# Patient Record
Sex: Male | Born: 1935
Health system: Southern US, Community
[De-identification: ages and names within clinical notes are randomized; demographics above are authoritative.]

## PROBLEM LIST (undated history)

## (undated) DIAGNOSIS — R351 Nocturia: Secondary | ICD-10-CM

## (undated) DIAGNOSIS — N182 Chronic kidney disease, stage 2 (mild): Secondary | ICD-10-CM

## (undated) DIAGNOSIS — N3281 Overactive bladder: Secondary | ICD-10-CM

## (undated) DIAGNOSIS — I219 Acute myocardial infarction, unspecified: Secondary | ICD-10-CM

## (undated) DIAGNOSIS — Z951 Presence of aortocoronary bypass graft: Secondary | ICD-10-CM

## (undated) DIAGNOSIS — N210 Calculus in bladder: Secondary | ICD-10-CM

## (undated) DIAGNOSIS — N35919 Unspecified urethral stricture, male, unspecified site: Secondary | ICD-10-CM

## (undated) DIAGNOSIS — Z8669 Personal history of other diseases of the nervous system and sense organs: Secondary | ICD-10-CM

## (undated) DIAGNOSIS — I4819 Other persistent atrial fibrillation: Secondary | ICD-10-CM

## (undated) DIAGNOSIS — N390 Urinary tract infection, site not specified: Secondary | ICD-10-CM

## (undated) DIAGNOSIS — N200 Calculus of kidney: Secondary | ICD-10-CM

## (undated) DIAGNOSIS — E785 Hyperlipidemia, unspecified: Secondary | ICD-10-CM

## (undated) DIAGNOSIS — N3020 Other chronic cystitis without hematuria: Secondary | ICD-10-CM

## (undated) DIAGNOSIS — D649 Anemia, unspecified: Secondary | ICD-10-CM

## (undated) DIAGNOSIS — I10 Essential (primary) hypertension: Secondary | ICD-10-CM

## (undated) DIAGNOSIS — Z972 Presence of dental prosthetic device (complete) (partial): Secondary | ICD-10-CM

## (undated) DIAGNOSIS — Z9189 Other specified personal risk factors, not elsewhere classified: Secondary | ICD-10-CM

## (undated) DIAGNOSIS — Z87898 Personal history of other specified conditions: Secondary | ICD-10-CM

## (undated) DIAGNOSIS — K219 Gastro-esophageal reflux disease without esophagitis: Secondary | ICD-10-CM

## (undated) DIAGNOSIS — F0390 Unspecified dementia without behavioral disturbance: Secondary | ICD-10-CM

## (undated) DIAGNOSIS — I255 Ischemic cardiomyopathy: Secondary | ICD-10-CM

## (undated) DIAGNOSIS — K5790 Diverticulosis of intestine, part unspecified, without perforation or abscess without bleeding: Secondary | ICD-10-CM

## (undated) DIAGNOSIS — A419 Sepsis, unspecified organism: Secondary | ICD-10-CM

## (undated) DIAGNOSIS — I5022 Chronic systolic (congestive) heart failure: Secondary | ICD-10-CM

## (undated) DIAGNOSIS — Z974 Presence of external hearing-aid: Secondary | ICD-10-CM

## (undated) DIAGNOSIS — C4320 Malignant melanoma of unspecified ear and external auricular canal: Secondary | ICD-10-CM

## (undated) DIAGNOSIS — F03A0 Unspecified dementia, mild, without behavioral disturbance, psychotic disturbance, mood disturbance, and anxiety: Secondary | ICD-10-CM

## (undated) DIAGNOSIS — R001 Bradycardia, unspecified: Secondary | ICD-10-CM

## (undated) DIAGNOSIS — C61 Malignant neoplasm of prostate: Secondary | ICD-10-CM

## (undated) DIAGNOSIS — I2510 Atherosclerotic heart disease of native coronary artery without angina pectoris: Secondary | ICD-10-CM

## (undated) DIAGNOSIS — M1990 Unspecified osteoarthritis, unspecified site: Secondary | ICD-10-CM

## (undated) HISTORY — DX: Other persistent atrial fibrillation: I48.19

## (undated) HISTORY — DX: Sepsis, unspecified organism: A41.9

## (undated) HISTORY — DX: Urinary tract infection, site not specified: N39.0

## (undated) HISTORY — DX: Bradycardia, unspecified: R00.1

## (undated) HISTORY — DX: Atherosclerotic heart disease of native coronary artery without angina pectoris: I25.10

## (undated) HISTORY — PX: SKIN CANCER EXCISION: SHX779

## (undated) HISTORY — PX: CORONARY ARTERY BYPASS GRAFT: SHX141

## (undated) HISTORY — DX: Hyperlipidemia, unspecified: E78.5

## (undated) HISTORY — PX: CARDIOVASCULAR STRESS TEST: SHX262

## (undated) HISTORY — PX: MELANOMA EXCISION: SHX5266

## (undated) HISTORY — DX: Chronic kidney disease, stage 2 (mild): N18.2

## (undated) HISTORY — DX: Essential (primary) hypertension: I10

## (undated) HISTORY — PX: CARDIAC CATHETERIZATION: SHX172

## (undated) HISTORY — DX: Chronic systolic (congestive) heart failure: I50.22

---
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
## 1996-03-28 DIAGNOSIS — C61 Malignant neoplasm of prostate: Secondary | ICD-10-CM

## 1996-03-28 HISTORY — PX: OTHER SURGICAL HISTORY: SHX169

## 1996-03-28 HISTORY — DX: Malignant neoplasm of prostate: C61

## 1997-07-28 ENCOUNTER — Other Ambulatory Visit: Admission: RE | Admit: 1997-07-28 | Discharge: 1997-07-28 | Payer: Self-pay | Admitting: Cardiology

## 2004-02-27 ENCOUNTER — Ambulatory Visit: Payer: Self-pay | Admitting: Internal Medicine

## 2004-08-27 ENCOUNTER — Ambulatory Visit: Payer: Self-pay | Admitting: Internal Medicine

## 2005-02-21 ENCOUNTER — Ambulatory Visit: Payer: Self-pay | Admitting: Internal Medicine

## 2005-02-22 ENCOUNTER — Inpatient Hospital Stay (HOSPITAL_BASED_OUTPATIENT_CLINIC_OR_DEPARTMENT_OTHER): Admission: RE | Admit: 2005-02-22 | Discharge: 2005-02-22 | Payer: Self-pay | Admitting: Cardiology

## 2005-03-28 HISTORY — PX: CATARACT EXTRACTION W/ INTRAOCULAR LENS  IMPLANT, BILATERAL: SHX1307

## 2005-04-26 ENCOUNTER — Ambulatory Visit: Payer: Self-pay | Admitting: Internal Medicine

## 2005-09-01 ENCOUNTER — Ambulatory Visit: Payer: Self-pay | Admitting: Internal Medicine

## 2005-12-22 ENCOUNTER — Ambulatory Visit: Payer: Self-pay | Admitting: Internal Medicine

## 2005-12-23 ENCOUNTER — Inpatient Hospital Stay (HOSPITAL_COMMUNITY): Admission: EM | Admit: 2005-12-23 | Discharge: 2005-12-25 | Payer: Self-pay | Admitting: Emergency Medicine

## 2005-12-23 ENCOUNTER — Encounter (INDEPENDENT_AMBULATORY_CARE_PROVIDER_SITE_OTHER): Payer: Self-pay | Admitting: Specialist

## 2005-12-23 HISTORY — PX: LAPAROSCOPIC CHOLECYSTECTOMY: SUR755

## 2006-10-26 ENCOUNTER — Ambulatory Visit: Payer: Self-pay | Admitting: Internal Medicine

## 2006-10-26 DIAGNOSIS — Z8546 Personal history of malignant neoplasm of prostate: Secondary | ICD-10-CM | POA: Insufficient documentation

## 2006-10-26 DIAGNOSIS — Z87442 Personal history of urinary calculi: Secondary | ICD-10-CM | POA: Insufficient documentation

## 2006-10-26 DIAGNOSIS — I2510 Atherosclerotic heart disease of native coronary artery without angina pectoris: Secondary | ICD-10-CM | POA: Insufficient documentation

## 2006-10-26 DIAGNOSIS — G510 Bell's palsy: Secondary | ICD-10-CM | POA: Insufficient documentation

## 2007-01-01 ENCOUNTER — Ambulatory Visit: Payer: Self-pay | Admitting: Internal Medicine

## 2007-01-01 DIAGNOSIS — R109 Unspecified abdominal pain: Secondary | ICD-10-CM | POA: Insufficient documentation

## 2007-02-07 ENCOUNTER — Ambulatory Visit: Payer: Self-pay | Admitting: Internal Medicine

## 2007-02-07 DIAGNOSIS — M1990 Unspecified osteoarthritis, unspecified site: Secondary | ICD-10-CM | POA: Insufficient documentation

## 2007-02-07 LAB — CONVERTED CEMR LAB
ALT: 18 units/L (ref 0–53)
AST: 27 units/L (ref 0–37)
Albumin: 3.9 g/dL (ref 3.5–5.2)
Alkaline Phosphatase: 69 units/L (ref 39–117)
BUN: 16 mg/dL (ref 6–23)
Basophils Absolute: 0 10*3/uL (ref 0.0–0.1)
Basophils Relative: 0.6 % (ref 0.0–1.0)
Bilirubin, Direct: 0.3 mg/dL (ref 0.0–0.3)
CO2: 29 meq/L (ref 19–32)
Calcium: 9.5 mg/dL (ref 8.4–10.5)
Chloride: 109 meq/L (ref 96–112)
Cholesterol: 157 mg/dL (ref 0–200)
Creatinine, Ser: 1 mg/dL (ref 0.4–1.5)
Eosinophils Absolute: 0.1 10*3/uL (ref 0.0–0.6)
Eosinophils Relative: 1.9 % (ref 0.0–5.0)
GFR calc Af Amer: 93 mL/min
GFR calc non Af Amer: 77 mL/min
Glucose, Bld: 93 mg/dL (ref 70–99)
HCT: 44.7 % (ref 39.0–52.0)
HDL: 45.9 mg/dL (ref >39.0)
Hemoglobin: 15.5 g/dL (ref 13.0–17.0)
LDL Cholesterol: 102 mg/dL — ABNORMAL HIGH (ref 0–99)
Lymphocytes Relative: 16.8 % (ref 12.0–46.0)
MCHC: 34.7 g/dL (ref 30.0–36.0)
MCV: 90 fL (ref 78.0–100.0)
Monocytes Absolute: 0.8 10*3/uL — ABNORMAL HIGH (ref 0.2–0.7)
Monocytes Relative: 12.6 % — ABNORMAL HIGH (ref 3.0–11.0)
Neutro Abs: 4.1 10*3/uL (ref 1.4–7.7)
Neutrophils Relative %: 68.1 % (ref 43.0–77.0)
PSA: 0.01 ng/mL — ABNORMAL LOW (ref 0.10–4.00)
Platelets: 104 10*3/uL — ABNORMAL LOW (ref 150–400)
Potassium: 4.9 meq/L (ref 3.5–5.1)
RBC: 4.97 M/uL (ref 4.22–5.81)
RDW: 13.6 % (ref 11.5–14.6)
Sodium: 145 meq/L (ref 135–145)
TSH: 2.2 microintl units/mL (ref 0.35–5.50)
Total Bilirubin: 1.3 mg/dL — ABNORMAL HIGH (ref 0.3–1.2)
Total CHOL/HDL Ratio: 3.4
Total Protein: 6.4 g/dL (ref 6.0–8.3)
Triglycerides: 44 mg/dL (ref 0–149)
VLDL: 9 mg/dL (ref 0–40)
WBC: 6 10*3/uL (ref 4.5–10.5)

## 2007-02-19 ENCOUNTER — Telehealth: Payer: Self-pay | Admitting: Internal Medicine

## 2007-02-27 ENCOUNTER — Ambulatory Visit: Payer: Self-pay | Admitting: Internal Medicine

## 2007-02-27 DIAGNOSIS — L02619 Cutaneous abscess of unspecified foot: Secondary | ICD-10-CM | POA: Insufficient documentation

## 2007-02-27 DIAGNOSIS — L03119 Cellulitis of unspecified part of limb: Secondary | ICD-10-CM

## 2007-03-05 ENCOUNTER — Telehealth: Payer: Self-pay | Admitting: Internal Medicine

## 2007-03-06 ENCOUNTER — Ambulatory Visit: Payer: Self-pay | Admitting: Internal Medicine

## 2007-03-19 ENCOUNTER — Telehealth (INDEPENDENT_AMBULATORY_CARE_PROVIDER_SITE_OTHER): Payer: Self-pay | Admitting: *Deleted

## 2007-03-30 ENCOUNTER — Encounter: Payer: Self-pay | Admitting: Internal Medicine

## 2007-06-19 ENCOUNTER — Ambulatory Visit: Payer: Self-pay | Admitting: Internal Medicine

## 2007-10-10 ENCOUNTER — Encounter: Payer: Self-pay | Admitting: Internal Medicine

## 2007-10-18 ENCOUNTER — Ambulatory Visit: Payer: Self-pay | Admitting: Internal Medicine

## 2007-10-18 DIAGNOSIS — I10 Essential (primary) hypertension: Secondary | ICD-10-CM | POA: Insufficient documentation

## 2007-10-18 DIAGNOSIS — E7800 Pure hypercholesterolemia, unspecified: Secondary | ICD-10-CM | POA: Insufficient documentation

## 2009-02-10 ENCOUNTER — Telehealth: Payer: Self-pay | Admitting: Internal Medicine

## 2009-02-10 ENCOUNTER — Ambulatory Visit: Payer: Self-pay | Admitting: Internal Medicine

## 2009-02-10 DIAGNOSIS — R6883 Chills (without fever): Secondary | ICD-10-CM | POA: Insufficient documentation

## 2009-02-10 LAB — CONVERTED CEMR LAB
ALT: 24 units/L (ref 0–53)
AST: 34 units/L (ref 0–37)
Albumin: 4.5 g/dL (ref 3.5–5.2)
Alkaline Phosphatase: 58 units/L (ref 39–117)
BUN: 19 mg/dL (ref 6–23)
Basophils Absolute: 0 10*3/uL (ref 0.0–0.1)
Basophils Relative: 0 % (ref 0–1)
Bilirubin, Direct: 0.2 mg/dL (ref 0.0–0.3)
CO2: 23 meq/L (ref 19–32)
Calcium: 9.4 mg/dL (ref 8.4–10.5)
Chloride: 102 meq/L (ref 96–112)
Creatinine, Ser: 1.04 mg/dL (ref 0.40–1.50)
Eosinophils Absolute: 0.1 10*3/uL (ref 0.0–0.7)
Eosinophils Relative: 1 % (ref 0–5)
Glucose, Bld: 85 mg/dL (ref 70–99)
HCT: 44.6 % (ref 39.0–52.0)
Hemoglobin: 14.8 g/dL (ref 13.0–17.0)
Indirect Bilirubin: 0.7 mg/dL (ref 0.0–0.9)
Lymphocytes Relative: 10 % — ABNORMAL LOW (ref 12–46)
Lymphs Abs: 0.8 10*3/uL (ref 0.7–4.0)
MCHC: 33.2 g/dL (ref 30.0–36.0)
MCV: 90.7 fL (ref 78.0–100.0)
Monocytes Absolute: 1 10*3/uL (ref 0.1–1.0)
Monocytes Relative: 13 % — ABNORMAL HIGH (ref 3–12)
Neutro Abs: 5.9 10*3/uL (ref 1.7–7.7)
Neutrophils Relative %: 76 % (ref 43–77)
Platelets: 98 10*3/uL — ABNORMAL LOW (ref 150–400)
Potassium: 4.3 meq/L (ref 3.5–5.3)
RBC: 4.92 M/uL (ref 4.22–5.81)
RDW: 13.8 % (ref 11.5–15.5)
Sodium: 140 meq/L (ref 135–145)
Total Bilirubin: 0.9 mg/dL (ref 0.3–1.2)
Total Protein: 6.9 g/dL (ref 6.0–8.3)
WBC: 7.8 10*3/uL (ref 4.0–10.5)

## 2009-02-11 ENCOUNTER — Telehealth: Payer: Self-pay | Admitting: Internal Medicine

## 2009-02-11 ENCOUNTER — Encounter: Payer: Self-pay | Admitting: Internal Medicine

## 2009-09-04 ENCOUNTER — Ambulatory Visit: Payer: Self-pay | Admitting: Internal Medicine

## 2009-09-04 DIAGNOSIS — R071 Chest pain on breathing: Secondary | ICD-10-CM | POA: Insufficient documentation

## 2010-03-31 ENCOUNTER — Ambulatory Visit: Payer: Self-pay | Admitting: Cardiology

## 2010-04-18 ENCOUNTER — Encounter: Payer: Self-pay | Admitting: Cardiology

## 2010-04-29 ENCOUNTER — Ambulatory Visit (INDEPENDENT_AMBULATORY_CARE_PROVIDER_SITE_OTHER): Payer: Medicare Other | Admitting: *Deleted

## 2010-04-29 DIAGNOSIS — I2510 Atherosclerotic heart disease of native coronary artery without angina pectoris: Secondary | ICD-10-CM

## 2010-04-29 NOTE — Assessment & Plan Note (Signed)
Summary: chills/body aches/throbbing in head/cjr   Vital Signs:  Patient profile:   77 year old male Weight:      164 pounds BMI:     26.57 Temp:     98.3 degrees F oral Pulse rate:   60 / minute Pulse rhythm:   regular BP sitting:   120 / 68  (left arm) Cuff size:   regular  Vitals Entered By: Chipper Oman, RN (February 10, 2009 2:41 PM) CC: Episode of chills, head throbbing, violent shaking and weakness last night. Wife witnessed, States had episode like this before. Today weak, sore all over and head hurts.   CC:  Episode of chills, head throbbing, violent shaking and weakness last night. Wife witnessed, States had episode like this before. Today weak, and sore all over and head hurts..  History of Present Illness: 77 year old patient has a history of coronary artery disease, who experienced an episode of severe rigors last night that lasted about one hour.  Temperature was only 99 degrees today.  He complains of some weakness and fairly mild myalgias, but otherwise no complaints.  He had no further documented fever or chills.  Denies any cough or shortness of breath.  He states at approximately 1 month ago.  He had a similar episode of chills.  Denies any chest pain.  He remains on Crestor for dyslipidemia.  Allergies: 1)  ! Cipro 2)  ! Sulfa  Past History:  Past Medical History: Reviewed history from 02/07/2007 and no changes required. Prostate cancer, hx of 1998 Coronary artery disease status post coronary bypass grafting, 1989, 1998 DJD Hypercholesterolemia Nephrolithiasis, hx of Osteoarthritis  Past Surgical History: Reviewed history from 06/19/2007 and no changes required. Coronary artery bypass graft-1989, 1998 status post resection melanoma of the year status post laparoscopic cholecystectomy September 2007 Cataract extraction cardiac catheterization in November 2006 sigmoidoscopy 2006  Review of Systems       The patient complains of fever and muscle  weakness.  The patient denies anorexia, weight loss, weight gain, vision loss, decreased hearing, hoarseness, chest pain, syncope, dyspnea on exertion, peripheral edema, prolonged cough, headaches, hemoptysis, abdominal pain, melena, hematochezia, severe indigestion/heartburn, hematuria, incontinence, genital sores, suspicious skin lesions, transient blindness, difficulty walking, depression, unusual weight change, abnormal bleeding, enlarged lymph nodes, angioedema, breast masses, and testicular masses.    Physical Exam  General:  Well-developed,well-nourished,in no acute distress; alert,appropriate and cooperative throughout examination; 110/70 Head:  Normocephalic and atraumatic without obvious abnormalities. No apparent alopecia or balding. Eyes:  No corneal or conjunctival inflammation noted. EOMI. Perrla. Funduscopic exam benign, without hemorrhages, exudates or papilledema. Vision grossly normal. Ears:  External ear exam shows no significant lesions or deformities.  Otoscopic examination reveals clear canals, tympanic membranes are intact bilaterally without bulging, retraction, inflammation or discharge. Hearing is grossly normal bilaterally. Mouth:  Oral mucosa and oropharynx without lesions or exudates.  Teeth in good repair. Neck:  No deformities, masses, or tenderness noted. Chest Wall:  sternotomy scar Lungs:  Normal respiratory effort, chest expands symmetrically. Lungs are clear to auscultation, no crackles or wheezes O2 saturation 97% Heart:  Normal rate and regular rhythm. S1 and S2 normal without gallop, murmur, click, rub or other extra sounds. pulse rate 54 Abdomen:  Bowel sounds positive,abdomen soft and non-tender without masses, organomegaly or hernias noted. Msk:  No deformity or scoliosis noted of thoracic or lumbar spine.   Pulses:  R and L carotid,radial,femoral,dorsalis pedis and posterior tibial pulses are full and equal bilaterally Extremities:  No  clubbing, cyanosis,  edema, or deformity noted with normal full range of motion of all joints.     Impression & Recommendations:  Problem # 1:  CHILLS WITHOUT FEVER (ICD-780.64)  Orders: Venipuncture [id number]) TLB-BMP (Basic Metabolic Panel-BMET) (99991111) TLB-CBC Platelet - w/Differential (85025-CBCD) TLB-Hepatic/Liver Function Pnl (80076-HEPATIC) T-Culture, Blood Routine [id number]) UA Dipstick W/ Micro (manual) (81000) T-2 View CXR (71020TC)  Problem # 2:  HYPERTENSION NEC (ICD-997.91)  Orders: Venipuncture [id number]) TLB-BMP (Basic Metabolic Panel-BMET) (99991111) TLB-CBC Platelet - w/Differential (85025-CBCD) TLB-Hepatic/Liver Function Pnl (80076-HEPATIC)  Problem # 3:  HYPERCHOLESTEROLEMIA (ICD-272.0)  His updated medication list for this problem includes:    Crestor 10 Mg Tabs (Rosuvastatin calcium) .Marland Kitchen... 1 once daily  Orders: Venipuncture [id number]) TLB-BMP (Basic Metabolic Panel-BMET) (99991111) TLB-CBC Platelet - w/Differential (85025-CBCD) TLB-Hepatic/Liver Function Pnl (80076-HEPATIC)  Complete Medication List: 1)  Lisinopril 20 Mg Tabs (Lisinopril) .... Take 1 tablet by mouth once a day 2)  Imdur 30 Mg Tb24 (Isosorbide mononitrate) .... Take 1 tablet by mouth once a day 3)  Galantine Hydrobromide 16mg   .... 1 once daily 4)  [facility]vs Omeprazole 20 Mg Tbec (Omeprazole) .Marland Kitchen.. 1 once daily 5)  Hydrocodone-acetaminophen 5-500 Mg Tabs (Hydrocodone-acetaminophen) .... One every 6 hrs as needed for pain 6)  Buffered Aspirin 325 Mg Tabs (Aspirin buf(cacarb-mgcarb-mgo)) .Marland Kitchen.. 1 once daily 7)  Crestor 10 Mg Tabs (Rosuvastatin calcium) .Marland Kitchen.. 1 once daily  Patient Instructions: 1)  Take 650-1000mg  of Tylenol every 4-6 hours as needed for relief of pain or comfort of fever AVOID taking more than 4000mg   in a 24 hour period (can cause liver damage in higher doses). 2)  call if there are any further episodes of fever or chills Prescriptions: CRESTOR 10 MG  TABS (ROSUVASTATIN CALCIUM) 1 once  daily  #90 x 6   Entered and Authorized by:   Marletta Lor  MD   Signed by:   Marletta Lor  MD on 02/10/2009   Method used:   Print then Give to Patient   RxID:   [id number] HYDROCODONE-ACETAMINOPHEN 5-500 MG  TABS (HYDROCODONE-ACETAMINOPHEN) one every 6 hrs as needed for pain  #50 x 0   Entered and Authorized by:   Marletta Lor  MD   Signed by:   Marletta Lor  MD on 02/10/2009   Method used:   Print then Give to Patient   RxID:   [id number] [facility] OMEPRAZOLE 20 MG  TBEC (OMEPRAZOLE) 1 once daily  #90 x 6   Entered and Authorized by:   Marletta Lor  MD   Signed by:   Marletta Lor  MD on 02/10/2009   Method used:   Print then Give to Patient   RxID:   [id number] IMDUR 30 MG  TB24 (ISOSORBIDE MONONITRATE) Take 1 tablet by mouth once a day  #90 x 6   Entered and Authorized by:   Marletta Lor  MD   Signed by:   Marletta Lor  MD on 02/10/2009   Method used:   Print then Give to Patient   RxID:   [id number] LISINOPRIL 20 MG  TABS (LISINOPRIL) Take 1 tablet by mouth once a day  #90 x 6   Entered and Authorized by:   Marletta Lor  MD   Signed by:   Marletta Lor  MD on 02/10/2009   Method used:   Print then Give to Patient   RxID:   [id number]   Appended Document: chills/body aches/throbbing in head/cjr  Laboratory  Results   Urine Tests    Routine Urinalysis   Color: yellow Appearance: Clear Glucose: negative   (Normal Range: Negative) Bilirubin: negative   (Normal Range: Negative) Ketone: 1+   (Normal Range: Negative) Spec. Gravity: 1.020   (Normal Range: 1.003-1.035) Blood: trace-intact   (Normal Range: Negative) pH: 5.5   (Normal Range: 5.0-8.0) Protein: negative   (Normal Range: Negative) Urobilinogen: 1.0   (Normal Range: 0-1) Nitrite: negative   (Normal Range: Negative) Leukocyte Esterace: 2+   (Normal Range: Negative)    Comments: ? if enough to culture. Irish Elders  CMA  February 10, 2009 4:26 PM

## 2010-04-29 NOTE — Assessment & Plan Note (Signed)
Summary: CPX//SAH   Vital Signs:  Patient Profile:   77 Years Old Male Height:     66 inches Weight:      166 pounds BMI:     26.89 Temp:     97.6 degrees F oral BP sitting:   122 / 66  (left arm)  Vitals Entered By: Chipper Oman, RN (February 07, 2007 9:31 AM)                 Chief Complaint:  OV and fasting. Sees cardiologist..  History of Present Illness: 77 year old patient seen today for an annual exam clinically doing quite well.  Has a history of coronary artery disease, followed by Dr. tenet.  No cardiopulmonary complaints  Current Allergies: ! CIPRO ! SULFA  Past Medical History:    Prostate cancer, hx of 1998    Coronary artery disease status post coronary bypass grafting, 1989, 1998    DJD    Hypercholesterolemia    Nephrolithiasis, hx of    Osteoarthritis  Past Surgical History:    Coronary artery bypass graft-1989, 1998    status post resection melanoma of the year    status post laparoscopic cholecystectomy September 2007    Cataract extraction   Family History:    Family History Diabetes 1st degree relative    Fam hx MI    Fam hx CAD    father died age 35.  History tuberculosis and diabetes    mother died age 76 on artery disease, diabetes        One brother died age 42 of an MI    4 sisters positive for coronary disease    Review of Systems       sigmoidoscopy 2006 Cardiolite  stress test November 2005 heart catheterization November 2006   Physical Exam  General:     Well-developed,well-nourished,in no acute distress; alert,appropriate and cooperative throughout examination Head:     Normocephalic and atraumatic without obvious abnormalities. No apparent alopecia or balding. Eyes:     No corneal or conjunctival inflammation noted. EOMI. Perrla. Funduscopic exam benign, without hemorrhages, exudates or papilledema. Vision grossly normal. Ears:     hearing aid in place on the right Nose:     External nasal examination shows no  deformity or inflammation. Nasal mucosa are pink and moist without lesions or exudates. Mouth:     Oral mucosa and oropharynx without lesions or exudates.  Teeth in good repair. Neck:     No deformities, masses, or tenderness noted. Chest Wall:     No deformities, masses, tenderness or gynecomastia noted. Breasts:     No masses or gynecomastia noted Lungs:     Normal respiratory effort, chest expands symmetrically. Lungs are clear to auscultation, no crackles or wheezes. Heart:     Normal rate and regular rhythm. S1 and S2 normal without gallop, murmur, click, rub or other extra sounds. Abdomen:     Bowel sounds positive,abdomen soft and non-tender without masses, organomegaly or hernias noted. Rectal:     No external abnormalities noted. Normal sphincter tone. No rectal masses or tenderness. Genitalia:     Testes bilaterally descended without nodularity, tenderness or masses. No scrotal masses or lesions. No penis lesions or urethral discharge. Prostate:     plus one Msk:     No deformity or scoliosis noted of thoracic or lumbar spine.   Pulses:     R and L carotid,radial,femoral,dorsalis pedis and posterior tibial pulses are full and equal bilaterally Extremities:  No clubbing, cyanosis, edema, or deformity noted with normal full range of motion of all joints.   Neurologic:     No cranial nerve deficits noted. Station and gait are normal. Plantar reflexes are down-going bilaterally. DTRs are symmetrical throughout. Sensory, motor and coordinative functions appear intact. Skin:     Intact without suspicious lesions or rashes Cervical Nodes:     No lymphadenopathy noted Axillary Nodes:     No palpable lymphadenopathy Inguinal Nodes:     No significant adenopathy Psych:     Cognition and judgment appear intact. Alert and cooperative with normal attention span and concentration. No apparent delusions, illusions, hallucinations    Impression & Recommendations:  Problem #  1:  OSTEOARTHRITIS (ICD-715.90)  His updated medication list for this problem includes:    Hydrocodone-acetaminophen 5-500 Mg Tabs (Hydrocodone-acetaminophen) ..... One every 6 hrs as needed for pain    Buffered Aspirin 325 Mg Tabs (Aspirin buf(cacarb-mgcarb-mgo)) .Marland Kitchen... 1 once daily   Problem # 2:  BELL'S PALSY, RIGHT (ICD-351.0)  Problem # 3:  PROSTATE CANCER, HX OF (ICD-V10.46)  Orders: DRE [id number]) TLB-PSA (Prostate Specific Antigen) (84153-PSA)   Problem # 4:  CORONARY ARTERY DISEASE (ICD-414.00)  His updated medication list for this problem includes:    Lisinopril 20 Mg Tabs (Lisinopril) .Marland Kitchen... Take 1 tablet by mouth once a day    Imdur 30 Mg Tb24 (Isosorbide mononitrate) .Marland Kitchen... Take 1 tablet by mouth once a day    Buffered Aspirin 325 Mg Tabs (Aspirin buf(cacarb-mgcarb-mgo)) .Marland Kitchen... 1 once daily  Orders: Venipuncture [id number]) TLB-Lipid Panel (80061-LIPID) TLB-BMP (Basic Metabolic Panel-BMET) (99991111) TLB-CBC Platelet - w/Differential (85025-CBCD) TLB-Hepatic/Liver Function Pnl (80076-HEPATIC) TLB-TSH (Thyroid Stimulating Hormone) (84443-TSH)   Complete Medication List: 1)  Lisinopril 20 Mg Tabs (Lisinopril) .... Take 1 tablet by mouth once a day 2)  Imdur 30 Mg Tb24 (Isosorbide mononitrate) .... Take 1 tablet by mouth once a day 3)  Galantine Hydrobromide 16mg   .... 1 once daily 4)  [facility]vs Omeprazole 20 Mg Tbec (Omeprazole) .Marland Kitchen.. 1 once daily 5)  Crestor 5 Mg Tabs (Rosuvastatin calcium) .... One daily 6)  Hydrocodone-acetaminophen 5-500 Mg Tabs (Hydrocodone-acetaminophen) .... One every 6 hrs as needed for pain 7)  Buffered Aspirin 325 Mg Tabs (Aspirin buf(cacarb-mgcarb-mgo)) .Marland Kitchen.. 1 once daily 8)  Mag-oxide 400 Mg Tabs (Magnesium oxide) .... One daily as needed   Patient Instructions: 1)  Please schedule a follow-up appointment in 4 months. 2)  Limit your Sodium (Salt). 3)  It is important that you exercise regularly at least 20 minutes 5 times a week. If you develop  chest pain, have severe difficulty breathing, or feel very tired , stop exercising immediately and seek medical attention.    Prescriptions: MAG-OXIDE 400 MG  TABS (MAGNESIUM OXIDE) one daily as needed  #90 x 6   Entered and Authorized by:   Marletta Lor  MD   Signed by:   Marletta Lor  MD on 02/07/2007   Method used:   Print then Give to Patient   RxID:   [id number] CRESTOR 5 MG  TABS (ROSUVASTATIN CALCIUM) one daily  #90 x 6   Entered and Authorized by:   Marletta Lor  MD   Signed by:   Marletta Lor  MD on 02/07/2007   Method used:   Print then Give to Patient   RxID:   [id number] [facility] OMEPRAZOLE 20 MG  TBEC (OMEPRAZOLE) 1 once daily  #90 x 6   Entered and Authorized by:  Marletta Lor  MD   Signed by:   Marletta Lor  MD on 02/07/2007   Method used:   Print then Give to Patient   RxID:   [id number] IMDUR 30 MG  TB24 (ISOSORBIDE MONONITRATE) Take 1 tablet by mouth once a day  #90 x 6   Entered and Authorized by:   Marletta Lor  MD   Signed by:   Marletta Lor  MD on 02/07/2007   Method used:   Print then Give to Patient   RxID:   [id number] LISINOPRIL 20 MG  TABS (LISINOPRIL) Take 1 tablet by mouth once a day  #90 x 6   Entered and Authorized by:   Marletta Lor  MD   Signed by:   Marletta Lor  MD on 02/07/2007   Method used:   Print then Give to Patient   RxID:   [id number]  ]

## 2010-04-29 NOTE — Progress Notes (Signed)
Summary: Pt called req results from labs, specimen and xray  Phone Note Call from Patient Call back at Home Phone [phone]   Caller: Patient Summary of Call: Pt called and is req results from labs,specimen and xray. Please call asap.  Initial call taken by: Braulio Bosch,  February 11, 2009 3:54 PM  Follow-up for Phone Call        called Follow-up by: Marletta Lor  MD,  February 12, 2009 1:10 PM

## 2010-04-29 NOTE — Progress Notes (Signed)
Summary: Sent pt. to [facility] for labs  Phone Note Other Incoming   Summary of Call: Sentpt. to [facility] to have all labs drawn, pt. was dehydrated and twice unsuccessful blood draw. Gave instructions and numbers, [facility] is open until 8pm, pt. and wife will go to [facility] for X-ray first. Informed to call with any questions or concerns. Irish Elders CMA  February 10, 2009 4:18 PM   Follow-up for Phone Call        noted Follow-up by: Marletta Lor  MD,  February 10, 2009 5:23 PM

## 2010-04-29 NOTE — Assessment & Plan Note (Signed)
Summary: FUP ON BELL'S PALSY/CCM   Vital Signs:  Patient Profile:   77 Years Old Male Weight:      169 pounds Temp:     97.9 degrees F oral BP sitting:   110 / 80  (left arm)  Vitals Entered By: Chipper Oman, RN (October 26, 2006 1:04 PM)               Chief Complaint:  ROV. Was seen at Urgent Care for Bell's palsy. Took Prednisone. Face still numb.Marland Kitchen  History of Present Illness: 77 year old patient seen today for follow-up.  He was treated for a Bell's palsy and the urgent care Center recently has completed a course of prednisone.  He states he present with some right ear discomfort and some excessive tearing involve the right eye.  Denies any facial weakness.  At this time.  Patient had the surgery for acute cholecystitis in September of last year.  He has done well and is not any difficulty since that time since his gallbladder surgery.  He has done well off his omeprazole and has had no significant reflux symptoms  Current Allergies: ! CIPRO ! SULFA  Past Medical History:    Reviewed history from 10/26/2006 and no changes required:       Prostate cancer, hx of       Coronary artery disease       DJD       Hypercholesterolemia       Nephrolithiasis, hx of   Family History:    Reviewed history from 10/26/2006 and no changes required:       Family History Diabetes 1st degree relative       Fam hx MI       Fam hx CAD    Review of Systems  The patient denies anorexia, fever, weight loss, vision loss, decreased hearing, hoarseness, chest pain, syncope, dyspnea on exhertion, peripheral edema, prolonged cough, hemoptysis, abdominal pain, melena, hematochezia, severe indigestion/heartburn, hematuria, incontinence, genital sores, muscle weakness, suspicious skin lesions, transient blindness, difficulty walking, depression, unusual weight change, abnormal bleeding, enlarged lymph nodes, angioedema, breast masses, and testicular masses.     Physical Exam  General:  Well-developed,well-nourished,in no acute distress; alert,appropriate and cooperative throughout examination Head:     Normocephalic and atraumatic without obvious abnormalities. No apparent alopecia or balding. Eyes:     No corneal or conjunctival inflammation noted. EOMI. Perrla. Funduscopic exam benign, without hemorrhages, exudates or papilledema. Vision grossly normal. Ears:     External ear exam shows no significant lesions or deformities.  Otoscopic examination reveals clear canals, tympanic membranes are intact bilaterally without bulging, retraction, inflammation or discharge. Hearing is grossly normal bilaterally. Mouth:     Oral mucosa and oropharynx without lesions or exudates.  Teeth in good repair. Neck:     No deformities, masses, or tenderness noted. Lungs:     Normal respiratory effort, chest expands symmetrically. Lungs are clear to auscultation, no crackles or wheezes. Heart:     Normal rate and regular rhythm. S1 and S2 normal without gallop, murmur, click, rub or other extra sounds. Abdomen:     Bowel sounds positive,abdomen soft and non-tender without masses, organomegaly or hernias noted. Msk:     No deformity or scoliosis noted of thoracic or lumbar spine.   Pulses:     R and L carotid,radial,femoral,dorsalis pedis and posterior tibial pulses are full and equal bilaterally Extremities:     No clubbing, cyanosis, edema, or deformity noted with normal full range  of motion of all joints.   Neurologic:     patient had no facial asymmetry.  he was able to blink normal bilaterally.  Is able to wrinkle his for it without difficulty Skin:     Intact without suspicious lesions or rashes    Impression & Recommendations:  Problem # 1:  BELL'S PALSY, RIGHT (ICD-351.0) apparent full clinical recovery  Problem # 2:  CORONARY ARTERY DISEASE (ICD-414.00)  His updated medication list for this problem includes:    Lisinopril 20 Mg Tabs (Lisinopril) .Marland Kitchen... Take 1 tablet by  mouth once a day    Imdur 30 Mg Tb24 (Isosorbide mononitrate) .Marland Kitchen... Take 1 tablet by mouth once a day   Problem # 3:  PROSTATE CANCER, HX OF (ICD-V10.46)  Problem # 4:  NEPHROLITHIASIS, HX OF (ICD-V13.01)  Complete Medication List: 1)  Lipitor 40 Mg Tabs (Atorvastatin calcium) .... Take 1 tablet by mouth once a day 2)  Lisinopril 20 Mg Tabs (Lisinopril) .... Take 1 tablet by mouth once a day 3)  Imdur 30 Mg Tb24 (Isosorbide mononitrate) .... Take 1 tablet by mouth once a day 4)  Galantine Hydrobromide 16mg     Patient Instructions: 1)  Please schedule a follow-up appointment in 3 months for a complete exam          Appended Document: FUP ON BELL'S PALSY/CCM   Tetanus/Td Immunization History:    Tetanus/Td # 1:  Td (10/26/2006)

## 2010-04-29 NOTE — Progress Notes (Signed)
Summary: results of blood test  Phone Note Call from Patient   Caller: patient (live) Call For: k Summary of Call: reuslts of blood test  Initial call taken by: Shanon Payor,  February 19, 2007 8:50 AM  Follow-up for Phone Call        Phone Call Completed Follow-up by: Chipper Oman, RN,  February 19, 2007 9:35 AM

## 2010-04-29 NOTE — Assessment & Plan Note (Signed)
Summary: foot pain/ccm   Vital Signs:  Patient Profile:   77 Years Old Male Height:     66 inches Weight:      167 pounds Temp:     97.8 degrees F oral BP sitting:   100 / 64  (left arm)  Vitals Entered By: Chipper Oman, RN (February 27, 2007 9:57 AM)                 Chief Complaint:  C/o L foot pain and swelling since Friday.Marland Kitchen  History of Present Illness: 77 year old, white male presents with a 4-day history of increasing redness, pain and swelling about the dorsal aspect of his left foot.  Pain is constant and bothers him throughout the night.  He has noted increasing swelling today.  Denies any fever or chills  Current Allergies: ! CIPRO ! SULFA      Physical Exam  Msk:     there is erythema, excessive warmth, and soft tissue swelling involving the dorsal aspect of his left foot, plantar flexion of the toes did not tend to aggravate the pain and the pain did not seem localized to the MTP joints    Impression & Recommendations:  Problem # 1:  CELLULITIS, FOOT, LEFT (ICD-682.7)  His updated medication list for this problem includes:    Cephalexin 500 Mg Caps (Cephalexin) ..... One twice daily   Complete Medication List: 1)  Lisinopril 20 Mg Tabs (Lisinopril) .... Take 1 tablet by mouth once a day 2)  Imdur 30 Mg Tb24 (Isosorbide mononitrate) .... Take 1 tablet by mouth once a day 3)  Galantine Hydrobromide 16mg   .... 1 once daily 4)  [facility]vs Omeprazole 20 Mg Tbec (Omeprazole) .Marland Kitchen.. 1 once daily 5)  Crestor 5 Mg Tabs (Rosuvastatin calcium) .... One daily 6)  Hydrocodone-acetaminophen 5-500 Mg Tabs (Hydrocodone-acetaminophen) .... One every 6 hrs as needed for pain 7)  Buffered Aspirin 325 Mg Tabs (Aspirin buf(cacarb-mgcarb-mgo)) .Marland Kitchen.. 1 once daily 8)  Mag-oxide 400 Mg Tabs (Magnesium oxide) .... One daily as needed 9)  Cephalexin 500 Mg Caps (Cephalexin) .... One twice daily   Patient Instructions: 1)  keep left leg elevated as much as possible 2)  call if   any worsening pain, swelling, or redness    Prescriptions: CEPHALEXIN 500 MG  CAPS (CEPHALEXIN) one twice daily  #14 x 0   Entered and Authorized by:   Marletta Lor  MD   Signed by:   Marletta Lor  MD on 02/27/2007   Method used:   Print then Give to Patient   RxID:   [id number]  ]

## 2010-04-29 NOTE — Assessment & Plan Note (Signed)
Summary: shoulder pain/dm   Vital Signs:  Patient profile:   77 year old male Weight:      163 pounds Temp:     98.1 degrees F oral BP sitting:   100 / 62  (left arm) Cuff size:   regular  Vitals Entered By: Cay Schillings LPN (June 10, [id number] AM) CC: c/o (L) neck and shoulder pain Is Patient Diabetic? No   CC:  c/o (L) neck and shoulder pain.  History of Present Illness: 77 year old patient who is seen today for follow-up.  He has hypercholesterolemia, coronary artery disease, and hypertension.  One week ago he fell, sustaining trauma to his left shoulder region.  He was quite uncomfortable for a while but now is having only minor pain.  Pain is aggravated by movement of the left shoulder and deep inspiration.  He has been using Tylenol as needed.  His cardiac status has been quite stable.  He is followed at the [facility] and they  refill his medications.  he remains on Crestor, which he continues to tolerate well.  He denies any exertional chest pain and remains quite active physically  Preventive Screening-Counseling & Management  Alcohol-Tobacco     Smoking Status: quit  Allergies: 1)  ! Cipro 2)  ! Sulfa  Past History:  Past Medical History: Reviewed history from 02/07/2007 and no changes required. Prostate cancer, hx of 1998 Coronary artery disease status post coronary bypass grafting, 1989, 1998 DJD Hypercholesterolemia Nephrolithiasis, hx of Osteoarthritis  Review of Systems       The patient complains of chest pain.  The patient denies anorexia, fever, weight loss, weight gain, vision loss, decreased hearing, hoarseness, syncope, dyspnea on exertion, peripheral edema, prolonged cough, headaches, hemoptysis, abdominal pain, melena, hematochezia, severe indigestion/heartburn, hematuria, incontinence, genital sores, muscle weakness, suspicious skin lesions, transient blindness, difficulty walking, depression, unusual weight change, abnormal bleeding,  enlarged lymph nodes, angioedema, breast masses, and testicular masses.         left upper anterior chest pain, secondary to recent trauma associated with pain in the left shoulder with movement  Physical Exam  General:  Well-developed,well-nourished,in no acute distress; alert,appropriate and cooperative throughout examination; blood pressure 100/62 Head:  Normocephalic and atraumatic without obvious abnormalities. No apparent alopecia or balding. Eyes:  No corneal or conjunctival inflammation noted. EOMI. Perrla. Funduscopic exam benign, without hemorrhages, exudates or papilledema. Vision grossly normal. Ears:  External ear exam shows no significant lesions or deformities.  Otoscopic examination reveals clear canals, tympanic membranes are intact bilaterally without bulging, retraction, inflammation or discharge. Hearing is grossly normal bilaterally. Mouth:  Oral mucosa and oropharynx without lesions or exudates.  Teeth in good repair. Neck:  No deformities, masses, or tenderness noted. Chest Wall:  slight tenderness over the left anterior chest wall to palpation Lungs:  Normal respiratory effort, chest expands symmetrically. Lungs are clear to auscultation, no crackles or wheezes. Heart:  Normal rate and regular rhythm. S1 and S2 normal without gallop, murmur, click, rub or other extra sounds. Abdomen:  Bowel sounds positive,abdomen soft and non-tender without masses, organomegaly or hernias noted. Msk:  No deformity or scoliosis noted of thoracic or lumbar spine.   range of motion of left shoulder intact Pulses:  R and L carotid,radial,femoral,dorsalis pedis and posterior tibial pulses are full and equal bilaterally Extremities:  No clubbing, cyanosis, edema, or deformity noted with normal full range of motion of all joints.     Impression & Recommendations:  Problem # 1:  HYPERTENSION NEC (  [id number])  Problem # 2:  HYPERCHOLESTEROLEMIA (ICD-272.0)  His updated medication list for  this problem includes:    Crestor 10 Mg Tabs (Rosuvastatin calcium) .Marland Kitchen... 1 once daily  His updated medication list for this problem includes:    Crestor 10 Mg Tabs (Rosuvastatin calcium) .Marland Kitchen... 1 once daily  Complete Medication List: 1)  Lisinopril 20 Mg Tabs (Lisinopril) .... Take 1 tablet by mouth once a day 2)  Imdur 30 Mg Tb24 (Isosorbide mononitrate) .... Take 1 tablet by mouth once a day 3)  Galantine Hydrobromide 16mg   .... 1 once daily 4)  Buffered Aspirin 325 Mg Tabs (Aspirin buf(cacarb-mgcarb-mgo)) .Marland Kitchen.. 1 once daily 5)  Crestor 10 Mg Tabs (Rosuvastatin calcium) .Marland Kitchen.. 1 once daily  Patient Instructions: 1)  Please schedule a follow-up appointment in 6 months. 2)  Advised not to eat any food or drink any liquids after 10 PM the night before your procedure. 3)  Limit your Sodium (Salt) to less than 2 grams a day(slightly less than 1/2 a teaspoon) to prevent fluid retention, swelling, or worsening of symptoms. 4)  It is important that you exercise regularly at least 20 minutes 5 times a week. If you develop chest pain, have severe difficulty breathing, or feel very tired , stop exercising immediately and seek medical attention.

## 2010-04-29 NOTE — Progress Notes (Signed)
Summary: what to do about foot   Phone Note Call from Patient Call back at Home Phone [phone]   Caller: patient live Call For: k Summary of Call: foot is not better.  The meds you gave him last time made him feel worse than his foot so he did not take the meds.  The swelling has gone up his leg to his knee.  What do you suggest  Initial call taken by: Shanon Payor,  March 19, 2007 3:36 PM  Follow-up for Phone Call        Venous doppler exam this am and rov this PM  Additional Follow-up for Phone Call Additional follow up Details #1::        Pomaria booked for today/will send to [facility] Additional Follow-up by: Rolinda Roan,  March 21, 2007 8:57 AM    schedule venous doppler eval lower extremities 12-23 and ROV this week

## 2010-04-29 NOTE — Assessment & Plan Note (Signed)
Summary: 4 month f/up//db   Vital Signs:  Patient Profile:   77 Years Old Male Height:     66 inches Weight:      171 pounds Temp:     98.1 degrees F oral BP sitting:   120 / 60  (left arm) Cuff size:   regular  Vitals Entered By: Chipper Oman, RN (June 19, 2007 9:04 AM)                 Chief Complaint:  4 mo ROV. FBS 97. Had cataract sx ou.Marland Kitchen  History of Present Illness: a 77 year old patient seen today for follow-up of his coronary artery disease, hypertension, dyslipidemia, he has done well, remains quite active.  No cardiopulmonary complaints.  His last stress test was in 2005 and did a heart catheterization in 2006    Current Allergies: ! CIPRO ! SULFA  Past Surgical History:    Reviewed history from 02/07/2007 and no changes required:       Coronary artery bypass graft-1989, 1998       status post resection melanoma of the year       status post laparoscopic cholecystectomy September 2007       Cataract extraction       cardiac catheterization in November 2006       sigmoidoscopy 2006   Family History:    Reviewed history from 02/07/2007 and no changes required:       Family History Diabetes 1st degree relative       Fam hx MI       Fam hx CAD       father died age 45.  History tuberculosis and diabetes       mother died age 61 on artery disease, diabetes              One brother died age 21 of an MI       4 sisters positive for coronary disease    Review of Systems  The patient denies anorexia, fever, weight loss, weight gain, vision loss, decreased hearing, hoarseness, chest pain, syncope, dyspnea on exhertion, peripheral edema, prolonged cough, hemoptysis, abdominal pain, melena, hematochezia, severe indigestion/heartburn, hematuria, incontinence, genital sores, muscle weakness, suspicious skin lesions, transient blindness, difficulty walking, depression, unusual weight change, abnormal bleeding, enlarged lymph nodes, angioedema, breast masses, and  testicular masses.         [facility]   Physical Exam  General:     overweight-appearing.   Head:     Normocephalic and atraumatic without obvious abnormalities. No apparent alopecia or balding. Eyes:     No corneal or conjunctival inflammation noted. EOMI. Perrla. Funduscopic exam benign, without hemorrhages, exudates or papilledema. Vision grossly normal. Ears:     External ear exam shows no significant lesions or deformities.  Otoscopic examination reveals clear canals, tympanic membranes are intact bilaterally without bulging, retraction, inflammation or discharge. Hearing is grossly normal bilaterally. Mouth:     Oral mucosa and oropharynx without lesions or exudates.  Teeth in good repair. Neck:     No deformities, masses, or tenderness noted. Chest Wall:     No deformities, masses, tenderness or gynecomastia noted. Lungs:     Normal respiratory effort, chest expands symmetrically. Lungs are clear to auscultation, no crackles or wheezes. Heart:     Normal rate and regular rhythm. S1 and S2 normal without gallop, murmur, click, rub or other extra sounds. Abdomen:     Bowel sounds positive,abdomen  soft and non-tender without masses, organomegaly or hernias noted. Msk:     No deformity or scoliosis noted of thoracic or lumbar spine.   Pulses:     R and L carotid,radial,femoral,dorsalis pedis and posterior tibial pulses are full and equal bilaterally Extremities:     No clubbing, cyanosis, edema, or deformity noted with normal full range of motion of all joints.      Impression & Recommendations:  Problem # 1:  CORONARY ARTERY DISEASE (ICD-414.00)  His updated medication list for this problem includes:    Lisinopril 20 Mg Tabs (Lisinopril) .Marland Kitchen... Take 1 tablet by mouth once a day    Imdur 30 Mg Tb24 (Isosorbide mononitrate) .Marland Kitchen... Take 1 tablet by mouth once a day    Buffered Aspirin 325 Mg Tabs (Aspirin buf(cacarb-mgcarb-mgo)) .Marland Kitchen... 1  once daily   Problem # 2:  PROSTATE CANCER, HX OF (ICD-V10.46)  Problem # 3:  OSTEOARTHRITIS (ICD-715.90)  His updated medication list for this problem includes:    Hydrocodone-acetaminophen 5-500 Mg Tabs (Hydrocodone-acetaminophen) ..... One every 6 hrs as needed for pain    Buffered Aspirin 325 Mg Tabs (Aspirin buf(cacarb-mgcarb-mgo)) .Marland Kitchen... 1 once daily   Complete Medication List: 1)  Lisinopril 20 Mg Tabs (Lisinopril) .... Take 1 tablet by mouth once a day 2)  Imdur 30 Mg Tb24 (Isosorbide mononitrate) .... Take 1 tablet by mouth once a day 3)  Galantine Hydrobromide 16mg   .... 1 once daily 4)  [facility] Omeprazole 20 Mg Tbec (Omeprazole) .Marland Kitchen.. 1 once daily 5)  Crestor 5 Mg Tabs (Rosuvastatin calcium) .... One daily 6)  Hydrocodone-acetaminophen 5-500 Mg Tabs (Hydrocodone-acetaminophen) .... One every 6 hrs as needed for pain 7)  Buffered Aspirin 325 Mg Tabs (Aspirin buf(cacarb-mgcarb-mgo)) .Marland Kitchen.. 1 once daily 8)  Mag-oxide 400 Mg Tabs (Magnesium oxide) .... One daily as needed   Patient Instructions: 1)  Please schedule a follow-up appointment in 4 months. 2)  Limit your Sodium (Salt) to less than 2 grams a day(slightly less than 1/2 a teaspoon) to prevent fluid retention, swelling, or worsening of symptoms. 3)  It is important that you exercise regularly at least 20 minutes 5 times a week. If you develop chest pain, have severe difficulty breathing, or feel very tired , stop exercising immediately and seek medical attention. 4)  You need to lose weight. Consider a lower calorie diet and regular exercise.     Prescriptions: MAG-OXIDE 400 MG  TABS (MAGNESIUM OXIDE) one daily as needed  #90 x 6   Entered and Authorized by:   Marletta Lor  MD   Signed by:   Marletta Lor  MD on 06/19/2007   Method used:   Print then Give to Patient   RxID:   [id number] HYDROCODONE-ACETAMINOPHEN 5-500 MG  TABS (HYDROCODONE-ACETAMINOPHEN) one every 6 hrs as needed for pain  #50 x 0    Entered and Authorized by:   Marletta Lor  MD   Signed by:   Marletta Lor  MD on 06/19/2007   Method used:   Print then Give to Patient   RxID:   [id number] CRESTOR 5 MG  TABS (ROSUVASTATIN CALCIUM) one daily  #90 x 6   Entered and Authorized by:   Marletta Lor  MD   Signed by:   Marletta Lor  MD on 06/19/2007   Method used:   Print then Give to Patient   RxID:   [id number] [facility] OMEPRAZOLE 20 MG  TBEC (OMEPRAZOLE) 1 once daily  #90  x 6   Entered and Authorized by:   Marletta Lor  MD   Signed by:   Marletta Lor  MD on 06/19/2007   Method used:   Print then Give to Patient   RxID:   [id number] [place] 16MG  1 once daily  #90 x 6   Entered and Authorized by:   Marletta Lor  MD   Signed by:   Marletta Lor  MD on 06/19/2007   Method used:   Print then Give to Patient   RxID:   [id number] IMDUR 30 MG  TB24 (ISOSORBIDE MONONITRATE) Take 1 tablet by mouth once a day  #90 x 6   Entered and Authorized by:   Marletta Lor  MD   Signed by:   Marletta Lor  MD on 06/19/2007   Method used:   Print then Give to Patient   RxID:   [id number] LISINOPRIL 20 MG  TABS (LISINOPRIL) Take 1 tablet by mouth once a day  #90 x 6   Entered and Authorized by:   Marletta Lor  MD   Signed by:   Marletta Lor  MD on 06/19/2007   Method used:   Print then Give to Patient   [id number]  ]

## 2010-04-29 NOTE — Assessment & Plan Note (Signed)
Summary: 4 month rov/njr   Vital Signs:  Patient Profile:   77 Years Old Male Height:     66 inches Weight:      165 pounds Temp:     98.4 degrees F oral BP sitting:   104 / 62  (left arm) Cuff size:   regular  Vitals Entered By: Chipper Oman, RN (October 18, 2007 8:47 AM)                 Chief Complaint:  ROV; recent stress test..  History of Present Illness:  77 year old patient history of hypertension, dyslipidemia, and coronary artery disease.  He is followed by cardiology and has had a recent stress test.  He is doing  well without concerns or complaints.  He is now on Crestor 10 mg daily and  is also followed at the [facility].    Current Allergies: ! CIPRO ! SULFA  Past Medical History:    Reviewed history from 02/07/2007 and no changes required:       Prostate cancer, hx of 1998       Coronary artery disease status post coronary bypass grafting, 1989, 1998       DJD       Hypercholesterolemia       Nephrolithiasis, hx of       Osteoarthritis     Review of Systems  The patient denies anorexia, fever, weight loss, weight gain, vision loss, decreased hearing, hoarseness, chest pain, syncope, dyspnea on exertion, peripheral edema, prolonged cough, headaches, hemoptysis, abdominal pain, melena, hematochezia, severe indigestion/heartburn, hematuria, incontinence, genital sores, muscle weakness, suspicious skin lesions, transient blindness, difficulty walking, depression, unusual weight change, abnormal bleeding, enlarged lymph nodes, angioedema, breast masses, and testicular masses.     Physical Exam  General:     Well-developed,well-nourished,in no acute distress; alert,appropriate and cooperative throughout examination Head:     Normocephalic and atraumatic without obvious abnormalities. No apparent alopecia or balding. Mouth:     Oral mucosa and oropharynx without lesions or exudates.  Teeth in good repair. Neck:     No deformities, masses, or tenderness  noted. Lungs:     Normal respiratory effort, chest expands symmetrically. Lungs are clear to auscultation, no crackles or wheezes. Heart:     Normal rate and regular rhythm. S1 and S2 normal without gallop, murmur, click, rub or other extra sounds. Abdomen:     Bowel sounds positive,abdomen soft and non-tender without masses, organomegaly or hernias noted. Msk:     No deformity or scoliosis noted of thoracic or lumbar spine.   Pulses:     R and L carotid,radial,femoral,dorsalis pedis and posterior tibial pulses are full and equal bilaterally Extremities:     No clubbing, cyanosis, edema, or deformity noted with normal full range of motion of all joints.      Impression & Recommendations:  Problem # 1:  OSTEOARTHRITIS (ICD-715.90)  His updated medication list for this problem includes:    Hydrocodone-acetaminophen 5-500 Mg Tabs (Hydrocodone-acetaminophen) ..... One every 6 hrs as needed for pain    Buffered Aspirin 325 Mg Tabs (Aspirin buf(cacarb-mgcarb-mgo)) .Marland Kitchen... 1 once daily   Problem # 2:  CORONARY ARTERY DISEASE (ICD-414.00)  His updated medication list for this problem includes:    Lisinopril 20 Mg Tabs (Lisinopril) .Marland Kitchen... Take 1 tablet by mouth once a day    Imdur 30 Mg Tb24 (Isosorbide mononitrate) .Marland Kitchen... Take 1 tablet by mouth once a day    Buffered Aspirin 325 Mg Tabs (Aspirin  buf(cacarb-mgcarb-mgo)) .Marland Kitchen... 1 once daily   Problem # 3:  HYPERCHOLESTEROLEMIA (ICD-272.0)  The following medications were removed from the medication list:    Crestor 5 Mg Tabs (Rosuvastatin calcium) ..... One daily  His updated medication list for this problem includes:    Crestor 10 Mg Tabs (Rosuvastatin calcium) .Marland Kitchen... 1 once daily   Complete Medication List: 1)  Lisinopril 20 Mg Tabs (Lisinopril) .... Take 1 tablet by mouth once a day 2)  Imdur 30 Mg Tb24 (Isosorbide mononitrate) .... Take 1 tablet by mouth once a day 3)  Galantine Hydrobromide 16mg   .... 1 once daily 4)  [facility] Omeprazole  20 Mg Tbec (Omeprazole) .Marland Kitchen.. 1 once daily 5)  Hydrocodone-acetaminophen 5-500 Mg Tabs (Hydrocodone-acetaminophen) .... One every 6 hrs as needed for pain 6)  Buffered Aspirin 325 Mg Tabs (Aspirin buf(cacarb-mgcarb-mgo)) .Marland Kitchen.. 1 once daily 7)  Mag-oxide 400 Mg Tabs (Magnesium oxide) .... One daily as needed 8)  Crestor 10 Mg Tabs (Rosuvastatin calcium) .Marland Kitchen.. 1 once daily   Patient Instructions: 1)  Please schedule a follow-up appointment in 6 months. 2)  Limit your Sodium (Salt) to less than 2 grams a day(slightly less than 1/2 a teaspoon) to prevent fluid retention, swelling, or worsening of symptoms. 3)  It is important that you exercise regularly at least 20 minutes 5 times a week. If you develop chest pain, have severe difficulty breathing, or feel very tired , stop exercising immediately and seek medical attention.   ]

## 2010-04-29 NOTE — Progress Notes (Signed)
Summary: med for infected foot not working what to do   Phone Note Call from Patient Call back at [employer] [phone]   Caller: patient live Call For: k Summary of Call: the medicine for the foot is not working too well has stayed off his foot and has two more capsules  do you want him to take another round of meds, come in or try a different med  Initial call taken by: Shanon Payor,  March 05, 2007 3:09 PM  Follow-up for Phone Call        Appt Scheduled Follow-up by: Chipper Oman, RN,  March 05, 2007 5:22 PM    ROV [place]

## 2010-04-30 ENCOUNTER — Ambulatory Visit: Payer: Self-pay | Admitting: *Deleted

## 2010-08-13 NOTE — Discharge Summary (Signed)
NAME:  Ronald [name], Ronald [name] NO.:  [id number]   MEDICAL RECORD NO.:  [id number]          PATIENT TYPE:  INP   LOCATION:  5727                         FACILITY:  [place]   PHYSICIAN:  Shellia Carwin, M.D. DATE OF BIRTH:  02/01/1928   DATE OF ADMISSION:  12/23/2005  DATE OF DISCHARGE:  12/25/2005                               DISCHARGE SUMMARY   CARDIOLOGIST:  Ludwig Lean. Doreatha Lew, MD   PRIMARY CARE PHYSICIAN:  Marletta Lor, MD   CHIEF COMPLAINT/REASON FOR ADMISSION:  [name] is a 69-year male  patient, history of CAD and CABG, who began developing symptoms on  Sunday prior to admission.  He  thought it was reflux.  Symptoms seemed  to improve with Pepto-Bismol by the following Wednesday.  He developed  constant pain after evening meal, attempted Tylenol and nitroglycerin  and Pepto-Bismol without any change in the pain.  Since that time he has  had anorexia.  No fevers or chills.  No nausea.  He went to his family  physician the day before coming to the ER, and there was some concern  that the patient may be having ischemic symptoms so he was sent to see  Dr. Martinique, who felt that the patient's right upper quadrant pain on  exam was more consistent with biliary colic, so he set the patient up  for an outpatient ultrasound of the gallbladder since his pain had  nearly resolved.  Unfortunately, the night prior to coming to the ER the  patient had severe pain and dry heaves so he presented to the ER in the  middle of the night.  Cardiac workup there has been negative.  White  count elevated at 12,700, total bilirubin 1.7, and an ultrasound was  done that demonstrated stones and sludge and mild thickening of the  gallbladder wall.   On our exam, the patient's abdomen was distended with diminished bowel  sounds.  He had right upper quadrant tenderness with involuntary  guarding.  There was also a finding of a small supraumbilical wall  defect without  herniation.   ADMITTING DIAGNOSIS:  Acute cholecystitis with cholelithiasis.   HOSPITAL COURSE:  The patient was admitted from the ER prior to going to  the operating room for a surgical procedure.  Dr. Martinique with cardiology  did come by to evaluate the patient and approved him for surgery from a  cardiac standpoint.   On the day of admission the patient had did undergo a laparoscopic  cholecystectomy in the OR by Dr. Excell Seltzer for a diagnosis of acute  cholecystitis.  The patient tolerated the procedure well and was sent to  the general floor to recover.  By postop day #1 the patient was  tolerating clears.  He had not been out of bed.  By postop day #2 he was  voiding easily, no nausea or vomiting, tolerating a diet, walking  without difficulty.  He had a JP drain placed in the intraoperative  period.  This had been bloody drainage without bile, about 90 mL per.  This was removed by Dr. Rebekah Chesterfield prior  to discharge.   FINAL DISCHARGE DIAGNOSES:  1. Abdominal pain secondary to acute cholecystitis with      cholelithiasis.  2. Status post laparoscopic cholecystectomy.  3. Coronary artery disease, stable.   DISCHARGE MEDICATIONS:  1. Continue any medications you were taking before admission.  2. Tylenol or ibuprofen for pain.   Wound care, activity and follow-up appointments:  Please refer to the  laparoscopic cholecystectomy discharge sheet routine protocol from [place]:  You need to call Dr. Excell Seltzer to be seen in 2 weeks.      [name], N.P.    ______________________________  Shellia Carwin, M.D.    ALE/MEDQ  D:  03/06/2006  T:  03/06/2006  Job:  [id number]   cc:   Marletta Lor, MD  Ludwig Lean. Doreatha Lew, M.D.

## 2010-08-13 NOTE — Cardiovascular Report (Signed)
NAME:  Ronald [name], Ronald [name] NO.:  [id number]   MEDICAL RECORD NO.:  [id number]          PATIENT TYPE:  OIB   LOCATION:  1961                         FACILITY:  [facility]   PHYSICIAN:  Ludwig Lean. Doreatha Lew, M.D.DATE OF BIRTH:  1928/01/31   DATE OF PROCEDURE:  02/22/2005  DATE OF DISCHARGE:                              CARDIAC CATHETERIZATION   PROCEDURE:  Left heart catheterization with selective coronary angiography,  left ventricular angiography, saphenous vein graft angiography x3,  angiography of left internal mammary artery.   TYPE AND SITE OF ENTRY:  Percutaneous, right femoral artery.   CATHETERS:  The 4-French 4-curved Judkins right and coronary catheters, 4-  French pigtail ventriculographic catheter, 4-French left internal mammary  graft catheter, 4-French right coronary artery bypass, 4-French No-Torque, 4-  French left coronary catheter.   CONTRAST MATERIAL:  Omnipaque.   MEDICATIONS GIVEN PRIOR TO PROCEDURE:  Valium 10 mg p.o.   MEDICATIONS GIVEN DURING PROCEDURE:  Versed 2 mg IV.   COMMENT:  The patient tolerated the procedure well.   HEMODYNAMIC DATA:  The aortic pressure was 151/9-17.  The aortic pressure  was 137/66.  There was no aortic valve gradient noted on pullback.   ANGIOGRAPHIC DATA:  The left ventricular angiogram was performed in RAO  position.  Overall cardiac size was normal.  The global ejection fraction  was 40%.  There is inferobasilar akinesia.  There is no mitral  regurgitation.  The anterior apical walls moved well.   CORONARY ARTERIES:  1.  Left main coronary artery had a 90% distal narrowing.  There was diffuse      involvement of the bifurcation of the left main.  2.  Left anterior descending:  The left anterior descending was totally      occluded after a bifurcating diagonal system.  There was diffuse      segmental 70% to 90% narrowing proximal to this bifurcating branch.  3.  Left circumflex is totally occluded.  4.  Right  coronary artery is totally occluded proximally.  5.  Saphenous vein graft to obtuse marginal is widely patent.  It provides      excellent flow and collaterals to the distal right coronary artery.  6.  Saphenous vein graft to the intermediate diagonal is totally occluded.  7.  Saphenous vein graft to right coronary artery posterior descending      branch is totally occluded.  8.  The left internal mammary graft to the LAD and diagonal vessel was      patent.  It was very difficult to cannulate because the angle.      Satisfactory flush injections in the subclavian artery demonstrate      patency of the left anterior descending and diagonal system with what      appeared to be excellent flow and no significant obstructive disease was      present.   OVERALL IMPRESSION:  1.  Mild-to-moderate left ventricular dysfunction with inferobasilar      akinesia.  2.  Totally occluded native coronary circulation with diffuse disease in the      proximal left  anterior descending diagonal system which potentially      could be ischemic.  3.  Totally occluded saphenous vein graft to the intermediate diagonal and      saphenous vein graft to posterior descending and right coronary artery      branches.  4.  Patent saphenous vein graft to the obtuse marginal with collaterals to      the distal right coronary artery and patent left internal mammary graft      to the left anterior descending and diagonal systems.   DISCUSSION:  It would appear that Mr. Kocourek can be managed medically at  this point in time.  I think he will need to be treated for some degree of  LV dysfunction.  We will need to consider the addition of beta blockers as  well as possible ACE inhibitors as tolerated.  Overall, I think his coronary  anatomy is reasonably stable, but there are areas of potential ischemia  related to inadequate collateralization.      Ludwig Lean. Doreatha Lew, M.D.  Electronically Signed     SNT/MEDQ  D:   02/22/2005  T:  02/22/2005  Job:  [id number]

## 2010-08-13 NOTE — H&P (Signed)
NAME:  Ronald [name], Ronald [name]                 ACCOUNT NO.:  [id number]   MEDICAL RECORD NO.:  [id number]            PATIENT TYPE:   LOCATION:                                 FACILITY:   PHYSICIAN:  Ludwig Lean. Doreatha Lew, M.D.DATE OF BIRTH:  04-25-1927   DATE OF ADMISSION:  02/22/2005  DATE OF DISCHARGE:                                HISTORY & PHYSICAL   CHIEF COMPLAINT:  None.   HISTORY OF PRESENT ILLNESS:  The patient is a pleasant 77 year old white  male who has a known history of ischemic heart disease.  He underwent redo  coronary artery bypass grafting by Denton Meek. Redmond Pulling, M.D. in October of  1998.  He has been followed along clinically since that time.  He presented  for his annual follow-up visit and stress test on November 15.  Clinically,  he had been doing well without complaints of chest pain.   He exercised on the standard Bruce protocol.  He exercised for a total of 8  minutes 30 seconds to a maximum of 3.4 miles per hour at 14% grade.  Heart  rate rose to 142, blood pressure rose to 160/70.  The test was stopped due  to fatigue.  Clinically, there were no complaints of angina.   Resting electrocardiogram was normal.  With exercise, however, he had  definite ST segment depression laterally.  Late recovery.  There was also T  wave inversion in the anterior precordial leads.  The test was considered to  be electrocardiographically positive.  He is now referred for repeat  catheterization.  Clinically he has remained without symptoms.   PAST MEDICAL HISTORY:  1.  Known atherosclerotic cardiovascular disease.  He had initial coronary      artery bypass grafting x6 in 1989 and subsequent redo coronary artery      bypass grafting x5 in October of 1998 (saphenous vein graft to the      obtuse marginal, saphenous vein graft to the intermediate - first      diagonal, saphenous vein graft to the right coronary - PDA) at that time      he also had long patch graft angiography to the distal  right coronary as      well as partial endarterectomy to the distal right coronary.  2.  Known history of prostate cancer with subsequent seed implantation.  3.  History of kidney stones.  4.  Hyperlipidemia.  5.  Remote history of tonsillectomy and adenoidectomy.   ALLERGIES:  CIPRO.   CURRENT MEDICATIONS:  1.  Aspirin daily.  2.  Lipitor 40 mg a day.  3.  Multivitamin daily.  4.  Ginger daily.   FAMILY HISTORY:  Father died at 24 with tuberculosis and had diabetes.  Mother died at the age of 78 and had a history of heart disease.   SOCIAL HISTORY:  He is retired.  He quit all tobacco products over 30 years  ago.  He has no alcohol use.  He lives at home with his wife.   REVIEW OF SYSTEMS:  Basically as noted above.  He  denies a history of chest  pain or shortness of breath.  He has had problems with diverticulosis and  has been on intermittent bouts of antibiotics for diverticulitis.  He has  had no recent fever, flu, or cough.  Currently GI status is stable.  He has  had no chest pain.  He is not short of breath and has had no complaints of  palpitations.   PHYSICAL EXAMINATION:  GENERAL:  He is a pleasant, elderly white male in no  acute distress.  VITAL SIGNS:  Blood pressure 140/80 sitting, heart rate is in the 50's,  respirations 18, he is afebrile.  Weight is 168 pounds.  SKIN:  Warm and dry.  Color is unremarkable.  HEENT:  Unremarkable.  LUNGS:  Basically clear.  HEART:  Regular rhythm.  ABDOMEN:  Soft, positive bowel sounds, and nontender.  EXTREMITIES:  Without edema.  NEUROLOGY:  Intact.  There are no gross focal deficits.   LABORATORY DATA:  Pertinent labs are pending.   IMPRESSION:  1.  Abnormal stress test.  2.  Known history of ischemic heart disease with history of coronary artery      bypass grafting and subsequent redo coronary artery bypass grafting.  3.  Hyperlipidemia.  4.  Diverticulosis.  5.  History of prostate cancer.   PLAN:  We will  proceed on with repeat catheterization.  The procedure has  been reviewed in full detail including the risks and benefits and he is  willing to proceed on February 22, 2005.      Doyle Askew, N.P.      Ludwig Lean. Doreatha Lew, M.D.  Electronically Signed    LC/MEDQ  D:  02/13/2005  T:  02/14/2005  Job:  [id number]

## 2010-08-13 NOTE — Assessment & Plan Note (Signed)
[facility] OFFICE NOTE   Ronald [name], Ronald [name]                       MRN:          [id number]  DATE:12/22/2005                            DOB:          October 25, 1927    The patient is a 77 year old, white gentleman with a history of extensive  coronary artery disease.  He is status post CABG.  His last heart  catheterization was in November of last year that revealed two saphenous  vein grafts obstructed as well as obstructions in all his native  circulation.  He was stable until 5 days ago when he had several hours of  burning chest pain.  This was refractory to antacids as well as  nitroglycerin.  Yesterday evening, he had the onset of recurrent, right-  sided, burning chest pain without associated symptoms.  He did state the  pain seemed to be worse by the supine position and improved by sitting  upright.  This was again refractory to multiple antacids as well as  nitroglycerin.  He is on chronic PPI therapy.   PHYSICAL EXAMINATION:  GENERAL:  The patient is in no acute distress,  although complaining of some mild, right-sided chest pain.  VITAL SIGNS:  Blood pressure was normal.  Vital signs were stable.  O2  saturations 96-97%.  CHEST:  Clear.  CARDIAC:  Regular rhythm without murmurs.  ABDOMEN:  There is no chest wall or GI tenderness.   EKG was reviewed and this revealed evidence of a prior inferior wall MI, no  acute changes noted.   IMPRESSION:  1. Chest pain syndrome.  2. History of coronary artery disease, atypical features.   DISPOSITION:  Situation was discussed with Dr. Martinique who has agreed to see  the patient this afternoon.  A stat set of cardiac enzymes will be obtained.  If he has any worsening pain, he will report directly to the emergency room.            ______________________________  Marletta Lor, MD     PFK/MedQ  DD:  12/22/2005  DT:  12/24/2005  Job #:  [id number]

## 2010-08-13 NOTE — Op Note (Signed)
NAME:  [name], [name]                ACCOUNT NO.:  [id number]   MEDICAL RECORD NO.:  [id number]          PATIENT TYPE:  INP   LOCATION:  2550                         FACILITY:  [facility]   PHYSICIAN:  Marland Kitchen T. Hoxworth, M.D.DATE OF BIRTH:  [birth year]   DATE OF PROCEDURE:  12/23/2005  DATE OF DISCHARGE:                                 OPERATIVE REPORT   PREOPERATIVE DIAGNOSIS:  Acute cholecystitis.   POSTOPERATIVE DIAGNOSIS:  Acute cholecystitis.   SURGICAL PROCEDURES:  Laparoscopic cholecystectomy.   SURGEON:  Darene Lamer. Hoxworth, M.D.   ASSISTANT:  Sharyn Dross, RNFA   ANESTHESIA:  General.   BRIEF HISTORY:  Ronald [name] is a 77 year old male who presents with acute  right-sided abdominal and chest pain.  He has a cardiac history, and his  cardiologist, Dr. Martinique, ruled out cardiac disease, and the patient  presented to the emergency room, where a gallbladder ultrasound was  obtained.  This shows sludge in the gallbladder and thickening.  He has a  mildly elevated white blood count and significant right upper quadrant  tenderness.  He is felt to have acute cholecystitis, and laparoscopic  cholecystectomy been recommended and accepted.  The nature of the procedure,  indications, risks of bleeding, infection, bile leak, and bile duct injury  were discussed and understood by the patient and his family.  He is now  brought to the operating room for this procedure.   DESCRIPTION OF OPERATION:  The patient was brought to the operating room and  placed in the supine position on the operating table, and general  endotracheal anesthesia was induced.  He was already on broad-spectrum  antibiotics.  The abdomen was widely sterilely prepped and draped.  PAFs  were in place.  The correct patient and procedure were verified.  Local  anesthesia was used to infiltrate the trocar sites prior to the incisions.  A 1 cm incision was made at the umbilicus, and dissection carried down to  midline fascia, which was sharply size for 1 cm, and the peritoneum entered  under direct vision.  Through a mattress sutures of 0 Vicryl, the Hasson  trocar was placed and pneumoperitoneum established.  Under direct vision, a  10 mm trocar was placed in subxiphoid area and two 5 mm trocars along the  right subcostal margin.  Gallbladder was visualized and was tense, markedly  thickened, and acutely inflamed, with patchy gangrenous changes.  The needle  aspirator was used to decompress the gallbladder.  The fundus was then able  to be grasped, elevated up over the liver.  Some omental adhesions were  taken down with blunt and cautery dissection.  The infundibulum was  retracted inferolaterally.  Fibrofatty tissue was stripped off the neck of  the gallbladder toward the porta hepatis.  The distal gallbladder was  thoroughly dissected.  The cystic artery was clearly identified in Calot's  triangle, coursing up onto the gallbladder wall, and it was divided between  two proximal and one distal clip.  Calot's triangle was thoroughly  dissected, and the cystic duct-gallbladder junction dissected 360 degrees.  When the anatomy was clear,  I elected to forgo the cholangiogram, as the  cystic duct was fairly short, and there was significant inflammation.  The  patient's LFTs were essentially normal.  The cystic duct was then doubly  clipped proximally, clipped distally, and divided.  Following this, the  gallbladder was dissected free from its bed using hook cautery, placed in an  EndoCatch bag, and brought out through the umbilicus.  The right upper  quadrant was irrigated, and complete hemostasis was ensured.  A closed  suction drain was left under the liver and brought out through one of the  lateral trocar sites.  The trocars was removed, all CO2 evacuated, and  the mattress suture __________ at the umbilicus.  Skin incisions were closed  with a subcuticular 4-0 Monocryl and Steri-Strips.  Sponge,  needle, and  instrument counts were correct.  Dry sterile dressings were applied, and the  patient taken to recovery in good condition.      Darene Lamer. Hoxworth, M.D.  Electronically Signed     BTH/MEDQ  D:  12/23/2005  T:  12/26/2005  Job:  [id number]

## 2010-09-27 ENCOUNTER — Other Ambulatory Visit: Payer: Self-pay | Admitting: Dermatology

## 2010-10-22 ENCOUNTER — Encounter: Payer: Self-pay | Admitting: *Deleted

## 2010-10-26 ENCOUNTER — Ambulatory Visit (INDEPENDENT_AMBULATORY_CARE_PROVIDER_SITE_OTHER): Payer: Medicare Other | Admitting: Nurse Practitioner

## 2010-10-26 ENCOUNTER — Encounter: Payer: Self-pay | Admitting: Nurse Practitioner

## 2010-10-26 DIAGNOSIS — E7800 Pure hypercholesterolemia, unspecified: Secondary | ICD-10-CM

## 2010-10-26 DIAGNOSIS — I2510 Atherosclerotic heart disease of native coronary artery without angina pectoris: Secondary | ICD-10-CM

## 2010-10-26 DIAGNOSIS — I498 Other specified cardiac arrhythmias: Secondary | ICD-10-CM

## 2010-10-26 DIAGNOSIS — E785 Hyperlipidemia, unspecified: Secondary | ICD-10-CM

## 2010-10-26 DIAGNOSIS — R001 Bradycardia, unspecified: Secondary | ICD-10-CM

## 2010-10-26 LAB — BASIC METABOLIC PANEL
BUN: 20 mg/dL (ref 6–23)
CO2: 29 mEq/L (ref 19–32)
Calcium: 9.4 mg/dL (ref 8.4–10.5)
Chloride: 107 mEq/L (ref 96–112)
Creatinine, Ser: 1.1 mg/dL (ref 0.4–1.5)
GFR: 66.85 mL/min (ref >60.00)
Glucose, Bld: 102 mg/dL — ABNORMAL HIGH (ref 70–99)
Potassium: 5.2 mEq/L — ABNORMAL HIGH (ref 3.5–5.1)
Sodium: 142 mEq/L (ref 135–145)

## 2010-10-26 LAB — HEPATIC FUNCTION PANEL
ALT: 17 U/L (ref 0–53)
AST: 26 U/L (ref 0–37)
Albumin: 4.5 g/dL (ref 3.5–5.2)
Alkaline Phosphatase: 54 U/L (ref 39–117)
Bilirubin, Direct: 0.2 mg/dL (ref 0.0–0.3)
Total Bilirubin: 1 mg/dL (ref 0.3–1.2)
Total Protein: 7 g/dL (ref 6.0–8.3)

## 2010-10-26 LAB — LIPID PANEL
Cholesterol: 145 mg/dL (ref 0–200)
HDL: 57.8 mg/dL (ref >39.00)
LDL Cholesterol: 78 mg/dL (ref 0–99)
Total CHOL/HDL Ratio: 3
Triglycerides: 48 mg/dL (ref 0.0–149.0)
VLDL: 9.6 mg/dL (ref 0.0–40.0)

## 2010-10-26 NOTE — Patient Instructions (Signed)
Stay on your current medicines I think you are doing good. Stay active We will check your labs today and an EKG You will see Dr. Marius Ditch in 6 months.

## 2010-10-26 NOTE — Assessment & Plan Note (Signed)
Rate is slow. We will place a holter to look at his rate over a 24 hour period. He remains asymptomatic. He is not on any rate slowing medicines.

## 2010-10-26 NOTE — Progress Notes (Signed)
Ronald [name] Date of Birth: November 07, 1927   History of Present Illness: Ronald [name] is seen today for his 6 month visit. He is seen for Dr. Julianne Handler. He is a former patient of Dr. Susa Simmonds. He is doing well. The summer has been too hot for him. He has not been as active. He has lost some weight just by eating more vegetables and not eating bread. No chest pain. No shortness of breath. He is not dizzy or lightheaded. He is tolerating his medicines.   Current Outpatient Prescriptions on File Prior to Visit  Medication Sig Dispense Refill  . aspirin 325 MG tablet Take 325 mg by mouth daily.        . Calcium Carbonate-Vitamin D (CALCIUM + D PO) Take by mouth 2 (two) times daily.        . isosorbide mononitrate (IMDUR) 30 MG 24 hr tablet Take 30 mg by mouth daily.        Marland Kitchen lisinopril (PRINIVIL,ZESTRIL) 20 MG tablet Take 20 mg by mouth daily.        . Multiple Vitamin (MULTIVITAMIN) tablet Take 1 tablet by mouth daily.        . nitroGLYCERIN (NITROSTAT) 0.4 MG SL tablet Place 0.4 mg under the tongue every 5 (five) minutes as needed.        . rosuvastatin (CRESTOR) 10 MG tablet Take 10 mg by mouth daily.          Allergies  Allergen Reactions  . Ciprofloxacin     REACTION: Swelling Pain  . Sulfonamide Derivatives     REACTION: Hives    Past Medical History  Diagnosis Date  . Coronary artery disease     11/89 CABG  (70% circ; 90% PD; 60-70% distal left main; 30% left circ; 90% 1st diag;, 2nd diag and 3rd diag. w/90% mild LAD stenosis and 60-70% prox. stenosisin the LAD)  . Kidney stone     1970  . S/P T&A (status post tonsillectomy and adenoidectomy)     1954  . Cancer     prostate cancer and seed implant 1998  . Hypertension   . Hyperlipidemia   . Dementia     mild  . Normal nuclear stress test July 2011    Past Surgical History  Procedure Date  . Cardiac catheterization     11/06 -  occluded prox. LAD; totallyoccluded saph vein graft to inermediate diag and saph vein  graft to post desc. and RCA branches;  11/89 CABG  (70% circ; 90% PD; 60-70% distal left main; 30% left circ; 90% 1st diag;, 2nd diag and 3rd diag. w/90% mild LAD stenosis and 60-70% prox. stenosisin the LAD;     . Coronary artery bypass graft     11/89 CABG  (70% circ; 90% PD; 60-70% distal left main; 30% left circ; 90% 1st diag;, 2nd diag and 3rd diag. w/90% mild LAD stenosis and 60-70% prox. stenosisin the LAD    History  Smoking status  . Former Smoker  Smokeless tobacco  . Not on file    History  Alcohol Use No    Family History  Problem Relation Age of Onset  . Heart disease Mother     Review of Systems: The review of systems is positive for some forgetfulness. No chest pain. He is not dizzy. No falls.  All other systems were reviewed and are negative.  Physical Exam: BP 124/70  Pulse 54  Ht 5\' 5"  (1.651 m)  Wt 161 lb 9.6  oz (73.301 kg)  BMI 26.89 kg/m2 Patient is very pleasant and in no acute distress. Skin is warm and dry. Color is normal.  HEENT is unremarkable. Normocephalic/atraumatic. PERRL. Sclera are nonicteric. Neck is supple. No masses. No JVD. Lungs are clear. Cardiac exam shows a regular rhythm but the rate is slow. He has a few ectopics.. Abdomen is soft. Extremities are without edema. Gait and ROM are intact. No gross neurologic deficits noted.  LABORATORY DATA: EKG shows sinus brady with PAC's. Rate is 41   Assessment / Plan:

## 2010-10-26 NOTE — Assessment & Plan Note (Signed)
Labs are checked today.

## 2010-10-26 NOTE — Assessment & Plan Note (Signed)
Has history of CABG in 1989 with redo surgery in 1998. Last cath in November of 2006 with last stress test in July of 2011. He continues to be managed medically. He remains asymptomatic. Will see him in 6 months.

## 2010-10-28 ENCOUNTER — Telehealth: Payer: Self-pay | Admitting: *Deleted

## 2010-10-28 NOTE — Telephone Encounter (Signed)
Spoke w/ wife. Notified of lab results. Will send copy to Dr. Lianne Cure

## 2010-10-28 NOTE — Telephone Encounter (Signed)
Message copied by Hetty Blend on Thu Oct 28, 2010  9:50 AM ------      Message from: Burtis Junes      Created: Wed Oct 27, 2010  7:38 AM       Ok to report. Labs are satisfactory. Potassium is a little high. He has been eating lots of fruits. Recheck in 6 months.

## 2010-11-23 ENCOUNTER — Telehealth: Payer: Self-pay | Admitting: Cardiology

## 2010-11-23 NOTE — Telephone Encounter (Signed)
Former patient of Dr. Doreatha Lew that will f/u with Dr. Angelena Form. 24 Hour Holter was ordered for bradycardia showing NSR with frequent PAC's with one short run of SVT (3 beats).  He also has occasional PVC's. No Atrial fibrillation. LMTCB to discuss these results.

## 2010-11-24 NOTE — Telephone Encounter (Signed)
Patient is aware of monitor results. He is due to f/u with Dr. Angelena Form in December. He will call us back if he experiences any problems.

## 2011-06-16 ENCOUNTER — Ambulatory Visit (INDEPENDENT_AMBULATORY_CARE_PROVIDER_SITE_OTHER): Payer: Medicare Other | Admitting: Family Medicine

## 2011-06-16 ENCOUNTER — Ambulatory Visit: Payer: Medicare Other

## 2011-06-16 DIAGNOSIS — S2239XA Fracture of one rib, unspecified side, initial encounter for closed fracture: Secondary | ICD-10-CM

## 2011-06-16 DIAGNOSIS — R079 Chest pain, unspecified: Secondary | ICD-10-CM | POA: Diagnosis not present

## 2011-06-16 MED ORDER — HYDROCODONE-ACETAMINOPHEN 5-500 MG PO TABS: 1.0000 | ORAL_TABLET | Freq: Three times a day (TID) | ORAL | Status: AC | PRN

## 2011-06-16 NOTE — Progress Notes (Signed)
  Subjective:    Patient ID: Ronald [name], male    DOB: 02-Sep-1927, 78 y.o.   MRN: [id number]  HPI 78 yo male who fell yesterday and now having pain. Patient was carrying groceries, went up steps, nad lost balance.  [place] off to the side and landed on left side, left arm and left knee.  Having sharp pain in left side of chest and shoulder.  Only with deep breath, cough, laugh, hiccup, belching.   No shortness of breath.   Review of Systems Negative except as per HPI     Objective:   Physical Exam  Constitutional: He appears well-developed and well-nourished.  Cardiovascular: Normal rate, regular rhythm, normal heart sounds and intact distal pulses.   No murmur heard. Pulmonary/Chest: Effort normal and breath sounds normal.  Neurological: He is alert.  Skin: Skin is warm and dry.    TTP over left, lower, lateral ribs.   [facility] Primary radiology reading by Dr. Nadara Eaton: Suspicious area at lateral curvature of rib 7 (I believe) on 3rd film.  Elevated left hemidiaphragm and atelectasis noted.     Assessment & Plan:  Rib pain following fall - Hairline fracture per radiology. Will not greatly change management.  Pain management, encourage deep breathing multiple times daily.  REcheck 1 week or sooner if worse.  Will use vicodin as due to extensive CAD and Cr 1.1, has been advised to avoid ibuprofen.

## 2011-06-18 ENCOUNTER — Ambulatory Visit (INDEPENDENT_AMBULATORY_CARE_PROVIDER_SITE_OTHER): Payer: Medicare Other | Admitting: Family Medicine

## 2011-06-18 VITALS — BP 106/57 | HR 62 | Temp 97.9°F | Resp 16 | Ht 65.5 in | Wt 169.0 lb

## 2011-06-18 DIAGNOSIS — S2232XA Fracture of one rib, left side, initial encounter for closed fracture: Secondary | ICD-10-CM

## 2011-06-18 DIAGNOSIS — S2239XA Fracture of one rib, unspecified side, initial encounter for closed fracture: Secondary | ICD-10-CM

## 2011-06-18 MED ORDER — OXYCODONE-ACETAMINOPHEN 5-325 MG PO TABS: 1.0000 | ORAL_TABLET | Freq: Three times a day (TID) | ORAL | Status: AC | PRN

## 2011-06-18 NOTE — Patient Instructions (Signed)
Rib Fracture Your caregiver has diagnosed you as having a rib fracture (a break). This can occur by a blow to the chest, by a fall against a hard object, or by violent coughing or sneezing. There may be one or many breaks. Rib fractures may heal on their own within 3 to 8 weeks. The longer healing period is usually associated with a continued cough or other aggravating activities. HOME CARE INSTRUCTIONS   Avoid strenuous activity. Be careful during activities and avoid bumping the injured rib. Activities that cause pain pull on the fracture site(s) and are best avoided if possible.   Eat a normal, well-balanced diet. Drink plenty of fluids to avoid constipation.   Take deep breaths several times a day to keep lungs free of infection. Try to cough several times a day, splinting the injured area with a pillow. This will help prevent pneumonia.   Do not wear a rib belt or binder. These restrict breathing which can lead to pneumonia.   Only take over-the-counter or prescription medicines for pain, discomfort, or fever as directed by your caregiver.  SEEK MEDICAL CARE IF:  You develop a continual cough, associated with thick or bloody sputum. SEEK IMMEDIATE MEDICAL CARE IF:   You have a fever.   You have difficulty breathing.   You have nausea (feeling sick to your stomach), vomiting, or abdominal (belly) pain.   You have worsening pain, not controlled with medications.  Document Released: 03/14/2005 Document Revised: 03/03/2011 Document Reviewed: 08/16/2006 [employer] Patient Information 2012 [employer]. 

## 2011-06-18 NOTE — Progress Notes (Signed)
78 yo man who fell 2 days ago when he had an armload of groceries and he lost his balance.  Landed on left arm which pushed into rib cage.  He has been very sore since.  Was seen initially and given rx for vicodin.  O:  NAD, friendly, patient seen wife HEENT: unremarkable Shoulder ROM normal Heart: reg, no murmur Chest:  Normal inspection, tender mid left lateral ribs, clear to auscultation Skin:  No ecchymosis Chest x-rays show hairline fracture of left 7th  Rib  A:  Rib fracture with poor pain control  P:  Change to percocet 5/325. Recheck in 3 days

## 2011-06-21 ENCOUNTER — Telehealth: Payer: Self-pay | Admitting: Cardiovascular Disease

## 2011-06-21 DIAGNOSIS — I2510 Atherosclerotic heart disease of native coronary artery without angina pectoris: Secondary | ICD-10-CM

## 2011-06-21 DIAGNOSIS — E785 Hyperlipidemia, unspecified: Secondary | ICD-10-CM

## 2011-06-21 NOTE — Telephone Encounter (Signed)
Spoke with pt's wife. Pt will come in fasting for appt with Dr. Angelena Form in April

## 2011-06-21 NOTE — Telephone Encounter (Signed)
Pt set up re-est visit with mcalhany , pt wants to know if he needs labs?

## 2011-07-07 ENCOUNTER — Other Ambulatory Visit (INDEPENDENT_AMBULATORY_CARE_PROVIDER_SITE_OTHER): Payer: Medicare Other

## 2011-07-07 DIAGNOSIS — E785 Hyperlipidemia, unspecified: Secondary | ICD-10-CM | POA: Diagnosis not present

## 2011-07-07 DIAGNOSIS — I2510 Atherosclerotic heart disease of native coronary artery without angina pectoris: Secondary | ICD-10-CM

## 2011-07-07 LAB — BASIC METABOLIC PANEL
BUN: 23 mg/dL (ref 6–23)
CO2: 28 mEq/L (ref 19–32)
Calcium: 9.2 mg/dL (ref 8.4–10.5)
Chloride: 105 mEq/L (ref 96–112)
Creatinine, Ser: 0.9 mg/dL (ref 0.4–1.5)
GFR: 85.89 mL/min (ref >60.00)
Glucose, Bld: 105 mg/dL — ABNORMAL HIGH (ref 70–99)
Potassium: 4.3 mEq/L (ref 3.5–5.1)
Sodium: 141 mEq/L (ref 135–145)

## 2011-07-07 LAB — LIPID PANEL
Cholesterol: 152 mg/dL (ref 0–200)
HDL: 55.4 mg/dL (ref >39.00)
LDL Cholesterol: 87 mg/dL (ref 0–99)
Total CHOL/HDL Ratio: 3
Triglycerides: 48 mg/dL (ref 0.0–149.0)
VLDL: 9.6 mg/dL (ref 0.0–40.0)

## 2011-07-07 LAB — HEPATIC FUNCTION PANEL
ALT: 17 U/L (ref 0–53)
AST: 21 U/L (ref 0–37)
Albumin: 4.1 g/dL (ref 3.5–5.2)
Alkaline Phosphatase: 86 U/L (ref 39–117)
Bilirubin, Direct: 0.1 mg/dL (ref 0.0–0.3)
Total Bilirubin: 0.6 mg/dL (ref 0.3–1.2)
Total Protein: 6.9 g/dL (ref 6.0–8.3)

## 2011-07-07 NOTE — Telephone Encounter (Signed)
Addended by: Thompson Grayer on: 07/07/2011 09:24 AM   Modules accepted: Orders

## 2011-07-07 NOTE — Telephone Encounter (Signed)
Pt here for labs. He is fasting. Will order lipid and liver profiles and BMP

## 2011-07-19 ENCOUNTER — Ambulatory Visit (INDEPENDENT_AMBULATORY_CARE_PROVIDER_SITE_OTHER): Payer: Medicare Other | Admitting: Cardiovascular Disease

## 2011-07-19 ENCOUNTER — Encounter: Payer: Self-pay | Admitting: Cardiovascular Disease

## 2011-07-19 VITALS — BP 106/58 | HR 57 | Ht 65.0 in | Wt 164.0 lb

## 2011-07-19 DIAGNOSIS — I2510 Atherosclerotic heart disease of native coronary artery without angina pectoris: Secondary | ICD-10-CM | POA: Diagnosis not present

## 2011-07-19 NOTE — Progress Notes (Signed)
History of Present Illness: 78 yo male with history of CAD s/p CABG in 1989 and redo bypass in 1997 per Dr. Redmond Pulling, nephrolithiasis, HTN, HLD, mild dementia, prostate cancer here today for cardiac follow up. He has been followed in the past by Dr. Doreatha Lew. He was seen by Truitt Merle, NP in July 2012.   He has recently fallen and broken a rib. He is having some discomfort from this. He fell from his steps while carrying groceries. He did not feel dizzy. He has no exertional chest pain or pressure. He does describe SOB and fatigue.   Primary Care Physician: Burnice Logan  Last Lipid Profile:  Lipid Panel     Component Value Date/Time   CHOL 152 07/07/2011 0931   TRIG 48.0 07/07/2011 0931   HDL 55.40 07/07/2011 0931   CHOLHDL 3 07/07/2011 0931   VLDL 9.6 07/07/2011 0931   LDLCALC 87 07/07/2011 0931     Past Medical History  Diagnosis Date  . Coronary artery disease     11/89 CABG  (70% circ; 90% PD; 60-70% distal left main; 30% left circ; 90% 1st diag;, 2nd diag and 3rd diag. w/90% mild LAD stenosis and 60-70% prox. stenosisin the LAD)  . Kidney stone     1970  . S/P T&A (status post tonsillectomy and adenoidectomy)     1954  . Cancer     prostate cancer and seed implant 1998  . Hypertension   . Hyperlipidemia   . Dementia     mild  . Normal nuclear stress test July 2011    Past Surgical History  Procedure Date  . Coronary artery bypass graft     11/89 CABG  (70% circ; 90% PD; 60-70% distal left main; 30% left circ; 90% 1st diag;, 2nd diag and 3rd diag. w/90% mild LAD stenosis and 60-70% prox. stenosisin the LAD. Redo bypass in 1997    Current Outpatient Prescriptions  Medication Sig Dispense Refill  . acetaminophen (TYLENOL) 500 MG tablet Take 500 mg by mouth every 6 (six) hours as needed. Take 1 -2 pills      . aspirin 325 MG tablet Take 325 mg by mouth daily.        . Calcium Carbonate-Vitamin D (CALCIUM + D PO) Take by mouth 2 (two) times daily.        Marland Kitchen galantamine  (RAZADYNE) 8 MG tablet Take 8 mg by mouth 1 day or 1 dose.      . isosorbide mononitrate (IMDUR) 30 MG 24 hr tablet Take 30 mg by mouth daily.        Marland Kitchen lisinopril (PRINIVIL,ZESTRIL) 20 MG tablet Take 20 mg by mouth daily.        . Multiple Vitamin (MULTIVITAMIN) tablet Take 1 tablet by mouth daily.        . nitroGLYCERIN (NITROSTAT) 0.4 MG SL tablet Place 0.4 mg under the tongue every 5 (five) minutes as needed.        . rosuvastatin (CRESTOR) 10 MG tablet Take 10 mg by mouth daily.          Allergies  Allergen Reactions  . Ciprofloxacin     REACTION: Swelling Pain  . Sulfonamide Derivatives     REACTION: Hives    History   Social History  . Marital Status: Married    Spouse Name: N/A    Number of Children: N/A  . Years of Education: N/A   Occupational History  . Not on file.   Social History Main Topics  .  Smoking status: Former Research scientist (life sciences)  . Smokeless tobacco: Not on file  . Alcohol Use: No  . Drug Use: No  . Sexually Active: Not Currently   Other Topics Concern  . Not on file   Social History Narrative  . No narrative on file    Family History  Problem Relation Age of Onset  . Heart disease Mother     Review of Systems:  As stated in the HPI and otherwise negative.   BP 106/58  Pulse 57  Ht 5\' 5"  (1.651 m)  Wt 164 lb (74.39 kg)  BMI 27.29 kg/m2  Physical Examination: General: Well developed, well nourished, NAD HEENT: OP clear, mucus membranes moist SKIN: warm, dry. No rashes. Neuro: No focal deficits Musculoskeletal: Muscle strength 5/5 all ext Psychiatric: Mood and affect normal Neck: No JVD, no carotid bruits, no thyromegaly, no lymphadenopathy. Lungs:Clear bilaterally, no wheezes, rhonci, crackles Cardiovascular: Regular rate and rhythm. No murmurs, gallops or rubs. Abdomen:Soft. Bowel sounds present. Non-tender.  Extremities: No lower extremity edema. Pulses are 2 + in the bilateral DP/PT.  EKG: Sinus bradycardia, rate 57 bpm. 1st degree AV  block. LVH. Old inferior infarct.

## 2011-07-19 NOTE — Patient Instructions (Signed)
Your physician wants you to follow-up in: 6 months.   You will receive a reminder letter in the mail two months in advance. If you don't receive a letter, please call our office to schedule the follow-up appointment.  Your physician has requested that you have a lexiscan myoview. For further information please visit [email or web address]. Please follow instruction sheet, as given.   

## 2011-07-19 NOTE — Assessment & Plan Note (Addendum)
He is known to have CAD with two previous bypass surgeries. He has some SOB and fatigue. Will arrange Lexiscan stress  myoview to exclude ischemia. Continue current meds. Lipids and BP are at goal.

## 2011-08-01 ENCOUNTER — Ambulatory Visit (HOSPITAL_COMMUNITY): Payer: Medicare Other | Attending: Internal Medicine | Admitting: Radiology

## 2011-08-01 VITALS — BP 128/78 | Ht 65.0 in | Wt 164.0 lb

## 2011-08-01 DIAGNOSIS — R0609 Other forms of dyspnea: Secondary | ICD-10-CM | POA: Insufficient documentation

## 2011-08-01 DIAGNOSIS — I10 Essential (primary) hypertension: Secondary | ICD-10-CM | POA: Insufficient documentation

## 2011-08-01 DIAGNOSIS — R0989 Other specified symptoms and signs involving the circulatory and respiratory systems: Secondary | ICD-10-CM | POA: Insufficient documentation

## 2011-08-01 DIAGNOSIS — R002 Palpitations: Secondary | ICD-10-CM | POA: Diagnosis not present

## 2011-08-01 DIAGNOSIS — R079 Chest pain, unspecified: Secondary | ICD-10-CM | POA: Insufficient documentation

## 2011-08-01 DIAGNOSIS — Z951 Presence of aortocoronary bypass graft: Secondary | ICD-10-CM | POA: Insufficient documentation

## 2011-08-01 DIAGNOSIS — Z8249 Family history of ischemic heart disease and other diseases of the circulatory system: Secondary | ICD-10-CM | POA: Insufficient documentation

## 2011-08-01 DIAGNOSIS — I2510 Atherosclerotic heart disease of native coronary artery without angina pectoris: Secondary | ICD-10-CM | POA: Diagnosis not present

## 2011-08-01 DIAGNOSIS — E785 Hyperlipidemia, unspecified: Secondary | ICD-10-CM | POA: Diagnosis not present

## 2011-08-01 DIAGNOSIS — Z87891 Personal history of nicotine dependence: Secondary | ICD-10-CM | POA: Insufficient documentation

## 2011-08-01 DIAGNOSIS — R5383 Other fatigue: Secondary | ICD-10-CM | POA: Insufficient documentation

## 2011-08-01 DIAGNOSIS — R5381 Other malaise: Secondary | ICD-10-CM | POA: Diagnosis not present

## 2011-08-01 DIAGNOSIS — R0602 Shortness of breath: Secondary | ICD-10-CM | POA: Insufficient documentation

## 2011-08-01 MED ORDER — TECHNETIUM TC 99M TETROFOSMIN IV KIT
33.0000 | PACK | Freq: Once | INTRAVENOUS | Status: AC | PRN
  Administered 2011-08-01: 33 via INTRAVENOUS

## 2011-08-01 MED ORDER — REGADENOSON 0.4 MG/5ML IV SOLN
0.4000 mg | Freq: Once | INTRAVENOUS | Status: AC
  Administered 2011-08-01: 0.4 mg via INTRAVENOUS

## 2011-08-01 MED ORDER — TECHNETIUM TC 99M TETROFOSMIN IV KIT
11.0000 | PACK | Freq: Once | INTRAVENOUS | Status: AC | PRN
  Administered 2011-08-01: 11 via INTRAVENOUS

## 2011-08-01 NOTE — Progress Notes (Signed)
[place] [address] [phone]  Cardiology Nuclear Med Study  Ronald [name] is a 78 y.o. male     MRN : [id number]     DOB: 1927-12-17  Procedure Date: 08/01/2011  Nuclear Med Background Indication for Stress Test:  Evaluation for Ischemia and Graft Patency History: 1989, CABGx6 1998 redo x5, 2001 ECHO: EF: 60-65%, 2006: EF:40% 2 patent grafts, 2 grafts occluded Rx Tx, 09/2009 MPS: Anterior ischemia RX TX, inferior scar EF: 59% Cardiac Risk Factors: Family History - CAD, History of Smoking, Hypertension and Lipids  Symptoms:  Chest Pain, DOE, Fatigue, Palpitations and SOB   Nuclear Pre-Procedure Caffeine/Decaff Intake:  None NPO After: 7:00pm   Lungs:  clear O2 Sat: 96% on room air. IV 0.9% NS with Angio Cath:  20g  IV Site: R Hand  IV Started by:  Matilde Haymaker, RN  Chest Size (in):  42 Cup Size: n/a  Height: 5\' 5"  (1.651 m)  Weight:  164 lb (74.39 kg)  BMI:  Body mass index is 27.29 kg/(m^2). Tech Comments:  n/a    Nuclear Med Study 1 or 2 day study: 1 day  Stress Test Type:  Treadmill/Lexiscan  Reading MD: Dorris Carnes, MD  Order Authorizing Provider:  Gennette Pac  Resting Radionuclide: Technetium 53m Tetrofosmin  Resting Radionuclide Dose: 11.0 mCi   Stress Radionuclide:  Technetium 32m Tetrofosmin  Stress Radionuclide Dose: 33.0 mCi           Stress Protocol Rest HR: 49 Stress HR: 85  Rest BP: 128/78 Stress BP: 138/70  Exercise Time (min): n/a METS: n/a   Predicted Max HR: 139 bpm % Max HR: 61.15 bpm Rate Pressure Product: 11730   Dose of Adenosine (mg):  n/a Dose of Lexiscan: 0.4 mg  Dose of Atropine (mg): n/a Dose of Dobutamine: n/a mcg/kg/min (at max HR)  Stress Test Technologist: Perrin Maltese, EMT-P  Nuclear Technologist:  Charlton Amor, CNMT     Rest Procedure:  Myocardial perfusion imaging was performed at rest 45 minutes following the intravenous administration of  Technetium 58m Tetrofosmin. Rest ECG: Sinus Bradycardia with PACS  Stress Procedure:  The patient received IV Lexiscan 0.4 mg over 15-seconds.  Technetium 97m Tetrofosmin injected at 30-seconds.  There were no significant changes, + sob, dizzy, and occ pacs/rare pvc with Lexiscan.  Quantitative spect images were obtained after a 45 minute delay. Stress ECG: No significant change from baseline ECG  QPS Raw Data Images:  Images were motion corrected.  Soft tissue (diaphragm, bowel activity) underlies heart. Stress Images:  Defect in the anterior wall (minimally base, mid); inferior wall (base, mid), inferoseptal wall (base).  Otherwise normal perfusion. Rest Images:  Partial improvement from the stress images in the anterior (mid), inferior (mid). Subtraction (SDS):  Not significant for ischemia by quantitation. Transient Ischemic Dilatation (Normal <1.22): 1.03 Lung/Heart Ratio (Normal <0.45):  0.29  Quantitative Gated Spect Images QGS EDV:  91 ml QGS ESV:  43 ml  Impression Exercise Capacity:  Lexiscan with low level exercise. BP Response:  Normal blood pressure response. Clinical Symptoms:  No chest pain. ECG Impression:  No significant ST segment change suggestive of ischemia. Comparison with Prior Nuclear Study: By report no significant change from scan of July 2011.  Overall Impression:  Inferior scar and possible soft tissue attenuation. with minimal periinfarct ischemia.  Small region of anterior ischemia and scar.    LV Ejection Fraction: 53%.  LV Wall Motion:  Inferior hypokinesis.  Dorris Carnes

## 2011-08-09 ENCOUNTER — Encounter: Payer: Self-pay | Admitting: Cardiovascular Disease

## 2011-08-09 ENCOUNTER — Ambulatory Visit (INDEPENDENT_AMBULATORY_CARE_PROVIDER_SITE_OTHER): Payer: Medicare Other | Admitting: Cardiovascular Disease

## 2011-08-09 VITALS — BP 106/62 | HR 57 | Ht 65.0 in | Wt 168.0 lb

## 2011-08-09 DIAGNOSIS — I2510 Atherosclerotic heart disease of native coronary artery without angina pectoris: Secondary | ICD-10-CM | POA: Diagnosis not present

## 2011-08-09 NOTE — Progress Notes (Signed)
History of Present Illness: 78 yo male with history of CAD s/p CABG in 1989 and redo bypass in 1997 per Dr. Redmond Pulling, nephrolithiasis, HTN, HLD, mild dementia, prostate cancer here today for cardiac follow up. He has been followed in the past by Dr. Doreatha Lew. He was seen by Truitt Merle, NP in July 2012.  I saw him April 2013 and he had recently fallen and broken a rib. He was having some discomfort from this. He fell from his steps while carrying groceries. He did not feel dizzy. He has no exertional chest pain or pressure. He did describe SOB and fatigue. I arranged a Lexiscan myoview given his dyspnea and fatigue and h/o CAD. This showed inferior scar with soft tissue attenuation and small region of anterior scar with possible small area of ischemia.    Primary Care Physician:  Last Lipid Profile:  Past Medical History  Diagnosis Date  . Coronary artery disease     11/89 CABG  (70% circ; 90% PD; 60-70% distal left main; 30% left circ; 90% 1st diag;, 2nd diag and 3rd diag. w/90% mild LAD stenosis and 60-70% prox. stenosisin the LAD)  . Kidney stone     1970  . S/P T&A (status post tonsillectomy and adenoidectomy)     1954  . Cancer     prostate cancer and seed implant 1998  . Hyperlipidemia   . Dementia     mild  . Normal nuclear stress test July 2011    Past Surgical History  Procedure Date  . Coronary artery bypass graft     11/89 CABG  (70% circ; 90% PD; 60-70% distal left main; 30% left circ; 90% 1st diag;, 2nd diag and 3rd diag. w/90% mild LAD stenosis and 60-70% prox. stenosisin the LAD. Redo bypass in 1997    Current Outpatient Prescriptions  Medication Sig Dispense Refill  . acetaminophen (TYLENOL) 500 MG tablet Take 500 mg by mouth every 6 (six) hours as needed. Take 1 -2 pills      . aspirin 325 MG tablet Take 325 mg by mouth daily.        . Calcium Carbonate-Vitamin D (CALCIUM + D PO) Take by mouth 2 (two) times daily.        Marland Kitchen galantamine (RAZADYNE) 8 MG tablet Take  8 mg by mouth 1 day or 1 dose.      . isosorbide mononitrate (IMDUR) 30 MG 24 hr tablet Take 30 mg by mouth daily.        Marland Kitchen lisinopril (PRINIVIL,ZESTRIL) 20 MG tablet Take 20 mg by mouth daily.        . Multiple Vitamin (MULTIVITAMIN) tablet Take 1 tablet by mouth daily.        . nitroGLYCERIN (NITROSTAT) 0.4 MG SL tablet Place 0.4 mg under the tongue every 5 (five) minutes as needed.        . rosuvastatin (CRESTOR) 10 MG tablet Take 10 mg by mouth daily.          Allergies  Allergen Reactions  . Ciprofloxacin     REACTION: Swelling Pain  . Sulfonamide Derivatives     REACTION: Hives    History   Social History  . Marital Status: Married    Spouse Name: N/A    Number of Children: N/A  . Years of Education: N/A   Occupational History  . Not on file.   Social History Main Topics  . Smoking status: Former Research scientist (life sciences)  . Smokeless tobacco: Not on file  .  Alcohol Use: No  . Drug Use: No  . Sexually Active: Not Currently   Other Topics Concern  . Not on file   Social History Narrative  . No narrative on file    Family History  Problem Relation Age of Onset  . Heart disease Mother     Review of Systems:  As stated in the HPI and otherwise negative.   BP 106/62  Pulse 57  Ht 5\' 5"  (1.651 m)  Wt 168 lb (76.204 kg)  BMI 27.96 kg/m2  Physical Examination: General: Well developed, well nourished, NAD HEENT: OP clear, mucus membranes moist SKIN: warm, dry. No rashes. Neuro: No focal deficits Musculoskeletal: Muscle strength 5/5 all ext Psychiatric: Mood and affect normal Neck: No JVD, no carotid bruits, no thyromegaly, no lymphadenopathy. Lungs:Clear bilaterally, no wheezes, rhonci, crackles Cardiovascular: Regular rate and rhythm. No murmurs, gallops or rubs. Abdomen:Soft. Bowel sounds present. Non-tender.  Extremities: No lower extremity edema. Pulses are 2 + in the bilateral DP/PT.  Lexiscan stress myoview: 08/01/11:  Stress Procedure: The patient received IV  Lexiscan 0.4 mg over 15-seconds. Technetium 88m Tetrofosmin injected at 30-seconds. There were no significant changes, + sob, dizzy, and occ pacs/rare pvc with Lexiscan. Quantitative spect images were obtained after a 45 minute delay.  Stress ECG: No significant change from baseline ECG  QPS  Raw Data Images: Images were motion corrected. Soft tissue (diaphragm, bowel activity) underlies heart.  Stress Images: Defect in the anterior wall (minimally base, mid); inferior wall (base, mid), inferoseptal wall (base). Otherwise normal perfusion.  Rest Images: Partial improvement from the stress images in the anterior (mid), inferior (mid).  Subtraction (SDS): Not significant for ischemia by quantitation.  Transient Ischemic Dilatation (Normal <1.22): 1.03  Lung/Heart Ratio (Normal <0.45): 0.29  Quantitative Gated Spect Images  QGS EDV: 91 ml  QGS ESV: 43 ml  Impression  Exercise Capacity: Lexiscan with low level exercise.  BP Response: Normal blood pressure response.  Clinical Symptoms: No chest pain.  ECG Impression: No significant ST segment change suggestive of ischemia.  Comparison with Prior Nuclear Study: By report no significant change from scan of July 2011.  Overall Impression: Inferior scar and possible soft tissue attenuation. with minimal periinfarct ischemia. Small region of anterior ischemia and scar.  LV Ejection Fraction: 53%. LV Wall Motion: Inferior hypokinesis.

## 2011-08-09 NOTE — Patient Instructions (Signed)
Your physician wants you to follow-up in:  6 months. You will receive a reminder letter in the mail two months in advance. If you don't receive a letter, please call our office to schedule the follow-up appointment.   

## 2011-08-09 NOTE — Assessment & Plan Note (Addendum)
Stable. Low risk stress test this month. No changes in cardiac meds. Continue statin, ASA, Ace-inh, Imdur. No beta blocker secondary to bradycardia.

## 2011-09-06 ENCOUNTER — Ambulatory Visit (INDEPENDENT_AMBULATORY_CARE_PROVIDER_SITE_OTHER): Payer: Medicare Other | Admitting: Internal Medicine

## 2011-09-06 ENCOUNTER — Encounter: Payer: Self-pay | Admitting: Internal Medicine

## 2011-09-06 VITALS — BP 110/70 | Temp 97.6°F | Wt 166.0 lb

## 2011-09-06 DIAGNOSIS — Z87442 Personal history of urinary calculi: Secondary | ICD-10-CM | POA: Diagnosis not present

## 2011-09-06 DIAGNOSIS — R350 Frequency of micturition: Secondary | ICD-10-CM | POA: Diagnosis not present

## 2011-09-06 DIAGNOSIS — I2510 Atherosclerotic heart disease of native coronary artery without angina pectoris: Secondary | ICD-10-CM | POA: Diagnosis not present

## 2011-09-06 DIAGNOSIS — Z8546 Personal history of malignant neoplasm of prostate: Secondary | ICD-10-CM | POA: Diagnosis not present

## 2011-09-06 MED ORDER — AMOXICILLIN 500 MG PO CAPS: 500.0000 mg | ORAL_CAPSULE | Freq: Three times a day (TID) | ORAL | Status: AC

## 2011-09-06 NOTE — Progress Notes (Signed)
  Subjective:    Patient ID: Ronald [name], male    DOB: 1927/08/06, 78 y.o.   MRN: [id number]  HPI  78 year old patient who has a history of prostate cancer treated with the seed implantation in 1997. He also has a history of nephrolithiasis. For the past 3 days she has had urinary frequency urgency associated with occasional incontinence he describes some dysuria with voiding as well. He is followed closely by cardiology due to coronary artery disease and is also followed at the [facility] system he has had no recent visits here    Review of Systems  Genitourinary: Positive for dysuria, urgency, frequency and difficulty urinating. Negative for hematuria and discharge.       Objective:   Physical Exam  Constitutional: He appears well-developed and well-nourished. No distress.       Blood pressure well controlled          Assessment & Plan:   Probable UTI. Urinalysis shows hematuria and pyuria. Symptoms are fairly acute. He does have allergies to sulfa and Cipro but has done well with a penicillin in the past. We'll treat with amoxicillin

## 2011-09-06 NOTE — Patient Instructions (Signed)
Take your antibiotic as prescribed until ALL of it is gone, but stop if you develop a rash, swelling, or any side effects of the medication.  Contact our office as soon as possible if  there are side effects of the medication.  Call or return to clinic prn if these symptoms worsen or fail to improve as anticipated.  

## 2011-09-09 ENCOUNTER — Encounter: Payer: Self-pay | Admitting: Internal Medicine

## 2011-09-09 ENCOUNTER — Ambulatory Visit (INDEPENDENT_AMBULATORY_CARE_PROVIDER_SITE_OTHER): Payer: Medicare Other | Admitting: Internal Medicine

## 2011-09-09 VITALS — BP 110/74 | Temp 97.8°F | Wt 168.0 lb

## 2011-09-09 DIAGNOSIS — Z87442 Personal history of urinary calculi: Secondary | ICD-10-CM

## 2011-09-09 DIAGNOSIS — R319 Hematuria, unspecified: Secondary | ICD-10-CM | POA: Diagnosis not present

## 2011-09-09 DIAGNOSIS — Z8546 Personal history of malignant neoplasm of prostate: Secondary | ICD-10-CM | POA: Diagnosis not present

## 2011-09-09 DIAGNOSIS — R310 Gross hematuria: Secondary | ICD-10-CM | POA: Diagnosis not present

## 2011-09-09 LAB — POCT URINALYSIS DIPSTICK
Bilirubin, UA: NEGATIVE
Glucose, UA: NEGATIVE
Ketones, UA: NEGATIVE
Nitrite, UA: NEGATIVE
Spec Grav, UA: 1.025
Urobilinogen, UA: 0.2
pH, UA: 6

## 2011-09-09 MED ORDER — PHENAZOPYRIDINE HCL 200 MG PO TABS: 200.0000 mg | ORAL_TABLET | Freq: Three times a day (TID) | ORAL | Status: AC | PRN

## 2011-09-09 MED ORDER — HYDROCODONE-ACETAMINOPHEN 5-500 MG PO TABS: 1.0000 | ORAL_TABLET | ORAL | Status: AC | PRN

## 2011-09-09 NOTE — Progress Notes (Signed)
  Subjective:    Patient ID: Ronald [name], male    DOB: [birth year], 78 y.o.   MRN: [id number]  HPI 78 year old patient who has a history of nephrolithiasis and also remote history of prostate cancer status post seed implantation 1997. He was seen here a few days ago with burning dysuria and UA revealed pyuria and hematuria he has been on amoxicillin. For the past 2 days he has had worsening dysuria penile pain and the onset of gross hematuria. Denies any flank pain fever or chills.   Review of Systems  Constitutional: Negative for fever, chills, appetite change and fatigue.  HENT: Negative for hearing loss, ear pain, congestion, sore throat, trouble swallowing, neck stiffness, dental problem, voice change and tinnitus.   Eyes: Negative for pain, discharge and visual disturbance.  Respiratory: Negative for cough, chest tightness, wheezing and stridor.   Cardiovascular: Negative for chest pain, palpitations and leg swelling.  Gastrointestinal: Negative for nausea, vomiting, abdominal pain, diarrhea, constipation, blood in stool and abdominal distention.  Genitourinary: Positive for dysuria, urgency, frequency, hematuria, difficulty urinating and penile pain. Negative for flank pain, discharge and genital sores.  Musculoskeletal: Negative for myalgias, back pain, joint swelling, arthralgias and gait problem.  Skin: Negative for rash.  Neurological: Negative for dizziness, syncope, speech difficulty, weakness, numbness and headaches.  Hematological: Negative for adenopathy. Does not bruise/bleed easily.  Psychiatric/Behavioral: Negative for behavioral problems and dysphoric mood. The patient is not nervous/anxious.        Objective:   Physical Exam  Constitutional: He appears well-developed and well-nourished. No distress.       No distress. Afebrile. Blood pressure low normal.          Assessment & Plan:   Gross hematuria. Historically it sounds like he may be passing clots as well.  We'll continue antibiotic therapy add Pyridium and oral analgesics and schedule for prompt urological evaluation

## 2011-09-09 NOTE — Patient Instructions (Signed)
Drink as much fluid as you  can tolerate over the next few days  Urology followup as discussed  Call if you develop worsening pain fever or flank pain or unable to void

## 2011-09-12 ENCOUNTER — Ambulatory Visit
Admission: RE | Admit: 2011-09-12 | Discharge: 2011-09-12 | Disposition: A | Discharge status: Home / Self Care | Payer: Medicare Other | Source: Ambulatory Visit | Attending: Urology | Admitting: Urology

## 2011-09-12 ENCOUNTER — Other Ambulatory Visit: Payer: Self-pay | Admitting: Urology

## 2011-09-12 DIAGNOSIS — N200 Calculus of kidney: Secondary | ICD-10-CM | POA: Diagnosis not present

## 2011-09-12 DIAGNOSIS — R82998 Other abnormal findings in urine: Secondary | ICD-10-CM | POA: Diagnosis not present

## 2011-09-12 DIAGNOSIS — R310 Gross hematuria: Secondary | ICD-10-CM

## 2011-09-12 DIAGNOSIS — Z79899 Other long term (current) drug therapy: Secondary | ICD-10-CM | POA: Diagnosis not present

## 2011-09-12 DIAGNOSIS — R300 Dysuria: Secondary | ICD-10-CM | POA: Diagnosis not present

## 2011-09-12 DIAGNOSIS — K5730 Diverticulosis of large intestine without perforation or abscess without bleeding: Secondary | ICD-10-CM | POA: Diagnosis not present

## 2011-09-12 DIAGNOSIS — C61 Malignant neoplasm of prostate: Secondary | ICD-10-CM | POA: Diagnosis not present

## 2011-09-12 MED ORDER — IOHEXOL 300 MG/ML  SOLN
125.0000 mL | Freq: Once | INTRAMUSCULAR | Status: AC | PRN
  Administered 2011-09-12: 125 mL via INTRAVENOUS

## 2011-09-15 DIAGNOSIS — R319 Hematuria, unspecified: Secondary | ICD-10-CM | POA: Diagnosis not present

## 2011-09-15 DIAGNOSIS — N390 Urinary tract infection, site not specified: Secondary | ICD-10-CM | POA: Diagnosis not present

## 2011-09-21 DIAGNOSIS — R319 Hematuria, unspecified: Secondary | ICD-10-CM | POA: Diagnosis not present

## 2011-09-21 DIAGNOSIS — K869 Disease of pancreas, unspecified: Secondary | ICD-10-CM | POA: Diagnosis not present

## 2011-10-02 ENCOUNTER — Encounter (HOSPITAL_COMMUNITY): Payer: Self-pay

## 2011-10-02 ENCOUNTER — Emergency Department (HOSPITAL_COMMUNITY)
Admission: EM | Admit: 2011-10-02 | Discharge: 2011-10-02 | Disposition: A | Discharge status: Home / Self Care | Payer: Medicare Other | Attending: Emergency Medicine | Admitting: Emergency Medicine

## 2011-10-02 DIAGNOSIS — I2510 Atherosclerotic heart disease of native coronary artery without angina pectoris: Secondary | ICD-10-CM | POA: Insufficient documentation

## 2011-10-02 DIAGNOSIS — Z951 Presence of aortocoronary bypass graft: Secondary | ICD-10-CM | POA: Diagnosis not present

## 2011-10-02 DIAGNOSIS — E785 Hyperlipidemia, unspecified: Secondary | ICD-10-CM | POA: Diagnosis not present

## 2011-10-02 DIAGNOSIS — F0390 Unspecified dementia without behavioral disturbance: Secondary | ICD-10-CM | POA: Insufficient documentation

## 2011-10-02 DIAGNOSIS — Z8546 Personal history of malignant neoplasm of prostate: Secondary | ICD-10-CM | POA: Diagnosis not present

## 2011-10-02 DIAGNOSIS — N390 Urinary tract infection, site not specified: Secondary | ICD-10-CM | POA: Insufficient documentation

## 2011-10-02 DIAGNOSIS — L509 Urticaria, unspecified: Secondary | ICD-10-CM | POA: Diagnosis not present

## 2011-10-02 DIAGNOSIS — Z87891 Personal history of nicotine dependence: Secondary | ICD-10-CM | POA: Diagnosis not present

## 2011-10-02 LAB — URINE MICROSCOPIC-ADD ON

## 2011-10-02 LAB — URINALYSIS, ROUTINE W REFLEX MICROSCOPIC
Bilirubin Urine: NEGATIVE
Glucose, UA: NEGATIVE mg/dL
Ketones, ur: NEGATIVE mg/dL
Nitrite: NEGATIVE
Protein, ur: NEGATIVE mg/dL
Specific Gravity, Urine: 1.027 (ref 1.005–1.030)
Urobilinogen, UA: 1 mg/dL (ref 0.0–1.0)
pH: 5.5 (ref 5.0–8.0)

## 2011-10-02 MED ORDER — DIPHENHYDRAMINE HCL 25 MG PO TABS: 50.0000 mg | ORAL_TABLET | Freq: Four times a day (QID) | ORAL | Status: DC

## 2011-10-02 MED ORDER — DIPHENHYDRAMINE HCL 25 MG PO CAPS
50.0000 mg | ORAL_CAPSULE | Freq: Once | ORAL | Status: AC
  Administered 2011-10-02: 50 mg via ORAL
  Filled 2011-10-02: qty 2

## 2011-10-02 MED ORDER — PREDNISONE 20 MG PO TABS
60.0000 mg | ORAL_TABLET | Freq: Once | ORAL | Status: AC
  Administered 2011-10-02: 60 mg via ORAL
  Filled 2011-10-02: qty 3

## 2011-10-02 MED ORDER — PREDNISONE 50 MG PO TABS: 50.0000 mg | ORAL_TABLET | Freq: Every day | ORAL | Status: DC

## 2011-10-02 MED ORDER — CEPHALEXIN 500 MG PO CAPS: 500.0000 mg | ORAL_CAPSULE | Freq: Four times a day (QID) | ORAL | Status: AC

## 2011-10-02 NOTE — ED Provider Notes (Signed)
History     CSN: [id number]  Arrival date & time 10/02/11  [id number]   First MD Initiated Contact with Patient 10/02/11 [phone]      Chief Complaint  Patient presents with  . Urticaria    (Consider location/radiation/quality/duration/timing/severity/associated sxs/prior treatment) HPI Pt presents with c/o ithcing rash/hive.  Symptoms started yesterday and worse this morning when he woke up.  Rash on legs, and torso/chest.  No lip or tongue swelling, no shortness of breath.  Pt is currently being treated for UTI with macrobid which he has been on approx 1 week.  He has no other known exposures or new medications.  He has not taken anything for his symptoms prior to arrival.  There are no other associated systemic symptoms, there are no other alleviating or modifying factors.   Past Medical History  Diagnosis Date  . Coronary artery disease     11/89 CABG  (70% circ; 90% PD; 60-70% distal left main; 30% left circ; 90% 1st diag;, 2nd diag and 3rd diag. w/90% mild LAD stenosis and 60-70% prox. stenosisin the LAD)  . Kidney stone     1970  . S/P T&A (status post tonsillectomy and adenoidectomy)     1954  . Cancer     prostate cancer and seed implant 1998  . Hyperlipidemia   . Dementia     mild  . Normal nuclear stress test July 2011    Past Surgical History  Procedure Date  . Coronary artery bypass graft     11/89 CABG  (70% circ; 90% PD; 60-70% distal left main; 30% left circ; 90% 1st diag;, 2nd diag and 3rd diag. w/90% mild LAD stenosis and 60-70% prox. stenosisin the LAD. Redo bypass in 1997    Family History  Problem Relation Age of Onset  . Heart disease Mother     History  Substance Use Topics  . Smoking status: Former Research scientist (life sciences)  . Smokeless tobacco: Not on file  . Alcohol Use: No      Review of Systems ROS reviewed and all otherwise negative except for mentioned in HPI  Allergies  Ciprofloxacin and Sulfonamide derivatives  Home Medications   Current Outpatient Rx    Name Route Sig Dispense Refill  . ACETAMINOPHEN 500 MG PO TABS Oral Take 500 mg by mouth every 6 (six) hours as needed. Take 1 -2 pills    . AMOXICILLIN 500 MG PO CAPS Oral Take 500 mg by mouth 3 (three) times daily.    Marland Kitchen CALCIUM + D PO Oral Take by mouth 2 (two) times daily.      Marland Kitchen GALANTAMINE HYDROBROMIDE 8 MG PO TABS Oral Take 8 mg by mouth 1 day or 1 dose.    . ISOSORBIDE MONONITRATE ER 30 MG PO TB24 Oral Take 30 mg by mouth daily.      Marland Kitchen LISINOPRIL 20 MG PO TABS Oral Take 20 mg by mouth daily.      Marland Kitchen ONE-DAILY MULTI VITAMINS PO TABS Oral Take 1 tablet by mouth daily.      Marland Kitchen NITROFURANTOIN MACROCRYSTAL 100 MG PO CAPS Oral Take 100 mg by mouth 4 (four) times daily.    Marland Kitchen NITROGLYCERIN 0.4 MG SL SUBL Sublingual Place 0.4 mg under the tongue every 5 (five) minutes as needed.      Marland Kitchen PHENAZOPYRIDINE HCL 200 MG PO TABS Oral Take 200 mg by mouth 3 (three) times daily as needed.    Marland Kitchen ROSUVASTATIN CALCIUM 10 MG PO TABS Oral Take 10 mg by  mouth daily.      . ASPIRIN 325 MG PO TABS Oral Take 325 mg by mouth daily.      . CEPHALEXIN 500 MG PO CAPS Oral Take 1 capsule (500 mg total) by mouth 4 (four) times daily. 40 capsule 0  . DIPHENHYDRAMINE HCL 25 MG PO TABS Oral Take 2 tablets (50 mg total) by mouth every 6 (six) hours. Take 1-2 tablets every 6 hours x 2 days, then space out to an as needed basis 20 tablet 0  . PREDNISONE 50 MG PO TABS Oral Take 1 tablet (50 mg total) by mouth daily. 4 tablet 0    BP 105/58  Pulse 56  Temp 97.6 F (36.4 C) (Oral)  Resp 18  Ht 5\' 6"  (1.676 m)  Wt 167 lb (75.751 kg)  BMI 26.95 kg/m2  SpO2 94% Vitals reviewed Physical Exam Physical Examination: General appearance - alert, well appearing, and in no distress Mental status - alert, oriented to person, place, and time Eyes - no scleral icterus, no conjunctival injection Mouth - mucous membranes moist, pharynx normal without lesions Chest - clear to auscultation, no wheezes, rales or rhonchi, symmetric air  entry Heart - normal rate, regular rhythm, normal S1, S2, no murmurs, rubs, clicks or gallops Abdomen - soft, nontender, nondistended, no masses or organomegaly Musculoskeletal - no joint tenderness, deformity or swelling Extremities - peripheral pulses normal, no pedal edema, no clubbing or cyanosis Skin - normal turgor, urticarial rash over bilateral thighs, anterior chest  ED Course  Procedures (including critical care time)  Labs Reviewed  URINALYSIS, ROUTINE W REFLEX MICROSCOPIC - Abnormal; Notable for the following:    Color, Urine AMBER (*)  BIOCHEMICALS MAY BE AFFECTED BY COLOR   APPearance CLOUDY (*)     Hgb urine dipstick LARGE (*)     Leukocytes, UA SMALL (*)     All other components within normal limits  URINE MICROSCOPIC-ADD ON  LAB REPORT - SCANNED   No results found.   1. Urticaria       MDM  Pt presenting with urticaria of legs and trunk- no lip/tongue swelling or shortness of breath.  Pt taking macrobid for recent UTI.  Has allergy to sulfa drugs and states cipro "doesnt work for him"- but not true allergy.  Urinalysis shows microscopic hematuria which may be c/w residual infection, so recommended stopping macrobid- will rx keflex.  Urine culture sent.  Pt agreeable with plan to contact his urologist as well to let him know about symptoms.  Discharged with strict return precautions.  Pt agreeable with plan.        Threasa Beards, MD 10/03/11 2325

## 2011-10-02 NOTE — ED Notes (Signed)
Pt in from home with hives to the upper chest and legs states very itchy states first noted yesterday at bed time states spread more this am states started a new medication macrobid for UTI denies difficulty breathing airway intact no apparent distress

## 2011-10-05 ENCOUNTER — Ambulatory Visit (HOSPITAL_COMMUNITY)
Admission: RE | Admit: 2011-10-05 | Discharge: 2011-10-05 | Disposition: A | Discharge status: Home / Self Care | Payer: Medicare Other | Source: Ambulatory Visit | Attending: Gastroenterology | Admitting: Gastroenterology

## 2011-10-05 ENCOUNTER — Encounter (HOSPITAL_COMMUNITY): Payer: Self-pay | Admitting: *Deleted

## 2011-10-05 ENCOUNTER — Encounter (HOSPITAL_COMMUNITY): Payer: Self-pay | Admitting: Anesthesiology

## 2011-10-05 ENCOUNTER — Encounter (HOSPITAL_COMMUNITY)
Admission: RE | Disposition: A | Discharge status: Home / Self Care | Payer: Self-pay | Source: Ambulatory Visit | Attending: Gastroenterology

## 2011-10-05 ENCOUNTER — Ambulatory Visit (HOSPITAL_COMMUNITY): Payer: Medicare Other | Admitting: Anesthesiology

## 2011-10-05 DIAGNOSIS — Z8546 Personal history of malignant neoplasm of prostate: Secondary | ICD-10-CM | POA: Diagnosis not present

## 2011-10-05 DIAGNOSIS — N200 Calculus of kidney: Secondary | ICD-10-CM | POA: Insufficient documentation

## 2011-10-05 DIAGNOSIS — K869 Disease of pancreas, unspecified: Secondary | ICD-10-CM | POA: Insufficient documentation

## 2011-10-05 DIAGNOSIS — K5730 Diverticulosis of large intestine without perforation or abscess without bleeding: Secondary | ICD-10-CM | POA: Insufficient documentation

## 2011-10-05 DIAGNOSIS — R310 Gross hematuria: Secondary | ICD-10-CM | POA: Diagnosis not present

## 2011-10-05 DIAGNOSIS — R933 Abnormal findings on diagnostic imaging of other parts of digestive tract: Secondary | ICD-10-CM | POA: Diagnosis not present

## 2011-10-05 DIAGNOSIS — K862 Cyst of pancreas: Secondary | ICD-10-CM | POA: Diagnosis not present

## 2011-10-05 DIAGNOSIS — K4090 Unilateral inguinal hernia, without obstruction or gangrene, not specified as recurrent: Secondary | ICD-10-CM | POA: Diagnosis not present

## 2011-10-05 DIAGNOSIS — Z9089 Acquired absence of other organs: Secondary | ICD-10-CM | POA: Diagnosis not present

## 2011-10-05 DIAGNOSIS — K863 Pseudocyst of pancreas: Secondary | ICD-10-CM | POA: Diagnosis not present

## 2011-10-05 DIAGNOSIS — I2510 Atherosclerotic heart disease of native coronary artery without angina pectoris: Secondary | ICD-10-CM | POA: Diagnosis not present

## 2011-10-05 DIAGNOSIS — Z87442 Personal history of urinary calculi: Secondary | ICD-10-CM | POA: Diagnosis not present

## 2011-10-05 DIAGNOSIS — D490 Neoplasm of unspecified behavior of digestive system: Secondary | ICD-10-CM | POA: Diagnosis not present

## 2011-10-05 HISTORY — PX: EUS: SHX5427

## 2011-10-05 HISTORY — DX: Acute myocardial infarction, unspecified: I21.9

## 2011-10-05 SURGERY — ESOPHAGEAL ENDOSCOPIC ULTRASOUND (EUS) RADIAL
Anesthesia: Monitor Anesthesia Care

## 2011-10-05 MED ORDER — LACTATED RINGERS IV SOLN
INTRAVENOUS | Status: DC | PRN
  Administered 2011-10-05: 13:00:00 via INTRAVENOUS

## 2011-10-05 MED ORDER — MIDAZOLAM HCL 5 MG/5ML IJ SOLN
INTRAMUSCULAR | Status: DC | PRN
  Administered 2011-10-05: 2 mg via INTRAVENOUS

## 2011-10-05 MED ORDER — FENTANYL CITRATE 0.05 MG/ML IJ SOLN
INTRAMUSCULAR | Status: DC | PRN
  Administered 2011-10-05: 50 ug via INTRAVENOUS

## 2011-10-05 MED ORDER — LABETALOL HCL 5 MG/ML IV SOLN
INTRAVENOUS | Status: DC | PRN
  Administered 2011-10-05: 5 mg via INTRAVENOUS

## 2011-10-05 MED ORDER — PROPOFOL 10 MG/ML IV EMUL
INTRAVENOUS | Status: DC | PRN
  Administered 2011-10-05: 300 ug/kg/min via INTRAVENOUS

## 2011-10-05 MED ORDER — BUTAMBEN-TETRACAINE-BENZOCAINE 2-2-14 % EX AERO
INHALATION_SPRAY | CUTANEOUS | Status: DC | PRN
  Administered 2011-10-05: 2 via TOPICAL

## 2011-10-05 MED ORDER — SODIUM CHLORIDE 0.9 % IV SOLN
1.5000 g | Freq: Once | INTRAVENOUS | Status: DC
  Filled 2011-10-05: qty 1.5

## 2011-10-05 MED ORDER — LACTATED RINGERS IV SOLN
INTRAVENOUS | Status: DC
  Administered 2011-10-05: 1000 mL via INTRAVENOUS

## 2011-10-05 NOTE — H&P (View-Only) (Signed)
  Subjective:    Patient ID: Ronald [name], male    DOB: 1928/01/23, 78 y.o.   MRN: [id number]  HPI 79 year old patient who has a history of nephrolithiasis and also remote history of prostate cancer status post seed implantation 1997. He was seen here a few days ago with burning dysuria and UA revealed pyuria and hematuria he has been on amoxicillin. For the past 2 days he has had worsening dysuria penile pain and the onset of gross hematuria. Denies any flank pain fever or chills.   Review of Systems  Constitutional: Negative for fever, chills, appetite change and fatigue.  HENT: Negative for hearing loss, ear pain, congestion, sore throat, trouble swallowing, neck stiffness, dental problem, voice change and tinnitus.   Eyes: Negative for pain, discharge and visual disturbance.  Respiratory: Negative for cough, chest tightness, wheezing and stridor.   Cardiovascular: Negative for chest pain, palpitations and leg swelling.  Gastrointestinal: Negative for nausea, vomiting, abdominal pain, diarrhea, constipation, blood in stool and abdominal distention.  Genitourinary: Positive for dysuria, urgency, frequency, hematuria, difficulty urinating and penile pain. Negative for flank pain, discharge and genital sores.  Musculoskeletal: Negative for myalgias, back pain, joint swelling, arthralgias and gait problem.  Skin: Negative for rash.  Neurological: Negative for dizziness, syncope, speech difficulty, weakness, numbness and headaches.  Hematological: Negative for adenopathy. Does not bruise/bleed easily.  Psychiatric/Behavioral: Negative for behavioral problems and dysphoric mood. The patient is not nervous/anxious.        Objective:   Physical Exam  Constitutional: He appears well-developed and well-nourished. No distress.       No distress. Afebrile. Blood pressure low normal.          Assessment & Plan:   Gross hematuria. Historically it sounds like he may be passing clots as well.  We'll continue antibiotic therapy add Pyridium and oral analgesics and schedule for prompt urological evaluation

## 2011-10-05 NOTE — Op Note (Signed)
[facility] [place], [place]  [address]  ENDOSCOPIC ULTRASOUND PROCEDURE REPORT  PATIENT:  Ronald [name], Ronald [name]  MR#:  [id number] BIRTHDATE:  [birth year]  GENDER:  male  ENDOSCOPIST:  Arta Silence, MD REFERRED BY:  Bluford Kaufmann, M.D. Tresa Endo, M.D.  PROCEDURE DATE:  10/05/2011 PROCEDURE:  Upper EUS ASA CLASS:  Class II INDICATIONS:  pancreatic lesion seen on CT  MEDICATIONS:   MAC sedation, administered by CRNA, Cetacaine spray x 2  DESCRIPTION OF PROCEDURE:   After the risks benefits and alternatives of the procedure were  explained, informed consent was obtained. The patient was then placed in the left, lateral, decubitus postion and IV sedation was administered. Throughout the procedure, the patient's blood pressure, pulse and oxygen saturations were monitored continuously.  Under direct visualization, the Pentax EUS Linear [id number] endoscope was introduced through the mouth and advanced to the second portion of the duodenum.  Water was used as necessary to provide an acoustic interface.  Upon completion of the imaging, water was removed and the patient was sent to the recovery room in satisfactory condition.  <<PROCEDUREIMAGES>>  FINDINGS:  Body and tail of pancreas was normal.  At interface of head and uncinate pancreas, anechoic doppler-negative cystic structure, 8 x 12 mm in size, was seen.  No evidence of chronic pancreatitis was identified.  The identified cyst was deep to the bile and pancreatic ducts; FNA for cyst fluid analysis was felt to be risk-prohibitive.  There was suggestion of communication of the cyst with a prominent but otherwise normal-appearing pancreatic duct.  There was no septation, mural nodularity, or adenopathy.  ENDOSCOPIC IMPRESSION:    1.  Uncinate lesion seen on uncontrasted CT appears most consistent with cyst, specifically side- branch IMPN.  No solid lesion identified  RECOMMENDATIONS:      1.  Watch for  potential complications of procedure. 2.  Consider contrasted CT/MRI in 3-6 months for surveillance. 3.  Follow-up in office in 6-8 weeks.  ______________________________ Arta Silence  CC:  n. eSIGNEDArta Silence at 10/05/2011 01:49 PM  Geanie Berlin, [id number]

## 2011-10-05 NOTE — Interval H&P Note (Signed)
History and Physical Interval Note:  10/05/2011 12:51 PM  [name]  has presented today for surgery, with the diagnosis of pancreatic lesion  The various methods of treatment have been discussed with the patient and family. After consideration of risks, benefits and other options for treatment, the patient has consented to  Procedure(s) (LRB): ESOPHAGEAL ENDOSCOPIC ULTRASOUND (EUS) RADIAL (N/A) FINE NEEDLE ASPIRATION (FNA) LINEAR (N/A) as a surgical intervention .  The patient's history has been reviewed, patient examined, no change in status, stable for surgery.  I have reviewed the patients' chart and labs.  Questions were answered to the patient's satisfaction.     Andon Villard M  Assessment: Uncinate pancreatic lesion.  Plan: 1.  Endoscopic ultrasound with possible biopsy (fine needle aspiration, FNA) or cyst aspiration. 2.  Risks (bleeding, infection, bowel perforation that could require surgery, sedation-related changes in cardiopulmonary systems), benefits (identification and possible treatment of source of symptoms, exclusion of certain causes of symptoms), and alternatives (watchful waiting, radiographic imaging studies, empiric medical treatment) of upper endoscopy with ultrasound and possible biopsy (EUS +/- FNA) were explained to patient/wife in detail and patient wishes to proceed.

## 2011-10-05 NOTE — Anesthesia Preprocedure Evaluation (Signed)
Anesthesia Evaluation  Patient identified by MRN, date of birth, ID band Patient awake    Reviewed: Allergy & Precautions, H&P , NPO status , Patient's Chart, lab work & pertinent test results, reviewed documented beta blocker date and time   Airway Mallampati: II TM Distance: >3 FB Neck ROM: full    Dental No notable dental hx.    Pulmonary neg pulmonary ROS,  breath sounds clear to auscultation  Pulmonary exam normal       Cardiovascular Exercise Tolerance: Good hypertension, On Medications + CAD and + Past MI negative cardio ROS  Rhythm:regular Rate:Normal  Lexiscan from May 2013 reviewed.   Neuro/Psych PSYCHIATRIC DISORDERS negative neurological ROS     GI/Hepatic negative GI ROS, Neg liver ROS,   Endo/Other  negative endocrine ROS  Renal/GU negative Renal ROS  negative genitourinary   Musculoskeletal   Abdominal   Peds  Hematology negative hematology ROS (+)   Anesthesia Other Findings   Reproductive/Obstetrics negative OB ROS                           Anesthesia Physical Anesthesia Plan  ASA: III  Anesthesia Plan: MAC   Post-op Pain Management:    Induction:   Airway Management Planned:   Additional Equipment:   Intra-op Plan:   Post-operative Plan:   Informed Consent: I have reviewed the patients History and Physical, chart, labs and discussed the procedure including the risks, benefits and alternatives for the proposed anesthesia with the patient or authorized representative who has indicated his/her understanding and acceptance.   Dental Advisory Given  Plan Discussed with: CRNA  Anesthesia Plan Comments:         Anesthesia Quick Evaluation

## 2011-10-05 NOTE — Transfer of Care (Signed)
Immediate Anesthesia Transfer of Care Note  Patient: Ronald [name]  Procedure(s) Performed: Procedure(s) (LRB): ESOPHAGEAL ENDOSCOPIC ULTRASOUND (EUS) RADIAL (N/A)  Patient Location: PACU  Anesthesia Type: MAC  Level of Consciousness: awake, patient cooperative and responds to stimulation  Airway & Oxygen Therapy: Patient Spontanous Breathing and Patient connected to nasal cannula oxygen  Post-op Assessment: Report given to PACU RN, Post -op Vital signs reviewed and stable and Patient moving all extremities X 4  Post vital signs: stable  Complications: No apparent anesthesia complications

## 2011-10-06 NOTE — Anesthesia Postprocedure Evaluation (Signed)
  Anesthesia Post-op Note  Patient: Ronald [name]  Procedure(s) Performed: Procedure(s) (LRB): ESOPHAGEAL ENDOSCOPIC ULTRASOUND (EUS) RADIAL (N/A)  Patient Location: PACU  Anesthesia Type: MAC  Level of Consciousness: awake and alert   Airway and Oxygen Therapy: Patient Spontanous Breathing  Post-op Pain: mild  Post-op Assessment: Post-op Vital signs reviewed, Patient's Cardiovascular Status Stable, Respiratory Function Stable, Patent Airway and No signs of Nausea or vomiting  Post-op Vital Signs: stable  Complications: No apparent anesthesia complications

## 2011-10-13 ENCOUNTER — Telehealth: Payer: Self-pay | Admitting: Internal Medicine

## 2011-10-13 ENCOUNTER — Telehealth: Payer: Self-pay | Admitting: Cardiovascular Disease

## 2011-10-13 NOTE — Telephone Encounter (Signed)
This has been on going x2 wks, scheduled OV tomorrow 10/14/11 at 10:15.  Advised to go to ED if throat swelled and feels like its going to shut

## 2011-10-13 NOTE — Telephone Encounter (Signed)
Please return call to patient at [phone] to discuss medication.

## 2011-10-13 NOTE — Telephone Encounter (Signed)
Pt taking ABX for UTI.  Today c/o hives, itching and rash.  Advised to call PCP.  Pt agreed.

## 2011-10-13 NOTE — Telephone Encounter (Signed)
Caller: Shirley/Spouse; PCP: Bluford Kaufmann; CB#: [phone]; Wife calling today 10/13/11 regarding husband is taking Cephalexin 500 mg QID which he started on 10/02/11 capsule for UTI.  Started breaking out with hives before when he was on the [place], then they changed him 10/02/11 to the Cephalexin.  The hives have never completely gone away.  Hives are not worse than before, but very itchy.  Also having acid reflux symptoms and Tums have not helped. Says throat feels a little swollen and he is a little hoarse.  However pt states his throat has been this way since he had his endoscopy on 10/05/11.  No difficulty breathing at this time.  Emergent symptoms r/o by Hives guidelines with exception of new onset of hives after beginning new prescribed, nonprescribed or alternative/complementary medication. No appts available for Dr. Burnice Logan in [facility].  OFFICE PLEASE CALL PT BACK AT [phone] FOR WORK IN APPT.

## 2011-10-14 ENCOUNTER — Encounter: Payer: Self-pay | Admitting: Internal Medicine

## 2011-10-14 ENCOUNTER — Ambulatory Visit (INDEPENDENT_AMBULATORY_CARE_PROVIDER_SITE_OTHER): Payer: Medicare Other | Admitting: Internal Medicine

## 2011-10-14 VITALS — BP 100/70 | Temp 97.6°F | Wt 162.0 lb

## 2011-10-14 DIAGNOSIS — I10 Essential (primary) hypertension: Secondary | ICD-10-CM

## 2011-10-14 DIAGNOSIS — L509 Urticaria, unspecified: Secondary | ICD-10-CM | POA: Diagnosis not present

## 2011-10-14 DIAGNOSIS — IMO0002 Reserved for concepts with insufficient information to code with codable children: Secondary | ICD-10-CM

## 2011-10-14 DIAGNOSIS — I2510 Atherosclerotic heart disease of native coronary artery without angina pectoris: Secondary | ICD-10-CM

## 2011-10-14 MED ORDER — PREDNISONE 10 MG PO TABS: ORAL_TABLET | ORAL | Status: DC

## 2011-10-14 MED ORDER — METHYLPREDNISOLONE ACETATE 80 MG/ML IJ SUSP
80.0000 mg | Freq: Once | INTRAMUSCULAR | Status: AC
  Administered 2011-10-14: 80 mg via INTRAMUSCULAR

## 2011-10-14 NOTE — Progress Notes (Signed)
Subjective:    Patient ID: Ronald [name], male    DOB: 04-Sep-1927, 78 y.o.   MRN: [id number]  HPI  78 year old patient who is seen today for followup. For the past several days he said the eyes he's had some swelling difficulty and worsening reflux symptoms. Since his last visit here he is at upper panendoscopy and also a CT abdominal scan the 2 gross hematuria. He was seen in the ED 2 weeks ago for a hives as well. Is completing a course of cephalexin. His coronary artery disease and treated hypertension which have been stable.  Past Medical History  Diagnosis Date  . Coronary artery disease     11/89 CABG  (70% circ; 90% PD; 60-70% distal left main; 30% left circ; 90% 1st diag;, 2nd diag and 3rd diag. w/90% mild LAD stenosis and 60-70% prox. stenosisin the LAD)  . Kidney stone     1970  . S/P T&A (status post tonsillectomy and adenoidectomy)     1954  . Cancer     prostate cancer and seed implant 1998  . Hyperlipidemia   . Dementia     mild  . Normal nuclear stress test July 2011  . Myocardial infarction     History   Social History  . Marital Status: Married    Spouse Name: N/A    Number of Children: N/A  . Years of Education: N/A   Occupational History  . Not on file.   Social History Main Topics  . Smoking status: Former Research scientist (life sciences)  . Smokeless tobacco: Not on file  . Alcohol Use: No  . Drug Use: No  . Sexually Active: Not Currently   Other Topics Concern  . Not on file   Social History Narrative  . No narrative on file    Past Surgical History  Procedure Date  . Coronary artery bypass graft     11/89 CABG  (70% circ; 90% PD; 60-70% distal left main; 30% left circ; 90% 1st diag;, 2nd diag and 3rd diag. w/90% mild LAD stenosis and 60-70% prox. stenosisin the LAD. Redo bypass in 1997  . Tonsillectomy   . Cholecystectomy   . Eus 10/05/2011    Procedure: ESOPHAGEAL ENDOSCOPIC ULTRASOUND (EUS) RADIAL;  Surgeon: Arta Silence, MD;  Location: [facility];  Service:  Endoscopy;  Laterality: N/A;    Family History  Problem Relation Age of Onset  . Heart disease Mother     Allergies  Allergen Reactions  . Nitrofurantoin Hives  . Ciprofloxacin     REACTION: Swelling Pain  . Sulfonamide Derivatives     REACTION: Hives    Current Outpatient Prescriptions on File Prior to Visit  Medication Sig Dispense Refill  . acetaminophen (TYLENOL) 500 MG tablet Take 500 mg by mouth every 6 (six) hours as needed. Take 1 -2 pills      . amoxicillin (AMOXIL) 500 MG capsule Take 500 mg by mouth 3 (three) times daily.      Marland Kitchen aspirin 325 MG tablet Take 325 mg by mouth daily.        . Calcium Carbonate-Vitamin D (CALCIUM + D PO) Take by mouth 2 (two) times daily.        . diphenhydrAMINE (BENADRYL) 25 MG tablet Take 2 tablets (50 mg total) by mouth every 6 (six) hours. Take 1-2 tablets every 6 hours x 2 days, then space out to an as needed basis  20 tablet  0  . galantamine (RAZADYNE) 8 MG tablet Take 8 mg  by mouth 1 day or 1 dose.      . isosorbide mononitrate (IMDUR) 30 MG 24 hr tablet Take 30 mg by mouth daily.        Marland Kitchen lisinopril (PRINIVIL,ZESTRIL) 20 MG tablet Take 20 mg by mouth daily.        . Multiple Vitamin (MULTIVITAMIN) tablet Take 1 tablet by mouth daily.        . nitroGLYCERIN (NITROSTAT) 0.4 MG SL tablet Place 0.4 mg under the tongue every 5 (five) minutes as needed.        . phenazopyridine (PYRIDIUM) 200 MG tablet Take 200 mg by mouth 3 (three) times daily as needed.      . predniSONE (DELTASONE) 50 MG tablet Take 1 tablet (50 mg total) by mouth daily.  4 tablet  0  . rosuvastatin (CRESTOR) 10 MG tablet Take 10 mg by mouth daily.          BP 100/70  Temp 97.6 F (36.4 C) (Oral)  Wt 162 lb (73.483 kg)       Review of Systems  Constitutional: Negative for fever, chills, appetite change and fatigue.  HENT: Negative for hearing loss, ear pain, congestion, sore throat, trouble swallowing, neck stiffness, dental problem, voice change and  tinnitus.   Eyes: Negative for pain, discharge and visual disturbance.  Respiratory: Negative for cough, chest tightness, wheezing and stridor.   Cardiovascular: Negative for chest pain, palpitations and leg swelling.  Gastrointestinal: Negative for nausea, vomiting, abdominal pain, diarrhea, constipation, blood in stool and abdominal distention.  Genitourinary: Negative for urgency, hematuria, flank pain, discharge, difficulty urinating and genital sores.  Musculoskeletal: Negative for myalgias, back pain, joint swelling, arthralgias and gait problem.  Skin: Positive for rash.  Neurological: Negative for dizziness, syncope, speech difficulty, weakness, numbness and headaches.  Hematological: Negative for adenopathy. Does not bruise/bleed easily.  Psychiatric/Behavioral: Negative for behavioral problems and dysphoric mood. The patient is not nervous/anxious.        Objective:   Physical Exam  Constitutional: He appears well-developed and well-nourished. No distress.       No distress. Blood pressure low normal. Afebrile  Cardiovascular: Normal rate and regular rhythm.   Pulmonary/Chest: Effort normal and breath sounds normal. No respiratory distress. He has no wheezes. He has no rales.  Skin:       Urticarial rash over the trunk primarily          Assessment & Plan:   Hives. He has completed of cephalexin therapy yesterday this will be discontinued. He be treated with Depo-Medrol 80 mg IM and continued on the Benadryl. If symptoms do not improve or recur will treat with a oral prednisone regimen. Gastroesophageal reflux disease. Will treat with anti-reflux regimen and short-term PPI therapy. He is scheduled for GI followup soon

## 2011-10-14 NOTE — Patient Instructions (Addendum)
Avoids foods high in acid such as tomatoes citrus juices, and spicy foods.  Avoid eating within two hours of lying down or before exercising.  Do not overheat.  Try smaller more frequent meals.  If symptoms persist, elevate the head of her bed 12 inches while sleeping.  Zegerid one daily  Gastroenterology followup as scheduled  Call or return to clinic prn if these symptoms worsen or fail to improve as anticipated. Hives Hives are itchy, red, puffy patches on the skin. Hives may change in size and shape. They may also show up in new places on the body. Hives may be a reaction to something that was eaten, touched, or put on the skin. Hives can also be a reaction to cold, heat, infections, medicine, insect bites, or stress. Hives cannot spread from one person to another. HOME CARE  Stay away from what caused the hives, if this applies.   To relieve itching and rash:   Apply cold packs to the skin. A cool bath may also help. Do not take hot baths or showers. Warm water makes the itching worse.   Take medicine to shrink the hives as told by your doctor. This medicine can make you sleepy. Do not drive if you take this medicine.   Only take medicine as told by your doctor.   Wear loose-fitting clothes and underwear.   Follow up with your doctor.  GET HELP RIGHT AWAY IF:    You have a fever.   Your lips or tongue puff up (swell).   Breathing or swallowing is hard to do.   Throat or chest tightness develops.   Belly (abdominal) pain develops.  These may be the first signs of a life-threatening allergic reaction. This is an emergency. Call your local emergency services (911 in [place] MAKE SURE YOU:    Understand these instructions.   Will watch your condition.   Will get help right away if you are not doing well or get worse.  Document Released: 12/22/2007 Document Revised: 03/03/2011 Document Reviewed: 02/17/2009 [facility] Patient Information 2012 [place].

## 2011-11-14 ENCOUNTER — Other Ambulatory Visit: Payer: Self-pay | Admitting: Gastroenterology

## 2011-11-14 DIAGNOSIS — K869 Disease of pancreas, unspecified: Secondary | ICD-10-CM | POA: Diagnosis not present

## 2011-11-14 DIAGNOSIS — R935 Abnormal findings on diagnostic imaging of other abdominal regions, including retroperitoneum: Secondary | ICD-10-CM

## 2011-11-14 DIAGNOSIS — R634 Abnormal weight loss: Secondary | ICD-10-CM

## 2011-11-16 ENCOUNTER — Ambulatory Visit
Admission: RE | Admit: 2011-11-16 | Discharge: 2011-11-16 | Disposition: A | Discharge status: Home / Self Care | Payer: Medicare Other | Source: Ambulatory Visit | Attending: Gastroenterology | Admitting: Gastroenterology

## 2011-11-16 DIAGNOSIS — R935 Abnormal findings on diagnostic imaging of other abdominal regions, including retroperitoneum: Secondary | ICD-10-CM

## 2011-11-16 DIAGNOSIS — R634 Abnormal weight loss: Secondary | ICD-10-CM | POA: Diagnosis not present

## 2011-11-16 MED ORDER — GADOBENATE DIMEGLUMINE 529 MG/ML IV SOLN
15.0000 mL | Freq: Once | INTRAVENOUS | Status: AC | PRN
  Administered 2011-11-16: 15 mL via INTRAVENOUS

## 2011-11-21 DIAGNOSIS — N200 Calculus of kidney: Secondary | ICD-10-CM | POA: Diagnosis not present

## 2011-11-21 DIAGNOSIS — R3915 Urgency of urination: Secondary | ICD-10-CM | POA: Diagnosis not present

## 2011-11-21 DIAGNOSIS — C61 Malignant neoplasm of prostate: Secondary | ICD-10-CM | POA: Diagnosis not present

## 2011-11-21 DIAGNOSIS — R300 Dysuria: Secondary | ICD-10-CM | POA: Diagnosis not present

## 2011-11-21 DIAGNOSIS — R350 Frequency of micturition: Secondary | ICD-10-CM | POA: Diagnosis not present

## 2011-12-19 DIAGNOSIS — K862 Cyst of pancreas: Secondary | ICD-10-CM | POA: Diagnosis not present

## 2011-12-19 DIAGNOSIS — K863 Pseudocyst of pancreas: Secondary | ICD-10-CM | POA: Diagnosis not present

## 2012-02-07 ENCOUNTER — Encounter: Payer: Self-pay | Admitting: Cardiovascular Disease

## 2012-02-07 ENCOUNTER — Ambulatory Visit (INDEPENDENT_AMBULATORY_CARE_PROVIDER_SITE_OTHER): Payer: Medicare Other | Admitting: Cardiovascular Disease

## 2012-02-07 VITALS — BP 115/58 | HR 50 | Ht 65.0 in | Wt 164.0 lb

## 2012-02-07 DIAGNOSIS — I2510 Atherosclerotic heart disease of native coronary artery without angina pectoris: Secondary | ICD-10-CM

## 2012-02-07 NOTE — Progress Notes (Signed)
History of Present Illness: 78 yo male with history of CAD s/p CABG in 1989 and redo bypass in 1997 per Dr. Redmond Pulling, nephrolithiasis, HTN, HLD, mild dementia, prostate cancer here today for cardiac follow up. He has been followed in the past by Dr. Doreatha Lew. He was seen by Truitt Merle, NP in July 2012.  I saw him last in May 2013. Earlier this year, he did describe SOB and fatigue. I arranged a Lexiscan myoview given his dyspnea and fatigue and h/o CAD. This showed inferior scar with soft tissue attenuation and small region of anterior scar with possible small area of ischemia. This was felt to be low risk.   He is here today for follow up.  He has no exertional chest pain or pressure. He has had recent issues with bladder infections and reaction to medications for his urinary issues. He is reporting that the [place] will change his Crestor to Lipitor. He does continue to have dyspnea with exertion. He has not  Primary Care Physician: Dr. Burnice Logan  Last Lipid Profile:Lipid Panel     Component Value Date/Time   CHOL 152 07/07/2011 0931   TRIG 48.0 07/07/2011 0931   HDL 55.40 07/07/2011 0931   CHOLHDL 3 07/07/2011 0931   VLDL 9.6 07/07/2011 0931   LDLCALC 87 07/07/2011 0931     Past Medical History  Diagnosis Date  . Coronary artery disease     11/89 CABG  (70% circ; 90% PD; 60-70% distal left main; 30% left circ; 90% 1st diag;, 2nd diag and 3rd diag. w/90% mild LAD stenosis and 60-70% prox. stenosisin the LAD)  . Kidney stone     1970  . S/P T&A (status post tonsillectomy and adenoidectomy)     1954  . Cancer     prostate cancer and seed implant 1998  . Hyperlipidemia   . Dementia     mild  . Normal nuclear stress test July 2011  . Myocardial infarction     Past Surgical History  Procedure Date  . Coronary artery bypass graft     11/89 CABG  (70% circ; 90% PD; 60-70% distal left main; 30% left circ; 90% 1st diag;, 2nd diag and 3rd diag. w/90% mild LAD stenosis and 60-70% prox.  stenosisin the LAD. Redo bypass in 1997  . Tonsillectomy   . Cholecystectomy   . Eus 10/05/2011    Procedure: ESOPHAGEAL ENDOSCOPIC ULTRASOUND (EUS) RADIAL;  Surgeon: Arta Silence, MD;  Location: [facility];  Service: Endoscopy;  Laterality: N/A;    Current Outpatient Prescriptions  Medication Sig Dispense Refill  . acetaminophen (TYLENOL) 500 MG tablet Take 500 mg by mouth every 6 (six) hours as needed. Take 1 -2 pills      . aspirin 325 MG tablet Take 325 mg by mouth daily.        . Calcium Carbonate-Vitamin D (CALCIUM + D PO) Take by mouth 2 (two) times daily.        Marland Kitchen galantamine (RAZADYNE) 8 MG tablet Take 8 mg by mouth 1 day or 1 dose.      . isosorbide mononitrate (IMDUR) 30 MG 24 hr tablet Take 30 mg by mouth daily.        Marland Kitchen lisinopril (PRINIVIL,ZESTRIL) 20 MG tablet Take 20 mg by mouth daily.        . Multiple Vitamin (MULTIVITAMIN) tablet Take 1 tablet by mouth daily.        . nitroGLYCERIN (NITROSTAT) 0.4 MG SL tablet Place 0.4 mg under the tongue  every 5 (five) minutes as needed.        . rosuvastatin (CRESTOR) 10 MG tablet Take 10 mg by mouth daily.        . solifenacin (VESICARE) 5 MG tablet Take 5 mg by mouth daily.        Allergies  Allergen Reactions  . Ciprofloxacin Swelling         . Nitrofurantoin Hives  . Sulfonamide Derivatives Hives         History   Social History  . Marital Status: Married    Spouse Name: N/A    Number of Children: N/A  . Years of Education: N/A   Occupational History  . Not on file.   Social History Main Topics  . Smoking status: Former Research scientist (life sciences)  . Smokeless tobacco: Not on file  . Alcohol Use: No  . Drug Use: No  . Sexually Active: Not Currently   Other Topics Concern  . Not on file   Social History Narrative  . No narrative on file    Family History  Problem Relation Age of Onset  . Heart disease Mother     Review of Systems:  As stated in the HPI and otherwise negative.   BP 115/58  Pulse 50  Ht 5\' 5"   (1.651 m)  Wt 164 lb (74.39 kg)  BMI 27.29 kg/m2  Physical Examination: General: Well developed, well nourished, NAD HEENT: OP clear, mucus membranes moist SKIN: warm, dry. No rashes. Neuro: No focal deficits Musculoskeletal: Muscle strength 5/5 all ext Psychiatric: Mood and affect normal Neck: No JVD, no carotid bruits, no thyromegaly, no lymphadenopathy. Lungs:Clear bilaterally, no wheezes, rhonci, crackles Cardiovascular: Regular rate and rhythm. No murmurs, gallops or rubs. Abdomen:Soft. Bowel sounds present. Non-tender.  Extremities: No lower extremity edema. Pulses are 2 + in the bilateral DP/PT.   Assessment and Plan:   1. CORONARY ARTERY DISEASE: Stable. Low risk stress test  May 2013. No changes in cardiac meds. Continue statin, ASA, Ace-inh, Imdur. No beta blocker secondary to bradycardia.

## 2012-02-07 NOTE — Patient Instructions (Addendum)
Your physician wants you to follow-up in:  6 months. You will receive a reminder letter in the mail two months in advance. If you don't receive a letter, please call our office to schedule the follow-up appointment.   

## 2012-03-15 ENCOUNTER — Encounter: Payer: Self-pay | Admitting: Internal Medicine

## 2012-03-15 ENCOUNTER — Ambulatory Visit (INDEPENDENT_AMBULATORY_CARE_PROVIDER_SITE_OTHER): Payer: Medicare Other | Admitting: Internal Medicine

## 2012-03-15 VITALS — BP 120/84 | HR 68 | Temp 98.6°F | Resp 16 | Ht 65.0 in | Wt 165.0 lb

## 2012-03-15 DIAGNOSIS — Z85828 Personal history of other malignant neoplasm of skin: Secondary | ICD-10-CM

## 2012-03-15 DIAGNOSIS — L570 Actinic keratosis: Secondary | ICD-10-CM | POA: Diagnosis not present

## 2012-03-15 DIAGNOSIS — L989 Disorder of the skin and subcutaneous tissue, unspecified: Secondary | ICD-10-CM | POA: Diagnosis not present

## 2012-03-15 MED ORDER — MUPIROCIN 2 % EX OINT: TOPICAL_OINTMENT | Freq: Three times a day (TID) | CUTANEOUS | Status: DC

## 2012-03-15 NOTE — Patient Instructions (Signed)
Actinic Keratosis Actinic keratosis is a precancerous growth on the skin. This means it could develop into skin cancer if it is not treated. About 1% of actinic keratoses turn into skin cancer within a year. It is important to have all such growths removed to prevent them from developing into skin cancer. CAUSES  Actinic keratosis is caused by getting too much ultraviolet (UV) radiation from the sun or other UV light sources. RISK FACTORS Factors that increase your chances of getting actinic keratosis include:  Having light-colored skin and blue eyes.  Having blonde or red hair.  Spending a lot of time in the sun.  Age. The risk of actinic keratosis increases with age. SYMPTOMS  Actinic keratosis growths look like scaly, rough spots of skin. They can be as small as a pinhead or as big as a quarter. They may itch, hurt, or feel sensitive. Sometimes there is a little tag of pink or gray skin growing off them. In some cases, actinic keratoses are easier felt than seen. They do not go away with the use of moisturizing lotions or creams. Actinic keratoses appear most often on areas of skin that get a lot of sun exposure. These areas include the:  Scalp.  Face.  Ears.  Lips.  Upper back.  Backs of the hands.  Forearms. DIAGNOSIS  Your caregiver can usually tell what is wrong by performing a physical exam. A tissue sample (biopsy) may also be taken and examined under a microscope. TREATMENT  Actinic keratosis can be treated several ways. Most treatments can be done in your caregiver's office. Treatment options may include:  Curettage. A tool is used to gently scrape off the growth.  Cryosurgery. Liquid nitrogen is applied to the growth to freeze it. The growth eventually falls off the skin.  Medicated creams, such as 5-fluorouracil or imiquimod. The medicine destroys the cells in the growth.  Chemical peels. Chemicals are applied to the growth and the outer layers of skin are peeled  off.  Photodynamic therapy. A drug that makes your skin more sensitive to light is applied to the skin. A strong, blue light is aimed at the skin and destroys the growth. PREVENTION  To prevent future sun damage:  Try to avoid the sun between 10:00 a.m. and 4:00 p.m. when it is the strongest.  Use a sunscreen or sunblock with SPF 30 or greater.  Apply sunscreen at least 30 minutes before exposure to the sun.  Always wear protective hats, clothing, and sunglasses with UV protection.  Avoid medicines, herbs, and foods that increase your sensitivity to sunlight.  Avoid tanning beds. HOME CARE INSTRUCTIONS   If your skin was covered with a bandage, change and remove the bandage as directed by your caregiver.  Keep the treated area dry as directed by your caregiver.  Apply any creams as prescribed by your caregiver. Follow the directions carefully.  Check your skin regularly for any changes.  Visit a skin doctor (dermatologist) every year for a skin exam. SEEK MEDICAL CARE IF:   Your skin does not heal and becomes irritated, red, or bleeds.  You notice any changes or new growths on your skin. Document Released: 06/10/2008 Document Revised: 06/06/2011 Document Reviewed: 04/25/2011 [facility] Patient Information 2013 [place].

## 2012-03-15 NOTE — Progress Notes (Signed)
  Subjective:    Patient ID: Ronald [name], male    DOB: 01-09-1928, 78 y.o.   MRN: [id number]  HPI New growth on scalp, painful Many actinics scalp and arms Needs new dermatologist   Review of Systems     Objective:   Physical Exam Actnics arms over 100 each Scalp lesion probably cancer        Assessment & Plan:  Refer to dermatology

## 2012-05-08 ENCOUNTER — Other Ambulatory Visit: Payer: Self-pay | Admitting: Dermatology

## 2012-05-08 DIAGNOSIS — C4442 Squamous cell carcinoma of skin of scalp and neck: Secondary | ICD-10-CM | POA: Diagnosis not present

## 2012-05-08 DIAGNOSIS — L570 Actinic keratosis: Secondary | ICD-10-CM | POA: Diagnosis not present

## 2012-05-08 DIAGNOSIS — L259 Unspecified contact dermatitis, unspecified cause: Secondary | ICD-10-CM | POA: Diagnosis not present

## 2012-05-17 DIAGNOSIS — R310 Gross hematuria: Secondary | ICD-10-CM | POA: Diagnosis not present

## 2012-05-17 DIAGNOSIS — N3020 Other chronic cystitis without hematuria: Secondary | ICD-10-CM | POA: Diagnosis not present

## 2012-05-17 DIAGNOSIS — N318 Other neuromuscular dysfunction of bladder: Secondary | ICD-10-CM | POA: Diagnosis not present

## 2012-05-17 DIAGNOSIS — C61 Malignant neoplasm of prostate: Secondary | ICD-10-CM | POA: Diagnosis not present

## 2012-05-17 DIAGNOSIS — N3000 Acute cystitis without hematuria: Secondary | ICD-10-CM | POA: Diagnosis not present

## 2012-05-18 ENCOUNTER — Encounter (HOSPITAL_COMMUNITY): Payer: Self-pay | Admitting: Emergency Medicine

## 2012-05-18 DIAGNOSIS — N390 Urinary tract infection, site not specified: Secondary | ICD-10-CM | POA: Diagnosis not present

## 2012-05-18 DIAGNOSIS — Z7982 Long term (current) use of aspirin: Secondary | ICD-10-CM | POA: Insufficient documentation

## 2012-05-18 DIAGNOSIS — R300 Dysuria: Secondary | ICD-10-CM | POA: Insufficient documentation

## 2012-05-18 DIAGNOSIS — R5383 Other fatigue: Secondary | ICD-10-CM | POA: Diagnosis not present

## 2012-05-18 DIAGNOSIS — Z87891 Personal history of nicotine dependence: Secondary | ICD-10-CM | POA: Insufficient documentation

## 2012-05-18 DIAGNOSIS — Z951 Presence of aortocoronary bypass graft: Secondary | ICD-10-CM | POA: Insufficient documentation

## 2012-05-18 DIAGNOSIS — Z79899 Other long term (current) drug therapy: Secondary | ICD-10-CM | POA: Insufficient documentation

## 2012-05-18 DIAGNOSIS — I252 Old myocardial infarction: Secondary | ICD-10-CM | POA: Insufficient documentation

## 2012-05-18 DIAGNOSIS — R5381 Other malaise: Secondary | ICD-10-CM | POA: Insufficient documentation

## 2012-05-18 DIAGNOSIS — A498 Other bacterial infections of unspecified site: Secondary | ICD-10-CM | POA: Diagnosis not present

## 2012-05-18 DIAGNOSIS — Z87442 Personal history of urinary calculi: Secondary | ICD-10-CM | POA: Insufficient documentation

## 2012-05-18 DIAGNOSIS — R109 Unspecified abdominal pain: Secondary | ICD-10-CM | POA: Insufficient documentation

## 2012-05-18 DIAGNOSIS — Z9889 Other specified postprocedural states: Secondary | ICD-10-CM | POA: Insufficient documentation

## 2012-05-18 DIAGNOSIS — I2510 Atherosclerotic heart disease of native coronary artery without angina pectoris: Secondary | ICD-10-CM | POA: Insufficient documentation

## 2012-05-18 DIAGNOSIS — E785 Hyperlipidemia, unspecified: Secondary | ICD-10-CM | POA: Insufficient documentation

## 2012-05-18 DIAGNOSIS — F0390 Unspecified dementia without behavioral disturbance: Secondary | ICD-10-CM | POA: Insufficient documentation

## 2012-05-18 DIAGNOSIS — Z8546 Personal history of malignant neoplasm of prostate: Secondary | ICD-10-CM | POA: Insufficient documentation

## 2012-05-18 DIAGNOSIS — R7881 Bacteremia: Secondary | ICD-10-CM | POA: Diagnosis not present

## 2012-05-18 DIAGNOSIS — R0989 Other specified symptoms and signs involving the circulatory and respiratory systems: Secondary | ICD-10-CM | POA: Diagnosis not present

## 2012-05-18 NOTE — ED Notes (Signed)
PT. REPORTS PROGRESSING GENERALIZED WEAKNESS FOR SEVERAL DAYS , ALSO REPORTS SEEN BY UROLOGIST YESTERDAY DUE TO DYSURIA , HISTORY OF UTI'S AND PROSTATE CANCER.

## 2012-05-19 ENCOUNTER — Emergency Department (HOSPITAL_COMMUNITY)
Admission: EM | Admit: 2012-05-19 | Discharge: 2012-05-19 | Disposition: A | Discharge status: Home / Self Care | Payer: Medicare Other | Attending: Emergency Medicine | Admitting: Emergency Medicine

## 2012-05-19 ENCOUNTER — Emergency Department (HOSPITAL_COMMUNITY): Payer: Medicare Other

## 2012-05-19 DIAGNOSIS — R0989 Other specified symptoms and signs involving the circulatory and respiratory systems: Secondary | ICD-10-CM | POA: Diagnosis not present

## 2012-05-19 DIAGNOSIS — R531 Weakness: Secondary | ICD-10-CM

## 2012-05-19 DIAGNOSIS — N390 Urinary tract infection, site not specified: Secondary | ICD-10-CM

## 2012-05-19 LAB — COMPREHENSIVE METABOLIC PANEL
ALT: 26 U/L (ref 0–53)
AST: 38 U/L — ABNORMAL HIGH (ref 0–37)
Albumin: 3.5 g/dL (ref 3.5–5.2)
Alkaline Phosphatase: 79 U/L (ref 39–117)
BUN: 33 mg/dL — ABNORMAL HIGH (ref 6–23)
CO2: 25 mEq/L (ref 19–32)
Calcium: 9.3 mg/dL (ref 8.4–10.5)
Chloride: 99 mEq/L (ref 96–112)
Creatinine, Ser: 1.41 mg/dL — ABNORMAL HIGH (ref 0.50–1.35)
GFR calc Af Amer: 52 mL/min — ABNORMAL LOW (ref >90)
GFR calc non Af Amer: 45 mL/min — ABNORMAL LOW (ref >90)
Glucose, Bld: 135 mg/dL — ABNORMAL HIGH (ref 70–99)
Potassium: 4.7 mEq/L (ref 3.5–5.1)
Sodium: 136 mEq/L (ref 135–145)
Total Bilirubin: 1.1 mg/dL (ref 0.3–1.2)
Total Protein: 6.9 g/dL (ref 6.0–8.3)

## 2012-05-19 LAB — CBC WITH DIFFERENTIAL/PLATELET
Basophils Absolute: 0 10*3/uL (ref 0.0–0.1)
Basophils Relative: 0 % (ref 0–1)
Eosinophils Absolute: 0 10*3/uL (ref 0.0–0.7)
Eosinophils Relative: 0 % (ref 0–5)
HCT: 40.9 % (ref 39.0–52.0)
Hemoglobin: 14.2 g/dL (ref 13.0–17.0)
Lymphocytes Relative: 4 % — ABNORMAL LOW (ref 12–46)
Lymphs Abs: 0.6 10*3/uL — ABNORMAL LOW (ref 0.7–4.0)
MCH: 31 pg (ref 26.0–34.0)
MCHC: 34.7 g/dL (ref 30.0–36.0)
MCV: 89.3 fL (ref 78.0–100.0)
Monocytes Absolute: 2 10*3/uL — ABNORMAL HIGH (ref 0.1–1.0)
Monocytes Relative: 16 % — ABNORMAL HIGH (ref 3–12)
Neutro Abs: 9.9 10*3/uL — ABNORMAL HIGH (ref 1.7–7.7)
Neutrophils Relative %: 80 % — ABNORMAL HIGH (ref 43–77)
Platelets: 97 10*3/uL — ABNORMAL LOW (ref 150–400)
RBC: 4.58 MIL/uL (ref 4.22–5.81)
RDW: 13.6 % (ref 11.5–15.5)
WBC: 12.4 10*3/uL — ABNORMAL HIGH (ref 4.0–10.5)

## 2012-05-19 LAB — URINALYSIS, ROUTINE W REFLEX MICROSCOPIC
Bilirubin Urine: NEGATIVE
Glucose, UA: NEGATIVE mg/dL
Ketones, ur: NEGATIVE mg/dL
Nitrite: NEGATIVE
Protein, ur: 30 mg/dL — AB
Specific Gravity, Urine: 1.018 (ref 1.005–1.030)
Urobilinogen, UA: 0.2 mg/dL (ref 0.0–1.0)
pH: 6 (ref 5.0–8.0)

## 2012-05-19 LAB — URINE MICROSCOPIC-ADD ON

## 2012-05-19 LAB — CG4 I-STAT (LACTIC ACID): Lactic Acid, Venous: 1.08 mmol/L (ref 0.5–2.2)

## 2012-05-19 LAB — POCT I-STAT TROPONIN I: Troponin i, poc: 0.05 ng/mL (ref 0.00–0.08)

## 2012-05-19 MED ORDER — DEXTROSE 5 % IV SOLN
1.0000 g | Freq: Once | INTRAVENOUS | Status: AC
  Administered 2012-05-19: 1 g via INTRAVENOUS
  Filled 2012-05-19: qty 10

## 2012-05-19 MED ORDER — ACETAMINOPHEN 325 MG PO TABS
650.0000 mg | ORAL_TABLET | Freq: Once | ORAL | Status: AC
  Administered 2012-05-19: 650 mg via ORAL
  Filled 2012-05-19: qty 2

## 2012-05-19 MED ORDER — SODIUM CHLORIDE 0.9 % IV BOLUS (SEPSIS)
500.0000 mL | Freq: Once | INTRAVENOUS | Status: AC
  Administered 2012-05-19: 500 mL via INTRAVENOUS

## 2012-05-19 MED ORDER — CEPHALEXIN 750 MG PO CAPS: 750.0000 mg | ORAL_CAPSULE | Freq: Two times a day (BID) | ORAL | Status: DC

## 2012-05-19 NOTE — ED Notes (Signed)
Pt drinking diet sprite without any difficulty.  Adult diaper and paper pants put on pt and pt ambulated in hallway with RN and wife.  Pt a little unsteady on feet in the beginning but then ambulated without difficulty.

## 2012-05-19 NOTE — ED Provider Notes (Signed)
History     CSN: [id number]  Arrival date & time 05/18/12  2237   First MD Initiated Contact with Patient 05/19/12 0125      Chief Complaint  Patient presents with  . Weakness    HPI Pt is a 79 yo M with PMH of CAD, MI, prostate cancer and frequent UTI presenting with progressively worsening diffuse weakness x24 hours. Pt states it started yesterday and he was seen at urology for concern for UTI, but was not treated with antibiotics. Then today, he had diffuse weakness and was unable to walk. He denies fevers, but has had some chills. He has never had anything like this before. He has been able to eat and drink with no problems. He has not missed any doses of his regular medications. Pt denies HA, vision changes, CP, SOB, rash. He does endorse suprapubic abd pain and dysuria.   Past Medical History  Diagnosis Date  . Coronary artery disease     11/89 CABG  (70% circ; 90% PD; 60-70% distal left main; 30% left circ; 90% 1st diag;, 2nd diag and 3rd diag. w/90% mild LAD stenosis and 60-70% prox. stenosisin the LAD)  . Kidney stone     1970  . S/P T&A (status post tonsillectomy and adenoidectomy)     1954  . Cancer     prostate cancer and seed implant 1998  . Hyperlipidemia   . Dementia     mild  . Normal nuclear stress test July 2011  . Myocardial infarction     Past Surgical History  Procedure Laterality Date  . Coronary artery bypass graft      11/89 CABG  (70% circ; 90% PD; 60-70% distal left main; 30% left circ; 90% 1st diag;, 2nd diag and 3rd diag. w/90% mild LAD stenosis and 60-70% prox. stenosisin the LAD. Redo bypass in 1997  . Tonsillectomy    . Cholecystectomy    . Eus  10/05/2011    Procedure: ESOPHAGEAL ENDOSCOPIC ULTRASOUND (EUS) RADIAL;  Surgeon: Arta Silence, MD;  Location: [facility];  Service: Endoscopy;  Laterality: N/A;    Family History  Problem Relation Age of Onset  . Heart disease Mother     History  Substance Use Topics  . Smoking status:  Former Research scientist (life sciences)  . Smokeless tobacco: Not on file  . Alcohol Use: No    Review of Systems  Constitutional: Negative for fever and chills.  HENT: Negative for congestion.   Respiratory: Negative for shortness of breath.   Cardiovascular: Negative for chest pain.  Gastrointestinal: Positive for abdominal pain (suprapubic). Negative for nausea and vomiting.  Genitourinary: Positive for dysuria and difficulty urinating. Negative for flank pain.  Musculoskeletal: Negative for back pain.  Skin: Negative for rash.  Neurological: Positive for weakness.  All other systems reviewed and are negative.    Allergies  Ciprofloxacin; Nitrofurantoin; and Sulfonamide derivatives  Home Medications   Current Outpatient Rx  Name  Route  Sig  Dispense  Refill  . acetaminophen (TYLENOL) 500 MG tablet   Oral   Take 500 mg by mouth every 6 (six) hours as needed for pain or fever. Take 1 -2 pills         . aspirin EC 325 MG tablet   Oral   Take 325 mg by mouth daily.         Marland Kitchen atorvastatin (LIPITOR) 80 MG tablet   Oral   Take 80 mg by mouth daily.         Marland Kitchen  Calcium Carbonate-Vitamin D (CALCIUM + D PO)   Oral   Take 1 tablet by mouth 2 (two) times daily.          Marland Kitchen galantamine (RAZADYNE) 8 MG tablet   Oral   Take 8 mg by mouth daily.          . hydroxypropyl methylcellulose (ISOPTO TEARS) 2.5 % ophthalmic solution   Both Eyes   Place 1 drop into both eyes daily.         . isosorbide mononitrate (IMDUR) 30 MG 24 hr tablet   Oral   Take 30 mg by mouth daily.           Marland Kitchen lisinopril (PRINIVIL,ZESTRIL) 20 MG tablet   Oral   Take 20 mg by mouth daily.           . Multiple Vitamin (MULTIVITAMIN WITH MINERALS) TABS   Oral   Take 1 tablet by mouth daily.         . trospium (SANCTURA) 20 MG tablet   Oral   Take 20 mg by mouth 2 (two) times daily.         . cephALEXin (KEFLEX) 750 MG capsule   Oral   Take 1 capsule (750 mg total) by mouth 2 (two) times daily.   20  capsule   0   . nitroGLYCERIN (NITROSTAT) 0.4 MG SL tablet   Sublingual   Place 0.4 mg under the tongue every 5 (five) minutes as needed for chest pain.            BP 117/50  Pulse 67  Temp(Src) 99.1 F (37.3 C) (Oral)  Resp 16  SpO2 96%  Physical Exam  Constitutional: He is oriented to person, place, and time. He appears well-developed and well-nourished. No distress.  HENT:  Head: Normocephalic and atraumatic.  Mouth/Throat: Oropharynx is clear and moist. Mucous membranes are dry. Abnormal dentition (missing front teeth). No oropharyngeal exudate.  Eyes: Conjunctivae and EOM are normal. Pupils are equal, round, and reactive to light.  Neck: Normal range of motion.  Cardiovascular: Normal rate and intact distal pulses.  An irregular rhythm present. Exam reveals no gallop.   No murmur heard. Pulmonary/Chest: Effort normal and breath sounds normal. He has no wheezes. He has no rales.  Abdominal: Soft. Normal appearance. There is tenderness in the suprapubic area. There is no rebound.  Musculoskeletal: He exhibits no edema and no tenderness.  Neurological: He is alert and oriented to person, place, and time. No cranial nerve deficit. Coordination normal.  Able to sit up without assistance.  Skin: Skin is warm. No rash noted. He is diaphoretic.    ED Course  Procedures (including critical care time)  Labs Reviewed  URINALYSIS, ROUTINE W REFLEX MICROSCOPIC - Abnormal; Notable for the following:    Color, Urine GREEN (*)    APPearance CLOUDY (*)    Hgb urine dipstick LARGE (*)    Protein, ur 30 (*)    Leukocytes, UA MODERATE (*)    All other components within normal limits  CBC WITH DIFFERENTIAL - Abnormal; Notable for the following:    WBC 12.4 (*)    Platelets 97 (*)    Neutrophils Relative 80 (*)    Neutro Abs 9.9 (*)    Lymphocytes Relative 4 (*)    Lymphs Abs 0.6 (*)    Monocytes Relative 16 (*)    Monocytes Absolute 2.0 (*)    All other components within normal  limits  COMPREHENSIVE METABOLIC PANEL -  Abnormal; Notable for the following:    Glucose, Bld 135 (*)    BUN 33 (*)    Creatinine, Ser 1.41 (*)    AST 38 (*)    GFR calc non Af Amer 45 (*)    GFR calc Af Amer 52 (*)    All other components within normal limits  URINE MICROSCOPIC-ADD ON - Abnormal; Notable for the following:    Bacteria, UA FEW (*)    All other components within normal limits  CULTURE, BLOOD (ROUTINE X 2)  CULTURE, BLOOD (ROUTINE X 2)  URINE CULTURE  CG4 I-STAT (LACTIC ACID)  POCT I-STAT TROPONIN I   Dg Chest Portable 1 View  05/19/2012  *RADIOLOGY REPORT*  Clinical Data: Weakness and loss of balance.  PORTABLE CHEST - 1 VIEW  Comparison: 06/16/2011  Findings: Stable appearance of mediastinal postoperative changes. Shallow inspiration.  Increased heart size with mild pulmonary vascular congestion.  No edema.  Atelectasis or infiltration in both lung bases.  No pneumothorax.  Increased density in the right lung apex likely represents prominent vascular shadows.  Pleural thickening is not excluded.  Degenerative changes in the shoulders.  IMPRESSION: Cardiac enlargement with mild pulmonary vascular congestion. Linear atelectasis or infiltration in the lung bases.  Shallow inspiration.  Probable vascular shadow opacifying the right apex.   Original Report Authenticated By: Lucienne Capers, M.D.      Date: 05/19/2012  Rate: 72  Rhythm: sinus rhythm with some abnormalities, possibly supraventricular bigeminy   QRS Axis: normal  Intervals: PR 216, QT 428  ST/T Wave abnormalities: normal  Conduction Disutrbances: nonspecific intraventricular conduction delay  Old EKG Reviewed: No significant changes noted in wave forms, although rhythm is different   1. Urinary tract infection   2. Weakness      MDM  79 yo M with progressively worsening weakness, dysuria, abdominal pain  Afebrile at triage, but patient feels warm and diaphoretic. BP 90's/60's. Given exam and PMH, will  check CBC, Cmet, blood cultures, UA, lactic acid and EKG. Pt given 1L saline bolus.  0417- Pt states he is feeling better at this time with fluids. His BP has improved. On review of his labs, his WBC is elevated,as well as his creat. His urine shows WBC and few bacteria. Could be from UTI vs. Prostatitis. Will give one dose of Rocephin here. Encourage PO intake and ambulate patient. If he does well, will d/c home with close follow up with urology.  Patient ambulated well. Will d/c home with Keflex and he should follow up with urology as soon as possible. Return for worsening symptoms. Pt and wife agree with this plan.   Montez Morita, MD 05/19/12 [phone]

## 2012-05-19 NOTE — ED Provider Notes (Signed)
I have personally seen and examined the patient.  I have discussed the plan of care with the resident.  I have reviewed the documentation on PMH/FH/Soc. History.  I have reviewed the documentation of the resident and agree.  I have reviewed and agree with the ECG interpretation(s) documented by the resident.  Pt well appearing, vitals improved.  He had no focal motor weakness.  He appears appropriate for outpatient management    Sharyon Cable, MD 05/19/12 [phone]

## 2012-05-20 ENCOUNTER — Telehealth (HOSPITAL_COMMUNITY): Payer: Self-pay | Admitting: *Deleted

## 2012-05-20 ENCOUNTER — Encounter (HOSPITAL_COMMUNITY): Payer: Self-pay | Admitting: *Deleted

## 2012-05-20 ENCOUNTER — Inpatient Hospital Stay (HOSPITAL_COMMUNITY)
Admission: EM | Admit: 2012-05-20 | Discharge: 2012-05-22 | DRG: 872 | Disposition: A | Discharge status: Home Health | Payer: Medicare Other | Attending: Internal Medicine | Admitting: Internal Medicine

## 2012-05-20 DIAGNOSIS — I2510 Atherosclerotic heart disease of native coronary artery without angina pectoris: Secondary | ICD-10-CM | POA: Diagnosis present

## 2012-05-20 DIAGNOSIS — Z8546 Personal history of malignant neoplasm of prostate: Secondary | ICD-10-CM | POA: Diagnosis not present

## 2012-05-20 DIAGNOSIS — I10 Essential (primary) hypertension: Secondary | ICD-10-CM | POA: Diagnosis not present

## 2012-05-20 DIAGNOSIS — Z79899 Other long term (current) drug therapy: Secondary | ICD-10-CM

## 2012-05-20 DIAGNOSIS — Z951 Presence of aortocoronary bypass graft: Secondary | ICD-10-CM

## 2012-05-20 DIAGNOSIS — E7800 Pure hypercholesterolemia, unspecified: Secondary | ICD-10-CM | POA: Diagnosis present

## 2012-05-20 DIAGNOSIS — E785 Hyperlipidemia, unspecified: Secondary | ICD-10-CM | POA: Diagnosis present

## 2012-05-20 DIAGNOSIS — N390 Urinary tract infection, site not specified: Secondary | ICD-10-CM | POA: Diagnosis not present

## 2012-05-20 DIAGNOSIS — N200 Calculus of kidney: Secondary | ICD-10-CM

## 2012-05-20 DIAGNOSIS — Z9181 History of falling: Secondary | ICD-10-CM | POA: Diagnosis not present

## 2012-05-20 DIAGNOSIS — Z7982 Long term (current) use of aspirin: Secondary | ICD-10-CM

## 2012-05-20 DIAGNOSIS — R0989 Other specified symptoms and signs involving the circulatory and respiratory systems: Secondary | ICD-10-CM | POA: Diagnosis not present

## 2012-05-20 DIAGNOSIS — R7881 Bacteremia: Secondary | ICD-10-CM | POA: Diagnosis not present

## 2012-05-20 DIAGNOSIS — I252 Old myocardial infarction: Secondary | ICD-10-CM | POA: Diagnosis not present

## 2012-05-20 DIAGNOSIS — Z87442 Personal history of urinary calculi: Secondary | ICD-10-CM

## 2012-05-20 DIAGNOSIS — A498 Other bacterial infections of unspecified site: Secondary | ICD-10-CM | POA: Diagnosis present

## 2012-05-20 LAB — CG4 I-STAT (LACTIC ACID): Lactic Acid, Venous: 1.06 mmol/L (ref 0.5–2.2)

## 2012-05-20 LAB — BASIC METABOLIC PANEL
BUN: 37 mg/dL — ABNORMAL HIGH (ref 6–23)
CO2: 25 mEq/L (ref 19–32)
Calcium: 9.5 mg/dL (ref 8.4–10.5)
Chloride: 101 mEq/L (ref 96–112)
Creatinine, Ser: 1.35 mg/dL (ref 0.50–1.35)
GFR calc Af Amer: 55 mL/min — ABNORMAL LOW (ref >90)
GFR calc non Af Amer: 47 mL/min — ABNORMAL LOW (ref >90)
Glucose, Bld: 115 mg/dL — ABNORMAL HIGH (ref 70–99)
Potassium: 4.1 mEq/L (ref 3.5–5.1)
Sodium: 137 mEq/L (ref 135–145)

## 2012-05-20 LAB — URINALYSIS, ROUTINE W REFLEX MICROSCOPIC
Bilirubin Urine: NEGATIVE
Glucose, UA: NEGATIVE mg/dL
Ketones, ur: NEGATIVE mg/dL
Nitrite: NEGATIVE
Protein, ur: NEGATIVE mg/dL
Specific Gravity, Urine: 1.023 (ref 1.005–1.030)
Urobilinogen, UA: 1 mg/dL (ref 0.0–1.0)
pH: 5.5 (ref 5.0–8.0)

## 2012-05-20 LAB — GLUCOSE, CAPILLARY: Glucose-Capillary: 158 mg/dL — ABNORMAL HIGH (ref 70–99)

## 2012-05-20 LAB — CBC WITH DIFFERENTIAL/PLATELET
Basophils Absolute: 0 10*3/uL (ref 0.0–0.1)
Basophils Relative: 0 % (ref 0–1)
Eosinophils Absolute: 0.1 10*3/uL (ref 0.0–0.7)
Eosinophils Relative: 1 % (ref 0–5)
HCT: 38.3 % — ABNORMAL LOW (ref 39.0–52.0)
Hemoglobin: 13.1 g/dL (ref 13.0–17.0)
Lymphocytes Relative: 11 % — ABNORMAL LOW (ref 12–46)
Lymphs Abs: 0.8 10*3/uL (ref 0.7–4.0)
MCH: 30.7 pg (ref 26.0–34.0)
MCHC: 34.2 g/dL (ref 30.0–36.0)
MCV: 89.7 fL (ref 78.0–100.0)
Monocytes Absolute: 1.6 10*3/uL — ABNORMAL HIGH (ref 0.1–1.0)
Monocytes Relative: 21 % — ABNORMAL HIGH (ref 3–12)
Neutro Abs: 5.1 10*3/uL (ref 1.7–7.7)
Neutrophils Relative %: 68 % (ref 43–77)
Platelets: 107 10*3/uL — ABNORMAL LOW (ref 150–400)
RBC: 4.27 MIL/uL (ref 4.22–5.81)
RDW: 13.6 % (ref 11.5–15.5)
WBC: 7.6 10*3/uL (ref 4.0–10.5)

## 2012-05-20 LAB — URINE MICROSCOPIC-ADD ON

## 2012-05-20 MED ORDER — ISOSORBIDE MONONITRATE ER 30 MG PO TB24
30.0000 mg | ORAL_TABLET | Freq: Every day | ORAL | Status: DC
  Administered 2012-05-21 – 2012-05-22 (×2): 30 mg via ORAL
  Filled 2012-05-20 (×2): qty 1

## 2012-05-20 MED ORDER — ONDANSETRON HCL 4 MG PO TABS: 4.0000 mg | ORAL_TABLET | Freq: Four times a day (QID) | ORAL | Status: DC | PRN

## 2012-05-20 MED ORDER — POLYVINYL ALCOHOL 1.4 % OP SOLN
1.0000 [drp] | Freq: Every day | OPHTHALMIC | Status: DC
  Administered 2012-05-21 – 2012-05-22 (×2): 1 [drp] via OPHTHALMIC
  Filled 2012-05-20: qty 15

## 2012-05-20 MED ORDER — GALANTAMINE HYDROBROMIDE 4 MG PO TABS
8.0000 mg | ORAL_TABLET | Freq: Every day | ORAL | Status: DC
  Administered 2012-05-21 – 2012-05-22 (×2): 8 mg via ORAL
  Filled 2012-05-20 (×2): qty 2

## 2012-05-20 MED ORDER — LISINOPRIL 20 MG PO TABS
20.0000 mg | ORAL_TABLET | Freq: Every day | ORAL | Status: DC
  Administered 2012-05-21 – 2012-05-22 (×2): 20 mg via ORAL
  Filled 2012-05-20 (×2): qty 1

## 2012-05-20 MED ORDER — ATORVASTATIN CALCIUM 80 MG PO TABS
80.0000 mg | ORAL_TABLET | Freq: Every day | ORAL | Status: DC
  Administered 2012-05-20 – 2012-05-21 (×2): 80 mg via ORAL
  Filled 2012-05-20 (×3): qty 1

## 2012-05-20 MED ORDER — ASPIRIN EC 325 MG PO TBEC
325.0000 mg | DELAYED_RELEASE_TABLET | Freq: Every day | ORAL | Status: DC
  Administered 2012-05-21 – 2012-05-22 (×2): 325 mg via ORAL
  Filled 2012-05-20 (×2): qty 1

## 2012-05-20 MED ORDER — ASPIRIN EC 325 MG PO TBEC
325.0000 mg | DELAYED_RELEASE_TABLET | Freq: Every day | ORAL | Status: DC
  Filled 2012-05-20: qty 1

## 2012-05-20 MED ORDER — ACETAMINOPHEN 325 MG PO TABS: 650.0000 mg | ORAL_TABLET | Freq: Four times a day (QID) | ORAL | Status: DC | PRN

## 2012-05-20 MED ORDER — HYPROMELLOSE (GONIOSCOPIC) 2.5 % OP SOLN: 1.0000 [drp] | Freq: Every day | OPHTHALMIC | Status: DC

## 2012-05-20 MED ORDER — ENOXAPARIN SODIUM 40 MG/0.4ML ~~LOC~~ SOLN
40.0000 mg | SUBCUTANEOUS | Status: DC
  Administered 2012-05-20 – 2012-05-21 (×2): 40 mg via SUBCUTANEOUS
  Filled 2012-05-20 (×3): qty 0.4

## 2012-05-20 MED ORDER — NITROGLYCERIN 0.4 MG SL SUBL: 0.4000 mg | SUBLINGUAL_TABLET | SUBLINGUAL | Status: DC | PRN

## 2012-05-20 MED ORDER — ONDANSETRON HCL 4 MG/2ML IJ SOLN: 4.0000 mg | Freq: Four times a day (QID) | INTRAMUSCULAR | Status: DC | PRN

## 2012-05-20 MED ORDER — ACETAMINOPHEN 650 MG RE SUPP: 650.0000 mg | Freq: Four times a day (QID) | RECTAL | Status: DC | PRN

## 2012-05-20 MED ORDER — DEXTROSE 5 % IV SOLN
1.0000 g | INTRAVENOUS | Status: DC
  Administered 2012-05-20: 1 g via INTRAVENOUS
  Filled 2012-05-20 (×2): qty 10

## 2012-05-20 MED ORDER — DARIFENACIN HYDROBROMIDE ER 7.5 MG PO TB24
7.5000 mg | ORAL_TABLET | Freq: Every day | ORAL | Status: DC
  Administered 2012-05-21 – 2012-05-22 (×2): 7.5 mg via ORAL
  Filled 2012-05-20 (×2): qty 1

## 2012-05-20 MED ORDER — SODIUM CHLORIDE 0.9 % IV SOLN
INTRAVENOUS | Status: AC
  Administered 2012-05-20: 20:00:00 via INTRAVENOUS

## 2012-05-20 MED ORDER — SODIUM CHLORIDE 0.9 % IV SOLN
INTRAVENOUS | Status: DC
  Administered 2012-05-20 (×3): via INTRAVENOUS

## 2012-05-20 MED ORDER — ADULT MULTIVITAMIN W/MINERALS CH
1.0000 | ORAL_TABLET | Freq: Every day | ORAL | Status: DC
  Administered 2012-05-21 – 2012-05-22 (×2): 1 via ORAL
  Filled 2012-05-20 (×2): qty 1

## 2012-05-20 NOTE — ED Notes (Signed)
Pt informed of need for urine sample; placed urinal at bedside for pt when ready to use

## 2012-05-20 NOTE — H&P (Signed)
[facility] AD[facility] NOTE History and Physical  MAYJOR MIYAHIRA [id number] DOB: [birth year] DOA: 05/20/2012   PCP: Nyoka Cowden, MD   Chief Complaint: Abnormal labs  HPI: 79 year old man with past medical history significant for CAD s/p CABG, prostate cancer s/p seed implant in 1998, recurrent nephrolithiasis  and frequent UTI was told to come to the ER for abnormal lab(positive blood cultures).    Patient was seen in the ED on Feb 22 nd for weakness and chills for one day . As a part of work up, he was found to have a positive blood on his labs. He was given IV antibiotics and was discharged home with a prescription for Keflex. Patient states since his discharge from the emergency room he has started to feel better. He is not urinating as frequently. He has not had any fever. He has not had any vomiting or diarrhea. He has taken  doses of his Keflex as directed.  His laboratory tests showed blood culture that was positive for Escherichia coli. ER physician wants to admit the patient for observation and IV antibiotics.   Review of Systems:  Review of Systems  Constitutional: Negative for fever and chills.  HENT: Negative for congestion.  Respiratory: Negative for shortness of breath.  Cardiovascular: Negative for chest pain.  Gastrointestinal: Positive for abdominal pain (suprapubic). Negative for nausea and vomiting.  Genitourinary: Positive for dysuria and difficulty urinating. Negative for flank pain.  Musculoskeletal: Negative for back pain.  Skin: Negative for rash.  Neurological: Positive for weakness.  All other systems reviewed and are negative   Past Medical History  Diagnosis Date  . Coronary artery disease     11/89 CABG  (70% circ; 90% PD; 60-70% distal left main; 30% left circ; 90% 1st diag;, 2nd diag and 3rd diag. w/90% mild LAD stenosis and 60-70% prox. stenosisin the LAD)  . Kidney stone     1970  . S/P T&A (status post tonsillectomy and  adenoidectomy)     1954  . Cancer     prostate cancer and seed implant 1998  . Hyperlipidemia   . Dementia     mild  . Normal nuclear stress test July 2011  . Myocardial infarction     Past Surgical History  Procedure Laterality Date  . Coronary artery bypass graft      11/89 CABG  (70% circ; 90% PD; 60-70% distal left main; 30% left circ; 90% 1st diag;, 2nd diag and 3rd diag. w/90% mild LAD stenosis and 60-70% prox. stenosisin the LAD. Redo bypass in 1997  . Tonsillectomy    . Cholecystectomy    . Eus  10/05/2011    Procedure: ESOPHAGEAL ENDOSCOPIC ULTRASOUND (EUS) RADIAL;  Surgeon: Arta Silence, MD;  Location: [facility];  Service: Endoscopy;  Laterality: N/A;    Social History:  reports that he has quit smoking. He does not have any smokeless tobacco history on file. He reports that he does not drink alcohol or use illicit drugs.  Allergies  Allergen Reactions  . Ciprofloxacin Swelling    Allergy classified as swelling here, but pt says Cipro just doesn't work for him    . Nitrofurantoin Hives    Hives that lasted for weeks- he had to go to the hospital   . Sulfonamide Derivatives Hives         Family History  Problem Relation Age of Onset  . Heart disease Mother      Prior to Admission medications   Medication Sig  Start Date End Date Taking? Authorizing Provider  acetaminophen (TYLENOL) 500 MG tablet Take 500 mg by mouth every 6 (six) hours as needed for pain or fever. Take 1 -2 pills   Yes Historical Provider, MD  aspirin EC 325 MG tablet Take 325 mg by mouth daily.   Yes Historical Provider, MD  atorvastatin (LIPITOR) 80 MG tablet Take 80 mg by mouth daily.   Yes Historical Provider, MD  Calcium Carbonate-Vitamin D (CALCIUM + D PO) Take 1 tablet by mouth 2 (two) times daily.    Yes Historical Provider, MD  cephALEXin (KEFLEX) 750 MG capsule Take 1 capsule (750 mg total) by mouth 2 (two) times daily. 05/19/12  Yes Amber Fidel Levy, MD  galantamine (RAZADYNE) 8 MG  tablet Take 8 mg by mouth daily.    Yes Historical Provider, MD  hydroxypropyl methylcellulose (ISOPTO TEARS) 2.5 % ophthalmic solution Place 1 drop into both eyes daily.   Yes Historical Provider, MD  isosorbide mononitrate (IMDUR) 30 MG 24 hr tablet Take 30 mg by mouth daily.     Yes Historical Provider, MD  lisinopril (PRINIVIL,ZESTRIL) 20 MG tablet Take 20 mg by mouth daily.     Yes Historical Provider, MD  Multiple Vitamin (MULTIVITAMIN WITH MINERALS) TABS Take 1 tablet by mouth daily.   Yes Historical Provider, MD  trospium (SANCTURA) 20 MG tablet Take 20 mg by mouth 2 (two) times daily.   Yes Historical Provider, MD  nitroGLYCERIN (NITROSTAT) 0.4 MG SL tablet Place 0.4 mg under the tongue every 5 (five) minutes as needed for chest pain.     Historical Provider, MD   Physical Exam: Filed Vitals:   05/20/12 1427 05/20/12 1821  BP: 96/43 136/82  Pulse: 72 68  Temp: 97.3 F (36.3 C)   TempSrc: Oral   Resp: 16 16  SpO2: 99% 100%    Constitutional: He is oriented to person, place, and time. He appears well-developed and well-nourished. No distress.  HENT:  Head: Normocephalic and atraumatic.  Mouth/Throat: Mucous membranes are moist . Abnormal dentition (missing front teeth). No oropharyngeal exudate.  Eyes: Conjunctivae and EOM are normal. Pupils are equal, round, and reactive to light.  Neck: Normal range of motion.  Cardiovascular: Normal rate ,  irregular rhythm , no murmur , rubs or gallop.  Pulmonary/Chest: Effort normal and breath sounds normal. He has no wheezes. He has no rales.  Abdominal: Soft, mild suprapubic tenderness, positive bowel sounds, no oragnomegaly Musculoskeletal: He exhibits no edema and no tenderness.  Neurological: He is alert and oriented to person, place, and time. No cranial nerve deficit. Coordination normal.  Able to sit up without assistance.  Skin: Skin is warm. No rash noted.    Wt Readings from Last 3 Encounters:  03/15/12 165 lb (74.844 kg)   02/07/12 164 lb (74.39 kg)  10/14/11 162 lb (73.483 kg)    Labs on Admission:  Basic Metabolic Panel:  Recent Labs Lab 05/18/12 2306 05/20/12 1504  NA 136 137  K 4.7 4.1  CL 99 101  CO2 25 25  GLUCOSE 135* 115*  BUN 33* 37*  CREATININE 1.41* 1.35  CALCIUM 9.3 9.5    Liver Function Tests:  Recent Labs Lab 05/18/12 2306  AST 38*  ALT 26  ALKPHOS 79  BILITOT 1.1  PROT 6.9  ALBUMIN 3.5    CBC:  Recent Labs Lab 05/18/12 2306 05/20/12 1504  WBC 12.4* 7.6  NEUTROABS 9.9* 5.1  HGB 14.2 13.1  HCT 40.9 38.3*  MCV 89.3  89.7  PLT 97* 107*     Troponin (Point of Care Test)  Recent Labs  05/19/12 0255  TROPIPOC 0.05     Radiological Exams on Admission: Dg Chest Portable 1 View  05/19/2012  *RADIOLOGY REPORT*  Clinical Data: Weakness and loss of balance.  PORTABLE CHEST - 1 VIEW  Comparison: 06/16/2011  Findings: Stable appearance of mediastinal postoperative changes. Shallow inspiration.  Increased heart size with mild pulmonary vascular congestion.  No edema.  Atelectasis or infiltration in both lung bases.  No pneumothorax.  Increased density in the right lung apex likely represents prominent vascular shadows.  Pleural thickening is not excluded.  Degenerative changes in the shoulders.  IMPRESSION: Cardiac enlargement with mild pulmonary vascular congestion. Linear atelectasis or infiltration in the lung bases.  Shallow inspiration.  Probable vascular shadow opacifying the right apex.   Original Report Authenticated By: Lucienne Capers, M.D.     EKG: Not done today   Principal Problem:   Bacteremia due to Escherichia coli Active Problems:   HYPERCHOLESTEROLEMIA   CORONARY ARTERY DISEASE   HYPERTENSION NEC   PROSTATE CANCER, HX OF   Urinary tract infection   Assessment/Plan 1. E. Coli Bacteremia ; Likely 2/2 UTI but prostatitis can not be excluded.  Patient presented with symptoms of urinary frequnecy, dysuria and weakness for 1 day to the ER on Feb  22nd. He was given one dose of ceftriaxone and d/c home on keflex. With is 1/2 blood cultures growing E. Coli , he was advised to come to the hospital for admission. He is noted to have soft BP but afebrile. His WBC was mildly elecvated to 12k but has resolved with repeat labs today.  -Admit to med- surg bed. -Hydration with IV fluids - Start IV ceftriaxone 1 g every 24 hours -Continue home medications -Tylenol for pain control -Admit as an observation and discharge the patient home tomorrow if he continues to improve and do well.  Code Status: Full Family Communication: Patient and family at the bedside were updated on the plan of care.  Disposition Plan/Anticipated LOS: Likely in 2-3 days  Time spent: 70  minutes  Janell Quiet, MD  [facility] Team [facility]  If 7PM-7AM, please contact night-coverage at [email or web address], password [facility] 05/20/2012, 6:47 PM

## 2012-05-20 NOTE — ED Provider Notes (Signed)
History    CSN: [id number] Arrival date & time 05/20/12  1418 First MD Initiated Contact with Patient 05/20/12 1448    Chief Complaint  Patient presents with  . Abnormal Lab   HPI The patient was told to come to the emergency room because he had a positive blood culture. Patient had been having issues with weakness and fever or today to go. He was seen in the emergency room on February 22. At that time, he had laboratory testing and was found to have a urinary tract infection. The patient was given IV antibiotics and was discharged home with a prescription for Keflex. Patient states since his discharge from the emergency room his x-ray started to feel better. He is not urinating as frequently. He has not had any fever. He has not had any vomiting or diarrhea. He has taken several doses of his Keflex since he was in the emergency department.  I have reviewed his laboratory tests and he had a blood culture that was positive for Escherichia coli. Past Medical History  Diagnosis Date  . Coronary artery disease     11/89 CABG  (70% circ; 90% PD; 60-70% distal left main; 30% left circ; 90% 1st diag;, 2nd diag and 3rd diag. w/90% mild LAD stenosis and 60-70% prox. stenosisin the LAD)  . Kidney stone     1970  . S/P T&A (status post tonsillectomy and adenoidectomy)     1954  . Cancer     prostate cancer and seed implant 1998  . Hyperlipidemia   . Dementia     mild  . Normal nuclear stress test July 2011  . Myocardial infarction     Past Surgical History  Procedure Laterality Date  . Coronary artery bypass graft      11/89 CABG  (70% circ; 90% PD; 60-70% distal left main; 30% left circ; 90% 1st diag;, 2nd diag and 3rd diag. w/90% mild LAD stenosis and 60-70% prox. stenosisin the LAD. Redo bypass in 1997  . Tonsillectomy    . Cholecystectomy    . Eus  10/05/2011    Procedure: ESOPHAGEAL ENDOSCOPIC ULTRASOUND (EUS) RADIAL;  Surgeon: Arta Silence, MD;  Location: [facility];  Service:  Endoscopy;  Laterality: N/A;    Family History  Problem Relation Age of Onset  . Heart disease Mother     History  Substance Use Topics  . Smoking status: Former Research scientist (life sciences)  . Smokeless tobacco: Not on file  . Alcohol Use: No      Review of Systems  All other systems reviewed and are negative.    Allergies  Ciprofloxacin; Nitrofurantoin; and Sulfonamide derivatives  Home Medications   Current Outpatient Rx  Name  Route  Sig  Dispense  Refill  . acetaminophen (TYLENOL) 500 MG tablet   Oral   Take 500 mg by mouth every 6 (six) hours as needed for pain or fever. Take 1 -2 pills         . aspirin EC 325 MG tablet   Oral   Take 325 mg by mouth daily.         Marland Kitchen atorvastatin (LIPITOR) 80 MG tablet   Oral   Take 80 mg by mouth daily.         . Calcium Carbonate-Vitamin D (CALCIUM + D PO)   Oral   Take 1 tablet by mouth 2 (two) times daily.          . cephALEXin (KEFLEX) 750 MG capsule   Oral  Take 1 capsule (750 mg total) by mouth 2 (two) times daily.   20 capsule   0   . galantamine (RAZADYNE) 8 MG tablet   Oral   Take 8 mg by mouth daily.          . hydroxypropyl methylcellulose (ISOPTO TEARS) 2.5 % ophthalmic solution   Both Eyes   Place 1 drop into both eyes daily.         . isosorbide mononitrate (IMDUR) 30 MG 24 hr tablet   Oral   Take 30 mg by mouth daily.           Marland Kitchen lisinopril (PRINIVIL,ZESTRIL) 20 MG tablet   Oral   Take 20 mg by mouth daily.           . Multiple Vitamin (MULTIVITAMIN WITH MINERALS) TABS   Oral   Take 1 tablet by mouth daily.         . nitroGLYCERIN (NITROSTAT) 0.4 MG SL tablet   Sublingual   Place 0.4 mg under the tongue every 5 (five) minutes as needed for chest pain.          . trospium (SANCTURA) 20 MG tablet   Oral   Take 20 mg by mouth 2 (two) times daily.           BP 96/43  Pulse 72  Temp(Src) 97.3 F (36.3 C) (Oral)  Resp 16  SpO2 99%  Physical Exam  Nursing note and vitals  reviewed. Constitutional: He appears well-developed and well-nourished. No distress.  Elderly  HENT:  Head: Normocephalic and atraumatic.  Right Ear: External ear normal.  Left Ear: External ear normal.  Eyes: Conjunctivae are normal. Right eye exhibits no discharge. Left eye exhibits no discharge. No scleral icterus.  Neck: Neck supple. No tracheal deviation present.  Cardiovascular: Normal rate, regular rhythm and intact distal pulses.   Pulmonary/Chest: Effort normal and breath sounds normal. No stridor. No respiratory distress. He has no wheezes. He has no rales.  Abdominal: Soft. Bowel sounds are normal. He exhibits no distension. There is no tenderness. There is no rebound and no guarding.  Musculoskeletal: He exhibits no edema and no tenderness.  Neurological: He is alert. He has normal strength. No sensory deficit. Cranial nerve deficit:  no gross defecits noted. He exhibits normal muscle tone. He displays no seizure activity. Coordination normal.  Skin: Skin is warm and dry. No rash noted.  Psychiatric: He has a normal mood and affect.    ED Course  Procedures (including critical care time) Medications  0.9 %  sodium chloride infusion ( Intravenous New Bag/Given 05/20/12 1519)  cefTRIAXone (ROCEPHIN) 1 g in dextrose 5 % 50 mL IVPB (0 g Intravenous Stopped 05/20/12 1605)    Labs Reviewed  CBC WITH DIFFERENTIAL - Abnormal; Notable for the following:    HCT 38.3 (*)    Platelets 107 (*)    Lymphocytes Relative 11 (*)    Monocytes Relative 21 (*)    Monocytes Absolute 1.6 (*)    All other components within normal limits  BASIC METABOLIC PANEL - Abnormal; Notable for the following:    Glucose, Bld 115 (*)    BUN 37 (*)    GFR calc non Af Amer 47 (*)    GFR calc Af Amer 55 (*)    All other components within normal limits  URINALYSIS, ROUTINE W REFLEX MICROSCOPIC - Abnormal; Notable for the following:    Color, Urine GREEN (*)    APPearance TURBID (*)  Hgb urine dipstick  SMALL (*)    Leukocytes, UA MODERATE (*)    All other components within normal limits  URINE CULTURE  CULTURE, BLOOD (ROUTINE X 2)  CULTURE, BLOOD (ROUTINE X 2)  URINE MICROSCOPIC-ADD ON   Dg Chest Portable 1 View  05/19/2012  *RADIOLOGY REPORT*  Clinical Data: Weakness and loss of balance.  PORTABLE CHEST - 1 VIEW  Comparison: 06/16/2011  Findings: Stable appearance of mediastinal postoperative changes. Shallow inspiration.  Increased heart size with mild pulmonary vascular congestion.  No edema.  Atelectasis or infiltration in both lung bases.  No pneumothorax.  Increased density in the right lung apex likely represents prominent vascular shadows.  Pleural thickening is not excluded.  Degenerative changes in the shoulders.  IMPRESSION: Cardiac enlargement with mild pulmonary vascular congestion. Linear atelectasis or infiltration in the lung bases.  Shallow inspiration.  Probable vascular shadow opacifying the right apex.   Original Report Authenticated By: Lucienne Capers, M.D.      1. Urinary tract infection   2. Bacteremia   3. Escherichia coli infection       MDM  The patient is feeling better since he  started antibiotics. His blood culture yesterday was positive for Escherichia coli as was his urine culture. He currently appears well and doesn't appear septic. However considering the positive blood cultures and his age I will consult the hospitalist service regarding admission for IV antibiotics and observation        Kathalene Frames, MD 05/20/12 1755

## 2012-05-20 NOTE — ED Notes (Signed)
Reports being called by dr office and told to come here due to +blood culture.

## 2012-05-20 NOTE — ED Notes (Signed)
Pts wife at bedside states that pts control of urination has progressively been getting worse over the past 2-3 weeks and with the loss of control has come pain upon urination; pt denies n/v/d; pt denies blurred vision and headache; pt alert and mentating appropriately; pt states occasional dizziness upon standing.

## 2012-05-21 DIAGNOSIS — A498 Other bacterial infections of unspecified site: Secondary | ICD-10-CM

## 2012-05-21 DIAGNOSIS — R7881 Bacteremia: Principal | ICD-10-CM

## 2012-05-21 DIAGNOSIS — Z8546 Personal history of malignant neoplasm of prostate: Secondary | ICD-10-CM

## 2012-05-21 DIAGNOSIS — Z87442 Personal history of urinary calculi: Secondary | ICD-10-CM

## 2012-05-21 LAB — CBC
HCT: 36.5 % — ABNORMAL LOW (ref 39.0–52.0)
Hemoglobin: 12.4 g/dL — ABNORMAL LOW (ref 13.0–17.0)
MCH: 30.1 pg (ref 26.0–34.0)
MCHC: 34 g/dL (ref 30.0–36.0)
MCV: 88.6 fL (ref 78.0–100.0)
Platelets: 101 10*3/uL — ABNORMAL LOW (ref 150–400)
RBC: 4.12 MIL/uL — ABNORMAL LOW (ref 4.22–5.81)
RDW: 13.6 % (ref 11.5–15.5)
WBC: 5.7 10*3/uL (ref 4.0–10.5)

## 2012-05-21 LAB — BASIC METABOLIC PANEL
BUN: 24 mg/dL — ABNORMAL HIGH (ref 6–23)
CO2: 24 mEq/L (ref 19–32)
Calcium: 8.5 mg/dL (ref 8.4–10.5)
Chloride: 106 mEq/L (ref 96–112)
Creatinine, Ser: 0.99 mg/dL (ref 0.50–1.35)
GFR calc Af Amer: 86 mL/min — ABNORMAL LOW (ref >90)
GFR calc non Af Amer: 74 mL/min — ABNORMAL LOW (ref >90)
Glucose, Bld: 107 mg/dL — ABNORMAL HIGH (ref 70–99)
Potassium: 3.7 mEq/L (ref 3.5–5.1)
Sodium: 138 mEq/L (ref 135–145)

## 2012-05-21 LAB — URINE CULTURE: Colony Count: >=100000

## 2012-05-21 MED ORDER — DEXTROSE 5 % IV SOLN
2.0000 g | INTRAVENOUS | Status: DC
  Administered 2012-05-21: 2 g via INTRAVENOUS
  Filled 2012-05-21 (×3): qty 2

## 2012-05-21 NOTE — Progress Notes (Signed)
[facility] PROGRESS NOTE  Ronald [name] [id number] DOB: 09-Dec-1927 DOA: 05/20/2012 PCP: Nyoka Cowden, MD  79 year old man with past medical history significant for CAD s/p CABG, prostate cancer s/p seed implant in 1998, recurrent nephrolithiasis and frequent UTI was told to come to the ER for abnormal lab(positive blood cultures.  Patient was seen in the ED on Feb 22 nd for weakness and chills for one day . As a part of work up, he was found to have a positive blood on his labs. He was given IV antibiotics and was discharged home with a prescription for Keflex. Patient states since his discharge from the emergency room he has started to feel better. He is not urinating as frequently. He has not had any fever. He has not had any vomiting or diarrhea. He has taken doses of his Keflex as directed. His laboratory tests showed blood culture that was positive for Escherichia coli. ER physician wants to admit the patient for observation and IV antibiotics.  Assessment/Plan: Escherichia coli bacteremia One of 2 blood cultures positive for Escherichia coli Currently being treated with IV Rocephin daily Followup blood cultures have been drawn. Awaiting sensitivities on blood cultures drawn February 22.  Escherichia coli urinary tract infection Urine cultures show Escherichia coli resistant to ampicillin Patient being treated with IV Rocephin. Appears well. Asymptomatic.  Hypertension On prior to admission medications including lisinopril,Imdur and Sanctura. Blood pressure stable  History of prostate cancer. Not currently symptomatic   Falling, loss of balance Multiple falls over the past year Orthostatic vital signs checked. The patient is not orthostatic Physical therapy evaluation ordered Likely will recommend him health physical therapy   Code Status: Full  Family Communication: Daughter at bedside  Disposition Plan: Home likely February 25, once final culture  sensitivities have resulted.   Consultants:  None  Procedures:  None  Antibiotics:  Rocephin.  HPI/Subjective: No complaints. Anxious about blood culture results.   Objective: Filed Vitals:   05/20/12 2035 05/20/12 2213 05/21/12 0558 05/21/12 0934  BP: 122/66 125/56 133/70 132/69  Pulse: 71 63 61   Temp: 97.9 F (36.6 C) 98.3 F (36.8 C) 98 F (36.7 C)   TempSrc: Oral Oral Oral   Resp: 18 20 16    Height: 5\' 5"  (1.651 m)     Weight: 75.8 kg (167 lb 1.7 oz)     SpO2: 93% 94% 96%     Intake/Output Summary (Last 24 hours) at 05/21/12 1512 Last data filed at 05/21/12 0900  Gross per 24 hour  Intake   1433 ml  Output   1390 ml  Net     43 ml   Filed Weights   05/20/12 2035  Weight: 75.8 kg (167 lb 1.7 oz)    Exam:   General:  Awake, alert, oriented, well-developed well-nourished elderly male, appears in good health   Cardiovascular: Regular rate and rhythm, no murmurs rubs or gallops   Respiratory: Clear to auscultation, no wheezes crackles or rales   Abdomen: Soft, nontender, nondistended, positive bowel sounds, no masses  Data Reviewed: Basic Metabolic Panel:  Recent Labs Lab 05/18/12 2306 05/20/12 1504 05/21/12 0700  NA 136 137 138  K 4.7 4.1 3.7  CL 99 101 106  CO2 25 25 24   GLUCOSE 135* 115* 107*  BUN 33* 37* 24*  CREATININE 1.41* 1.35 0.99  CALCIUM 9.3 9.5 8.5   Liver Function Tests:  Recent Labs Lab 05/18/12 2306  AST 38*  ALT 26  ALKPHOS 79  BILITOT 1.1  PROT 6.9  ALBUMIN 3.5   CBC:  Recent Labs Lab 05/18/12 2306 05/20/12 1504 05/21/12 0700  WBC 12.4* 7.6 5.7  NEUTROABS 9.9* 5.1  --   HGB 14.2 13.1 12.4*  HCT 40.9 38.3* 36.5*  MCV 89.3 89.7 88.6  PLT 97* 107* 101*   CBG:  Recent Labs Lab 05/20/12 2019  GLUCAP 158*    Recent Results (from the past 240 hour(s))  CULTURE, BLOOD (ROUTINE X 2)     Status: None   Collection Time    05/19/12  2:18 AM      Result Value Range Status   Specimen Description BLOOD  LEFT ARM   Final   Special Requests BOTTLES DRAWN AEROBIC AND ANAEROBIC 10CC   Final   Culture  Setup Time 05/19/2012 12:40   Final   Culture     Final   Value: ESCHERICHIA COLI     Note: Gram Stain Report Called to,Read Back By and Verified With: Alanson Aly RN on 05/20/12 at 04:00 by Rise Mu   Report Status PENDING   Incomplete  CULTURE, BLOOD (ROUTINE X 2)     Status: None   Collection Time    05/19/12  2:24 AM      Result Value Range Status   Specimen Description BLOOD LEFT HAND   Final   Special Requests BOTTLES DRAWN AEROBIC AND ANAEROBIC 10CCB, 5CCR   Final   Culture  Setup Time 05/19/2012 12:40   Final   Culture     Final   Value:        BLOOD CULTURE RECEIVED NO GROWTH TO DATE CULTURE WILL BE HELD FOR 5 DAYS BEFORE ISSUING A FINAL NEGATIVE REPORT   Report Status PENDING   Incomplete  URINE CULTURE     Status: None   Collection Time    05/19/12  2:24 AM      Result Value Range Status   Specimen Description URINE, CLEAN CATCH   Final   Special Requests NONE   Final   Culture  Setup Time 05/19/2012 12:38   Final   Colony Count >=100,000 COLONIES/ML   Final   Culture ESCHERICHIA COLI   Final   Report Status 05/21/2012 FINAL   Final   Organism ID, Bacteria ESCHERICHIA COLI   Final     Studies: No results found.  Scheduled Meds: . aspirin EC  325 mg Oral Daily  . atorvastatin  80 mg Oral QHS  . cefTRIAXone (ROCEPHIN)  IV  2 g Intravenous Q24H  . darifenacin  7.5 mg Oral Daily  . enoxaparin (LOVENOX) injection  40 mg Subcutaneous Q24H  . galantamine  8 mg Oral Daily  . isosorbide mononitrate  30 mg Oral Daily  . lisinopril  20 mg Oral Daily  . multivitamin with minerals  1 tablet Oral Daily  . polyvinyl alcohol  1 drop Both Eyes Daily   Continuous Infusions:   Principal Problem:   Bacteremia due to Escherichia coli Active Problems:   HYPERCHOLESTEROLEMIA   CORONARY ARTERY DISEASE   HYPERTENSION NEC   PROSTATE CANCER, HX OF   Urinary tract infection    Karen Kitchens  [facility] Pager [phone]. If 8PM-8AM, please contact night-coverage at [email or web address], password [facility] 05/21/2012, 3:12 PM  LOS: 1 day

## 2012-05-21 NOTE — Evaluation (Signed)
Physical Therapy Evaluation Patient Details Name: Ronald [name] MRN: [id number] DOB: 10/19/1927 Today's Date: 05/21/2012 Time: [id number] PT Time Calculation (min): 16 min  PT Assessment / Plan / Recommendation Clinical Impression  Pt adm with UTI.  Pt has been independent at home but pt and wife both report a two year history of stumbling, shuffling with gait.  Needs skilled PT to maximize I and safety to improve quality of life and reduce risks of falls.    PT Assessment  All further PT needs can be met in the next venue of care    Follow Up Recommendations  Home health PT    Does the patient have the potential to tolerate intense rehabilitation      Barriers to Discharge        Equipment Recommendations  None recommended by PT    Recommendations for Other Services     Frequency      Precautions / Restrictions Restrictions Weight Bearing Restrictions: No   Pertinent Vitals/Pain N/A      Mobility  Bed Mobility Bed Mobility: Supine to Sit;Sitting - Scoot to Edge of Bed Supine to Sit: 6: Modified independent (Device/Increase time);HOB elevated Sitting - Scoot to Edge of Bed: 6: Modified independent (Device/Increase time) Details for Bed Mobility Assistance: incr time Transfers Transfers: Sit to Stand;Stand to Sit Sit to Stand: 6: Modified independent (Device/Increase time);With upper extremity assist;From bed;From toilet Stand to Sit: 6: Modified independent (Device/Increase time);With upper extremity assist;To bed;To toilet Ambulation/Gait Ambulation/Gait Assistance: 5: Supervision Ambulation Distance (Feet): 250 Feet Assistive device: None Ambulation/Gait Assistance Details: Unsteady with turns but no LOB. Gait Pattern: Step-through pattern;Decreased stride length;Narrow base of support Gait velocity: decr    Exercises     PT Diagnosis: Difficulty walking  PT Problem List: Decreased balance PT Treatment Interventions:     PT Goals    Visit Information  Last PT Received On: 05/21/12 Assistance Needed: +1    Subjective Data  Subjective: Pt stated he has been clumsy at home for the last two years but only fell with this recent infection. 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    Solicitor Overall Cognitive Status: Appears within functional limits for tasks assessed/performed Arousal/Alertness: Awake/alert Orientation Level: Appears intact for tasks assessed Behavior During Session: [facility] for tasks performed    Extremity/Trunk Assessment Right Lower Extremity Assessment RLE ROM/Strength/Tone: [facility] for tasks assessed Left Lower Extremity Assessment LLE ROM/Strength/Tone: [facility] for tasks assessed   Balance Balance Balance Assessed: Yes Static Standing Balance Static Standing - Balance Support: No upper extremity supported;During functional activity Static Standing - Level of Assistance: 7: Independent  End of Session PT - End of Session Equipment Utilized During Treatment: Gait belt Activity Tolerance: Patient tolerated treatment well Patient left: in bed;with call bell/phone within reach;with family/visitor present Nurse Communication: Mobility status  GP Functional Assessment Tool Used: clinical judgement Functional Limitation: Mobility: Walking and moving around Mobility: Walking and Moving Around Current Status [phone]): At least 1 percent but less than 20 percent impaired, limited or restricted Mobility: Walking and Moving Around  Goal Status [phone]): 0 percent impaired,  limited or restricted Mobility: Walking and Moving Around Discharge Status [phone]): At least 1 percent but less than 20 percent impaired, limited or restricted   [facility] 05/21/2012, 12:00 PM  [facility] PT [phone]

## 2012-05-21 NOTE — Care Management Note (Addendum)
    Page 1 of 1   05/22/2012     11:01:23 AM   CARE MANAGEMENT NOTE 05/22/2012  Patient:  Ronald [name], Ronald [name]   Account Number:  [id number]  Date Initiated:  05/21/2012  Documentation initiated by:  Tomi Bamberger  Subjective/Objective Assessment:   dx bacteremia  admit- lives with spouse.     Action/Plan:   Anticipated DC Date:  05/22/2012   Anticipated DC Plan:  [place]  CM consult      [facility] Choice  HOME HEALTH   Choice offered to / List presented to:  C-3 Spouse           Status of service:  Completed, signed off Medicare Important Message given?   (If response is "NO", the following Medicare IM given date fields will be blank) Date Medicare IM given:   Date Additional Medicare IM given:    Discharge Disposition:  [place]  Per UR Regulation:  Reviewed for med. necessity/level of care/duration of stay  If discussed at [place] of Stay Meetings, dates discussed:    Comments:  05/22/12 11:00 [place] ,BSN [phone] [facility] notified that patient is for discharge today.  05/21/12 13:05 Tomi Bamberger RN, BSN [phone] patient lives with spouse, per physical therapy recs hhpt, spouse chose [facility] , referral made to [facility] [place] notified. Soc will begin 24-48 hrs post discharge.   Patient for possible dc tomorrow.

## 2012-05-21 NOTE — Progress Notes (Signed)
Addendum  Patient seen and examined, chart and data base reviewed.  I agree with the above assessment and plan.  For full details please see Mrs. Imogene Burn PA note.  Escherichia coli bacteremia, likely secondary to UTI, UTI culture is been susceptible except for ampicillin.    Birdie Hopes, MD [facility] Pager: [phone] 05/21/2012, 4:39 PM

## 2012-05-22 DIAGNOSIS — N200 Calculus of kidney: Secondary | ICD-10-CM

## 2012-05-22 LAB — CULTURE, BLOOD (ROUTINE X 2)

## 2012-05-22 LAB — URINE CULTURE
Colony Count: NO GROWTH
Culture: NO GROWTH

## 2012-05-22 MED ORDER — CEFUROXIME AXETIL 500 MG PO TABS: 500.0000 mg | ORAL_TABLET | Freq: Two times a day (BID) | ORAL | Status: DC

## 2012-05-22 NOTE — Discharge Summary (Signed)
Physician Discharge Summary  Ronald [name] [id number] DOB: [birth year] DOA: 05/20/2012  PCP: Nyoka Cowden, MD  Admit date: 05/20/2012 Discharge date: 05/22/2012  Time spent: 40 minutes  Recommendations for Outpatient Follow-up:  1. Follow up with PCP in 1 week  Discharge Diagnoses:  Principal Problem:   Bacteremia due to Escherichia coli Active Problems:   HYPERCHOLESTEROLEMIA   CORONARY ARTERY DISEASE   HYPERTENSION NEC   PROSTATE CANCER, HX OF   Urinary tract infection   Discharge Condition: Stable  Diet recommendation: Heart healthy diet  Filed Weights   05/20/12 2035  Weight: 75.8 kg (167 lb 1.7 oz)    History of present illness:  79 year old man with past medical history significant for CAD s/p CABG, prostate cancer s/p seed implant in 1998, recurrent nephrolithiasis and frequent UTI was told to come to the ER for abnormal lab(positive blood cultures).  Patient was seen in the ED on Feb 22 nd for weakness and chills for one day . As a part of work up, he was found to have a positive blood on his labs. He was given IV antibiotics and was discharged home with a prescription for Keflex. Patient states since his discharge from the emergency room he has started to feel better. He is not urinating as frequently. He has not had any fever. He has not had any vomiting or diarrhea. He has taken doses of his Keflex as directed.  His laboratory tests showed blood culture that was positive for Escherichia coli. ER physician wants to admit the patient for observation and IV antibiotics.  Hospital Course:   1. Escherichia coli bacteremia: Patient was presented to the emergency department 2 days prior to admission with UTI, he was discharged home on Keflex and the culture comes back positive for gram-negative bacteremia so he was called by his primary care physician to come back to the hospital for further evaluation. Upon admission to the hospital patient was empirically  started on Rocephin, the culture comes back positive for Escherichia coli bacteremia which is susceptible to Rocephin and fluoroquinolones. Patient is allergic to ciprofloxacin so he was discharged on Ceftin to complete a total 14 days of antibiotics. Patient to followup with his primary care physician within one week.  2. 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 Patient follows with His urologist as outpatient.  3. Hypertension: Prior to admission patient was on multiple medication including lisinopril, Imdur and Sanctura. Blood pressure stable his preadmission medication was continued throughout the hospital stay.   4. History of prostate cancer: Currently asymptomatic, follows with his primary urologist/oncologist.  5. History of recurrent falls: Patient seen by physical therapy in the hospital and recommended home health physical therapy for treatment and safety evaluation.  Procedures:  None  Consultations:  None  Discharge Exam: Filed Vitals:   05/21/12 1447 05/21/12 1449 05/21/12 2232 05/22/12 0430  BP: 108/56 110/51 139/61 139/61  Pulse: 67 75 67 56  Temp:   97.1 F (36.2 C) 97.8 F (36.6 C)  TempSrc:    Oral  Resp:   16 16  Height:      Weight:      SpO2:   96% 95%   General: Alert and awake, oriented x3, not in any acute distress. HEENT: anicteric sclera, pupils reactive to light and accommodation, EOMI CVS: S1-S2 clear, no murmur rubs or gallops Chest: clear to auscultation bilaterally, no wheezing, rales or rhonchi Abdomen: soft  nontender, nondistended, normal bowel sounds, no organomegaly Extremities: no cyanosis, clubbing or edema noted bilaterally Neuro: Cranial nerves II-XII intact, no focal neurological deficits  Discharge Instructions  Discharge Orders   Future Orders Complete By Expires     Diet - low sodium heart healthy  As directed     Increase activity  slowly  As directed         Medication List    STOP taking these medications       cephALEXin 750 MG capsule  Commonly known as:  KEFLEX      TAKE these medications       acetaminophen 500 MG tablet  Commonly known as:  TYLENOL  Take 500 mg by mouth every 6 (six) hours as needed for pain or fever. Take 1 -2 pills     aspirin EC 325 MG tablet  Take 325 mg by mouth daily.     atorvastatin 80 MG tablet  Commonly known as:  LIPITOR  Take 80 mg by mouth daily.     CALCIUM + D PO  Take 1 tablet by mouth 2 (two) times daily.     cefUROXime 500 MG tablet  Commonly known as:  CEFTIN  Take 1 tablet (500 mg total) by mouth 2 (two) times daily.     galantamine 8 MG tablet  Commonly known as:  RAZADYNE  Take 8 mg by mouth daily.     hydroxypropyl methylcellulose 2.5 % ophthalmic solution  Commonly known as:  ISOPTO TEARS  Place 1 drop into both eyes daily.     isosorbide mononitrate 30 MG 24 hr tablet  Commonly known as:  IMDUR  Take 30 mg by mouth daily.     lisinopril 20 MG tablet  Commonly known as:  PRINIVIL,ZESTRIL  Take 20 mg by mouth daily.     multivitamin with minerals Tabs  Take 1 tablet by mouth daily.     nitroGLYCERIN 0.4 MG SL tablet  Commonly known as:  NITROSTAT  Place 0.4 mg under the tongue every 5 (five) minutes as needed for chest pain.     trospium 20 MG tablet  Commonly known as:  SANCTURA  Take 20 mg by mouth 2 (two) times daily.           Follow-up Information   Follow up with Nyoka Cowden, MD In 1 week.   Contact information:   [address] [place] [address] [phone]        The results of significant diagnostics from this hospitalization (including imaging, microbiology, ancillary and laboratory) are listed below for reference.    Significant Diagnostic Studies: Dg Chest Portable 1 View  05/19/2012  *RADIOLOGY REPORT*  Clinical Data: Weakness and loss of balance.  PORTABLE CHEST - 1 VIEW  Comparison:  06/16/2011  Findings: Stable appearance of mediastinal postoperative changes. Shallow inspiration.  Increased heart size with mild pulmonary vascular congestion.  No edema.  Atelectasis or infiltration in both lung bases.  No pneumothorax.  Increased density in the right lung apex likely represents prominent vascular shadows.  Pleural thickening is not excluded.  Degenerative changes in the shoulders.  IMPRESSION: Cardiac enlargement with mild pulmonary vascular congestion. Linear atelectasis or infiltration in the lung bases.  Shallow inspiration.  Probable vascular shadow opacifying the right apex.   Original Report Authenticated By: Lucienne Capers, M.D.     Microbiology: Recent Results (from the past 240 hour(s))  CULTURE, BLOOD (ROUTINE X 2)     Status: None   Collection  Time    05/19/12  2:18 AM      Result Value Range Status   Specimen Description BLOOD LEFT ARM   Final   Special Requests BOTTLES DRAWN AEROBIC AND ANAEROBIC 10CC   Final   Culture  Setup Time 05/19/2012 12:40   Final   Culture     Final   Value: ESCHERICHIA COLI     Note: Gram Stain Report Called to,Read Back By and Verified With: Alanson Aly RN on 05/20/12 at 04:00 by Rise Mu   Report Status 05/22/2012 FINAL   Final   Organism ID, Bacteria ESCHERICHIA COLI   Final  CULTURE, BLOOD (ROUTINE X 2)     Status: None   Collection Time    05/19/12  2:24 AM      Result Value Range Status   Specimen Description BLOOD LEFT HAND   Final   Special Requests BOTTLES DRAWN AEROBIC AND ANAEROBIC 10CCB, 5CCR   Final   Culture  Setup Time 05/19/2012 12:40   Final   Culture     Final   Value:        BLOOD CULTURE RECEIVED NO GROWTH TO DATE CULTURE WILL BE HELD FOR 5 DAYS BEFORE ISSUING A FINAL NEGATIVE REPORT   Report Status PENDING   Incomplete  URINE CULTURE     Status: None   Collection Time    05/19/12  2:24 AM      Result Value Range Status   Specimen Description URINE, CLEAN CATCH   Final   Special Requests NONE   Final    Culture  Setup Time 05/19/2012 12:38   Final   Colony Count >=100,000 COLONIES/ML   Final   Culture ESCHERICHIA COLI   Final   Report Status 05/21/2012 FINAL   Final   Organism ID, Bacteria ESCHERICHIA COLI   Final  CULTURE, BLOOD (ROUTINE X 2)     Status: None   Collection Time    05/20/12  3:05 PM      Result Value Range Status   Specimen Description BLOOD RIGHT ARM   Final   Special Requests BOTTLES DRAWN AEROBIC ONLY 10CC   Final   Culture  Setup Time 05/21/2012 00:33   Final   Culture     Final   Value:        BLOOD CULTURE RECEIVED NO GROWTH TO DATE CULTURE WILL BE HELD FOR 5 DAYS BEFORE ISSUING A FINAL NEGATIVE REPORT   Report Status PENDING   Incomplete  CULTURE, BLOOD (ROUTINE X 2)     Status: None   Collection Time    05/20/12  3:15 PM      Result Value Range Status   Specimen Description BLOOD RIGHT HAND   Final   Special Requests BOTTLES DRAWN AEROBIC AND ANAEROBIC 10CC   Final   Culture  Setup Time 05/21/2012 00:33   Final   Culture     Final   Value:        BLOOD CULTURE RECEIVED NO GROWTH TO DATE CULTURE WILL BE HELD FOR 5 DAYS BEFORE ISSUING A FINAL NEGATIVE REPORT   Report Status PENDING   Incomplete  URINE CULTURE     Status: None   Collection Time    05/20/12  4:37 PM      Result Value Range Status   Specimen Description URINE, CLEAN CATCH   Final   Special Requests NONE   Final   Culture  Setup Time 05/21/2012 00:48   Final   Colony  Count NO GROWTH   Final   Culture NO GROWTH   Final   Report Status 05/22/2012 FINAL   Final     Labs: Basic Metabolic Panel:  Recent Labs Lab 05/18/12 2306 05/20/12 1504 05/21/12 0700  NA 136 137 138  K 4.7 4.1 3.7  CL 99 101 106  CO2 25 25 24   GLUCOSE 135* 115* 107*  BUN 33* 37* 24*  CREATININE 1.41* 1.35 0.99  CALCIUM 9.3 9.5 8.5   Liver Function Tests:  Recent Labs Lab 05/18/12 2306  AST 38*  ALT 26  ALKPHOS 79  BILITOT 1.1  PROT 6.9  ALBUMIN 3.5   No results found for this basename: LIPASE,  AMYLASE,  in the last 168 hours No results found for this basename: AMMONIA,  in the last 168 hours CBC:  Recent Labs Lab 05/18/12 2306 05/20/12 1504 05/21/12 0700  WBC 12.4* 7.6 5.7  NEUTROABS 9.9* 5.1  --   HGB 14.2 13.1 12.4*  HCT 40.9 38.3* 36.5*  MCV 89.3 89.7 88.6  PLT 97* 107* 101*   Cardiac Enzymes: No results found for this basename: CKTOTAL, CKMB, CKMBINDEX, TROPONINI,  in the last 168 hours BNP: BNP (last 3 results) No results found for this basename: PROBNP,  in the last 8760 hours CBG:  Recent Labs Lab 05/20/12 2019  GLUCAP 158*       Signed:  Kahlen Morais A  [facility] 05/22/2012, 10:40 AM

## 2012-05-22 NOTE — Progress Notes (Signed)
Pt. discharged to floor,verbalized understanding of discharged instruction,medication,restriction,diet and follow up appointment.Baseline Vitals sign stable,Pt comfortable,no sign and symptom of distress. 

## 2012-05-23 ENCOUNTER — Telehealth: Payer: Self-pay | Admitting: *Deleted

## 2012-05-23 NOTE — Telephone Encounter (Signed)
Transitional Care management   Admission date:05/20/2012 Discharge date:05/22/2012 Wife states he was discharged 05-23-2012  Discharge Diagnoses: Principal problem:  Bacteremia due to Escherichia coli Active  Problems:  Hypercholesterolemia  CAD  HTN  Prostate Cancer  Urinary tract infection   Talked with patients wife and she state he is doing well.  States he had been discharged on 05-22-2012.  He is eating and drinking quite good.  He has been afebrile since discharge ,but still some painful urination. Wife is concerned about antibiotic and had she gotten the correct amount.  Decided she will discuss with md at f/u hospital. Pt is compliant with medication. Wife is knowledgeable of condition and treatment.    Follow up hospital office visit is 05-25-2012 at 2:45.

## 2012-05-23 NOTE — Telephone Encounter (Signed)
Transitional Care Management

## 2012-05-25 ENCOUNTER — Ambulatory Visit (INDEPENDENT_AMBULATORY_CARE_PROVIDER_SITE_OTHER): Payer: Medicare Other | Admitting: Internal Medicine

## 2012-05-25 ENCOUNTER — Encounter: Payer: Self-pay | Admitting: Internal Medicine

## 2012-05-25 VITALS — BP 100/60 | HR 64 | Temp 97.4°F | Resp 18 | Wt 164.0 lb

## 2012-05-25 DIAGNOSIS — N390 Urinary tract infection, site not specified: Secondary | ICD-10-CM

## 2012-05-25 DIAGNOSIS — I2510 Atherosclerotic heart disease of native coronary artery without angina pectoris: Secondary | ICD-10-CM

## 2012-05-25 DIAGNOSIS — Z8546 Personal history of malignant neoplasm of prostate: Secondary | ICD-10-CM

## 2012-05-25 DIAGNOSIS — N200 Calculus of kidney: Secondary | ICD-10-CM

## 2012-05-25 DIAGNOSIS — R7881 Bacteremia: Secondary | ICD-10-CM

## 2012-05-25 LAB — CULTURE, BLOOD (ROUTINE X 2): Culture: NO GROWTH

## 2012-05-25 MED ORDER — URIBEL 118 MG PO CAPS: 1.0000 | ORAL_CAPSULE | Freq: Four times a day (QID) | ORAL | Status: DC

## 2012-05-25 NOTE — Patient Instructions (Signed)
Followup urology  Take your antibiotic as prescribed until ALL of it is gone, but stop if you develop a rash, swelling, or any side effects of the medication.  Contact our office as soon as possible if  there are side effects of the medication.  Return in 4 months for follow-up

## 2012-05-25 NOTE — Progress Notes (Signed)
Subjective:    Patient ID: Ronald [name], male    DOB: 10/17/1927, 79 y.o.   MRN: [id number]  HPI 82  -year-old patient who is seen following a hospital discharge. He was admitted for evaluation of urosepsis with Escherichia coli bacteremia. He is completing the oral antibiotic therapy. He has been seen by urology and does have a history of renal stones. He continues to have the dysuria and states that he is quite uncomfortable. The fever and chills have resolved and clinically he looks well. He is scheduled for urology followup next month. He has remote history of bladder cancer and is status post seed implantation in 1997.  Past Medical History  Diagnosis Date  . Coronary artery disease     11/89 CABG  (70% circ; 90% PD; 60-70% distal left main; 30% left circ; 90% 1st diag;, 2nd diag and 3rd diag. w/90% mild LAD stenosis and 60-70% prox. stenosisin the LAD)  . Kidney stone     1970  . S/P T&A (status post tonsillectomy and adenoidectomy)     1954  . Cancer     prostate cancer and seed implant 1998  . Hyperlipidemia   . Dementia     mild  . Normal nuclear stress test July 2011  . Myocardial infarction     History   Social History  . Marital Status: Married    Spouse Name: N/A    Number of Children: N/A  . Years of Education: N/A   Occupational History  . Not on file.   Social History Main Topics  . Smoking status: Former Research scientist (life sciences)  . Smokeless tobacco: Not on file  . Alcohol Use: No  . Drug Use: No  . Sexually Active: Not on file   Other Topics Concern  . Not on file   Social History Narrative  . No narrative on file    Past Surgical History  Procedure Laterality Date  . Coronary artery bypass graft      11/89 CABG  (70% circ; 90% PD; 60-70% distal left main; 30% left circ; 90% 1st diag;, 2nd diag and 3rd diag. w/90% mild LAD stenosis and 60-70% prox. stenosisin the LAD. Redo bypass in 1997  . Tonsillectomy    . Cholecystectomy    . Eus  10/05/2011    Procedure:  ESOPHAGEAL ENDOSCOPIC ULTRASOUND (EUS) RADIAL;  Surgeon: Arta Silence, MD;  Location: [facility];  Service: Endoscopy;  Laterality: N/A;    Family History  Problem Relation Age of Onset  . Heart disease Mother     Allergies  Allergen Reactions  . Ciprofloxacin Swelling    Allergy classified as swelling here, but pt says Cipro just doesn't work for him    . Nitrofurantoin Hives    Hives that lasted for weeks- he had to go to the hospital   . Sulfonamide Derivatives Hives         Current Outpatient Prescriptions on File Prior to Visit  Medication Sig Dispense Refill  . acetaminophen (TYLENOL) 500 MG tablet Take 500 mg by mouth every 6 (six) hours as needed for pain or fever. Take 1 -2 pills      . aspirin EC 325 MG tablet Take 325 mg by mouth daily.      Marland Kitchen atorvastatin (LIPITOR) 80 MG tablet Take 80 mg by mouth daily.      . Calcium Carbonate-Vitamin D (CALCIUM + D PO) Take 1 tablet by mouth 2 (two) times daily.       . cefUROXime (  CEFTIN) 500 MG tablet Take 1 tablet (500 mg total) by mouth 2 (two) times daily.  12 tablet  0  . galantamine (RAZADYNE) 8 MG tablet Take 8 mg by mouth daily.       . hydroxypropyl methylcellulose (ISOPTO TEARS) 2.5 % ophthalmic solution Place 1 drop into both eyes daily.      . isosorbide mononitrate (IMDUR) 30 MG 24 hr tablet Take 30 mg by mouth daily.        Marland Kitchen lisinopril (PRINIVIL,ZESTRIL) 20 MG tablet Take 20 mg by mouth daily.        . Multiple Vitamin (MULTIVITAMIN WITH MINERALS) TABS Take 1 tablet by mouth daily.      . nitroGLYCERIN (NITROSTAT) 0.4 MG SL tablet Place 0.4 mg under the tongue every 5 (five) minutes as needed for chest pain.       . trospium (SANCTURA) 20 MG tablet Take 20 mg by mouth 2 (two) times daily.       No current facility-administered medications on file prior to visit.    BP 100/60  Pulse 64  Temp(Src) 97.4 F (36.3 C) (Oral)  Resp 18  Wt 164 lb (74.39 kg)  BMI 27.29 kg/m2  SpO2 96%       Review of  Systems  Constitutional: Negative for fever, chills, appetite change and fatigue.  HENT: Negative for hearing loss, ear pain, congestion, sore throat, trouble swallowing, neck stiffness, dental problem, voice change and tinnitus.   Eyes: Negative for pain, discharge and visual disturbance.  Respiratory: Negative for cough, chest tightness, wheezing and stridor.   Cardiovascular: Negative for chest pain, palpitations and leg swelling.  Gastrointestinal: Negative for nausea, vomiting, abdominal pain, diarrhea, constipation, blood in stool and abdominal distention.  Genitourinary: Positive for dysuria and difficulty urinating. Negative for urgency, hematuria, flank pain, discharge and genital sores.  Musculoskeletal: Negative for myalgias, back pain, joint swelling, arthralgias and gait problem.  Skin: Negative for rash.  Neurological: Negative for dizziness, syncope, speech difficulty, weakness, numbness and headaches.  Hematological: Negative for adenopathy. Does not bruise/bleed easily.  Psychiatric/Behavioral: Negative for behavioral problems and dysphoric mood. The patient is not nervous/anxious.        Objective:   Physical Exam  Constitutional: He is oriented to person, place, and time. He appears well-developed.  HENT:  Head: Normocephalic.  Right Ear: External ear normal.  Left Ear: External ear normal.  Eyes: Conjunctivae and EOM are normal.  Neck: Normal range of motion.  Cardiovascular: Normal rate and normal heart sounds.   Pulmonary/Chest: Breath sounds normal.  Abdominal: Soft. Bowel sounds are normal. He exhibits no distension. There is no tenderness. There is no rebound and no guarding.  No evidence of bladder outlet obstruction. No tenderness.  Musculoskeletal: Normal range of motion. He exhibits no edema and no tenderness.  Neurological: He is alert and oriented to person, place, and time.  Psychiatric: He has a normal mood and affect. His behavior is normal.           Assessment & Plan:   Status post Escherichia coli UTI with bacteremia Coronary artery disease Hypertension  Patient will complete antibiotic therapy; we'll treat symptomatically with your [facility] followup  Recheck here in 4 months

## 2012-05-27 LAB — CULTURE, BLOOD (ROUTINE X 2)
Culture: NO GROWTH
Culture: NO GROWTH

## 2012-06-04 DIAGNOSIS — R300 Dysuria: Secondary | ICD-10-CM | POA: Diagnosis not present

## 2012-06-04 DIAGNOSIS — Z8546 Personal history of malignant neoplasm of prostate: Secondary | ICD-10-CM | POA: Diagnosis not present

## 2012-06-04 DIAGNOSIS — N3020 Other chronic cystitis without hematuria: Secondary | ICD-10-CM | POA: Diagnosis not present

## 2012-06-21 DIAGNOSIS — I2510 Atherosclerotic heart disease of native coronary artery without angina pectoris: Secondary | ICD-10-CM

## 2012-06-21 DIAGNOSIS — Z8546 Personal history of malignant neoplasm of prostate: Secondary | ICD-10-CM

## 2012-06-21 DIAGNOSIS — N390 Urinary tract infection, site not specified: Secondary | ICD-10-CM

## 2012-06-21 DIAGNOSIS — N200 Calculus of kidney: Secondary | ICD-10-CM

## 2012-06-21 DIAGNOSIS — R7881 Bacteremia: Secondary | ICD-10-CM

## 2012-06-21 DIAGNOSIS — A498 Other bacterial infections of unspecified site: Secondary | ICD-10-CM

## 2012-06-29 DIAGNOSIS — R310 Gross hematuria: Secondary | ICD-10-CM | POA: Diagnosis not present

## 2012-06-29 DIAGNOSIS — N3020 Other chronic cystitis without hematuria: Secondary | ICD-10-CM | POA: Diagnosis not present

## 2012-06-29 DIAGNOSIS — C61 Malignant neoplasm of prostate: Secondary | ICD-10-CM | POA: Diagnosis not present

## 2012-06-29 DIAGNOSIS — N318 Other neuromuscular dysfunction of bladder: Secondary | ICD-10-CM | POA: Diagnosis not present

## 2012-07-02 ENCOUNTER — Encounter: Payer: Self-pay | Admitting: Cardiovascular Disease

## 2012-08-06 ENCOUNTER — Ambulatory Visit: Payer: Medicare Other | Admitting: Cardiovascular Disease

## 2012-08-09 ENCOUNTER — Ambulatory Visit (INDEPENDENT_AMBULATORY_CARE_PROVIDER_SITE_OTHER): Payer: Medicare Other | Admitting: Cardiovascular Disease

## 2012-08-09 ENCOUNTER — Encounter: Payer: Self-pay | Admitting: Cardiovascular Disease

## 2012-08-09 VITALS — BP 92/51 | HR 60 | Ht 65.0 in | Wt 164.0 lb

## 2012-08-09 DIAGNOSIS — I25810 Atherosclerosis of coronary artery bypass graft(s) without angina pectoris: Secondary | ICD-10-CM | POA: Diagnosis not present

## 2012-08-09 MED ORDER — LISINOPRIL 10 MG PO TABS: 10.0000 mg | ORAL_TABLET | Freq: Every day | ORAL | Status: DC

## 2012-08-09 NOTE — Patient Instructions (Addendum)
Your physician wants you to follow-up in:  6 months.  You will receive a reminder letter in the mail two months in advance. If you don't receive a letter, please call our office to schedule the follow-up appointment.  Your physician has recommended you make the following change in your medication:  Decrease Lisinopril to 10 mg by mouth daily

## 2012-08-09 NOTE — Progress Notes (Signed)
History of Present Illness: 79 yo male with history of CAD s/p CABG in 1989 and redo bypass in 1997 per Dr. Redmond Pulling, nephrolithiasis, HTN, HLD, mild dementia, prostate cancer here today for cardiac follow up. He has been followed in the past by Dr. Doreatha Lew. He was seen by Truitt Merle, NP in July 2012.  I saw him last in November 2013. Leane Call May 2013 because of dyspnea and fatigue with h/o CAD. This showed inferior scar with soft tissue attenuation and small region of anterior scar with possible small area of ischemia. This was felt to be low risk. He has been managed conservatively. Admitted to [facility] February 2014 with UTI.   He is here today for follow up. He has no exertional chest pain or pressure. He did have one episode of heartburn 2 weeks ago that lasted for 15 minutes, resolved with antacid tablets. He has been working in the yard with no chest pain. No dizziness.   Primary Care Physician: Dr. Burnice Logan  Last Lipid Profile:Lipid Panel     Component Value Date/Time   CHOL 152 07/07/2011 0931   TRIG 48.0 07/07/2011 0931   HDL 55.40 07/07/2011 0931   CHOLHDL 3 07/07/2011 0931   VLDL 9.6 07/07/2011 0931   LDLCALC 87 07/07/2011 0931     Past Medical History  Diagnosis Date  . Coronary artery disease     11/89 CABG  (70% circ; 90% PD; 60-70% distal left main; 30% left circ; 90% 1st diag;, 2nd diag and 3rd diag. w/90% mild LAD stenosis and 60-70% prox. stenosisin the LAD)  . Kidney stone     1970  . S/P T&A (status post tonsillectomy and adenoidectomy)     1954  . Cancer     prostate cancer and seed implant 1998  . Hyperlipidemia   . Dementia     mild  . Normal nuclear stress test July 2011  . Myocardial infarction     Past Surgical History  Procedure Laterality Date  . Coronary artery bypass graft      11/89 CABG  (70% circ; 90% PD; 60-70% distal left main; 30% left circ; 90% 1st diag;, 2nd diag and 3rd diag. w/90% mild LAD stenosis and 60-70% prox. stenosisin the  LAD. Redo bypass in 1997  . Tonsillectomy    . Cholecystectomy    . Eus  10/05/2011    Procedure: ESOPHAGEAL ENDOSCOPIC ULTRASOUND (EUS) RADIAL;  Surgeon: Arta Silence, MD;  Location: [facility];  Service: Endoscopy;  Laterality: N/A;    Current Outpatient Prescriptions  Medication Sig Dispense Refill  . acetaminophen (TYLENOL) 500 MG tablet Take 500 mg by mouth every 6 (six) hours as needed for pain or fever. Take 1 -2 pills      . aspirin EC 325 MG tablet Take 325 mg by mouth daily.      Marland Kitchen atorvastatin (LIPITOR) 80 MG tablet Take 80 mg by mouth daily.      . Calcium Carbonate-Vitamin D (CALCIUM + D PO) Take 1 tablet by mouth 2 (two) times daily.       . cefUROXime (CEFTIN) 500 MG tablet Take 1 tablet (500 mg total) by mouth 2 (two) times daily.  12 tablet  0  . hydroxypropyl methylcellulose (ISOPTO TEARS) 2.5 % ophthalmic solution Place 1 drop into both eyes daily.      . isosorbide mononitrate (IMDUR) 30 MG 24 hr tablet Take 30 mg by mouth daily.        Marland Kitchen lisinopril (PRINIVIL,ZESTRIL) 20 MG  tablet Take 20 mg by mouth daily.        . Multiple Vitamin (MULTIVITAMIN WITH MINERALS) TABS Take 1 tablet by mouth daily.      . nitroGLYCERIN (NITROSTAT) 0.4 MG SL tablet Place 0.4 mg under the tongue every 5 (five) minutes as needed for chest pain.       . rosuvastatin (CRESTOR) 10 MG tablet 40 mg. Pt states he gets 40 mg and cuts in half for a 20 mg dose      . tamsulosin (FLOMAX) 0.4 MG CAPS Take 1 mg by mouth 1 day or 1 dose.       No current facility-administered medications for this visit.    Allergies  Allergen Reactions  . Ciprofloxacin Swelling    Allergy classified as swelling here, but pt says Cipro just doesn't work for him    . Nitrofurantoin Hives    Hives that lasted for weeks- he had to go to the hospital   . Sulfonamide Derivatives Hives         History   Social History  . Marital Status: Married    Spouse Name: N/A    Number of Children: N/A  . Years of  Education: N/A   Occupational History  . Not on file.   Social History Main Topics  . Smoking status: Former Research scientist (life sciences)  . Smokeless tobacco: Not on file  . Alcohol Use: No  . Drug Use: No  . Sexually Active: Not on file   Other Topics Concern  . Not on file   Social History Narrative  . No narrative on file    Family History  Problem Relation Age of Onset  . Heart disease Mother     Review of Systems:  As stated in the HPI and otherwise negative.   BP 92/51  Pulse 60  Ht 5\' 5"  (1.651 m)  Wt 164 lb (74.39 kg)  BMI 27.29 kg/m2  Physical Examination: General: Well developed, well nourished, NAD HEENT: OP clear, mucus membranes moist SKIN: warm, dry. No rashes. Neuro: No focal deficits Musculoskeletal: Muscle strength 5/5 all ext Psychiatric: Mood and affect normal Neck: No JVD, no carotid bruits, no thyromegaly, no lymphadenopathy. Lungs:Clear bilaterally, no wheezes, rhonci, crackles Cardiovascular: Regular rate and rhythm. No murmurs, gallops or rubs. Abdomen:Soft. Bowel sounds present. Non-tender.  Extremities: No lower extremity edema. Pulses are 2 + in the bilateral DP/PT.  EKG: Sinus, rate 60 bpm. 1st degree AV block. LVH. Old  Inferior infarct.   Assessment and Plan:   1. CORONARY ARTERY DISEASE: Stable. Low risk stress test May 2013.  Continue statin, ASA, Ace-inh, Imdur. No beta blocker secondary to bradycardia. Will lower Lisinopril dose to 10 mg po Qdaily with hypotension. Hopefully this will help with his fatigue.

## 2012-08-29 DIAGNOSIS — N318 Other neuromuscular dysfunction of bladder: Secondary | ICD-10-CM | POA: Diagnosis not present

## 2012-12-13 ENCOUNTER — Other Ambulatory Visit: Payer: Self-pay | Admitting: Gastroenterology

## 2012-12-13 DIAGNOSIS — K862 Cyst of pancreas: Secondary | ICD-10-CM

## 2012-12-21 ENCOUNTER — Ambulatory Visit
Admission: RE | Admit: 2012-12-21 | Discharge: 2012-12-21 | Disposition: A | Discharge status: Home / Self Care | Payer: Medicare Other | Source: Ambulatory Visit | Attending: Gastroenterology | Admitting: Gastroenterology

## 2012-12-21 DIAGNOSIS — K862 Cyst of pancreas: Secondary | ICD-10-CM

## 2012-12-21 MED ORDER — GADOBENATE DIMEGLUMINE 529 MG/ML IV SOLN
15.0000 mL | Freq: Once | INTRAVENOUS | Status: AC | PRN
  Administered 2012-12-21: 15 mL via INTRAVENOUS

## 2012-12-28 DIAGNOSIS — N3020 Other chronic cystitis without hematuria: Secondary | ICD-10-CM | POA: Diagnosis not present

## 2012-12-28 DIAGNOSIS — R350 Frequency of micturition: Secondary | ICD-10-CM | POA: Diagnosis not present

## 2012-12-28 DIAGNOSIS — N318 Other neuromuscular dysfunction of bladder: Secondary | ICD-10-CM | POA: Diagnosis not present

## 2013-01-24 DIAGNOSIS — Z23 Encounter for immunization: Secondary | ICD-10-CM | POA: Diagnosis not present

## 2013-01-29 DIAGNOSIS — N3020 Other chronic cystitis without hematuria: Secondary | ICD-10-CM | POA: Diagnosis not present

## 2013-01-29 DIAGNOSIS — C61 Malignant neoplasm of prostate: Secondary | ICD-10-CM | POA: Diagnosis not present

## 2013-01-29 DIAGNOSIS — N318 Other neuromuscular dysfunction of bladder: Secondary | ICD-10-CM | POA: Diagnosis not present

## 2013-01-29 DIAGNOSIS — R350 Frequency of micturition: Secondary | ICD-10-CM | POA: Diagnosis not present

## 2013-01-31 ENCOUNTER — Telehealth: Payer: Self-pay | Admitting: Internal Medicine

## 2013-01-31 NOTE — Telephone Encounter (Signed)
Recd records from [facility], forwarding 5pgs to [facility]

## 2013-02-05 ENCOUNTER — Encounter: Payer: Self-pay | Admitting: Cardiovascular Disease

## 2013-02-05 ENCOUNTER — Ambulatory Visit (INDEPENDENT_AMBULATORY_CARE_PROVIDER_SITE_OTHER): Payer: Medicare Other | Admitting: Cardiovascular Disease

## 2013-02-05 VITALS — BP 95/52 | HR 61 | Ht 65.0 in | Wt 159.0 lb

## 2013-02-05 DIAGNOSIS — I2510 Atherosclerotic heart disease of native coronary artery without angina pectoris: Secondary | ICD-10-CM | POA: Diagnosis not present

## 2013-02-05 DIAGNOSIS — I951 Orthostatic hypotension: Secondary | ICD-10-CM

## 2013-02-05 DIAGNOSIS — E785 Hyperlipidemia, unspecified: Secondary | ICD-10-CM

## 2013-02-05 LAB — LIPID PANEL
Cholesterol: 130 mg/dL (ref 0–200)
HDL: 46.2 mg/dL (ref >39.00)
LDL Cholesterol: 72 mg/dL (ref 0–99)
Total CHOL/HDL Ratio: 3
Triglycerides: 58 mg/dL (ref 0.0–149.0)
VLDL: 11.6 mg/dL (ref 0.0–40.0)

## 2013-02-05 NOTE — Patient Instructions (Signed)
Your physician wants you to follow-up in:  6 months.  You will receive a reminder letter in the mail two months in advance. If you don't receive a letter, please call our office to schedule the follow-up appointment.  Your physician has recommended you make the following change in your medication:  Stop Lisinopril  We will make referral to Cardiac Rehab at [facility] for you.

## 2013-02-05 NOTE — Progress Notes (Signed)
History of Present Illness: 79 yo male with history of CAD s/p CABG in 1989 and redo bypass in 1997 per Dr. Redmond Pulling, nephrolithiasis, HTN, HLD, mild dementia, prostate cancer here today for cardiac follow up. He has been followed in the past by Dr. Doreatha Lew. Leane Call May 2013 because of dyspnea and fatigue with h/o CAD. This showed inferior scar with soft tissue attenuation and small region of anterior scar with possible small area of ischemia. This was felt to be low risk. He has been managed conservatively. At his last visit here in May 2014 he c/o fatigue. His Lisinopril was lowered to 10 mg per day.   He is here today for follow up. He has no exertional chest pain or pressure. He has been working in the yard with no chest pain. No dizziness. He continues to have issues with UTI/bladder spasm and has been followed in [facility].   Primary Care Physician: Dr. Burnice Logan  Last Lipid Profile:Lipid Panel     Component Value Date/Time   CHOL 152 07/07/2011 0931   TRIG 48.0 07/07/2011 0931   HDL 55.40 07/07/2011 0931   CHOLHDL 3 07/07/2011 0931   VLDL 9.6 07/07/2011 0931   LDLCALC 87 07/07/2011 0931    Past Medical History  Diagnosis Date  . Coronary artery disease     11/89 CABG  (70% circ; 90% PD; 60-70% distal left main; 30% left circ; 90% 1st diag;, 2nd diag and 3rd diag. w/90% mild LAD stenosis and 60-70% prox. stenosisin the LAD)  . Kidney stone     1970  . S/P T&A (status post tonsillectomy and adenoidectomy)     1954  . Cancer     prostate cancer and seed implant 1998  . Hyperlipidemia   . Dementia     mild  . Normal nuclear stress test July 2011  . Myocardial infarction     Past Surgical History  Procedure Laterality Date  . Coronary artery bypass graft      11/89 CABG  (70% circ; 90% PD; 60-70% distal left main; 30% left circ; 90% 1st diag;, 2nd diag and 3rd diag. w/90% mild LAD stenosis and 60-70% prox. stenosisin the LAD. Redo bypass in 1997  .  Tonsillectomy    . Cholecystectomy    . Eus  10/05/2011    Procedure: ESOPHAGEAL ENDOSCOPIC ULTRASOUND (EUS) RADIAL;  Surgeon: Arta Silence, MD;  Location: [facility];  Service: Endoscopy;  Laterality: N/A;    Current Outpatient Prescriptions  Medication Sig Dispense Refill  . acetaminophen (TYLENOL) 500 MG tablet Take 500 mg by mouth every 6 (six) hours as needed for pain or fever. Take 1 -2 pills      . aspirin EC 325 MG tablet Take 325 mg by mouth daily.      Marland Kitchen atorvastatin (LIPITOR) 80 MG tablet Take 80 mg by mouth daily.      . Calcium Carbonate-Vitamin D (CALCIUM + D PO) Take 1 tablet by mouth 2 (two) times daily.       Marland Kitchen galantamine (RAZADYNE ER) 8 MG 24 hr capsule Take 8 mg by mouth daily with breakfast.      . hydroxypropyl methylcellulose (ISOPTO TEARS) 2.5 % ophthalmic solution Place 1 drop into both eyes daily.      . isosorbide mononitrate (IMDUR) 30 MG 24 hr tablet Take 30 mg by mouth daily.        Marland Kitchen lisinopril (PRINIVIL,ZESTRIL) 10 MG tablet Take 1 tablet (10 mg total) by mouth daily.  30 tablet  6  . Meth-Hyo-M Bl-Na Phos-Ph Sal (URIBEL) 118 MG CAPS Take by mouth as needed.      . Multiple Vitamin (MULTIVITAMIN WITH MINERALS) TABS Take 1 tablet by mouth daily.      . nitroGLYCERIN (NITROSTAT) 0.4 MG SL tablet Place 0.4 mg under the tongue every 5 (five) minutes as needed for chest pain.        No current facility-administered medications for this visit.    Allergies  Allergen Reactions  . Ciprofloxacin Swelling and Hives    Allergy classified as swelling here, but pt says Cipro just doesn't work for him    . Nitrofurantoin Hives    Hives that lasted for weeks- he had to go to the hospital   . Sulfonamide Derivatives Hives       . Sulfamethoxazole Hives    History   Social History  . Marital Status: Married    Spouse Name: N/A    Number of Children: N/A  . Years of Education: N/A   Occupational History  . Not on file.   Social History Main Topics  .  Smoking status: Former Research scientist (life sciences)  . Smokeless tobacco: Not on file  . Alcohol Use: No  . Drug Use: No  . Sexual Activity: Not on file   Other Topics Concern  . Not on file   Social History Narrative  . No narrative on file    Family History  Problem Relation Age of Onset  . Heart disease Mother     Review of Systems:  As stated in the HPI and otherwise negative.   BP 95/52  Pulse 61  Ht 5\' 5"  (1.651 m)  Wt 159 lb (72.122 kg)  BMI 26.46 kg/m2  Physical Examination: General: Well developed, well nourished, NAD HEENT: OP clear, mucus membranes moist SKIN: warm, dry. No rashes. Neuro: No focal deficits Musculoskeletal: Muscle strength 5/5 all ext Psychiatric: Mood and affect normal Neck: No JVD, no carotid bruits, no thyromegaly, no lymphadenopathy. Lungs:Clear bilaterally, no wheezes, rhonci, crackles Cardiovascular: Regular rate and rhythm. No murmurs, gallops or rubs. Abdomen:Soft. Bowel sounds present. Non-tender.  Extremities: No lower extremity edema. Pulses are 2 + in the bilateral DP/PT.  Assessment and Plan:   1. CORONARY ARTERY DISEASE: Stable. Low risk stress test May 2013.  Continue statin, ASA, Ace-inh, Imdur. No beta blocker secondary to bradycardia. Will stop Ace-inh secondary to hypotension.   2. Hypotension: Will stop Lisinopril and see if his hypotension resolves. Could be causing weakness.   3. Hyperlipidemia: Continue statin. Check lipids today.

## 2013-02-06 ENCOUNTER — Telehealth: Payer: Self-pay | Admitting: Cardiovascular Disease

## 2013-02-06 NOTE — Telephone Encounter (Signed)
Spoke with pt and reviewed lipid profile results with him

## 2013-02-06 NOTE — Telephone Encounter (Signed)
Follow up    Pt returning your call for his results.

## 2013-02-08 DIAGNOSIS — N318 Other neuromuscular dysfunction of bladder: Secondary | ICD-10-CM | POA: Diagnosis not present

## 2013-02-08 DIAGNOSIS — R350 Frequency of micturition: Secondary | ICD-10-CM | POA: Diagnosis not present

## 2013-02-09 ENCOUNTER — Emergency Department (HOSPITAL_COMMUNITY)
Admission: EM | Admit: 2013-02-09 | Discharge: 2013-02-09 | Disposition: A | Discharge status: Home / Self Care | Payer: Medicare Other | Attending: Emergency Medicine | Admitting: Emergency Medicine

## 2013-02-09 ENCOUNTER — Encounter (HOSPITAL_COMMUNITY): Payer: Self-pay | Admitting: Emergency Medicine

## 2013-02-09 DIAGNOSIS — E785 Hyperlipidemia, unspecified: Secondary | ICD-10-CM | POA: Insufficient documentation

## 2013-02-09 DIAGNOSIS — Z951 Presence of aortocoronary bypass graft: Secondary | ICD-10-CM | POA: Insufficient documentation

## 2013-02-09 DIAGNOSIS — Z7982 Long term (current) use of aspirin: Secondary | ICD-10-CM | POA: Diagnosis not present

## 2013-02-09 DIAGNOSIS — I2510 Atherosclerotic heart disease of native coronary artery without angina pectoris: Secondary | ICD-10-CM | POA: Diagnosis not present

## 2013-02-09 DIAGNOSIS — R339 Retention of urine, unspecified: Secondary | ICD-10-CM | POA: Diagnosis not present

## 2013-02-09 DIAGNOSIS — R300 Dysuria: Secondary | ICD-10-CM | POA: Diagnosis not present

## 2013-02-09 DIAGNOSIS — Z87891 Personal history of nicotine dependence: Secondary | ICD-10-CM | POA: Diagnosis not present

## 2013-02-09 DIAGNOSIS — I252 Old myocardial infarction: Secondary | ICD-10-CM | POA: Insufficient documentation

## 2013-02-09 DIAGNOSIS — Z87442 Personal history of urinary calculi: Secondary | ICD-10-CM | POA: Diagnosis not present

## 2013-02-09 DIAGNOSIS — F0390 Unspecified dementia without behavioral disturbance: Secondary | ICD-10-CM | POA: Diagnosis not present

## 2013-02-09 DIAGNOSIS — Z8546 Personal history of malignant neoplasm of prostate: Secondary | ICD-10-CM | POA: Insufficient documentation

## 2013-02-09 DIAGNOSIS — Z79899 Other long term (current) drug therapy: Secondary | ICD-10-CM | POA: Diagnosis not present

## 2013-02-09 LAB — CBC WITH DIFFERENTIAL/PLATELET
Basophils Absolute: 0 10*3/uL (ref 0.0–0.1)
Basophils Relative: 0 % (ref 0–1)
Eosinophils Absolute: 0.1 10*3/uL (ref 0.0–0.7)
Eosinophils Relative: 1 % (ref 0–5)
HCT: 43.4 % (ref 39.0–52.0)
Hemoglobin: 14.8 g/dL (ref 13.0–17.0)
Lymphocytes Relative: 6 % — ABNORMAL LOW (ref 12–46)
Lymphs Abs: 0.5 10*3/uL — ABNORMAL LOW (ref 0.7–4.0)
MCH: 31 pg (ref 26.0–34.0)
MCHC: 34.1 g/dL (ref 30.0–36.0)
MCV: 90.8 fL (ref 78.0–100.0)
Monocytes Absolute: 1 10*3/uL (ref 0.1–1.0)
Monocytes Relative: 12 % (ref 3–12)
Neutro Abs: 6.4 10*3/uL (ref 1.7–7.7)
Neutrophils Relative %: 81 % — ABNORMAL HIGH (ref 43–77)
Platelets: 109 10*3/uL — ABNORMAL LOW (ref 150–400)
RBC: 4.78 MIL/uL (ref 4.22–5.81)
RDW: 13.2 % (ref 11.5–15.5)
WBC: 7.9 10*3/uL (ref 4.0–10.5)

## 2013-02-09 LAB — BASIC METABOLIC PANEL
BUN: 22 mg/dL (ref 6–23)
CO2: 24 mEq/L (ref 19–32)
Calcium: 9.5 mg/dL (ref 8.4–10.5)
Chloride: 105 mEq/L (ref 96–112)
Creatinine, Ser: 1.01 mg/dL (ref 0.50–1.35)
GFR calc Af Amer: 77 mL/min — ABNORMAL LOW (ref >90)
GFR calc non Af Amer: 67 mL/min — ABNORMAL LOW (ref >90)
Glucose, Bld: 113 mg/dL — ABNORMAL HIGH (ref 70–99)
Potassium: 4.4 mEq/L (ref 3.5–5.1)
Sodium: 139 mEq/L (ref 135–145)

## 2013-02-09 MED ORDER — MORPHINE SULFATE 4 MG/ML IJ SOLN
4.0000 mg | Freq: Once | INTRAMUSCULAR | Status: AC
  Administered 2013-02-09: 4 mg via INTRAVENOUS
  Filled 2013-02-09: qty 1

## 2013-02-09 MED ORDER — GENTAMICIN SULFATE 40 MG/ML IJ SOLN
160.0000 mg | Freq: Once | INTRAMUSCULAR | Status: AC
  Administered 2013-02-09: 160 mg via INTRAMUSCULAR
  Filled 2013-02-09: qty 4

## 2013-02-09 MED ORDER — LIDOCAINE HCL 2 % EX GEL
Freq: Once | CUTANEOUS | Status: AC
  Administered 2013-02-09: 11:00:00 via URETHRAL
  Filled 2013-02-09: qty 10

## 2013-02-09 NOTE — Consult Note (Signed)
Urology Consult  CC: Urinary retention  Consulting M.D.:Yao  HPI: 79 year old male with a history of radiotherapy of the prostate, with recent urodynamics performed in the office yesterday. The patient was sent home without a catheter, and initially voided well. However, throughout the night he has been having a difficult time urinating, and he called this morning through the answering service. It was recommended he come to the emergency room here at [facility] long the hospital for evaluation and probable catheter placement. Catheter attempt x2, including a 14 Foley and a 20 [place] coud were unsuccessful. I was then consulted.  The patient is uncomfortable and is urinating some in small amounts. He has had no fever or chills. He took an antibiotic this morning for prophylaxis before and after the urodynamics. PMH: Past Medical History  Diagnosis Date  . Coronary artery disease     11/89 CABG  (70% circ; 90% PD; 60-70% distal left main; 30% left circ; 90% 1st diag;, 2nd diag and 3rd diag. w/90% mild LAD stenosis and 60-70% prox. stenosisin the LAD)  . Kidney stone     1970  . S/P T&A (status post tonsillectomy and adenoidectomy)     1954  . Cancer     prostate cancer and seed implant 1998  . Hyperlipidemia   . Dementia     mild  . Normal nuclear stress test July 2011  . Myocardial infarction     PSH: Past Surgical History  Procedure Laterality Date  . Coronary artery bypass graft      11/89 CABG  (70% circ; 90% PD; 60-70% distal left main; 30% left circ; 90% 1st diag;, 2nd diag and 3rd diag. w/90% mild LAD stenosis and 60-70% prox. stenosisin the LAD. Redo bypass in 1997  . Tonsillectomy    . Cholecystectomy    . Eus  10/05/2011    Procedure: ESOPHAGEAL ENDOSCOPIC ULTRASOUND (EUS) RADIAL;  Surgeon: Arta Silence, MD;  Location: [facility];  Service: Endoscopy;  Laterality: N/A;    Allergies: Allergies  Allergen Reactions  . Ciprofloxacin Swelling and Hives    Allergy  classified as swelling here, but pt says Cipro just doesn't work for him    . Nitrofurantoin Hives    Hives that lasted for weeks- he had to go to the hospital   . Sulfonamide Derivatives Hives       . Sulfamethoxazole Hives    Medications:  (Not in a hospital admission)   Social History: History   Social History  . Marital Status: Married    Spouse Name: N/A    Number of Children: N/A  . Years of Education: N/A   Occupational History  . Not on file.   Social History Main Topics  . Smoking status: Former Research scientist (life sciences)  . Smokeless tobacco: Not on file  . Alcohol Use: No  . Drug Use: No  . Sexual Activity: Not on file   Other Topics Concern  . Not on file   Social History Narrative  . No narrative on file    Family History: Family History  Problem Relation Age of Onset  . Heart disease Mother     Review of Systems: Positive: Dysuria, frequency, inability to empty the bladder Negative:   A further 10 point review of systems was negative except what is listed in the HPI.  Physical Exam: @VITALS2 @ General: No acute distress.  Awake. Head:  Normocephalic.  Atraumatic. ENT:  EOMI.  Mucous membranes moist Abdomen: Soft.  Non- tender to palpation. Bladder  not palpable Skin:  Normal turgor.  No visible rash. Extremity: No gross deformity of bilateral upper extremities.  No gross deformity of    bilateral lower extremities. Neurologic: Alert. Appropriate mood.  Penis:  Uncircumcised.  No lesions. He was leaking urine Urethra:   Orthotopic meatus. Scrotum: No lesions.  No ecchymosis.  No erythema. Testicles: Descended bilaterally.  No masses bilaterally. They were atrophic   Studies:  Recent Labs     02/09/13  1050  HGB  14.8  WBC  7.9  PLT  109*    Recent Labs     02/09/13  1050  NA  139  K  4.4  CL  105  CO2  24  BUN  22  CREATININE  1.01  CALCIUM  9.5  GFRNONAA  67*  GFRAA  77*     No results found for this basename: PT, INR, APTT,  in the  last 72 hours   No components found with this basename: ABG,   Following sterile prep and drape, and placing 15 cc of 2% viscous lidocaine the urethra, I negotiated an 30 French coud-tip catheter into the bladder. The catheter balloon was filled with 10 cc of water and hooked to dependent drainage. He tolerated the procedure well  Assessment:  Bladder neck obstruction, with catheter in place now  Plan: He will be given IM antibiotics here in the emergency room.  I requested that he call Dr. Risa Grill on Monday to set up followup.    [phone]

## 2013-02-09 NOTE — ED Notes (Signed)
Pt escorted to discharge window. Pt verbalized understanding discharge instructions. In no acute distress.  

## 2013-02-09 NOTE — ED Provider Notes (Signed)
CSN: [id number]     Arrival date & time 02/09/13  [id number] History   First MD Initiated Contact with Patient 02/09/13 1010     Chief Complaint  Patient presents with  . urinary issue, sent from [facility]    (Consider location/radiation/quality/duration/timing/severity/associated sxs/prior Treatment) The history is provided by the patient.  Ronald [name] is a 79 y.o. male history of CAD, kidney stone, prostate cancer status post radiation seed implant here presenting with unable to urinate. He has some difficulty with urination for several months. He often gets up at night to urinate. Also has pain with urination. Went to [facility] u[facility] and had a urodynamic study done yesterday. States that they had trouble placing a Foley during the study. Wasn't able to void after the study.   Past Medical History  Diagnosis Date  . Coronary artery disease     11/89 CABG  (70% circ; 90% PD; 60-70% distal left main; 30% left circ; 90% 1st diag;, 2nd diag and 3rd diag. w/90% mild LAD stenosis and 60-70% prox. stenosisin the LAD)  . Kidney stone     1970  . S/P T&A (status post tonsillectomy and adenoidectomy)     1954  . Cancer     prostate cancer and seed implant 1998  . Hyperlipidemia   . Dementia     mild  . Normal nuclear stress test July 2011  . Myocardial infarction    Past Surgical History  Procedure Laterality Date  . Coronary artery bypass graft      11/89 CABG  (70% circ; 90% PD; 60-70% distal left main; 30% left circ; 90% 1st diag;, 2nd diag and 3rd diag. w/90% mild LAD stenosis and 60-70% prox. stenosisin the LAD. Redo bypass in 1997  . Tonsillectomy    . Cholecystectomy    . Eus  10/05/2011    Procedure: ESOPHAGEAL ENDOSCOPIC ULTRASOUND (EUS) RADIAL;  Surgeon: Arta Silence, MD;  Location: [facility];  Service: Endoscopy;  Laterality: N/A;   Family History  Problem Relation Age of Onset  . Heart disease Mother    History  Substance Use Topics  . Smoking status: Former Research scientist (life sciences)  .  Smokeless tobacco: Not on file  . Alcohol Use: No    Review of Systems  Genitourinary: Positive for dysuria and difficulty urinating.  All other systems reviewed and are negative.    Allergies  Ciprofloxacin; Nitrofurantoin; Sulfonamide derivatives; and Sulfamethoxazole  Home Medications   Current Outpatient Rx  Name  Route  Sig  Dispense  Refill  . acetaminophen (TYLENOL) 500 MG tablet   Oral   Take 500 mg by mouth every 6 (six) hours as needed for pain or fever. Take 1 -2 pills         . aspirin EC 325 MG tablet   Oral   Take 325 mg by mouth daily.         Marland Kitchen atorvastatin (LIPITOR) 40 MG tablet   Oral   Take 40 mg by mouth daily.         . Calcium Carbonate-Vitamin D (CALCIUM + D PO)   Oral   Take 1 tablet by mouth 2 (two) times daily.          Marland Kitchen galantamine (RAZADYNE ER) 8 MG 24 hr capsule   Oral   Take 8 mg by mouth daily with breakfast.         . hydroxypropyl methylcellulose (ISOPTO TEARS) 2.5 % ophthalmic solution   Both Eyes   Place 1 drop into  both eyes daily.         . isosorbide mononitrate (IMDUR) 30 MG 24 hr tablet   Oral   Take 30 mg by mouth daily.           . Meth-Hyo-M Bl-Na Phos-Ph Sal (URIBEL) 118 MG CAPS   Oral   Take by mouth as needed.         . Multiple Vitamin (MULTIVITAMIN WITH MINERALS) TABS   Oral   Take 1 tablet by mouth daily.         . nitroGLYCERIN (NITROSTAT) 0.4 MG SL tablet   Sublingual   Place 0.4 mg under the tongue every 5 (five) minutes as needed for chest pain.           BP 150/61  Pulse 58  Temp(Src) 97.6 F (36.4 C) (Oral)  Resp 16  SpO2 95% Physical Exam  Nursing note and vitals reviewed. Constitutional: He is oriented to person, place, and time. He appears well-developed.  Uncomfortable   HENT:  Head: Normocephalic.  Mouth/Throat: Oropharynx is clear and moist.  Eyes: Conjunctivae and EOM are normal. Pupils are equal, round, and reactive to light.  Neck: Normal range of motion.   Cardiovascular: Normal rate, regular rhythm and normal heart sounds.   Pulmonary/Chest: Effort normal and breath sounds normal. No respiratory distress. He has no wheezes. He has no rales.  Abdominal: Soft.  Bladder distended and enlarged and tender. No CVAT   Musculoskeletal: Normal range of motion.  Neurological: He is alert and oriented to person, place, and time.  Skin: Skin is warm and dry.  Psychiatric: He has a normal mood and affect. His behavior is normal. Judgment and thought content normal.    ED Course  Procedures (including critical care time)  Foley placement I cleaned the tip of penis with betadine. I put 2% lido jelly into urethra. I attempted to place 2 F coude with no success.   Labs Review Labs Reviewed  CBC WITH DIFFERENTIAL - Abnormal; Notable for the following:    Platelets 109 (*)    Neutrophils Relative % 81 (*)    Lymphocytes Relative 6 (*)    Lymphs Abs 0.5 (*)    All other components within normal limits  BASIC METABOLIC PANEL - Abnormal; Notable for the following:    Glucose, Bld 113 (*)    GFR calc non Af Amer 67 (*)    GFR calc Af Amer 77 (*)    All other components within normal limits   Imaging Review No results found.  EKG Interpretation   None       MDM  No diagnosis found. Ronald [name] is a 79 y.o. male here with urinary retention. Bladder scan done by nursing showed bladder volume of 440 ml. I think he likely has urethral strictures from previous radiation seeds. Will check kidney function. Will attempt foley.   11:20 AM The tech attempted 14 gauge foley, no success. I attempted to place a 41 F coude with no success.   1:38 PM Dr. [facility] called, placed foley. Gave patient gentamicin in the ED. He still has amoxicillin from yesterday and will see urologist next week.    Wandra Arthurs, MD 02/09/13 [phone]

## 2013-02-09 NOTE — ED Notes (Addendum)
Pt reports hx of prostate cancer, pt has been having problems with his bladder, 10-12x per night and pain with urination. Pt was seen at [facility] for Urodynamic procedure yesterday. Last night pt started not being able to void. Pt last void 0300. Pain 10/10.   With bladder scanner 433ml in bladder

## 2013-02-09 NOTE — ED Notes (Signed)
Urology cart at bedside 

## 2013-02-09 NOTE — ED Notes (Signed)
Attempted foley unsuccessful dr Darl Householder made aware pt given urinal to try to obtain UA if pt can void

## 2013-02-09 NOTE — ED Notes (Signed)
Urologist at bedside.

## 2013-02-12 DIAGNOSIS — C61 Malignant neoplasm of prostate: Secondary | ICD-10-CM | POA: Diagnosis not present

## 2013-02-14 DIAGNOSIS — N3020 Other chronic cystitis without hematuria: Secondary | ICD-10-CM | POA: Diagnosis not present

## 2013-02-14 DIAGNOSIS — N318 Other neuromuscular dysfunction of bladder: Secondary | ICD-10-CM | POA: Diagnosis not present

## 2013-02-14 DIAGNOSIS — C61 Malignant neoplasm of prostate: Secondary | ICD-10-CM | POA: Diagnosis not present

## 2013-03-01 DIAGNOSIS — N3020 Other chronic cystitis without hematuria: Secondary | ICD-10-CM | POA: Diagnosis not present

## 2013-03-01 DIAGNOSIS — N318 Other neuromuscular dysfunction of bladder: Secondary | ICD-10-CM | POA: Diagnosis not present

## 2013-03-01 DIAGNOSIS — R350 Frequency of micturition: Secondary | ICD-10-CM | POA: Diagnosis not present

## 2013-03-13 DIAGNOSIS — N318 Other neuromuscular dysfunction of bladder: Secondary | ICD-10-CM | POA: Diagnosis not present

## 2013-03-13 DIAGNOSIS — N3020 Other chronic cystitis without hematuria: Secondary | ICD-10-CM | POA: Diagnosis not present

## 2013-03-19 DIAGNOSIS — N3020 Other chronic cystitis without hematuria: Secondary | ICD-10-CM | POA: Diagnosis not present

## 2013-04-16 DIAGNOSIS — R350 Frequency of micturition: Secondary | ICD-10-CM | POA: Diagnosis not present

## 2013-04-16 DIAGNOSIS — N3020 Other chronic cystitis without hematuria: Secondary | ICD-10-CM | POA: Diagnosis not present

## 2013-07-12 ENCOUNTER — Telehealth: Payer: Self-pay | Admitting: Internal Medicine

## 2013-07-12 NOTE — Telephone Encounter (Signed)
Noted  

## 2013-07-12 NOTE — Telephone Encounter (Signed)
Pt needs a Physical has not had one with Dr. Raliegh Ip in a long time. Please schedule.

## 2013-07-12 NOTE — Telephone Encounter (Signed)
Pt received automated call to make an appt at our office.  Do you want me to sch a cpe, or just a fup?  Pt states he sees the urologist reguarly for an issue. othewise he is doing fine. Pls advise

## 2013-07-12 NOTE — Telephone Encounter (Signed)
Pt scheduled for fasting cpe om 09/17/13.

## 2013-07-31 ENCOUNTER — Other Ambulatory Visit: Payer: Self-pay | Admitting: Physician Assistant

## 2013-07-31 DIAGNOSIS — L570 Actinic keratosis: Secondary | ICD-10-CM | POA: Diagnosis not present

## 2013-07-31 DIAGNOSIS — D0439 Carcinoma in situ of skin of other parts of face: Secondary | ICD-10-CM | POA: Diagnosis not present

## 2013-07-31 DIAGNOSIS — D0430 Carcinoma in situ of skin of unspecified part of face: Secondary | ICD-10-CM | POA: Diagnosis not present

## 2013-08-05 ENCOUNTER — Ambulatory Visit (INDEPENDENT_AMBULATORY_CARE_PROVIDER_SITE_OTHER): Payer: Medicare Other | Admitting: Cardiovascular Disease

## 2013-08-05 ENCOUNTER — Encounter: Payer: Self-pay | Admitting: Cardiovascular Disease

## 2013-08-05 VITALS — BP 106/64 | HR 50 | Ht 65.0 in | Wt 162.0 lb

## 2013-08-05 DIAGNOSIS — E785 Hyperlipidemia, unspecified: Secondary | ICD-10-CM | POA: Diagnosis not present

## 2013-08-05 DIAGNOSIS — I2510 Atherosclerotic heart disease of native coronary artery without angina pectoris: Secondary | ICD-10-CM

## 2013-08-05 NOTE — Progress Notes (Signed)
History of Present Illness: 80 yo male with history of CAD s/p CABG in 1989 and redo bypass in 1997 per Dr. Redmond Pulling, nephrolithiasis, HTN, HLD, mild dementia, prostate cancer here today for cardiac follow up. He has been followed in the past by Dr. Doreatha Lew. Leane Call May 2013 because of dyspnea and fatigue. This showed inferior scar with soft tissue attenuation and small region of anterior scar with possible small area of ischemia. This was felt to be low risk. He has been managed conservatively. He was seen in November 2014 and c/o fatigue and was hypotensive. Lisinopril was stopped.   He is here today for follow up. He has no exertional chest pain or pressure. He has been working in the yard with no chest pain. No dizziness. He continues to have issues with UTI/bladder spasm and has been followed in [facility].   Primary Care Physician: Dr. Burnice Logan  Last Lipid Profile:Lipid Panel     Component Value Date/Time   CHOL 130 02/05/2013 1057   TRIG 58.0 02/05/2013 1057   HDL 46.20 02/05/2013 1057   CHOLHDL 3 02/05/2013 1057   VLDL 11.6 02/05/2013 1057   LDLCALC 72 02/05/2013 1057    Past Medical History  Diagnosis Date  . Coronary artery disease     11/89 CABG  (70% circ; 90% PD; 60-70% distal left main; 30% left circ; 90% 1st diag;, 2nd diag and 3rd diag. w/90% mild LAD stenosis and 60-70% prox. stenosisin the LAD)  . Kidney stone     1970  . S/P T&A (status post tonsillectomy and adenoidectomy)     1954  . Cancer     prostate cancer and seed implant 1998  . Hyperlipidemia   . Dementia     mild  . Normal nuclear stress test July 2011  . Myocardial infarction     Past Surgical History  Procedure Laterality Date  . Coronary artery bypass graft      11/89 CABG  (70% circ; 90% PD; 60-70% distal left main; 30% left circ; 90% 1st diag;, 2nd diag and 3rd diag. w/90% mild LAD stenosis and 60-70% prox. stenosisin the LAD. Redo bypass in 1997  . Tonsillectomy    .  Cholecystectomy    . Eus  10/05/2011    Procedure: ESOPHAGEAL ENDOSCOPIC ULTRASOUND (EUS) RADIAL;  Surgeon: Arta Silence, MD;  Location: [facility];  Service: Endoscopy;  Laterality: N/A;    Current Outpatient Prescriptions  Medication Sig Dispense Refill  . acetaminophen (TYLENOL) 500 MG tablet Take 500 mg by mouth every 6 (six) hours as needed for pain or fever. Take 1 -2 pills      . aspirin EC 325 MG tablet Take 325 mg by mouth daily.      Marland Kitchen atorvastatin (LIPITOR) 40 MG tablet Take 40 mg by mouth daily.      . Calcium Carbonate-Vitamin D (CALCIUM + D PO) Take 1 tablet by mouth 2 (two) times daily.       . Cholecalciferol (VITAMIN D) 2000 UNITS tablet Take 2,000 Units by mouth daily.      Marland Kitchen galantamine (RAZADYNE ER) 8 MG 24 hr capsule Take 8 mg by mouth daily with breakfast.      . hydroxypropyl methylcellulose (ISOPTO TEARS) 2.5 % ophthalmic solution Place 1 drop into both eyes daily.      . isosorbide mononitrate (IMDUR) 30 MG 24 hr tablet Take 30 mg by mouth daily.        . Meth-Hyo-M Bl-Na Phos-Ph Sal (  URIBEL) 118 MG CAPS Take by mouth as needed.      . Multiple Vitamin (MULTIVITAMIN WITH MINERALS) TABS Take 1 tablet by mouth daily.      . nitroGLYCERIN (NITROSTAT) 0.4 MG SL tablet Place 0.4 mg under the tongue every 5 (five) minutes as needed for chest pain.       . silodosin (RAPAFLO) 8 MG CAPS capsule Take 8 mg by mouth daily with breakfast.       No current facility-administered medications for this visit.    Allergies  Allergen Reactions  . Ciprofloxacin Swelling and Hives    Allergy classified as swelling here, but pt says Cipro just doesn't work for him    . Nitrofurantoin Hives    Hives that lasted for weeks- he had to go to the hospital   . Sulfonamide Derivatives Hives       . Sulfamethoxazole Hives    History   Social History  . Marital Status: Married    Spouse Name: N/A    Number of Children: N/A  . Years of Education: N/A   Occupational History  .  Not on file.   Social History Main Topics  . Smoking status: Former Research scientist (life sciences)  . Smokeless tobacco: Not on file  . Alcohol Use: No  . Drug Use: No  . Sexual Activity: Not on file   Other Topics Concern  . Not on file   Social History Narrative  . No narrative on file    Family History  Problem Relation Age of Onset  . Heart disease Mother     Review of Systems:  As stated in the HPI and otherwise negative.   BP 106/64  Pulse 50  Ht 5\' 5"  (1.651 m)  Wt 162 lb (73.483 kg)  BMI 26.96 kg/m2  Physical Examination: General: Well developed, well nourished, NAD HEENT: OP clear, mucus membranes moist SKIN: warm, dry. No rashes. Neuro: No focal deficits Musculoskeletal: Muscle strength 5/5 all ext Psychiatric: Mood and affect normal Neck: No JVD, no carotid bruits, no thyromegaly, no lymphadenopathy. Lungs:Clear bilaterally, no wheezes, rhonci, crackles Cardiovascular: Regular rate and rhythm. No murmurs, gallops or rubs. Abdomen:Soft. Bowel sounds present. Non-tender.  Extremities: No lower extremity edema. Pulses are 2 + in the bilateral DP/PT.  EKG: sinus, rate 50 bpm.   Assessment and Plan:   1. CORONARY ARTERY DISEASE: Stable. Low risk stress test May 2013.  Continue statin, ASA, Imdur. No beta blocker secondary to bradycardia. No Ace-inh secondary to hypotension.   2. Hypotension: Resolved off of Lisinopril  3. Hyperlipidemia: Continue statin. Lipids well controlled.

## 2013-08-05 NOTE — Patient Instructions (Signed)
Your physician wants you to follow-up in:  6 months. You will receive a reminder letter in the mail two months in advance. If you don't receive a letter, please call our office to schedule the follow-up appointment.   

## 2013-08-26 DIAGNOSIS — R350 Frequency of micturition: Secondary | ICD-10-CM | POA: Diagnosis not present

## 2013-08-26 DIAGNOSIS — N3020 Other chronic cystitis without hematuria: Secondary | ICD-10-CM | POA: Diagnosis not present

## 2013-08-26 DIAGNOSIS — N318 Other neuromuscular dysfunction of bladder: Secondary | ICD-10-CM | POA: Diagnosis not present

## 2013-08-27 ENCOUNTER — Encounter (HOSPITAL_COMMUNITY): Payer: Self-pay | Admitting: Emergency Medicine

## 2013-08-27 ENCOUNTER — Emergency Department (HOSPITAL_COMMUNITY): Payer: Medicare Other

## 2013-08-27 ENCOUNTER — Inpatient Hospital Stay (HOSPITAL_COMMUNITY)
Admission: EM | Admit: 2013-08-27 | Discharge: 2013-08-29 | DRG: 287 | Disposition: A | Discharge status: Home / Self Care | Payer: Medicare Other | Attending: Interventional Cardiology | Admitting: Interventional Cardiology

## 2013-08-27 ENCOUNTER — Telehealth: Payer: Self-pay | Admitting: Cardiovascular Disease

## 2013-08-27 DIAGNOSIS — I25810 Atherosclerosis of coronary artery bypass graft(s) without angina pectoris: Secondary | ICD-10-CM | POA: Diagnosis not present

## 2013-08-27 DIAGNOSIS — Z888 Allergy status to other drugs, medicaments and biological substances status: Secondary | ICD-10-CM

## 2013-08-27 DIAGNOSIS — E785 Hyperlipidemia, unspecified: Secondary | ICD-10-CM | POA: Diagnosis present

## 2013-08-27 DIAGNOSIS — F0390 Unspecified dementia without behavioral disturbance: Secondary | ICD-10-CM | POA: Diagnosis not present

## 2013-08-27 DIAGNOSIS — Z87891 Personal history of nicotine dependence: Secondary | ICD-10-CM | POA: Diagnosis not present

## 2013-08-27 DIAGNOSIS — I498 Other specified cardiac arrhythmias: Secondary | ICD-10-CM | POA: Diagnosis present

## 2013-08-27 DIAGNOSIS — R079 Chest pain, unspecified: Secondary | ICD-10-CM

## 2013-08-27 DIAGNOSIS — I10 Essential (primary) hypertension: Secondary | ICD-10-CM | POA: Diagnosis present

## 2013-08-27 DIAGNOSIS — I2510 Atherosclerotic heart disease of native coronary artery without angina pectoris: Secondary | ICD-10-CM | POA: Diagnosis not present

## 2013-08-27 DIAGNOSIS — I200 Unstable angina: Secondary | ICD-10-CM | POA: Diagnosis not present

## 2013-08-27 DIAGNOSIS — R071 Chest pain on breathing: Secondary | ICD-10-CM | POA: Diagnosis not present

## 2013-08-27 DIAGNOSIS — I440 Atrioventricular block, first degree: Secondary | ICD-10-CM | POA: Diagnosis present

## 2013-08-27 DIAGNOSIS — E669 Obesity, unspecified: Secondary | ICD-10-CM

## 2013-08-27 DIAGNOSIS — I2582 Chronic total occlusion of coronary artery: Secondary | ICD-10-CM | POA: Diagnosis present

## 2013-08-27 DIAGNOSIS — I252 Old myocardial infarction: Secondary | ICD-10-CM

## 2013-08-27 DIAGNOSIS — E7800 Pure hypercholesterolemia, unspecified: Secondary | ICD-10-CM | POA: Diagnosis not present

## 2013-08-27 DIAGNOSIS — R001 Bradycardia, unspecified: Secondary | ICD-10-CM

## 2013-08-27 LAB — CBC
HCT: 40.7 % (ref 39.0–52.0)
Hemoglobin: 13.6 g/dL (ref 13.0–17.0)
MCH: 30.6 pg (ref 26.0–34.0)
MCHC: 33.4 g/dL (ref 30.0–36.0)
MCV: 91.7 fL (ref 78.0–100.0)
Platelets: 108 10*3/uL — ABNORMAL LOW (ref 150–400)
RBC: 4.44 MIL/uL (ref 4.22–5.81)
RDW: 14.1 % (ref 11.5–15.5)
WBC: 6.7 10*3/uL (ref 4.0–10.5)

## 2013-08-27 LAB — LIPASE, BLOOD: Lipase: 39 U/L (ref 11–59)

## 2013-08-27 LAB — BASIC METABOLIC PANEL
BUN: 25 mg/dL — ABNORMAL HIGH (ref 6–23)
CO2: 28 mEq/L (ref 19–32)
Calcium: 10.4 mg/dL (ref 8.4–10.5)
Chloride: 104 mEq/L (ref 96–112)
Creatinine, Ser: 1.02 mg/dL (ref 0.50–1.35)
GFR calc Af Amer: 76 mL/min — ABNORMAL LOW (ref >90)
GFR calc non Af Amer: 65 mL/min — ABNORMAL LOW (ref >90)
Glucose, Bld: 106 mg/dL — ABNORMAL HIGH (ref 70–99)
Potassium: 4.8 mEq/L (ref 3.7–5.3)
Sodium: 142 mEq/L (ref 137–147)

## 2013-08-27 LAB — TROPONIN I
Troponin I: <0.3 ng/mL (ref <0.30)
Troponin I: <0.3 ng/mL (ref <0.30)

## 2013-08-27 LAB — HEMOGLOBIN A1C
Hgb A1c MFr Bld: 6 % — ABNORMAL HIGH (ref <5.7)
Mean Plasma Glucose: 126 mg/dL — ABNORMAL HIGH (ref <117)

## 2013-08-27 LAB — PROTIME-INR
INR: 1.24 (ref 0.00–1.49)
Prothrombin Time: 15.3 seconds — ABNORMAL HIGH (ref 11.6–15.2)

## 2013-08-27 LAB — TSH: TSH: 5.41 u[IU]/mL — ABNORMAL HIGH (ref 0.350–4.500)

## 2013-08-27 LAB — T4, FREE: Free T4: 1.24 ng/dL (ref 0.80–1.80)

## 2013-08-27 LAB — I-STAT TROPONIN, ED: Troponin i, poc: 0.01 ng/mL (ref 0.00–0.08)

## 2013-08-27 LAB — MAGNESIUM: Magnesium: 2.1 mg/dL (ref 1.5–2.5)

## 2013-08-27 LAB — APTT: aPTT: 179 seconds — ABNORMAL HIGH (ref 24–37)

## 2013-08-27 MED ORDER — SODIUM CHLORIDE 0.9 % IV SOLN
INTRAVENOUS | Status: DC
  Administered 2013-08-27: 16:00:00 via INTRAVENOUS

## 2013-08-27 MED ORDER — GI COCKTAIL ~~LOC~~
30.0000 mL | Freq: Once | ORAL | Status: AC
  Administered 2013-08-27: 30 mL via ORAL
  Filled 2013-08-27: qty 30

## 2013-08-27 MED ORDER — SODIUM CHLORIDE 0.9 % IV SOLN: 250.0000 mL | INTRAVENOUS | Status: DC | PRN

## 2013-08-27 MED ORDER — ALPRAZOLAM 0.25 MG PO TABS
0.2500 mg | ORAL_TABLET | Freq: Two times a day (BID) | ORAL | Status: DC | PRN
  Administered 2013-08-27: 0.25 mg via ORAL
  Filled 2013-08-27: qty 1

## 2013-08-27 MED ORDER — ISOSORBIDE MONONITRATE ER 60 MG PO TB24
60.0000 mg | ORAL_TABLET | Freq: Every day | ORAL | Status: DC
  Administered 2013-08-28: 60 mg via ORAL
  Filled 2013-08-27 (×2): qty 1

## 2013-08-27 MED ORDER — NITROGLYCERIN 0.4 MG SL SUBL: 0.4000 mg | SUBLINGUAL_TABLET | SUBLINGUAL | Status: DC | PRN

## 2013-08-27 MED ORDER — SODIUM CHLORIDE 0.9 % IJ SOLN: 3.0000 mL | INTRAMUSCULAR | Status: DC | PRN

## 2013-08-27 MED ORDER — ONDANSETRON HCL 4 MG/2ML IJ SOLN: 4.0000 mg | Freq: Four times a day (QID) | INTRAMUSCULAR | Status: DC | PRN

## 2013-08-27 MED ORDER — HEPARIN (PORCINE) IN NACL 100-0.45 UNIT/ML-% IJ SOLN
850.0000 [IU]/h | INTRAMUSCULAR | Status: DC
  Administered 2013-08-27 (×2): 850 [IU]/h via INTRAVENOUS
  Filled 2013-08-27 (×2): qty 250

## 2013-08-27 MED ORDER — MORPHINE SULFATE 2 MG/ML IJ SOLN
2.0000 mg | INTRAMUSCULAR | Status: DC | PRN
  Administered 2013-08-27 (×2): 2 mg via INTRAVENOUS
  Filled 2013-08-27 (×2): qty 1

## 2013-08-27 MED ORDER — ADULT MULTIVITAMIN W/MINERALS CH
1.0000 | ORAL_TABLET | Freq: Every day | ORAL | Status: DC
  Administered 2013-08-28: 1 via ORAL
  Filled 2013-08-27 (×2): qty 1

## 2013-08-27 MED ORDER — SILODOSIN 8 MG PO CAPS
8.0000 mg | ORAL_CAPSULE | Freq: Every day | ORAL | Status: DC
  Administered 2013-08-28: 8 mg via ORAL
  Filled 2013-08-27 (×3): qty 1

## 2013-08-27 MED ORDER — HYDROCODONE-ACETAMINOPHEN 5-325 MG PO TABS: 1.0000 | ORAL_TABLET | ORAL | Status: DC | PRN

## 2013-08-27 MED ORDER — ATORVASTATIN CALCIUM 40 MG PO TABS
40.0000 mg | ORAL_TABLET | Freq: Every day | ORAL | Status: DC
  Administered 2013-08-27 – 2013-08-28 (×2): 40 mg via ORAL
  Filled 2013-08-27 (×3): qty 1

## 2013-08-27 MED ORDER — HYPROMELLOSE (GONIOSCOPIC) 2.5 % OP SOLN: 1.0000 [drp] | Freq: Every day | OPHTHALMIC | Status: DC

## 2013-08-27 MED ORDER — ASPIRIN EC 81 MG PO TBEC
81.0000 mg | DELAYED_RELEASE_TABLET | Freq: Every day | ORAL | Status: DC
  Filled 2013-08-27 (×2): qty 1

## 2013-08-27 MED ORDER — SODIUM CHLORIDE 0.9 % IJ SOLN
3.0000 mL | Freq: Two times a day (BID) | INTRAMUSCULAR | Status: DC
  Administered 2013-08-27: 3 mL via INTRAVENOUS

## 2013-08-27 MED ORDER — ASPIRIN 300 MG RE SUPP: 300.0000 mg | RECTAL | Status: DC

## 2013-08-27 MED ORDER — POLYVINYL ALCOHOL 1.4 % OP SOLN
1.0000 [drp] | Freq: Every day | OPHTHALMIC | Status: DC
  Administered 2013-08-27 – 2013-08-28 (×2): 1 [drp] via OPHTHALMIC
  Filled 2013-08-27: qty 15

## 2013-08-27 MED ORDER — NON FORMULARY: 8.0000 mg | Freq: Every day | Status: DC

## 2013-08-27 MED ORDER — ASPIRIN 81 MG PO CHEW
81.0000 mg | CHEWABLE_TABLET | ORAL | Status: AC
  Administered 2013-08-28: 81 mg via ORAL
  Filled 2013-08-27: qty 1

## 2013-08-27 MED ORDER — NITROGLYCERIN 0.4 MG SL SUBL
0.4000 mg | SUBLINGUAL_TABLET | SUBLINGUAL | Status: DC | PRN
Start: 2013-08-27 — End: 2013-08-28

## 2013-08-27 MED ORDER — VITAMIN D 1000 UNITS PO TABS
2000.0000 [IU] | ORAL_TABLET | Freq: Every day | ORAL | Status: DC
  Administered 2013-08-28: 2000 [IU] via ORAL
  Filled 2013-08-27 (×2): qty 2

## 2013-08-27 MED ORDER — PANTOPRAZOLE SODIUM 40 MG PO TBEC
40.0000 mg | DELAYED_RELEASE_TABLET | Freq: Two times a day (BID) | ORAL | Status: DC
  Administered 2013-08-27 – 2013-08-29 (×4): 40 mg via ORAL
  Filled 2013-08-27 (×4): qty 1

## 2013-08-27 MED ORDER — HEPARIN BOLUS VIA INFUSION
4000.0000 [IU] | Freq: Once | INTRAVENOUS | Status: AC
  Administered 2013-08-27: 4000 [IU] via INTRAVENOUS
  Filled 2013-08-27: qty 4000

## 2013-08-27 MED ORDER — ACETAMINOPHEN 500 MG PO TABS: 500.0000 mg | ORAL_TABLET | Freq: Four times a day (QID) | ORAL | Status: DC | PRN

## 2013-08-27 MED ORDER — GALANTAMINE HYDROBROMIDE ER 8 MG PO CP24
8.0000 mg | ORAL_CAPSULE | Freq: Every day | ORAL | Status: DC
  Administered 2013-08-28 – 2013-08-29 (×2): 8 mg via ORAL
  Filled 2013-08-27 (×3): qty 1

## 2013-08-27 MED ORDER — ASPIRIN EC 325 MG PO TBEC: 325.0000 mg | DELAYED_RELEASE_TABLET | Freq: Every day | ORAL | Status: DC

## 2013-08-27 MED ORDER — ASPIRIN 81 MG PO CHEW
324.0000 mg | CHEWABLE_TABLET | ORAL | Status: DC
Start: 2013-08-27 — End: 2013-08-28

## 2013-08-27 MED ORDER — SODIUM CHLORIDE 0.9 % IV SOLN
1.0000 mL/kg/h | INTRAVENOUS | Status: DC
  Administered 2013-08-28: 1 mL/kg/h via INTRAVENOUS

## 2013-08-27 MED ORDER — ACETAMINOPHEN 325 MG PO TABS: 650.0000 mg | ORAL_TABLET | ORAL | Status: DC | PRN

## 2013-08-27 NOTE — Care Management Note (Unsigned)
    Page 1 of 1   08/27/2013     3:03:50 PM CARE MANAGEMENT NOTE 08/27/2013  Patient:  Ronald [name], Ronald [name]   Account Number:  [id number]  Date Initiated:  08/27/2013  Documentation initiated by:  GRAVES-BIGELOW,Willodean Leven  Subjective/Objective Assessment:   Pt admitted for unstable angina. Hx CAD and CABG. Pt initiated on IV heparin gtt. Plan for cath 08-28-13.     Action/Plan:   CM will continue to monitor for disposition needs.   Anticipated DC Date:  08/29/2013   Anticipated DC Plan:  [place]  CM consult      Choice offered to / List presented to:             Status of service:  In process, will continue to follow Medicare Important Message given?  YES (If response is "NO", the following Medicare IM given date fields will be blank) Date Medicare IM given:  08/27/2013 Date Additional Medicare IM given:    Discharge Disposition:    Per UR Regulation:  Reviewed for med. necessity/level of care/duration of stay  If discussed at [place] of Stay Meetings, dates discussed:    Comments:

## 2013-08-27 NOTE — ED Notes (Signed)
Introduced myself to the patient and his family.

## 2013-08-27 NOTE — H&P (Signed)
Ronald [name] is an 80 y.o. male.    Primary Cardiologist:Dr. McAlhany VQM:[id number],OIZTI FRANK, MD  Chief Complaint: chest pain  HPI: 80 yo male with history of CAD s/p CABG in 1989 and redo bypass in 1997 per Dr. Redmond Pulling, nephrolithiasis, HTN, HLD, mild dementia, prostate cancer here today for chest pain. He has been followed in the past by Dr. Doreatha Lew now Dr. Angelena Form.  Last cath 2006: 1. Mild-to-moderate left ventricular dysfunction with inferobasilar  akinesia.  2. Totally occluded native coronary circulation with diffuse disease in the  proximal left anterior descending diagonal system which potentially  could be ischemic.  3. Totally occluded saphenous vein graft to the intermediate diagonal and  saphenous vein graft to posterior descending and right coronary artery  branches.  4. Patent saphenous vein graft to the obtuse marginal with collaterals to  the distal right coronary artery and patent left internal mammary graft  to the left anterior descending and diagonal systems He was treated medically.    Last lexiscan myoview May 2013 because of dyspnea and fatigue with  inferior scar with soft tissue attenuation and small region of anterior scar with possible small area of ischemia. This was felt to be low risk. He has been managed conservatively. He was seen in November 2014 and c/o fatigue and was hypotensive. Lisinopril was stopped.   Today he presents to the ER with 2-3 day hx of waxing and waning chest pain, increasing when lying flat and improving when sitting up.  10/10 at its worst.  It does wake him from sleep.  2 NTG this AM without relief.   TUMS without relief.  He believes exercise helps the pain.  But he also states it is like the pain prior to CABG in 1997.  EKG SR with 1st degree AV block and PACs. without acute changes.  Troponin POC 0.01.  Past Medical History  Diagnosis Date  . Coronary artery disease     11/89 CABG  (70% circ; 90% PD; 60-70%  distal left main; 30% left circ; 90% 1st diag;, 2nd diag and 3rd diag. w/90% mild LAD stenosis and 60-70% prox. stenosisin the LAD)  . Kidney stone     1970  . S/P T&A (status post tonsillectomy and adenoidectomy)     1954  . Cancer     prostate cancer and seed implant 1998  . Hyperlipidemia   . Dementia     mild  . Normal nuclear stress test July 2011  . Myocardial infarction     Past Surgical History  Procedure Laterality Date  . Coronary artery bypass graft      11/89 CABG  (70% circ; 90% PD; 60-70% distal left main; 30% left circ; 90% 1st diag;, 2nd diag and 3rd diag. w/90% mild LAD stenosis and 60-70% prox. stenosisin the LAD. Redo bypass in 1997  . Tonsillectomy    . Cholecystectomy    . Eus  10/05/2011    Procedure: ESOPHAGEAL ENDOSCOPIC ULTRASOUND (EUS) RADIAL;  Surgeon: Arta Silence, MD;  Location: [facility];  Service: Endoscopy;  Laterality: N/A;    Family History  Problem Relation Age of Onset  . Heart disease Mother   . Diabetes Father   . Heart disease Brother    Social History:  reports that he has quit smoking. He uses smokeless tobacco. He reports that he does not drink alcohol or use illicit drugs.  Allergies:  Allergies  Allergen Reactions  . Ciprofloxacin Swelling and Hives  Allergy classified as swelling here, but pt says Cipro just doesn't work for him    . Nitrofurantoin Hives    Hives that lasted for weeks- he had to go to the hospital   . Sulfonamide Derivatives Hives       . Sulfamethoxazole Hives    OUT Patient Medications: ASA 325 mg daily lipitor 40 mg daily Imdur 30 mg daily Uribel 118 mg caps prn MVI dialy NTG sl prn Rapaflo 8 mg daily Razadyne Er 8 mg daily.  Results for orders placed during the hospital encounter of 08/27/13 (from the past 48 hour(s))  CBC     Status: Abnormal   Collection Time    08/27/13 11:20 AM      Result Value Ref Range   WBC 6.7  4.0 - 10.5 K/uL   RBC 4.44  4.22 - 5.81 MIL/uL   Hemoglobin 13.6   13.0 - 17.0 g/dL   HCT 40.7  39.0 - 52.0 %   MCV 91.7  78.0 - 100.0 fL   MCH 30.6  26.0 - 34.0 pg   MCHC 33.4  30.0 - 36.0 g/dL   RDW 14.1  11.5 - 15.5 %   Platelets 108 (*) 150 - 400 K/uL   Comment: PLATELET COUNT CONFIRMED BY SMEAR  BASIC METABOLIC PANEL     Status: Abnormal   Collection Time    08/27/13 11:20 AM      Result Value Ref Range   Sodium 142  137 - 147 mEq/L   Potassium 4.8  3.7 - 5.3 mEq/L   Chloride 104  96 - 112 mEq/L   CO2 28  19 - 32 mEq/L   Glucose, Bld 106 (*) 70 - 99 mg/dL   BUN 25 (*) 6 - 23 mg/dL   Creatinine, Ser 1.02  0.50 - 1.35 mg/dL   Calcium 10.4  8.4 - 10.5 mg/dL   GFR calc non Af Amer 65 (*) >90 mL/min   GFR calc Af Amer 76 (*) >90 mL/min   Comment: (NOTE)     The eGFR has been calculated using the CKD EPI equation.     This calculation has not been validated in all clinical situations.     eGFR's persistently <90 mL/min signify possible Chronic Kidney     Disease.  LIPASE, BLOOD     Status: None   Collection Time    08/27/13 11:20 AM      Result Value Ref Range   Lipase 39  11 - 59 U/L  I-STAT TROPOININ, ED     Status: None   Collection Time    08/27/13 11:29 AM      Result Value Ref Range   Troponin i, poc 0.01  0.00 - 0.08 ng/mL   Comment 3            Comment: Due to the release kinetics of cTnI,     a negative result within the first hours     of the onset of symptoms does not rule out     myocardial infarction with certainty.     If myocardial infarction is still suspected,     repeat the test at appropriate intervals.   Dg Chest Port 1 View  08/27/2013   CLINICAL DATA:  Left-sided chest pain  EXAM: PORTABLE CHEST - 1 VIEW  COMPARISON:  05/19/2012  FINDINGS: Cardiac shadow is stable with evidence of prior surgery. The lungs are well aerated bilaterally. Some mild chronic scarring is noted in the left base.  No acute bony abnormality is seen.  IMPRESSION: Chronic changes without acute abnormality.   Electronically Signed   By: [name] Catalina M.D.   On: 08/27/2013 12:07    ROS: General:no colds or fevers, no weight changes Skin:no rashes or ulcers HEENT:no blurred vision, no congestion CV:see HPI, occ skipped beats PUL:see HPI GI:no diarrhea constipation or melena, + indigestion, has trouble swallowing lying flat. GU:no hematuria, no dysuria, hx of UTIs MS:no joint pain, no claudication Neuro:no syncope, no lightheadedness Endo:no diabetes, no thyroid disease   Blood pressure 131/61, pulse 53, temperature 97.9 F (36.6 C), temperature source Oral, resp. rate 16, height 5' 5"  (1.651 m), weight 162 lb (73.483 kg), SpO2 97.00%. PE: General:Pleasant affect, NAD Skin:Warm and dry, brisk capillary refill HEENT:normocephalic, sclera clear, mucus membranes moist Neck:supple, no JVD, no bruits, no adenopathy, no thyromegaly  Heart:S1S2 RRR, occ premature beat, muffled hear sounds without murmur, gallup, rub or click Lungs:clear without rales, rhonchi, or wheezes GFR:EVQW, non tender, + BS, do not palpate liver spleen or masses Ext:+1 lower ext edema Lt > rt, 2+ pedal pulses, 2+ radial pulses Neuro:alert and oriented, MAE, follows commands, + facial symmetry    Assessment/Plan Principal Problem:   Chest pain, this could be cardiac but also has GI components.  Will admit and do serial troponins.  If positive would need cardiac cath, if Neg would do lexiscan myoview.  Add IV heparin, increase IMDUR, add PPI Active Problems:   HYPERCHOLESTEROLEMIA-check lipids   CORONARY ARTERY DISEASE, hx of CABG in 1989 and re-do in 1997, last nuc in 2013, last cath 2006   HYPERTENSION NEC controlled   Bradycardia, asymptomatic    Cecilie Kicks Nurse Practitioner Certified [facility] Pager [phone] or after 5pm or weekends call [phone] 08/27/2013, 12:46 PM

## 2013-08-27 NOTE — Progress Notes (Signed)
ANTICOAGULATION CONSULT NOTE - Initial Consult  Pharmacy Consult for Heparin Indication: chest pain/ACS  Allergies  Allergen Reactions  . Ciprofloxacin Swelling and Hives    Allergy classified as swelling here, but pt says Cipro just doesn't work for him    . Nitrofurantoin Hives    Hives that lasted for weeks- he had to go to the hospital   . Sulfonamide Derivatives Hives       . Sulfamethoxazole Hives    Patient Measurements: Height: 5\' 5"  (165.1 cm) Weight: 162 lb (73.483 kg) IBW/kg (Calculated) : 61.5 Heparin Dosing Weight: n/a  Vital Signs: Temp: 97.7 F (36.5 C) (06/02 1437) Temp src: Oral (06/02 1437) BP: 133/55 mmHg (06/02 1437) Pulse Rate: 51 (06/02 1437)  Labs:  Recent Labs  08/27/13 1120 08/27/13 1351  HGB 13.6  --   HCT 40.7  --   PLT 108*  --   CREATININE 1.02  --   TROPONINI  --  <0.30    Estimated Creatinine Clearance: 46.9 ml/min (by C-G formula based on Cr of 1.02).   Medical History: Past Medical History  Diagnosis Date  . Coronary artery disease     11/89 CABG  (70% circ; 90% PD; 60-70% distal left main; 30% left circ; 90% 1st diag;, 2nd diag and 3rd diag. w/90% mild LAD stenosis and 60-70% prox. stenosisin the LAD)  . Kidney stone     1970  . S/P T&A (status post tonsillectomy and adenoidectomy)     1954  . Cancer     prostate cancer and seed implant 1998  . Hyperlipidemia   . Dementia     mild  . Normal nuclear stress test July 2011  . Myocardial infarction     Medications:  Prescriptions prior to admission  Medication Sig Dispense Refill  . acetaminophen (TYLENOL) 500 MG tablet Take 500 mg by mouth every 6 (six) hours as needed for pain or fever. Take 1 -2 pills      . aspirin EC 325 MG tablet Take 325 mg by mouth daily.      Marland Kitchen atorvastatin (LIPITOR) 40 MG tablet Take 40 mg by mouth at bedtime.       . Calcium Carbonate-Vitamin D (CALCIUM + D PO) Take 1 tablet by mouth 2 (two) times daily.       . Cholecalciferol (VITAMIN D)  2000 UNITS tablet Take 2,000 Units by mouth daily.      Marland Kitchen galantamine (RAZADYNE ER) 8 MG 24 hr capsule Take 8 mg by mouth daily with breakfast.      . hydroxypropyl methylcellulose (ISOPTO TEARS) 2.5 % ophthalmic solution Place 1 drop into both eyes daily.      . isosorbide mononitrate (IMDUR) 30 MG 24 hr tablet Take 30 mg by mouth daily.        . Multiple Vitamin (MULTIVITAMIN WITH MINERALS) TABS Take 1 tablet by mouth daily.      . nitroGLYCERIN (NITROSTAT) 0.4 MG SL tablet Place 0.4 mg under the tongue every 5 (five) minutes as needed for chest pain.       . silodosin (RAPAFLO) 8 MG CAPS capsule Take 8 mg by mouth daily with breakfast.        Assessment: 29 YOM presents to the ED with intermittent chest pain for past 3-4 days. Pharmacy consulted to start heparin for ACS. Initial troponin negative. H/H wnl. Pt is thrombocytopenic with plt count of 108. This seems to be his baseline.   Goal of Therapy:  Heparin level 0.3-0.7  units/ml Monitor platelets by anticoagulation protocol: Yes   Plan:  Give 4000 units bolus x 1 Start heparin infusion at 850 units/hr Check anti-Xa level in 8 hours and daily while on heparin Continue to monitor H&H and platelets  Albertina Parr, PharmD.  Clinical Pharmacist Pager [phone]

## 2013-08-27 NOTE — Progress Notes (Signed)
UR Completed Kristie Bracewell Graves-Bigelow, RN,BSN [phone]  

## 2013-08-27 NOTE — Telephone Encounter (Signed)
Called stating he has been having "indigestion" type chest pain for 3-4 days. Has taken 2 NTG this AM without relief.  States pain seems similar to when he had his bypass surgery.  Pain across (L) chest.  Also states he has broken ribs in past and has pain on (R) side also.  Denies SOB. BP 138/61 in (R); 149/66 in (L) HR irregular. Advised to go to ER.  States he doesn't have transportation and will call 911. Notified Trish.

## 2013-08-27 NOTE — ED Provider Notes (Signed)
Point of anterior chest pain intermittent for the past 3-4 days. Discomfort is mild at present. He treated himself with aspirin with relief, sublingual nitroglycerin, without relief. He feels improved after drinking GI cocktail in the emergency department. Patient states the pain is worse with lying supine and improved with sitting up however it does feel like "heart pain" he's had in the past  Orlie Dakin, MD 08/27/13 1234

## 2013-08-27 NOTE — ED Provider Notes (Signed)
CSN: [id number]     Arrival date & time 08/27/13  1037 History   First MD Initiated Contact with Patient 08/27/13 1100     Chief Complaint  Patient presents with  . Chest Pain     (Consider location/radiation/quality/duration/timing/severity/associated sxs/prior Treatment) Patient is a 80 y.o. male presenting with chest pain.  Chest Pain Associated symptoms: no abdominal pain, no cough, no diaphoresis, no fever, no nausea, no palpitations, no shortness of breath and not vomiting     Ronald [name] is a 80 y.o. male PMH CAD s/p CABG in 1989 and redo bypass in 1997 per Dr. Garlan Fair [facility] May 2013 showed inferior scar with soft tissue attenuation and small region of anterior scar with possible small area of ischemia (managed conservatively at that time, nephrolithiasis, HTN, HLD, mild dementia, prostate cancer who presents with chest pain. He is accompanied by 2 family members.  Patient reports 2-3 days of constant, but waxing and waning left-sided chest pain that he describes as "indigestion." The pain is worse when lying flat and improves with sitting up. He rates it a 10 of 10 when it is at its worst. Sometimes wakes him up from sleep. No direct association with meals. He has tried taking Tums without much relief. Ibuprofen does help the pain, but he has been taking this cautiously as his doctor recommended. He tried taking 2 nitroglycerin this morning without relief, so decided he had enough and came to the ED. He also took an aspirin this am.   The pain is associated with some abdominal bloating but no belching, abdominal pain, nausea, vomiting. He denies shortness of breath, cough. He does NOT have exertional or unstable angina at baseline.   He has a history of CABG x2 and says his heart attack symptoms were similar. He follows with [facility]. Takes statin, ASA, Imdur. He does not take a beta blocker secondary to bradycardia. He does not take an Ace-inh secondary  to hypotension.   He bent over a large trash can and hurt his side a few days ago, but did not come to the ED to be evaluated because with a prior broken rib in the past, the provider has said that there was nothing to do.  History of Escherichia coli bacteremia secondary to UTI about 2 years ago. He follows with [facility] for recurrent UTI/bladder spasm.  Primary Care Physician: Dr. Burnice Logan Cardiologist: Burnell Blanks, MD   Past Medical History  Diagnosis Date  . Coronary artery disease     11/89 CABG  (70% circ; 90% PD; 60-70% distal left main; 30% left circ; 90% 1st diag;, 2nd diag and 3rd diag. w/90% mild LAD stenosis and 60-70% prox. stenosisin the LAD)  . Kidney stone     1970  . S/P T&A (status post tonsillectomy and adenoidectomy)     1954  . Cancer     prostate cancer and seed implant 1998  . Hyperlipidemia   . Dementia     mild  . Normal nuclear stress test July 2011  . Myocardial infarction    Past Surgical History  Procedure Laterality Date  . Coronary artery bypass graft      11/89 CABG  (70% circ; 90% PD; 60-70% distal left main; 30% left circ; 90% 1st diag;, 2nd diag and 3rd diag. w/90% mild LAD stenosis and 60-70% prox. stenosisin the LAD. Redo bypass in 1997  . Tonsillectomy    . Cholecystectomy    . Eus  10/05/2011    Procedure: ESOPHAGEAL ENDOSCOPIC ULTRASOUND (EUS) RADIAL;  Surgeon: Arta Silence, MD;  Location: [facility];  Service: Endoscopy;  Laterality: N/A;   Family History  Problem Relation Age of Onset  . Heart disease Mother   . Diabetes Father   . Heart disease Brother    History  Substance Use Topics  . Smoking status: Former Research scientist (life sciences)  . Smokeless tobacco: Current User  . Alcohol Use: No    Review of Systems  Constitutional: Negative for fever, diaphoresis and unexpected weight change.  Respiratory: Negative for cough and shortness of breath.   Cardiovascular: Positive for chest pain. Negative for palpitations and  leg swelling.  Gastrointestinal: Positive for abdominal distention. Negative for nausea, vomiting, abdominal pain and diarrhea.      Allergies  Ciprofloxacin; Nitrofurantoin; Sulfonamide derivatives; and Sulfamethoxazole  Home Medications   Prior to Admission medications   Medication Sig Start Date End Date Taking? Authorizing Provider  acetaminophen (TYLENOL) 500 MG tablet Take 500 mg by mouth every 6 (six) hours as needed for pain or fever. Take 1 -2 pills   Yes Historical Provider, MD  aspirin EC 325 MG tablet Take 325 mg by mouth daily.   Yes Historical Provider, MD  atorvastatin (LIPITOR) 40 MG tablet Take 40 mg by mouth at bedtime.    Yes Historical Provider, MD  Calcium Carbonate-Vitamin D (CALCIUM + D PO) Take 1 tablet by mouth 2 (two) times daily.    Yes Historical Provider, MD  Cholecalciferol (VITAMIN D) 2000 UNITS tablet Take 2,000 Units by mouth daily.   Yes Historical Provider, MD  galantamine (RAZADYNE ER) 8 MG 24 hr capsule Take 8 mg by mouth daily with breakfast.   Yes Historical Provider, MD  hydroxypropyl methylcellulose (ISOPTO TEARS) 2.5 % ophthalmic solution Place 1 drop into both eyes daily.   Yes Historical Provider, MD  isosorbide mononitrate (IMDUR) 30 MG 24 hr tablet Take 30 mg by mouth daily.     Yes Historical Provider, MD  Multiple Vitamin (MULTIVITAMIN WITH MINERALS) TABS Take 1 tablet by mouth daily.   Yes Historical Provider, MD  nitroGLYCERIN (NITROSTAT) 0.4 MG SL tablet Place 0.4 mg under the tongue every 5 (five) minutes as needed for chest pain.    Yes Historical Provider, MD  silodosin (RAPAFLO) 8 MG CAPS capsule Take 8 mg by mouth daily with breakfast.   Yes Historical Provider, MD   BP 134/63  Pulse 55  Temp(Src) 97.8 F (36.6 C) (Oral)  Resp 16  Ht 5\' 5"  (1.651 m)  Wt 162 lb (73.483 kg)  BMI 26.96 kg/m2  SpO2 95% Physical Exam  Constitutional: He is oriented to person, place, and time. He appears well-developed and well-nourished. No  distress.  HENT:  Head: Normocephalic and atraumatic.  Eyes: Conjunctivae and EOM are normal. Pupils are equal, round, and reactive to light.  Neck: Normal range of motion. Neck supple.  Cardiovascular: Normal rate and regular rhythm.   No murmur heard. Heart sounds distant, a little difficult to auscultate  Pulmonary/Chest: Effort normal and breath sounds normal. No respiratory distress. He has no wheezes. He has no rales. He exhibits no tenderness.  Abdominal: Soft. Bowel sounds are normal. He exhibits no distension.  Musculoskeletal: Normal range of motion. He exhibits no edema and no tenderness.  Neurological: He is alert and oriented to person, place, and time. No cranial nerve deficit.  Skin: Skin is warm and dry. He is not diaphoretic.  Psychiatric: He has a normal mood and affect.  ED Course  Procedures (including critical care time) Labs Review Labs Reviewed  CBC - Abnormal; Notable for the following:    Platelets 108 (*)    All other components within normal limits  BASIC METABOLIC PANEL - Abnormal; Notable for the following:    Glucose, Bld 106 (*)    BUN 25 (*)    GFR calc non Af Amer 65 (*)    GFR calc Af Amer 76 (*)    All other components within normal limits  LIPASE, BLOOD  I-STAT TROPOININ, ED    Imaging Review Dg Chest Port 1 View  08/27/2013   CLINICAL DATA:  Left-sided chest pain  EXAM: PORTABLE CHEST - 1 VIEW  COMPARISON:  05/19/2012  FINDINGS: Cardiac shadow is stable with evidence of prior surgery. The lungs are well aerated bilaterally. Some mild chronic scarring is noted in the left base. No acute bony abnormality is seen.  IMPRESSION: Chronic changes without acute abnormality.   Electronically Signed   By: [name] Catalina M.D.   On: 08/27/2013 12:07     EKG Interpretation   Date/Time:  Tuesday August 27 2013 10:41:52 EDT Ventricular Rate:  51 PR Interval:  229 QRS Duration: 115 QT Interval:  465 QTC Calculation: 428 R Axis:   -26 Text  Interpretation:  Sinus rhythm Atrial premature complex Prolonged PR  interval Nonspecific intraventricular conduction delay Low voltage,  precordial leads Abnormal inferior Q waves Nonspecific T abnormalities,  lateral leads No significant change since last tracing Confirmed by  JACUBOWITZ  MD, SAM ([id number]) on 08/27/2013 12:56:46 PM     Rate 51, left axis deviation, PR interval borderline prolonged (about 87mm), no ST elevation or depression, no T-wave inversion.  MDM   Final diagnoses:  Chest pain    Ronald [name] is a 80 y.o. male with significant cardiac history including CABG x2 who presents with chest pain x3 days. It is worse when lying flat. He says it is similar to chest pain related to ACS in the past. Differential includes ACS, GERD, rib strain from recent injury (diagnosis of exclusion). Ordered basic labs, delta troponin, chest x-ray, lipase, morphine prn pain, GI cocktail, cardiac monitoring.  11:45am First troponin negative. BMP/CBC largely unremarkable. Patient says his pain improved with the GI cocktail. Spoke with Dr. Tamala Julian, on call cardiologist for [facility], he will see the patient.  1:30pm CXR shows Chronic changes without acute abnormality.  Cardiology to admit. Plan IV heparin, serial markers, and cath tomorrow to define anatomy and help guide therapy.  Lesly Dukes, MD 08/27/13 1328

## 2013-08-27 NOTE — H&P (Signed)
I examined and interviewed the patient along with Ms. Ingold. He has symptoms that are identical to pre-Re do bypass. Despite this complaint, there are atypical features, including continuous pain for 48 hours, no response to NTG, and  negative markers. Two years ago, he had myocardial perfusion imaging with inferior scar and small region of anterior ischemia. ECG's are non ischemic. Plan IV heparin, serial markers, and cath tomorrow to define anatomy and help guide therapy.

## 2013-08-27 NOTE — Telephone Encounter (Signed)
New message     Pt has "indigestion"----he took two nitro this am and it is still there.  Please advise

## 2013-08-27 NOTE — ED Provider Notes (Signed)
I have personally seen and examined the patient.  I have discussed the plan of care with the resident.  I have reviewed the documentation on PMH/FH/Soc. History.  I have reviewed the documentation of the resident and agree.  Orlie Dakin, MD 08/27/13 [phone]

## 2013-08-27 NOTE — ED Notes (Signed)
EMS - Pt coming from home with substernal chest pain with radiation to the left arm that has been ongoing x 1 month.  Pt has no associated SOB, dizziness or lightheadedness.  Pt states the chest pain feels like it goes to his back.  Pt took 1 Aspirin and 2 nitro at home.

## 2013-08-28 ENCOUNTER — Encounter (HOSPITAL_COMMUNITY)
Admission: EM | Disposition: A | Discharge status: Home / Self Care | Payer: Self-pay | Source: Home / Self Care | Attending: Interventional Cardiology

## 2013-08-28 DIAGNOSIS — E7800 Pure hypercholesterolemia, unspecified: Secondary | ICD-10-CM | POA: Diagnosis not present

## 2013-08-28 DIAGNOSIS — I2510 Atherosclerotic heart disease of native coronary artery without angina pectoris: Secondary | ICD-10-CM | POA: Diagnosis not present

## 2013-08-28 DIAGNOSIS — I498 Other specified cardiac arrhythmias: Secondary | ICD-10-CM | POA: Diagnosis not present

## 2013-08-28 HISTORY — PX: LEFT HEART CATHETERIZATION WITH CORONARY/GRAFT ANGIOGRAM: SHX5450

## 2013-08-28 LAB — BASIC METABOLIC PANEL
BUN: 26 mg/dL — ABNORMAL HIGH (ref 6–23)
CO2: 27 mEq/L (ref 19–32)
Calcium: 9.2 mg/dL (ref 8.4–10.5)
Chloride: 107 mEq/L (ref 96–112)
Creatinine, Ser: 1.25 mg/dL (ref 0.50–1.35)
GFR calc Af Amer: 59 mL/min — ABNORMAL LOW (ref >90)
GFR calc non Af Amer: 51 mL/min — ABNORMAL LOW (ref >90)
Glucose, Bld: 98 mg/dL (ref 70–99)
Potassium: 4.6 mEq/L (ref 3.7–5.3)
Sodium: 143 mEq/L (ref 137–147)

## 2013-08-28 LAB — CREATININE, SERUM
Creatinine, Ser: 1.13 mg/dL (ref 0.50–1.35)
GFR calc Af Amer: 67 mL/min — ABNORMAL LOW (ref >90)
GFR calc non Af Amer: 58 mL/min — ABNORMAL LOW (ref >90)

## 2013-08-28 LAB — HEPATIC FUNCTION PANEL
ALT: 14 U/L (ref 0–53)
AST: 22 U/L (ref 0–37)
Albumin: 3.3 g/dL — ABNORMAL LOW (ref 3.5–5.2)
Alkaline Phosphatase: 85 U/L (ref 39–117)
Bilirubin, Direct: <0.2 mg/dL (ref 0.0–0.3)
Total Bilirubin: 0.5 mg/dL (ref 0.3–1.2)
Total Protein: 6 g/dL (ref 6.0–8.3)

## 2013-08-28 LAB — TROPONIN I: Troponin I: <0.3 ng/mL (ref <0.30)

## 2013-08-28 LAB — CBC
HCT: 38.1 % — ABNORMAL LOW (ref 39.0–52.0)
HCT: 40.6 % (ref 39.0–52.0)
Hemoglobin: 12.9 g/dL — ABNORMAL LOW (ref 13.0–17.0)
Hemoglobin: 13.2 g/dL (ref 13.0–17.0)
MCH: 29.9 pg (ref 26.0–34.0)
MCH: 30.7 pg (ref 26.0–34.0)
MCHC: 32.5 g/dL (ref 30.0–36.0)
MCHC: 33.9 g/dL (ref 30.0–36.0)
MCV: 90.7 fL (ref 78.0–100.0)
MCV: 92.1 fL (ref 78.0–100.0)
Platelets: 103 10*3/uL — ABNORMAL LOW (ref 150–400)
Platelets: 99 10*3/uL — ABNORMAL LOW (ref 150–400)
RBC: 4.2 MIL/uL — ABNORMAL LOW (ref 4.22–5.81)
RBC: 4.41 MIL/uL (ref 4.22–5.81)
RDW: 13.9 % (ref 11.5–15.5)
RDW: 14 % (ref 11.5–15.5)
WBC: 5.3 10*3/uL (ref 4.0–10.5)
WBC: 5.7 10*3/uL (ref 4.0–10.5)

## 2013-08-28 LAB — LIPID PANEL
Cholesterol: 126 mg/dL (ref 0–200)
HDL: 54 mg/dL (ref >39)
LDL Cholesterol: 65 mg/dL (ref 0–99)
Total CHOL/HDL Ratio: 2.3 RATIO
Triglycerides: 34 mg/dL (ref <150)
VLDL: 7 mg/dL (ref 0–40)

## 2013-08-28 LAB — HEPARIN LEVEL (UNFRACTIONATED)
Heparin Unfractionated: 0.46 IU/mL (ref 0.30–0.70)
Heparin Unfractionated: 0.52 IU/mL (ref 0.30–0.70)

## 2013-08-28 SURGERY — LEFT HEART CATHETERIZATION WITH CORONARY/GRAFT ANGIOGRAM
Anesthesia: LOCAL

## 2013-08-28 MED ORDER — HEPARIN SODIUM (PORCINE) 5000 UNIT/ML IJ SOLN
5000.0000 [IU] | Freq: Three times a day (TID) | INTRAMUSCULAR | Status: DC
  Administered 2013-08-28 (×2): 5000 [IU] via SUBCUTANEOUS
  Filled 2013-08-28 (×6): qty 1

## 2013-08-28 MED ORDER — MIDAZOLAM HCL 2 MG/2ML IJ SOLN
INTRAMUSCULAR | Status: AC
  Filled 2013-08-28: qty 2

## 2013-08-28 MED ORDER — VERAPAMIL HCL 2.5 MG/ML IV SOLN
INTRAVENOUS | Status: AC
  Filled 2013-08-28: qty 2

## 2013-08-28 MED ORDER — SODIUM CHLORIDE 0.9 % IV SOLN: INTRAVENOUS | Status: AC

## 2013-08-28 MED ORDER — LIDOCAINE HCL (PF) 1 % IJ SOLN
INTRAMUSCULAR | Status: AC
  Filled 2013-08-28: qty 30

## 2013-08-28 MED ORDER — HEPARIN SODIUM (PORCINE) 1000 UNIT/ML IJ SOLN
INTRAMUSCULAR | Status: AC
  Filled 2013-08-28: qty 1

## 2013-08-28 MED ORDER — HEPARIN (PORCINE) IN NACL 2-0.9 UNIT/ML-% IJ SOLN
INTRAMUSCULAR | Status: AC
  Filled 2013-08-28: qty 1500

## 2013-08-28 MED ORDER — NITROGLYCERIN 0.2 MG/ML ON CALL CATH LAB
INTRAVENOUS | Status: AC
  Filled 2013-08-28: qty 1

## 2013-08-28 MED ORDER — FENTANYL CITRATE 0.05 MG/ML IJ SOLN
INTRAMUSCULAR | Status: AC
  Filled 2013-08-28: qty 2

## 2013-08-28 NOTE — H&P (View-Only) (Signed)
Patient Profile:  Primary Cardiologist:Dr. Angelena Form  80 yo male with history of CAD s/p CABG in 1989 and redo bypass in 1997 per Dr. Redmond Pulling, nephrolithiasis, HTN, HLD, mild dementia, prostate cancer, admitted for chest pain.   ECGs non ischemic. Cardiac enzymes negative x 3.   Subjective: Currently CP free. States that he feels tired. Complains of a several day h/o dysuria.   Objective: Vital signs in last 24 hours: Temp:  [97.7 F (36.5 C)-98.2 F (36.8 C)] 98.2 F (36.8 C) (06/03 0521) Pulse Rate:  [48-66] 48 (06/03 0521) Resp:  [15-18] 18 (06/03 0521) BP: (98-140)/(48-73) 140/61 mmHg (06/03 0521) SpO2:  [95 %-100 %] 97 % (06/03 0521) Weight:  [157 lb 11.2 oz (71.532 kg)-162 lb (73.483 kg)] 157 lb 11.2 oz (71.532 kg) (06/03 0521)    Intake/Output from previous day: 06/02 0701 - 06/03 0700 In: -  Out: 200 [Urine:200] Intake/Output this shift:    Medications Current Facility-Administered Medications  Medication Dose Route Frequency Provider Last Rate Last Dose  . 0.9 %  sodium chloride infusion   Intravenous Continuous Cecilie Kicks, NP 10 mL/hr at 08/27/13 1536    . 0.9 %  sodium chloride infusion  250 mL Intravenous PRN Cecilie Kicks, NP      . 0.9 %  sodium chloride infusion  1 mL/kg/hr Intravenous Continuous Cecilie Kicks, NP 73.5 mL/hr at 08/28/13 0354 1 mL/kg/hr at 08/28/13 0354  . acetaminophen (TYLENOL) tablet 500 mg  500 mg Oral Q6H PRN Cecilie Kicks, NP      . acetaminophen (TYLENOL) tablet 650 mg  650 mg Oral Q4H PRN Cecilie Kicks, NP      . ALPRAZolam Duanne Moron) tablet 0.25 mg  0.25 mg Oral BID PRN Cecilie Kicks, NP   0.25 mg at 08/27/13 2231  . aspirin chewable tablet 324 mg  324 mg Oral NOW Cecilie Kicks, NP       Or  . aspirin suppository 300 mg  300 mg Rectal NOW Cecilie Kicks, NP      . aspirin EC tablet 325 mg  325 mg Oral Daily Cecilie Kicks, NP      . aspirin EC tablet 81 mg  81 mg Oral Daily Cecilie Kicks, NP      . atorvastatin (LIPITOR) tablet 40 mg  40 mg  Oral QHS Cecilie Kicks, NP   40 mg at 08/27/13 2231  . cholecalciferol (VITAMIN D) tablet 2,000 Units  2,000 Units Oral Daily Cecilie Kicks, NP      . galantamine (RAZADYNE ER) 24 hr capsule 8 mg  8 mg Oral Q breakfast Cecilie Kicks, NP      . heparin ADULT infusion 100 units/mL (25000 units/250 mL)  850 Units/hr Intravenous Continuous Darnell Level Mancheril, RPH 8.5 mL/hr at 08/27/13 1538 850 Units/hr at 08/27/13 1538  . isosorbide mononitrate (IMDUR) 24 hr tablet 60 mg  60 mg Oral Daily Cecilie Kicks, NP      . morphine 2 MG/ML injection 2 mg  2 mg Intravenous Q3H PRN Lesly Dukes, MD   2 mg at 08/27/13 2035  . multivitamin with minerals tablet 1 tablet  1 tablet Oral Daily Cecilie Kicks, NP      . nitroGLYCERIN (NITROSTAT) SL tablet 0.4 mg  0.4 mg Sublingual Q5 min PRN Lesly Dukes, MD      . nitroGLYCERIN (NITROSTAT) SL tablet 0.4 mg  0.4 mg Sublingual Q5 min PRN Cecilie Kicks, NP      . nitroGLYCERIN (NITROSTAT) SL tablet 0.4 mg  0.4 mg Sublingual  Q5 Min x 3 PRN Cecilie Kicks, NP      . ondansetron [facility]) injection 4 mg  4 mg Intravenous Q6H PRN Cecilie Kicks, NP      . pantoprazole (PROTONIX) EC tablet 40 mg  40 mg Oral BID AC Cecilie Kicks, NP   40 mg at 08/27/13 1321  . polyvinyl alcohol (LIQUIFILM TEARS) 1.4 % ophthalmic solution 1 drop  1 drop Both Eyes Daily [name], MD   1 drop at 08/27/13 1610  . silodosin (RAPAFLO) capsule 8 mg  8 mg Oral Q breakfast [name], MD      . sodium chloride 0.9 % injection 3 mL  3 mL Intravenous Q12H Cecilie Kicks, NP   3 mL at 08/27/13 1547  . sodium chloride 0.9 % injection 3 mL  3 mL Intravenous PRN Cecilie Kicks, NP        PE: General appearance: alert, cooperative and no distress Lungs: clear to auscultation bilaterally Heart: regular rate and rhythm, S1, S2 normal, no murmur, click, rub or gallop Extremities: no LEE Pulses: 2+ and symmetric Skin: warm and dry Neurologic: Grossly normal Obese  Lab Results:   Recent Labs   08/27/13 1120 08/28/13 0020  WBC 6.7 5.3  HGB 13.6 13.2  HCT 40.7 40.6  PLT 108* 99*   BMET  Recent Labs  08/27/13 1120 08/28/13 0020  NA 142 143  K 4.8 4.6  CL 104 107  CO2 28 27  GLUCOSE 106* 98  BUN 25* 26*  CREATININE 1.02 1.25  CALCIUM 10.4 9.2   PT/INR  Recent Labs  08/27/13 1556  LABPROT 15.3*  INR 1.24   Cholesterol  Recent Labs  08/28/13 0020  CHOL 126   Cardiac Enzymes Cardiac Panel (last 3 results)  Recent Labs  08/27/13 1351 08/27/13 1905 08/28/13 0020  TROPONINI <0.30 <0.30 <0.30    Studies/Results: CXR 08/27/13 EXAM: PORTABLE CHEST - 1 VIEW  COMPARISON: 05/19/2012  FINDINGS: Cardiac shadow is stable with evidence of prior surgery. The lungs are well aerated bilaterally. Some mild chronic scarring is noted in the left base. No acute bony abnormality is seen.  IMPRESSION: Chronic changes without acute abnormality.   Assessment/Plan  Principal Problem:   Unstable angina Active Problems:   HYPERCHOLESTEROLEMIA   CORONARY ARTERY DISEASE, hx of CABG in 1989 and re-do in 1997   HYPERTENSION NEC   Bradycardia   Chest pain  Plan:  1. Chest Pain/ CAD: s/p CABG in 1989 and redo bypass in 1997. Currently CP free on IV heparin. Enzymes negative x3. Plan for diagnostic LHC +/- PCI. Continue ASA, Imdur and statin. No BB given bradycardia.   2. HTN: Controlled.   3. HLD:  lipid panel looks great. LDL is at goal of < 70 at 65. HDL 54. Triglycerides 34. Continue statin.   4. Bradycardia: resting HR in the 40s. He is on no AV nodal blocking agents. He denies syncope/ near syncope but states that he always feels tired. His TSH is elevated at 5.410, but Free T4 is WNL at 1.24.  ? Need for PPM at a later time.    5. Dysuria: has h/o UTIs. Will obtain U/A.    LOS: 1 day    Brittainy M. Ladoris Gene 08/28/2013 7:48 AM  Personally seen and examined. Agree with above. Last lexiscan myoview May 2013 because of dyspnea and fatigue with  inferior scar with soft tissue attenuation and small region of anterior scar with possible small area of  ischemia. This was felt to be low risk  Last cath 2006:  1. Mild-to-moderate left ventricular dysfunction with inferobasilar  akinesia.  2. Totally occluded native coronary circulation with diffuse disease in the  proximal left anterior descending diagonal system which potentially  could be ischemic.  3. Totally occluded saphenous vein graft to the intermediate diagonal and  saphenous vein graft to posterior descending and right coronary artery  branches.  4. Patent saphenous vein graft to the obtuse marginal with collaterals to  the distal right coronary artery and patent left internal mammary graft  to the left anterior descending and diagonal systems  He was treated medically.     CATH today Monitor Bradycardia. Avoid Bb. Hopefully will not need PPM.   Candee Furbish, MD

## 2013-08-28 NOTE — Progress Notes (Signed)
ANTICOAGULATION CONSULT NOTE - Pharmacy Consult for Heparin Indication: chest pain/ACS  Allergies  Allergen Reactions  . Ciprofloxacin Swelling and Hives    Allergy classified as swelling here, but pt says Cipro just doesn't work for him    . Nitrofurantoin Hives    Hives that lasted for weeks- he had to go to the hospital   . Sulfonamide Derivatives Hives       . Sulfamethoxazole Hives    Patient Measurements: Height: 5\' 5"  (165.1 cm) Weight: 162 lb (73.483 kg) IBW/kg (Calculated) : 61.5  Vital Signs: Temp: 98.1 F (36.7 C) (06/02 2027) Temp src: Oral (06/02 2027) BP: 98/53 mmHg (06/02 2027) Pulse Rate: 66 (06/02 2027)  Labs:  Recent Labs  08/27/13 1120 08/27/13 1351 08/27/13 1556 08/27/13 1905 08/28/13 0020  HGB 13.6  --   --   --  13.2  HCT 40.7  --   --   --  40.6  PLT 108*  --   --   --  99*  APTT  --   --  179*  --   --   LABPROT  --   --  15.3*  --   --   INR  --   --  1.24  --   --   HEPARINUNFRC  --   --   --   --  0.46  CREATININE 1.02  --   --   --   --   TROPONINI  --  <0.30  --  <0.30  --     Estimated Creatinine Clearance: 46.9 ml/min (by C-G formula based on Cr of 1.02).  Assessment: 80 y.o. male with chest pain for heparin  Goal of Therapy:  Heparin level 0.3-0.7 units/ml Monitor platelets by anticoagulation protocol: Yes   Plan:  Continue Heparin at current rate Follow-up am labs.  Phillis Knack, PharmD, BCPS

## 2013-08-28 NOTE — Progress Notes (Signed)
Patient Profile:  Primary Cardiologist:Dr. Angelena Form  80 yo male with history of CAD s/p CABG in 1989 and redo bypass in 1997 per Dr. Redmond Pulling, nephrolithiasis, HTN, HLD, mild dementia, prostate cancer, admitted for chest pain.   ECGs non ischemic. Cardiac enzymes negative x 3.   Subjective: Currently CP free. States that he feels tired. Complains of a several day h/o dysuria.   Objective: Vital signs in last 24 hours: Temp:  [97.7 F (36.5 C)-98.2 F (36.8 C)] 98.2 F (36.8 C) (06/03 0521) Pulse Rate:  [48-66] 48 (06/03 0521) Resp:  [15-18] 18 (06/03 0521) BP: (98-140)/(48-73) 140/61 mmHg (06/03 0521) SpO2:  [95 %-100 %] 97 % (06/03 0521) Weight:  [157 lb 11.2 oz (71.532 kg)-162 lb (73.483 kg)] 157 lb 11.2 oz (71.532 kg) (06/03 0521)    Intake/Output from previous day: 06/02 0701 - 06/03 0700 In: -  Out: 200 [Urine:200] Intake/Output this shift:    Medications Current Facility-Administered Medications  Medication Dose Route Frequency Provider Last Rate Last Dose  . 0.9 %  sodium chloride infusion   Intravenous Continuous Cecilie Kicks, NP 10 mL/hr at 08/27/13 1536    . 0.9 %  sodium chloride infusion  250 mL Intravenous PRN Cecilie Kicks, NP      . 0.9 %  sodium chloride infusion  1 mL/kg/hr Intravenous Continuous Cecilie Kicks, NP 73.5 mL/hr at 08/28/13 0354 1 mL/kg/hr at 08/28/13 0354  . acetaminophen (TYLENOL) tablet 500 mg  500 mg Oral Q6H PRN Cecilie Kicks, NP      . acetaminophen (TYLENOL) tablet 650 mg  650 mg Oral Q4H PRN Cecilie Kicks, NP      . ALPRAZolam Duanne Moron) tablet 0.25 mg  0.25 mg Oral BID PRN Cecilie Kicks, NP   0.25 mg at 08/27/13 2231  . aspirin chewable tablet 324 mg  324 mg Oral NOW Cecilie Kicks, NP       Or  . aspirin suppository 300 mg  300 mg Rectal NOW Cecilie Kicks, NP      . aspirin EC tablet 325 mg  325 mg Oral Daily Cecilie Kicks, NP      . aspirin EC tablet 81 mg  81 mg Oral Daily Cecilie Kicks, NP      . atorvastatin (LIPITOR) tablet 40 mg  40 mg  Oral QHS Cecilie Kicks, NP   40 mg at 08/27/13 2231  . cholecalciferol (VITAMIN D) tablet 2,000 Units  2,000 Units Oral Daily Cecilie Kicks, NP      . galantamine (RAZADYNE ER) 24 hr capsule 8 mg  8 mg Oral Q breakfast Cecilie Kicks, NP      . heparin ADULT infusion 100 units/mL (25000 units/250 mL)  850 Units/hr Intravenous Continuous Darnell Level Mancheril, RPH 8.5 mL/hr at 08/27/13 1538 850 Units/hr at 08/27/13 1538  . isosorbide mononitrate (IMDUR) 24 hr tablet 60 mg  60 mg Oral Daily Cecilie Kicks, NP      . morphine 2 MG/ML injection 2 mg  2 mg Intravenous Q3H PRN Lesly Dukes, MD   2 mg at 08/27/13 2035  . multivitamin with minerals tablet 1 tablet  1 tablet Oral Daily Cecilie Kicks, NP      . nitroGLYCERIN (NITROSTAT) SL tablet 0.4 mg  0.4 mg Sublingual Q5 min PRN Lesly Dukes, MD      . nitroGLYCERIN (NITROSTAT) SL tablet 0.4 mg  0.4 mg Sublingual Q5 min PRN Cecilie Kicks, NP      . nitroGLYCERIN (NITROSTAT) SL tablet 0.4 mg  0.4 mg Sublingual  Q5 Min x 3 PRN Cecilie Kicks, NP      . ondansetron [facility]) injection 4 mg  4 mg Intravenous Q6H PRN Cecilie Kicks, NP      . pantoprazole (PROTONIX) EC tablet 40 mg  40 mg Oral BID AC Cecilie Kicks, NP   40 mg at 08/27/13 1321  . polyvinyl alcohol (LIQUIFILM TEARS) 1.4 % ophthalmic solution 1 drop  1 drop Both Eyes Daily [name], MD   1 drop at 08/27/13 1610  . silodosin (RAPAFLO) capsule 8 mg  8 mg Oral Q breakfast [name], MD      . sodium chloride 0.9 % injection 3 mL  3 mL Intravenous Q12H Cecilie Kicks, NP   3 mL at 08/27/13 1547  . sodium chloride 0.9 % injection 3 mL  3 mL Intravenous PRN Cecilie Kicks, NP        PE: General appearance: alert, cooperative and no distress Lungs: clear to auscultation bilaterally Heart: regular rate and rhythm, S1, S2 normal, no murmur, click, rub or gallop Extremities: no LEE Pulses: 2+ and symmetric Skin: warm and dry Neurologic: Grossly normal Obese  Lab Results:   Recent Labs   08/27/13 1120 08/28/13 0020  WBC 6.7 5.3  HGB 13.6 13.2  HCT 40.7 40.6  PLT 108* 99*   BMET  Recent Labs  08/27/13 1120 08/28/13 0020  NA 142 143  K 4.8 4.6  CL 104 107  CO2 28 27  GLUCOSE 106* 98  BUN 25* 26*  CREATININE 1.02 1.25  CALCIUM 10.4 9.2   PT/INR  Recent Labs  08/27/13 1556  LABPROT 15.3*  INR 1.24   Cholesterol  Recent Labs  08/28/13 0020  CHOL 126   Cardiac Enzymes Cardiac Panel (last 3 results)  Recent Labs  08/27/13 1351 08/27/13 1905 08/28/13 0020  TROPONINI <0.30 <0.30 <0.30    Studies/Results: CXR 08/27/13 EXAM: PORTABLE CHEST - 1 VIEW  COMPARISON: 05/19/2012  FINDINGS: Cardiac shadow is stable with evidence of prior surgery. The lungs are well aerated bilaterally. Some mild chronic scarring is noted in the left base. No acute bony abnormality is seen.  IMPRESSION: Chronic changes without acute abnormality.   Assessment/Plan  Principal Problem:   Unstable angina Active Problems:   HYPERCHOLESTEROLEMIA   CORONARY ARTERY DISEASE, hx of CABG in 1989 and re-do in 1997   HYPERTENSION NEC   Bradycardia   Chest pain  Plan:  1. Chest Pain/ CAD: s/p CABG in 1989 and redo bypass in 1997. Currently CP free on IV heparin. Enzymes negative x3. Plan for diagnostic LHC +/- PCI. Continue ASA, Imdur and statin. No BB given bradycardia.   2. HTN: Controlled.   3. HLD:  lipid panel looks great. LDL is at goal of < 70 at 65. HDL 54. Triglycerides 34. Continue statin.   4. Bradycardia: resting HR in the 40s. He is on no AV nodal blocking agents. He denies syncope/ near syncope but states that he always feels tired. His TSH is elevated at 5.410, but Free T4 is WNL at 1.24.  ? Need for PPM at a later time.    5. Dysuria: has h/o UTIs. Will obtain U/A.    LOS: 1 day    Ronald [name] 08/28/2013 7:48 AM  Personally seen and examined. Agree with above. Last lexiscan myoview May 2013 because of dyspnea and fatigue with  inferior scar with soft tissue attenuation and small region of anterior scar with possible small area of  ischemia. This was felt to be low risk  Last cath 2006:  1. Mild-to-moderate left ventricular dysfunction with inferobasilar  akinesia.  2. Totally occluded native coronary circulation with diffuse disease in the  proximal left anterior descending diagonal system which potentially  could be ischemic.  3. Totally occluded saphenous vein graft to the intermediate diagonal and  saphenous vein graft to posterior descending and right coronary artery  branches.  4. Patent saphenous vein graft to the obtuse marginal with collaterals to  the distal right coronary artery and patent left internal mammary graft  to the left anterior descending and diagonal systems  He was treated medically.     CATH today Monitor Bradycardia. Avoid Bb. Hopefully will not need PPM.   Ronald Furbish, MD

## 2013-08-28 NOTE — CV Procedure (Signed)
Left Heart Catheterization with Coronary and Bypass Graft Angiography Report  Ronald [name]  80 y.o.  male Jun 15, 1927  Procedure Date: 08/28/2013 Referring Physician: Darlina Guys, M.D. Primary Cardiologist:: Same  INDICATIONS: Prolonged chest pain with atypical features in a patient with a long history of coronary artery disease and two prior bypass operations.  PROCEDURE: 1. Left heart catheterization; 2. Left ventriculography; 3. Coronary angiography; 4. Bypass graft angiography  CONSENT:  The risks, benefits, and details of the procedure were explained in detail to the patient. Risks including death, stroke, heart attack, kidney injury, allergy, limb ischemia, bleeding and radiation injury were discussed.  The patient verbalized understanding and wanted to proceed.  Informed written consent was obtained.  PROCEDURE TECHNIQUE:  After Xylocaine anesthesia a 5 French Slender sheath was placed in left radial artery with an angiocath and the modified Seldinger technique.  Coronary angiography was done using a 5 F  JR 4, intramammary artery catheter, and JL 4 cm diagnostic catheters.  Left ventriculography was done using the  JR 4 after wire exchange for the left Judkins catheter and hand injection.   A wrist band was applied with good hemostasis.   CONTRAST:  Total of  110 cc.  COMPLICATIONS:  None   HEMODYNAMICS:  Aortic pressure  98/44 mmHg; LV pressure  98/1 mmHg; LVEDP  4 mmHg  ANGIOGRAPHIC DATA:   The left main coronary artery is  is severely involved distally with a string-like obstruction of at least 95%. This vessel is heavily calcified..  The left anterior descending artery is  totally occluded..  The left circumflex artery is  totally occluded.  The right coronary artery is  totally occluded proximally. Right to right homocollaterals and noted to feel a right ventricular branch.Marland Kitchen  BYPASS GRAFT ANGIOGRAPHY:  The saphenous vein graft to the obtuse marginal is widely  patent there is proximal segmental 30-50% narrowing. The obtuse marginal territory supplies collaterals to the distal right coronary.  The saphenous vein graft to the right coronary is totally occluded  The saphenous vein graft to the ramus/diagonal is totally occluded  The left internal mammary graft to the LAD and diagonal is widely patent   LEFT VENTRICULOGRAM:  Left ventricular angiogram was done in the 30 RAO projection and revealed  a well formed inferoposterior aneurysm. The LV cavity is dilated. A focal portion of the mid anterior wall is akinetic. Overall LV function reveals an EF of 35-40%. No obvious mitral regurgitation is noted although power injection was not performed and mild regurgitation could be missed.   IMPRESSIONS:  1. Widely patent sequential left internal graft to the diagonal/LAD.  2. Widely patent saphenous vein graft to the obtuse marginal with proximal 50% narrowing noted in the graft.  3. Total occlusion of the saphenous vein grafts to the right coronary, the ramus intermedius, and the diagonal.  4. Left ventricular dysfunction with inferobasal aneurysm and mid anterior wall region of akinesis. Overall EF is 35-40% .  5. Total occlusion of the native circulation.   RECOMMENDATION:  Nearly continuous chest discomfort unrelieved by nitroglycerin seems unlikely to been ischemic and was possibly gastrointestinal in origin. Anatomy is is not significantly changed compared to the 2006 catheterization performed by Dr. Doreatha Lew.  Plan conservative medical management. Perhaps attention to possible gastrointestinal source of pain can be addressed.  Decreased left ventricular function raises the possibility of future heart failure/sudden death. Therefore intensification/optimization of heart failure regimen and/or device therapy should be discussed. Device therapy may  not be prudent given the patient's age.

## 2013-08-28 NOTE — Progress Notes (Signed)
Reassuring cath, no change in anatomy.  Hopeful DC tomorrow or later today.  Candee Furbish, MD

## 2013-08-28 NOTE — Interval H&P Note (Signed)
Cath Lab Visit (complete for each Cath Lab visit)  Clinical Evaluation Leading to the Procedure:   ACS: no  Non-ACS:    Anginal Classification: CCS [name]  Anti-ischemic medical therapy: Minimal Therapy (1 class of medications)  Non-Invasive Test Results: No non-invasive testing performed  Prior CABG: Previous CABG      History and Physical Interval Note:  08/28/2013 1:03 PM  Ronald [name]  has presented today for surgery, with the diagnosis of cp  The various methods of treatment have been discussed with the patient and family. After consideration of risks, benefits and other options for treatment, the patient has consented to  Procedure(s): LEFT HEART CATHETERIZATION WITH CORONARY/GRAFT ANGIOGRAM (N/A) as a surgical intervention .  The patient's history has been reviewed, patient examined, no change in status, stable for surgery.  I have reviewed the patient's chart and labs.  Questions were answered to the patient's satisfaction.     Ronald [name]

## 2013-08-29 ENCOUNTER — Other Ambulatory Visit: Payer: Self-pay

## 2013-08-29 DIAGNOSIS — I498 Other specified cardiac arrhythmias: Secondary | ICD-10-CM | POA: Diagnosis not present

## 2013-08-29 DIAGNOSIS — I2510 Atherosclerotic heart disease of native coronary artery without angina pectoris: Secondary | ICD-10-CM | POA: Diagnosis not present

## 2013-08-29 DIAGNOSIS — I2582 Chronic total occlusion of coronary artery: Secondary | ICD-10-CM | POA: Diagnosis not present

## 2013-08-29 DIAGNOSIS — F0390 Unspecified dementia without behavioral disturbance: Secondary | ICD-10-CM | POA: Diagnosis not present

## 2013-08-29 DIAGNOSIS — I25810 Atherosclerosis of coronary artery bypass graft(s) without angina pectoris: Secondary | ICD-10-CM | POA: Diagnosis not present

## 2013-08-29 DIAGNOSIS — E7800 Pure hypercholesterolemia, unspecified: Secondary | ICD-10-CM | POA: Diagnosis not present

## 2013-08-29 DIAGNOSIS — R079 Chest pain, unspecified: Secondary | ICD-10-CM | POA: Diagnosis not present

## 2013-08-29 DIAGNOSIS — I200 Unstable angina: Secondary | ICD-10-CM | POA: Diagnosis not present

## 2013-08-29 LAB — BASIC METABOLIC PANEL
BUN: 26 mg/dL — ABNORMAL HIGH (ref 6–23)
CO2: 22 mEq/L (ref 19–32)
Calcium: 8.9 mg/dL (ref 8.4–10.5)
Chloride: 105 mEq/L (ref 96–112)
Creatinine, Ser: 1.11 mg/dL (ref 0.50–1.35)
GFR calc Af Amer: 68 mL/min — ABNORMAL LOW (ref >90)
GFR calc non Af Amer: 59 mL/min — ABNORMAL LOW (ref >90)
Glucose, Bld: 110 mg/dL — ABNORMAL HIGH (ref 70–99)
Potassium: 4.3 mEq/L (ref 3.7–5.3)
Sodium: 138 mEq/L (ref 137–147)

## 2013-08-29 LAB — CBC
HCT: 39.5 % (ref 39.0–52.0)
Hemoglobin: 13.1 g/dL (ref 13.0–17.0)
MCH: 30.4 pg (ref 26.0–34.0)
MCHC: 33.2 g/dL (ref 30.0–36.0)
MCV: 91.6 fL (ref 78.0–100.0)
Platelets: 102 10*3/uL — ABNORMAL LOW (ref 150–400)
RBC: 4.31 MIL/uL (ref 4.22–5.81)
RDW: 14.2 % (ref 11.5–15.5)
WBC: 6.5 10*3/uL (ref 4.0–10.5)

## 2013-08-29 MED ORDER — ACETAMINOPHEN 325 MG PO TABS
650.0000 mg | ORAL_TABLET | Freq: Four times a day (QID) | ORAL | Status: DC | PRN
  Filled 2013-08-29: qty 2

## 2013-08-29 MED ORDER — ASPIRIN 81 MG PO TBEC: 81.0000 mg | DELAYED_RELEASE_TABLET | Freq: Every day | ORAL | Status: DC

## 2013-08-29 MED ORDER — PANTOPRAZOLE SODIUM 40 MG PO TBEC: 40.0000 mg | DELAYED_RELEASE_TABLET | Freq: Two times a day (BID) | ORAL | Status: DC

## 2013-08-29 MED ORDER — ISOSORBIDE MONONITRATE ER 60 MG PO TB24: 60.0000 mg | ORAL_TABLET | Freq: Every day | ORAL | Status: DC

## 2013-08-29 NOTE — Progress Notes (Signed)
Patient Profile: 80 yo male with history of CAD s/p CABG in 1989 and redo bypass in 1997 per Dr. Redmond Pulling, nephrolithiasis, HTN, HLD, mild dementia, prostate cancer admitted for chest pain.  Subjective: Denies any further chest pain.  Objective: Vital signs in last 24 hours: Temp:  [97.5 F (36.4 C)-97.9 F (36.6 C)] 97.9 F (36.6 C) (06/04 0548) Pulse Rate:  [44-71] 53 (06/04 0548) Resp:  [18] 18 (06/04 0548) BP: (95-115)/(35-96) 110/40 mmHg (06/04 0548) SpO2:  [93 %-97 %] 97 % (06/04 0548) Weight:  [156 lb 8.4 oz (71 kg)] 156 lb 8.4 oz (71 kg) (06/04 0548) Last BM Date: 08/27/13  Intake/Output from previous day: 06/03 0701 - 06/04 0700 In: -  Out: 200 [Urine:200] Intake/Output this shift:    Medications Current Facility-Administered Medications  Medication Dose Route Frequency Provider Last Rate Last Dose  . ALPRAZolam Duanne Moron) tablet 0.25 mg  0.25 mg Oral BID PRN Cecilie Kicks, NP   0.25 mg at 08/27/13 2231  . aspirin EC tablet 81 mg  81 mg Oral Daily Cecilie Kicks, NP      . atorvastatin (LIPITOR) tablet 40 mg  40 mg Oral QHS Cecilie Kicks, NP   40 mg at 08/28/13 2113  . cholecalciferol (VITAMIN D) tablet 2,000 Units  2,000 Units Oral Daily Cecilie Kicks, NP   2,000 Units at 08/28/13 0947  . galantamine (RAZADYNE ER) 24 hr capsule 8 mg  8 mg Oral Q breakfast Cecilie Kicks, NP   8 mg at 08/28/13 0800  . heparin injection 5,000 Units  5,000 Units Subcutaneous 3 times per day [name], MD   5,000 Units at 08/28/13 2113  . isosorbide mononitrate (IMDUR) 24 hr tablet 60 mg  60 mg Oral Daily Cecilie Kicks, NP   60 mg at 08/28/13 0947  . morphine 2 MG/ML injection 2 mg  2 mg Intravenous Q3H PRN Lesly Dukes, MD   2 mg at 08/27/13 2035  . multivitamin with minerals tablet 1 tablet  1 tablet Oral Daily Cecilie Kicks, NP   1 tablet at 08/28/13 0947  . nitroGLYCERIN (NITROSTAT) SL tablet 0.4 mg  0.4 mg Sublingual Q5 Min x 3 PRN Cecilie Kicks, NP      . ondansetron [facility]) injection 4  mg  4 mg Intravenous Q6H PRN Cecilie Kicks, NP      . pantoprazole (PROTONIX) EC tablet 40 mg  40 mg Oral BID AC Cecilie Kicks, NP   40 mg at 08/28/13 1702  . polyvinyl alcohol (LIQUIFILM TEARS) 1.4 % ophthalmic solution 1 drop  1 drop Both Eyes Daily [name], MD   1 drop at 08/28/13 1000  . silodosin (RAPAFLO) capsule 8 mg  8 mg Oral Q breakfast [name], MD   8 mg at 08/28/13 1702    PE: General appearance: alert, cooperative and no distress Lungs: clear to auscultation bilaterally Heart: regular rate and rhythm Extremities: trace bilateral pedal edema Pulses: 2+ and symmetric Skin: warm and dry Neurologic: Grossly normal  Lab Results:   Recent Labs  08/28/13 0020 08/28/13 1600 08/29/13 0432  WBC 5.3 5.7 6.5  HGB 13.2 12.9* 13.1  HCT 40.6 38.1* 39.5  PLT 99* 103* 102*   BMET  Recent Labs  08/27/13 1120 08/28/13 0020 08/28/13 1600 08/29/13 0432  NA 142 143  --  138  K 4.8 4.6  --  4.3  CL 104 107  --  105  CO2 28 27  --  22  GLUCOSE 106* 98  --  110*  BUN 25* 26*  --  26*  CREATININE 1.02 1.25 1.13 1.11  CALCIUM 10.4 9.2  --  8.9   PT/INR  Recent Labs  08/27/13 1556  LABPROT 15.3*  INR 1.24   Cholesterol  Recent Labs  09/07/2013 0020  CHOL 126   Cardiac Enzymes Cardiac Panel (last 3 results)  Recent Labs  08/27/13 1351 08/27/13 1905 2013/09/07 0020  TROPONINI <0.30 <0.30 <0.30    Studies/Results:  Diagnostic LHC 2013/09/07 IMPRESSIONS:  1. Widely patent sequential left internal graft to the diagonal/LAD.  2. Widely patent saphenous vein graft to the obtuse marginal with proximal 50% narrowing noted in the graft.  3. Total occlusion of the saphenous vein grafts to the right coronary, the ramus intermedius, and the diagonal.  4. Left ventricular dysfunction with inferobasal aneurysm and mid anterior wall region of akinesis. Overall EF is 35-40% .  5. Total occlusion of the native circulation.   Anatomy is is not significantly  changed compared to the 2006 catheterization performed by Dr. Doreatha Lew.   Assessment/Plan  Principal Problem:   Unstable angina Active Problems:   HYPERCHOLESTEROLEMIA   CORONARY ARTERY DISEASE, hx of CABG in 1989 and re-do in 1997   HYPERTENSION NEC   Bradycardia   Chest pain  1. CAD: stable. Details of LHC yesterday outlined above. Anatomy is  not significantly changed compared to the 2006 catheterization performed by Dr. Doreatha Lew. Continue medical therapy. ASA, statin and Imdur.   2. Chest Pain: Nearly continuous chest discomfort unrelieved by nitroglycerin seems unlikely to been ischemic and was possibly gastrointestinal in origin. Anatomy is is not significantly changed compared to the 2006 catheterization performed by Dr. Doreatha Lew.  3. Bradycardia: HR in the 50s. Asymptomatic. No BBs or CCBs.  4. HTN: Controlled  5: HLD: LDL at goal of <70 at 65. Continue statin.  Dispo: Home today.    MD to follow.    LOS: 2 days    Brittainy M. Ladoris Gene 08/29/2013 7:45 AM  Personally seen and examined. Agree with above. DC home.  Cath no change. Imdur Candee Furbish, MD

## 2013-08-29 NOTE — Progress Notes (Signed)
Wife states she is not taking pt home til the HA is gone, because this is new for him. I have discussed with pt and wife what PA has said. Wife wants pt to have tylenol before leaving to see if HA goes away first. [place] PA notified and order for tylenol given.

## 2013-08-29 NOTE — Discharge Summary (Signed)
Personally seen and examined. Agree with above. Candee Furbish, MD

## 2013-08-29 NOTE — Discharge Summary (Signed)
Physician Discharge Summary  Patient ID: Ronald [name] MRN: [id number] DOB/AGE: [birth year] 80 y.o.  Admit date: 08/27/2013 Discharge date: 08/29/2013  Admission Diagnoses: Unstable Angina  Discharge Diagnoses:  Principal Problem:   Unstable angina Active Problems:   HYPERCHOLESTEROLEMIA   CORONARY ARTERY DISEASE, hx of CABG in 1989 and re-do in 1997   HYPERTENSION NEC   Bradycardia   Chest pain   Discharged Condition: stable  Hospital Course: The patient is a 80 y/o male with history of CAD, s/p CABG in 1989 and redo bypass in 1997 per Dr. Redmond Pulling, nephrolithiasis, HTN, HLD, mild dementia and prostate cancer, who presented to [facility] on 08/27/13 with a complaint of chest pain. He has been followed in the past by Dr. Doreatha Lew now Dr. Angelena Form. He had a LHC in 2006 which revealed totally occluded native coronary circulation with diffuse disease in the proximal left anterior descending diagonal system.  Totally occluded saphenous vein graft to the intermediate diagonal and  saphenous vein graft to posterior descending and right coronary artery branches and a patent saphenous vein graft to the obtuse marginal with collaterals to the distal right coronary artery and patent left internal mammary graft  to the left anterior descending and diagonal systems. The decision was made, at that time, to treat medically. He had a lexiscan myoview in May 2013 because of dyspnea and fatigue with inferior scar with soft tissue attenuation and small region of anterior scar with possible small area of ischemia. This was felt to be low risk.  When he presented on 08/27/13, he had endorsed symptoms that were identical to pre-Re do bypass. However, he also had atypical features, including continuous pain for 48 hrs and no response to NTG. However, given his past cardiac history, the decision was made to admit and plan for a diagnostic LHC. Cardiac enzymes were cycled and were negative x 3. The procedure was performed by Dr.  Tamala Julian.  His anatomy was not significantly changed compared to the 2006 catheterization performed by Dr. Doreatha Lew. His LIMA- LAD was widely patent. His SVG-OM was widely patent with proximal 50% narrowing noted in the graft. There was total occlusion of the saphenous vein grafts to the right coronary, the ramus intermedius and the diagonal. There was left ventricular dysfunction with inferobasal aneurysm and mid anterior wall region of akinesis. Overall EF is 35-40% . Based on unchanged anatomy, compared to prior cath, and the fact that he had nearly continuous chest discomfort, unrelieved by nitroglycerine, it was felt that his pain was unlikely ischemic and was possibly gastrointestinal in origin. Continued medical therapy was recommended and a PPI was added. He left the cath lab in stable condition. He denied further chest pain. There were no post-cath complications. He was last seen and examined by Dr. Marlou Porch, who determined he was stable for discharge home. He is scheduled for post-hospital f/u with Ermalinda Barrios, PA-C, on 09/23/13.    Consults: None  Significant Diagnostic Studies:   Diagnostic LHC 08/28/13 IMPRESSIONS: 1. Widely patent sequential left internal graft to the diagonal/LAD.  2. Widely patent saphenous vein graft to the obtuse marginal with proximal 50% narrowing noted in the graft.  3. Total occlusion of the saphenous vein grafts to the right coronary, the ramus intermedius, and the diagonal.  4. Left ventricular dysfunction with inferobasal aneurysm and mid anterior wall region of akinesis. Overall EF is 35-40% .  5. Total occlusion of the native circulation.   Treatments: See Hospital Course  Discharge Exam: Blood pressure 110/40, pulse  53, temperature 97.9 F (36.6 C), temperature source Oral, resp. rate 18, height 5\' 5"  (1.651 m), weight 156 lb 8.4 oz (71 kg), SpO2 97.00%.   Disposition: 01-Home or Self Care      Discharge Instructions   Diet - low sodium heart healthy     Complete by:  As directed      Increase activity slowly    Complete by:  As directed             Medication List         acetaminophen 500 MG tablet  Commonly known as:  TYLENOL  Take 500 mg by mouth every 6 (six) hours as needed for pain or fever. Take 1 -2 pills     aspirin 81 MG EC tablet  Take 1 tablet (81 mg total) by mouth daily.     atorvastatin 40 MG tablet  Commonly known as:  LIPITOR  Take 40 mg by mouth at bedtime.     CALCIUM + D PO  Take 1 tablet by mouth 2 (two) times daily.     galantamine 8 MG 24 hr capsule  Commonly known as:  RAZADYNE ER  Take 8 mg by mouth daily with breakfast.     hydroxypropyl methylcellulose 2.5 % ophthalmic solution  Commonly known as:  ISOPTO TEARS  Place 1 drop into both eyes daily.     isosorbide mononitrate 60 MG 24 hr tablet  Commonly known as:  IMDUR  Take 1 tablet (60 mg total) by mouth daily.     multivitamin with minerals Tabs tablet  Take 1 tablet by mouth daily.     nitroGLYCERIN 0.4 MG SL tablet  Commonly known as:  NITROSTAT  Place 0.4 mg under the tongue every 5 (five) minutes as needed for chest pain.     pantoprazole 40 MG tablet  Commonly known as:  PROTONIX  Take 1 tablet (40 mg total) by mouth 2 (two) times daily before a meal.     RAPAFLO 8 MG Caps capsule  Generic drug:  silodosin  Take 8 mg by mouth daily with breakfast.     Vitamin D 2000 UNITS tablet  Take 2,000 Units by mouth daily.       Follow-up Information   Follow up with Ermalinda Barrios, PA-C On 09/23/2013. (1:15 pm)    Specialty:  Cardiology   Contact information:   [address] [place] [address] [phone]      TIME SPENT ON DISCHARGE, INCLUDING PHYSICIAN TIME: > 30 MINUTES Signed: Lyda Jester, PA-C 08/29/2013, 1:24 PM

## 2013-08-29 NOTE — Progress Notes (Signed)
Ellen Henri, PA notified of pt c/o of HA. She says tell pt he can leave off Protonix or only take once a day if he thinks that is what has caused HA. Also told to inform pt that the increase in Imdur my be the cause.

## 2013-09-05 DIAGNOSIS — D0439 Carcinoma in situ of skin of other parts of face: Secondary | ICD-10-CM | POA: Diagnosis not present

## 2013-09-05 DIAGNOSIS — D0430 Carcinoma in situ of skin of unspecified part of face: Secondary | ICD-10-CM | POA: Diagnosis not present

## 2013-09-17 ENCOUNTER — Ambulatory Visit (INDEPENDENT_AMBULATORY_CARE_PROVIDER_SITE_OTHER): Payer: Medicare Other | Admitting: Internal Medicine

## 2013-09-17 ENCOUNTER — Encounter: Payer: Self-pay | Admitting: Internal Medicine

## 2013-09-17 VITALS — BP 124/70 | HR 54 | Temp 98.0°F | Resp 20 | Ht 65.5 in | Wt 162.0 lb

## 2013-09-17 DIAGNOSIS — I498 Other specified cardiac arrhythmias: Secondary | ICD-10-CM | POA: Diagnosis not present

## 2013-09-17 DIAGNOSIS — Z0000 Encounter for general adult medical examination without abnormal findings: Secondary | ICD-10-CM | POA: Diagnosis not present

## 2013-09-17 DIAGNOSIS — IMO0002 Reserved for concepts with insufficient information to code with codable children: Secondary | ICD-10-CM

## 2013-09-17 DIAGNOSIS — Z87442 Personal history of urinary calculi: Secondary | ICD-10-CM | POA: Diagnosis not present

## 2013-09-17 DIAGNOSIS — Z8546 Personal history of malignant neoplasm of prostate: Secondary | ICD-10-CM

## 2013-09-17 DIAGNOSIS — E7800 Pure hypercholesterolemia, unspecified: Secondary | ICD-10-CM

## 2013-09-17 DIAGNOSIS — I2510 Atherosclerotic heart disease of native coronary artery without angina pectoris: Secondary | ICD-10-CM

## 2013-09-17 DIAGNOSIS — M1990 Unspecified osteoarthritis, unspecified site: Secondary | ICD-10-CM

## 2013-09-17 DIAGNOSIS — R001 Bradycardia, unspecified: Secondary | ICD-10-CM

## 2013-09-17 NOTE — Progress Notes (Signed)
Subjective:    Patient ID: Ronald [name], male    DOB: 01/27/1928, 80 y.o.   MRN: [id number]  HPI  History of Present Illness:   59 -year-old patient seen today for an annual exam;  clinically doing quite well. Has a history of coronary artery disease, followed by cardiology. No cardiopulmonary complaints. He was hospitalized earlier this month for unstable angina and underwent heart catheterization.  He is also followed closely by urology.Marland Kitchen  He is followed at the [facility] as well   Current Allergies:  ! CIPRO  ! SULFA   Past Medical History:  Prostate cancer, hx of 1998  Coronary artery disease status post coronary bypass grafting, 1989, 1998  DJD  Hypercholesterolemia  Nephrolithiasis, hx of  Osteoarthritis   Past Surgical History:  Coronary artery bypass graft-1989, 1998  status post resection melanoma of the ear  status post laparoscopic cholecystectomy September 2007  Cataract extraction  Heart catheterization, June 2015  Family History:  Family History Diabetes 1st degree relative  Fam hx MI  Fam hx CAD  father died age 60. History tuberculosis and diabetes  mother died age 41 on artery disease, diabetes  One brother died age 14 of an MI  4 sisters positive for coronary disease   Review of Systems  sigmoidoscopy 2006  Cardiolite stress test November 2005  heart catheterization November 2006  , June 2015  Medicare wellness  1. Risk factors, based on past  M,S,F history patient has known coronary artery disease.  Cardiovascular is factors include dyslipidemia  2.  Physical activities: Fairly inactive, but no exercise restrictions  3.  Depression/mood: No history of depression or mood disorder  4.  Hearing: Mild deficits  5.  ADL's: Independent in all aspects of daily living  6.  Fall risk: Mildly increased due to unsteady gait  7.  Home safety: No problems identified  8.  Height weight, and visual acuity; height and weight stable.  No  change in visual acuity  9.  Counseling: Heart healthy diet.  Encouraged  10. Lab orders based on risk factors: Recent hospital records reviewed including lab  11. Referral : Followup urology and cardiology  12. Care plan: Continue aggressive risk factor modification  13. Cognitive assessment: Alert, and oriented with normal affect.  No cognitive dysfunction    Review of Systems  Constitutional: Negative for fever, chills, activity change, appetite change and fatigue.  HENT: Negative for congestion, dental problem, ear pain, hearing loss, mouth sores, rhinorrhea, sinus pressure, sneezing, tinnitus, trouble swallowing and voice change.   Eyes: Negative for photophobia, pain, redness and visual disturbance.  Respiratory: Negative for apnea, cough, choking, chest tightness, shortness of breath and wheezing.   Cardiovascular: Negative for chest pain, palpitations and leg swelling.  Gastrointestinal: Negative for nausea, vomiting, abdominal pain, diarrhea, constipation, blood in stool, abdominal distention, anal bleeding and rectal pain.  Genitourinary: Negative for dysuria, urgency, frequency, hematuria, flank pain, decreased urine volume, discharge, penile swelling, scrotal swelling, difficulty urinating, genital sores and testicular pain.  Musculoskeletal: Positive for gait problem (states that he has a tendency to fall to the left). Negative for arthralgias, back pain, joint swelling, myalgias, neck pain and neck stiffness.  Skin: Negative for color change, rash and wound.  Neurological: Negative for dizziness, tremors, seizures, syncope, facial asymmetry, speech difficulty, weakness, light-headedness, numbness and headaches.  Hematological: Negative for adenopathy. Does not bruise/bleed easily.  Psychiatric/Behavioral: Negative for suicidal ideas, hallucinations, behavioral problems, confusion, sleep disturbance, self-injury, dysphoric mood, decreased  concentration and agitation. The  patient is not nervous/anxious.        Objective:   Physical Exam  Constitutional: He appears well-developed and well-nourished.  HENT:  Head: Normocephalic and atraumatic.  Right Ear: External ear normal.  Left Ear: External ear normal.  Nose: Nose normal.  Mouth/Throat: Oropharynx is clear and moist.  Eyes: Conjunctivae and EOM are normal. Pupils are equal, round, and reactive to light. No scleral icterus.  Neck: Normal range of motion. Neck supple. No JVD present. No thyromegaly present.  Cardiovascular: Normal heart sounds and intact distal pulses.  Exam reveals no gallop and no friction rub.   No murmur heard. Posterior tibial pulses.  Full Left dorsalis pedis pulse faint Right dorsalis pedis pulses not easily palpable  Rate is slow with frequent ectopics  Pulmonary/Chest: Effort normal and breath sounds normal. He exhibits no tenderness.  Abdominal: Soft. Bowel sounds are normal. He exhibits no distension and no mass. There is no tenderness.  Genitourinary: Penis normal.  Musculoskeletal: Normal range of motion. He exhibits edema. He exhibits no tenderness.  Mild pedal edema  Lymphadenopathy:    He has no cervical adenopathy.  Neurological: He is alert. He has normal reflexes. No cranial nerve deficit. Coordination normal.  Skin she notes that sitting and heel-to-shin testing normal Gait somewhat unsteady, but not grossly ataxic Unable to do a tandem walk well  Skin: Skin is warm and dry. No rash noted.  Psychiatric: He has a normal mood and affect. His behavior is normal.          Assessment & Plan:   Preventive health examination Coronary artery disease.  Followup cardiology.  Continue aggressive risk factor modification.  Dyslipidemia.  Continue atorvastatin History of nephrolithiasis and prostate cancer.  Followup urology  Recheck 6 months

## 2013-09-17 NOTE — Progress Notes (Signed)
Pre-visit discussion using our clinic review tool. No additional management support is needed unless otherwise documented below in the visit note.  

## 2013-09-17 NOTE — Patient Instructions (Signed)
Limit your sodium (Salt) intake    It is important that you exercise regularly, at least 20 minutes 3 to 4 times per week.  If you develop chest pain or shortness of breath seek  medical attention.  Return in 6 months for follow-up  Followup cardiology and urology

## 2013-09-18 ENCOUNTER — Telehealth: Payer: Self-pay | Admitting: Internal Medicine

## 2013-09-18 NOTE — Telephone Encounter (Signed)
Relevant patient education mailed to patient.  

## 2013-09-23 ENCOUNTER — Ambulatory Visit (INDEPENDENT_AMBULATORY_CARE_PROVIDER_SITE_OTHER): Payer: Medicare Other | Admitting: Physician Assistant

## 2013-09-23 ENCOUNTER — Encounter: Payer: Self-pay | Admitting: Physician Assistant

## 2013-09-23 VITALS — BP 117/52 | HR 63 | Ht 65.5 in | Wt 161.0 lb

## 2013-09-23 DIAGNOSIS — I519 Heart disease, unspecified: Secondary | ICD-10-CM | POA: Diagnosis not present

## 2013-09-23 DIAGNOSIS — I2510 Atherosclerotic heart disease of native coronary artery without angina pectoris: Secondary | ICD-10-CM

## 2013-09-23 DIAGNOSIS — IMO0002 Reserved for concepts with insufficient information to code with codable children: Secondary | ICD-10-CM

## 2013-09-23 MED ORDER — ATORVASTATIN CALCIUM 80 MG PO TABS: 40.0000 mg | ORAL_TABLET | Freq: Every day | ORAL | Status: DC

## 2013-09-23 NOTE — Assessment & Plan Note (Signed)
Controlled.  

## 2013-09-23 NOTE — Assessment & Plan Note (Signed)
Patient was admitted to the hospital with chest pain and repeat cardiac catheterization showed no significant change from prior cath. Patient has had no further chest pain and it was felt it was probably GI in origin. Doing well on Zantac. No further medication adjustments. Followup with Dr.McAlhany in 2 months.

## 2013-09-23 NOTE — Assessment & Plan Note (Signed)
Patient's EF is 35-40% on recent cath. No evidence of heart failure.

## 2013-09-23 NOTE — Patient Instructions (Signed)
Your physician recommends that you schedule a follow-up appointment in: WITH DR. Angelena Form IN 2-3 MONTHS   Your physician recommends that you continue on your current medications as directed. Please refer to the Current Medication list given to you today.

## 2013-09-23 NOTE — Progress Notes (Signed)
HPI: This is an 80 year old male patient of Dr. Angelena Form who has history of coronary artery disease status post CABG in 1989 and redo CABG in 1997. He also has not pro lithiasis, hypertension, hyperlipidemia, mild dementia and prostate cancer. He presented to [facility] in 08/27/13 with chest pain. He underwent cardiac catheterization which showed a patent LIMA to the LAD, patent SVG to the OM with proximal 50% narrowing in the graft. There was total occlusion of the SVG to the RCA, ramus intermedius, and diagonal. There was LV dysfunction with inferior basal aneurysm and mid anterior wall region of akinesis EF 35-40%. It was felt his anatomy was unchanged compared to prior cath. His chest discomfort was continuous and on relieved with nitroglycerin and was felt to be GI in origin. He was third on Zantac.  The patient has had no further chest pain since discharge. He complains of weakness but overall feels well. He denies dyspnea, dyspnea on exertion, dizziness or presyncope. He is out of his Lipitor and would like a refill to hold him over until he gets his [place] order.   Allergies-- Pantoprazole -- Other (See Comments)   --  Headache and lightheaded  -- Ciprofloxacin -- Swelling and Hives   --  Allergy classified as swelling here, but pt says            Cipro just doesn't work for him  -- Nitrofurantoin -- Hives   --  Hives that lasted for weeks- he had to go to the            hospital  -- Sulfonamide Derivatives -- Hives   --    -- Sulfamethoxazole -- Hives  Current Outpatient Prescriptions on File Prior to Visit: acetaminophen (TYLENOL) 500 MG tablet, Take 500 mg by mouth every 6 (six) hours as needed for pain or fever. Take 1 -2 pills, Disp: , Rfl:  aspirin EC 81 MG EC tablet, Take 1 tablet (81 mg total) by mouth daily., Disp: 30 tablet, Rfl: 0 atorvastatin (LIPITOR) 40 MG tablet, Take 40 mg by mouth at bedtime. , Disp: , Rfl:  Calcium Carbonate-Vitamin D (CALCIUM + D PO), Take 1 tablet by  mouth 2 (two) times daily. , Disp: , Rfl:  Cholecalciferol (VITAMIN D) 2000 UNITS tablet, Take 2,000 Units by mouth daily., Disp: , Rfl:  galantamine (RAZADYNE ER) 8 MG 24 hr capsule, Take 8 mg by mouth daily with breakfast., Disp: , Rfl:  hydroxypropyl methylcellulose (ISOPTO TEARS) 2.5 % ophthalmic solution, Place 1 drop into both eyes daily., Disp: , Rfl:  isosorbide mononitrate (IMDUR) 60 MG 24 hr tablet, Take 1 tablet (60 mg total) by mouth daily., Disp: 30 tablet, Rfl: 5 Multiple Vitamin (MULTIVITAMIN WITH MINERALS) TABS, Take 1 tablet by mouth daily., Disp: , Rfl:  nitroGLYCERIN (NITROSTAT) 0.4 MG SL tablet, Place 0.4 mg under the tongue every 5 (five) minutes as needed for chest pain. , Disp: , Rfl:  Ranitidine HCl (ZANTAC PO), Take 1 tablet by mouth as needed (OTC)., Disp: , Rfl:  silodosin (RAPAFLO) 8 MG CAPS capsule, Take 8 mg by mouth daily with breakfast., Disp: , Rfl:   No current facility-administered medications on file prior to visit.   Past Medical History:   Coronary artery disease                                        Comment:11/89 CABG  (70% circ;  90% PD; 60-70% distal               left main; 30% left circ; 90% 1st diag;, 2nd               diag and 3rd diag. w/90% mild LAD stenosis and               60-70% prox. stenosisin the LAD)   Kidney stone                                                   Comment:1970   S/P T&A (status post tonsillectomy and adenoid*                Comment:1954   Cancer                                                         Comment:prostate cancer and seed implant 1998   Hyperlipidemia                                               Dementia                                                       Comment:mild   Normal nuclear stress test                      July 2011    Myocardial infarction                                        E coli bacteremia                                              Comment:HX OF E COLI  Past Surgical History:    CORONARY ARTERY BYPASS GRAFT                                    Comment:11/89 CABG  (70% circ; 90% PD; 60-70% distal               left main; 30% left circ; 90% 1st diag;, 2nd               diag and 3rd diag. w/90% mild LAD stenosis and               60-70% prox. stenosisin the LAD. Redo bypass in              [place]  CHOLECYSTECTOMY                                               EUS                                              10/05/2011      Comment:Procedure: ESOPHAGEAL ENDOSCOPIC ULTRASOUND               (EUS) RADIAL;  Surgeon: Arta Silence, MD;                Location: [facility];  Service: Endoscopy;                Laterality: N/A;   RADIOACTIVE SEED IMPLANT                         1997           Comment:PROSTATE  Review of patient's family history indicates:   Heart disease                  Mother                   Diabetes                       Father                   Heart disease                  Brother                  Social History   Marital Status: Married             Spouse Name:                      Years of Education:                 Number of children:             Occupational History   None on file  Social History Main Topics   Smoking Status: Former Smoker                   Packs/Day: 0.00  Years:         Smokeless Status: Current User                       Types: Chew   Alcohol Use: No             Drug Use: No             Sexual Activity: Not on file        Other Topics            Concern   None on file  Social History Narrative   None on file    ROS: See history of present illness otherwise negative   PHYSICAL EXAM: Well-nournished, in no acute distress. Neck: No JVD, HJR, Bruit, or thyroid enlargement  Lungs: No tachypnea, clear without wheezing, rales, or rhonchi  Cardiovascular: RRR, PMI not displaced, heart sounds distant,  2/6 systolic murmur at the left sternal border, no  gallops, bruit, thrill, or heave.  Abdomen: BS normal. Soft without organomegaly, masses, lesions or tenderness.  Extremities: without cyanosis, clubbing or edema. Good distal pulses bilateral  SKin: Warm, no lesions or rashes   Musculoskeletal: No deformities  Neuro: no focal signs  BP 117/52  Pulse 63  Ht 5' 5.5" (1.664 m)  Wt 161 lb (73.029 kg)  BMI 26.37 kg/m2   EKG: Normal sinus rhythm with PACs inferior Q waves  LEFT VENTRICULOGRAM:  Left ventricular angiogram was done in the 30 RAO projection and revealed  a well formed inferoposterior aneurysm. The LV cavity is dilated. A focal portion of the mid anterior wall is akinetic. Overall LV function reveals an EF of 35-40%. No obvious mitral regurgitation is noted although power injection was not performed and mild regurgitation could be missed.   IMPRESSIONS:  1. Widely patent sequential left internal graft to the diagonal/LAD.  2. Widely patent saphenous vein graft to the obtuse marginal with proximal 50% narrowing noted in the graft.  3. Total occlusion of the saphenous vein grafts to the right coronary, the ramus intermedius, and the diagonal.  4. Left ventricular dysfunction with inferobasal aneurysm and mid anterior wall region of akinesis. Overall EF is 35-40% .  5. Total occlusion of the native circulation.   RECOMMENDATION:  Nearly continuous chest discomfort unrelieved by nitroglycerin seems unlikely to been ischemic and was possibly gastrointestinal in origin. Anatomy is is not significantly changed compared to the 2006 catheterization performed by Dr. Doreatha Lew.  Plan conservative medical management. Perhaps attention to possible gastrointestinal source of pain can be addressed.  Decreased left ventricular function raises the possibility of future heart failure/sudden death. Therefore intensification/optimization of heart failure regimen and/or device therapy should be discussed. Device therapy may not be prudent  given the patient's age.

## 2013-10-22 DIAGNOSIS — N3020 Other chronic cystitis without hematuria: Secondary | ICD-10-CM | POA: Diagnosis not present

## 2013-10-22 DIAGNOSIS — N318 Other neuromuscular dysfunction of bladder: Secondary | ICD-10-CM | POA: Diagnosis not present

## 2013-10-22 DIAGNOSIS — C61 Malignant neoplasm of prostate: Secondary | ICD-10-CM | POA: Diagnosis not present

## 2013-11-27 DIAGNOSIS — R350 Frequency of micturition: Secondary | ICD-10-CM | POA: Diagnosis not present

## 2013-11-27 DIAGNOSIS — R310 Gross hematuria: Secondary | ICD-10-CM | POA: Diagnosis not present

## 2013-11-27 DIAGNOSIS — C61 Malignant neoplasm of prostate: Secondary | ICD-10-CM | POA: Diagnosis not present

## 2013-12-09 ENCOUNTER — Ambulatory Visit (INDEPENDENT_AMBULATORY_CARE_PROVIDER_SITE_OTHER): Payer: Medicare Other | Admitting: Cardiovascular Disease

## 2013-12-09 ENCOUNTER — Encounter: Payer: Self-pay | Admitting: Cardiovascular Disease

## 2013-12-09 VITALS — BP 98/52 | HR 58 | Ht 65.5 in | Wt 162.1 lb

## 2013-12-09 DIAGNOSIS — E785 Hyperlipidemia, unspecified: Secondary | ICD-10-CM

## 2013-12-09 DIAGNOSIS — I255 Ischemic cardiomyopathy: Secondary | ICD-10-CM

## 2013-12-09 DIAGNOSIS — I10 Essential (primary) hypertension: Secondary | ICD-10-CM

## 2013-12-09 DIAGNOSIS — I2589 Other forms of chronic ischemic heart disease: Secondary | ICD-10-CM | POA: Diagnosis not present

## 2013-12-09 DIAGNOSIS — I2510 Atherosclerotic heart disease of native coronary artery without angina pectoris: Secondary | ICD-10-CM

## 2013-12-09 MED ORDER — ISOSORBIDE MONONITRATE ER 30 MG PO TB24: 30.0000 mg | ORAL_TABLET | Freq: Every day | ORAL | Status: DC

## 2013-12-09 NOTE — Progress Notes (Signed)
History of Present Illness: 80 yo male with history of CAD s/p CABG in 1989 and redo bypass in 1997 per Dr. Redmond Pulling, nephrolithiasis, HTN, HLD, mild dementia, prostate cancer here today for cardiac follow up. He has been followed in the past by Dr. Doreatha Lew. Leane Call May 2013 because of dyspnea and fatigue. This showed inferior scar with soft tissue attenuation and small region of anterior scar with possible small area of ischemia. This was felt to be low risk. He has been managed conservatively. He was seen in November 2014 and c/o fatigue and was hypotensive. Lisinopril was stopped and this resolved. Admitted to [facility] June 2015 with chest pain. Cardiac cath 08/28/13 with 95% left main stenosis, occluded LAD, occluded Circumflex, occluded RCA. Patent LIMA to LAD, patent SVG to OM supplying collaterals to RCA. Occluded SVG to RCA and SVG to intermediate branch. Medical therapy recommended. LVEF=35-40% by LVgram. His pain resolved with addition of Zantac, felt to be GI related.   He is here today for follow up. He has no exertional chest pain or pressure. He has been working in the yard with no chest pain. No dizziness.   Primary Care Physician: Dr. Burnice Logan  Last Lipid Profile:Lipid Panel     Component Value Date/Time   CHOL 126 08/28/2013 0020   TRIG 34 08/28/2013 0020   HDL 54 08/28/2013 0020   CHOLHDL 2.3 08/28/2013 0020   VLDL 7 08/28/2013 0020   LDLCALC 65 08/28/2013 0020    Past Medical History  Diagnosis Date  . Coronary artery disease     11/89 CABG  (70% circ; 90% PD; 60-70% distal left main; 30% left circ; 90% 1st diag;, 2nd diag and 3rd diag. w/90% mild LAD stenosis and 60-70% prox. stenosisin the LAD)  . Kidney stone     1970  . S/P T&A (status post tonsillectomy and adenoidectomy)     1954  . Cancer     prostate cancer and seed implant 1998  . Hyperlipidemia   . Dementia     mild  . Normal nuclear stress test July 2011  . Myocardial infarction   . E coli bacteremia    HX OF E COLI    Past Surgical History  Procedure Laterality Date  . Coronary artery bypass graft      11/89 CABG  (70% circ; 90% PD; 60-70% distal left main; 30% left circ; 90% 1st diag;, 2nd diag and 3rd diag. w/90% mild LAD stenosis and 60-70% prox. stenosisin the LAD. Redo bypass in 1997  . Tonsillectomy    . Cholecystectomy    . Eus  10/05/2011    Procedure: ESOPHAGEAL ENDOSCOPIC ULTRASOUND (EUS) RADIAL;  Surgeon: Arta Silence, MD;  Location: [facility];  Service: Endoscopy;  Laterality: N/A;  . Radioactive seed implant  1997    PROSTATE    Current Outpatient Prescriptions  Medication Sig Dispense Refill  . acetaminophen (TYLENOL) 500 MG tablet Take 500 mg by mouth every 6 (six) hours as needed for pain or fever. Take 1 -2 pills      . aspirin EC 81 MG EC tablet Take 1 tablet (81 mg total) by mouth daily.  30 tablet  0  . atorvastatin (LIPITOR) 80 MG tablet Take 0.5 tablets (40 mg total) by mouth daily.  14 tablet  0  . Calcium Carbonate-Vitamin D (CALCIUM + D PO) Take 1 tablet by mouth 2 (two) times daily.       . Cholecalciferol (VITAMIN D) 2000 UNITS tablet Take  2,000 Units by mouth daily.      Marland Kitchen galantamine (RAZADYNE ER) 8 MG 24 hr capsule Take 8 mg by mouth daily with breakfast.      . hydroxypropyl methylcellulose (ISOPTO TEARS) 2.5 % ophthalmic solution Place 1 drop into both eyes daily.      . isosorbide mononitrate (IMDUR) 60 MG 24 hr tablet Take 1 tablet (60 mg total) by mouth daily.  30 tablet  5  . Multiple Vitamin (MULTIVITAMIN WITH MINERALS) TABS Take 1 tablet by mouth daily.      . nitroGLYCERIN (NITROSTAT) 0.4 MG SL tablet Place 0.4 mg under the tongue every 5 (five) minutes as needed for chest pain.       . Ranitidine HCl (ZANTAC PO) Take 1 tablet by mouth as needed (OTC).      Marland Kitchen silodosin (RAPAFLO) 8 MG CAPS capsule Take 8 mg by mouth daily with breakfast.       No current facility-administered medications for this visit.    Allergies  Allergen Reactions  .  Pantoprazole Other (See Comments)    Headache and lightheaded  . Ciprofloxacin Swelling and Hives    Allergy classified as swelling here, but pt says Cipro just doesn't work for him    . Nitrofurantoin Hives    Hives that lasted for weeks- he had to go to the hospital   . Sulfonamide Derivatives Hives       . Sulfamethoxazole Hives    History   Social History  . Marital Status: Married    Spouse Name: N/A    Number of Children: N/A  . Years of Education: N/A   Occupational History  . Not on file.   Social History Main Topics  . Smoking status: Former Research scientist (life sciences)  . Smokeless tobacco: Current User    Types: Chew  . Alcohol Use: No  . Drug Use: No  . Sexual Activity: Not on file   Other Topics Concern  . Not on file   Social History Narrative  . No narrative on file    Family History  Problem Relation Age of Onset  . Heart disease Mother   . Diabetes Father   . Heart disease Brother     Review of Systems:  As stated in the HPI and otherwise negative.   BP 98/52  Pulse 58  Ht 5' 5.5" (1.664 m)  Wt 162 lb 1.9 oz (73.537 kg)  BMI 26.56 kg/m2  Physical Examination: General: Well developed, well nourished, NAD HEENT: OP clear, mucus membranes moist SKIN: warm, dry. No rashes. Neuro: No focal deficits Musculoskeletal: Muscle strength 5/5 all ext Psychiatric: Mood and affect normal Neck: No JVD, no carotid bruits, no thyromegaly, no lymphadenopathy. Lungs:Clear bilaterally, no wheezes, rhonci, crackles Cardiovascular: Regular rate and rhythm. No murmurs, gallops or rubs. Abdomen:Soft. Bowel sounds present. Non-tender.  Extremities: No lower extremity edema. Pulses are 2 + in the bilateral DP/PT.  EKG:   Cardiac cath 08/28/13: The left main coronary artery is is severely involved distally with a string-like obstruction of at least 95%. This vessel is heavily calcified..  The left anterior descending artery is totally occluded..  The left circumflex artery is  totally occluded.  The right coronary artery is totally occluded proximally. Right to right homocollaterals and noted to feel a right ventricular branch.Marland Kitchen  BYPASS GRAFT ANGIOGRAPHY: The saphenous vein graft to the obtuse marginal is widely patent there is proximal segmental 30-50% narrowing. The obtuse marginal territory supplies collaterals to the distal right coronary.  The saphenous vein graft to the right coronary is totally occluded  The saphenous vein graft to the ramus/diagonal is totally occluded  The left internal mammary graft to the LAD and diagonal is widely patent  LEFT VENTRICULOGRAM: Left ventricular angiogram was done in the 30 RAO projection and revealed a well formed inferoposterior aneurysm. The LV cavity is dilated. A focal portion of the mid anterior wall is akinetic. Overall LV function reveals an EF of 35-40%. No obvious mitral regurgitation is noted although power injection was not performed and mild regurgitation could be missed.  IMPRESSIONS: 1. Widely patent sequential left internal graft to the diagonal/LAD.  2. Widely patent saphenous vein graft to the obtuse marginal with proximal 50% narrowing noted in the graft.  3. Total occlusion of the saphenous vein grafts to the right coronary, the ramus intermedius, and the diagonal.  4. Left ventricular dysfunction with inferobasal aneurysm and mid anterior wall region of akinesis. Overall EF is 35-40% .  5. Total occlusion of the native circulation.   Assessment and Plan:   1. CORONARY ARTERY DISEASE: Stable. Cardiac cath June 2015 with stable anatomy. Continue statin, ASA. He has been hypotensive and weak since his Imdur was increased. Will lower Imdur to 30 mg per day. No beta blocker secondary to bradycardia. No Ace-inh secondary to hypotension.   2. Hyperlipidemia: Continue statin. Lipids well controlled.   3. HTN: BP well controlled. He has been hypotensive.    4. Ischemic cardiomyopathy: LVEF=35-40% by LV gram. He  does not tolerate Ace-inh or beta blockers.   5. GERD: He is taking Tums. He uses Zantac prn.

## 2013-12-09 NOTE — Patient Instructions (Addendum)
Your physician wants you to follow-up in: 6 months.  You will receive a reminder letter in the mail two months in advance. If you don't receive a letter, please call our office to schedule the follow-up appointment.   Your physician has recommended you make the following change in your medication:  Decrease Imdur to 30 mg by mouth daily.

## 2013-12-10 DIAGNOSIS — R310 Gross hematuria: Secondary | ICD-10-CM | POA: Diagnosis not present

## 2013-12-10 DIAGNOSIS — R319 Hematuria, unspecified: Secondary | ICD-10-CM | POA: Diagnosis not present

## 2013-12-19 DIAGNOSIS — N35919 Unspecified urethral stricture, male, unspecified site: Secondary | ICD-10-CM | POA: Diagnosis not present

## 2013-12-19 DIAGNOSIS — C61 Malignant neoplasm of prostate: Secondary | ICD-10-CM | POA: Diagnosis not present

## 2013-12-19 DIAGNOSIS — N3020 Other chronic cystitis without hematuria: Secondary | ICD-10-CM | POA: Diagnosis not present

## 2013-12-19 DIAGNOSIS — R310 Gross hematuria: Secondary | ICD-10-CM | POA: Diagnosis not present

## 2013-12-20 ENCOUNTER — Telehealth: Payer: Self-pay | Admitting: *Deleted

## 2013-12-20 ENCOUNTER — Other Ambulatory Visit: Payer: Self-pay | Admitting: Urology

## 2013-12-20 ENCOUNTER — Encounter (HOSPITAL_BASED_OUTPATIENT_CLINIC_OR_DEPARTMENT_OTHER): Payer: Self-pay | Admitting: *Deleted

## 2013-12-20 NOTE — Progress Notes (Signed)
REVIEWED CHART W/ DR JUDD MDA , STATES OK PROCEED AT OUR FACILITY WITH CARDIAC CLEARANCE.

## 2013-12-20 NOTE — Telephone Encounter (Signed)
Received fax from [facility] requesting to know if pt can stop Aspirin today.  Since Dr. Julianne Handler not in office today checked with Dr. Stanford Breed who approved him stopping ASA today.  They also need cardiac clearance for surgery 9/30 and will give this to Alric Seton to follow up on Monday with Dr. Julianne Handler.  Notified pt and wife for him to stop ASA today and the clearance will be done the first of the week.

## 2013-12-23 ENCOUNTER — Encounter (HOSPITAL_BASED_OUTPATIENT_CLINIC_OR_DEPARTMENT_OTHER): Payer: Self-pay | Admitting: *Deleted

## 2013-12-23 NOTE — Telephone Encounter (Signed)
Reviewed with Dr. Burt Knack (Dr. Angelena Form off today) and pt may proceed with surgery. Clearance form completed. Will fax form, this phone note and last office visit note to [facility]. Message left with this information on Chasity's voicemail at [facility].

## 2013-12-23 NOTE — Telephone Encounter (Signed)
Thanks. cdm 

## 2013-12-23 NOTE — Progress Notes (Signed)
12/23/13 1117  OBSTRUCTIVE SLEEP APNEA  Have you ever been diagnosed with sleep apnea through a sleep study? No  Do you snore loudly (loud enough to be heard through closed doors)?  0  Do you often feel tired, fatigued, or sleepy during the daytime? 0  Has anyone observed you stop breathing during your sleep? 0  Do you have, or are you being treated for high blood pressure? 1  BMI more than 35 kg/m2? 0  Age over 78 years old? 1  Neck circumference greater than 40 cm/16 inches? 1  Gender: 1  Obstructive Sleep Apnea Score 4  Score 4 or greater  Results sent to PCP

## 2013-12-23 NOTE — Progress Notes (Addendum)
SPOKE W/ WIFE.  NPO AFTER MN.  ARRIVE AT [id number]. NEEDS ISTAT. CURRENT EKG IN CHART AND EPIC. WILL TAKE IMDUR, ZANTAC, AND LIPITOR AM DOS W/ SIPS OF WATER.  PER NOTE IN EPIC PT HAS CARDIAC CLEARANCE.

## 2013-12-25 ENCOUNTER — Ambulatory Visit (HOSPITAL_BASED_OUTPATIENT_CLINIC_OR_DEPARTMENT_OTHER)
Admission: RE | Admit: 2013-12-25 | Discharge: 2013-12-25 | Disposition: A | Discharge status: Home / Self Care | Payer: Medicare Other | Source: Ambulatory Visit | Attending: Urology | Admitting: Urology

## 2013-12-25 ENCOUNTER — Encounter (HOSPITAL_BASED_OUTPATIENT_CLINIC_OR_DEPARTMENT_OTHER): Payer: Medicare Other | Admitting: Anesthesiology

## 2013-12-25 ENCOUNTER — Ambulatory Visit (HOSPITAL_BASED_OUTPATIENT_CLINIC_OR_DEPARTMENT_OTHER): Payer: Medicare Other | Admitting: Anesthesiology

## 2013-12-25 ENCOUNTER — Encounter (HOSPITAL_BASED_OUTPATIENT_CLINIC_OR_DEPARTMENT_OTHER)
Admission: RE | Disposition: A | Discharge status: Home / Self Care | Payer: Self-pay | Source: Ambulatory Visit | Attending: Urology

## 2013-12-25 ENCOUNTER — Encounter (HOSPITAL_BASED_OUTPATIENT_CLINIC_OR_DEPARTMENT_OTHER): Payer: Self-pay

## 2013-12-25 DIAGNOSIS — N35919 Unspecified urethral stricture, male, unspecified site: Secondary | ICD-10-CM | POA: Diagnosis not present

## 2013-12-25 DIAGNOSIS — I10 Essential (primary) hypertension: Secondary | ICD-10-CM | POA: Insufficient documentation

## 2013-12-25 DIAGNOSIS — Z888 Allergy status to other drugs, medicaments and biological substances status: Secondary | ICD-10-CM | POA: Diagnosis not present

## 2013-12-25 DIAGNOSIS — K219 Gastro-esophageal reflux disease without esophagitis: Secondary | ICD-10-CM | POA: Insufficient documentation

## 2013-12-25 DIAGNOSIS — Z7982 Long term (current) use of aspirin: Secondary | ICD-10-CM | POA: Insufficient documentation

## 2013-12-25 DIAGNOSIS — Z8546 Personal history of malignant neoplasm of prostate: Secondary | ICD-10-CM | POA: Diagnosis not present

## 2013-12-25 DIAGNOSIS — I2589 Other forms of chronic ischemic heart disease: Secondary | ICD-10-CM | POA: Insufficient documentation

## 2013-12-25 DIAGNOSIS — Z881 Allergy status to other antibiotic agents status: Secondary | ICD-10-CM | POA: Insufficient documentation

## 2013-12-25 DIAGNOSIS — Z87442 Personal history of urinary calculi: Secondary | ICD-10-CM | POA: Diagnosis not present

## 2013-12-25 DIAGNOSIS — Z79899 Other long term (current) drug therapy: Secondary | ICD-10-CM | POA: Insufficient documentation

## 2013-12-25 DIAGNOSIS — I2510 Atherosclerotic heart disease of native coronary artery without angina pectoris: Secondary | ICD-10-CM | POA: Insufficient documentation

## 2013-12-25 DIAGNOSIS — Z9861 Coronary angioplasty status: Secondary | ICD-10-CM | POA: Diagnosis not present

## 2013-12-25 DIAGNOSIS — IMO0002 Reserved for concepts with insufficient information to code with codable children: Secondary | ICD-10-CM | POA: Diagnosis not present

## 2013-12-25 DIAGNOSIS — Z87891 Personal history of nicotine dependence: Secondary | ICD-10-CM | POA: Insufficient documentation

## 2013-12-25 DIAGNOSIS — N210 Calculus in bladder: Secondary | ICD-10-CM | POA: Diagnosis not present

## 2013-12-25 DIAGNOSIS — I252 Old myocardial infarction: Secondary | ICD-10-CM | POA: Diagnosis not present

## 2013-12-25 DIAGNOSIS — I9589 Other hypotension: Secondary | ICD-10-CM | POA: Insufficient documentation

## 2013-12-25 DIAGNOSIS — N3020 Other chronic cystitis without hematuria: Secondary | ICD-10-CM | POA: Diagnosis not present

## 2013-12-25 DIAGNOSIS — Z882 Allergy status to sulfonamides status: Secondary | ICD-10-CM | POA: Diagnosis not present

## 2013-12-25 DIAGNOSIS — M1990 Unspecified osteoarthritis, unspecified site: Secondary | ICD-10-CM | POA: Diagnosis not present

## 2013-12-25 DIAGNOSIS — R310 Gross hematuria: Secondary | ICD-10-CM | POA: Diagnosis not present

## 2013-12-25 HISTORY — DX: Personal history of other diseases of the nervous system and sense organs: Z86.69

## 2013-12-25 HISTORY — DX: Presence of dental prosthetic device (complete) (partial): Z97.2

## 2013-12-25 HISTORY — DX: Presence of external hearing-aid: Z97.4

## 2013-12-25 HISTORY — DX: Unspecified osteoarthritis, unspecified site: M19.90

## 2013-12-25 HISTORY — DX: Ischemic cardiomyopathy: I25.5

## 2013-12-25 HISTORY — DX: Other specified personal risk factors, not elsewhere classified: Z91.89

## 2013-12-25 HISTORY — DX: Other chronic cystitis without hematuria: N30.20

## 2013-12-25 HISTORY — DX: Presence of aortocoronary bypass graft: Z95.1

## 2013-12-25 HISTORY — DX: Unspecified urethral stricture, male, unspecified site: N35.919

## 2013-12-25 HISTORY — DX: Overactive bladder: N32.81

## 2013-12-25 HISTORY — PX: CYSTOSCOPY WITH URETHRAL DILATATION: SHX5125

## 2013-12-25 HISTORY — DX: Gastro-esophageal reflux disease without esophagitis: K21.9

## 2013-12-25 HISTORY — DX: Unspecified dementia without behavioral disturbance: F03.90

## 2013-12-25 HISTORY — DX: Unspecified dementia, mild, without behavioral disturbance, psychotic disturbance, mood disturbance, and anxiety: F03.A0

## 2013-12-25 LAB — POCT I-STAT 4, (NA,K, GLUC, HGB,HCT)
Glucose, Bld: 106 mg/dL — ABNORMAL HIGH (ref 70–99)
HCT: 43 % (ref 39.0–52.0)
Hemoglobin: 14.6 g/dL (ref 13.0–17.0)
Potassium: 4 mEq/L (ref 3.7–5.3)
Sodium: 140 mEq/L (ref 137–147)

## 2013-12-25 SURGERY — CYSTOSCOPY, WITH URETHRAL DILATION
Anesthesia: General | Site: Bladder

## 2013-12-25 MED ORDER — LIDOCAINE HCL (CARDIAC) 20 MG/ML IV SOLN
INTRAVENOUS | Status: DC | PRN
  Administered 2013-12-25: 50 mg via INTRAVENOUS

## 2013-12-25 MED ORDER — LACTATED RINGERS IV SOLN
INTRAVENOUS | Status: DC
  Administered 2013-12-25: 09:00:00 via INTRAVENOUS
  Filled 2013-12-25: qty 1000

## 2013-12-25 MED ORDER — CEFAZOLIN SODIUM-DEXTROSE 2-3 GM-% IV SOLR
2.0000 g | INTRAVENOUS | Status: AC
  Administered 2013-12-25: 2 g via INTRAVENOUS
  Filled 2013-12-25: qty 50

## 2013-12-25 MED ORDER — FENTANYL CITRATE 0.05 MG/ML IJ SOLN
25.0000 ug | INTRAMUSCULAR | Status: DC | PRN
  Filled 2013-12-25: qty 1

## 2013-12-25 MED ORDER — ONDANSETRON HCL 4 MG/2ML IJ SOLN
INTRAMUSCULAR | Status: DC | PRN
  Administered 2013-12-25: 4 mg via INTRAVENOUS

## 2013-12-25 MED ORDER — IOHEXOL 350 MG/ML SOLN
INTRAVENOUS | Status: DC | PRN
  Administered 2013-12-25: 12 mL via URETHRAL

## 2013-12-25 MED ORDER — MEPERIDINE HCL 25 MG/ML IJ SOLN
6.2500 mg | INTRAMUSCULAR | Status: DC | PRN
  Filled 2013-12-25: qty 1

## 2013-12-25 MED ORDER — CEFAZOLIN SODIUM-DEXTROSE 2-3 GM-% IV SOLR
INTRAVENOUS | Status: AC
  Filled 2013-12-25: qty 50

## 2013-12-25 MED ORDER — CEFAZOLIN SODIUM 1-5 GM-% IV SOLN
1.0000 g | INTRAVENOUS | Status: DC
  Filled 2013-12-25: qty 50

## 2013-12-25 MED ORDER — FENTANYL CITRATE 0.05 MG/ML IJ SOLN
INTRAMUSCULAR | Status: AC
  Filled 2013-12-25: qty 2

## 2013-12-25 MED ORDER — PROMETHAZINE HCL 25 MG/ML IJ SOLN
6.2500 mg | INTRAMUSCULAR | Status: DC | PRN
  Filled 2013-12-25: qty 1

## 2013-12-25 MED ORDER — EPHEDRINE SULFATE 50 MG/ML IJ SOLN
INTRAMUSCULAR | Status: DC | PRN
  Administered 2013-12-25 (×2): 10 mg via INTRAVENOUS

## 2013-12-25 MED ORDER — DEXAMETHASONE SODIUM PHOSPHATE 10 MG/ML IJ SOLN
INTRAMUSCULAR | Status: DC | PRN
  Administered 2013-12-25: 10 mg via INTRAVENOUS

## 2013-12-25 MED ORDER — STERILE WATER FOR IRRIGATION IR SOLN
Status: DC | PRN
  Administered 2013-12-25: 3000 mL

## 2013-12-25 MED ORDER — PROPOFOL INFUSION 10 MG/ML OPTIME
INTRAVENOUS | Status: DC | PRN
  Administered 2013-12-25: 80 mL via INTRAVENOUS

## 2013-12-25 MED ORDER — LIDOCAINE HCL 2 % EX GEL
CUTANEOUS | Status: DC | PRN
  Administered 2013-12-25: 1 via URETHRAL

## 2013-12-25 MED ORDER — ACETAMINOPHEN 10 MG/ML IV SOLN
INTRAVENOUS | Status: DC | PRN
  Administered 2013-12-25: 1000 mg via INTRAVENOUS

## 2013-12-25 MED ORDER — FENTANYL CITRATE 0.05 MG/ML IJ SOLN
INTRAMUSCULAR | Status: DC | PRN
  Administered 2013-12-25: 50 ug via INTRAVENOUS
  Administered 2013-12-25 (×2): 25 ug via INTRAVENOUS

## 2013-12-25 MED ORDER — SODIUM CHLORIDE 0.9 % IR SOLN
Status: DC | PRN
  Administered 2013-12-25: 3000 mL via INTRAVESICAL

## 2013-12-25 MED ORDER — HYDROCODONE-ACETAMINOPHEN 5-325 MG PO TABS: 1.0000 | ORAL_TABLET | Freq: Four times a day (QID) | ORAL | Status: DC | PRN

## 2013-12-25 SURGICAL SUPPLY — 39 items
BAG DRAIN URO-CYSTO SKYTR STRL (DRAIN) ×2 IMPLANT
BAG DRN UROCATH (DRAIN) ×1
BAG URINE DRAINAGE (UROLOGICAL SUPPLIES) IMPLANT
BAG URINE LEG 500ML (DRAIN) ×1 IMPLANT
BALLN NEPHROSTOMY (BALLOONS) ×2
BALLOON NEPHROSTOMY (BALLOONS) ×1 IMPLANT
CANISTER SUCT LVC 12 LTR MEDI- (MISCELLANEOUS) ×1 IMPLANT
CATH FOLEY 2WAY SLVR  5CC 16FR (CATHETERS)
CATH FOLEY 2WAY SLVR  5CC 18FR (CATHETERS)
CATH FOLEY 2WAY SLVR  5CC 20FR (CATHETERS) ×1
CATH FOLEY 2WAY SLVR 5CC 16FR (CATHETERS) IMPLANT
CATH FOLEY 2WAY SLVR 5CC 18FR (CATHETERS) IMPLANT
CATH FOLEY 2WAY SLVR 5CC 20FR (CATHETERS) ×1 IMPLANT
CLOTH BEACON ORANGE TIMEOUT ST (SAFETY) ×2 IMPLANT
DRAPE CAMERA CLOSED 9X96 (DRAPES) ×2 IMPLANT
ELECT REM PT RETURN 9FT ADLT (ELECTROSURGICAL) ×2
ELECTRODE REM PT RTRN 9FT ADLT (ELECTROSURGICAL) ×1 IMPLANT
GLOVE BIO SURGEON STRL SZ7.5 (GLOVE) ×2 IMPLANT
GLOVE INDICATOR 7.5 STRL GRN (GLOVE) ×2 IMPLANT
GLOVE SURG SS PI 7.5 STRL IVOR (GLOVE) ×2 IMPLANT
GOWN STRL REIN XL XLG (GOWN DISPOSABLE) ×1 IMPLANT
GOWN STRL REUS W/ TWL XL LVL3 (GOWN DISPOSABLE) IMPLANT
GOWN STRL REUS W/TWL XL LVL3 (GOWN DISPOSABLE) ×4
GUIDEWIRE STR DUAL SENSOR (WIRE) ×1 IMPLANT
HOLDER FOLEY CATH W/STRAP (MISCELLANEOUS) ×1 IMPLANT
IV NS 500ML (IV SOLUTION) ×2
IV NS 500ML BAXH (IV SOLUTION) IMPLANT
IV NS IRRIG 3000ML ARTHROMATIC (IV SOLUTION) ×2 IMPLANT
LASER FIBER DISP (UROLOGICAL SUPPLIES) ×2 IMPLANT
LASER FIBER DISP 1000U (UROLOGICAL SUPPLIES) ×1 IMPLANT
NDL SAFETY ECLIPSE 18X1.5 (NEEDLE) IMPLANT
NEEDLE HYPO 18GX1.5 SHARP (NEEDLE)
NEEDLE HYPO 22GX1.5 SAFETY (NEEDLE) IMPLANT
NS IRRIG 500ML POUR BTL (IV SOLUTION) IMPLANT
PACK CYSTO (CUSTOM PROCEDURE TRAY) ×2 IMPLANT
SYR 20CC LL (SYRINGE) ×2 IMPLANT
SYR 30ML LL (SYRINGE) IMPLANT
SYRINGE IRR TOOMEY STRL 70CC (SYRINGE) ×2 IMPLANT
WATER STERILE IRR 3000ML UROMA (IV SOLUTION) ×2 IMPLANT

## 2013-12-25 NOTE — H&P (Signed)
Reason For Visit   Mr Gialanella presents today of a more acute followup secondary to several days of gross, painless hematuria. The paragraph below will summarize his history. I saw him approximately 2 months ago. He did develop several days of gross hematuria, which subsequently resolved. He was empirically put on antibiotics, but his urine culture was actually negative. He has continued to have severe voiding symptoms both obstructive and irritative in nature. Renal function was normal. Because of his hematuria, he was seen by Diane who did obtain a hematuria protocol CT. This showed a nonobstructing left renal stone that was not causing any problems. There was a low-density lesion within the pancreas. It turns out he has had this lesion for some time and underwent an MRI approximately a year ago, which showed no significant change. Based on the size measurement, it does not appear that this pancreatic abnormality has changed. He needs to have cystoscopy today to further assess the gross hematuria. Of note, postvoid residual recently was negligible.      History of Present Illness Mr Engelson presents today for a followup visit. I saw him for the first time approximately 18 months when he reestablished with [facility]. Prior to that he had been under the care of Dr Lawerance Bach and had been diagnosed with adenocarcinoma of the prostate in the 1990's. He is status post seed implantation in 1998. More recently, his biggest problems had been ongoing significant bladder overactivity. He has had primarily irritative voiding symptoms but some obstructive symptoms. When I saw him, there was no obvious concern of prostate cancer. He did have significant pyuria and bacteruria. He has had several positive urine cultures over the last couple of years, but also many cultures which have been negative. He has had both E coli as well as Enterococcus documented. When I saw him, his PSA was essentially undetectable and we  felt that given his age of 83 and remote history of prostate cancer, that ongoing PSA testing was really not necessary.  Because of significant ongoing voiding symptoms, the patient did undergo urodynamics. That showed considerable bladder hypersensitivity with unstable bladder contractions. He also had evidence potentially of obstruction with high detrusor bladder pressures and weak flow rates. Cystoscopically he had a tight bladder neck but no evidence of obvious scarring or true bladder neck contracture. We felt he was a good candidate for alpha-blocker therapy. The patient did not respond as well to alpha-blockers as we had hoped. He has also been tried on a number of medications for OAB, which have not helped tremendously. The patient was able to subsequently void after developing urinary retention after his urodynamics. More recent postvoid residuals have been negligible. Urinalysis today does show a small degree of microhematuria and pyuria, which has been a fairly chronic finding. He remains on Rapaflo. At his last visit here, it was suggested that he try some Trospium once again, but elected not to use that medication since he had tried it in the past without obvious improvement.     Past Medical History Problems  1. 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 Aspirin 81 MG Oral Tablet;  Therapy: (Recorded:02Sep2015) to Recorded 2. Atorvastatin Calcium 40 MG Oral Tablet;  Therapy: (Recorded:28Jul2015) to Recorded 3. Calcium + D TABS;  Therapy: (Recorded:20Feb2014) to Recorded 4. Cephalexin 500 MG Oral Capsule; TAKE 1 CAPSULE BID;  Therapy: 02Sep2015 to (Last Rx:02Sep2015)  Requested for: 02Sep2015 Ordered 5. Galantamine Hydrobromide 8 MG Oral Tablet;  Therapy: (Recorded:20Feb2014) to Recorded 6.  Hydroxypropyl Methylcellulose 2.5 % SOLN;  Therapy: (Recorded:28Jul2015) to Recorded 7. Isosorbide Mononitrate ER 30 MG Oral Tablet Extended Release 24 Hour;  Therapy:  (Recorded:20Feb2014) to Recorded 8. Multi-Day TABS;  Therapy: (Recorded:20Feb2014) to Recorded 9. Nitrostat SUBL;  Therapy: (Recorded:20Feb2014) to Recorded 10. Rapaflo 8 MG Oral Capsule; TAKE 1 CAPSULE Daily;   Therapy: [id number] to (Evaluate:15Jan2016)  Requested for: 20Jan2015; Last   Rx:20Jan2015 Ordered 11. Vitamin D3 CAPS; 2000 iu;   Therapy: (Recorded:20Jan2015) to Recorded 12. Zantac CAPS (Ranitidine HCl);   Therapy: (Recorded:02Sep2015) to Recorded  Allergies Medication  1. Cipro TABS 2. Nitrofurantoin Monohyd Macro CAPS 3. Oxybutynin Chloride TABS 4. Simvastatin TABS 5. Sulfa Drugs  Family History Problems  1. Family history of tuberculosis (V18.8) : Father  Social History Problems  1. Former smoker [place])  Review of Systems Genitourinary, constitutional, skin, eye, otolaryngeal, hematologic/lymphatic, cardiovascular, pulmonary, endocrine, musculoskeletal, gastrointestinal, neurological and psychiatric system(s) were reviewed and pertinent findings if present are noted.  Genitourinary: urinary frequency, feelings of urinary urgency, dysuria, incontinence, difficulty starting the urinary stream, weak urinary stream, urinary stream starts and stops, incomplete emptying of bladder, hematuria, erectile dysfunction and penile pain.  Gastrointestinal: heartburn.  Constitutional: night sweats and feeling tired (fatigue).  Hematologic/Lymphatic: a tendency to easily bruise.  Respiratory: shortness of breath and shortness of breath during exertion.  Musculoskeletal: joint pain.    Vitals Vital Signs [Data Includes: Last 1 Day]  Recorded: 24Sep2015 12:46PM  Blood Pressure: 113 / 57 Temperature: 97 F Heart Rate: 35  Well-developed well-nourished male in no acute distress Respiratory: Normal effort Cardiac: Regular rate and rhythm Abdomen: Soft nontender no obvious masses Extremities: No asymmetric edema/tenderness  Results/Data Urine [Data Includes: Last 1 Day]    24Sep2015 COLOR YELLOW  APPEARANCE CLEAR  SPECIFIC GRAVITY 1.015  pH 6.0  GLUCOSE NEG mg/dL BILIRUBIN NEG  KETONE NEG mg/dL BLOOD NEG  PROTEIN NEG mg/dL UROBILINOGEN 0.2 mg/dL NITRITE NEG  LEUKOCYTE ESTERASE SMALL  SQUAMOUS EPITHELIAL/HPF RARE  WBC 3-6 WBC/hpf RBC 0-2 RBC/hpf BACTERIA NONE SEEN  CRYSTALS NONE SEEN  CASTS NONE SEEN   Procedure  Procedure: Cystoscopy  Chaperone Present: LAURA.  Indication: Hematuria. Lower Urinary Tract Symptoms.  Informed Consent: Risks, benefits, and potential adverse events were discussed and informed consent was obtained from the patient . Specific risks including, but not limited to bleeding, infection, pain, allergic reaction etc. were explained.  Prep: The patient was prepped with betadine.  Anesthesia:. Local anesthesia was administered intraurethrally with 2% lidocaine jelly.  Procedure Note:  Urethral meatus:. No abnormalities.  Anterior urethra:. A moderate stricture was present in the bulbar urethra but was not manipulated.  Bladder:    Assessment Assessed  1. Chronic cystitis (595.2) 2. Prostate cancer (185) 3. Urethral stricture (598.9) 4. Gross hematuria (599.71)  Plan Gross hematuria  1. Cysto; Status:Hold For - Appointment,Date of Service; Requested [id number];  2. Follow-up Schedule Surgery Office  Follow-up  Status: Hold For - Appointment   Requested for: 24Sep2015 Health Maintenance  3. UA With REFLEX; [Do Not Release]; Status:Complete;   Done: [id number] 12:10PM  Discussion/Summary   Mr Depa recently underwent CT imaging for several days of gross hematuria. The patient had a nonobstructive renal stone, but a definitive etiology for his hematuria has not been established. We were hoping to perform cystoscopy today, but secondary to bulbar urethral stricture disease, I am unable to assess his bladder. I do think it is important that we take him to outpatient surgery and perform a balloon dilation. This will  allow  access to the bladder where I can rule out more significant pathology. This may also help his overall voiding, but it is difficult to know and certainly strictures can recur. With regard to the small pancreatic abnormality, there does not appear to have been much change over a number of years and I do not think that this will probably need any additional assessment. We will try to get him on the surgical schedule, non-urgently, sometime in the next few weeks.

## 2013-12-25 NOTE — Transfer of Care (Signed)
Immediate Anesthesia Transfer of Care Note  Patient: Ronald [name]  Procedure(s) Performed: Procedure(s): CYSTOSCOPY WITH URETHRAL DILATATION, WITH BIOPSY (N/A)  Patient Location: PACU  Anesthesia Type:General  Level of Consciousness: awake, alert , oriented and patient cooperative  Airway & Oxygen Therapy: Patient Spontanous Breathing and Patient connected to nasal cannula oxygen  Post-op Assessment: Report given to PACU RN and Post -op Vital signs reviewed and stable  Post vital signs: Reviewed and stable  Complications: No apparent anesthesia complications

## 2013-12-25 NOTE — Anesthesia Preprocedure Evaluation (Addendum)
Anesthesia Evaluation  Patient identified by MRN, date of birth, ID band Patient awake    Reviewed: Allergy & Precautions, H&P , NPO status , Patient's Chart, lab work & pertinent test results  Airway Mallampati: II TM Distance: >3 FB Neck ROM: Full    Dental no notable dental hx. (+) Missing, Dental Advisory Given, Chipped, Partial Upper   Pulmonary neg pulmonary ROS, former smoker,  breath sounds clear to auscultation  Pulmonary exam normal       Cardiovascular hypertension, Pt. on medications + CAD, + Past MI and + CABG Cardiac stents: 1989, 1997. Rhythm:Regular Rate:Normal  Ischemic cardiomyopathy ef 35-40% per cath 08-28-2013 Cardiac cath 08/28/13 with 95% left main stenosis, occluded LAD, occluded Circumflex, occluded RCA. Patent LIMA to LAD, patent SVG to OM supplying collaterals to RCA. Occluded SVG to RCA and SVG to intermediate branch. Medical therapy recommended   Neuro/Psych negative neurological ROS  negative psych ROS   GI/Hepatic Neg liver ROS, GERD-  Medicated and Controlled,  Endo/Other  negative endocrine ROS  Renal/GU negative Renal ROS  negative genitourinary   Musculoskeletal negative musculoskeletal ROS (+)   Abdominal   Peds negative pediatric ROS (+)  Hematology negative hematology ROS (+)   Anesthesia Other Findings   Reproductive/Obstetrics negative OB ROS                         Anesthesia Physical Anesthesia Plan  ASA: III  Anesthesia Plan: General   Post-op Pain Management:    Induction: Intravenous  Airway Management Planned: LMA  Additional Equipment:   Intra-op Plan:   Post-operative Plan: Extubation in OR  Informed Consent: I have reviewed the patients History and Physical, chart, labs and discussed the procedure including the risks, benefits and alternatives for the proposed anesthesia with the patient or authorized representative who has indicated  his/her understanding and acceptance.   Dental advisory given  Plan Discussed with: CRNA  Anesthesia Plan Comments: (At moderate risk for cardiac complications. Avoid hypoxia and hypotension and tachycardia.)        Anesthesia Quick Evaluation

## 2013-12-25 NOTE — Anesthesia Procedure Notes (Addendum)
Performed by: Wanita Chamberlain   Procedure Name: LMA Insertion Date/Time: 12/25/2013 10:02 AM Performed by: Wanita Chamberlain Pre-anesthesia Checklist: Patient identified, Timeout performed, Emergency Drugs available, Suction available and Patient being monitored Patient Re-evaluated:Patient Re-evaluated prior to inductionOxygen Delivery Method: Circle system utilized Preoxygenation: Pre-oxygenation with 100% oxygen Intubation Type: IV induction Ventilation: Mask ventilation without difficulty LMA: LMA inserted LMA Size: 5.0 Number of attempts: 1 Placement Confirmation: breath sounds checked- equal and bilateral and positive ETCO2 Tube secured with: Tape Dental Injury: Teeth and Oropharynx as per pre-operative assessment

## 2013-12-25 NOTE — Op Note (Signed)
Preoperative diagnosis: Urethral stricture, gross hematuria Postoperative diagnosis: Same,  large stone embedded and prostatic urethra/bladder neck Procedure: Cystoscopy, balloon dilation of urethral stricture, holmium laser lithotripsy of large bladder calculus  Surgeon: Bernestine Amass M.D.  Anesthesia: Gen.  Indications: Patient is 80 years of age. He has had very long-standing voiding symptoms. He has a remote history of adenocarcinoma prostate status post seed implantation in 1998. He has had ongoing and progressive voiding symptoms with increased bladder overactivity and weakening of his urinary stream. The patient recently developed several days of total gross painless hematuria. Cystoscopy was attempted in our office which was unsuccessful due to urethral stricture disease. CT imaging revealed a nonobstructing left renal stone. No other significant abnormalities were appreciated. He presents now for dilation of the stricture to allow for access to the bladder for diagnostic purposes.     Technique and findings: Patient was brought the operating room where he had successful induction general anesthesia. He was placed in lithotomy position prepped and draped in usual manner. Appropriate surgical timeout was performed. Cystoscopy revealed unremarkable anterior urethra to the proximal bulbar urethra. There there appeared to be significant narrowing that was unable to advance the cystoscope. Guidewire was placed in the area into the bladder with fluoroscopic guidance. Initial attempts to place a dilating balloon were unsuccessful and therefore the patient was dilated from 14-18 [place] with [facility] dilators. A balloon was then passed over the guidewire. We used a 24 French fascial dilating balloon to 15 atmospheres of pressure for approximately 3 minutes. On repeat cystoscopy the patient appeared to have a large nodular mass within his prostatic urethra. It appeared to be covered with mucosal tissue. On  further evaluation this appeared to be embedded very large calculus. Holmium laser lithotripter fiber was used at 0.8 J x8 Hz to fracture the stone and 2 on embedded. We were able to eventually get this large mass of stone material into the bladder where additional holmium laser lithotripsy was utilized until it was fractured into small enough pieces that they could be irrigated. At the completion of the procedure we placed a 20 French Foley catheter which drained light pink urine. The bladder stone material along with what appeared to be some necrotic tissue was removed. The patient was brought to recovery room in stable condition. Estimated size the stone was approximately 4 cm.

## 2013-12-25 NOTE — Discharge Instructions (Addendum)
Cystoscopy patient instructions  Following a cystoscopy, a catheter (a flexible rubber tube) is sometimes left in place to empty the bladder. This may cause some discomfort or a feeling that you need to urinate. Your doctor determines the period of time that the catheter will be left in place. You may have bloody urine for two to three days (Call your doctor if the amount of bleeding increases or does not subside).  You may pass blood clots in your urine, especially if you had a biopsy. It is not unusual to pass small blood clots and have some bloody urine a couple of weeks after your cystoscopy. Again, call your doctor if the bleeding does not subside. You may have: Dysuria (painful urination) Frequency (urinating often) Urgency (strong desire to urinate)  These symptoms are common especially if medicine is instilled into the bladder or a ureteral stent is placed. Avoiding alcohol and caffeine, such as coffee, tea, and chocolate, may help relieve these symptoms. Drink plenty of water, unless otherwise instructed. Your doctor may also prescribe an antibiotic or other medicine to reduce these symptoms.  Cystoscopy results are available soon after the procedure; biopsy results usually take two to four days. Your doctor will discuss the results of your exam with you. Before you go home, you will be given specific instructions for follow-up care. Special Instructions:  1 If you are going home with a catheter in place do not take a tub bath until removed by your doctor.  2 You may resume your normal activities.  3 Do not drive or operate machinery if you are taking narcotic pain medicine.  4 Be sure to keep all follow-up appointments with your doctor.   5 Call Your Doctor If: The catheter is not draining  You have severe pain  You are unable to urinate  You have a fever over 101  You have severe bleeding          Post Anesthesia Home Care Instructions  Activity: Get plenty of rest for  the remainder of the day. A responsible adult should stay with you for 24 hours following the procedure.  For the next 24 hours, DO NOT: -Drive a car -Paediatric nurse -Drink alcoholic beverages -Take any medication unless instructed by your physician -Make any legal decisions or sign important papers.  Meals: Start with liquid foods such as gelatin or soup. Progress to regular foods as tolerated. Avoid greasy, spicy, heavy foods. If nausea and/or vomiting occur, drink only clear liquids until the nausea and/or vomiting subsides. Call your physician if vomiting continues.  Special Instructions/Symptoms: Your throat may feel dry or sore from the anesthesia or the breathing tube placed in your throat during surgery. If this causes discomfort, gargle with warm salt water. The discomfort should disappear within 24 hours. Foley Catheter Care A Foley catheter is a soft, flexible tube that is placed into the bladder to drain urine. A Foley catheter may be inserted if:  You leak urine or are not able to control when you urinate (urinary incontinence).  You are not able to urinate when you need to (urinary retention).  You had prostate surgery or surgery on the genitals.  You have certain medical conditions, such as multiple sclerosis, dementia, or a spinal cord injury. If you are going home with a Foley catheter in place, follow the instructions below. TAKING CARE OF THE CATHETER 1. Wash your hands with soap and water. 2. Using mild soap and warm water on a clean washcloth:  Clean  the area on your body closest to the catheter insertion site using a circular motion, moving away from the catheter. Never wipe toward the catheter because this could sweep bacteria up into the urethra and cause infection.  Remove all traces of soap. Pat the area dry with a clean towel. For males, reposition the foreskin. 3. Attach the catheter to your leg so there is no tension on the catheter. Use adhesive tape or  a leg strap. If you are using adhesive tape, remove any sticky residue left behind by the previous tape you used. 4. Keep the drainage bag below the level of the bladder, but keep it off the floor. 5. Check throughout the day to be sure the catheter is working and urine is draining freely. Make sure the tubing does not become kinked. 6. Do not pull on the catheter or try to remove it. Pulling could damage internal tissues. TAKING CARE OF THE DRAINAGE BAGS You will be given two drainage bags to take home. One is a large overnight drainage bag, and the other is a smaller leg bag that fits underneath clothing. You may wear the overnight bag at any time, but you should never wear the smaller leg bag at night. Follow the instructions below for how to empty, change, and clean your drainage bags. Emptying the Drainage Bag You must empty your drainage bag when it is  - full or at least 2-3 times a day. 1. Wash your hands with soap and water. 2. Keep the drainage bag below your hips, below the level of your bladder. This stops urine from going back into the tubing and into your bladder. 3. Hold the dirty bag over the toilet or a clean container. 4. Open the pour spout at the bottom of the bag and empty the urine into the toilet or container. Do not let the pour spout touch the toilet, container, or any other surface. Doing so can place bacteria on the bag, which can cause an infection. 5. Clean the pour spout with a gauze pad or cotton ball that has rubbing alcohol on it. 6. Close the pour spout. 7. Attach the bag to your leg with adhesive tape or a leg strap. 8. Wash your hands well. Changing the Drainage Bag Change your drainage bag once a month or sooner if it starts to smell bad or look dirty. Below are steps to follow when changing the drainage bag. 1. Wash your hands with soap and water. 2. Pinch off the rubber catheter so that urine does not spill out. 3. Disconnect the catheter tube from the  drainage tube at the connection valve. Do not let the tubes touch any surface. 4. Clean the end of the catheter tube with an alcohol wipe. Use a different alcohol wipe to clean the end of the drainage tube. 5. Connect the catheter tube to the drainage tube of the clean drainage bag. 6. Attach the new bag to the leg with adhesive tape or a leg strap. Avoid attaching the new bag too tightly. 7. Wash your hands well. Cleaning the Drainage Bag 1. Wash your hands with soap and water. 2. Wash the bag in warm, soapy water. 3. Rinse the bag thoroughly with warm water. 4. Fill the bag with a solution of white vinegar and water (1 cup vinegar to 1 qt warm water [.2 L vinegar to 1 L warm water]). Close the bag and soak it for 30 minutes in the solution. 5. Rinse the bag with warm water.  6. Hang the bag to dry with the pour spout open and hanging downward. 7. Store the clean bag (once it is dry) in a clean plastic bag. 8. Wash your hands well. PREVENTING INFECTION  Wash your hands before and after handling your catheter.  Take showers daily and wash the area where the catheter enters your body. Do not take baths. Replace wet leg straps with dry ones, if this applies.  Do not use powders, sprays, or lotions on the genital area. Only use creams, lotions, or ointments as directed by your caregiver.  For females, wipe from front to back after each bowel movement.  Drink enough fluids to keep your urine clear or pale yellow unless you have a fluid restriction.  Do not let the drainage bag or tubing touch or lie on the floor.  Wear cotton underwear to absorb moisture and to keep your skin drier. SEEK MEDICAL CARE IF:   Your urine is cloudy or smells unusually bad.  Your catheter becomes clogged.  You are not draining urine into the bag or your bladder feels full.  Your catheter starts to leak. SEEK IMMEDIATE MEDICAL CARE IF:   You have pain, swelling, redness, or pus where the catheter enters  the body.  You have pain in the abdomen, legs, lower back, or bladder.  You have a fever.  You see blood fill the catheter, or your urine is pink or red.  You have nausea, vomiting, or chills.  Your catheter gets pulled out. MAKE SURE YOU:   Understand these instructions.  Will watch your condition.  Will get help right away if you are not doing well or get worse. Document Released: 03/14/2005 Document Revised: 07/29/2013 Document Reviewed: 03/05/2012 [facility] Patient Information 2015 [place], [place]. This information is not intended to replace advice given to you by your health care provider. Make sure you discuss any questions you have with your health care provider.

## 2013-12-25 NOTE — Interval H&P Note (Signed)
History and Physical Interval Note:  12/25/2013 9:50 AM  Ronald [name]  has presented today for surgery, with the diagnosis of Urethral Stricture, Gross Hematuria  The various methods of treatment have been discussed with the patient and family. After consideration of risks, benefits and other options for treatment, the patient has consented to  Procedure(s): CYSTOSCOPY WITH URETHRAL DILATATION, WITH BIOPSY (N/A) as a surgical intervention .  The patient's history has been reviewed, patient examined, no change in status, stable for surgery.  I have reviewed the patient's chart and labs.  Questions were answered to the patient's satisfaction.     Kena Limon S

## 2013-12-26 ENCOUNTER — Encounter (HOSPITAL_BASED_OUTPATIENT_CLINIC_OR_DEPARTMENT_OTHER): Payer: Self-pay | Admitting: Urology

## 2013-12-26 NOTE — Anesthesia Postprocedure Evaluation (Signed)
  Anesthesia Post-op Note  Patient: Ronald [name]  Procedure(s) Performed: Procedure(s) (LRB): CYSTOSCOPY WITH URETHRAL DILATATION, WITH BIOPSY (N/A)  Patient Location: PACU  Anesthesia Type: General  Level of Consciousness: awake and alert   Airway and Oxygen Therapy: Patient Spontanous Breathing  Post-op Pain: mild  Post-op Assessment: Post-op Vital signs reviewed, Patient's Cardiovascular Status Stable, Respiratory Function Stable, Patent Airway and No signs of Nausea or vomiting  Last Vitals:  Filed Vitals:   12/25/13 1245  BP: 124/55  Pulse: 54  Temp: 36.4 C  Resp: 16    Post-op Vital Signs: stable   Complications: No apparent anesthesia complications

## 2013-12-30 DIAGNOSIS — N359 Urethral stricture, unspecified: Secondary | ICD-10-CM | POA: Diagnosis not present

## 2014-01-01 ENCOUNTER — Encounter (HOSPITAL_COMMUNITY): Payer: Self-pay | Admitting: Emergency Medicine

## 2014-01-01 DIAGNOSIS — Z85820 Personal history of malignant melanoma of skin: Secondary | ICD-10-CM

## 2014-01-01 DIAGNOSIS — Z7982 Long term (current) use of aspirin: Secondary | ICD-10-CM | POA: Diagnosis not present

## 2014-01-01 DIAGNOSIS — I5022 Chronic systolic (congestive) heart failure: Secondary | ICD-10-CM | POA: Diagnosis present

## 2014-01-01 DIAGNOSIS — E780 Pure hypercholesterolemia: Secondary | ICD-10-CM | POA: Diagnosis present

## 2014-01-01 DIAGNOSIS — I252 Old myocardial infarction: Secondary | ICD-10-CM

## 2014-01-01 DIAGNOSIS — N4889 Other specified disorders of penis: Secondary | ICD-10-CM | POA: Diagnosis not present

## 2014-01-01 DIAGNOSIS — Z8546 Personal history of malignant neoplasm of prostate: Secondary | ICD-10-CM

## 2014-01-01 DIAGNOSIS — I255 Ischemic cardiomyopathy: Secondary | ICD-10-CM | POA: Diagnosis present

## 2014-01-01 DIAGNOSIS — N359 Urethral stricture, unspecified: Secondary | ICD-10-CM | POA: Diagnosis not present

## 2014-01-01 DIAGNOSIS — B952 Enterococcus as the cause of diseases classified elsewhere: Secondary | ICD-10-CM | POA: Diagnosis not present

## 2014-01-01 DIAGNOSIS — Z8249 Family history of ischemic heart disease and other diseases of the circulatory system: Secondary | ICD-10-CM | POA: Diagnosis not present

## 2014-01-01 DIAGNOSIS — N3281 Overactive bladder: Secondary | ICD-10-CM | POA: Diagnosis present

## 2014-01-01 DIAGNOSIS — Z889 Allergy status to unspecified drugs, medicaments and biological substances status: Secondary | ICD-10-CM | POA: Diagnosis not present

## 2014-01-01 DIAGNOSIS — Z881 Allergy status to other antibiotic agents status: Secondary | ICD-10-CM

## 2014-01-01 DIAGNOSIS — E785 Hyperlipidemia, unspecified: Secondary | ICD-10-CM | POA: Diagnosis present

## 2014-01-01 DIAGNOSIS — Z452 Encounter for adjustment and management of vascular access device: Secondary | ICD-10-CM | POA: Diagnosis not present

## 2014-01-01 DIAGNOSIS — R509 Fever, unspecified: Secondary | ICD-10-CM | POA: Diagnosis not present

## 2014-01-01 DIAGNOSIS — Z951 Presence of aortocoronary bypass graft: Secondary | ICD-10-CM

## 2014-01-01 DIAGNOSIS — K219 Gastro-esophageal reflux disease without esophagitis: Secondary | ICD-10-CM | POA: Diagnosis present

## 2014-01-01 DIAGNOSIS — I2510 Atherosclerotic heart disease of native coronary artery without angina pectoris: Secondary | ICD-10-CM | POA: Diagnosis present

## 2014-01-01 DIAGNOSIS — N401 Enlarged prostate with lower urinary tract symptoms: Secondary | ICD-10-CM | POA: Diagnosis present

## 2014-01-01 DIAGNOSIS — Z720 Tobacco use: Secondary | ICD-10-CM | POA: Diagnosis not present

## 2014-01-01 DIAGNOSIS — Z833 Family history of diabetes mellitus: Secondary | ICD-10-CM

## 2014-01-01 DIAGNOSIS — N210 Calculus in bladder: Secondary | ICD-10-CM | POA: Diagnosis not present

## 2014-01-01 DIAGNOSIS — N390 Urinary tract infection, site not specified: Secondary | ICD-10-CM | POA: Diagnosis not present

## 2014-01-01 DIAGNOSIS — A4181 Sepsis due to Enterococcus: Principal | ICD-10-CM | POA: Diagnosis present

## 2014-01-01 DIAGNOSIS — D696 Thrombocytopenia, unspecified: Secondary | ICD-10-CM | POA: Diagnosis present

## 2014-01-01 DIAGNOSIS — F0390 Unspecified dementia without behavioral disturbance: Secondary | ICD-10-CM | POA: Diagnosis present

## 2014-01-01 DIAGNOSIS — Z882 Allergy status to sulfonamides status: Secondary | ICD-10-CM

## 2014-01-01 DIAGNOSIS — T8351XA Infection and inflammatory reaction due to indwelling urinary catheter, initial encounter: Secondary | ICD-10-CM | POA: Diagnosis not present

## 2014-01-01 DIAGNOSIS — I517 Cardiomegaly: Secondary | ICD-10-CM | POA: Diagnosis not present

## 2014-01-01 DIAGNOSIS — R7881 Bacteremia: Secondary | ICD-10-CM | POA: Diagnosis not present

## 2014-01-01 DIAGNOSIS — E878 Other disorders of electrolyte and fluid balance, not elsewhere classified: Secondary | ICD-10-CM | POA: Diagnosis not present

## 2014-01-01 DIAGNOSIS — I10 Essential (primary) hypertension: Secondary | ICD-10-CM | POA: Diagnosis present

## 2014-01-01 DIAGNOSIS — A419 Sepsis, unspecified organism: Secondary | ICD-10-CM | POA: Diagnosis not present

## 2014-01-01 DIAGNOSIS — R6510 Systemic inflammatory response syndrome (SIRS) of non-infectious origin without acute organ dysfunction: Secondary | ICD-10-CM | POA: Diagnosis not present

## 2014-01-01 NOTE — ED Notes (Signed)
The pt has had weakness since Monday.  He had a foley cath plkaced Wednesday one week ago.  His foley cath was removed this past Monday.  Since then he has had chills and a low grade temp

## 2014-01-02 ENCOUNTER — Encounter (HOSPITAL_COMMUNITY): Payer: Self-pay | Admitting: General Practice

## 2014-01-02 ENCOUNTER — Inpatient Hospital Stay (HOSPITAL_COMMUNITY)
Admission: EM | Admit: 2014-01-02 | Discharge: 2014-01-07 | DRG: 872 | Disposition: A | Discharge status: Home / Self Care | Payer: Medicare Other | Attending: Internal Medicine | Admitting: Internal Medicine

## 2014-01-02 DIAGNOSIS — L03119 Cellulitis of unspecified part of limb: Secondary | ICD-10-CM

## 2014-01-02 DIAGNOSIS — N200 Calculus of kidney: Secondary | ICD-10-CM

## 2014-01-02 DIAGNOSIS — I200 Unstable angina: Secondary | ICD-10-CM

## 2014-01-02 DIAGNOSIS — B9620 Unspecified Escherichia coli [E. coli] as the cause of diseases classified elsewhere: Secondary | ICD-10-CM

## 2014-01-02 DIAGNOSIS — R001 Bradycardia, unspecified: Secondary | ICD-10-CM

## 2014-01-02 DIAGNOSIS — A419 Sepsis, unspecified organism: Secondary | ICD-10-CM

## 2014-01-02 DIAGNOSIS — R6510 Systemic inflammatory response syndrome (SIRS) of non-infectious origin without acute organ dysfunction: Secondary | ICD-10-CM

## 2014-01-02 DIAGNOSIS — Z87442 Personal history of urinary calculi: Secondary | ICD-10-CM

## 2014-01-02 DIAGNOSIS — T8351XA Infection and inflammatory reaction due to indwelling urinary catheter, initial encounter: Secondary | ICD-10-CM

## 2014-01-02 DIAGNOSIS — I10 Essential (primary) hypertension: Secondary | ICD-10-CM | POA: Diagnosis not present

## 2014-01-02 DIAGNOSIS — R6883 Chills (without fever): Secondary | ICD-10-CM

## 2014-01-02 DIAGNOSIS — I5022 Chronic systolic (congestive) heart failure: Secondary | ICD-10-CM

## 2014-01-02 DIAGNOSIS — T83511A Infection and inflammatory reaction due to indwelling urethral catheter, initial encounter: Secondary | ICD-10-CM

## 2014-01-02 DIAGNOSIS — R071 Chest pain on breathing: Secondary | ICD-10-CM

## 2014-01-02 DIAGNOSIS — N390 Urinary tract infection, site not specified: Secondary | ICD-10-CM

## 2014-01-02 DIAGNOSIS — Z113 Encounter for screening for infections with a predominantly sexual mode of transmission: Secondary | ICD-10-CM

## 2014-01-02 DIAGNOSIS — I519 Heart disease, unspecified: Secondary | ICD-10-CM

## 2014-01-02 DIAGNOSIS — I2510 Atherosclerotic heart disease of native coronary artery without angina pectoris: Secondary | ICD-10-CM | POA: Diagnosis present

## 2014-01-02 DIAGNOSIS — R7881 Bacteremia: Secondary | ICD-10-CM

## 2014-01-02 DIAGNOSIS — N210 Calculus in bladder: Secondary | ICD-10-CM

## 2014-01-02 DIAGNOSIS — L02619 Cutaneous abscess of unspecified foot: Secondary | ICD-10-CM

## 2014-01-02 DIAGNOSIS — E7800 Pure hypercholesterolemia, unspecified: Secondary | ICD-10-CM | POA: Diagnosis present

## 2014-01-02 DIAGNOSIS — Z8546 Personal history of malignant neoplasm of prostate: Secondary | ICD-10-CM

## 2014-01-02 DIAGNOSIS — G510 Bell's palsy: Secondary | ICD-10-CM

## 2014-01-02 DIAGNOSIS — B952 Enterococcus as the cause of diseases classified elsewhere: Secondary | ICD-10-CM

## 2014-01-02 HISTORY — DX: Malignant neoplasm of prostate: C61

## 2014-01-02 HISTORY — DX: Malignant melanoma of unspecified ear and external auricular canal: C43.20

## 2014-01-02 HISTORY — DX: Calculus of kidney: N20.0

## 2014-01-02 LAB — CBC WITH DIFFERENTIAL/PLATELET
Basophils Absolute: 0 10*3/uL (ref 0.0–0.1)
Basophils Relative: 0 % (ref 0–1)
Eosinophils Absolute: 0 10*3/uL (ref 0.0–0.7)
Eosinophils Relative: 0 % (ref 0–5)
HCT: 41.4 % (ref 39.0–52.0)
Hemoglobin: 13.9 g/dL (ref 13.0–17.0)
Lymphocytes Relative: 2 % — ABNORMAL LOW (ref 12–46)
Lymphs Abs: 0.2 10*3/uL — ABNORMAL LOW (ref 0.7–4.0)
MCH: 29.9 pg (ref 26.0–34.0)
MCHC: 33.6 g/dL (ref 30.0–36.0)
MCV: 89 fL (ref 78.0–100.0)
Monocytes Absolute: 1.4 10*3/uL — ABNORMAL HIGH (ref 0.1–1.0)
Monocytes Relative: 13 % — ABNORMAL HIGH (ref 3–12)
Neutro Abs: 9.2 10*3/uL — ABNORMAL HIGH (ref 1.7–7.7)
Neutrophils Relative %: 85 % — ABNORMAL HIGH (ref 43–77)
Platelets: 151 10*3/uL (ref 150–400)
RBC: 4.65 MIL/uL (ref 4.22–5.81)
RDW: 13 % (ref 11.5–15.5)
WBC: 10.8 10*3/uL — ABNORMAL HIGH (ref 4.0–10.5)

## 2014-01-02 LAB — URINALYSIS, ROUTINE W REFLEX MICROSCOPIC
Bilirubin Urine: NEGATIVE
Glucose, UA: NEGATIVE mg/dL
Ketones, ur: NEGATIVE mg/dL
Nitrite: NEGATIVE
Protein, ur: NEGATIVE mg/dL
Specific Gravity, Urine: 1.021 (ref 1.005–1.030)
Urobilinogen, UA: 0.2 mg/dL (ref 0.0–1.0)
pH: 6 (ref 5.0–8.0)

## 2014-01-02 LAB — COMPREHENSIVE METABOLIC PANEL
ALT: 24 U/L (ref 0–53)
AST: 34 U/L (ref 0–37)
Albumin: 3.3 g/dL — ABNORMAL LOW (ref 3.5–5.2)
Alkaline Phosphatase: 82 U/L (ref 39–117)
Anion gap: 16 — ABNORMAL HIGH (ref 5–15)
BUN: 22 mg/dL (ref 6–23)
CO2: 19 mEq/L (ref 19–32)
Calcium: 8.8 mg/dL (ref 8.4–10.5)
Chloride: 100 mEq/L (ref 96–112)
Creatinine, Ser: 0.95 mg/dL (ref 0.50–1.35)
GFR calc Af Amer: 86 mL/min — ABNORMAL LOW (ref >90)
GFR calc non Af Amer: 74 mL/min — ABNORMAL LOW (ref >90)
Glucose, Bld: 131 mg/dL — ABNORMAL HIGH (ref 70–99)
Potassium: 5 mEq/L (ref 3.7–5.3)
Sodium: 135 mEq/L — ABNORMAL LOW (ref 137–147)
Total Bilirubin: 0.8 mg/dL (ref 0.3–1.2)
Total Protein: 7 g/dL (ref 6.0–8.3)

## 2014-01-02 LAB — URINE MICROSCOPIC-ADD ON

## 2014-01-02 MED ORDER — POLYVINYL ALCOHOL 1.4 % OP SOLN
1.0000 [drp] | Freq: Two times a day (BID) | OPHTHALMIC | Status: DC
  Administered 2014-01-02 – 2014-01-07 (×10): 1 [drp] via OPHTHALMIC
  Filled 2014-01-02 (×2): qty 15

## 2014-01-02 MED ORDER — DEXTROSE 5 % IV SOLN
1.0000 g | Freq: Once | INTRAVENOUS | Status: AC
Start: 2014-01-02 — End: 2014-01-02
  Administered 2014-01-02: 1 g via INTRAVENOUS
  Filled 2014-01-02: qty 10

## 2014-01-02 MED ORDER — ACETAMINOPHEN 325 MG PO TABS
650.0000 mg | ORAL_TABLET | Freq: Four times a day (QID) | ORAL | Status: DC | PRN
  Administered 2014-01-05 – 2014-01-07 (×3): 650 mg via ORAL
  Filled 2014-01-02 (×3): qty 2

## 2014-01-02 MED ORDER — ENOXAPARIN SODIUM 40 MG/0.4ML ~~LOC~~ SOLN
40.0000 mg | SUBCUTANEOUS | Status: DC
  Administered 2014-01-02 – 2014-01-06 (×5): 40 mg via SUBCUTANEOUS
  Filled 2014-01-02 (×6): qty 0.4

## 2014-01-02 MED ORDER — MORPHINE SULFATE 2 MG/ML IJ SOLN: 1.0000 mg | INTRAMUSCULAR | Status: DC | PRN

## 2014-01-02 MED ORDER — ALBUTEROL SULFATE (2.5 MG/3ML) 0.083% IN NEBU: 2.5000 mg | INHALATION_SOLUTION | RESPIRATORY_TRACT | Status: DC | PRN

## 2014-01-02 MED ORDER — HYDROCODONE-ACETAMINOPHEN 5-325 MG PO TABS
1.0000 | ORAL_TABLET | ORAL | Status: DC | PRN
  Administered 2014-01-04: 2 via ORAL
  Filled 2014-01-02 (×2): qty 2

## 2014-01-02 MED ORDER — ONDANSETRON HCL 4 MG/2ML IJ SOLN: 4.0000 mg | Freq: Four times a day (QID) | INTRAMUSCULAR | Status: DC | PRN

## 2014-01-02 MED ORDER — INFLUENZA VAC SPLIT QUAD 0.5 ML IM SUSY
0.5000 mL | PREFILLED_SYRINGE | INTRAMUSCULAR | Status: AC
  Administered 2014-01-03: 0.5 mL via INTRAMUSCULAR
  Filled 2014-01-02: qty 0.5

## 2014-01-02 MED ORDER — HYPROMELLOSE (GONIOSCOPIC) 2.5 % OP SOLN: 1.0000 [drp] | Freq: Two times a day (BID) | OPHTHALMIC | Status: DC

## 2014-01-02 MED ORDER — CEFTRIAXONE SODIUM 1 G IJ SOLR: 1.0000 g | INTRAMUSCULAR | Status: DC

## 2014-01-02 MED ORDER — SODIUM CHLORIDE 0.9 % IV SOLN
INTRAVENOUS | Status: DC
  Administered 2014-01-02 – 2014-01-03 (×2): via INTRAVENOUS

## 2014-01-02 MED ORDER — VITAMIN D3 25 MCG (1000 UNIT) PO TABS
2000.0000 [IU] | ORAL_TABLET | Freq: Every day | ORAL | Status: DC
  Administered 2014-01-02 – 2014-01-07 (×6): 2000 [IU] via ORAL
  Filled 2014-01-02 (×6): qty 2

## 2014-01-02 MED ORDER — NITROGLYCERIN 0.4 MG SL SUBL: 0.4000 mg | SUBLINGUAL_TABLET | SUBLINGUAL | Status: DC | PRN

## 2014-01-02 MED ORDER — ONDANSETRON HCL 4 MG PO TABS: 4.0000 mg | ORAL_TABLET | Freq: Four times a day (QID) | ORAL | Status: DC | PRN

## 2014-01-02 MED ORDER — SODIUM CHLORIDE 0.9 % IV BOLUS (SEPSIS)
500.0000 mL | Freq: Once | INTRAVENOUS | Status: AC
  Administered 2014-01-02: 500 mL via INTRAVENOUS

## 2014-01-02 MED ORDER — DEXTROSE 5 % IV SOLN
1.0000 g | INTRAVENOUS | Status: DC
  Filled 2014-01-02: qty 10

## 2014-01-02 MED ORDER — ADULT MULTIVITAMIN W/MINERALS CH
1.0000 | ORAL_TABLET | Freq: Every day | ORAL | Status: DC
  Administered 2014-01-02 – 2014-01-07 (×6): 1 via ORAL
  Filled 2014-01-02 (×6): qty 1

## 2014-01-02 MED ORDER — GALANTAMINE HYDROBROMIDE ER 8 MG PO CP24
8.0000 mg | ORAL_CAPSULE | Freq: Every day | ORAL | Status: DC
  Administered 2014-01-03 – 2014-01-07 (×5): 8 mg via ORAL
  Filled 2014-01-02 (×6): qty 1

## 2014-01-02 MED ORDER — ACETAMINOPHEN 325 MG PO TABS
650.0000 mg | ORAL_TABLET | Freq: Once | ORAL | Status: AC
  Administered 2014-01-02: 650 mg via ORAL
  Filled 2014-01-02: qty 2

## 2014-01-02 MED ORDER — GUAIFENESIN-DM 100-10 MG/5ML PO SYRP
5.0000 mL | ORAL_SOLUTION | ORAL | Status: DC | PRN
  Filled 2014-01-02: qty 5

## 2014-01-02 MED ORDER — ASPIRIN EC 81 MG PO TBEC
81.0000 mg | DELAYED_RELEASE_TABLET | Freq: Every day | ORAL | Status: DC
Start: 2014-01-02 — End: 2014-01-07
  Administered 2014-01-02 – 2014-01-07 (×6): 81 mg via ORAL
  Filled 2014-01-02 (×6): qty 1

## 2014-01-02 MED ORDER — ISOSORBIDE MONONITRATE ER 60 MG PO TB24
60.0000 mg | ORAL_TABLET | Freq: Every morning | ORAL | Status: DC
  Administered 2014-01-03: 60 mg via ORAL
  Filled 2014-01-02 (×2): qty 1

## 2014-01-02 MED ORDER — ALUM & MAG HYDROXIDE-SIMETH 200-200-20 MG/5ML PO SUSP: 30.0000 mL | Freq: Four times a day (QID) | ORAL | Status: DC | PRN

## 2014-01-02 MED ORDER — CEFTRIAXONE SODIUM 1 G IJ SOLR
1.0000 g | INTRAMUSCULAR | Status: DC
  Administered 2014-01-03 – 2014-01-05 (×3): 1 g via INTRAVENOUS
  Filled 2014-01-02 (×3): qty 10

## 2014-01-02 MED ORDER — ACETAMINOPHEN 650 MG RE SUPP
650.0000 mg | Freq: Four times a day (QID) | RECTAL | Status: DC | PRN
  Administered 2014-01-02: 650 mg via RECTAL
  Filled 2014-01-02: qty 1

## 2014-01-02 MED ORDER — FAMOTIDINE 20 MG PO TABS
20.0000 mg | ORAL_TABLET | Freq: Two times a day (BID) | ORAL | Status: DC
  Administered 2014-01-02 – 2014-01-07 (×11): 20 mg via ORAL
  Filled 2014-01-02 (×12): qty 1

## 2014-01-02 MED ORDER — ATORVASTATIN CALCIUM 40 MG PO TABS
40.0000 mg | ORAL_TABLET | Freq: Every evening | ORAL | Status: DC
  Administered 2014-01-02 – 2014-01-06 (×5): 40 mg via ORAL
  Filled 2014-01-02 (×6): qty 1

## 2014-01-02 NOTE — H&P (Signed)
PATIENT DETAILS Name: Ronald [name] Age: 80 y.o. Sex: male Date of Birth: 02/02/1928 Admit Date: 01/02/2014 [name] [name], MD   CHIEF COMPLAINT:  Subjective fever, dysuria and lower, pain 3 days Worsening weakness for 2-3 days  HPI: Ronald [name] is a 80 y.o. male with a Past Medical History of coronary disease status post CABG, history of prostate cancer, recent cystoscopy with balloon dilatation of the urethral stricture and lithotripsy of large bladder calculus following which he required a Foley catheter that was just removed with his back who presents today with the above noted complaint. Her spouse, and patient discussed with his, patient has had significant worsening of his weakness. He has required much more assistance from his spouse. Patient also complains of subjective fever with chills. He has had frequent urination as well. She then presented to the emergency room today, where he was found to have UTI, I was subsequently asked to admit this patient for further evaluation and treatment. Please note, during my evaluation-patient was completely awake and alert, he denied any ongoing headache. In any chest pain or shortness of breath. Denied any nausea, vomiting or diarrhea. He also denied any upper abdominal pain, did have some lower abdominal discomfort.  ALLERGIES:   Allergies  Allergen Reactions  . Pantoprazole Other (See Comments)    Headache and lightheaded  . Ciprofloxacin Swelling and Hives    Allergy classified as swelling here, but pt says Cipro just doesn't work for him    . Nitrofurantoin Hives  . Sulfa Antibiotics Hives    PAST MEDICAL HISTORY: Past Medical History  Diagnosis Date  . Hyperlipidemia   . History of MI (myocardial infarction)   . Hypertension   . S/P CABG (coronary artery bypass graft)     [place]  . History of Bell's palsy     RIGHT SIDE-- NO RESIDUAL  . Gross hematuria   . Urethral stricture   .  History of prostate cancer UROLOGIST-  DR [place]-  S/P  RADIACTIVE SEED IMPLANTS  . History of kidney stones   . OAB (overactive bladder)   . Chronic cystitis   . Ischemic cardiomyopathy     ef 35-40% per cath 08-28-2013  . Coronary artery disease CARDIOLOGIST-  DR Grace Bushy  MI -- S/P  11/89 CABG x6 (70% circ; 90% PD; 60-70% distal left main; 30% left circ; 90% 1st diag;, 2nd diag and 3rd diag. w/90%,  mild stenosis LAD and 60-70% pLAD)  Re-do CABG x5 in 1997  . Arthritis   . History of nonmelanoma skin cancer     excision's from scalp  . Wears hearing aid     bilateral  . Wears partial dentures   . At risk for sleep apnea     STOP-BANG= 4    SENT TO PCP 12-23-2013  . GERD (gastroesophageal reflux disease)   . Mild dementia     PAST SURGICAL HISTORY: Past Surgical History  Procedure Laterality Date  . Eus  10/05/2011    Procedure: ESOPHAGEAL ENDOSCOPIC ULTRASOUND (EUS) RADIAL;  Surgeon: Arta Silence, MD;  Location: [facility];  Service: Endoscopy;  Laterality: N/A;  . Tonsillectomy and adenoidectomy  1954  . Laparoscopic cholecystectomy  12-23-2005  . Radioactive prostate seed implants  1998  . Coronary artery bypass graft  11/ 1989  &  10/ 1997    1989-- 6 vessel/  1997 Re-do  5 vessel  . Cardiac catheterization  02-22-2005  dr Vidal Schwalbe    mild to moderate lv dysfunction with inferobasilar akinesis/  totally occluded SVG to Intermediate Diagonal and SVG to PDA and RCA branches, totally occluded native coronary circulation with diffuse disease pLAD diagonal system with potentially could be ischemic/  patent SVG to OM with collaterals to dRCA and patent LIMA to LAD and diagonal systemss  . Cardiac catheterization  08-28-2013  DR Daneen Schick    widely patent sequential left internal graft to the diagonal/LAD, widely patent SVG to OM with proximal 50% narrowing noted in the graft, total occlusion SVG's to RCA, RI, and the Diagonal/ LV dysfunction with inferobasal  aneurysm and mid anterior wall region of akinesis/  overall EF 35-40%/  Total occlusion of the navtive circulation/  no significant change compared to 2006 cath  . Cardiovascular stress test  08-01-2011  dr Angelena Form    inferior scar and possible soft tissue attenuation with minimal peri-infarct ischemia, small region of anterior ischemia and scar/  LVEF 53% LV wall motion with inferior hypokinesis/ no significant change from scan july 2011  . Cataract extraction w/ intraocular lens  implant, bilateral    . Cystoscopy with urethral dilatation N/A 12/25/2013    Procedure: CYSTOSCOPY WITH URETHRAL DILATATION, WITH BIOPSY;  Surgeon: Bernestine Amass, MD;  Location: [facility];  Service: Urology;  Laterality: N/A;    MEDICATIONS AT HOME: Prior to Admission medications   Medication Sig Start Date End Date Taking? Authorizing Provider  acetaminophen (TYLENOL) 500 MG tablet Take 500 mg by mouth every 6 (six) hours as needed for pain or fever. Take 1 -2 pills   Yes Historical Provider, MD  aspirin EC 81 MG EC tablet Take 1 tablet (81 mg total) by mouth daily. 08/29/13  Yes Brittainy Erie Noe, PA-C  atorvastatin (LIPITOR) 80 MG tablet Take 40 mg by mouth every evening. 09/23/13  Yes Imogene Burn, PA-C  Calcium Carbonate-Vitamin D (CALCIUM + D PO) Take 1 tablet by mouth 2 (two) times daily.    Yes Historical Provider, MD  Cholecalciferol (VITAMIN D) 2000 UNITS tablet Take 2,000 Units by mouth daily.   Yes Historical Provider, MD  galantamine (RAZADYNE ER) 8 MG 24 hr capsule Take 8 mg by mouth daily with breakfast.   Yes Historical Provider, MD  HYDROcodone-acetaminophen (NORCO/VICODIN) 5-325 MG per tablet Take 1-2 tablets by mouth every 6 (six) hours as needed. 12/25/13  Yes Bernestine Amass, MD  hydroxypropyl methylcellulose (ISOPTO TEARS) 2.5 % ophthalmic solution Place 1 drop into both eyes 2 (two) times daily.    Yes Historical Provider, MD  ibuprofen (ADVIL,MOTRIN) 200 MG tablet Take 200 mg  by mouth every 6 (six) hours as needed.   Yes Historical Provider, MD  isosorbide mononitrate (IMDUR) 60 MG 24 hr tablet Take 60 mg by mouth every morning.   Yes Historical Provider, MD  Multiple Vitamin (MULTIVITAMIN WITH MINERALS) TABS Take 1 tablet by mouth daily.   Yes Historical Provider, MD  ranitidine (ZANTAC) 150 MG tablet Take 150 mg by mouth as needed for heartburn.   Yes Historical Provider, MD  silodosin (RAPAFLO) 8 MG CAPS capsule Take 8 mg by mouth every evening.    Yes Historical Provider, MD  nitroGLYCERIN (NITROSTAT) 0.4 MG SL tablet Place 0.4 mg under the tongue every 5 (five) minutes as needed for chest pain.     Historical Provider, MD    FAMILY HISTORY: Family History  Problem Relation Age of  Onset  . Heart disease Mother   . Diabetes Father   . Heart disease Brother    SOCIAL HISTORY:  reports that he quit smoking about 30 years ago. His smoking use included Cigars. His smokeless tobacco use includes Chew. He reports that he does not drink alcohol or use illicit drugs.  REVIEW OF SYSTEMS:  Constitutional:   No  weight loss, night sweats.  HEENT:    No headaches, Difficulty swallowing,Tooth/dental problems,Sore throat,   Cardio-vascular: No chest pain,  Orthopnea, PND, swelling in lower extremities, anasarca,  dizziness, palpitations  GI:  No heartburn, indigestion,  nausea, vomiting, diarrhea, change in    bowel habits, loss of appetite  Resp: No shortness of breath with exertion or at rest.  No excess mucus, no productive cough, No non-productive cough,  No coughing up of blood.  Skin:  no rash or lesions.  GU:  no dysuria, change in color of urine,  No flank pain.  Musculoskeletal: No joint pain or swelling.  No decreased range of motion.  No back pain.  Psych: No change in mood or affect. No depression or anxiety.  No memory loss.   PHYSICAL EXAM: Blood pressure 123/46, pulse 75, temperature 99 F (37.2 C), temperature source Oral, resp. rate  24, height 5\' 5"  (1.651 m), weight 71.215 kg (157 lb), SpO2 92.00%.  General appearance :Awake, alert, not in any distress. Speech Clear. Not toxic Looking HEENT: Atraumatic and Normocephalic, pupils equally reactive to light and accomodation Neck: supple, no JVD. No cervical lymphadenopathy.  Chest:Good air entry bilaterally, no added sounds  CVS: S1 S2 regular, no murmurs.  Abdomen: Bowel sounds present, Non tender and not distended with no gaurding, rigidity or rebound. Extremities: B/L Lower Ext shows no edema, both legs are warm to touch Neurology: Awake alert, and oriented X 3, CN II-XII intact, Non focal Skin:No Rash Wounds:N/A  LABS ON ADMISSION:   Recent Labs  01/01/14 2315  NA 135*  K 5.0  CL 100  CO2 19  GLUCOSE 131*  BUN 22  CREATININE 0.95  CALCIUM 8.8    Recent Labs  01/01/14 2315  AST 34  ALT 24  ALKPHOS 82  BILITOT 0.8  PROT 7.0  ALBUMIN 3.3*   No results found for this basename: LIPASE, AMYLASE,  in the last 72 hours  Recent Labs  01/01/14 2315  WBC 10.8*  NEUTROABS 9.2*  HGB 13.9  HCT 41.4  MCV 89.0  PLT 151   No results found for this basename: CKTOTAL, CKMB, CKMBINDEX, TROPONINI,  in the last 72 hours No results found for this basename: DDIMER,  in the last 72 hours No components found with this basename: POCBNP,    RADIOLOGIC STUDIES ON ADMISSION: No results found.   EKG: Independently reviewed. Normal sinus rhythm  ASSESSMENT AND PLAN: Present on Admission:  . Urinary tract infection - Recently had cystoscopy and lithotripsy of a bladder stone, following which he required a Foley catheterization. Foley catheter was removed 2 days back. UA consistent with UTI, will start IV Rocephin, admit to medical surgical unit, and follow cultures.   Marland Kitchen SIRS (systemic inflammatory response syndrome) - Likely secondary to above. Cautiously hydrate (? History of CHF) , IV Rocephin. Will follow blood and urine cultures.   Marland Kitchen HTN  (hypertension) - Cautiously continue with Imdur for now-we'll continue to monitor blood pressure readings and adjust accordingly.   Marland Kitchen HYPERCHOLESTEROLEMIA - Continue statins.   . Coronary atherosclerosis - Continue with aspirin, statin. Not on  beta blockers as an outpatient.   . Recent history of cystoscopy with urethral dilatation and lithotripsy of Bladder calculus - Appears stable at this point  Further plan will depend as patient's clinical course evolves and further radiologic and laboratory data become available. Patient will be monitored closely.  Above noted plan was discussed with patient/spouse, they were in agreement.   DVT Prophylaxis: Prophylactic Lovenox   Code Status: Full Code   Total time spent for admission equals 45 minutes.  [facility] Pager [phone]  If 7PM-7AM, please contact night-coverage [email or web address] Password [email or web address] 01/02/2014, 7:53 AM  **Disclaimer: This note may have been dictated with voice recognition software. Similar sounding words can inadvertently be transcribed and this note may contain transcription errors which may not have been corrected upon publication of note.**

## 2014-01-02 NOTE — ED Notes (Signed)
Family at bedside. 

## 2014-01-02 NOTE — ED Provider Notes (Signed)
CSN: [id number]     Arrival date & time 01/01/14  2236 History   First MD Initiated Contact with Patient 01/02/14 0239     Chief Complaint  Patient presents with  . Weakness     (Consider location/radiation/quality/duration/timing/severity/associated sxs/prior Treatment) Patient is a 80 y.o. male presenting with general illness.  Illness Quality:  Generalized weakness Severity:  Moderate Onset quality:  Gradual Duration:  2 days Timing:  Constant Progression:  Worsening Chronicity:  New Context:  Foley catheter removes a few days ago Relieved by:  Nothing Worsened by:  Nothing  Associated symptoms: myalgias   Associated symptoms: no abdominal pain, no chest pain and no shortness of breath   Associated symptoms comment:  No focal weakness.  No fevers, but sweats and chills   Past Medical History  Diagnosis Date  . Hyperlipidemia   . History of MI (myocardial infarction)   . Hypertension   . S/P CABG (coronary artery bypass graft)     [place]  . History of Bell's palsy     RIGHT SIDE-- NO RESIDUAL  . Gross hematuria   . Urethral stricture   . History of prostate cancer UROLOGIST-  DR [place]-  S/P  RADIACTIVE SEED IMPLANTS  . History of kidney stones   . OAB (overactive bladder)   . Chronic cystitis   . Ischemic cardiomyopathy     ef 35-40% per cath 08-28-2013  . Coronary artery disease CARDIOLOGIST-  DR Grace Bushy  MI -- S/P  11/89 CABG x6 (70% circ; 90% PD; 60-70% distal left main; 30% left circ; 90% 1st diag;, 2nd diag and 3rd diag. w/90%,  mild stenosis LAD and 60-70% pLAD)  Re-do CABG x5 in 1997  . Arthritis   . History of nonmelanoma skin cancer     excision's from scalp  . Wears hearing aid     bilateral  . Wears partial dentures   . At risk for sleep apnea     STOP-BANG= 4    SENT TO PCP 12-23-2013  . GERD (gastroesophageal reflux disease)   . Mild dementia    Past Surgical History  Procedure Laterality Date  . Eus  10/05/2011     Procedure: ESOPHAGEAL ENDOSCOPIC ULTRASOUND (EUS) RADIAL;  Surgeon: Arta Silence, MD;  Location: [facility];  Service: Endoscopy;  Laterality: N/A;  . Tonsillectomy and adenoidectomy  1954  . Laparoscopic cholecystectomy  12-23-2005  . Radioactive prostate seed implants  1998  . Coronary artery bypass graft  11/ 1989  &  10/ 1997    1989-- 6 vessel/  1997 Re-do 5 vessel  . Cardiac catheterization  02-22-2005  dr Vidal Schwalbe    mild to moderate lv dysfunction with inferobasilar akinesis/  totally occluded SVG to Intermediate Diagonal and SVG to PDA and RCA branches, totally occluded native coronary circulation with diffuse disease pLAD diagonal system with potentially could be ischemic/  patent SVG to OM with collaterals to dRCA and patent LIMA to LAD and diagonal systemss  . Cardiac catheterization  08-28-2013  DR Daneen Schick    widely patent sequential left internal graft to the diagonal/LAD, widely patent SVG to OM with proximal 50% narrowing noted in the graft, total occlusion SVG's to RCA, RI, and the Diagonal/ LV dysfunction with inferobasal aneurysm and mid anterior wall region of akinesis/  overall EF 35-40%/  Total occlusion of the navtive circulation/  no significant change compared to 2006 cath  .  Cardiovascular stress test  08-01-2011  dr Angelena Form    inferior scar and possible soft tissue attenuation with minimal peri-infarct ischemia, small region of anterior ischemia and scar/  LVEF 53% LV wall motion with inferior hypokinesis/ no significant change from scan july 2011  . Cataract extraction w/ intraocular lens  implant, bilateral    . Cystoscopy with urethral dilatation N/A 12/25/2013    Procedure: CYSTOSCOPY WITH URETHRAL DILATATION, WITH BIOPSY;  Surgeon: Bernestine Amass, MD;  Location: [facility];  Service: Urology;  Laterality: N/A;   Family History  Problem Relation Age of Onset  . Heart disease Mother   . Diabetes Father   . Heart disease Brother    History   Substance Use Topics  . Smoking status: Former Smoker -- 40 years    Types: Cigars    Quit date: 12/24/1983  . Smokeless tobacco: Current User    Types: Chew  . Alcohol Use: No    Review of Systems  Respiratory: Negative for shortness of breath.   Cardiovascular: Negative for chest pain.  Gastrointestinal: Negative for abdominal pain.  Musculoskeletal: Positive for myalgias.  All other systems reviewed and are negative.     Allergies  Pantoprazole; Ciprofloxacin; Nitrofurantoin; and Sulfa antibiotics  Home Medications   Prior to Admission medications   Medication Sig Start Date End Date Taking? Authorizing Provider  acetaminophen (TYLENOL) 500 MG tablet Take 500 mg by mouth every 6 (six) hours as needed for pain or fever. Take 1 -2 pills   Yes Historical Provider, MD  aspirin EC 81 MG EC tablet Take 1 tablet (81 mg total) by mouth daily. 08/29/13  Yes Brittainy Erie Noe, PA-C  atorvastatin (LIPITOR) 80 MG tablet Take 40 mg by mouth every evening. 09/23/13  Yes Imogene Burn, PA-C  Calcium Carbonate-Vitamin D (CALCIUM + D PO) Take 1 tablet by mouth 2 (two) times daily.    Yes Historical Provider, MD  Cholecalciferol (VITAMIN D) 2000 UNITS tablet Take 2,000 Units by mouth daily.   Yes Historical Provider, MD  galantamine (RAZADYNE ER) 8 MG 24 hr capsule Take 8 mg by mouth daily with breakfast.   Yes Historical Provider, MD  HYDROcodone-acetaminophen (NORCO/VICODIN) 5-325 MG per tablet Take 1-2 tablets by mouth every 6 (six) hours as needed. 12/25/13  Yes Bernestine Amass, MD  hydroxypropyl methylcellulose (ISOPTO TEARS) 2.5 % ophthalmic solution Place 1 drop into both eyes 2 (two) times daily.    Yes Historical Provider, MD  ibuprofen (ADVIL,MOTRIN) 200 MG tablet Take 200 mg by mouth every 6 (six) hours as needed.   Yes Historical Provider, MD  isosorbide mononitrate (IMDUR) 60 MG 24 hr tablet Take 60 mg by mouth every morning.   Yes Historical Provider, MD  Multiple Vitamin  (MULTIVITAMIN WITH MINERALS) TABS Take 1 tablet by mouth daily.   Yes Historical Provider, MD  ranitidine (ZANTAC) 150 MG tablet Take 150 mg by mouth as needed for heartburn.   Yes Historical Provider, MD  silodosin (RAPAFLO) 8 MG CAPS capsule Take 8 mg by mouth every evening.    Yes Historical Provider, MD  nitroGLYCERIN (NITROSTAT) 0.4 MG SL tablet Place 0.4 mg under the tongue every 5 (five) minutes as needed for chest pain.     Historical Provider, MD   BP 121/68  Pulse 86  Temp(Src) 101.1 F (38.4 C) (Rectal)  Resp 22  Ht 5\' 5"  (1.651 m)  Wt 157 lb (71.215 kg)  BMI 26.13 kg/m2  SpO2 98% Physical Exam  Nursing note and vitals reviewed. Constitutional: He is oriented to person, place, and time. He appears well-developed and well-nourished. No distress.  HENT:  Head: Normocephalic and atraumatic.  Mouth/Throat: Oropharynx is clear and moist.  Eyes: Conjunctivae are normal. Pupils are equal, round, and reactive to light. No scleral icterus.  Neck: Neck supple.  Cardiovascular: Normal rate, regular rhythm, normal heart sounds and intact distal pulses.   No murmur heard. Pulmonary/Chest: Effort normal and breath sounds normal. No stridor. No respiratory distress. He has no wheezes. He has no rales.  Abdominal: Soft. He exhibits no distension. There is no tenderness. There is no rebound and no guarding.  Genitourinary: Rectal exam shows no mass and no tenderness. Prostate is not tender. Right testis shows no mass and no tenderness. Left testis shows no mass and no tenderness. Uncircumcised.  Leaking urinary incontinence  Musculoskeletal: Normal range of motion. He exhibits no edema.  Neurological: He is alert and oriented to person, place, and time.  Strength intact and symmetric   Skin: Skin is warm and dry. No rash noted.  Psychiatric: He has a normal mood and affect. His behavior is normal.    ED Course  Procedures (including critical care time) Labs Review Labs Reviewed  CBC  WITH DIFFERENTIAL - Abnormal; Notable for the following:    WBC 10.8 (*)    Neutrophils Relative % 85 (*)    Lymphocytes Relative 2 (*)    Monocytes Relative 13 (*)    Neutro Abs 9.2 (*)    Lymphs Abs 0.2 (*)    Monocytes Absolute 1.4 (*)    All other components within normal limits  COMPREHENSIVE METABOLIC PANEL - Abnormal; Notable for the following:    Sodium 135 (*)    Glucose, Bld 131 (*)    Albumin 3.3 (*)    GFR calc non Af Amer 74 (*)    GFR calc Af Amer 86 (*)    Anion gap 16 (*)    All other components within normal limits  URINALYSIS, ROUTINE W REFLEX MICROSCOPIC - Abnormal; Notable for the following:    APPearance CLOUDY (*)    Hgb urine dipstick MODERATE (*)    Leukocytes, UA MODERATE (*)    All other components within normal limits  URINE MICROSCOPIC-ADD ON - Abnormal; Notable for the following:    Bacteria, UA FEW (*)    All other components within normal limits    Imaging Review No results found.   EKG Interpretation   Date/Time:  Thursday January 02 2014 04:06:49 EDT Ventricular Rate:  74 PR Interval:    QRS Duration: 104 QT Interval:  388 QTC Calculation: 430 R Axis:   -23 Text Interpretation:  Sinus rhythm Borderline left axis deviation Abnormal  R-wave progression, early transition Nonspecific T abnormalities, lateral  leads No significant change was found Confirmed by [facility]  MD, TREY  ([id number]) on 01/03/2014 3:47:22 PM      MDM   Final diagnoses:  SIRS (systemic inflammatory response syndrome)  Essential hypertension  Urinary tract infection associated with catheterization of urinary tract, initial encounter  Chronic systolic CHF (congestive heart failure)  Pure hypercholesterolemia    80 yo male with generalized weakness found to be febrile.  I suspect illness from UTI.  Rocephin ordered.  Admitted to hospitalist service.     Artis Delay, MD 01/03/14 [phone]

## 2014-01-02 NOTE — ED Notes (Signed)
Called admitting MD about pt's temperature spike. Pt has been given 650mg  of Tylenol and MD agreed with plan of care. Pt alert and oriented at this time.

## 2014-01-02 NOTE — Progress Notes (Signed)
Called and tried to get report. No answer.

## 2014-01-02 NOTE — ED Notes (Signed)
Admitting doctor at bedside 

## 2014-01-03 DIAGNOSIS — Z8546 Personal history of malignant neoplasm of prostate: Secondary | ICD-10-CM | POA: Diagnosis not present

## 2014-01-03 DIAGNOSIS — A419 Sepsis, unspecified organism: Secondary | ICD-10-CM | POA: Diagnosis not present

## 2014-01-03 DIAGNOSIS — E785 Hyperlipidemia, unspecified: Secondary | ICD-10-CM | POA: Diagnosis present

## 2014-01-03 DIAGNOSIS — N390 Urinary tract infection, site not specified: Secondary | ICD-10-CM | POA: Diagnosis not present

## 2014-01-03 DIAGNOSIS — E878 Other disorders of electrolyte and fluid balance, not elsewhere classified: Secondary | ICD-10-CM | POA: Diagnosis not present

## 2014-01-03 DIAGNOSIS — Z951 Presence of aortocoronary bypass graft: Secondary | ICD-10-CM | POA: Diagnosis not present

## 2014-01-03 DIAGNOSIS — N359 Urethral stricture, unspecified: Secondary | ICD-10-CM | POA: Diagnosis not present

## 2014-01-03 DIAGNOSIS — Z889 Allergy status to unspecified drugs, medicaments and biological substances status: Secondary | ICD-10-CM | POA: Diagnosis not present

## 2014-01-03 DIAGNOSIS — N401 Enlarged prostate with lower urinary tract symptoms: Secondary | ICD-10-CM | POA: Diagnosis present

## 2014-01-03 DIAGNOSIS — Z882 Allergy status to sulfonamides status: Secondary | ICD-10-CM | POA: Diagnosis not present

## 2014-01-03 DIAGNOSIS — N210 Calculus in bladder: Secondary | ICD-10-CM | POA: Diagnosis not present

## 2014-01-03 DIAGNOSIS — I10 Essential (primary) hypertension: Secondary | ICD-10-CM | POA: Diagnosis not present

## 2014-01-03 DIAGNOSIS — N4889 Other specified disorders of penis: Secondary | ICD-10-CM | POA: Diagnosis not present

## 2014-01-03 DIAGNOSIS — Z452 Encounter for adjustment and management of vascular access device: Secondary | ICD-10-CM | POA: Diagnosis not present

## 2014-01-03 DIAGNOSIS — Z833 Family history of diabetes mellitus: Secondary | ICD-10-CM | POA: Diagnosis not present

## 2014-01-03 DIAGNOSIS — I2510 Atherosclerotic heart disease of native coronary artery without angina pectoris: Secondary | ICD-10-CM | POA: Diagnosis not present

## 2014-01-03 DIAGNOSIS — B952 Enterococcus as the cause of diseases classified elsewhere: Secondary | ICD-10-CM | POA: Diagnosis not present

## 2014-01-03 DIAGNOSIS — I5022 Chronic systolic (congestive) heart failure: Secondary | ICD-10-CM | POA: Diagnosis not present

## 2014-01-03 DIAGNOSIS — Z720 Tobacco use: Secondary | ICD-10-CM | POA: Diagnosis not present

## 2014-01-03 DIAGNOSIS — N3281 Overactive bladder: Secondary | ICD-10-CM | POA: Diagnosis present

## 2014-01-03 DIAGNOSIS — R509 Fever, unspecified: Secondary | ICD-10-CM | POA: Diagnosis present

## 2014-01-03 DIAGNOSIS — I517 Cardiomegaly: Secondary | ICD-10-CM | POA: Diagnosis not present

## 2014-01-03 DIAGNOSIS — Z8249 Family history of ischemic heart disease and other diseases of the circulatory system: Secondary | ICD-10-CM | POA: Diagnosis not present

## 2014-01-03 DIAGNOSIS — K219 Gastro-esophageal reflux disease without esophagitis: Secondary | ICD-10-CM | POA: Diagnosis present

## 2014-01-03 DIAGNOSIS — Z85820 Personal history of malignant melanoma of skin: Secondary | ICD-10-CM | POA: Diagnosis not present

## 2014-01-03 DIAGNOSIS — E780 Pure hypercholesterolemia: Secondary | ICD-10-CM | POA: Diagnosis not present

## 2014-01-03 DIAGNOSIS — D696 Thrombocytopenia, unspecified: Secondary | ICD-10-CM | POA: Diagnosis present

## 2014-01-03 DIAGNOSIS — I255 Ischemic cardiomyopathy: Secondary | ICD-10-CM | POA: Diagnosis present

## 2014-01-03 DIAGNOSIS — T8351XA Infection and inflammatory reaction due to indwelling urinary catheter, initial encounter: Secondary | ICD-10-CM | POA: Diagnosis not present

## 2014-01-03 DIAGNOSIS — Z881 Allergy status to other antibiotic agents status: Secondary | ICD-10-CM | POA: Diagnosis not present

## 2014-01-03 DIAGNOSIS — F0390 Unspecified dementia without behavioral disturbance: Secondary | ICD-10-CM | POA: Diagnosis present

## 2014-01-03 DIAGNOSIS — Z7982 Long term (current) use of aspirin: Secondary | ICD-10-CM | POA: Diagnosis not present

## 2014-01-03 DIAGNOSIS — A4181 Sepsis due to Enterococcus: Secondary | ICD-10-CM | POA: Diagnosis present

## 2014-01-03 DIAGNOSIS — I252 Old myocardial infarction: Secondary | ICD-10-CM | POA: Diagnosis not present

## 2014-01-03 DIAGNOSIS — R7881 Bacteremia: Secondary | ICD-10-CM | POA: Diagnosis not present

## 2014-01-03 LAB — URINE CULTURE: Colony Count: 80000

## 2014-01-03 LAB — CBC
HCT: 42.5 % (ref 39.0–52.0)
Hemoglobin: 14.3 g/dL (ref 13.0–17.0)
MCH: 30 pg (ref 26.0–34.0)
MCHC: 33.6 g/dL (ref 30.0–36.0)
MCV: 89.3 fL (ref 78.0–100.0)
Platelets: 98 10*3/uL — ABNORMAL LOW (ref 150–400)
RBC: 4.76 MIL/uL (ref 4.22–5.81)
RDW: 13.1 % (ref 11.5–15.5)
WBC: 13.4 10*3/uL — ABNORMAL HIGH (ref 4.0–10.5)

## 2014-01-03 LAB — BASIC METABOLIC PANEL
Anion gap: 11 (ref 5–15)
BUN: 18 mg/dL (ref 6–23)
CO2: 24 mEq/L (ref 19–32)
Calcium: 8.3 mg/dL — ABNORMAL LOW (ref 8.4–10.5)
Chloride: 102 mEq/L (ref 96–112)
Creatinine, Ser: 1.1 mg/dL (ref 0.50–1.35)
GFR calc Af Amer: 69 mL/min — ABNORMAL LOW (ref >90)
GFR calc non Af Amer: 60 mL/min — ABNORMAL LOW (ref >90)
Glucose, Bld: 122 mg/dL — ABNORMAL HIGH (ref 70–99)
Potassium: 3.7 mEq/L (ref 3.7–5.3)
Sodium: 137 mEq/L (ref 137–147)

## 2014-01-03 MED ORDER — TAMSULOSIN HCL 0.4 MG PO CAPS
0.4000 mg | ORAL_CAPSULE | Freq: Every day | ORAL | Status: DC
  Administered 2014-01-03 – 2014-01-07 (×5): 0.4 mg via ORAL
  Filled 2014-01-03 (×5): qty 1

## 2014-01-03 MED ORDER — ISOSORBIDE MONONITRATE ER 30 MG PO TB24
30.0000 mg | ORAL_TABLET | Freq: Every morning | ORAL | Status: DC
  Administered 2014-01-04 – 2014-01-07 (×4): 30 mg via ORAL
  Filled 2014-01-03 (×4): qty 1

## 2014-01-03 NOTE — Progress Notes (Signed)
Patient ID: Ronald [name], male   DOB: [birth year], 80 y.o.   MRN: [id number]   Subjective: Patient reports feeling better today. Increase strength. He has been able to ambulate. Has been afebrile today.  Objective: Vital signs in last 24 hours: Temp:  [97.9 F (36.6 C)-99.9 F (37.7 C)] 97.9 F (36.6 C) (10/09 1407) Pulse Rate:  [70-78] 77 (10/09 1407) Resp:  [18-20] 18 (10/09 1407) BP: (92-146)/(55-72) 98/58 mmHg (10/09 1413) SpO2:  [95 %-96 %] 95 % (10/09 1407)  Intake/Output from previous day: 10/08 0701 - 10/09 0700 In: -  Out: 450 [Urine:450] Intake/Output this shift: Total I/O In: 580 [P.O.:580] Out: 200 [Urine:200]  Physical Exam:  Constitutional: Vital signs reviewed. WD WN in NAD   Eyes: PERRL, No scleral icterus.   Cardiovascular: RRR Pulmonary/Chest: Normal effort Abdominal: Soft. Non-tender, non-distended, bowel sounds are normal, no masses, organomegaly, or guarding present.  Genitourinary: Condom catheter draining clear urine Extremities: No cyanosis or edema   Lab Results:  Recent Labs  01/01/14 2315 01/03/14 0537  HGB 13.9 14.3  HCT 41.4 42.5   BMET  Recent Labs  01/01/14 2315 01/03/14 0537  NA 135* 137  K 5.0 3.7  CL 100 102  CO2 19 24  GLUCOSE 131* 122*  BUN 22 18  CREATININE 0.95 1.10  CALCIUM 8.8 8.3*   No results found for this basename: LABPT, INR,  in the last 72 hours No results found for this basename: LABURIN,  in the last 72 hours Results for orders placed during the hospital encounter of 01/02/14  URINE CULTURE     Status: None   Collection Time    01/02/14  6:04 AM      Result Value Ref Range Status   Specimen Description URINE, CLEAN CATCH   Final   Special Requests NONE   Final   Culture  Setup Time     Final   Value: 05-16-202015 08:53     Performed at [place]     Final   Value: 80,000 COLONIES/ML     Performed at [facility]   Culture     Final   Value: Multiple bacterial  morphotypes present, none predominant. Suggest appropriate recollection if clinically indicated.     Performed at [facility]   Report Status 01/03/2014 FINAL   Final  CULTURE, BLOOD (ROUTINE X 2)     Status: None   Collection Time    01/02/14  8:35 AM      Result Value Ref Range Status   Specimen Description BLOOD RIGHT ANTECUBITAL   Final   Special Requests BOTTLES DRAWN AEROBIC AND ANAEROBIC 10MLS   Final   Culture  Setup Time     Final   Value: 05-16-202015 12:56     Performed at [facility]   Culture     Final   Value:        BLOOD CULTURE RECEIVED NO GROWTH TO DATE CULTURE WILL BE HELD FOR 5 DAYS BEFORE ISSUING A FINAL NEGATIVE REPORT     Performed at [facility]   Report Status PENDING   Incomplete  CULTURE, BLOOD (ROUTINE X 2)     Status: None   Collection Time    01/02/14  8:45 AM      Result Value Ref Range Status   Specimen Description BLOOD HAND RIGHT   Final   Special Requests BOTTLES DRAWN AEROBIC AND ANAEROBIC 10CC   Final  Culture  Setup Time     Final   Value: 09/28/202015 12:56     Performed at [facility]   Culture     Final   Value:        BLOOD CULTURE RECEIVED NO GROWTH TO DATE CULTURE WILL BE HELD FOR 5 DAYS BEFORE ISSUING A FINAL NEGATIVE REPORT     Performed at [facility]   Report Status PENDING   Incomplete    Studies/Results: No results found.  Assessment/Plan:   Fever and chills with weakness several days status post cystoscopic treatment of urethral stricture and multiple bladder/prostatic urethral calculi  Urine culture shows multiple species without definitive uropathogen. Blood cultures thus far are negative. Patient clinically better on Rocephin.  Overall he appears to be voiding reasonably well status post the procedure. He will be discharged hopefully the next couple of days on oral antibiotics. Followup is artery been set up for our office.   LOS: 1 day   Pa Tennant S 01/03/2014, 6:21  PM

## 2014-01-03 NOTE — Evaluation (Signed)
Physical Therapy Evaluation Patient Details Name: Ronald [name] MRN: [id number] DOB: February 22, 1928 Today's Date: 01/03/2014   History of Present Illness  Patient admitted with weakness, fever, and pain following cystoscopy with lithotripsy and indwelling catheter few days. Has h/o CABG, prostate CA and CAD.  Clinical Impression  Patient presents with decreased independence and safety with gait and stairs.  He will benefit from skilled PT in the acute setting to allow return home with wife assist and follow up outpatient PT at [facility] for balance/gait training.      Follow Up Recommendations Outpatient PT    Equipment Recommendations  Cane    Recommendations for Other Services       Precautions / Restrictions Precautions Precautions: Fall Precaution Comments: has had 1 fall in 6 months.  Wife reports he tends to lean forward and get going too fast.      Mobility  Bed Mobility Overal bed mobility: Modified Independent                Transfers Overall transfer level: Needs assistance   Transfers: Sit to/from Stand Sit to Stand: Supervision         General transfer comment: cues for safety esp with condom cath  Ambulation/Gait Ambulation/Gait assistance: Supervision;Min guard Ambulation Distance (Feet): 180 Feet Assistive device: None Gait Pattern/deviations: Step-through pattern;Shuffle;Drifts right/left     General Gait Details: anterior bias and occasional drifts from straight path, worse with head turns  Stairs Stairs: Yes Stairs assistance: Min guard Stair Management: One rail Right Number of Stairs: 3 General stair comments: demonstrated step through pattern initially, and seemed unsafe, instructed in step to sequence and patient return demonstrated  Wheelchair Mobility    Modified Rankin (Stroke Patients Only)       Balance Overall balance assessment: History of Falls;Needs assistance   Sitting balance-Leahy Scale: Good     Standing  balance support: No upper extremity supported Standing balance-Leahy Scale: Fair Standing balance comment: demonstrated fair static balance, but worsens with gait with forward leaning                             Pertinent Vitals/Pain Pain Assessment: No/denies pain    Home Living Family/patient expects to be discharged to:: Private residence Living Arrangements: Spouse/significant other Available Help at Discharge: Family;Available 24 hours/day Type of Home: House Home Access: Stairs to enter Entrance Stairs-Rails: Psychiatric nurse of Steps: 3 Home Layout: One level Home Equipment: Other (comment) (walking stick) Additional Comments: wife reports patient likes to do yardwork and garden and has had son helping him lately    Prior Function Level of Independence: Independent;Independent with assistive device(s)               Hand Dominance        Extremity/Trunk Assessment               Lower Extremity Assessment: Overall WFL for tasks assessed      Cervical / Trunk Assessment: Kyphotic  Communication   Communication: No difficulties  Cognition Arousal/Alertness: Awake/alert Behavior During Therapy: WFL for tasks assessed/performed Overall Cognitive Status: History of cognitive impairments - at baseline                      General Comments      Exercises        Assessment/Plan    PT Assessment    PT Diagnosis Abnormality of gait;Generalized weakness   PT  Problem List    PT Treatment Interventions     PT Goals (Current goals can be found in the Care Plan section) Acute Rehab PT Goals Patient Stated Goal: To return to independent PT Goal Formulation: With patient/family Time For Goal Achievement: 01/10/14 Potential to Achieve Goals: Good    Frequency     Barriers to discharge        Co-evaluation               End of Session Equipment Utilized During Treatment: Gait belt Activity Tolerance:  Patient tolerated treatment well Patient left: in bed;with call bell/phone within reach;with bed alarm set;with family/visitor present      Functional Assessment Tool Used: Clinical Judgement Functional Limitation: Mobility: Walking and moving around Mobility: Walking and Moving Around Current Status [id number]): At least 20 percent but less than 40 percent impaired, limited or restricted Mobility: Walking and Moving Around Goal Status [phone]): At least 1 percent but less than 20 percent impaired, limited or restricted    Time: 1035-1104 PT Time Calculation (min): 29 min   Charges:   PT Evaluation $Initial PT Evaluation Tier I: 1 Procedure PT Treatments $Gait Training: 8-22 mins   PT G Codes:   Functional Assessment Tool Used: Clinical Judgement Functional Limitation: Mobility: Walking and moving around    [facility] 01/03/2014, 1:45 PM [place], [place] [phone] 01/03/2014

## 2014-01-03 NOTE — Progress Notes (Signed)
Utilization review completed.  

## 2014-01-03 NOTE — Progress Notes (Signed)
PATIENT DETAILS Name: Ronald [name] Age: 80 y.o. Sex: male Date of Birth: 1927-11-09 Admit Date: 01/02/2014 Admitting Physician Evalee Mutton Kristeen Mans, MD [name] [name], MD  Subjective: Feels much better, claims to have more "energy"  Assessment/Plan: Principal Problem:   Urinary tract infection -Recently had cystoscopy and lithotripsy of a bladder stone, following which he required a Foley catheterization. Foley catheter was removed 2 days back.Suspect Catheter related UTI -improved clinically-c/w Rocephin-day 2, await Urine/blood culture. Still febrile overnight, mild leukocytosis persists-but clinically looks better than on admission.   Active Problems: SIR's -secondary to above -c/w IV Rocephin, will stop IVF  HTN (hypertension)  - BP-Controlled- continue with Imdur for now-we'll continue to monitor blood pressure readings and adjust accordingly.   HYPERCHOLESTEROLEMIA  - Continue statins.   Coronary atherosclerosis  - Continue with aspirin, statin. Not on beta blockers as an outpatient.   Recent history of cystoscopy with urethral dilatation and lithotripsy of Bladder calculus -will need Urology follow up post discharge  Thrombocytopenia -appears chronic-watch platelet while on prophylactic Lovenox  BPH -c/w Flomax  Hx of Prostate Cancer -outpatient Urology follow up  Chronic Systolic CHF -compensated-EF around 35-40 percent by Cardiac Cath June 2015  Disposition: Remain inpatient  DVT Prophylaxis: Prophylactic Lovenox -watch platelets  Code Status: Full code   Family Communication Spouse on admission  Procedures:  None  CONSULTS:  None  Time spent 40 minutes-which includes 50% of the time with face-to-face with patient/ family and coordinating care related to the above assessment and plan.    MEDICATIONS: Scheduled Meds: . aspirin EC  81 mg Oral Daily  . atorvastatin  40 mg Oral QPM  . cefTRIAXone (ROCEPHIN)  IV  1 g  Intravenous Q24H  . cholecalciferol  2,000 Units Oral Daily  . enoxaparin (LOVENOX) injection  40 mg Subcutaneous Q24H  . famotidine  20 mg Oral BID  . galantamine  8 mg Oral Q breakfast  . Influenza vac split quadrivalent PF  0.5 mL Intramuscular Tomorrow-1000  . isosorbide mononitrate  60 mg Oral q morning - 10a  . multivitamin with minerals  1 tablet Oral Daily  . polyvinyl alcohol  1 drop Both Eyes BID  . tamsulosin  0.4 mg Oral Daily   Continuous Infusions:  PRN Meds:.acetaminophen, acetaminophen, albuterol, alum & mag hydroxide-simeth, guaiFENesin-dextromethorphan, HYDROcodone-acetaminophen, morphine injection, nitroGLYCERIN, ondansetron (ZOFRAN) IV, ondansetron  Antibiotics: Anti-infectives   Start     Dose/Rate Route Frequency Ordered Stop   01/03/14 0600  cefTRIAXone (ROCEPHIN) 1 g in dextrose 5 % 50 mL IVPB     1 g 100 mL/hr over 30 Minutes Intravenous Every 24 hours 01/02/14 1658     01/02/14 1800  cefTRIAXone (ROCEPHIN) 1 g in dextrose 5 % 50 mL IVPB  Status:  Discontinued     1 g 100 mL/hr over 30 Minutes Intravenous Every 24 hours 01/02/14 1657 01/02/14 1658   01/02/14 1245  cefTRIAXone (ROCEPHIN) 1 g in dextrose 5 % 50 mL IVPB  Status:  Discontinued     1 g 100 mL/hr over 30 Minutes Intravenous Every 24 hours 01/02/14 1232 01/02/14 1657   01/02/14 0600  cefTRIAXone (ROCEPHIN) 1 g in dextrose 5 % 50 mL IVPB     1 g 100 mL/hr over 30 Minutes Intravenous  Once 01/02/14 0554 01/02/14 0648       PHYSICAL EXAM: Vital signs in last 24 hours: Filed Vitals:   01/02/14 1352 01/02/14 1445 01/02/14 2127 01/03/14 0522  BP: 118/49 112/46 138/60 146/72  Pulse: 77 66 74 70  Temp: 101.9 F (38.8 C) 99.4 F (37.4 C) 99.4 F (37.4 C) 99.9 F (37.7 C)  TempSrc: Rectal Oral Oral Oral  Resp: 20 18 18 20   Height:  5\' 5"  (1.651 m)    Weight:  70.3 kg (154 lb 15.7 oz)    SpO2: 93% 95% 95% 95%    Weight change: -0.915 kg (-2 lb 0.3 oz) Filed Weights   01/01/14 2245 01/02/14  1445  Weight: 71.215 kg (157 lb) 70.3 kg (154 lb 15.7 oz)   Body mass index is 25.79 kg/(m^2).   Gen Exam: Awake and alert with clear speech.   Neck: Supple, No JVD.   Chest: B/L Clear.   CVS: S1 S2 Regular, no murmurs.  Abdomen: soft, BS +, non tender, non distended.  Extremities: no edema, lower extremities warm to touch. Neurologic: Non Focal.   Skin: No Rash.   Wounds: N/A.   Intake/Output from previous day:  Intake/Output Summary (Last 24 hours) at 01/03/14 0853 Last data filed at 01/03/14 0528  Gross per 24 hour  Intake      0 ml  Output    450 ml  Net   -450 ml     LAB RESULTS: CBC  Recent Labs Lab 01/01/14 2315 01/03/14 0537  WBC 10.8* 13.4*  HGB 13.9 14.3  HCT 41.4 42.5  PLT 151 98*  MCV 89.0 89.3  MCH 29.9 30.0  MCHC 33.6 33.6  RDW 13.0 13.1  LYMPHSABS 0.2*  --   MONOABS 1.4*  --   EOSABS 0.0  --   BASOSABS 0.0  --     Chemistries   Recent Labs Lab 01/01/14 2315 01/03/14 0537  NA 135* 137  K 5.0 3.7  CL 100 102  CO2 19 24  GLUCOSE 131* 122*  BUN 22 18  CREATININE 0.95 1.10  CALCIUM 8.8 8.3*    CBG: No results found for this basename: GLUCAP,  in the last 168 hours  GFR Estimated Creatinine Clearance: 43.5 ml/min (by C-G formula based on Cr of 1.1).  Coagulation profile No results found for this basename: INR, PROTIME,  in the last 168 hours  Cardiac Enzymes No results found for this basename: CK, CKMB, TROPONINI, MYOGLOBIN,  in the last 168 hours  No components found with this basename: POCBNP,  No results found for this basename: DDIMER,  in the last 72 hours No results found for this basename: HGBA1C,  in the last 72 hours No results found for this basename: CHOL, HDL, LDLCALC, TRIG, CHOLHDL, LDLDIRECT,  in the last 72 hours No results found for this basename: TSH, T4TOTAL, FREET3, T3FREE, THYROIDAB,  in the last 72 hours No results found for this basename: VITAMINB12, FOLATE, FERRITIN, TIBC, IRON, RETICCTPCT,  in the last 72  hours No results found for this basename: LIPASE, AMYLASE,  in the last 72 hours  Urine Studies No results found for this basename: UACOL, UAPR, USPG, UPH, UTP, UGL, UKET, UBIL, UHGB, UNIT, UROB, ULEU, UEPI, UWBC, URBC, UBAC, CAST, CRYS, UCOM, BILUA,  in the last 72 hours  MICROBIOLOGY: No results found for this or any previous visit (from the past 240 hour(s)).  RADIOLOGY STUDIES/RESULTS: No results found.  Oren Binet, MD  [facility] Pager:[phone]  If 7PM-7AM, please contact night-coverage [email or web address] Password [email or web address] 01/03/2014, 8:53 AM   LOS: 1 day   **Disclaimer: This note may have been dictated with voice recognition software. Similar sounding words can  inadvertently be transcribed and this note may contain transcription errors which may not have been corrected upon publication of note.**

## 2014-01-03 NOTE — Care Management Note (Addendum)
    Page 1 of 1   01/07/2014     5:22:22 PM CARE MANAGEMENT NOTE 01/07/2014  Patient:  EDYSON, GILLS   Account Number:  [id number]  Date Initiated:  01/03/2014  Documentation initiated by:  Tomi Bamberger  Subjective/Objective Assessment:   dx uti, chronic sys chf  admit- lives with spouse.     Action/Plan:   pt eval- rec hhpt   Anticipated DC Date:  01/07/2014   Anticipated DC Plan:  [place]  CM consult      [facility] Choice  HOME HEALTH   Choice offered to / List presented to:  C-3 Spouse        HH arranged  HH-1 RN  [place].   Status of service:  Completed, signed off Medicare Important Message given?  YES (If response is "NO", the following Medicare IM given date fields will be blank) Date Medicare IM given:  01/03/2014 Medicare IM given by:  Tomi Bamberger Date Additional Medicare IM given:  01/06/2014 Additional Medicare IM given by:  Tomi Bamberger  Discharge Disposition:  [place]  Per UR Regulation:  Reviewed for med. necessity/level of care/duration of stay  If discussed at [place] of Stay Meetings, dates discussed:    Comments:  01/06/14 Stephen, BSN [phone] NCM spoke with patient's spouse , she chose [facility] for [facility] for IV abx, and hhpt.  Referral made to [facility] , [place] notified. Soc will begin 24-48 hrs post dc.

## 2014-01-04 LAB — BASIC METABOLIC PANEL
Anion gap: 10 (ref 5–15)
BUN: 26 mg/dL — ABNORMAL HIGH (ref 6–23)
CO2: 25 mEq/L (ref 19–32)
Calcium: 8.3 mg/dL — ABNORMAL LOW (ref 8.4–10.5)
Chloride: 104 mEq/L (ref 96–112)
Creatinine, Ser: 1.18 mg/dL (ref 0.50–1.35)
GFR calc Af Amer: 63 mL/min — ABNORMAL LOW (ref >90)
GFR calc non Af Amer: 55 mL/min — ABNORMAL LOW (ref >90)
Glucose, Bld: 112 mg/dL — ABNORMAL HIGH (ref 70–99)
Potassium: 3.8 mEq/L (ref 3.7–5.3)
Sodium: 139 mEq/L (ref 137–147)

## 2014-01-04 LAB — CBC
HCT: 37.9 % — ABNORMAL LOW (ref 39.0–52.0)
Hemoglobin: 12.6 g/dL — ABNORMAL LOW (ref 13.0–17.0)
MCH: 29.6 pg (ref 26.0–34.0)
MCHC: 33.2 g/dL (ref 30.0–36.0)
MCV: 89 fL (ref 78.0–100.0)
Platelets: 95 10*3/uL — ABNORMAL LOW (ref 150–400)
RBC: 4.26 MIL/uL (ref 4.22–5.81)
RDW: 13.3 % (ref 11.5–15.5)
WBC: 9 10*3/uL (ref 4.0–10.5)

## 2014-01-04 LAB — URINE MICROSCOPIC-ADD ON

## 2014-01-04 LAB — URINALYSIS, ROUTINE W REFLEX MICROSCOPIC
Bilirubin Urine: NEGATIVE
Glucose, UA: NEGATIVE mg/dL
Ketones, ur: NEGATIVE mg/dL
Nitrite: NEGATIVE
Protein, ur: NEGATIVE mg/dL
Specific Gravity, Urine: 1.012 (ref 1.005–1.030)
Urobilinogen, UA: 0.2 mg/dL (ref 0.0–1.0)
pH: 6 (ref 5.0–8.0)

## 2014-01-04 MED ORDER — URELLE 81 MG PO TABS
1.0000 | ORAL_TABLET | Freq: Four times a day (QID) | ORAL | Status: DC | PRN
  Administered 2014-01-05 – 2014-01-07 (×4): 81 mg via ORAL
  Filled 2014-01-04 (×5): qty 1

## 2014-01-04 MED ORDER — VANCOMYCIN HCL IN DEXTROSE 750-5 MG/150ML-% IV SOLN
750.0000 mg | Freq: Two times a day (BID) | INTRAVENOUS | Status: DC
  Administered 2014-01-04: 750 mg via INTRAVENOUS
  Filled 2014-01-04 (×2): qty 150

## 2014-01-04 NOTE — Progress Notes (Signed)
ANTIBIOTIC CONSULT NOTE - INITIAL  Pharmacy Consult for vancomycin Indication: bacteremia  Allergies  Allergen Reactions  . Pantoprazole Other (See Comments)    Headache and lightheaded  . Ciprofloxacin Swelling and Hives    Allergy classified as swelling here, but pt says Cipro just doesn't work for him    . Nitrofurantoin Hives  . Sulfa Antibiotics Hives    Patient Measurements: Height: 5\' 5"  (165.1 cm) Weight: 154 lb 15.7 oz (70.3 kg) IBW/kg (Calculated) : 61.5  Vital Signs: Temp: 98.2 F (36.8 C) (10/09 2206) Temp Source: Oral (10/09 2206) BP: 102/50 mmHg (10/09 2206) Pulse Rate: 71 (10/09 2206) Intake/Output from previous day: 10/09 0701 - 10/10 0700 In: 700 [P.O.:700] Out: 800 [Urine:800] Intake/Output from this shift: Total I/O In: 120 [P.O.:120] Out: 600 [Urine:600]  Labs:  Recent Labs  01/01/14 2315 01/03/14 0537  WBC 10.8* 13.4*  HGB 13.9 14.3  PLT 151 98*  CREATININE 0.95 1.10   Estimated Creatinine Clearance: 43.5 ml/min (by C-G formula based on Cr of 1.1).   Microbiology: Recent Results (from the past 720 hour(s))  URINE CULTURE     Status: None   Collection Time    01/02/14  6:04 AM      Result Value Ref Range Status   Specimen Description URINE, CLEAN CATCH   Final   Special Requests NONE   Final   Culture  Setup Time     Final   Value: 2020/07/913 08:53     Performed at [place]     Final   Value: 80,000 COLONIES/ML     Performed at [facility]   Culture     Final   Value: Multiple bacterial morphotypes present, none predominant. Suggest appropriate recollection if clinically indicated.     Performed at [facility]   Report Status 01/03/2014 FINAL   Final  CULTURE, BLOOD (ROUTINE X 2)     Status: None   Collection Time    01/02/14  8:35 AM      Result Value Ref Range Status   Specimen Description BLOOD RIGHT ANTECUBITAL   Final   Special Requests BOTTLES DRAWN AEROBIC AND ANAEROBIC  10MLS   Final   Culture  Setup Time     Final   Value: 2020/07/913 12:56     Performed at [facility]   Culture     Final   Value:        BLOOD CULTURE RECEIVED NO GROWTH TO DATE CULTURE WILL BE HELD FOR 5 DAYS BEFORE ISSUING A FINAL NEGATIVE REPORT     Performed at [facility]   Report Status PENDING   Incomplete  CULTURE, BLOOD (ROUTINE X 2)     Status: None   Collection Time    01/02/14  8:45 AM      Result Value Ref Range Status   Specimen Description BLOOD HAND RIGHT   Final   Special Requests BOTTLES DRAWN AEROBIC AND ANAEROBIC 10CC   Final   Culture  Setup Time     Final   Value: 2020/07/913 12:56     Performed at [facility]   Culture     Final   Value: GRAM POSITIVE RODS     Note: Gram Stain Report Called to,Read Back By and Verified With: APRIL THOMPSON 01/04/14 AT 0335 RIDK     Performed at [facility]   Report Status PENDING   Incomplete    Medical History: Past  Medical History  Diagnosis Date  . Hyperlipidemia   . S/P CABG (coronary artery bypass graft)     [place]  . History of Bell's palsy     RIGHT SIDE-- NO RESIDUAL  . Gross hematuria   . Urethral stricture   . OAB (overactive bladder)   . Chronic cystitis   . Ischemic cardiomyopathy     ef 35-40% per cath 08-28-2013  . Coronary artery disease CARDIOLOGIST-  DR Grace Bushy  MI -- S/P  11/89 CABG x6 (70% circ; 90% PD; 60-70% distal left main; 30% left circ; 90% 1st diag;, 2nd diag and 3rd diag. w/90%,  mild stenosis LAD and 60-70% pLAD)  Re-do CABG x5 in 1997  . History of nonmelanoma skin cancer     excision's from scalp  . Wears hearing aid     bilateral  . Wears partial dentures   . At risk for sleep apnea     STOP-BANG= 4    SENT TO PCP 12-23-2013  . GERD (gastroesophageal reflux disease)   . Mild dementia   . Kidney stones "years ago"    "passed them"  . Hypertension     "not anymore" (01/02/2014)  . Myocardial infarction 1986; 1997  .  Arthritis     "joints ache" (01/02/2014)  . Prostate cancer 1998    S/P  RADIACTIVE SEED IMPLANTS; UROLOGIST-  DR GRAPEY  . Melanoma of ear     "right"    Medications:  Prescriptions prior to admission  Medication Sig Dispense Refill  . acetaminophen (TYLENOL) 500 MG tablet Take 500 mg by mouth every 6 (six) hours as needed for pain or fever. Take 1 -2 pills      . aspirin EC 81 MG EC tablet Take 1 tablet (81 mg total) by mouth daily.  30 tablet  0  . atorvastatin (LIPITOR) 80 MG tablet Take 40 mg by mouth every evening.      . Calcium Carbonate-Vitamin D (CALCIUM + D PO) Take 1 tablet by mouth 2 (two) times daily.       . Cholecalciferol (VITAMIN D) 2000 UNITS tablet Take 2,000 Units by mouth daily.      Marland Kitchen galantamine (RAZADYNE ER) 8 MG 24 hr capsule Take 8 mg by mouth daily with breakfast.      . HYDROcodone-acetaminophen (NORCO/VICODIN) 5-325 MG per tablet Take 1-2 tablets by mouth every 6 (six) hours as needed.  20 tablet  0  . hydroxypropyl methylcellulose (ISOPTO TEARS) 2.5 % ophthalmic solution Place 1 drop into both eyes 2 (two) times daily.       Marland Kitchen ibuprofen (ADVIL,MOTRIN) 200 MG tablet Take 200 mg by mouth every 6 (six) hours as needed.      . isosorbide mononitrate (IMDUR) 60 MG 24 hr tablet Take 60 mg by mouth every morning.      . Multiple Vitamin (MULTIVITAMIN WITH MINERALS) TABS Take 1 tablet by mouth daily.      . ranitidine (ZANTAC) 150 MG tablet Take 150 mg by mouth as needed for heartburn.      . silodosin (RAPAFLO) 8 MG CAPS capsule Take 8 mg by mouth every evening.       . nitroGLYCERIN (NITROSTAT) 0.4 MG SL tablet Place 0.4 mg under the tongue every 5 (five) minutes as needed for chest pain.        Scheduled:  . aspirin EC  81 mg Oral Daily  . atorvastatin  40  mg Oral QPM  . cefTRIAXone (ROCEPHIN)  IV  1 g Intravenous Q24H  . cholecalciferol  2,000 Units Oral Daily  . enoxaparin (LOVENOX) injection  40 mg Subcutaneous Q24H  . famotidine  20 mg Oral BID  .  galantamine  8 mg Oral Q breakfast  . isosorbide mononitrate  30 mg Oral q morning - 10a  . multivitamin with minerals  1 tablet Oral Daily  . polyvinyl alcohol  1 drop Both Eyes BID  . tamsulosin  0.4 mg Oral Daily    Assessment: 80yo male admitted 10/8 for UTI/SIRS, started on Rocephin, now w/ GPR on blood Cx, to broaden IV ABX.  Goal of Therapy:  Vancomycin trough level 15-20 mcg/ml  Plan:  Will begin vancomycin 750mg  IV Q12H and monitor CBC, Cx, levels prn.  Wynona Neat, PharmD, BCPS  01/04/2014,4:24 AM

## 2014-01-04 NOTE — Progress Notes (Addendum)
Small drops of blood and small blood clot noticed from patient's penis. No active bleeding once cleaned. No other complaints at this time. Rogue Bussing, NP notified.  69 - spoke with Rogue Bussing, NP. No new orders given. Will continue to monitor patient.

## 2014-01-04 NOTE — Progress Notes (Addendum)
[facility] lab notified patient anaerobic bottle gram positive rods. Rogue Bussing, NP notified. [id number] Bussing, NP placed orders .

## 2014-01-04 NOTE — Progress Notes (Addendum)
PATIENT DETAILS Name: Ronald [name] Age: 80 y.o. Sex: male Date of Birth: Jan 03, 1928 Admit Date: 01/02/2014 Admitting Physician Evalee Mutton Kristeen Mans, MD [name] [name], MD  Subjective: Feels much better-had painful urination this morning-passed a few clots  Assessment/Plan: Principal Problem:   Urinary tract infection -Recently had cystoscopy and lithotripsy of a bladder stone, following which he required a Foley catheterization. Foley catheter was removed 2 days back.Suspect Catheter related UTI -improved clinically-c/w Rocephin-day 3,Urine culture- showing multiple bacterial morphotypes, will repeat. Blood culture-one set-growing gram-positive rods-suspect contamination patient- will stop vancomycin and repeat blood cultures. Now afebrile overnight,leukocytosishas resolved, significantly improved than on admission.   Active Problems: SIR's -secondary to above -c/w IV Rocephin  Dysuria with ?hematuria -given recent history of cystoscopy with urethral dilatation and lithotripsy of Bladder calculus-have consulted Urology  HTN (hypertension)  - BP-Controlled- continue with Imdur for now-we'll continue to monitor blood pressure readings and adjust accordingly.   HYPERCHOLESTEROLEMIA  - Continue statins.   Coronary atherosclerosis  - Continue with aspirin, statin. Not on beta blockers as an outpatient.   Recent history of cystoscopy with urethral dilatation and lithotripsy of Bladder calculus -will need Urology follow up post discharge  Thrombocytopenia -appears chronic-watch platelet while on prophylactic Lovenox  BPH -c/w Flomax  Hx of Prostate Cancer -outpatient Urology follow up  Chronic Systolic CHF -compensated-EF around 35-40 percent by Cardiac Cath June 2015  Disposition: Remain inpatient  DVT Prophylaxis: Prophylactic Lovenox -watch platelets  Code Status: Full code   Family Communication Spouse at  bedside  Procedures:  None  CONSULTS:  None   MEDICATIONS: Scheduled Meds: . aspirin EC  81 mg Oral Daily  . atorvastatin  40 mg Oral QPM  . cefTRIAXone (ROCEPHIN)  IV  1 g Intravenous Q24H  . cholecalciferol  2,000 Units Oral Daily  . enoxaparin (LOVENOX) injection  40 mg Subcutaneous Q24H  . famotidine  20 mg Oral BID  . galantamine  8 mg Oral Q breakfast  . isosorbide mononitrate  30 mg Oral q morning - 10a  . multivitamin with minerals  1 tablet Oral Daily  . polyvinyl alcohol  1 drop Both Eyes BID  . tamsulosin  0.4 mg Oral Daily  . vancomycin  750 mg Intravenous Q12H   Continuous Infusions:  PRN Meds:.acetaminophen, acetaminophen, albuterol, alum & mag hydroxide-simeth, guaiFENesin-dextromethorphan, HYDROcodone-acetaminophen, morphine injection, nitroGLYCERIN, ondansetron (ZOFRAN) IV, ondansetron, URELLE  Antibiotics: Anti-infectives   Start     Dose/Rate Route Frequency Ordered Stop   01/04/14 0500  vancomycin (VANCOCIN) IVPB 750 mg/150 ml premix     750 mg 150 mL/hr over 60 Minutes Intravenous Every 12 hours 01/04/14 0426     01/03/14 0600  cefTRIAXone (ROCEPHIN) 1 g in dextrose 5 % 50 mL IVPB     1 g 100 mL/hr over 30 Minutes Intravenous Every 24 hours 01/02/14 1658     01/02/14 1800  cefTRIAXone (ROCEPHIN) 1 g in dextrose 5 % 50 mL IVPB  Status:  Discontinued     1 g 100 mL/hr over 30 Minutes Intravenous Every 24 hours 01/02/14 1657 01/02/14 1658   01/02/14 1245  cefTRIAXone (ROCEPHIN) 1 g in dextrose 5 % 50 mL IVPB  Status:  Discontinued     1 g 100 mL/hr over 30 Minutes Intravenous Every 24 hours 01/02/14 1232 01/02/14 1657   01/02/14 0600  cefTRIAXone (ROCEPHIN) 1 g in dextrose 5 % 50 mL IVPB     1 g 100 mL/hr over 30  Minutes Intravenous  Once 01/02/14 0554 01/02/14 0648       PHYSICAL EXAM: Vital signs in last 24 hours: Filed Vitals:   01/03/14 1407 01/03/14 1413 01/03/14 2206 01/04/14 0522  BP: 92/55 98/58 102/50 122/62  Pulse: 77  71 92  Temp:  97.9 F (36.6 C)  98.2 F (36.8 C) 98.2 F (36.8 C)  TempSrc: Oral  Oral Oral  Resp: 18  18 18   Height:      Weight:      SpO2: 95%  97% 97%    Weight change:  Filed Weights   01/01/14 2245 01/02/14 1445  Weight: 71.215 kg (157 lb) 70.3 kg (154 lb 15.7 oz)   Body mass index is 25.79 kg/(m^2).   Gen Exam: Awake and alert with clear speech.   Neck: Supple, No JVD.   Chest: B/L Clear.  No rales CVS: S1 S2 Regular, no murmurs.  Abdomen: soft, BS +, non tender, non distended.  Extremities: no edema, lower extremities warm to touch. Neurologic: Non Focal.   Skin: No Rash.   Wounds: N/A.   Intake/Output from previous day:  Intake/Output Summary (Last 24 hours) at 01/04/14 1203 Last data filed at 01/04/14 [id number]  Gross per 24 hour  Intake    320 ml  Output    600 ml  Net   -280 ml     LAB RESULTS: CBC  Recent Labs Lab 01/01/14 2315 01/03/14 0537 01/04/14 0344  WBC 10.8* 13.4* 9.0  HGB 13.9 14.3 12.6*  HCT 41.4 42.5 37.9*  PLT 151 98* 95*  MCV 89.0 89.3 89.0  MCH 29.9 30.0 29.6  MCHC 33.6 33.6 33.2  RDW 13.0 13.1 13.3  LYMPHSABS 0.2*  --   --   MONOABS 1.4*  --   --   EOSABS 0.0  --   --   BASOSABS 0.0  --   --     Chemistries   Recent Labs Lab 01/01/14 2315 01/03/14 0537 01/04/14 0344  NA 135* 137 139  K 5.0 3.7 3.8  CL 100 102 104  CO2 19 24 25   GLUCOSE 131* 122* 112*  BUN 22 18 26*  CREATININE 0.95 1.10 1.18  CALCIUM 8.8 8.3* 8.3*    CBG: No results found for this basename: GLUCAP,  in the last 168 hours  GFR Estimated Creatinine Clearance: 40.5 ml/min (by C-G formula based on Cr of 1.18).  Coagulation profile No results found for this basename: INR, PROTIME,  in the last 168 hours  Cardiac Enzymes No results found for this basename: CK, CKMB, TROPONINI, MYOGLOBIN,  in the last 168 hours  No components found with this basename: POCBNP,  No results found for this basename: DDIMER,  in the last 72 hours No results found for this  basename: HGBA1C,  in the last 72 hours No results found for this basename: CHOL, HDL, LDLCALC, TRIG, CHOLHDL, LDLDIRECT,  in the last 72 hours No results found for this basename: TSH, T4TOTAL, FREET3, T3FREE, THYROIDAB,  in the last 72 hours No results found for this basename: VITAMINB12, FOLATE, FERRITIN, TIBC, IRON, RETICCTPCT,  in the last 72 hours No results found for this basename: LIPASE, AMYLASE,  in the last 72 hours  Urine Studies No results found for this basename: UACOL, UAPR, USPG, UPH, UTP, UGL, UKET, UBIL, UHGB, UNIT, UROB, ULEU, UEPI, UWBC, URBC, UBAC, CAST, CRYS, UCOM, BILUA,  in the last 72 hours  MICROBIOLOGY: Recent Results (from the past 240 hour(s))  URINE CULTURE  Status: None   Collection Time    01/02/14  6:04 AM      Result Value Ref Range Status   Specimen Description URINE, CLEAN CATCH   Final   Special Requests NONE   Final   Culture  Setup Time     Final   Value: 05-10-202015 08:53     Performed at [place]     Final   Value: 80,000 COLONIES/ML     Performed at [facility]   Culture     Final   Value: Multiple bacterial morphotypes present, none predominant. Suggest appropriate recollection if clinically indicated.     Performed at [facility]   Report Status 01/03/2014 FINAL   Final  CULTURE, BLOOD (ROUTINE X 2)     Status: None   Collection Time    01/02/14  8:35 AM      Result Value Ref Range Status   Specimen Description BLOOD RIGHT ANTECUBITAL   Final   Special Requests BOTTLES DRAWN AEROBIC AND ANAEROBIC 10MLS   Final   Culture  Setup Time     Final   Value: 05-10-202015 12:56     Performed at [facility]   Culture     Final   Value:        BLOOD CULTURE RECEIVED NO GROWTH TO DATE CULTURE WILL BE HELD FOR 5 DAYS BEFORE ISSUING A FINAL NEGATIVE REPORT     Performed at [facility]   Report Status PENDING   Incomplete  CULTURE, BLOOD (ROUTINE X 2)     Status: None   Collection Time     01/02/14  8:45 AM      Result Value Ref Range Status   Specimen Description BLOOD HAND RIGHT   Final   Special Requests BOTTLES DRAWN AEROBIC AND ANAEROBIC 10CC   Final   Culture  Setup Time     Final   Value: 05-10-202015 12:56     Performed at [facility]   Culture     Final   Value: GRAM POSITIVE RODS     Note: Gram Stain Report Called to,Read Back By and Verified With: APRIL THOMPSON 01/04/14 AT 0335 RIDK     Performed at [facility]   Report Status PENDING   Incomplete    RADIOLOGY STUDIES/RESULTS: No results found.  Oren Binet, MD  [facility] Pager:[phone]  If 7PM-7AM, please contact night-coverage [email or web address] Password [email or web address] 01/04/2014, 12:03 PM   LOS: 2 days

## 2014-01-04 NOTE — Progress Notes (Signed)
Subjective: Patient reports bladder/penile pain with voiding over the past few hours. He passed a few clots as well. No fever or chills.  Objective: Vital signs in last 24 hours: Temp:  [97.9 F (36.6 C)-98.2 F (36.8 C)] 98.2 F (36.8 C) (10/10 0522) Pulse Rate:  [71-92] 92 (10/10 0522) Resp:  [18] 18 (10/10 0522) BP: (92-122)/(50-62) 122/62 mmHg (10/10 0522) SpO2:  [95 %-97 %] 97 % (10/10 0522)  Intake/Output from previous day: 10/09 0701 - 10/10 0700 In: 900 [P.O.:700; IV Piggyback:200] Out: 800 [Urine:800] Intake/Output this shift:    Physical Exam:  Constitutional: Vital signs reviewed. WD WN in NAD   Eyes: PERRL, No scleral icterus.   Pulmonary/Chest: Normal effort Abdominal: Soft. Non-tender, non-distended, bowel sounds are normal, no masses, organomegaly, or guarding present.  Genitourinary: Condom catheter in place. Foreskin is anatomically positioned. Genital skin normal. Extremities: No cyanosis or edema  Urine in catheter bag is clear/amber, without clots currently Lab Results:  Recent Labs  01/01/14 2315 01/03/14 0537 01/04/14 0344  HGB 13.9 14.3 12.6*  HCT 41.4 42.5 37.9*   BMET  Recent Labs  01/03/14 0537 01/04/14 0344  NA 137 139  K 3.7 3.8  CL 102 104  CO2 24 25  GLUCOSE 122* 112*  BUN 18 26*  CREATININE 1.10 1.18  CALCIUM 8.3* 8.3*   No results found for this basename: LABPT, INR,  in the last 72 hours No results found for this basename: LABURIN,  in the last 72 hours Results for orders placed during the hospital encounter of 01/02/14  URINE CULTURE     Status: None   Collection Time    01/02/14  6:04 AM      Result Value Ref Range Status   Specimen Description URINE, CLEAN CATCH   Final   Special Requests NONE   Final   Culture  Setup Time     Final   Value: 08/07/202015 08:53     Performed at [place]     Final   Value: 80,000 COLONIES/ML     Performed at [facility]   Culture     Final   Value: Multiple bacterial morphotypes present, none predominant. Suggest appropriate recollection if clinically indicated.     Performed at [facility]   Report Status 01/03/2014 FINAL   Final  CULTURE, BLOOD (ROUTINE X 2)     Status: None   Collection Time    01/02/14  8:35 AM      Result Value Ref Range Status   Specimen Description BLOOD RIGHT ANTECUBITAL   Final   Special Requests BOTTLES DRAWN AEROBIC AND ANAEROBIC 10MLS   Final   Culture  Setup Time     Final   Value: 08/07/202015 12:56     Performed at [facility]   Culture     Final   Value:        BLOOD CULTURE RECEIVED NO GROWTH TO DATE CULTURE WILL BE HELD FOR 5 DAYS BEFORE ISSUING A FINAL NEGATIVE REPORT     Performed at [facility]   Report Status PENDING   Incomplete  CULTURE, BLOOD (ROUTINE X 2)     Status: None   Collection Time    01/02/14  8:45 AM      Result Value Ref Range Status   Specimen Description BLOOD HAND RIGHT   Final   Special Requests BOTTLES DRAWN AEROBIC AND ANAEROBIC 10CC   Final   Culture  Setup Time     Final   Value: Mar 10, 202015 12:56     Performed at [facility]   Culture     Final   Value: GRAM POSITIVE RODS     Note: Gram Stain Report Called to,Read Back By and Verified With: APRIL THOMPSON 01/04/14 AT 0335 RIDK     Performed at [facility]   Report Status PENDING   Incomplete    Studies/Results: No results found.  Assessment/Plan:   Urinary urgency, dysuria/bladder pain. Recent urine culture was negative. I think it's worthwhile to send another urinalysis. Also, will check bladder scan. I think it's fine to treat with an antispasmodic if his bladder scan residual volume is low.   LOS: 2 days   Franchot Gallo M 01/04/2014, 11:54 AM

## 2014-01-05 DIAGNOSIS — I5022 Chronic systolic (congestive) heart failure: Secondary | ICD-10-CM

## 2014-01-05 DIAGNOSIS — I2510 Atherosclerotic heart disease of native coronary artery without angina pectoris: Secondary | ICD-10-CM

## 2014-01-05 DIAGNOSIS — R6510 Systemic inflammatory response syndrome (SIRS) of non-infectious origin without acute organ dysfunction: Secondary | ICD-10-CM

## 2014-01-05 DIAGNOSIS — E878 Other disorders of electrolyte and fluid balance, not elsewhere classified: Secondary | ICD-10-CM

## 2014-01-05 DIAGNOSIS — N390 Urinary tract infection, site not specified: Secondary | ICD-10-CM

## 2014-01-05 DIAGNOSIS — R7881 Bacteremia: Secondary | ICD-10-CM

## 2014-01-05 DIAGNOSIS — B952 Enterococcus as the cause of diseases classified elsewhere: Secondary | ICD-10-CM

## 2014-01-05 DIAGNOSIS — N210 Calculus in bladder: Secondary | ICD-10-CM

## 2014-01-05 DIAGNOSIS — I10 Essential (primary) hypertension: Secondary | ICD-10-CM

## 2014-01-05 MED ORDER — VANCOMYCIN HCL IN DEXTROSE 1-5 GM/200ML-% IV SOLN
1000.0000 mg | INTRAVENOUS | Status: DC
  Administered 2014-01-05 – 2014-01-06 (×2): 1000 mg via INTRAVENOUS
  Filled 2014-01-05 (×2): qty 200

## 2014-01-05 MED ORDER — AMPICILLIN SODIUM 2 G IJ SOLR
2.0000 g | Freq: Four times a day (QID) | INTRAMUSCULAR | Status: DC
  Administered 2014-01-05 – 2014-01-07 (×9): 2 g via INTRAVENOUS
  Filled 2014-01-05 (×12): qty 2000

## 2014-01-05 NOTE — Progress Notes (Signed)
ANTIBIOTIC CONSULT NOTE - INITIAL  Pharmacy Consult for vancomycin Indication: bacteremia  Allergies  Allergen Reactions  . Pantoprazole Other (See Comments)    Headache and lightheaded  . Ciprofloxacin Swelling and Hives    Allergy classified as swelling here, but pt says Cipro just doesn't work for him    . Nitrofurantoin Hives  . Sulfa Antibiotics Hives    Patient Measurements: Height: 5\' 5"  (165.1 cm) Weight: 154 lb 15.7 oz (70.3 kg) IBW/kg (Calculated) : 61.5  Vital Signs: Temp: 98.8 F (37.1 C) (10/11 0556) Temp Source: Oral (10/11 0556) BP: 120/67 mmHg (10/11 0556) Pulse Rate: 83 (10/11 0556) Intake/Output from previous day: 10/10 0701 - 10/11 0700 In: 290 [P.O.:240; IV Piggyback:50] Out: 2250 [Urine:2250] Intake/Output from this shift: Total I/O In: -  Out: 700 [Urine:700]  Labs:  Recent Labs  01/03/14 0537 01/04/14 0344  WBC 13.4* 9.0  HGB 14.3 12.6*  PLT 98* 95*  CREATININE 1.10 1.18   Estimated Creatinine Clearance: 40.5 ml/min (by C-G formula based on Cr of 1.18).   Microbiology: Recent Results (from the past 720 hour(s))  URINE CULTURE     Status: None   Collection Time    01/02/14  6:04 AM      Result Value Ref Range Status   Specimen Description URINE, CLEAN CATCH   Final   Special Requests NONE   Final   Culture  Setup Time     Final   Value: 12/17/202015 08:53     Performed at [place]     Final   Value: 80,000 COLONIES/ML     Performed at [facility]   Culture     Final   Value: Multiple bacterial morphotypes present, none predominant. Suggest appropriate recollection if clinically indicated.     Performed at [facility]   Report Status 01/03/2014 FINAL   Final  CULTURE, BLOOD (ROUTINE X 2)     Status: None   Collection Time    01/02/14  8:35 AM      Result Value Ref Range Status   Specimen Description BLOOD RIGHT ANTECUBITAL   Final   Special Requests BOTTLES DRAWN AEROBIC AND  ANAEROBIC 10MLS   Final   Culture  Setup Time     Final   Value: 12/17/202015 12:56     Performed at [facility]   Culture     Final   Value:        BLOOD CULTURE RECEIVED NO GROWTH TO DATE CULTURE WILL BE HELD FOR 5 DAYS BEFORE ISSUING A FINAL NEGATIVE REPORT     Performed at [facility]   Report Status PENDING   Incomplete  CULTURE, BLOOD (ROUTINE X 2)     Status: None   Collection Time    01/02/14  8:45 AM      Result Value Ref Range Status   Specimen Description BLOOD HAND RIGHT   Final   Special Requests BOTTLES DRAWN AEROBIC AND ANAEROBIC 10CC   Final   Culture  Setup Time     Final   Value: 12/17/202015 12:56     Performed at [facility]   Culture     Final   Value: ENTEROCOCCUS SPECIES     Note: Gram Stain Report Called to,Read Back By and Verified With: APRIL THOMPSON 01/04/14 AT 0335 RIDK PREVIOSLY REPORTED AS GRAM POSITIVE RODS CORRECTED RESULTS CALLED TO: ROBIN ZELLNER 01/05/14 @ 12:22PM BY RUSCOE A.  Performed at [facility]   Report Status PENDING   Incomplete    Medical History: Past Medical History  Diagnosis Date  . Hyperlipidemia   . S/P CABG (coronary artery bypass graft)     [place]  . History of Bell's palsy     RIGHT SIDE-- NO RESIDUAL  . Gross hematuria   . Urethral stricture   . OAB (overactive bladder)   . Chronic cystitis   . Ischemic cardiomyopathy     ef 35-40% per cath 08-28-2013  . Coronary artery disease CARDIOLOGIST-  DR Grace Bushy  [place] -- S/P  11/89 CABG x6 (70% circ; 90% PD; 60-70% distal left main; 30% left circ; 90% 1st diag;, 2nd diag and 3rd diag. w/90%,  mild stenosis LAD and 60-70% pLAD)  Re-do CABG x5 in 1997  . History of nonmelanoma skin cancer     excision's from scalp  . Wears hearing aid     bilateral  . Wears partial dentures   . At risk for sleep apnea     STOP-BANG= 4    SENT TO PCP 12-23-2013  . GERD (gastroesophageal reflux disease)   . Mild dementia   . Kidney stones  "years ago"    "passed them"  . Hypertension     "not anymore" (01/02/2014)  . Myocardial infarction 1986; 1997  . Arthritis     "joints ache" (01/02/2014)  . Prostate cancer 1998    S/P  RADIACTIVE SEED IMPLANTS; UROLOGIST-  DR GRAPEY  . Melanoma of ear     "right"    Medications:  Prescriptions prior to admission  Medication Sig Dispense Refill  . acetaminophen (TYLENOL) 500 MG tablet Take 500 mg by mouth every 6 (six) hours as needed for pain or fever. Take 1 -2 pills      . aspirin EC 81 MG EC tablet Take 1 tablet (81 mg total) by mouth daily.  30 tablet  0  . atorvastatin (LIPITOR) 80 MG tablet Take 40 mg by mouth every evening.      . Calcium Carbonate-Vitamin D (CALCIUM + D PO) Take 1 tablet by mouth 2 (two) times daily.       . Cholecalciferol (VITAMIN D) 2000 UNITS tablet Take 2,000 Units by mouth daily.      Marland Kitchen galantamine (RAZADYNE ER) 8 MG 24 hr capsule Take 8 mg by mouth daily with breakfast.      . HYDROcodone-acetaminophen (NORCO/VICODIN) 5-325 MG per tablet Take 1-2 tablets by mouth every 6 (six) hours as needed.  20 tablet  0  . hydroxypropyl methylcellulose (ISOPTO TEARS) 2.5 % ophthalmic solution Place 1 drop into both eyes 2 (two) times daily.       Marland Kitchen ibuprofen (ADVIL,MOTRIN) 200 MG tablet Take 200 mg by mouth every 6 (six) hours as needed.      . isosorbide mononitrate (IMDUR) 60 MG 24 hr tablet Take 60 mg by mouth every morning.      . Multiple Vitamin (MULTIVITAMIN WITH MINERALS) TABS Take 1 tablet by mouth daily.      . ranitidine (ZANTAC) 150 MG tablet Take 150 mg by mouth as needed for heartburn.      . silodosin (RAPAFLO) 8 MG CAPS capsule Take 8 mg by mouth every evening.       . nitroGLYCERIN (NITROSTAT) 0.4 MG SL tablet Place 0.4 mg under the tongue every 5 (five) minutes as needed for chest pain.  Scheduled:  . ampicillin (OMNIPEN) IV  2 g Intravenous 4 times per day  . aspirin EC  81 mg Oral Daily  . atorvastatin  40 mg Oral QPM  . cholecalciferol   2,000 Units Oral Daily  . enoxaparin (LOVENOX) injection  40 mg Subcutaneous Q24H  . famotidine  20 mg Oral BID  . galantamine  8 mg Oral Q breakfast  . isosorbide mononitrate  30 mg Oral q morning - 10a  . multivitamin with minerals  1 tablet Oral Daily  . polyvinyl alcohol  1 drop Both Eyes BID  . tamsulosin  0.4 mg Oral Daily  . vancomycin  1,000 mg Intravenous Q24H    Assessment: 80yo male admitted 10/8 for UTI/SIRS being treated with ampicillin restarting on vancomycin per Dr. Tommy Medal with ID. Vanc was previously d/c'd due to enterococcus growing in 1/2 BC. Pt's foley required for recent procedure removed on 10/6. Afeb, wbc wnl. SCr 1.18, CrCl~40, UOP good  10/8 Rocephin >>10/11 10/11 Ampicillin >>  10/11 Vanc >>  10/8 BCx2>>Enterococcus 1/2 - contaminant? Keep for now per [facility] 10/8 UC >>ngf  Goal of Therapy:  Vancomycin trough level 15-20 mcg/ml  Plan:  Ampicillin 2g IV q6h Vanc 1000mg  IV q24h F/u clinical progress, c/s, renal funct, abx dot, VT@SS  if indicated  Elicia Lamp, PharmD Clinical Pharmacist - Resident Pager [phone] 01/05/2014 2:25 PM

## 2014-01-05 NOTE — Consult Note (Signed)
[name]    Date of Admission:  01/02/2014  Date of Consult:  01/05/2014  Reason for Consult: Enterococcal bacteremia Referring Physician: auto-consult, Dr. Sloan Leiter   HPI: Ronald [name] is an 80 y.o. male. male with a Past Medical History of coronary disease status post CABG, history of prostate cancer, recent cystoscopy with balloon dilatation of the urethral stricture and lithotripsy of large bladder calculus following which he required a Foley catheter that was removed. Not long after removal of the catheter he developed dysuria, fever and became unable to get of bed, similar to how he was last Feb 2014 when he was admitted with E coli bacteremia. Pt was brought via EMS to ED  He had urine and blood cultures taken and was treated with ceftriaxone for 4 days.  Today one of 2 admission blood cultures is + for Enterococcus  Urine grew 80K mixed morphotypes.    Past Medical History  Diagnosis Date  . Hyperlipidemia   . S/P CABG (coronary artery bypass graft)     [facility]  . History of Bell's palsy     RIGHT SIDE-- NO RESIDUAL  . Gross hematuria   . Urethral stricture   . OAB (overactive bladder)   . Chronic cystitis   . Ischemic cardiomyopathy     ef 35-40% per cath 08-28-2013  . Coronary artery disease CARDIOLOGIST-  DR Grace Bushy  [place] -- S/P  11/89 CABG x6 (70% circ; 90% PD; 60-70% distal left main; 30% left circ; 90% 1st diag;, 2nd diag and 3rd diag. w/90%,  mild stenosis LAD and 60-70% pLAD)  Re-do CABG x5 in 1997  . History of nonmelanoma skin cancer     excision's from scalp  . Wears hearing aid     bilateral  . Wears partial dentures   . At risk for sleep apnea     STOP-BANG= 4    SENT TO PCP 12-23-2013  . GERD (gastroesophageal reflux disease)   . Mild dementia   . Kidney stones "years ago"    "passed them"  . Hypertension     "not anymore" (01/02/2014)  . Myocardial infarction 1986; 1997  . Arthritis    "joints ache" (01/02/2014)  . Prostate cancer 1998    S/P  RADIACTIVE SEED IMPLANTS; UROLOGIST-  DR GRAPEY  . Melanoma of ear     "right"    Past Surgical History  Procedure Laterality Date  . Eus  10/05/2011    Procedure: ESOPHAGEAL ENDOSCOPIC ULTRASOUND (EUS) RADIAL;  Surgeon: Arta Silence, MD;  Location: [facility];  Service: Endoscopy;  Laterality: N/A;  . Tonsillectomy and adenoidectomy  1954  . Laparoscopic cholecystectomy  12-23-2005  . Radioactive prostate seed implants  1998  . Coronary artery bypass graft  11/ 1989  &  10/ 1997    1989-- 6 vessel/  1997 Re-do 5 vessel  . Cardiovascular stress test  08-01-2011  dr Angelena Form    inferior scar and possible soft tissue attenuation with minimal peri-infarct ischemia, small region of anterior ischemia and scar/  LVEF 53% LV wall motion with inferior hypokinesis/ no significant change from scan july 2011  . Cataract extraction w/ intraocular lens  implant, bilateral Bilateral 2007  . Cystoscopy with urethral dilatation N/A 12/25/2013    Procedure: CYSTOSCOPY WITH URETHRAL DILATATION, WITH BIOPSY;  Surgeon: Bernestine Amass, MD;  Location: [facility];  Service: Urology;  Laterality: N/A;  .  Melanoma excision  X 1    "ear"  . Cardiac catheterization  02-22-2005  dr Vidal Schwalbe    mild to moderate lv dysfunction with inferobasilar akinesis/  totally occluded SVG to Intermediate Diagonal and SVG to PDA and RCA branches, totally occluded native coronary circulation with diffuse disease pLAD diagonal system with potentially could be ischemic/  patent SVG to OM with collaterals to dRCA and patent LIMA to LAD and diagonal systemss  . Cardiac catheterization  08-28-2013  DR Daneen Schick    widely patent sequential left internal graft to the diagonal/LAD, widely patent SVG to OM with proximal 50% narrowing noted in the graft, total occlusion SVG's to RCA, RI, and the Diagonal/ LV dysfunction with inferobasal aneurysm and mid anterior wall  region of akinesis/  overall EF 35-40%/  Total occlusion of the navtive circulation/  no significant change compared to 2006 cath  . Skin cancer excision  X 2    "top of head"  ergies:   Allergies  Allergen Reactions  . Pantoprazole Other (See Comments)    Headache and lightheaded  . Ciprofloxacin Swelling and Hives    Allergy classified as swelling here, but pt says Cipro just doesn't work for him    . Nitrofurantoin Hives  . Sulfa Antibiotics Hives     Medications: I have reviewed patients current medications as documented in Epic Anti-infectives   Start     Dose/Rate Route Frequency Ordered Stop   01/05/14 1400  ampicillin (OMNIPEN) 2 g in sodium chloride 0.9 % 50 mL IVPB     2 g 150 mL/hr over 20 Minutes Intravenous 4 times per day 01/05/14 1245     01/05/14 1330  vancomycin (VANCOCIN) IVPB 1000 mg/200 mL premix     1,000 mg 200 mL/hr over 60 Minutes Intravenous Every 24 hours 01/05/14 1311     01/04/14 0500  vancomycin (VANCOCIN) IVPB 750 mg/150 ml premix  Status:  Discontinued     750 mg 150 mL/hr over 60 Minutes Intravenous Every 12 hours 01/04/14 0426 01/04/14 1207   01/03/14 0600  cefTRIAXone (ROCEPHIN) 1 g in dextrose 5 % 50 mL IVPB  Status:  Discontinued     1 g 100 mL/hr over 30 Minutes Intravenous Every 24 hours 01/02/14 1658 01/05/14 1245   01/02/14 1800  cefTRIAXone (ROCEPHIN) 1 g in dextrose 5 % 50 mL IVPB  Status:  Discontinued     1 g 100 mL/hr over 30 Minutes Intravenous Every 24 hours 01/02/14 1657 01/02/14 1658   01/02/14 1245  cefTRIAXone (ROCEPHIN) 1 g in dextrose 5 % 50 mL IVPB  Status:  Discontinued     1 g 100 mL/hr over 30 Minutes Intravenous Every 24 hours 01/02/14 1232 01/02/14 1657   01/02/14 0600  cefTRIAXone (ROCEPHIN) 1 g in dextrose 5 % 50 mL IVPB     1 g 100 mL/hr over 30 Minutes Intravenous  Once 01/02/14 0554 01/02/14 1610      Social History:  reports that he quit smoking about 30 years ago. His smoking use included Cigars. He quit  smokeless tobacco use about 4 months ago. His smokeless tobacco use included Chew. He reports that he does not drink alcohol or use illicit drugs.  Family History  Problem Relation Age of Onset  . Heart disease Mother   . Diabetes Father   . Heart disease Brother     As in HPI and primary teams notes otherwise 12 point review of systems is negative  Blood pressure 123/51,  pulse 69, temperature 98.7 F (37.1 C), temperature source Oral, resp. rate 17, height 5' 5"  (1.651 m), weight 154 lb 15.7 oz (70.3 kg), SpO2 97.00%.  General: Alert and awake, oriented x3, not in any acute distress eating lunch  HEENT: anicteric sclera,  EOMI, CVS regular rate, normal r,  no murmur rubs or gallops Chest: clear to auscultation bilaterally, no wheezing, rales or rhonchi Abdomen: soft nontender, nondistended, normal bowel sounds, Skin: no rashes Neuro: nonfocal, strength and sensation intact   Results for orders placed during the hospital encounter of 01/02/14 (from the past 48 hour(s))  CBC     Status: Abnormal   Collection Time    01/04/14  3:44 AM      Result Value Ref Range   WBC 9.0  4.0 - 10.5 K/uL   RBC 4.26  4.22 - 5.81 MIL/uL   Hemoglobin 12.6 (*) 13.0 - 17.0 g/dL   HCT 37.9 (*) 39.0 - 52.0 %   MCV 89.0  78.0 - 100.0 fL   MCH 29.6  26.0 - 34.0 pg   MCHC 33.2  30.0 - 36.0 g/dL   RDW 13.3  11.5 - 15.5 %   Platelets 95 (*) 150 - 400 K/uL   Comment: CONSISTENT WITH PREVIOUS RESULT  BASIC METABOLIC PANEL     Status: Abnormal   Collection Time    01/04/14  3:44 AM      Result Value Ref Range   Sodium 139  137 - 147 mEq/L   Potassium 3.8  3.7 - 5.3 mEq/L   Chloride 104  96 - 112 mEq/L   CO2 25  19 - 32 mEq/L   Glucose, Bld 112 (*) 70 - 99 mg/dL   BUN 26 (*) 6 - 23 mg/dL   Creatinine, Ser 1.18  0.50 - 1.35 mg/dL   Calcium 8.3 (*) 8.4 - 10.5 mg/dL   GFR calc non Af Amer 55 (*) >90 mL/min   GFR calc Af Amer 63 (*) >90 mL/min   Comment: (NOTE)     The eGFR has been calculated using  the CKD EPI equation.     This calculation has not been validated in all clinical situations.     eGFR's persistently <90 mL/min signify possible Chronic Kidney     Disease.   Anion gap 10  5 - 15  CULTURE, BLOOD (ROUTINE X 2)     Status: None   Collection Time    01/04/14  7:45 AM      Result Value Ref Range   Specimen Description BLOOD RIGHT ARM     Special Requests BOTTLES DRAWN AEROBIC ONLY 6CC     Culture  Setup Time       Value: 01/04/2014 19:41     Performed at [facility]   Culture       Value:        BLOOD CULTURE RECEIVED NO GROWTH TO DATE CULTURE WILL BE HELD FOR 5 DAYS BEFORE ISSUING A FINAL NEGATIVE REPORT     Performed at [facility]   Report Status PENDING    CULTURE, BLOOD (ROUTINE X 2)     Status: None   Collection Time    01/04/14  7:55 AM      Result Value Ref Range   Specimen Description BLOOD RIGHT HAND     Special Requests BOTTLES DRAWN AEROBIC ONLY 3CC     Culture  Setup Time       Value: 01/04/2014 19:41  Performed at [facility]   Culture       Value:        BLOOD CULTURE RECEIVED NO GROWTH TO DATE CULTURE WILL BE HELD FOR 5 DAYS BEFORE ISSUING A FINAL NEGATIVE REPORT     Performed at [facility]   Report Status PENDING    URINALYSIS, ROUTINE W REFLEX MICROSCOPIC     Status: Abnormal   Collection Time    01/04/14  2:54 PM      Result Value Ref Range   Color, Urine YELLOW  YELLOW   APPearance CLOUDY (*) CLEAR   Specific Gravity, Urine 1.012  1.005 - 1.030   pH 6.0  5.0 - 8.0   Glucose, UA NEGATIVE  NEGATIVE mg/dL   Hgb urine dipstick LARGE (*) NEGATIVE   Bilirubin Urine NEGATIVE  NEGATIVE   Ketones, ur NEGATIVE  NEGATIVE mg/dL   Protein, ur NEGATIVE  NEGATIVE mg/dL   Urobilinogen, UA 0.2  0.0 - 1.0 mg/dL   Nitrite NEGATIVE  NEGATIVE   Leukocytes, UA MODERATE (*) NEGATIVE  URINE MICROSCOPIC-ADD ON     Status: None   Collection Time    01/04/14  2:54 PM      Result Value Ref Range   Squamous Epithelial /  LPF RARE  RARE   WBC, UA 11-20  <3 WBC/hpf   RBC / HPF 11-20  <3 RBC/hpf   ]  Recent Results (from the past 720 hour(s))  URINE CULTURE     Status: None   Collection Time    01/02/14  6:04 AM      Result Value Ref Range Status   Specimen Description URINE, CLEAN CATCH   Final   Special Requests NONE   Final   Culture  Setup Time     Final   Value: 07/21/202015 08:53     Performed at [facility]     Final   Value: 80,000 COLONIES/ML     Performed at [facility]   Culture     Final   Value: Multiple bacterial morphotypes present, none predominant. Suggest appropriate recollection if clinically indicated.     Performed at [facility]   Report Status 01/03/2014 FINAL   Final  CULTURE, BLOOD (ROUTINE X 2)     Status: None   Collection Time    01/02/14  8:35 AM      Result Value Ref Range Status   Specimen Description BLOOD RIGHT ANTECUBITAL   Final   Special Requests BOTTLES DRAWN AEROBIC AND ANAEROBIC 10MLS   Final   Culture  Setup Time     Final   Value: 07/21/202015 12:56     Performed at [facility]   Culture     Final   Value:        BLOOD CULTURE RECEIVED NO GROWTH TO DATE CULTURE WILL BE HELD FOR 5 DAYS BEFORE ISSUING A FINAL NEGATIVE REPORT     Performed at [facility]   Report Status PENDING   Incomplete  CULTURE, BLOOD (ROUTINE X 2)     Status: None   Collection Time    01/02/14  8:45 AM      Result Value Ref Range Status   Specimen Description BLOOD HAND RIGHT   Final   Special Requests BOTTLES DRAWN AEROBIC AND ANAEROBIC 10CC   Final   Culture  Setup Time     Final   Value: 07/21/202015 12:56  Performed at [facility]     Final   Value: ENTEROCOCCUS SPECIES     Note: Gram Stain Report Called to,Read Back By and Verified With: APRIL THOMPSON 01/04/14 AT 0335 RIDK PREVIOSLY REPORTED AS GRAM POSITIVE RODS CORRECTED RESULTS CALLED TO: ROBIN ZELLNER 01/05/14 @ 12:22PM BY RUSCOE A.      Performed at [facility]   Report Status PENDING   Incomplete  CULTURE, BLOOD (ROUTINE X 2)     Status: None   Collection Time    01/04/14  7:45 AM      Result Value Ref Range Status   Specimen Description BLOOD RIGHT ARM   Final   Special Requests BOTTLES DRAWN AEROBIC ONLY [place]   Final   Culture  Setup Time     Final   Value: 01/04/2014 19:41     Performed at [facility]   Culture     Final   Value:        BLOOD CULTURE RECEIVED NO GROWTH TO DATE CULTURE WILL BE HELD FOR 5 DAYS BEFORE ISSUING A FINAL NEGATIVE REPORT     Performed at [facility]   Report Status PENDING   Incomplete  CULTURE, BLOOD (ROUTINE X 2)     Status: None   Collection Time    01/04/14  7:55 AM      Result Value Ref Range Status   Specimen Description BLOOD RIGHT HAND   Final   Special Requests BOTTLES DRAWN AEROBIC ONLY 3CC   Final   Culture  Setup Time     Final   Value: 01/04/2014 19:41     Performed at [facility]   Culture     Final   Value:        BLOOD CULTURE RECEIVED NO GROWTH TO DATE CULTURE WILL BE HELD FOR 5 DAYS BEFORE ISSUING A FINAL NEGATIVE REPORT     Performed at [facility]   Report Status PENDING   Incomplete     Impression/Recommendation  Principal Problem:   Urinary tract infection Active Problems:   HYPERCHOLESTEROLEMIA   Coronary atherosclerosis   HTN (hypertension)   PROSTATE CANCER, HX OF   Bladder calculus   SIRS (systemic inflammatory response syndrome)   Chronic systolic CHF (congestive heart failure)   Ronald [name] is a 80 y.o. male with  Enterococcal bacteremia  #1 Enterococcal bacteremia:  Only in 1/2 cultures but his ssx and presentation are concerning for true bacteremia  --change to vancomycin and high dose AMPICILLIN for now --cultures have already been repeated --would get TTE, and consider TEE  I did not add gentamicin yet  #2 Screening; will check for hepatitis viruses and HIV despite very low risk  demographic  Dr. [name] taking over service tomorrow am.  01/05/2014, 7:58 PM   Thank you so much for this interesting consult  [facility] [phone] (pager) [phone] (office) 01/05/2014, 7:58 PM  [place] 01/05/2014, 7:58 PM

## 2014-01-05 NOTE — Progress Notes (Signed)
01/05/14 Lab called results of Blood Culture results of Entercoccus.

## 2014-01-05 NOTE — Progress Notes (Signed)
PATIENT DETAILS Name: Ronald [name] Age: 80 y.o. Sex: male Date of Birth: [birth year] Admit Date: 01/02/2014 Admitting Physician Evalee Mutton Kristeen Mans, MD [id number] [name], MD  Subjective: Feels much better-had painful urination last night has well.  Assessment/Plan: Principal Problem:   Urinary tract infection -Recently had cystoscopy and lithotripsy of a bladder stone, following which he required a Foley catheterization. Foley catheter was removed 2 days back.Suspect Catheter related UTI -improved clinically-c/w Rocephin-day 4,Urine culture- showing multiple bacterial morphotypes, will repeat. Blood culture-one set-growing gram-positive rods-suspect contamination patient- have stopped vancomycin and awaiting repeat blood cultures. Now afebrile overnight,leukocytosishas resolved, significantly improved than on admission.   Active Problems: SIR's -secondary to above, resolved -c/w IV Rocephin  Dysuria with ?hematuria -given recent history of cystoscopy with urethral dilatation and lithotripsy of Bladder calculus-appreciate Urology assistance. Started on antispasmodics-repeat UA confirms microscopic hematuria  HTN (hypertension)  - BP-Controlled- continue with Imdur for now-we'll continue to monitor blood pressure readings and adjust accordingly.   HYPERCHOLESTEROLEMIA  - Continue statins.   Coronary atherosclerosis  - Continue with aspirin, statin. Not on beta blockers as an outpatient.   Recent history of cystoscopy with urethral dilatation and lithotripsy of Bladder calculus -will need Urology follow up post discharge  Thrombocytopenia -appears chronic-watch platelet while on prophylactic Lovenox  BPH -c/w Flomax  Hx of Prostate Cancer -outpatient Urology follow up  Chronic Systolic CHF -compensated-EF around 35-40 percent by Cardiac Cath June 2015  Disposition: Remain inpatient  DVT Prophylaxis: Prophylactic Lovenox -watch platelets  Code  Status: Full code   Family Communication Spouse at bedside  Procedures:  None  CONSULTS:  None   MEDICATIONS: Scheduled Meds: . aspirin EC  81 mg Oral Daily  . atorvastatin  40 mg Oral QPM  . cefTRIAXone (ROCEPHIN)  IV  1 g Intravenous Q24H  . cholecalciferol  2,000 Units Oral Daily  . enoxaparin (LOVENOX) injection  40 mg Subcutaneous Q24H  . famotidine  20 mg Oral BID  . galantamine  8 mg Oral Q breakfast  . isosorbide mononitrate  30 mg Oral q morning - 10a  . multivitamin with minerals  1 tablet Oral Daily  . polyvinyl alcohol  1 drop Both Eyes BID  . tamsulosin  0.4 mg Oral Daily   Continuous Infusions:  PRN Meds:.acetaminophen, acetaminophen, albuterol, alum & mag hydroxide-simeth, guaiFENesin-dextromethorphan, HYDROcodone-acetaminophen, morphine injection, nitroGLYCERIN, ondansetron (ZOFRAN) IV, ondansetron, URELLE  Antibiotics: Anti-infectives   Start     Dose/Rate Route Frequency Ordered Stop   01/04/14 0500  vancomycin (VANCOCIN) IVPB 750 mg/150 ml premix  Status:  Discontinued     750 mg 150 mL/hr over 60 Minutes Intravenous Every 12 hours 01/04/14 0426 01/04/14 1207   01/03/14 0600  cefTRIAXone (ROCEPHIN) 1 g in dextrose 5 % 50 mL IVPB     1 g 100 mL/hr over 30 Minutes Intravenous Every 24 hours 01/02/14 1658     01/02/14 1800  cefTRIAXone (ROCEPHIN) 1 g in dextrose 5 % 50 mL IVPB  Status:  Discontinued     1 g 100 mL/hr over 30 Minutes Intravenous Every 24 hours 01/02/14 1657 01/02/14 1658   01/02/14 1245  cefTRIAXone (ROCEPHIN) 1 g in dextrose 5 % 50 mL IVPB  Status:  Discontinued     1 g 100 mL/hr over 30 Minutes Intravenous Every 24 hours 01/02/14 1232 01/02/14 1657   01/02/14 0600  cefTRIAXone (ROCEPHIN) 1 g in dextrose 5 % 50 mL IVPB     1 g  100 mL/hr over 30 Minutes Intravenous  Once 01/02/14 0554 01/02/14 0648       PHYSICAL EXAM: Vital signs in last 24 hours: Filed Vitals:   01/04/14 0522 01/04/14 1406 01/04/14 2138 01/05/14 0556  BP:  122/62 116/48 112/58 120/67  Pulse: 92 52 56 83  Temp: 98.2 F (36.8 C) 97.7 F (36.5 C) 98.7 F (37.1 C) 98.8 F (37.1 C)  TempSrc: Oral Oral Oral Oral  Resp: 18 16 17 17   Height:      Weight:      SpO2: 97% 96% 94% 96%    Weight change:  Filed Weights   01/01/14 2245 01/02/14 1445  Weight: 71.215 kg (157 lb) 70.3 kg (154 lb 15.7 oz)   Body mass index is 25.79 kg/(m^2).   Gen Exam: Awake and alert with clear speech.   Neck: Supple, No JVD.   Chest: B/L Clear.  No rales or rhonchi CVS: S1 S2 Regular, no murmurs.  Abdomen: soft, BS +, non tender, non distended.  Extremities: no edema, lower extremities warm to touch. Neurologic: Non Focal.   Skin: No Rash.   Wounds: N/A.   Intake/Output from previous day:  Intake/Output Summary (Last 24 hours) at 01/05/14 1142 Last data filed at 01/05/14 0952  Gross per 24 hour  Intake    290 ml  Output   2950 ml  Net  -2660 ml     LAB RESULTS: CBC  Recent Labs Lab 01/01/14 2315 01/03/14 0537 01/04/14 0344  WBC 10.8* 13.4* 9.0  HGB 13.9 14.3 12.6*  HCT 41.4 42.5 37.9*  PLT 151 98* 95*  MCV 89.0 89.3 89.0  MCH 29.9 30.0 29.6  MCHC 33.6 33.6 33.2  RDW 13.0 13.1 13.3  LYMPHSABS 0.2*  --   --   MONOABS 1.4*  --   --   EOSABS 0.0  --   --   BASOSABS 0.0  --   --     Chemistries   Recent Labs Lab 01/01/14 2315 01/03/14 0537 01/04/14 0344  NA 135* 137 139  K 5.0 3.7 3.8  CL 100 102 104  CO2 19 24 25   GLUCOSE 131* 122* 112*  BUN 22 18 26*  CREATININE 0.95 1.10 1.18  CALCIUM 8.8 8.3* 8.3*    CBG: No results found for this basename: GLUCAP,  in the last 168 hours  GFR Estimated Creatinine Clearance: 40.5 ml/min (by C-G formula based on Cr of 1.18).  Coagulation profile No results found for this basename: INR, PROTIME,  in the last 168 hours  Cardiac Enzymes No results found for this basename: CK, CKMB, TROPONINI, MYOGLOBIN,  in the last 168 hours  No components found with this basename: POCBNP,  No  results found for this basename: DDIMER,  in the last 72 hours No results found for this basename: HGBA1C,  in the last 72 hours No results found for this basename: CHOL, HDL, LDLCALC, TRIG, CHOLHDL, LDLDIRECT,  in the last 72 hours No results found for this basename: TSH, T4TOTAL, FREET3, T3FREE, THYROIDAB,  in the last 72 hours No results found for this basename: VITAMINB12, FOLATE, FERRITIN, TIBC, IRON, RETICCTPCT,  in the last 72 hours No results found for this basename: LIPASE, AMYLASE,  in the last 72 hours  Urine Studies No results found for this basename: UACOL, UAPR, USPG, UPH, UTP, UGL, UKET, UBIL, UHGB, UNIT, UROB, ULEU, UEPI, UWBC, URBC, UBAC, CAST, CRYS, UCOM, BILUA,  in the last 72 hours  MICROBIOLOGY: Recent Results (from the past  240 hour(s))  URINE CULTURE     Status: None   Collection Time    01/02/14  6:04 AM      Result Value Ref Range Status   Specimen Description URINE, CLEAN CATCH   Final   Special Requests NONE   Final   Culture  Setup Time     Final   Value: 02-22-2014 08:53     Performed at [place]     Final   Value: 80,000 COLONIES/ML     Performed at [facility]   Culture     Final   Value: Multiple bacterial morphotypes present, none predominant. Suggest appropriate recollection if clinically indicated.     Performed at [facility]   Report Status 01/03/2014 FINAL   Final  CULTURE, BLOOD (ROUTINE X 2)     Status: None   Collection Time    01/02/14  8:35 AM      Result Value Ref Range Status   Specimen Description BLOOD RIGHT ANTECUBITAL   Final   Special Requests BOTTLES DRAWN AEROBIC AND ANAEROBIC 10MLS   Final   Culture  Setup Time     Final   Value: 02-22-2014 12:56     Performed at [facility]   Culture     Final   Value:        BLOOD CULTURE RECEIVED NO GROWTH TO DATE CULTURE WILL BE HELD FOR 5 DAYS BEFORE ISSUING A FINAL NEGATIVE REPORT     Performed at [facility]   Report  Status PENDING   Incomplete  CULTURE, BLOOD (ROUTINE X 2)     Status: None   Collection Time    01/02/14  8:45 AM      Result Value Ref Range Status   Specimen Description BLOOD HAND RIGHT   Final   Special Requests BOTTLES DRAWN AEROBIC AND ANAEROBIC 10CC   Final   Culture  Setup Time     Final   Value: 02-22-2014 12:56     Performed at [facility]   Culture     Final   Value: GRAM POSITIVE RODS     Note: Gram Stain Report Called to,Read Back By and Verified With: APRIL THOMPSON 01/04/14 AT 0335 RIDK     Performed at [facility]   Report Status PENDING   Incomplete    RADIOLOGY STUDIES/RESULTS: No results found.  Oren Binet, MD  [facility] Pager:[phone]  If 7PM-7AM, please contact night-coverage [email or web address] Password [email or web address] 01/05/2014, 11:42 AM   LOS: 3 days

## 2014-01-06 DIAGNOSIS — I517 Cardiomegaly: Secondary | ICD-10-CM

## 2014-01-06 LAB — HEPATITIS PANEL, ACUTE
HCV Ab: NEGATIVE
Hep A IgM: NONREACTIVE
Hep B C IgM: NONREACTIVE
Hepatitis B Surface Ag: NEGATIVE

## 2014-01-06 LAB — URINE CULTURE
Colony Count: NO GROWTH
Culture: NO GROWTH

## 2014-01-06 LAB — CULTURE, BLOOD (ROUTINE X 2)

## 2014-01-06 NOTE — Progress Notes (Signed)
  Echocardiogram 2D Echocardiogram has been performed.  Diamond Nickel 01/06/2014, 9:17 AM

## 2014-01-06 NOTE — Progress Notes (Signed)
Physical Therapy Treatment Patient Details Name: Ronald [name] MRN: [id number] DOB: Aug 24, 1927 Today's Date: 01/06/2014    History of Present Illness Patient admitted with weakness, fever, and pain following cystoscopy with lithotripsy and indwelling catheter few days. Has h/o CABG, prostate CA and CAD.    PT Comments    Patient progressing well with mobility. Tolerated increase in ambulation distance today with only mild balance concerns. Discharge recommendation updated as pt getting PICC line for antibiotics at home and therefore cannot get OPPT as well. Recommendation updated to HHPT. SW present in room, aware and discussed with family/patient. Will assess gait with SPC next session as pt uses walking stick at home PRN. Will continue to follow and progress as tolerated.   Follow Up Recommendations  Home health PT;Supervision for mobility/OOB     Equipment Recommendations  Cane    Recommendations for Other Services       Precautions / Restrictions Precautions Precautions: Fall Precaution Comments: has had 1 fall in 6 months.  Wife reports he tends to lean forward and get going too fast. Restrictions Weight Bearing Restrictions: No    Mobility  Bed Mobility Overal bed mobility: Modified Independent                Transfers Overall transfer level: Needs assistance Equipment used: None Transfers: Sit to/from Stand Sit to Stand: Supervision         General transfer comment: Supervision for safety and to slow down. Transferred on/off toilet and performed pericare (I).  Ambulation/Gait Ambulation/Gait assistance: Min guard Ambulation Distance (Feet): 150 Feet (x2 bouts.) Assistive device: None Gait Pattern/deviations: Step-through pattern;Drifts right/left;Shuffle Gait velocity: changes in gait speed noted.   General Gait Details: Pt with anterior bias at times and occasionally drifts to Right/left. Changes in gait speed noted. VC to decrease speed.  Incontinent of stool.    Stairs Stairs: Yes Stairs assistance: Min guard Stair Management: One rail Right Number of Stairs: 3 General stair comments: VC for step to gait pattern when negotiating steps for safety. Demonstrated appropriately.  Wheelchair Mobility    Modified Rankin (Stroke Patients Only)       Balance     Sitting balance-Leahy Scale: Good       Standing balance-Leahy Scale: Good Standing balance comment: Able to perform pericare, wash hands and empty foley catheter demonstrating changes in weightshift without LOB. Some concern during gait due to forward leaning which increases gait speed.                     Cognition Arousal/Alertness: Awake/alert Behavior During Therapy: WFL for tasks assessed/performed Overall Cognitive Status: History of cognitive impairments - at baseline                      Exercises General Exercises - Lower Extremity Ankle Circles/Pumps: Both;10 reps;Seated Long Arc Quad: Both;10 reps;Seated Hip Flexion/Marching: Both;10 reps;Seated    General Comments General comments (skin integrity, edema, etc.): Pt incontinent of stool during ambulation bout.      Pertinent Vitals/Pain Pain Assessment: No/denies pain    Home Living                      Prior Function            PT Goals (current goals can now be found in the care plan section) Progress towards PT goals: Progressing toward goals    Frequency  Min 3X/week    PT Plan Discharge plan  needs to be updated    Co-evaluation             End of Session Equipment Utilized During Treatment: Gait belt Activity Tolerance: Patient tolerated treatment well Patient left: in bed;with call bell/phone within reach;with bed alarm set;with family/visitor present (sitting EOB.)     Time: 1527-1600 PT Time Calculation (min): 33 min  Charges:  $Gait Training: 8-22 mins $Therapeutic Activity: 8-22 mins                    G CodesCandy Sledge A 01-29-14, 4:28 PM [name], [place], DPT [phone]

## 2014-01-06 NOTE — Progress Notes (Signed)
    [facility] for Infectious Disease    Date of Admission:  01/02/2014   Total days of antibiotics 6        Day 2 ampicillin        Day  2 vancomycin           ID: Ronald [name] is a 80 y.o. male with  Amp S enterococcal bacteremia in setting of manipulation of foley catheter/urologic procedure Principal Problem:   Urinary tract infection Active Problems:   HYPERCHOLESTEROLEMIA   Coronary atherosclerosis   HTN (hypertension)   PROSTATE CANCER, HX OF   Bladder calculus   SIRS (systemic inflammatory response syndrome)   Chronic systolic CHF (congestive heart failure)    Subjective: Afebrile. Underwent TTE without difficulty  Medications:  . ampicillin (OMNIPEN) IV  2 g Intravenous 4 times per day  . aspirin EC  81 mg Oral Daily  . atorvastatin  40 mg Oral QPM  . cholecalciferol  2,000 Units Oral Daily  . enoxaparin (LOVENOX) injection  40 mg Subcutaneous Q24H  . famotidine  20 mg Oral BID  . galantamine  8 mg Oral Q breakfast  . isosorbide mononitrate  30 mg Oral q morning - 10a  . multivitamin with minerals  1 tablet Oral Daily  . polyvinyl alcohol  1 drop Both Eyes BID  . tamsulosin  0.4 mg Oral Daily    Objective: Vital signs in last 24 hours: Temp:  [97.6 F (36.4 C)-98.8 F (37.1 C)] 98.8 F (37.1 C) (10/12 1346) Pulse Rate:  [60-65] 60 (10/12 1346) Resp:  [16-18] 16 (10/12 1346) BP: (122-133)/(50-80) 133/80 mmHg (10/12 1346) SpO2:  [95 %-97 %] 97 % (10/12 1346)  Physical Exam  Constitutional: He is oriented to person, place, and time. He appears well-developed and well-nourished. No distress.  HENT:  Mouth/Throat: Oropharynx is clear and moist. No oropharyngeal exudate.  Cardiovascular: Normal rate, regular rhythm and normal heart sounds. Exam reveals no gallop and no friction rub.  No murmur heard.  Pulmonary/Chest: Effort normal and breath sounds normal. No respiratory distress. He has no wheezes.  Abdominal: Soft. Bowel sounds are normal. He  exhibits no distension. There is no tenderness.  Lymphadenopathy:  He has no cervical adenopathy.  Neurological: He is alert and oriented to person, place, and time.  Skin: Skin is warm and dry. No rash noted. No erythema.  Psychiatric: He has a normal mood and affect. His behavior is normal.    Lab Results  Recent Labs  01/04/14 0344  WBC 9.0  HGB 12.6*  HCT 37.9*  NA 139  K 3.8  CL 104  CO2 25  BUN 26*  CREATININE 1.18    Microbiology: 10/8 blood cx 1 of 2 sets amp S enterococcus 10/10 blood cx x 2 ngtd  Studies/Results: No results found.   Assessment/Plan: Enterococcal bacteremia = likely resulting from recent urologic procedures/foley catheter. Recommend to treat for 12 more days of with ampicillin IV to complete 14 day course. Will place order for picc line. Coordinated with advance to do continuous infusion of ampicillin  We will see him back in clinic in 10-12 days.   Will sign off  [name] [facility] for Infectious Diseases Cell: [phone] Pager: [phone]  01/06/2014, 3:35 PM

## 2014-01-06 NOTE — Progress Notes (Addendum)
PATIENT DETAILS Name: Ronald [name] Age: 80 y.o. Sex: male Date of Birth: [birth year] Admit Date: 01/02/2014 Admitting Physician Evalee Mutton Kristeen Mans, MD [name] [name], MD  Subjective: Dysuria continues-otherwise no major events  Assessment/Plan: Principal Problem:   Urinary tract infection/Enterococcal Bacteremia -Recently had cystoscopy and lithotripsy of a bladder stone, following which he required a Foley catheterization. Foley catheter was removed 2 days back.Suspect Catheter related UTI. Admitted and started on IV Rocephin, clinically improved, initial urine culture on admission unfortunately showed multiple bacterial morphotypes, one set of blood culture done on 10/8 growing enterococcus. ID consulted, IV Rocephin discontinued, now on vancomycin and ampicillin-day 2. Repeat blood cultures on 10/10 negative so far, urine culture on 10/10 and negative as well.Await TTE - Will discuss with infectious disease regarding placement of PICC line and IV antibiotics on discharge.  Active Problems: SIR's -secondary to above, resolved with Abx  Dysuria with ?hematuria -given recent history of cystoscopy with urethral dilatation and lithotripsy of Bladder calculus-appreciate Urology assistance. Started on antispasmodics-repeat UA confirms microscopic hematuria  HTN (hypertension)  - BP-Controlled- continue with Imdur for now-we'll continue to monitor blood pressure readings and adjust accordingly.   HYPERCHOLESTEROLEMIA  - Continue statins.   Coronary atherosclerosis  - Continue with aspirin, statin. Not on beta blockers as an outpatient.   Recent history of cystoscopy with urethral dilatation and lithotripsy of Bladder calculus -will need Urology follow up post discharge  Thrombocytopenia -appears chronic-watch platelet while on prophylactic Lovenox  BPH -c/w Flomax  Hx of Prostate Cancer -outpatient Urology follow up  Chronic Systolic CHF -compensated-EF  around 35-40 percent by Cardiac Cath June 2015  Disposition: Remain inpatient  DVT Prophylaxis: Prophylactic Lovenox -watch platelets  Code Status: Full code   Family Communication Spouse at bedside  Procedures:  None  CONSULTS:  None   MEDICATIONS: Scheduled Meds: . ampicillin (OMNIPEN) IV  2 g Intravenous 4 times per day  . aspirin EC  81 mg Oral Daily  . atorvastatin  40 mg Oral QPM  . cholecalciferol  2,000 Units Oral Daily  . enoxaparin (LOVENOX) injection  40 mg Subcutaneous Q24H  . famotidine  20 mg Oral BID  . galantamine  8 mg Oral Q breakfast  . isosorbide mononitrate  30 mg Oral q morning - 10a  . multivitamin with minerals  1 tablet Oral Daily  . polyvinyl alcohol  1 drop Both Eyes BID  . tamsulosin  0.4 mg Oral Daily  . vancomycin  1,000 mg Intravenous Q24H   Continuous Infusions:  PRN Meds:.acetaminophen, acetaminophen, albuterol, alum & mag hydroxide-simeth, guaiFENesin-dextromethorphan, HYDROcodone-acetaminophen, morphine injection, nitroGLYCERIN, ondansetron (ZOFRAN) IV, ondansetron, URELLE  Antibiotics: Anti-infectives   Start     Dose/Rate Route Frequency Ordered Stop   01/05/14 1400  ampicillin (OMNIPEN) 2 g in sodium chloride 0.9 % 50 mL IVPB     2 g 150 mL/hr over 20 Minutes Intravenous 4 times per day 01/05/14 1245     01/05/14 1330  vancomycin (VANCOCIN) IVPB 1000 mg/200 mL premix     1,000 mg 200 mL/hr over 60 Minutes Intravenous Every 24 hours 01/05/14 1311     01/04/14 0500  vancomycin (VANCOCIN) IVPB 750 mg/150 ml premix  Status:  Discontinued     750 mg 150 mL/hr over 60 Minutes Intravenous Every 12 hours 01/04/14 0426 01/04/14 1207   01/03/14 0600  cefTRIAXone (ROCEPHIN) 1 g in dextrose 5 % 50 mL IVPB  Status:  Discontinued     1  g 100 mL/hr over 30 Minutes Intravenous Every 24 hours 01/02/14 1658 01/05/14 1245   01/02/14 1800  cefTRIAXone (ROCEPHIN) 1 g in dextrose 5 % 50 mL IVPB  Status:  Discontinued     1 g 100 mL/hr over 30  Minutes Intravenous Every 24 hours 01/02/14 1657 01/02/14 1658   01/02/14 1245  cefTRIAXone (ROCEPHIN) 1 g in dextrose 5 % 50 mL IVPB  Status:  Discontinued     1 g 100 mL/hr over 30 Minutes Intravenous Every 24 hours 01/02/14 1232 01/02/14 1657   01/02/14 0600  cefTRIAXone (ROCEPHIN) 1 g in dextrose 5 % 50 mL IVPB     1 g 100 mL/hr over 30 Minutes Intravenous  Once 01/02/14 0554 01/02/14 0648       PHYSICAL EXAM: Vital signs in last 24 hours: Filed Vitals:   01/05/14 0556 01/05/14 1438 01/05/14 2120 01/06/14 0538  BP: 120/67 123/51 123/55 122/50  Pulse: 83 69 65   Temp: 98.8 F (37.1 C) 98.7 F (37.1 C) 97.6 F (36.4 C) 98.3 F (36.8 C)  TempSrc: Oral Oral Oral Oral  Resp: 17 17 18 18   Height:      Weight:      SpO2: 96% 97% 95% 96%    Weight change:  Filed Weights   01/01/14 2245 01/02/14 1445  Weight: 71.215 kg (157 lb) 70.3 kg (154 lb 15.7 oz)   Body mass index is 25.79 kg/(m^2).   Gen Exam: Awake and alert with clear speech.   Neck: Supple, No JVD.   Chest: B/L Clear.  No rales or rhonchi CVS: S1 S2 Regular, no murmurs.  Abdomen: soft, BS +, non tender, non distended.  Extremities: no edema, lower extremities warm to touch. Neurologic: Non Focal.   Skin: No Rash.   Wounds: N/A.   Intake/Output from previous day:  Intake/Output Summary (Last 24 hours) at 01/06/14 0945 Last data filed at 01/06/14 [id number]  Gross per 24 hour  Intake    520 ml  Output   1200 ml  Net   -680 ml     LAB RESULTS: CBC  Recent Labs Lab 01/01/14 2315 01/03/14 0537 01/04/14 0344  WBC 10.8* 13.4* 9.0  HGB 13.9 14.3 12.6*  HCT 41.4 42.5 37.9*  PLT 151 98* 95*  MCV 89.0 89.3 89.0  MCH 29.9 30.0 29.6  MCHC 33.6 33.6 33.2  RDW 13.0 13.1 13.3  LYMPHSABS 0.2*  --   --   MONOABS 1.4*  --   --   EOSABS 0.0  --   --   BASOSABS 0.0  --   --     Chemistries   Recent Labs Lab 01/01/14 2315 01/03/14 0537 01/04/14 0344  NA 135* 137 139  K 5.0 3.7 3.8  CL 100 102 104  CO2  19 24 25   GLUCOSE 131* 122* 112*  BUN 22 18 26*  CREATININE 0.95 1.10 1.18  CALCIUM 8.8 8.3* 8.3*    CBG: No results found for this basename: GLUCAP,  in the last 168 hours  GFR Estimated Creatinine Clearance: 40.5 ml/min (by C-G formula based on Cr of 1.18).  Coagulation profile No results found for this basename: INR, PROTIME,  in the last 168 hours  Cardiac Enzymes No results found for this basename: CK, CKMB, TROPONINI, MYOGLOBIN,  in the last 168 hours  No components found with this basename: POCBNP,  No results found for this basename: DDIMER,  in the last 72 hours No results found for this basename: HGBA1C,  in the last 72 hours No results found for this basename: CHOL, HDL, LDLCALC, TRIG, CHOLHDL, LDLDIRECT,  in the last 72 hours No results found for this basename: TSH, T4TOTAL, FREET3, T3FREE, THYROIDAB,  in the last 72 hours No results found for this basename: VITAMINB12, FOLATE, FERRITIN, TIBC, IRON, RETICCTPCT,  in the last 72 hours No results found for this basename: LIPASE, AMYLASE,  in the last 72 hours  Urine Studies No results found for this basename: UACOL, UAPR, USPG, UPH, UTP, UGL, UKET, UBIL, UHGB, UNIT, UROB, ULEU, UEPI, UWBC, URBC, UBAC, CAST, CRYS, UCOM, BILUA,  in the last 72 hours  MICROBIOLOGY: Recent Results (from the past 240 hour(s))  URINE CULTURE     Status: None   Collection Time    01/02/14  6:04 AM      Result Value Ref Range Status   Specimen Description URINE, CLEAN CATCH   Final   Special Requests NONE   Final   Culture  Setup Time     Final   Value: 2020-03-1213 08:53     Performed at [place]     Final   Value: 80,000 COLONIES/ML     Performed at [facility]   Culture     Final   Value: Multiple bacterial morphotypes present, none predominant. Suggest appropriate recollection if clinically indicated.     Performed at [facility]   Report Status 01/03/2014 FINAL   Final  CULTURE, BLOOD  (ROUTINE X 2)     Status: None   Collection Time    01/02/14  8:35 AM      Result Value Ref Range Status   Specimen Description BLOOD RIGHT ANTECUBITAL   Final   Special Requests BOTTLES DRAWN AEROBIC AND ANAEROBIC 10MLS   Final   Culture  Setup Time     Final   Value: 2020-03-1213 12:56     Performed at [facility]   Culture     Final   Value:        BLOOD CULTURE RECEIVED NO GROWTH TO DATE CULTURE WILL BE HELD FOR 5 DAYS BEFORE ISSUING A FINAL NEGATIVE REPORT     Performed at [facility]   Report Status PENDING   Incomplete  CULTURE, BLOOD (ROUTINE X 2)     Status: None   Collection Time    01/02/14  8:45 AM      Result Value Ref Range Status   Specimen Description BLOOD HAND RIGHT   Final   Special Requests BOTTLES DRAWN AEROBIC AND ANAEROBIC 10CC   Final   Culture  Setup Time     Final   Value: 2020-03-1213 12:56     Performed at [facility]   Culture     Final   Value: ENTEROCOCCUS SPECIES     Note: Gram Stain Report Called to,Read Back By and Verified With: APRIL THOMPSON 01/04/14 AT 0335 RIDK PREVIOSLY REPORTED AS GRAM POSITIVE RODS CORRECTED RESULTS CALLED TO: ROBIN ZELLNER 01/05/14 @ 12:22PM BY RUSCOE A.     Performed at [facility]   Report Status 01/06/2014 FINAL   Final   Organism ID, Bacteria ENTEROCOCCUS SPECIES   Final  CULTURE, BLOOD (ROUTINE X 2)     Status: None   Collection Time    01/04/14  7:45 AM      Result Value Ref Range Status   Specimen Description BLOOD RIGHT ARM   Final   Special Requests BOTTLES DRAWN  AEROBIC ONLY [facility]   Final   Culture  Setup Time     Final   Value: 01/04/2014 19:41     Performed at [facility]   Culture     Final   Value:        BLOOD CULTURE RECEIVED NO GROWTH TO DATE CULTURE WILL BE HELD FOR 5 DAYS BEFORE ISSUING A FINAL NEGATIVE REPORT     Performed at [facility]   Report Status PENDING   Incomplete  CULTURE, BLOOD (ROUTINE X 2)     Status: None   Collection Time     01/04/14  7:55 AM      Result Value Ref Range Status   Specimen Description BLOOD RIGHT HAND   Final   Special Requests BOTTLES DRAWN AEROBIC ONLY 3CC   Final   Culture  Setup Time     Final   Value: 01/04/2014 19:41     Performed at [facility]   Culture     Final   Value:        BLOOD CULTURE RECEIVED NO GROWTH TO DATE CULTURE WILL BE HELD FOR 5 DAYS BEFORE ISSUING A FINAL NEGATIVE REPORT     Performed at [facility]   Report Status PENDING   Incomplete  URINE CULTURE     Status: None   Collection Time    01/04/14  2:54 PM      Result Value Ref Range Status   Specimen Description URINE, RANDOM   Final   Special Requests NONE   Final   Culture  Setup Time     Final   Value: 01/04/2014 21:51     Performed at [place]     Final   Value: NO GROWTH     Performed at [facility]   Culture     Final   Value: NO GROWTH     Performed at [facility]   Report Status 01/06/2014 FINAL   Final    RADIOLOGY STUDIES/RESULTS: No results found.  Oren Binet, MD  [facility] Pager:[phone]  If 7PM-7AM, please contact night-coverage [email or web address] Password [email or web address] 01/06/2014, 9:45 AM   LOS: 4 days

## 2014-01-07 ENCOUNTER — Telehealth: Payer: Self-pay | Admitting: Internal Medicine

## 2014-01-07 DIAGNOSIS — I2510 Atherosclerotic heart disease of native coronary artery without angina pectoris: Secondary | ICD-10-CM | POA: Diagnosis not present

## 2014-01-07 DIAGNOSIS — Z452 Encounter for adjustment and management of vascular access device: Secondary | ICD-10-CM | POA: Diagnosis not present

## 2014-01-07 DIAGNOSIS — I5022 Chronic systolic (congestive) heart failure: Secondary | ICD-10-CM | POA: Diagnosis not present

## 2014-01-07 DIAGNOSIS — I10 Essential (primary) hypertension: Secondary | ICD-10-CM | POA: Diagnosis not present

## 2014-01-07 DIAGNOSIS — N390 Urinary tract infection, site not specified: Secondary | ICD-10-CM | POA: Diagnosis not present

## 2014-01-07 DIAGNOSIS — Z8546 Personal history of malignant neoplasm of prostate: Secondary | ICD-10-CM | POA: Diagnosis not present

## 2014-01-07 LAB — HIV-1 RNA QUANT-NO REFLEX-BLD
HIV 1 RNA Quant: <20 copies/mL (ref <20)
HIV-1 RNA Quant, Log: <1.3 {Log} (ref <1.30)

## 2014-01-07 LAB — CREATININE, SERUM
Creatinine, Ser: 0.97 mg/dL (ref 0.50–1.35)
GFR calc Af Amer: 85 mL/min — ABNORMAL LOW (ref >90)
GFR calc non Af Amer: 74 mL/min — ABNORMAL LOW (ref >90)

## 2014-01-07 MED ORDER — SODIUM CHLORIDE 0.9 % IV SOLN: 8.0000 g | INTRAVENOUS | Status: DC

## 2014-01-07 MED ORDER — SODIUM CHLORIDE 0.9 % IV SOLN: 2.0000 g | Freq: Four times a day (QID) | INTRAVENOUS | Status: DC

## 2014-01-07 MED ORDER — URELLE 81 MG PO TABS: 1.0000 | ORAL_TABLET | Freq: Four times a day (QID) | ORAL | Status: DC | PRN

## 2014-01-07 MED ORDER — SODIUM CHLORIDE 0.9 % IJ SOLN: 10.0000 mL | INTRAMUSCULAR | Status: DC | PRN

## 2014-01-07 MED ORDER — AMPICILLIN SODIUM 10 G IV SOLR: 8.0000 g | INTRAVENOUS | Status: DC

## 2014-01-07 MED ORDER — ISOSORBIDE MONONITRATE ER 60 MG PO TB24: 30.0000 mg | ORAL_TABLET | Freq: Every morning | ORAL | Status: DC

## 2014-01-07 MED ORDER — HYDROCODONE-ACETAMINOPHEN 5-325 MG PO TABS: 1.0000 | ORAL_TABLET | Freq: Four times a day (QID) | ORAL | Status: DC | PRN

## 2014-01-07 NOTE — Discharge Summary (Addendum)
PATIENT DETAILS Name: Ronald [name] Age: 80 y.o. Sex: male Date of Birth: 25-Apr-1927 MRN: [id number]. Admitting Physician: Jonetta Osgood, MD [id number] [name], MD  Admit Date: 01/02/2014 Discharge date: 01/07/2014  Recommendations for Outpatient Follow-up:  1. Note-to complete 11 days of IV ampicillin from 01/07/14- for enterococcal bacteremia 2. Note-will need urology followup-patient still has dysuria intermittently. 3. Please check CBC and chemistries in one week  PRIMARY DISCHARGE DIAGNOSIS:  Principal Problem:   Urinary tract infection Active Problems:   HYPERCHOLESTEROLEMIA   Coronary atherosclerosis   HTN (hypertension)   PROSTATE CANCER, HX OF   Bladder calculus   SIRS (systemic inflammatory response syndrome)   Chronic systolic CHF (congestive heart failure)      PAST MEDICAL HISTORY: Past Medical History  Diagnosis Date  . Hyperlipidemia   . S/P CABG (coronary artery bypass graft)     [place]  . History of Bell's palsy     RIGHT SIDE-- NO RESIDUAL  . Gross hematuria   . Urethral stricture   . OAB (overactive bladder)   . Chronic cystitis   . Ischemic cardiomyopathy     ef 35-40% per cath 08-28-2013  . Coronary artery disease CARDIOLOGIST-  DR Grace Bushy  MI -- S/P  11/89 CABG x6 (70% circ; 90% PD; 60-70% distal left main; 30% left circ; 90% 1st diag;, 2nd diag and 3rd diag. w/90%,  mild stenosis LAD and 60-70% pLAD)  Re-do CABG x5 in 1997  . History of nonmelanoma skin cancer     excision's from scalp  . Wears hearing aid     bilateral  . Wears partial dentures   . At risk for sleep apnea     STOP-BANG= 4    SENT TO PCP 12-23-2013  . GERD (gastroesophageal reflux disease)   . Mild dementia   . Kidney stones "years ago"    "passed them"  . Hypertension     "not anymore" (01/02/2014)  . Myocardial infarction 1986; 1997  . Arthritis     "joints ache" (01/02/2014)  . Prostate cancer 1998    S/P  RADIACTIVE SEED  IMPLANTS; UROLOGIST-  DR GRAPEY  . Melanoma of ear     "right"    DISCHARGE MEDICATIONS:   Medication List         acetaminophen 500 MG tablet  Commonly known as:  TYLENOL  Take 500 mg by mouth every 6 (six) hours as needed for pain or fever. Take 1 -2 pills     ampicillin 8 g in sodium chloride 0.9 % 50 mL  Inject 8 g into the vein continuous. Daily for 11 more days from 01/07/14     aspirin 81 MG EC tablet  Take 1 tablet (81 mg total) by mouth daily.     atorvastatin 80 MG tablet  Commonly known as:  LIPITOR  Take 40 mg by mouth every evening.     CALCIUM + D PO  Take 1 tablet by mouth 2 (two) times daily.     galantamine 8 MG 24 hr capsule  Commonly known as:  RAZADYNE ER  Take 8 mg by mouth daily with breakfast.     HYDROcodone-acetaminophen 5-325 MG per tablet  Commonly known as:  NORCO/VICODIN  Take 1-2 tablets by mouth every 6 (six) hours as needed.     hydroxypropyl methylcellulose / hypromellose 2.5 % ophthalmic solution  Commonly known as:  ISOPTO TEARS / [place]  Place 1  drop into both eyes 2 (two) times daily.     ibuprofen 200 MG tablet  Commonly known as:  ADVIL,MOTRIN  Take 200 mg by mouth every 6 (six) hours as needed.     isosorbide mononitrate 60 MG 24 hr tablet  Commonly known as:  IMDUR  Take 0.5 tablets (30 mg total) by mouth every morning.     multivitamin with minerals Tabs tablet  Take 1 tablet by mouth daily.     nitroGLYCERIN 0.4 MG SL tablet  Commonly known as:  NITROSTAT  Place 0.4 mg under the tongue every 5 (five) minutes as needed for chest pain.     ranitidine 150 MG tablet  Commonly known as:  ZANTAC  Take 150 mg by mouth as needed for heartburn.     RAPAFLO 8 MG Caps capsule  Generic drug:  silodosin  Take 8 mg by mouth every evening.     URELLE 81 MG Tabs tablet  Take 1 tablet (81 mg total) by mouth every 6 (six) hours as needed for bladder spasms.     Vitamin D 2000 UNITS tablet  Take 2,000 Units by mouth daily.          ALLERGIES:   Allergies  Allergen Reactions  . Pantoprazole Other (See Comments)    Headache and lightheaded  . Ciprofloxacin Swelling and Hives    Allergy classified as swelling here, but pt says Cipro just doesn't work for him    . Nitrofurantoin Hives  . Sulfa Antibiotics Hives    BRIEF HPI:  See H&P, Labs, Consult and Test reports for all details in brief, patientis a 80 y.o. male with a Past Medical History of coronary disease status post CABG, history of prostate cancer, recent cystoscopy with balloon dilatation of the urethral stricture and lithotripsy of large bladder calculus following which he required a Foley catheter that was just removed a few days back who presented with fever, dysuria and worsening weakness. Was found to have UTI with scissors and admitted to the hospital for further evaluation and treatment  CONSULTATIONS:   ID and urology  PERTINENT RADIOLOGIC STUDIES: No results found.   PERTINENT LAB RESULTS: CBC: No results found for this basename: WBC, HGB, HCT, PLT,  in the last 72 hours CMET CMP     Component Value Date/Time   NA 139 01/04/2014 0344   K 3.8 01/04/2014 0344   CL 104 01/04/2014 0344   CO2 25 01/04/2014 0344   GLUCOSE 112* 01/04/2014 0344   BUN 26* 01/04/2014 0344   CREATININE 0.97 01/07/2014 0605   CALCIUM 8.3* 01/04/2014 0344   PROT 7.0 01/01/2014 2315   ALBUMIN 3.3* 01/01/2014 2315   AST 34 01/01/2014 2315   ALT 24 01/01/2014 2315   ALKPHOS 82 01/01/2014 2315   BILITOT 0.8 01/01/2014 2315   GFRNONAA 74* 01/07/2014 0605   GFRAA 85* 01/07/2014 0605    GFR Estimated Creatinine Clearance: 49.3 ml/min (by C-G formula based on Cr of 0.97). No results found for this basename: LIPASE, AMYLASE,  in the last 72 hours No results found for this basename: CKTOTAL, CKMB, CKMBINDEX, TROPONINI,  in the last 72 hours No components found with this basename: POCBNP,  No results found for this basename: DDIMER,  in the last 72 hours No  results found for this basename: HGBA1C,  in the last 72 hours No results found for this basename: CHOL, HDL, LDLCALC, TRIG, CHOLHDL, LDLDIRECT,  in the last 72 hours No results found for this basename:  TSH, T4TOTAL, FREET3, T3FREE, THYROIDAB,  in the last 72 hours No results found for this basename: VITAMINB12, FOLATE, FERRITIN, TIBC, IRON, RETICCTPCT,  in the last 72 hours Coags: No results found for this basename: PT, INR,  in the last 72 hours Microbiology: Recent Results (from the past 240 hour(s))  URINE CULTURE     Status: None   Collection Time    01/02/14  6:04 AM      Result Value Ref Range Status   Specimen Description URINE, CLEAN CATCH   Final   Special Requests NONE   Final   Culture  Setup Time     Final   Value: 03/13/2014 08:53     Performed at [facility]     Final   Value: 80,000 COLONIES/ML     Performed at [facility]   Culture     Final   Value: Multiple bacterial morphotypes present, none predominant. Suggest appropriate recollection if clinically indicated.     Performed at [facility]   Report Status 01/03/2014 FINAL   Final  CULTURE, BLOOD (ROUTINE X 2)     Status: None   Collection Time    01/02/14  8:35 AM      Result Value Ref Range Status   Specimen Description BLOOD RIGHT ANTECUBITAL   Final   Special Requests BOTTLES DRAWN AEROBIC AND ANAEROBIC 10MLS   Final   Culture  Setup Time     Final   Value: 03/13/2014 12:56     Performed at [facility]   Culture     Final   Value:        BLOOD CULTURE RECEIVED NO GROWTH TO DATE CULTURE WILL BE HELD FOR 5 DAYS BEFORE ISSUING A FINAL NEGATIVE REPORT     Performed at [facility]   Report Status PENDING   Incomplete  CULTURE, BLOOD (ROUTINE X 2)     Status: None   Collection Time    01/02/14  8:45 AM      Result Value Ref Range Status   Specimen Description BLOOD HAND RIGHT   Final   Special Requests BOTTLES DRAWN AEROBIC AND ANAEROBIC 10CC    Final   Culture  Setup Time     Final   Value: 03/13/2014 12:56     Performed at [facility]   Culture     Final   Value: ENTEROCOCCUS SPECIES     Note: Gram Stain Report Called to,Read Back By and Verified With: APRIL THOMPSON 01/04/14 AT 0335 RIDK PREVIOSLY REPORTED AS GRAM POSITIVE RODS CORRECTED RESULTS CALLED TO: ROBIN ZELLNER 01/05/14 @ 12:22PM BY RUSCOE A.     Performed at [facility]   Report Status 01/06/2014 FINAL   Final   Organism ID, Bacteria ENTEROCOCCUS SPECIES   Final  CULTURE, BLOOD (ROUTINE X 2)     Status: None   Collection Time    01/04/14  7:45 AM      Result Value Ref Range Status   Specimen Description BLOOD RIGHT ARM   Final   Special Requests BOTTLES DRAWN AEROBIC ONLY 6CC   Final   Culture  Setup Time     Final   Value: 01/04/2014 19:41     Performed at [facility]   Culture     Final   Value:        BLOOD CULTURE RECEIVED NO GROWTH TO DATE CULTURE WILL BE HELD FOR 5 DAYS BEFORE  ISSUING A FINAL NEGATIVE REPORT     Performed at [facility]   Report Status PENDING   Incomplete  CULTURE, BLOOD (ROUTINE X 2)     Status: None   Collection Time    01/04/14  7:55 AM      Result Value Ref Range Status   Specimen Description BLOOD RIGHT HAND   Final   Special Requests BOTTLES DRAWN AEROBIC ONLY 3CC   Final   Culture  Setup Time     Final   Value: 01/04/2014 19:41     Performed at [facility]   Culture     Final   Value:        BLOOD CULTURE RECEIVED NO GROWTH TO DATE CULTURE WILL BE HELD FOR 5 DAYS BEFORE ISSUING A FINAL NEGATIVE REPORT     Performed at [facility]   Report Status PENDING   Incomplete  URINE CULTURE     Status: None   Collection Time    01/04/14  2:54 PM      Result Value Ref Range Status   Specimen Description URINE, RANDOM   Final   Special Requests NONE   Final   Culture  Setup Time     Final   Value: 01/04/2014 21:51     Performed at [facility]     Final    Value: NO GROWTH     Performed at [facility]   Culture     Final   Value: NO GROWTH     Performed at [facility]   Report Status 01/06/2014 FINAL   Final     BRIEF HOSPITAL COURSE:  Urinary tract infection with Enterococcal Bacteremia  -Recently had cystoscopy and lithotripsy of a bladder stone, following which he required a Foley catheterization. Foley catheter was removed 2 days prior to admission.Suspect Catheter related UTI. Admitted and started on IV Rocephin, clinically improved, initial urine culture on admission unfortunately showed multiple bacterial morphotypes, one set of blood culture done on 10/8 growing enterococcus. ID consulted, IV Rocephin discontinued, now on vancomycin and ampicillin- and eventually tapered chest ampicillin. Repeat blood cultures on 10/10 negative so far, urine culture on 10/10 and negative as well. Transthoracic echocardiogram negative for vegetation. Current recommendations from infectious disease is to continue with IV ampicillin via PICC line for 11 more days from 01/07/14. Infectious disease will arrange for followup in the clinic.  SIR's  -secondary to above, resolved with Abx   Dysuria with ?hematuria  -given recent history of cystoscopy with urethral dilatation and lithotripsy of Bladder calculus-appreciate Urology assistance. Started on antispasmodics-repeat UA confirms microscopic hematuria . Further workup deferred to the outpatient setting, patient has been asked to make an appointment with his primary urologist  HTN (hypertension)  - BP-Controlled- continue with Imdur for now-we'll continue to monitor blood pressure readings and adjust accordingly.   HYPERCHOLESTEROLEMIA  - Continue statins.   Coronary atherosclerosis  - Continue with aspirin, statin. Not on beta blockers as an outpatient.   Recent history of cystoscopy with urethral dilatation and lithotripsy of Bladder calculus  -will need Urology follow up post  discharge  Thrombocytopenia  -appears chronic- followed closely as an inpatient while on prophylactic Lovenox, and last platelet count on 10/10 at 95 K. please continue to watch closely as outpatient.   BPH  -c/w Flomax   Hx of Prostate Cancer  -outpatient Urology follow up   Chronic Systolic CHF  -compensated-EF around 35-40  percent by Cardiac Cath June 2015   TODAY-DAY OF DISCHARGE:  Subjective:   Joeangel Prorok today has no headache,no chest abdominal pain,no new weakness tingling or numbness, feels much better wants to go home today.   Objective:   Blood pressure 109/47, pulse 51, temperature 97.6 F (36.4 C), temperature source Oral, resp. rate 16, height 5\' 5"  (1.651 m), weight 70.3 kg (154 lb 15.7 oz), SpO2 99.00%.  Intake/Output Summary (Last 24 hours) at 01/07/14 1532 Last data filed at 01/07/14 1214  Gross per 24 hour  Intake    640 ml  Output   1750 ml  Net  -1110 ml   Filed Weights   01/01/14 2245 01/02/14 1445  Weight: 71.215 kg (157 lb) 70.3 kg (154 lb 15.7 oz)    Exam Awake Alert, Oriented *3, No new F.N deficits, Normal affect [place].AT,PERRAL Supple Neck,No JVD, No cervical lymphadenopathy appriciated.  Symmetrical Chest wall movement, Good air movement bilaterally, CTAB RRR,No Gallops,Rubs or new Murmurs, No Parasternal Heave +ve B.Sounds, Abd Soft, Non tender, No organomegaly appriciated, No rebound -guarding or rigidity. No Cyanosis, Clubbing or edema, No new Rash or bruise  DISCHARGE CONDITION: Stable  DISPOSITION: Home with home health services  DISCHARGE INSTRUCTIONS:    Activity:  As tolerated   Diet recommendation: Heart Healthy diet  Discharge Instructions   Call MD for:  extreme fatigue    Complete by:  As directed      Call MD for:  persistant nausea and vomiting    Complete by:  As directed      Call MD for:  severe uncontrolled pain    Complete by:  As directed      Call MD for:  temperature >100.4    Complete by:  As directed       Diet - low sodium heart healthy    Complete by:  As directed      Increase activity slowly    Complete by:  As directed            Follow-up Information   Follow up with Nyoka Cowden, MD. Schedule an appointment as soon as possible for a visit on 01/13/2014. (Appointment with Dr. Burnice Logan is on 01/13/14 at 12:45pm)    Specialty:  Internal Medicine   Contact information:   [place] [address] [phone]       Follow up with Carlyle Basques, MD. Schedule an appointment as soon as possible for a visit in 10 days. (Doctor office will contact patient with appointment )    Specialty:  Infectious Diseases   Contact information:   [place] [address] [phone]       Follow up with [facility] S, MD. Schedule an appointment as soon as possible for a visit on 01/24/2014. (Appointment with Dr. Risa Grill is on Oct 30 at 930am)    Specialty:  Urology   Contact information:   [address] [place] [address] [phone]       Total Time spent on discharge equals 45 minutes.  SignedOren Binet 01/07/2014 3:32 PM

## 2014-01-07 NOTE — Telephone Encounter (Signed)
Ronald [name] wanted to let you know pt is being discharged from [facility] today and is being sent home with IV antibiotics and home care orders.  She also needs to know if Dr. Raliegh Ip will be his attending??  Please call back and advise.

## 2014-01-07 NOTE — Progress Notes (Signed)
Ronald [name] to be D/C'd Home per MD order.  Discussed with the patient and all questions fully answered.    Medication List         acetaminophen 500 MG tablet  Commonly known as:  TYLENOL  Take 500 mg by mouth every 6 (six) hours as needed for pain or fever. Take 1 -2 pills     ampicillin 8 g in sodium chloride 0.9 % 50 mL  Inject 8 g into the vein continuous. Daily for 11 more days from 01/07/14     aspirin 81 MG EC tablet  Take 1 tablet (81 mg total) by mouth daily.     atorvastatin 80 MG tablet  Commonly known as:  LIPITOR  Take 40 mg by mouth every evening.     CALCIUM + D PO  Take 1 tablet by mouth 2 (two) times daily.     galantamine 8 MG 24 hr capsule  Commonly known as:  RAZADYNE ER  Take 8 mg by mouth daily with breakfast.     HYDROcodone-acetaminophen 5-325 MG per tablet  Commonly known as:  NORCO/VICODIN  Take 1-2 tablets by mouth every 6 (six) hours as needed.     hydroxypropyl methylcellulose / hypromellose 2.5 % ophthalmic solution  Commonly known as:  ISOPTO TEARS / [place]  Place 1 drop into both eyes 2 (two) times daily.     ibuprofen 200 MG tablet  Commonly known as:  ADVIL,MOTRIN  Take 200 mg by mouth every 6 (six) hours as needed.     isosorbide mononitrate 60 MG 24 hr tablet  Commonly known as:  IMDUR  Take 0.5 tablets (30 mg total) by mouth every morning.     multivitamin with minerals Tabs tablet  Take 1 tablet by mouth daily.     nitroGLYCERIN 0.4 MG SL tablet  Commonly known as:  NITROSTAT  Place 0.4 mg under the tongue every 5 (five) minutes as needed for chest pain.     ranitidine 150 MG tablet  Commonly known as:  ZANTAC  Take 150 mg by mouth as needed for heartburn.     RAPAFLO 8 MG Caps capsule  Generic drug:  silodosin  Take 8 mg by mouth every evening.     URELLE 81 MG Tabs tablet  Take 1 tablet (81 mg total) by mouth every 6 (six) hours as needed for bladder spasms.     Vitamin D 2000 UNITS tablet  Take 2,000 Units  by mouth daily.        VVS, Skin clean, dry and intact without evidence of skin break down, no evidence of skin tears noted. IV catheter discontinued intact. Site without signs and symptoms of complications. Dressing and pressure applied.  An After Visit Summary was printed and given to the patient.  D/c education completed with patient/family including follow up instructions, medication list, d/c activities limitations if indicated, with other d/c instructions as indicated by MD - patient able to verbalize understanding, all questions fully answered.   Patient instructed to return to ED, call 911, or call MD for any changes in condition.   Patient escorted via [place], and D/C home via private auto.  Ronald [name] D 01/07/2014 3:37 PM

## 2014-01-07 NOTE — Progress Notes (Signed)
[facility]  Patient Status: New pt this admission to [facility]  [facility] is providing the following services: HHRN and [facility] for home IV ABX. [place] team will provide in hospital teaching with pt and wife re: home IV ABX to support transition home.    If patient discharges after hours, please call [phone].   Larry Sierras 01/07/2014, 10:51 AM

## 2014-01-07 NOTE — Progress Notes (Signed)
Peripherally Inserted Central Catheter/Midline Placement  The IV Nurse has discussed with the patient and/or persons authorized to consent for the patient, the purpose of this procedure and the potential benefits and risks involved with this procedure.  The benefits include less needle sticks, lab draws from the catheter and patient may be discharged home with the catheter.  Risks include, but not limited to, infection, bleeding, blood clot (thrombus formation), and puncture of an artery; nerve damage and irregular heat beat.  Alternatives to this procedure were also discussed.  PICC/Midline Placement Documentation        Ronald [name] 01/07/2014, 9:38 AM

## 2014-01-08 DIAGNOSIS — Z452 Encounter for adjustment and management of vascular access device: Secondary | ICD-10-CM | POA: Diagnosis not present

## 2014-01-08 DIAGNOSIS — Z8546 Personal history of malignant neoplasm of prostate: Secondary | ICD-10-CM | POA: Diagnosis not present

## 2014-01-08 DIAGNOSIS — N390 Urinary tract infection, site not specified: Secondary | ICD-10-CM | POA: Diagnosis not present

## 2014-01-08 DIAGNOSIS — I5022 Chronic systolic (congestive) heart failure: Secondary | ICD-10-CM | POA: Diagnosis not present

## 2014-01-08 DIAGNOSIS — I2510 Atherosclerotic heart disease of native coronary artery without angina pectoris: Secondary | ICD-10-CM | POA: Diagnosis not present

## 2014-01-08 DIAGNOSIS — I10 Essential (primary) hypertension: Secondary | ICD-10-CM | POA: Diagnosis not present

## 2014-01-08 LAB — CULTURE, BLOOD (ROUTINE X 2): Culture: NO GROWTH

## 2014-01-08 NOTE — Telephone Encounter (Signed)
Debbie w/ [facility] also called to inform us of the IV therapy for UTI and sepsis Physicla therapy has been ordered for general weakness.  Pt seen last nite. IV meds ordered for 11 days.

## 2014-01-08 NOTE — Telephone Encounter (Signed)
Please see message. °

## 2014-01-09 DIAGNOSIS — I10 Essential (primary) hypertension: Secondary | ICD-10-CM | POA: Diagnosis not present

## 2014-01-09 DIAGNOSIS — I2510 Atherosclerotic heart disease of native coronary artery without angina pectoris: Secondary | ICD-10-CM | POA: Diagnosis not present

## 2014-01-09 DIAGNOSIS — I5022 Chronic systolic (congestive) heart failure: Secondary | ICD-10-CM | POA: Diagnosis not present

## 2014-01-09 DIAGNOSIS — Z8546 Personal history of malignant neoplasm of prostate: Secondary | ICD-10-CM | POA: Diagnosis not present

## 2014-01-09 DIAGNOSIS — Z452 Encounter for adjustment and management of vascular access device: Secondary | ICD-10-CM | POA: Diagnosis not present

## 2014-01-09 DIAGNOSIS — N390 Urinary tract infection, site not specified: Secondary | ICD-10-CM | POA: Diagnosis not present

## 2014-01-09 NOTE — Telephone Encounter (Signed)
Yes, I will remain the attending

## 2014-01-10 DIAGNOSIS — I2510 Atherosclerotic heart disease of native coronary artery without angina pectoris: Secondary | ICD-10-CM | POA: Diagnosis not present

## 2014-01-10 DIAGNOSIS — Z8546 Personal history of malignant neoplasm of prostate: Secondary | ICD-10-CM | POA: Diagnosis not present

## 2014-01-10 DIAGNOSIS — N390 Urinary tract infection, site not specified: Secondary | ICD-10-CM | POA: Diagnosis not present

## 2014-01-10 DIAGNOSIS — I10 Essential (primary) hypertension: Secondary | ICD-10-CM | POA: Diagnosis not present

## 2014-01-10 DIAGNOSIS — Z452 Encounter for adjustment and management of vascular access device: Secondary | ICD-10-CM | POA: Diagnosis not present

## 2014-01-10 DIAGNOSIS — I5022 Chronic systolic (congestive) heart failure: Secondary | ICD-10-CM | POA: Diagnosis not present

## 2014-01-10 LAB — CULTURE, BLOOD (ROUTINE X 2)
Culture: NO GROWTH
Culture: NO GROWTH

## 2014-01-10 NOTE — Telephone Encounter (Signed)
Called Santiago Glad at [facility] told her Dr.K will remain the attending for the pt. Santiago Glad verbalized understanding.

## 2014-01-10 NOTE — Telephone Encounter (Signed)
FYI

## 2014-01-11 DIAGNOSIS — I5022 Chronic systolic (congestive) heart failure: Secondary | ICD-10-CM | POA: Diagnosis not present

## 2014-01-11 DIAGNOSIS — Z8546 Personal history of malignant neoplasm of prostate: Secondary | ICD-10-CM | POA: Diagnosis not present

## 2014-01-11 DIAGNOSIS — I2510 Atherosclerotic heart disease of native coronary artery without angina pectoris: Secondary | ICD-10-CM | POA: Diagnosis not present

## 2014-01-11 DIAGNOSIS — I10 Essential (primary) hypertension: Secondary | ICD-10-CM | POA: Diagnosis not present

## 2014-01-11 DIAGNOSIS — Z452 Encounter for adjustment and management of vascular access device: Secondary | ICD-10-CM | POA: Diagnosis not present

## 2014-01-11 DIAGNOSIS — N390 Urinary tract infection, site not specified: Secondary | ICD-10-CM | POA: Diagnosis not present

## 2014-01-13 ENCOUNTER — Ambulatory Visit: Payer: Medicare Other | Admitting: Internal Medicine

## 2014-01-13 DIAGNOSIS — I5022 Chronic systolic (congestive) heart failure: Secondary | ICD-10-CM | POA: Diagnosis not present

## 2014-01-13 DIAGNOSIS — I10 Essential (primary) hypertension: Secondary | ICD-10-CM | POA: Diagnosis not present

## 2014-01-13 DIAGNOSIS — N390 Urinary tract infection, site not specified: Secondary | ICD-10-CM | POA: Diagnosis not present

## 2014-01-13 DIAGNOSIS — I509 Heart failure, unspecified: Secondary | ICD-10-CM | POA: Diagnosis not present

## 2014-01-13 DIAGNOSIS — Z452 Encounter for adjustment and management of vascular access device: Secondary | ICD-10-CM | POA: Diagnosis not present

## 2014-01-13 DIAGNOSIS — Z8546 Personal history of malignant neoplasm of prostate: Secondary | ICD-10-CM | POA: Diagnosis not present

## 2014-01-13 DIAGNOSIS — I2510 Atherosclerotic heart disease of native coronary artery without angina pectoris: Secondary | ICD-10-CM | POA: Diagnosis not present

## 2014-01-16 ENCOUNTER — Encounter (HOSPITAL_COMMUNITY): Payer: Self-pay | Admitting: Emergency Medicine

## 2014-01-16 ENCOUNTER — Emergency Department (HOSPITAL_COMMUNITY)
Admission: EM | Admit: 2014-01-16 | Discharge: 2014-01-16 | Disposition: A | Discharge status: Home / Self Care | Payer: Medicare Other | Attending: Emergency Medicine | Admitting: Emergency Medicine

## 2014-01-16 DIAGNOSIS — Z7982 Long term (current) use of aspirin: Secondary | ICD-10-CM | POA: Insufficient documentation

## 2014-01-16 DIAGNOSIS — I252 Old myocardial infarction: Secondary | ICD-10-CM | POA: Insufficient documentation

## 2014-01-16 DIAGNOSIS — K219 Gastro-esophageal reflux disease without esophagitis: Secondary | ICD-10-CM | POA: Insufficient documentation

## 2014-01-16 DIAGNOSIS — Z792 Long term (current) use of antibiotics: Secondary | ICD-10-CM | POA: Diagnosis not present

## 2014-01-16 DIAGNOSIS — Z85828 Personal history of other malignant neoplasm of skin: Secondary | ICD-10-CM | POA: Insufficient documentation

## 2014-01-16 DIAGNOSIS — E785 Hyperlipidemia, unspecified: Secondary | ICD-10-CM | POA: Diagnosis not present

## 2014-01-16 DIAGNOSIS — Y832 Surgical operation with anastomosis, bypass or graft as the cause of abnormal reaction of the patient, or of later complication, without mention of misadventure at the time of the procedure: Secondary | ICD-10-CM | POA: Insufficient documentation

## 2014-01-16 DIAGNOSIS — Z87891 Personal history of nicotine dependence: Secondary | ICD-10-CM | POA: Diagnosis not present

## 2014-01-16 DIAGNOSIS — I10 Essential (primary) hypertension: Secondary | ICD-10-CM | POA: Diagnosis not present

## 2014-01-16 DIAGNOSIS — Z79899 Other long term (current) drug therapy: Secondary | ICD-10-CM | POA: Insufficient documentation

## 2014-01-16 DIAGNOSIS — Z8546 Personal history of malignant neoplasm of prostate: Secondary | ICD-10-CM | POA: Insufficient documentation

## 2014-01-16 DIAGNOSIS — I2510 Atherosclerotic heart disease of native coronary artery without angina pectoris: Secondary | ICD-10-CM | POA: Insufficient documentation

## 2014-01-16 DIAGNOSIS — T82538A Leakage of other cardiac and vascular devices and implants, initial encounter: Secondary | ICD-10-CM | POA: Insufficient documentation

## 2014-01-16 DIAGNOSIS — F0390 Unspecified dementia without behavioral disturbance: Secondary | ICD-10-CM | POA: Insufficient documentation

## 2014-01-16 DIAGNOSIS — N390 Urinary tract infection, site not specified: Secondary | ICD-10-CM | POA: Diagnosis not present

## 2014-01-16 DIAGNOSIS — T82898A Other specified complication of vascular prosthetic devices, implants and grafts, initial encounter: Secondary | ICD-10-CM

## 2014-01-16 DIAGNOSIS — N3020 Other chronic cystitis without hematuria: Secondary | ICD-10-CM | POA: Insufficient documentation

## 2014-01-16 DIAGNOSIS — M1990 Unspecified osteoarthritis, unspecified site: Secondary | ICD-10-CM | POA: Diagnosis not present

## 2014-01-16 DIAGNOSIS — Z87442 Personal history of urinary calculi: Secondary | ICD-10-CM | POA: Insufficient documentation

## 2014-01-16 DIAGNOSIS — Z452 Encounter for adjustment and management of vascular access device: Secondary | ICD-10-CM | POA: Diagnosis not present

## 2014-01-16 DIAGNOSIS — I5022 Chronic systolic (congestive) heart failure: Secondary | ICD-10-CM | POA: Diagnosis not present

## 2014-01-16 DIAGNOSIS — T82598A Other mechanical complication of other cardiac and vascular devices and implants, initial encounter: Secondary | ICD-10-CM | POA: Diagnosis not present

## 2014-01-16 NOTE — ED Notes (Signed)
Pt here from home for picc line leaking from insertion site.  Pt was sent home from hospital with picc line to take ampicillin to continue tx urosepsis.  Family states blood and medicine leaking from insertion site and verified by home healthcare RN.  Family states ampicillin is continuous flow and pt needs 2 more days of tx.

## 2014-01-16 NOTE — ED Notes (Signed)
IV team at the bedside. 

## 2014-01-16 NOTE — Discharge Instructions (Signed)
PICC Removal A peripherally inserted central catheter (PICC) is a long, thin, flexible tube that a health care provider can insert into a vein in your upper arm. It is a type of IV. Having a PICC in place gives health care providers quick access to your veins. It is a good way to distribute medicines and fluids quickly throughout your body. LET [facility] CARE PROVIDER KNOW ABOUT:  Any allergies you have.  All medicines you are taking, including vitamins, herbs, eye drops, creams, and over-the-counter medicines.  Previous problems you or members of your family have had with the use of anesthetics.  Any blood disorders you have.  Previous surgeries you have had.  Medical conditions you have. RISKS AND COMPLICATIONS Generally, this is a safe procedure. However, as with any procedure, problems can occur. Possible problems include:  Bleeding.  Infection. BEFORE THE PROCEDURE You need an order from your health care provider to have your PICC removed. Only a health care provider trained in PICC removal should take it out.You may have your PICC removed in the hospital or in an outpatient setting. PROCEDURE Having a PICC removed is usually painless. Removal of the tape that holds the PICC in place may be the most uncomfortable part. Do not take out the PICC yourself. Only a trained clinical professional, such as a PICC nurse, should remove it. If your health care provider thinks your PICC is infected, the tip may be sent to the lab for testing. After taking out your PICC, your health care provider may:   Hold gentle pressure on the exit site.  Apply some antibiotic ointment.  Place a small bandage over the insertion site. AFTER THE PROCEDURE You should be able to remove the bandage after 24 hours. Follow all your health care provider's instructions.   Keep the insertion site clean by washing it gently with soap and water.  Do not pick or remove a scab.  Avoid strenuous physical  activity for a day or two.  Let your health care provider know if you develop redness, soreness, bleeding, swelling, or drainage from the insertion site.  Let your health care provider know if you develop chills or fever. Document Released: 09/01/2009 Document Revised: 03/19/2013 Document Reviewed: 01/04/2013 [facility] Patient Information 2015 [place], [place]. This information is not intended to replace advice given to you by your health care provider. Make sure you discuss any questions you have with your health care provider.      PICC Home Guide A peripherally inserted central catheter (PICC) is a long, thin, flexible tube that is inserted into a vein in the upper arm. It is a form of intravenous (IV) access. It is considered to be a "central" line because the tip of the PICC ends in a large vein in your chest. This large vein is called the superior vena cava (SVC). The PICC tip ends in the SVC because there is a lot of blood flow in the SVC. This allows medicines and IV fluids to be quickly distributed throughout the body. The PICC is inserted using a sterile technique by a specially trained nurse or physician. After the PICC is inserted, a chest X-ray exam is done to be sure it is in the correct place.  A PICC may be placed for different reasons, such as:  To give medicines and liquid nutrition that can only be given through a central line. Examples are:  Certain antibiotic treatments.  Chemotherapy.  Total parenteral nutrition (TPN).  To take frequent blood samples.  To give IV fluids and blood products.  If there is difficulty placing a peripheral intravenous (PIV) catheter. If taken care of properly, a PICC can remain in place for several months. A PICC can also allow a person to go home from the hospital early. Medicine and PICC care can be managed at home by a family member or home health care team. [place] A PICC? Problems with a PICC can  occasionally occur. These may include the following:  A blood clot (thrombus) forming in or at the tip of the PICC. This can cause the PICC to become clogged. A clot-dissolving medicine called tissue plasminogen activator (tPA) can be given through the PICC to help break up the clot.  Inflammation of the vein (phlebitis) in which the PICC is placed. Signs of inflammation may include redness, pain at the insertion site, red streaks, or being able to feel a "cord" in the vein where the PICC is located.  Infection in the PICC or at the insertion site. Signs of infection may include fever, chills, redness, swelling, or pus drainage from the PICC insertion site.  PICC movement (malposition). The PICC tip may move from its original position due to excessive physical activity, forceful coughing, sneezing, or vomiting.  A break or cut in the PICC. It is important to not use scissors near the PICC.  Nerve or tendon irritation or injury during PICC insertion. WHAT SHOULD I KEEP IN MIND ABOUT ACTIVITIES WHEN I HAVE A PICC?  You may bend your arm and move it freely. If your PICC is near or at the bend of your elbow, avoid activity with repeated motion at the elbow.  Rest at home for the remainder of the day following PICC line insertion.  Avoid lifting heavy objects as instructed by your health care provider.  Avoid using a crutch with the arm on the same side as your PICC. You may need to use a walker. WHAT SHOULD I KNOW ABOUT MY PICC DRESSING?  Keep your PICC bandage (dressing) clean and dry to prevent infection.  Ask your health care provider when you may shower. Ask your health care provider to teach you how to wrap the PICC when you do take a shower.  Change the PICC dressing as instructed by your health care provider.  Change your PICC dressing if it becomes loose or wet. WHAT SHOULD I KNOW ABOUT PICC CARE?  Check the PICC insertion site daily for leakage, redness, swelling, or pain.  Do  not take a bath, swim, or use hot tubs when you have a PICC. Cover PICC line with clear plastic wrap and tape to keep it dry while showering.  Flush the PICC as directed by your health care provider. Let your health care provider know right away if the PICC is difficult to flush or does not flush. Do not use force to flush the PICC.  Do not use a syringe that is less than 10 mL to flush the PICC.  Never pull or tug on the PICC.  Avoid blood pressure checks on the arm with the PICC.  Keep your PICC identification card with you at all times.  Do not take the PICC out yourself. Only a trained clinical professional should remove the PICC. SEEK IMMEDIATE MEDICAL CARE IF:  Your PICC is accidentally pulled all the way out. If this happens, cover the insertion site with a bandage or gauze dressing. Do not throw the PICC away. Your health care provider  will need to inspect it.  Your PICC was tugged or pulled and has partially come out. Do not  push the PICC back in.  There is any type of drainage, redness, or swelling where the PICC enters the skin.  You cannot flush the PICC, it is difficult to flush, or the PICC leaks around the insertion site when it is flushed.  You hear a "flushing" sound when the PICC is flushed.  You have pain, discomfort, or numbness in your arm, shoulder, or jaw on the same side as the PICC.  You feel your heart "racing" or skipping beats.  You notice a hole or tear in the PICC.  You develop chills or a fever. MAKE SURE YOU:   Understand these instructions.  Will watch your condition.  Will get help right away if you are not doing well or get worse. Document Released: 09/18/2002 Document Revised: 07/29/2013 Document Reviewed: 11/19/2012 [facility] Patient Information 2015 [place], [place]. This information is not intended to replace advice given to you by your health care provider. Make sure you discuss any questions you have with your health care provider.

## 2014-01-16 NOTE — ED Provider Notes (Signed)
CSN: [id number]     Arrival date & time 01/16/14  1826 History   First MD Initiated Contact with Patient 01/16/14 2003     Chief Complaint  Patient presents with  . picc line leaking      (Consider location/radiation/quality/duration/timing/severity/associated sxs/prior Treatment) HPI 80 year old male presents with concerns that his PICC line is leaking. Patient is currently on ampicillin IV to treat enterococcus urosepsis. Patient has been feeling better from an illness standpoint over last couple days his nose intermittent fluid collecting under his Tegaderm as well as today he had a large amount of fluid and blood seemed to come out of his insertion site. The nurse that helps them at home indicated to them that it seemed like there may be a hole is when the antibiotics are being flushed in the seemed to be coming right back out. Denies a significant pain to the arm or any swelling.  Past Medical History  Diagnosis Date  . Hyperlipidemia   . S/P CABG (coronary artery bypass graft)     [place]  . History of Bell's palsy     RIGHT SIDE-- NO RESIDUAL  . Gross hematuria   . Urethral stricture   . OAB (overactive bladder)   . Chronic cystitis   . Ischemic cardiomyopathy     ef 35-40% per cath 08-28-2013  . Coronary artery disease CARDIOLOGIST-  DR Grace Bushy  MI -- S/P  11/89 CABG x6 (70% circ; 90% PD; 60-70% distal left main; 30% left circ; 90% 1st diag;, 2nd diag and 3rd diag. w/90%,  mild stenosis LAD and 60-70% pLAD)  Re-do CABG x5 in 1997  . History of nonmelanoma skin cancer     excision's from scalp  . Wears hearing aid     bilateral  . Wears partial dentures   . At risk for sleep apnea     STOP-BANG= 4    SENT TO PCP 12-23-2013  . GERD (gastroesophageal reflux disease)   . Mild dementia   . Kidney stones "years ago"    "passed them"  . Hypertension     "not anymore" (01/02/2014)  . Myocardial infarction 1986; 1997  . Arthritis     "joints ache"  (01/02/2014)  . Prostate cancer 1998    S/P  RADIACTIVE SEED IMPLANTS; UROLOGIST-  DR GRAPEY  . Melanoma of ear     "right"   Past Surgical History  Procedure Laterality Date  . Eus  10/05/2011    Procedure: ESOPHAGEAL ENDOSCOPIC ULTRASOUND (EUS) RADIAL;  Surgeon: Arta Silence, MD;  Location: [facility];  Service: Endoscopy;  Laterality: N/A;  . Tonsillectomy and adenoidectomy  1954  . Laparoscopic cholecystectomy  12-23-2005  . Radioactive prostate seed implants  1998  . Coronary artery bypass graft  11/ 1989  &  10/ 1997    1989-- 6 vessel/  1997 Re-do 5 vessel  . Cardiovascular stress test  08-01-2011  dr Angelena Form    inferior scar and possible soft tissue attenuation with minimal peri-infarct ischemia, small region of anterior ischemia and scar/  LVEF 53% LV wall motion with inferior hypokinesis/ no significant change from scan july 2011  . Cataract extraction w/ intraocular lens  implant, bilateral Bilateral 2007  . Cystoscopy with urethral dilatation N/A 12/25/2013    Procedure: CYSTOSCOPY WITH URETHRAL DILATATION, WITH BIOPSY;  Surgeon: Bernestine Amass, MD;  Location: [facility];  Service: Urology;  Laterality: N/A;  . Melanoma excision  X 1    "ear"  . Cardiac catheterization  02-22-2005  dr Vidal Schwalbe    mild to moderate lv dysfunction with inferobasilar akinesis/  totally occluded SVG to Intermediate Diagonal and SVG to PDA and RCA branches, totally occluded native coronary circulation with diffuse disease pLAD diagonal system with potentially could be ischemic/  patent SVG to OM with collaterals to dRCA and patent LIMA to LAD and diagonal systemss  . Cardiac catheterization  08-28-2013  DR Daneen Schick    widely patent sequential left internal graft to the diagonal/LAD, widely patent SVG to OM with proximal 50% narrowing noted in the graft, total occlusion SVG's to RCA, RI, and the Diagonal/ LV dysfunction with inferobasal aneurysm and mid anterior wall region of  akinesis/  overall EF 35-40%/  Total occlusion of the navtive circulation/  no significant change compared to 2006 cath  . Skin cancer excision  X 2    "top of head"   Family History  Problem Relation Age of Onset  . Heart disease Mother   . Diabetes Father   . Heart disease Brother    History  Substance Use Topics  . Smoking status: Former Smoker -- 40 years    Types: Cigars    Quit date: 12/24/1983  . Smokeless tobacco: Former Systems developer    Types: Chew    Quit date: 08/28/2013  . Alcohol Use: No    Review of Systems  Constitutional: Negative for fever.  Gastrointestinal: Negative for vomiting.  Musculoskeletal: Positive for joint swelling. Negative for arthralgias and myalgias.  All other systems reviewed and are negative.     Allergies  Pantoprazole; Ciprofloxacin; Nitrofurantoin; and Sulfa antibiotics  Home Medications   Prior to Admission medications   Medication Sig Start Date End Date Taking? Authorizing Provider  acetaminophen (TYLENOL) 500 MG tablet Take 500 mg by mouth every 6 (six) hours as needed for pain or fever. Take 1 -2 pills    Historical Provider, MD  ampicillin 8 g in sodium chloride 0.9 % 50 mL Inject 8 g into the vein continuous. Daily for 11 more days from 01/07/14 01/07/14   Jonetta Osgood, MD  aspirin EC 81 MG EC tablet Take 1 tablet (81 mg total) by mouth daily. 08/29/13   Brittainy Erie Noe, PA-C  atorvastatin (LIPITOR) 80 MG tablet Take 40 mg by mouth every evening. 09/23/13   Imogene Burn, PA-C  Calcium Carbonate-Vitamin D (CALCIUM + D PO) Take 1 tablet by mouth 2 (two) times daily.     Historical Provider, MD  Cholecalciferol (VITAMIN D) 2000 UNITS tablet Take 2,000 Units by mouth daily.    Historical Provider, MD  galantamine (RAZADYNE ER) 8 MG 24 hr capsule Take 8 mg by mouth daily with breakfast.    Historical Provider, MD  HYDROcodone-acetaminophen (NORCO/VICODIN) 5-325 MG per tablet Take 1-2 tablets by mouth every 6 (six) hours as needed.  01/07/14   Shanker Kristeen Mans, MD  hydroxypropyl methylcellulose (ISOPTO TEARS) 2.5 % ophthalmic solution Place 1 drop into both eyes 2 (two) times daily.     Historical Provider, MD  ibuprofen (ADVIL,MOTRIN) 200 MG tablet Take 200 mg by mouth every 6 (six) hours as needed.    Historical Provider, MD  isosorbide mononitrate (IMDUR) 60 MG 24 hr tablet Take 0.5 tablets (30 mg total) by mouth every morning. 01/07/14   Shanker Kristeen Mans, MD  Multiple Vitamin (MULTIVITAMIN WITH MINERALS) TABS Take 1 tablet by mouth daily.    Historical Provider, MD  nitroGLYCERIN (  NITROSTAT) 0.4 MG SL tablet Place 0.4 mg under the tongue every 5 (five) minutes as needed for chest pain.     Historical Provider, MD  ranitidine (ZANTAC) 150 MG tablet Take 150 mg by mouth as needed for heartburn.    Historical Provider, MD  silodosin (RAPAFLO) 8 MG CAPS capsule Take 8 mg by mouth every evening.     Historical Provider, MD  URELLE (URELLE/URISED) 81 MG TABS tablet Take 1 tablet (81 mg total) by mouth every 6 (six) hours as needed for bladder spasms. 01/07/14   Shanker Kristeen Mans, MD   BP 135/53  Pulse 57  Temp(Src) 98 F (36.7 C) (Oral)  Resp 14  SpO2 98% Physical Exam  Nursing note and vitals reviewed. Constitutional: He is oriented to person, place, and time. He appears well-developed and well-nourished.  HENT:  Head: Normocephalic and atraumatic.  Right Ear: External ear normal.  Left Ear: External ear normal.  Nose: Nose normal.  Eyes: Right eye exhibits no discharge. Left eye exhibits no discharge.  Cardiovascular: Normal rate.   Pulmonary/Chest: Effort normal.  Abdominal: He exhibits no distension.  Musculoskeletal: He exhibits no edema.       Right elbow: He exhibits no swelling. No tenderness found.       Arms: Neurological: He is alert and oriented to person, place, and time.  Skin: Skin is warm and dry.    ED Course  Procedures (including critical care time) Labs Review Labs Reviewed - No data to  display  Imaging Review No results found.   EKG Interpretation None      MDM   Final diagnoses:  Occluded PICC line, initial encounter    Discussed case with IV team, they evaluated line and it is apparently occluded and needs removal. Discussed with this advanced home care, and they are ok with discharge with a peripheral IV to get the next 2 days of his ampicillin. Their nurse will manage this at home.    Ephraim Hamburger, MD 01/17/14 [phone]

## 2014-01-17 DIAGNOSIS — Z452 Encounter for adjustment and management of vascular access device: Secondary | ICD-10-CM | POA: Diagnosis not present

## 2014-01-17 DIAGNOSIS — I5022 Chronic systolic (congestive) heart failure: Secondary | ICD-10-CM | POA: Diagnosis not present

## 2014-01-17 DIAGNOSIS — I10 Essential (primary) hypertension: Secondary | ICD-10-CM | POA: Diagnosis not present

## 2014-01-17 DIAGNOSIS — Z8546 Personal history of malignant neoplasm of prostate: Secondary | ICD-10-CM | POA: Diagnosis not present

## 2014-01-17 DIAGNOSIS — I2510 Atherosclerotic heart disease of native coronary artery without angina pectoris: Secondary | ICD-10-CM | POA: Diagnosis not present

## 2014-01-17 DIAGNOSIS — N390 Urinary tract infection, site not specified: Secondary | ICD-10-CM | POA: Diagnosis not present

## 2014-01-20 ENCOUNTER — Ambulatory Visit: Payer: Medicare Other | Admitting: Internal Medicine

## 2014-01-21 DIAGNOSIS — Z8546 Personal history of malignant neoplasm of prostate: Secondary | ICD-10-CM | POA: Diagnosis not present

## 2014-01-21 DIAGNOSIS — N390 Urinary tract infection, site not specified: Secondary | ICD-10-CM | POA: Diagnosis not present

## 2014-01-21 DIAGNOSIS — I2510 Atherosclerotic heart disease of native coronary artery without angina pectoris: Secondary | ICD-10-CM | POA: Diagnosis not present

## 2014-01-21 DIAGNOSIS — Z452 Encounter for adjustment and management of vascular access device: Secondary | ICD-10-CM | POA: Diagnosis not present

## 2014-01-21 DIAGNOSIS — I5022 Chronic systolic (congestive) heart failure: Secondary | ICD-10-CM | POA: Diagnosis not present

## 2014-01-21 DIAGNOSIS — I10 Essential (primary) hypertension: Secondary | ICD-10-CM | POA: Diagnosis not present

## 2014-01-22 ENCOUNTER — Ambulatory Visit (INDEPENDENT_AMBULATORY_CARE_PROVIDER_SITE_OTHER): Payer: Medicare Other | Admitting: Internal Medicine

## 2014-01-22 ENCOUNTER — Encounter: Payer: Self-pay | Admitting: Internal Medicine

## 2014-01-22 VITALS — BP 122/57 | HR 86 | Temp 97.8°F | Wt 157.0 lb

## 2014-01-22 DIAGNOSIS — I2510 Atherosclerotic heart disease of native coronary artery without angina pectoris: Secondary | ICD-10-CM | POA: Diagnosis not present

## 2014-01-22 DIAGNOSIS — R7881 Bacteremia: Secondary | ICD-10-CM | POA: Insufficient documentation

## 2014-01-22 DIAGNOSIS — B952 Enterococcus as the cause of diseases classified elsewhere: Secondary | ICD-10-CM | POA: Diagnosis not present

## 2014-01-22 NOTE — Assessment & Plan Note (Signed)
Did fine, no issues.  Off antibiotics and picc removed.  RTC PRN.

## 2014-01-22 NOTE — Progress Notes (Signed)
   Subjective:    Patient ID: KHALANI GAGNIER, male    DOB: 09-17-1927, 80 y.o.   MRN: [id number]  HPI 80 yo male with CAD s/p CABG, prostate cancer and recent cystoscopy with balloon dilatation of urethral stricuture and lithotripsy that then presented with dysuria, fever, chills.  Grew amp sensitive Enterococcus in urine and then 1/2 in blood.  Started on ampicillin continuous infusion and now has completed 14 days.  No fever, no chills, feels well, though weak overall.  No rash, no diarrhea.     Review of Systems  Constitutional: Positive for fatigue. Negative for fever and chills.  HENT: Negative for trouble swallowing.   Gastrointestinal: Negative for nausea and diarrhea.  Neurological: Negative for dizziness and light-headedness.       Objective:   Physical Exam  Constitutional: He appears well-developed and well-nourished. No distress.  Eyes: No scleral icterus.  Cardiovascular: Normal rate, regular rhythm and normal heart sounds.   No murmur heard. Pulmonary/Chest: Effort normal and breath sounds normal. No respiratory distress.  Lymphadenopathy:    He has no cervical adenopathy.  Skin: No rash noted.          Assessment & Plan:

## 2014-01-24 DIAGNOSIS — N3281 Overactive bladder: Secondary | ICD-10-CM | POA: Diagnosis not present

## 2014-01-24 DIAGNOSIS — N359 Urethral stricture, unspecified: Secondary | ICD-10-CM | POA: Diagnosis not present

## 2014-01-24 DIAGNOSIS — N3020 Other chronic cystitis without hematuria: Secondary | ICD-10-CM | POA: Diagnosis not present

## 2014-01-27 ENCOUNTER — Encounter: Payer: Self-pay | Admitting: Internal Medicine

## 2014-01-27 ENCOUNTER — Ambulatory Visit (INDEPENDENT_AMBULATORY_CARE_PROVIDER_SITE_OTHER): Payer: Medicare Other | Admitting: Internal Medicine

## 2014-01-27 VITALS — BP 110/60 | HR 53 | Temp 97.4°F | Resp 18 | Ht 65.0 in | Wt 157.0 lb

## 2014-01-27 DIAGNOSIS — I25810 Atherosclerosis of coronary artery bypass graft(s) without angina pectoris: Secondary | ICD-10-CM

## 2014-01-27 DIAGNOSIS — R7881 Bacteremia: Secondary | ICD-10-CM | POA: Diagnosis not present

## 2014-01-27 DIAGNOSIS — I5043 Acute on chronic combined systolic (congestive) and diastolic (congestive) heart failure: Secondary | ICD-10-CM

## 2014-01-27 DIAGNOSIS — I2510 Atherosclerotic heart disease of native coronary artery without angina pectoris: Secondary | ICD-10-CM

## 2014-01-27 DIAGNOSIS — I10 Essential (primary) hypertension: Secondary | ICD-10-CM

## 2014-01-27 DIAGNOSIS — B952 Enterococcus as the cause of diseases classified elsewhere: Secondary | ICD-10-CM

## 2014-01-27 NOTE — Progress Notes (Signed)
Pre visit review using our clinic review tool, if applicable. No additional management support is needed unless otherwise documented below in the visit note. 

## 2014-01-27 NOTE — Progress Notes (Signed)
Subjective:    Patient ID: Ronald [name], male    DOB: March 22, 1928, 80 y.o.   MRN: [id number]  HPI PATIENT DETAILS Name: Ronald [name] Age: 80 y.o. Sex: male Date of Birth: [birth year] MRN: [id number]. Admitting Physician: Jonetta Osgood, MD [name] Ronald Plate, MD  Admit Date: 01/02/2014 Discharge date: 01/07/2014  Recommendations for Outpatient Follow-up:  1. Note-to complete 11 days of IV ampicillin from 01/07/14- for enterococcal bacteremia 2. Note-will need urology followup-patient still has dysuria intermittently. 3. Please check CBC and chemistries in one week  PRIMARY DISCHARGE DIAGNOSIS: Principal Problem:  Urinary tract infection Active Problems:  HYPERCHOLESTEROLEMIA  Coronary atherosclerosis  HTN (hypertension)  PROSTATE CANCER, HX OF  Bladder calculus  SIRS (systemic inflammatory response syndrome)  Chronic systolic CHF (congestive heart failure)    80 year old patient who is seen following a recent hospital discharge for enterococcal sepsis.  He continues to improve and is a little bit more active each day.  Still significantly deconditioned.  He has been seen by ID recently and is off antibiotic therapy He has coronary artery disease and chronic systolic heart failure.  Blood pressure has remained in a low normal range.  Since his hospital discharge.  She has had some mild persistent peel edema, but no other symptoms of heart failure. No new concerns or complaints  Past Medical History  Diagnosis Date  . Hyperlipidemia   . S/P CABG (coronary artery bypass graft)     [facility]  . History of Bell's palsy     RIGHT SIDE-- NO RESIDUAL  . Gross hematuria   . Urethral stricture   . OAB (overactive bladder)   . Chronic cystitis   . Ischemic cardiomyopathy     ef 35-40% per cath 08-28-2013  . Coronary artery disease CARDIOLOGIST-  DR Grace Bushy  [place] -- S/P  11/89 CABG x6 (70% circ; 90% PD; 60-70% distal left main; 30%  left circ; 90% 1st diag;, 2nd diag and 3rd diag. w/90%,  mild stenosis LAD and 60-70% pLAD)  Re-do CABG x5 in 1997  . History of nonmelanoma skin cancer     excision's from scalp  . Wears hearing aid     bilateral  . Wears partial dentures   . At risk for sleep apnea     STOP-BANG= 4    SENT TO PCP 12-23-2013  . GERD (gastroesophageal reflux disease)   . Mild dementia   . Kidney stones "years ago"    "passed them"  . Hypertension     "not anymore" (01/02/2014)  . Myocardial infarction 1986; 1997  . Arthritis     "joints ache" (01/02/2014)  . Prostate cancer 1998    S/P  RADIACTIVE SEED IMPLANTS; UROLOGIST-  DR GRAPEY  . Melanoma of ear     "right"    History   Social History  . Marital Status: Married    Spouse Name: N/A    Number of Children: N/A  . Years of Education: N/A   Occupational History  . Not on file.   Social History Main Topics  . Smoking status: Former Smoker -- 40 years    Types: Cigars    Quit date: 12/24/1983  . Smokeless tobacco: Former Systems developer    Types: Chew    Quit date: 08/28/2013  . Alcohol Use: No  . Drug Use: No  . Sexual Activity: Not on file   Other Topics Concern  . Not on file  Social History Narrative    Past Surgical History  Procedure Laterality Date  . Eus  10/05/2011    Procedure: ESOPHAGEAL ENDOSCOPIC ULTRASOUND (EUS) RADIAL;  Surgeon: Arta Silence, MD;  Location: [facility];  Service: Endoscopy;  Laterality: N/A;  . Tonsillectomy and adenoidectomy  1954  . Laparoscopic cholecystectomy  12-23-2005  . Radioactive prostate seed implants  1998  . Coronary artery bypass graft  11/ 1989  &  10/ 1997    1989-- 6 vessel/  1997 Re-do 5 vessel  . Cardiovascular stress test  08-01-2011  dr Angelena Form    inferior scar and possible soft tissue attenuation with minimal peri-infarct ischemia, small region of anterior ischemia and scar/  LVEF 53% LV wall motion with inferior hypokinesis/ no significant change from scan july 2011  .  Cataract extraction w/ intraocular lens  implant, bilateral Bilateral 2007  . Cystoscopy with urethral dilatation N/A 12/25/2013    Procedure: CYSTOSCOPY WITH URETHRAL DILATATION, WITH BIOPSY;  Surgeon: Bernestine Amass, MD;  Location: [facility];  Service: Urology;  Laterality: N/A;  . Melanoma excision  X 1    "ear"  . Cardiac catheterization  02-22-2005  dr Vidal Schwalbe    mild to moderate lv dysfunction with inferobasilar akinesis/  totally occluded SVG to Intermediate Diagonal and SVG to PDA and RCA branches, totally occluded native coronary circulation with diffuse disease pLAD diagonal system with potentially could be ischemic/  patent SVG to OM with collaterals to dRCA and patent LIMA to LAD and diagonal systemss  . Cardiac catheterization  08-28-2013  DR Daneen Schick    widely patent sequential left internal graft to the diagonal/LAD, widely patent SVG to OM with proximal 50% narrowing noted in the graft, total occlusion SVG's to RCA, RI, and the Diagonal/ LV dysfunction with inferobasal aneurysm and mid anterior wall region of akinesis/  overall EF 35-40%/  Total occlusion of the navtive circulation/  no significant change compared to 2006 cath  . Skin cancer excision  X 2    "top of head"    Family History  Problem Relation Age of Onset  . Heart disease Mother   . Diabetes Father   . Heart disease Brother     Allergies  Allergen Reactions  . Pantoprazole Other (See Comments)    Headache and lightheaded  . Ciprofloxacin Swelling and Hives    Allergy classified as swelling here, but pt says Cipro just doesn't work for him    . Nitrofurantoin Hives  . Sulfa Antibiotics Hives and Itching    Current Outpatient Prescriptions on File Prior to Visit  Medication Sig Dispense Refill  . acetaminophen (TYLENOL) 500 MG tablet Take 500 mg by mouth every 6 (six) hours as needed for pain or fever. Take 1 -2 pills    . aspirin EC 81 MG EC tablet Take 1 tablet (81 mg total) by mouth  daily. 30 tablet 0  . atorvastatin (LIPITOR) 80 MG tablet Take 40 mg by mouth every evening.    . Calcium Carbonate-Vitamin D (CALCIUM + D PO) Take 1 tablet by mouth 2 (two) times daily.     . Cholecalciferol (VITAMIN D) 2000 UNITS tablet Take 2,000 Units by mouth daily.    Marland Kitchen galantamine (RAZADYNE ER) 8 MG 24 hr capsule Take 8 mg by mouth daily with breakfast.    . HYDROcodone-acetaminophen (NORCO/VICODIN) 5-325 MG per tablet Take 1 tablet by mouth every 6 (six) hours as needed for moderate pain.    . hydroxypropyl methylcellulose (  ISOPTO TEARS) 2.5 % ophthalmic solution Place 1 drop into both eyes 2 (two) times daily.     Marland Kitchen ibuprofen (ADVIL,MOTRIN) 200 MG tablet Take 200 mg by mouth every 6 (six) hours as needed.    . isosorbide mononitrate (IMDUR) 60 MG 24 hr tablet Take 0.5 tablets (30 mg total) by mouth every morning.    . Multiple Vitamin (MULTIVITAMIN WITH MINERALS) TABS Take 1 tablet by mouth daily.    . nitroGLYCERIN (NITROSTAT) 0.4 MG SL tablet Place 0.4 mg under the tongue every 5 (five) minutes as needed for chest pain.     . ranitidine (ZANTAC) 150 MG tablet Take 150 mg by mouth as needed for heartburn.    . silodosin (RAPAFLO) 8 MG CAPS capsule Take 8 mg by mouth every evening.     Marland Kitchen URELLE (URELLE/URISED) 81 MG TABS tablet Take 1 tablet (81 mg total) by mouth every 6 (six) hours as needed for bladder spasms. 120 each 0   No current facility-administered medications on file prior to visit.    BP 110/60 mmHg  Pulse 53  Temp(Src) 97.4 F (36.3 C) (Oral)  Resp 18  Ht 5\' 5"  (1.651 m)  Wt 157 lb (71.215 kg)  BMI 26.13 kg/m2  SpO2 96%      Review of Systems  Constitutional: Positive for fatigue. Negative for fever, chills and appetite change.  HENT: Negative for congestion, dental problem, ear pain, hearing loss, sore throat, tinnitus, trouble swallowing and voice change.   Eyes: Negative for pain, discharge and visual disturbance.  Respiratory: Negative for cough, chest  tightness, wheezing and stridor.   Cardiovascular: Negative for chest pain, palpitations and leg swelling.  Gastrointestinal: Negative for nausea, vomiting, abdominal pain, diarrhea, constipation, blood in stool and abdominal distention.  Genitourinary: Negative for urgency, hematuria, flank pain, discharge, difficulty urinating and genital sores.  Musculoskeletal: Positive for gait problem. Negative for myalgias, back pain, joint swelling, arthralgias and neck stiffness.  Skin: Negative for rash.  Neurological: Positive for weakness. Negative for dizziness, syncope, speech difficulty, numbness and headaches.  Hematological: Negative for adenopathy. Does not bruise/bleed easily.  Psychiatric/Behavioral: Negative for behavioral problems and dysphoric mood. The patient is not nervous/anxious.        Objective:   Physical Exam  Constitutional: He is oriented to person, place, and time. He appears well-developed.  Alert Deconditioned Blood pressure 100/60  HENT:  Head: Normocephalic.  Right Ear: External ear normal.  Left Ear: External ear normal.  Eyes: Conjunctivae and EOM are normal.  Neck: Normal range of motion.  Cardiovascular: Normal rate and normal heart sounds.   Pulmonary/Chest: Breath sounds normal.  Abdominal: Bowel sounds are normal.  Musculoskeletal: Normal range of motion. He exhibits no edema or tenderness.  Neurological: He is alert and oriented to person, place, and time.  Psychiatric: He has a normal mood and affect. His behavior is normal.          Assessment & Plan:   Status post enterococcal sepsis Chronic systolic heart failure, compensated Deconditioning Coronary artery disease History of hypertension.  Blood pressure presently low normal  No change in medical regimen.  Cardiology follow-up as scheduled Return here in the summer for his annual exam

## 2014-01-27 NOTE — Patient Instructions (Signed)
Limit your sodium (Salt) intake  Return in July for your annual exam

## 2014-01-28 DIAGNOSIS — N390 Urinary tract infection, site not specified: Secondary | ICD-10-CM | POA: Diagnosis not present

## 2014-01-28 DIAGNOSIS — I10 Essential (primary) hypertension: Secondary | ICD-10-CM | POA: Diagnosis not present

## 2014-01-28 DIAGNOSIS — Z452 Encounter for adjustment and management of vascular access device: Secondary | ICD-10-CM

## 2014-01-28 DIAGNOSIS — Z8546 Personal history of malignant neoplasm of prostate: Secondary | ICD-10-CM | POA: Diagnosis not present

## 2014-02-25 DIAGNOSIS — R350 Frequency of micturition: Secondary | ICD-10-CM | POA: Diagnosis not present

## 2014-02-25 DIAGNOSIS — N3281 Overactive bladder: Secondary | ICD-10-CM | POA: Diagnosis not present

## 2014-03-06 ENCOUNTER — Encounter (HOSPITAL_COMMUNITY): Payer: Self-pay | Admitting: Interventional Cardiology

## 2014-04-03 DIAGNOSIS — Z029 Encounter for administrative examinations, unspecified: Secondary | ICD-10-CM | POA: Diagnosis not present

## 2014-04-04 ENCOUNTER — Encounter: Payer: Self-pay | Admitting: Family Medicine

## 2014-04-04 ENCOUNTER — Ambulatory Visit (INDEPENDENT_AMBULATORY_CARE_PROVIDER_SITE_OTHER)
Admission: RE | Admit: 2014-04-04 | Discharge: 2014-04-04 | Disposition: A | Discharge status: Home / Self Care | Payer: Medicare Other | Source: Ambulatory Visit | Attending: Family Medicine | Admitting: Family Medicine

## 2014-04-04 ENCOUNTER — Ambulatory Visit (INDEPENDENT_AMBULATORY_CARE_PROVIDER_SITE_OTHER): Payer: Medicare Other | Admitting: Family Medicine

## 2014-04-04 VITALS — BP 120/70 | HR 55 | Temp 97.1°F | Wt 165.0 lb

## 2014-04-04 DIAGNOSIS — M79605 Pain in left leg: Secondary | ICD-10-CM | POA: Diagnosis not present

## 2014-04-04 DIAGNOSIS — M25552 Pain in left hip: Secondary | ICD-10-CM

## 2014-04-04 DIAGNOSIS — M545 Low back pain: Secondary | ICD-10-CM | POA: Diagnosis not present

## 2014-04-04 DIAGNOSIS — M1612 Unilateral primary osteoarthritis, left hip: Secondary | ICD-10-CM | POA: Diagnosis not present

## 2014-04-04 DIAGNOSIS — M859 Disorder of bone density and structure, unspecified: Secondary | ICD-10-CM | POA: Diagnosis not present

## 2014-04-04 NOTE — Patient Instructions (Signed)

## 2014-04-04 NOTE — Progress Notes (Signed)
Subjective:    Patient ID: Ronald [name], male    DOB: [birth year], 81 y.o.   MRN: [id number]  HPI Patient seen as a work in with left hip and left lower extremity pain. He's had some hip pains apparently for months but couple days of progressive left lower extremity pain. No claudication symptoms.  He describes a sharp pain 10 out of 10 severity at times. Tylenol helps slightly. Pain radiates from the left buttock all way down to the leg at times. He has not had any urine loss of urine or bowel control. Feels like he has difficulty supporting weight at times. Made worse after walking on treadmill couple days ago. He has remote history of prostate cancer. No appetite or weight changes. No recent fevers or chills. Denies any recent falls  Reviewed with no changes:   Past Medical History  Diagnosis Date  . Hyperlipidemia   . S/P CABG (coronary artery bypass graft)     [facility]  . History of Bell's palsy     RIGHT SIDE-- NO RESIDUAL  . Gross hematuria   . Urethral stricture   . OAB (overactive bladder)   . Chronic cystitis   . Ischemic cardiomyopathy     ef 35-40% per cath 08-28-2013  . Coronary artery disease CARDIOLOGIST-  DR Grace Bushy  [place] -- S/P  11/89 CABG x6 (70% circ; 90% PD; 60-70% distal left main; 30% left circ; 90% 1st diag;, 2nd diag and 3rd diag. w/90%,  mild stenosis LAD and 60-70% pLAD)  Re-do CABG x5 in 1997  . History of nonmelanoma skin cancer     excision's from scalp  . Wears hearing aid     bilateral  . Wears partial dentures   . At risk for sleep apnea     STOP-BANG= 4    SENT TO PCP 12-23-2013  . GERD (gastroesophageal reflux disease)   . Mild dementia   . Kidney stones "years ago"    "passed them"  . Hypertension     "not anymore" (01/02/2014)  . Myocardial infarction 1986; 1997  . Arthritis     "joints ache" (01/02/2014)  . Prostate cancer 1998    S/P  RADIACTIVE SEED IMPLANTS; UROLOGIST-  DR GRAPEY  . Melanoma of ear     "right"    Past Surgical History  Procedure Laterality Date  . Eus  10/05/2011    Procedure: ESOPHAGEAL ENDOSCOPIC ULTRASOUND (EUS) RADIAL;  Surgeon: Arta Silence, MD;  Location: [facility];  Service: Endoscopy;  Laterality: N/A;  . Tonsillectomy and adenoidectomy  1954  . Laparoscopic cholecystectomy  12-23-2005  . Radioactive prostate seed implants  1998  . Coronary artery bypass graft  11/ 1989  &  10/ 1997    1989-- 6 vessel/  1997 Re-do 5 vessel  . Cardiovascular stress test  08-01-2011  dr Angelena Form    inferior scar and possible soft tissue attenuation with minimal peri-infarct ischemia, small region of anterior ischemia and scar/  LVEF 53% LV wall motion with inferior hypokinesis/ no significant change from scan july 2011  . Cataract extraction w/ intraocular lens  implant, bilateral Bilateral 2007  . Cystoscopy with urethral dilatation N/A 12/25/2013    Procedure: CYSTOSCOPY WITH URETHRAL DILATATION, WITH BIOPSY;  Surgeon: Bernestine Amass, MD;  Location: [facility];  Service: Urology;  Laterality: N/A;  . Melanoma excision  X 1    "ear"  . Cardiac catheterization  02-22-2005  dr Vidal Schwalbe    mild to moderate lv dysfunction with inferobasilar akinesis/  totally occluded SVG to Intermediate Diagonal and SVG to PDA and RCA branches, totally occluded native coronary circulation with diffuse disease pLAD diagonal system with potentially could be ischemic/  patent SVG to OM with collaterals to dRCA and patent LIMA to LAD and diagonal systemss  . Cardiac catheterization  08-28-2013  DR Daneen Schick    widely patent sequential left internal graft to the diagonal/LAD, widely patent SVG to OM with proximal 50% narrowing noted in the graft, total occlusion SVG's to RCA, RI, and the Diagonal/ LV dysfunction with inferobasal aneurysm and mid anterior wall region of akinesis/  overall EF 35-40%/  Total occlusion of the navtive circulation/  no significant change compared to 2006 cath  . Skin  cancer excision  X 2    "top of head"  . Left heart catheterization with coronary/graft angiogram N/A 08/28/2013    Procedure: LEFT HEART CATHETERIZATION WITH Beatrix Fetters;  Surgeon: Sinclair Grooms, MD;  Location: [facility] CATH LAB;  Service: Cardiovascular;  Laterality: N/A;    reports that he quit smoking about 30 years ago. His smoking use included Cigars. He quit smokeless tobacco use about 7 months ago. His smokeless tobacco use included Chew. He reports that he does not drink alcohol or use illicit drugs. family history includes Diabetes in his father; Heart disease in his brother and mother. Allergies  Allergen Reactions  . Pantoprazole Other (See Comments)    Headache and lightheaded  . Ciprofloxacin Swelling and Hives    Allergy classified as swelling here, but pt says Cipro just doesn't work for him    . Nitrofurantoin Hives  . Sulfa Antibiotics Hives and Itching      Review of Systems  Constitutional: Negative for fever, activity change and appetite change.  Respiratory: Negative for cough and shortness of breath.   Cardiovascular: Negative for chest pain and leg swelling.  Gastrointestinal: Negative for vomiting and abdominal pain.  Genitourinary: Negative for dysuria, hematuria and flank pain.  Musculoskeletal: Positive for back pain. Negative for joint swelling.  Neurological: Positive for weakness. Negative for numbness.       Objective:   Physical Exam  Constitutional: He appears well-developed and well-nourished.  Cardiovascular: Normal rate and regular rhythm.   Pulmonary/Chest: Effort normal and breath sounds normal. No respiratory distress. He has no wheezes. He has no rales.  Musculoskeletal:  Patient has restricted range of motion left hip compared to right. He has some pain with internal Rotation. He has trace edema legs bilaterally. Straight leg raise are negative  Neurological:  Diminished left knee reflex compared to right. He also has some  weakness with left knee extension compared to right. He has full strength with plantarflexion and dorsiflexion bilaterally          Assessment & Plan:  Patient seen with left hip and left lower extremity pain. Suspect he has probably some significant osteoarthritis of left hip but also very likely lumbar nerve root compression- probably L3-L4. Slightly diminished strength with knee extension and left knee reflex above. Start with plain films lumbar and left hip x-ray. Will likely need MRI lumbar spine to further clarify.

## 2014-04-04 NOTE — Progress Notes (Signed)
Pre visit review using our clinic review tool, if applicable. No additional management support is needed unless otherwise documented below in the visit note. 

## 2014-04-17 ENCOUNTER — Other Ambulatory Visit: Payer: Self-pay | Admitting: Dermatology

## 2014-04-17 DIAGNOSIS — C44629 Squamous cell carcinoma of skin of left upper limb, including shoulder: Secondary | ICD-10-CM | POA: Diagnosis not present

## 2014-04-17 DIAGNOSIS — C44619 Basal cell carcinoma of skin of left upper limb, including shoulder: Secondary | ICD-10-CM | POA: Diagnosis not present

## 2014-04-17 DIAGNOSIS — C4442 Squamous cell carcinoma of skin of scalp and neck: Secondary | ICD-10-CM | POA: Diagnosis not present

## 2014-04-17 DIAGNOSIS — C44621 Squamous cell carcinoma of skin of unspecified upper limb, including shoulder: Secondary | ICD-10-CM | POA: Diagnosis not present

## 2014-06-09 ENCOUNTER — Encounter: Payer: Self-pay | Admitting: Cardiovascular Disease

## 2014-06-09 ENCOUNTER — Ambulatory Visit (INDEPENDENT_AMBULATORY_CARE_PROVIDER_SITE_OTHER): Payer: Medicare Other | Admitting: Cardiovascular Disease

## 2014-06-09 VITALS — BP 126/60 | HR 56 | Ht 65.0 in | Wt 162.8 lb

## 2014-06-09 DIAGNOSIS — E785 Hyperlipidemia, unspecified: Secondary | ICD-10-CM

## 2014-06-09 DIAGNOSIS — I10 Essential (primary) hypertension: Secondary | ICD-10-CM

## 2014-06-09 DIAGNOSIS — I255 Ischemic cardiomyopathy: Secondary | ICD-10-CM | POA: Diagnosis not present

## 2014-06-09 DIAGNOSIS — I25810 Atherosclerosis of coronary artery bypass graft(s) without angina pectoris: Secondary | ICD-10-CM

## 2014-06-09 NOTE — Patient Instructions (Signed)
Your physician wants you to follow-up in:  6 months. You will receive a reminder letter in the mail two months in advance. If you don't receive a letter, please call our office to schedule the follow-up appointment.   

## 2014-06-09 NOTE — Progress Notes (Signed)
History of Present Illness: 81 yo male with history of CAD s/p CABG in 1989 and redo bypass in 1997 per Dr. Redmond Pulling, nephrolithiasis, HTN, HLD, mild dementia, prostate cancer here today for cardiac follow up. He has been followed in the past by Dr. Doreatha Lew. Leane Call May 2013 because of dyspnea and fatigue. This showed inferior scar with soft tissue attenuation and small region of anterior scar with possible small area of ischemia. This was felt to be low risk. He has been managed conservatively. He was seen in November 2014 and c/o fatigue and was hypotensive. Lisinopril was stopped and this resolved. Admitted to [facility] June 2015 with chest pain. Cardiac cath 08/28/13 with 95% left main stenosis, occluded LAD, occluded Circumflex, occluded RCA. Patent LIMA to LAD, patent SVG to OM supplying collaterals to RCA. Occluded SVG to RCA and SVG to intermediate branch. Medical therapy recommended. LVEF=35-40% by LVgram. His pain resolved with addition of Zantac, felt to be GI related. Hypotensive so Imdur dose lowered last visit. He was admitted to [facility] October 2015 with dilatation of urethral stricture and lithotripsy of bladder stones and readmitted with UTI and confusion.   He is here today for follow up. He has no exertional chest pain or pressure. He has had weakness since October when he had his UTI. Still having dysuria and frequent urination. Following with urology. No SOB or  dizziness.   Primary Care Physician: Dr. Burnice Logan  Last Lipid Profile:Lipid Panel     Component Value Date/Time   CHOL 126 08/28/2013 0020   TRIG 34 08/28/2013 0020   HDL 54 08/28/2013 0020   CHOLHDL 2.3 08/28/2013 0020   VLDL 7 08/28/2013 0020   LDLCALC 65 08/28/2013 0020    Past Medical History  Diagnosis Date  . Hyperlipidemia   . S/P CABG (coronary artery bypass graft)     [place]  . History of Bell's palsy     RIGHT SIDE-- NO RESIDUAL  . Gross hematuria   . Urethral stricture   . OAB  (overactive bladder)   . Chronic cystitis   . Ischemic cardiomyopathy     ef 35-40% per cath 08-28-2013  . Coronary artery disease CARDIOLOGIST-  DR Grace Bushy  MI -- S/P  11/89 CABG x6 (70% circ; 90% PD; 60-70% distal left main; 30% left circ; 90% 1st diag;, 2nd diag and 3rd diag. w/90%,  mild stenosis LAD and 60-70% pLAD)  Re-do CABG x5 in 1997  . History of nonmelanoma skin cancer     excision's from scalp  . Wears hearing aid     bilateral  . Wears partial dentures   . At risk for sleep apnea     STOP-BANG= 4    SENT TO PCP 12-23-2013  . GERD (gastroesophageal reflux disease)   . Mild dementia   . Kidney stones "years ago"    "passed them"  . Hypertension     "not anymore" (01/02/2014)  . Myocardial infarction 1986; 1997  . Arthritis     "joints ache" (01/02/2014)  . Prostate cancer 1998    S/P  RADIACTIVE SEED IMPLANTS; UROLOGIST-  DR GRAPEY  . Melanoma of ear     "right"    Past Surgical History  Procedure Laterality Date  . Eus  10/05/2011    Procedure: ESOPHAGEAL ENDOSCOPIC ULTRASOUND (EUS) RADIAL;  Surgeon: Arta Silence, MD;  Location: [facility];  Service: Endoscopy;  Laterality: N/A;  . Tonsillectomy  and adenoidectomy  1954  . Laparoscopic cholecystectomy  12-23-2005  . Radioactive prostate seed implants  1998  . Coronary artery bypass graft  11/ 1989  &  10/ 1997    1989-- 6 vessel/  1997 Re-do 5 vessel  . Cardiovascular stress test  08-01-2011  dr Angelena Form    inferior scar and possible soft tissue attenuation with minimal peri-infarct ischemia, small region of anterior ischemia and scar/  LVEF 53% LV wall motion with inferior hypokinesis/ no significant change from scan july 2011  . Cataract extraction w/ intraocular lens  implant, bilateral Bilateral 2007  . Cystoscopy with urethral dilatation N/A 12/25/2013    Procedure: CYSTOSCOPY WITH URETHRAL DILATATION, WITH BIOPSY;  Surgeon: Bernestine Amass, MD;  Location: [facility];  Service: Urology;   Laterality: N/A;  . Melanoma excision  X 1    "ear"  . Cardiac catheterization  02-22-2005  dr Vidal Schwalbe    mild to moderate lv dysfunction with inferobasilar akinesis/  totally occluded SVG to Intermediate Diagonal and SVG to PDA and RCA branches, totally occluded native coronary circulation with diffuse disease pLAD diagonal system with potentially could be ischemic/  patent SVG to OM with collaterals to dRCA and patent LIMA to LAD and diagonal systemss  . Cardiac catheterization  08-28-2013  DR Daneen Schick    widely patent sequential left internal graft to the diagonal/LAD, widely patent SVG to OM with proximal 50% narrowing noted in the graft, total occlusion SVG's to RCA, RI, and the Diagonal/ LV dysfunction with inferobasal aneurysm and mid anterior wall region of akinesis/  overall EF 35-40%/  Total occlusion of the navtive circulation/  no significant change compared to 2006 cath  . Skin cancer excision  X 2    "top of head"  . Left heart catheterization with coronary/graft angiogram N/A 08/28/2013    Procedure: LEFT HEART CATHETERIZATION WITH Beatrix Fetters;  Surgeon: Sinclair Grooms, MD;  Location: [facility] CATH LAB;  Service: Cardiovascular;  Laterality: N/A;    Current Outpatient Prescriptions  Medication Sig Dispense Refill  . acetaminophen (TYLENOL) 500 MG tablet Take 500 mg by mouth every 6 (six) hours as needed for pain or fever. Take 1 -2 pills    . aspirin EC 81 MG EC tablet Take 1 tablet (81 mg total) by mouth daily. 30 tablet 0  . atorvastatin (LIPITOR) 80 MG tablet Take 40 mg by mouth every evening.    . Calcium Carbonate-Vitamin D (CALCIUM + D PO) Take 1 tablet by mouth 2 (two) times daily.     . Cholecalciferol (VITAMIN D) 2000 UNITS tablet Take 2,000 Units by mouth daily.    Marland Kitchen galantamine (RAZADYNE ER) 8 MG 24 hr capsule Take 8 mg by mouth daily with breakfast.    . HYDROcodone-acetaminophen (NORCO/VICODIN) 5-325 MG per tablet Take 1 tablet by mouth every 6 (six) hours  as needed for moderate pain.    . hydroxypropyl methylcellulose (ISOPTO TEARS) 2.5 % ophthalmic solution Place 1 drop into both eyes 2 (two) times daily.     Marland Kitchen ibuprofen (ADVIL,MOTRIN) 200 MG tablet Take 200 mg by mouth every 6 (six) hours as needed.    . isosorbide mononitrate (IMDUR) 60 MG 24 hr tablet Take 0.5 tablets (30 mg total) by mouth every morning.    . Multiple Vitamin (MULTIVITAMIN WITH MINERALS) TABS Take 1 tablet by mouth daily.    . nitroGLYCERIN (NITROSTAT) 0.4 MG SL tablet Place 0.4 mg under the tongue every 5 (five) minutes  as needed for chest pain.     . ranitidine (ZANTAC) 150 MG tablet Take 150 mg by mouth as needed for heartburn.    . silodosin (RAPAFLO) 8 MG CAPS capsule Take 8 mg by mouth every evening.     Marland Kitchen URELLE (URELLE/URISED) 81 MG TABS tablet Take 1 tablet (81 mg total) by mouth every 6 (six) hours as needed for bladder spasms. 120 each 0   No current facility-administered medications for this visit.    Allergies  Allergen Reactions  . Pantoprazole Other (See Comments)    Headache and lightheaded  . Ciprofloxacin Swelling and Hives    Allergy classified as swelling here, but pt says Cipro just doesn't work for him    . Nitrofurantoin Hives  . Sulfa Antibiotics Hives and Itching    History   Social History  . Marital Status: Married    Spouse Name: N/A  . Number of Children: N/A  . Years of Education: N/A   Occupational History  . Not on file.   Social History Main Topics  . Smoking status: Former Smoker -- 40 years    Types: Cigars    Quit date: 12/24/1983  . Smokeless tobacco: Former Systems developer    Types: Chew    Quit date: 08/28/2013  . Alcohol Use: No  . Drug Use: No  . Sexual Activity: Not on file   Other Topics Concern  . Not on file   Social History Narrative    Family History  Problem Relation Age of Onset  . Heart disease Mother   . Diabetes Father   . Heart disease Brother     Review of Systems:  As stated in the HPI and  otherwise negative.   BP 126/60 mmHg  Pulse 56  Ht 5\' 5"  (1.651 m)  Wt 162 lb 12.8 oz (73.846 kg)  BMI 27.09 kg/m2  Physical Examination: General: Well developed, well nourished, NAD HEENT: OP clear, mucus membranes moist SKIN: warm, dry. No rashes. Neuro: No focal deficits Musculoskeletal: Muscle strength 5/5 all ext Psychiatric: Mood and affect normal Neck: No JVD, no carotid bruits, no thyromegaly, no lymphadenopathy. Lungs:Clear bilaterally, no wheezes, rhonci, crackles Cardiovascular: Regular rate and rhythm. No murmurs, gallops or rubs. Abdomen:Soft. Bowel sounds present. Non-tender.  Extremities: No lower extremity edema. Pulses are 2 + in the bilateral DP/PT.  Cardiac cath 08/28/13: The left main coronary artery is is severely involved distally with a string-like obstruction of at least 95%. This vessel is heavily calcified..  The left anterior descending artery is totally occluded..  The left circumflex artery is totally occluded.  The right coronary artery is totally occluded proximally. Right to right homocollaterals and noted to feel a right ventricular branch.Marland Kitchen  BYPASS GRAFT ANGIOGRAPHY: The saphenous vein graft to the obtuse marginal is widely patent there is proximal segmental 30-50% narrowing. The obtuse marginal territory supplies collaterals to the distal right coronary.  The saphenous vein graft to the right coronary is totally occluded  The saphenous vein graft to the ramus/diagonal is totally occluded  The left internal mammary graft to the LAD and diagonal is widely patent  LEFT VENTRICULOGRAM: Left ventricular angiogram was done in the 30 RAO projection and revealed a well formed inferoposterior aneurysm. The LV cavity is dilated. A focal portion of the mid anterior wall is akinetic. Overall LV function reveals an EF of 35-40%. No obvious mitral regurgitation is noted although power injection was not performed and mild regurgitation could be missed.  IMPRESSIONS:  1.  Widely patent sequential left internal graft to the diagonal/LAD.  2. Widely patent saphenous vein graft to the obtuse marginal with proximal 50% narrowing noted in the graft.  3. Total occlusion of the saphenous vein grafts to the right coronary, the ramus intermedius, and the diagonal.  4. Left ventricular dysfunction with inferobasal aneurysm and mid anterior wall region of akinesis. Overall EF is 35-40% .  5. Total occlusion of the native circulation.   Assessment and Plan:   1. CORONARY ARTERY DISEASE: Stable. Cardiac cath June 2015 with stable anatomy. Continue statin, ASA. Imdur. No beta blocker secondary to bradycardia. No Ace-inh secondary to hypotension.   2. Hyperlipidemia: Continue statin. Lipids well controlled.   3. HTN: BP well controlled. No changes today.   4. Ischemic cardiomyopathy: LVEF=35-40% by LV gram. He does not tolerate Ace-inh or beta blockers.   5. GERD: He uses Zantac prn.

## 2014-06-30 ENCOUNTER — Encounter: Payer: Self-pay | Admitting: Internal Medicine

## 2014-06-30 ENCOUNTER — Ambulatory Visit (INDEPENDENT_AMBULATORY_CARE_PROVIDER_SITE_OTHER): Payer: Medicare Other | Admitting: Internal Medicine

## 2014-06-30 VITALS — BP 130/70 | HR 64 | Temp 97.5°F | Wt 165.0 lb

## 2014-06-30 DIAGNOSIS — R350 Frequency of micturition: Secondary | ICD-10-CM | POA: Diagnosis not present

## 2014-06-30 DIAGNOSIS — R2681 Unsteadiness on feet: Secondary | ICD-10-CM

## 2014-06-30 DIAGNOSIS — I255 Ischemic cardiomyopathy: Secondary | ICD-10-CM

## 2014-06-30 DIAGNOSIS — N481 Balanitis: Secondary | ICD-10-CM | POA: Diagnosis not present

## 2014-06-30 DIAGNOSIS — N39498 Other specified urinary incontinence: Secondary | ICD-10-CM | POA: Diagnosis not present

## 2014-06-30 LAB — CBC WITH DIFFERENTIAL/PLATELET
Basophils Absolute: 0 10*3/uL (ref 0.0–0.1)
Basophils Relative: 0.4 % (ref 0.0–3.0)
Eosinophils Absolute: 0.1 10*3/uL (ref 0.0–0.7)
Eosinophils Relative: 1.6 % (ref 0.0–5.0)
HCT: 42 % (ref 39.0–52.0)
Hemoglobin: 14.3 g/dL (ref 13.0–17.0)
Lymphocytes Relative: 14.9 % (ref 12.0–46.0)
Lymphs Abs: 0.9 10*3/uL (ref 0.7–4.0)
MCHC: 34 g/dL (ref 30.0–36.0)
MCV: 89.4 fl (ref 78.0–100.0)
Monocytes Absolute: 0.8 10*3/uL (ref 0.1–1.0)
Monocytes Relative: 13.4 % — ABNORMAL HIGH (ref 3.0–12.0)
Neutro Abs: 4.1 10*3/uL (ref 1.4–7.7)
Neutrophils Relative %: 69.7 % (ref 43.0–77.0)
Platelets: 111 10*3/uL — ABNORMAL LOW (ref 150.0–400.0)
RBC: 4.69 Mil/uL (ref 4.22–5.81)
RDW: 14.9 % (ref 11.5–15.5)
WBC: 5.9 10*3/uL (ref 4.0–10.5)

## 2014-06-30 LAB — BASIC METABOLIC PANEL
BUN: 24 mg/dL — ABNORMAL HIGH (ref 6–23)
CO2: 27 mEq/L (ref 19–32)
Calcium: 9.3 mg/dL (ref 8.4–10.5)
Chloride: 105 mEq/L (ref 96–112)
Creatinine, Ser: 1.07 mg/dL (ref 0.40–1.50)
GFR: 69.84 mL/min (ref >60.00)
Glucose, Bld: 109 mg/dL — ABNORMAL HIGH (ref 70–99)
Potassium: 4 mEq/L (ref 3.5–5.1)
Sodium: 137 mEq/L (ref 135–145)

## 2014-06-30 LAB — POCT URINALYSIS DIPSTICK
Bilirubin, UA: NEGATIVE
Blood, UA: NEGATIVE
Glucose, UA: NEGATIVE
Ketones, UA: NEGATIVE
Nitrite, UA: NEGATIVE
Protein, UA: NEGATIVE
Spec Grav, UA: 1.015
Urobilinogen, UA: 0.2
pH, UA: 6.5

## 2014-06-30 MED ORDER — NYSTATIN 100000 UNIT/GM EX CREA: 1.0000 "application " | TOPICAL_CREAM | Freq: Two times a day (BID) | CUTANEOUS | Status: DC

## 2014-06-30 NOTE — Assessment & Plan Note (Signed)
81 year old white male with history of urinary incontinence and enterococcus bacteremia presents with urinary frequency. On exam he has irritated erythematous meatus and phimosis.  Empiric treatment with topical antifungal cream. Refer to urology for follow-up. Obtain urine culture. UA only showed trace leukocytes.  Obtain CBCD and BMET.  Patient has history of urinary incontinence, memory loss and gait instability. This may be suggestive of normal pressure hydrocephalus. Obtain MRI of brain.

## 2014-06-30 NOTE — Progress Notes (Signed)
Subjective:    Patient ID: Ronald [name], male    DOB: April 02, 1927, 81 y.o.   MRN: [id number]  HPI  81 year old white male with history of coronary artery disease, chronic urinary incontinence And gait instability presents with urinary frequency. Patient reports she has been evaluated by urologist in the past. He was diagnosed with spastic bladder. Patient also has history of enterococcus sepsis.  In addition to urinary frequency patient reports irritation of the tip of his penis.  He wears pads for urinary incontinence.  Patient's previous urologic workup included CT of abdomen and pelvis. On previous CT there was mention of pancreatic abnormality. He reports he was referred to Dr. Paulita Fujita by his urologist. MRI of abdomen performed. Patient scheduled to follow-up in 1 year for repeat MRI.  Review of Systems Negative for fever or chills, urinary frequency, slight odor to his urine    Past Medical History  Diagnosis Date  . Hyperlipidemia   . S/P CABG (coronary artery bypass graft)     [place]  . History of Bell's palsy     RIGHT SIDE-- NO RESIDUAL  . Gross hematuria   . Urethral stricture   . OAB (overactive bladder)   . Chronic cystitis   . Ischemic cardiomyopathy     ef 35-40% per cath 08-28-2013  . Coronary artery disease CARDIOLOGIST-  DR Grace Bushy  MI -- S/P  11/89 CABG x6 (70% circ; 90% PD; 60-70% distal left main; 30% left circ; 90% 1st diag;, 2nd diag and 3rd diag. w/90%,  mild stenosis LAD and 60-70% pLAD)  Re-do CABG x5 in 1997  . History of nonmelanoma skin cancer     excision's from scalp  . Wears hearing aid     bilateral  . Wears partial dentures   . At risk for sleep apnea     STOP-BANG= 4    SENT TO PCP 12-23-2013  . GERD (gastroesophageal reflux disease)   . Mild dementia   . Kidney stones "years ago"    "passed them"  . Hypertension     "not anymore" (01/02/2014)  . Myocardial infarction 1986; 1997  . Arthritis     "joints ache"  (01/02/2014)  . Prostate cancer 1998    S/P  RADIACTIVE SEED IMPLANTS; UROLOGIST-  DR GRAPEY  . Melanoma of ear     "right"    History   Social History  . Marital Status: Married    Spouse Name: N/A  . Number of Children: N/A  . Years of Education: N/A   Occupational History  . Not on file.   Social History Main Topics  . Smoking status: Former Smoker -- 40 years    Types: Cigars    Quit date: 12/24/1983  . Smokeless tobacco: Former Systems developer    Types: Chew    Quit date: 08/28/2013  . Alcohol Use: No  . Drug Use: No  . Sexual Activity: Not on file   Other Topics Concern  . Not on file   Social History Narrative    Past Surgical History  Procedure Laterality Date  . Eus  10/05/2011    Procedure: ESOPHAGEAL ENDOSCOPIC ULTRASOUND (EUS) RADIAL;  Surgeon: Arta Silence, MD;  Location: [facility];  Service: Endoscopy;  Laterality: N/A;  . Tonsillectomy and adenoidectomy  1954  . Laparoscopic cholecystectomy  12-23-2005  . Radioactive prostate seed implants  1998  . Coronary artery bypass graft  11/ 1989  &  [place]-- 6 vessel/  1997 Re-do 5 vessel  . Cardiovascular stress test  08-01-2011  dr Angelena Form    inferior scar and possible soft tissue attenuation with minimal peri-infarct ischemia, small region of anterior ischemia and scar/  LVEF 53% LV wall motion with inferior hypokinesis/ no significant change from scan july 2011  . Cataract extraction w/ intraocular lens  implant, bilateral Bilateral 2007  . Cystoscopy with urethral dilatation N/A 12/25/2013    Procedure: CYSTOSCOPY WITH URETHRAL DILATATION, WITH BIOPSY;  Surgeon: Bernestine Amass, MD;  Location: [facility];  Service: Urology;  Laterality: N/A;  . Melanoma excision  X 1    "ear"  . Cardiac catheterization  02-22-2005  dr Vidal Schwalbe    mild to moderate lv dysfunction with inferobasilar akinesis/  totally occluded SVG to Intermediate Diagonal and SVG to PDA and RCA branches, totally occluded  native coronary circulation with diffuse disease pLAD diagonal system with potentially could be ischemic/  patent SVG to OM with collaterals to dRCA and patent LIMA to LAD and diagonal systemss  . Cardiac catheterization  08-28-2013  DR Daneen Schick    widely patent sequential left internal graft to the diagonal/LAD, widely patent SVG to OM with proximal 50% narrowing noted in the graft, total occlusion SVG's to RCA, RI, and the Diagonal/ LV dysfunction with inferobasal aneurysm and mid anterior wall region of akinesis/  overall EF 35-40%/  Total occlusion of the navtive circulation/  no significant change compared to 2006 cath  . Skin cancer excision  X 2    "top of head"  . Left heart catheterization with coronary/graft angiogram N/A 08/28/2013    Procedure: LEFT HEART CATHETERIZATION WITH Beatrix Fetters;  Surgeon: Sinclair Grooms, MD;  Location: [facility] CATH LAB;  Service: Cardiovascular;  Laterality: N/A;    Family History  Problem Relation Age of Onset  . Heart disease Mother   . Diabetes Father   . Heart disease Brother     Allergies  Allergen Reactions  . Pantoprazole Other (See Comments)    Headache and lightheaded  . Ciprofloxacin Swelling and Hives    Allergy classified as swelling here, but pt says Cipro just doesn't work for him    . Nitrofurantoin Hives  . Sulfa Antibiotics Hives and Itching    Current Outpatient Prescriptions on File Prior to Visit  Medication Sig Dispense Refill  . acetaminophen (TYLENOL) 500 MG tablet Take 500 mg by mouth every 6 (six) hours as needed for pain or fever. Take 1 -2 pills    . aspirin EC 81 MG EC tablet Take 1 tablet (81 mg total) by mouth daily. 30 tablet 0  . atorvastatin (LIPITOR) 80 MG tablet Take 40 mg by mouth every evening.    . Calcium Carbonate-Vitamin D (CALCIUM + D PO) Take 1 tablet by mouth 2 (two) times daily.     . Cholecalciferol (VITAMIN D) 2000 UNITS tablet Take 2,000 Units by mouth daily.    Marland Kitchen galantamine (RAZADYNE  ER) 8 MG 24 hr capsule Take 8 mg by mouth daily with breakfast.    . HYDROcodone-acetaminophen (NORCO/VICODIN) 5-325 MG per tablet Take 1 tablet by mouth every 6 (six) hours as needed for moderate pain.    . hydroxypropyl methylcellulose (ISOPTO TEARS) 2.5 % ophthalmic solution Place 1 drop into both eyes 2 (two) times daily.     Marland Kitchen ibuprofen (ADVIL,MOTRIN) 200 MG tablet Take 200 mg by mouth every 6 (six) hours as needed.    Marland Kitchen  isosorbide mononitrate (IMDUR) 60 MG 24 hr tablet Take 0.5 tablets (30 mg total) by mouth every morning.    . Multiple Vitamin (MULTIVITAMIN WITH MINERALS) TABS Take 1 tablet by mouth daily.    . nitroGLYCERIN (NITROSTAT) 0.4 MG SL tablet Place 0.4 mg under the tongue every 5 (five) minutes as needed for chest pain.     . ranitidine (ZANTAC) 150 MG tablet Take 150 mg by mouth as needed for heartburn.    . silodosin (RAPAFLO) 8 MG CAPS capsule Take 8 mg by mouth every evening.     Marland Kitchen URELLE (URELLE/URISED) 81 MG TABS tablet Take 1 tablet (81 mg total) by mouth every 6 (six) hours as needed for bladder spasms. 120 each 0   No current facility-administered medications on file prior to visit.    BP 130/70 mmHg  Pulse 64  Temp(Src) 97.5 F (36.4 C) (Oral)  Wt 165 lb (74.844 kg)    Objective:   Physical Exam  Constitutional: He is oriented to person, place, and time. He appears well-developed and well-nourished. No distress.  HENT:  Head: Normocephalic and atraumatic.  Right Ear: External ear normal.  Left Ear: External ear normal.  Mouth/Throat: Oropharynx is clear and moist.  Cardiovascular: Normal rate, regular rhythm and normal heart sounds.  Exam reveals no gallop.   No murmur heard. Pulmonary/Chest: Effort normal and breath sounds normal. He has no wheezes.  Abdominal: Soft. Bowel sounds are normal. He exhibits no mass. There is no tenderness.  No CVA tenderness  Genitourinary:  Uncircumcised, meatus red and irritated.  Unable to retract foreskin    Neurological: He is alert and oriented to person, place, and time. No cranial nerve deficit.  Wide based gait, ambulates with single prong cane  Skin: Skin is warm and dry.  Psychiatric: He has a normal mood and affect. His behavior is normal.          Assessment & Plan:

## 2014-06-30 NOTE — Progress Notes (Signed)
Pre visit review using our clinic review tool, if applicable. No additional management support is needed unless otherwise documented below in the visit note. 

## 2014-06-30 NOTE — Patient Instructions (Signed)
Our office will contact you re: blood and urine tests Please follow up with your urologist re: balanitis

## 2014-07-01 ENCOUNTER — Telehealth: Payer: Self-pay | Admitting: Internal Medicine

## 2014-07-01 NOTE — Telephone Encounter (Signed)
See result note.  

## 2014-07-01 NOTE — Telephone Encounter (Signed)
Pt returned your call and would like a call back .. °

## 2014-07-02 LAB — URINE CULTURE: Colony Count: 10000

## 2014-07-16 DIAGNOSIS — L570 Actinic keratosis: Secondary | ICD-10-CM | POA: Diagnosis not present

## 2014-07-18 ENCOUNTER — Ambulatory Visit
Admission: RE | Admit: 2014-07-18 | Discharge: 2014-07-18 | Disposition: A | Discharge status: Home / Self Care | Payer: Medicare Other | Source: Ambulatory Visit | Attending: Internal Medicine | Admitting: Internal Medicine

## 2014-07-18 DIAGNOSIS — R2689 Other abnormalities of gait and mobility: Secondary | ICD-10-CM | POA: Diagnosis not present

## 2014-07-18 DIAGNOSIS — J320 Chronic maxillary sinusitis: Secondary | ICD-10-CM | POA: Diagnosis not present

## 2014-07-18 DIAGNOSIS — R42 Dizziness and giddiness: Secondary | ICD-10-CM | POA: Diagnosis not present

## 2014-07-18 DIAGNOSIS — R2681 Unsteadiness on feet: Secondary | ICD-10-CM

## 2014-07-18 DIAGNOSIS — N39498 Other specified urinary incontinence: Secondary | ICD-10-CM

## 2014-07-18 DIAGNOSIS — R413 Other amnesia: Secondary | ICD-10-CM | POA: Diagnosis not present

## 2014-07-29 ENCOUNTER — Ambulatory Visit: Payer: Medicare Other | Admitting: Internal Medicine

## 2014-09-02 DIAGNOSIS — Z8546 Personal history of malignant neoplasm of prostate: Secondary | ICD-10-CM | POA: Diagnosis not present

## 2014-09-02 DIAGNOSIS — N3281 Overactive bladder: Secondary | ICD-10-CM | POA: Diagnosis not present

## 2014-09-02 DIAGNOSIS — N359 Urethral stricture, unspecified: Secondary | ICD-10-CM | POA: Diagnosis not present

## 2014-09-09 ENCOUNTER — Encounter (HOSPITAL_COMMUNITY): Payer: Self-pay

## 2014-09-09 ENCOUNTER — Inpatient Hospital Stay (HOSPITAL_COMMUNITY)
Admission: EM | Admit: 2014-09-09 | Discharge: 2014-09-12 | DRG: 690 | Disposition: A | Discharge status: Home / Self Care | Payer: Medicare Other | Attending: Internal Medicine | Admitting: Internal Medicine

## 2014-09-09 DIAGNOSIS — I255 Ischemic cardiomyopathy: Secondary | ICD-10-CM | POA: Diagnosis present

## 2014-09-09 DIAGNOSIS — R531 Weakness: Secondary | ICD-10-CM | POA: Diagnosis not present

## 2014-09-09 DIAGNOSIS — Z882 Allergy status to sulfonamides status: Secondary | ICD-10-CM

## 2014-09-09 DIAGNOSIS — Z951 Presence of aortocoronary bypass graft: Secondary | ICD-10-CM

## 2014-09-09 DIAGNOSIS — N3020 Other chronic cystitis without hematuria: Secondary | ICD-10-CM | POA: Diagnosis present

## 2014-09-09 DIAGNOSIS — N3289 Other specified disorders of bladder: Secondary | ICD-10-CM | POA: Diagnosis present

## 2014-09-09 DIAGNOSIS — E785 Hyperlipidemia, unspecified: Secondary | ICD-10-CM | POA: Diagnosis present

## 2014-09-09 DIAGNOSIS — I5022 Chronic systolic (congestive) heart failure: Secondary | ICD-10-CM | POA: Diagnosis present

## 2014-09-09 DIAGNOSIS — N3281 Overactive bladder: Secondary | ICD-10-CM | POA: Diagnosis present

## 2014-09-09 DIAGNOSIS — Z8546 Personal history of malignant neoplasm of prostate: Secondary | ICD-10-CM

## 2014-09-09 DIAGNOSIS — Z9841 Cataract extraction status, right eye: Secondary | ICD-10-CM

## 2014-09-09 DIAGNOSIS — Z881 Allergy status to other antibiotic agents status: Secondary | ICD-10-CM

## 2014-09-09 DIAGNOSIS — N400 Enlarged prostate without lower urinary tract symptoms: Secondary | ICD-10-CM | POA: Diagnosis present

## 2014-09-09 DIAGNOSIS — F17220 Nicotine dependence, chewing tobacco, uncomplicated: Secondary | ICD-10-CM | POA: Diagnosis present

## 2014-09-09 DIAGNOSIS — I2510 Atherosclerotic heart disease of native coronary artery without angina pectoris: Secondary | ICD-10-CM | POA: Diagnosis present

## 2014-09-09 DIAGNOSIS — Z9842 Cataract extraction status, left eye: Secondary | ICD-10-CM

## 2014-09-09 DIAGNOSIS — Z87891 Personal history of nicotine dependence: Secondary | ICD-10-CM

## 2014-09-09 DIAGNOSIS — F0390 Unspecified dementia without behavioral disturbance: Secondary | ICD-10-CM | POA: Diagnosis not present

## 2014-09-09 DIAGNOSIS — Y92009 Unspecified place in unspecified non-institutional (private) residence as the place of occurrence of the external cause: Secondary | ICD-10-CM

## 2014-09-09 DIAGNOSIS — N390 Urinary tract infection, site not specified: Principal | ICD-10-CM | POA: Diagnosis present

## 2014-09-09 DIAGNOSIS — K219 Gastro-esophageal reflux disease without esophagitis: Secondary | ICD-10-CM | POA: Diagnosis present

## 2014-09-09 DIAGNOSIS — N3000 Acute cystitis without hematuria: Secondary | ICD-10-CM | POA: Diagnosis not present

## 2014-09-09 DIAGNOSIS — R7881 Bacteremia: Secondary | ICD-10-CM | POA: Diagnosis not present

## 2014-09-09 DIAGNOSIS — B9620 Unspecified Escherichia coli [E. coli] as the cause of diseases classified elsewhere: Secondary | ICD-10-CM | POA: Diagnosis present

## 2014-09-09 DIAGNOSIS — N359 Urethral stricture, unspecified: Secondary | ICD-10-CM | POA: Diagnosis present

## 2014-09-09 DIAGNOSIS — Z9049 Acquired absence of other specified parts of digestive tract: Secondary | ICD-10-CM | POA: Diagnosis present

## 2014-09-09 DIAGNOSIS — Z85820 Personal history of malignant melanoma of skin: Secondary | ICD-10-CM

## 2014-09-09 DIAGNOSIS — Z961 Presence of intraocular lens: Secondary | ICD-10-CM | POA: Diagnosis present

## 2014-09-09 DIAGNOSIS — Z7982 Long term (current) use of aspirin: Secondary | ICD-10-CM

## 2014-09-09 DIAGNOSIS — R509 Fever, unspecified: Secondary | ICD-10-CM | POA: Diagnosis present

## 2014-09-09 DIAGNOSIS — W19XXXA Unspecified fall, initial encounter: Secondary | ICD-10-CM

## 2014-09-09 DIAGNOSIS — M1990 Unspecified osteoarthritis, unspecified site: Secondary | ICD-10-CM | POA: Diagnosis present

## 2014-09-09 DIAGNOSIS — I10 Essential (primary) hypertension: Secondary | ICD-10-CM | POA: Diagnosis present

## 2014-09-09 DIAGNOSIS — I252 Old myocardial infarction: Secondary | ICD-10-CM

## 2014-09-09 DIAGNOSIS — Z87442 Personal history of urinary calculi: Secondary | ICD-10-CM

## 2014-09-09 LAB — CBC
HCT: 42.7 % (ref 39.0–52.0)
Hemoglobin: 14.4 g/dL (ref 13.0–17.0)
MCH: 31 pg (ref 26.0–34.0)
MCHC: 33.7 g/dL (ref 30.0–36.0)
MCV: 92 fL (ref 78.0–100.0)
Platelets: 109 10*3/uL — ABNORMAL LOW (ref 150–400)
RBC: 4.64 MIL/uL (ref 4.22–5.81)
RDW: 13.7 % (ref 11.5–15.5)
WBC: 10.2 10*3/uL (ref 4.0–10.5)

## 2014-09-09 LAB — I-STAT CHEM 8, ED
BUN: 25 mg/dL — ABNORMAL HIGH (ref 6–20)
Calcium, Ion: 1.2 mmol/L (ref 1.13–1.30)
Chloride: 105 mmol/L (ref 101–111)
Creatinine, Ser: 1.1 mg/dL (ref 0.61–1.24)
Glucose, Bld: 141 mg/dL — ABNORMAL HIGH (ref 65–99)
HCT: 45 % (ref 39.0–52.0)
Hemoglobin: 15.3 g/dL (ref 13.0–17.0)
Potassium: 4.3 mmol/L (ref 3.5–5.1)
Sodium: 138 mmol/L (ref 135–145)
TCO2: 20 mmol/L (ref 0–100)

## 2014-09-09 LAB — URINE MICROSCOPIC-ADD ON

## 2014-09-09 LAB — URINALYSIS, ROUTINE W REFLEX MICROSCOPIC
Bilirubin Urine: NEGATIVE
Glucose, UA: NEGATIVE mg/dL
Ketones, ur: 15 mg/dL — AB
Nitrite: POSITIVE — AB
Protein, ur: 100 mg/dL — AB
Specific Gravity, Urine: 1.014 (ref 1.005–1.030)
Urobilinogen, UA: 0.2 mg/dL (ref 0.0–1.0)
pH: 5 (ref 5.0–8.0)

## 2014-09-09 NOTE — ED Provider Notes (Signed)
CSN: [id number]     Arrival date & time 09/09/14  [id number] History  This chart was scribed for Linton Flemings, MD by Rayfield Citizen, ED Scribe. This patient was seen in room B14C/B14C and the patient's care was started at 11:53 PM.     Chief Complaint  Patient presents with  . Weakness   The history is provided by the patient and the spouse. No language interpreter was used.     HPI Comments: Ronald [name] is a 81 y.o. male with past medical history of prostate cancer (radioactive seed implants under Dr. Risa Grill in 1998) who presents to the Emergency Department complaining of 2 days of intermittent weakness and dizziness. Patient explains he has had a fever (TMAX of 102) and chills throughout the day today. He states he described these symptoms to his PCP who suggested he come to the ED for further evaluation at this time. Per records, patient was seen at the urologist last week for a check up and his urine was negative for infection at that time. Patient does note that he has "always had" painful urination and bladder spasms, but recently his pain has worsened. He denies nausea, vomiting, appetite decrease.   Patient also had a fall to his knees in the bathroom this evening around 17:00; per nurse's note, patient hit his forehead on the door but denies LOC. He denies any other injuries.   Patient's wife explains that patient has frequent UTIs and had "two episodes of infection to his blood, one was E. coli," last episode in Aug/Sept 2015. She states his current symptoms are similar to prior UTIs.   PCP Dr. Burnice Logan  Past Medical History  Diagnosis Date  . Hyperlipidemia   . S/P CABG (coronary artery bypass graft)     [place]  . History of Bell's palsy     RIGHT SIDE-- NO RESIDUAL  . Gross hematuria   . Urethral stricture   . OAB (overactive bladder)   . Chronic cystitis   . Ischemic cardiomyopathy     ef 35-40% per cath 08-28-2013  . Coronary artery disease CARDIOLOGIST-  DR  Grace Bushy  MI -- S/P  11/89 CABG x6 (70% circ; 90% PD; 60-70% distal left main; 30% left circ; 90% 1st diag;, 2nd diag and 3rd diag. w/90%,  mild stenosis LAD and 60-70% pLAD)  Re-do CABG x5 in 1997  . History of nonmelanoma skin cancer     excision's from scalp  . Wears hearing aid     bilateral  . Wears partial dentures   . At risk for sleep apnea     STOP-BANG= 4    SENT TO PCP 12-23-2013  . GERD (gastroesophageal reflux disease)   . Mild dementia   . Kidney stones "years ago"    "passed them"  . Hypertension     "not anymore" (01/02/2014)  . Myocardial infarction 1986; 1997  . Arthritis     "joints ache" (01/02/2014)  . Prostate cancer 1998    S/P  RADIACTIVE SEED IMPLANTS; UROLOGIST-  DR GRAPEY  . Melanoma of ear     "right"   Past Surgical History  Procedure Laterality Date  . Eus  10/05/2011    Procedure: ESOPHAGEAL ENDOSCOPIC ULTRASOUND (EUS) RADIAL;  Surgeon: Arta Silence, MD;  Location: [facility];  Service: Endoscopy;  Laterality: N/A;  . Tonsillectomy and adenoidectomy  1954  . Laparoscopic cholecystectomy  12-23-2005  . Radioactive prostate seed implants  1998  . Coronary artery bypass graft  11/ 1989  &  10/ 1997    1989-- 6 vessel/  1997 Re-do 5 vessel  . Cardiovascular stress test  08-01-2011  dr Angelena Form    inferior scar and possible soft tissue attenuation with minimal peri-infarct ischemia, small region of anterior ischemia and scar/  LVEF 53% LV wall motion with inferior hypokinesis/ no significant change from scan july 2011  . Cataract extraction w/ intraocular lens  implant, bilateral Bilateral 2007  . Cystoscopy with urethral dilatation N/A 12/25/2013    Procedure: CYSTOSCOPY WITH URETHRAL DILATATION, WITH BIOPSY;  Surgeon: Bernestine Amass, MD;  Location: [facility];  Service: Urology;  Laterality: N/A;  . Melanoma excision  X 1    "ear"  . Cardiac catheterization  02-22-2005  dr Vidal Schwalbe    mild to moderate lv dysfunction with  inferobasilar akinesis/  totally occluded SVG to Intermediate Diagonal and SVG to PDA and RCA branches, totally occluded native coronary circulation with diffuse disease pLAD diagonal system with potentially could be ischemic/  patent SVG to OM with collaterals to dRCA and patent LIMA to LAD and diagonal systemss  . Cardiac catheterization  08-28-2013  DR Daneen Schick    widely patent sequential left internal graft to the diagonal/LAD, widely patent SVG to OM with proximal 50% narrowing noted in the graft, total occlusion SVG's to RCA, RI, and the Diagonal/ LV dysfunction with inferobasal aneurysm and mid anterior wall region of akinesis/  overall EF 35-40%/  Total occlusion of the navtive circulation/  no significant change compared to 2006 cath  . Skin cancer excision  X 2    "top of head"  . Left heart catheterization with coronary/graft angiogram N/A 08/28/2013    Procedure: LEFT HEART CATHETERIZATION WITH Beatrix Fetters;  Surgeon: Sinclair Grooms, MD;  Location: [facility];  Service: Cardiovascular;  Laterality: N/A;   Family History  Problem Relation Age of Onset  . Heart disease Mother   . Diabetes Father   . Heart disease Brother    History  Substance Use Topics  . Smoking status: Former Smoker -- 40 years    Types: Cigars    Quit date: 12/24/1983  . Smokeless tobacco: Former Systems developer    Types: Chew    Quit date: 08/28/2013  . Alcohol Use: No    Review of Systems  Constitutional: Negative for appetite change.  Gastrointestinal: Negative for nausea and vomiting.  Neurological: Positive for dizziness and weakness. Negative for syncope.  All other systems reviewed and are negative.   Allergies  Pantoprazole; Ciprofloxacin; Nitrofurantoin; and Sulfa antibiotics  Home Medications   Prior to Admission medications   Medication Sig Start Date End Date Taking? Authorizing Provider  acetaminophen (TYLENOL) 500 MG tablet Take 500 mg by mouth every 6 (six) hours as needed for  pain or fever. Take 1 -2 pills    Historical Provider, MD  aspirin EC 81 MG EC tablet Take 1 tablet (81 mg total) by mouth daily. 08/29/13   Brittainy Erie Noe, PA-C  atorvastatin (LIPITOR) 80 MG tablet Take 40 mg by mouth every evening. 09/23/13   Imogene Burn, PA-C  Calcium Carbonate-Vitamin D (CALCIUM + D PO) Take 1 tablet by mouth 2 (two) times daily.     Historical Provider, MD  Cholecalciferol (VITAMIN D) 2000 UNITS tablet Take 2,000 Units by mouth daily.    Historical Provider, MD  galantamine (RAZADYNE ER) 8 MG 24 hr capsule Take 8 mg  by mouth daily with breakfast.    Historical Provider, MD  HYDROcodone-acetaminophen (NORCO/VICODIN) 5-325 MG per tablet Take 1 tablet by mouth every 6 (six) hours as needed for moderate pain.    Historical Provider, MD  hydroxypropyl methylcellulose (ISOPTO TEARS) 2.5 % ophthalmic solution Place 1 drop into both eyes 2 (two) times daily.     Historical Provider, MD  ibuprofen (ADVIL,MOTRIN) 200 MG tablet Take 200 mg by mouth every 6 (six) hours as needed.    Historical Provider, MD  isosorbide mononitrate (IMDUR) 60 MG 24 hr tablet Take 0.5 tablets (30 mg total) by mouth every morning. 01/07/14   Shanker Kristeen Mans, MD  Multiple Vitamin (MULTIVITAMIN WITH MINERALS) TABS Take 1 tablet by mouth daily.    Historical Provider, MD  nitroGLYCERIN (NITROSTAT) 0.4 MG SL tablet Place 0.4 mg under the tongue every 5 (five) minutes as needed for chest pain.     Historical Provider, MD  nystatin cream (MYCOSTATIN) Apply 1 application topically 2 (two) times daily. 06/30/14   Doe-Hyun R Shawna Orleans, DO  ranitidine (ZANTAC) 150 MG tablet Take 150 mg by mouth as needed for heartburn.    Historical Provider, MD  silodosin (RAPAFLO) 8 MG CAPS capsule Take 8 mg by mouth every evening.     Historical Provider, MD  URELLE (URELLE/URISED) 81 MG TABS tablet Take 1 tablet (81 mg total) by mouth every 6 (six) hours as needed for bladder spasms. 01/07/14   Shanker Kristeen Mans, MD   BP 125/66 mmHg   Pulse 62  Temp(Src) 98.3 F (36.8 C) (Oral)  Resp 19  Ht 5\' 5"  (1.651 m)  SpO2 97% Physical Exam  Constitutional: He is oriented to person, place, and time. He appears well-developed and well-nourished. No distress.  HENT:  Head: Normocephalic and atraumatic.  Eyes: EOM are normal. Pupils are equal, round, and reactive to light.  Neck: Normal range of motion. Neck supple. No JVD present.  Cardiovascular: Normal rate, regular rhythm and normal heart sounds.  Exam reveals no gallop and no friction rub.   No murmur heard. Pulmonary/Chest: Effort normal and breath sounds normal. No respiratory distress. He has no wheezes. He has no rales. He exhibits no tenderness.  Abdominal: Soft. Bowel sounds are normal. He exhibits no distension and no mass. There is no tenderness. There is no rebound and no guarding.  Musculoskeletal: Normal range of motion. He exhibits no edema or tenderness.  Moves all extremities normally.   Neurological: He is alert and oriented to person, place, and time. He displays normal reflexes.  Skin: Skin is warm and dry. No rash noted.  Psychiatric: He has a normal mood and affect. His behavior is normal.  Nursing note and vitals reviewed.   ED Course  Procedures   DIAGNOSTIC STUDIES: Oxygen Saturation is 97% on RA, adequate by my interpretation.    COORDINATION OF CARE: 12:11 AM Discussed treatment plan with pt at bedside and pt agreed to plan.   Labs Review Labs Reviewed  CBC - Abnormal; Notable for the following:    Platelets 109 (*)    All other components within normal limits  URINALYSIS, ROUTINE W REFLEX MICROSCOPIC (NOT AT [facility]) - Abnormal; Notable for the following:    APPearance CLOUDY (*)    Hgb urine dipstick LARGE (*)    Ketones, ur 15 (*)    Protein, ur 100 (*)    Nitrite POSITIVE (*)    Leukocytes, UA LARGE (*)    All other components within normal limits  URINE MICROSCOPIC-ADD ON - Abnormal; Notable for the following:    Bacteria, UA MANY  (*)    All other components within normal limits  I-STAT CHEM 8, ED - Abnormal; Notable for the following:    BUN 25 (*)    Glucose, Bld 141 (*)    All other components within normal limits  CULTURE, BLOOD (ROUTINE X 2)  CULTURE, BLOOD (ROUTINE X 2)  URINE CULTURE  CBG MONITORING, ED    Imaging Review No results found.   EKG Interpretation   Date/Time:  Tuesday September 09 2014 20:00:35 EDT Ventricular Rate:  69 PR Interval:  210 QRS Duration: 108 QT Interval:  390 QTC Calculation: 417 R Axis:   -34 Text Interpretation:  Sinus rhythm with 1st degree A-V block Left axis  deviation Left ventricular hypertrophy with repolarization abnormality  Possible Inferior infarct , age undetermined Abnormal ECG Confirmed by  Blaire Hodsdon  MD, Amedeo Detweiler ([id number]) on 09/09/2014 11:51:46 PM      MDM   Final diagnoses:  Weakness  Fall at home    I personally performed the services described in this documentation, which was scribed in my presence. The recorded information has been reviewed and is accurate.  81 year old male with 2 days of worsening generalized weakness, fall today without serious injury noted to have significant urinary tract infection.  Reported fever at home.  Patient has 2 prior episodes of bacteremia associated with UTIs.  Given systemic symptoms, prior history and possible nidus from seeds from prior prostate surgery, will start IV antibiotics after blood cultures, and discuss with hospitalist for admission.     Linton Flemings, MD 09/10/14 [phone]

## 2014-09-09 NOTE — ED Notes (Signed)
Onset yesterday pt felt weak, fell in bathroom and hit head on counter, no LOC.  Pt was at urologist last week for check up and urine was negative for infection.  Pt states he always has painful urination and bladder spasms.  Pain is worse than usual.

## 2014-09-10 ENCOUNTER — Encounter (HOSPITAL_COMMUNITY): Payer: Self-pay | Admitting: *Deleted

## 2014-09-10 DIAGNOSIS — I2510 Atherosclerotic heart disease of native coronary artery without angina pectoris: Secondary | ICD-10-CM | POA: Diagnosis present

## 2014-09-10 DIAGNOSIS — R509 Fever, unspecified: Secondary | ICD-10-CM | POA: Diagnosis present

## 2014-09-10 DIAGNOSIS — Z881 Allergy status to other antibiotic agents status: Secondary | ICD-10-CM | POA: Diagnosis not present

## 2014-09-10 DIAGNOSIS — Z882 Allergy status to sulfonamides status: Secondary | ICD-10-CM | POA: Diagnosis not present

## 2014-09-10 DIAGNOSIS — F0390 Unspecified dementia without behavioral disturbance: Secondary | ICD-10-CM | POA: Diagnosis present

## 2014-09-10 DIAGNOSIS — Z961 Presence of intraocular lens: Secondary | ICD-10-CM | POA: Diagnosis present

## 2014-09-10 DIAGNOSIS — B9620 Unspecified Escherichia coli [E. coli] as the cause of diseases classified elsewhere: Secondary | ICD-10-CM | POA: Diagnosis present

## 2014-09-10 DIAGNOSIS — E785 Hyperlipidemia, unspecified: Secondary | ICD-10-CM | POA: Diagnosis present

## 2014-09-10 DIAGNOSIS — Z87442 Personal history of urinary calculi: Secondary | ICD-10-CM | POA: Diagnosis not present

## 2014-09-10 DIAGNOSIS — M1990 Unspecified osteoarthritis, unspecified site: Secondary | ICD-10-CM | POA: Diagnosis present

## 2014-09-10 DIAGNOSIS — N400 Enlarged prostate without lower urinary tract symptoms: Secondary | ICD-10-CM | POA: Diagnosis present

## 2014-09-10 DIAGNOSIS — I5022 Chronic systolic (congestive) heart failure: Secondary | ICD-10-CM | POA: Diagnosis not present

## 2014-09-10 DIAGNOSIS — I255 Ischemic cardiomyopathy: Secondary | ICD-10-CM | POA: Diagnosis present

## 2014-09-10 DIAGNOSIS — K219 Gastro-esophageal reflux disease without esophagitis: Secondary | ICD-10-CM | POA: Diagnosis present

## 2014-09-10 DIAGNOSIS — Z951 Presence of aortocoronary bypass graft: Secondary | ICD-10-CM | POA: Diagnosis not present

## 2014-09-10 DIAGNOSIS — Z87891 Personal history of nicotine dependence: Secondary | ICD-10-CM | POA: Diagnosis not present

## 2014-09-10 DIAGNOSIS — R531 Weakness: Secondary | ICD-10-CM | POA: Diagnosis not present

## 2014-09-10 DIAGNOSIS — N359 Urethral stricture, unspecified: Secondary | ICD-10-CM | POA: Diagnosis present

## 2014-09-10 DIAGNOSIS — F17220 Nicotine dependence, chewing tobacco, uncomplicated: Secondary | ICD-10-CM | POA: Diagnosis present

## 2014-09-10 DIAGNOSIS — N390 Urinary tract infection, site not specified: Secondary | ICD-10-CM | POA: Diagnosis not present

## 2014-09-10 DIAGNOSIS — N3289 Other specified disorders of bladder: Secondary | ICD-10-CM | POA: Diagnosis present

## 2014-09-10 DIAGNOSIS — I252 Old myocardial infarction: Secondary | ICD-10-CM | POA: Diagnosis not present

## 2014-09-10 DIAGNOSIS — Z9049 Acquired absence of other specified parts of digestive tract: Secondary | ICD-10-CM | POA: Diagnosis present

## 2014-09-10 DIAGNOSIS — Z85820 Personal history of malignant melanoma of skin: Secondary | ICD-10-CM | POA: Diagnosis not present

## 2014-09-10 DIAGNOSIS — I10 Essential (primary) hypertension: Secondary | ICD-10-CM | POA: Diagnosis not present

## 2014-09-10 DIAGNOSIS — R7881 Bacteremia: Secondary | ICD-10-CM | POA: Diagnosis present

## 2014-09-10 DIAGNOSIS — N3281 Overactive bladder: Secondary | ICD-10-CM | POA: Diagnosis present

## 2014-09-10 DIAGNOSIS — Z9842 Cataract extraction status, left eye: Secondary | ICD-10-CM | POA: Diagnosis not present

## 2014-09-10 DIAGNOSIS — Z9841 Cataract extraction status, right eye: Secondary | ICD-10-CM | POA: Diagnosis not present

## 2014-09-10 DIAGNOSIS — Z7982 Long term (current) use of aspirin: Secondary | ICD-10-CM | POA: Diagnosis not present

## 2014-09-10 DIAGNOSIS — N3020 Other chronic cystitis without hematuria: Secondary | ICD-10-CM | POA: Diagnosis present

## 2014-09-10 DIAGNOSIS — Z8546 Personal history of malignant neoplasm of prostate: Secondary | ICD-10-CM | POA: Diagnosis not present

## 2014-09-10 MED ORDER — HEPARIN SODIUM (PORCINE) 5000 UNIT/ML IJ SOLN
5000.0000 [IU] | Freq: Three times a day (TID) | INTRAMUSCULAR | Status: DC
  Administered 2014-09-10 – 2014-09-12 (×7): 5000 [IU] via SUBCUTANEOUS
  Filled 2014-09-10 (×11): qty 1

## 2014-09-10 MED ORDER — TAMSULOSIN HCL 0.4 MG PO CAPS
0.4000 mg | ORAL_CAPSULE | Freq: Every day | ORAL | Status: DC
  Administered 2014-09-10 – 2014-09-11 (×2): 0.4 mg via ORAL
  Filled 2014-09-10 (×3): qty 1

## 2014-09-10 MED ORDER — IBUPROFEN 200 MG PO TABS: 200.0000 mg | ORAL_TABLET | Freq: Four times a day (QID) | ORAL | Status: DC | PRN

## 2014-09-10 MED ORDER — GALANTAMINE HYDROBROMIDE ER 8 MG PO CP24
8.0000 mg | ORAL_CAPSULE | Freq: Every day | ORAL | Status: DC
  Administered 2014-09-10 – 2014-09-12 (×3): 8 mg via ORAL
  Filled 2014-09-10 (×4): qty 1

## 2014-09-10 MED ORDER — HYPROMELLOSE (GONIOSCOPIC) 2.5 % OP SOLN
1.0000 [drp] | Freq: Two times a day (BID) | OPHTHALMIC | Status: DC
  Administered 2014-09-10 – 2014-09-12 (×4): 1 [drp] via OPHTHALMIC
  Filled 2014-09-10: qty 15

## 2014-09-10 MED ORDER — DEXTROSE 5 % IV SOLN
1.0000 g | INTRAVENOUS | Status: DC
  Administered 2014-09-10: 1 g via INTRAVENOUS
  Filled 2014-09-10 (×2): qty 10

## 2014-09-10 MED ORDER — ACETAMINOPHEN 500 MG PO TABS
500.0000 mg | ORAL_TABLET | Freq: Four times a day (QID) | ORAL | Status: DC | PRN
  Administered 2014-09-10 – 2014-09-12 (×4): 500 mg via ORAL
  Filled 2014-09-10 (×4): qty 1

## 2014-09-10 MED ORDER — NITROGLYCERIN 0.4 MG SL SUBL
0.4000 mg | SUBLINGUAL_TABLET | SUBLINGUAL | Status: DC | PRN
Start: 2014-09-10 — End: 2014-09-12

## 2014-09-10 MED ORDER — FAMOTIDINE 20 MG PO TABS
20.0000 mg | ORAL_TABLET | ORAL | Status: DC | PRN
Start: 2014-09-10 — End: 2014-09-10
  Filled 2014-09-10: qty 1

## 2014-09-10 MED ORDER — ATORVASTATIN CALCIUM 40 MG PO TABS
40.0000 mg | ORAL_TABLET | Freq: Every evening | ORAL | Status: DC
  Administered 2014-09-10 – 2014-09-11 (×2): 40 mg via ORAL
  Filled 2014-09-10 (×3): qty 1

## 2014-09-10 MED ORDER — ASPIRIN EC 81 MG PO TBEC
81.0000 mg | DELAYED_RELEASE_TABLET | Freq: Every day | ORAL | Status: DC
  Administered 2014-09-10 – 2014-09-12 (×3): 81 mg via ORAL
  Filled 2014-09-10 (×3): qty 1

## 2014-09-10 MED ORDER — DEXTROSE 5 % IV SOLN
1.0000 g | Freq: Once | INTRAVENOUS | Status: AC
  Administered 2014-09-10: 1 g via INTRAVENOUS
  Filled 2014-09-10: qty 10

## 2014-09-10 MED ORDER — HYDROCODONE-ACETAMINOPHEN 5-325 MG PO TABS
1.0000 | ORAL_TABLET | Freq: Four times a day (QID) | ORAL | Status: DC | PRN
  Administered 2014-09-10: 1 via ORAL
  Filled 2014-09-10: qty 1

## 2014-09-10 MED ORDER — ADULT MULTIVITAMIN W/MINERALS CH
1.0000 | ORAL_TABLET | Freq: Every day | ORAL | Status: DC
  Administered 2014-09-10 – 2014-09-12 (×3): 1 via ORAL
  Filled 2014-09-10 (×3): qty 1

## 2014-09-10 MED ORDER — FAMOTIDINE 20 MG PO TABS
20.0000 mg | ORAL_TABLET | Freq: Two times a day (BID) | ORAL | Status: DC | PRN
  Filled 2014-09-10: qty 1

## 2014-09-10 MED ORDER — URELLE 81 MG PO TABS: 1.0000 | ORAL_TABLET | Freq: Four times a day (QID) | ORAL | Status: DC | PRN

## 2014-09-10 MED ORDER — ISOSORBIDE MONONITRATE ER 30 MG PO TB24
30.0000 mg | ORAL_TABLET | Freq: Every morning | ORAL | Status: DC
  Administered 2014-09-10 – 2014-09-12 (×3): 30 mg via ORAL
  Filled 2014-09-10 (×3): qty 1

## 2014-09-10 NOTE — Progress Notes (Signed)
Called ER RN for report. Roomready for admit.

## 2014-09-10 NOTE — Progress Notes (Signed)
[facility]                                                                              Patient Demographics  Ronald [name], is a 81 y.o. male, DOB - [birth year], [id number]  Admit date - 09/09/2014   Admitting Physician Etta Quill, DO  Outpatient Primary MD for the patient is Nyoka Cowden, MD  LOS -    Chief Complaint  Patient presents with  . Weakness       Brief HPI   Patient is a 81 year old male with prostate cancer, radiation seed implants in 98, hyperlipidemia, CAD, presented to ED with 2 day history of intermittent weakness and dizziness, fevers. He had a fever of 102 with chills. Patient fell to his knees in the bathroom about 5 PM in the evening on the day of admission. Patient had 2 episodes of UTI both with sepsis last year.   Assessment & Plan    Principal Problem: UTI (lower urinary tract infection) - UA grossly positive for UTI, - Urine culture pending, follow blood cultures closely, prior UTIs with sepsis - Continue IV Rocephin  Active Problems:   HTN (hypertension) - Currently stable  BPH Continue Flomax  Hyperlipidemia Continue Lipitor  Essential hypertension Currently stable, continue Imdur  CAD: No chest pain or shortness of breath Continue aspirin, Lipitor, Imdur    Code Status: Full code  Family Communication: Discussed in detail with the patient, all imaging results, lab results explained to the patient and wife at the bedside    Disposition Plan: Await blood cultures and urine cultures  Time Spent in minutes  25 minutes  Procedures  None   Consults   None   DVT Prophylaxis   heparin   Medications  Scheduled Meds: . aspirin EC  81 mg Oral Daily  . atorvastatin  40 mg Oral QPM  . cefTRIAXone (ROCEPHIN)  IV  1 g Intravenous Q24H  . galantamine  8 mg Oral Q breakfast  . heparin  5,000 Units Subcutaneous 3 times per day  . hydroxypropyl methylcellulose / hypromellose  1 drop Both Eyes  BID  . isosorbide mononitrate  30 mg Oral q morning - 10a  . multivitamin with minerals  1 tablet Oral Daily  . tamsulosin  0.4 mg Oral QPC supper   Continuous Infusions:  PRN Meds:.acetaminophen, famotidine, HYDROcodone-acetaminophen, ibuprofen, nitroGLYCERIN   Antibiotics   Anti-infectives    Start     Dose/Rate Route Frequency Ordered Stop   09/10/14 2200  cefTRIAXone (ROCEPHIN) 1 g in dextrose 5 % 50 mL IVPB     1 g 100 mL/hr over 30 Minutes Intravenous Every 24 hours 09/10/14 0111     09/10/14 0015  cefTRIAXone (ROCEPHIN) 1 g in dextrose 5 % 50 mL IVPB     1 g 100 mL/hr over 30 Minutes Intravenous  Once 09/10/14 0009 09/10/14 0138        Subjective:   Ronald [name] was seen and examined today. Overall feeling better today. Patient denies dizziness, chest pain, shortness of breath, abdominal pain, N/V/D/C, new weakness, numbess, tingling. No acute events overnight.    Objective:  Blood pressure 104/69, pulse 65, temperature 97.4 F (36.3 C), temperature source Oral, resp. rate 20, height 5\' 5"  (1.651 m), weight 71 kg (156 lb 8.4 oz), SpO2 99 %.  Wt Readings from Last 3 Encounters:  09/10/14 71 kg (156 lb 8.4 oz)  06/30/14 74.844 kg (165 lb)  06/09/14 73.846 kg (162 lb 12.8 oz)     Intake/Output Summary (Last 24 hours) at 09/10/14 1146 Last data filed at 09/10/14 0931  Gross per 24 hour  Intake    840 ml  Output      0 ml  Net    840 ml    Exam  General: Alert and oriented x 3, NAD  HEENT:  PERRLA, EOMI, Anicteric Sclera, mucous membranes moist.   Neck: Supple, no JVD, no masses  CVS: S1 S2 auscultated, no rubs, murmurs or gallops. Regular rate and rhythm.  Respiratory: Clear to auscultation bilaterally, no wheezing, rales or rhonchi  Abdomen: Soft, nontender, nondistended, + bowel sounds  Ext: no cyanosis clubbing or edema  Neuro: AAOx3, Cr N's II- XII. Strength 5/5 upper and lower extremities bilaterally  Skin: No rashes  Psych: Normal affect  and demeanor, alert and oriented x3    Data Review   Micro Results No results found for this or any previous visit (from the past 240 hour(s)).  Radiology Reports No results found.  CBC  Recent Labs Lab 09/09/14 2007 09/09/14 2016  WBC 10.2  --   HGB 14.4 15.3  HCT 42.7 45.0  PLT 109*  --   MCV 92.0  --   MCH 31.0  --   MCHC 33.7  --   RDW 13.7  --     Chemistries   Recent Labs Lab 09/09/14 2016  NA 138  K 4.3  CL 105  GLUCOSE 141*  BUN 25*  CREATININE 1.10   ------------------------------------------------------------------------------------------------------------------ estimated creatinine clearance is 42.7 mL/min (by C-G formula based on Cr of 1.1). ------------------------------------------------------------------------------------------------------------------ No results for input(s): HGBA1C in the last 72 hours. ------------------------------------------------------------------------------------------------------------------ No results for input(s): CHOL, HDL, LDLCALC, TRIG, CHOLHDL, LDLDIRECT in the last 72 hours. ------------------------------------------------------------------------------------------------------------------ No results for input(s): TSH, T4TOTAL, T3FREE, THYROIDAB in the last 72 hours.  Invalid input(s): FREET3 ------------------------------------------------------------------------------------------------------------------ No results for input(s): VITAMINB12, FOLATE, FERRITIN, TIBC, IRON, RETICCTPCT in the last 72 hours.  Coagulation profile No results for input(s): INR, PROTIME in the last 168 hours.  No results for input(s): DDIMER in the last 72 hours.  Cardiac Enzymes No results for input(s): CKMB, TROPONINI, MYOGLOBIN in the last 168 hours.  Invalid input(s): CK ------------------------------------------------------------------------------------------------------------------ Invalid input(s): POCBNP  No results for  input(s): GLUCAP in the last 72 hours.   Amit Leece M.D. [facility] 09/10/2014, 11:46 AM  Pager: [phone]   Between 7am to 7pm - call Pager - [phone]  After 7pm go to [email or web address] - password [email or web address]  Call night coverage person covering after 7pm

## 2014-09-10 NOTE — H&P (Signed)
[facility] History and Physical  Ronald [name] [id number] DOB: [birth year] DOA: 09/09/2014  Referring physician: EDP PCP: Nyoka Cowden, MD   Chief Complaint: Weakness   HPI: Ronald [name] is a 81 y.o. male with h/o prostate cancer, radiation seed implants in 39.  Patient presents to ED with c/o 2 day history of intermittent weakness and dizziness.  Fever with Tm of 102 today with chills.  Fall to knees in bathroom at about 1700 in the evening.  Patient has had 2 episodes of UTI recently both with sepsis last in aug/sept of last year.  Symptoms are similar to prior UTIs and wife "felt it would be better to get him in sooner rather than later given how sick he gets with these".  Review of Systems: Systems reviewed.  As above, otherwise negative  Past Medical History  Diagnosis Date  . Hyperlipidemia   . S/P CABG (coronary artery bypass graft)     [place]  . History of Bell's palsy     RIGHT SIDE-- NO RESIDUAL  . Gross hematuria   . Urethral stricture   . OAB (overactive bladder)   . Chronic cystitis   . Ischemic cardiomyopathy     ef 35-40% per cath 08-28-2013  . Coronary artery disease CARDIOLOGIST-  DR Grace Bushy  MI -- S/P  11/89 CABG x6 (70% circ; 90% PD; 60-70% distal left main; 30% left circ; 90% 1st diag;, 2nd diag and 3rd diag. w/90%,  mild stenosis LAD and 60-70% pLAD)  Re-do CABG x5 in 1997  . History of nonmelanoma skin cancer     excision's from scalp  . Wears hearing aid     bilateral  . Wears partial dentures   . At risk for sleep apnea     STOP-BANG= 4    SENT TO PCP 12-23-2013  . GERD (gastroesophageal reflux disease)   . Mild dementia   . Kidney stones "years ago"    "passed them"  . Hypertension     "not anymore" (01/02/2014)  . Myocardial infarction 1986; 1997  . Arthritis     "joints ache" (01/02/2014)  . Prostate cancer 1998    S/P  RADIACTIVE SEED IMPLANTS; UROLOGIST-  DR GRAPEY  . Melanoma of ear     "right"    Past Surgical History  Procedure Laterality Date  . Eus  10/05/2011    Procedure: ESOPHAGEAL ENDOSCOPIC ULTRASOUND (EUS) RADIAL;  Surgeon: Arta Silence, MD;  Location: [facility];  Service: Endoscopy;  Laterality: N/A;  . Tonsillectomy and adenoidectomy  1954  . Laparoscopic cholecystectomy  12-23-2005  . Radioactive prostate seed implants  1998  . Coronary artery bypass graft  11/ 1989  &  10/ 1997    1989-- 6 vessel/  1997 Re-do 5 vessel  . Cardiovascular stress test  08-01-2011  dr Angelena Form    inferior scar and possible soft tissue attenuation with minimal peri-infarct ischemia, small region of anterior ischemia and scar/  LVEF 53% LV wall motion with inferior hypokinesis/ no significant change from scan july 2011  . Cataract extraction w/ intraocular lens  implant, bilateral Bilateral 2007  . Cystoscopy with urethral dilatation N/A 12/25/2013    Procedure: CYSTOSCOPY WITH URETHRAL DILATATION, WITH BIOPSY;  Surgeon: Bernestine Amass, MD;  Location: [facility];  Service: Urology;  Laterality: N/A;  . Melanoma excision  X 1    "ear"  . Cardiac catheterization  02-22-2005  dr Vidal Schwalbe  mild to moderate lv dysfunction with inferobasilar akinesis/  totally occluded SVG to Intermediate Diagonal and SVG to PDA and RCA branches, totally occluded native coronary circulation with diffuse disease pLAD diagonal system with potentially could be ischemic/  patent SVG to OM with collaterals to dRCA and patent LIMA to LAD and diagonal systemss  . Cardiac catheterization  08-28-2013  DR Daneen Schick    widely patent sequential left internal graft to the diagonal/LAD, widely patent SVG to OM with proximal 50% narrowing noted in the graft, total occlusion SVG's to RCA, RI, and the Diagonal/ LV dysfunction with inferobasal aneurysm and mid anterior wall region of akinesis/  overall EF 35-40%/  Total occlusion of the navtive circulation/  no significant change compared to 2006 cath  . Skin  cancer excision  X 2    "top of head"  . Left heart catheterization with coronary/graft angiogram N/A 08/28/2013    Procedure: LEFT HEART CATHETERIZATION WITH Beatrix Fetters;  Surgeon: Sinclair Grooms, MD;  Location: [facility] CATH LAB;  Service: Cardiovascular;  Laterality: N/A;   Social History:  reports that he quit smoking about 30 years ago. His smoking use included Cigars. He quit smokeless tobacco use about a year ago. His smokeless tobacco use included Chew. He reports that he does not drink alcohol or use illicit drugs.  Allergies  Allergen Reactions  . Pantoprazole Other (See Comments)    Headache and lightheaded  . Ciprofloxacin Swelling and Hives    Allergy classified as swelling here, but pt says Cipro just doesn't work for him    . Nitrofurantoin Hives  . Sulfa Antibiotics Hives and Itching    Family History  Problem Relation Age of Onset  . Heart disease Mother   . Diabetes Father   . Heart disease Brother      Prior to Admission medications   Medication Sig Start Date End Date Taking? Authorizing Provider  acetaminophen (TYLENOL) 500 MG tablet Take 500 mg by mouth every 6 (six) hours as needed for pain or fever. Take 1 -2 pills   Yes Historical Provider, MD  aspirin EC 81 MG EC tablet Take 1 tablet (81 mg total) by mouth daily. 08/29/13  Yes Brittainy Erie Noe, PA-C  atorvastatin (LIPITOR) 80 MG tablet Take 40 mg by mouth every evening. 09/23/13  Yes Imogene Burn, PA-C  Calcium Carbonate-Vitamin D (CALCIUM + D PO) Take 1 tablet by mouth 2 (two) times daily.    Yes Historical Provider, MD  Cholecalciferol (VITAMIN D) 2000 UNITS tablet Take 2,000 Units by mouth daily.   Yes Historical Provider, MD  galantamine (RAZADYNE ER) 8 MG 24 hr capsule Take 8 mg by mouth daily with breakfast.   Yes Historical Provider, MD  HYDROcodone-acetaminophen (NORCO/VICODIN) 5-325 MG per tablet Take 1 tablet by mouth every 6 (six) hours as needed for moderate pain.   Yes Historical  Provider, MD  hydroxypropyl methylcellulose (ISOPTO TEARS) 2.5 % ophthalmic solution Place 1 drop into both eyes 2 (two) times daily.    Yes Historical Provider, MD  ibuprofen (ADVIL,MOTRIN) 200 MG tablet Take 200 mg by mouth every 6 (six) hours as needed.   Yes Historical Provider, MD  isosorbide mononitrate (IMDUR) 60 MG 24 hr tablet Take 0.5 tablets (30 mg total) by mouth every morning. 01/07/14  Yes Shanker Kristeen Mans, MD  Multiple Vitamin (MULTIVITAMIN WITH MINERALS) TABS Take 1 tablet by mouth daily.   Yes Historical Provider, MD  nitroGLYCERIN (NITROSTAT) 0.4 MG SL tablet Place 0.4  mg under the tongue every 5 (five) minutes as needed for chest pain.    Yes Historical Provider, MD  ranitidine (ZANTAC) 150 MG tablet Take 150 mg by mouth as needed for heartburn.   Yes Historical Provider, MD  silodosin (RAPAFLO) 8 MG CAPS capsule Take 8 mg by mouth every evening.    Yes Historical Provider, MD  URELLE (URELLE/URISED) 81 MG TABS tablet Take 1 tablet (81 mg total) by mouth every 6 (six) hours as needed for bladder spasms. 01/07/14  Yes Shanker Kristeen Mans, MD   Physical Exam: Filed Vitals:   09/09/14 2348  BP: 125/66  Pulse: 62  Temp:   Resp: 19    BP 125/66 mmHg  Pulse 62  Temp(Src) 98.3 F (36.8 C) (Oral)  Resp 19  Ht 5\' 5"  (1.651 m)  SpO2 97%  General Appearance:    Alert, oriented, no distress, appears stated age  Head:    Normocephalic, atraumatic  Eyes:    PERRL, EOMI, sclera non-icteric        Nose:   Nares without drainage or epistaxis. Mucosa, turbinates normal  Throat:   Moist mucous membranes. Oropharynx without erythema or exudate.  Neck:   Supple. No carotid bruits.  No thyromegaly.  No lymphadenopathy.   Back:     No CVA tenderness, no spinal tenderness  Lungs:     Clear to auscultation bilaterally, without wheezes, rhonchi or rales  Chest wall:    No tenderness to palpitation  Heart:    Regular rate and rhythm without murmurs, gallops, rubs  Abdomen:     Soft,  non-tender, nondistended, normal bowel sounds, no organomegaly  Genitalia:    deferred  Rectal:    deferred  Extremities:   No clubbing, cyanosis or edema.  Pulses:   2+ and symmetric all extremities  Skin:   Skin color, texture, turgor normal, no rashes or lesions  Lymph nodes:   Cervical, supraclavicular, and axillary nodes normal  Neurologic:   CNII-XII intact. Normal strength, sensation and reflexes      throughout    Labs on Admission:  Basic Metabolic Panel:  Recent Labs Lab 09/09/14 2016  NA 138  K 4.3  CL 105  GLUCOSE 141*  BUN 25*  CREATININE 1.10   Liver Function Tests: No results for input(s): AST, ALT, ALKPHOS, BILITOT, PROT, ALBUMIN in the last 168 hours. No results for input(s): LIPASE, AMYLASE in the last 168 hours. No results for input(s): AMMONIA in the last 168 hours. CBC:  Recent Labs Lab 09/09/14 2007 09/09/14 2016  WBC 10.2  --   HGB 14.4 15.3  HCT 42.7 45.0  MCV 92.0  --   PLT 109*  --    Cardiac Enzymes: No results for input(s): CKTOTAL, CKMB, CKMBINDEX, TROPONINI in the last 168 hours.  BNP (last 3 results) No results for input(s): PROBNP in the last 8760 hours. CBG: No results for input(s): GLUCAP in the last 168 hours.  Radiological Exams on Admission: No results found.  EKG: Independently reviewed.  Assessment/Plan Principal Problem:   UTI (lower urinary tract infection) Active Problems:   HTN (hypertension)   Chronic systolic CHF (congestive heart failure)   Fever   1. UTI - UA confirms wifes suspicion and is frankly positive for UTI 1. Empiric rocephin 2. Cultures pending 2. Fever - no other sepsis criteria right now 1. Tylenol PRN fever 3. HTN - continue home meds 4. Chronic systolic CHF - continue home meds    Code Status: Full  Family Communication: Wife at bedside Disposition Plan: Admit to obs   Time spent: 50 min  Tess Potts M. [facility] Pager [phone]  If 7AM-7PM, please contact the day  team taking care of the patient [email or web address] Password [email or web address] 09/10/2014, 1:14 AM

## 2014-09-11 DIAGNOSIS — I5022 Chronic systolic (congestive) heart failure: Secondary | ICD-10-CM

## 2014-09-11 DIAGNOSIS — R509 Fever, unspecified: Secondary | ICD-10-CM

## 2014-09-11 DIAGNOSIS — I10 Essential (primary) hypertension: Secondary | ICD-10-CM

## 2014-09-11 LAB — BASIC METABOLIC PANEL
Anion gap: 10 (ref 5–15)
BUN: 22 mg/dL — ABNORMAL HIGH (ref 6–20)
CO2: 25 mmol/L (ref 22–32)
Calcium: 8.8 mg/dL — ABNORMAL LOW (ref 8.9–10.3)
Chloride: 103 mmol/L (ref 101–111)
Creatinine, Ser: 1.13 mg/dL (ref 0.61–1.24)
GFR calc Af Amer: >60 mL/min (ref >60)
GFR calc non Af Amer: 57 mL/min — ABNORMAL LOW (ref >60)
Glucose, Bld: 117 mg/dL — ABNORMAL HIGH (ref 65–99)
Potassium: 4.6 mmol/L (ref 3.5–5.1)
Sodium: 138 mmol/L (ref 135–145)

## 2014-09-11 LAB — CBC
HCT: 41.3 % (ref 39.0–52.0)
Hemoglobin: 13.7 g/dL (ref 13.0–17.0)
MCH: 30.4 pg (ref 26.0–34.0)
MCHC: 33.2 g/dL (ref 30.0–36.0)
MCV: 91.8 fL (ref 78.0–100.0)
Platelets: 95 10*3/uL — ABNORMAL LOW (ref 150–400)
RBC: 4.5 MIL/uL (ref 4.22–5.81)
RDW: 13.6 % (ref 11.5–15.5)
WBC: 6.5 10*3/uL (ref 4.0–10.5)

## 2014-09-11 MED ORDER — SODIUM CHLORIDE 0.9 % IV SOLN
INTRAVENOUS | Status: DC
  Administered 2014-09-11 – 2014-09-12 (×2): via INTRAVENOUS

## 2014-09-11 MED ORDER — CEFTRIAXONE SODIUM IN DEXTROSE 40 MG/ML IV SOLN
2.0000 g | INTRAVENOUS | Status: DC
  Administered 2014-09-11: 2 g via INTRAVENOUS
  Filled 2014-09-11 (×2): qty 50

## 2014-09-11 NOTE — Evaluation (Signed)
Physical Therapy Evaluation Patient Details Name: Ronald [name] MRN: [id number] DOB: 09-18-1927 Today's Date: 09/11/2014   History of Present Illness  Patient is an 81 yo male admitted 09/09/14 with UTI and weakness.  PMH:  CAD, CABG, ICM with EF 35-40%, HOH, mild dementia, HTN, MI, CHF  Clinical Impression  Patient presents with problems listed below.  Will benefit from acute PT to maximize functional independence prior to discharge home with wife.  Patient declined f/u HHPT.  Recommend RW for safety with gait.    Follow Up Recommendations Supervision/Assistance - 24 hour;Home health PT (Patient declined HHPT)    Equipment Recommendations  Rolling walker with 5" wheels    Recommendations for Other Services       Precautions / Restrictions Precautions Precautions: Fall Precaution Comments: Patient reports fall pta - down on to knees Restrictions Weight Bearing Restrictions: No      Mobility  Bed Mobility Overal bed mobility: Modified Independent                Transfers Overall transfer level: Needs assistance Equipment used: Rolling walker (2 wheeled) Transfers: Sit to/from Stand Sit to Stand: Min guard         General transfer comment: Verbal cues for hand placement.  Assist for safety.  Ambulation/Gait Ambulation/Gait assistance: Min assist Ambulation Distance (Feet): 220 Feet Assistive device: Rolling walker (2 wheeled) Gait Pattern/deviations: Step-through pattern;Decreased stride length;Trunk flexed     General Gait Details: Verbal cues for safe use of RW.  Verbal cues to stand upright and to move at a safe pace.  Slightly unsteady.  Stairs            Wheelchair Mobility    Modified Rankin (Stroke Patients Only)       Balance                                             Pertinent Vitals/Pain Pain Assessment: No/denies pain    Home Living Family/patient expects to be discharged to:: Private residence Living  Arrangements: Spouse/significant other Available Help at Discharge: Family;Available 24 hours/day Type of Home: House Home Access: Stairs to enter Entrance Stairs-Rails: Psychiatric nurse of Steps: 3 Home Layout: One level Home Equipment: Cane - single point Additional Comments: Patient states he likes doing yard work - "keeps me moving"    Prior Function Level of Independence: Independent               Journalist, newspaper        Extremity/Trunk Assessment   Upper Extremity Assessment: Overall WFL for tasks assessed           Lower Extremity Assessment: Generalized weakness      Cervical / Trunk Assessment: Kyphotic  Communication   Communication: HOH  Cognition Arousal/Alertness: Awake/alert Behavior During Therapy: WFL for tasks assessed/performed Overall Cognitive Status: History of cognitive impairments - at baseline (mild dementia)                      General Comments      Exercises        Assessment/Plan    PT Assessment Patient needs continued PT services  PT Diagnosis Difficulty walking;Generalized weakness   PT Problem List Decreased strength;Decreased balance;Decreased mobility;Decreased knowledge of use of DME  PT Treatment Interventions DME instruction;Gait training;Functional mobility training;Therapeutic activities;Stair training;Patient/family education   PT Goals (Current  goals can be found in the Care Plan section) Acute Rehab PT Goals Patient Stated Goal: To go home and pick garden PT Goal Formulation: With patient Time For Goal Achievement: 09/18/14 Potential to Achieve Goals: Good    Frequency Min 3X/week   Barriers to discharge        Co-evaluation               End of Session Equipment Utilized During Treatment: Gait belt Activity Tolerance: Patient tolerated treatment well Patient left: in bed;with call bell/phone within reach (sitting EOB) Nurse Communication: Mobility status          Time: [id number] PT Time Calculation (min) (ACUTE ONLY): 19 min   Charges:   PT Evaluation $Initial PT Evaluation Tier I: 1 Procedure     PT G CodesDespina Pole 2014-09-14, 4:43 PM Carita Pian. Sanjuana Kava, [place] Pager [phone]

## 2014-09-11 NOTE — Progress Notes (Signed)
[facility]                                                                              Patient Demographics  Ronald [name], is a 81 y.o. male, DOB - [birth year], [id number]  Admit date - 09/09/2014   Admitting Physician Etta Quill, DO  Outpatient Primary MD for the patient is Nyoka Cowden, MD  LOS - 1   Chief Complaint  Patient presents with  . Weakness       Brief HPI   Patient is a 81 year old male with prostate cancer, radiation seed implants in 98, hyperlipidemia, CAD, presented to ED with 2 day history of intermittent weakness and dizziness, fevers. He had a fever of 102 with chills. Patient fell to his knees in the bathroom about 5 PM in the evening on the day of admission. Patient had 2 episodes of UTI both with sepsis last year.   Assessment & Plan    Principal Problem: UTI (lower urinary tract infection) with Escherichia coli bacteremia likely due to UTI - UA grossly positive for UTI, urine culture showing Escherichia coli - - Continue IV Rocephin for now, will await sensitivities  Active Problems:   HTN (hypertension) - Currently stable  BPH Continue Flomax  Hyperlipidemia Continue Lipitor  Essential hypertension Currently stable, continue Imdur  CAD: No chest pain or shortness of breath Continue aspirin, Lipitor, Imdur  Dizziness - Patient feeling weak and dizzy this morning, will place on gentle hydration and PT evaluation  Code Status: Full code  Family Communication: Discussed in detail with the patient, all imaging results, lab results explained to the patient    Disposition Plan: Await blood cultures and urine cultures  Time Spent in minutes  25 minutes  Procedures  None   Consults   None   DVT Prophylaxis   heparin   Medications  Scheduled Meds: . aspirin EC  81 mg Oral Daily  . atorvastatin  40 mg Oral QPM  . cefTRIAXone (ROCEPHIN)  IV  1 g Intravenous Q24H  . galantamine  8 mg Oral Q  breakfast  . heparin  5,000 Units Subcutaneous 3 times per day  . hydroxypropyl methylcellulose / hypromellose  1 drop Both Eyes BID  . isosorbide mononitrate  30 mg Oral q morning - 10a  . multivitamin with minerals  1 tablet Oral Daily  . tamsulosin  0.4 mg Oral QPC supper   Continuous Infusions:  PRN Meds:.acetaminophen, famotidine, HYDROcodone-acetaminophen, ibuprofen, nitroGLYCERIN   Antibiotics   Anti-infectives    Start     Dose/Rate Route Frequency Ordered Stop   09/10/14 2200  cefTRIAXone (ROCEPHIN) 1 g in dextrose 5 % 50 mL IVPB     1 g 100 mL/hr over 30 Minutes Intravenous Every 24 hours 09/10/14 0111     09/10/14 0015  cefTRIAXone (ROCEPHIN) 1 g in dextrose 5 % 50 mL IVPB     1 g 100 mL/hr over 30 Minutes Intravenous  Once 09/10/14 0009 09/10/14 0138        Subjective:   Ronald [name] was seen and examined today. Feeling weak this morning, states when he got up from bed  this morning, felt dizzy. Patient denies chest pain, shortness of breath, abdominal pain, N/V/D/C, new weakness, numbess, tingling. No acute events overnight.    Objective:   Blood pressure 116/61, pulse 64, temperature 98.2 F (36.8 C), temperature source Oral, resp. rate 18, height 5\' 5"  (1.651 m), weight 74.5 kg (164 lb 3.9 oz), SpO2 98 %.  Wt Readings from Last 3 Encounters:  09/10/14 74.5 kg (164 lb 3.9 oz)  06/30/14 74.844 kg (165 lb)  06/09/14 73.846 kg (162 lb 12.8 oz)     Intake/Output Summary (Last 24 hours) at 09/11/14 1237 Last data filed at 09/11/14 1208  Gross per 24 hour  Intake    500 ml  Output    200 ml  Net    300 ml    Exam  General: Alert and oriented x 3, NAD  HEENT:  PERRLA, EOMI, Anicteric Sclera, mucous membranes moist.   Neck: Supple, no JVD, no masses  CVS: S1 S2 auscultated, no rubs, murmurs or gallops. Regular rate and rhythm.  Respiratory: Clear to auscultation bilaterally, no wheezing, rales or rhonchi  Abdomen: Soft, nontender, nondistended, +  bowel sounds  Ext: no cyanosis clubbing or edema  Neuro: AAOx3, Cr N's II- XII. Strength 5/5 upper and lower extremities bilaterally  Skin: No rashes  Psych: Normal affect and demeanor, alert and oriented x3    Data Review   Micro Results Recent Results (from the past 240 hour(s))  Urine culture     Status: None (Preliminary result)   Collection Time: 09/09/14  8:11 PM  Result Value Ref Range Status   Specimen Description URINE, RANDOM  Final   Special Requests ADDED 09/10/14 0553  Final   Culture   Final    ESCHERICHIA COLI Performed at [facility]    Report Status PENDING  Incomplete  Culture, blood (routine x 2)     Status: None (Preliminary result)   Collection Time: 09/10/14 12:15 AM  Result Value Ref Range Status   Specimen Description BLOOD RIGHT ARM  Final   Special Requests BOTTLES DRAWN AEROBIC AND ANAEROBIC 10CC EACH  Final   Culture   Final    ESCHERICHIA COLI Note: Gram Stain Report Called to,Read Back By and Verified With: JASMINE A BY INGRAM A 09/10/14 305P Performed at [facility]    Report Status PENDING  Incomplete  Culture, blood (routine x 2)     Status: None (Preliminary result)   Collection Time: 09/10/14 12:22 AM  Result Value Ref Range Status   Specimen Description BLOOD RIGHT HAND  Final   Special Requests BOTTLES DRAWN AEROBIC ONLY 10CC  Final   Culture   Final           BLOOD CULTURE RECEIVED NO GROWTH TO DATE CULTURE WILL BE HELD FOR 5 DAYS BEFORE ISSUING A FINAL NEGATIVE REPORT Performed at [facility]    Report Status PENDING  Incomplete    Radiology Reports No results found.  CBC  Recent Labs Lab 09/09/14 2007 09/09/14 2016 09/11/14 0655  WBC 10.2  --  6.5  HGB 14.4 15.3 13.7  HCT 42.7 45.0 41.3  PLT 109*  --  95*  MCV 92.0  --  91.8  MCH 31.0  --  30.4  MCHC 33.7  --  33.2  RDW 13.7  --  13.6    Chemistries   Recent Labs Lab 09/09/14 2016 09/11/14 0655  NA 138 138  K 4.3 4.6  CL 105  103  CO2  --  25  GLUCOSE 141* 117*  BUN 25* 22*  CREATININE 1.10 1.13  CALCIUM  --  8.8*   ------------------------------------------------------------------------------------------------------------------ estimated creatinine clearance is 45.1 mL/min (by C-G formula based on Cr of 1.13). ------------------------------------------------------------------------------------------------------------------ No results for input(s): HGBA1C in the last 72 hours. ------------------------------------------------------------------------------------------------------------------ No results for input(s): CHOL, HDL, LDLCALC, TRIG, CHOLHDL, LDLDIRECT in the last 72 hours. ------------------------------------------------------------------------------------------------------------------ No results for input(s): TSH, T4TOTAL, T3FREE, THYROIDAB in the last 72 hours.  Invalid input(s): FREET3 ------------------------------------------------------------------------------------------------------------------ No results for input(s): VITAMINB12, FOLATE, FERRITIN, TIBC, IRON, RETICCTPCT in the last 72 hours.  Coagulation profile No results for input(s): INR, PROTIME in the last 168 hours.  No results for input(s): DDIMER in the last 72 hours.  Cardiac Enzymes No results for input(s): CKMB, TROPONINI, MYOGLOBIN in the last 168 hours.  Invalid input(s): CK ------------------------------------------------------------------------------------------------------------------ Invalid input(s): POCBNP  No results for input(s): GLUCAP in the last 72 hours.   Bonham Zingale M.D. [facility] 09/11/2014, 12:37 PM  Pager: [phone]   Between 7am to 7pm - call Pager - [phone]  After 7pm go to [email or web address] - password [email or web address]  Call night coverage person covering after 7pm

## 2014-09-12 LAB — CULTURE, BLOOD (ROUTINE X 2)

## 2014-09-12 LAB — CBC
HCT: 39.1 % (ref 39.0–52.0)
Hemoglobin: 12.9 g/dL — ABNORMAL LOW (ref 13.0–17.0)
MCH: 30.4 pg (ref 26.0–34.0)
MCHC: 33 g/dL (ref 30.0–36.0)
MCV: 92 fL (ref 78.0–100.0)
Platelets: 94 10*3/uL — ABNORMAL LOW (ref 150–400)
RBC: 4.25 MIL/uL (ref 4.22–5.81)
RDW: 13.4 % (ref 11.5–15.5)
WBC: 4.2 10*3/uL (ref 4.0–10.5)

## 2014-09-12 LAB — BASIC METABOLIC PANEL
Anion gap: 8 (ref 5–15)
BUN: 20 mg/dL (ref 6–20)
CO2: 25 mmol/L (ref 22–32)
Calcium: 8.7 mg/dL — ABNORMAL LOW (ref 8.9–10.3)
Chloride: 105 mmol/L (ref 101–111)
Creatinine, Ser: 0.99 mg/dL (ref 0.61–1.24)
GFR calc Af Amer: >60 mL/min (ref >60)
GFR calc non Af Amer: >60 mL/min (ref >60)
Glucose, Bld: 118 mg/dL — ABNORMAL HIGH (ref 65–99)
Potassium: 3.8 mmol/L (ref 3.5–5.1)
Sodium: 138 mmol/L (ref 135–145)

## 2014-09-12 LAB — URINE CULTURE

## 2014-09-12 MED ORDER — CEPHALEXIN 500 MG PO CAPS: 500.0000 mg | ORAL_CAPSULE | Freq: Four times a day (QID) | ORAL | Status: DC

## 2014-09-12 MED ORDER — CEFUROXIME AXETIL 500 MG PO TABS: 500.0000 mg | ORAL_TABLET | Freq: Two times a day (BID) | ORAL | Status: DC

## 2014-09-12 MED ORDER — CEFUROXIME AXETIL 500 MG PO TABS
500.0000 mg | ORAL_TABLET | Freq: Two times a day (BID) | ORAL | Status: DC
  Filled 2014-09-12 (×2): qty 1

## 2014-09-12 MED ORDER — CEPHALEXIN 500 MG PO CAPS
1000.0000 mg | ORAL_CAPSULE | Freq: Three times a day (TID) | ORAL | Status: DC
  Filled 2014-09-12 (×3): qty 2

## 2014-09-12 MED ORDER — FLAVOXATE HCL 100 MG PO TABS
100.0000 mg | ORAL_TABLET | Freq: Three times a day (TID) | ORAL | Status: DC | PRN
  Administered 2014-09-12: 100 mg via ORAL
  Filled 2014-09-12 (×3): qty 1

## 2014-09-12 MED ORDER — CEPHALEXIN 500 MG PO CAPS
500.0000 mg | ORAL_CAPSULE | Freq: Four times a day (QID) | ORAL | Status: DC
  Administered 2014-09-12: 500 mg via ORAL
  Filled 2014-09-12 (×4): qty 1

## 2014-09-12 MED ORDER — CEPHALEXIN 500 MG PO CAPS: 500.0000 mg | ORAL_CAPSULE | Freq: Three times a day (TID) | ORAL | Status: DC

## 2014-09-12 MED ORDER — FLAVOXATE HCL 100 MG PO TABS: 100.0000 mg | ORAL_TABLET | Freq: Three times a day (TID) | ORAL | Status: DC | PRN

## 2014-09-12 NOTE — Progress Notes (Signed)
Pt provided with discharge instruction including information on follow up appointments and new medications. Pt provided with prescriptions and received a dose of [place] before leaving. Both pt and wife verbalized understanding of all information. IV dc'd with no complication. VSS. Pt escorted out via wheelchair by volunteer services.   Tyna Jaksch, RN

## 2014-09-12 NOTE — Discharge Summary (Signed)
Physician Discharge Summary   Patient ID: Ronald [name] MRN: [id number] DOB/AGE: 07/21/1927 81 y.o.  Admit date: 09/09/2014 Discharge date: 09/12/2014  Primary Care Physician:  Nyoka Cowden, MD  Discharge Diagnoses:    . Escherichia coli UTI (lower urinary tract infection) . Escherichia coli bacteremia  . HTN (hypertension) . Chronic systolic CHF (congestive heart failure)  Consults:  Infectious disease, Dr. Linus Salmons   Recommendations for Outpatient Follow-up:  Follow CBC, BMET    DIET: Heart healthy diet    Allergies:   Allergies  Allergen Reactions  . Pantoprazole Other (See Comments)    Headache and lightheaded  . Ciprofloxacin Swelling and Hives    Allergy classified as swelling here, but pt says Cipro just doesn't work for him    . Nitrofurantoin Hives  . Sulfa Antibiotics Hives and Itching     Discharge Medications:   Medication List    TAKE these medications        acetaminophen 500 MG tablet  Commonly known as:  TYLENOL  Take 500 mg by mouth every 6 (six) hours as needed for pain or fever. Take 1 -2 pills     aspirin 81 MG EC tablet  Take 1 tablet (81 mg total) by mouth daily.     atorvastatin 80 MG tablet  Commonly known as:  LIPITOR  Take 40 mg by mouth every evening.     CALCIUM + D PO  Take 1 tablet by mouth 2 (two) times daily.     cefUROXime 500 MG tablet  Commonly known as:  CEFTIN  Take 1 tablet (500 mg total) by mouth 2 (two) times daily with a meal. x11 days     flavoxATE 100 MG tablet  Commonly known as:  URISPAS  Take 1 tablet (100 mg total) by mouth 3 (three) times daily as needed for bladder spasms.     galantamine 8 MG 24 hr capsule  Commonly known as:  RAZADYNE ER  Take 8 mg by mouth daily with breakfast.     HYDROcodone-acetaminophen 5-325 MG per tablet  Commonly known as:  NORCO/VICODIN  Take 1 tablet by mouth every 6 (six) hours as needed for moderate pain.     hydroxypropyl methylcellulose / hypromellose  2.5 % ophthalmic solution  Commonly known as:  ISOPTO TEARS / GONIOVISC  Place 1 drop into both eyes 2 (two) times daily.     ibuprofen 200 MG tablet  Commonly known as:  ADVIL,MOTRIN  Take 200 mg by mouth every 6 (six) hours as needed.     isosorbide mononitrate 60 MG 24 hr tablet  Commonly known as:  IMDUR  Take 0.5 tablets (30 mg total) by mouth every morning.     multivitamin with minerals Tabs tablet  Take 1 tablet by mouth daily.     nitroGLYCERIN 0.4 MG SL tablet  Commonly known as:  NITROSTAT  Place 0.4 mg under the tongue every 5 (five) minutes as needed for chest pain.     ranitidine 150 MG tablet  Commonly known as:  ZANTAC  Take 150 mg by mouth as needed for heartburn.     RAPAFLO 8 MG Caps capsule  Generic drug:  silodosin  Take 8 mg by mouth every evening.     URELLE 81 MG Tabs tablet  Take 1 tablet (81 mg total) by mouth every 6 (six) hours as needed for bladder spasms.     Vitamin D 2000 UNITS tablet  Take 2,000 Units by mouth daily.  Brief H and P: For complete details please refer to admission H and P, but in brief Patient is a 81 year old male with prostate cancer, radiation seed implants in 98, hyperlipidemia, CAD, presented to ED with 2 day history of intermittent weakness and dizziness, fevers. He had a fever of 102 with chills. Patient fell to his knees in the bathroom about 5 PM in the evening on the day of admission. Patient had 2 episodes of UTI both with sepsis last year.   Hospital Course:   UTI (lower urinary tract infection) with Escherichia coli bacteremia likely due to UTI Urine culture and blood cultures showed Escherichia coli. Patient was initially placed on IV Rocephin. He was transitioned to Ceftin for 11 more days to cover for bacteremia. This was discussed with infectious disease, Dr. Linus Salmons who agreed with the current management    HTN (hypertension) - Currently stable  BPH Continue Flomax  Hyperlipidemia Continue  Lipitor  Essential hypertension Currently stable, continue Imdur  CAD: No chest pain or shortness of breath Continue aspirin, Lipitor, Imdur  Dizziness resolved after IV fluid hydration - PT evaluation recommended home PT, patient declined, rolling walker was provided  Day of Discharge BP 140/56 mmHg  Pulse 55  Temp(Src) 98.4 F (36.9 C) (Oral)  Resp 18  Ht 5\' 5"  (1.651 m)  Wt 74.571 kg (164 lb 6.4 oz)  BMI 27.36 kg/m2  SpO2 95%  Physical Exam: General: Alert and awake oriented x3 not in any acute distress. HEENT: anicteric sclera, pupils reactive to light and accommodation CVS: S1-S2 clear no murmur rubs or gallops Chest: clear to auscultation bilaterally, no wheezing rales or rhonchi Abdomen: soft nontender, nondistended, normal bowel sounds Extremities: no cyanosis, clubbing or edema noted bilaterally Neuro: Cranial nerves II-XII intact, no focal neurological deficits   The results of significant diagnostics from this hospitalization (including imaging, microbiology, ancillary and laboratory) are listed below for reference.    LAB RESULTS: Basic Metabolic Panel:  Recent Labs Lab 09/11/14 0655 09/12/14 0443  NA 138 138  K 4.6 3.8  CL 103 105  CO2 25 25  GLUCOSE 117* 118*  BUN 22* 20  CREATININE 1.13 0.99  CALCIUM 8.8* 8.7*   Liver Function Tests: No results for input(s): AST, ALT, ALKPHOS, BILITOT, PROT, ALBUMIN in the last 168 hours. No results for input(s): LIPASE, AMYLASE in the last 168 hours. No results for input(s): AMMONIA in the last 168 hours. CBC:  Recent Labs Lab 09/11/14 0655 09/12/14 0443  WBC 6.5 4.2  HGB 13.7 12.9*  HCT 41.3 39.1  MCV 91.8 92.0  PLT 95* 94*   Cardiac Enzymes: No results for input(s): CKTOTAL, CKMB, CKMBINDEX, TROPONINI in the last 168 hours. BNP: Invalid input(s): POCBNP CBG: No results for input(s): GLUCAP in the last 168 hours.  Significant Diagnostic Studies:  No results found.  2D ECHO:   Disposition  and Follow-up: Discharge Instructions    Diet - low sodium heart healthy    Complete by:  As directed      Increase activity slowly    Complete by:  As directed             DISPOSITION: Home   DISCHARGE FOLLOW-UP Follow-up Information    Follow up with Nyoka Cowden, MD. Schedule an appointment as soon as possible for a visit in 10 days.   Specialty:  Internal Medicine   Why:  for hospital follow-up   Contact information:   [address] [place] [id number] [phone]  Time spent on Discharge: 25 minutes  Signed:   Belladonna Lubinski M.D. [facility] 09/12/2014, 12:52 PM Pager: [phone]

## 2014-09-12 NOTE — Care Management Note (Signed)
Case Management Note  Patient Details  Name: JIMMY PLESSINGER MRN: [id number] Date of Birth: 1928-03-22  Subjective/Objective:                 CM following for progression and d/c planning.   Action/Plan:  09/12/2014 Met with pt and wife, no d/c needs identified. Pt has cane and states that he does not need a walker at this time. Plans to return to home.  Expected Discharge Date:  09/12/2014              Expected Discharge Plan:  Home/Self Care  In-House Referral:  NA  Discharge planning Services  NA  Post Acute Care Choice:  NA Choice offered to:  NA  DME Arranged:    DME Agency:     HH Arranged:    HH Agency:     Status of Service:  Completed, signed off  Medicare Important Message Given:  Yes Date Medicare IM Given:  09/12/14 Medicare IM give by:  Jasmine Pang RN MPH, case manager, [phone] Date Additional Medicare IM Given:    Additional Medicare Important Message give by:     If discussed at [place] of Stay Meetings, dates discussed:    Additional Comments:  Adron Bene, RN 09/12/2014, 2:33 PM

## 2014-09-16 LAB — CULTURE, BLOOD (ROUTINE X 2): Culture: NO GROWTH

## 2014-09-23 ENCOUNTER — Encounter: Payer: Self-pay | Admitting: Internal Medicine

## 2014-09-23 ENCOUNTER — Ambulatory Visit (INDEPENDENT_AMBULATORY_CARE_PROVIDER_SITE_OTHER): Payer: Medicare Other | Admitting: Internal Medicine

## 2014-09-23 VITALS — BP 128/68 | HR 52 | Temp 97.9°F | Resp 18 | Ht 65.0 in | Wt 163.0 lb

## 2014-09-23 DIAGNOSIS — R7881 Bacteremia: Secondary | ICD-10-CM | POA: Diagnosis not present

## 2014-09-23 DIAGNOSIS — I5022 Chronic systolic (congestive) heart failure: Secondary | ICD-10-CM | POA: Diagnosis not present

## 2014-09-23 DIAGNOSIS — I2510 Atherosclerotic heart disease of native coronary artery without angina pectoris: Secondary | ICD-10-CM | POA: Diagnosis not present

## 2014-09-23 NOTE — Progress Notes (Signed)
Subjective:    Patient ID: Ronald [name], male    DOB: 03-24-1928, 81 y.o.   MRN: [id number]  HPI Admit date: 09/09/2014 Discharge date: 09/12/2014  Primary Care Physician: Nyoka Cowden, MD  Discharge Diagnoses:   . Escherichia coli UTI (lower urinary tract infection) . Escherichia coli bacteremia  . HTN (hypertension) . Chronic systolic CHF (congestive heart failure)  Consults: Infectious disease, Dr. Linus Salmons   Recommendations for Outpatient Follow-up:  Follow CBC, BMET    DIET: Heart healthy diet  81 year old patient who is seen following a recent hospital discharge 11 days ago.  He was admitted for an Escherichia coli UTI with bacteremia and sepsis syndrome.  He has treated hypertension, osteoarthritis and history of prostate cancer.  He has been seen by urology and ID.  Yesterday he completed antibiotic therapy.  He has a history of chronic systolic heart failure, which has been stable.  Remains slightly weak, but is close to being back to baseline.  No fever or chills.  Off antibiotic therapy  Past Medical History  Diagnosis Date  . Hyperlipidemia   . S/P CABG (coronary artery bypass graft)     [place]  . History of Bell's palsy     RIGHT SIDE-- NO RESIDUAL  . Gross hematuria   . Urethral stricture   . OAB (overactive bladder)   . Chronic cystitis   . Ischemic cardiomyopathy     ef 35-40% per cath 08-28-2013  . Coronary artery disease CARDIOLOGIST-  DR Grace Bushy  MI -- S/P  11/89 CABG x6 (70% circ; 90% PD; 60-70% distal left main; 30% left circ; 90% 1st diag;, 2nd diag and 3rd diag. w/90%,  mild stenosis LAD and 60-70% pLAD)  Re-do CABG x5 in 1997  . History of nonmelanoma skin cancer     excision's from scalp  . Wears hearing aid     bilateral  . Wears partial dentures   . At risk for sleep apnea     STOP-BANG= 4    SENT TO PCP 12-23-2013  . GERD (gastroesophageal reflux disease)   . Mild dementia   . Kidney stones "years  ago"    "passed them"  . Hypertension     "not anymore" (01/02/2014)  . Myocardial infarction 1986; 1997  . Arthritis     "joints ache" (01/02/2014)  . Prostate cancer 1998    S/P  RADIACTIVE SEED IMPLANTS; UROLOGIST-  DR GRAPEY  . Melanoma of ear     "right"    History   Social History  . Marital Status: Married    Spouse Name: N/A  . Number of Children: N/A  . Years of Education: N/A   Occupational History  . Not on file.   Social History Main Topics  . Smoking status: Former Smoker -- 40 years    Types: Cigars    Quit date: 12/24/1983  . Smokeless tobacco: Former Systems developer    Types: Chew    Quit date: 08/28/2013  . Alcohol Use: No  . Drug Use: No  . Sexual Activity: Not on file   Other Topics Concern  . Not on file   Social History Narrative    Past Surgical History  Procedure Laterality Date  . Eus  10/05/2011    Procedure: ESOPHAGEAL ENDOSCOPIC ULTRASOUND (EUS) RADIAL;  Surgeon: Arta Silence, MD;  Location: [facility];  Service: Endoscopy;  Laterality: N/A;  . Tonsillectomy and adenoidectomy  1954  .  Laparoscopic cholecystectomy  12-23-2005  . Radioactive prostate seed implants  1998  . Coronary artery bypass graft  11/ 1989  &  10/ 1997    1989-- 6 vessel/  1997 Re-do 5 vessel  . Cardiovascular stress test  08-01-2011  dr Angelena Form    inferior scar and possible soft tissue attenuation with minimal peri-infarct ischemia, small region of anterior ischemia and scar/  LVEF 53% LV wall motion with inferior hypokinesis/ no significant change from scan july 2011  . Cataract extraction w/ intraocular lens  implant, bilateral Bilateral 2007  . Cystoscopy with urethral dilatation N/A 12/25/2013    Procedure: CYSTOSCOPY WITH URETHRAL DILATATION, WITH BIOPSY;  Surgeon: Bernestine Amass, MD;  Location[facility];  Service: Urology;  Laterality: N/A;  . Melanoma excision  X 1    "ear"  . Cardiac catheterization  02-22-2005  dr Vidal Schwalbe    mild to moderate lv  dysfunction with inferobasilar akinesis/  totally occluded SVG to Intermediate Diagonal and SVG to PDA and RCA branches, totally occluded native coronary circulation with diffuse disease pLAD diagonal system with potentially could be ischemic/  patent SVG to OM with collaterals to dRCA and patent LIMA to LAD and diagonal systemss  . Cardiac catheterization  08-28-2013  DR Daneen Schick    widely patent sequential left internal graft to the diagonal/LAD, widely patent SVG to OM with proximal 50% narrowing noted in the graft, total occlusion SVG's to RCA, RI, and the Diagonal/ LV dysfunction with inferobasal aneurysm and mid anterior wall region of akinesis/  overall EF 35-40%/  Total occlusion of the navtive circulation/  no significant change compared to 2006 cath  . Skin cancer excision  X 2    "top of head"  . Left heart catheterization with coronary/graft angiogram N/A 08/28/2013    Procedure: LEFT HEART CATHETERIZATION WITH Beatrix Fetters;  Surgeon: Sinclair Grooms, MD;  Location: [facility] CATH LAB;  Service: Cardiovascular;  Laterality: N/A;    Family History  Problem Relation Age of Onset  . Heart disease Mother   . Diabetes Father   . Heart disease Brother     Allergies  Allergen Reactions  . Pantoprazole Other (See Comments)    Headache and lightheaded  . Ciprofloxacin Swelling and Hives    Allergy classified as swelling here, but pt says Cipro just doesn't work for him    . Nitrofurantoin Hives  . Sulfa Antibiotics Hives and Itching    Current Outpatient Prescriptions on File Prior to Visit  Medication Sig Dispense Refill  . acetaminophen (TYLENOL) 500 MG tablet Take 500 mg by mouth every 6 (six) hours as needed for pain or fever. Take 1 -2 pills    . aspirin EC 81 MG EC tablet Take 1 tablet (81 mg total) by mouth daily. 30 tablet 0  . atorvastatin (LIPITOR) 80 MG tablet Take 40 mg by mouth every evening.    . Calcium Carbonate-Vitamin D (CALCIUM + D PO) Take 1 tablet by  mouth 2 (two) times daily.     . Cholecalciferol (VITAMIN D) 2000 UNITS tablet Take 2,000 Units by mouth daily.    . flavoxATE (URISPAS) 100 MG tablet Take 1 tablet (100 mg total) by mouth 3 (three) times daily as needed for bladder spasms. 60 tablet 3  . galantamine (RAZADYNE ER) 8 MG 24 hr capsule Take 8 mg by mouth daily with breakfast.    . HYDROcodone-acetaminophen (NORCO/VICODIN) 5-325 MG per tablet Take 1 tablet by mouth every  6 (six) hours as needed for moderate pain.    . hydroxypropyl methylcellulose (ISOPTO TEARS) 2.5 % ophthalmic solution Place 1 drop into both eyes 2 (two) times daily.     Marland Kitchen ibuprofen (ADVIL,MOTRIN) 200 MG tablet Take 200 mg by mouth every 6 (six) hours as needed.    . isosorbide mononitrate (IMDUR) 60 MG 24 hr tablet Take 0.5 tablets (30 mg total) by mouth every morning.    . Multiple Vitamin (MULTIVITAMIN WITH MINERALS) TABS Take 1 tablet by mouth daily.    . nitroGLYCERIN (NITROSTAT) 0.4 MG SL tablet Place 0.4 mg under the tongue every 5 (five) minutes as needed for chest pain.     . ranitidine (ZANTAC) 150 MG tablet Take 150 mg by mouth as needed for heartburn.    . silodosin (RAPAFLO) 8 MG CAPS capsule Take 8 mg by mouth every evening.     Marland Kitchen URELLE (URELLE/URISED) 81 MG TABS tablet Take 1 tablet (81 mg total) by mouth every 6 (six) hours as needed for bladder spasms. 120 each 0   No current facility-administered medications on file prior to visit.    BP 128/68 mmHg  Pulse 52  Temp(Src) 97.9 F (36.6 C) (Oral)  Resp 18  Ht 5\' 5"  (1.651 m)  Wt 163 lb (73.936 kg)  BMI 27.12 kg/m2  SpO2 97%    Review of Systems  Constitutional: Positive for fatigue. Negative for fever, chills and appetite change.  HENT: Negative for congestion, dental problem, ear pain, hearing loss, sore throat, tinnitus, trouble swallowing and voice change.   Eyes: Negative for pain, discharge and visual disturbance.  Respiratory: Negative for cough, chest tightness, wheezing and  stridor.   Cardiovascular: Negative for chest pain, palpitations and leg swelling.  Gastrointestinal: Negative for nausea, vomiting, abdominal pain, diarrhea, constipation, blood in stool and abdominal distention.  Genitourinary: Negative for urgency, hematuria, flank pain, discharge, difficulty urinating and genital sores.  Musculoskeletal: Positive for back pain. Negative for myalgias, joint swelling, arthralgias, gait problem and neck stiffness.  Skin: Negative for rash.  Neurological: Positive for weakness. Negative for dizziness, syncope, speech difficulty, numbness and headaches.  Hematological: Negative for adenopathy. Does not bruise/bleed easily.  Psychiatric/Behavioral: Negative for behavioral problems and dysphoric mood. The patient is not nervous/anxious.        Objective:   Physical Exam  Constitutional: He is oriented to person, place, and time. He appears well-developed.  HENT:  Head: Normocephalic.  Right Ear: External ear normal.  Left Ear: External ear normal.  Eyes: Conjunctivae and EOM are normal.  Neck: Normal range of motion.  Cardiovascular: Normal rate and normal heart sounds.   Pulmonary/Chest: Breath sounds normal.  Abdominal: Bowel sounds are normal. He exhibits no distension. There is no tenderness. There is no rebound and no guarding.  Musculoskeletal: Normal range of motion. He exhibits no edema or tenderness.  Neurological: He is alert and oriented to person, place, and time.  Psychiatric: He has a normal mood and affect. His behavior is normal.          Assessment & Plan:   Status post Escherichia coli bacteremia Hypertension, stable Mild posthospitalization.  Deconditioning History of chronic systolic heart failure, compensated CAD stable History of prostate cancer  Patient has completed antibiotic therapy We'll recheck 3 months He will report any weakness, fever or chills

## 2014-09-23 NOTE — Patient Instructions (Signed)
Limit your sodium (Salt) intake  Return in 4 months for follow-up  

## 2014-09-23 NOTE — Progress Notes (Signed)
Pre visit review using our clinic review tool, if applicable. No additional management support is needed unless otherwise documented below in the visit note. 

## 2014-10-03 ENCOUNTER — Encounter (HOSPITAL_COMMUNITY): Payer: Self-pay | Admitting: Emergency Medicine

## 2014-10-03 ENCOUNTER — Inpatient Hospital Stay (HOSPITAL_COMMUNITY)
Admission: EM | Admit: 2014-10-03 | Discharge: 2014-10-06 | DRG: 690 | Disposition: A | Discharge status: Home / Self Care | Payer: Medicare Other | Attending: Internal Medicine | Admitting: Internal Medicine

## 2014-10-03 ENCOUNTER — Observation Stay (HOSPITAL_COMMUNITY): Payer: Medicare Other

## 2014-10-03 DIAGNOSIS — N3000 Acute cystitis without hematuria: Secondary | ICD-10-CM

## 2014-10-03 DIAGNOSIS — R509 Fever, unspecified: Secondary | ICD-10-CM | POA: Diagnosis present

## 2014-10-03 DIAGNOSIS — Z87891 Personal history of nicotine dependence: Secondary | ICD-10-CM

## 2014-10-03 DIAGNOSIS — D696 Thrombocytopenia, unspecified: Secondary | ICD-10-CM | POA: Diagnosis not present

## 2014-10-03 DIAGNOSIS — I2510 Atherosclerotic heart disease of native coronary artery without angina pectoris: Secondary | ICD-10-CM | POA: Diagnosis present

## 2014-10-03 DIAGNOSIS — B9689 Other specified bacterial agents as the cause of diseases classified elsewhere: Secondary | ICD-10-CM | POA: Diagnosis present

## 2014-10-03 DIAGNOSIS — N4281 Prostatodynia syndrome: Secondary | ICD-10-CM

## 2014-10-03 DIAGNOSIS — N390 Urinary tract infection, site not specified: Principal | ICD-10-CM | POA: Diagnosis present

## 2014-10-03 DIAGNOSIS — Z951 Presence of aortocoronary bypass graft: Secondary | ICD-10-CM

## 2014-10-03 DIAGNOSIS — Z9842 Cataract extraction status, left eye: Secondary | ICD-10-CM

## 2014-10-03 DIAGNOSIS — E7800 Pure hypercholesterolemia, unspecified: Secondary | ICD-10-CM | POA: Diagnosis present

## 2014-10-03 DIAGNOSIS — Z881 Allergy status to other antibiotic agents status: Secondary | ICD-10-CM

## 2014-10-03 DIAGNOSIS — F0390 Unspecified dementia without behavioral disturbance: Secondary | ICD-10-CM | POA: Diagnosis not present

## 2014-10-03 DIAGNOSIS — I5022 Chronic systolic (congestive) heart failure: Secondary | ICD-10-CM | POA: Diagnosis not present

## 2014-10-03 DIAGNOSIS — I10 Essential (primary) hypertension: Secondary | ICD-10-CM

## 2014-10-03 DIAGNOSIS — N179 Acute kidney failure, unspecified: Secondary | ICD-10-CM | POA: Diagnosis not present

## 2014-10-03 DIAGNOSIS — Z8546 Personal history of malignant neoplasm of prostate: Secondary | ICD-10-CM

## 2014-10-03 DIAGNOSIS — R0602 Shortness of breath: Secondary | ICD-10-CM | POA: Diagnosis not present

## 2014-10-03 DIAGNOSIS — I252 Old myocardial infarction: Secondary | ICD-10-CM

## 2014-10-03 DIAGNOSIS — K219 Gastro-esophageal reflux disease without esophagitis: Secondary | ICD-10-CM | POA: Diagnosis present

## 2014-10-03 DIAGNOSIS — G510 Bell's palsy: Secondary | ICD-10-CM | POA: Diagnosis present

## 2014-10-03 DIAGNOSIS — Z961 Presence of intraocular lens: Secondary | ICD-10-CM | POA: Diagnosis present

## 2014-10-03 DIAGNOSIS — Z9841 Cataract extraction status, right eye: Secondary | ICD-10-CM

## 2014-10-03 DIAGNOSIS — Z7982 Long term (current) use of aspirin: Secondary | ICD-10-CM

## 2014-10-03 DIAGNOSIS — Z85820 Personal history of malignant melanoma of skin: Secondary | ICD-10-CM

## 2014-10-03 DIAGNOSIS — I255 Ischemic cardiomyopathy: Secondary | ICD-10-CM | POA: Diagnosis present

## 2014-10-03 DIAGNOSIS — Z79891 Long term (current) use of opiate analgesic: Secondary | ICD-10-CM

## 2014-10-03 DIAGNOSIS — E785 Hyperlipidemia, unspecified: Secondary | ICD-10-CM | POA: Diagnosis present

## 2014-10-03 DIAGNOSIS — M1990 Unspecified osteoarthritis, unspecified site: Secondary | ICD-10-CM | POA: Diagnosis present

## 2014-10-03 DIAGNOSIS — Z888 Allergy status to other drugs, medicaments and biological substances status: Secondary | ICD-10-CM

## 2014-10-03 DIAGNOSIS — Z882 Allergy status to sulfonamides status: Secondary | ICD-10-CM

## 2014-10-03 DIAGNOSIS — T83510A Infection and inflammatory reaction due to cystostomy catheter, initial encounter: Secondary | ICD-10-CM

## 2014-10-03 LAB — CBC WITH DIFFERENTIAL/PLATELET
Basophils Absolute: 0 10*3/uL (ref 0.0–0.1)
Basophils Relative: 0 % (ref 0–1)
Eosinophils Absolute: 0.1 10*3/uL (ref 0.0–0.7)
Eosinophils Relative: 1 % (ref 0–5)
HCT: 44.2 % (ref 39.0–52.0)
Hemoglobin: 14.4 g/dL (ref 13.0–17.0)
Lymphocytes Relative: 3 % — ABNORMAL LOW (ref 12–46)
Lymphs Abs: 0.4 10*3/uL — ABNORMAL LOW (ref 0.7–4.0)
MCH: 30.3 pg (ref 26.0–34.0)
MCHC: 32.6 g/dL (ref 30.0–36.0)
MCV: 93.1 fL (ref 78.0–100.0)
Monocytes Absolute: 0.7 10*3/uL (ref 0.1–1.0)
Monocytes Relative: 6 % (ref 3–12)
Neutro Abs: 11.6 10*3/uL — ABNORMAL HIGH (ref 1.7–7.7)
Neutrophils Relative %: 90 % — ABNORMAL HIGH (ref 43–77)
Platelets: 84 10*3/uL — ABNORMAL LOW (ref 150–400)
RBC: 4.75 MIL/uL (ref 4.22–5.81)
RDW: 13.7 % (ref 11.5–15.5)
WBC: 12.9 10*3/uL — ABNORMAL HIGH (ref 4.0–10.5)

## 2014-10-03 LAB — URINALYSIS, ROUTINE W REFLEX MICROSCOPIC
Bilirubin Urine: NEGATIVE
Glucose, UA: NEGATIVE mg/dL
Ketones, ur: NEGATIVE mg/dL
Nitrite: NEGATIVE
Protein, ur: 100 mg/dL — AB
Specific Gravity, Urine: 1.018 (ref 1.005–1.030)
Urobilinogen, UA: 0.2 mg/dL (ref 0.0–1.0)
pH: 5 (ref 5.0–8.0)

## 2014-10-03 LAB — I-STAT CG4 LACTIC ACID, ED: Lactic Acid, Venous: 1.34 mmol/L (ref 0.5–2.0)

## 2014-10-03 LAB — BASIC METABOLIC PANEL
Anion gap: 8 (ref 5–15)
BUN: 27 mg/dL — ABNORMAL HIGH (ref 6–20)
CO2: 25 mmol/L (ref 22–32)
Calcium: 9.6 mg/dL (ref 8.9–10.3)
Chloride: 106 mmol/L (ref 101–111)
Creatinine, Ser: 1.24 mg/dL (ref 0.61–1.24)
GFR calc Af Amer: 59 mL/min — ABNORMAL LOW (ref >60)
GFR calc non Af Amer: 51 mL/min — ABNORMAL LOW (ref >60)
Glucose, Bld: 176 mg/dL — ABNORMAL HIGH (ref 65–99)
Potassium: 4 mmol/L (ref 3.5–5.1)
Sodium: 139 mmol/L (ref 135–145)

## 2014-10-03 LAB — URINE MICROSCOPIC-ADD ON

## 2014-10-03 MED ORDER — ACETAMINOPHEN 500 MG PO TABS
1000.0000 mg | ORAL_TABLET | Freq: Once | ORAL | Status: AC
  Administered 2014-10-03: 1000 mg via ORAL
  Filled 2014-10-03: qty 2

## 2014-10-03 MED ORDER — GALANTAMINE HYDROBROMIDE ER 8 MG PO CP24
8.0000 mg | ORAL_CAPSULE | Freq: Every day | ORAL | Status: DC
  Administered 2014-10-04 – 2014-10-06 (×3): 8 mg via ORAL
  Filled 2014-10-03 (×4): qty 1

## 2014-10-03 MED ORDER — ACETAMINOPHEN 500 MG PO TABS: 500.0000 mg | ORAL_TABLET | Freq: Four times a day (QID) | ORAL | Status: DC | PRN

## 2014-10-03 MED ORDER — ASPIRIN EC 81 MG PO TBEC
81.0000 mg | DELAYED_RELEASE_TABLET | Freq: Every day | ORAL | Status: DC
  Administered 2014-10-04 – 2014-10-06 (×3): 81 mg via ORAL
  Filled 2014-10-03 (×3): qty 1

## 2014-10-03 MED ORDER — ENOXAPARIN SODIUM 40 MG/0.4ML ~~LOC~~ SOLN: 40.0000 mg | SUBCUTANEOUS | Status: DC

## 2014-10-03 MED ORDER — SODIUM CHLORIDE 0.9 % IJ SOLN
3.0000 mL | Freq: Two times a day (BID) | INTRAMUSCULAR | Status: DC
  Administered 2014-10-03 – 2014-10-06 (×4): 3 mL via INTRAVENOUS

## 2014-10-03 MED ORDER — VITAMIN D 1000 UNITS PO TABS
2000.0000 [IU] | ORAL_TABLET | Freq: Every day | ORAL | Status: DC
  Administered 2014-10-03 – 2014-10-06 (×4): 2000 [IU] via ORAL
  Filled 2014-10-03 (×4): qty 2

## 2014-10-03 MED ORDER — DEXTROSE 5 % IV SOLN
1.0000 g | INTRAVENOUS | Status: DC
  Administered 2014-10-04: 1 g via INTRAVENOUS
  Filled 2014-10-03: qty 10

## 2014-10-03 MED ORDER — ISOSORBIDE MONONITRATE ER 30 MG PO TB24
30.0000 mg | ORAL_TABLET | Freq: Every morning | ORAL | Status: DC
  Administered 2014-10-04 – 2014-10-06 (×3): 30 mg via ORAL
  Filled 2014-10-03 (×3): qty 1

## 2014-10-03 MED ORDER — URELLE 81 MG PO TABS
1.0000 | ORAL_TABLET | Freq: Four times a day (QID) | ORAL | Status: DC | PRN
  Filled 2014-10-03: qty 1

## 2014-10-03 MED ORDER — ADULT MULTIVITAMIN W/MINERALS CH
1.0000 | ORAL_TABLET | Freq: Every day | ORAL | Status: DC
  Administered 2014-10-04 – 2014-10-06 (×3): 1 via ORAL
  Filled 2014-10-03 (×3): qty 1

## 2014-10-03 MED ORDER — ENOXAPARIN SODIUM 40 MG/0.4ML ~~LOC~~ SOLN
40.0000 mg | SUBCUTANEOUS | Status: DC
  Administered 2014-10-03 – 2014-10-05 (×3): 40 mg via SUBCUTANEOUS
  Filled 2014-10-03 (×4): qty 0.4

## 2014-10-03 MED ORDER — IBUPROFEN 400 MG PO TABS: 200.0000 mg | ORAL_TABLET | Freq: Four times a day (QID) | ORAL | Status: DC | PRN

## 2014-10-03 MED ORDER — FAMOTIDINE 20 MG PO TABS: 20.0000 mg | ORAL_TABLET | Freq: Two times a day (BID) | ORAL | Status: DC | PRN

## 2014-10-03 MED ORDER — FLAVOXATE HCL 100 MG PO TABS
100.0000 mg | ORAL_TABLET | Freq: Three times a day (TID) | ORAL | Status: DC | PRN
  Filled 2014-10-03: qty 1

## 2014-10-03 MED ORDER — ATORVASTATIN CALCIUM 40 MG PO TABS
40.0000 mg | ORAL_TABLET | Freq: Every evening | ORAL | Status: DC
Start: 2014-10-03 — End: 2014-10-06
  Administered 2014-10-03 – 2014-10-05 (×3): 40 mg via ORAL
  Filled 2014-10-03 (×4): qty 1

## 2014-10-03 MED ORDER — POLYVINYL ALCOHOL 1.4 % OP SOLN
1.0000 [drp] | Freq: Two times a day (BID) | OPHTHALMIC | Status: DC
  Administered 2014-10-03 – 2014-10-06 (×6): 1 [drp] via OPHTHALMIC
  Filled 2014-10-03 (×2): qty 15

## 2014-10-03 MED ORDER — HYPROMELLOSE (GONIOSCOPIC) 2.5 % OP SOLN: 1.0000 [drp] | Freq: Two times a day (BID) | OPHTHALMIC | Status: DC

## 2014-10-03 MED ORDER — HYDROCODONE-ACETAMINOPHEN 5-325 MG PO TABS
1.0000 | ORAL_TABLET | Freq: Four times a day (QID) | ORAL | Status: DC | PRN
  Administered 2014-10-04 – 2014-10-06 (×4): 1 via ORAL
  Filled 2014-10-03 (×4): qty 1

## 2014-10-03 MED ORDER — DEXTROSE 5 % IV SOLN
1.0000 g | Freq: Once | INTRAVENOUS | Status: AC
  Administered 2014-10-03: 1 g via INTRAVENOUS
  Filled 2014-10-03: qty 10

## 2014-10-03 NOTE — ED Notes (Signed)
Pt with hx of sepsis with ecoli in "blood stream" per family; pt with similar sx of body aches, chills and fever x several days and dysuria

## 2014-10-03 NOTE — H&P (Signed)
History and Physical    Ronald [name] [id number] DOB: Nov 24, 1927 DOA: 10/03/2014  Referring physician: Waynetta Pean, PA PCP: Nyoka Cowden, MD  Specialists: none   Chief Complaint: fevers, weakness  HPI: Ronald [name] is a 81 y.o. male has a past medical history significant for hyperlipidemia, hypertension, coronary artery disease, history of ischemic cardiomyopathy and chronic systolic heart failure with an ejection fraction of 45-50%, recurrent urinary tract infections, history of prostate cancer, presents to the emergency room with a chief complaint of fever, weakness, dysuria, progressively worsened for the past 3 days. Patient has a history of recurrent urinary tract infections, and he was hospitalized just a month ago where he had Escherichia coli bacteremia. Since his discharge, he has been doing well, up until 3 days ago. He denies any chest pains, denies any shortness of breath, he denies any abdominal pain, nausea, vomiting or diarrhea. He has remained pretty active and is still driving. He denies any lightheadedness or dizziness. He just feels "weak". He hasn't been able to do his normal things around the house. He is also complaining of generalized body aches. In the emergency room, patient was found to be febrile to 101, he is not tachycardic nor hypotensive, he was found to have a leukocytosis of 12.9, his lactic acid is normal and the urinalysis is clearly positive for UTI. [facility] was asked for admission for urinary tract infection.  Review of Systems: As per history of present illness, otherwise 10 point review of system negative   Past Medical History  Diagnosis Date  . Hyperlipidemia   . S/P CABG (coronary artery bypass graft)     [place]  . History of Bell's palsy     RIGHT SIDE-- NO RESIDUAL  . Gross hematuria   . Urethral stricture   . OAB (overactive bladder)   . Chronic cystitis   . Ischemic cardiomyopathy     ef 35-40% per cath  08-28-2013  . Coronary artery disease CARDIOLOGIST-  DR Grace Bushy  MI -- S/P  11/89 CABG x6 (70% circ; 90% PD; 60-70% distal left main; 30% left circ; 90% 1st diag;, 2nd diag and 3rd diag. w/90%,  mild stenosis LAD and 60-70% pLAD)  Re-do CABG x5 in 1997  . History of nonmelanoma skin cancer     excision's from scalp  . Wears hearing aid     bilateral  . Wears partial dentures   . At risk for sleep apnea     STOP-BANG= 4    SENT TO PCP 12-23-2013  . GERD (gastroesophageal reflux disease)   . Mild dementia   . Kidney stones "years ago"    "passed them"  . Hypertension     "not anymore" (01/02/2014)  . Myocardial infarction 1986; 1997  . Arthritis     "joints ache" (01/02/2014)  . Prostate cancer 1998    S/P  RADIACTIVE SEED IMPLANTS; UROLOGIST-  DR GRAPEY  . Melanoma of ear     "right"   Past Surgical History  Procedure Laterality Date  . Eus  10/05/2011    Procedure: ESOPHAGEAL ENDOSCOPIC ULTRASOUND (EUS) RADIAL;  Surgeon: Arta Silence, MD;  Location: [facility];  Service: Endoscopy;  Laterality: N/A;  . Tonsillectomy and adenoidectomy  1954  . Laparoscopic cholecystectomy  12-23-2005  . Radioactive prostate seed implants  1998  . Coronary artery bypass graft  11/ 1989  &  10/ 1997    1989-- 6 vessel/  1997 Re-do 5 vessel  . Cardiovascular stress test  08-01-2011  dr Angelena Form    inferior scar and possible soft tissue attenuation with minimal peri-infarct ischemia, small region of anterior ischemia and scar/  LVEF 53% LV wall motion with inferior hypokinesis/ no significant change from scan july 2011  . Cataract extraction w/ intraocular lens  implant, bilateral Bilateral 2007  . Cystoscopy with urethral dilatation N/A 12/25/2013    Procedure: CYSTOSCOPY WITH URETHRAL DILATATION, WITH BIOPSY;  Surgeon: Bernestine Amass, MD;  Location: [facility];  Service: Urology;  Laterality: N/A;  . Melanoma excision  X 1    "ear"  . Cardiac catheterization  02-22-2005  dr  Vidal Schwalbe    mild to moderate lv dysfunction with inferobasilar akinesis/  totally occluded SVG to Intermediate Diagonal and SVG to PDA and RCA branches, totally occluded native coronary circulation with diffuse disease pLAD diagonal system with potentially could be ischemic/  patent SVG to OM with collaterals to dRCA and patent LIMA to LAD and diagonal systemss  . Cardiac catheterization  08-28-2013  DR Daneen Schick    widely patent sequential left internal graft to the diagonal/LAD, widely patent SVG to OM with proximal 50% narrowing noted in the graft, total occlusion SVG's to RCA, RI, and the Diagonal/ LV dysfunction with inferobasal aneurysm and mid anterior wall region of akinesis/  overall EF 35-40%/  Total occlusion of the navtive circulation/  no significant change compared to 2006 cath  . Skin cancer excision  X 2    "top of head"  . Left heart catheterization with coronary/graft angiogram N/A 08/28/2013    Procedure: LEFT HEART CATHETERIZATION WITH Beatrix Fetters;  Surgeon: Sinclair Grooms, MD;  Location[facility] LAB;  Service: Cardiovascular;  Laterality: N/A;   Social History:  reports that he quit smoking about 30 years ago. His smoking use included Cigars. He quit smokeless tobacco use about 13 months ago. His smokeless tobacco use included Chew. He reports that he does not drink alcohol or use illicit drugs.  Allergies  Allergen Reactions  . Pantoprazole Other (See Comments)    Headache and lightheaded  . Ciprofloxacin Swelling and Hives    Allergy classified as swelling here, but pt says Cipro just doesn't work for him    . Nitrofurantoin Hives  . Sulfa Antibiotics Hives and Itching    Family History  Problem Relation Age of Onset  . Heart disease Mother   . Diabetes Father   . Heart disease Brother     Prior to Admission medications   Medication Sig Start Date End Date Taking? Authorizing Provider  acetaminophen (TYLENOL) 500 MG tablet Take 500 mg by mouth  every 6 (six) hours as needed for pain or fever. Take 1 -2 pills    Historical Provider, MD  aspirin EC 81 MG EC tablet Take 1 tablet (81 mg total) by mouth daily. 08/29/13   Brittainy Erie Noe, PA-C  atorvastatin (LIPITOR) 80 MG tablet Take 40 mg by mouth every evening. 09/23/13   Imogene Burn, PA-C  Calcium Carbonate-Vitamin D (CALCIUM + D PO) Take 1 tablet by mouth 2 (two) times daily.     Historical Provider, MD  Cholecalciferol (VITAMIN D) 2000 UNITS tablet Take 2,000 Units by mouth daily.    Historical Provider, MD  flavoxATE (URISPAS) 100 MG tablet Take 1 tablet (100 mg total) by mouth 3 (three) times daily as needed for bladder spasms. 09/12/14   Ripudeep Krystal Eaton, MD  galantamine (  RAZADYNE ER) 8 MG 24 hr capsule Take 8 mg by mouth daily with breakfast.    Historical Provider, MD  HYDROcodone-acetaminophen (NORCO/VICODIN) 5-325 MG per tablet Take 1 tablet by mouth every 6 (six) hours as needed for moderate pain.    Historical Provider, MD  hydroxypropyl methylcellulose (ISOPTO TEARS) 2.5 % ophthalmic solution Place 1 drop into both eyes 2 (two) times daily.     Historical Provider, MD  ibuprofen (ADVIL,MOTRIN) 200 MG tablet Take 200 mg by mouth every 6 (six) hours as needed.    Historical Provider, MD  isosorbide mononitrate (IMDUR) 60 MG 24 hr tablet Take 0.5 tablets (30 mg total) by mouth every morning. 01/07/14   Shanker Kristeen Mans, MD  Multiple Vitamin (MULTIVITAMIN WITH MINERALS) TABS Take 1 tablet by mouth daily.    Historical Provider, MD  nitroGLYCERIN (NITROSTAT) 0.4 MG SL tablet Place 0.4 mg under the tongue every 5 (five) minutes as needed for chest pain.     Historical Provider, MD  ranitidine (ZANTAC) 150 MG tablet Take 150 mg by mouth as needed for heartburn.    Historical Provider, MD  silodosin (RAPAFLO) 8 MG CAPS capsule Take 8 mg by mouth every evening.     Historical Provider, MD  URELLE (URELLE/URISED) 81 MG TABS tablet Take 1 tablet (81 mg total) by mouth every 6 (six) hours  as needed for bladder spasms. 01/07/14   Shanker Kristeen Mans, MD   Physical Exam: Filed Vitals:   10/03/14 1440 10/03/14 1445 10/03/14 1530 10/03/14 1545  BP: 156/80 145/74 129/62 122/59  Pulse: 76 74 82 86  Temp:      TempSrc:      Resp: 18     SpO2: 96% 96% 93% 92%     General:  No apparent distress  Eyes: PERRL, EOMI, no scleral icterus  ENT: moist oropharynx  Neck: supple, no lymphadenopathy  Cardiovascular: regular rate without MRG; 2+ peripheral pulses, no JVD, no peripheral edema  Respiratory: CTA biL, good air movement without wheezing, rhonchi or crackled  Abdomen: soft, non tender to palpation, positive bowel sounds, no guarding, no rebound  Skin: no rashes  Musculoskeletal: normal bulk and tone, no joint swelling  Psychiatric: normal mood and affect  Neurologic: CN 2-12 grossly intact, MS 5/5 in all 4  Labs on Admission:  Basic Metabolic Panel:  Recent Labs Lab 10/03/14 1305  NA 139  K 4.0  CL 106  CO2 25  GLUCOSE 176*  BUN 27*  CREATININE 1.24  CALCIUM 9.6   CBC:  Recent Labs Lab 10/03/14 1305  WBC 12.9*  NEUTROABS 11.6*  HGB 14.4  HCT 44.2  MCV 93.1  PLT 84*   Assessment/Plan Principal Problem:   UTI (urinary tract infection) Active Problems:   HYPERCHOLESTEROLEMIA   HTN (hypertension)   Chronic systolic CHF (congestive heart failure)   Fever   UTI  - We'll admit patient, start IV ceftriaxone, based on most recent 2 microbiology data he has been growing Escherichia coli which is sensitive to ceftriaxone  - Blood cultures were obtained prior to antibody administration, closely follow results since he had recently had bacteremia  - Patient is nonseptic appearing, his lactic acid normal  Chronic systolic heart failure - Most recent 2-D echo was done in October 2015, showed an ejection fraction of 45-50% - Patient currently has no evidence of fluid overload, minimize IV fluids   CAD - no chest pain, monitor in telemetry  -  resume home medications  Thrombocytopenia  - This  is chronic, dating back as far as 2008, his platelets are close to baseline  - SCD for DVT prophylaxis  Diet: heart healthy Fluids: none DVT Prophylaxis: lovenox  Code Status: Full  Family Communication: discussed with wife bedside   Time spent: 68  Costin M. Cruzita Lederer, MD [facility] Pager [phone]  If 7PM-7AM, please contact night-coverage [email or web address] Password [email or web address] 10/03/2014, 4:18 PM

## 2014-10-03 NOTE — ED Provider Notes (Signed)
CSN: [id number]     Arrival date & time 10/03/14  1234 History   First MD Initiated Contact with Patient 10/03/14 1248     Chief Complaint  Patient presents with  . Generalized Body Aches  . Dysuria  . Chills   Ronald [name] is a 81 y.o. male with a history of coronary artery disease, prostate cancer, CABG, dementia, chronic UTIs, and hypertension who presents to the emergency department complaining of chills, subjective fever, dysuria and generalized body aches ongoing for the past 3-4 days. Patient reports he has frequent UTIs and his last UTI was 09/09/2014 for which he is admitted. He reports even feeling better after discharge until about 3-4 days ago when he started having chills and body aches with associated dysuria. He also reports feeling fatigued. He reports he has chronic frequency and urgency of urination. He denies any hematuria. He reports taking Tylenol at 3 AM this morning with little relief and ibuprofen at 11 AM this morning was a little relief. He denies having any abdominal pain currently. He denies abdominal pain, nausea, vomiting, hematuria, chest pain, cough, wheezing, shortness of breath, lightheadedness, dizziness, rashes, penile pain, testicular pain, scrotal swelling or penile discharge.  (Consider location/radiation/quality/duration/timing/severity/associated sxs/prior Treatment) HPI  Past Medical History  Diagnosis Date  . Hyperlipidemia   . S/P CABG (coronary artery bypass graft)     [place]  . History of Bell's palsy     RIGHT SIDE-- NO RESIDUAL  . Gross hematuria   . Urethral stricture   . OAB (overactive bladder)   . Chronic cystitis   . Ischemic cardiomyopathy     ef 35-40% per cath 08-28-2013  . Coronary artery disease CARDIOLOGIST-  DR Grace Bushy  [place] -- S/P  11/89 CABG x6 (70% circ; 90% PD; 60-70% distal left main; 30% left circ; 90% 1st diag;, 2nd diag and 3rd diag. w/90%,  mild stenosis LAD and 60-70% pLAD)  Re-do CABG x5 in 1997  .  History of nonmelanoma skin cancer     excision's from scalp  . Wears hearing aid     bilateral  . Wears partial dentures   . At risk for sleep apnea     STOP-BANG= 4    SENT TO PCP 12-23-2013  . GERD (gastroesophageal reflux disease)   . Mild dementia   . Kidney stones "years ago"    "passed them"  . Hypertension     "not anymore" (01/02/2014)  . Myocardial infarction 1986; 1997  . Arthritis     "joints ache" (01/02/2014)  . Prostate cancer 1998    S/P  RADIACTIVE SEED IMPLANTS; UROLOGIST-  DR GRAPEY  . Melanoma of ear     "right"   Past Surgical History  Procedure Laterality Date  . Eus  10/05/2011    Procedure: ESOPHAGEAL ENDOSCOPIC ULTRASOUND (EUS) RADIAL;  Surgeon: Arta Silence, MD;  Location: [facility];  Service: Endoscopy;  Laterality: N/A;  . Tonsillectomy and adenoidectomy  1954  . Laparoscopic cholecystectomy  12-23-2005  . Radioactive prostate seed implants  1998  . Coronary artery bypass graft  11/ 1989  &  10/ 1997    1989-- 6 vessel/  1997 Re-do 5 vessel  . Cardiovascular stress test  08-01-2011  dr Angelena Form    inferior scar and possible soft tissue attenuation with minimal peri-infarct ischemia, small region of anterior ischemia and scar/  LVEF 53% LV wall motion with inferior hypokinesis/ no significant change  from scan july 2011  . Cataract extraction w/ intraocular lens  implant, bilateral Bilateral 2007  . Cystoscopy with urethral dilatation N/A 12/25/2013    Procedure: CYSTOSCOPY WITH URETHRAL DILATATION, WITH BIOPSY;  Surgeon: Bernestine Amass, MD;  Location: [facility];  Service: Urology;  Laterality: N/A;  . Melanoma excision  X 1    "ear"  . Cardiac catheterization  02-22-2005  dr Vidal Schwalbe    mild to moderate lv dysfunction with inferobasilar akinesis/  totally occluded SVG to Intermediate Diagonal and SVG to PDA and RCA branches, totally occluded native coronary circulation with diffuse disease pLAD diagonal system with potentially could be  ischemic/  patent SVG to OM with collaterals to dRCA and patent LIMA to LAD and diagonal systemss  . Cardiac catheterization  08-28-2013  DR Daneen Schick    widely patent sequential left internal graft to the diagonal/LAD, widely patent SVG to OM with proximal 50% narrowing noted in the graft, total occlusion SVG's to RCA, RI, and the Diagonal/ LV dysfunction with inferobasal aneurysm and mid anterior wall region of akinesis/  overall EF 35-40%/  Total occlusion of the navtive circulation/  no significant change compared to 2006 cath  . Skin cancer excision  X 2    "top of head"  . Left heart catheterization with coronary/graft angiogram N/A 08/28/2013    Procedure: LEFT HEART CATHETERIZATION WITH Beatrix Fetters;  Surgeon: Sinclair Grooms, MD;  Location: [facility] CATH LAB;  Service: Cardiovascular;  Laterality: N/A;   Family History  Problem Relation Age of Onset  . Heart disease Mother   . Diabetes Father   . Heart disease Brother    History  Substance Use Topics  . Smoking status: Former Smoker -- 40 years    Types: Cigars    Quit date: 12/24/1983  . Smokeless tobacco: Former Systems developer    Types: Chew    Quit date: 08/28/2013  . Alcohol Use: No    Review of Systems  Constitutional: Positive for fever, chills and fatigue. Negative for appetite change.  HENT: Negative for congestion, sore throat and trouble swallowing.   Eyes: Negative for visual disturbance.  Respiratory: Negative for cough, shortness of breath and wheezing.   Cardiovascular: Negative for chest pain and palpitations.  Gastrointestinal: Negative for nausea, vomiting, abdominal pain and diarrhea.  Genitourinary: Positive for dysuria, urgency and frequency. Negative for hematuria, decreased urine volume, penile swelling, scrotal swelling, difficulty urinating, penile pain and testicular pain.  Musculoskeletal: Positive for myalgias. Negative for back pain and neck pain.  Skin: Negative for rash.  Neurological: Negative  for dizziness, syncope, weakness, light-headedness, numbness and headaches.      Allergies  Pantoprazole; Ciprofloxacin; Nitrofurantoin; and Sulfa antibiotics  Home Medications   Prior to Admission medications   Medication Sig Start Date End Date Taking? Authorizing Provider  acetaminophen (TYLENOL) 500 MG tablet Take 500 mg by mouth every 6 (six) hours as needed for pain or fever. Take 1 -2 pills    Historical Provider, MD  aspirin EC 81 MG EC tablet Take 1 tablet (81 mg total) by mouth daily. 08/29/13   Brittainy Erie Noe, PA-C  atorvastatin (LIPITOR) 80 MG tablet Take 40 mg by mouth every evening. 09/23/13   Imogene Burn, PA-C  Calcium Carbonate-Vitamin D (CALCIUM + D PO) Take 1 tablet by mouth 2 (two) times daily.     Historical Provider, MD  Cholecalciferol (VITAMIN D) 2000 UNITS tablet Take 2,000 Units by mouth daily.    Historical  Provider, MD  flavoxATE (URISPAS) 100 MG tablet Take 1 tablet (100 mg total) by mouth 3 (three) times daily as needed for bladder spasms. 09/12/14   Ripudeep Krystal Eaton, MD  galantamine (RAZADYNE ER) 8 MG 24 hr capsule Take 8 mg by mouth daily with breakfast.    Historical Provider, MD  HYDROcodone-acetaminophen (NORCO/VICODIN) 5-325 MG per tablet Take 1 tablet by mouth every 6 (six) hours as needed for moderate pain.    Historical Provider, MD  hydroxypropyl methylcellulose (ISOPTO TEARS) 2.5 % ophthalmic solution Place 1 drop into both eyes 2 (two) times daily.     Historical Provider, MD  ibuprofen (ADVIL,MOTRIN) 200 MG tablet Take 200 mg by mouth every 6 (six) hours as needed.    Historical Provider, MD  isosorbide mononitrate (IMDUR) 60 MG 24 hr tablet Take 0.5 tablets (30 mg total) by mouth every morning. 01/07/14   Shanker Kristeen Mans, MD  Multiple Vitamin (MULTIVITAMIN WITH MINERALS) TABS Take 1 tablet by mouth daily.    Historical Provider, MD  nitroGLYCERIN (NITROSTAT) 0.4 MG SL tablet Place 0.4 mg under the tongue every 5 (five) minutes as needed for  chest pain.     Historical Provider, MD  ranitidine (ZANTAC) 150 MG tablet Take 150 mg by mouth as needed for heartburn.    Historical Provider, MD  silodosin (RAPAFLO) 8 MG CAPS capsule Take 8 mg by mouth every evening.     Historical Provider, MD  URELLE (URELLE/URISED) 81 MG TABS tablet Take 1 tablet (81 mg total) by mouth every 6 (six) hours as needed for bladder spasms. 01/07/14   Shanker Kristeen Mans, MD   BP 122/59 mmHg  Pulse 86  Temp(Src) 101 F (38.3 C) (Rectal)  Resp 18  SpO2 92% Physical Exam  Constitutional: He appears well-developed and well-nourished. No distress.  Nontoxic appearing.  HENT:  Head: Normocephalic and atraumatic.  Mouth/Throat: Oropharynx is clear and moist. No oropharyngeal exudate.  Eyes: Conjunctivae are normal. Pupils are equal, round, and reactive to light. Right eye exhibits no discharge. Left eye exhibits no discharge.  Neck: Neck supple.  Cardiovascular: Normal rate, regular rhythm, normal heart sounds and intact distal pulses.  Exam reveals no gallop and no friction rub.   No murmur heard. Pulmonary/Chest: Effort normal and breath sounds normal. No respiratory distress. He has no wheezes. He has no rales.  Abdominal: Soft. Bowel sounds are normal. He exhibits no distension and no mass. There is no tenderness. There is no rebound and no guarding.  Abdomen is soft and nontender to palpation. Bowel sounds are present. No CVA tenderness.  Genitourinary: Penis normal. Uncircumcised. No phimosis, paraphimosis, penile erythema or penile tenderness. No discharge found.  Musculoskeletal: He exhibits no edema.  No lower extremity edema or tenderness.  Lymphadenopathy:    He has no cervical adenopathy.  Neurological: He is alert. Coordination normal.  Skin: Skin is warm and dry. No rash noted. He is not diaphoretic. No erythema. No pallor.  Psychiatric: He has a normal mood and affect. His behavior is normal.  Nursing note and vitals reviewed.   ED Course   Procedures (including critical care time) Labs Review Labs Reviewed  URINALYSIS, ROUTINE W REFLEX MICROSCOPIC (NOT AT [facility]) - Abnormal; Notable for the following:    APPearance TURBID (*)    Hgb urine dipstick LARGE (*)    Protein, ur 100 (*)    Leukocytes, UA LARGE (*)    All other components within normal limits  BASIC METABOLIC PANEL - Abnormal; Notable  for the following:    Glucose, Bld 176 (*)    BUN 27 (*)    GFR calc non Af Amer 51 (*)    GFR calc Af Amer 59 (*)    All other components within normal limits  CBC WITH DIFFERENTIAL/PLATELET - Abnormal; Notable for the following:    WBC 12.9 (*)    Platelets 84 (*)    Neutrophils Relative % 90 (*)    Neutro Abs 11.6 (*)    Lymphocytes Relative 3 (*)    Lymphs Abs 0.4 (*)    All other components within normal limits  URINE MICROSCOPIC-ADD ON - Abnormal; Notable for the following:    Bacteria, UA FEW (*)    All other components within normal limits  URINE CULTURE  CULTURE, BLOOD (ROUTINE X 2)  CULTURE, BLOOD (ROUTINE X 2)  I-STAT CG4 LACTIC ACID, ED    Imaging Review No results found.   EKG Interpretation None     Filed Vitals:   10/03/14 1440 10/03/14 1445 10/03/14 1530 10/03/14 1545  BP: 156/80 145/74 129/62 122/59  Pulse: 76 74 82 86  Temp:      TempSrc:      Resp: 18     SpO2: 96% 96% 93% 92%    MDM   Meds given in ED:  Medications  sodium chloride 0.9 % injection 3 mL (not administered)  cefTRIAXone (ROCEPHIN) 1 g in dextrose 5 % 50 mL IVPB (not administered)  acetaminophen (TYLENOL) tablet 1,000 mg (1,000 mg Oral Given 10/03/14 1410)  cefTRIAXone (ROCEPHIN) 1 g in dextrose 5 % 50 mL IVPB (0 g Intravenous Stopped 10/03/14 1618)    New Prescriptions   No medications on file    Final diagnoses:  Fever  UTI (lower urinary tract infection)   This is a 81 y.o. male with a history of coronary artery disease, prostate cancer, CABG, dementia, chronic UTIs, and hypertension who presents to the emergency  department complaining of chills, subjective fever, dysuria and generalized body aches ongoing for the past 3-4 days. Patient reports he has frequent UTIs and his last UTI was 09/09/2014 for which he is admitted. He reports even feeling better after discharge until about 3-4 days ago when he started having chills and body aches with associated dysuria. He also reports feeling fatigued. On exam the patient has a rectal temperature of 101. He is nontoxic pain. His abdomen is soft and nontender to palpation. Patient given Tylenol in the ED. Patient's lactic acid is normal at 1.34. His urinalysis indicates large leukocytes with too numerous to count white blood cells. Urine culture is pending. BMP is remarkable for mildly elevated creatinine 1.24 with a GFR of 51. CBCs indicates a white count of 12.9. Patient given a gram of Rocephin in the ED and consulted for admission. Patient and family are in agreement with admission. Dr. Cruzita Lederer accepted the patient for admission and requested telemetry bed.  Temp admission orders placed for telemetry.  This patient was discussed with and evaluated by Dr. Maryan Rued who agrees with assessment and plan.   Waynetta Pean, PA-C 10/03/14 [id number]  Blanchie Dessert, MD 10/06/14 [phone]

## 2014-10-04 ENCOUNTER — Encounter (HOSPITAL_COMMUNITY): Payer: Self-pay | Admitting: *Deleted

## 2014-10-04 DIAGNOSIS — Z9842 Cataract extraction status, left eye: Secondary | ICD-10-CM | POA: Diagnosis not present

## 2014-10-04 DIAGNOSIS — I10 Essential (primary) hypertension: Secondary | ICD-10-CM | POA: Diagnosis present

## 2014-10-04 DIAGNOSIS — E785 Hyperlipidemia, unspecified: Secondary | ICD-10-CM | POA: Diagnosis present

## 2014-10-04 DIAGNOSIS — B9689 Other specified bacterial agents as the cause of diseases classified elsewhere: Secondary | ICD-10-CM | POA: Diagnosis present

## 2014-10-04 DIAGNOSIS — Z882 Allergy status to sulfonamides status: Secondary | ICD-10-CM | POA: Diagnosis not present

## 2014-10-04 DIAGNOSIS — R509 Fever, unspecified: Secondary | ICD-10-CM | POA: Diagnosis not present

## 2014-10-04 DIAGNOSIS — I255 Ischemic cardiomyopathy: Secondary | ICD-10-CM | POA: Diagnosis present

## 2014-10-04 DIAGNOSIS — Z961 Presence of intraocular lens: Secondary | ICD-10-CM | POA: Diagnosis present

## 2014-10-04 DIAGNOSIS — N179 Acute kidney failure, unspecified: Secondary | ICD-10-CM | POA: Diagnosis present

## 2014-10-04 DIAGNOSIS — I5022 Chronic systolic (congestive) heart failure: Secondary | ICD-10-CM | POA: Diagnosis not present

## 2014-10-04 DIAGNOSIS — C61 Malignant neoplasm of prostate: Secondary | ICD-10-CM | POA: Diagnosis not present

## 2014-10-04 DIAGNOSIS — Z8546 Personal history of malignant neoplasm of prostate: Secondary | ICD-10-CM | POA: Diagnosis not present

## 2014-10-04 DIAGNOSIS — G510 Bell's palsy: Secondary | ICD-10-CM | POA: Diagnosis present

## 2014-10-04 DIAGNOSIS — Z79891 Long term (current) use of opiate analgesic: Secondary | ICD-10-CM | POA: Diagnosis not present

## 2014-10-04 DIAGNOSIS — Z888 Allergy status to other drugs, medicaments and biological substances status: Secondary | ICD-10-CM | POA: Diagnosis not present

## 2014-10-04 DIAGNOSIS — I2510 Atherosclerotic heart disease of native coronary artery without angina pectoris: Secondary | ICD-10-CM | POA: Diagnosis present

## 2014-10-04 DIAGNOSIS — I252 Old myocardial infarction: Secondary | ICD-10-CM | POA: Diagnosis not present

## 2014-10-04 DIAGNOSIS — Z7982 Long term (current) use of aspirin: Secondary | ICD-10-CM | POA: Diagnosis not present

## 2014-10-04 DIAGNOSIS — K219 Gastro-esophageal reflux disease without esophagitis: Secondary | ICD-10-CM | POA: Diagnosis present

## 2014-10-04 DIAGNOSIS — M1990 Unspecified osteoarthritis, unspecified site: Secondary | ICD-10-CM | POA: Diagnosis present

## 2014-10-04 DIAGNOSIS — D696 Thrombocytopenia, unspecified: Secondary | ICD-10-CM | POA: Diagnosis present

## 2014-10-04 DIAGNOSIS — F0390 Unspecified dementia without behavioral disturbance: Secondary | ICD-10-CM | POA: Diagnosis present

## 2014-10-04 DIAGNOSIS — N390 Urinary tract infection, site not specified: Secondary | ICD-10-CM | POA: Diagnosis present

## 2014-10-04 DIAGNOSIS — Z951 Presence of aortocoronary bypass graft: Secondary | ICD-10-CM | POA: Diagnosis not present

## 2014-10-04 DIAGNOSIS — Z9841 Cataract extraction status, right eye: Secondary | ICD-10-CM | POA: Diagnosis not present

## 2014-10-04 DIAGNOSIS — Z881 Allergy status to other antibiotic agents status: Secondary | ICD-10-CM | POA: Diagnosis not present

## 2014-10-04 DIAGNOSIS — E780 Pure hypercholesterolemia: Secondary | ICD-10-CM | POA: Diagnosis present

## 2014-10-04 DIAGNOSIS — Z87891 Personal history of nicotine dependence: Secondary | ICD-10-CM | POA: Diagnosis not present

## 2014-10-04 DIAGNOSIS — Z85820 Personal history of malignant melanoma of skin: Secondary | ICD-10-CM | POA: Diagnosis not present

## 2014-10-04 LAB — BASIC METABOLIC PANEL
Anion gap: 8 (ref 5–15)
BUN: 27 mg/dL — ABNORMAL HIGH (ref 6–20)
CO2: 24 mmol/L (ref 22–32)
Calcium: 8.9 mg/dL (ref 8.9–10.3)
Chloride: 105 mmol/L (ref 101–111)
Creatinine, Ser: 1.33 mg/dL — ABNORMAL HIGH (ref 0.61–1.24)
GFR calc Af Amer: 55 mL/min — ABNORMAL LOW (ref >60)
GFR calc non Af Amer: 47 mL/min — ABNORMAL LOW (ref >60)
Glucose, Bld: 152 mg/dL — ABNORMAL HIGH (ref 65–99)
Potassium: 3.7 mmol/L (ref 3.5–5.1)
Sodium: 137 mmol/L (ref 135–145)

## 2014-10-04 LAB — CBC
HCT: 41.2 % (ref 39.0–52.0)
Hemoglobin: 13.7 g/dL (ref 13.0–17.0)
MCH: 30.3 pg (ref 26.0–34.0)
MCHC: 33.3 g/dL (ref 30.0–36.0)
MCV: 91.2 fL (ref 78.0–100.0)
Platelets: 74 10*3/uL — ABNORMAL LOW (ref 150–400)
RBC: 4.52 MIL/uL (ref 4.22–5.81)
RDW: 14 % (ref 11.5–15.5)
WBC: 16.4 10*3/uL — ABNORMAL HIGH (ref 4.0–10.5)

## 2014-10-04 MED ORDER — PIPERACILLIN-TAZOBACTAM 3.375 G IVPB
3.3750 g | Freq: Three times a day (TID) | INTRAVENOUS | Status: DC
  Administered 2014-10-04 – 2014-10-05 (×3): 3.375 g via INTRAVENOUS
  Filled 2014-10-04 (×5): qty 50

## 2014-10-04 MED ORDER — VANCOMYCIN HCL 10 G IV SOLR
1500.0000 mg | Freq: Once | INTRAVENOUS | Status: DC
  Administered 2014-10-04: 1500 mg via INTRAVENOUS
  Filled 2014-10-04: qty 1500

## 2014-10-04 MED ORDER — VANCOMYCIN HCL IN DEXTROSE 1-5 GM/200ML-% IV SOLN: 1000.0000 mg | INTRAVENOUS | Status: DC

## 2014-10-04 MED ORDER — SODIUM CHLORIDE 0.9 % IV SOLN
INTRAVENOUS | Status: AC
  Administered 2014-10-04: 12:00:00 via INTRAVENOUS

## 2014-10-04 NOTE — Progress Notes (Signed)
ANTIBIOTIC CONSULT NOTE - INITIAL  Pharmacy Consult for vancomycin and zosyn Indication: pneumonia  Allergies  Allergen Reactions  . Pantoprazole Other (See Comments)    Headache and lightheaded  . Ciprofloxacin Swelling and Hives    Allergy classified as swelling here, but pt says Cipro just doesn't work for him    . Nitrofurantoin Hives  . Sulfa Antibiotics Hives and Itching    Patient Measurements: Height: 5\' 5"  (165.1 cm) Weight: 163 lb 11.2 oz (74.254 kg) IBW/kg (Calculated) : 61.5   Vital Signs: Temp: 97.9 F (36.6 C) (07/09 0502) Temp Source: Oral (07/09 0502) BP: 110/54 mmHg (07/09 0532) Pulse Rate: 64 (07/09 0502) Intake/Output from previous day: 07/08 0701 - 07/09 0700 In: 540 [P.O.:540] Out: 450 [Urine:450] Intake/Output from this shift: Total I/O In: 200 [P.O.:200] Out: 300 [Urine:300]  Labs:  Recent Labs  10/03/14 1305 10/04/14 0449  WBC 12.9* 16.4*  HGB 14.4 13.7  PLT 84* 74*  CREATININE 1.24 1.33*   Estimated Creatinine Clearance: 38.3 mL/min (by C-G formula based on Cr of 1.33).   Microbiology: Recent Results (from the past 720 hour(s))  Urine culture     Status: None   Collection Time: 09/09/14  8:11 PM  Result Value Ref Range Status   Specimen Description URINE, RANDOM  Final   Special Requests ADDED 09/10/14 0553  Final   Culture   Final    ESCHERICHIA COLI Performed at [facility]    Report Status 09/12/2014 FINAL  Final   Organism ID, Bacteria ESCHERICHIA COLI  Final      Susceptibility   Escherichia coli - MIC*    AMPICILLIN 4 SENSITIVE Sensitive     CEFAZOLIN <=4 SENSITIVE Sensitive     CEFTRIAXONE <=1 SENSITIVE Sensitive     CIPROFLOXACIN <=0.25 SENSITIVE Sensitive     GENTAMICIN <=1 SENSITIVE Sensitive     LEVOFLOXACIN <=0.12 SENSITIVE Sensitive     NITROFURANTOIN <=16 SENSITIVE Sensitive     TOBRAMYCIN <=1 SENSITIVE Sensitive     TRIMETH/SULFA <=20 SENSITIVE Sensitive     PIP/TAZO <=4 SENSITIVE Sensitive    * ESCHERICHIA COLI  Culture, blood (routine x 2)     Status: None   Collection Time: 09/10/14 12:15 AM  Result Value Ref Range Status   Specimen Description BLOOD RIGHT ARM  Final   Special Requests BOTTLES DRAWN AEROBIC AND ANAEROBIC 10CC EACH  Final   Culture   Final    ESCHERICHIA COLI Note: Gram Stain Report Called to,Read Back By and Verified With: JASMINE A BY INGRAM A 09/10/14 305P Performed at [facility]    Report Status 09/12/2014 FINAL  Final   Organism ID, Bacteria ESCHERICHIA COLI  Final      Susceptibility   Escherichia coli - MIC*    AMPICILLIN 4 SENSITIVE Sensitive     AMPICILLIN/SULBACTAM <=2 SENSITIVE Sensitive     CEFAZOLIN <=4 SENSITIVE Sensitive     CEFEPIME <=1 SENSITIVE Sensitive     CEFTAZIDIME <=1 SENSITIVE Sensitive     CEFTRIAXONE <=1 SENSITIVE Sensitive     CIPROFLOXACIN <=0.25 SENSITIVE Sensitive     GENTAMICIN <=1 SENSITIVE Sensitive     IMIPENEM <=0.25 SENSITIVE Sensitive     PIP/TAZO <=4 SENSITIVE Sensitive     TOBRAMYCIN <=1 SENSITIVE Sensitive     TRIMETH/SULFA <=20 SENSITIVE Sensitive     * ESCHERICHIA COLI  Culture, blood (routine x 2)     Status: None   Collection Time: 09/10/14 12:22 AM  Result Value Ref Range  Status   Specimen Description BLOOD RIGHT HAND  Final   Special Requests BOTTLES DRAWN AEROBIC ONLY 10CC  Final   Culture   Final    NO GROWTH 5 DAYS Performed at [facility]    Report Status 09/16/2014 FINAL  Final  Culture, blood (routine x 2)     Status: None (Preliminary result)   Collection Time: 10/03/14  2:55 PM  Result Value Ref Range Status   Specimen Description BLOOD RIGHT ARM  Final   Special Requests BOTTLES DRAWN AEROBIC AND ANAEROBIC 10CC  Final   Culture  Setup Time   Final    GRAM VARIABLE ROD ANAEROBIC BOTTLE ONLY CRITICAL RESULT CALLED TO, READ BACK BY AND VERIFIED WITH: K VANNACHITH,RN 10/04/14 AT 0423 RHOLMES CONFIRMED VERBALLY BY J BEAMAN GRAM NEGATIVE RODS    Culture PENDING  Incomplete    Report Status PENDING  Incomplete  Culture, blood (routine x 2)     Status: None (Preliminary result)   Collection Time: 10/03/14  3:08 PM  Result Value Ref Range Status   Specimen Description BLOOD RIGHT HAND  Final   Special Requests BOTTLES DRAWN AEROBIC AND ANAEROBIC 10CC  Final   Culture  Setup Time   Final    GRAM VARIABLE ROD ANAEROBIC BOTTLE ONLY CRITICAL RESULT CALLED TO, READ BACK BY AND VERIFIED WITH: K VANNACHITH,RN 10/04/14 AT 0423 RHOLMES CONFIRMED VERBALLY BY J BEAMAN AEROBIC BOTTLE ONLY    Culture PENDING  Incomplete   Report Status PENDING  Incomplete    Medical History: Past Medical History  Diagnosis Date  . Hyperlipidemia   . S/P CABG (coronary artery bypass graft)     [place]  . History of Bell's palsy     RIGHT SIDE-- NO RESIDUAL  . Gross hematuria   . Urethral stricture   . OAB (overactive bladder)   . Chronic cystitis   . Ischemic cardiomyopathy     ef 35-40% per cath 08-28-2013  . Coronary artery disease CARDIOLOGIST-  DR Grace Bushy  [place] -- S/P  11/89 CABG x6 (70% circ; 90% PD; 60-70% distal left main; 30% left circ; 90% 1st diag;, 2nd diag and 3rd diag. w/90%,  mild stenosis LAD and 60-70% pLAD)  Re-do CABG x5 in 1997  . History of nonmelanoma skin cancer     excision's from scalp  . Wears hearing aid     bilateral  . Wears partial dentures   . At risk for sleep apnea     STOP-BANG= 4    SENT TO PCP 12-23-2013  . GERD (gastroesophageal reflux disease)   . Mild dementia   . Kidney stones "years ago"    "passed them"  . Hypertension     "not anymore" (01/02/2014)  . Myocardial infarction 1986; 1997  . Arthritis     "joints ache" (01/02/2014)  . Prostate cancer 1998    S/P  RADIACTIVE SEED IMPLANTS; UROLOGIST-  DR GRAPEY  . Melanoma of ear     "right"    Medications:  Scheduled:  . aspirin EC  81 mg Oral Daily  . atorvastatin  40 mg Oral QPM  . cholecalciferol  2,000 Units Oral Daily  . enoxaparin (LOVENOX) injection   40 mg Subcutaneous Q24H  . galantamine  8 mg Oral Q breakfast  . isosorbide mononitrate  30 mg Oral q morning - 10a  . multivitamin with minerals  1 tablet Oral Daily  . polyvinyl alcohol  1 drop Both Eyes BID  . sodium chloride  3 mL Intravenous Q12H   Assessment: 81 YO male admitted for UTI. PMH significant for HLD, HTN, CAD, hx ischemic cardiomyopathy, chronic systolic HF, recurrent UTIs, hx prostate cancer. Pt was last discharged from hospital on 6/17 after being admitted for UTI and E.coli + bacteremia. Discharged on cefuroxime 6/17-6/27.  HCAP/UTI/bacteremia. Urinalysis + for UTI. CXR suspicious for RUL PNA. Febrile on admission, currently afeb. WBC elevated 12.9 >16.4.   7/8 ceftriaxone 1g x1 in ED 7/9 vanc>> 7/9 zosyn >>  7/8 BCx > 2/2 gram variable rod, 1/2 GNR 7/8 urine cx > sent   Goal of Therapy:  Vancomycin trough level 15-20 mcg/ml  Plan:  - Load vanc 1500 mg x1. Start vanc 1000mg  q24h tomorrow - Start zosyn 3.375 g q8h - f/u clinical progression, cultures, renal function  Joya San, PharmD Clinical Pharmacist Pager # [phone] 10/04/2014 9:54 AM

## 2014-10-04 NOTE — Progress Notes (Addendum)
[facility] PROGRESS NOTE  Ronald [name] [id number] DOB: 1927-08-01 DOA: 10/03/2014 PCP: Nyoka Cowden, MD  Assessment/Plan: Principal Problem:   UTI (urinary tract infection) Active Problems:   HYPERCHOLESTEROLEMIA   HTN (hypertension)   Chronic systolic CHF (congestive heart failure)   Fever    UTI  Blood culture showed gram variable Rod, gram-negative rod, initiated on Rocephin upon admission Now transition to Zosyn and vancomycin   pending further speciation and sensitivity We'll obtain pelvic ultrasound to evaluate the prostrate given recurrent UTIs, rule out prostatic abscess patient does have an outpatient urologist Dr. Brown Human - Patient is nonseptic appearing, his lactic acid normal  Chronic systolic heart failure - Most recent 2-D echo was done in October 2015, showed an ejection fraction of 45-50% - Patient currently has no evidence of fluid overload, minimize IV fluids   CAD - no chest pain, monitor in telemetry  - resume home medications  Thrombocytopenia  - This is chronic, dating back as far as 2008, his platelets are close to baseline  - SCD for DVT prophylaxis  Acute kidney injury, likely prerenal, creatinine has gone up from 0.99-1.33  Code Status:      Code Status Orders        Start     Ordered   10/03/14 1615  Full code   Continuous     10/03/14 1614     Family Communication: family updated about patient's clinical progress Disposition Plan:  As above    Brief narrative: Ronald [name] is a 81 y.o. male has a past medical history significant for hyperlipidemia, hypertension, coronary artery disease, history of ischemic cardiomyopathy and chronic systolic heart failure with an ejection fraction of 45-50%, recurrent urinary tract infections, history of prostate cancer, presents to the emergency room with a chief complaint of fever, weakness, dysuria, progressively worsened for the past 3 days. Patient has a history of  recurrent urinary tract infections, and he was hospitalized just a month ago where he had Escherichia coli bacteremia. Since his discharge, he has been doing well, up until 3 days ago. He denies any chest pains, denies any shortness of breath, he denies any abdominal pain, nausea, vomiting or diarrhea. He has remained pretty active and is still driving. He denies any lightheadedness or dizziness. He just feels "weak". He hasn't been able to do his normal things around the house. He is also complaining of generalized body aches. In the emergency room, patient was found to be febrile to 101, he is not tachycardic nor hypotensive, he was found to have a leukocytosis of 12.9, his lactic acid is normal and the urinalysis is clearly positive for UTI. [facility] was asked for admission for urinary tract infection.  Consultants:  None  Procedures:  None  Antibiotics: Anti-infectives    Start     Dose/Rate Route Frequency Ordered Stop   10/05/14 1100  vancomycin (VANCOCIN) IVPB 1000 mg/200 mL premix     1,000 mg 200 mL/hr over 60 Minutes Intravenous Every 24 hours 10/04/14 1017     10/04/14 1100  vancomycin (VANCOCIN) 1,500 mg in sodium chloride 0.9 % 500 mL IVPB     1,500 mg 250 mL/hr over 120 Minutes Intravenous  Once 10/04/14 1017     10/04/14 1100  piperacillin-tazobactam (ZOSYN) IVPB 3.375 g     3.375 g 12.5 mL/hr over 240 Minutes Intravenous 3 times per day 10/04/14 1017     10/04/14 0800  cefTRIAXone (ROCEPHIN) 1 g in dextrose  5 % 50 mL IVPB  Status:  Discontinued     1 g 100 mL/hr over 30 Minutes Intravenous Every 24 hours 10/03/14 1615 10/04/14 0908   10/03/14 1545  cefTRIAXone (ROCEPHIN) 1 g in dextrose 5 % 50 mL IVPB     1 g 100 mL/hr over 30 Minutes Intravenous  Once 10/03/14 1531 10/03/14 1618         HPI/Subjective: Subjectively feels well, afebrile overnight denies any nausea vomiting abdominal pain,  Objective: Filed Vitals:   10/03/14 1655 10/03/14 2113 10/04/14 0502 10/04/14  0532  BP: 96/45 135/81 120/45 110/54  Pulse: 72 104 64   Temp: 97.9 F (36.6 C) 98.4 F (36.9 C) 97.9 F (36.6 C)   TempSrc: Oral Oral Oral   Resp: 22 20 18    Height: 5\' 5"  (1.651 m)     Weight: 74.254 kg (163 lb 11.2 oz)     SpO2: 94% 100% 96%     Intake/Output Summary (Last 24 hours) at 10/04/14 1105 Last data filed at 10/04/14 [id number]  Gross per 24 hour  Intake    740 ml  Output    750 ml  Net    -10 ml    Exam:  General: No acute respiratory distress Lungs: Clear to auscultation bilaterally without wheezes or crackles Cardiovascular: Regular rate and rhythm without murmur gallop or rub normal S1 and S2 Abdomen: Nontender, nondistended, soft, bowel sounds positive, no rebound, no ascites, no appreciable mass Extremities: No significant cyanosis, clubbing, or edema bilateral lower extremities     Data Review   Micro Results Recent Results (from the past 240 hour(s))  Culture, blood (routine x 2)     Status: None (Preliminary result)   Collection Time: 10/03/14  2:55 PM  Result Value Ref Range Status   Specimen Description BLOOD RIGHT ARM  Final   Special Requests BOTTLES DRAWN AEROBIC AND ANAEROBIC 10CC  Final   Culture  Setup Time   Final    GRAM VARIABLE ROD ANAEROBIC BOTTLE ONLY CRITICAL RESULT CALLED TO, READ BACK BY AND VERIFIED WITH: K VANNACHITH,RN 10/04/14 AT 0423 RHOLMES CONFIRMED VERBALLY BY J BEAMAN GRAM NEGATIVE RODS    Culture PENDING  Incomplete   Report Status PENDING  Incomplete  Culture, blood (routine x 2)     Status: None (Preliminary result)   Collection Time: 10/03/14  3:08 PM  Result Value Ref Range Status   Specimen Description BLOOD RIGHT HAND  Final   Special Requests BOTTLES DRAWN AEROBIC AND ANAEROBIC 10CC  Final   Culture  Setup Time   Final    GRAM VARIABLE ROD ANAEROBIC BOTTLE ONLY CRITICAL RESULT CALLED TO, READ BACK BY AND VERIFIED WITH: K VANNACHITH,RN 10/04/14 AT 0423 RHOLMES CONFIRMED VERBALLY BY J BEAMAN AEROBIC BOTTLE ONLY     Culture PENDING  Incomplete   Report Status PENDING  Incomplete    Radiology Reports Dg Chest Port 1 View  10/03/2014   CLINICAL DATA:  81 year old male with shortness of breath and fever. Initial encounter.  EXAM: PORTABLE CHEST - 1 VIEW  COMPARISON:  08/27/2013 and earlier.  FINDINGS: Portable AP semi upright view at 1659 hours. Stable lung volumes. Stable cardiac size and mediastinal contours. Sequelae of CABG. Chronic scarring or atelectasis at the left lung base is stable. No pneumothorax or pulmonary edema. Subtle reticulonodular increased density in the right upper lobe. The right lung base is stable.  IMPRESSION: 1. Subtle increased reticulonodular density in the right upper lobe suspicious for developing pneumonia in  this setting. No pleural effusion. 2. Stable left lung base scar or atelectasis.   Electronically Signed   By: Genevie Ann M.D.   On: 10/03/2014 17:10     CBC  Recent Labs Lab 10/03/14 1305 10/04/14 0449  WBC 12.9* 16.4*  HGB 14.4 13.7  HCT 44.2 41.2  PLT 84* 74*  MCV 93.1 91.2  MCH 30.3 30.3  MCHC 32.6 33.3  RDW 13.7 14.0  LYMPHSABS 0.4*  --   MONOABS 0.7  --   EOSABS 0.1  --   BASOSABS 0.0  --     Chemistries   Recent Labs Lab 10/03/14 1305 10/04/14 0449  NA 139 137  K 4.0 3.7  CL 106 105  CO2 25 24  GLUCOSE 176* 152*  BUN 27* 27*  CREATININE 1.24 1.33*  CALCIUM 9.6 8.9   ------------------------------------------------------------------------------------------------------------------ estimated creatinine clearance is 38.3 mL/min (by C-G formula based on Cr of 1.33). ------------------------------------------------------------------------------------------------------------------ No results for input(s): HGBA1C in the last 72 hours. ------------------------------------------------------------------------------------------------------------------ No results for input(s): CHOL, HDL, LDLCALC, TRIG, CHOLHDL, LDLDIRECT in the last 72  hours. ------------------------------------------------------------------------------------------------------------------ No results for input(s): TSH, T4TOTAL, T3FREE, THYROIDAB in the last 72 hours.  Invalid input(s): FREET3 ------------------------------------------------------------------------------------------------------------------ No results for input(s): VITAMINB12, FOLATE, FERRITIN, TIBC, IRON, RETICCTPCT in the last 72 hours.  Coagulation profile No results for input(s): INR, PROTIME in the last 168 hours.  No results for input(s): DDIMER in the last 72 hours.  Cardiac Enzymes No results for input(s): CKMB, TROPONINI, MYOGLOBIN in the last 168 hours.  Invalid input(s): CK ------------------------------------------------------------------------------------------------------------------ Invalid input(s): POCBNP   CBG: No results for input(s): GLUCAP in the last 168 hours.     Studies: Dg Chest Port 1 View  10/03/2014   CLINICAL DATA:  81 year old male with shortness of breath and fever. Initial encounter.  EXAM: PORTABLE CHEST - 1 VIEW  COMPARISON:  08/27/2013 and earlier.  FINDINGS: Portable AP semi upright view at 1659 hours. Stable lung volumes. Stable cardiac size and mediastinal contours. Sequelae of CABG. Chronic scarring or atelectasis at the left lung base is stable. No pneumothorax or pulmonary edema. Subtle reticulonodular increased density in the right upper lobe. The right lung base is stable.  IMPRESSION: 1. Subtle increased reticulonodular density in the right upper lobe suspicious for developing pneumonia in this setting. No pleural effusion. 2. Stable left lung base scar or atelectasis.   Electronically Signed   By: Genevie Ann M.D.   On: 10/03/2014 17:10      Lab Results  Component Value Date   HGBA1C 6.0* 08/27/2013   Lab Results  Component Value Date   LDLCALC 65 08/28/2013   CREATININE 1.33* 10/04/2014       Scheduled Meds: . aspirin EC  81 mg  Oral Daily  . atorvastatin  40 mg Oral QPM  . cholecalciferol  2,000 Units Oral Daily  . enoxaparin (LOVENOX) injection  40 mg Subcutaneous Q24H  . galantamine  8 mg Oral Q breakfast  . isosorbide mononitrate  30 mg Oral q morning - 10a  . multivitamin with minerals  1 tablet Oral Daily  . piperacillin-tazobactam (ZOSYN)  IV  3.375 g Intravenous 3 times per day  . polyvinyl alcohol  1 drop Both Eyes BID  . sodium chloride  3 mL Intravenous Q12H  . vancomycin  1,500 mg Intravenous Once  . [START ON 10/05/2014] vancomycin  1,000 mg Intravenous Q24H   Continuous Infusions:   Principal Problem:   UTI (urinary tract infection) Active Problems:  HYPERCHOLESTEROLEMIA   HTN (hypertension)   Chronic systolic CHF (congestive heart failure)   Fever    Time spent: 45 minutes   [facility] Pager [phone]. If 7PM-7AM, please contact night-coverage at [email or web address], password [facility] 10/04/2014, 11:05 AM

## 2014-10-04 NOTE — Progress Notes (Signed)
Effie Berkshire from lab called about positive blood cultures in patient's anaerobic bottle for gram variable rods. On-call clinician [place] notified, and aware.

## 2014-10-04 NOTE — Progress Notes (Addendum)
CRITICAL VALUE ALERT  Critical value received: Gram negative rods in aerobic bottle of blood cultures  Date of notification: 10/04/14  Time of notification:0614  Critical value read back: Yes  Nurse who received alert: Justine Null, RN  MD notified (1st page): Yes  Time of first page: 0615  Responding MD: M. Donnal Debar  Time MD responded: [phone]   Clinician aware, no new orders.

## 2014-10-05 ENCOUNTER — Inpatient Hospital Stay (HOSPITAL_COMMUNITY): Payer: Medicare Other

## 2014-10-05 ENCOUNTER — Encounter (HOSPITAL_COMMUNITY): Payer: Self-pay | Admitting: Radiology

## 2014-10-05 LAB — COMPREHENSIVE METABOLIC PANEL
ALT: 24 U/L (ref 17–63)
AST: 23 U/L (ref 15–41)
Albumin: 2.7 g/dL — ABNORMAL LOW (ref 3.5–5.0)
Alkaline Phosphatase: 59 U/L (ref 38–126)
Anion gap: 9 (ref 5–15)
BUN: 21 mg/dL — ABNORMAL HIGH (ref 6–20)
CO2: 23 mmol/L (ref 22–32)
Calcium: 8.4 mg/dL — ABNORMAL LOW (ref 8.9–10.3)
Chloride: 105 mmol/L (ref 101–111)
Creatinine, Ser: 1.3 mg/dL — ABNORMAL HIGH (ref 0.61–1.24)
GFR calc Af Amer: 56 mL/min — ABNORMAL LOW (ref >60)
GFR calc non Af Amer: 48 mL/min — ABNORMAL LOW (ref >60)
Glucose, Bld: 115 mg/dL — ABNORMAL HIGH (ref 65–99)
Potassium: 3.4 mmol/L — ABNORMAL LOW (ref 3.5–5.1)
Sodium: 137 mmol/L (ref 135–145)
Total Bilirubin: 1.1 mg/dL (ref 0.3–1.2)
Total Protein: 5.4 g/dL — ABNORMAL LOW (ref 6.5–8.1)

## 2014-10-05 LAB — CBC
HCT: 40.2 % (ref 39.0–52.0)
Hemoglobin: 13.3 g/dL (ref 13.0–17.0)
MCH: 30.6 pg (ref 26.0–34.0)
MCHC: 33.1 g/dL (ref 30.0–36.0)
MCV: 92.4 fL (ref 78.0–100.0)
Platelets: 61 10*3/uL — ABNORMAL LOW (ref 150–400)
RBC: 4.35 MIL/uL (ref 4.22–5.81)
RDW: 14 % (ref 11.5–15.5)
WBC: 8.7 10*3/uL (ref 4.0–10.5)

## 2014-10-05 MED ORDER — AMOXICILLIN-POT CLAVULANATE 875-125 MG PO TABS
1.0000 | ORAL_TABLET | Freq: Two times a day (BID) | ORAL | Status: DC
  Administered 2014-10-05 – 2014-10-06 (×3): 1 via ORAL
  Filled 2014-10-05 (×4): qty 1

## 2014-10-05 MED ORDER — IOHEXOL 300 MG/ML  SOLN
100.0000 mL | Freq: Once | INTRAMUSCULAR | Status: AC | PRN
  Administered 2014-10-05: 100 mL via INTRAVENOUS

## 2014-10-05 MED ORDER — SODIUM CHLORIDE 0.9 % IV SOLN
INTRAVENOUS | Status: AC
  Administered 2014-10-05: 10:00:00 via INTRAVENOUS

## 2014-10-05 NOTE — Progress Notes (Addendum)
[facility] PROGRESS NOTE  [name] [id number] DOB: 1927/07/03 DOA: 10/03/2014 PCP: Nyoka Cowden, MD  Assessment/Plan: Principal Problem:   UTI (urinary tract infection) Active Problems:   HYPERCHOLESTEROLEMIA   HTN (hypertension)   Chronic systolic CHF (congestive heart failure)   Fever    UTI with gram-negative bacteremia Blood culture showed gram variable Rod, gram-negative rod, initiated on Rocephin upon admission, switch to Zosyn Based on speciation and sensitivity, patient has been switched to Augmentin, improving white count from 16.4-8.7, patient nontoxic and afebrile Given recurrent UTIs, CT scan of the pelvis ordered to rule out prostatic abscess  patient does have an outpatient urologist Dr. Brown Ronald [name], but would like to establish with a new urologist Continue IV fluids given patient will receive IV contrast  Chronic systolic heart failure - Most recent 2-D echo was done in October 2015, showed an ejection fraction of 45-50% - Patient currently has no evidence of fluid overload, minimize IV fluids   CAD - no chest pain, monitor in telemetry  - resume home medications  Thrombocytopenia  - This is chronic, dating back as far as 2008, his platelets are close to baseline  - SCD for DVT prophylaxis  Acute kidney injury, likely prerenal, creatinine has gone up from 0.99-1.33  Code Status:      Code Status Orders        Start     Ordered   10/03/14 1615  Full code   Continuous     10/03/14 1614     Family Communication: family updated about patient's clinical progress Disposition Plan:  Anticipate discharge tomorrow if prostatic pain resolves    Brief narrative: Ronald [name] is a 81 y.o. male has a past medical history significant for hyperlipidemia, hypertension, coronary artery disease, history of ischemic cardiomyopathy and chronic systolic heart failure with an ejection fraction of 45-50%, recurrent urinary tract infections,  history of prostate cancer, presents to the emergency room with a chief complaint of fever, weakness, dysuria, progressively worsened for the past 3 days. Patient has a history of recurrent urinary tract infections, and he was hospitalized just a month ago where he had Escherichia coli bacteremia. Since his discharge, he has been doing well, up until 3 days ago. He denies any chest pains, denies any shortness of breath, he denies any abdominal pain, nausea, vomiting or diarrhea. He has remained pretty active and is still driving. He denies any lightheadedness or dizziness. He just feels "weak". He hasn't been able to do his normal things around the house. He is also complaining of generalized body aches. In the emergency room, patient was found to be febrile to 101, he is not tachycardic nor hypotensive, he was found to have a leukocytosis of 12.9, his lactic acid is normal and the urinalysis is clearly positive for UTI. TRH was asked for admission for urinary tract infection.  Consultants:  None  Procedures:  None  Antibiotics: Anti-infectives    Start     Dose/Rate Route Frequency Ordered Stop   10/05/14 1100  vancomycin (VANCOCIN) IVPB 1000 mg/200 mL premix  Status:  Discontinued     1,000 mg 200 mL/hr over 60 Minutes Intravenous Every 24 hours 10/04/14 1017 10/04/14 1335   10/05/14 1000  amoxicillin-clavulanate (AUGMENTIN) 875-125 MG per tablet 1 tablet     1 tablet Oral Every 12 hours 10/05/14 0833     10/04/14 1100  vancomycin (VANCOCIN) 1,500 mg in sodium chloride 0.9 % 500 mL IVPB  Status:  Discontinued     1,500 mg 250 mL/hr over 120 Minutes Intravenous  Once 10/04/14 1017 10/04/14 1335   10/04/14 1100  piperacillin-tazobactam (ZOSYN) IVPB 3.375 g  Status:  Discontinued     3.375 g 12.5 mL/hr over 240 Minutes Intravenous 3 times per day 10/04/14 1017 10/05/14 0833   10/04/14 0800  cefTRIAXone (ROCEPHIN) 1 g in dextrose 5 % 50 mL IVPB  Status:  Discontinued     1 g 100 mL/hr over 30  Minutes Intravenous Every 24 hours 10/03/14 1615 10/04/14 0908   10/03/14 1545  cefTRIAXone (ROCEPHIN) 1 g in dextrose 5 % 50 mL IVPB     1 g 100 mL/hr over 30 Minutes Intravenous  Once 10/03/14 1531 10/03/14 1618         HPI/Subjective: Complained of having prostatic pain last night,  Objective: Filed Vitals:   10/04/14 0532 10/04/14 1327 10/04/14 2131 10/05/14 0518  BP: 110/54 106/54 113/52 104/54  Pulse:  65 63 50  Temp:  97.8 F (36.6 C) 98.2 F (36.8 C) 98.3 F (36.8 C)  TempSrc:  Oral Oral Oral  Resp:  18 18 20   Height:      Weight:      SpO2:  95% 95% 96%    Intake/Output Summary (Last 24 hours) at 10/05/14 0915 Last data filed at 10/05/14 0500  Gross per 24 hour  Intake    650 ml  Output   1300 ml  Net   -650 ml    Exam:  General: No acute respiratory distress Lungs: Clear to auscultation bilaterally without wheezes or crackles Cardiovascular: Regular rate and rhythm without murmur gallop or rub normal S1 and S2 Abdomen: Nontender, nondistended, soft, bowel sounds positive, no rebound, no ascites, no appreciable mass Extremities: No significant cyanosis, clubbing, or edema bilateral lower extremities     Data Review   Micro Results Recent Results (from the past 240 hour(s))  Urine culture     Status: None (Preliminary result)   Collection Time: 10/03/14  1:51 PM  Result Value Ref Range Status   Specimen Description URINE, RANDOM  Final   Special Requests NONE  Final   Culture   Final    >=100,000 COLONIES/mL ESCHERICHIA COLI 30,000 COLONIES/mL PSEUDOMONAS AERUGINOSA    Report Status PENDING  Incomplete   Organism ID, Bacteria ESCHERICHIA COLI  Final      Susceptibility   Escherichia coli - MIC*    AMPICILLIN 4 SENSITIVE Sensitive     CEFAZOLIN <=4 SENSITIVE Sensitive     CEFTRIAXONE <=1 SENSITIVE Sensitive     CIPROFLOXACIN <=0.25 SENSITIVE Sensitive     GENTAMICIN <=1 SENSITIVE Sensitive     IMIPENEM <=0.25 SENSITIVE Sensitive      NITROFURANTOIN <=16 SENSITIVE Sensitive     TRIMETH/SULFA <=20 SENSITIVE Sensitive     AMPICILLIN/SULBACTAM <=2 SENSITIVE Sensitive     PIP/TAZO <=4 SENSITIVE Sensitive     * >=100,000 COLONIES/mL ESCHERICHIA COLI  Culture, blood (routine x 2)     Status: None (Preliminary result)   Collection Time: 10/03/14  2:55 PM  Result Value Ref Range Status   Specimen Description BLOOD RIGHT ARM  Final   Special Requests BOTTLES DRAWN AEROBIC AND ANAEROBIC 10CC  Final   Culture  Setup Time   Final    GRAM VARIABLE ROD ANAEROBIC BOTTLE ONLY CRITICAL RESULT CALLED TO, READ BACK BY AND VERIFIED WITH: K VANNACHITH,RN 10/04/14 AT 0423 RHOLMES CONFIRMED VERBALLY BY J BEAMAN GRAM NEGATIVE RODS    Culture Lonell Grandchild  NEGATIVE RODS  Final   Report Status PENDING  Incomplete  Culture, blood (routine x 2)     Status: None (Preliminary result)   Collection Time: 10/03/14  3:08 PM  Result Value Ref Range Status   Specimen Description BLOOD RIGHT HAND  Final   Special Requests BOTTLES DRAWN AEROBIC AND ANAEROBIC 10CC  Final   Culture  Setup Time   Final    GRAM VARIABLE ROD ANAEROBIC BOTTLE ONLY CRITICAL RESULT CALLED TO, READ BACK BY AND VERIFIED WITH: K VANNACHITH,RN 10/04/14 AT 0423 RHOLMES CONFIRMED VERBALLY BY J BEAMAN AEROBIC BOTTLE ONLY    Culture GRAM NEGATIVE RODS  Final   Report Status PENDING  Incomplete    Radiology Reports Dg Chest Port 1 View  10/03/2014   CLINICAL DATA:  81 year old male with shortness of breath and fever. Initial encounter.  EXAM: PORTABLE CHEST - 1 VIEW  COMPARISON:  08/27/2013 and earlier.  FINDINGS: Portable AP semi upright view at 1659 hours. Stable lung volumes. Stable cardiac size and mediastinal contours. Sequelae of CABG. Chronic scarring or atelectasis at the left lung base is stable. No pneumothorax or pulmonary edema. Subtle reticulonodular increased density in the right upper lobe. The right lung base is stable.  IMPRESSION: 1. Subtle increased reticulonodular density in  the right upper lobe suspicious for developing pneumonia in this setting. No pleural effusion. 2. Stable left lung base scar or atelectasis.   Electronically Signed   By: Genevie Ann M.D.   On: 10/03/2014 17:10     CBC  Recent Labs Lab 10/03/14 1305 10/04/14 0449 10/05/14 0432  WBC 12.9* 16.4* 8.7  HGB 14.4 13.7 13.3  HCT 44.2 41.2 40.2  PLT 84* 74* 61*  MCV 93.1 91.2 92.4  MCH 30.3 30.3 30.6  MCHC 32.6 33.3 33.1  RDW 13.7 14.0 14.0  LYMPHSABS 0.4*  --   --   MONOABS 0.7  --   --   EOSABS 0.1  --   --   BASOSABS 0.0  --   --     Chemistries   Recent Labs Lab 10/03/14 1305 10/04/14 0449 10/05/14 0432  NA 139 137 137  K 4.0 3.7 3.4*  CL 106 105 105  CO2 25 24 23   GLUCOSE 176* 152* 115*  BUN 27* 27* 21*  CREATININE 1.24 1.33* 1.30*  CALCIUM 9.6 8.9 8.4*  AST  --   --  23  ALT  --   --  24  ALKPHOS  --   --  59  BILITOT  --   --  1.1   ------------------------------------------------------------------------------------------------------------------ estimated creatinine clearance is 39.1 mL/min (by C-G formula based on Cr of 1.3). ------------------------------------------------------------------------------------------------------------------ No results for input(s): HGBA1C in the last 72 hours. ------------------------------------------------------------------------------------------------------------------ No results for input(s): CHOL, HDL, LDLCALC, TRIG, CHOLHDL, LDLDIRECT in the last 72 hours. ------------------------------------------------------------------------------------------------------------------ No results for input(s): TSH, T4TOTAL, T3FREE, THYROIDAB in the last 72 hours.  Invalid input(s): FREET3 ------------------------------------------------------------------------------------------------------------------ No results for input(s): VITAMINB12, FOLATE, FERRITIN, TIBC, IRON, RETICCTPCT in the last 72 hours.  Coagulation profile No results for  input(s): INR, PROTIME in the last 168 hours.  No results for input(s): DDIMER in the last 72 hours.  Cardiac Enzymes No results for input(s): CKMB, TROPONINI, MYOGLOBIN in the last 168 hours.  Invalid input(s): CK ------------------------------------------------------------------------------------------------------------------ Invalid input(s): POCBNP   CBG: No results for input(s): GLUCAP in the last 168 hours.     Studies: Dg Chest Port 1 View  10/03/2014   CLINICAL DATA:  81 year old male with  shortness of breath and fever. Initial encounter.  EXAM: PORTABLE CHEST - 1 VIEW  COMPARISON:  08/27/2013 and earlier.  FINDINGS: Portable AP semi upright view at 1659 hours. Stable lung volumes. Stable cardiac size and mediastinal contours. Sequelae of CABG. Chronic scarring or atelectasis at the left lung base is stable. No pneumothorax or pulmonary edema. Subtle reticulonodular increased density in the right upper lobe. The right lung base is stable.  IMPRESSION: 1. Subtle increased reticulonodular density in the right upper lobe suspicious for developing pneumonia in this setting. No pleural effusion. 2. Stable left lung base scar or atelectasis.   Electronically Signed   By: Genevie Ann M.D.   On: 10/03/2014 17:10      Lab Results  Component Value Date   HGBA1C 6.0* 08/27/2013   Lab Results  Component Value Date   LDLCALC 65 08/28/2013   CREATININE 1.30* 10/05/2014       Scheduled Meds: . amoxicillin-clavulanate  1 tablet Oral Q12H  . aspirin EC  81 mg Oral Daily  . atorvastatin  40 mg Oral QPM  . cholecalciferol  2,000 Units Oral Daily  . enoxaparin (LOVENOX) injection  40 mg Subcutaneous Q24H  . galantamine  8 mg Oral Q breakfast  . isosorbide mononitrate  30 mg Oral q morning - 10a  . multivitamin with minerals  1 tablet Oral Daily  . polyvinyl alcohol  1 drop Both Eyes BID  . sodium chloride  3 mL Intravenous Q12H   Continuous Infusions: . sodium chloride       Principal Problem:   UTI (urinary tract infection) Active Problems:   HYPERCHOLESTEROLEMIA   HTN (hypertension)   Chronic systolic CHF (congestive heart failure)   Fever    Time spent: 45 minutes   [facility] Pager [phone]. If 7PM-7AM, please contact night-coverage at [email or web address], password [facility] 10/05/2014, 9:15 AM  LOS: 1 day

## 2014-10-06 LAB — CBC
HCT: 40.8 % (ref 39.0–52.0)
Hemoglobin: 13.4 g/dL (ref 13.0–17.0)
MCH: 29.8 pg (ref 26.0–34.0)
MCHC: 32.8 g/dL (ref 30.0–36.0)
MCV: 90.9 fL (ref 78.0–100.0)
Platelets: 74 10*3/uL — ABNORMAL LOW (ref 150–400)
RBC: 4.49 MIL/uL (ref 4.22–5.81)
RDW: 13.9 % (ref 11.5–15.5)
WBC: 5.7 10*3/uL (ref 4.0–10.5)

## 2014-10-06 LAB — COMPREHENSIVE METABOLIC PANEL
ALT: 33 U/L (ref 17–63)
AST: 37 U/L (ref 15–41)
Albumin: 2.7 g/dL — ABNORMAL LOW (ref 3.5–5.0)
Alkaline Phosphatase: 76 U/L (ref 38–126)
Anion gap: 8 (ref 5–15)
BUN: 22 mg/dL — ABNORMAL HIGH (ref 6–20)
CO2: 22 mmol/L (ref 22–32)
Calcium: 8.3 mg/dL — ABNORMAL LOW (ref 8.9–10.3)
Chloride: 104 mmol/L (ref 101–111)
Creatinine, Ser: 1.22 mg/dL (ref 0.61–1.24)
GFR calc Af Amer: >60 mL/min (ref >60)
GFR calc non Af Amer: 52 mL/min — ABNORMAL LOW (ref >60)
Glucose, Bld: 113 mg/dL — ABNORMAL HIGH (ref 65–99)
Potassium: 3.9 mmol/L (ref 3.5–5.1)
Sodium: 134 mmol/L — ABNORMAL LOW (ref 135–145)
Total Bilirubin: 1 mg/dL (ref 0.3–1.2)
Total Protein: 5.5 g/dL — ABNORMAL LOW (ref 6.5–8.1)

## 2014-10-06 LAB — URINE CULTURE: Culture: >=100000

## 2014-10-06 MED ORDER — HYDROCODONE-ACETAMINOPHEN 5-325 MG PO TABS: 1.0000 | ORAL_TABLET | Freq: Four times a day (QID) | ORAL | Status: DC | PRN

## 2014-10-06 MED ORDER — AMOXICILLIN-POT CLAVULANATE 875-125 MG PO TABS: 1.0000 | ORAL_TABLET | Freq: Two times a day (BID) | ORAL | Status: DC

## 2014-10-06 NOTE — Care Management (Signed)
Important Message  Patient Details  Name: Ronald [name] MRN: [id number] Date of Birth: May 17, 1927   Medicare Important Message Given:  Yes-third notification given    Delorse Lek 10/06/2014, 3:33 PM

## 2014-10-06 NOTE — Discharge Summary (Addendum)
Physician Discharge Summary  Ronald [name] MRN: [id number] DOB/AGE: [birth year] 81 y.o.  PCP: Nyoka Cowden, MD   Admit date: 10/03/2014 Discharge date: 10/06/2014  Discharge Diagnoses:    Principal Problem:   UTI (urinary tract infection) Active Problems:   HYPERCHOLESTEROLEMIA   HTN (hypertension)   Chronic systolic CHF (congestive heart failure)   Fever    Follow-up recommendations Follow-up with PCP in 3-5 days , including although additional recommended appointments as below Follow-up CBC, CMP in 3-5 days Patient needs to follow-up with urology in 3-5 days because of recurrent bacteremia     Medication List    STOP taking these medications        ibuprofen 200 MG tablet  Commonly known as:  ADVIL,MOTRIN      TAKE these medications        acetaminophen 500 MG tablet  Commonly known as:  TYLENOL  Take 500 mg by mouth every 6 (six) hours as needed for pain or fever. Take 1 -2 pills     amoxicillin-clavulanate 875-125 MG per tablet  Commonly known as:  AUGMENTIN  Take 1 tablet by mouth every 12 (twelve) hours.     aspirin 81 MG EC tablet  Take 1 tablet (81 mg total) by mouth daily.     atorvastatin 80 MG tablet  Commonly known as:  LIPITOR  Take 40 mg by mouth every evening.     CALCIUM + D PO  Take 1 tablet by mouth 2 (two) times daily.     calcium carbonate 500 MG chewable tablet  Commonly known as:  TUMS - dosed in mg elemental calcium  Chew 2 tablets by mouth as needed for indigestion or heartburn.     flavoxATE 100 MG tablet  Commonly known as:  URISPAS  Take 1 tablet (100 mg total) by mouth 3 (three) times daily as needed for bladder spasms.     galantamine 8 MG 24 hr capsule  Commonly known as:  RAZADYNE ER  Take 8 mg by mouth daily with breakfast.     HYDROcodone-acetaminophen 5-325 MG per tablet  Commonly known as:  NORCO/VICODIN  Take 1 tablet by mouth every 6 (six) hours as needed for moderate pain.     hydroxypropyl  methylcellulose / hypromellose 2.5 % ophthalmic solution  Commonly known as:  ISOPTO TEARS / GONIOVISC  Place 1 drop into both eyes 2 (two) times daily.     isosorbide mononitrate 60 MG 24 hr tablet  Commonly known as:  IMDUR  Take 0.5 tablets (30 mg total) by mouth every morning.     multivitamin with minerals Tabs tablet  Take 1 tablet by mouth daily.     nitroGLYCERIN 0.4 MG SL tablet  Commonly known as:  NITROSTAT  Place 0.4 mg under the tongue every 5 (five) minutes as needed for chest pain.     ranitidine 150 MG tablet  Commonly known as:  ZANTAC  Take 150 mg by mouth as needed for heartburn.     RAPAFLO 8 MG Caps capsule  Generic drug:  silodosin  Take 8 mg by mouth every evening.     Vitamin D 2000 UNITS tablet  Take 2,000 Units by mouth daily.         Discharge Condition:  Stable    Disposition: 01-Home or Self Care   Consults: None    Significant Diagnostic Studies:  Ct Pelvis W Contrast  10/05/2014   CLINICAL DATA:  Pelvic and prostatic pain.  Prostate carcinoma.  EXAM:  CT PELVIS WITH CONTRAST  TECHNIQUE: Multidetector CT imaging of the pelvis was performed using the standard protocol following the bolus administration of intravenous contrast.  CONTRAST:  145m OMNIPAQUE IOHEXOL 300 MG/ML  SOLN  COMPARISON:  09/12/2011  FINDINGS: Prostate brachytherapy seeds again demonstrated. Mildly enlarged prostate appears stable. Seminal vesicles are symmetric. Mild diffuse bladder wall thickening is unchanged and may be due to chronic bladder outlet obstruction or radiation changes. Bladder is nondilated.  No evidence of pelvic lymphadenopathy. No other soft tissue masses identified. Severe sigmoid diverticulosis is stable in appearance, and there is no evidence of acute diverticulitis, abscess, or free fluid.  Small bilateral inguinal hernias again seen containing only fat. No suspicious bone lesions identified.  IMPRESSION: Stable mildly enlarged prostate with  brachytherapy seeds. No evidence of pelvic metastatic disease or other acute findings.  Chronic mild diffuse bladder wall thickening, which may be due to chronic bladder outlet obstruction or post radiation changes.  Colonic diverticulosis. No radiographic evidence of diverticulitis.  Stable small bilateral inguinal hernias containing only fat.   Electronically Signed   By: JEarle GellM.D.   On: 10/05/2014 12:37   Dg Chest Port 1 View  10/03/2014   CLINICAL DATA:  81year old male with shortness of breath and fever. Initial encounter.  EXAM: PORTABLE CHEST - 1 VIEW  COMPARISON:  08/27/2013 and earlier.  FINDINGS: Portable AP semi upright view at 1659 hours. Stable lung volumes. Stable cardiac size and mediastinal contours. Sequelae of CABG. Chronic scarring or atelectasis at the left lung base is stable. No pneumothorax or pulmonary edema. Subtle reticulonodular increased density in the right upper lobe. The right lung base is stable.  IMPRESSION: 1. Subtle increased reticulonodular density in the right upper lobe suspicious for developing pneumonia in this setting. No pleural effusion. 2. Stable left lung base scar or atelectasis.   Electronically Signed   By: HGenevie AnnM.D.   On: 10/03/2014 17:10       Filed Weights   10/03/14 1655  Weight: 74.254 kg (163 lb 11.2 oz)     Microbiology: Recent Results (from the past 240 hour(s))  Urine culture     Status: None (Preliminary result)   Collection Time: 10/03/14  1:51 PM  Result Value Ref Range Status   Specimen Description URINE, RANDOM  Final   Special Requests NONE  Final   Culture   Final    >=100,000 COLONIES/mL ESCHERICHIA COLI 30,000 COLONIES/mL PSEUDOMONAS AERUGINOSA    Report Status PENDING  Incomplete   Organism ID, Bacteria ESCHERICHIA COLI  Final      Susceptibility   Escherichia coli - MIC*    AMPICILLIN 4 SENSITIVE Sensitive     CEFAZOLIN <=4 SENSITIVE Sensitive     CEFTRIAXONE <=1 SENSITIVE Sensitive     CIPROFLOXACIN <=0.25  SENSITIVE Sensitive     GENTAMICIN <=1 SENSITIVE Sensitive     IMIPENEM <=0.25 SENSITIVE Sensitive     NITROFURANTOIN <=16 SENSITIVE Sensitive     TRIMETH/SULFA <=20 SENSITIVE Sensitive     AMPICILLIN/SULBACTAM <=2 SENSITIVE Sensitive     PIP/TAZO <=4 SENSITIVE Sensitive     * >=100,000 COLONIES/mL ESCHERICHIA COLI  Culture, blood (routine x 2)     Status: None (Preliminary result)   Collection Time: 10/03/14  2:55 PM  Result Value Ref Range Status   Specimen Description BLOOD RIGHT ARM  Final   Special Requests BOTTLES DRAWN AEROBIC AND ANAEROBIC 10CC  Final   Culture  Setup Time   Final  GRAM VARIABLE ROD ANAEROBIC BOTTLE ONLY CRITICAL RESULT CALLED TO, READ BACK BY AND VERIFIED WITH: K Orvan Falconer 10/04/14 AT 0423 RHOLMES CONFIRMED VERBALLY BY J BEAMAN GRAM NEGATIVE RODS    Culture GRAM NEGATIVE RODS  Final   Report Status PENDING  Incomplete  Culture, blood (routine x 2)     Status: None (Preliminary result)   Collection Time: 10/03/14  3:08 PM  Result Value Ref Range Status   Specimen Description BLOOD RIGHT HAND  Final   Special Requests BOTTLES DRAWN AEROBIC AND ANAEROBIC 10CC  Final   Culture  Setup Time   Final    GRAM VARIABLE ROD ANAEROBIC BOTTLE ONLY CRITICAL RESULT CALLED TO, READ BACK BY AND VERIFIED WITH: K VANNACHITH,RN 10/04/14 AT 0423 RHOLMES CONFIRMED VERBALLY BY J BEAMAN AEROBIC BOTTLE ONLY    Culture GRAM NEGATIVE RODS  Final   Report Status PENDING  Incomplete       Blood Culture    Component Value Date/Time   SDES BLOOD RIGHT HAND 10/03/2014 1508   SPECREQUEST BOTTLES DRAWN AEROBIC AND ANAEROBIC 10CC 10/03/2014 1508   CULT GRAM NEGATIVE RODS 10/03/2014 1508   REPTSTATUS PENDING 10/03/2014 1508      Labs: Results for orders placed or performed during the hospital encounter of 10/03/14 (from the past 48 hour(s))  CBC     Status: Abnormal   Collection Time: 10/05/14  4:32 AM  Result Value Ref Range   WBC 8.7 4.0 - 10.5 K/uL   RBC 4.35 4.22  - 5.81 MIL/uL   Hemoglobin 13.3 13.0 - 17.0 g/dL   HCT 40.2 39.0 - 52.0 %   MCV 92.4 78.0 - 100.0 fL   MCH 30.6 26.0 - 34.0 pg   MCHC 33.1 30.0 - 36.0 g/dL   RDW 14.0 11.5 - 15.5 %   Platelets 61 (L) 150 - 400 K/uL    Comment: CONSISTENT WITH PREVIOUS RESULT  Comprehensive metabolic panel     Status: Abnormal   Collection Time: 10/05/14  4:32 AM  Result Value Ref Range   Sodium 137 135 - 145 mmol/L   Potassium 3.4 (L) 3.5 - 5.1 mmol/L   Chloride 105 101 - 111 mmol/L   CO2 23 22 - 32 mmol/L   Glucose, Bld 115 (H) 65 - 99 mg/dL   BUN 21 (H) 6 - 20 mg/dL   Creatinine, Ser 1.30 (H) 0.61 - 1.24 mg/dL   Calcium 8.4 (L) 8.9 - 10.3 mg/dL   Total Protein 5.4 (L) 6.5 - 8.1 g/dL   Albumin 2.7 (L) 3.5 - 5.0 g/dL   AST 23 15 - 41 U/L   ALT 24 17 - 63 U/L   Alkaline Phosphatase 59 38 - 126 U/L   Total Bilirubin 1.1 0.3 - 1.2 mg/dL   GFR calc non Af Amer 48 (L) >60 mL/min   GFR calc Af Amer 56 (L) >60 mL/min    Comment: (NOTE) The eGFR has been calculated using the CKD EPI equation. This calculation has not been validated in all clinical situations. eGFR's persistently <60 mL/min signify possible Chronic Kidney Disease.    Anion gap 9 5 - 15  CBC     Status: Abnormal   Collection Time: 10/06/14  4:46 AM  Result Value Ref Range   WBC 5.7 4.0 - 10.5 K/uL   RBC 4.49 4.22 - 5.81 MIL/uL   Hemoglobin 13.4 13.0 - 17.0 g/dL   HCT 40.8 39.0 - 52.0 %   MCV 90.9 78.0 - 100.0 fL  MCH 29.8 26.0 - 34.0 pg   MCHC 32.8 30.0 - 36.0 g/dL   RDW 13.9 11.5 - 15.5 %   Platelets 74 (L) 150 - 400 K/uL    Comment: CONSISTENT WITH PREVIOUS RESULT  Comprehensive metabolic panel     Status: Abnormal   Collection Time: 10/06/14  4:46 AM  Result Value Ref Range   Sodium 134 (L) 135 - 145 mmol/L   Potassium 3.9 3.5 - 5.1 mmol/L   Chloride 104 101 - 111 mmol/L   CO2 22 22 - 32 mmol/L   Glucose, Bld 113 (H) 65 - 99 mg/dL   BUN 22 (H) 6 - 20 mg/dL   Creatinine, Ser 1.22 0.61 - 1.24 mg/dL   Calcium 8.3 (L)  8.9 - 10.3 mg/dL   Total Protein 5.5 (L) 6.5 - 8.1 g/dL   Albumin 2.7 (L) 3.5 - 5.0 g/dL   AST 37 15 - 41 U/L   ALT 33 17 - 63 U/L   Alkaline Phosphatase 76 38 - 126 U/L   Total Bilirubin 1.0 0.3 - 1.2 mg/dL   GFR calc non Af Amer 52 (L) >60 mL/min   GFR calc Af Amer >60 >60 mL/min    Comment: (NOTE) The eGFR has been calculated using the CKD EPI equation. This calculation has not been validated in all clinical situations. eGFR's persistently <60 mL/min signify possible Chronic Kidney Disease.    Anion gap 8 5 - 15     Lipid Panel     Component Value Date/Time   CHOL 126 08/28/2013 0020   TRIG 34 08/28/2013 0020   HDL 54 08/28/2013 0020   CHOLHDL 2.3 08/28/2013 0020   VLDL 7 08/28/2013 0020   LDLCALC 65 08/28/2013 0020     Lab Results  Component Value Date   HGBA1C 6.0* 08/27/2013     Lab Results  Component Value Date   LDLCALC 65 08/28/2013   CREATININE 1.22 10/06/2014     HPI :Ronald [name] is a 81 y.o. male has a past medical history significant for hyperlipidemia, hypertension, coronary artery disease, history of ischemic cardiomyopathy and chronic systolic heart failure with an ejection fraction of 45-50%, recurrent urinary tract infections, history of prostate cancer, presents to the emergency room with a chief complaint of fever, weakness, dysuria, progressively worsened for the past 3 days. Patient has a history of recurrent urinary tract infections, and he was hospitalized just a month ago where he had Escherichia coli bacteremia. Since his discharge, he has been doing well, up until 3 days ago. He denies any chest pains, denies any shortness of breath, he denies any abdominal pain, nausea, vomiting or diarrhea. He has remained pretty active and is still driving. He denies any lightheadedness or dizziness. He just feels "weak". He hasn't been able to do his normal things around the house. He is also complaining of generalized body aches. In the emergency room,  patient was found to be febrile to 101, he is not tachycardic nor hypotensive, he was found to have a leukocytosis of 12.9, his lactic acid is normal and the urinalysis is clearly positive for UTI. [facility] was asked for admission for urinary tract infection.    HOSPITAL COURSE:  UTI with gram-negative bacteremia Blood culture showed gram variable Rod, gram-negative rod, initiated on Rocephin upon admission, switch to Zosyn Based on speciation and sensitivity, patient has been switched to Augmentin, [allergic to ciprofloxacin] improving white count from 16.4-8.7, patient nontoxic and afebrile Given recurrent UTIs, CT scan of  the pelvis was done which was negative for prostatic abscess, shows chronic bladder wall thickening, could patient have a urethral stricture? patient does have an outpatient urologist Dr. Brown Human, but would like to establish with a new urologist, patient should see urologist in 3-5 days   Chronic systolic heart failure - Most recent 2-D echo was done in October 2015, showed an ejection fraction of 45-50% - Patient currently has no evidence of fluid overload, minimize IV fluids   CAD - no chest pain, monitor in telemetry  - resume home medications  Thrombocytopenia  - This is chronic, dating back as far as 2008, his platelets are close to baseline    Acute kidney injury, likely prerenal, creatinine had gone up to -1.33 Now improving  Discharge Exam: *   Blood pressure 109/52, pulse 52, temperature 98.2 F (36.8 C), temperature source Oral, resp. rate 20, height _0  (1.651 m), weight 74.254 kg (163 lb 11.2 oz), SpO2 96 %.  General: No acute respiratory distress Lungs: Clear to auscultation bilaterally without wheezes or crackles Cardiovascular: Regular rate and rhythm without murmur gallop or rub normal S1 and S2 Abdomen: Nontender, nondistended, soft, bowel sounds positive, no rebound, no ascites, no appreciable mass Extremities: No significant cyanosis,  clubbing, or edema bilateral lower extremities       Discharge Instructions    Diet - low sodium heart healthy    Complete by:  As directed      Increase activity slowly    Complete by:  As directed            Follow-up Information    Follow up with Nyoka Cowden, MD. Schedule an appointment as soon as possible for a visit in 3 days.   Specialty:  Internal Medicine   Contact information:   [place] [address] [phone]       Follow up with Urology. Schedule an appointment as soon as possible for a visit in 1 week.   Why:  Recurrent Escherichia coli bacteremia likely urinary in origin      Signed: Tayler Lassen 10/06/2014, 8:33 AM        Time spent >45 mins

## 2014-10-06 NOTE — Progress Notes (Signed)
Patient discharge teaching given, including activity, diet, follow-up appoints, and medications. Patient verbalized understanding of all discharge instructions. IV access was d/c'd. Vitals are stable. Skin is intact except as charted in most recent assessments. Pt to be escorted out by NT, to be driven home by family. 

## 2014-10-06 NOTE — Care Management Note (Signed)
Case Management Note  Patient Details  Name: ARDIAN NAWAZ MRN: [id number] Date of Birth: 22-Oct-1927  Subjective/Objective:                 Discharge to home with spouse.  Action/Plan:  Follow up made with [facility] per patient request not with Dr Risa Grill.  Expected Discharge Date:                  Expected Discharge Plan:  Home/Self Care  In-House Referral:  Clinical Social Work  Discharge planning Services  CM Consult  Post Acute Care Choice:    Choice offered to:     DME Arranged:    DME Agency:     HH Arranged:    [place] Agency:     Status of Service:  Completed, signed off  Medicare Important Message Given:    Date Medicare IM Given:    Medicare IM give by:    Date Additional Medicare IM Given:    Additional Medicare Important Message give by:     If discussed at [place] of Stay Meetings, dates discussed:    Additional Comments:  Carles Collet, RN 10/06/2014, 11:13 AM

## 2014-10-07 LAB — CULTURE, BLOOD (ROUTINE X 2)

## 2014-10-14 ENCOUNTER — Ambulatory Visit (INDEPENDENT_AMBULATORY_CARE_PROVIDER_SITE_OTHER): Payer: Medicare Other | Admitting: Internal Medicine

## 2014-10-14 ENCOUNTER — Encounter: Payer: Self-pay | Admitting: Internal Medicine

## 2014-10-14 VITALS — BP 120/70 | HR 54 | Temp 97.7°F | Resp 20 | Ht 65.0 in | Wt 161.0 lb

## 2014-10-14 DIAGNOSIS — A419 Sepsis, unspecified organism: Secondary | ICD-10-CM

## 2014-10-14 DIAGNOSIS — I10 Essential (primary) hypertension: Secondary | ICD-10-CM

## 2014-10-14 DIAGNOSIS — I5022 Chronic systolic (congestive) heart failure: Secondary | ICD-10-CM

## 2014-10-14 DIAGNOSIS — R6510 Systemic inflammatory response syndrome (SIRS) of non-infectious origin without acute organ dysfunction: Secondary | ICD-10-CM

## 2014-10-14 DIAGNOSIS — N359 Urethral stricture, unspecified: Secondary | ICD-10-CM

## 2014-10-14 NOTE — Patient Instructions (Signed)
Take your antibiotic as prescribed until ALL of it is gone, but stop if you develop a rash, swelling, or any side effects of the medication.  Contact our office as soon as possible if  there are side effects of the medication.  Return in 3 months for follow-up   Urology follow-up as scheduled

## 2014-10-14 NOTE — Progress Notes (Signed)
Pre visit review using our clinic review tool, if applicable. No additional management support is needed unless otherwise documented below in the visit note. 

## 2014-10-14 NOTE — Progress Notes (Signed)
Subjective:    Patient ID: Ronald [name], male    DOB: [birth year], 82 y.o.   MRN: [id number]  HPI  Admit date: 10/03/2014 Discharge date: 10/06/2014  Discharge Diagnoses:    Principal Problem:  UTI (urinary tract infection) Active Problems:  HYPERCHOLESTEROLEMIA  HTN (hypertension)  Chronic systolic CHF (congestive heart failure)  Fever    Follow-up recommendations Follow-up with PCP in 3-5 days , including although additional recommended appointments as below Follow-up CBC, CMP in 3-5 days Patient needs to follow-up with urology in 3-5 days because of recurrent bacteremia  81 year old patient who was seen today following a recent hospital discharge for Escherichia coli UTI with bacteremia.  He was hospitalized last month also for urosepsis related to Escherichia coli and also in the fall of last year.  He is completing the antibiotic therapy and is scheduled to see urology tomorrow.  He does have a history of urethral stricture. Since his discharge, he has done well.  He has tolerated the medication without difficulties. No fever or chills  He does complain of some urinary frequency  Past Medical History  Diagnosis Date  . Hyperlipidemia   . S/P CABG (coronary artery bypass graft)     [place]  . History of Bell's palsy     RIGHT SIDE-- NO RESIDUAL  . Gross hematuria   . Urethral stricture   . OAB (overactive bladder)   . Chronic cystitis   . Ischemic cardiomyopathy     ef 35-40% per cath 08-28-2013  . Coronary artery disease CARDIOLOGIST-  DR Grace Bushy  MI -- S/P  11/89 CABG x6 (70% circ; 90% PD; 60-70% distal left main; 30% left circ; 90% 1st diag;, 2nd diag and 3rd diag. w/90%,  mild stenosis LAD and 60-70% pLAD)  Re-do CABG x5 in 1997  . History of nonmelanoma skin cancer     excision's from scalp  . Wears hearing aid     bilateral  . Wears partial dentures   . At risk for sleep apnea     STOP-BANG= 4    SENT TO PCP 12-23-2013  . GERD  (gastroesophageal reflux disease)   . Mild dementia   . Kidney stones "years ago"    "passed them"  . Hypertension     "not anymore" (01/02/2014)  . Myocardial infarction 1986; 1997  . Arthritis     "joints ache" (01/02/2014)  . Prostate cancer 1998    S/P  RADIACTIVE SEED IMPLANTS; UROLOGIST-  DR GRAPEY  . Melanoma of ear     "right"    History   Social History  . Marital Status: Married    Spouse Name: N/A  . Number of Children: N/A  . Years of Education: N/A   Occupational History  . Not on file.   Social History Main Topics  . Smoking status: Former Smoker -- 40 years    Types: Cigars    Quit date: 12/24/1983  . Smokeless tobacco: Former Systems developer    Types: Chew    Quit date: 08/28/2013  . Alcohol Use: No  . Drug Use: No  . Sexual Activity: Not on file   Other Topics Concern  . Not on file   Social History Narrative    Past Surgical History  Procedure Laterality Date  . Eus  10/05/2011    Procedure: ESOPHAGEAL ENDOSCOPIC ULTRASOUND (EUS) RADIAL;  Surgeon: Arta Silence, MD;  Location: [facility];  Service: Endoscopy;  Laterality:  N/A;  . Tonsillectomy and adenoidectomy  1954  . Laparoscopic cholecystectomy  12-23-2005  . Radioactive prostate seed implants  1998  . Coronary artery bypass graft  11/ 1989  &  10/ 1997    1989-- 6 vessel/  1997 Re-do 5 vessel  . Cardiovascular stress test  08-01-2011  dr Angelena Form    inferior scar and possible soft tissue attenuation with minimal peri-infarct ischemia, small region of anterior ischemia and scar/  LVEF 53% LV wall motion with inferior hypokinesis/ no significant change from scan july 2011  . Cataract extraction w/ intraocular lens  implant, bilateral Bilateral 2007  . Cystoscopy with urethral dilatation N/A 12/25/2013    Procedure: CYSTOSCOPY WITH URETHRAL DILATATION, WITH BIOPSY;  Surgeon: Bernestine Amass, MD;  Location: [facility];  Service: Urology;  Laterality: N/A;  . Melanoma excision  X 1     "ear"  . Cardiac catheterization  02-22-2005  dr Vidal Schwalbe    mild to moderate lv dysfunction with inferobasilar akinesis/  totally occluded SVG to Intermediate Diagonal and SVG to PDA and RCA branches, totally occluded native coronary circulation with diffuse disease pLAD diagonal system with potentially could be ischemic/  patent SVG to OM with collaterals to dRCA and patent LIMA to LAD and diagonal systemss  . Cardiac catheterization  08-28-2013  DR Daneen Schick    widely patent sequential left internal graft to the diagonal/LAD, widely patent SVG to OM with proximal 50% narrowing noted in the graft, total occlusion SVG's to RCA, RI, and the Diagonal/ LV dysfunction with inferobasal aneurysm and mid anterior wall region of akinesis/  overall EF 35-40%/  Total occlusion of the navtive circulation/  no significant change compared to 2006 cath  . Skin cancer excision  X 2    "top of head"  . Left heart catheterization with coronary/graft angiogram N/A 08/28/2013    Procedure: LEFT HEART CATHETERIZATION WITH Beatrix Fetters;  Surgeon: Sinclair Grooms, MD;  Location: [facility] C[facility] LAB;  Service: Cardiovascular;  Laterality: N/A;    Family History  Problem Relation Age of Onset  . Heart disease Mother   . Diabetes Father   . Heart disease Brother     Allergies  Allergen Reactions  . Pantoprazole Other (See Comments)    Headache and lightheaded  . Ciprofloxacin Swelling and Hives    Allergy classified as swelling here, but pt says Cipro just doesn't work for him    . Nitrofurantoin Hives  . Sulfa Antibiotics Hives and Itching    Current Outpatient Prescriptions on File Prior to Visit  Medication Sig Dispense Refill  . acetaminophen (TYLENOL) 500 MG tablet Take 500 mg by mouth every 6 (six) hours as needed for pain or fever. Take 1 -2 pills    . amoxicillin-clavulanate (AUGMENTIN) 875-125 MG per tablet Take 1 tablet by mouth every 12 (twelve) hours. 20 tablet 0  . aspirin EC 81 MG EC  tablet Take 1 tablet (81 mg total) by mouth daily. 30 tablet 0  . atorvastatin (LIPITOR) 80 MG tablet Take 40 mg by mouth every evening.    . calcium carbonate (TUMS - DOSED IN MG ELEMENTAL CALCIUM) 500 MG chewable tablet Chew 2 tablets by mouth as needed for indigestion or heartburn.    . Calcium Carbonate-Vitamin D (CALCIUM + D PO) Take 1 tablet by mouth 2 (two) times daily.     . Cholecalciferol (VITAMIN D) 2000 UNITS tablet Take 2,000 Units by mouth daily.    . flavoxATE (  URISPAS) 100 MG tablet Take 1 tablet (100 mg total) by mouth 3 (three) times daily as needed for bladder spasms. 60 tablet 3  . galantamine (RAZADYNE ER) 8 MG 24 hr capsule Take 8 mg by mouth daily with breakfast.    . HYDROcodone-acetaminophen (NORCO/VICODIN) 5-325 MG per tablet Take 1 tablet by mouth every 6 (six) hours as needed for moderate pain. 30 tablet 0  . hydroxypropyl methylcellulose (ISOPTO TEARS) 2.5 % ophthalmic solution Place 1 drop into both eyes 2 (two) times daily.     . isosorbide mononitrate (IMDUR) 60 MG 24 hr tablet Take 0.5 tablets (30 mg total) by mouth every morning.    . Multiple Vitamin (MULTIVITAMIN WITH MINERALS) TABS Take 1 tablet by mouth daily.    . nitroGLYCERIN (NITROSTAT) 0.4 MG SL tablet Place 0.4 mg under the tongue every 5 (five) minutes as needed for chest pain.     . ranitidine (ZANTAC) 150 MG tablet Take 150 mg by mouth as needed for heartburn.    . silodosin (RAPAFLO) 8 MG CAPS capsule Take 8 mg by mouth every evening.      No current facility-administered medications on file prior to visit.    BP 120/70 mmHg  Pulse 54  Temp(Src) 97.7 F (36.5 C) (Oral)  Resp 20  Ht 5\' 5"  (1.651 m)  Wt 161 lb (73.029 kg)  BMI 26.79 kg/m2  SpO2 96%     Review of Systems  Constitutional: Negative for fever, chills, appetite change and fatigue.  HENT: Negative for congestion, dental problem, ear pain, hearing loss, sore throat, tinnitus, trouble swallowing and voice change.   Eyes:  Negative for pain, discharge and visual disturbance.  Respiratory: Negative for cough, chest tightness, wheezing and stridor.   Cardiovascular: Negative for chest pain, palpitations and leg swelling.  Gastrointestinal: Negative for nausea, vomiting, abdominal pain, diarrhea, constipation, blood in stool and abdominal distention.  Genitourinary: Positive for frequency. Negative for urgency, hematuria, flank pain, discharge, difficulty urinating and genital sores.  Musculoskeletal: Negative for myalgias, back pain, joint swelling, arthralgias, gait problem and neck stiffness.  Skin: Negative for rash.  Neurological: Positive for weakness. Negative for dizziness, syncope, speech difficulty, numbness and headaches.  Hematological: Negative for adenopathy. Does not bruise/bleed easily.  Psychiatric/Behavioral: Negative for behavioral problems and dysphoric mood. The patient is not nervous/anxious.        Objective:   Physical Exam  Constitutional: He is oriented to person, place, and time. He appears well-developed.  HENT:  Head: Normocephalic.  Right Ear: External ear normal.  Left Ear: External ear normal.  No thrush  Eyes: Conjunctivae and EOM are normal.  Neck: Normal range of motion.  Cardiovascular: Normal rate, regular rhythm and normal heart sounds.   Pulse slow and regular Diminished right dorsalis pedis pulse.  Other pedal pulses full  Pulmonary/Chest: Breath sounds normal.  Abdominal: Bowel sounds are normal.  Musculoskeletal: Normal range of motion. He exhibits edema. He exhibits no tenderness.  Left ankle and foot slightly edematous  Neurological: He is alert and oriented to person, place, and time.  Psychiatric: He has a normal mood and affect. His behavior is normal.          Assessment & Plan:   Recurrent urosepsis.  Patient to complete antibiotic therapy Urethral stricture disease.  Follow-up urology.  May require dilatation Coronary artery disease Hypertension,  stable Chronic systolic heart failure.  Compensated History of prostate cancer  Recheck 3 months

## 2014-10-15 DIAGNOSIS — N359 Urethral stricture, unspecified: Secondary | ICD-10-CM | POA: Diagnosis not present

## 2014-10-15 DIAGNOSIS — N390 Urinary tract infection, site not specified: Secondary | ICD-10-CM | POA: Diagnosis not present

## 2014-10-15 DIAGNOSIS — N3020 Other chronic cystitis without hematuria: Secondary | ICD-10-CM | POA: Diagnosis not present

## 2014-11-24 DIAGNOSIS — N3020 Other chronic cystitis without hematuria: Secondary | ICD-10-CM | POA: Diagnosis not present

## 2014-12-02 DIAGNOSIS — Z8546 Personal history of malignant neoplasm of prostate: Secondary | ICD-10-CM | POA: Diagnosis not present

## 2014-12-02 DIAGNOSIS — N3020 Other chronic cystitis without hematuria: Secondary | ICD-10-CM | POA: Diagnosis not present

## 2014-12-02 DIAGNOSIS — N359 Urethral stricture, unspecified: Secondary | ICD-10-CM | POA: Diagnosis not present

## 2014-12-02 DIAGNOSIS — N3281 Overactive bladder: Secondary | ICD-10-CM | POA: Diagnosis not present

## 2014-12-04 ENCOUNTER — Encounter (HOSPITAL_COMMUNITY): Payer: Self-pay | Admitting: *Deleted

## 2014-12-04 ENCOUNTER — Inpatient Hospital Stay (HOSPITAL_COMMUNITY)
Admission: EM | Admit: 2014-12-04 | Discharge: 2014-12-07 | DRG: 872 | Disposition: A | Discharge status: Home Health | Payer: Medicare Other | Attending: Internal Medicine | Admitting: Internal Medicine

## 2014-12-04 ENCOUNTER — Emergency Department (HOSPITAL_COMMUNITY): Payer: Medicare Other

## 2014-12-04 DIAGNOSIS — I252 Old myocardial infarction: Secondary | ICD-10-CM

## 2014-12-04 DIAGNOSIS — R531 Weakness: Secondary | ICD-10-CM | POA: Diagnosis not present

## 2014-12-04 DIAGNOSIS — Z23 Encounter for immunization: Secondary | ICD-10-CM

## 2014-12-04 DIAGNOSIS — I5022 Chronic systolic (congestive) heart failure: Secondary | ICD-10-CM | POA: Diagnosis not present

## 2014-12-04 DIAGNOSIS — Z923 Personal history of irradiation: Secondary | ICD-10-CM

## 2014-12-04 DIAGNOSIS — D696 Thrombocytopenia, unspecified: Secondary | ICD-10-CM | POA: Diagnosis present

## 2014-12-04 DIAGNOSIS — I255 Ischemic cardiomyopathy: Secondary | ICD-10-CM | POA: Diagnosis present

## 2014-12-04 DIAGNOSIS — N499 Inflammatory disorder of unspecified male genital organ: Secondary | ICD-10-CM | POA: Diagnosis not present

## 2014-12-04 DIAGNOSIS — K219 Gastro-esophageal reflux disease without esophagitis: Secondary | ICD-10-CM | POA: Diagnosis present

## 2014-12-04 DIAGNOSIS — Z9049 Acquired absence of other specified parts of digestive tract: Secondary | ICD-10-CM | POA: Diagnosis present

## 2014-12-04 DIAGNOSIS — N390 Urinary tract infection, site not specified: Secondary | ICD-10-CM | POA: Diagnosis not present

## 2014-12-04 DIAGNOSIS — R001 Bradycardia, unspecified: Secondary | ICD-10-CM | POA: Diagnosis present

## 2014-12-04 DIAGNOSIS — Z7982 Long term (current) use of aspirin: Secondary | ICD-10-CM

## 2014-12-04 DIAGNOSIS — B965 Pseudomonas (aeruginosa) (mallei) (pseudomallei) as the cause of diseases classified elsewhere: Secondary | ICD-10-CM | POA: Diagnosis present

## 2014-12-04 DIAGNOSIS — I2510 Atherosclerotic heart disease of native coronary artery without angina pectoris: Secondary | ICD-10-CM | POA: Diagnosis present

## 2014-12-04 DIAGNOSIS — D649 Anemia, unspecified: Secondary | ICD-10-CM | POA: Diagnosis present

## 2014-12-04 DIAGNOSIS — Z882 Allergy status to sulfonamides status: Secondary | ICD-10-CM

## 2014-12-04 DIAGNOSIS — Z951 Presence of aortocoronary bypass graft: Secondary | ICD-10-CM

## 2014-12-04 DIAGNOSIS — I959 Hypotension, unspecified: Secondary | ICD-10-CM | POA: Diagnosis not present

## 2014-12-04 DIAGNOSIS — N3281 Overactive bladder: Secondary | ICD-10-CM | POA: Diagnosis not present

## 2014-12-04 DIAGNOSIS — R509 Fever, unspecified: Secondary | ICD-10-CM | POA: Diagnosis present

## 2014-12-04 DIAGNOSIS — Z79899 Other long term (current) drug therapy: Secondary | ICD-10-CM

## 2014-12-04 DIAGNOSIS — J101 Influenza due to other identified influenza virus with other respiratory manifestations: Secondary | ICD-10-CM | POA: Diagnosis present

## 2014-12-04 DIAGNOSIS — Z85820 Personal history of malignant melanoma of skin: Secondary | ICD-10-CM

## 2014-12-04 DIAGNOSIS — Z85828 Personal history of other malignant neoplasm of skin: Secondary | ICD-10-CM

## 2014-12-04 DIAGNOSIS — N35919 Unspecified urethral stricture, male, unspecified site: Secondary | ICD-10-CM | POA: Diagnosis present

## 2014-12-04 DIAGNOSIS — Z8546 Personal history of malignant neoplasm of prostate: Secondary | ICD-10-CM

## 2014-12-04 DIAGNOSIS — N359 Urethral stricture, unspecified: Secondary | ICD-10-CM | POA: Diagnosis present

## 2014-12-04 DIAGNOSIS — D72829 Elevated white blood cell count, unspecified: Secondary | ICD-10-CM | POA: Diagnosis present

## 2014-12-04 DIAGNOSIS — Z888 Allergy status to other drugs, medicaments and biological substances status: Secondary | ICD-10-CM

## 2014-12-04 DIAGNOSIS — T83510A Infection and inflammatory reaction due to cystostomy catheter, initial encounter: Secondary | ICD-10-CM

## 2014-12-04 DIAGNOSIS — Z87440 Personal history of urinary (tract) infections: Secondary | ICD-10-CM

## 2014-12-04 DIAGNOSIS — I10 Essential (primary) hypertension: Secondary | ICD-10-CM | POA: Diagnosis present

## 2014-12-04 DIAGNOSIS — Z87442 Personal history of urinary calculi: Secondary | ICD-10-CM

## 2014-12-04 DIAGNOSIS — A4152 Sepsis due to Pseudomonas: Principal | ICD-10-CM | POA: Diagnosis present

## 2014-12-04 DIAGNOSIS — E785 Hyperlipidemia, unspecified: Secondary | ICD-10-CM | POA: Diagnosis present

## 2014-12-04 DIAGNOSIS — Z881 Allergy status to other antibiotic agents status: Secondary | ICD-10-CM

## 2014-12-04 DIAGNOSIS — R404 Transient alteration of awareness: Secondary | ICD-10-CM | POA: Diagnosis not present

## 2014-12-04 DIAGNOSIS — Z87891 Personal history of nicotine dependence: Secondary | ICD-10-CM

## 2014-12-04 DIAGNOSIS — M1990 Unspecified osteoarthritis, unspecified site: Secondary | ICD-10-CM | POA: Diagnosis present

## 2014-12-04 LAB — COMPREHENSIVE METABOLIC PANEL
ALT: 20 U/L (ref 17–63)
AST: 27 U/L (ref 15–41)
Albumin: 3.8 g/dL (ref 3.5–5.0)
Alkaline Phosphatase: 55 U/L (ref 38–126)
Anion gap: 10 (ref 5–15)
BUN: 20 mg/dL (ref 6–20)
CO2: 23 mmol/L (ref 22–32)
Calcium: 9 mg/dL (ref 8.9–10.3)
Chloride: 104 mmol/L (ref 101–111)
Creatinine, Ser: 1.16 mg/dL (ref 0.61–1.24)
GFR calc Af Amer: >60 mL/min (ref >60)
GFR calc non Af Amer: 56 mL/min — ABNORMAL LOW (ref >60)
Glucose, Bld: 159 mg/dL — ABNORMAL HIGH (ref 65–99)
Potassium: 3.9 mmol/L (ref 3.5–5.1)
Sodium: 137 mmol/L (ref 135–145)
Total Bilirubin: 1.1 mg/dL (ref 0.3–1.2)
Total Protein: 6.4 g/dL — ABNORMAL LOW (ref 6.5–8.1)

## 2014-12-04 LAB — CBC WITH DIFFERENTIAL/PLATELET
Basophils Absolute: 0 10*3/uL (ref 0.0–0.1)
Basophils Relative: 0 % (ref 0–1)
Eosinophils Absolute: 0 10*3/uL (ref 0.0–0.7)
Eosinophils Relative: 0 % (ref 0–5)
HCT: 41.3 % (ref 39.0–52.0)
Hemoglobin: 13.8 g/dL (ref 13.0–17.0)
Lymphocytes Relative: 5 % — ABNORMAL LOW (ref 12–46)
Lymphs Abs: 0.7 10*3/uL (ref 0.7–4.0)
MCH: 30.6 pg (ref 26.0–34.0)
MCHC: 33.4 g/dL (ref 30.0–36.0)
MCV: 91.6 fL (ref 78.0–100.0)
Monocytes Absolute: 0.9 10*3/uL (ref 0.1–1.0)
Monocytes Relative: 6 % (ref 3–12)
Neutro Abs: 12.4 10*3/uL — ABNORMAL HIGH (ref 1.7–7.7)
Neutrophils Relative %: 89 % — ABNORMAL HIGH (ref 43–77)
Platelets: 91 10*3/uL — ABNORMAL LOW (ref 150–400)
RBC: 4.51 MIL/uL (ref 4.22–5.81)
RDW: 14.1 % (ref 11.5–15.5)
WBC: 14 10*3/uL — ABNORMAL HIGH (ref 4.0–10.5)

## 2014-12-04 LAB — LIPASE, BLOOD: Lipase: 31 U/L (ref 22–51)

## 2014-12-04 LAB — I-STAT CG4 LACTIC ACID, ED: Lactic Acid, Venous: 1.3 mmol/L (ref 0.5–2.0)

## 2014-12-04 MED ORDER — SODIUM CHLORIDE 0.9 % IV BOLUS (SEPSIS)
1000.0000 mL | Freq: Once | INTRAVENOUS | Status: AC
  Administered 2014-12-04: 1000 mL via INTRAVENOUS

## 2014-12-04 MED ORDER — DEXTROSE 5 % IV SOLN
1.0000 g | Freq: Once | INTRAVENOUS | Status: AC
  Administered 2014-12-05: 1 g via INTRAVENOUS
  Filled 2014-12-04: qty 10

## 2014-12-04 NOTE — ED Provider Notes (Signed)
CSN: [id number]     Arrival date & time 12/04/14  1843 History   First MD Initiated Contact with Patient 12/04/14 2138     Chief Complaint  Patient presents with  . Groin Pain  . Urinary Retention   Patient is a 81 y.o. male presenting with general illness. The history is provided by the patient and a relative. No language interpreter was used.  Illness Location:  Suprapubis Quality:  Pain, urinary symptoms Severity:  Moderate Onset quality:  Gradual Timing:  Constant Progression:  Worsening Chronicity:  Recurrent Context:  PMHx presenting with generalized weakness and urinary symptoms. Patient with known prostate cancer status post brachytherapy. Has been admitted to the hospital for 5 times over past year for UTIs and bacteremia. Most recent in July of this year. Worsening urinary frequency and urgency associated with suprapubic pain. Generalized weakness as well. Associated symptoms: abdominal pain   Associated symptoms: no congestion, no diarrhea, no fever, no nausea and no vomiting     Past Medical History  Diagnosis Date  . Hyperlipidemia   . S/P CABG (coronary artery bypass graft)     [place]  . History of Bell's palsy     RIGHT SIDE-- NO RESIDUAL  . Gross hematuria   . Urethral stricture   . OAB (overactive bladder)   . Chronic cystitis   . Ischemic cardiomyopathy     ef 35-40% per cath 08-28-2013  . Coronary artery disease CARDIOLOGIST-  DR Grace Bushy  MI -- S/P  11/89 CABG x6 (70% circ; 90% PD; 60-70% distal left main; 30% left circ; 90% 1st diag;, 2nd diag and 3rd diag. w/90%,  mild stenosis LAD and 60-70% pLAD)  Re-do CABG x5 in 1997  . History of nonmelanoma skin cancer     excision's from scalp  . Wears hearing aid     bilateral  . Wears partial dentures   . At risk for sleep apnea     STOP-BANG= 4    SENT TO PCP 12-23-2013  . GERD (gastroesophageal reflux disease)   . Mild dementia   . Kidney stones "years ago"    "passed them"  .  Hypertension     "not anymore" (01/02/2014)  . Myocardial infarction 1986; 1997  . Arthritis     "joints ache" (01/02/2014)  . Prostate cancer 1998    S/P  RADIACTIVE SEED IMPLANTS; UROLOGIST-  DR GRAPEY  . Melanoma of ear     "right"   Past Surgical History  Procedure Laterality Date  . Eus  10/05/2011    Procedure: ESOPHAGEAL ENDOSCOPIC ULTRASOUND (EUS) RADIAL;  Surgeon: Arta Silence, MD;  Location: [facility];  Service: Endoscopy;  Laterality: N/A;  . Tonsillectomy and adenoidectomy  1954  . Laparoscopic cholecystectomy  12-23-2005  . Radioactive prostate seed implants  1998  . Coronary artery bypass graft  11/ 1989  &  10/ 1997    1989-- 6 vessel/  1997 Re-do 5 vessel  . Cardiovascular stress test  08-01-2011  dr Angelena Form    inferior scar and possible soft tissue attenuation with minimal peri-infarct ischemia, small region of anterior ischemia and scar/  LVEF 53% LV wall motion with inferior hypokinesis/ no significant change from scan july 2011  . Cataract extraction w/ intraocular lens  implant, bilateral Bilateral 2007  . Cystoscopy with urethral dilatation N/A 12/25/2013    Procedure: CYSTOSCOPY WITH URETHRAL DILATATION, WITH BIOPSY;  Surgeon: Bernestine Amass, MD;  Location:  [facility];  Service: Urology;  Laterality: N/A;  . Melanoma excision  X 1    "ear"  . Cardiac catheterization  02-22-2005  dr Vidal Schwalbe    mild to moderate lv dysfunction with inferobasilar akinesis/  totally occluded SVG to Intermediate Diagonal and SVG to PDA and RCA branches, totally occluded native coronary circulation with diffuse disease pLAD diagonal system with potentially could be ischemic/  patent SVG to OM with collaterals to dRCA and patent LIMA to LAD and diagonal systemss  . Cardiac catheterization  08-28-2013  DR Daneen Schick    widely patent sequential left internal graft to the diagonal/LAD, widely patent SVG to OM with proximal 50% narrowing noted in the graft, total occlusion  SVG's to RCA, RI, and the Diagonal/ LV dysfunction with inferobasal aneurysm and mid anterior wall region of akinesis/  overall EF 35-40%/  Total occlusion of the navtive circulation/  no significant change compared to 2006 cath  . Skin cancer excision  X 2    "top of head"  . Left heart catheterization with coronary/graft angiogram N/A 08/28/2013    Procedure: LEFT HEART CATHETERIZATION WITH Beatrix Fetters;  Surgeon: Sinclair Grooms, MD;  Location: [facility] C[facility] LAB;  Service: Cardiovascular;  Laterality: N/A;   Family History  Problem Relation Age of Onset  . Heart disease Mother   . Diabetes Father   . Heart disease Brother    Social History  Substance Use Topics  . Smoking status: Former Smoker -- 40 years    Types: Cigars    Quit date: 12/24/1983  . Smokeless tobacco: Former Systems developer    Types: Chew    Quit date: 08/28/2013  . Alcohol Use: No    Review of Systems  Constitutional: Negative for fever.  HENT: Negative for congestion.   Gastrointestinal: Positive for abdominal pain. Negative for nausea, vomiting and diarrhea.  Genitourinary: Positive for urgency and frequency.  All other systems reviewed and are negative.   Allergies  Pantoprazole; Nitrofurantoin; and Sulfa antibiotics  Home Medications   Prior to Admission medications   Medication Sig Start Date End Date Taking? Authorizing Provider  acetaminophen (TYLENOL) 500 MG tablet Take 500 mg by mouth every 6 (six) hours as needed for pain or fever.    Yes Historical Provider, MD  aspirin EC 81 MG EC tablet Take 1 tablet (81 mg total) by mouth daily. 08/29/13  Yes Brittainy Erie Noe, PA-C  atorvastatin (LIPITOR) 80 MG tablet Take 40 mg by mouth every evening. 09/23/13  Yes Imogene Burn, PA-C  calcium carbonate (TUMS - DOSED IN MG ELEMENTAL CALCIUM) 500 MG chewable tablet Chew 2 tablets by mouth as needed for indigestion or heartburn.   Yes Historical Provider, MD  Calcium Carbonate-Vitamin D (CALCIUM + D PO) Take 1  tablet by mouth 2 (two) times daily.    Yes Historical Provider, MD  Cholecalciferol (VITAMIN D) 2000 UNITS tablet Take 2,000 Units by mouth daily.   Yes Historical Provider, MD  flavoxATE (URISPAS) 100 MG tablet Take 1 tablet (100 mg total) by mouth 3 (three) times daily as needed for bladder spasms. 09/12/14  Yes Ripudeep Krystal Eaton, MD  galantamine (RAZADYNE ER) 8 MG 24 hr capsule Take 8 mg by mouth daily with breakfast.   Yes Historical Provider, MD  HYDROcodone-acetaminophen (NORCO/VICODIN) 5-325 MG per tablet Take 1 tablet by mouth every 6 (six) hours as needed for moderate pain. 10/06/14  Yes Reyne Dumas, MD  hydroxypropyl methylcellulose (ISOPTO TEARS) 2.5 % ophthalmic  solution Place 1 drop into both eyes 2 (two) times daily.    Yes Historical Provider, MD  isosorbide mononitrate (IMDUR) 60 MG 24 hr tablet Take 0.5 tablets (30 mg total) by mouth every morning. 01/07/14  Yes Shanker Kristeen Mans, MD  Multiple Vitamin (MULTIVITAMIN WITH MINERALS) TABS Take 1 tablet by mouth daily.   Yes Historical Provider, MD  nitroGLYCERIN (NITROSTAT) 0.4 MG SL tablet Place 0.4 mg under the tongue every 5 (five) minutes as needed for chest pain.    Yes Historical Provider, MD  ranitidine (ZANTAC) 150 MG tablet Take 150 mg by mouth as needed for heartburn.   Yes Historical Provider, MD  silodosin (RAPAFLO) 8 MG CAPS capsule Take 8 mg by mouth every evening.    Yes Historical Provider, MD  amoxicillin-clavulanate (AUGMENTIN) 875-125 MG per tablet Take 1 tablet by mouth every 12 (twelve) hours. Patient not taking: Reported on 12/04/2014 10/06/14   Reyne Dumas, MD   BP 127/50 mmHg  Pulse 72  Temp(Src) 98.6 F (37 C) (Oral)  Resp 27  SpO2 94%   Physical Exam  Constitutional: He is oriented to person, place, and time. No distress.  HENT:  Head: Normocephalic and atraumatic.  Eyes: Conjunctivae are normal. Pupils are equal, round, and reactive to light.  Neck: Normal range of motion. Neck supple.  Cardiovascular:  Normal rate, regular rhythm and intact distal pulses.   Pulmonary/Chest: Effort normal and breath sounds normal.  Abdominal: Soft. Bowel sounds are normal. He exhibits no distension. There is tenderness. There is no rebound and no guarding.  Musculoskeletal: Normal range of motion.  Neurological: He is alert and oriented to person, place, and time.  Skin: Skin is warm and dry. He is not diaphoretic.    ED Course  Procedures   Labs Review Labs Reviewed  URINALYSIS, ROUTINE W REFLEX MICROSCOPIC (NOT AT [facility]) - Abnormal; Notable for the following:    APPearance CLOUDY (*)    Hgb urine dipstick LARGE (*)    Protein, ur 100 (*)    Leukocytes, UA MODERATE (*)    All other components within normal limits  COMPREHENSIVE METABOLIC PANEL - Abnormal; Notable for the following:    Glucose, Bld 159 (*)    Total Protein 6.4 (*)    GFR calc non Af Amer 56 (*)    All other components within normal limits  CBC WITH DIFFERENTIAL/PLATELET - Abnormal; Notable for the following:    WBC 14.0 (*)    Platelets 91 (*)    Neutrophils Relative % 89 (*)    Neutro Abs 12.4 (*)    Lymphocytes Relative 5 (*)    All other components within normal limits  URINE MICROSCOPIC-ADD ON - Abnormal; Notable for the following:    Bacteria, UA FEW (*)    All other components within normal limits  GRAM STAIN  URINE CULTURE  CULTURE, BLOOD (ROUTINE X 2)  CULTURE, BLOOD (ROUTINE X 2)  LIPASE, BLOOD  I-STAT CG4 LACTIC ACID, ED  I-STAT CG4 LACTIC ACID, ED   Imaging Review Dg Chest 2 View  12/04/2014   CLINICAL DATA:  Weakness today.  Chronic headache.  EXAM: CHEST  2 VIEW  COMPARISON:  10/03/2014  FINDINGS: Shallow inspiration with elevation of left hemidiaphragm. Scarring and atelectasis in the left lung base is similar to prior study. Heart size and pulmonary vascularity are normal for technique. No focal consolidation in the lungs. No pneumothorax. Degenerative changes in the spine and shoulders. Postoperative changes  in the mediastinum.  IMPRESSION: Elevation of left hemidiaphragm with scarring in the left lung base. No focal consolidation in the lungs.   Electronically Signed   By: Lucienne Capers M.D.   On: 12/04/2014 23:46   I have personally reviewed and evaluated these images and lab results as part of my medical decision-making.   EKG Interpretation None      MDM  Mr. Kalil is a 81 yo male with extensive PMHx presenting with generalized weakness and urinary symptoms. Patient with known prostate cancer status post brachytherapy. Has been admitted to the hospital for 5 times over past year for UTIs and bacteremia. Most recent in July of this year. Worsening urinary frequency and urgency associated with suprapubic pain. Generalized weakness as well.  The above notable for elderly male lying in stretcher in no acute distress. Afebrile. Heart rate 60s. Lowest SBP in 90s but and low 100s predominantly. Mild suprapubic tenderness to palpation without guarding or rebound.  UA showing moderate leukocytes, negative nitrites, bacteria. Urine culture sent. CMP relatively unremarkable. WBC 14. Lactic acid 1.3. Blood culture sent. Review of previous urine cultures show sensitivity to cephalosporins. Patient given IV fluids and IV Rocephin in the ED.  Patient admitted to hospital service for further evaluation and management of generalized weakness in setting of UTI. Patient's family and patient understand and agree with the plan and had no further questions or concerns that time.  Patient care discussed with and followed by my attending, Dr. Varney Biles  Final diagnoses:  Urinary tract infection without hematuria, site unspecified    Mayer Camel, MD 12/05/14 [place], MD 12/08/14 [phone]

## 2014-12-04 NOTE — ED Notes (Signed)
Pt transported to xray 

## 2014-12-04 NOTE — ED Notes (Signed)
pts wife states that pt has been having chills and shaking. Pt also c/o weakness. Pt with hx of prostate cancer, cystitis, radiation seed  Implant.  Wife states that pt has been having these symptoms for several months and it is believed to be due to the seed implants.

## 2014-12-04 NOTE — ED Notes (Signed)
Pt wife reports pt has had weakness and inability to walk that is new. Pt brief noted to be soaked. Pt has hx of UTI and sepsis. Pt wife reports pt has c.o. Painful urination and groin pain. Pt has surgery scheduled but wife states patient has gotten worse.

## 2014-12-05 DIAGNOSIS — J101 Influenza due to other identified influenza virus with other respiratory manifestations: Secondary | ICD-10-CM | POA: Diagnosis present

## 2014-12-05 DIAGNOSIS — D649 Anemia, unspecified: Secondary | ICD-10-CM | POA: Diagnosis present

## 2014-12-05 DIAGNOSIS — Z8546 Personal history of malignant neoplasm of prostate: Secondary | ICD-10-CM | POA: Diagnosis not present

## 2014-12-05 DIAGNOSIS — Z951 Presence of aortocoronary bypass graft: Secondary | ICD-10-CM | POA: Diagnosis not present

## 2014-12-05 DIAGNOSIS — N359 Urethral stricture, unspecified: Secondary | ICD-10-CM | POA: Diagnosis present

## 2014-12-05 DIAGNOSIS — Z79899 Other long term (current) drug therapy: Secondary | ICD-10-CM | POA: Diagnosis not present

## 2014-12-05 DIAGNOSIS — A419 Sepsis, unspecified organism: Secondary | ICD-10-CM

## 2014-12-05 DIAGNOSIS — I255 Ischemic cardiomyopathy: Secondary | ICD-10-CM | POA: Diagnosis present

## 2014-12-05 DIAGNOSIS — Z7982 Long term (current) use of aspirin: Secondary | ICD-10-CM | POA: Diagnosis not present

## 2014-12-05 DIAGNOSIS — Z881 Allergy status to other antibiotic agents status: Secondary | ICD-10-CM | POA: Diagnosis not present

## 2014-12-05 DIAGNOSIS — N3000 Acute cystitis without hematuria: Secondary | ICD-10-CM

## 2014-12-05 DIAGNOSIS — Z923 Personal history of irradiation: Secondary | ICD-10-CM | POA: Diagnosis not present

## 2014-12-05 DIAGNOSIS — Z87891 Personal history of nicotine dependence: Secondary | ICD-10-CM | POA: Diagnosis not present

## 2014-12-05 DIAGNOSIS — A4152 Sepsis due to Pseudomonas: Secondary | ICD-10-CM | POA: Diagnosis present

## 2014-12-05 DIAGNOSIS — Z85820 Personal history of malignant melanoma of skin: Secondary | ICD-10-CM | POA: Diagnosis not present

## 2014-12-05 DIAGNOSIS — I2510 Atherosclerotic heart disease of native coronary artery without angina pectoris: Secondary | ICD-10-CM | POA: Diagnosis present

## 2014-12-05 DIAGNOSIS — Z9049 Acquired absence of other specified parts of digestive tract: Secondary | ICD-10-CM | POA: Diagnosis present

## 2014-12-05 DIAGNOSIS — Z85828 Personal history of other malignant neoplasm of skin: Secondary | ICD-10-CM | POA: Diagnosis not present

## 2014-12-05 DIAGNOSIS — I10 Essential (primary) hypertension: Secondary | ICD-10-CM

## 2014-12-05 DIAGNOSIS — B965 Pseudomonas (aeruginosa) (mallei) (pseudomallei) as the cause of diseases classified elsewhere: Secondary | ICD-10-CM | POA: Diagnosis present

## 2014-12-05 DIAGNOSIS — Z87442 Personal history of urinary calculi: Secondary | ICD-10-CM | POA: Diagnosis not present

## 2014-12-05 DIAGNOSIS — R509 Fever, unspecified: Secondary | ICD-10-CM | POA: Diagnosis not present

## 2014-12-05 DIAGNOSIS — Z87440 Personal history of urinary (tract) infections: Secondary | ICD-10-CM | POA: Diagnosis not present

## 2014-12-05 DIAGNOSIS — D696 Thrombocytopenia, unspecified: Secondary | ICD-10-CM | POA: Diagnosis present

## 2014-12-05 DIAGNOSIS — I5022 Chronic systolic (congestive) heart failure: Secondary | ICD-10-CM | POA: Diagnosis not present

## 2014-12-05 DIAGNOSIS — Z882 Allergy status to sulfonamides status: Secondary | ICD-10-CM | POA: Diagnosis not present

## 2014-12-05 DIAGNOSIS — I252 Old myocardial infarction: Secondary | ICD-10-CM | POA: Diagnosis not present

## 2014-12-05 DIAGNOSIS — Z23 Encounter for immunization: Secondary | ICD-10-CM | POA: Diagnosis not present

## 2014-12-05 DIAGNOSIS — N390 Urinary tract infection, site not specified: Secondary | ICD-10-CM | POA: Diagnosis present

## 2014-12-05 DIAGNOSIS — I959 Hypotension, unspecified: Secondary | ICD-10-CM | POA: Diagnosis present

## 2014-12-05 DIAGNOSIS — E785 Hyperlipidemia, unspecified: Secondary | ICD-10-CM | POA: Diagnosis present

## 2014-12-05 DIAGNOSIS — K219 Gastro-esophageal reflux disease without esophagitis: Secondary | ICD-10-CM | POA: Diagnosis present

## 2014-12-05 DIAGNOSIS — N3281 Overactive bladder: Secondary | ICD-10-CM | POA: Diagnosis present

## 2014-12-05 DIAGNOSIS — D72829 Elevated white blood cell count, unspecified: Secondary | ICD-10-CM | POA: Diagnosis present

## 2014-12-05 DIAGNOSIS — Z888 Allergy status to other drugs, medicaments and biological substances status: Secondary | ICD-10-CM | POA: Diagnosis not present

## 2014-12-05 DIAGNOSIS — M1990 Unspecified osteoarthritis, unspecified site: Secondary | ICD-10-CM | POA: Diagnosis present

## 2014-12-05 DIAGNOSIS — R001 Bradycardia, unspecified: Secondary | ICD-10-CM | POA: Diagnosis present

## 2014-12-05 LAB — BASIC METABOLIC PANEL
Anion gap: 7 (ref 5–15)
BUN: 19 mg/dL (ref 6–20)
CO2: 23 mmol/L (ref 22–32)
Calcium: 8.4 mg/dL — ABNORMAL LOW (ref 8.9–10.3)
Chloride: 107 mmol/L (ref 101–111)
Creatinine, Ser: 1.05 mg/dL (ref 0.61–1.24)
GFR calc Af Amer: >60 mL/min (ref >60)
GFR calc non Af Amer: >60 mL/min (ref >60)
Glucose, Bld: 146 mg/dL — ABNORMAL HIGH (ref 65–99)
Potassium: 3.5 mmol/L (ref 3.5–5.1)
Sodium: 137 mmol/L (ref 135–145)

## 2014-12-05 LAB — URINE MICROSCOPIC-ADD ON

## 2014-12-05 LAB — URINALYSIS, ROUTINE W REFLEX MICROSCOPIC
Bilirubin Urine: NEGATIVE
Glucose, UA: NEGATIVE mg/dL
Ketones, ur: NEGATIVE mg/dL
Nitrite: NEGATIVE
Protein, ur: 100 mg/dL — AB
Specific Gravity, Urine: 1.015 (ref 1.005–1.030)
Urobilinogen, UA: 0.2 mg/dL (ref 0.0–1.0)
pH: 5.5 (ref 5.0–8.0)

## 2014-12-05 LAB — CBC
HCT: 38.8 % — ABNORMAL LOW (ref 39.0–52.0)
Hemoglobin: 12.8 g/dL — ABNORMAL LOW (ref 13.0–17.0)
MCH: 30.4 pg (ref 26.0–34.0)
MCHC: 33 g/dL (ref 30.0–36.0)
MCV: 92.2 fL (ref 78.0–100.0)
Platelets: 90 10*3/uL — ABNORMAL LOW (ref 150–400)
RBC: 4.21 MIL/uL — ABNORMAL LOW (ref 4.22–5.81)
RDW: 14.3 % (ref 11.5–15.5)
WBC: 15.1 10*3/uL — ABNORMAL HIGH (ref 4.0–10.5)

## 2014-12-05 LAB — GRAM STAIN: Special Requests: NORMAL

## 2014-12-05 LAB — LACTIC ACID, PLASMA: Lactic Acid, Venous: 2.1 mmol/L (ref 0.5–2.0)

## 2014-12-05 LAB — I-STAT CG4 LACTIC ACID, ED: Lactic Acid, Venous: 2.51 mmol/L (ref 0.5–2.0)

## 2014-12-05 MED ORDER — DEXTROSE 5 % IV SOLN
1.0000 g | INTRAVENOUS | Status: DC
  Administered 2014-12-05 – 2014-12-07 (×3): 1 g via INTRAVENOUS
  Filled 2014-12-05 (×4): qty 1

## 2014-12-05 MED ORDER — SODIUM CHLORIDE 0.9 % IV SOLN
INTRAVENOUS | Status: DC
  Administered 2014-12-05 – 2014-12-06 (×4): via INTRAVENOUS

## 2014-12-05 MED ORDER — GALANTAMINE HYDROBROMIDE ER 8 MG PO CP24
8.0000 mg | ORAL_CAPSULE | Freq: Every day | ORAL | Status: DC
  Administered 2014-12-05 – 2014-12-07 (×3): 8 mg via ORAL
  Filled 2014-12-05 (×4): qty 1

## 2014-12-05 MED ORDER — FLAVOXATE HCL 100 MG PO TABS
100.0000 mg | ORAL_TABLET | Freq: Three times a day (TID) | ORAL | Status: DC | PRN
  Administered 2014-12-05: 100 mg via ORAL
  Filled 2014-12-05 (×3): qty 1

## 2014-12-05 MED ORDER — ISOSORBIDE MONONITRATE ER 30 MG PO TB24
30.0000 mg | ORAL_TABLET | Freq: Every morning | ORAL | Status: DC
  Administered 2014-12-05 – 2014-12-07 (×3): 30 mg via ORAL
  Filled 2014-12-05 (×3): qty 1

## 2014-12-05 MED ORDER — ACETAMINOPHEN 325 MG PO TABS
650.0000 mg | ORAL_TABLET | Freq: Once | ORAL | Status: AC
  Administered 2014-12-05: 650 mg via ORAL
  Filled 2014-12-05: qty 2

## 2014-12-05 MED ORDER — ATORVASTATIN CALCIUM 40 MG PO TABS
40.0000 mg | ORAL_TABLET | Freq: Every evening | ORAL | Status: DC
  Administered 2014-12-05 – 2014-12-06 (×2): 40 mg via ORAL
  Filled 2014-12-05 (×2): qty 1

## 2014-12-05 MED ORDER — DEXTROSE 5 % IV SOLN: 1.0000 g | INTRAVENOUS | Status: DC

## 2014-12-05 MED ORDER — ASPIRIN EC 81 MG PO TBEC
81.0000 mg | DELAYED_RELEASE_TABLET | Freq: Every day | ORAL | Status: DC
  Administered 2014-12-05 – 2014-12-07 (×3): 81 mg via ORAL
  Filled 2014-12-05 (×3): qty 1

## 2014-12-05 MED ORDER — ENOXAPARIN SODIUM 40 MG/0.4ML ~~LOC~~ SOLN
40.0000 mg | SUBCUTANEOUS | Status: DC
  Administered 2014-12-05 – 2014-12-07 (×3): 40 mg via SUBCUTANEOUS
  Filled 2014-12-05 (×3): qty 0.4

## 2014-12-05 MED ORDER — ACETAMINOPHEN 325 MG PO TABS
650.0000 mg | ORAL_TABLET | Freq: Four times a day (QID) | ORAL | Status: DC | PRN
  Administered 2014-12-05: 650 mg via ORAL
  Filled 2014-12-05: qty 2

## 2014-12-05 MED ORDER — INFLUENZA VAC SPLIT QUAD 0.5 ML IM SUSY
0.5000 mL | PREFILLED_SYRINGE | INTRAMUSCULAR | Status: AC
  Administered 2014-12-07: 0.5 mL via INTRAMUSCULAR
  Filled 2014-12-05: qty 0.5

## 2014-12-05 MED ORDER — HYPROMELLOSE (GONIOSCOPIC) 2.5 % OP SOLN
1.0000 [drp] | Freq: Two times a day (BID) | OPHTHALMIC | Status: DC
  Administered 2014-12-05 – 2014-12-06 (×4): 1 [drp] via OPHTHALMIC
  Filled 2014-12-05: qty 15

## 2014-12-05 MED ORDER — TAMSULOSIN HCL 0.4 MG PO CAPS
0.4000 mg | ORAL_CAPSULE | Freq: Every day | ORAL | Status: DC
  Administered 2014-12-05 – 2014-12-07 (×3): 0.4 mg via ORAL
  Filled 2014-12-05 (×3): qty 1

## 2014-12-05 MED ORDER — HYDROCODONE-ACETAMINOPHEN 5-325 MG PO TABS
1.0000 | ORAL_TABLET | Freq: Four times a day (QID) | ORAL | Status: DC | PRN
Start: 2014-12-05 — End: 2014-12-07
  Administered 2014-12-05 – 2014-12-07 (×5): 1 via ORAL
  Filled 2014-12-05 (×5): qty 1

## 2014-12-05 NOTE — Evaluation (Signed)
Physical Therapy Evaluation Patient Details Name: Ronald [name] MRN: [id number] DOB: 1927/11/22 Today's Date: 12/05/2014   History of Present Illness  Patient is a 81 y/o male presents with dysuria, weakness, chills/fever and found to have UTI. PMH includes CAD, CABG, ICM with EF 35-40%, mild dementia, HTN, MI, CHF.  Clinical Impression  Patient presents with generalized weakness and balance deficits impacting safe mobility. PTA, pt independent and working on house, driving etc. Pt impulsive (most likely due to urgency to get to bathroom) with poor safety awareness today requiring min A for support. May need RW for support at home pending improvement while in hospital. Will need to negotiate 3 steps prior to discharge. Will follow acutely to maximize independence and mobility prior to return home.     Follow Up Recommendations Home health PT;Supervision/Assistance - 24 hour    Equipment Recommendations  Other (comment) (TBD pending progress, mayneed RW.)    Recommendations for Other Services       Precautions / Restrictions Precautions Precautions: Fall Restrictions Weight Bearing Restrictions: No      Mobility  Bed Mobility Overal bed mobility: Needs Assistance Bed Mobility: Supine to Sit     Supine to sit: Supervision;HOB elevated     General bed mobility comments: Supervision for safety. Use of rails.  Transfers Overall transfer level: Needs assistance Equipment used: Rolling walker (2 wheeled) Transfers: Sit to/from Stand Sit to Stand: Min guard         General transfer comment: Min guard for safety. Stood from [employer], from toilet x1. Transferred to chair post ambulation bout.  Ambulation/Gait Ambulation/Gait assistance: Min assist Ambulation Distance (Feet): 20 Feet (x2 bouts) Assistive device: Rolling walker (2 wheeled);None Gait Pattern/deviations: Step-through pattern;Trunk flexed;Shuffle     General Gait Details: Pt urgently trying to get to bathroom-  unsteady and pushing RW to the side with uncontrolled descent onto toilet. Min A for balance/safety.   Stairs            Wheelchair Mobility    Modified Rankin (Stroke Patients Only)       Balance Overall balance assessment: Needs assistance Sitting-balance support: Feet supported;No upper extremity supported Sitting balance-Leahy Scale: Good Sitting balance - Comments: Able to assist donning 1 sock reaching outside BoS but requires assist with other sock   Standing balance support: During functional activity Standing balance-Leahy Scale: Fair Standing balance comment: Tolerated washing hands at sink with UE support. Pt incontinent of urine at sink when trying to make it back to toilet. Cleaned up urine and patient.                             Pertinent Vitals/Pain Pain Assessment: No/denies pain    Home Living Family/patient expects to be discharged to:: Private residence Living Arrangements: Spouse/significant other Available Help at Discharge: Family;Available 24 hours/day Type of Home: House Home Access: Stairs to enter Entrance Stairs-Rails: Psychiatric nurse of Steps: 3 Home Layout: One level Home Equipment: Cane - single point Additional Comments: Patient states he likes doing yard work - "keeps me moving"    Prior Function Level of Independence: Independent         Comments: Drives. Uses cane for uneven terrain or hills.     Hand Dominance        Extremity/Trunk Assessment   Upper Extremity Assessment: Generalized weakness;Defer to OT evaluation           Lower Extremity Assessment: Generalized weakness  Communication   Communication: HOH  Cognition Arousal/Alertness: Awake/alert Behavior During Therapy: Impulsive Overall Cognitive Status:  (with hx of mild dementia.)                      General Comments General comments (skin integrity, edema, etc.): Wife present. Pt premorbidly  incontinent of urine at home and wears depends.     Exercises        Assessment/Plan    PT Assessment Patient needs continued PT services  PT Diagnosis Difficulty walking;Generalized weakness   PT Problem List Decreased strength;Decreased activity tolerance;Decreased cognition;Decreased balance;Decreased mobility;Decreased safety awareness;Decreased knowledge of use of DME  PT Treatment Interventions Balance training;Gait training;Functional mobility training;Therapeutic activities;Therapeutic exercise;Patient/family education;Stair training;DME instruction   PT Goals (Current goals can be found in the Care Plan section) Acute Rehab PT Goals Patient Stated Goal: to get up and walk PT Goal Formulation: With patient Time For Goal Achievement: 12/19/14 Potential to Achieve Goals: Fair    Frequency Min 3X/week   Barriers to discharge        Co-evaluation               End of Session Equipment Utilized During Treatment: Gait belt Activity Tolerance: Patient tolerated treatment well Patient left: in chair;with call bell/phone within reach;with family/visitor present Nurse Communication: Mobility status         Time: [id number] PT Time Calculation (min) (ACUTE ONLY): 29 min   Charges:   PT Evaluation $Initial PT Evaluation Tier I: 1 Procedure PT Treatments $Therapeutic Activity: 8-22 mins   PT G Codes:        Meghin Thivierge A [place] Cantero 12/05/2014, 3:38 PM Wray Kearns, [place], DPT [phone]

## 2014-12-05 NOTE — ED Notes (Signed)
Entered room and pt starting shivering again. Temp rechecked.

## 2014-12-05 NOTE — Progress Notes (Signed)
ANTIBIOTIC CONSULT NOTE - INITIAL  Pharmacy Consult for Ceftazidime Indication: recent E. coli AND PSA UTI  Allergies  Allergen Reactions  . Pantoprazole Other (See Comments)    Headache and lightheaded  . Nitrofurantoin Hives  . Sulfa Antibiotics Hives and Itching    Patient Measurements: Height: 5\' 4"  (162.6 cm) Weight: 157 lb 13.6 oz (71.6 kg) IBW/kg (Calculated) : 59.2  Vital Signs: Temp: 99.5 F (37.5 C) (09/09 0657) Temp Source: Oral (09/09 0657) BP: 130/65 mmHg (09/09 0657) Pulse Rate: 72 (09/09 0657) Intake/Output from previous day: 09/08 0701 - 09/09 0700 In: 1050 [I.V.:1050] Out: 20 [Urine:20] Intake/Output from this shift:    Labs:  Recent Labs  12/04/14 2301 12/05/14 0454  WBC 14.0* 15.1*  HGB 13.8 12.8*  PLT 91* 90*  CREATININE 1.16 1.05   Estimated Creatinine Clearance: 46.7 mL/min (by C-G formula based on Cr of 1.05). No results for input(s): VANCOTROUGH, VANCOPEAK, VANCORANDOM, GENTTROUGH, GENTPEAK, GENTRANDOM, TOBRATROUGH, TOBRAPEAK, TOBRARND, AMIKACINPEAK, AMIKACINTROU, AMIKACIN in the last 72 hours.   Microbiology: Recent Results (from the past 720 hour(s))  Gram stain     Status: None   Collection Time: 12/04/14 11:44 PM  Result Value Ref Range Status   Specimen Description URINE, CLEAN CATCH  Final   Special Requests Normal  Final   Gram Stain   Final    CYTOSPIN SMEAR WBC PRESENT,BOTH PMN AND MONONUCLEAR GRAM NEGATIVE RODS IN DIPLOS IN PAIRS CRITICAL RESULT CALLED TO, READ BACK BY AND VERIFIED WITH: H REINGLY @0041  12/05/14 MKELLY    Report Status 12/05/2014 FINAL  Final    Medical History: Past Medical History  Diagnosis Date  . Hyperlipidemia   . S/P CABG (coronary artery bypass graft)     [place]  . History of Bell's palsy     RIGHT SIDE-- NO RESIDUAL  . Gross hematuria   . Urethral stricture   . OAB (overactive bladder)   . Chronic cystitis   . Ischemic cardiomyopathy     ef 35-40% per cath 08-28-2013   . Coronary artery disease CARDIOLOGIST-  DR Grace Bushy  MI -- S/P  11/89 CABG x6 (70% circ; 90% PD; 60-70% distal left main; 30% left circ; 90% 1st diag;, 2nd diag and 3rd diag. w/90%,  mild stenosis LAD and 60-70% pLAD)  Re-do CABG x5 in 1997  . History of nonmelanoma skin cancer     excision's from scalp  . Wears hearing aid     bilateral  . Wears partial dentures   . At risk for sleep apnea     STOP-BANG= 4    SENT TO PCP 12-23-2013  . GERD (gastroesophageal reflux disease)   . Mild dementia   . Kidney stones "years ago"    "passed them"  . Hypertension     "not anymore" (01/02/2014)  . Myocardial infarction 1986; 1997  . Arthritis     "joints ache" (01/02/2014)  . Prostate cancer 1998    S/P  RADIACTIVE SEED IMPLANTS; UROLOGIST-  DR GRAPEY  . Melanoma of ear     "right"    Medications:  Prescriptions prior to admission  Medication Sig Dispense Refill Last Dose  . acetaminophen (TYLENOL) 500 MG tablet Take 500 mg by mouth every 6 (six) hours as needed for pain or fever.    12/04/2014 at Unknown time  . aspirin EC 81 MG EC tablet Take 1 tablet (81 mg total) by mouth daily. 30 tablet 0 12/04/2014  at Unknown time  . atorvastatin (LIPITOR) 80 MG tablet Take 40 mg by mouth every evening.   12/03/2014 at Unknown time  . calcium carbonate (TUMS - DOSED IN MG ELEMENTAL CALCIUM) 500 MG chewable tablet Chew 2 tablets by mouth as needed for indigestion or heartburn.   12/04/2014 at Unknown time  . Calcium Carbonate-Vitamin D (CALCIUM + D PO) Take 1 tablet by mouth 2 (two) times daily.    12/04/2014 at Unknown time  . Cholecalciferol (VITAMIN D) 2000 UNITS tablet Take 2,000 Units by mouth daily.   12/04/2014 at Unknown time  . flavoxATE (URISPAS) 100 MG tablet Take 1 tablet (100 mg total) by mouth 3 (three) times daily as needed for bladder spasms. 60 tablet 3 12/04/2014 at Unknown time  . galantamine (RAZADYNE ER) 8 MG 24 hr capsule Take 8 mg by mouth daily with breakfast.   12/04/2014 at Unknown time   . HYDROcodone-acetaminophen (NORCO/VICODIN) 5-325 MG per tablet Take 1 tablet by mouth every 6 (six) hours as needed for moderate pain. 30 tablet 0 12/04/2014 at Unknown time  . hydroxypropyl methylcellulose (ISOPTO TEARS) 2.5 % ophthalmic solution Place 1 drop into both eyes 2 (two) times daily.    12/04/2014 at Unknown time  . isosorbide mononitrate (IMDUR) 60 MG 24 hr tablet Take 0.5 tablets (30 mg total) by mouth every morning.   12/04/2014 at Unknown time  . Multiple Vitamin (MULTIVITAMIN WITH MINERALS) TABS Take 1 tablet by mouth daily.   12/04/2014 at Unknown time  . nitroGLYCERIN (NITROSTAT) 0.4 MG SL tablet Place 0.4 mg under the tongue every 5 (five) minutes as needed for chest pain.    unknown  . ranitidine (ZANTAC) 150 MG tablet Take 150 mg by mouth as needed for heartburn.   12/04/2014 at Unknown time  . silodosin (RAPAFLO) 8 MG CAPS capsule Take 8 mg by mouth every evening.    12/03/2014 at Unknown time  . [DISCONTINUED] amoxicillin-clavulanate (AUGMENTIN) 875-125 MG per tablet Take 1 tablet by mouth every 12 (twelve) hours. (Patient not taking: Reported on 12/04/2014) 20 tablet 0 Completed Course at Unknown time   Assessment: 81 y.o. male presents with dysuria. Noted pt with h/o recurrent UTIs over past several months - last culture 7/8 growing Ecoli and pseudomonas. WBC elevated to 15.1. Tm 101.2. Tc 99.5. Rocephin 1gm given in ED ~0030. To broaden coverage to Ceftazidime. SCr 1.05, est CrCl 45 ml/min.  Goal of Therapy:  Resolution of infection  Plan:  Ceftazidime 1gm IV q24 Will f/u micro data, pt's clinical condition, and renal function  Sherlon Handing, PharmD, BCPS Clinical pharmacist, pager [phone] 12/05/2014,7:04 AM

## 2014-12-05 NOTE — Progress Notes (Signed)
CRITICAL VALUE ALERT  Critical value received:  Lactic acid 2.1  Date of notification:  12/05/2014   Time of notification:  4:18 PM  Critical value read back:Yes.    Nurse who received alert:  Ethelda Chick  MD notified (1st page):  Dr. Sheran Fava  Time of first page:  4:40 pm  Responding MD:  Dr. Sheran Fava  Time MD responded:  4:43 pm

## 2014-12-05 NOTE — ED Notes (Signed)
Dr. Alcario Drought at the bedside. Gram stain results given to him. Gram negative rods with Gram positive cocci in pairs.

## 2014-12-05 NOTE — Progress Notes (Addendum)
[facility] PROGRESS NOTE  Ronald [name] [id number] DOB: 08-17-1927 DOA: 12/04/2014 PCP: Ronald Cowden, MD  Brief Summary  Ronald [name] is a 81 y.o. male with extensive history of recurrent UTIs over past several months, always with an E.coli and more recently with E. Coli and Pseudomonas.  UTIs are occuring in setting of damage to urethera following history of radiotherapy some years ago for prostate cancer (was successful in clearing the tumor).  He presented with usual UTI symptoms of dysuria, feeling of urinary retention, generalized weakness, fever, chills.  He had evidence of UTI on his UA and was febrilei at admission.    Assessment/Plan  Sepsis secondary to urinary tract infection, fever resolving, leukocytosis worsening slightly -  Continue IV fluids -  Discontinue ceftriaxone and start ceftazidime to cover pseudomonas also -  Follow-up urine culture -  Recommend close follow-up with urology, Dr. Risa Grill -  Continue Flomax  Essential hypertension -  Blood pressure stable, not on blood pressure medication  Chronic systolic congestive heart failure with ejection fraction of 35-40% -  Patient is not on ACE inhibitor, ARB, beta blocker -  Defer to primary care doctor and cardiologist  CAD status post CABG in 19896 vessels in 19985 vessels -  Continue high-dose statin -  Patient is not on beta blocker, defer to cardiology -  Continue Imdur -  Continue aspirin  Leukocytosis, likely secondary to sepsis -  Repeat CBC in a.m.  Normocytic anemia, hemoglobin at baseline -  Defer to primary care doctor  Chronic thrombocytopenia, platelets higher than normal at 90,000 -  Monitor for bleeding  Diet:  Healthy heart Access:  PIV IVF:  yes Proph:  Lovenox  Code Status: Full code Family Communication: Patient alone Disposition Plan: Pending improvement in sepsis. Anticipate PT/OT assessments prior to  discharge   Consultants:  None  Procedures:  None  Antibiotics:  Ceftazidime 9/9   HPI/Subjective:  Patient states that he continues to have very low energy, difficulty urinating, pain in the suprapubic area  Objective: Filed Vitals:   12/05/14 0030 12/05/14 0121 12/05/14 0142 12/05/14 0657  BP: 127/50 157/68 156/70 130/65  Pulse: 72 81 81 72  Temp:  100 F (37.8 C) 101.2 F (38.4 C) 99.5 F (37.5 C)  TempSrc:  Oral Oral Oral  Resp: 27 20 24 18   Height:   5\' 4"  (1.626 m)   Weight:   71.6 kg (157 lb 13.6 oz)   SpO2: 94% 96% 96% 98%    Intake/Output Summary (Last 24 hours) at 12/05/14 1333 Last data filed at 12/05/14 0900  Gross per 24 hour  Intake   1300 ml  Output     20 ml  Net   1280 ml   Filed Weights   12/05/14 0142  Weight: 71.6 kg (157 lb 13.6 oz)   Body mass index is 27.08 kg/(m^2).  Exam:   General:  Adult male, No acute distress, hard of hearing  HEENT:  NCAT, MMM  Cardiovascular:  RRR, nl S1, S2 no mrg, 2+ pulses, warm extremities  Respiratory:  CTAB, no increased WOB  Abdomen:   NABS, soft, mildly distended, tender to palpation in the suprapubic area without rebound or guarding  MSK:   Normal tone and bulk, no LEE  Neuro:  Grossly intact  Data Reviewed: Basic Metabolic Panel:  Recent Labs Lab 12/04/14 2301 12/05/14 0454  NA 137 137  K 3.9 3.5  CL 104 107  CO2 23 23  GLUCOSE 159* 146*  BUN 20 19  CREATININE 1.16 1.05  CALCIUM 9.0 8.4*   Liver Function Tests:  Recent Labs Lab 12/04/14 2301  AST 27  ALT 20  ALKPHOS 55  BILITOT 1.1  PROT 6.4*  ALBUMIN 3.8    Recent Labs Lab 12/04/14 2301  LIPASE 31   No results for input(s): AMMONIA in the last 168 hours. CBC:  Recent Labs Lab 12/04/14 2301 12/05/14 0454  WBC 14.0* 15.1*  NEUTROABS 12.4*  --   HGB 13.8 12.8*  HCT 41.3 38.8*  MCV 91.6 92.2  PLT 91* 90*    Recent Results (from the past 240 hour(s))  Blood culture (routine x 2)     Status: None  (Preliminary result)   Collection Time: 12/04/14 10:36 PM  Result Value Ref Range Status   Specimen Description BLOOD RIGHT ARM  Final   Special Requests BOTTLES DRAWN AEROBIC AND ANAEROBIC 10ML  Final   Culture NO GROWTH < 24 HOURS  Final   Report Status PENDING  Incomplete  Blood culture (routine x 2)     Status: None (Preliminary result)   Collection Time: 12/04/14 10:44 PM  Result Value Ref Range Status   Specimen Description BLOOD RIGHT FOREARM  Final   Special Requests BOTTLES DRAWN AEROBIC AND ANAEROBIC 5ML  Final   Culture NO GROWTH < 24 HOURS  Final   Report Status PENDING  Incomplete  Gram stain     Status: None   Collection Time: 12/04/14 11:44 PM  Result Value Ref Range Status   Specimen Description URINE, CLEAN CATCH  Final   Special Requests Normal  Final   Gram Stain   Final    CYTOSPIN SMEAR WBC PRESENT,BOTH PMN AND MONONUCLEAR GRAM NEGATIVE RODS GRAM POSITIVE COCCI IN DIPLOS IN PAIRS CORRECTED ON 09/09 AT 0701: PREVIOUSLY REPORTED AS IN DIPLOS IN PAIRS CRITICAL RESULT CALLED TO, READ BACK BY AND VERIFIED WITH: H REINGLY @0041  12/05/14 MKELLY    Report Status 12/05/2014 FINAL  Final  Urine culture     Status: None (Preliminary result)   Collection Time: 12/04/14 11:44 PM  Result Value Ref Range Status   Specimen Description URINE, CLEAN CATCH  Final   Special Requests Normal  Final   Culture CULTURE REINCUBATED FOR BETTER GROWTH  Final   Report Status PENDING  Incomplete     Studies: Dg Chest 2 View  12/04/2014   CLINICAL DATA:  Weakness today.  Chronic headache.  EXAM: CHEST  2 VIEW  COMPARISON:  10/03/2014  FINDINGS: Shallow inspiration with elevation of left hemidiaphragm. Scarring and atelectasis in the left lung base is similar to prior study. Heart size and pulmonary vascularity are normal for technique. No focal consolidation in the lungs. No pneumothorax. Degenerative changes in the spine and shoulders. Postoperative changes in the mediastinum.   IMPRESSION: Elevation of left hemidiaphragm with scarring in the left lung base. No focal consolidation in the lungs.   Electronically Signed   By: Lucienne Capers M.D.   On: 12/04/2014 23:46    Scheduled Meds: . aspirin EC  81 mg Oral Daily  . atorvastatin  40 mg Oral QPM  . cefTAZidime (FORTAZ)  IV  1 g Intravenous Q24H  . enoxaparin (LOVENOX) injection  40 mg Subcutaneous Q24H  . galantamine  8 mg Oral Q breakfast  . hydroxypropyl methylcellulose / hypromellose  1 drop Both Eyes BID  . [START ON 12/06/2014] Influenza vac split quadrivalent PF  0.5 mL Intramuscular Tomorrow-1000  . isosorbide mononitrate  30 mg  Oral q morning - 10a  . tamsulosin  0.4 mg Oral QPC breakfast   Continuous Infusions: . sodium chloride 125 mL/hr at 12/05/14 1248    Principal Problem:   UTI (urinary tract infection) Active Problems:   HTN (hypertension)   Chronic systolic CHF (congestive heart failure)   Fever   Sepsis    Time spent: 30 min    Sarika Baldini, [facility] Pager [phone]. If 7PM-7AM, please contact night-coverage at [email or web address], password [facility] 12/05/2014, 1:33 PM  LOS: 0 days

## 2014-12-05 NOTE — Care Management (Signed)
Utilization review completed. Neko Boyajian, RN Case Manager [phone]. 

## 2014-12-05 NOTE — H&P (Signed)
[facility] History and Physical  Ronald [name] [id number] DOB: Jan 28, 1928 DOA: 12/04/2014  Referring physician: EDP PCP: Nyoka Cowden, MD   Chief Complaint: Dysuria   HPI: Ronald [name] is a 81 y.o. male with extensive history of recurrent UTIs over past several months, always with an E.coli.  Has also had at least one episode of e.coli bacteremia in July during admit.  UTIs are occuring in setting of damage to urethera following history of radiotherapy some years ago for prostate cancer (was successful in clearing the tumor).  He presents with usual UTI symptoms of dysuria, feeling of urinary retention, generalized weakness, fever, chills.  Work up in ED does indeed demonstrate likely UTI.  Review of Systems: Systems reviewed.  As above, otherwise negative  Past Medical History  Diagnosis Date  . Hyperlipidemia   . S/P CABG (coronary artery bypass graft)     [place]  . History of Bell's palsy     RIGHT SIDE-- NO RESIDUAL  . Gross hematuria   . Urethral stricture   . OAB (overactive bladder)   . Chronic cystitis   . Ischemic cardiomyopathy     ef 35-40% per cath 08-28-2013  . Coronary artery disease CARDIOLOGIST-  DR Grace Bushy  MI -- S/P  11/89 CABG x6 (70% circ; 90% PD; 60-70% distal left main; 30% left circ; 90% 1st diag;, 2nd diag and 3rd diag. w/90%,  mild stenosis LAD and 60-70% pLAD)  Re-do CABG x5 in 1997  . History of nonmelanoma skin cancer     excision's from scalp  . Wears hearing aid     bilateral  . Wears partial dentures   . At risk for sleep apnea     STOP-BANG= 4    SENT TO PCP 12-23-2013  . GERD (gastroesophageal reflux disease)   . Mild dementia   . Kidney stones "years ago"    "passed them"  . Hypertension     "not anymore" (01/02/2014)  . Myocardial infarction 1986; 1997  . Arthritis     "joints ache" (01/02/2014)  . Prostate cancer 1998    S/P  RADIACTIVE SEED IMPLANTS; UROLOGIST-  DR GRAPEY  . Melanoma of  ear     "right"   Past Surgical History  Procedure Laterality Date  . Eus  10/05/2011    Procedure: ESOPHAGEAL ENDOSCOPIC ULTRASOUND (EUS) RADIAL;  Surgeon: Arta Silence, MD;  Location: [facility];  Service: Endoscopy;  Laterality: N/A;  . Tonsillectomy and adenoidectomy  1954  . Laparoscopic cholecystectomy  12-23-2005  . Radioactive prostate seed implants  1998  . Coronary artery bypass graft  11/ 1989  &  10/ 1997    1989-- 6 vessel/  1997 Re-do 5 vessel  . Cardiovascular stress test  08-01-2011  dr Angelena Form    inferior scar and possible soft tissue attenuation with minimal peri-infarct ischemia, small region of anterior ischemia and scar/  LVEF 53% LV wall motion with inferior hypokinesis/ no significant change from scan july 2011  . Cataract extraction w/ intraocular lens  implant, bilateral Bilateral 2007  . Cystoscopy with urethral dilatation N/A 12/25/2013    Procedure: CYSTOSCOPY WITH URETHRAL DILATATION, WITH BIOPSY;  Surgeon: Bernestine Amass, MD;  Location: [facility];  Service: Urology;  Laterality: N/A;  . Melanoma excision  X 1    "ear"  . Cardiac catheterization  02-22-2005  dr Vidal Schwalbe    mild to moderate lv dysfunction with inferobasilar  akinesis/  totally occluded SVG to Intermediate Diagonal and SVG to PDA and RCA branches, totally occluded native coronary circulation with diffuse disease pLAD diagonal system with potentially could be ischemic/  patent SVG to OM with collaterals to dRCA and patent LIMA to LAD and diagonal systemss  . Cardiac catheterization  08-28-2013  DR Daneen Schick    widely patent sequential left internal graft to the diagonal/LAD, widely patent SVG to OM with proximal 50% narrowing noted in the graft, total occlusion SVG's to RCA, RI, and the Diagonal/ LV dysfunction with inferobasal aneurysm and mid anterior wall region of akinesis/  overall EF 35-40%/  Total occlusion of the navtive circulation/  no significant change compared to 2006  cath  . Skin cancer excision  X 2    "top of head"  . Left heart catheterization with coronary/graft angiogram N/A 08/28/2013    Procedure: LEFT HEART CATHETERIZATION WITH Beatrix Fetters;  Surgeon: Sinclair Grooms, MD;  Location: [facility] CATH LAB;  Service: Cardiovascular;  Laterality: N/A;   Social History:  reports that he quit smoking about 30 years ago. His smoking use included Cigars. He quit smokeless tobacco use about 15 months ago. His smokeless tobacco use included Chew. He reports that he does not drink alcohol or use illicit drugs.  Allergies  Allergen Reactions  . Pantoprazole Other (See Comments)    Headache and lightheaded  . Nitrofurantoin Hives  . Sulfa Antibiotics Hives and Itching    Family History  Problem Relation Age of Onset  . Heart disease Mother   . Diabetes Father   . Heart disease Brother      Prior to Admission medications   Medication Sig Start Date End Date Taking? Authorizing Provider  acetaminophen (TYLENOL) 500 MG tablet Take 500 mg by mouth every 6 (six) hours as needed for pain or fever.    Yes Historical Provider, MD  aspirin EC 81 MG EC tablet Take 1 tablet (81 mg total) by mouth daily. 08/29/13  Yes Brittainy Erie Noe, PA-C  atorvastatin (LIPITOR) 80 MG tablet Take 40 mg by mouth every evening. 09/23/13  Yes Imogene Burn, PA-C  calcium carbonate (TUMS - DOSED IN MG ELEMENTAL CALCIUM) 500 MG chewable tablet Chew 2 tablets by mouth as needed for indigestion or heartburn.   Yes Historical Provider, MD  Calcium Carbonate-Vitamin D (CALCIUM + D PO) Take 1 tablet by mouth 2 (two) times daily.    Yes Historical Provider, MD  Cholecalciferol (VITAMIN D) 2000 UNITS tablet Take 2,000 Units by mouth daily.   Yes Historical Provider, MD  flavoxATE (URISPAS) 100 MG tablet Take 1 tablet (100 mg total) by mouth 3 (three) times daily as needed for bladder spasms. 09/12/14  Yes Ripudeep Krystal Eaton, MD  galantamine (RAZADYNE ER) 8 MG 24 hr capsule Take 8 mg by  mouth daily with breakfast.   Yes Historical Provider, MD  HYDROcodone-acetaminophen (NORCO/VICODIN) 5-325 MG per tablet Take 1 tablet by mouth every 6 (six) hours as needed for moderate pain. 10/06/14  Yes Reyne Dumas, MD  hydroxypropyl methylcellulose (ISOPTO TEARS) 2.5 % ophthalmic solution Place 1 drop into both eyes 2 (two) times daily.    Yes Historical Provider, MD  isosorbide mononitrate (IMDUR) 60 MG 24 hr tablet Take 0.5 tablets (30 mg total) by mouth every morning. 01/07/14  Yes Shanker Kristeen Mans, MD  Multiple Vitamin (MULTIVITAMIN WITH MINERALS) TABS Take 1 tablet by mouth daily.   Yes Historical Provider, MD  nitroGLYCERIN (NITROSTAT) 0.4 MG SL  tablet Place 0.4 mg under the tongue every 5 (five) minutes as needed for chest pain.    Yes Historical Provider, MD  ranitidine (ZANTAC) 150 MG tablet Take 150 mg by mouth as needed for heartburn.   Yes Historical Provider, MD  silodosin (RAPAFLO) 8 MG CAPS capsule Take 8 mg by mouth every evening.    Yes Historical Provider, MD  amoxicillin-clavulanate (AUGMENTIN) 875-125 MG per tablet Take 1 tablet by mouth every 12 (twelve) hours. Patient not taking: Reported on 12/04/2014 10/06/14   Reyne Dumas, MD   Physical Exam: Filed Vitals:   12/05/14 0142  BP: 156/70  Pulse: 81  Temp: 101.2 F (38.4 C)  Resp: 24    BP 156/70 mmHg  Pulse 81  Temp(Src) 101.2 F (38.4 C) (Oral)  Resp 24  Ht 5\' 4"  (1.626 m)  Wt 71.6 kg (157 lb 13.6 oz)  BMI 27.08 kg/m2  SpO2 96%  General Appearance:    Alert, oriented, no distress, appears stated age  Head:    Normocephalic, atraumatic  Eyes:    PERRL, EOMI, sclera non-icteric        Nose:   Nares without drainage or epistaxis. Mucosa, turbinates normal  Throat:   Moist mucous membranes. Oropharynx without erythema or exudate.  Neck:   Supple. No carotid bruits.  No thyromegaly.  No lymphadenopathy.   Back:     No CVA tenderness, no spinal tenderness  Lungs:     Clear to auscultation bilaterally,  without wheezes, rhonchi or rales  Chest wall:    No tenderness to palpitation  Heart:    Regular rate and rhythm without murmurs, gallops, rubs  Abdomen:     Soft, non-tender, nondistended, normal bowel sounds, no organomegaly  Genitalia:    deferred  Rectal:    deferred  Extremities:   No clubbing, cyanosis or edema.  Pulses:   2+ and symmetric all extremities  Skin:   Skin color, texture, turgor normal, no rashes or lesions  Lymph nodes:   Cervical, supraclavicular, and axillary nodes normal  Neurologic:   CNII-XII intact. Normal strength, sensation and reflexes      throughout    Labs on Admission:  Basic Metabolic Panel:  Recent Labs Lab 12/04/14 2301  NA 137  K 3.9  CL 104  CO2 23  GLUCOSE 159*  BUN 20  CREATININE 1.16  CALCIUM 9.0   Liver Function Tests:  Recent Labs Lab 12/04/14 2301  AST 27  ALT 20  ALKPHOS 55  BILITOT 1.1  PROT 6.4*  ALBUMIN 3.8    Recent Labs Lab 12/04/14 2301  LIPASE 31   No results for input(s): AMMONIA in the last 168 hours. CBC:  Recent Labs Lab 12/04/14 2301  WBC 14.0*  NEUTROABS 12.4*  HGB 13.8  HCT 41.3  MCV 91.6  PLT 91*   Cardiac Enzymes: No results for input(s): CKTOTAL, CKMB, CKMBINDEX, TROPONINI in the last 168 hours.  BNP (last 3 results) No results for input(s): PROBNP in the last 8760 hours. CBG: No results for input(s): GLUCAP in the last 168 hours.  Radiological Exams on Admission: Dg Chest 2 View  12/04/2014   CLINICAL DATA:  Weakness today.  Chronic headache.  EXAM: CHEST  2 VIEW  COMPARISON:  10/03/2014  FINDINGS: Shallow inspiration with elevation of left hemidiaphragm. Scarring and atelectasis in the left lung base is similar to prior study. Heart size and pulmonary vascularity are normal for technique. No focal consolidation in the lungs. No pneumothorax. Degenerative changes  in the spine and shoulders. Postoperative changes in the mediastinum.  IMPRESSION: Elevation of left hemidiaphragm with  scarring in the left lung base. No focal consolidation in the lungs.   Electronically Signed   By: Lucienne Capers M.D.   On: 12/04/2014 23:46    EKG: Independently reviewed.  Assessment/Plan Principal Problem:   UTI (urinary tract infection) Active Problems:   HTN (hypertension)   Chronic systolic CHF (congestive heart failure)   Fever   Sepsis   1. UTI causing sepsis - 1. IVF at 125 cc/hr 2. Rocephin; prior cultures of e.coli were sensitive to this, always had E.Coli as predominate organism, did have a pseudomonas as well last time though. 3. Cultures pending, GS already shows GNR 4. Tylenol PRN fever 5. Repeat labs in AM 1. CBC to trend leukocytosis 6. Needs follow up with urology (per patient and wife Dr. Risa Grill apparently has a procedure planned it sounds like to try and help prevent recurrences). 2. HTN - continue home meds 3. H/o Systolic CHF - monitor for fluid overload while on IVF to treat sepsis    Code Status: Full  Family Communication: Wife at bedside Disposition Plan: Admit to inpatient   Time spent: 25 min  GARDNER, JARED M. [facility] Pager [phone]  If 7AM-7PM, please contact the day team taking care of the patient [email or web address] Password [email or web address] 12/05/2014, 3:09 AM

## 2014-12-06 DIAGNOSIS — I5022 Chronic systolic (congestive) heart failure: Secondary | ICD-10-CM

## 2014-12-06 LAB — BASIC METABOLIC PANEL WITH GFR
Anion gap: 9 (ref 5–15)
BUN: 18 mg/dL (ref 6–20)
CO2: 19 mmol/L — ABNORMAL LOW (ref 22–32)
Calcium: 8.2 mg/dL — ABNORMAL LOW (ref 8.9–10.3)
Chloride: 110 mmol/L (ref 101–111)
Creatinine, Ser: 1.05 mg/dL (ref 0.61–1.24)
GFR calc Af Amer: >60 mL/min
GFR calc non Af Amer: >60 mL/min
Glucose, Bld: 95 mg/dL (ref 65–99)
Potassium: 4.4 mmol/L (ref 3.5–5.1)
Sodium: 138 mmol/L (ref 135–145)

## 2014-12-06 LAB — CBC
HCT: 36 % — ABNORMAL LOW (ref 39.0–52.0)
Hemoglobin: 11.7 g/dL — ABNORMAL LOW (ref 13.0–17.0)
MCH: 30.1 pg (ref 26.0–34.0)
MCHC: 32.5 g/dL (ref 30.0–36.0)
MCV: 92.5 fL (ref 78.0–100.0)
Platelets: 76 10*3/uL — ABNORMAL LOW (ref 150–400)
RBC: 3.89 MIL/uL — ABNORMAL LOW (ref 4.22–5.81)
RDW: 14.3 % (ref 11.5–15.5)
WBC: 8.5 10*3/uL (ref 4.0–10.5)

## 2014-12-06 NOTE — Progress Notes (Signed)
[facility] PROGRESS NOTE  Ronald [name] [id number] DOB: 12-26-1927 DOA: 12/04/2014 PCP: Nyoka Cowden, MD  Brief Summary  Ronald [name] is a 81 y.o. male with extensive history of recurrent UTIs over past several months, always with an E.coli and more recently with E. Coli and Pseudomonas.  UTIs are occuring in setting of damage to urethera following history of radiotherapy some years ago for prostate cancer (was successful in clearing the tumor).  He presented with usual UTI symptoms of dysuria, feeling of urinary retention, generalized weakness, fever, chills.  He had evidence of UTI on his UA and was febrilei at admission.    Assessment/Plan  Sepsis secondary to urinary tract infection, fever resolving, leukocytosis resolved -  Repeat lactic acid (still elevated last night) -  Continue ceftazidime  -  Urine culture growing GNR -  Recommend close follow-up with urology, Dr. Risa Grill -  Continue Flomax  Essential hypertension -  Blood pressure stable, not on blood pressure medication  Chronic systolic congestive heart failure with ejection fraction of 35-40% -  Patient is not on ACE inhibitor, ARB, beta blocker -  Defer to primary care doctor and cardiologist  CAD status post CABG in [phone] vessels in 19985 vessels -  Continue high-dose statin -  Patient is not on beta blocker, defer to cardiology -  Continue Imdur -  Continue aspirin  Leukocytosis, likely secondary to sepsis, resolved  Normocytic anemia, hemoglobin near baseline -  Defer to primary care doctor  Chronic thrombocytopenia, platelets trending down towards his baseline of 60-70 000 -  Monitor for bleeding  Diet:  Healthy heart Access:  PIV IVF:  off Proph:  Lovenox  Code Status: Full code Family Communication: Patient and wife Disposition Plan: Pending repeat lactic acid and urine culture with home health  services.   Consultants:  None  Procedures:  None  Antibiotics:  Ceftazidime 9/9   HPI/Subjective:  Patient states that he continues to have very low energy, difficulty urinating, pain in the suprapubic area  Objective: Filed Vitals:   12/05/14 1300 12/05/14 2121 12/06/14 0428 12/06/14 1318  BP: 109/43 122/51 148/66 101/45  Pulse: 69 67 62 98  Temp: 99.5 F (37.5 C) 99.8 F (37.7 C) 99.1 F (37.3 C) 98.7 F (37.1 C)  TempSrc: Oral Oral Oral Oral  Resp: 18 18 18 19   Height:      Weight:      SpO2: 98% 94% 92% 100%    Intake/Output Summary (Last 24 hours) at 12/06/14 1600 Last data filed at 12/06/14 0831  Gross per 24 hour  Intake    360 ml  Output      0 ml  Net    360 ml   Filed Weights   12/05/14 0142  Weight: 71.6 kg (157 lb 13.6 oz)   Body mass index is 27.08 kg/(m^2).  Exam:   General:  Adult male, No acute distress, sitting in chair,   HEENT:  NCAT, MMM  Cardiovascular:  RRR, nl S1, S2 no mrg, 2+ pulses, warm extremities  Respiratory:  CTAB, no increased WOB  Abdomen:   NABS, soft, mildly distended, tender to palpation in the suprapubic area without rebound or guarding  MSK:   Normal tone and bulk, no LEE  Neuro:  Grossly intact  Data Reviewed: Basic Metabolic Panel:  Recent Labs Lab 12/04/14 2301 12/05/14 0454 12/06/14 0425  NA 137 137 138  K 3.9 3.5 4.4  CL 104 107 110  CO2 23 23 19*  GLUCOSE  159* 146* 95  BUN 20 19 18   CREATININE 1.16 1.05 1.05  CALCIUM 9.0 8.4* 8.2*   Liver Function Tests:  Recent Labs Lab 12/04/14 2301  AST 27  ALT 20  ALKPHOS 55  BILITOT 1.1  PROT 6.4*  ALBUMIN 3.8    Recent Labs Lab 12/04/14 2301  LIPASE 31   No results for input(s): AMMONIA in the last 168 hours. CBC:  Recent Labs Lab 12/04/14 2301 12/05/14 0454 12/06/14 0425  WBC 14.0* 15.1* 8.5  NEUTROABS 12.4*  --   --   HGB 13.8 12.8* 11.7*  HCT 41.3 38.8* 36.0*  MCV 91.6 92.2 92.5  PLT 91* 90* 76*    Recent Results  (from the past 240 hour(s))  Blood culture (routine x 2)     Status: None (Preliminary result)   Collection Time: 12/04/14 10:36 PM  Result Value Ref Range Status   Specimen Description BLOOD RIGHT ARM  Final   Special Requests BOTTLES DRAWN AEROBIC AND ANAEROBIC 10ML  Final   Culture NO GROWTH 2 DAYS  Final   Report Status PENDING  Incomplete  Blood culture (routine x 2)     Status: None (Preliminary result)   Collection Time: 12/04/14 10:44 PM  Result Value Ref Range Status   Specimen Description BLOOD RIGHT FOREARM  Final   Special Requests BOTTLES DRAWN AEROBIC AND ANAEROBIC 5ML  Final   Culture NO GROWTH 2 DAYS  Final   Report Status PENDING  Incomplete  Gram stain     Status: None   Collection Time: 12/04/14 11:44 PM  Result Value Ref Range Status   Specimen Description URINE, CLEAN CATCH  Final   Special Requests Normal  Final   Gram Stain   Final    CYTOSPIN SMEAR WBC PRESENT,BOTH PMN AND MONONUCLEAR GRAM NEGATIVE RODS GRAM POSITIVE COCCI IN DIPLOS IN PAIRS CORRECTED ON 09/09 AT 0701: PREVIOUSLY REPORTED AS IN DIPLOS IN PAIRS CRITICAL RESULT CALLED TO, READ BACK BY AND VERIFIED WITH: H REINGLY @0041  12/05/14 MKELLY    Report Status 12/05/2014 FINAL  Final  Urine culture     Status: None (Preliminary result)   Collection Time: 12/04/14 11:44 PM  Result Value Ref Range Status   Specimen Description URINE, CLEAN CATCH  Final   Special Requests Normal  Final   Culture 50,000 COLONIES/mL GRAM NEGATIVE RODS  Final   Report Status PENDING  Incomplete     Studies: Dg Chest 2 View  12/04/2014   CLINICAL DATA:  Weakness today.  Chronic headache.  EXAM: CHEST  2 VIEW  COMPARISON:  10/03/2014  FINDINGS: Shallow inspiration with elevation of left hemidiaphragm. Scarring and atelectasis in the left lung base is similar to prior study. Heart size and pulmonary vascularity are normal for technique. No focal consolidation in the lungs. No pneumothorax. Degenerative changes in the spine  and shoulders. Postoperative changes in the mediastinum.  IMPRESSION: Elevation of left hemidiaphragm with scarring in the left lung base. No focal consolidation in the lungs.   Electronically Signed   By: Lucienne Capers M.D.   On: 12/04/2014 23:46    Scheduled Meds: . aspirin EC  81 mg Oral Daily  . atorvastatin  40 mg Oral QPM  . cefTAZidime (FORTAZ)  IV  1 g Intravenous Q24H  . enoxaparin (LOVENOX) injection  40 mg Subcutaneous Q24H  . galantamine  8 mg Oral Q breakfast  . hydroxypropyl methylcellulose / hypromellose  1 drop Both Eyes BID  . Influenza vac split quadrivalent PF  0.5 mL Intramuscular Tomorrow-1000  . isosorbide mononitrate  30 mg Oral q morning - 10a  . tamsulosin  0.4 mg Oral QPC breakfast   Continuous Infusions:    Principal Problem:   UTI (urinary tract infection) Active Problems:   HTN (hypertension)   Chronic systolic CHF (congestive heart failure)   Fever   Sepsis    Time spent: 30 min    Ronald [name], [facility] Pager [phone]. If 7PM-7AM, please contact night-coverage at [email or web address], password [facility] 12/06/2014, 4:00 PM  LOS: 1 day

## 2014-12-06 NOTE — Care Management Note (Addendum)
Case Management Note  Patient Details  Name: Ronald [name] MRN: [id number] Date of Birth: Jan 20, 1928  Subjective/Objective:   81 yr old male admitted with recurrent UTI. Weakness.              Action/Plan: Case manager spoke with patient and wife concerning home health and DME needs at discharge. Choice was offered. Mrs. Bartos states that they have used [facility] in the past and will do so now. Referral was called to [place], [facility].  RN will contact MD for [facility] and F2F order. Patient uses a cane at home, not sure at this time if a walker is needed. Case manager will continue to monitor.   Expected Discharge Date:                  Expected Discharge Plan:   Home with Home Health  In-House Referral:  NA  Discharge planning Services  CM Consult  Post Acute Care Choice:  Home Health Choice offered to:  Spouse, Patient  DME Arranged:  N/A DME Agency:     HH Arranged:  PT HH Agency:  [facility]  Status of Service:  In process, will continue to follow  Medicare Important Message Given:    Date Medicare IM Given:    Medicare IM give by:    Date Additional Medicare IM Given:    Additional Medicare Important Message give by:     If discussed at [place] of Stay Meetings, dates discussed:    Additional Comments:  Ninfa Meeker, RN 12/06/2014, 10:23 AM

## 2014-12-07 DIAGNOSIS — A4152 Sepsis due to Pseudomonas: Secondary | ICD-10-CM | POA: Diagnosis not present

## 2014-12-07 LAB — BASIC METABOLIC PANEL
Anion gap: 6 (ref 5–15)
BUN: 15 mg/dL (ref 6–20)
CO2: 20 mmol/L — ABNORMAL LOW (ref 22–32)
Calcium: 8.1 mg/dL — ABNORMAL LOW (ref 8.9–10.3)
Chloride: 113 mmol/L — ABNORMAL HIGH (ref 101–111)
Creatinine, Ser: 0.89 mg/dL (ref 0.61–1.24)
GFR calc Af Amer: >60 mL/min (ref >60)
GFR calc non Af Amer: >60 mL/min (ref >60)
Glucose, Bld: 95 mg/dL (ref 65–99)
Potassium: 3.7 mmol/L (ref 3.5–5.1)
Sodium: 139 mmol/L (ref 135–145)

## 2014-12-07 LAB — CBC
HCT: 35.6 % — ABNORMAL LOW (ref 39.0–52.0)
Hemoglobin: 12 g/dL — ABNORMAL LOW (ref 13.0–17.0)
MCH: 30.9 pg (ref 26.0–34.0)
MCHC: 33.7 g/dL (ref 30.0–36.0)
MCV: 91.8 fL (ref 78.0–100.0)
Platelets: 77 10*3/uL — ABNORMAL LOW (ref 150–400)
RBC: 3.88 MIL/uL — ABNORMAL LOW (ref 4.22–5.81)
RDW: 14.3 % (ref 11.5–15.5)
WBC: 5.1 10*3/uL (ref 4.0–10.5)

## 2014-12-07 LAB — URINE CULTURE
Culture: 50000
Special Requests: NORMAL

## 2014-12-07 LAB — LACTIC ACID, PLASMA: Lactic Acid, Venous: 1 mmol/L (ref 0.5–2.0)

## 2014-12-07 MED ORDER — CIPROFLOXACIN HCL 500 MG PO TABS: 500.0000 mg | ORAL_TABLET | Freq: Two times a day (BID) | ORAL | Status: DC

## 2014-12-07 NOTE — Progress Notes (Signed)
Patient discharge home with wife. Will pick up medication at pharmacy. Discharge instructions given.

## 2014-12-07 NOTE — Progress Notes (Signed)
Cm called and advised Ronald [name] with [facility] that pt being discharged today with [facility] PT and choice previously offered and pt chose [facility]. Pt states that he has a cane and shower chair at home and did not want a walker or any other DME needs. Pt states that he has a non slip shower and handrails in the shower.  Wife at bedside and able to provider 24 hour supervision. No further Cm or d/c planning needs communicated.

## 2014-12-07 NOTE — Discharge Summary (Signed)
Physician Discharge Summary  Ronald [name] [id number] DOB: 02/14/1928 DOA: 12/04/2014  PCP: Nyoka Cowden, MD  Admit date: 12/04/2014 Discharge date: 12/07/2014  Recommendations for Outpatient Follow-up:  1. F/u with Dr. Risa Grill for possible suprapubic catheter placement.  Call office on Monday to schedule appointment 2. Started on ciprofloxacin and given a 2 week supply.  Needs a minimum of 7 days, but should probably continue antibiotics through procedure and potentially for a few days after.  Defer to Dr. Risa Grill, however 3. F/u pending blood cultures  Discharge Diagnoses:  Principal Problem:   Sepsis Active Problems:   HTN (hypertension)   Urethral stricture   Chronic systolic CHF (congestive heart failure)   Fever   UTI (urinary tract infection)   Discharge Condition: stable, improved  Diet recommendation: low sodium  Wt Readings from Last 3 Encounters:  12/05/14 71.6 kg (157 lb 13.6 oz)  10/14/14 73.029 kg (161 lb)  10/03/14 74.254 kg (163 lb 11.2 oz)    History of present illness:  Ronald [name] is a 81 y.o. male with extensive history of recurrent UTIs over past several months, typically with E.coli and more recently with E. Coli and Pseudomonas. UTI have been occuring in the setting of damage to urethera following history of radiotherapy some years ago for prostate cancer (was successful in clearing of the tumor). He presented with usual UTI symptoms of dysuria, feeling of urinary retention, generalized weakness, fever, chills. He had evidence of UTI on his UA and was febrile.    Hospital Course:   Sepsis secondary to urinary tract infection present at time of admission.  Not catheter associated.  He had a WBC of 15 000 and fever to 101.51F at admission.  He was started on ceftazidime and his fever, lactic acidosis, and leukocytosis resolved.  His urine culture grew pseudomonas which was sensitive to ciprofloxacin and he was given a prescription for a two week  course of antibiotics.  He continued flomax and is in the process of scheduling an appointment with Dr. Risa Grill for a possible suprapubic catheter in the next 1-2 weeks.    Essential hypertension, blood pressure stable, not on blood pressure medication  Chronic systolic congestive heart failure with ejection fraction of 35-40%.  Patient is not on ACE inhibitor, ARB, beta blocker due to bradycardia and hypotension per cardiology notes.  Defer to primary care doctor and cardiologist.    CAD status post CABG in 19896 vessels in 19985 vessels.  Continue high-dose statin.  Patient is not on beta blocker due to bradycardia and hypotension.  Continue Imdur and aspirin.    Leukocytosis, likely secondary to sepsis, resolved  Normocytic anemia, hemoglobin near baseline  Chronic thrombocytopenia, platelets trending down towards his baseline of 60-70 000  Consultants:  None Procedures:  None Antibiotics:  Ceftazidime 9/9    Discharge Exam: Filed Vitals:   12/07/14 0515  BP: 131/62  Pulse: 51  Temp: 98.6 F (37 C)  Resp: 18   Filed Vitals:   12/06/14 0428 12/06/14 1318 12/06/14 2100 12/07/14 0515  BP: 148/66 101/45 111/58 131/62  Pulse: 62 98  51  Temp: 99.1 F (37.3 C) 98.7 F (37.1 C) 98.8 F (37.1 C) 98.6 F (37 C)  TempSrc: Oral Oral Oral Oral  Resp: 18 19 18 18   Height:      Weight:      SpO2: 92% 100% 97% 95%     General: Adult male, No acute distress, sitting in chair,   HEENT: NCAT, MMM  Cardiovascular: RRR, nl S1, S2 no mrg, 2+ pulses, warm extremities  Respiratory: CTAB, no increased WOB  Abdomen: NABS, soft, nondistended, nontender  MSK: Normal tone and bulk, no LEE  Neuro: Grossly intact  Discharge Instructions      Discharge Instructions    (HEART FAILURE PATIENTS) Call MD:  Anytime you have any of the following symptoms: 1) 3 pound weight gain in 24 hours or 5 pounds in 1 week 2) shortness of breath, with or without a dry hacking cough 3)  swelling in the hands, feet or stomach 4) if you have to sleep on extra pillows at night in order to breathe.    Complete by:  As directed      Call MD for:  difficulty breathing, headache or visual disturbances    Complete by:  As directed      Call MD for:  extreme fatigue    Complete by:  As directed      Call MD for:  hives    Complete by:  As directed      Call MD for:  persistant dizziness or light-headedness    Complete by:  As directed      Call MD for:  persistant nausea and vomiting    Complete by:  As directed      Call MD for:  severe uncontrolled pain    Complete by:  As directed      Call MD for:  temperature >100.4    Complete by:  As directed      Diet - low sodium heart healthy    Complete by:  As directed      Discharge instructions    Complete by:  As directed   You were hospitalized with a urinary tract infection.  Please take ciprofloxacin and call Dr. Cy Blamer office Monday morning to schedule your procedure/appointment.  If you have fevers, chills, inability to urinate, or any other concerning symptoms, please return to the hospital.     Increase activity slowly    Complete by:  As directed             Medication List    TAKE these medications        acetaminophen 500 MG tablet  Commonly known as:  TYLENOL  Take 500 mg by mouth every 6 (six) hours as needed for pain or fever.     aspirin 81 MG EC tablet  Take 1 tablet (81 mg total) by mouth daily.     atorvastatin 80 MG tablet  Commonly known as:  LIPITOR  Take 40 mg by mouth every evening.     CALCIUM + D PO  Take 1 tablet by mouth 2 (two) times daily.     calcium carbonate 500 MG chewable tablet  Commonly known as:  TUMS - dosed in mg elemental calcium  Chew 2 tablets by mouth as needed for indigestion or heartburn.     ciprofloxacin 500 MG tablet  Commonly known as:  CIPRO  Take 1 tablet (500 mg total) by mouth 2 (two) times daily.     flavoxATE 100 MG tablet  Commonly known as:  URISPAS   Take 1 tablet (100 mg total) by mouth 3 (three) times daily as needed for bladder spasms.     galantamine 8 MG 24 hr capsule  Commonly known as:  RAZADYNE ER  Take 8 mg by mouth daily with breakfast.     HYDROcodone-acetaminophen 5-325 MG per tablet  Commonly known as:  NORCO/VICODIN  Take 1 tablet by mouth every 6 (six) hours as needed for moderate pain.     hydroxypropyl methylcellulose / hypromellose 2.5 % ophthalmic solution  Commonly known as:  ISOPTO TEARS / GONIOVISC  Place 1 drop into both eyes 2 (two) times daily.     isosorbide mononitrate 60 MG 24 hr tablet  Commonly known as:  IMDUR  Take 0.5 tablets (30 mg total) by mouth every morning.     multivitamin with minerals Tabs tablet  Take 1 tablet by mouth daily.     nitroGLYCERIN 0.4 MG SL tablet  Commonly known as:  NITROSTAT  Place 0.4 mg under the tongue every 5 (five) minutes as needed for chest pain.     ranitidine 150 MG tablet  Commonly known as:  ZANTAC  Take 150 mg by mouth as needed for heartburn.     RAPAFLO 8 MG Caps capsule  Generic drug:  silodosin  Take 8 mg by mouth every evening.     Vitamin D 2000 UNITS tablet  Take 2,000 Units by mouth daily.       Follow-up Information    Follow up with Nyoka Cowden, MD.   Specialty:  Internal Medicine   Contact information:   [place] [address] [phone]       Follow up with [facility], MD. Schedule an appointment as soon as possible for a visit in 1 week.   Specialty:  Urology   Contact information:   [address] [address] [phone]       Follow up with Lauree Chandler, MD.   Specialty:  Cardiology   Contact information:   [address] [place] [address] [phone]        The results of significant diagnostics from this hospitalization (including imaging, microbiology, ancillary and laboratory) are listed below for reference.    Significant Diagnostic  Studies: Dg Chest 2 View  12/04/2014   CLINICAL DATA:  Weakness today.  Chronic headache.  EXAM: CHEST  2 VIEW  COMPARISON:  10/03/2014  FINDINGS: Shallow inspiration with elevation of left hemidiaphragm. Scarring and atelectasis in the left lung base is similar to prior study. Heart size and pulmonary vascularity are normal for technique. No focal consolidation in the lungs. No pneumothorax. Degenerative changes in the spine and shoulders. Postoperative changes in the mediastinum.  IMPRESSION: Elevation of left hemidiaphragm with scarring in the left lung base. No focal consolidation in the lungs.   Electronically Signed   By: Lucienne Capers M.D.   On: 12/04/2014 23:46    Microbiology: Recent Results (from the past 240 hour(s))  Blood culture (routine x 2)     Status: None (Preliminary result)   Collection Time: 12/04/14 10:36 PM  Result Value Ref Range Status   Specimen Description BLOOD RIGHT ARM  Final   Special Requests BOTTLES DRAWN AEROBIC AND ANAEROBIC 10ML  Final   Culture NO GROWTH 2 DAYS  Final   Report Status PENDING  Incomplete  Blood culture (routine x 2)     Status: None (Preliminary result)   Collection Time: 12/04/14 10:44 PM  Result Value Ref Range Status   Specimen Description BLOOD RIGHT FOREARM  Final   Special Requests BOTTLES DRAWN AEROBIC AND ANAEROBIC 5ML  Final   Culture NO GROWTH 2 DAYS  Final   Report Status PENDING  Incomplete  Gram stain     Status: None   Collection Time: 12/04/14 11:44 PM  Result Value Ref  Range Status   Specimen Description URINE, CLEAN CATCH  Final   Special Requests Normal  Final   Gram Stain   Final    CYTOSPIN SMEAR WBC PRESENT,BOTH PMN AND MONONUCLEAR GRAM NEGATIVE RODS GRAM POSITIVE COCCI IN DIPLOS IN PAIRS CORRECTED ON 09/09 AT 0701: PREVIOUSLY REPORTED AS IN DIPLOS IN PAIRS CRITICAL RESULT CALLED TO, READ BACK BY AND VERIFIED WITH: H REINGLY @0041  12/05/14 MKELLY    Report Status 12/05/2014 FINAL  Final  Urine culture      Status: None (Preliminary result)   Collection Time: 12/04/14 11:44 PM  Result Value Ref Range Status   Specimen Description URINE, CLEAN CATCH  Final   Special Requests Normal  Final   Culture 50,000 COLONIES/mL PSEUDOMONAS AERUGINOSA  Final   Report Status PENDING  Incomplete   Organism ID, Bacteria PSEUDOMONAS AERUGINOSA  Final      Susceptibility   Pseudomonas aeruginosa - MIC*    CEFTAZIDIME 2 SENSITIVE Sensitive     CIPROFLOXACIN <=0.25 SENSITIVE Sensitive     GENTAMICIN <=1 SENSITIVE Sensitive     IMIPENEM 2 SENSITIVE Sensitive     PIP/TAZO 8 SENSITIVE Sensitive     CEFEPIME <=1 SENSITIVE Sensitive     * 50,000 COLONIES/mL PSEUDOMONAS AERUGINOSA     Labs: Basic Metabolic Panel:  Recent Labs Lab 12/04/14 2301 12/05/14 0454 12/06/14 0425 12/07/14 0547  NA 137 137 138 139  K 3.9 3.5 4.4 3.7  CL 104 107 110 113*  CO2 23 23 19* 20*  GLUCOSE 159* 146* 95 95  BUN 20 19 18 15   CREATININE 1.16 1.05 1.05 0.89  CALCIUM 9.0 8.4* 8.2* 8.1*   Liver Function Tests:  Recent Labs Lab 12/04/14 2301  AST 27  ALT 20  ALKPHOS 55  BILITOT 1.1  PROT 6.4*  ALBUMIN 3.8    Recent Labs Lab 12/04/14 2301  LIPASE 31   No results for input(s): AMMONIA in the last 168 hours. CBC:  Recent Labs Lab 12/04/14 2301 12/05/14 0454 12/06/14 0425 12/07/14 0547  WBC 14.0* 15.1* 8.5 5.1  NEUTROABS 12.4*  --   --   --   HGB 13.8 12.8* 11.7* 12.0*  HCT 41.3 38.8* 36.0* 35.6*  MCV 91.6 92.2 92.5 91.8  PLT 91* 90* 76* 77*   Cardiac Enzymes: No results for input(s): CKTOTAL, CKMB, CKMBINDEX, TROPONINI in the last 168 hours. BNP: BNP (last 3 results) No results for input(s): BNP in the last 8760 hours.  ProBNP (last 3 results) No results for input(s): PROBNP in the last 8760 hours.  CBG: No results for input(s): GLUCAP in the last 168 hours.  Time coordinating discharge: 35 minutes  Signed:  Zachary Nole  [facility] 12/07/2014, 11:52 AM

## 2014-12-07 NOTE — Care Management Important Message (Signed)
Important Message  Patient Details  Name: AMERE WESTROPE MRN: [id number] Date of Birth: 25-Nov-1927   Medicare Important Message Given:  Yes-second notification given    Guido Sander, RN 12/07/2014, 12:39 PM

## 2014-12-08 DIAGNOSIS — I2510 Atherosclerotic heart disease of native coronary artery without angina pectoris: Secondary | ICD-10-CM | POA: Diagnosis not present

## 2014-12-08 DIAGNOSIS — N390 Urinary tract infection, site not specified: Secondary | ICD-10-CM | POA: Diagnosis not present

## 2014-12-08 DIAGNOSIS — I255 Ischemic cardiomyopathy: Secondary | ICD-10-CM | POA: Diagnosis not present

## 2014-12-08 DIAGNOSIS — I10 Essential (primary) hypertension: Secondary | ICD-10-CM | POA: Diagnosis not present

## 2014-12-08 DIAGNOSIS — D696 Thrombocytopenia, unspecified: Secondary | ICD-10-CM | POA: Diagnosis not present

## 2014-12-08 DIAGNOSIS — Z951 Presence of aortocoronary bypass graft: Secondary | ICD-10-CM | POA: Diagnosis not present

## 2014-12-08 DIAGNOSIS — A419 Sepsis, unspecified organism: Secondary | ICD-10-CM | POA: Diagnosis not present

## 2014-12-08 DIAGNOSIS — I5022 Chronic systolic (congestive) heart failure: Secondary | ICD-10-CM | POA: Diagnosis not present

## 2014-12-09 LAB — CULTURE, BLOOD (ROUTINE X 2)
Culture: NO GROWTH
Culture: NO GROWTH

## 2014-12-11 ENCOUNTER — Telehealth: Payer: Self-pay | Admitting: *Deleted

## 2014-12-11 ENCOUNTER — Other Ambulatory Visit: Payer: Self-pay | Admitting: Urology

## 2014-12-11 DIAGNOSIS — A419 Sepsis, unspecified organism: Secondary | ICD-10-CM | POA: Diagnosis not present

## 2014-12-11 DIAGNOSIS — I255 Ischemic cardiomyopathy: Secondary | ICD-10-CM | POA: Diagnosis not present

## 2014-12-11 DIAGNOSIS — I5022 Chronic systolic (congestive) heart failure: Secondary | ICD-10-CM | POA: Diagnosis not present

## 2014-12-11 DIAGNOSIS — I10 Essential (primary) hypertension: Secondary | ICD-10-CM | POA: Diagnosis not present

## 2014-12-11 DIAGNOSIS — I2510 Atherosclerotic heart disease of native coronary artery without angina pectoris: Secondary | ICD-10-CM | POA: Diagnosis not present

## 2014-12-11 DIAGNOSIS — N390 Urinary tract infection, site not specified: Secondary | ICD-10-CM | POA: Diagnosis not present

## 2014-12-11 NOTE — Telephone Encounter (Signed)
Transition Care Management Follow-up Telephone Call  How have you been since you were released from the hospital? good   Do you understand why you were in the hospital? yes   Do you understand the discharge instrcutions? yes  Items Reviewed:  Medications reviewed: yes added cipro  Allergies reviewed: yes  Dietary changes reviewed: yes  Referrals reviewed: yes   Functional Questionnaire:   Activities of Daily Living (ADLs):   He states they are independent in the following: ambulation, bathing and hygiene, feeding, continence, grooming, toileting and dressing States they require assistance with the following: none   Any transportation issues/concerns?: no   Any patient concerns? no   Confirmed importance and date/time of follow-up visits scheduled: yes   Confirmed with patient if condition begins to worsen call PCP or go to the ER.  Patient was given the Call-a-Nurse line [phone]: yes Patient request to see Dr Raliegh Ip after his procedure 12/22/14

## 2014-12-15 ENCOUNTER — Encounter (HOSPITAL_COMMUNITY): Payer: Self-pay

## 2014-12-15 DIAGNOSIS — I255 Ischemic cardiomyopathy: Secondary | ICD-10-CM | POA: Diagnosis not present

## 2014-12-15 DIAGNOSIS — I10 Essential (primary) hypertension: Secondary | ICD-10-CM | POA: Diagnosis not present

## 2014-12-15 DIAGNOSIS — I2510 Atherosclerotic heart disease of native coronary artery without angina pectoris: Secondary | ICD-10-CM | POA: Diagnosis not present

## 2014-12-15 DIAGNOSIS — N390 Urinary tract infection, site not specified: Secondary | ICD-10-CM | POA: Diagnosis not present

## 2014-12-15 DIAGNOSIS — A419 Sepsis, unspecified organism: Secondary | ICD-10-CM | POA: Diagnosis not present

## 2014-12-15 DIAGNOSIS — I5022 Chronic systolic (congestive) heart failure: Secondary | ICD-10-CM | POA: Diagnosis not present

## 2014-12-16 NOTE — Patient Instructions (Addendum)
YOUR PROCEDURE IS SCHEDULED ON :  12/22/14  REPORT TO [facility] MAIN ENTRANCE FOLLOW SIGNS TO EAST ELEVATOR - GO TO 3rd FLOOR CHECK IN AT 3 EAST NURSES STATION (SHORT STAY) AT:  5:30 AM  CALL THIS NUMBER IF YOU HAVE PROBLEMS THE MORNING OF SURGERY [phone]  REMEMBER:ONLY 1 PER PERSON MAY GO TO SHORT STAY WITH YOU TO GET READY THE MORNING OF YOUR SURGERY  DO NOT EAT FOOD OR DRINK LIQUIDS AFTER MIDNIGHT  TAKE THESE MEDICINES THE MORNING OF SURGERY: ISOSORBIDE / GALANTAMINE / MAY TAKE HYDROCODONE IF NEEDED  YOU MAY NOT HAVE ANY METAL ON YOUR BODY INCLUDING HAIR PINS AND PIERCING'S. DO NOT WEAR JEWELRY, MAKEUP, LOTIONS, POWDERS OR PERFUMES. DO NOT WEAR NAIL POLISH. DO NOT SHAVE 48 HRS PRIOR TO SURGERY. MEN MAY SHAVE FACE AND NECK.  DO NOT [place]. [facility] IS NOT RESPONSIBLE FOR VALUABLES.  CONTACTS, DENTURES OR PARTIALS MAY NOT BE WORN TO SURGERY. LEAVE SUITCASE IN CAR. CAN BE BROUGHT TO ROOM AFTER SURGERY.  PATIENTS DISCHARGED THE DAY OF SURGERY WILL NOT BE ALLOWED TO DRIVE HOME.  PLEASE READ OVER THE FOLLOWING INSTRUCTION SHEETS _________________________________________________________________________________                                          [facility] - PREPARING FOR SURGERY  Before surgery, you can play an important role.  Because skin is not sterile, your skin needs to be as free of germs as possible.  You can reduce the number of germs on your skin by washing with CHG (chlorahexidine gluconate) soap before surgery.  CHG is an antiseptic cleaner which kills germs and bonds with the skin to continue killing germs even after washing. Please DO NOT use if you have an allergy to CHG or antibacterial soaps.  If your skin becomes reddened/irritated stop using the CHG and inform your nurse when you arrive at Short Stay. Do not shave (including legs and underarms) for at least 48 hours prior to the first CHG shower.  You may shave your  face. Please follow these instructions carefully:   1.  Shower with CHG Soap the night before surgery and the  morning of Surgery.   2.  If you choose to wash your hair, wash your hair first as usual with your  normal  Shampoo.   3.  After you shampoo, rinse your hair and body thoroughly to remove the  shampoo.                                         4.  Use CHG as you would any other liquid soap.  You can apply chg directly  to the skin and wash . Gently wash with scrungie or clean wascloth    5.  Apply the CHG Soap to your body ONLY FROM THE NECK DOWN.   Do not use on open                           Wound or open sores. Avoid contact with eyes, ears mouth and genitals (private parts).                        Genitals (  private parts) with your normal soap.              6.  Wash thoroughly, paying special attention to the area where your surgery  will be performed.   7.  Thoroughly rinse your body with warm water from the neck down.   8.  DO NOT shower/wash with your normal soap after using and rinsing off  the CHG Soap .                9.  Pat yourself dry with a clean towel.             10.  Wear clean night clothes to bed after shower             11.  Place clean sheets on your bed the night of your first shower and do not  sleep with pets.  Day of Surgery : Do not apply any lotions/deodorants the morning of surgery.  Please wear clean clothes to the hospital/surgery center.  FAILURE TO FOLLOW THESE INSTRUCTIONS MAY RESULT IN THE CANCELLATION OF YOUR SURGERY    PATIENT SIGNATURE_________________________________  ______________________________________________________________________

## 2014-12-17 ENCOUNTER — Encounter (HOSPITAL_COMMUNITY)
Admission: RE | Admit: 2014-12-17 | Discharge: 2014-12-17 | Disposition: A | Discharge status: Home / Self Care | Payer: Medicare Other | Source: Ambulatory Visit | Attending: Urology | Admitting: Urology

## 2014-12-17 ENCOUNTER — Encounter (HOSPITAL_COMMUNITY): Payer: Self-pay

## 2014-12-17 DIAGNOSIS — N359 Urethral stricture, unspecified: Secondary | ICD-10-CM | POA: Diagnosis not present

## 2014-12-17 DIAGNOSIS — Z01818 Encounter for other preprocedural examination: Secondary | ICD-10-CM | POA: Insufficient documentation

## 2014-12-17 DIAGNOSIS — N210 Calculus in bladder: Secondary | ICD-10-CM | POA: Insufficient documentation

## 2014-12-17 HISTORY — DX: Diverticulosis of intestine, part unspecified, without perforation or abscess without bleeding: K57.90

## 2014-12-17 HISTORY — DX: Calculus in bladder: N21.0

## 2014-12-17 HISTORY — DX: Personal history of other specified conditions: Z87.898

## 2014-12-17 HISTORY — DX: Nocturia: R35.1

## 2014-12-17 LAB — CBC
HCT: 44.7 % (ref 39.0–52.0)
Hemoglobin: 14.7 g/dL (ref 13.0–17.0)
MCH: 30.2 pg (ref 26.0–34.0)
MCHC: 32.9 g/dL (ref 30.0–36.0)
MCV: 92 fL (ref 78.0–100.0)
Platelets: 183 10*3/uL (ref 150–400)
RBC: 4.86 MIL/uL (ref 4.22–5.81)
RDW: 13.9 % (ref 11.5–15.5)
WBC: 8.5 10*3/uL (ref 4.0–10.5)

## 2014-12-18 ENCOUNTER — Ambulatory Visit (INDEPENDENT_AMBULATORY_CARE_PROVIDER_SITE_OTHER): Payer: Medicare Other | Admitting: Cardiovascular Disease

## 2014-12-18 ENCOUNTER — Encounter: Payer: Self-pay | Admitting: Cardiovascular Disease

## 2014-12-18 VITALS — BP 116/62 | HR 58 | Ht 65.0 in | Wt 156.4 lb

## 2014-12-18 DIAGNOSIS — I10 Essential (primary) hypertension: Secondary | ICD-10-CM

## 2014-12-18 DIAGNOSIS — N390 Urinary tract infection, site not specified: Secondary | ICD-10-CM | POA: Diagnosis not present

## 2014-12-18 DIAGNOSIS — I5022 Chronic systolic (congestive) heart failure: Secondary | ICD-10-CM | POA: Diagnosis not present

## 2014-12-18 DIAGNOSIS — I255 Ischemic cardiomyopathy: Secondary | ICD-10-CM

## 2014-12-18 DIAGNOSIS — E785 Hyperlipidemia, unspecified: Secondary | ICD-10-CM

## 2014-12-18 DIAGNOSIS — I2510 Atherosclerotic heart disease of native coronary artery without angina pectoris: Secondary | ICD-10-CM | POA: Diagnosis not present

## 2014-12-18 DIAGNOSIS — I25810 Atherosclerosis of coronary artery bypass graft(s) without angina pectoris: Secondary | ICD-10-CM

## 2014-12-18 DIAGNOSIS — A419 Sepsis, unspecified organism: Secondary | ICD-10-CM | POA: Diagnosis not present

## 2014-12-18 NOTE — Patient Instructions (Signed)

## 2014-12-18 NOTE — Progress Notes (Signed)
Chief Complaint  Patient presents with  . Fatigue      History of Present Illness: 81 yo male with history of CAD s/p CABG in 1989 and redo bypass in 1997 per Dr. Redmond Pulling, nephrolithiasis, HTN, HLD, mild dementia, prostate cancer here today for cardiac follow up. He has been followed in the past by Dr. Doreatha Lew. Leane Call May 2013 because of dyspnea and fatigue. This showed inferior scar with soft tissue attenuation and small region of anterior scar with possible small area of ischemia. This was felt to be low risk. He has been managed conservatively. He was seen in November 2014 and c/o fatigue and was hypotensive. Lisinopril was stopped and this resolved. Admitted to [facility] June 2015 with chest pain. Cardiac cath 08/28/13 with 95% left main stenosis, occluded LAD, occluded Circumflex, occluded RCA. Patent LIMA to LAD, patent SVG to OM supplying collaterals to RCA. Occluded SVG to RCA and SVG to intermediate branch. Medical therapy recommended. LVEF=35-40% by LVgram. His pain resolved with addition of Zantac, felt to be GI related. Hypotensive so Imdur dose lowered last visit. He was admitted to [facility] October 2015 with dilatation of urethral stricture and lithotripsy of bladder stones and readmitted with UTI and confusion. Most recently admitted to [facility] 12/05/14 with urosepsis.   He is here today for follow up. He has no exertional chest pain or pressure. No SOB or  dizziness. He is anxious about his upcoming procedure. No LE edema. Weight is stable.   Primary Care Physician: Dr. Burnice Logan  Last Lipid Profile:Lipid Panel     Component Value Date/Time   CHOL 126 08/28/2013 0020   TRIG 34 08/28/2013 0020   HDL 54 08/28/2013 0020   CHOLHDL 2.3 08/28/2013 0020   VLDL 7 08/28/2013 0020   LDLCALC 65 08/28/2013 0020    Past Medical History  Diagnosis Date  . Hyperlipidemia   . S/P CABG (coronary artery bypass graft)     [place]  . History of Bell's palsy    RIGHT SIDE-- NO RESIDUAL  . OAB (overactive bladder)   . Chronic cystitis   . Ischemic cardiomyopathy     ef 35-40% per cath 08-28-2013  . Coronary artery disease CARDIOLOGIST-  DR Grace Bushy  MI -- S/P  11/89 CABG x6 (70% circ; 90% PD; 60-70% distal left main; 30% left circ; 90% 1st diag;, 2nd diag and 3rd diag. w/90%,  mild stenosis LAD and 60-70% pLAD)  Re-do CABG x5 in 1997  . Wears hearing aid     bilateral  . Wears partial dentures   . At risk for sleep apnea     STOP-BANG= 4    SENT TO PCP 12-23-2013  . GERD (gastroesophageal reflux disease)   . Mild dementia   . Kidney stones "years ago"    "passed them"  . Hypertension     "not anymore" (01/02/2014)  . Myocardial infarction 1986; 1997  . Arthritis     "joints ache" (01/02/2014)  . CHF (congestive heart failure)   . Skipped heart beats     occasional  . Hx of dizziness   . Diverticulosis   . Urethral stricture   . Bladder calculi   . Nocturia   . Prostate cancer 1998    S/P  RADIACTIVE SEED IMPLANTS; UROLOGIST-  DR GRAPEY  . Melanoma of ear     "right"    Past Surgical History  Procedure Laterality Date  .  Eus  10/05/2011    Procedure: ESOPHAGEAL ENDOSCOPIC ULTRASOUND (EUS) RADIAL;  Surgeon: Arta Silence, MD;  Location: [facility];  Service: Endoscopy;  Laterality: N/A;  . Tonsillectomy and adenoidectomy  1954  . Laparoscopic cholecystectomy  12-23-2005  . Radioactive prostate seed implants  1998  . Cardiovascular stress test  08-01-2011  dr Angelena Form    inferior scar and possible soft tissue attenuation with minimal peri-infarct ischemia, small region of anterior ischemia and scar/  LVEF 53% LV wall motion with inferior hypokinesis/ no significant change from scan july 2011  . Cataract extraction w/ intraocular lens  implant, bilateral Bilateral 2007  . Cystoscopy with urethral dilatation N/A 12/25/2013    Procedure: CYSTOSCOPY WITH URETHRAL DILATATION, WITH BIOPSY;  Surgeon: Bernestine Amass, MD;  Location:  [facility];  Service: Urology;  Laterality: N/A;  . Melanoma excision  X 1    "ear"  . Cardiac catheterization  02-22-2005  dr Vidal Schwalbe    mild to moderate lv dysfunction with inferobasilar akinesis/  totally occluded SVG to Intermediate Diagonal and SVG to PDA and RCA branches, totally occluded native coronary circulation with diffuse disease pLAD diagonal system with potentially could be ischemic/  patent SVG to OM with collaterals to dRCA and patent LIMA to LAD and diagonal systemss  . Cardiac catheterization  08-28-2013  DR Daneen Schick    widely patent sequential left internal graft to the diagonal/LAD, widely patent SVG to OM with proximal 50% narrowing noted in the graft, total occlusion SVG's to RCA, RI, and the Diagonal/ LV dysfunction with inferobasal aneurysm and mid anterior wall region of akinesis/  overall EF 35-40%/  Total occlusion of the navtive circulation/  no significant change compared to 2006 cath  . Skin cancer excision  X 2    "top of head"  . Left heart catheterization with coronary/graft angiogram N/A 08/28/2013    Procedure: LEFT HEART CATHETERIZATION WITH Beatrix Fetters;  Surgeon: Sinclair Grooms, MD;  Location: [facility] CATH LAB;  Service: Cardiovascular;  Laterality: N/A;  . Coronary artery bypass graft  11/ 1989  &  10/ 1997    1989-- 6 vessel/  1997 Re-do 5 vessel    Current Outpatient Prescriptions  Medication Sig Dispense Refill  . acetaminophen (TYLENOL) 500 MG tablet Take 500 mg by mouth every 6 (six) hours as needed for pain or fever.     Marland Kitchen aspirin EC 81 MG EC tablet Take 1 tablet (81 mg total) by mouth daily. 30 tablet 0  . atorvastatin (LIPITOR) 80 MG tablet Take 40 mg by mouth every evening.    . ciprofloxacin (CIPRO) 500 MG tablet Take 1 tablet (500 mg total) by mouth 2 (two) times daily. (Patient taking differently: Take 500 mg by mouth 2 (two) times daily. For 2 weeks 12-07-14 to 12-21-14) 28 tablet 0  . flavoxATE (URISPAS) 100 MG tablet  Take 1 tablet (100 mg total) by mouth 3 (three) times daily as needed for bladder spasms. 60 tablet 3  . galantamine (RAZADYNE ER) 8 MG 24 hr capsule Take 8 mg by mouth daily with breakfast.    . HYDROcodone-acetaminophen (NORCO/VICODIN) 5-325 MG per tablet Take 1 tablet by mouth every 6 (six) hours as needed for moderate pain. 30 tablet 0  . hydroxypropyl methylcellulose (ISOPTO TEARS) 2.5 % ophthalmic solution Place 1 drop into both eyes 2 (two) times daily.     . isosorbide mononitrate (IMDUR) 60 MG 24 hr tablet Take 0.5 tablets (30 mg total) by mouth  every morning.    . nitroGLYCERIN (NITROSTAT) 0.4 MG SL tablet Place 0.4 mg under the tongue every 5 (five) minutes as needed for chest pain.     . ranitidine (ZANTAC) 150 MG tablet Take 150 mg by mouth as needed for heartburn.    . silodosin (RAPAFLO) 8 MG CAPS capsule Take 8 mg by mouth every evening.     . calcium carbonate (TUMS - DOSED IN MG ELEMENTAL CALCIUM) 500 MG chewable tablet Chew 2 tablets by mouth as needed for indigestion or heartburn.    . Calcium Carbonate-Vitamin D (CALCIUM + D PO) Take 1 tablet by mouth 2 (two) times daily.     . Cholecalciferol (VITAMIN D) 2000 UNITS tablet Take 2,000 Units by mouth daily.    . Multiple Vitamin (MULTIVITAMIN WITH MINERALS) TABS Take 1 tablet by mouth daily.     No current facility-administered medications for this visit.    Allergies  Allergen Reactions  . Pantoprazole Other (See Comments)    Headache and lightheaded  . Nitrofurantoin Hives  . Sulfa Antibiotics Hives and Itching    Social History   Social History  . Marital Status: Married    Spouse Name: N/A  . Number of Children: N/A  . Years of Education: N/A   Occupational History  . Not on file.   Social History Main Topics  . Smoking status: Former Smoker -- 40 years    Types: Cigars    Quit date: 12/24/1983  . Smokeless tobacco: Former Systems developer    Types: Chew    Quit date: 08/28/2013  . Alcohol Use: No  . Drug Use: No   . Sexual Activity: Not on file   Other Topics Concern  . Not on file   Social History Narrative    Family History  Problem Relation Age of Onset  . Heart disease Mother   . Diabetes Father   . Heart disease Brother     Review of Systems:  As stated in the HPI and otherwise negative.   BP 116/62 mmHg  Pulse 58  Ht 5\' 5"  (1.651 m)  Wt 156 lb 6.4 oz (70.943 kg)  BMI 26.03 kg/m2  SpO2 94%  Physical Examination: General: Well developed, well nourished, NAD HEENT: OP clear, mucus membranes moist SKIN: warm, dry. No rashes. Neuro: No focal deficits Musculoskeletal: Muscle strength 5/5 all ext Psychiatric: Mood and affect normal Neck: No JVD, no carotid bruits, no thyromegaly, no lymphadenopathy. Lungs:Clear bilaterally, no wheezes, rhonci, crackles Cardiovascular: Regular rate and rhythm. No murmurs, gallops or rubs. Abdomen:Soft. Bowel sounds present. Non-tender.  Extremities: No lower extremity edema. Pulses are 2 + in the bilateral DP/PT.  Cardiac cath 08/28/13: The left main coronary artery is is severely involved distally with a string-like obstruction of at least 95%. This vessel is heavily calcified..  The left anterior descending artery is totally occluded..  The left circumflex artery is totally occluded.  The right coronary artery is totally occluded proximally. Right to right homocollaterals and noted to feel a right ventricular branch.Marland Kitchen  BYPASS GRAFT ANGIOGRAPHY: The saphenous vein graft to the obtuse marginal is widely patent there is proximal segmental 30-50% narrowing. The obtuse marginal territory supplies collaterals to the distal right coronary.  The saphenous vein graft to the right coronary is totally occluded  The saphenous vein graft to the ramus/diagonal is totally occluded  The left internal mammary graft to the LAD and diagonal is widely patent  LEFT VENTRICULOGRAM: Left ventricular angiogram was done in the 30  RAO projection and revealed a well formed  inferoposterior aneurysm. The LV cavity is dilated. A focal portion of the mid anterior wall is akinetic. Overall LV function reveals an EF of 35-40%. No obvious mitral regurgitation is noted although power injection was not performed and mild regurgitation could be missed.  IMPRESSIONS: 1. Widely patent sequential left internal graft to the diagonal/LAD.  2. Widely patent saphenous vein graft to the obtuse marginal with proximal 50% narrowing noted in the graft.  3. Total occlusion of the saphenous vein grafts to the right coronary, the ramus intermedius, and the diagonal.  4. Left ventricular dysfunction with inferobasal aneurysm and mid anterior wall region of akinesis. Overall EF is 35-40% .  5. Total occlusion of the native circulation.  EKG:  EKG is not ordered today. The ekg ordered today demonstrates   Recent Labs: 12/04/2014: ALT 20 12/07/2014: BUN 15; Creatinine, Ser 0.89; Potassium 3.7; Sodium 139 12/17/2014: Hemoglobin 14.7; Platelets 183   Lipid Panel    Component Value Date/Time   CHOL 126 08/28/2013 0020   TRIG 34 08/28/2013 0020   HDL 54 08/28/2013 0020   CHOLHDL 2.3 08/28/2013 0020   VLDL 7 08/28/2013 0020   LDLCALC 65 08/28/2013 0020     Wt Readings from Last 3 Encounters:  12/18/14 156 lb 6.4 oz (70.943 kg)  12/17/14 155 lb (70.308 kg)  12/05/14 157 lb 13.6 oz (71.6 kg)     Other studies Reviewed: Additional studies/ records that were reviewed today include: . Review of the above records demonstrates:    Assessment and Plan:   1. CORONARY ARTERY DISEASE: Stable. Cardiac cath June 2015 with stable anatomy. Continue statin, ASA. Imdur. No beta blocker secondary to bradycardia. No Ace-inh secondary to hypotension. He can proceed with planned surgical procedure without further cardiac workup.   2. Hyperlipidemia: Continue statin. Lipids well controlled.   3. HTN: BP well controlled. No changes today.   4. Ischemic cardiomyopathy: LVEF=35-40% by LV gram. He does  not tolerate Ace-inh or beta blockers.   5. GERD: He uses Zantac prn.   Current medicines are reviewed at length with the patient today.  The patient does not have concerns regarding medicines.  The following changes have been made:  no change  Labs/ tests ordered today include:  No orders of the defined types were placed in this encounter.    Disposition:   FU with me in 6 months  Signed, Lauree Chandler, MD 12/18/2014 4:41 PM    [facility] [place], [place], [place]  [address] Phone: [phone]; Fax: [phone]

## 2014-12-19 DIAGNOSIS — I10 Essential (primary) hypertension: Secondary | ICD-10-CM | POA: Diagnosis not present

## 2014-12-19 DIAGNOSIS — I255 Ischemic cardiomyopathy: Secondary | ICD-10-CM | POA: Diagnosis not present

## 2014-12-19 DIAGNOSIS — N390 Urinary tract infection, site not specified: Secondary | ICD-10-CM | POA: Diagnosis not present

## 2014-12-19 DIAGNOSIS — I5022 Chronic systolic (congestive) heart failure: Secondary | ICD-10-CM | POA: Diagnosis not present

## 2014-12-19 DIAGNOSIS — A419 Sepsis, unspecified organism: Secondary | ICD-10-CM | POA: Diagnosis not present

## 2014-12-19 DIAGNOSIS — I2510 Atherosclerotic heart disease of native coronary artery without angina pectoris: Secondary | ICD-10-CM | POA: Diagnosis not present

## 2014-12-21 NOTE — Anesthesia Preprocedure Evaluation (Addendum)
Anesthesia Evaluation  Patient identified by MRN, date of birth, ID band Patient awake    Reviewed: Allergy & Precautions, H&P , NPO status , Patient's Chart, lab work & pertinent test results  Airway Mallampati: II  TM Distance: >3 FB Neck ROM: Full    Dental  (+) Missing, Dental Advisory Given, Chipped, Partial Upper,    Pulmonary neg pulmonary ROS, former smoker,    Pulmonary exam normal breath sounds clear to auscultation       Cardiovascular hypertension, Pt. on medications + CAD, + Past MI, + CABG and +CHF  Cardiac stents: 1989, 1997.  Normal cardiovascular exam Rhythm:Regular Rate:Normal  CABG 1989 with redo 1997, last saw cardiologist 12/18/14 prior to surgery and he needs no further cardiac work up, he does not tolerate ACE-I or BB due to bradycardia and hypotension. Ischemic cardiomyopathy ef 35-40% per cath 08-28-2013 Cardiac cath 08/28/13 with 95% left main stenosis, occluded LAD, occluded Circumflex, occluded RCA. He does have patent LIMA to LAD, patent SVG to OM supplying collaterals to RCA. Occluded SVG to RCA and SVG to intermediate branch.   Neuro/Psych negative neurological ROS  negative psych ROS   GI/Hepatic Neg liver ROS, GERD  Medicated and Controlled,  Endo/Other  negative endocrine ROS  Renal/GU negative Renal ROS  negative genitourinary   Musculoskeletal negative musculoskeletal ROS (+)   Abdominal   Peds negative pediatric ROS (+)  Hematology negative hematology ROS (+)   Anesthesia Other Findings   Reproductive/Obstetrics negative OB ROS                            Anesthesia Physical  Anesthesia Plan  ASA: III  Anesthesia Plan: General   Post-op Pain Management:    Induction: Intravenous  Airway Management Planned: LMA  Additional Equipment:   Intra-op Plan:   Post-operative Plan: Extubation in OR  Informed Consent: I have reviewed the patients  History and Physical, chart, labs and discussed the procedure including the risks, benefits and alternatives for the proposed anesthesia with the patient or authorized representative who has indicated his/her understanding and acceptance.   Dental advisory given  Plan Discussed with: CRNA  Anesthesia Plan Comments: (Will use phenylephrine to maintain coronary perfusion pressure at baseline, goal HR 60-80 He has been off aspirin for 4-5 days as required by urology/cardiology given surgical risk of bleeding complication higher than coronary complication risk)       Anesthesia Quick Evaluation

## 2014-12-22 ENCOUNTER — Ambulatory Visit (HOSPITAL_COMMUNITY): Payer: Medicare Other | Admitting: Anesthesiology

## 2014-12-22 ENCOUNTER — Ambulatory Visit (HOSPITAL_COMMUNITY)
Admission: RE | Admit: 2014-12-22 | Discharge: 2014-12-22 | Disposition: A | Discharge status: Home / Self Care | Payer: Medicare Other | Source: Ambulatory Visit | Attending: Urology | Admitting: Urology

## 2014-12-22 ENCOUNTER — Encounter (HOSPITAL_COMMUNITY): Payer: Self-pay | Admitting: *Deleted

## 2014-12-22 ENCOUNTER — Encounter (HOSPITAL_COMMUNITY)
Admission: RE | Disposition: A | Discharge status: Home / Self Care | Payer: Self-pay | Source: Ambulatory Visit | Attending: Urology

## 2014-12-22 DIAGNOSIS — I255 Ischemic cardiomyopathy: Secondary | ICD-10-CM | POA: Insufficient documentation

## 2014-12-22 DIAGNOSIS — Z951 Presence of aortocoronary bypass graft: Secondary | ICD-10-CM | POA: Diagnosis not present

## 2014-12-22 DIAGNOSIS — Z87442 Personal history of urinary calculi: Secondary | ICD-10-CM | POA: Diagnosis not present

## 2014-12-22 DIAGNOSIS — E785 Hyperlipidemia, unspecified: Secondary | ICD-10-CM | POA: Diagnosis not present

## 2014-12-22 DIAGNOSIS — N359 Urethral stricture, unspecified: Secondary | ICD-10-CM | POA: Insufficient documentation

## 2014-12-22 DIAGNOSIS — Z79899 Other long term (current) drug therapy: Secondary | ICD-10-CM | POA: Insufficient documentation

## 2014-12-22 DIAGNOSIS — N35011 Post-traumatic bulbous urethral stricture: Secondary | ICD-10-CM

## 2014-12-22 DIAGNOSIS — H9193 Unspecified hearing loss, bilateral: Secondary | ICD-10-CM | POA: Insufficient documentation

## 2014-12-22 DIAGNOSIS — Z87891 Personal history of nicotine dependence: Secondary | ICD-10-CM | POA: Diagnosis not present

## 2014-12-22 DIAGNOSIS — Z8546 Personal history of malignant neoplasm of prostate: Secondary | ICD-10-CM | POA: Diagnosis not present

## 2014-12-22 DIAGNOSIS — F0390 Unspecified dementia without behavioral disturbance: Secondary | ICD-10-CM | POA: Diagnosis not present

## 2014-12-22 DIAGNOSIS — I10 Essential (primary) hypertension: Secondary | ICD-10-CM | POA: Diagnosis not present

## 2014-12-22 DIAGNOSIS — Z79891 Long term (current) use of opiate analgesic: Secondary | ICD-10-CM | POA: Insufficient documentation

## 2014-12-22 DIAGNOSIS — N320 Bladder-neck obstruction: Secondary | ICD-10-CM | POA: Insufficient documentation

## 2014-12-22 DIAGNOSIS — R339 Retention of urine, unspecified: Secondary | ICD-10-CM | POA: Insufficient documentation

## 2014-12-22 DIAGNOSIS — Z7982 Long term (current) use of aspirin: Secondary | ICD-10-CM | POA: Insufficient documentation

## 2014-12-22 DIAGNOSIS — I509 Heart failure, unspecified: Secondary | ICD-10-CM | POA: Insufficient documentation

## 2014-12-22 DIAGNOSIS — M1990 Unspecified osteoarthritis, unspecified site: Secondary | ICD-10-CM | POA: Insufficient documentation

## 2014-12-22 DIAGNOSIS — K219 Gastro-esophageal reflux disease without esophagitis: Secondary | ICD-10-CM | POA: Insufficient documentation

## 2014-12-22 DIAGNOSIS — I252 Old myocardial infarction: Secondary | ICD-10-CM | POA: Diagnosis not present

## 2014-12-22 DIAGNOSIS — Z923 Personal history of irradiation: Secondary | ICD-10-CM | POA: Diagnosis not present

## 2014-12-22 DIAGNOSIS — I2510 Atherosclerotic heart disease of native coronary artery without angina pectoris: Secondary | ICD-10-CM | POA: Diagnosis not present

## 2014-12-22 DIAGNOSIS — I200 Unstable angina: Secondary | ICD-10-CM | POA: Diagnosis not present

## 2014-12-22 DIAGNOSIS — N210 Calculus in bladder: Secondary | ICD-10-CM | POA: Diagnosis not present

## 2014-12-22 HISTORY — PX: INSERTION OF SUPRAPUBIC CATHETER: SHX5870

## 2014-12-22 HISTORY — PX: CYSTOSCOPY WITH URETHRAL DILATATION: SHX5125

## 2014-12-22 SURGERY — CYSTOSCOPY, WITH URETHRAL DILATION
Anesthesia: General

## 2014-12-22 MED ORDER — LACTATED RINGERS IV SOLN: INTRAVENOUS | Status: DC

## 2014-12-22 MED ORDER — DEXTROSE 5 % IV SOLN
INTRAVENOUS | Status: AC
  Filled 2014-12-22: qty 2

## 2014-12-22 MED ORDER — DEXAMETHASONE SODIUM PHOSPHATE 10 MG/ML IJ SOLN
INTRAMUSCULAR | Status: AC
  Filled 2014-12-22: qty 1

## 2014-12-22 MED ORDER — FENTANYL CITRATE (PF) 100 MCG/2ML IJ SOLN
25.0000 ug | INTRAMUSCULAR | Status: DC | PRN
  Administered 2014-12-22: 50 ug via INTRAVENOUS
  Administered 2014-12-22: 25 ug via INTRAVENOUS

## 2014-12-22 MED ORDER — IOHEXOL 300 MG/ML  SOLN
INTRAMUSCULAR | Status: DC | PRN
  Administered 2014-12-22: 5 mL

## 2014-12-22 MED ORDER — STERILE WATER FOR IRRIGATION IR SOLN
Status: DC | PRN
  Administered 2014-12-22: 3000 mL

## 2014-12-22 MED ORDER — FENTANYL CITRATE (PF) 100 MCG/2ML IJ SOLN
INTRAMUSCULAR | Status: DC | PRN
  Administered 2014-12-22: 25 ug via INTRAVENOUS
  Administered 2014-12-22: 50 ug via INTRAVENOUS
  Administered 2014-12-22: 25 ug via INTRAVENOUS

## 2014-12-22 MED ORDER — FENTANYL CITRATE (PF) 100 MCG/2ML IJ SOLN
INTRAMUSCULAR | Status: AC
  Filled 2014-12-22: qty 2

## 2014-12-22 MED ORDER — EPHEDRINE SULFATE 50 MG/ML IJ SOLN
INTRAMUSCULAR | Status: DC | PRN
  Administered 2014-12-22 (×2): 5 mg via INTRAVENOUS

## 2014-12-22 MED ORDER — ONDANSETRON HCL 4 MG/2ML IJ SOLN
INTRAMUSCULAR | Status: DC | PRN
  Administered 2014-12-22: 4 mg via INTRAVENOUS

## 2014-12-22 MED ORDER — ONDANSETRON HCL 4 MG/2ML IJ SOLN: 4.0000 mg | Freq: Once | INTRAMUSCULAR | Status: DC | PRN

## 2014-12-22 MED ORDER — PROPOFOL 10 MG/ML IV BOLUS
INTRAVENOUS | Status: DC | PRN
  Administered 2014-12-22: 80 mg via INTRAVENOUS

## 2014-12-22 MED ORDER — FENTANYL CITRATE (PF) 100 MCG/2ML IJ SOLN
INTRAMUSCULAR | Status: AC
  Filled 2014-12-22: qty 4

## 2014-12-22 MED ORDER — LIDOCAINE HCL (CARDIAC) 20 MG/ML IV SOLN
INTRAVENOUS | Status: AC
  Filled 2014-12-22: qty 5

## 2014-12-22 MED ORDER — DEXTROSE 5 % IV SOLN
2.0000 g | INTRAVENOUS | Status: AC
  Administered 2014-12-22: 2 g via INTRAVENOUS

## 2014-12-22 MED ORDER — ONDANSETRON HCL 4 MG/2ML IJ SOLN
INTRAMUSCULAR | Status: AC
  Filled 2014-12-22: qty 2

## 2014-12-22 MED ORDER — DEXAMETHASONE SODIUM PHOSPHATE 10 MG/ML IJ SOLN
INTRAMUSCULAR | Status: DC | PRN
  Administered 2014-12-22: 10 mg via INTRAVENOUS

## 2014-12-22 MED ORDER — PROPOFOL 10 MG/ML IV BOLUS
INTRAVENOUS | Status: AC
  Filled 2014-12-22: qty 20

## 2014-12-22 MED ORDER — 0.9 % SODIUM CHLORIDE (POUR BTL) OPTIME
TOPICAL | Status: DC | PRN
  Administered 2014-12-22: 1000 mL

## 2014-12-22 MED ORDER — LIDOCAINE HCL (CARDIAC) 20 MG/ML IV SOLN
INTRAVENOUS | Status: DC | PRN
  Administered 2014-12-22: 80 mg via INTRAVENOUS

## 2014-12-22 MED ORDER — STERILE WATER FOR IRRIGATION IR SOLN
Status: DC | PRN
  Administered 2014-12-22: 1000 mL

## 2014-12-22 MED ORDER — LACTATED RINGERS IV SOLN
INTRAVENOUS | Status: DC | PRN
  Administered 2014-12-22: 07:00:00 via INTRAVENOUS

## 2014-12-22 SURGICAL SUPPLY — 31 items
BAG URINE DRAINAGE (UROLOGICAL SUPPLIES) ×1 IMPLANT
BAG URINE LEG 500ML (DRAIN) ×1 IMPLANT
BALLN NEPHROSTOMY (BALLOONS) ×2
BALLOON NEPHROSTOMY (BALLOONS) IMPLANT
BLADE SURG 15 STRL LF DISP TIS (BLADE) ×1 IMPLANT
BLADE SURG 15 STRL SS (BLADE)
CATH FOLEY 2WAY SLVR  5CC 16FR (CATHETERS) ×1
CATH FOLEY 2WAY SLVR 5CC 16FR (CATHETERS) IMPLANT
CATH FOLEY INTRO SUPRA 16F (CATHETERS) ×1 IMPLANT
COVER SURGICAL LIGHT HANDLE (MISCELLANEOUS) ×2 IMPLANT
ELECT PENCIL ROCKER SW 15FT (MISCELLANEOUS) IMPLANT
ELECT REM PT RETURN 9FT ADLT (ELECTROSURGICAL)
ELECTRODE REM PT RTRN 9FT ADLT (ELECTROSURGICAL) ×1 IMPLANT
GAUZE SPONGE 4X4 12PLY STRL (GAUZE/BANDAGES/DRESSINGS) ×1 IMPLANT
GLOVE BIO SURGEON STRL SZ7.5 (GLOVE) ×2 IMPLANT
GOWN STRL REUS W/ TWL LRG LVL3 (GOWN DISPOSABLE) IMPLANT
GOWN STRL REUS W/ TWL XL LVL3 (GOWN DISPOSABLE) IMPLANT
GOWN STRL REUS W/TWL LRG LVL3 (GOWN DISPOSABLE) ×4
GOWN STRL REUS W/TWL XL LVL3 (GOWN DISPOSABLE) ×4 IMPLANT
GUIDEWIRE STR DUAL SENSOR (WIRE) ×1 IMPLANT
KIT SUPRAPUBIC CATH (MISCELLANEOUS) ×1 IMPLANT
MANIFOLD NEPTUNE II (INSTRUMENTS) ×2 IMPLANT
NEEDLE HYPO 22GX1.5 SAFETY (NEEDLE) IMPLANT
NEEDLE SPNL 22GX7 SPINOC (NEEDLE) ×1 IMPLANT
NS IRRIG 1000ML POUR BTL (IV SOLUTION) ×2 IMPLANT
PACK CYSTO (CUSTOM PROCEDURE TRAY) ×2 IMPLANT
PLUG CATH AND CAP STER (CATHETERS) IMPLANT
SUT ETHILON 3 0 FSL (SUTURE) ×1 IMPLANT
TAPE CLOTH SURG 4X10 WHT LF (GAUZE/BANDAGES/DRESSINGS) ×1 IMPLANT
TOWEL OR 17X26 10 PK STRL BLUE (TOWEL DISPOSABLE) ×1 IMPLANT
WATER STERILE IRR 3000ML UROMA (IV SOLUTION) ×2 IMPLANT

## 2014-12-22 NOTE — Discharge Instructions (Signed)
Cystoscopy patient instructions  Following a cystoscopy, a catheter (a flexible rubber tube) is sometimes left in place to empty the bladder. This may cause some discomfort or a feeling that you need to urinate. Your doctor determines the period of time that the catheter will be left in place. You may have bloody urine for two to three days (Call your doctor if the amount of bleeding increases or does not subside).  You may pass blood clots in your urine, especially if you had a biopsy. It is not unusual to pass small blood clots and have some bloody urine a couple of weeks after your cystoscopy. Again, call your doctor if the bleeding does not subside. You may have: Dysuria (painful urination) Frequency (urinating often) Urgency (strong desire to urinate)  These symptoms are common especially if medicine is instilled into the bladder or a ureteral stent is placed. Avoiding alcohol and caffeine, such as coffee, tea, and chocolate, may help relieve these symptoms. Drink plenty of water, unless otherwise instructed. Your doctor may also prescribe an antibiotic or other medicine to reduce these symptoms.  Cystoscopy results are available soon after the procedure; biopsy results usually take two to four days. Your doctor will discuss the results of your exam with you. Before you go home, you will be given specific instructions for follow-up care. Special Instructions:  1 If you are going home with a catheter in place do not take a tub bath until removed by your doctor.  2 You may resume your normal activities.  3 Do not drive or operate machinery if you are taking narcotic pain medicine.  4 Be sure to keep all follow-up appointments with your doctor.   5 Call Your Doctor If: The catheter is not draining  You have severe pain  You are unable to urinate  You have a fever over 101  You have severe bleeding         You can leave your suprapubic tube plugged. If you're unable to void or have  difficulty voiding you can remove the plug from the suprapubic tube and drain your bladder temporarily. You can then replug the tube and continue that process as needed.

## 2014-12-22 NOTE — H&P (Signed)
Ronald [name] is an 81 y.o. male.   Chief Complaint: Recurrent urethral stricture disease with incomplete emptying HPI: Remote history of prostate cancer treated with radiation. The patient now has chronic bladder neck contracture and urethral stricture disease with stone formation. He presents for cystoscopy with dilation of his urethral stricture and placement of a suprapubic tube  Past Medical History  Diagnosis Date  . Hyperlipidemia   . [name]/P CABG (coronary artery bypass graft)     [place]  . History of Bell'[name] palsy     RIGHT SIDE-- NO RESIDUAL  . OAB (overactive bladder)   . Chronic cystitis   . Ischemic cardiomyopathy     ef 35-40% per cath 08-28-2013  . Coronary artery disease CARDIOLOGIST-  DR Grace Bushy  MI -- [name]/P  11/89 CABG x6 (70% circ; 90% PD; 60-70% distal left main; 30% left circ; 90% 1st diag;, 2nd diag and 3rd diag. w/90%,  mild stenosis LAD and 60-70% pLAD)  Re-do CABG x5 in 1997  . Wears hearing aid     bilateral  . Wears partial dentures   . At risk for sleep apnea     STOP-BANG= 4    SENT TO PCP 12-23-2013  . GERD (gastroesophageal reflux disease)   . Mild dementia   . Kidney stones "years ago"    "passed them"  . Hypertension     "not anymore" (01/02/2014)  . Myocardial infarction 1986; 1997  . Arthritis     "joints ache" (01/02/2014)  . CHF (congestive heart failure)   . Skipped heart beats     occasional  . Hx of dizziness   . Diverticulosis   . Urethral stricture   . Bladder calculi   . Nocturia   . Prostate cancer 1998    [name]/P  RADIACTIVE SEED IMPLANTS; UROLOGIST-  DR Kendale Rembold  . Melanoma of ear     "right"    Past Surgical History  Procedure Laterality Date  . Eus  10/05/2011    Procedure: ESOPHAGEAL ENDOSCOPIC ULTRASOUND (EUS) RADIAL;  Surgeon: Arta Silence, MD;  Location: [facility];  Service: Endoscopy;  Laterality: N/A;  . Tonsillectomy and adenoidectomy  1954  . Laparoscopic cholecystectomy  12-23-2005  . Radioactive  prostate seed implants  1998  . Cardiovascular stress test  08-01-2011  dr Angelena Form    inferior scar and possible soft tissue attenuation with minimal peri-infarct ischemia, small region of anterior ischemia and scar/  LVEF 53% LV wall motion with inferior hypokinesis/ no significant change from scan july 2011  . Cataract extraction w/ intraocular lens  implant, bilateral Bilateral 2007  . Cystoscopy with urethral dilatation N/A 12/25/2013    Procedure: CYSTOSCOPY WITH URETHRAL DILATATION, WITH BIOPSY;  Surgeon: Bernestine Amass, MD;  Location: [facility];  Service: Urology;  Laterality: N/A;  . Melanoma excision  X 1    "ear"  . Cardiac catheterization  02-22-2005  dr Vidal Schwalbe    mild to moderate lv dysfunction with inferobasilar akinesis/  totally occluded SVG to Intermediate Diagonal and SVG to PDA and RCA branches, totally occluded native coronary circulation with diffuse disease pLAD diagonal system with potentially could be ischemic/  patent SVG to OM with collaterals to dRCA and patent LIMA to LAD and diagonal systemss  . Cardiac catheterization  08-28-2013  DR Daneen Schick    widely patent sequential left internal graft to the diagonal/LAD, widely patent SVG to OM with proximal 50% narrowing  noted in the graft, total occlusion SVG'[name] to RCA, RI, and the Diagonal/ LV dysfunction with inferobasal aneurysm and mid anterior wall region of akinesis/  overall EF 35-40%/  Total occlusion of the navtive circulation/  no significant change compared to 2006 cath  . Skin cancer excision  X 2    "top of head"  . Left heart catheterization with coronary/graft angiogram N/A 08/28/2013    Procedure: LEFT HEART CATHETERIZATION WITH Beatrix Fetters;  Surgeon: Sinclair Grooms, MD;  Location: [facility];  Service: Cardiovascular;  Laterality: N/A;  . Coronary artery bypass graft  11/ 1989  &  10/ 1997    1989-- 6 vessel/  1997 Re-do 5 vessel    Family History  Problem Relation Age of  Onset  . Heart disease Mother   . Diabetes Father   . Heart disease Brother    Social History:  reports that he quit smoking about 31 years ago. His smoking use included Cigars. He quit smokeless tobacco use about 15 months ago. His smokeless tobacco use included Chew. He reports that he does not drink alcohol or use illicit drugs.  Allergies:  Allergies  Allergen Reactions  . Pantoprazole Other (See Comments)    Headache and lightheaded  . Nitrofurantoin Hives  . Sulfa Antibiotics Hives and Itching    Medications Prior to Admission  Medication Sig Dispense Refill  . acetaminophen (TYLENOL) 500 MG tablet Take 500 mg by mouth every 6 (six) hours as needed for pain or fever.     Marland Kitchen aspirin EC 81 MG EC tablet Take 1 tablet (81 mg total) by mouth daily. 30 tablet 0  . atorvastatin (LIPITOR) 80 MG tablet Take 40 mg by mouth every evening.    . calcium carbonate (TUMS - DOSED IN MG ELEMENTAL CALCIUM) 500 MG chewable tablet Chew 2 tablets by mouth as needed for indigestion or heartburn.    . Calcium Carbonate-Vitamin D (CALCIUM + D PO) Take 1 tablet by mouth 2 (two) times daily.     . Cholecalciferol (VITAMIN D) 2000 UNITS tablet Take 2,000 Units by mouth daily.    . ciprofloxacin (CIPRO) 500 MG tablet Take 1 tablet (500 mg total) by mouth 2 (two) times daily. (Patient taking differently: Take 500 mg by mouth 2 (two) times daily. For 2 weeks 12-07-14 to 12-21-14) 28 tablet 0  . flavoxATE (URISPAS) 100 MG tablet Take 1 tablet (100 mg total) by mouth 3 (three) times daily as needed for bladder spasms. 60 tablet 3  . galantamine (RAZADYNE ER) 8 MG 24 hr capsule Take 8 mg by mouth daily with breakfast.    . HYDROcodone-acetaminophen (NORCO/VICODIN) 5-325 MG per tablet Take 1 tablet by mouth every 6 (six) hours as needed for moderate pain. 30 tablet 0  . hydroxypropyl methylcellulose (ISOPTO TEARS) 2.5 % ophthalmic solution Place 1 drop into both eyes 2 (two) times daily.     . isosorbide mononitrate  (IMDUR) 60 MG 24 hr tablet Take 0.5 tablets (30 mg total) by mouth every morning.    . Multiple Vitamin (MULTIVITAMIN WITH MINERALS) TABS Take 1 tablet by mouth daily.    . silodosin (RAPAFLO) 8 MG CAPS capsule Take 8 mg by mouth every evening.     . nitroGLYCERIN (NITROSTAT) 0.4 MG SL tablet Place 0.4 mg under the tongue every 5 (five) minutes as needed for chest pain.     . ranitidine (ZANTAC) 150 MG tablet Take 150 mg by mouth as needed for heartburn.  No results found for this or any previous visit (from the past 48 hour([name])). No results found.  Review of Systems - Negative except fatigue, slowing of urinary stream, nocturia, frequency, urgency, mild dysuria  Blood pressure 150/62, pulse 51, temperature 97.3 F (36.3 C), temperature source Oral, resp. rate 18, height 5\' 5"  (1.651 m), weight 70.943 kg (156 lb 6.4 oz), SpO2 100 %. General appearance: alert, cooperative and no distress Neck: no adenopathy and no JVD Resp: clear to auscultation bilaterally Cardio: regular rate and rhythm GI: soft, non-tender; bowel sounds normal; no masses,  no organomegaly Male genitalia: normal, penis: no lesions or discharge. testes: no masses or tenderness. no hernias Pelvic: external genitalia normal Extremities: extremities normal, atraumatic, no cyanosis or edema Skin: Skin color, texture, turgor normal. No rashes or lesions Neurologic: Grossly normal  Assessment/Plan Urethral stricture disease and bladder neck contracture and stone formation. Patient presents now for cystoscopy with placement of a suprapubic tube along with urethral dilation and possible treatment of bladder/urethral calculi  Ronald [name] 12/22/2014, 7:33 AM

## 2014-12-22 NOTE — Transfer of Care (Signed)
Immediate Anesthesia Transfer of Care Note  Patient: Ronald [name]  Procedure(s) Performed: Procedure(s) with comments: CYSTOSCOPY WITH URETHRAL DILATATION (N/A) - BALLOON DILATION CATHETER   INSERTION OF SUPRAPUBIC CATHETER  (N/A)  Patient Location: PACU  Anesthesia Type:General  Level of Consciousness:  sedated, patient cooperative and responds to stimulation  Airway & Oxygen Therapy:Patient Spontanous Breathing and Patient connected to face mask oxgen  Post-op Assessment:  Report given to PACU RN and Post -op Vital signs reviewed and stable  Post vital signs:  Reviewed and stable  Last Vitals:  Filed Vitals:   12/22/14 0541  BP: 150/62  Pulse: 51  Temp: 36.3 C  Resp: 18    Complications: No apparent anesthesia complications

## 2014-12-22 NOTE — Op Note (Signed)
Preoperative diagnosis: Remote history of prostate cancer status post treatment with radiation therapy, urethral stricture disease, bladder neck contracture, incomplete bladder emptying Postoperative diagnosis: Same  Procedure: Cystoscopy with balloon dilation of urethral stricture and bladder neck contracture, percutaneous placement of suprapubic tube-16 French   Surgeon: Bernestine Amass M.D.  Anesthesia: Gen.  Indications: Remote history of prostate cancer tissue with radiation. Ronald [name] is had problematic issues with recurrent urethral stricture disease and bladder neck contracture. He presents now for repeat urethral dilation with placement of the suprapubic tube as a long-term strategy as his voiding is likely to continue to worsen     Technique and findings: Patient was brought the operating room where he had successful induction general anesthesia. He was placed in lithotomy position and prepped and draped in usual manner. He received perioperative antibody X and placement of PAS compression boots. Appropriate surgical timeout was performed. Cystoscopy revealed unremarkable anterior urethra. Here the bulbar urethra there was substantial narrowing I were unable to order advance the cystoscope. Guidewire was placed through this area with fluoroscopic guidance into the bladder. Over the guidewire we placed a 24 French fascial dilating balloon which was inflated to 16 atm for 5 minutes. Cystoscopy then showed some necrotic material within the remnants of his prostatic fossa. There was evidence of mild bladder neck contracture. With the cystoscope in place I was able to fill the bladder and then place a spinal needle at the dome of the bladder. The spinal needle was then removed and a trocar was inserted with direct visual guidance. Through the trocar a 17 French suprapubic tube was placed and good positioning was confirmed. That was secured to the patient's abdominal wall. He was brought to PACU in  stable condition.

## 2014-12-22 NOTE — Anesthesia Procedure Notes (Signed)
Procedure Name: LMA Insertion Date/Time: 12/22/2014 7:42 AM Performed by: Montel Clock Pre-anesthesia Checklist: Patient identified, Emergency Drugs available, Suction available, Patient being monitored and Timeout performed Patient Re-evaluated:Patient Re-evaluated prior to inductionOxygen Delivery Method: Circle system utilized Preoxygenation: Pre-oxygenation with 100% oxygen Intubation Type: IV induction Ventilation: Mask ventilation without difficulty LMA: LMA with gastric port inserted LMA Size: 4.0 Number of attempts: 1 Dental Injury: Teeth and Oropharynx as per pre-operative assessment  Comments: Initially poor seal/felt occluded, LMA pulled back slightly to achieve adequate seal.

## 2014-12-22 NOTE — Progress Notes (Signed)
Band-aid over stated abrasion rt lower arm, upon admission to short stay

## 2014-12-22 NOTE — Anesthesia Postprocedure Evaluation (Signed)
  Anesthesia Post-op Note  Patient: Ronald [name]  Procedure(s) Performed: Procedure(s) (LRB): CYSTOSCOPY WITH URETHRAL DILATATION (N/A) INSERTION OF SUPRAPUBIC CATHETER  (N/A)  Patient Location: PACU  Anesthesia Type: General  Level of Consciousness: awake and alert   Airway and Oxygen Therapy: Patient Spontanous Breathing  Post-op Pain: mild  Post-op Assessment: Post-op Vital signs reviewed, Patient's Cardiovascular Status Stable, Respiratory Function Stable, Patent Airway and No signs of Nausea or vomiting  Last Vitals:  Filed Vitals:   12/22/14 0827  BP: 144/62  Pulse: 58  Temp: 36.6 C  Resp: 15    Post-op Vital Signs: stable   Complications: No apparent anesthesia complications

## 2014-12-24 DIAGNOSIS — A419 Sepsis, unspecified organism: Secondary | ICD-10-CM | POA: Diagnosis not present

## 2015-01-05 DIAGNOSIS — N3281 Overactive bladder: Secondary | ICD-10-CM | POA: Diagnosis not present

## 2015-01-05 DIAGNOSIS — N359 Urethral stricture, unspecified: Secondary | ICD-10-CM | POA: Diagnosis not present

## 2015-01-05 DIAGNOSIS — N390 Urinary tract infection, site not specified: Secondary | ICD-10-CM | POA: Diagnosis not present

## 2015-01-12 DIAGNOSIS — N3020 Other chronic cystitis without hematuria: Secondary | ICD-10-CM | POA: Diagnosis not present

## 2015-01-12 DIAGNOSIS — R310 Gross hematuria: Secondary | ICD-10-CM | POA: Diagnosis not present

## 2015-01-12 DIAGNOSIS — N359 Urethral stricture, unspecified: Secondary | ICD-10-CM | POA: Diagnosis not present

## 2015-01-21 DIAGNOSIS — N3281 Overactive bladder: Secondary | ICD-10-CM | POA: Diagnosis not present

## 2015-01-21 DIAGNOSIS — N3020 Other chronic cystitis without hematuria: Secondary | ICD-10-CM | POA: Diagnosis not present

## 2015-02-09 DIAGNOSIS — N3281 Overactive bladder: Secondary | ICD-10-CM | POA: Diagnosis not present

## 2015-02-09 DIAGNOSIS — N390 Urinary tract infection, site not specified: Secondary | ICD-10-CM | POA: Diagnosis not present

## 2015-02-09 DIAGNOSIS — M62838 Other muscle spasm: Secondary | ICD-10-CM | POA: Diagnosis not present

## 2015-02-17 DIAGNOSIS — R8271 Bacteriuria: Secondary | ICD-10-CM | POA: Diagnosis not present

## 2015-02-17 DIAGNOSIS — N3020 Other chronic cystitis without hematuria: Secondary | ICD-10-CM | POA: Diagnosis not present

## 2015-02-17 DIAGNOSIS — N3281 Overactive bladder: Secondary | ICD-10-CM | POA: Diagnosis not present

## 2015-03-17 DIAGNOSIS — N3281 Overactive bladder: Secondary | ICD-10-CM | POA: Diagnosis not present

## 2015-03-25 ENCOUNTER — Encounter: Payer: Self-pay | Admitting: *Deleted

## 2015-04-14 DIAGNOSIS — N3281 Overactive bladder: Secondary | ICD-10-CM | POA: Diagnosis not present

## 2015-05-12 DIAGNOSIS — N3281 Overactive bladder: Secondary | ICD-10-CM | POA: Diagnosis not present

## 2015-06-09 DIAGNOSIS — N3281 Overactive bladder: Secondary | ICD-10-CM | POA: Diagnosis not present

## 2015-07-07 DIAGNOSIS — N3281 Overactive bladder: Secondary | ICD-10-CM | POA: Diagnosis not present

## 2015-07-17 ENCOUNTER — Ambulatory Visit (INDEPENDENT_AMBULATORY_CARE_PROVIDER_SITE_OTHER): Payer: Medicare Other | Admitting: Cardiovascular Disease

## 2015-07-17 VITALS — BP 110/62 | HR 52

## 2015-07-17 DIAGNOSIS — I255 Ischemic cardiomyopathy: Secondary | ICD-10-CM | POA: Diagnosis not present

## 2015-07-17 DIAGNOSIS — I25810 Atherosclerosis of coronary artery bypass graft(s) without angina pectoris: Secondary | ICD-10-CM | POA: Diagnosis not present

## 2015-07-17 DIAGNOSIS — I10 Essential (primary) hypertension: Secondary | ICD-10-CM

## 2015-07-17 DIAGNOSIS — E785 Hyperlipidemia, unspecified: Secondary | ICD-10-CM

## 2015-07-17 NOTE — Patient Instructions (Signed)
Medication Instructions:  Your physician recommends that you continue on your current medications as directed. Please refer to the Current Medication list given to you today.   Labwork: Your physician recommends that you return for fasting lab work  On April 27,2017.  The lab opens at 7:30 AM   Testing/Procedures: none  Follow-Up: Your physician wants you to follow-up in: 6 months.  You will receive a reminder letter in the mail two months in advance. If you don't receive a letter, please call our office to schedule the follow-up appointment.   Any Other Special Instructions Will Be Listed Below (If Applicable).     If you need a refill on your cardiac medications before your next appointment, please call your pharmacy.

## 2015-07-17 NOTE — Progress Notes (Signed)
Chief Complaint  Patient presents with  . Follow-up     History of Present Illness: 82 yo male with history of CAD s/p CABG in 1989 and redo bypass in 1997 per Dr. Redmond Pulling, nephrolithiasis, HTN, HLD, mild dementia, prostate cancer here today for cardiac follow up. He has been followed in the past by Dr. Doreatha Lew. Leane Call May 2013 because of dyspnea and fatigue. This showed inferior scar with soft tissue attenuation and small region of anterior scar with possible small area of ischemia. This was felt to be low risk. He was seen in November 2014 and c/o fatigue and was hypotensive. Lisinopril was stopped and this resolved. Admitted to [facility] June 2015 with chest pain. Cardiac cath 08/28/13 with 95% left main stenosis, occluded LAD, occluded Circumflex, occluded RCA. Patent LIMA to LAD, patent SVG to OM supplying collaterals to RCA. Occluded SVG to RCA and SVG to intermediate branch. Medical therapy recommended. LVEF=35-40% by LVgram. His pain resolved with addition of Zantac, felt to be GI related. He was admitted to [facility] October 2015 with dilatation of urethral stricture and lithotripsy of bladder stones and readmitted with UTI and confusion. Most recently admitted to [facility] 12/05/14 with urosepsis.   He is here today for follow up. He has no exertional chest pain or pressure. No SOB or  dizziness. No LE edema. Weight is stable.   Primary Care Physician: Dr. Burnice Logan  Past Medical History  Diagnosis Date  . Hyperlipidemia   . S/P CABG (coronary artery bypass graft)     [place]  . History of Bell's palsy     RIGHT SIDE-- NO RESIDUAL  . OAB (overactive bladder)   . Chronic cystitis   . Ischemic cardiomyopathy     ef 35-40% per cath 08-28-2013  . Coronary artery disease CARDIOLOGIST-  DR Grace Bushy  [place] -- S/P  11/89 CABG x6 (70% circ; 90% PD; 60-70% distal left main; 30% left circ; 90% 1st diag;, 2nd diag and 3rd diag. w/90%,  mild stenosis LAD and 60-70%  pLAD)  Re-do CABG x5 in 1997  . Wears hearing aid     bilateral  . Wears partial dentures   . At risk for sleep apnea     STOP-BANG= 4    SENT TO PCP 12-23-2013  . GERD (gastroesophageal reflux disease)   . Mild dementia   . Kidney stones "years ago"    "passed them"  . Hypertension     "not anymore" (01/02/2014)  . Myocardial infarction ([place]) 1986; 1997  . Arthritis     "joints ache" (01/02/2014)  . CHF (congestive heart failure)   . Skipped heart beats     occasional  . Hx of dizziness   . Diverticulosis   . Urethral stricture   . Bladder calculi   . Nocturia   . Prostate cancer 1998    S/P  RADIACTIVE SEED IMPLANTS; UROLOGIST-  DR GRAPEY  . Melanoma of ear     "right"    Past Surgical History  Procedure Laterality Date  . Eus  10/05/2011    Procedure: ESOPHAGEAL ENDOSCOPIC ULTRASOUND (EUS) RADIAL;  Surgeon: Arta Silence, MD;  Location: [facility];  Service: Endoscopy;  Laterality: N/A;  . Tonsillectomy and adenoidectomy  1954  . Laparoscopic cholecystectomy  12-23-2005  . Radioactive prostate seed implants  1998  . Cardiovascular stress test  08-01-2011  dr Angelena Form    inferior scar and possible soft  tissue attenuation with minimal peri-infarct ischemia, small region of anterior ischemia and scar/  LVEF 53% LV wall motion with inferior hypokinesis/ no significant change from scan july 2011  . Cataract extraction w/ intraocular lens  implant, bilateral Bilateral 2007  . Cystoscopy with urethral dilatation N/A 12/25/2013    Procedure: CYSTOSCOPY WITH URETHRAL DILATATION, WITH BIOPSY;  Surgeon: Bernestine Amass, MD;  Location: [facility];  Service: Urology;  Laterality: N/A;  . Melanoma excision  X 1    "ear"  . Cardiac catheterization  02-22-2005  dr Vidal Schwalbe    mild to moderate lv dysfunction with inferobasilar akinesis/  totally occluded SVG to Intermediate Diagonal and SVG to PDA and RCA branches, totally occluded native coronary circulation with diffuse  disease pLAD diagonal system with potentially could be ischemic/  patent SVG to OM with collaterals to dRCA and patent LIMA to LAD and diagonal systemss  . Cardiac catheterization  08-28-2013  DR Daneen Schick    widely patent sequential left internal graft to the diagonal/LAD, widely patent SVG to OM with proximal 50% narrowing noted in the graft, total occlusion SVG's to RCA, RI, and the Diagonal/ LV dysfunction with inferobasal aneurysm and mid anterior wall region of akinesis/  overall EF 35-40%/  Total occlusion of the navtive circulation/  no significant change compared to 2006 cath  . Skin cancer excision  X 2    "top of head"  . Left heart catheterization with coronary/graft angiogram N/A 08/28/2013    Procedure: LEFT HEART CATHETERIZATION WITH Beatrix Fetters;  Surgeon: Sinclair Grooms, MD;  Location: [facility] CATH LAB;  Service: Cardiovascular;  Laterality: N/A;  . Coronary artery bypass graft  11/ 1989  &  10/ 1997    1989-- 6 vessel/  1997 Re-do 5 vessel  . Cystoscopy with urethral dilatation N/A 12/22/2014    Procedure: CYSTOSCOPY WITH URETHRAL DILATATION;  Surgeon: Rana Snare, MD;  Location: [facility];  Service: Urology;  Laterality: N/A;  BALLOON DILATION CATHETER    . Insertion of suprapubic catheter N/A 12/22/2014    Procedure: INSERTION OF SUPRAPUBIC CATHETER ;  Surgeon: Rana Snare, MD;  Location: [facility];  Service: Urology;  Laterality: N/A;    Current Outpatient Prescriptions  Medication Sig Dispense Refill  . acetaminophen (TYLENOL) 500 MG tablet Take 500 mg by mouth every 6 (six) hours as needed for pain or fever.     Marland Kitchen aspirin EC 81 MG EC tablet Take 1 tablet (81 mg total) by mouth daily. 30 tablet 0  . atorvastatin (LIPITOR) 80 MG tablet Take 40 mg by mouth every evening.    . calcium carbonate (TUMS - DOSED IN MG ELEMENTAL CALCIUM) 500 MG chewable tablet Chew 2 tablets by mouth as needed for indigestion or heartburn.    . Cholecalciferol (VITAMIN D) 2000 UNITS tablet  Take 2,000 Units by mouth daily.    Marland Kitchen galantamine (RAZADYNE ER) 8 MG 24 hr capsule Take 8 mg by mouth daily with breakfast.    . hydroxypropyl methylcellulose (ISOPTO TEARS) 2.5 % ophthalmic solution Place 1 drop into both eyes 2 (two) times daily.     . isosorbide mononitrate (IMDUR) 60 MG 24 hr tablet Take 0.5 tablets (30 mg total) by mouth every morning.    . Multiple Vitamin (MULTIVITAMIN WITH MINERALS) TABS Take 1 tablet by mouth daily.    . nitroGLYCERIN (NITROSTAT) 0.4 MG SL tablet Place 0.4 mg under the tongue every 5 (five) minutes as needed for chest pain.     Marland Kitchen  oxybutynin (DITROPAN) 5 MG tablet Take 5 mg by mouth 2 (two) times daily. For bladder spasms  1   No current facility-administered medications for this visit.    Allergies  Allergen Reactions  . Pantoprazole Other (See Comments)    Headache and lightheaded  . Nitrofurantoin Hives  . Sulfa Antibiotics Hives and Itching    Social History   Social History  . Marital Status: Married    Spouse Name: N/A  . Number of Children: N/A  . Years of Education: N/A   Occupational History  . Not on file.   Social History Main Topics  . Smoking status: Former Smoker -- 40 years    Types: Cigars    Quit date: 12/24/1983  . Smokeless tobacco: Former Systems developer    Types: Chew    Quit date: 08/28/2013  . Alcohol Use: No  . Drug Use: No  . Sexual Activity: Not on file   Other Topics Concern  . Not on file   Social History Narrative    Family History  Problem Relation Age of Onset  . Heart disease Mother   . Diabetes Father   . Heart disease Brother     Review of Systems:  As stated in the HPI and otherwise negative.   BP 110/62 mmHg  Pulse 52  Physical Examination: General: Well developed, well nourished, NAD HEENT: OP clear, mucus membranes moist SKIN: warm, dry. No rashes. Neuro: No focal deficits Musculoskeletal: Muscle strength 5/5 all ext Psychiatric: Mood and affect normal Neck: No JVD, no carotid  bruits, no thyromegaly, no lymphadenopathy. Lungs:Clear bilaterally, no wheezes, rhonci, crackles Cardiovascular: Regular rate and rhythm. No murmurs, gallops or rubs. Abdomen:Soft. Bowel sounds present. Non-tender.  Extremities: No lower extremity edema. Pulses are 2 + in the bilateral DP/PT.  Cardiac cath 08/28/13: The left main coronary artery is is severely involved distally with a string-like obstruction of at least 95%. This vessel is heavily calcified..  The left anterior descending artery is totally occluded..  The left circumflex artery is totally occluded.  The right coronary artery is totally occluded proximally. Right to right homocollaterals and noted to feel a right ventricular branch.Marland Kitchen  BYPASS GRAFT ANGIOGRAPHY: The saphenous vein graft to the obtuse marginal is widely patent there is proximal segmental 30-50% narrowing. The obtuse marginal territory supplies collaterals to the distal right coronary.  The saphenous vein graft to the right coronary is totally occluded  The saphenous vein graft to the ramus/diagonal is totally occluded  The left internal mammary graft to the LAD and diagonal is widely patent  LEFT VENTRICULOGRAM: Left ventricular angiogram was done in the 30 RAO projection and revealed a well formed inferoposterior aneurysm. The LV cavity is dilated. A focal portion of the mid anterior wall is akinetic. Overall LV function reveals an EF of 35-40%. No obvious mitral regurgitation is noted although power injection was not performed and mild regurgitation could be missed.  IMPRESSIONS: 1. Widely patent sequential left internal graft to the diagonal/LAD.  2. Widely patent saphenous vein graft to the obtuse marginal with proximal 50% narrowing noted in the graft.  3. Total occlusion of the saphenous vein grafts to the right coronary, the ramus intermedius, and the diagonal.  4. Left ventricular dysfunction with inferobasal aneurysm and mid anterior wall region of akinesis.  Overall EF is 35-40% .  5. Total occlusion of the native circulation.  EKG:  EKG is ordered today. The ekg ordered today demonstrates sinus brady, rate 52 bpm. 1st degree AV  block. Old inferior infarct.   Recent Labs: 12/04/2014: ALT 20 12/07/2014: BUN 15; Creatinine, Ser 0.89; Potassium 3.7; Sodium 139 12/17/2014: Hemoglobin 14.7; Platelets 183   Lipid Panel    Component Value Date/Time   CHOL 126 08/28/2013 0020   TRIG 34 08/28/2013 0020   HDL 54 08/28/2013 0020   CHOLHDL 2.3 08/28/2013 0020   VLDL 7 08/28/2013 0020   LDLCALC 65 08/28/2013 0020     Wt Readings from Last 3 Encounters:  12/22/14 156 lb 6.4 oz (70.943 kg)  12/18/14 156 lb 6.4 oz (70.943 kg)  12/17/14 155 lb (70.308 kg)     Other studies Reviewed: Additional studies/ records that were reviewed today include: . Review of the above records demonstrates:    Assessment and Plan:   1. CORONARY ARTERY DISEASE: Stable. Cardiac cath June 2015 with stable anatomy. Continue statin, ASA. Imdur. No beta blocker secondary to bradycardia. No Ace-inh secondary to hypotension.   2. Hyperlipidemia: Continue statin. Lipids well controlled when last checked in 2015. Will check lipids and LFTs now.   3. HTN: BP well controlled. No changes today.   4. Ischemic cardiomyopathy: LVEF=35-40% by LV gram. He does not tolerate Ace-inh or beta blockers.   5. GERD: He uses Zantac prn and has had good control of reflux.   Current medicines are reviewed at length with the patient today.  The patient does not have concerns regarding medicines.  The following changes have been made:  no change  Labs/ tests ordered today include:   Orders Placed This Encounter  Procedures  . Lipid Profile  . Hepatic function panel  . EKG 12-Lead    Disposition:   FU with me in 6 months  Signed, Lauree Chandler, MD 07/17/2015 10:39 AM    [facility] [place], [place]  [address] Phone: [phone]; Fax:  [phone]

## 2015-07-23 ENCOUNTER — Other Ambulatory Visit (INDEPENDENT_AMBULATORY_CARE_PROVIDER_SITE_OTHER): Payer: Medicare Other | Admitting: *Deleted

## 2015-07-23 DIAGNOSIS — E785 Hyperlipidemia, unspecified: Secondary | ICD-10-CM

## 2015-07-23 LAB — HEPATIC FUNCTION PANEL
ALT: 17 U/L (ref 9–46)
AST: 25 U/L (ref 10–35)
Albumin: 3.9 g/dL (ref 3.6–5.1)
Alkaline Phosphatase: 63 U/L (ref 40–115)
Bilirubin, Direct: 0.2 mg/dL (ref <=0.2)
Indirect Bilirubin: 0.7 mg/dL (ref 0.2–1.2)
Total Bilirubin: 0.9 mg/dL (ref 0.2–1.2)
Total Protein: 6.2 g/dL (ref 6.1–8.1)

## 2015-07-23 LAB — LIPID PANEL
Cholesterol: 133 mg/dL (ref 125–200)
HDL: 52 mg/dL (ref >=40)
LDL Cholesterol: 71 mg/dL (ref <130)
Total CHOL/HDL Ratio: 2.6 Ratio (ref <=5.0)
Triglycerides: 49 mg/dL (ref <150)
VLDL: 10 mg/dL (ref <30)

## 2015-08-07 DIAGNOSIS — N358 Other urethral stricture: Secondary | ICD-10-CM | POA: Diagnosis not present

## 2015-08-07 DIAGNOSIS — N3281 Overactive bladder: Secondary | ICD-10-CM | POA: Diagnosis not present

## 2015-08-07 DIAGNOSIS — Z8546 Personal history of malignant neoplasm of prostate: Secondary | ICD-10-CM | POA: Diagnosis not present

## 2015-09-18 DIAGNOSIS — N3281 Overactive bladder: Secondary | ICD-10-CM | POA: Diagnosis not present

## 2015-09-26 DIAGNOSIS — N390 Urinary tract infection, site not specified: Secondary | ICD-10-CM

## 2015-09-26 HISTORY — DX: Urinary tract infection, site not specified: N39.0

## 2015-10-13 ENCOUNTER — Encounter (HOSPITAL_COMMUNITY): Payer: Self-pay | Admitting: Nurse Practitioner

## 2015-10-13 ENCOUNTER — Inpatient Hospital Stay (HOSPITAL_COMMUNITY)
Admission: EM | Admit: 2015-10-13 | Discharge: 2015-10-16 | DRG: 699 | Disposition: A | Discharge status: Home Health | Payer: Medicare Other | Attending: Internal Medicine | Admitting: Internal Medicine

## 2015-10-13 ENCOUNTER — Emergency Department (HOSPITAL_COMMUNITY): Payer: Medicare Other

## 2015-10-13 DIAGNOSIS — E860 Dehydration: Secondary | ICD-10-CM | POA: Diagnosis present

## 2015-10-13 DIAGNOSIS — I255 Ischemic cardiomyopathy: Secondary | ICD-10-CM | POA: Diagnosis present

## 2015-10-13 DIAGNOSIS — Z9359 Other cystostomy status: Secondary | ICD-10-CM

## 2015-10-13 DIAGNOSIS — Z8249 Family history of ischemic heart disease and other diseases of the circulatory system: Secondary | ICD-10-CM

## 2015-10-13 DIAGNOSIS — B9620 Unspecified Escherichia coli [E. coli] as the cause of diseases classified elsewhere: Secondary | ICD-10-CM | POA: Diagnosis present

## 2015-10-13 DIAGNOSIS — I110 Hypertensive heart disease with heart failure: Secondary | ICD-10-CM | POA: Diagnosis not present

## 2015-10-13 DIAGNOSIS — Z833 Family history of diabetes mellitus: Secondary | ICD-10-CM

## 2015-10-13 DIAGNOSIS — N3281 Overactive bladder: Secondary | ICD-10-CM | POA: Diagnosis present

## 2015-10-13 DIAGNOSIS — E785 Hyperlipidemia, unspecified: Secondary | ICD-10-CM | POA: Diagnosis present

## 2015-10-13 DIAGNOSIS — Z8546 Personal history of malignant neoplasm of prostate: Secondary | ICD-10-CM

## 2015-10-13 DIAGNOSIS — I5022 Chronic systolic (congestive) heart failure: Secondary | ICD-10-CM | POA: Diagnosis present

## 2015-10-13 DIAGNOSIS — F0390 Unspecified dementia without behavioral disturbance: Secondary | ICD-10-CM | POA: Diagnosis present

## 2015-10-13 DIAGNOSIS — D696 Thrombocytopenia, unspecified: Secondary | ICD-10-CM | POA: Diagnosis not present

## 2015-10-13 DIAGNOSIS — K219 Gastro-esophageal reflux disease without esophagitis: Secondary | ICD-10-CM | POA: Diagnosis present

## 2015-10-13 DIAGNOSIS — Z85820 Personal history of malignant melanoma of skin: Secondary | ICD-10-CM

## 2015-10-13 DIAGNOSIS — Z87891 Personal history of nicotine dependence: Secondary | ICD-10-CM

## 2015-10-13 DIAGNOSIS — N179 Acute kidney failure, unspecified: Secondary | ICD-10-CM | POA: Diagnosis present

## 2015-10-13 DIAGNOSIS — Z951 Presence of aortocoronary bypass graft: Secondary | ICD-10-CM

## 2015-10-13 DIAGNOSIS — I252 Old myocardial infarction: Secondary | ICD-10-CM

## 2015-10-13 DIAGNOSIS — Z87442 Personal history of urinary calculi: Secondary | ICD-10-CM

## 2015-10-13 DIAGNOSIS — N390 Urinary tract infection, site not specified: Secondary | ICD-10-CM | POA: Diagnosis not present

## 2015-10-13 DIAGNOSIS — T83511A Infection and inflammatory reaction due to indwelling urethral catheter, initial encounter: Principal | ICD-10-CM | POA: Diagnosis present

## 2015-10-13 DIAGNOSIS — I2510 Atherosclerotic heart disease of native coronary artery without angina pectoris: Secondary | ICD-10-CM | POA: Diagnosis present

## 2015-10-13 DIAGNOSIS — R531 Weakness: Secondary | ICD-10-CM

## 2015-10-13 DIAGNOSIS — Z923 Personal history of irradiation: Secondary | ICD-10-CM

## 2015-10-13 DIAGNOSIS — Y846 Urinary catheterization as the cause of abnormal reaction of the patient, or of later complication, without mention of misadventure at the time of the procedure: Secondary | ICD-10-CM | POA: Diagnosis present

## 2015-10-13 DIAGNOSIS — R509 Fever, unspecified: Secondary | ICD-10-CM | POA: Diagnosis not present

## 2015-10-13 HISTORY — DX: Urinary tract infection, site not specified: N39.0

## 2015-10-13 LAB — CBC WITH DIFFERENTIAL/PLATELET
Basophils Absolute: 0 10*3/uL (ref 0.0–0.1)
Basophils Relative: 0 %
Eosinophils Absolute: 0 10*3/uL (ref 0.0–0.7)
Eosinophils Relative: 0 %
HCT: 45.5 % (ref 39.0–52.0)
Hemoglobin: 15.1 g/dL (ref 13.0–17.0)
Lymphocytes Relative: 4 %
Lymphs Abs: 0.7 10*3/uL (ref 0.7–4.0)
MCH: 30.8 pg (ref 26.0–34.0)
MCHC: 33.2 g/dL (ref 30.0–36.0)
MCV: 92.9 fL (ref 78.0–100.0)
Monocytes Absolute: 1.9 10*3/uL — ABNORMAL HIGH (ref 0.1–1.0)
Monocytes Relative: 11 %
Neutro Abs: 14.1 10*3/uL — ABNORMAL HIGH (ref 1.7–7.7)
Neutrophils Relative %: 84 %
Platelets: 105 10*3/uL — ABNORMAL LOW (ref 150–400)
RBC: 4.9 MIL/uL (ref 4.22–5.81)
RDW: 13.7 % (ref 11.5–15.5)
WBC: 16.7 10*3/uL — ABNORMAL HIGH (ref 4.0–10.5)

## 2015-10-13 LAB — COMPREHENSIVE METABOLIC PANEL
ALT: 22 U/L (ref 17–63)
AST: 29 U/L (ref 15–41)
Albumin: 4.1 g/dL (ref 3.5–5.0)
Alkaline Phosphatase: 71 U/L (ref 38–126)
Anion gap: 7 (ref 5–15)
BUN: 21 mg/dL — ABNORMAL HIGH (ref 6–20)
CO2: 27 mmol/L (ref 22–32)
Calcium: 9.4 mg/dL (ref 8.9–10.3)
Chloride: 101 mmol/L (ref 101–111)
Creatinine, Ser: 1.44 mg/dL — ABNORMAL HIGH (ref 0.61–1.24)
GFR calc Af Amer: 49 mL/min — ABNORMAL LOW (ref >60)
GFR calc non Af Amer: 42 mL/min — ABNORMAL LOW (ref >60)
Glucose, Bld: 141 mg/dL — ABNORMAL HIGH (ref 65–99)
Potassium: 4.2 mmol/L (ref 3.5–5.1)
Sodium: 135 mmol/L (ref 135–145)
Total Bilirubin: 1.8 mg/dL — ABNORMAL HIGH (ref 0.3–1.2)
Total Protein: 6.9 g/dL (ref 6.5–8.1)

## 2015-10-13 LAB — URINE MICROSCOPIC-ADD ON

## 2015-10-13 LAB — URINALYSIS, ROUTINE W REFLEX MICROSCOPIC
Bilirubin Urine: NEGATIVE
Glucose, UA: NEGATIVE mg/dL
Ketones, ur: NEGATIVE mg/dL
Nitrite: POSITIVE — AB
Protein, ur: 100 mg/dL — AB
Specific Gravity, Urine: 1.018 (ref 1.005–1.030)
pH: 5.5 (ref 5.0–8.0)

## 2015-10-13 LAB — I-STAT CG4 LACTIC ACID, ED: Lactic Acid, Venous: 1.21 mmol/L (ref 0.5–1.9)

## 2015-10-13 MED ORDER — SODIUM CHLORIDE 0.9 % IV BOLUS (SEPSIS)
500.0000 mL | Freq: Once | INTRAVENOUS | Status: AC
  Administered 2015-10-13: 500 mL via INTRAVENOUS

## 2015-10-13 MED ORDER — CEFTRIAXONE SODIUM 1 G IJ SOLR
1.0000 g | Freq: Once | INTRAMUSCULAR | Status: AC
  Administered 2015-10-13: 1 g via INTRAVENOUS
  Filled 2015-10-13: qty 10

## 2015-10-13 NOTE — ED Notes (Addendum)
Family c/o 1 day history of weakness, fatigue, fevers, confusion. The pt was unable to stand and walk from chair as he normally can. He c/o pain all over. He has an indwelling catheter for past year due to urinary issues and has had frequent UTIs. Marland Kitchen He is alert, breathing easily

## 2015-10-13 NOTE — ED Provider Notes (Signed)
CSN: [id number]     Arrival date & time 10/13/15  1807 History   First MD Initiated Contact with Patient 10/13/15 2006     Chief Complaint  Patient presents with  . Weakness  . Fever    HPI Pt went to get up this am and he could not get out of his chair.  He felt very weak and hot.  He thought he might be running a fever but did not check his temperature.  This afternoon he started shivering.  He had a fever up to 100.3.  No cough, no vomiting.  Some loose stool but not unusual for him.  He did not have diarrhea.    No rashes.  Pt has history of an indwelling urinary catheter and has had recurrent urine infections. Past Medical History  Diagnosis Date  . Hyperlipidemia   . S/P CABG (coronary artery bypass graft)     [place]  . History of Bell's palsy     RIGHT SIDE-- NO RESIDUAL  . OAB (overactive bladder)   . Chronic cystitis   . Ischemic cardiomyopathy     ef 35-40% per cath 08-28-2013  . Coronary artery disease CARDIOLOGIST-  DR Grace Bushy  [place] -- S/P  11/89 CABG x6 (70% circ; 90% PD; 60-70% distal left main; 30% left circ; 90% 1st diag;, 2nd diag and 3rd diag. w/90%,  mild stenosis LAD and 60-70% pLAD)  Re-do CABG x5 in 1997  . Wears hearing aid     bilateral  . Wears partial dentures   . At risk for sleep apnea     STOP-BANG= 4    SENT TO PCP 12-23-2013  . GERD (gastroesophageal reflux disease)   . Mild dementia   . Kidney stones "years ago"    "passed them"  . Hypertension     "not anymore" (01/02/2014)  . Myocardial infarction ([place]) 1986; 1997  . Arthritis     "joints ache" (01/02/2014)  . CHF (congestive heart failure) ([place])   . Skipped heart beats     occasional  . Hx of dizziness   . Diverticulosis   . Urethral stricture   . Bladder calculi   . Nocturia   . Prostate cancer ([place]) 1998    S/P  [place]; [place]  . Melanoma of ear [facility])     "right"   Past Surgical History  Procedure Laterality Date  . Eus   10/05/2011    Procedure: ESOPHAGEAL ENDOSCOPIC ULTRASOUND (EUS) RADIAL;  Surgeon: Arta Silence, MD;  [facility];  Service: Endoscopy;  Laterality: N/A;  . Tonsillectomy and adenoidectomy  1954  . Laparoscopic cholecystectomy  12-23-2005  . Radioactive prostate seed implants  1998  . Cardiovascular stress test  08-01-2011  dr Angelena Form    inferior scar and possible soft tissue attenuation with minimal peri-infarct ischemia, small region of anterior ischemia and scar/  LVEF 53% LV wall motion with inferior hypokinesis/ no significant change from scan july 2011  . Cataract extraction w/ intraocular lens  implant, bilateral Bilateral 2007  . Cystoscopy with urethral dilatation N/A 12/25/2013    Procedure: CYSTOSCOPY WITH URETHRAL DILATATION, WITH BIOPSY;  Surgeon: Bernestine Amass, MD;  Location: [facility];  Service: Urology;  Laterality: N/A;  . Melanoma excision  X 1    "ear"  . Cardiac catheterization  02-22-2005  dr Vidal Schwalbe    mild to moderate lv dysfunction with inferobasilar  akinesis/  totally occluded SVG to Intermediate Diagonal and SVG to PDA and RCA branches, totally occluded native coronary circulation with diffuse disease pLAD diagonal system with potentially could be ischemic/  patent SVG to OM with collaterals to dRCA and patent LIMA to LAD and diagonal systemss  . Cardiac catheterization  08-28-2013  DR Daneen Schick    widely patent sequential left internal graft to the diagonal/LAD, widely patent SVG to OM with proximal 50% narrowing noted in the graft, total occlusion SVG's to RCA, RI, and the Diagonal/ LV dysfunction with inferobasal aneurysm and mid anterior wall region of akinesis/  overall EF 35-40%/  Total occlusion of the navtive circulation/  no significant change compared to 2006 cath  . Skin cancer excision  X 2    "top of head"  . Left heart catheterization with coronary/graft angiogram N/A 08/28/2013    Procedure: LEFT HEART CATHETERIZATION WITH  Beatrix Fetters;  Surgeon: Sinclair Grooms, MD;  Location: [facility] CATH LAB;  Service: Cardiovascular;  Laterality: N/A;  . Coronary artery bypass graft  11/ 1989  &  10/ 1997    1989-- 6 vessel/  1997 Re-do 5 vessel  . Cystoscopy with urethral dilatation N/A 12/22/2014    Procedure: CYSTOSCOPY WITH URETHRAL DILATATION;  Surgeon: Rana Snare, MD;  [facility]: [facility];  Service: Urology;  Laterality: N/A;  BALLOON DILATION CATHETER    . Insertion of suprapubic catheter N/A 12/22/2014    Procedure: INSERTION OF SUPRAPUBIC CATHETER ;  Surgeon: Rana Snare, MD;  Location: [facility];  Service: Urology;  Laterality: N/A;   Family History  Problem Relation Age of Onset  . Heart disease Mother   . Diabetes Father   . Heart disease Brother    Social History  Substance Use Topics  . Smoking status: Former Smoker -- 40 years    Types: Cigars    Quit date: 12/24/1983  . Smokeless tobacco: Former Systems developer    Types: Chew    Quit date: 08/28/2013  . Alcohol Use: No    Review of Systems  All other systems reviewed and are negative.     Allergies  Pantoprazole; Nitrofurantoin; and Sulfa antibiotics  Home Medications   Prior to Admission medications   Medication Sig Start Date End Date Taking? Authorizing Provider  acetaminophen (TYLENOL) 500 MG tablet Take 500 mg by mouth every 6 (six) hours as needed for pain or fever.    Yes Historical Provider, MD  aspirin EC 81 MG EC tablet Take 1 tablet (81 mg total) by mouth daily. 08/29/13  Yes Brittainy Erie Noe, PA-C  atorvastatin (LIPITOR) 80 MG tablet Take 40 mg by mouth every evening. 09/23/13  Yes Imogene Burn, PA-C  calcium carbonate (TUMS - DOSED IN MG ELEMENTAL CALCIUM) 500 MG chewable tablet Chew 2 tablets by mouth as needed for indigestion or heartburn.   Yes Historical Provider, MD  Cholecalciferol (VITAMIN D) 2000 UNITS tablet Take 2,000 Units by mouth daily.   Yes Historical Provider, MD  flavoxATE (URISPAS) 100 MG tablet Take 100 mg by  mouth 3 (three) times daily as needed for bladder spasms.   Yes Historical Provider, MD  galantamine (RAZADYNE ER) 8 MG 24 hr capsule Take 8 mg by mouth daily with breakfast.   Yes Historical Provider, MD  hydroxypropyl methylcellulose (ISOPTO TEARS) 2.5 % ophthalmic solution Place 1 drop into both eyes 2 (two) times daily.    Yes Historical Provider, MD  isosorbide mononitrate (IMDUR) 60 MG 24 hr tablet Take 0.5  tablets (30 mg total) by mouth every morning. 01/07/14  Yes Shanker Kristeen Mans, MD  Multiple Vitamin (MULTIVITAMIN WITH MINERALS) TABS Take 1 tablet by mouth daily.   Yes Historical Provider, MD  nitroGLYCERIN (NITROSTAT) 0.4 MG SL tablet Place 0.4 mg under the tongue every 5 (five) minutes as needed for chest pain.    Yes Historical Provider, MD  phenazopyridine (PYRIDIUM) 200 MG tablet Take 200 mg by mouth 3 (three) times daily as needed for pain.   Yes Historical Provider, MD   BP 114/57 mmHg  Pulse 63  Temp(Src) 98.9 F (37.2 C) (Oral)  Resp 21  SpO2 95% Physical Exam  Constitutional: No distress.  HENT:  Head: Normocephalic and atraumatic.  Right Ear: External ear normal.  Left Ear: External ear normal.  Eyes: Conjunctivae are normal. Right eye exhibits no discharge. Left eye exhibits no discharge. No scleral icterus.  Neck: Neck supple. No tracheal deviation present.  Cardiovascular: Normal rate, regular rhythm and intact distal pulses.   Pulmonary/Chest: Effort normal and breath sounds normal. No stridor. No respiratory distress. He has no wheezes. He has no rales.  Abdominal: Soft. Bowel sounds are normal. He exhibits no distension. There is no tenderness. There is no rebound and no guarding.  Suprapubic catheter  Musculoskeletal: He exhibits no edema or tenderness.  Neurological: He is alert. No cranial nerve deficit (no facial droop, extraocular movements intact, no slurred speech) or sensory deficit. He exhibits normal muscle tone. He displays no seizure activity.  Coordination normal.  General weakness  Skin: Skin is warm and dry. No rash noted.  Psychiatric: He has a normal mood and affect.  Nursing note and vitals reviewed.   ED Course  Procedures (including critical care time) Labs Review Labs Reviewed  COMPREHENSIVE METABOLIC PANEL - Abnormal; Notable for the following:    Glucose, Bld 141 (*)    BUN 21 (*)    Creatinine, Ser 1.44 (*)    Total Bilirubin 1.8 (*)    GFR calc non Af Amer 42 (*)    GFR calc Af Amer 49 (*)    All other components within normal limits  CBC WITH DIFFERENTIAL/PLATELET - Abnormal; Notable for the following:    WBC 16.7 (*)    Platelets 105 (*)    Neutro Abs 14.1 (*)    Monocytes Absolute 1.9 (*)    All other components within normal limits  URINALYSIS, ROUTINE W REFLEX MICROSCOPIC (NOT AT [facility]) - Abnormal; Notable for the following:    Color, Urine AMBER (*)    APPearance CLOUDY (*)    Hgb urine dipstick MODERATE (*)    Protein, ur 100 (*)    Nitrite POSITIVE (*)    Leukocytes, UA LARGE (*)    All other components within normal limits  URINE MICROSCOPIC-ADD ON - Abnormal; Notable for the following:    Squamous Epithelial / LPF 0-5 (*)    Bacteria, UA MANY (*)    All other components within normal limits  URINE CULTURE  I-STAT CG4 LACTIC ACID, ED    Imaging Review Dg Chest Portable 1 View  10/13/2015  CLINICAL DATA:  Fever EXAM: PORTABLE CHEST 1 VIEW COMPARISON:  December 04, 2014 FINDINGS: There is patchy opacity in the left base, also present on prior study, likely due to scarring. Lungs elsewhere are clear. Heart is borderline enlarged with pulmonary vascularity within normal limits. No adenopathy. Patient is status post coronary artery bypass grafting. No bone lesions. IMPRESSION: Scarring left base. Subtle superimposed pneumonia in  this area cannot be entirely excluded. Lungs elsewhere clear. Stable cardiac silhouette. Electronically Signed   By: Lowella Grip III M.D.   On: 10/13/2015 21:16   I  have personally reviewed and evaluated these images and lab results as part of my medical decision-making.  Medications given in the ED Medications  cefTRIAXone (ROCEPHIN) 1 g in dextrose 5 % 50 mL IVPB (1 g Intravenous New Bag/Given 10/13/15 2101)  sodium chloride 0.9 % bolus 500 mL (500 mLs Intravenous New Bag/Given 10/13/15 2102)      MDM   Final diagnoses:  UTI (lower urinary tract infection)   Patient presents to the emergency room with complaints of weakness. He has a history of frequent urinary tract infections associated with a suprapubic catheter. Laboratory tests show an elevated white blood cell count. Urinalysis does suggest a urinary tract infection although it's always difficult to tell whether the there is a component of chronic colonization. Chest x-ray cannot exclude pneumonia however the patient is not having any respiratory symptoms.  Blood pressure is stable. Lactic acid levels not elevated. I doubt sepsis.  I will consult the medical service for admission considering his weakness and leukocytosis.    Dorie Rank, MD 10/13/15 2136

## 2015-10-13 NOTE — H&P (Signed)
History and Physical    [name] [id number] DOB: 1928/03/05 DOA: 10/13/2015  PCP: Nyoka Cowden, MD  Patient coming from: Home.  Chief Complaint: Weakness. Fever chills.  HPI: Ronald [name] is a 82 y.o. 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 As per the patient's family patient has been having increasing weakness since morning unable to get out of the bed. Weakness is generalized and nonfocal. Denies losing consciousness. Denies any nausea vomiting or diarrhea but did not eat well since morning. Patient has been having subjective feeling of fever and chills since night before. As per the patient's wife patient had a temperature of at least 101F at home. In the ER patient is afebrile UA shows features consistent with UTI and creatinine is elevated from baseline. Since patient is weak and shows features consistent with UTI patient is being admitted for further observation and management. As per patient's wife patient gets weak all the time when he gets UTI. Denies any chest pain or shortness of breath.   ED Course: Urine cultures were obtained and started on ceftriaxone and patient was given 500 mL bolus in the ER.  Review of Systems: As per HPI, rest all negative.   Past Medical History  Diagnosis Date  . Hyperlipidemia   . S/P CABG (coronary artery bypass graft)     [place]  . History of Bell's palsy     RIGHT SIDE-- NO RESIDUAL  . OAB (overactive bladder)   . Chronic cystitis   . Ischemic cardiomyopathy     ef 35-40% per cath 08-28-2013  . Coronary artery disease CARDIOLOGIST-  DR Grace Bushy  MI -- S/P  11/89 CABG x6 (70% circ; 90% PD; 60-70% distal left main; 30% left circ; 90% 1st diag;, 2nd diag and 3rd diag. w/90%,  mild stenosis LAD and 60-70%  pLAD)  Re-do CABG x5 in 1997  . Wears hearing aid     bilateral  . Wears partial dentures   . At risk for sleep apnea     STOP-BANG= 4    SENT TO PCP 12-23-2013  . GERD (gastroesophageal reflux disease)   . Mild dementia   . Kidney stones "years ago"    "passed them"  . Hypertension     "not anymore" (01/02/2014)  . Myocardial infarction ([place]) 1986; 1997  . Arthritis     "joints ache" (01/02/2014)  . CHF (congestive heart failure) ([place])   . Skipped heart beats     occasional  . Hx of dizziness   . Diverticulosis   . Urethral stricture   . Bladder calculi   . Nocturia   . Prostate cancer ([place]) 1998    S/P  [place]; [place]  . Melanoma of ear [facility])     "right"    Past Surgical History  Procedure Laterality Date  . Eus  10/05/2011    Procedure: ESOPHAGEAL ENDOSCOPIC ULTRASOUND (EUS) RADIAL;  Surgeon: Arta Silence, MD;  Location: [facility] ENDOSCOPY;  Service: Endoscopy;  Laterality: N/A;  . Tonsillectomy and adenoidectomy  1954  . Laparoscopic cholecystectomy  12-23-2005  . Radioactive prostate seed implants  1998  . Cardiovascular stress test  08-01-2011  dr Angelena Form    inferior scar and possible soft tissue attenuation with minimal peri-infarct ischemia, small region  of anterior ischemia and scar/  LVEF 53% LV wall motion with inferior hypokinesis/ no significant change from scan july 2011  . Cataract extraction w/ intraocular lens  implant, bilateral Bilateral 2007  . Cystoscopy with urethral dilatation N/A 12/25/2013    Procedure: CYSTOSCOPY WITH URETHRAL DILATATION, WITH BIOPSY;  Surgeon: Bernestine Amass, MD;  Location[facility];  Service: Urology;  Laterality: N/A;  . Melanoma excision  X 1    "ear"  . Cardiac catheterization  02-22-2005  dr Vidal Schwalbe    mild to moderate lv dysfunction with inferobasilar akinesis/  totally occluded SVG to Intermediate Diagonal and SVG to PDA and RCA branches, totally occluded native coronary  circulation with diffuse disease pLAD diagonal system with potentially could be ischemic/  patent SVG to OM with collaterals to dRCA and patent LIMA to LAD and diagonal systemss  . Cardiac catheterization  08-28-2013  DR Daneen Schick    widely patent sequential left internal graft to the diagonal/LAD, widely patent SVG to OM with proximal 50% narrowing noted in the graft, total occlusion SVG's to RCA, RI, and the Diagonal/ LV dysfunction with inferobasal aneurysm and mid anterior wall region of akinesis/  overall EF 35-40%/  Total occlusion of the navtive circulation/  no significant change compared to 2006 cath  . Skin cancer excision  X 2    "top of head"  . Left heart catheterization with coronary/graft angiogram N/A 08/28/2013    Procedure: LEFT HEART CATHETERIZATION WITH Beatrix Fetters;  Surgeon: Sinclair Grooms, MD;  Location: [facility] CATH LAB;  Service: Cardiovascular;  Laterality: N/A;  . Coronary artery bypass graft  11/ 1989  &  10/ 1997    1989-- 6 vessel/  1997 Re-do 5 vessel  . Cystoscopy with urethral dilatation N/A 12/22/2014    Procedure: CYSTOSCOPY WITH URETHRAL DILATATION;  Surgeon: Rana Snare, MD;  Location: [facility] ORS;  Service: Urology;  Laterality: N/A;  BALLOON DILATION CATHETER    . Insertion of suprapubic catheter N/A 12/22/2014    Procedure: INSERTION OF SUPRAPUBIC CATHETER ;  Surgeon: Rana Snare, MD;  Location: [facility] ORS;  Service: Urology;  Laterality: N/A;     reports that he quit smoking about 31 years ago. His smoking use included Cigars. He quit smokeless tobacco use about 2 years ago. His smokeless tobacco use included Chew. He reports that he does not drink alcohol or use illicit drugs.  Allergies  Allergen Reactions  . Pantoprazole Other (See Comments)    Headache and lightheaded  . Nitrofurantoin Hives  . Sulfa Antibiotics Hives and Itching    Family History  Problem Relation Age of Onset  . Heart disease Mother   . Diabetes Father   . Heart disease  Brother     Prior to Admission medications   Medication Sig Start Date End Date Taking? Authorizing Provider  acetaminophen (TYLENOL) 500 MG tablet Take 500 mg by mouth every 6 (six) hours as needed for pain or fever.    Yes Historical Provider, MD  aspirin EC 81 MG EC tablet Take 1 tablet (81 mg total) by mouth daily. 08/29/13  Yes Brittainy Erie Noe, PA-C  atorvastatin (LIPITOR) 80 MG tablet Take 40 mg by mouth every evening. 09/23/13  Yes Imogene Burn, PA-C  calcium carbonate (TUMS - DOSED IN MG ELEMENTAL CALCIUM) 500 MG chewable tablet Chew 2 tablets by mouth as needed for indigestion or heartburn.   Yes Historical Provider, MD  Cholecalciferol (VITAMIN D) 2000 UNITS tablet Take 2,000  Units by mouth daily.   Yes Historical Provider, MD  flavoxATE (URISPAS) 100 MG tablet Take 100 mg by mouth 3 (three) times daily as needed for bladder spasms.   Yes Historical Provider, MD  galantamine (RAZADYNE ER) 8 MG 24 hr capsule Take 8 mg by mouth daily with breakfast.   Yes Historical Provider, MD  hydroxypropyl methylcellulose (ISOPTO TEARS) 2.5 % ophthalmic solution Place 1 drop into both eyes 2 (two) times daily.    Yes Historical Provider, MD  isosorbide mononitrate (IMDUR) 60 MG 24 hr tablet Take 0.5 tablets (30 mg total) by mouth every morning. 01/07/14  Yes Shanker Kristeen Mans, MD  Multiple Vitamin (MULTIVITAMIN WITH MINERALS) TABS Take 1 tablet by mouth daily.   Yes Historical Provider, MD  nitroGLYCERIN (NITROSTAT) 0.4 MG SL tablet Place 0.4 mg under the tongue every 5 (five) minutes as needed for chest pain.    Yes Historical Provider, MD  phenazopyridine (PYRIDIUM) 200 MG tablet Take 200 mg by mouth 3 (three) times daily as needed for pain.   Yes Historical Provider, MD    Physical Exam: Filed Vitals:   10/13/15 2200 10/13/15 2215 10/13/15 2230 10/13/15 2245  BP: 105/54 114/57 107/50 115/58  Pulse: 65 67 67 68  Temp:      TempSrc:      Resp: 20 21 19 16   SpO2: 95% 95% 96% 96%       Constitutional: Not in distress. Filed Vitals:   10/13/15 2200 10/13/15 2215 10/13/15 2230 10/13/15 2245  BP: 105/54 114/57 107/50 115/58  Pulse: 65 67 67 68  Temp:      TempSrc:      Resp: 20 21 19 16   SpO2: 95% 95% 96% 96%   Eyes: Anicteric no pallor. ENMT: No discharge from ears eyes nose and mouth. Neck: No mass felt. No JVD appreciated. Respiratory: No rhonchi or crepitations. Cardiovascular: S1 and S2 heard. Abdomen: Soft nontender bowel sounds present. No guarding or rigidity. Suprapubic catheter is seen. Musculoskeletal: No edema. Skin: No rash. Neurologic: Alert awake oriented to time place and person. Moves all extremities. Psychiatric: Appears normal.   Labs on Admission: I have personally reviewed following labs and imaging studies  CBC:  Recent Labs Lab 10/13/15 1835  WBC 16.7*  NEUTROABS 14.1*  HGB 15.1  HCT 45.5  MCV 92.9  PLT 123456*   Basic Metabolic Panel:  Recent Labs Lab 10/13/15 1835  NA 135  K 4.2  CL 101  CO2 27  GLUCOSE 141*  BUN 21*  CREATININE 1.44*  CALCIUM 9.4   GFR: CrCl cannot be calculated (Unknown ideal weight.). Liver Function Tests:  Recent Labs Lab 10/13/15 1835  AST 29  ALT 22  ALKPHOS 71  BILITOT 1.8*  PROT 6.9  ALBUMIN 4.1   No results for input(s): LIPASE, AMYLASE in the last 168 hours. No results for input(s): AMMONIA in the last 168 hours. Coagulation Profile: No results for input(s): INR, PROTIME in the last 168 hours. Cardiac Enzymes: No results for input(s): CKTOTAL, CKMB, CKMBINDEX, TROPONINI in the last 168 hours. BNP (last 3 results) No results for input(s): PROBNP in the last 8760 hours. HbA1C: No results for input(s): HGBA1C in the last 72 hours. CBG: No results for input(s): GLUCAP in the last 168 hours. Lipid Profile: No results for input(s): CHOL, HDL, LDLCALC, TRIG, CHOLHDL, LDLDIRECT in the last 72 hours. Thyroid Function Tests: No results for input(s): TSH, T4TOTAL, FREET4,  T3FREE, THYROIDAB in the last 72 hours. Anemia Panel: No results  for input(s): VITAMINB12, FOLATE, FERRITIN, TIBC, IRON, RETICCTPCT in the last 72 hours. Urine analysis:    Component Value Date/Time   COLORURINE AMBER* 10/13/2015 1926   APPEARANCEUR CLOUDY* 10/13/2015 1926   LABSPEC 1.018 10/13/2015 1926   PHURINE 5.5 10/13/2015 1926   GLUCOSEU NEGATIVE 10/13/2015 1926   HGBUR MODERATE* 10/13/2015 1926   BILIRUBINUR NEGATIVE 10/13/2015 1926   BILIRUBINUR neg 06/30/2014 [place] 10/13/2015 1926   PROTEINUR 100* 10/13/2015 1926   PROTEINUR neg 06/30/2014 1530   UROBILINOGEN 0.2 12/04/2014 2344   UROBILINOGEN 0.2 06/30/2014 1530   NITRITE POSITIVE* 10/13/2015 1926   NITRITE neg 06/30/2014 1530   LEUKOCYTESUR LARGE* 10/13/2015 1926   Sepsis Labs: @LABRCNTIP (procalcitonin:4,lacticidven:4) ) Recent Results (from the past 240 hour(s))  Urine culture     Status: None (Preliminary result)   Collection Time: 10/13/15  7:26 PM  Result Value Ref Range Status   Specimen Description URINE, RANDOM  Final   Special Requests PED BAG  Final   Culture PENDING  Incomplete   Report Status PENDING  Incomplete     Radiological Exams on Admission: Dg Chest Portable 1 View  10/13/2015  CLINICAL DATA:  Fever EXAM: PORTABLE CHEST 1 VIEW COMPARISON:  December 04, 2014 FINDINGS: There is patchy opacity in the left base, also present on prior study, likely due to scarring. Lungs elsewhere are clear. Heart is borderline enlarged with pulmonary vascularity within normal limits. No adenopathy. Patient is status post coronary artery bypass grafting. No bone lesions. IMPRESSION: Scarring left base. Subtle superimposed pneumonia in this area cannot be entirely excluded. Lungs elsewhere clear. Stable cardiac silhouette. Electronically Signed   By: Lowella Grip III M.D.   On: 10/13/2015 21:16     Assessment/Plan Principal Problem:   Generalized weakness Active Problems:   Chronic  systolic CHF (congestive heart failure) (HCC)   UTI (lower urinary tract infection)   ARF (acute renal failure) (HCC)    1. Generalized weakness most likely from UTI and dehydration and renal failure - for now I have placed patient on gentle hydration and antibiotics for UTI. Follow blood cultures and urine cultures. Get physical therapy consult. Check cardiac markers TSH and CK levels. 2. UTI with suprapubic catheter - patient is placed on Fortaz. Follow urine and blood cultures. May discuss with urologist in the morning to change the suprapubic catheter. Last one was changed 6 weeks ago. 3. Acute renal failure probably from poor oral intake - since patient has ischemic cardiomyopathy at this time we will cautiously hydrate and closely follow intake output. 4. Chronic systolic heart failure last EF measured in 2015 was 45-50% - not on ACE inhibitor secondary to hypertension as per cardiology. 5. CAD status post CABG - continue aspirin and statins Imdur. Not on beta blocker secondary to bradycardia. 6. Chronic thrombocytopenia - follow CBC.  EKG is pending.   DVT prophylaxis: Heparin. Code Status: Full code.  Family Communication: Patient's wife and son.  Disposition Plan: Home.  Consults called: Physical therapy.  Admission status: Observation. Telemetry.    Rise Patience MD [facility] Pager [phone].  If 7PM-7AM, please contact night-coverage [email or web address] Password [email or web address]  10/13/2015, 11:20 PM

## 2015-10-14 ENCOUNTER — Observation Stay (HOSPITAL_COMMUNITY): Payer: Medicare Other

## 2015-10-14 ENCOUNTER — Encounter (HOSPITAL_COMMUNITY): Payer: Self-pay | Admitting: General Practice

## 2015-10-14 DIAGNOSIS — Z833 Family history of diabetes mellitus: Secondary | ICD-10-CM | POA: Diagnosis not present

## 2015-10-14 DIAGNOSIS — E860 Dehydration: Secondary | ICD-10-CM | POA: Diagnosis not present

## 2015-10-14 DIAGNOSIS — Z87891 Personal history of nicotine dependence: Secondary | ICD-10-CM | POA: Diagnosis not present

## 2015-10-14 DIAGNOSIS — N390 Urinary tract infection, site not specified: Secondary | ICD-10-CM | POA: Diagnosis not present

## 2015-10-14 DIAGNOSIS — Z8249 Family history of ischemic heart disease and other diseases of the circulatory system: Secondary | ICD-10-CM | POA: Diagnosis not present

## 2015-10-14 DIAGNOSIS — E785 Hyperlipidemia, unspecified: Secondary | ICD-10-CM | POA: Diagnosis present

## 2015-10-14 DIAGNOSIS — I252 Old myocardial infarction: Secondary | ICD-10-CM | POA: Diagnosis not present

## 2015-10-14 DIAGNOSIS — I5022 Chronic systolic (congestive) heart failure: Secondary | ICD-10-CM

## 2015-10-14 DIAGNOSIS — Z923 Personal history of irradiation: Secondary | ICD-10-CM | POA: Diagnosis not present

## 2015-10-14 DIAGNOSIS — N179 Acute kidney failure, unspecified: Secondary | ICD-10-CM | POA: Diagnosis not present

## 2015-10-14 DIAGNOSIS — T83511A Infection and inflammatory reaction due to indwelling urethral catheter, initial encounter: Secondary | ICD-10-CM | POA: Diagnosis not present

## 2015-10-14 DIAGNOSIS — D696 Thrombocytopenia, unspecified: Secondary | ICD-10-CM | POA: Diagnosis not present

## 2015-10-14 DIAGNOSIS — N3281 Overactive bladder: Secondary | ICD-10-CM | POA: Diagnosis present

## 2015-10-14 DIAGNOSIS — Z951 Presence of aortocoronary bypass graft: Secondary | ICD-10-CM | POA: Diagnosis not present

## 2015-10-14 DIAGNOSIS — Y846 Urinary catheterization as the cause of abnormal reaction of the patient, or of later complication, without mention of misadventure at the time of the procedure: Secondary | ICD-10-CM | POA: Diagnosis present

## 2015-10-14 DIAGNOSIS — Z8546 Personal history of malignant neoplasm of prostate: Secondary | ICD-10-CM | POA: Diagnosis not present

## 2015-10-14 DIAGNOSIS — R531 Weakness: Secondary | ICD-10-CM | POA: Diagnosis not present

## 2015-10-14 DIAGNOSIS — B9620 Unspecified Escherichia coli [E. coli] as the cause of diseases classified elsewhere: Secondary | ICD-10-CM | POA: Diagnosis not present

## 2015-10-14 DIAGNOSIS — Z9359 Other cystostomy status: Secondary | ICD-10-CM | POA: Diagnosis not present

## 2015-10-14 DIAGNOSIS — K219 Gastro-esophageal reflux disease without esophagitis: Secondary | ICD-10-CM | POA: Diagnosis present

## 2015-10-14 DIAGNOSIS — F0390 Unspecified dementia without behavioral disturbance: Secondary | ICD-10-CM | POA: Diagnosis not present

## 2015-10-14 DIAGNOSIS — Z85820 Personal history of malignant melanoma of skin: Secondary | ICD-10-CM | POA: Diagnosis not present

## 2015-10-14 DIAGNOSIS — I110 Hypertensive heart disease with heart failure: Secondary | ICD-10-CM | POA: Diagnosis not present

## 2015-10-14 DIAGNOSIS — Z87442 Personal history of urinary calculi: Secondary | ICD-10-CM | POA: Diagnosis not present

## 2015-10-14 DIAGNOSIS — I2510 Atherosclerotic heart disease of native coronary artery without angina pectoris: Secondary | ICD-10-CM | POA: Diagnosis not present

## 2015-10-14 DIAGNOSIS — I255 Ischemic cardiomyopathy: Secondary | ICD-10-CM | POA: Diagnosis not present

## 2015-10-14 LAB — COMPREHENSIVE METABOLIC PANEL
ALT: 19 U/L (ref 17–63)
AST: 25 U/L (ref 15–41)
Albumin: 3.4 g/dL — ABNORMAL LOW (ref 3.5–5.0)
Alkaline Phosphatase: 62 U/L (ref 38–126)
Anion gap: 6 (ref 5–15)
BUN: 22 mg/dL — ABNORMAL HIGH (ref 6–20)
CO2: 26 mmol/L (ref 22–32)
Calcium: 8.8 mg/dL — ABNORMAL LOW (ref 8.9–10.3)
Chloride: 104 mmol/L (ref 101–111)
Creatinine, Ser: 1.44 mg/dL — ABNORMAL HIGH (ref 0.61–1.24)
GFR calc Af Amer: 49 mL/min — ABNORMAL LOW (ref >60)
GFR calc non Af Amer: 42 mL/min — ABNORMAL LOW (ref >60)
Glucose, Bld: 122 mg/dL — ABNORMAL HIGH (ref 65–99)
Potassium: 4.2 mmol/L (ref 3.5–5.1)
Sodium: 136 mmol/L (ref 135–145)
Total Bilirubin: 1.3 mg/dL — ABNORMAL HIGH (ref 0.3–1.2)
Total Protein: 6 g/dL — ABNORMAL LOW (ref 6.5–8.1)

## 2015-10-14 LAB — CBC
HCT: 41.4 % (ref 39.0–52.0)
HCT: 45.3 % (ref 39.0–52.0)
Hemoglobin: 13.7 g/dL (ref 13.0–17.0)
Hemoglobin: 14.9 g/dL (ref 13.0–17.0)
MCH: 30.6 pg (ref 26.0–34.0)
MCH: 30.7 pg (ref 26.0–34.0)
MCHC: 32.9 g/dL (ref 30.0–36.0)
MCHC: 33.1 g/dL (ref 30.0–36.0)
MCV: 92.8 fL (ref 78.0–100.0)
MCV: 93 fL (ref 78.0–100.0)
Platelets: 106 10*3/uL — ABNORMAL LOW (ref 150–400)
Platelets: 86 10*3/uL — ABNORMAL LOW (ref 150–400)
RBC: 4.46 MIL/uL (ref 4.22–5.81)
RBC: 4.87 MIL/uL (ref 4.22–5.81)
RDW: 13.8 % (ref 11.5–15.5)
RDW: 13.8 % (ref 11.5–15.5)
WBC: 14.4 10*3/uL — ABNORMAL HIGH (ref 4.0–10.5)
WBC: 15.2 10*3/uL — ABNORMAL HIGH (ref 4.0–10.5)

## 2015-10-14 LAB — CREATININE, SERUM
Creatinine, Ser: 1.28 mg/dL — ABNORMAL HIGH (ref 0.61–1.24)
GFR calc Af Amer: 57 mL/min — ABNORMAL LOW (ref >60)
GFR calc non Af Amer: 49 mL/min — ABNORMAL LOW (ref >60)

## 2015-10-14 LAB — TROPONIN I
Troponin I: 0.03 ng/mL (ref <0.03)
Troponin I: 0.05 ng/mL (ref <0.03)
Troponin I: 0.03 ng/mL (ref <0.03)

## 2015-10-14 LAB — TSH: TSH: 1.784 u[IU]/mL (ref 0.350–4.500)

## 2015-10-14 LAB — CK: Total CK: 98 U/L (ref 49–397)

## 2015-10-14 MED ORDER — NITROGLYCERIN 0.4 MG SL SUBL: 0.4000 mg | SUBLINGUAL_TABLET | SUBLINGUAL | Status: DC | PRN

## 2015-10-14 MED ORDER — CALCIUM CARBONATE ANTACID 500 MG PO CHEW: 2.0000 | CHEWABLE_TABLET | ORAL | Status: DC | PRN

## 2015-10-14 MED ORDER — SODIUM CHLORIDE 0.9 % IV SOLN
INTRAVENOUS | Status: AC
  Administered 2015-10-14: 01:00:00 via INTRAVENOUS

## 2015-10-14 MED ORDER — SODIUM CHLORIDE 0.9 % IV SOLN
INTRAVENOUS | Status: DC
  Administered 2015-10-14: 17:00:00 via INTRAVENOUS

## 2015-10-14 MED ORDER — HEPARIN SODIUM (PORCINE) 5000 UNIT/ML IJ SOLN
5000.0000 [IU] | Freq: Three times a day (TID) | INTRAMUSCULAR | Status: DC
  Administered 2015-10-14 (×2): 5000 [IU] via SUBCUTANEOUS
  Filled 2015-10-14 (×2): qty 1

## 2015-10-14 MED ORDER — ONDANSETRON HCL 4 MG/2ML IJ SOLN: 4.0000 mg | Freq: Four times a day (QID) | INTRAMUSCULAR | Status: DC | PRN

## 2015-10-14 MED ORDER — ACETAMINOPHEN 325 MG PO TABS
650.0000 mg | ORAL_TABLET | Freq: Four times a day (QID) | ORAL | Status: DC | PRN
  Administered 2015-10-14 – 2015-10-16 (×3): 650 mg via ORAL
  Filled 2015-10-14 (×3): qty 2

## 2015-10-14 MED ORDER — VITAMIN D 1000 UNITS PO TABS
2000.0000 [IU] | ORAL_TABLET | Freq: Every day | ORAL | Status: DC
  Administered 2015-10-14 – 2015-10-16 (×3): 2000 [IU] via ORAL
  Filled 2015-10-14 (×3): qty 2

## 2015-10-14 MED ORDER — DEXTROSE 5 % IV SOLN
2.0000 g | Freq: Two times a day (BID) | INTRAVENOUS | Status: DC
  Administered 2015-10-14 – 2015-10-16 (×6): 2 g via INTRAVENOUS
  Filled 2015-10-14 (×7): qty 2

## 2015-10-14 MED ORDER — FLAVOXATE HCL 100 MG PO TABS
100.0000 mg | ORAL_TABLET | Freq: Three times a day (TID) | ORAL | Status: DC | PRN
  Administered 2015-10-15: 100 mg via ORAL
  Filled 2015-10-14 (×3): qty 1

## 2015-10-14 MED ORDER — ATORVASTATIN CALCIUM 40 MG PO TABS
40.0000 mg | ORAL_TABLET | Freq: Every evening | ORAL | Status: DC
  Administered 2015-10-14 – 2015-10-15 (×2): 40 mg via ORAL
  Filled 2015-10-14 (×2): qty 1

## 2015-10-14 MED ORDER — ONDANSETRON HCL 4 MG PO TABS: 4.0000 mg | ORAL_TABLET | Freq: Four times a day (QID) | ORAL | Status: DC | PRN

## 2015-10-14 MED ORDER — GALANTAMINE HYDROBROMIDE ER 8 MG PO CP24
8.0000 mg | ORAL_CAPSULE | Freq: Every day | ORAL | Status: DC
  Administered 2015-10-14 – 2015-10-16 (×3): 8 mg via ORAL
  Filled 2015-10-14 (×3): qty 1

## 2015-10-14 MED ORDER — ADULT MULTIVITAMIN W/MINERALS CH
1.0000 | ORAL_TABLET | Freq: Every day | ORAL | Status: DC
  Administered 2015-10-14 – 2015-10-16 (×3): 1 via ORAL
  Filled 2015-10-14 (×3): qty 1

## 2015-10-14 MED ORDER — ISOSORBIDE MONONITRATE ER 30 MG PO TB24
30.0000 mg | ORAL_TABLET | Freq: Every morning | ORAL | Status: DC
  Administered 2015-10-14 – 2015-10-16 (×3): 30 mg via ORAL
  Filled 2015-10-14 (×3): qty 1

## 2015-10-14 MED ORDER — ACETAMINOPHEN 650 MG RE SUPP: 650.0000 mg | Freq: Four times a day (QID) | RECTAL | Status: DC | PRN

## 2015-10-14 MED ORDER — ASPIRIN EC 81 MG PO TBEC
81.0000 mg | DELAYED_RELEASE_TABLET | Freq: Every day | ORAL | Status: DC
Start: 2015-10-14 — End: 2015-10-16
  Administered 2015-10-14 – 2015-10-16 (×3): 81 mg via ORAL
  Filled 2015-10-14 (×3): qty 1

## 2015-10-14 MED ORDER — HYPROMELLOSE (GONIOSCOPIC) 2.5 % OP SOLN
1.0000 [drp] | Freq: Two times a day (BID) | OPHTHALMIC | Status: DC
  Administered 2015-10-14 – 2015-10-16 (×6): 1 [drp] via OPHTHALMIC
  Filled 2015-10-14: qty 15

## 2015-10-14 NOTE — Evaluation (Signed)
Physical Therapy Evaluation Patient Details Name: Ronald [name] MRN: [id number] DOB: [birth year] Today's Date: 10/14/2015   History of Present Illness  Pt adm with weakness and found to have UTI. PMH - suprapubic catheter, CAD status post CABG, ischemic cardiomyopathy, prostate cancer status post radiotherapy   Clinical Impression  Pt admitted with above diagnosis and presents to PT with functional limitations due to deficits listed below (See PT problem list). Pt needs skilled PT to maximize independence and safety to allow discharge to home with wife if pt progresses quickly. Assume his mobility will improve fairly rapidly with medical treatment.     Follow Up Recommendations Home health PT;Supervision/Assistance - 24 hour    Equipment Recommendations  Rolling walker with 5" wheels (if he doesn't already have)    Recommendations for Other Services       Precautions / Restrictions Precautions Precautions: Fall      Mobility  Bed Mobility Overal bed mobility: Needs Assistance Bed Mobility: Supine to Sit     Supine to sit: Mod assist     General bed mobility comments: Assist to elevate trunk and bring hips to EOB  Transfers Overall transfer level: Needs assistance Equipment used: Rolling walker (2 wheeled) Transfers: Sit to/from Stand Sit to Stand: +2 physical assistance;Mod assist         General transfer comment: Assist to bring hips up and to correct rt lean  Ambulation/Gait Ambulation/Gait assistance: +2 physical assistance;Mod assist Ambulation Distance (Feet): 15 Feet Assistive device: Rolling walker (2 wheeled) Gait Pattern/deviations: Step-through pattern;Decreased step length - right;Decreased step length - left;Shuffle Gait velocity: decr Gait velocity interpretation: <1.8 ft/sec, indicative of risk for recurrent falls General Gait Details: Pt with rt lean.  Stairs            Wheelchair Mobility    Modified Rankin (Stroke Patients Only)        Balance Overall balance assessment: Needs assistance Sitting-balance support: Feet supported;Bilateral upper extremity supported Sitting balance-Leahy Scale: Poor Sitting balance - Comments: min to mod A due to rt lean Postural control: Right lateral lean Standing balance support: Bilateral upper extremity supported Standing balance-Leahy Scale: Poor Standing balance comment: walker and mod A for static standing                             Pertinent Vitals/Pain Pain Assessment: No/denies pain    Home Living Family/patient expects to be discharged to:: Private residence Living Arrangements: Spouse/significant other Available Help at Discharge: Family Type of Home: House Home Access: Stairs to enter Entrance Stairs-Rails: Right Entrance Stairs-Number of Steps: 3 Home Layout: One level Home Equipment: Cane - single point;Shower seat;Grab bars - tub/shower;Grab bars - toilet;Walker - 2 wheels      Prior Function Level of Independence: Independent               Hand Dominance        Extremity/Trunk Assessment   Upper Extremity Assessment: Generalized weakness           Lower Extremity Assessment: Generalized weakness         Communication   Communication: HOH  Cognition Arousal/Alertness: Awake/alert Behavior During Therapy: WFL for tasks assessed/performed Overall Cognitive Status: No family/caregiver present to determine baseline cognitive functioning                      General Comments      Exercises  Assessment/Plan    PT Assessment Patient needs continued PT services  PT Diagnosis Difficulty walking;Generalized weakness   PT Problem List Decreased strength;Decreased activity tolerance;Decreased balance;Decreased mobility  PT Treatment Interventions DME instruction;Gait training;Functional mobility training;Therapeutic activities;Therapeutic exercise;Balance training;Patient/family education   PT Goals  (Current goals can be found in the Care Plan section) Acute Rehab PT Goals Patient Stated Goal: return home PT Goal Formulation: With patient Time For Goal Achievement: 10/21/15 Potential to Achieve Goals: Good    Frequency Min 3X/week   Barriers to discharge        Co-evaluation               End of Session Equipment Utilized During Treatment: Gait belt Activity Tolerance: Patient limited by fatigue Patient left: in chair;with call bell/phone within reach;with chair alarm set;with family/visitor present Nurse Communication: Mobility status    Functional Assessment Tool Used: clinical judgement Functional Limitation: Mobility: Walking and moving around Mobility: Walking and Moving Around Current Status [phone]): At least 40 percent but less than 60 percent impaired, limited or restricted Mobility: Walking and Moving Around Goal Status [phone]): At least 1 percent but less than 20 percent impaired, limited or restricted    Time: 0948-1008 PT Time Calculation (min) (ACUTE ONLY): 20 min   Charges:   PT Evaluation $PT Eval Moderate Complexity: 1 Procedure     PT G Codes:   PT G-Codes **NOT FOR INPATIENT CLASS** Functional Assessment Tool Used: clinical judgement Functional Limitation: Mobility: Walking and moving around Mobility: Walking and Moving Around Current Status [id number]): At least 40 percent but less than 60 percent impaired, limited or restricted Mobility: Walking and Moving Around Goal Status [phone]): At least 1 percent but less than 20 percent impaired, limited or restricted    [facility] 10/14/2015, 2:57 PM [facility] PT [phone]

## 2015-10-14 NOTE — Progress Notes (Signed)
CRITICAL VALUE ALERT  Critical value received:  Troponin 0.03  Date of notification:  10/14/15  Time of notification:  01:30  Critical value read back:Yes.    Nurse who received alert:  Francena Hanly  MD notified (1st page):  Schorr NP  No response

## 2015-10-14 NOTE — Care Management Obs Status (Signed)
[place] NOTIFICATION   Patient Details  Name: Ronald [name] MRN: [id number] Date of Birth: Jun 11, 1927   Medicare Observation Status Notification Given:  Yes    Erenest Rasher, RN 10/14/2015, 5:30 PM

## 2015-10-14 NOTE — Care Management Note (Signed)
Case Management Note  Patient Details  Name: KELVIN LUNDE MRN: [id number] Date of Birth: 04/10/1927  Subjective/Objective:      CHF, UTI, acute renal failure              Action/Plan: Discharge Planning: NCM spoke to pt at bedside. Offered choice for [facility]. States he had [facility] in the past. Will have NCM following pt on 10/15/2015 notify [facility] of new referral. Pt states he has cane at home. Lives at home with wife. Son or daughter available to take to appt his appts.   PCP Marletta Lor MD    Expected Discharge Date:                  Expected Discharge Plan:  [place]  In-House Referral:  NA  Discharge planning Services  CM Consult  Post Acute Care Choice:  Home Health Choice offered to:  Patient  DME Arranged:  N/A (has cane) DME Agency:  NA  HH Arranged:  RN [place] Agency:  [place]  Status of Service:  In process, will continue to follow  If discussed at Long Length of Stay Meetings, dates discussed:    Additional Comments:  Erenest Rasher, RN 10/14/2015, 5:32 PM

## 2015-10-14 NOTE — Progress Notes (Signed)
NURSING PROGRESS NOTE  Ronald [name] [id number] Admission Data: 10/14/2015 3:21 AM Attending Provider: Rise Patience, MD [name] [name], MD Code Status: full  Ronald [name] is a 82 y.o. male patient admitted from ED:  -No acute distress noted.  -No complaints of shortness of breath.  -No complaints of chest pain.   Cardiac Monitoring: Box # 13 in place. Cardiac monitor yields:normal sinus rhythm.  Blood pressure 145/75, pulse 68, temperature 98.1 F (36.7 C), temperature source Oral, resp. rate 20, SpO2 98 %.   IV Fluids:  IV in place, occlusive dsg intact without redness, IV cath forearm right, condition patent and no redness normal saline.   Allergies:  Pantoprazole; Nitrofurantoin; and Sulfa antibiotics  Past Medical History:   has a past medical history of Hyperlipidemia; S/P CABG (coronary artery bypass graft); History of Bell's palsy; OAB (overactive bladder); Chronic cystitis; Ischemic cardiomyopathy; Coronary artery disease (CARDIOLOGIST-  DR Angelena Form); Wears hearing aid; Wears partial dentures; At risk for sleep apnea; GERD (gastroesophageal reflux disease); Mild dementia; Kidney stones ("years ago"); Hypertension; Myocardial infarction ([place]) (1986; 1997); Arthritis; CHF (congestive heart failure) ([place]); Skipped heart beats; dizziness; Diverticulosis; Urethral stricture; Bladder calculi; Nocturia; Prostate cancer ([place]) (1998); and Melanoma of ear ([place]).  Past Surgical History:   has past surgical history that includes EUS (10/05/2011); Tonsillectomy and adenoidectomy (1954); Laparoscopic cholecystectomy (12-23-2005); RADIOACTIVE PROSTATE SEED IMPLANTS (1998); Cardiovascular stress test (08-01-2011  dr Angelena Form); Cataract extraction w/ intraocular lens  implant, bilateral (Bilateral, 2007); Cystoscopy with urethral dilatation (N/A, 12/25/2013); Melanoma excision (X 1); Cardiac catheterization (02-22-2005  dr Vidal Schwalbe); Cardiac catheterization (08-28-2013  DR Daneen Schick); Skin cancer excision (X 2); left heart catheterization with coronary/graft angiogram (N/A, 08/28/2013); Coronary artery bypass graft (11/ 1989  &  10/ 1997); Cystoscopy with urethral dilatation (N/A, 12/22/2014); and Insertion of suprapubic catheter (N/A, 12/22/2014).  Social History:   reports that he quit smoking about 31 years ago. His smoking use included Cigars. He quit smokeless tobacco use about 2 years ago. His smokeless tobacco use included Chew. He reports that he does not drink alcohol or use illicit drugs.  Skin: intact, clean and dry  Patient/Family orientated to room. Information packet given to patient/family. Admission inpatient armband information verified with patient/family to include name and date of birth and placed on patient arm. Side rails up x 2, fall assessment and education completed with patient/family. Patient/family able to verbalize understanding of risk associated with falls and verbalized understanding to call for assistance before getting out of bed. Call light within reach. Patient/family able to voice and demonstrate understanding of unit orientation instructions.    Will continue to evaluate and treat per MD orders.

## 2015-10-14 NOTE — Progress Notes (Signed)
PROGRESS NOTE    Ronald [name]  [id number] DOB: 20-Apr-1927 DOA: 10/13/2015 PCP: Nyoka Cowden, MD   Brief Narrative: Ronald [name] is a 82 y.o. 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  Assessment & Plan:   Principal Problem:   Generalized weakness Active Problems:   Chronic systolic CHF (congestive heart failure) (HCC)   UTI (lower urinary tract infection)   ARF (acute renal failure) (HCC)   Generalized weakness probably from UTI from chronic indwelling supra pubic catheter: Currently on fortaz Get urine cultures and exchange supra pubic catheter.  Called Dr Alinda Money on call urology, suggested we call RN for catheter exchange.    Chronic systolic heart failure : appears to be compensated.  No signs of heart failure.    Acute kidney injury: Probably from UTI and dehydration.  Monitor renal parameters .  Gentle hydration.    Chronic stable thrombocytopenia: Stable, d/c heparin and start scd's/    Leukocytosis fprobably from infection.   Hypertension:  Well controlled.    Hyperlipidemia: Resume statin.         DVT prophylaxis: scd's Code Status: (Full) Family Communication: discussed with wife at bedside.  Disposition Plan: pending further eval.    Consultants:   Discussed with Dr Alinda Money from urology.    Procedures: none   Antimicrobials: fortaz 7/18   Subjective: No new complaints wwanted to know if we can exchange the supra pubic catheter.  Objective: Filed Vitals:   10/13/15 2326 10/14/15 0609 10/14/15 1138 10/14/15 1556  BP: 145/75 121/67 110/52 97/58  Pulse:  71 60 56  Temp: 98.1 F (36.7 C) 98.6 F (37 C) 98.8 F (37.1 C) 98.6 F (37 C)  TempSrc: Oral Oral Oral Oral  Resp: 20 20 18 18   Weight:  69.174 kg (152 lb 8 oz)     SpO2: 98% 94% 94% 95%    Intake/Output Summary (Last 24 hours) at 10/14/15 1632 Last data filed at 10/14/15 1606  Gross per 24 hour  Intake 928.75 ml  Output   1326 ml  Net -397.25 ml   Filed Weights   10/14/15 0609  Weight: 69.174 kg (152 lb 8 oz)    Examination:  General exam: Appears calm and comfortable  Respiratory system: Clear to auscultation. Respiratory effort normal. Cardiovascular system: S1 & S2 heard, RRR.  Gastrointestinal system: Abdomen is nondistended, soft and mild ly tender around the supra pubic catheter. . No organomegaly or masses felt. Normal bowel sounds heard. SUPRA PUBIC CATHETER present.  Central nervous system: Alert and oriented. No focal neurological deficits. Extremities: Symmetric 5 x 5 power.     Data Reviewed: I have personally reviewed following labs and imaging studies  CBC:  Recent Labs Lab 10/13/15 1835 10/14/15 0009 10/14/15 0506  WBC 16.7* 15.2* 14.4*  NEUTROABS 14.1*  --   --   HGB 15.1 14.9 13.7  HCT 45.5 45.3 41.4  MCV 92.9 93.0 92.8  PLT 105* 106* 86*   Basic Metabolic Panel:  Recent Labs Lab 10/13/15 1835 10/14/15 0009 10/14/15 0506  NA 135  --  136  K 4.2  --  4.2  CL 101  --  104  CO2 27  --  26  GLUCOSE 141*  --  122*  BUN 21*  --  22*  CREATININE 1.44* 1.28* 1.44*  CALCIUM 9.4  --  8.8*  GFR: CrCl cannot be calculated (Unknown ideal weight.). Liver Function Tests:  Recent Labs Lab 10/13/15 1835 10/14/15 0506  AST 29 25  ALT 22 19  ALKPHOS 71 62  BILITOT 1.8* 1.3*  PROT 6.9 6.0*  ALBUMIN 4.1 3.4*   No results for input(s): LIPASE, AMYLASE in the last 168 hours. No results for input(s): AMMONIA in the last 168 hours. Coagulation Profile: No results for input(s): INR, PROTIME in the last 168 hours. Cardiac Enzymes:  Recent Labs Lab 10/14/15 0009 10/14/15 0506 10/14/15 1033  CKTOTAL 98  --   --   TROPONINI 0.03* 0.05* 0.03*   BNP (last 3 results) No results for input(s): PROBNP in  the last 8760 hours. HbA1C: No results for input(s): HGBA1C in the last 72 hours. CBG: No results for input(s): GLUCAP in the last 168 hours. Lipid Profile: No results for input(s): CHOL, HDL, LDLCALC, TRIG, CHOLHDL, LDLDIRECT in the last 72 hours. Thyroid Function Tests:  Recent Labs  10/14/15 0008  TSH 1.784   Anemia Panel: No results for input(s): VITAMINB12, FOLATE, FERRITIN, TIBC, IRON, RETICCTPCT in the last 72 hours. Sepsis Labs:  Recent Labs Lab 10/13/15 1844  LATICACIDVEN 1.21    Recent Results (from the past 240 hour(s))  Urine culture     Status: None (Preliminary result)   Collection Time: 10/13/15  7:26 PM  Result Value Ref Range Status   Specimen Description URINE, RANDOM  Final   Special Requests PED BAG  Final   Culture PENDING  Incomplete   Report Status PENDING  Incomplete         Radiology Studies: Dg Chest Portable 1 View  10/13/2015  CLINICAL DATA:  Fever EXAM: PORTABLE CHEST 1 VIEW COMPARISON:  December 04, 2014 FINDINGS: There is patchy opacity in the left base, also present on prior study, likely due to scarring. Lungs elsewhere are clear. Heart is borderline enlarged with pulmonary vascularity within normal limits. No adenopathy. Patient is status post coronary artery bypass grafting. No bone lesions. IMPRESSION: Scarring left base. Subtle superimposed pneumonia in this area cannot be entirely excluded. Lungs elsewhere clear. Stable cardiac silhouette. Electronically Signed   By: Lowella Grip III M.D.   On: 10/13/2015 21:16        Scheduled Meds: . aspirin EC  81 mg Oral Daily  . atorvastatin  40 mg Oral QPM  . cefTAZidime (FORTAZ)  IV  2 g Intravenous Q12H  . cholecalciferol  2,000 Units Oral Daily  . galantamine  8 mg Oral Q breakfast  . heparin  5,000 Units Subcutaneous Q8H  . hydroxypropyl methylcellulose / hypromellose  1 drop Both Eyes BID  . isosorbide mononitrate  30 mg Oral q morning - 10a  . multivitamin with minerals  1  tablet Oral Daily   Continuous Infusions:       Time spent: 30 MINUTES.     Hosie Poisson, MD [facility] Pager [phone]   If 7PM-7AM, please contact night-coverage [email or web address] Password [facility] 10/14/2015, 4:32 PM

## 2015-10-14 NOTE — Progress Notes (Signed)
Contacted 4W suprapubic catheter change team to have staff member come to bedside to change pt catheter. 4W will see which staff can come to floor to change catheter. Will continue to monitor patient.

## 2015-10-14 NOTE — Progress Notes (Signed)
Pharmacy Antibiotic Note  Ronald [name] is a 82 y.o. male admitted on 10/13/2015 with UTI.  Pharmacy has been consulted for Ceftazidime dosing. Pt has hx of Pseudomonas and E Coli in the Urine (?active infection vs colonization). WBC is elevated this admit. Mild bump in Scr.   Plan: -Ceftazidime 2g IV q12h -Trend WBC, temp, renal function  -F/U urine culture  Temp (24hrs), Avg:98.4 F (36.9 C), Min:98.1 F (36.7 C), Max:98.9 F (37.2 C)   Recent Labs Lab 10/13/15 1835 10/13/15 1844  WBC 16.7*  --   CREATININE 1.44*  --   LATICACIDVEN  --  1.21    CrCl cannot be calculated (Unknown ideal weight.).    Allergies  Allergen Reactions  . Pantoprazole Other (See Comments)    Headache and lightheaded  . Nitrofurantoin Hives  . Sulfa Antibiotics Hives and Itching     [place], G[place] 10/14/2015 12:08 AM

## 2015-10-15 LAB — BASIC METABOLIC PANEL
Anion gap: 4 — ABNORMAL LOW (ref 5–15)
BUN: 21 mg/dL — ABNORMAL HIGH (ref 6–20)
CO2: 26 mmol/L (ref 22–32)
Calcium: 8.4 mg/dL — ABNORMAL LOW (ref 8.9–10.3)
Chloride: 107 mmol/L (ref 101–111)
Creatinine, Ser: 1.21 mg/dL (ref 0.61–1.24)
GFR calc Af Amer: >60 mL/min (ref >60)
GFR calc non Af Amer: 52 mL/min — ABNORMAL LOW (ref >60)
Glucose, Bld: 113 mg/dL — ABNORMAL HIGH (ref 65–99)
Potassium: 3.5 mmol/L (ref 3.5–5.1)
Sodium: 137 mmol/L (ref 135–145)

## 2015-10-15 NOTE — Progress Notes (Signed)
PROGRESS NOTE    Ronald [name]  [id number] DOB: 19-Feb-1928 DOA: 10/13/2015 PCP: Nyoka Cowden, MD   Brief Narrative: Ronald [name] is a 82 y.o. 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  Assessment & Plan:   Principal Problem:   Generalized weakness Active Problems:   Chronic systolic CHF (congestive heart failure) (HCC)   UTI (lower urinary tract infection)   ARF (acute renal failure) (HCC)   Generalized weakness probably from UTI from chronic indwelling supra pubic catheter: Currently on fortaz Get urine cultures and exchange supra pubic catheter.  Urine cultures show e coli and gram positive cocci. Sensitivities are pending.  Resume IV fortaz.  Get PT eval     Chronic systolic heart failure : appears to be compensated.  No signs of heart failure.    Acute kidney injury: Probably from UTI and dehydration.  Monitor renal parameters .  Gentle hydration.  Renal parameters improved.    Chronic stable thrombocytopenia: Stable, d/c heparin and start scd's/  Repeat platelets in am.    Leukocytosis probably from infection. Repeat CBC in am.   Hypertension:  Well controlled.    Hyperlipidemia: Resume statin.         DVT prophylaxis: scd's Code Status: (Full) Family Communication: discussed with wife at bedside.  Disposition Plan: pending further eval.    Consultants:   Discussed with Dr Alinda Money from urology.    Procedures: none   Antimicrobials: fortaz 7/18   Subjective: Reports he has trouble sleeping. No new complaints.  Objective: Filed Vitals:   10/15/15 0225 10/15/15 0500 10/15/15 1204 10/15/15 1400  BP: 99/62  115/52   Pulse: 89  56   Temp: 98.3 F (36.8 C)  98.2 F (36.8 C)   TempSrc: Oral  Oral   Resp: 17  18   Height:     5\' 5"  (1.651 m)  Weight:  69.763 kg (153 lb 12.8 oz)    SpO2: 95%  98%     Intake/Output Summary (Last 24 hours) at 10/15/15 1822 Last data filed at 10/15/15 1300  Gross per 24 hour  Intake   1660 ml  Output   1700 ml  Net    -40 ml   Filed Weights   10/14/15 0609 10/15/15 0500  Weight: 69.174 kg (152 lb 8 oz) 69.763 kg (153 lb 12.8 oz)    Examination:  General exam: Appears calm and comfortable  Respiratory system: Clear to auscultation. Respiratory effort normal. Cardiovascular system: S1 & S2 heard, RRR.  Gastrointestinal system: Abdomen is nondistended, soft and mild ly tender around the supra pubic catheter. . No organomegaly or masses felt. Normal bowel sounds heard. SUPRA PUBIC CATHETER present.  Central nervous system: Alert and oriented. No focal neurological deficits. Extremities: Symmetric 5 x 5 power.     Data Reviewed: I have personally reviewed following labs and imaging studies  CBC:  Recent Labs Lab 10/13/15 1835 10/14/15 0009 10/14/15 0506  WBC 16.7* 15.2* 14.4*  NEUTROABS 14.1*  --   --   HGB 15.1 14.9 13.7  HCT 45.5 45.3 41.4  MCV 92.9 93.0 92.8  PLT 105* 106* 86*   Basic Metabolic Panel:  Recent Labs Lab 10/13/15 1835 10/14/15 0009 10/14/15 0506 10/15/15 0345  NA 135  --  136 137  K 4.2  --  4.2 3.5  CL 101  --  104 107  CO2 27  --  26 26  GLUCOSE 141*  --  122* 113*  BUN 21*  --  22* 21*  CREATININE 1.44* 1.28* 1.44* 1.21  CALCIUM 9.4  --  8.8* 8.4*   GFR: Estimated Creatinine Clearance: 38.1 mL/min (by C-G formula based on Cr of 1.21). Liver Function Tests:  Recent Labs Lab 10/13/15 1835 10/14/15 0506  AST 29 25  ALT 22 19  ALKPHOS 71 62  BILITOT 1.8* 1.3*  PROT 6.9 6.0*  ALBUMIN 4.1 3.4*   No results for input(s): LIPASE, AMYLASE in the last 168 hours. No results for input(s): AMMONIA in the last 168 hours. Coagulation Profile: No results for input(s): INR, PROTIME in the last 168 hours. Cardiac  Enzymes:  Recent Labs Lab 10/14/15 0009 10/14/15 0506 10/14/15 1033  CKTOTAL 98  --   --   TROPONINI 0.03* 0.05* 0.03*   BNP (last 3 results) No results for input(s): PROBNP in the last 8760 hours. HbA1C: No results for input(s): HGBA1C in the last 72 hours. CBG: No results for input(s): GLUCAP in the last 168 hours. Lipid Profile: No results for input(s): CHOL, HDL, LDLCALC, TRIG, CHOLHDL, LDLDIRECT in the last 72 hours. Thyroid Function Tests:  Recent Labs  10/14/15 0008  TSH 1.784   Anemia Panel: No results for input(s): VITAMINB12, FOLATE, FERRITIN, TIBC, IRON, RETICCTPCT in the last 72 hours. Sepsis Labs:  Recent Labs Lab 10/13/15 1844  LATICACIDVEN 1.21    Recent Results (from the past 240 hour(s))  Urine culture     Status: Abnormal (Preliminary result)   Collection Time: 10/13/15  7:26 PM  Result Value Ref Range Status   Specimen Description URINE, RANDOM  Final   Special Requests PED BAG  Final   Culture (A)  Final    >=100,000 COLONIES/mL ESCHERICHIA COLI >=100,000 COLONIES/mL GRAM POSITIVE COCCI    Report Status PENDING  Incomplete  Culture, blood (routine x 2)     Status: None (Preliminary result)   Collection Time: 10/14/15 12:17 AM  Result Value Ref Range Status   Specimen Description BLOOD RIGHT ARM  Final   Special Requests BOTTLES DRAWN AEROBIC AND ANAEROBIC 5ML  Final   Culture NO GROWTH 1 DAY  Final   Report Status PENDING  Incomplete  Culture, blood (routine x 2)     Status: None (Preliminary result)   Collection Time: 10/14/15 12:24 AM  Result Value Ref Range Status   Specimen Description BLOOD LEFT ARM  Final   Special Requests BOTTLES DRAWN AEROBIC AND ANAEROBIC 5ML  Final   Culture NO GROWTH 1 DAY  Final   Report Status PENDING  Incomplete         Radiology Studies: US Renal  10/14/2015  CLINICAL DATA:  Acute renal failure. EXAM: RENAL / URINARY TRACT ULTRASOUND COMPLETE COMPARISON:  CT of the abdomen and pelvis with contrast  10/05/2014 FINDINGS: Right Kidney: Length: 11.1 cm, within normal limits. An anechoic cyst with increased through transmission is present at the right upper pole measuring 5.0 x 5.0 x 4.8 cm. There is thinning of the renal cortex with increased echogenicity relative to the index organ. No focal lesion or hydronephrosis is present. Left Kidney: Length: 12.8 cm, within normal limits. There is thinning of the a normal renal parenchyma. The parenchyma is somewhat echogenic. No focal stone or mass lesion is present. There is no hydronephrosis. Bladder: Appears normal for degree of bladder distention. IMPRESSION: 1. Thinning of the renal parenchyma with increased echogenicity  bilaterally compatible with medical renal disease. 2. No obstruction or solid mass lesion. 3. 5 cm simple cyst at the upper pole of the right kidney. Electronically Signed   By: San Morelle M.D.   On: 10/14/2015 18:58   Dg Chest Portable 1 View  10/13/2015  CLINICAL DATA:  Fever EXAM: PORTABLE CHEST 1 VIEW COMPARISON:  December 04, 2014 FINDINGS: There is patchy opacity in the left base, also present on prior study, likely due to scarring. Lungs elsewhere are clear. Heart is borderline enlarged with pulmonary vascularity within normal limits. No adenopathy. Patient is status post coronary artery bypass grafting. No bone lesions. IMPRESSION: Scarring left base. Subtle superimposed pneumonia in this area cannot be entirely excluded. Lungs elsewhere clear. Stable cardiac silhouette. Electronically Signed   By: Lowella Grip III M.D.   On: 10/13/2015 21:16        Scheduled Meds: . aspirin EC  81 mg Oral Daily  . atorvastatin  40 mg Oral QPM  . cefTAZidime (FORTAZ)  IV  2 g Intravenous Q12H  . cholecalciferol  2,000 Units Oral Daily  . galantamine  8 mg Oral Q breakfast  . hydroxypropyl methylcellulose / hypromellose  1 drop Both Eyes BID  . isosorbide mononitrate  30 mg Oral q morning - 10a  . multivitamin with minerals  1  tablet Oral Daily   Continuous Infusions: . sodium chloride 50 mL/hr at 10/14/15 1724     LOS: 1 day    Time spent: 30 MINUTES.     Hosie Poisson, MD [facility] Pager [phone]   If 7PM-7AM, please contact night-coverage [email or web address] Password [facility] 10/15/2015, 6:22 PM

## 2015-10-15 NOTE — Progress Notes (Signed)
Called 4W Suprapubic Catheter Change team to request staff member to come change pt catheter today.

## 2015-10-15 NOTE — Progress Notes (Signed)
Patient episode of asymsptomatic bradycardia on monitor with less than 2 second pause.  Noted history of skipped heart beats.  VSS on patient arousal.  Patient denies any distress or disturbance at this time.

## 2015-10-15 NOTE — Progress Notes (Signed)
Paged Dr. Karleen Hampshire regarding pt having 11 bt run vtach at [employer]. Pt walking to bathroom when it occurred and asymptomatic. Will continue to monitor pt.       Maurene Capes RN

## 2015-10-15 NOTE — Care Management Important Message (Signed)
Important Message  Patient Details  Name: Ronald [name] MRN: [id number] Date of Birth: Sep 19, 1927   Medicare Important Message Given:  Yes    Kerem Gilmer, Leroy Sea 10/15/2015, 1:31 PM

## 2015-10-15 NOTE — Progress Notes (Signed)
   10/15/15 1000  Clinical Encounter Type  Visited With Patient;Patient and family together  Visit Type Initial  Referral From Nurse  Consult/Referral To Chaplain  Spiritual Encounters  Spiritual Needs Prayer;Emotional  Stress Factors  Patient Stress Factors Health changes  [facility] responded to [facility] request for prayer. [facility]P provided presences, emotional support. Roe Coombs 10/15/2015

## 2015-10-16 LAB — BASIC METABOLIC PANEL
Anion gap: 4 — ABNORMAL LOW (ref 5–15)
BUN: 20 mg/dL (ref 6–20)
CO2: 25 mmol/L (ref 22–32)
Calcium: 8.2 mg/dL — ABNORMAL LOW (ref 8.9–10.3)
Chloride: 108 mmol/L (ref 101–111)
Creatinine, Ser: 1.09 mg/dL (ref 0.61–1.24)
GFR calc Af Amer: >60 mL/min (ref >60)
GFR calc non Af Amer: 59 mL/min — ABNORMAL LOW (ref >60)
Glucose, Bld: 113 mg/dL — ABNORMAL HIGH (ref 65–99)
Potassium: 3.8 mmol/L (ref 3.5–5.1)
Sodium: 137 mmol/L (ref 135–145)

## 2015-10-16 LAB — CBC
HCT: 37.2 % — ABNORMAL LOW (ref 39.0–52.0)
Hemoglobin: 12 g/dL — ABNORMAL LOW (ref 13.0–17.0)
MCH: 29.9 pg (ref 26.0–34.0)
MCHC: 32.3 g/dL (ref 30.0–36.0)
MCV: 92.5 fL (ref 78.0–100.0)
Platelets: 84 10*3/uL — ABNORMAL LOW (ref 150–400)
RBC: 4.02 MIL/uL — ABNORMAL LOW (ref 4.22–5.81)
RDW: 13.3 % (ref 11.5–15.5)
WBC: 5.2 10*3/uL (ref 4.0–10.5)

## 2015-10-16 LAB — MAGNESIUM: Magnesium: 2 mg/dL (ref 1.7–2.4)

## 2015-10-16 LAB — URINE CULTURE: Culture: >=100000 — AB

## 2015-10-16 MED ORDER — LEVOFLOXACIN 750 MG PO TABS: 750.0000 mg | ORAL_TABLET | ORAL | Status: DC

## 2015-10-16 MED ORDER — LEVOFLOXACIN 750 MG PO TABS
750.0000 mg | ORAL_TABLET | ORAL | Status: DC
  Administered 2015-10-16: 750 mg via ORAL
  Filled 2015-10-16: qty 1

## 2015-10-16 NOTE — Progress Notes (Signed)
Pharmacy Antibiotic Note  Ronald [name] is a 82 y.o. male admitted on 10/13/2015 with UTI.  Pharmacy has been consulted for levofloxacin dosing. Pt previously treated with ceftriaxone. Urine culture results with E. Coli, E. faecalis. Renal fuction improving, CrCl 42 ml/min. Pt allergic to sulfa ABX and nitrofurantion.   Plan: D/c ceftriaxone  Start levofloxacin 750 mg PO q48h - dosed at 1200 to separate from MVI Monitor renal function, CBC, WBC, clinical picture.   Height: 5\' 5"  (165.1 cm) Weight: 156 lb 1.6 oz (70.806 kg) (scale a) IBW/kg (Calculated) : 61.5  Temp (24hrs), Avg:98.8 F (37.1 C), Min:98.2 F (36.8 C), Max:99.2 F (37.3 C)   Recent Labs Lab 10/13/15 1835 10/13/15 1844 10/14/15 0009 10/14/15 0506 10/15/15 0345 10/16/15 0257  WBC 16.7*  --  15.2* 14.4*  --  5.2  CREATININE 1.44*  --  1.28* 1.44* 1.21 1.09  LATICACIDVEN  --  1.21  --   --   --   --     Estimated Creatinine Clearance: 42.3 mL/min (by C-G formula based on Cr of 1.09).    Allergies  Allergen Reactions  . Pantoprazole Other (See Comments)    Headache and lightheaded  . Nitrofurantoin Hives  . Sulfa Antibiotics Hives and Itching    Antimicrobials this admission: Ceftriaxone 7/19 >> 7/21 Levofloxacin 7/21 >>   Dose adjustments this admission: none  Microbiology results: 7/19 BCx: NGTD 7/19 UCx: E Coli, E faecalis   Thank you for allowing pharmacy to be a part of this patient's care.  Carlean Jews, Pharm.D. PGY1 Pharmacy Resident 10/16/2015 10:45 AM

## 2015-10-16 NOTE — Care Management Note (Signed)
Case Management Note  Patient Details  Name: Ronald [name] MRN: [id number] Date of Birth: 08/13/1927  Subjective/Objective:      Patient is set up with [facility] for HHRN/HHHPT, also will have a rolling walker  Thru [facility].  No further needs.               Action/Plan:   Expected Discharge Date:                  Expected Discharge Plan:  [place]  In-House Referral:  NA  Discharge planning Services  CM Consult  Post Acute Care Choice:  Home Health Choice offered to:  Patient  DME Arranged:  Walker rolling (has cane) DME Agency:  NA, [place] Arranged:  RN, PT [facility] Agency:  [place]  Status of Service:  Completed, signed off  If discussed at [place] of Stay Meetings, dates discussed:    Additional Comments:  Zenon Mayo, RN 10/16/2015, 11:25 AM

## 2015-10-16 NOTE — Progress Notes (Signed)
Pt has orders to be discharged. Discharge instructions given and pt has no additional questions at this time. Medication regimen reviewed and pt educated. Pt verbalized understanding and has no additional questions. Telemetry box removed. IV removed and site in good condition. Pt stable and waiting for transportation.   Marija Calamari RN 

## 2015-10-16 NOTE — Progress Notes (Signed)
Physical Therapy Treatment Patient Details Name: Ronald [name] MRN: [id number] DOB: 01/07/1928 Today's Date: 10/16/2015    History of Present Illness Pt adm with weakness and found to have UTI. PMH - suprapubic catheter, CAD status post CABG, ischemic cardiomyopathy, prostate cancer status post radiotherapy     PT Comments    Mr. Gines made excellent progress today, ambulating 300 ft with min guard assist for safety.  He completed stair training with min guard assist as well.  Pt will benefit from continued skilled PT services to increase functional independence and safety.   Follow Up Recommendations  Home health PT;Supervision/Assistance - 24 hour     Equipment Recommendations  Rolling walker with 5" wheels    Recommendations for Other Services       Precautions / Restrictions Precautions Precautions: Fall Restrictions Weight Bearing Restrictions: No    Mobility  Bed Mobility Overal bed mobility: Needs Assistance Bed Mobility: Supine to Sit     Supine to sit: Min guard;HOB elevated     General bed mobility comments: Increased time  Transfers Overall transfer level: Needs assistance Equipment used: Rolling walker (2 wheeled) Transfers: Sit to/from Stand Sit to Stand: Min guard         General transfer comment: Cues for hand placement and to keep RW with him when turning to sit.  Ambulation/Gait Ambulation/Gait assistance: Min guard Ambulation Distance (Feet): 300 Feet Assistive device: Rolling walker (2 wheeled) Gait Pattern/deviations: Step-through pattern;Decreased stride length;Trunk flexed Gait velocity: decreased   General Gait Details: Flexed posture with cues for upright posture and to walk closer to RW.  Min guard assist for safety.   Stairs Stairs: Yes Stairs assistance: Min guard Stair Management: One rail Right;Step to pattern;Alternating pattern;Forwards Number of Stairs: 4 General stair comments: Cues for step to pattern for improved  balance.  Min guard for safety.  Wheelchair Mobility    Modified Rankin (Stroke Patients Only)       Balance Overall balance assessment: Needs assistance Sitting-balance support: No upper extremity supported;Feet supported Sitting balance-Leahy Scale: Good     Standing balance support: Bilateral upper extremity supported;During functional activity Standing balance-Leahy Scale: Poor Standing balance comment: Relies on support from RW                    Cognition Arousal/Alertness: Awake/alert Behavior During Therapy: WFL for tasks assessed/performed Overall Cognitive Status: No family/caregiver present to determine baseline cognitive functioning                      Exercises Other Exercises Other Exercises: Manually resisted knee flexion extension in sitting x5 reps Other Exercises: Sit<>stand from chair x5 with use of arm rests    General Comments        Pertinent Vitals/Pain Pain Assessment: No/denies pain ("I just feel weak")    Home Living                      Prior Function            PT Goals (current goals can now be found in the care plan section) Acute Rehab PT Goals Patient Stated Goal: return home PT Goal Formulation: With patient Time For Goal Achievement: 10/21/15 Potential to Achieve Goals: Good Progress towards PT goals: Progressing toward goals    Frequency  Min 3X/week    PT Plan Current plan remains appropriate    Co-evaluation  End of Session Equipment Utilized During Treatment: Gait belt Activity Tolerance: Patient limited by fatigue;Patient tolerated treatment well Patient left: in chair;with call bell/phone within reach;with chair alarm set;with family/visitor present     Time: [id number] PT Time Calculation (min) (ACUTE ONLY): 22 min  Charges:  $Gait Training: 8-22 mins                    G Codes:      Collie Siad PT, DPT  Pager: [phone] Phone: [phone] 10/16/2015, 1:30  PM

## 2015-10-16 NOTE — Care Management Important Message (Signed)
Important Message  Patient Details  Name: Ronald [name] MRN: [id number] Date of Birth: May 31, 1927   Medicare Important Message Given:  Yes    [name] 10/16/2015, 8:18 AM

## 2015-10-19 DIAGNOSIS — K219 Gastro-esophageal reflux disease without esophagitis: Secondary | ICD-10-CM | POA: Diagnosis not present

## 2015-10-19 DIAGNOSIS — Z9181 History of falling: Secondary | ICD-10-CM | POA: Diagnosis not present

## 2015-10-19 DIAGNOSIS — Z951 Presence of aortocoronary bypass graft: Secondary | ICD-10-CM | POA: Diagnosis not present

## 2015-10-19 DIAGNOSIS — I5022 Chronic systolic (congestive) heart failure: Secondary | ICD-10-CM | POA: Diagnosis not present

## 2015-10-19 DIAGNOSIS — Z435 Encounter for attention to cystostomy: Secondary | ICD-10-CM | POA: Diagnosis not present

## 2015-10-19 DIAGNOSIS — M150 Primary generalized (osteo)arthritis: Secondary | ICD-10-CM | POA: Diagnosis not present

## 2015-10-19 DIAGNOSIS — Z7982 Long term (current) use of aspirin: Secondary | ICD-10-CM | POA: Diagnosis not present

## 2015-10-19 DIAGNOSIS — N3020 Other chronic cystitis without hematuria: Secondary | ICD-10-CM | POA: Diagnosis not present

## 2015-10-19 DIAGNOSIS — I110 Hypertensive heart disease with heart failure: Secondary | ICD-10-CM | POA: Diagnosis not present

## 2015-10-19 DIAGNOSIS — T83511D Infection and inflammatory reaction due to indwelling urethral catheter, subsequent encounter: Secondary | ICD-10-CM | POA: Diagnosis not present

## 2015-10-19 DIAGNOSIS — Z87891 Personal history of nicotine dependence: Secondary | ICD-10-CM | POA: Diagnosis not present

## 2015-10-19 DIAGNOSIS — I2510 Atherosclerotic heart disease of native coronary artery without angina pectoris: Secondary | ICD-10-CM | POA: Diagnosis not present

## 2015-10-19 DIAGNOSIS — Z8546 Personal history of malignant neoplasm of prostate: Secondary | ICD-10-CM | POA: Diagnosis not present

## 2015-10-19 DIAGNOSIS — K5790 Diverticulosis of intestine, part unspecified, without perforation or abscess without bleeding: Secondary | ICD-10-CM | POA: Diagnosis not present

## 2015-10-19 DIAGNOSIS — I255 Ischemic cardiomyopathy: Secondary | ICD-10-CM | POA: Diagnosis not present

## 2015-10-19 DIAGNOSIS — F0390 Unspecified dementia without behavioral disturbance: Secondary | ICD-10-CM | POA: Diagnosis not present

## 2015-10-19 LAB — CULTURE, BLOOD (ROUTINE X 2)
Culture: NO GROWTH
Culture: NO GROWTH

## 2015-10-19 NOTE — Discharge Summary (Signed)
Physician Discharge Summary  Ronald [name] [id number] DOB: [birth year] DOA: 10/13/2015  PCP: Nyoka Cowden, MD  Admit date: 10/13/2015 Discharge date: 10/16/2015  Admitted From: Home Disposition:  Home   Recommendations for Outpatient Follow-up:  1. Follow up with PCP in 1-2 weeks 2. Please obtain BMP/CBC in one week 3. Please follow up on the following pending results:  Home Health:yes  Discharge Condition:stable.  CODE STATUS:full code.  Diet recommendation: Heart Healthy   Brief/Interim Summary: Ronald [name] is a 82 y.o. 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 He was found to have E coli UTI.   Discharge Diagnoses:  Principal Problem:   Generalized weakness Active Problems:   Chronic systolic CHF (congestive heart failure) (HCC)   UTI (lower urinary tract infection)   ARF (acute renal failure) (HCC)  Generalized weakness probably from UTI from chronic indwelling supra pubic catheter: Started  On iV fortaz, urine cultures done and exchange supra pubic catheter.  Urine cultures show e coli and enterococcus fecalis.  Discharged on oral antibiotics to complete the course.     Chronic systolic heart failure : appears to be compensated.  No signs of heart failure.    Acute kidney injury: Probably from UTI and dehydration.  Monitor renal parameters .  Gentle hydration.  Renal parameters improved.    Chronic stable thrombocytopenia: Stable, d/c heparin and start scd's/  Repeat platelets stabilized.    Leukocytosis probably from infection. Repeat CBC in am shows improvement.   Hypertension:  Well controlled.    Hyperlipidemia: Resume statin.   Discharge Instructions  Discharge Instructions    Diet general    Complete by:  As directed    Discharge instructions    Complete by:  As directed   Follow up with PCP and Urology in 1 to 2 weeks.       Medication List    TAKE these medications   acetaminophen 500 MG tablet Commonly known as:  TYLENOL Take 500 mg by mouth every 6 (six) hours as needed for pain or fever.   aspirin 81 MG EC tablet Take 1 tablet (81 mg total) by mouth daily.   atorvastatin 80 MG tablet Commonly known as:  LIPITOR Take 40 mg by mouth every evening.   calcium carbonate 500 MG chewable tablet Commonly known as:  TUMS - dosed in mg elemental calcium Chew 2 tablets by mouth as needed for indigestion or heartburn.   flavoxATE 100 MG tablet Commonly known as:  URISPAS Take 100 mg by mouth 3 (three) times daily as needed for bladder spasms.   galantamine 8 MG 24 hr capsule Commonly known as:  RAZADYNE ER Take 8 mg by mouth daily with breakfast.   hydroxypropyl methylcellulose / hypromellose 2.5 % ophthalmic solution Commonly known as:  ISOPTO TEARS / GONIOVISC Place 1 drop into both eyes 2 (two) times daily.   isosorbide mononitrate 60 MG 24 hr tablet Commonly known as:  IMDUR Take 0.5 tablets (30 mg total) by mouth every morning.   levofloxacin 750 MG tablet Commonly known as:  LEVAQUIN Take 1 tablet (750 mg total) by mouth every other day.   multivitamin with minerals Tabs tablet Take 1 tablet by mouth daily.   nitroGLYCERIN 0.4 MG SL tablet Commonly known as:  NITROSTAT Place 0.4 mg under the tongue every 5 (five) minutes as needed for chest  pain.   phenazopyridine 200 MG tablet Commonly known as:  PYRIDIUM Take 200 mg by mouth 3 (three) times daily as needed for pain.   Vitamin D 2000 units tablet Take 2,000 Units by mouth daily.      Follow-up Information    [facility].   Why:  Home Health RN and physical therapy Contact information: [address] [place] [address] [phone]        [facility] - [facility].   Why:  rolling  walker Contact information: [place] [address] [phone]        Nyoka Cowden, MD. Go on 10/26/2015.   Specialty:  Internal Medicine Why:  @ 11:15 Contact information: [place] [address] [phone]          Allergies  Allergen Reactions  . Pantoprazole Other (See Comments)    Headache and lightheaded  . Nitrofurantoin Hives  . Sulfa Antibiotics Hives and Itching    Consultations:  none   Procedures/Studies: US Renal  Result Date: 10/14/2015 CLINICAL DATA:  Acute renal failure. EXAM: RENAL / URINARY TRACT ULTRASOUND COMPLETE COMPARISON:  CT of the abdomen and pelvis with contrast 10/05/2014 FINDINGS: Right Kidney: Length: 11.1 cm, within normal limits. An anechoic cyst with increased through transmission is present at the right upper pole measuring 5.0 x 5.0 x 4.8 cm. There is thinning of the renal cortex with increased echogenicity relative to the index organ. No focal lesion or hydronephrosis is present. Left Kidney: Length: 12.8 cm, within normal limits. There is thinning of the a normal renal parenchyma. The parenchyma is somewhat echogenic. No focal stone or mass lesion is present. There is no hydronephrosis. Bladder: Appears normal for degree of bladder distention. IMPRESSION: 1. Thinning of the renal parenchyma with increased echogenicity bilaterally compatible with medical renal disease. 2. No obstruction or solid mass lesion. 3. 5 cm simple cyst at the upper pole of the right kidney. Electronically Signed   By: San Morelle M.D.   On: 10/14/2015 18:58   Dg Chest Portable 1 View  Result Date: 10/13/2015 CLINICAL DATA:  Fever EXAM: PORTABLE CHEST 1 VIEW COMPARISON:  December 04, 2014 FINDINGS: There is patchy opacity in the left base, also present on prior study, likely due to scarring. Lungs elsewhere are clear. Heart is borderline enlarged with pulmonary vascularity within normal limits. No  adenopathy. Patient is status post coronary artery bypass grafting. No bone lesions. IMPRESSION: Scarring left base. Subtle superimposed pneumonia in this area cannot be entirely excluded. Lungs elsewhere clear. Stable cardiac silhouette. Electronically Signed   By: Lowella Grip III M.D.   On: 10/13/2015 21:16     Subjective: No new complaints.   Discharge Exam: Vitals:   10/15/15 2308 10/16/15 0431  BP: 137/63 127/61  Pulse: (!) 58 (!) 54  Resp: 18 18  Temp: 99.2 F (37.3 C) 98.9 F (37.2 C)   Vitals:   10/15/15 1204 10/15/15 1400 10/15/15 2308 10/16/15 0431  BP: (!) 115/52  137/63 127/61  Pulse: (!) 56  (!) 58 (!) 54  Resp: 18  18 18   Temp: 98.2 F (36.8 C)  99.2 F (37.3 C) 98.9 F (37.2 C)  TempSrc: Oral  Oral Oral  SpO2: 98%  96% 95%  Weight:    70.8 kg (156 lb 1.6 oz)  Height:  5\' 5"  (1.651 m)      General: Pt is alert, awake, not in acute distress Cardiovascular: RRR, S1/S2 +,  no rubs, no gallops Respiratory: CTA bilaterally, no wheezing, no rhonchi Abdominal: Soft, NT, ND, bowel sounds + Extremities: no edema, no cyanosis    The results of significant diagnostics from this hospitalization (including imaging, microbiology, ancillary and laboratory) are listed below for reference.     Microbiology: Recent Results (from the past 240 hour(s))  Urine culture     Status: Abnormal   Collection Time: 10/13/15  7:26 PM  Result Value Ref Range Status   Specimen Description URINE, RANDOM  Final   Special Requests PED BAG  Final   Culture (A)  Final    >=100,000 COLONIES/mL ESCHERICHIA COLI >=100,000 COLONIES/mL ENTEROCOCCUS FAECALIS    Report Status 10/16/2015 FINAL  Final   Organism ID, Bacteria ESCHERICHIA COLI (A)  Final   Organism ID, Bacteria ENTEROCOCCUS FAECALIS (A)  Final      Susceptibility   Escherichia coli - MIC*    AMPICILLIN <=2 SENSITIVE Sensitive     CEFAZOLIN <=4 SENSITIVE Sensitive     CEFTRIAXONE <=1 SENSITIVE Sensitive      CIPROFLOXACIN <=0.25 SENSITIVE Sensitive     GENTAMICIN 4 SENSITIVE Sensitive     IMIPENEM <=0.25 SENSITIVE Sensitive     NITROFURANTOIN <=16 SENSITIVE Sensitive     TRIMETH/SULFA <=20 SENSITIVE Sensitive     AMPICILLIN/SULBACTAM <=2 SENSITIVE Sensitive     PIP/TAZO <=4 SENSITIVE Sensitive     * >=100,000 COLONIES/mL ESCHERICHIA COLI   Enterococcus faecalis - MIC*    AMPICILLIN <=2 SENSITIVE Sensitive     LEVOFLOXACIN 0.5 SENSITIVE Sensitive     NITROFURANTOIN <=16 SENSITIVE Sensitive     VANCOMYCIN 1 SENSITIVE Sensitive     * >=100,000 COLONIES/mL ENTEROCOCCUS FAECALIS  Culture, blood (routine x 2)     Status: None (Preliminary result)   Collection Time: 10/14/15 12:17 AM  Result Value Ref Range Status   Specimen Description BLOOD RIGHT ARM  Final   Special Requests BOTTLES DRAWN AEROBIC AND ANAEROBIC 5ML  Final   Culture NO GROWTH 4 DAYS  Final   Report Status PENDING  Incomplete  Culture, blood (routine x 2)     Status: None (Preliminary result)   Collection Time: 10/14/15 12:24 AM  Result Value Ref Range Status   Specimen Description BLOOD LEFT ARM  Final   Special Requests BOTTLES DRAWN AEROBIC AND ANAEROBIC 5ML  Final   Culture NO GROWTH 4 DAYS  Final   Report Status PENDING  Incomplete     Labs: BNP (last 3 results) No results for input(s): BNP in the last 8760 hours. Basic Metabolic Panel:  Recent Labs Lab 10/13/15 1835 10/14/15 0009 10/14/15 0506 10/15/15 0345 10/16/15 0257  NA 135  --  136 137 137  K 4.2  --  4.2 3.5 3.8  CL 101  --  104 107 108  CO2 27  --  26 26 25   GLUCOSE 141*  --  122* 113* 113*  BUN 21*  --  22* 21* 20  CREATININE 1.44* 1.28* 1.44* 1.21 1.09  CALCIUM 9.4  --  8.8* 8.4* 8.2*  MG  --   --   --   --  2.0   Liver Function Tests:  Recent Labs Lab 10/13/15 1835 10/14/15 0506  AST 29 25  ALT 22 19  ALKPHOS 71 62  BILITOT 1.8* 1.3*  PROT 6.9 6.0*  ALBUMIN 4.1 3.4*   No results for input(s): LIPASE, AMYLASE in the last 168  hours. No results for input(s): AMMONIA in the last 168 hours. CBC:  Recent Labs Lab 10/13/15 1835 10/14/15 0009 10/14/15 0506 10/16/15 0257  WBC 16.7* 15.2* 14.4* 5.2  NEUTROABS 14.1*  --   --   --   HGB 15.1 14.9 13.7 12.0*  HCT 45.5 45.3 41.4 37.2*  MCV 92.9 93.0 92.8 92.5  PLT 105* 106* 86* 84*   Cardiac Enzymes:  Recent Labs Lab 10/14/15 0009 10/14/15 0506 10/14/15 1033  CKTOTAL 98  --   --   TROPONINI 0.03* 0.05* 0.03*   BNP: Invalid input(s): POCBNP CBG: No results for input(s): GLUCAP in the last 168 hours. D-Dimer No results for input(s): DDIMER in the last 72 hours. Hgb A1c No results for input(s): HGBA1C in the last 72 hours. Lipid Profile No results for input(s): CHOL, HDL, LDLCALC, TRIG, CHOLHDL, LDLDIRECT in the last 72 hours. Thyroid function studies No results for input(s): TSH, T4TOTAL, T3FREE, THYROIDAB in the last 72 hours.  Invalid input(s): FREET3 Anemia work up No results for input(s): VITAMINB12, FOLATE, FERRITIN, TIBC, IRON, RETICCTPCT in the last 72 hours. Urinalysis    Component Value Date/Time   COLORURINE AMBER (A) 10/13/2015 1926   APPEARANCEUR CLOUDY (A) 10/13/2015 1926   LABSPEC 1.018 10/13/2015 1926   PHURINE 5.5 10/13/2015 1926   GLUCOSEU NEGATIVE 10/13/2015 1926   HGBUR MODERATE (A) 10/13/2015 1926   BILIRUBINUR NEGATIVE 10/13/2015 1926   BILIRUBINUR neg 06/30/2014 1530   KETONESUR NEGATIVE 10/13/2015 1926   PROTEINUR 100 (A) 10/13/2015 1926   UROBILINOGEN 0.2 12/04/2014 2344   NITRITE POSITIVE (A) 10/13/2015 1926   LEUKOCYTESUR LARGE (A) 10/13/2015 1926   Sepsis Labs Invalid input(s): PROCALCITONIN,  WBC,  LACTICIDVEN Microbiology Recent Results (from the past 240 hour(s))  Urine culture     Status: Abnormal   Collection Time: 10/13/15  7:26 PM  Result Value Ref Range Status   Specimen Description URINE, RANDOM  Final   Special Requests PED BAG  Final   Culture (A)  Final    >=100,000 COLONIES/mL ESCHERICHIA  COLI >=100,000 COLONIES/mL ENTEROCOCCUS FAECALIS    Report Status 10/16/2015 FINAL  Final   Organism ID, Bacteria ESCHERICHIA COLI (A)  Final   Organism ID, Bacteria ENTEROCOCCUS FAECALIS (A)  Final      Susceptibility   Escherichia coli - MIC*    AMPICILLIN <=2 SENSITIVE Sensitive     CEFAZOLIN <=4 SENSITIVE Sensitive     CEFTRIAXONE <=1 SENSITIVE Sensitive     CIPROFLOXACIN <=0.25 SENSITIVE Sensitive     GENTAMICIN 4 SENSITIVE Sensitive     IMIPENEM <=0.25 SENSITIVE Sensitive     NITROFURANTOIN <=16 SENSITIVE Sensitive     TRIMETH/SULFA <=20 SENSITIVE Sensitive     AMPICILLIN/SULBACTAM <=2 SENSITIVE Sensitive     PIP/TAZO <=4 SENSITIVE Sensitive     * >=100,000 COLONIES/mL ESCHERICHIA COLI   Enterococcus faecalis - MIC*    AMPICILLIN <=2 SENSITIVE Sensitive     LEVOFLOXACIN 0.5 SENSITIVE Sensitive     NITROFURANTOIN <=16 SENSITIVE Sensitive     VANCOMYCIN 1 SENSITIVE Sensitive     * >=100,000 COLONIES/mL ENTEROCOCCUS FAECALIS  Culture, blood (routine x 2)     Status: None (Preliminary result)   Collection Time: 10/14/15 12:17 AM  Result Value Ref Range Status   Specimen Description BLOOD RIGHT ARM  Final   Special Requests BOTTLES DRAWN AEROBIC AND ANAEROBIC 5ML  Final   Culture NO GROWTH 4 DAYS  Final   Report Status PENDING  Incomplete  Culture, blood (routine x 2)     Status: None (Preliminary result)   Collection Time:  10/14/15 12:24 AM  Result Value Ref Range Status   Specimen Description BLOOD LEFT ARM  Final   Special Requests BOTTLES DRAWN AEROBIC AND ANAEROBIC 5ML  Final   Culture NO GROWTH 4 DAYS  Final   Report Status PENDING  Incomplete     Time coordinating discharge: Over 30 minutes  SIGNED:   Hosie Poisson, MD  [facility] 10/19/2015, 7:10 AM Pager   If 7PM-7AM, please contact night-coverage [email or web address] Password [email or web address]

## 2015-10-20 DIAGNOSIS — T83511D Infection and inflammatory reaction due to indwelling urethral catheter, subsequent encounter: Secondary | ICD-10-CM | POA: Diagnosis not present

## 2015-10-20 DIAGNOSIS — I110 Hypertensive heart disease with heart failure: Secondary | ICD-10-CM | POA: Diagnosis not present

## 2015-10-20 DIAGNOSIS — N3020 Other chronic cystitis without hematuria: Secondary | ICD-10-CM | POA: Diagnosis not present

## 2015-10-20 DIAGNOSIS — I5022 Chronic systolic (congestive) heart failure: Secondary | ICD-10-CM | POA: Diagnosis not present

## 2015-10-20 DIAGNOSIS — F0390 Unspecified dementia without behavioral disturbance: Secondary | ICD-10-CM | POA: Diagnosis not present

## 2015-10-20 DIAGNOSIS — Z435 Encounter for attention to cystostomy: Secondary | ICD-10-CM | POA: Diagnosis not present

## 2015-10-21 ENCOUNTER — Telehealth: Payer: Self-pay | Admitting: Internal Medicine

## 2015-10-21 DIAGNOSIS — T83511D Infection and inflammatory reaction due to indwelling urethral catheter, subsequent encounter: Secondary | ICD-10-CM | POA: Diagnosis not present

## 2015-10-21 DIAGNOSIS — I5022 Chronic systolic (congestive) heart failure: Secondary | ICD-10-CM | POA: Diagnosis not present

## 2015-10-21 DIAGNOSIS — Z435 Encounter for attention to cystostomy: Secondary | ICD-10-CM | POA: Diagnosis not present

## 2015-10-21 DIAGNOSIS — I110 Hypertensive heart disease with heart failure: Secondary | ICD-10-CM | POA: Diagnosis not present

## 2015-10-21 DIAGNOSIS — N3020 Other chronic cystitis without hematuria: Secondary | ICD-10-CM | POA: Diagnosis not present

## 2015-10-21 DIAGNOSIS — F0390 Unspecified dementia without behavioral disturbance: Secondary | ICD-10-CM | POA: Diagnosis not present

## 2015-10-21 NOTE — Telephone Encounter (Signed)
Ronald [name] with [facility] is requesting verbal orders for PT. Insurance company wants to confirm it id ok for [facility] to continue PT.  2 x / 4wk

## 2015-10-21 NOTE — Telephone Encounter (Signed)
[facility] is requesting verbal order to continue therapy for UTI and monitoring and education and be able to expect verbal orders from Dr. Link Snuffer the Urologist.

## 2015-10-22 DIAGNOSIS — F0390 Unspecified dementia without behavioral disturbance: Secondary | ICD-10-CM | POA: Diagnosis not present

## 2015-10-22 DIAGNOSIS — T83511D Infection and inflammatory reaction due to indwelling urethral catheter, subsequent encounter: Secondary | ICD-10-CM | POA: Diagnosis not present

## 2015-10-22 DIAGNOSIS — I5022 Chronic systolic (congestive) heart failure: Secondary | ICD-10-CM | POA: Diagnosis not present

## 2015-10-22 DIAGNOSIS — Z435 Encounter for attention to cystostomy: Secondary | ICD-10-CM | POA: Diagnosis not present

## 2015-10-22 DIAGNOSIS — N3020 Other chronic cystitis without hematuria: Secondary | ICD-10-CM | POA: Diagnosis not present

## 2015-10-22 DIAGNOSIS — I110 Hypertensive heart disease with heart failure: Secondary | ICD-10-CM | POA: Diagnosis not present

## 2015-10-22 NOTE — Telephone Encounter (Signed)
Ronald [name] @ [facility] verbal orders to continue UTI therapy

## 2015-10-22 NOTE — Telephone Encounter (Signed)
FYI  Spoke with Threasa Beards at [facility] and confirmed PT for [facility].

## 2015-10-23 DIAGNOSIS — I110 Hypertensive heart disease with heart failure: Secondary | ICD-10-CM | POA: Diagnosis not present

## 2015-10-23 DIAGNOSIS — N3020 Other chronic cystitis without hematuria: Secondary | ICD-10-CM | POA: Diagnosis not present

## 2015-10-23 DIAGNOSIS — F0390 Unspecified dementia without behavioral disturbance: Secondary | ICD-10-CM | POA: Diagnosis not present

## 2015-10-23 DIAGNOSIS — Z435 Encounter for attention to cystostomy: Secondary | ICD-10-CM | POA: Diagnosis not present

## 2015-10-23 DIAGNOSIS — I5022 Chronic systolic (congestive) heart failure: Secondary | ICD-10-CM | POA: Diagnosis not present

## 2015-10-23 DIAGNOSIS — T83511D Infection and inflammatory reaction due to indwelling urethral catheter, subsequent encounter: Secondary | ICD-10-CM | POA: Diagnosis not present

## 2015-10-26 ENCOUNTER — Ambulatory Visit (INDEPENDENT_AMBULATORY_CARE_PROVIDER_SITE_OTHER): Payer: Medicare Other | Admitting: Internal Medicine

## 2015-10-26 ENCOUNTER — Encounter: Payer: Self-pay | Admitting: Internal Medicine

## 2015-10-26 VITALS — BP 114/60 | HR 55 | Temp 97.4°F | Wt 153.6 lb

## 2015-10-26 DIAGNOSIS — Z09 Encounter for follow-up examination after completed treatment for conditions other than malignant neoplasm: Secondary | ICD-10-CM

## 2015-10-26 DIAGNOSIS — N390 Urinary tract infection, site not specified: Secondary | ICD-10-CM | POA: Diagnosis not present

## 2015-10-26 DIAGNOSIS — N178 Other acute kidney failure: Secondary | ICD-10-CM | POA: Diagnosis not present

## 2015-10-26 DIAGNOSIS — I5022 Chronic systolic (congestive) heart failure: Secondary | ICD-10-CM | POA: Diagnosis not present

## 2015-10-26 DIAGNOSIS — I10 Essential (primary) hypertension: Secondary | ICD-10-CM

## 2015-10-26 DIAGNOSIS — R531 Weakness: Secondary | ICD-10-CM

## 2015-10-26 NOTE — Progress Notes (Signed)
Subjective:    Patient ID: Ronald [name], male    DOB: 1927-10-25, 82 y.o.   MRN: [id number]  HPI  Admit date: 10/13/2015 Discharge date: 10/16/2015  Recommendations for Outpatient Follow-up:  1. Follow up with PCP in 1-2 weeks 2. Please obtain BMP/CBC in one week 3. Please follow up on the following pending results:  Home Health:yes  Discharge Condition:stable.  CODE STATUS:full code.  Diet recommendation: Heart Healthy   Brief/Interim Summary: MYKE MAMMONE is a 82 y.o. 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 He was found to have E coli UTI.   Discharge Diagnoses:  Principal Problem:   Generalized weakness Active Problems:   Chronic systolic CHF (congestive heart failure) (HCC)   UTI (lower urinary tract infection)   ARF (acute renal failure) ([place])   82 year old patient who is seen today following a recent hospital discharge and for transitional care management.  He was admitted with dehydration, weakness and a urinary tract infection.  He has a history of ischemic cardiomyopathy, which has been stable.  He does have a chronic indwelling suprapubic catheter.  Since his discharge, he has done well.  He continues to receive physical therapy and getting stronger.  Today he is a copy by his wife who states that he is eating and drinking very well and making very nice progress.  He is followed by cardiology and urology.   No new concerns or complaints    He has completed antibiotic therapy  Hospital records reviewed  Past Medical History:  Diagnosis Date  . Arthritis    "joints ache" (01/02/2014)  . At risk for sleep apnea    STOP-BANG= 4    SENT TO PCP 12-23-2013  . Bladder calculi   . CHF (congestive heart failure) ([place])   . Chronic cystitis   . Coronary artery  disease CARDIOLOGIST-  DR Grace Bushy  [place] -- S/P  11/89 CABG x6 (70% circ; 90% PD; 60-70% distal left main; 30% left circ; 90% 1st diag;, 2nd diag and 3rd diag. w/90%,  mild stenosis LAD and 60-70% pLAD)  Re-do CABG x5 in 1997  . Diverticulosis   . GERD (gastroesophageal reflux disease)   . History of Bell's palsy    RIGHT SIDE-- NO RESIDUAL  . Hx of dizziness   . Hyperlipidemia   . Hypertension    "not anymore" (01/02/2014)  . Ischemic cardiomyopathy    ef 35-40% per cath 08-28-2013  . Kidney stones "years ago"   "passed them"  . Melanoma of ear ([place])    "right"  . Mild dementia   . Myocardial infarction ([place]) 1986; 1997  . Nocturia   . OAB (overactive bladder)   . Prostate cancer ([place]) 1998   S/P  [place]; [place]  . S/P CABG (coronary artery bypass graft)    [place]  . Skipped heart beats    occasional  . Urethral stricture   . UTI (urinary tract infection) 09/2015  . Wears hearing aid    bilateral  . Wears partial dentures      Social History   Social History  . Marital status: Married    Spouse name: N/A  . Number of children: N/A  . Years of education: N/A   Occupational History  . Not on file.  Social History Main Topics  . Smoking status: Former Smoker    Years: 40.00    Types: Cigars    Quit date: 12/24/1983  . Smokeless tobacco: Former Systems developer    Types: Chew    Quit date: 08/28/2013  . Alcohol use No  . Drug use: No  . Sexual activity: Not on file   Other Topics Concern  . Not on file   Social History Narrative  . No narrative on file    Past Surgical History:  Procedure Laterality Date  . CARDIAC CATHETERIZATION  02-22-2005  dr Vidal Schwalbe   mild to moderate lv dysfunction with inferobasilar akinesis/  totally occluded SVG to Intermediate Diagonal and SVG to PDA and RCA branches, totally occluded native coronary circulation with diffuse disease pLAD diagonal system with potentially could be ischemic/   patent SVG to OM with collaterals to dRCA and patent LIMA to LAD and diagonal systemss  . CARDIAC CATHETERIZATION  08-28-2013  DR Daneen Schick   widely patent sequential left internal graft to the diagonal/LAD, widely patent SVG to OM with proximal 50% narrowing noted in the graft, total occlusion SVG's to RCA, RI, and the Diagonal/ LV dysfunction with inferobasal aneurysm and mid anterior wall region of akinesis/  overall EF 35-40%/  Total occlusion of the navtive circulation/  no significant change compared to 2006 cath  . CARDIOVASCULAR STRESS TEST  08-01-2011  dr Angelena Form   inferior scar and possible soft tissue attenuation with minimal peri-infarct ischemia, small region of anterior ischemia and scar/  LVEF 53% LV wall motion with inferior hypokinesis/ no significant change from scan july 2011  . CATARACT EXTRACTION W/ INTRAOCULAR LENS  IMPLANT, BILATERAL Bilateral 2007  . CORONARY ARTERY BYPASS GRAFT  11/ [place]-- 6 vessel/  1997 Re-do 5 vessel  . CYSTOSCOPY WITH URETHRAL DILATATION N/A 12/25/2013   Procedure: CYSTOSCOPY WITH URETHRAL DILATATION, WITH BIOPSY;  Surgeon: Bernestine Amass, MD;  Location: [facility];  Service: Urology;  Laterality: N/A;  . CYSTOSCOPY WITH URETHRAL DILATATION N/A 12/22/2014   Procedure: CYSTOSCOPY WITH URETHRAL DILATATION;  Surgeon: Rana Snare, MD;  Location: [facility];  Service: Urology;  Laterality: N/A;  BALLOON DILATION CATHETER    . EUS  10/05/2011   Procedure: ESOPHAGEAL ENDOSCOPIC ULTRASOUND (EUS) RADIAL;  Surgeon: Arta Silence, MD;  Location: [facility];  Service: Endoscopy;  Laterality: N/A;  . INSERTION OF SUPRAPUBIC CATHETER N/A 12/22/2014   Procedure: INSERTION OF SUPRAPUBIC CATHETER ;  Surgeon: Rana Snare, MD;  Location: [facility];  Service: Urology;  Laterality: N/A;  . LAPAROSCOPIC CHOLECYSTECTOMY  12-23-2005  . LEFT HEART CATHETERIZATION WITH CORONARY/GRAFT ANGIOGRAM N/A 08/28/2013   Procedure: LEFT HEART  CATHETERIZATION WITH Beatrix Fetters;  Surgeon: Sinclair Grooms, MD;  Location[facility];  Service: Cardiovascular;  Laterality: N/A;  . MELANOMA EXCISION  X 1   "ear"  . RADIOACTIVE PROSTATE SEED IMPLANTS  1998  . SKIN CANCER EXCISION  X 2   "top of head"  . TONSILLECTOMY AND ADENOIDECTOMY  1954    Family History  Problem Relation Age of Onset  . Heart disease Mother   . Diabetes Father   . Heart disease Brother     Allergies  Allergen Reactions  . Pantoprazole Other (See Comments)    Headache and lightheaded  . Nitrofurantoin Hives  . Sulfa Antibiotics Hives and Itching    Current Outpatient Prescriptions on File Prior to Visit  Medication Sig Dispense Refill  .  acetaminophen (TYLENOL) 500 MG tablet Take 500 mg by mouth every 6 (six) hours as needed for pain or fever.     Marland Kitchen aspirin EC 81 MG EC tablet Take 1 tablet (81 mg total) by mouth daily. 30 tablet 0  . atorvastatin (LIPITOR) 80 MG tablet Take 40 mg by mouth every evening.    . calcium carbonate (TUMS - DOSED IN MG ELEMENTAL CALCIUM) 500 MG chewable tablet Chew 2 tablets by mouth as needed for indigestion or heartburn.    . Cholecalciferol (VITAMIN D) 2000 UNITS tablet Take 2,000 Units by mouth daily.    . flavoxATE (URISPAS) 100 MG tablet Take 100 mg by mouth 3 (three) times daily as needed for bladder spasms.    Marland Kitchen galantamine (RAZADYNE ER) 8 MG 24 hr capsule Take 8 mg by mouth daily with breakfast.    . hydroxypropyl methylcellulose (ISOPTO TEARS) 2.5 % ophthalmic solution Place 1 drop into both eyes 2 (two) times daily.     . isosorbide mononitrate (IMDUR) 60 MG 24 hr tablet Take 0.5 tablets (30 mg total) by mouth every morning.    Marland Kitchen levofloxacin (LEVAQUIN) 750 MG tablet Take 1 tablet (750 mg total) by mouth every other day. 3 tablet 0  . Multiple Vitamin (MULTIVITAMIN WITH MINERALS) TABS Take 1 tablet by mouth daily.    . nitroGLYCERIN (NITROSTAT) 0.4 MG SL tablet Place 0.4 mg under the tongue every 5  (five) minutes as needed for chest pain.     . phenazopyridine (PYRIDIUM) 200 MG tablet Take 200 mg by mouth 3 (three) times daily as needed for pain.     No current facility-administered medications on file prior to visit.     BP 114/60 (BP Location: Left Arm, Patient Position: Sitting, Cuff Size: Normal)   Pulse (!) 55   Temp 97.4 F (36.3 C) (Oral)   Wt 153 lb 9.6 oz (69.7 kg)   SpO2 98%   BMI 25.56 kg/m      Hospital records reviewed  Review of Systems  Constitutional: Positive for fatigue. Negative for appetite change, chills and fever.  HENT: Negative for congestion, dental problem, ear pain, hearing loss, sore throat, tinnitus, trouble swallowing and voice change.   Eyes: Negative for pain, discharge and visual disturbance.  Respiratory: Negative for cough, chest tightness, wheezing and stridor.   Cardiovascular: Negative for chest pain, palpitations and leg swelling.  Gastrointestinal: Negative for abdominal distention, abdominal pain, blood in stool, constipation, diarrhea, nausea and vomiting.  Genitourinary: Negative for difficulty urinating, discharge, flank pain, genital sores, hematuria and urgency.  Musculoskeletal: Negative for arthralgias, back pain, gait problem, joint swelling, myalgias and neck stiffness.  Skin: Negative for rash.  Neurological: Positive for weakness. Negative for dizziness, syncope, speech difficulty, numbness and headaches.  Hematological: Negative for adenopathy. Does not bruise/bleed easily.  Psychiatric/Behavioral: Negative for behavioral problems and dysphoric mood. The patient is not nervous/anxious.        Objective:   Physical Exam  Constitutional: He is oriented to person, place, and time. He appears well-developed.  Afebrile Blood pressure low normal Pulse rate 55  HENT:  Head: Normocephalic.  Right Ear: External ear normal.  Left Ear: External ear normal.  Eyes: Conjunctivae and EOM are normal.  Neck: Normal range of  motion.  Cardiovascular: Normal rate and normal heart sounds.   Pulmonary/Chest: Breath sounds normal.  Abdominal: Bowel sounds are normal.  Genitourinary:  Genitourinary Comments: Status post suprapubic catheter with drainage bag in place  Musculoskeletal: Normal range of  motion. He exhibits no edema or tenderness.  Neurological: He is alert and oriented to person, place, and time.  Alert and appropriate  Psychiatric: He has a normal mood and affect. His behavior is normal.          Assessment & Plan:    status post recent hospital admission for urosepsis and dehydration  status post suprapubic catheter , history of urethral stricture disease , chronic systolic heart failure , history of acute renal failure, resolved  .  Deconditioning.  Will complete physical therapy  .  Follow cardiology and urology  .  Recheck here 6 months or as needed  Nyoka Cowden, MD

## 2015-10-26 NOTE — Progress Notes (Signed)
Pre visit review using our clinic review tool, if applicable. No additional management support is needed unless otherwise documented below in the visit note. 

## 2015-10-26 NOTE — Patient Instructions (Signed)
Follow-up urology and cardiology  Limit your sodium (Salt) intake  Return in one year for follow-up [phone]

## 2015-10-27 DIAGNOSIS — I110 Hypertensive heart disease with heart failure: Secondary | ICD-10-CM | POA: Diagnosis not present

## 2015-10-27 DIAGNOSIS — F0390 Unspecified dementia without behavioral disturbance: Secondary | ICD-10-CM | POA: Diagnosis not present

## 2015-10-27 DIAGNOSIS — N3020 Other chronic cystitis without hematuria: Secondary | ICD-10-CM | POA: Diagnosis not present

## 2015-10-27 DIAGNOSIS — T83511D Infection and inflammatory reaction due to indwelling urethral catheter, subsequent encounter: Secondary | ICD-10-CM | POA: Diagnosis not present

## 2015-10-27 DIAGNOSIS — I5022 Chronic systolic (congestive) heart failure: Secondary | ICD-10-CM | POA: Diagnosis not present

## 2015-10-27 DIAGNOSIS — Z435 Encounter for attention to cystostomy: Secondary | ICD-10-CM | POA: Diagnosis not present

## 2015-10-29 DIAGNOSIS — Z435 Encounter for attention to cystostomy: Secondary | ICD-10-CM | POA: Diagnosis not present

## 2015-10-29 DIAGNOSIS — F0390 Unspecified dementia without behavioral disturbance: Secondary | ICD-10-CM | POA: Diagnosis not present

## 2015-10-29 DIAGNOSIS — I5022 Chronic systolic (congestive) heart failure: Secondary | ICD-10-CM | POA: Diagnosis not present

## 2015-10-29 DIAGNOSIS — T83511D Infection and inflammatory reaction due to indwelling urethral catheter, subsequent encounter: Secondary | ICD-10-CM | POA: Diagnosis not present

## 2015-10-29 DIAGNOSIS — I110 Hypertensive heart disease with heart failure: Secondary | ICD-10-CM | POA: Diagnosis not present

## 2015-10-29 DIAGNOSIS — N3020 Other chronic cystitis without hematuria: Secondary | ICD-10-CM | POA: Diagnosis not present

## 2015-10-30 DIAGNOSIS — N3020 Other chronic cystitis without hematuria: Secondary | ICD-10-CM | POA: Diagnosis not present

## 2015-10-30 DIAGNOSIS — Z435 Encounter for attention to cystostomy: Secondary | ICD-10-CM | POA: Diagnosis not present

## 2015-10-30 DIAGNOSIS — I110 Hypertensive heart disease with heart failure: Secondary | ICD-10-CM | POA: Diagnosis not present

## 2015-10-30 DIAGNOSIS — I5022 Chronic systolic (congestive) heart failure: Secondary | ICD-10-CM | POA: Diagnosis not present

## 2015-10-30 DIAGNOSIS — F0390 Unspecified dementia without behavioral disturbance: Secondary | ICD-10-CM | POA: Diagnosis not present

## 2015-10-30 DIAGNOSIS — T83511D Infection and inflammatory reaction due to indwelling urethral catheter, subsequent encounter: Secondary | ICD-10-CM | POA: Diagnosis not present

## 2015-11-04 DIAGNOSIS — F0390 Unspecified dementia without behavioral disturbance: Secondary | ICD-10-CM | POA: Diagnosis not present

## 2015-11-04 DIAGNOSIS — T83511D Infection and inflammatory reaction due to indwelling urethral catheter, subsequent encounter: Secondary | ICD-10-CM | POA: Diagnosis not present

## 2015-11-04 DIAGNOSIS — N3020 Other chronic cystitis without hematuria: Secondary | ICD-10-CM | POA: Diagnosis not present

## 2015-11-04 DIAGNOSIS — I110 Hypertensive heart disease with heart failure: Secondary | ICD-10-CM | POA: Diagnosis not present

## 2015-11-04 DIAGNOSIS — Z435 Encounter for attention to cystostomy: Secondary | ICD-10-CM | POA: Diagnosis not present

## 2015-11-04 DIAGNOSIS — I5022 Chronic systolic (congestive) heart failure: Secondary | ICD-10-CM | POA: Diagnosis not present

## 2015-11-06 DIAGNOSIS — I5022 Chronic systolic (congestive) heart failure: Secondary | ICD-10-CM | POA: Diagnosis not present

## 2015-11-06 DIAGNOSIS — N3020 Other chronic cystitis without hematuria: Secondary | ICD-10-CM | POA: Diagnosis not present

## 2015-11-06 DIAGNOSIS — Z435 Encounter for attention to cystostomy: Secondary | ICD-10-CM | POA: Diagnosis not present

## 2015-11-06 DIAGNOSIS — I110 Hypertensive heart disease with heart failure: Secondary | ICD-10-CM | POA: Diagnosis not present

## 2015-11-06 DIAGNOSIS — F0390 Unspecified dementia without behavioral disturbance: Secondary | ICD-10-CM | POA: Diagnosis not present

## 2015-11-06 DIAGNOSIS — T83511D Infection and inflammatory reaction due to indwelling urethral catheter, subsequent encounter: Secondary | ICD-10-CM | POA: Diagnosis not present

## 2015-11-09 DIAGNOSIS — F0390 Unspecified dementia without behavioral disturbance: Secondary | ICD-10-CM | POA: Diagnosis not present

## 2015-11-09 DIAGNOSIS — I110 Hypertensive heart disease with heart failure: Secondary | ICD-10-CM | POA: Diagnosis not present

## 2015-11-09 DIAGNOSIS — T83511D Infection and inflammatory reaction due to indwelling urethral catheter, subsequent encounter: Secondary | ICD-10-CM | POA: Diagnosis not present

## 2015-11-09 DIAGNOSIS — I5022 Chronic systolic (congestive) heart failure: Secondary | ICD-10-CM | POA: Diagnosis not present

## 2015-11-11 DIAGNOSIS — T83511D Infection and inflammatory reaction due to indwelling urethral catheter, subsequent encounter: Secondary | ICD-10-CM | POA: Diagnosis not present

## 2015-11-11 DIAGNOSIS — I110 Hypertensive heart disease with heart failure: Secondary | ICD-10-CM | POA: Diagnosis not present

## 2015-11-11 DIAGNOSIS — N3020 Other chronic cystitis without hematuria: Secondary | ICD-10-CM | POA: Diagnosis not present

## 2015-11-11 DIAGNOSIS — I5022 Chronic systolic (congestive) heart failure: Secondary | ICD-10-CM | POA: Diagnosis not present

## 2015-11-11 DIAGNOSIS — Z435 Encounter for attention to cystostomy: Secondary | ICD-10-CM | POA: Diagnosis not present

## 2015-11-11 DIAGNOSIS — F0390 Unspecified dementia without behavioral disturbance: Secondary | ICD-10-CM | POA: Diagnosis not present

## 2015-11-13 DIAGNOSIS — Z8546 Personal history of malignant neoplasm of prostate: Secondary | ICD-10-CM | POA: Diagnosis not present

## 2015-11-13 DIAGNOSIS — N359 Urethral stricture, unspecified: Secondary | ICD-10-CM | POA: Diagnosis not present

## 2015-12-23 DIAGNOSIS — Z23 Encounter for immunization: Secondary | ICD-10-CM | POA: Diagnosis not present

## 2015-12-25 DIAGNOSIS — N359 Urethral stricture, unspecified: Secondary | ICD-10-CM | POA: Diagnosis not present

## 2016-01-12 ENCOUNTER — Emergency Department (HOSPITAL_COMMUNITY)
Admission: EM | Admit: 2016-01-12 | Discharge: 2016-01-13 | Disposition: A | Discharge status: Home / Self Care | Payer: Medicare Other | Source: Home / Self Care | Attending: Emergency Medicine | Admitting: Emergency Medicine

## 2016-01-12 ENCOUNTER — Emergency Department (HOSPITAL_COMMUNITY): Payer: Medicare Other

## 2016-01-12 ENCOUNTER — Encounter (HOSPITAL_COMMUNITY): Payer: Self-pay | Admitting: Emergency Medicine

## 2016-01-12 DIAGNOSIS — Z7982 Long term (current) use of aspirin: Secondary | ICD-10-CM

## 2016-01-12 DIAGNOSIS — G9340 Encephalopathy, unspecified: Secondary | ICD-10-CM | POA: Diagnosis not present

## 2016-01-12 DIAGNOSIS — Z951 Presence of aortocoronary bypass graft: Secondary | ICD-10-CM

## 2016-01-12 DIAGNOSIS — N3000 Acute cystitis without hematuria: Secondary | ICD-10-CM | POA: Insufficient documentation

## 2016-01-12 DIAGNOSIS — I5022 Chronic systolic (congestive) heart failure: Secondary | ICD-10-CM

## 2016-01-12 DIAGNOSIS — T83511A Infection and inflammatory reaction due to indwelling urethral catheter, initial encounter: Secondary | ICD-10-CM | POA: Diagnosis not present

## 2016-01-12 DIAGNOSIS — A419 Sepsis, unspecified organism: Secondary | ICD-10-CM | POA: Diagnosis not present

## 2016-01-12 DIAGNOSIS — I110 Hypertensive heart disease with heart failure: Secondary | ICD-10-CM | POA: Insufficient documentation

## 2016-01-12 DIAGNOSIS — Z87891 Personal history of nicotine dependence: Secondary | ICD-10-CM

## 2016-01-12 DIAGNOSIS — Z8546 Personal history of malignant neoplasm of prostate: Secondary | ICD-10-CM

## 2016-01-12 DIAGNOSIS — I252 Old myocardial infarction: Secondary | ICD-10-CM | POA: Insufficient documentation

## 2016-01-12 DIAGNOSIS — R6521 Severe sepsis with septic shock: Secondary | ICD-10-CM | POA: Diagnosis not present

## 2016-01-12 DIAGNOSIS — I214 Non-ST elevation (NSTEMI) myocardial infarction: Secondary | ICD-10-CM | POA: Diagnosis not present

## 2016-01-12 DIAGNOSIS — J9691 Respiratory failure, unspecified with hypoxia: Secondary | ICD-10-CM | POA: Diagnosis not present

## 2016-01-12 DIAGNOSIS — I2510 Atherosclerotic heart disease of native coronary artery without angina pectoris: Secondary | ICD-10-CM | POA: Insufficient documentation

## 2016-01-12 DIAGNOSIS — J9601 Acute respiratory failure with hypoxia: Secondary | ICD-10-CM | POA: Diagnosis not present

## 2016-01-12 DIAGNOSIS — R509 Fever, unspecified: Secondary | ICD-10-CM | POA: Diagnosis not present

## 2016-01-12 LAB — CBC WITH DIFFERENTIAL/PLATELET
Basophils Absolute: 0 10*3/uL (ref 0.0–0.1)
Basophils Relative: 0 %
Eosinophils Absolute: 0 10*3/uL (ref 0.0–0.7)
Eosinophils Relative: 0 %
HCT: 43 % (ref 39.0–52.0)
Hemoglobin: 14.2 g/dL (ref 13.0–17.0)
Lymphocytes Relative: 4 %
Lymphs Abs: 0.5 10*3/uL — ABNORMAL LOW (ref 0.7–4.0)
MCH: 30.1 pg (ref 26.0–34.0)
MCHC: 33 g/dL (ref 30.0–36.0)
MCV: 91.3 fL (ref 78.0–100.0)
Monocytes Absolute: 1.6 10*3/uL — ABNORMAL HIGH (ref 0.1–1.0)
Monocytes Relative: 12 %
Neutro Abs: 11 10*3/uL — ABNORMAL HIGH (ref 1.7–7.7)
Neutrophils Relative %: 84 %
Platelets: 123 10*3/uL — ABNORMAL LOW (ref 150–400)
RBC: 4.71 MIL/uL (ref 4.22–5.81)
RDW: 13.6 % (ref 11.5–15.5)
WBC: 13.2 10*3/uL — ABNORMAL HIGH (ref 4.0–10.5)

## 2016-01-12 LAB — I-STAT CG4 LACTIC ACID, ED: Lactic Acid, Venous: 1.18 mmol/L (ref 0.5–1.9)

## 2016-01-12 LAB — I-STAT BETA HCG BLOOD, ED (MC, WL, AP ONLY): I-stat hCG, quantitative: <5 m[IU]/mL (ref <5)

## 2016-01-12 MED ORDER — SODIUM CHLORIDE 0.9 % IV BOLUS (SEPSIS)
500.0000 mL | Freq: Once | INTRAVENOUS | Status: AC
  Administered 2016-01-13: 500 mL via INTRAVENOUS

## 2016-01-12 NOTE — ED Provider Notes (Signed)
[facility] DEPT Provider Note   CSN: [id number] Arrival date & time: 01/12/16  2254   By signing my name below, I, Evelene Croon, attest that this documentation has been prepared under the direction and in the presence of Everlene Balls, MD . Electronically Signed: Evelene Croon, Scribe. 01/12/2016. 11:40 PM.   History   Chief Complaint Chief Complaint  Patient presents with  . Fever  . Fatigue    The history is provided by the patient. No language interpreter was used.   HPI Comments:  Ronald [name] is a 82 y.o. male who presents to the Emergency Department complaining of a fever which began this evening with a TMAX of 101.2. He states he felt hot; denies diaphoresis. He has taken 2 extra strength tylenol with relief. Pt also complains of spasms of his groin area. Pt notes the spasms began after he he had a suprapubic catheter placed 1 year ago, however family states pt was riding a lawnmower today and the spasms worsened.The catheter was last changed ~ 2 weeks ago. Pt denies penile pain. Family also reports generalized weakness.     Past Medical History:  Diagnosis Date  . Arthritis    "joints ache" (01/02/2014)  . At risk for sleep apnea    STOP-BANG= 4    SENT TO PCP 12-23-2013  . Bladder calculi   . CHF (congestive heart failure) ([place])   . Chronic cystitis   . Coronary artery disease CARDIOLOGIST-  DR Grace Bushy  MI -- S/P  11/89 CABG x6 (70% circ; 90% PD; 60-70% distal left main; 30% left circ; 90% 1st diag;, 2nd diag and 3rd diag. w/90%,  mild stenosis LAD and 60-70% pLAD)  Re-do CABG x5 in 1997  . Diverticulosis   . GERD (gastroesophageal reflux disease)   . History of Bell's palsy    RIGHT SIDE-- NO RESIDUAL  . Hx of dizziness   . Hyperlipidemia   . Hypertension    "not anymore" (01/02/2014)  . Ischemic cardiomyopathy    ef 35-40% per cath 08-28-2013  . Kidney stones "years ago"   "passed them"  . Melanoma of ear ([place])    "right"  . Mild dementia   .  Myocardial infarction 1986; 1997  . Nocturia   . OAB (overactive bladder)   . Prostate cancer ([place]) 1998   S/P  [place]; [place]  . S/P CABG (coronary artery bypass graft)    [place]  . Skipped heart beats    occasional  . Urethral stricture   . UTI (urinary tract infection) 09/2015  . Wears hearing aid    bilateral  . Wears partial dentures     Patient Active Problem List   Diagnosis Date Noted  . Generalized weakness 10/13/2015  . ARF (acute renal failure) ([place]) 10/13/2015  . Sepsis ([place]) 12/05/2014  . UTI (urinary tract infection) 10/03/2014  . UTI (lower urinary tract infection) 09/10/2014  . Fever 09/10/2014  . Urinary frequency 06/30/2014  . Bacteremia due to Enterococcus 01/22/2014  . SIRS (systemic inflammatory response syndrome) ([place]) Aug 15, 202015  . Chronic systolic CHF (congestive heart failure) ([place]) Aug 15, 202015  . Urethral stricture 12/25/2013  . Bladder calculus 12/25/2013  . LV dysfunction 09/23/2013  . Chest pain 08/27/2013  . Unstable angina ([place]) 08/27/2013  . Bacteremia due to Escherichia coli 05/20/2012  . Urinary tract infection 09/15/2011  . Calculus of kidney 09/12/2011  . Bradycardia 10/26/2010  . CHEST  WALL PAIN, ACUTE 09/04/2009  . CHILLS WITHOUT FEVER 02/10/2009  . HYPERCHOLESTEROLEMIA 10/18/2007  . HTN (hypertension) 10/18/2007  . CELLULITIS, FOOT, LEFT 02/27/2007  . OSTEOARTHRITIS 02/07/2007  . ABDOMINAL PAIN 01/01/2007  . BELL'S PALSY, RIGHT 10/26/2006  . Coronary atherosclerosis 10/26/2006  . PROSTATE CANCER, HX OF 10/26/2006  . NEPHROLITHIASIS, HX OF 10/26/2006    Past Surgical History:  Procedure Laterality Date  . CARDIAC CATHETERIZATION  02-22-2005  dr Vidal Schwalbe   mild to moderate lv dysfunction with inferobasilar akinesis/  totally occluded SVG to Intermediate Diagonal and SVG to PDA and RCA branches, totally occluded native coronary circulation with diffuse disease pLAD diagonal system with  potentially could be ischemic/  patent SVG to OM with collaterals to dRCA and patent LIMA to LAD and diagonal systemss  . CARDIAC CATHETERIZATION  08-28-2013  DR Daneen Schick   widely patent sequential left internal graft to the diagonal/LAD, widely patent SVG to OM with proximal 50% narrowing noted in the graft, total occlusion SVG's to RCA, RI, and the Diagonal/ LV dysfunction with inferobasal aneurysm and mid anterior wall region of akinesis/  overall EF 35-40%/  Total occlusion of the navtive circulation/  no significant change compared to 2006 cath  . CARDIOVASCULAR STRESS TEST  08-01-2011  dr Angelena Form   inferior scar and possible soft tissue attenuation with minimal peri-infarct ischemia, small region of anterior ischemia and scar/  LVEF 53% LV wall motion with inferior hypokinesis/ no significant change from scan july 2011  . CATARACT EXTRACTION W/ INTRAOCULAR LENS  IMPLANT, BILATERAL Bilateral 2007  . CORONARY ARTERY BYPASS GRAFT  11/ [place]-- 6 vessel/  1997 Re-do 5 vessel  . CYSTOSCOPY WITH URETHRAL DILATATION N/A 12/25/2013   Procedure: CYSTOSCOPY WITH URETHRAL DILATATION, WITH BIOPSY;  Surgeon: Bernestine Amass, MD;  [facility];  Service: Urology;  Laterality: N/A;  . CYSTOSCOPY WITH URETHRAL DILATATION N/A 12/22/2014   Procedure: CYSTOSCOPY WITH URETHRAL DILATATION;  Surgeon: Rana Snare, MD;  Location: [facility];  Service: Urology;  Laterality: N/A;  BALLOON DILATION CATHETER    . EUS  10/05/2011   Procedure: ESOPHAGEAL ENDOSCOPIC ULTRASOUND (EUS) RADIAL;  Surgeon: Arta Silence, MD;  Location: [facility];  Service: Endoscopy;  Laterality: N/A;  . INSERTION OF SUPRAPUBIC CATHETER N/A 12/22/2014   Procedure: INSERTION OF SUPRAPUBIC CATHETER ;  Surgeon: Rana Snare, MD;  Location: [facility];  Service: Urology;  Laterality: N/A;  . LAPAROSCOPIC CHOLECYSTECTOMY  12-23-2005  . LEFT HEART CATHETERIZATION WITH CORONARY/GRAFT ANGIOGRAM N/A 08/28/2013    Procedure: LEFT HEART CATHETERIZATION WITH Beatrix Fetters;  Surgeon: Sinclair Grooms, MD;  [facility] LAB;  Service: Cardiovascular;  Laterality: N/A;  . MELANOMA EXCISION  X 1   "ear"  . RADIOACTIVE PROSTATE SEED IMPLANTS  1998  . SKIN CANCER EXCISION  X 2   "top of head"  . TONSILLECTOMY AND ADENOIDECTOMY  1954       Home Medications    Prior to Admission medications   Medication Sig Start Date End Date Taking? Authorizing Provider  acetaminophen (TYLENOL) 500 MG tablet Take 500 mg by mouth every 6 (six) hours as needed for pain or fever.    Yes Historical Provider, MD  aspirin EC 81 MG EC tablet Take 1 tablet (81 mg total) by mouth daily. 08/29/13  Yes Brittainy Erie Noe, PA-C  atorvastatin (LIPITOR) 80 MG tablet Take 40 mg by mouth every evening. 09/23/13  Yes Imogene Burn, PA-C  Cholecalciferol (VITAMIN D) 2000 UNITS tablet Take 2,000 Units by mouth daily.   Yes Historical Provider, MD  flavoxATE (URISPAS) 100 MG tablet Take 100 mg by mouth 3 (three) times daily as needed for bladder spasms.   Yes Historical Provider, MD  galantamine (RAZADYNE ER) 8 MG 24 hr capsule Take 8 mg by mouth daily with breakfast.   Yes Historical Provider, MD  hydroxypropyl methylcellulose (ISOPTO TEARS) 2.5 % ophthalmic solution Place 1 drop into both eyes 2 (two) times daily.    Yes Historical Provider, MD  isosorbide mononitrate (IMDUR) 60 MG 24 hr tablet Take 0.5 tablets (30 mg total) by mouth every morning. 01/07/14  Yes Shanker Kristeen Mans, MD  Multiple Vitamin (MULTIVITAMIN WITH MINERALS) TABS Take 1 tablet by mouth daily.   Yes Historical Provider, MD  nitroGLYCERIN (NITROSTAT) 0.4 MG SL tablet Place 0.4 mg under the tongue every 5 (five) minutes as needed for chest pain.    Yes Historical Provider, MD  cephALEXin (KEFLEX) 500 MG capsule Take 1 capsule (500 mg total) by mouth 2 (two) times daily. 01/13/16   Everlene Balls, MD    Family History Family History  Problem Relation Age  of Onset  . Heart disease Mother   . Diabetes Father   . Heart disease Brother     Social History Social History  Substance Use Topics  . Smoking status: Former Smoker    Years: 40.00    Types: Cigars    Quit date: 12/24/1983  . Smokeless tobacco: Former Systems developer    Types: Chew    Quit date: 08/28/2013  . Alcohol use No     Allergies   Pantoprazole; Nitrofurantoin; and Sulfa antibiotics   Review of Systems Review of Systems  10 systems reviewed and all are negative for acute change except as noted in the HPI.   Physical Exam Updated Vital Signs BP 109/59   Pulse 68   Temp 99.1 F (37.3 C) (Rectal)   Resp 18   SpO2 95%   Physical Exam  Constitutional: He is oriented to person, place, and time. Vital signs are normal. He appears well-developed and well-nourished.  Non-toxic appearance. He does not appear ill. No distress.  HENT:  Head: Normocephalic and atraumatic.  Nose: Nose normal.  Mouth/Throat: Oropharynx is clear and moist. No oropharyngeal exudate.  Eyes: Conjunctivae and EOM are normal. Pupils are equal, round, and reactive to light. No scleral icterus.  Neck: Normal range of motion. Neck supple. No tracheal deviation, no edema, no erythema and normal range of motion present. No thyroid mass and no thyromegaly present.  Cardiovascular: Normal rate, regular rhythm, S1 normal, S2 normal, normal heart sounds, intact distal pulses and normal pulses.  Exam reveals no gallop and no friction rub.   No murmur heard. Pulmonary/Chest: Effort normal and breath sounds normal. No respiratory distress. He has no wheezes. He has no rhonchi. He has no rales.  Abdominal: Soft. Normal appearance and bowel sounds are normal. He exhibits no distension, no ascites and no mass. There is no hepatosplenomegaly. There is no tenderness. There is no rebound, no guarding and no CVA tenderness.  Genitourinary:  Genitourinary Comments: Suprapubic catheter in place  Uncircumcised penis No signs  of skin infection   Musculoskeletal: Normal range of motion. He exhibits no edema or tenderness.  Lymphadenopathy:    He has no cervical adenopathy.  Neurological: He is alert and oriented to person, place, and time. He has normal strength. No cranial nerve deficit or sensory deficit.  Skin: Skin is warm, dry and intact. No petechiae and no rash noted. He is not diaphoretic. No erythema. No pallor.  Nursing note and vitals reviewed.    ED Treatments / Results  DIAGNOSTIC STUDIES:  Oxygen Saturation is 95% on RA, adequate by my interpretation.    COORDINATION OF CARE:  11:35 PM Discussed treatment plan with pt at bedside and pt agreed to plan.  Labs (all labs ordered are listed, but only abnormal results are displayed) Labs Reviewed  COMPREHENSIVE METABOLIC PANEL - Abnormal; Notable for the following:       Result Value   Glucose, Bld 138 (*)    BUN 21 (*)    Total Protein 6.4 (*)    Total Bilirubin 1.4 (*)    GFR calc non Af Amer 56 (*)    All other components within normal limits  CBC WITH DIFFERENTIAL/PLATELET - Abnormal; Notable for the following:    WBC 13.2 (*)    Platelets 123 (*)    Neutro Abs 11.0 (*)    Lymphs Abs 0.5 (*)    Monocytes Absolute 1.6 (*)    All other components within normal limits  URINALYSIS, ROUTINE W REFLEX MICROSCOPIC (NOT AT [facility]) - Abnormal; Notable for the following:    APPearance CLOUDY (*)    pH 8.5 (*)    Hgb urine dipstick MODERATE (*)    Nitrite POSITIVE (*)    Leukocytes, UA LARGE (*)    All other components within normal limits  URINE MICROSCOPIC-ADD ON - Abnormal; Notable for the following:    Squamous Epithelial / LPF 0-5 (*)    Bacteria, UA MANY (*)    Crystals TRIPLE PHOSPHATE CRYSTALS (*)    All other components within normal limits  URINE CULTURE  I-STAT BETA HCG BLOOD, ED ([facility], [facility], [facility] ONLY)  I-STAT CG4 LACTIC ACID, ED    EKG  EKG Interpretation None       Radiology Dg Chest 2 View  Result Date:  01/13/2016 CLINICAL DATA:  She 82 year old male with fever and fatigue EXAM: CHEST  2 VIEW COMPARISON:  Chest radiograph dated 10/13/2015 and chest radiograph dated 12/04/2014 FINDINGS: There bibasilar platelike atelectasis/ scarring. Infiltrate is less likely but not excluded. There is no pleural effusion or pneumothorax. The cardiac silhouette is within normal limits. Median sternotomy wires and CABG vascular clips noted. There is osteopenia with degenerative changes of the spine. Mid thoracic compression deformities similar to prior radiograph. No acute fracture. IMPRESSION: Bibasilar platelike atelectasis/scarring. Infiltrate is less likely. Clinical correlation is recommended. Electronically Signed   By: Anner Crete M.D.   On: 01/13/2016 00:11    Procedures Procedures (including critical care time)  Medications Ordered in ED Medications  cephALEXin (KEFLEX) capsule 500 mg (not administered)  sodium chloride 0.9 % bolus 500 mL (500 mLs Intravenous New Bag/Given 01/13/16 0014)     Initial Impression / Assessment and Plan / ED Course  I have reviewed the triage vital signs and the nursing notes.  Pertinent labs & imaging results that were available during my care of the patient were reviewed by me and considered in my medical decision making (see chart for details).  Clinical Course    Patient presents to the ED for fever, weakness, and bladder spasms.  This has been a urine infection in the past and he is at high risk due to his suprapubic catheter.  Will obtain labs and UA for evaluation.  He was given IVF.    1:15 AM UA reveals  an acute infection.  His last culture was pansensitive.  Will treat with 1 week of kelfex for complicated UTI.  First dose given tonight in the ED.  Family comfortable with taking the patient home and do not feel he is too weak.  He appears well and in NAD.  Vs remain within his normal limits and he is safe for DC.  Final Clinical Impressions(s) / ED  Diagnoses   Final diagnoses:  Acute cystitis without hematuria    New Prescriptions New Prescriptions   CEPHALEXIN (KEFLEX) 500 MG CAPSULE    Take 1 capsule (500 mg total) by mouth 2 (two) times daily.   I personally performed the services described in this documentation, which was scribed in my presence. The recorded information has been reviewed and is accurate.       Everlene Balls, MD 01/13/16 [phone]

## 2016-01-12 NOTE — ED Triage Notes (Signed)
Pt. reports fever , chills and fatigue onset this evening . He has a suprapubic catheter that was changed 2 weeks ago .

## 2016-01-13 LAB — COMPREHENSIVE METABOLIC PANEL
ALT: 17 U/L (ref 17–63)
AST: 27 U/L (ref 15–41)
Albumin: 3.8 g/dL (ref 3.5–5.0)
Alkaline Phosphatase: 66 U/L (ref 38–126)
Anion gap: 8 (ref 5–15)
BUN: 21 mg/dL — ABNORMAL HIGH (ref 6–20)
CO2: 23 mmol/L (ref 22–32)
Calcium: 9.3 mg/dL (ref 8.9–10.3)
Chloride: 104 mmol/L (ref 101–111)
Creatinine, Ser: 1.14 mg/dL (ref 0.61–1.24)
GFR calc Af Amer: >60 mL/min (ref >60)
GFR calc non Af Amer: 56 mL/min — ABNORMAL LOW (ref >60)
Glucose, Bld: 138 mg/dL — ABNORMAL HIGH (ref 65–99)
Potassium: 3.9 mmol/L (ref 3.5–5.1)
Sodium: 135 mmol/L (ref 135–145)
Total Bilirubin: 1.4 mg/dL — ABNORMAL HIGH (ref 0.3–1.2)
Total Protein: 6.4 g/dL — ABNORMAL LOW (ref 6.5–8.1)

## 2016-01-13 LAB — URINE MICROSCOPIC-ADD ON

## 2016-01-13 LAB — URINALYSIS, ROUTINE W REFLEX MICROSCOPIC
Bilirubin Urine: NEGATIVE
Glucose, UA: NEGATIVE mg/dL
Ketones, ur: NEGATIVE mg/dL
Nitrite: POSITIVE — AB
Protein, ur: NEGATIVE mg/dL
Specific Gravity, Urine: 1.012 (ref 1.005–1.030)
pH: 8.5 — ABNORMAL HIGH (ref 5.0–8.0)

## 2016-01-13 MED ORDER — CEPHALEXIN 250 MG PO CAPS
500.0000 mg | ORAL_CAPSULE | Freq: Once | ORAL | Status: AC
  Administered 2016-01-13: 500 mg via ORAL
  Filled 2016-01-13: qty 2

## 2016-01-13 MED ORDER — CEPHALEXIN 500 MG PO CAPS: 500.0000 mg | ORAL_CAPSULE | Freq: Two times a day (BID) | ORAL | 0 refills | Status: DC

## 2016-01-13 MED ORDER — KETOROLAC TROMETHAMINE 30 MG/ML IJ SOLN
15.0000 mg | Freq: Once | INTRAMUSCULAR | Status: AC
  Administered 2016-01-13: 15 mg via INTRAVENOUS
  Filled 2016-01-13: qty 1

## 2016-01-13 NOTE — ED Notes (Signed)
Patient Alert and oriented X4. Stable and ambulatory. Patient verbalized understanding of the discharge instructions.  Patient belongings were taken by the patient.  

## 2016-01-14 ENCOUNTER — Inpatient Hospital Stay (HOSPITAL_COMMUNITY)
Admission: EM | Admit: 2016-01-14 | Discharge: 2016-01-21 | DRG: 698 | Payer: Medicare Other | Attending: Internal Medicine | Admitting: Internal Medicine

## 2016-01-14 ENCOUNTER — Inpatient Hospital Stay (HOSPITAL_COMMUNITY): Payer: Medicare Other

## 2016-01-14 ENCOUNTER — Emergency Department (HOSPITAL_COMMUNITY): Payer: Medicare Other

## 2016-01-14 DIAGNOSIS — Z87442 Personal history of urinary calculi: Secondary | ICD-10-CM | POA: Diagnosis not present

## 2016-01-14 DIAGNOSIS — Z66 Do not resuscitate: Secondary | ICD-10-CM | POA: Diagnosis present

## 2016-01-14 DIAGNOSIS — R000 Tachycardia, unspecified: Secondary | ICD-10-CM | POA: Diagnosis present

## 2016-01-14 DIAGNOSIS — Z888 Allergy status to other drugs, medicaments and biological substances status: Secondary | ICD-10-CM

## 2016-01-14 DIAGNOSIS — I5022 Chronic systolic (congestive) heart failure: Secondary | ICD-10-CM | POA: Diagnosis not present

## 2016-01-14 DIAGNOSIS — R7989 Other specified abnormal findings of blood chemistry: Secondary | ICD-10-CM | POA: Diagnosis not present

## 2016-01-14 DIAGNOSIS — K219 Gastro-esophageal reflux disease without esophagitis: Secondary | ICD-10-CM | POA: Diagnosis present

## 2016-01-14 DIAGNOSIS — D72820 Lymphocytosis (symptomatic): Secondary | ICD-10-CM | POA: Diagnosis not present

## 2016-01-14 DIAGNOSIS — R220 Localized swelling, mass and lump, head: Secondary | ICD-10-CM | POA: Diagnosis not present

## 2016-01-14 DIAGNOSIS — I252 Old myocardial infarction: Secondary | ICD-10-CM | POA: Diagnosis not present

## 2016-01-14 DIAGNOSIS — J9811 Atelectasis: Secondary | ICD-10-CM | POA: Diagnosis present

## 2016-01-14 DIAGNOSIS — R7881 Bacteremia: Secondary | ICD-10-CM

## 2016-01-14 DIAGNOSIS — Z7982 Long term (current) use of aspirin: Secondary | ICD-10-CM

## 2016-01-14 DIAGNOSIS — R7401 Elevation of levels of liver transaminase levels: Secondary | ICD-10-CM

## 2016-01-14 DIAGNOSIS — Z961 Presence of intraocular lens: Secondary | ICD-10-CM | POA: Diagnosis present

## 2016-01-14 DIAGNOSIS — A4152 Sepsis due to Pseudomonas: Secondary | ICD-10-CM | POA: Diagnosis not present

## 2016-01-14 DIAGNOSIS — T83511A Infection and inflammatory reaction due to indwelling urethral catheter, initial encounter: Secondary | ICD-10-CM | POA: Diagnosis present

## 2016-01-14 DIAGNOSIS — Z87891 Personal history of nicotine dependence: Secondary | ICD-10-CM

## 2016-01-14 DIAGNOSIS — R740 Nonspecific elevation of levels of transaminase and lactic acid dehydrogenase [LDH]: Secondary | ICD-10-CM | POA: Diagnosis present

## 2016-01-14 DIAGNOSIS — Z8669 Personal history of other diseases of the nervous system and sense organs: Secondary | ICD-10-CM

## 2016-01-14 DIAGNOSIS — I110 Hypertensive heart disease with heart failure: Secondary | ICD-10-CM | POA: Diagnosis present

## 2016-01-14 DIAGNOSIS — I129 Hypertensive chronic kidney disease with stage 1 through stage 4 chronic kidney disease, or unspecified chronic kidney disease: Secondary | ICD-10-CM | POA: Diagnosis present

## 2016-01-14 DIAGNOSIS — N182 Chronic kidney disease, stage 2 (mild): Secondary | ICD-10-CM | POA: Diagnosis present

## 2016-01-14 DIAGNOSIS — B001 Herpesviral vesicular dermatitis: Secondary | ICD-10-CM | POA: Diagnosis not present

## 2016-01-14 DIAGNOSIS — J9691 Respiratory failure, unspecified with hypoxia: Secondary | ICD-10-CM | POA: Diagnosis present

## 2016-01-14 DIAGNOSIS — A419 Sepsis, unspecified organism: Secondary | ICD-10-CM | POA: Diagnosis not present

## 2016-01-14 DIAGNOSIS — N3020 Other chronic cystitis without hematuria: Secondary | ICD-10-CM | POA: Diagnosis present

## 2016-01-14 DIAGNOSIS — Z833 Family history of diabetes mellitus: Secondary | ICD-10-CM

## 2016-01-14 DIAGNOSIS — E873 Alkalosis: Secondary | ICD-10-CM | POA: Diagnosis present

## 2016-01-14 DIAGNOSIS — Z9841 Cataract extraction status, right eye: Secondary | ICD-10-CM

## 2016-01-14 DIAGNOSIS — R001 Bradycardia, unspecified: Secondary | ICD-10-CM | POA: Diagnosis present

## 2016-01-14 DIAGNOSIS — N281 Cyst of kidney, acquired: Secondary | ICD-10-CM | POA: Diagnosis not present

## 2016-01-14 DIAGNOSIS — R159 Full incontinence of feces: Secondary | ICD-10-CM

## 2016-01-14 DIAGNOSIS — N3281 Overactive bladder: Secondary | ICD-10-CM | POA: Diagnosis present

## 2016-01-14 DIAGNOSIS — Z9889 Other specified postprocedural states: Secondary | ICD-10-CM

## 2016-01-14 DIAGNOSIS — N178 Other acute kidney failure: Secondary | ICD-10-CM

## 2016-01-14 DIAGNOSIS — G9340 Encephalopathy, unspecified: Secondary | ICD-10-CM | POA: Diagnosis present

## 2016-01-14 DIAGNOSIS — Z955 Presence of coronary angioplasty implant and graft: Secondary | ICD-10-CM | POA: Diagnosis not present

## 2016-01-14 DIAGNOSIS — G510 Bell's palsy: Secondary | ICD-10-CM | POA: Diagnosis present

## 2016-01-14 DIAGNOSIS — Z8546 Personal history of malignant neoplasm of prostate: Secondary | ICD-10-CM

## 2016-01-14 DIAGNOSIS — K120 Recurrent oral aphthae: Secondary | ICD-10-CM | POA: Diagnosis present

## 2016-01-14 DIAGNOSIS — R109 Unspecified abdominal pain: Secondary | ICD-10-CM | POA: Diagnosis not present

## 2016-01-14 DIAGNOSIS — N179 Acute kidney failure, unspecified: Secondary | ICD-10-CM | POA: Diagnosis not present

## 2016-01-14 DIAGNOSIS — E8809 Other disorders of plasma-protein metabolism, not elsewhere classified: Secondary | ICD-10-CM

## 2016-01-14 DIAGNOSIS — I10 Essential (primary) hypertension: Secondary | ICD-10-CM | POA: Diagnosis present

## 2016-01-14 DIAGNOSIS — I255 Ischemic cardiomyopathy: Secondary | ICD-10-CM | POA: Diagnosis present

## 2016-01-14 DIAGNOSIS — Z882 Allergy status to sulfonamides status: Secondary | ICD-10-CM

## 2016-01-14 DIAGNOSIS — Z9049 Acquired absence of other specified parts of digestive tract: Secondary | ICD-10-CM

## 2016-01-14 DIAGNOSIS — E872 Acidosis: Secondary | ICD-10-CM | POA: Diagnosis not present

## 2016-01-14 DIAGNOSIS — Z87440 Personal history of urinary (tract) infections: Secondary | ICD-10-CM

## 2016-01-14 DIAGNOSIS — R5381 Other malaise: Secondary | ICD-10-CM | POA: Diagnosis not present

## 2016-01-14 DIAGNOSIS — Z85820 Personal history of malignant melanoma of skin: Secondary | ICD-10-CM | POA: Diagnosis not present

## 2016-01-14 DIAGNOSIS — R112 Nausea with vomiting, unspecified: Secondary | ICD-10-CM | POA: Diagnosis not present

## 2016-01-14 DIAGNOSIS — Z951 Presence of aortocoronary bypass graft: Secondary | ICD-10-CM | POA: Diagnosis not present

## 2016-01-14 DIAGNOSIS — D709 Neutropenia, unspecified: Secondary | ICD-10-CM | POA: Diagnosis present

## 2016-01-14 DIAGNOSIS — B370 Candidal stomatitis: Secondary | ICD-10-CM | POA: Diagnosis present

## 2016-01-14 DIAGNOSIS — I214 Non-ST elevation (NSTEMI) myocardial infarction: Secondary | ICD-10-CM | POA: Diagnosis not present

## 2016-01-14 DIAGNOSIS — N359 Urethral stricture, unspecified: Secondary | ICD-10-CM | POA: Diagnosis present

## 2016-01-14 DIAGNOSIS — Z79899 Other long term (current) drug therapy: Secondary | ICD-10-CM

## 2016-01-14 DIAGNOSIS — R4182 Altered mental status, unspecified: Secondary | ICD-10-CM | POA: Diagnosis present

## 2016-01-14 DIAGNOSIS — R17 Unspecified jaundice: Secondary | ICD-10-CM | POA: Diagnosis present

## 2016-01-14 DIAGNOSIS — N390 Urinary tract infection, site not specified: Secondary | ICD-10-CM | POA: Diagnosis not present

## 2016-01-14 DIAGNOSIS — E46 Unspecified protein-calorie malnutrition: Secondary | ICD-10-CM

## 2016-01-14 DIAGNOSIS — R748 Abnormal levels of other serum enzymes: Secondary | ICD-10-CM | POA: Diagnosis not present

## 2016-01-14 DIAGNOSIS — Y846 Urinary catheterization as the cause of abnormal reaction of the patient, or of later complication, without mention of misadventure at the time of the procedure: Secondary | ICD-10-CM | POA: Diagnosis present

## 2016-01-14 DIAGNOSIS — Z960 Presence of urogenital implants: Secondary | ICD-10-CM | POA: Diagnosis present

## 2016-01-14 DIAGNOSIS — T83510A Infection and inflammatory reaction due to cystostomy catheter, initial encounter: Secondary | ICD-10-CM | POA: Diagnosis not present

## 2016-01-14 DIAGNOSIS — Z8249 Family history of ischemic heart disease and other diseases of the circulatory system: Secondary | ICD-10-CM

## 2016-01-14 DIAGNOSIS — I25810 Atherosclerosis of coronary artery bypass graft(s) without angina pectoris: Secondary | ICD-10-CM | POA: Diagnosis present

## 2016-01-14 DIAGNOSIS — E785 Hyperlipidemia, unspecified: Secondary | ICD-10-CM | POA: Diagnosis present

## 2016-01-14 DIAGNOSIS — D696 Thrombocytopenia, unspecified: Secondary | ICD-10-CM | POA: Diagnosis present

## 2016-01-14 DIAGNOSIS — B965 Pseudomonas (aeruginosa) (mallei) (pseudomallei) as the cause of diseases classified elsewhere: Secondary | ICD-10-CM | POA: Diagnosis present

## 2016-01-14 DIAGNOSIS — N19 Unspecified kidney failure: Secondary | ICD-10-CM

## 2016-01-14 DIAGNOSIS — R6521 Severe sepsis with septic shock: Secondary | ICD-10-CM | POA: Diagnosis not present

## 2016-01-14 DIAGNOSIS — Z9359 Other cystostomy status: Secondary | ICD-10-CM

## 2016-01-14 DIAGNOSIS — K1379 Other lesions of oral mucosa: Secondary | ICD-10-CM | POA: Diagnosis not present

## 2016-01-14 DIAGNOSIS — F0390 Unspecified dementia without behavioral disturbance: Secondary | ICD-10-CM | POA: Diagnosis present

## 2016-01-14 DIAGNOSIS — I2510 Atherosclerotic heart disease of native coronary artery without angina pectoris: Secondary | ICD-10-CM | POA: Diagnosis not present

## 2016-01-14 DIAGNOSIS — J101 Influenza due to other identified influenza virus with other respiratory manifestations: Secondary | ICD-10-CM | POA: Diagnosis present

## 2016-01-14 DIAGNOSIS — Z9842 Cataract extraction status, left eye: Secondary | ICD-10-CM

## 2016-01-14 DIAGNOSIS — J9601 Acute respiratory failure with hypoxia: Secondary | ICD-10-CM

## 2016-01-14 DIAGNOSIS — T82898A Other specified complication of vascular prosthetic devices, implants and grafts, initial encounter: Secondary | ICD-10-CM | POA: Diagnosis present

## 2016-01-14 DIAGNOSIS — Z974 Presence of external hearing-aid: Secondary | ICD-10-CM | POA: Diagnosis not present

## 2016-01-14 DIAGNOSIS — D649 Anemia, unspecified: Secondary | ICD-10-CM | POA: Diagnosis present

## 2016-01-14 DIAGNOSIS — I509 Heart failure, unspecified: Secondary | ICD-10-CM | POA: Diagnosis not present

## 2016-01-14 DIAGNOSIS — J9690 Respiratory failure, unspecified, unspecified whether with hypoxia or hypercapnia: Secondary | ICD-10-CM | POA: Diagnosis not present

## 2016-01-14 LAB — URINALYSIS, ROUTINE W REFLEX MICROSCOPIC
Glucose, UA: NEGATIVE mg/dL
Ketones, ur: 15 mg/dL — AB
Nitrite: NEGATIVE
Protein, ur: 30 mg/dL — AB
Specific Gravity, Urine: 1.011 (ref 1.005–1.030)
pH: 8 (ref 5.0–8.0)

## 2016-01-14 LAB — APTT: aPTT: 26 seconds (ref 24–36)

## 2016-01-14 LAB — CBC WITH DIFFERENTIAL/PLATELET
Basophils Absolute: 0 10*3/uL (ref 0.0–0.1)
Basophils Relative: 0 %
Eosinophils Absolute: 0 10*3/uL (ref 0.0–0.7)
Eosinophils Relative: 0 %
HCT: 45 % (ref 39.0–52.0)
Hemoglobin: 15.3 g/dL (ref 13.0–17.0)
Lymphocytes Relative: 2 %
Lymphs Abs: 0.1 10*3/uL — ABNORMAL LOW (ref 0.7–4.0)
MCH: 30.9 pg (ref 26.0–34.0)
MCHC: 34 g/dL (ref 30.0–36.0)
MCV: 90.9 fL (ref 78.0–100.0)
Monocytes Absolute: 0 10*3/uL — ABNORMAL LOW (ref 0.1–1.0)
Monocytes Relative: 1 %
Neutro Abs: 3.4 10*3/uL (ref 1.7–7.7)
Neutrophils Relative %: 97 %
Platelets: 55 10*3/uL — ABNORMAL LOW (ref 150–400)
RBC: 4.95 MIL/uL (ref 4.22–5.81)
RDW: 13.8 % (ref 11.5–15.5)
WBC: 3.5 10*3/uL — ABNORMAL LOW (ref 4.0–10.5)

## 2016-01-14 LAB — COMPREHENSIVE METABOLIC PANEL
ALT: 53 U/L (ref 17–63)
AST: 84 U/L — ABNORMAL HIGH (ref 15–41)
Albumin: 3.6 g/dL (ref 3.5–5.0)
Alkaline Phosphatase: 187 U/L — ABNORMAL HIGH (ref 38–126)
Anion gap: 13 (ref 5–15)
BUN: 28 mg/dL — ABNORMAL HIGH (ref 6–20)
CO2: 21 mmol/L — ABNORMAL LOW (ref 22–32)
Calcium: 9.7 mg/dL (ref 8.9–10.3)
Chloride: 100 mmol/L — ABNORMAL LOW (ref 101–111)
Creatinine, Ser: 1.78 mg/dL — ABNORMAL HIGH (ref 0.61–1.24)
GFR calc Af Amer: 38 mL/min — ABNORMAL LOW (ref >60)
GFR calc non Af Amer: 33 mL/min — ABNORMAL LOW (ref >60)
Glucose, Bld: 149 mg/dL — ABNORMAL HIGH (ref 65–99)
Potassium: 4.3 mmol/L (ref 3.5–5.1)
Sodium: 134 mmol/L — ABNORMAL LOW (ref 135–145)
Total Bilirubin: 2.3 mg/dL — ABNORMAL HIGH (ref 0.3–1.2)
Total Protein: 6.7 g/dL (ref 6.5–8.1)

## 2016-01-14 LAB — I-STAT CG4 LACTIC ACID, ED: Lactic Acid, Venous: 4.61 mmol/L (ref 0.5–1.9)

## 2016-01-14 LAB — URINE MICROSCOPIC-ADD ON: Squamous Epithelial / LPF: NONE SEEN

## 2016-01-14 LAB — GLUCOSE, CAPILLARY: Glucose-Capillary: 159 mg/dL — ABNORMAL HIGH (ref 65–99)

## 2016-01-14 LAB — STREP PNEUMONIAE URINARY ANTIGEN: Strep Pneumo Urinary Antigen: NEGATIVE

## 2016-01-14 LAB — PROTIME-INR
INR: 1.21
Prothrombin Time: 15.3 seconds — ABNORMAL HIGH (ref 11.4–15.2)

## 2016-01-14 LAB — LACTIC ACID, PLASMA
Lactic Acid, Venous: 3.3 mmol/L (ref 0.5–1.9)
Lactic Acid, Venous: 3.3 mmol/L (ref 0.5–1.9)
Lactic Acid, Venous: 3.2 mmol/L (ref 0.5–1.9)

## 2016-01-14 LAB — LIPASE, BLOOD: Lipase: 17 U/L (ref 11–51)

## 2016-01-14 LAB — URINE CULTURE

## 2016-01-14 LAB — TROPONIN I
Troponin I: 4.06 ng/mL (ref <0.03)
Troponin I: 4.33 ng/mL (ref <0.03)

## 2016-01-14 LAB — INFLUENZA PANEL BY PCR (TYPE A & B)
H1N1 flu by pcr: NOT DETECTED
Influenza A By PCR: NEGATIVE
Influenza B By PCR: NEGATIVE

## 2016-01-14 LAB — I-STAT ARTERIAL BLOOD GAS, ED
Acid-base deficit: 7 mmol/L — ABNORMAL HIGH (ref 0.0–2.0)
Bicarbonate: 16 mmol/L — ABNORMAL LOW (ref 20.0–28.0)
O2 Saturation: 87 %
Patient temperature: 98
TCO2: 17 mmol/L (ref 0–100)
pCO2 arterial: 24.7 mmHg — ABNORMAL LOW (ref 32.0–48.0)
pH, Arterial: 7.417 (ref 7.350–7.450)
pO2, Arterial: 50 mmHg — ABNORMAL LOW (ref 83.0–108.0)

## 2016-01-14 LAB — CBG MONITORING, ED: Glucose-Capillary: 138 mg/dL — ABNORMAL HIGH (ref 65–99)

## 2016-01-14 LAB — CORTISOL
Cortisol, Plasma: >100 ug/dL
Cortisol, Plasma: >100 ug/dL

## 2016-01-14 LAB — AMYLASE: Amylase: 36 U/L (ref 28–100)

## 2016-01-14 LAB — MRSA PCR SCREENING: MRSA by PCR: NEGATIVE

## 2016-01-14 LAB — FIBRINOGEN: Fibrinogen: 605 mg/dL — ABNORMAL HIGH (ref 210–475)

## 2016-01-14 LAB — PROCALCITONIN: Procalcitonin: 3.12 ng/mL

## 2016-01-14 LAB — MAGNESIUM: Magnesium: 1.6 mg/dL — ABNORMAL LOW (ref 1.7–2.4)

## 2016-01-14 LAB — PHOSPHORUS: Phosphorus: 2.2 mg/dL — ABNORMAL LOW (ref 2.5–4.6)

## 2016-01-14 LAB — BRAIN NATRIURETIC PEPTIDE: B Natriuretic Peptide: 719.7 pg/mL — ABNORMAL HIGH (ref 0.0–100.0)

## 2016-01-14 MED ORDER — SODIUM CHLORIDE 0.9 % IV SOLN
INTRAVENOUS | Status: DC
  Administered 2016-01-14: 15:00:00 via INTRAVENOUS

## 2016-01-14 MED ORDER — NOREPINEPHRINE BITARTRATE 1 MG/ML IV SOLN
0.0000 ug/min | INTRAVENOUS | Status: AC
  Administered 2016-01-14: 2 ug/min via INTRAVENOUS
  Filled 2016-01-14: qty 4

## 2016-01-14 MED ORDER — SODIUM CHLORIDE 0.9 % IV BOLUS (SEPSIS)
500.0000 mL | Freq: Once | INTRAVENOUS | Status: AC
  Administered 2016-01-14: 500 mL via INTRAVENOUS

## 2016-01-14 MED ORDER — VANCOMYCIN HCL IN DEXTROSE 1-5 GM/200ML-% IV SOLN: 1000.0000 mg | Freq: Once | INTRAVENOUS | Status: DC

## 2016-01-14 MED ORDER — PIPERACILLIN-TAZOBACTAM 3.375 G IVPB 30 MIN
3.3750 g | Freq: Once | INTRAVENOUS | Status: AC
  Administered 2016-01-14: 3.375 g via INTRAVENOUS
  Filled 2016-01-14: qty 50

## 2016-01-14 MED ORDER — IBUPROFEN 400 MG PO TABS
600.0000 mg | ORAL_TABLET | Freq: Once | ORAL | Status: AC
  Administered 2016-01-14: 600 mg via ORAL
  Filled 2016-01-14: qty 1

## 2016-01-14 MED ORDER — SODIUM CHLORIDE 0.9 % IV SOLN
INTRAVENOUS | Status: DC
Start: 2016-01-14 — End: 2016-01-14

## 2016-01-14 MED ORDER — SODIUM CHLORIDE 0.9 % IV SOLN
1500.0000 mg | Freq: Once | INTRAVENOUS | Status: AC
  Administered 2016-01-14: 1500 mg via INTRAVENOUS
  Filled 2016-01-14: qty 1500

## 2016-01-14 MED ORDER — NOREPINEPHRINE BITARTRATE 1 MG/ML IV SOLN
0.0000 ug/min | INTRAVENOUS | Status: DC
  Administered 2016-01-15: 2 ug/min via INTRAVENOUS
  Filled 2016-01-14 (×2): qty 4

## 2016-01-14 MED ORDER — VANCOMYCIN HCL IN DEXTROSE 750-5 MG/150ML-% IV SOLN
750.0000 mg | INTRAVENOUS | Status: DC
Start: 2016-01-15 — End: 2016-01-15
  Filled 2016-01-14: qty 150

## 2016-01-14 MED ORDER — SODIUM CHLORIDE 0.9 % IV SOLN: 250.0000 mL | INTRAVENOUS | Status: DC | PRN

## 2016-01-14 MED ORDER — SODIUM CHLORIDE 0.9 % IV BOLUS (SEPSIS)
1000.0000 mL | Freq: Once | INTRAVENOUS | Status: AC
  Administered 2016-01-14: 1000 mL via INTRAVENOUS

## 2016-01-14 MED ORDER — ACETAMINOPHEN 325 MG PO TABS: 650.0000 mg | ORAL_TABLET | ORAL | Status: DC | PRN

## 2016-01-14 MED ORDER — SODIUM CHLORIDE 0.9 % IV BOLUS (SEPSIS)
750.0000 mL | Freq: Once | INTRAVENOUS | Status: AC
  Administered 2016-01-14: 750 mL via INTRAVENOUS

## 2016-01-14 MED ORDER — PIPERACILLIN-TAZOBACTAM 3.375 G IVPB
3.3750 g | Freq: Three times a day (TID) | INTRAVENOUS | Status: DC
  Administered 2016-01-14 – 2016-01-15 (×2): 3.375 g via INTRAVENOUS
  Filled 2016-01-14 (×4): qty 50

## 2016-01-14 MED ORDER — ACETAMINOPHEN 500 MG PO TABS
1000.0000 mg | ORAL_TABLET | Freq: Once | ORAL | Status: AC
  Administered 2016-01-14: 1000 mg via ORAL
  Filled 2016-01-14: qty 2

## 2016-01-14 MED ORDER — ALBUTEROL SULFATE (2.5 MG/3ML) 0.083% IN NEBU: 2.5000 mg | INHALATION_SOLUTION | RESPIRATORY_TRACT | Status: DC | PRN

## 2016-01-14 MED ORDER — PIPERACILLIN-TAZOBACTAM 3.375 G IVPB 30 MIN: 3.3750 g | Freq: Once | INTRAVENOUS | Status: DC

## 2016-01-14 MED ORDER — SODIUM CHLORIDE 0.9 % IV BOLUS (SEPSIS)
250.0000 mL | Freq: Once | INTRAVENOUS | Status: AC
  Administered 2016-01-14: 250 mL via INTRAVENOUS

## 2016-01-14 MED ORDER — ONDANSETRON HCL 4 MG/2ML IJ SOLN: 4.0000 mg | Freq: Four times a day (QID) | INTRAMUSCULAR | Status: DC | PRN

## 2016-01-14 MED ORDER — ORAL CARE MOUTH RINSE
15.0000 mL | Freq: Two times a day (BID) | OROMUCOSAL | Status: DC
  Administered 2016-01-14 – 2016-01-21 (×10): 15 mL via OROMUCOSAL

## 2016-01-14 NOTE — ED Triage Notes (Signed)
Patient comes in with AMS starting last night and c/o abd pain. Patient had recent UTI. [facility] noted patient vomited x1. EMS gave 4 of zofran. EMS EKG LBBB. EMS v/s 110 HR, 113/60, 18 RR, 95% 2LO2 acute. fsbs 190.

## 2016-01-14 NOTE — ED Provider Notes (Signed)
[facility] DEPT Provider Note   CSN: [id number] Arrival date & time: 01/14/16  1005     History   Chief Complaint Chief Complaint  Patient presents with  . Altered Mental Status  . Emesis    HPI Ronald [name] is a 82 y.o. male.  HPI   Patient is a 82 year old male with history of CHF CAD hypertension hyperlipidemia presenting with fever. Patient had recent diagnosis of UTI 30 hours ago and started on Keflex. Patient continued to have fevers at home is becoming increasingly ill appearing.  Level V caveat altered mental status.  Past Medical History:  Diagnosis Date  . Arthritis    "joints ache" (01/02/2014)  . At risk for sleep apnea    STOP-BANG= 4    SENT TO PCP 12-23-2013  . Bladder calculi   . CHF (congestive heart failure) ([place])   . Chronic cystitis   . Coronary artery disease CARDIOLOGIST-  DR Grace Bushy  MI -- S/P  11/89 CABG x6 (70% circ; 90% PD; 60-70% distal left main; 30% left circ; 90% 1st diag;, 2nd diag and 3rd diag. w/90%,  mild stenosis LAD and 60-70% pLAD)  Re-do CABG x5 in 1997  . Diverticulosis   . GERD (gastroesophageal reflux disease)   . History of Bell's palsy    RIGHT SIDE-- NO RESIDUAL  . Hx of dizziness   . Hyperlipidemia   . Hypertension    "not anymore" (01/02/2014)  . Ischemic cardiomyopathy    ef 35-40% per cath 08-28-2013  . Kidney stones "years ago"   "passed them"  . Melanoma of ear ([place])    "right"  . Mild dementia   . Myocardial infarction 1986; 1997  . Nocturia   . OAB (overactive bladder)   . Prostate cancer ([place]) 1998   S/P  [place]; [place]  . S/P CABG (coronary artery bypass graft)    [place]  . Skipped heart beats    occasional  . Urethral stricture   . UTI (urinary tract infection) 09/2015  . Wears hearing aid    bilateral  . Wears partial dentures     Patient Active Problem List   Diagnosis Date Noted  . Altered mental state 01/14/2016  . Generalized  weakness 10/13/2015  . ARF (acute renal failure) ([place]) 10/13/2015  . Sepsis ([place]) 12/05/2014  . UTI (urinary tract infection) 10/03/2014  . UTI (lower urinary tract infection) 09/10/2014  . Fever 09/10/2014  . Urinary frequency 06/30/2014  . Bacteremia due to Enterococcus 01/22/2014  . SIRS (systemic inflammatory response syndrome) ([place]) 10-27-202015  . Chronic systolic CHF (congestive heart failure) ([place]) 10-27-202015  . Urethral stricture 12/25/2013  . Bladder calculus 12/25/2013  . LV dysfunction 09/23/2013  . Chest pain 08/27/2013  . Unstable angina ([place]) 08/27/2013  . Bacteremia due to Escherichia coli 05/20/2012  . Urinary tract infection 09/15/2011  . Calculus of kidney 09/12/2011  . Bradycardia 10/26/2010  . CHEST WALL PAIN, ACUTE 09/04/2009  . CHILLS WITHOUT FEVER 02/10/2009  . HYPERCHOLESTEROLEMIA 10/18/2007  . HTN (hypertension) 10/18/2007  . CELLULITIS, FOOT, LEFT 02/27/2007  . OSTEOARTHRITIS 02/07/2007  . ABDOMINAL PAIN 01/01/2007  . BELL'S PALSY, RIGHT 10/26/2006  . Coronary atherosclerosis 10/26/2006  . PROSTATE CANCER, HX OF 10/26/2006  . NEPHROLITHIASIS, HX OF 10/26/2006    Past Surgical History:  Procedure Laterality Date  . CARDIAC CATHETERIZATION  02-22-2005  dr Vidal Schwalbe   mild to moderate lv dysfunction with  inferobasilar akinesis/  totally occluded SVG to Intermediate Diagonal and SVG to PDA and RCA branches, totally occluded native coronary circulation with diffuse disease pLAD diagonal system with potentially could be ischemic/  patent SVG to OM with collaterals to dRCA and patent LIMA to LAD and diagonal systemss  . CARDIAC CATHETERIZATION  08-28-2013  DR Daneen Schick   widely patent sequential left internal graft to the diagonal/LAD, widely patent SVG to OM with proximal 50% narrowing noted in the graft, total occlusion SVG's to RCA, RI, and the Diagonal/ LV dysfunction with inferobasal aneurysm and mid anterior wall region of akinesis/  overall EF 35-40%/   Total occlusion of the navtive circulation/  no significant change compared to 2006 cath  . CARDIOVASCULAR STRESS TEST  08-01-2011  dr Angelena Form   inferior scar and possible soft tissue attenuation with minimal peri-infarct ischemia, small region of anterior ischemia and scar/  LVEF 53% LV wall motion with inferior hypokinesis/ no significant change from scan july 2011  . CATARACT EXTRACTION W/ INTRAOCULAR LENS  IMPLANT, BILATERAL Bilateral 2007  . CORONARY ARTERY BYPASS GRAFT  11/ [place]-- 6 vessel/  1997 Re-do 5 vessel  . CYSTOSCOPY WITH URETHRAL DILATATION N/A 12/25/2013   Procedure: CYSTOSCOPY WITH URETHRAL DILATATION, WITH BIOPSY;  Surgeon: Bernestine Amass, MD;  Location: [facility];  Service: Urology;  Laterality: N/A;  . CYSTOSCOPY WITH URETHRAL DILATATION N/A 12/22/2014   Procedure: CYSTOSCOPY WITH URETHRAL DILATATION;  Surgeon: Rana Snare, MD;  Location: [facility];  Service: Urology;  Laterality: N/A;  BALLOON DILATION CATHETER    . EUS  10/05/2011   Procedure: ESOPHAGEAL ENDOSCOPIC ULTRASOUND (EUS) RADIAL;  Surgeon: Arta Silence, MD;  Location: [facility];  Service: Endoscopy;  Laterality: N/A;  . INSERTION OF SUPRAPUBIC CATHETER N/A 12/22/2014   Procedure: INSERTION OF SUPRAPUBIC CATHETER ;  Surgeon: Rana Snare, MD;  Location: [facility];  Service: Urology;  Laterality: N/A;  . LAPAROSCOPIC CHOLECYSTECTOMY  12-23-2005  . LEFT HEART CATHETERIZATION WITH CORONARY/GRAFT ANGIOGRAM N/A 08/28/2013   Procedure: LEFT HEART CATHETERIZATION WITH Beatrix Fetters;  Surgeon: Sinclair Grooms, MD;  Location: [facility] C[facility] LAB;  Service: Cardiovascular;  Laterality: N/A;  . MELANOMA EXCISION  X 1   "ear"  . RADIOACTIVE PROSTATE SEED IMPLANTS  1998  . SKIN CANCER EXCISION  X 2   "top of head"  . TONSILLECTOMY AND ADENOIDECTOMY  1954       Home Medications    Prior to Admission medications   Medication Sig Start Date End Date Taking? Authorizing Provider    acetaminophen (TYLENOL) 325 MG tablet Take 325 mg by mouth every 4 (four) hours as needed for fever.   Yes Historical Provider, MD  acetaminophen (TYLENOL) 500 MG tablet Take 500 mg by mouth every 6 (six) hours as needed for pain or fever.    Yes Historical Provider, MD  aspirin EC 81 MG EC tablet Take 1 tablet (81 mg total) by mouth daily. 08/29/13  Yes Brittainy Erie Noe, PA-C  atorvastatin (LIPITOR) 80 MG tablet Take 40 mg by mouth every evening. 09/23/13  Yes Imogene Burn, PA-C  cephALEXin (KEFLEX) 500 MG capsule Take 1 capsule (500 mg total) by mouth 2 (two) times daily. 01/13/16  Yes Everlene Balls, MD  Cholecalciferol (VITAMIN D) 2000 UNITS tablet Take 2,000 Units by mouth daily.   Yes Historical Provider, MD  flavoxATE (URISPAS) 100 MG tablet Take 100 mg by mouth 3 (three) times daily as  needed for bladder spasms.   Yes Historical Provider, MD  galantamine (RAZADYNE ER) 8 MG 24 hr capsule Take 8 mg by mouth daily with breakfast.   Yes Historical Provider, MD  HYDROcodone-acetaminophen (NORCO/VICODIN) 5-325 MG tablet Take 1 tablet by mouth every 6 (six) hours as needed for moderate pain.   Yes Historical Provider, MD  hydroxypropyl methylcellulose (ISOPTO TEARS) 2.5 % ophthalmic solution Place 1 drop into both eyes 2 (two) times daily.    Yes Historical Provider, MD  isosorbide mononitrate (IMDUR) 60 MG 24 hr tablet Take 0.5 tablets (30 mg total) by mouth every morning. 01/07/14  Yes Shanker Kristeen Mans, MD  Multiple Vitamin (MULTIVITAMIN WITH MINERALS) TABS Take 1 tablet by mouth daily.   Yes Historical Provider, MD  nitroGLYCERIN (NITROSTAT) 0.4 MG SL tablet Place 0.4 mg under the tongue every 5 (five) minutes as needed for chest pain.    Yes Historical Provider, MD    Family History Family History  Problem Relation Age of Onset  . Heart disease Mother   . Diabetes Father   . Heart disease Brother     Social History Social History  Substance Use Topics  . Smoking status: Former  Smoker    Years: 40.00    Types: Cigars    Quit date: 12/24/1983  . Smokeless tobacco: Former Systems developer    Types: Chew    Quit date: 08/28/2013  . Alcohol use No     Allergies   Pantoprazole; Nitrofurantoin; and Sulfa antibiotics   Review of Systems Review of Systems  Unable to perform ROS: Mental status change     Physical Exam Updated Vital Signs BP (!) 78/49   Pulse (!) 48   Temp 101.8 F (38.8 C) (Oral)   Resp 21   Ht 5\' 7"  (1.702 m)   Wt 160 lb (72.6 kg)   SpO2 94%   BMI 25.06 kg/m   Physical Exam  Constitutional: He is oriented to person, place, and time. He appears well-nourished.  HENT:  Head: Normocephalic.  Dry mucous membranes  Eyes: Conjunctivae are normal.  Cardiovascular:  Tachycardia and no murmurs.  Pulmonary/Chest: He has no wheezes.  Increased work of breathing. Clear lungs.  Abdominal: Soft. He exhibits no distension. There is tenderness.  Urinary catheter in place.  Musculoskeletal: He exhibits no edema.  Neurological: He is oriented to person, place, and time.  Skin: Skin is warm and dry. He is not diaphoretic.  Psychiatric: He has a normal mood and affect. His behavior is normal.     ED Treatments / Results  Labs (all labs ordered are listed, but only abnormal results are displayed) Labs Reviewed  COMPREHENSIVE METABOLIC PANEL - Abnormal; Notable for the following:       Result Value   Sodium 134 (*)    Chloride 100 (*)    CO2 21 (*)    Glucose, Bld 149 (*)    BUN 28 (*)    Creatinine, Ser 1.78 (*)    AST 84 (*)    Alkaline Phosphatase 187 (*)    Total Bilirubin 2.3 (*)    GFR calc non Af Amer 33 (*)    GFR calc Af Amer 38 (*)    All other components within normal limits  CBC WITH DIFFERENTIAL/PLATELET - Abnormal; Notable for the following:    WBC 3.5 (*)    Platelets 55 (*)    Lymphs Abs 0.1 (*)    Monocytes Absolute 0.0 (*)    All other components within  normal limits  URINALYSIS, ROUTINE W REFLEX MICROSCOPIC (NOT AT [facility]) -  Abnormal; Notable for the following:    Color, Urine AMBER (*)    APPearance TURBID (*)    Hgb urine dipstick LARGE (*)    Bilirubin Urine SMALL (*)    Ketones, ur 15 (*)    Protein, ur 30 (*)    Leukocytes, UA LARGE (*)    All other components within normal limits  LACTIC ACID, PLASMA - Abnormal; Notable for the following:    Lactic Acid, Venous 3.3 (*)    All other components within normal limits  PROTIME-INR - Abnormal; Notable for the following:    Prothrombin Time 15.3 (*)    All other components within normal limits  FIBRINOGEN - Abnormal; Notable for the following:    Fibrinogen 605 (*)    All other components within normal limits  URINE MICROSCOPIC-ADD ON - Abnormal; Notable for the following:    Bacteria, UA MANY (*)    Crystals TRIPLE PHOSPHATE CRYSTALS (*)    All other components within normal limits  CBG MONITORING, ED - Abnormal; Notable for the following:    Glucose-Capillary 138 (*)    All other components within normal limits  I-STAT CG4 LACTIC ACID, ED - Abnormal; Notable for the following:    Lactic Acid, Venous 4.61 (*)    All other components within normal limits  I-STAT ARTERIAL BLOOD GAS, ED - Abnormal; Notable for the following:    pCO2 arterial 24.7 (*)    pO2, Arterial 50.0 (*)    Bicarbonate 16.0 (*)    Acid-base deficit 7.0 (*)    All other components within normal limits  CULTURE, BLOOD (ROUTINE X 2)  CULTURE, BLOOD (ROUTINE X 2)  URINE CULTURE  CULTURE, BLOOD (ROUTINE X 2)  CULTURE, BLOOD (ROUTINE X 2)  PROCALCITONIN  APTT  CORTISOL  BLOOD GAS, ARTERIAL  STREP PNEUMONIAE URINARY ANTIGEN  INFLUENZA PANEL BY PCR (TYPE A & B, H1N1)  MAGNESIUM  PHOSPHORUS  AMYLASE  LIPASE, BLOOD  TROPONIN I  TROPONIN I  TROPONIN I  LACTIC ACID, PLASMA  LACTIC ACID, PLASMA  BRAIN NATRIURETIC PEPTIDE  URINALYSIS, ROUTINE W REFLEX MICROSCOPIC (NOT AT [facility])  CORTISOL    EKG  EKG Interpretation  Date/Time:  Thursday January 14 2016 10:14:20  EDT Ventricular Rate:  102 PR Interval:    QRS Duration: 121 QT Interval:  342 QTC Calculation: 446 R Axis:   -50 Text Interpretation:  Sinus tachycardia Prolonged PR interval Left bundle branch block faster than previous. Confirmed by Gerald Leitz ([id number]) on 01/14/2016 10:17:43 AM       Radiology Dg Chest 2 View  Result Date: 01/13/2016 CLINICAL DATA:  She 82 year old male with fever and fatigue EXAM: CHEST  2 VIEW COMPARISON:  Chest radiograph dated 10/13/2015 and chest radiograph dated 12/04/2014 FINDINGS: There bibasilar platelike atelectasis/ scarring. Infiltrate is less likely but not excluded. There is no pleural effusion or pneumothorax. The cardiac silhouette is within normal limits. Median sternotomy wires and CABG vascular clips noted. There is osteopenia with degenerative changes of the spine. Mid thoracic compression deformities similar to prior radiograph. No acute fracture. IMPRESSION: Bibasilar platelike atelectasis/scarring. Infiltrate is less likely. Clinical correlation is recommended. Electronically Signed   By: Anner Crete M.D.   On: 01/13/2016 00:11   Dg Chest Port 1 View  Result Date: 01/14/2016 CLINICAL DATA:  Sepsis.  Hypoxia. EXAM: PORTABLE CHEST 1 VIEW COMPARISON:  01/12/2016. FINDINGS: Prior CABG. Borderline cardiomegaly. Mild pulmonary venous congestion  and interstitial prominence with left-sided pleural effusion. Findings suggest mild congestive heart failure. Left base atelectasis. No pneumothorax. IMPRESSION: 1. Prior CABG. Mild pulmonary venous congestion and interstitial prominence with small left pleural effusion consistent mild congestive heart failure. 2. Mild left base atelectasis. Electronically Signed   By: Marcello Moores  Register   On: 01/14/2016 10:51    Procedures Procedures (including critical care time)  Medications Ordered in ED Medications  vancomycin (VANCOCIN) IVPB 750 mg/150 ml premix (not administered)  piperacillin-tazobactam  (ZOSYN) IVPB 3.375 g (not administered)  0.9 %  sodium chloride infusion (not administered)  0.9 %  sodium chloride infusion ( Intravenous New Bag/Given 01/14/16 1456)  acetaminophen (TYLENOL) tablet 650 mg (not administered)  ondansetron (ZOFRAN) injection 4 mg (not administered)  albuterol (PROVENTIL) (2.5 MG/3ML) 0.083% nebulizer solution 2.5 mg (not administered)  norepinephrine (LEVOPHED) 4 mg in dextrose 5 % 250 mL (0.016 mg/mL) infusion (3 mcg/min Intravenous Not Given 01/14/16 1344)  sodium chloride 0.9 % bolus 1,000 mL (0 mLs Intravenous Stopped 01/14/16 1314)    And  sodium chloride 0.9 % bolus 1,000 mL (0 mLs Intravenous Stopped 01/14/16 1209)    And  sodium chloride 0.9 % bolus 250 mL (0 mLs Intravenous Stopped 01/14/16 1436)  piperacillin-tazobactam (ZOSYN) IVPB 3.375 g (0 g Intravenous Stopped 01/14/16 1209)  vancomycin (VANCOCIN) 1,500 mg in sodium chloride 0.9 % 500 mL IVPB (0 mg Intravenous Stopped 01/14/16 1404)  acetaminophen (TYLENOL) tablet 1,000 mg (1,000 mg Oral Given 01/14/16 1456)  ibuprofen (ADVIL,MOTRIN) tablet 600 mg (600 mg Oral Given 01/14/16 1143)  norepinephrine (LEVOPHED) 4 mg in dextrose 5 % 250 mL (0.016 mg/mL) infusion (6 mcg/min Intravenous Rate/Dose Change 01/14/16 1516)  sodium chloride 0.9 % bolus 750 mL (0 mLs Intravenous Stopped 01/14/16 1404)     Initial Impression / Assessment and Plan / ED Course  I have reviewed the triage vital signs and the nursing notes.  Pertinent labs & imaging results that were available during my care of the patient were reviewed by me and considered in my medical decision making (see chart for details).  Clinical Course    Patient is an 82 year old male with history of hypertension hyperlipidemia CAD, chronic urinary catheter presenting today with likely sepsis. Patient is febrile, tachycardic, recent diagnosis of a UTI. Suspect sepsis and called code sepsis. Weight-based dosing of fluids and antibiotics ordered. .    3:26 PM Elevated lactate. Xray shows congestion, but per sepsis protocol patient needs fluids, abx as early goal directed therapy.  Discussed code status with wife and son. Full code. Will admit to stepdown.   Patient BP dropped to 70V systolic. (dropped from 779 systolic in the last half hour)  Peripheral levo started and crit care called.  Lungs, with no crackles despite xray.  Urine from last visit never grew anything, unsure of source at this time, ? Kidney stone vs lung vs urine.   Pt still mentating, MAP in 57s.    CRITICAL CARE Performed by: Gardiner Sleeper Total critical care time: 60 minutes Critical care time was exclusive of separately billable procedures and treating other patients. Critical care was necessary to treat or prevent imminent or life-threatening deterioration. Critical care was time spent personally by me on the following activities: development of treatment plan with patient and/or surrogate as well as nursing, discussions with consultants, evaluation of patient's response to treatment, examination of patient, obtaining history from patient or surrogate, ordering and performing treatments and interventions, ordering and review of laboratory studies, ordering and  review of radiographic studies, pulse oximetry and re-evaluation of patient's condition.   Final Clinical Impressions(s) / ED Diagnoses   Final diagnoses:  Sepsis, due to unspecified organism [facility])    New Prescriptions New Prescriptions   No medications on file     Veleda Mun Julio Alm, MD 01/14/16 1526

## 2016-01-14 NOTE — ED Notes (Signed)
cbg was 138

## 2016-01-14 NOTE — H&P (Signed)
PULMONARY / CRITICAL CARE MEDICINE   Name: Ronald [name] MRN: [id number] DOB: 1927-06-21    ADMISSION DATE:  01/14/2016 CONSULTATION DATE:  01/14/16  REFERRING MD:  Dr. Thomasene Lot / EDP   CHIEF COMPLAINT:  AMS, abdominal pain  HISTORY OF PRESENT ILLNESS:   82 y/o M, former smoker, with PMH of arthritis, Bell's palsy, GERD, diverticulosis, CAD s/p CABG / stents (LVEF 45%), HTN, HLD, overactive bladder, prostate cancer, urethral stricture s/p chronic indwelling catheter with frequent UTI's and recent ER visit for suspected UTI (01/12/2016 - was treated with keflex, cultures grew multiple species) who presented to [facility] on 10/19 with altered mental status and abdominal pain.    Initial EMS evaluation notable for stable vital signs, vomiting and c/o's of abd pain.  CBG 190. Patient reported subjective fever and fatigue.  Initial ER evaluation found the patient to be normotensive, 91% on room air, tachycardia (115) and febrile to 101.8.  Labs - ABG 7.41 / 24 / 50 / 16, Na 134, K 4.3, sr cr 1.78 (up from 1.14), alk phos 187, albumin 3.6, t bili 2.3 (up from 1.4 on 10/17), lactic 4.61, WBC 3.5, Hgb 15.3, and platelets 55.  CXR review demonstrates low lung volumes, mild vascular congestion and small pleural effusions.  He developed hypotension in the ER. The patient was treated with empiric antibiotics and fluid resuscitation.    PCCM called for ICU admit.   PAST MEDICAL HISTORY :  He  has a past medical history of Arthritis; At risk for sleep apnea; Bladder calculi; CHF (congestive heart failure) ([place]); Chronic cystitis; Coronary artery disease (CARDIOLOGIST-  DR Angelena Form); Diverticulosis; GERD (gastroesophageal reflux disease); History of Bell's palsy; dizziness; Hyperlipidemia; Hypertension; Ischemic cardiomyopathy; Kidney stones ("years ago"); Melanoma of ear ([place]); Mild dementia; Myocardial infarction (1986; 1997); Nocturia; OAB (overactive bladder); Prostate cancer ([place]) (1998); S/P CABG (coronary  artery bypass graft); Skipped heart beats; Urethral stricture; UTI (urinary tract infection) (09/2015); Wears hearing aid; and Wears partial dentures.  PAST SURGICAL HISTORY: He  has a past surgical history that includes EUS (10/05/2011); Tonsillectomy and adenoidectomy (1954); Laparoscopic cholecystectomy (12-23-2005); RADIOACTIVE PROSTATE SEED IMPLANTS (1998); Cardiovascular stress test (08-01-2011  dr Angelena Form); Cataract extraction w/ intraocular lens  implant, bilateral (Bilateral, 2007); Cystoscopy with urethral dilatation (N/A, 12/25/2013); Melanoma excision (X 1); Cardiac catheterization (02-22-2005  dr Vidal Schwalbe); Cardiac catheterization (08-28-2013  DR Daneen Schick); Skin cancer excision (X 2); left heart catheterization with coronary/graft angiogram (N/A, 08/28/2013); Coronary artery bypass graft (11/ 1989  &  10/ 1997); Cystoscopy with urethral dilatation (N/A, 12/22/2014); and Insertion of suprapubic catheter (N/A, 12/22/2014).  Allergies  Allergen Reactions  . Pantoprazole Other (See Comments)    Headache and lightheaded  . Nitrofurantoin Hives  . Sulfa Antibiotics Hives and Itching    No current facility-administered medications on file prior to encounter.    Current Outpatient Prescriptions on File Prior to Encounter  Medication Sig  . acetaminophen (TYLENOL) 500 MG tablet Take 500 mg by mouth every 6 (six) hours as needed for pain or fever.   Marland Kitchen aspirin EC 81 MG EC tablet Take 1 tablet (81 mg total) by mouth daily.  Marland Kitchen atorvastatin (LIPITOR) 80 MG tablet Take 40 mg by mouth every evening.  . cephALEXin (KEFLEX) 500 MG capsule Take 1 capsule (500 mg total) by mouth 2 (two) times daily.  . Cholecalciferol (VITAMIN D) 2000 UNITS tablet Take 2,000 Units by mouth daily.  . flavoxATE (URISPAS) 100 MG tablet Take 100 mg by mouth 3 (three) times daily  as needed for bladder spasms.  Marland Kitchen galantamine (RAZADYNE ER) 8 MG 24 hr capsule Take 8 mg by mouth daily with breakfast.  . hydroxypropyl  methylcellulose (ISOPTO TEARS) 2.5 % ophthalmic solution Place 1 drop into both eyes 2 (two) times daily.   . isosorbide mononitrate (IMDUR) 60 MG 24 hr tablet Take 0.5 tablets (30 mg total) by mouth every morning.  . Multiple Vitamin (MULTIVITAMIN WITH MINERALS) TABS Take 1 tablet by mouth daily.  . nitroGLYCERIN (NITROSTAT) 0.4 MG SL tablet Place 0.4 mg under the tongue every 5 (five) minutes as needed for chest pain.     FAMILY HISTORY:  His indicated that his mother is deceased. He indicated that his father is deceased. He indicated that his sister is alive. He indicated that his brother is alive. He indicated that his maternal grandmother is deceased. He indicated that his maternal grandfather is deceased. He indicated that his paternal grandmother is deceased. He indicated that his paternal grandfather is deceased.    SOCIAL HISTORY: He  reports that he quit smoking about 32 years ago. His smoking use included Cigars. He quit after 40.00 years of use. He quit smokeless tobacco use about 2 years ago. His smokeless tobacco use included Chew. He reports that he does not drink alcohol or use drugs.  REVIEW OF SYSTEMS:  Patient is altered and having a difficult time with clear answers.  Information obtained from wife at bedside.   SUBJECTIVE: BP improving with NS resuscitation.   VITAL SIGNS: BP (!) 76/52   Pulse 105   Temp 101.8 F (38.8 C) (Oral)   Resp 25   Ht _0  (1.702 m)   Wt 160 lb (72.6 kg)   SpO2 92%   BMI 25.06 kg/m   HEMODYNAMICS:    VENTILATOR SETTINGS:    INTAKE / OUTPUT: No intake/output data recorded.  PHYSICAL EXAMINATION: General:  Chronically ill appearing elderly male in NAD  Neuro:  Awake, alert, confused, MAE  HEENT:  MM pink/dry, no jvd Cardiovascular:  s1s2 rrr, no m/r/g Lungs:  Even/non-labored, lungs bilaterally diminished lower, no wheeze  Abdomen:  Soft, tender to palpation, bsx4 active  Musculoskeletal:  No acute deformities  Skin:   Warm/dry, no edema    Sepsis - Repeat Assessment  Performed at:    1240  Vitals     Blood pressure (!) 76/58, pulse 119, temperature 101.8 F (38.8 C), temperature source Oral, resp. rate 26, height _1  (1.702 m), weight 160 lb (72.6 kg), SpO2 92 %.  Heart:     Regular rate and rhythm  Lungs:    CTA  Capillary Refill:   <2 sec  Peripheral Pulse:   Radial pulse palpable  Skin:     Normal Color and Dry   The recent clinical data is shown below. Vitals:   01/14/16 1200 01/14/16 1215 01/14/16 1230 01/14/16 1245  BP: 96/58 (!) 76/52 (!) 83/57 (!) 76/58  Pulse: 112 105 (!) 128 119  Resp: _2 Temp:      TempSrc:      SpO2: 92% 92% 93% 92%  Weight:      Height:         LABS:  BMET  Recent Labs Lab 01/12/16 2316 01/14/16 1028  NA 135 134*  K 3.9 4.3  CL 104 100*  CO2 23 21*  BUN 21* 28*  CREATININE 1.14 1.78*  GLUCOSE 138* 149*    Electrolytes  Recent Labs Lab 01/12/16 2316 01/14/16 1028  CALCIUM 9.3 9.7    CBC  Recent Labs Lab 01/12/16 2316 01/14/16 1028  WBC 13.2* 3.5*  HGB 14.2 15.3  HCT 43.0 45.0  PLT 123* 55*    Coag's No results for input(s): APTT, INR in the last 168 hours.  Sepsis Markers  Recent Labs Lab 01/12/16 2329 01/14/16 1045  LATICACIDVEN 1.18 4.61*    ABG  Recent Labs Lab 01/14/16 1219  PHART 7.417  PCO2ART 24.7*  PO2ART 50.0*    Liver Enzymes  Recent Labs Lab 01/12/16 2316 01/14/16 1028  AST 27 84*  ALT 17 53  ALKPHOS 66 187*  BILITOT 1.4* 2.3*  ALBUMIN 3.8 3.6    Cardiac Enzymes No results for input(s): TROPONINI, PROBNP in the last 168 hours.  Glucose  Recent Labs Lab 01/14/16 1018  GLUCAP 138*    Imaging Dg Chest Port 1 View  Result Date: 01/14/2016 CLINICAL DATA:  Sepsis.  Hypoxia. EXAM: PORTABLE CHEST 1 VIEW COMPARISON:  01/12/2016. FINDINGS: Prior CABG. Borderline cardiomegaly. Mild pulmonary venous congestion and interstitial prominence with left-sided pleural  effusion. Findings suggest mild congestive heart failure. Left base atelectasis. No pneumothorax. IMPRESSION: 1. Prior CABG. Mild pulmonary venous congestion and interstitial prominence with small left pleural effusion consistent mild congestive heart failure. 2. Mild left base atelectasis. Electronically Signed   By: Marcello Moores  Register   On: 01/14/2016 10:51     STUDIES:  Abd [place] 10/19 >>  ECHO 10/19 >>   CULTURES: BCx2 10/19 >>  UC 10/19 >>  Influenza 10/19 >>  U. Legionella 10/19 >>  U. Strep 10/19 >>   ANTIBIOTICS: Vanco 10/19 >>  Zosyn 10/19 >>   SIGNIFICANT EVENTS: 10/17  Seen in ER for UTI, treated with Keflex, UC with multiple species  10/19  Admit with hypotension, AMS, concern for infection/sepsis  LINES/TUBES: PIV 10/19 >>   DISCUSSION: 82 y/o M with PMH of suprapubic catheter admitted 10/19 with hypotension, AMS, abdominal pain.  Work up concerning for sepsis - concern for urinary source.    ASSESSMENT / PLAN:  PULMONARY A: Small Bilateral Pleural Effusions - noted on admit, hx of CHF Chronic Respiratory Alkalosis  Hypoxia on admit  Former Smoker P:   O2 to support sats > 90% Pulmonary hygiene - IS, mobilize  Intermittent CXR   CARDIOVASCULAR A:  Septic Shock - suspected hypovolemia + possible urinary source  Hx CAD, ICM (EF 35-40%), HTN (reportedly runs low, on imdur at baseline), HLD, CABG P:  ICU monitoring of hemodynamics Assess ECHO  Rule out MI, assess troponin Levophed as needed for MAP >65   Hold home Imdur, lipitor, ASA Assess BNP May need central access  Assess cortisol   RENAL A:   AKI - in setting of suspected septic shock  Hx Prostate CA, overactive bladder, urethral stricture, chronic cystitis & suprapubic catheter P:   Trend BMP / UOP  Replace electrolytes as indicated  Ensure adequate renal perfusion / avoid nephrotoxic agents   GASTROINTESTINAL A:   Abdominal Pain  Nausea / Vomiting Elevated T-Bili, Alk Phos Hx GERD,  Diverticulosis P:   Assess abdominal US NPO x meds / ice chips  May need abdominal imaging pending clinical course   HEMATOLOGIC A:   Anemia  Thrombocytopenia - noted on lab review since 09/2015 P:  Trend CBC  SCD's for DVT prophylaxis  Hold ASA  INFECTIOUS A:   Shock - rule out infectious etiology  P:   Cultures as above  Empiric vanco / zosyn as above  ENDOCRINE A:   No acute Issues  P:   Monitor glucose on BMP   NEUROLOGIC A:   Hx Mild Dementia  P:   RASS goal: n/a Monitor / supportive care  Promote sleep / wake cycle Hold home galantamine 10/19, restart in am 10/20  FAMILY  - Updates:  Family & patient updated at bedside.    - Inter-disciplinary family meet or Palliative Care meeting due by: Ongoing.  Approached discussion regarding ACLS / life support and family wanted to discuss further.  Full code for now.   Noe Gens, NP-C [facility] Pgr: [phone] or if no answer [phone] 01/14/2016, 12:30 PM  82 yo male with urostomy presented with vomiting, abdominal pain, fever.  Developed progressive hypotension with poor response to initial fluid bolus.  Had elevated lactic acid.  He has hx of systolic CHF.  Developed progressive confusion.  Seen in ER.  Follows simple commands, but has mild confusion.  HR regular.  Faint basilar crackles.  Abd soft, mild distention, decreased bowel sounds.  Urostomy site clean.  No edema.  No rashes.  Procalcitonin 3.12, Lactic acid 4.61 >> 3.3, WBC 3.5, PLT 55, Creatinine 1.78 CXR - mild congestion, low lung volumes, atx  Assessment/plan: Septic shock likely from recurrent UTI. - continue IV fluids - add pressors if fluid non responsive - continue Abx  Chronic systolic CHF. - f/u Echo  AKI >> baseline creatinine 1.14 from 01/12/16. Suprapubic catheter. - volume resuscitate - monitor renal fx, urine outpt  Thrombocytopenia 2nd to sepsis. - f/u CBC  Elevated LFTs. - f/u Abd u/s  Updated  family at bedside.  CC time by me independent of APP time 38 minutes.  Chesley Mires, MD [facility] 01/14/2016, 3:47 PM Pager:  [phone] After 3pm call: [phone]

## 2016-01-14 NOTE — ED Notes (Signed)
Spoke to admitting MD - verbal order to now start levaphed d/t bp still being low

## 2016-01-14 NOTE — Progress Notes (Signed)
Fuller Song, NP was notified of new onset afib since 35 lead this am. Will monitor closely.

## 2016-01-14 NOTE — Progress Notes (Signed)
Pharmacy Antibiotic Note  FREDERIC TONES is a 82 y.o. male admitted on 01/14/2016 with sepsis.  Pharmacy has been consulted for vancomycin and zosyn dosing. Tmax is 101.8 and WBC is low at 3.5. SCr is elevated at 1.78 and lactic acid is elevated at 4.61.   Plan: - Vancomycin 1500mg  IV x 1 then 750mg  IV Q24H - Zosyn 3.375gm IV Q8H (4 hr inf) - F/u renal fxn, C&S, clinical status and trough at SS  Height: 5\' 7"  (170.2 cm) Weight: 160 lb (72.6 kg) IBW/kg (Calculated) : 66.1  Temp (24hrs), Avg:101.8 F (38.8 C), Min:101.8 F (38.8 C), Max:101.8 F (38.8 C)   Recent Labs Lab 01/12/16 2316 01/12/16 2329 01/14/16 1028 01/14/16 1045  WBC 13.2*  --  3.5*  --   CREATININE 1.14  --  1.78*  --   LATICACIDVEN  --  1.18  --  4.61*    Estimated Creatinine Clearance: 27.9 mL/min (by C-G formula based on SCr of 1.78 mg/dL (H)).    Allergies  Allergen Reactions  . Pantoprazole Other (See Comments)    Headache and lightheaded  . Nitrofurantoin Hives  . Sulfa Antibiotics Hives and Itching    Antimicrobials this admission: Vanc 10/19>> Zosyn 10/19>>  Dose adjustments this admission: N/A  Microbiology results: Pending  Thank you for allowing pharmacy to be a part of this patient's care.  Cincere Zorn, Rande Lawman 01/14/2016 11:57 AM

## 2016-01-14 NOTE — Progress Notes (Signed)
Dr. Dallas Schimke notified of critical Troponin 4.33, Lactic acid 3.2 and decreased UO.  Stat EKG conducted and copy handed to MD.

## 2016-01-14 NOTE — ED Notes (Signed)
Attempted report x1. RN on floor aware and to callback.

## 2016-01-14 NOTE — ED Notes (Signed)
Verbal order to hold tylenol until 2pm today d/t patient's wife giving patient 650 mg tylenol this morning at 8am.

## 2016-01-14 NOTE — ED Notes (Signed)
Spoke to EDP regarding patient's low BP. MD aware of amount of fluid in - order to give levaphed

## 2016-01-14 NOTE — Progress Notes (Addendum)
Asked by ER to evaluate patient for admission. Initially patient's systolic blood pressure in the 140s and was receiving normal saline bolus to total 2250 mL for sepsis presumed related to complicated UTI although atypical pneumonia cannot be ruled out. Initial lactic acid was 4.61 with other sepsis criteria of white count 3500, acute kidney injury, altered mentation, tachypnea, and additional finding thrombocytopenia with continued abnormal urinalysis. Of note patient has chronic suprapubic catheter in place for 8 months and has had recurrent urinary tract infections typically pansensitive Escherichia coli, enterococcus or pseudomonas. Patient initially presented to the ER on 10/17 with fever fatigue and bladder spasms and was found to have abnormal urinalysis and was started on Keflex. Subsequent culture with multiple species. He returned today with continued fever and altered mentation and clinical findings as above. Since my acceptance of the patient his blood pressure has steadily decreased from 90 systolic (baseline between 90-100) now down in the 70s. I had previously initiated sepsis focus orders as well as other orders to rule out pneumonia such as urinary strep legionella and influenza PCR. History of CHF so ck ECHO esp with sepsis physiology. Full exam completed prior to change in hemodynamic status- pt awakens and complains of lower abdominal pain. Bedside RN notified EDP MacKuen who has since ordered a Levophed infusion and has contacted PCCM to evaluate the patient for ICU admission. In addition to above orders ABG and repeat lactic acid has been ordered/collected  Erin Hearing, ANP

## 2016-01-14 NOTE — ED Notes (Addendum)
Pharmacy called for levophed. BP low. MD aware.

## 2016-01-14 NOTE — Progress Notes (Signed)
Interim Progress Note:   Nursing noted of tropoinin of 4.33. Went to evaluate patient. He denies chest pain, sob, abdominal pain. STAT EKG without signs of ischemia. Spoke with Dr. Marlowe [facility]. Hold off on Heparin drip since no symptoms and also due to thrombocytopenia. Continue to cycle troponine. Cards will see patient, appreciate reccs.   Smiley Houseman, MD PGY 2 Family Medicine.

## 2016-01-14 NOTE — ED Notes (Signed)
Patient at u/s with RN

## 2016-01-14 NOTE — ED Notes (Signed)
Spoke to Critical Care MD to clarify. MD aware BP is still low with levophed. Verbal order to keep MAP >65 with levophed. BP just monitor.

## 2016-01-15 ENCOUNTER — Inpatient Hospital Stay (HOSPITAL_COMMUNITY): Payer: Medicare Other

## 2016-01-15 DIAGNOSIS — I509 Heart failure, unspecified: Secondary | ICD-10-CM

## 2016-01-15 DIAGNOSIS — I2510 Atherosclerotic heart disease of native coronary artery without angina pectoris: Secondary | ICD-10-CM

## 2016-01-15 DIAGNOSIS — R748 Abnormal levels of other serum enzymes: Secondary | ICD-10-CM

## 2016-01-15 LAB — BLOOD CULTURE ID PANEL (REFLEXED)
Acinetobacter baumannii: NOT DETECTED
Candida albicans: NOT DETECTED
Candida glabrata: NOT DETECTED
Candida krusei: NOT DETECTED
Candida parapsilosis: NOT DETECTED
Candida tropicalis: NOT DETECTED
Carbapenem resistance: NOT DETECTED
Enterobacter cloacae complex: NOT DETECTED
Enterobacteriaceae species: DETECTED — AB
Enterococcus species: NOT DETECTED
Escherichia coli: NOT DETECTED
Haemophilus influenzae: NOT DETECTED
Klebsiella oxytoca: NOT DETECTED
Klebsiella pneumoniae: NOT DETECTED
Listeria monocytogenes: NOT DETECTED
Neisseria meningitidis: NOT DETECTED
Proteus species: NOT DETECTED
Pseudomonas aeruginosa: DETECTED — AB
Serratia marcescens: NOT DETECTED
Staphylococcus aureus (BCID): NOT DETECTED
Staphylococcus species: NOT DETECTED
Streptococcus agalactiae: NOT DETECTED
Streptococcus pneumoniae: NOT DETECTED
Streptococcus pyogenes: NOT DETECTED
Streptococcus species: NOT DETECTED

## 2016-01-15 LAB — COMPREHENSIVE METABOLIC PANEL
ALT: 87 U/L — ABNORMAL HIGH (ref 17–63)
ALT: 85 U/L — ABNORMAL HIGH (ref 17–63)
AST: 138 U/L — ABNORMAL HIGH (ref 15–41)
AST: 122 U/L — ABNORMAL HIGH (ref 15–41)
Albumin: 2.6 g/dL — ABNORMAL LOW (ref 3.5–5.0)
Albumin: 2.4 g/dL — ABNORMAL LOW (ref 3.5–5.0)
Alkaline Phosphatase: 134 U/L — ABNORMAL HIGH (ref 38–126)
Alkaline Phosphatase: 137 U/L — ABNORMAL HIGH (ref 38–126)
Anion gap: 9 (ref 5–15)
Anion gap: 7 (ref 5–15)
BUN: 32 mg/dL — ABNORMAL HIGH (ref 6–20)
BUN: 34 mg/dL — ABNORMAL HIGH (ref 6–20)
CO2: 18 mmol/L — ABNORMAL LOW (ref 22–32)
CO2: 17 mmol/L — ABNORMAL LOW (ref 22–32)
Calcium: 8.4 mg/dL — ABNORMAL LOW (ref 8.9–10.3)
Calcium: 7.8 mg/dL — ABNORMAL LOW (ref 8.9–10.3)
Chloride: 110 mmol/L (ref 101–111)
Chloride: 113 mmol/L — ABNORMAL HIGH (ref 101–111)
Creatinine, Ser: 1.94 mg/dL — ABNORMAL HIGH (ref 0.61–1.24)
Creatinine, Ser: 1.75 mg/dL — ABNORMAL HIGH (ref 0.61–1.24)
GFR calc Af Amer: 34 mL/min — ABNORMAL LOW (ref >60)
GFR calc Af Amer: 39 mL/min — ABNORMAL LOW (ref >60)
GFR calc non Af Amer: 30 mL/min — ABNORMAL LOW (ref >60)
GFR calc non Af Amer: 34 mL/min — ABNORMAL LOW (ref >60)
Glucose, Bld: 176 mg/dL — ABNORMAL HIGH (ref 65–99)
Glucose, Bld: 160 mg/dL — ABNORMAL HIGH (ref 65–99)
Potassium: 3.9 mmol/L (ref 3.5–5.1)
Potassium: 4.3 mmol/L (ref 3.5–5.1)
Sodium: 137 mmol/L (ref 135–145)
Sodium: 137 mmol/L (ref 135–145)
Total Bilirubin: 2.3 mg/dL — ABNORMAL HIGH (ref 0.3–1.2)
Total Bilirubin: 1.7 mg/dL — ABNORMAL HIGH (ref 0.3–1.2)
Total Protein: 5.2 g/dL — ABNORMAL LOW (ref 6.5–8.1)
Total Protein: 5 g/dL — ABNORMAL LOW (ref 6.5–8.1)

## 2016-01-15 LAB — BASIC METABOLIC PANEL
Anion gap: 8 (ref 5–15)
BUN: 33 mg/dL — ABNORMAL HIGH (ref 6–20)
CO2: 18 mmol/L — ABNORMAL LOW (ref 22–32)
Calcium: 8.4 mg/dL — ABNORMAL LOW (ref 8.9–10.3)
Chloride: 109 mmol/L (ref 101–111)
Creatinine, Ser: 1.97 mg/dL — ABNORMAL HIGH (ref 0.61–1.24)
GFR calc Af Amer: 34 mL/min — ABNORMAL LOW (ref >60)
GFR calc non Af Amer: 29 mL/min — ABNORMAL LOW (ref >60)
Glucose, Bld: 159 mg/dL — ABNORMAL HIGH (ref 65–99)
Potassium: 4.5 mmol/L (ref 3.5–5.1)
Sodium: 135 mmol/L (ref 135–145)

## 2016-01-15 LAB — PROCALCITONIN: Procalcitonin: 79.67 ng/mL

## 2016-01-15 LAB — LACTIC ACID, PLASMA
Lactic Acid, Venous: 3.4 mmol/L (ref 0.5–1.9)
Lactic Acid, Venous: 3.2 mmol/L (ref 0.5–1.9)
Lactic Acid, Venous: 2.5 mmol/L (ref 0.5–1.9)

## 2016-01-15 LAB — ECHOCARDIOGRAM COMPLETE
Height: 65 in
Weight: 2659.63 oz

## 2016-01-15 LAB — CBC
HCT: 39.6 % (ref 39.0–52.0)
Hemoglobin: 13.2 g/dL (ref 13.0–17.0)
MCH: 30.6 pg (ref 26.0–34.0)
MCHC: 33.3 g/dL (ref 30.0–36.0)
MCV: 91.9 fL (ref 78.0–100.0)
Platelets: 50 10*3/uL — ABNORMAL LOW (ref 150–400)
RBC: 4.31 MIL/uL (ref 4.22–5.81)
RDW: 14.2 % (ref 11.5–15.5)
WBC: 26.4 10*3/uL — ABNORMAL HIGH (ref 4.0–10.5)

## 2016-01-15 LAB — TROPONIN I
Troponin I: 7.7 ng/mL (ref <0.03)
Troponin I: 9.27 ng/mL (ref <0.03)
Troponin I: 8.28 ng/mL (ref <0.03)
Troponin I: 5.04 ng/mL (ref <0.03)

## 2016-01-15 LAB — URINE CULTURE

## 2016-01-15 LAB — MAGNESIUM: Magnesium: 2.1 mg/dL (ref 1.7–2.4)

## 2016-01-15 MED ORDER — DEXTROSE 5 % IV SOLN
2.0000 g | INTRAVENOUS | Status: DC
  Administered 2016-01-15 – 2016-01-16 (×2): 2 g via INTRAVENOUS
  Filled 2016-01-15 (×2): qty 2

## 2016-01-15 MED ORDER — SODIUM CHLORIDE 0.9 % IV BOLUS (SEPSIS): 1000.0000 mL | Freq: Once | INTRAVENOUS | Status: DC

## 2016-01-15 MED ORDER — ENSURE ENLIVE PO LIQD
237.0000 mL | Freq: Two times a day (BID) | ORAL | Status: DC
  Administered 2016-01-15 – 2016-01-21 (×11): 237 mL via ORAL

## 2016-01-15 MED ORDER — SODIUM CHLORIDE 0.9 % IV SOLN: INTRAVENOUS | Status: DC

## 2016-01-15 MED ORDER — SODIUM CHLORIDE 0.9 % IV BOLUS (SEPSIS)
1000.0000 mL | Freq: Once | INTRAVENOUS | Status: AC
  Administered 2016-01-15: 1000 mL via INTRAVENOUS

## 2016-01-15 MED ORDER — PERFLUTREN LIPID MICROSPHERE
1.0000 mL | INTRAVENOUS | Status: DC | PRN
  Administered 2016-01-15: 2 mL via INTRAVENOUS
  Filled 2016-01-15: qty 10

## 2016-01-15 MED ORDER — PHENYLEPHRINE HCL 10 MG/ML IJ SOLN
30.0000 ug/min | INTRAVENOUS | Status: DC
  Administered 2016-01-15: 30 ug/min via INTRAVENOUS
  Filled 2016-01-15 (×2): qty 1

## 2016-01-15 MED ORDER — POLYVINYL ALCOHOL 1.4 % OP SOLN
1.0000 [drp] | OPHTHALMIC | Status: DC | PRN
  Administered 2016-01-15: 1 [drp] via OPHTHALMIC
  Filled 2016-01-15: qty 15

## 2016-01-15 MED ORDER — MAGNESIUM SULFATE IN D5W 1-5 GM/100ML-% IV SOLN
1.0000 g | Freq: Once | INTRAVENOUS | Status: AC
  Administered 2016-01-15: 1 g via INTRAVENOUS
  Filled 2016-01-15: qty 100

## 2016-01-15 MED ORDER — SODIUM CHLORIDE 0.9 % IV BOLUS (SEPSIS)
2000.0000 mL | Freq: Once | INTRAVENOUS | Status: AC
  Administered 2016-01-15: 2000 mL via INTRAVENOUS

## 2016-01-15 NOTE — Progress Notes (Signed)
Pt with continued minimal urine output, averaging 10 cc/hr.  Dr. Dallas Schimke notified, orders received to increase maintenance fluid to 100 cc/hr.

## 2016-01-15 NOTE — Progress Notes (Signed)
Pharmacy Antibiotic Note  Ronald [name] is a 82 y.o. male admitted on 01/14/2016 with sepsis.  Pharmacy has been consulted for vancomycin and zosyn dosing.   BCID is positive for Enterobacteriaceae species and Pseudomonas. Pharmacy is consulted to transition patient from zosyn to ceftazidime.  Plan: Ceftazidime 2g IV q24h Monitor culture data, renal function and clinical course  Height: 5\' 5"  (165.1 cm) Weight: 166 lb 3.6 oz (75.4 kg) IBW/kg (Calculated) : 61.5  Temp (24hrs), Avg:98.2 F (36.8 C), Min:96.2 F (35.7 C), Max:101.8 F (38.8 C)   Recent Labs Lab 01/12/16 2316  01/14/16 1028 01/14/16 1045 01/14/16 1216 01/14/16 1651 01/14/16 1849 01/15/16 0045 01/15/16 0543  WBC 13.2*  --  3.5*  --   --   --   --  26.4*  --   CREATININE 1.14  --  1.78*  --   --   --   --  1.94* 1.97*  LATICACIDVEN  --   < >  --  4.61* 3.3* 3.3* 3.2*  --  3.4*  < > = values in this interval not displayed.  Estimated Creatinine Clearance: 25.5 mL/min (by C-G formula based on SCr of 1.97 mg/dL (H)).    Allergies  Allergen Reactions  . Pantoprazole Other (See Comments)    Headache and lightheaded  . Nitrofurantoin Hives  . Sulfa Antibiotics Hives and Itching    Antimicrobials this admission: Vanc 10/19>>10/20 Zosyn 10/19>>10/20 Ceftazidime 10/20>>  Dose adjustments this admission: N/A  Microbiology results: 10/19 BCx: GNR 10/19 BCID: enterobacteriaceae species and Pseudomonas 10/19 UCx: mult species  10/19 MRSA PCR: neg  Andrey Cota. Diona Foley, PharmD, [place] Clinical Pharmacist Pager [phone] 01/15/2016 9:33 AM

## 2016-01-15 NOTE — Progress Notes (Signed)
Initial Nutrition Assessment  DOCUMENTATION CODES:   Not applicable  INTERVENTION:  Provide Ensure Enlive po BID, each supplement provides 350 kcal and 20 grams of protein.  Encourage adequate PO intake.   NUTRITION DIAGNOSIS:   Increased nutrient needs related to acute illness as evidenced by estimated needs.  GOAL:   Patient will meet greater than or equal to 90% of their needs  MONITOR:   PO intake, Supplement acceptance, Labs, Weight trends, Skin, I & O's  REASON FOR ASSESSMENT:   Malnutrition Screening Tool    ASSESSMENT:   82 year old with a past medical history of arthritis, Bell's palsy, GERD, diverticulosis, CAD s/p CABG x 2,(LVEF 45%), HTN, HLD, overactive bladder, prostate cancer, urethral stricture s/p chronic indwelling catheter with frequent UTI's and recent ER visit for suspected UTI (01/12/2016 - was treated with keflex, cultures grew multiple species) who presented to [facility] on 10/19 with altered mental status and abdominal pain, has elevated troponin at 9.27  Pt was unavailable during attempted time of visit. RD unable to obtain nutrition history. Per Epic weight records, weight has been stable. Diet has been advanced. No recent meal completion recorded. RD to order Ensure to aid in caloric and protein needs.  Unable to complete Nutrition-Focused physical exam at this time.   Labs and medications reviewed.   Diet Order:  Diet Heart Room service appropriate? Yes; Fluid consistency: Thin  Skin:  Reviewed, no issues  Last BM:  Unknown  Height:   Ht Readings from Last 1 Encounters:  01/14/16 5\' 5"  (1.651 m)    Weight:   Wt Readings from Last 1 Encounters:  01/15/16 166 lb 3.6 oz (75.4 kg)    Ideal Body Weight:  61.8 kg  BMI:  Body mass index is 27.66 kg/m.  Estimated Nutritional Needs:   Kcal:  1850-2000  Protein:  80-90 grams  Fluid:  1.8 - 2 L/day  EDUCATION NEEDS:   No education needs identified at this time  Corrin Parker, MS,  RD, LDN Pager # [phone] After hours/ weekend pager # [phone]

## 2016-01-15 NOTE — Progress Notes (Signed)
PHARMACY - PHYSICIAN COMMUNICATION CRITICAL VALUE ALERT - BLOOD CULTURE IDENTIFICATION (BCID)  Results for orders placed or performed during the hospital encounter of 01/14/16  Blood Culture ID Panel (Reflexed) (Collected: 01/14/2016 10:47 AM)  Result Value Ref Range   Enterococcus species NOT DETECTED NOT DETECTED   Listeria monocytogenes NOT DETECTED NOT DETECTED   Staphylococcus species NOT DETECTED NOT DETECTED   Staphylococcus aureus NOT DETECTED NOT DETECTED   Streptococcus species NOT DETECTED NOT DETECTED   Streptococcus agalactiae NOT DETECTED NOT DETECTED   Streptococcus pneumoniae NOT DETECTED NOT DETECTED   Streptococcus pyogenes NOT DETECTED NOT DETECTED   Acinetobacter baumannii NOT DETECTED NOT DETECTED   Enterobacteriaceae species DETECTED (A) NOT DETECTED   Enterobacter cloacae complex NOT DETECTED NOT DETECTED   Escherichia coli NOT DETECTED NOT DETECTED   Klebsiella oxytoca NOT DETECTED NOT DETECTED   Klebsiella pneumoniae NOT DETECTED NOT DETECTED   Proteus species NOT DETECTED NOT DETECTED   Serratia marcescens NOT DETECTED NOT DETECTED   Carbapenem resistance NOT DETECTED NOT DETECTED   Haemophilus influenzae NOT DETECTED NOT DETECTED   Neisseria meningitidis NOT DETECTED NOT DETECTED   Pseudomonas aeruginosa DETECTED (A) NOT DETECTED   Candida albicans NOT DETECTED NOT DETECTED   Candida glabrata NOT DETECTED NOT DETECTED   Candida krusei NOT DETECTED NOT DETECTED   Candida parapsilosis NOT DETECTED NOT DETECTED   Candida tropicalis NOT DETECTED NOT DETECTED    Name of physician (or Provider) Contacted: Dr. Benjamine Mola Deterding  Changes to prescribed antibiotics required: Discontinue Vancomycin.  Continue Zosyn.    [employer], Pharm.D., BCPS Clinical Pharmacist Pager [phone] 01/15/2016 1:49 AM

## 2016-01-15 NOTE — Progress Notes (Signed)
PULMONARY / CRITICAL CARE MEDICINE   Name: Ronald [name] MRN: [id number] DOB: 1927-06-21    ADMISSION DATE:  01/14/2016 CONSULTATION DATE:  01/14/16  REFERRING MD:  Dr. Thomasene Lot / EDP   CHIEF COMPLAINT:  AMS, abdominal pain  HISTORY OF PRESENT ILLNESS:   82 y/o M, former smoker, with PMH of arthritis, Bell's palsy, GERD, diverticulosis, CAD s/p CABG / stents (LVEF 45%), HTN, HLD, overactive bladder, prostate cancer, urethral stricture s/p chronic indwelling catheter with frequent UTI's and recent ER visit for suspected UTI (01/12/2016 - was treated with keflex, cultures grew multiple species) who presented to [facility] on 10/19 with altered mental status and abdominal pain.    Initial EMS evaluation notable for stable vital signs, vomiting and c/o's of abd pain.  CBG 190. Patient reported subjective fever and fatigue.  Initial ER evaluation found the patient to be normotensive, 91% on room air, tachycardia (115) and febrile to 101.8.  Labs - ABG 7.41 / 24 / 50 / 16, Na 134, K 4.3, sr cr 1.78 (up from 1.14), alk phos 187, albumin 3.6, t bili 2.3 (up from 1.4 on 10/17), lactic 4.61, WBC 3.5, Hgb 15.3, and platelets 55.  CXR review demonstrates low lung volumes, mild vascular congestion and small pleural effusions.  He developed hypotension in the ER. The patient was treated with empiric antibiotics and fluid resuscitation.    PCCM called for ICU admit.   PAST MEDICAL HISTORY :  He  has a past medical history of Arthritis; At risk for sleep apnea; Bladder calculi; CHF (congestive heart failure) ([place]); Chronic cystitis; Coronary artery disease (CARDIOLOGIST-  DR Angelena Form); Diverticulosis; GERD (gastroesophageal reflux disease); History of Bell's palsy; dizziness; Hyperlipidemia; Hypertension; Ischemic cardiomyopathy; Kidney stones ("years ago"); Melanoma of ear ([place]); Mild dementia; Myocardial infarction (1986; 1997); Nocturia; OAB (overactive bladder); Prostate cancer ([place]) (1998); S/P CABG (coronary  artery bypass graft); Skipped heart beats; Urethral stricture; UTI (urinary tract infection) (09/2015); Wears hearing aid; and Wears partial dentures.  PAST SURGICAL HISTORY: He  has a past surgical history that includes EUS (10/05/2011); Tonsillectomy and adenoidectomy (1954); Laparoscopic cholecystectomy (12-23-2005); RADIOACTIVE PROSTATE SEED IMPLANTS (1998); Cardiovascular stress test (08-01-2011  dr Angelena Form); Cataract extraction w/ intraocular lens  implant, bilateral (Bilateral, 2007); Cystoscopy with urethral dilatation (N/A, 12/25/2013); Melanoma excision (X 1); Cardiac catheterization (02-22-2005  dr Vidal Schwalbe); Cardiac catheterization (08-28-2013  DR Daneen Schick); Skin cancer excision (X 2); left heart catheterization with coronary/graft angiogram (N/A, 08/28/2013); Coronary artery bypass graft (11/ 1989  &  10/ 1997); Cystoscopy with urethral dilatation (N/A, 12/22/2014); and Insertion of suprapubic catheter (N/A, 12/22/2014).  Allergies  Allergen Reactions  . Pantoprazole Other (See Comments)    Headache and lightheaded  . Nitrofurantoin Hives  . Sulfa Antibiotics Hives and Itching    No current facility-administered medications on file prior to encounter.    Current Outpatient Prescriptions on File Prior to Encounter  Medication Sig  . acetaminophen (TYLENOL) 500 MG tablet Take 500 mg by mouth every 6 (six) hours as needed for pain or fever.   Marland Kitchen aspirin EC 81 MG EC tablet Take 1 tablet (81 mg total) by mouth daily.  Marland Kitchen atorvastatin (LIPITOR) 80 MG tablet Take 40 mg by mouth every evening.  . cephALEXin (KEFLEX) 500 MG capsule Take 1 capsule (500 mg total) by mouth 2 (two) times daily.  . Cholecalciferol (VITAMIN D) 2000 UNITS tablet Take 2,000 Units by mouth daily.  . flavoxATE (URISPAS) 100 MG tablet Take 100 mg by mouth 3 (three) times daily  as needed for bladder spasms.  Marland Kitchen galantamine (RAZADYNE ER) 8 MG 24 hr capsule Take 8 mg by mouth daily with breakfast.  . hydroxypropyl  methylcellulose (ISOPTO TEARS) 2.5 % ophthalmic solution Place 1 drop into both eyes 2 (two) times daily.   . isosorbide mononitrate (IMDUR) 60 MG 24 hr tablet Take 0.5 tablets (30 mg total) by mouth every morning.  . Multiple Vitamin (MULTIVITAMIN WITH MINERALS) TABS Take 1 tablet by mouth daily.  . nitroGLYCERIN (NITROSTAT) 0.4 MG SL tablet Place 0.4 mg under the tongue every 5 (five) minutes as needed for chest pain.     FAMILY HISTORY:  His indicated that his mother is deceased. He indicated that his father is deceased. He indicated that his sister is alive. He indicated that his brother is alive. He indicated that his maternal grandmother is deceased. He indicated that his maternal grandfather is deceased. He indicated that his paternal grandmother is deceased. He indicated that his paternal grandfather is deceased.    SOCIAL HISTORY: He  reports that he quit smoking about 32 years ago. His smoking use included Cigars. He quit after 40.00 years of use. He quit smokeless tobacco use about 2 years ago. His smokeless tobacco use included Chew. He reports that he does not drink alcohol or use drugs.  REVIEW OF SYSTEMS:  Patient now AOx3.   SUBJECTIVE: BP improving with NS resuscitation. Required starting levophed. Afebrile overnight.   VITAL SIGNS: BP 100/62   Pulse 67   Temp 97.5 F (36.4 C) (Oral)   Resp 16   Ht _0  (1.651 m)   Wt 166 lb 3.6 oz (75.4 kg)   SpO2 98%   BMI 27.66 kg/m   HEMODYNAMICS:    VENTILATOR SETTINGS:    INTAKE / OUTPUT: I/O last 3 completed shifts: In: 6807.1 [I.V.:1510.1; IV Piggyback:5297] Out: 21 [Urine:260]  PHYSICAL EXAMINATION: General:  Chronically ill appearing elderly male in NAD  Neuro:  AOx3, MAE  HEENT:  MM tacky, no jvd Cardiovascular:  s1s2 rrr, no m/r/g Lungs:  Even/non-labored, lungs bilaterally diminished lower, no wheeze  Abdomen:  Soft, tender to palpation, especially over suprapubic region, bsx4 active  Musculoskeletal:  No  acute deformities  Skin:  Warm/dry, no edema    Sepsis - Repeat Assessment  Performed at:    1240  Vitals     Blood pressure (!) 76/58, pulse 119, temperature 101.8 F (38.8 C), temperature source Oral, resp. rate 26, height _1  (1.702 m), weight 160 lb (72.6 kg), SpO2 92 %.  Heart:     Regular rate and rhythm  Lungs:    CTA  Capillary Refill:   <2 sec  Peripheral Pulse:   Radial pulse palpable  Skin:     Normal Color and Dry   The recent clinical data is shown below. Vitals:   01/15/16 0705 01/15/16 0715 01/15/16 0718 01/15/16 0759  BP: (!) 100/45 100/62    Pulse: (!) 29 (!) 30  67  Resp: _2 Temp:   97.5 F (36.4 C)   TempSrc:   Oral   SpO2: 99% 100%  98%  Weight:      Height:         LABS:  BMET  Recent Labs Lab 01/14/16 1028 01/15/16 0045 01/15/16 0543  NA 134* 137 135  K 4.3 3.9 4.5  CL 100* 110 109  CO2 21* 18* 18*  BUN 28* 32* 33*  CREATININE 1.78* 1.94* 1.97*  GLUCOSE 149* 176* 159*  Electrolytes  Recent Labs Lab 01/14/16 1028 01/14/16 1651 01/15/16 0045 01/15/16 0543  CALCIUM 9.7  --  8.4* 8.4*  MG  --  1.6*  --  2.1  PHOS  --  2.2*  --   --     CBC  Recent Labs Lab 01/12/16 2316 01/14/16 1028 01/15/16 0045  WBC 13.2* 3.5* 26.4*  HGB 14.2 15.3 13.2  HCT 43.0 45.0 39.6  PLT 123* 55* 50*    Coag's  Recent Labs Lab 01/14/16 1028  APTT 26  INR 1.21    Sepsis Markers  Recent Labs Lab 01/14/16 1028  01/14/16 1651 01/14/16 1849 01/15/16 0543  LATICACIDVEN  --   < > 3.3* 3.2* 3.4*  PROCALCITON 3.12  --   --   --   --   < > = values in this interval not displayed.  ABG  Recent Labs Lab 01/14/16 1219  PHART 7.417  PCO2ART 24.7*  PO2ART 50.0*    Liver Enzymes  Recent Labs Lab 01/12/16 2316 01/14/16 1028 01/15/16 0045  AST 27 84* 138*  ALT 17 53 87*  ALKPHOS 66 187* 134*  BILITOT 1.4* 2.3* 2.3*  ALBUMIN 3.8 3.6 2.6*    Cardiac Enzymes  Recent Labs Lab 01/14/16 1849 01/15/16 0045  01/15/16 0543  TROPONINI 4.33* 7.70* 9.27*    Glucose  Recent Labs Lab 01/14/16 1018 01/14/16 1634  GLUCAP 138* 159*    Imaging US Abdomen Port  Result Date: 01/14/2016 CLINICAL DATA:  Cholecystectomy.  Elevated liver function tests . EXAM: ABDOMEN ULTRASOUND COMPLETE COMPARISON:  CT 12/10/2013.  MRI 12/21/2012. FINDINGS: Gallbladder: Cholecystectomy. Common bile duct: Diameter: 5.3 mm Liver: Heterogeneous parenchymal pattern. Mild intrahepatic biliary ductal dilatation cannot be excluded. IVC: No abnormality visualized. Pancreas: Difficult to visualize. Pancreatic duct is again noted to be slightly prominent 3.9 mm . Spleen: Size and appearance within normal limits. Right Kidney: Length: 12.7 cm. Echogenicity within normal limits. No hydronephrosis visualized. 5.2 x 5.0 x 4.9 cm simple cyst right kidney. Left Kidney: Length: 12.8 cm. Echogenicity within normal limits. No mass or hydronephrosis visualized. Abdominal aorta: No aneurysm visualized. Other findings: None. IMPRESSION: 1. Cholecystectomy. 2. Heterogeneous hepatic parenchymal pattern. Mild intrahepatic biliary ductal dilatation cannot be excluded. 3. Pancreas is difficult to visualize. Pancreatic duct is again noted to be slightly prominent at 3.9 mm. If symptoms persist MRCP can be obtained for further evaluation. Electronically Signed   By: Marcello Moores  Register   On: 01/14/2016 16:03   Dg Chest Port 1 View  Result Date: 01/15/2016 CLINICAL DATA:  Respiratory failure. EXAM: PORTABLE CHEST 1 VIEW COMPARISON:  01/14/2016 . FINDINGS: Prior CABG. Cardiomegaly with mild pulmonary venous congestion and mild interstitial prominence. Slight improvement from prior exam. Persistent atelectasis left lung base. Persistent small left pleural effusion. IMPRESSION: 1. Prior CABG. Interim slight improvement of congestive heart failure and interstitial edema. Persistent small left pleural effusion. 2.  Persistent atelectasis left lung base.  Electronically Signed   By: Marcello Moores  Register   On: 01/15/2016 07:53   Dg Chest Port 1 View  Result Date: 01/14/2016 CLINICAL DATA:  Sepsis.  Hypoxia. EXAM: PORTABLE CHEST 1 VIEW COMPARISON:  01/12/2016. FINDINGS: Prior CABG. Borderline cardiomegaly. Mild pulmonary venous congestion and interstitial prominence with left-sided pleural effusion. Findings suggest mild congestive heart failure. Left base atelectasis. No pneumothorax. IMPRESSION: 1. Prior CABG. Mild pulmonary venous congestion and interstitial prominence with small left pleural effusion consistent mild congestive heart failure. 2. Mild left base atelectasis. Electronically Signed   By: Marcello Moores  Register   On: 01/14/2016 10:51     STUDIES:  Abd [place] 10/19 >> Pancreatic duct slightly prominent ECHO 10/19 >>   CULTURES: BCx2 10/19 >> Enterococcus and Pseudomonas detected UC 10/19 >>  Influenza ? U. Legionella ? U. Strep 10/19 ?  ANTIBIOTICS: Vanco 10/19 >>  Zosyn 10/19 >>   SIGNIFICANT EVENTS: 10/17  Seen in ER for UTI, treated with Keflex, UC with multiple species  10/19  Admit with hypotension, AMS, concern for infection/sepsis; started on levophed  LINES/TUBES: PIV x2 10/19 >>   DISCUSSION: 82 y/o M with PMH of suprapubic catheter admitted 10/19 with hypotension, AMS, abdominal pain.  Work up concerning for sepsis - concern for urinary source.  WBC increasing.   ASSESSMENT / PLAN:  PULMONARY A: Small Bilateral Pleural Effusions - noted on admit, hx of CHF (EF 45-50%) Chronic Respiratory Alkalosis  Hypoxia on admit  Former Smoker Improved pulmonary edema on CXR 10/20 P:   O2 to support sats > 90% Pulmonary hygiene - IS, mobilize  Intermittent CXR   CARDIOVASCULAR A:  Septic Shock - suspected hypovolemia + possible urinary source  Hx CAD, ICM (EF 35-40%), HTN (reportedly runs low, on imdur at baseline), HLD, CABG Elevated troponin, suspect NSTEMI - 4 > 4.33 > 7.7 > 9.27 BNP 719.7 P:  ICU monitoring of  hemodynamics Assess ECHO  Cardiology consulted, appreciate recommendations Levophed as needed for MAP >65   Hold home Imdur, lipitor, ASA May need central access   RENAL A:   AKI - in setting of suspected septic shock  Hx Prostate CA, overactive bladder, urethral stricture, chronic cystitis & suprapubic catheter P:   Trend BMP / UOP  Replace electrolytes as indicated  Ensure adequate renal perfusion / avoid nephrotoxic agents   GASTROINTESTINAL A:   Abdominal Pain  Nausea / Vomiting Elevated T-Bili, Alk Phos; AST/ALT Hx GERD, Diverticulosis Pancreatic duct prominence on [place], s/p cholecystectomy Increasing LFTs P:   NPO x meds / ice chips  May need abdominal imaging pending clinical course   HEMATOLOGIC A:   Anemia  Thrombocytopenia - noted on lab review since 09/2015; Worsening 123 > 50 P:  Trend CBC  SCD's for DVT prophylaxis  Hold ASA  INFECTIOUS A:   Shock - rule out infectious etiology  Pct 3.12 P:   Cultures as above  Empiric vanco / zosyn as above  Trend procalcitonin   ENDOCRINE A:   No acute Issues  Cortisol appropriately elevated at > 100 P:   Monitor glucose q4h   NEUROLOGIC A:   Hx Mild Dementia  P:   RASS goal: n/a Monitor / supportive care  Promote sleep / wake cycle Hold home galantamine 10/19, restart in am 10/20  FAMILY  - Updates:  Family & patient updated at bedside 10/19.    - Inter-disciplinary family meet or Palliative Care meeting due by: Ongoing.  Approached discussion regarding ACLS / life support and family wanted to discuss further.  Full code for now.   Olene Floss, MD [place], PGY-2

## 2016-01-15 NOTE — Progress Notes (Signed)
  Echocardiogram 2D Echocardiogram with definity has been performed.  Ronald [name] M 01/15/2016, 9:23 AM

## 2016-01-15 NOTE — Progress Notes (Signed)
Bladder scanned patient, results showed 390mL residual.  Dr. Chase Caller notified.

## 2016-01-15 NOTE — Progress Notes (Signed)
CRITICAL VALUE ALERT  Critical value received:  Lactic acid 3.4  Date of notification:  01/15/16  Time of notification:  0724  Critical value read back:Yes.    Nurse who received alert:  Polly Cobia, RN  MD notified (1st page):  Dr. Dallas Schimke  Time of first page:  0724  MD notified (2nd page):  Time of second page:  Responding MD:  Dr. Dallas Schimke  Time MD responded:  [phone]

## 2016-01-15 NOTE — Consult Note (Signed)
Cardiology Consult    Patient ID: Ronald [name] MRN: [id number], DOB/AGE: 1927-08-09   Admit date: 01/14/2016 Date of Consult: 01/15/2016  Primary Physician: Nyoka Cowden, MD Reason for Consult: Elevated troponin Primary Cardiologist: Dr. Angelena Form Requesting Provider: Dr. Marily Memos  Patient Profile    Ronald [name] is a 82 year old with a past medical history of arthritis, Bell's palsy, GERD, diverticulosis, CAD s/p CABG x 2,(LVEF 45%), HTN, HLD, overactive bladder, prostate cancer, urethral stricture s/p chronic indwelling catheter with frequent UTI's and recent ER visit for suspected UTI (01/12/2016 - was treated with keflex, cultures grew multiple species) who presented to [facility] on 10/19 with altered mental status and abdominal pain, has elevated troponin at 9.27  History of Present Illness    Ronald [name] was admitted for altered mental status and abdominal pain. He was seen in the ED 3 days ago on 01/12/16 for suspected UTI and was treated with Keflex. He has an indwelling urinary catheter that was recently changed out. He was admitted for sepsis, lactic acid 4.61.   Troponin was elevated at 4.06>>4.33>>7.70>>9.27. He denies chest pain, however does have a history of dementia. His initial EKG showed sinus tach with t wave inversion and depression in the anterolateral leads that was not previously noted on a prior EKG, this morning's EKG shows NSR with frequent PAC's.   He is chest pain free at the time of my encounter. He denies SOB. He does feel weak and lethargic.   His last heart cath was in 2015, at that time he had a patent SVG to obtuse marginal, occluded SVG to RCA, occluded SVG to ramus, and patent LIMA to LAD/diagonal. At that time his anatomy had not changed much from his last cath in 2006.   Past Medical History   Past Medical History:  Diagnosis Date  . Arthritis    "joints ache" (01/02/2014)  . At risk for sleep apnea    STOP-BANG= 4    SENT TO PCP 12-23-2013   . Bladder calculi   . CHF (congestive heart failure) ([place])   . Chronic cystitis   . Coronary artery disease CARDIOLOGIST-  DR Grace Bushy  [place] -- S/P  11/89 CABG x6 (70% circ; 90% PD; 60-70% distal left main; 30% left circ; 90% 1st diag;, 2nd diag and 3rd diag. w/90%,  mild stenosis LAD and 60-70% pLAD)  Re-do CABG x5 in 1997  . Diverticulosis   . GERD (gastroesophageal reflux disease)   . History of Bell's palsy    RIGHT SIDE-- NO RESIDUAL  . Hx of dizziness   . Hyperlipidemia   . Hypertension    "not anymore" (01/02/2014)  . Ischemic cardiomyopathy    ef 35-40% per cath 08-28-2013  . Kidney stones "years ago"   "passed them"  . Melanoma of ear ([place])    "right"  . Mild dementia   . Myocardial infarction 1986; 1997  . Nocturia   . OAB (overactive bladder)   . Prostate cancer ([place]) 1998   S/P  [place]; [place]  . S/P CABG (coronary artery bypass graft)    [place]  . Skipped heart beats    occasional  . Urethral stricture   . UTI (urinary tract infection) 09/2015  . Wears hearing aid    bilateral  . Wears partial dentures     Past Surgical History:  Procedure Laterality Date  . CARDIAC CATHETERIZATION  02-22-2005  dr Vidal Schwalbe   mild to moderate lv dysfunction with inferobasilar akinesis/  totally occluded SVG to Intermediate Diagonal and SVG to PDA and RCA branches, totally occluded native coronary circulation with diffuse disease pLAD diagonal system with potentially could be ischemic/  patent SVG to OM with collaterals to dRCA and patent LIMA to LAD and diagonal systemss  . CARDIAC CATHETERIZATION  08-28-2013  DR Daneen Schick   widely patent sequential left internal graft to the diagonal/LAD, widely patent SVG to OM with proximal 50% narrowing noted in the graft, total occlusion SVG's to RCA, RI, and the Diagonal/ LV dysfunction with inferobasal aneurysm and mid anterior wall region of akinesis/  overall EF 35-40%/  Total occlusion of  the navtive circulation/  no significant change compared to 2006 cath  . CARDIOVASCULAR STRESS TEST  08-01-2011  dr Angelena Form   inferior scar and possible soft tissue attenuation with minimal peri-infarct ischemia, small region of anterior ischemia and scar/  LVEF 53% LV wall motion with inferior hypokinesis/ no significant change from scan july 2011  . CATARACT EXTRACTION W/ INTRAOCULAR LENS  IMPLANT, BILATERAL Bilateral 2007  . CORONARY ARTERY BYPASS GRAFT  11/ [place]-- 6 vessel/  1997 Re-do 5 vessel  . CYSTOSCOPY WITH URETHRAL DILATATION N/A 12/25/2013   Procedure: CYSTOSCOPY WITH URETHRAL DILATATION, WITH BIOPSY;  Surgeon: Bernestine Amass, MD;  Location[facility];  Service: Urology;  Laterality: N/A;  . CYSTOSCOPY WITH URETHRAL DILATATION N/A 12/22/2014   Procedure: CYSTOSCOPY WITH URETHRAL DILATATION;  Surgeon: Rana Snare, MD;  Location: [facility];  Service: Urology;  Laterality: N/A;  BALLOON DILATION CATHETER    . EUS  10/05/2011   Procedure: ESOPHAGEAL ENDOSCOPIC ULTRASOUND (EUS) RADIAL;  Surgeon: Arta Silence, MD;  Location: [facility];  Service: Endoscopy;  Laterality: N/A;  . INSERTION OF SUPRAPUBIC CATHETER N/A 12/22/2014   Procedure: INSERTION OF SUPRAPUBIC CATHETER ;  Surgeon: Rana Snare, MD;  Location: [facility];  Service: Urology;  Laterality: N/A;  . LAPAROSCOPIC CHOLECYSTECTOMY  12-23-2005  . LEFT HEART CATHETERIZATION WITH CORONARY/GRAFT ANGIOGRAM N/A 08/28/2013   Procedure: LEFT HEART CATHETERIZATION WITH Beatrix Fetters;  Surgeon: Sinclair Grooms, MD;  [facility];  Service: Cardiovascular;  Laterality: N/A;  . MELANOMA EXCISION  X 1   "ear"  . RADIOACTIVE PROSTATE SEED IMPLANTS  1998  . SKIN CANCER EXCISION  X 2   "top of head"  . TONSILLECTOMY AND ADENOIDECTOMY  1954     Allergies  Allergies  Allergen Reactions  . Pantoprazole Other (See Comments)    Headache and lightheaded  . Nitrofurantoin Hives  . Sulfa  Antibiotics Hives and Itching    Inpatient Medications    . mouth rinse  15 mL Mouth Rinse BID  . piperacillin-tazobactam (ZOSYN)  IV  3.375 g Intravenous Q8H    Family History    Family History  Problem Relation Age of Onset  . Heart disease Mother   . Diabetes Father   . Heart disease Brother     Social History    Social History   Social History  . Marital status: Married    Spouse name: N/A  . Number of children: N/A  . Years of education: N/A   Occupational History  . Not on file.   Social History Main Topics  . Smoking status: Former Smoker    Years: 40.00    Types: Cigars    Quit date: 12/24/1983  . Smokeless tobacco:  Former User    Types: Chew    Quit date: 08/28/2013  . Alcohol use No  . Drug use: No  . Sexual activity: Not on file   Other Topics Concern  . Not on file   Social History Narrative  . No narrative on file     Review of Systems    General:  +chills, +fever, night sweats or weight changes.  Cardiovascular:  No chest pain, dyspnea on exertion, edema, orthopnea, palpitations, paroxysmal nocturnal dyspnea. Dermatological: No rash, lesions/masses Respiratory: No cough, dyspnea Urologic: No hematuria, dysuria Abdominal:   No nausea, vomiting, diarrhea, bright red blood per rectum, melena, or hematemesis Neurologic:  No visual changes, wkns, changes in mental status. All other systems reviewed and are otherwise negative except as noted above.  Physical Exam    Blood pressure (!) 99/54, pulse 63, temperature 97.5 F (36.4 C), temperature source Oral, resp. rate 17, height 5\' 5"  (1.651 m), weight 166 lb 3.6 oz (75.4 kg), SpO2 96 %.  General: Pleasant, NAD Psych: Normal affect. Neuro: Confused but appropriate. Moves all extremities spontaneously. HEENT: Normal  Neck: Supple without bruits or JVD. Lungs:  Resp regular and unlabored, CTA. Heart: RRR no s3, s4, or murmurs. Abdomen: Soft, non-tender, non-distended, BS + x 4.  Extremities:  No clubbing, cyanosis or edema. DP/PT/Radials 2+ and equal bilaterally.  Labs     Recent Labs  01/14/16 1651 01/14/16 1849 01/15/16 0045 01/15/16 0543  TROPONINI 4.06* 4.33* 7.70* 9.27*   Lab Results  Component Value Date   WBC 26.4 (H) 01/15/2016   HGB 13.2 01/15/2016   HCT 39.6 01/15/2016   MCV 91.9 01/15/2016   PLT 50 (L) 01/15/2016    Recent Labs Lab 01/15/16 0045 01/15/16 0543  NA 137 135  K 3.9 4.5  CL 110 109  CO2 18* 18*  BUN 32* 33*  CREATININE 1.94* 1.97*  CALCIUM 8.4* 8.4*  PROT 5.2*  --   BILITOT 2.3*  --   ALKPHOS 134*  --   ALT 87*  --   AST 138*  --   GLUCOSE 176* 159*   Lab Results  Component Value Date   CHOL 133 07/23/2015   HDL 52 07/23/2015   LDLCALC 71 07/23/2015   TRIG 49 07/23/2015   No results found for: [facility]   Radiology Studies    Dg Chest 2 View  Result Date: 01/13/2016 CLINICAL DATA:  She 82 year old male with fever and fatigue EXAM: CHEST  2 VIEW COMPARISON:  Chest radiograph dated 10/13/2015 and chest radiograph dated 12/04/2014 FINDINGS: There bibasilar platelike atelectasis/ scarring. Infiltrate is less likely but not excluded. There is no pleural effusion or pneumothorax. The cardiac silhouette is within normal limits. Median sternotomy wires and CABG vascular clips noted. There is osteopenia with degenerative changes of the spine. Mid thoracic compression deformities similar to prior radiograph. No acute fracture. IMPRESSION: Bibasilar platelike atelectasis/scarring. Infiltrate is less likely. Clinical correlation is recommended. Electronically Signed   By: Anner Crete M.D.   On: 01/13/2016 00:11   US Abdomen Port  Result Date: 01/14/2016 CLINICAL DATA:  Cholecystectomy.  Elevated liver function tests . EXAM: ABDOMEN ULTRASOUND COMPLETE COMPARISON:  CT 12/10/2013.  MRI 12/21/2012. FINDINGS: Gallbladder: Cholecystectomy. Common bile duct: Diameter: 5.3 mm Liver: Heterogeneous parenchymal pattern. Mild intrahepatic  biliary ductal dilatation cannot be excluded. IVC: No abnormality visualized. Pancreas: Difficult to visualize. Pancreatic duct is again noted to be slightly prominent 3.9 mm . Spleen: Size and appearance within normal limits. Right Kidney: Length:  12.7 cm. Echogenicity within normal limits. No hydronephrosis visualized. 5.2 x 5.0 x 4.9 cm simple cyst right kidney. Left Kidney: Length: 12.8 cm. Echogenicity within normal limits. No mass or hydronephrosis visualized. Abdominal aorta: No aneurysm visualized. Other findings: None. IMPRESSION: 1. Cholecystectomy. 2. Heterogeneous hepatic parenchymal pattern. Mild intrahepatic biliary ductal dilatation cannot be excluded. 3. Pancreas is difficult to visualize. Pancreatic duct is again noted to be slightly prominent at 3.9 mm. If symptoms persist MRCP can be obtained for further evaluation. Electronically Signed   By: Marcello Moores  Register   On: 01/14/2016 16:03   Dg Chest Port 1 View  Result Date: 01/15/2016 CLINICAL DATA:  Respiratory failure. EXAM: PORTABLE CHEST 1 VIEW COMPARISON:  01/14/2016 . FINDINGS: Prior CABG. Cardiomegaly with mild pulmonary venous congestion and mild interstitial prominence. Slight improvement from prior exam. Persistent atelectasis left lung base. Persistent small left pleural effusion. IMPRESSION: 1. Prior CABG. Interim slight improvement of congestive heart failure and interstitial edema. Persistent small left pleural effusion. 2.  Persistent atelectasis left lung base. Electronically Signed   By: Marcello Moores  Register   On: 01/15/2016 07:53   Dg Chest Port 1 View  Result Date: 01/14/2016 CLINICAL DATA:  Sepsis.  Hypoxia. EXAM: PORTABLE CHEST 1 VIEW COMPARISON:  01/12/2016. FINDINGS: Prior CABG. Borderline cardiomegaly. Mild pulmonary venous congestion and interstitial prominence with left-sided pleural effusion. Findings suggest mild congestive heart failure. Left base atelectasis. No pneumothorax. IMPRESSION: 1. Prior CABG. Mild pulmonary  venous congestion and interstitial prominence with small left pleural effusion consistent mild congestive heart failure. 2. Mild left base atelectasis. Electronically Signed   By: Marcello Moores  Register   On: 01/14/2016 10:51    EKG & Cardiac Imaging    EKG: NSR  Echocardiogram: pending   Assessment & Plan    1. Elevated troponin/Demand ischemia: In the setting of urosepsis. This is likely demand ischemia with severe underlying CAD but cannot fully exclude myocardial ischemia. Fortunately he is chest pain free and appears comfortable. His EKG initially is worrisome for ischemia in the anterolateral leads, but he is thrombocytopenic and in acute renal failure, so he is not a cath candidate at this time.   No heparin in the setting of thrombocytopenia. Would recommend support care. We will follow.   2. Urosepsis: Management per primary team.   3. History of CAD s/p CABGx 2.     Signed, Arbutus Leas, NP 01/15/2016, 8:44 AM Pager: [phone]  I have personally seen and examined this patient with Jettie Booze, NP. I agree with the assessment and plan as outlined above. I know Ronald [name] well having followed him in the outpatient setting. He has severe underlying CAD with 2/4 patent bypass grafts by cath in 2015. He has known ischemic cardiomyopathy with LVEF around 45% by echo in 2015 and 40% by LV gram in 2015. He is now admitted with hypotension and likely sepsis. He is on Levophed. Troponin is up to 9. This is higher than would be expected from pure demand ischemia but given the severity of his underlying CAD and hypotension along with use of pressor agents, it is to be expected that there would be some myocardial ischemia. He is having no chest pain or dyspnea. EKG with subtle ST changes.  -For now, I would continue aggressive supportive care and treatment for urosepsis. I do not think a cardiac cath is indicated. Cardiac cath would be contra-indicated at this time as well given his renal failure  and thrombocytopenia. We will follow along. I have discussed  this with the patient, his son and the PCCM rounding team.   Lauree Chandler 01/15/2016 10:41 AM

## 2016-01-15 NOTE — Progress Notes (Signed)
Left message with triage nurse at [facility] for Dr. Risa Grill to notify him of patient's admission and current plan of care. Suprapubic cath to be replaced by 4W team due to leaking and retention. Treating Enterobacter and Pseudomonas urosepsis with fortaz.   Olene Floss, MD [place], PGY-2

## 2016-01-16 DIAGNOSIS — I10 Essential (primary) hypertension: Secondary | ICD-10-CM

## 2016-01-16 DIAGNOSIS — I5022 Chronic systolic (congestive) heart failure: Secondary | ICD-10-CM

## 2016-01-16 DIAGNOSIS — I214 Non-ST elevation (NSTEMI) myocardial infarction: Secondary | ICD-10-CM | POA: Diagnosis present

## 2016-01-16 LAB — COMPREHENSIVE METABOLIC PANEL
ALT: 75 U/L — ABNORMAL HIGH (ref 17–63)
AST: 92 U/L — ABNORMAL HIGH (ref 15–41)
Albumin: 2.3 g/dL — ABNORMAL LOW (ref 3.5–5.0)
Alkaline Phosphatase: 180 U/L — ABNORMAL HIGH (ref 38–126)
Anion gap: 5 (ref 5–15)
BUN: 37 mg/dL — ABNORMAL HIGH (ref 6–20)
CO2: 19 mmol/L — ABNORMAL LOW (ref 22–32)
Calcium: 8 mg/dL — ABNORMAL LOW (ref 8.9–10.3)
Chloride: 113 mmol/L — ABNORMAL HIGH (ref 101–111)
Creatinine, Ser: 1.42 mg/dL — ABNORMAL HIGH (ref 0.61–1.24)
GFR calc Af Amer: 50 mL/min — ABNORMAL LOW (ref >60)
GFR calc non Af Amer: 43 mL/min — ABNORMAL LOW (ref >60)
Glucose, Bld: 123 mg/dL — ABNORMAL HIGH (ref 65–99)
Potassium: 3.9 mmol/L (ref 3.5–5.1)
Sodium: 137 mmol/L (ref 135–145)
Total Bilirubin: 1.3 mg/dL — ABNORMAL HIGH (ref 0.3–1.2)
Total Protein: 5 g/dL — ABNORMAL LOW (ref 6.5–8.1)

## 2016-01-16 LAB — CBC
HCT: 38.6 % — ABNORMAL LOW (ref 39.0–52.0)
Hemoglobin: 12.9 g/dL — ABNORMAL LOW (ref 13.0–17.0)
MCH: 30.2 pg (ref 26.0–34.0)
MCHC: 33.4 g/dL (ref 30.0–36.0)
MCV: 90.4 fL (ref 78.0–100.0)
Platelets: 53 10*3/uL — ABNORMAL LOW (ref 150–400)
RBC: 4.27 MIL/uL (ref 4.22–5.81)
RDW: 14.5 % (ref 11.5–15.5)
WBC: 21.4 10*3/uL — ABNORMAL HIGH (ref 4.0–10.5)

## 2016-01-16 LAB — PHOSPHORUS: Phosphorus: 2.4 mg/dL — ABNORMAL LOW (ref 2.5–4.6)

## 2016-01-16 LAB — PROCALCITONIN: Procalcitonin: 39.79 ng/mL

## 2016-01-16 LAB — MAGNESIUM: Magnesium: 2 mg/dL (ref 1.7–2.4)

## 2016-01-16 MED ORDER — ASPIRIN EC 81 MG PO TBEC
81.0000 mg | DELAYED_RELEASE_TABLET | Freq: Every day | ORAL | Status: DC
  Administered 2016-01-16 – 2016-01-21 (×6): 81 mg via ORAL
  Filled 2016-01-16 (×6): qty 1

## 2016-01-16 MED ORDER — POTASSIUM PHOSPHATES 15 MMOLE/5ML IV SOLN
10.0000 mmol | Freq: Once | INTRAVENOUS | Status: AC
  Administered 2016-01-16: 10 mmol via INTRAVENOUS
  Filled 2016-01-16: qty 3.33

## 2016-01-16 MED ORDER — ATORVASTATIN CALCIUM 80 MG PO TABS
80.0000 mg | ORAL_TABLET | Freq: Every evening | ORAL | Status: DC
  Administered 2016-01-16 – 2016-01-20 (×5): 80 mg via ORAL
  Filled 2016-01-16 (×6): qty 1

## 2016-01-16 MED ORDER — CEFTAZIDIME 2 G IJ SOLR
2.0000 g | Freq: Two times a day (BID) | INTRAMUSCULAR | Status: DC
  Administered 2016-01-16 – 2016-01-17 (×2): 2 g via INTRAVENOUS
  Filled 2016-01-16 (×4): qty 2

## 2016-01-16 MED ORDER — DOCUSATE SODIUM 100 MG PO CAPS
100.0000 mg | ORAL_CAPSULE | Freq: Two times a day (BID) | ORAL | Status: DC | PRN
  Filled 2016-01-16 (×3): qty 1

## 2016-01-16 NOTE — Progress Notes (Signed)
PULMONARY / CRITICAL CARE MEDICINE   Name: Ronald [name] MRN: [id number] DOB: 1927/09/25    ADMISSION DATE:  01/14/2016 CONSULTATION DATE:  01/14/16  REFERRING MD:  Dr. Thomasene Lot / EDP   CHIEF COMPLAINT:  AMS, abdominal pain  brief 82 y/o M, former smoker, with PMH of arthritis, Bell's palsy, GERD, diverticulosis, CAD s/p CABG / stents (LVEF 45%), HTN, HLD, overactive bladder, prostate cancer, urethral stricture s/p chronic indwelling catheter with frequent UTI's and recent ER visit for suspected UTI (01/12/2016 - was treated with keflex, cultures grew multiple species) who presented to [facility] on 10/19 with altered mental status and abdominal pain.    Initial EMS evaluation notable for stable vital signs, vomiting and c/o's of abd pain.  CBG 190. Patient reported subjective fever and fatigue.  Initial ER evaluation found the patient to be normotensive, 91% on room air, tachycardia (115) and febrile to 101.8.  Labs - ABG 7.41 / 24 / 50 / 16, Na 134, K 4.3, sr cr 1.78 (up from 1.14), alk phos 187, albumin 3.6, t bili 2.3 (up from 1.4 on 10/17), lactic 4.61, WBC 3.5, Hgb 15.3, and platelets 55.  CXR review demonstrates low lung volumes, mild vascular congestion and small pleural effusions.  He developed hypotension in the ER. The patient was treated with empiric antibiotics and fluid resuscitation.    PCCM called for ICU admit.    SUBJECTIVE/OVERNIGHT/INTERVAL HX 01/16/2016 - off pressors. Etting. Foley wsa changed yesterday and since then no pain and leak. Wife feels he is better. RN thinks meets dc crirteria   VITAL SIGNS: BP 132/75   Pulse 64   Temp 98.2 F (36.8 C) (Oral)   Resp 19   Ht _0  (1.651 m)   Wt 78.2 kg (172 lb 6.4 oz)   SpO2 94%   BMI 28.69 kg/m   HEMODYNAMICS:    VENTILATOR SETTINGS:    INTAKE / OUTPUT: I/O last 3 completed shifts: In: 4323.5 [P.O.:590; I.V.:2571; IV Piggyback:1162.5] Out: 2155 [Urine:2155]  PHYSICAL EXAMINATION: General:  Chronically ill  appearing elderly male in NAD  Neuro:  AOx3, MAE  HEENT:  MM tacky, no jvd Cardiovascular:  s1s2 rrr, no m/r/g Lungs:  Even/non-labored, lungs bilaterally diminished lower, no wheeze  Abdomen:  Soft, tender to palpation, especially over suprapubic region, bsx4 active  Musculoskeletal:  No acute deformities  Skin:  Warm/dry, no edema     The recent clinical data is shown below. Vitals:   01/16/16 0600 01/16/16 0700 01/16/16 0800 01/16/16 0858  BP: 135/79  132/75   Pulse: 71 71 64   Resp: (!) 21  19   Temp:    98.2 F (36.8 C)  TempSrc:    Oral  SpO2: 98% 96% 94%   Weight:      Height:         LABS:   PULMONARY  Recent Labs Lab 01/14/16 1219  PHART 7.417  PCO2ART 24.7*  PO2ART 50.0*  HCO3 16.0*  TCO2 17  O2SAT 87.0    CBC  Recent Labs Lab 01/14/16 1028 01/15/16 0045 01/16/16 0229  HGB 15.3 13.2 12.9*  HCT 45.0 39.6 38.6*  WBC 3.5* 26.4* 21.4*  PLT 55* 50* 53*    COAGULATION  Recent Labs Lab 01/14/16 1028  INR 1.21    CARDIAC   Recent Labs Lab 01/14/16 1849 01/15/16 0045 01/15/16 0543 01/15/16 1237 01/15/16 1917  TROPONINI 4.33* 7.70* 9.27* 8.28* 5.04*   No results for input(s): PROBNP in the last 168 hours.  CHEMISTRY  Recent Labs Lab 01/14/16 1028 01/14/16 1651 01/15/16 0045 01/15/16 0543 01/15/16 1526 01/16/16 0229  NA 134*  --  137 135 137 137  K 4.3  --  3.9 4.5 4.3 3.9  CL 100*  --  110 109 113* 113*  CO2 21*  --  18* 18* 17* 19*  GLUCOSE 149*  --  176* 159* 160* 123*  BUN 28*  --  32* 33* 34* 37*  CREATININE 1.78*  --  1.94* 1.97* 1.75* 1.42*  CALCIUM 9.7  --  8.4* 8.4* 7.8* 8.0*  MG  --  1.6*  --  2.1  --  2.0  PHOS  --  2.2*  --   --   --  2.4*   Estimated Creatinine Clearance: 36 mL/min (by C-G formula based on SCr of 1.42 mg/dL (H)).   LIVER  Recent Labs Lab 01/12/16 2316 01/14/16 1028 01/15/16 0045 01/15/16 1526 01/16/16 0229  AST 27 84* 138* 122* 92*  ALT 17 53 87* 85* 75*  ALKPHOS 66 187* 134*  137* 180*  BILITOT 1.4* 2.3* 2.3* 1.7* 1.3*  PROT 6.4* 6.7 5.2* 5.0* 5.0*  ALBUMIN 3.8 3.6 2.6* 2.4* 2.3*  INR  --  1.21  --   --   --      INFECTIOUS  Recent Labs Lab 01/14/16 1028  01/15/16 0543 01/15/16 0934 01/15/16 1119 01/15/16 1526 01/16/16 0229  LATICACIDVEN  --   < > 3.4* 3.2*  --  2.5*  --   PROCALCITON 3.12  --   --   --  79.67  --  39.79  < > = values in this interval not displayed.   ENDOCRINE CBG (last 3)   Recent Labs  01/14/16 1018 01/14/16 1634  GLUCAP 138* 159*         IMAGING x48h  - image(s) personally visualized  -   highlighted in bold US Abdomen Port  Result Date: 01/14/2016 CLINICAL DATA:  Cholecystectomy.  Elevated liver function tests . EXAM: ABDOMEN ULTRASOUND COMPLETE COMPARISON:  CT 12/10/2013.  MRI 12/21/2012. FINDINGS: Gallbladder: Cholecystectomy. Common bile duct: Diameter: 5.3 mm Liver: Heterogeneous parenchymal pattern. Mild intrahepatic biliary ductal dilatation cannot be excluded. IVC: No abnormality visualized. Pancreas: Difficult to visualize. Pancreatic duct is again noted to be slightly prominent 3.9 mm . Spleen: Size and appearance within normal limits. Right Kidney: Length: 12.7 cm. Echogenicity within normal limits. No hydronephrosis visualized. 5.2 x 5.0 x 4.9 cm simple cyst right kidney. Left Kidney: Length: 12.8 cm. Echogenicity within normal limits. No mass or hydronephrosis visualized. Abdominal aorta: No aneurysm visualized. Other findings: None. IMPRESSION: 1. Cholecystectomy. 2. Heterogeneous hepatic parenchymal pattern. Mild intrahepatic biliary ductal dilatation cannot be excluded. 3. Pancreas is difficult to visualize. Pancreatic duct is again noted to be slightly prominent at 3.9 mm. If symptoms persist MRCP can be obtained for further evaluation. Electronically Signed   By: Marcello Moores  Register   On: 01/14/2016 16:03   Dg Chest Port 1 View  Result Date: 01/15/2016 CLINICAL DATA:  Respiratory failure. EXAM: PORTABLE  CHEST 1 VIEW COMPARISON:  01/14/2016 . FINDINGS: Prior CABG. Cardiomegaly with mild pulmonary venous congestion and mild interstitial prominence. Slight improvement from prior exam. Persistent atelectasis left lung base. Persistent small left pleural effusion. IMPRESSION: 1. Prior CABG. Interim slight improvement of congestive heart failure and interstitial edema. Persistent small left pleural effusion. 2.  Persistent atelectasis left lung base. Electronically Signed   By: Marcello Moores  Register   On: 01/15/2016 07:53  STUDIES:  Abd [place] 10/19 >> Pancreatic duct slightly prominent ECHO 10/19 >>   CULTURES: BCx2 10/19 >> Enterococcus and Pseudomonas detected UC 10/19 >>  Influenza ? U. Legionella ? U. Strep 10/19 ?  ANTIBIOTICS: Vanco 10/19 >>  Zosyn 10/19 >>   SIGNIFICANT EVENTS: 10/17  Seen in ER for UTI, treated with Keflex, UC with multiple species  10/19  Admit with hypotension, AMS, concern for infection/sepsis; started on levophed  LINES/TUBES: PIV x2 10/19 >>   DISCUSSION: 82 y/o M with PMH of suprapubic catheter admitted 10/19 with hypotension, AMS, abdominal pain.  Work up concerning for sepsis - concern for urinary source.  WBC increasing.   ASSESSMENT / PLAN:  PULMONARY A: Small Bilateral Pleural Effusions - noted on admit, hx of CHF (EF 45-50%) Chronic Respiratory Alkalosis  Hypoxia on admit  Former Smoker Improved pulmonary edema on CXR 10/20    - stable P:   O2 to support sats > 90% Pulmonary hygiene - IS, mobilize  Intermittent CXR   CARDIOVASCULAR A:  Septic Shock - suspected hypovolemia + possible urinary source  Hx CAD, ICM (EF 35-40%), HTN (reportedly runs low, on imdur at baseline), HLD, CABG Elevated troponin, suspect NSTEMI - 4 > 4.33 > 7.7 > 9.27 BNP 719.7  - off pressors  P:  Assess ECHO  Cardiology consulted, appreciate recommendations -see their note 01/16/2016   :   AKI - in setting of suspected septic shock  Hx Prostate CA,  overactive bladder, urethral stricture, chronic cystitis & suprapubic catheter   - improving. Mild low phos  P:   Replete K phos Trend BMP / UOP  Replace electrolytes as indicated  Ensure adequate renal perfusion / avoid nephrotoxic agents   GASTROINTESTINAL A:   Abdominal Pain  Nausea / Vomiting Elevated T-Bili, Alk Phos; AST/ALT Hx GERD, Diverticulosis Pancreatic duct prominence on [place], s/p cholecystectomy   - improving LFTs P:   NPO x meds / ice chips  May need abdominal imaging pending clinical course   HEMATOLOGIC A:   Anemia  Thrombocytopenia - noted on lab review since 09/2015; Worsening 123 > 50   P:  Trend CBC  SCD's for DVT prophylaxis  ASA per cards 01/16/2016  - but [facility]ad to check with them 01/17/16  in setting of low platelets  INFECTIOUS Results for orders placed or performed during the hospital encounter of 01/14/16  Blood Culture (routine x 2)     Status: None (Preliminary result)   Collection Time: 01/14/16 10:20 AM  Result Value Ref Range Status   Specimen Description BLOOD RIGHT ANTECUBITAL  Final   Special Requests BOTTLES DRAWN AEROBIC AND ANAEROBIC  5CC  Final   Culture  Setup Time   Final    GRAM NEGATIVE RODS IN BOTH AEROBIC AND ANAEROBIC BOTTLES CRITICAL RESULT CALLED TO, READ BACK BY AND VERIFIED WITH: KIM HAMMONS,PHARMD _0  01/15/16 MKELLY,MLT    Culture GRAM NEGATIVE RODS PSEUDOMONAS AERUGINOSA   Final   Report Status PENDING  Incomplete  Blood Culture (routine x 2)     Status: None (Preliminary result)   Collection Time: 01/14/16 10:47 AM  Result Value Ref Range Status   Specimen Description BLOOD RIGHT HAND  Final   Special Requests BOTTLES DRAWN AEROBIC AND ANAEROBIC  5CC  Final   Culture  Setup Time   Final    GRAM NEGATIVE RODS IN BOTH AEROBIC AND ANAEROBIC BOTTLES CRITICAL RESULT CALLED TO, READ BACK BY AND VERIFIED WITH: KIMBERELY HAMMONS,PHARMD _1  01/15/16 MKELLY,MLT  Culture GRAM NEGATIVE RODS PSEUDOMONAS  AERUGINOSA   Final   Report Status PENDING  Incomplete  Blood Culture ID Panel (Reflexed)     Status: Abnormal   Collection Time: 01/14/16 10:47 AM  Result Value Ref Range Status   Enterococcus species NOT DETECTED NOT DETECTED Final   Listeria monocytogenes NOT DETECTED NOT DETECTED Final   Staphylococcus species NOT DETECTED NOT DETECTED Final   Staphylococcus aureus NOT DETECTED NOT DETECTED Final   Streptococcus species NOT DETECTED NOT DETECTED Final   Streptococcus agalactiae NOT DETECTED NOT DETECTED Final   Streptococcus pneumoniae NOT DETECTED NOT DETECTED Final   Streptococcus pyogenes NOT DETECTED NOT DETECTED Final   Acinetobacter baumannii NOT DETECTED NOT DETECTED Final   Enterobacteriaceae species DETECTED (A) NOT DETECTED Final    Comment: CRITICAL RESULT CALLED TO, READ BACK BY AND VERIFIED WITH: KIM HAMMONS,PHARMD _0  01/15/16 MKELLY,MLT    Enterobacter cloacae complex NOT DETECTED NOT DETECTED Final   Escherichia coli NOT DETECTED NOT DETECTED Final   Klebsiella oxytoca NOT DETECTED NOT DETECTED Final   Klebsiella pneumoniae NOT DETECTED NOT DETECTED Final   Proteus species NOT DETECTED NOT DETECTED Final   Serratia marcescens NOT DETECTED NOT DETECTED Final   Carbapenem resistance NOT DETECTED NOT DETECTED Final   Haemophilus influenzae NOT DETECTED NOT DETECTED Final   Neisseria meningitidis NOT DETECTED NOT DETECTED Final   Pseudomonas aeruginosa DETECTED (A) NOT DETECTED Final    Comment: CRITICAL RESULT CALLED TO, READ BACK BY AND VERIFIED WITH: KIM HAMMONS,PHARMD _1  01/15/16 MKELLY,MLT    Candida albicans NOT DETECTED NOT DETECTED Final   Candida glabrata NOT DETECTED NOT DETECTED Final   Candida krusei NOT DETECTED NOT DETECTED Final   Candida parapsilosis NOT DETECTED NOT DETECTED Final   Candida tropicalis NOT DETECTED NOT DETECTED Final  Urine culture     Status: Abnormal   Collection Time: 01/14/16 11:18 AM  Result Value Ref Range Status    Specimen Description URINE, RANDOM  Final   Special Requests NONE  Final   Culture MULTIPLE SPECIES PRESENT, SUGGEST RECOLLECTION (A)  Final   Report Status 01/15/2016 FINAL  Final  MRSA PCR Screening     Status: None   Collection Time: 01/14/16  4:51 PM  Result Value Ref Range Status   MRSA by PCR NEGATIVE NEGATIVE Final    Comment:        The GeneXpert MRSA Assay (FDA approved for NASAL specimens only), is one component of a comprehensive MRSA colonization surveillance program. It is not intended to diagnose MRSA infection nor to guide or monitor treatment for MRSA infections.     A:   Septic shock, bactermeia P:   Cultures as above  Anti-infectives    Start     Dose/Rate Route Frequency Ordered Stop   01/16/16 2200  cefTAZidime (FORTAZ) 2 g in dextrose 5 % 50 mL IVPB     2 g 100 mL/hr over 30 Minutes Intravenous Every 12 hours 01/16/16 1113     01/15/16 1100  vancomycin (VANCOCIN) IVPB 750 mg/150 ml premix  Status:  Discontinued     750 mg 150 mL/hr over 60 Minutes Intravenous Every 24 hours 01/14/16 1156 01/15/16 0150   01/15/16 1000  cefTAZidime (FORTAZ) 2 g in dextrose 5 % 50 mL IVPB  Status:  Discontinued     2 g 100 mL/hr over 30 Minutes Intravenous Every 24 hours 01/15/16 0933 01/16/16 1113   01/14/16 1700  piperacillin-tazobactam (ZOSYN) IVPB 3.375 g  Status:  Discontinued  3.375 g 12.5 mL/hr over 240 Minutes Intravenous Every 8 hours 01/14/16 1156 01/15/16 0933   01/14/16 1200  piperacillin-tazobactam (ZOSYN) IVPB 3.375 g  Status:  Discontinued     3.375 g 100 mL/hr over 30 Minutes Intravenous  Once 01/14/16 1151 01/14/16 1155   01/14/16 1200  vancomycin (VANCOCIN) IVPB 1000 mg/200 mL premix  Status:  Discontinued     1,000 mg 200 mL/hr over 60 Minutes Intravenous  Once 01/14/16 1151 01/14/16 1156   01/14/16 1045  vancomycin (VANCOCIN) 1,500 mg in sodium chloride 0.9 % 500 mL IVPB     1,500 mg 250 mL/hr over 120 Minutes Intravenous  Once 01/14/16 1034  01/14/16 1404   01/14/16 1030  piperacillin-tazobactam (ZOSYN) IVPB 3.375 g     3.375 g 100 mL/hr over 30 Minutes Intravenous  Once 01/14/16 1028 01/14/16 1209   01/14/16 1030  vancomycin (VANCOCIN) IVPB 1000 mg/200 mL premix  Status:  Discontinued     1,000 mg 200 mL/hr over 60 Minutes Intravenous  Once 01/14/16 1028 01/14/16 1034         ENDOCRINE A:   No acute Issues  Cortisol appropriately elevated at > 100 P:   Monitor glucose q4h   NEUROLOGIC A:   Hx Mild Dementia   - stable currently P:   RASS goal: n/a Monitor / supportive care  Promote sleep / wake cycle Hold home galantamine 10/19, restart in am 10/21 per clinical stituation  FAMILY  - Updates:  Family & patient updated at bedside 10/19, Dr Chase Caller and resident on 01/15/16 and Dr Chase Caller on 01/16/16   - Inter-disciplinary family meet or Palliative Care meeting due by: Ongoing.  Approached discussion regarding ACLS / life support and family wanted to discuss further.  Full code for now.   GLOBAL Move to tele. TRH primary from 01/17/16 and pccm off   Dr. Brand Males, M.D., [facility].C.P Pulmonary and Critical Care Medicine Staff Physician [facility] Pager: [phone], If no answer or between  15:00h - 7:00h: call [phone]  01/16/2016 12:31 PM

## 2016-01-16 NOTE — Progress Notes (Signed)
Pharmacy Antibiotic Note  Ronald [name] is a 82 y.o. male admitted on 01/14/2016 with sepsis.  Pharmacy has been consulted for vancomycin and zosyn dosing.   BCID is positive for Enterobacteriaceae species and Pseudomonas. Pharmacy is consulted to transition patient from zosyn to ceftazidime. Renal function continues to improve. Will increase ceftazidime frequency.  Plan: Ceftazidime 2g IV q12h Monitor culture data, renal function and clinical course  Height: 5\' 5"  (165.1 cm) Weight: 172 lb 6.4 oz (78.2 kg) IBW/kg (Calculated) : 61.5  Temp (24hrs), Avg:97.8 F (36.6 C), Min:97.3 F (36.3 C), Max:98.2 F (36.8 C)   Recent Labs Lab 01/12/16 2316  01/14/16 1028  01/14/16 1651 01/14/16 1849 01/15/16 0045 01/15/16 0543 01/15/16 0934 01/15/16 1526 01/16/16 0229  WBC 13.2*  --  3.5*  --   --   --  26.4*  --   --   --  21.4*  CREATININE 1.14  --  1.78*  --   --   --  1.94* 1.97*  --  1.75* 1.42*  LATICACIDVEN  --   < >  --   < > 3.3* 3.2*  --  3.4* 3.2* 2.5*  --   < > = values in this interval not displayed.  Estimated Creatinine Clearance: 36 mL/min (by C-G formula based on SCr of 1.42 mg/dL (H)).    Allergies  Allergen Reactions  . Pantoprazole Other (See Comments)    Headache and lightheaded  . Nitrofurantoin Hives  . Sulfa Antibiotics Hives and Itching    Antimicrobials this admission: Vanc 10/19>>10/20 Zosyn 10/19>>10/20 Ceftazidime 10/20>>  Dose adjustments this admission: N/A  Microbiology results: 10/19 BCx: Pseudomonas 10/19 BCID: Enterobacteriaceae species and Pseudomonas 10/19 UCx: mult species  10/19 MRSA PCR: neg  Andrey Cota. Diona Foley, PharmD, [place] Clinical Pharmacist Pager [phone] 01/16/2016 11:13 AM

## 2016-01-16 NOTE — Progress Notes (Signed)
DAILY PROGRESS NOTE  Subjective:  No angina. Sitting up in the chair eating. Off pressors. Echo reviewed yesterday, LVEF is lower at 40-45%, there appears to be a new anterior wall motion abnormality, which could suggest graft failure - possibly to the diagonal given the degree of troponin elevation. Labs appear to be improving.   Objective:  Temp:  [97.3 F (36.3 C)-98.2 F (36.8 C)] 98.2 F (36.8 C) (10/21 0858) Pulse Rate:  [44-119] 64 (10/21 0800) Resp:  [14-27] 19 (10/21 0800) BP: (82-139)/(47-91) 132/75 (10/21 0800) SpO2:  [93 %-100 %] 94 % (10/21 0800) Weight:  [172 lb 6.4 oz (78.2 kg)] 172 lb 6.4 oz (78.2 kg) (10/21 0500) Weight change: 12 lb 6.4 oz (5.625 kg)  Intake/Output from previous day: 10/20 0701 - 10/21 0700 In: 3240.9 [P.O.:590; I.V.:1650.9; IV Piggyback:1000] Out: 2005 [Urine:2005]  Intake/Output from this shift: Total I/O In: 200 [I.V.:200] Out: 350 [Urine:350]  Medications: No current facility-administered medications on file prior to encounter.    Current Outpatient Prescriptions on File Prior to Encounter  Medication Sig Dispense Refill  . acetaminophen (TYLENOL) 500 MG tablet Take 500 mg by mouth every 6 (six) hours as needed for pain or fever.     Marland Kitchen aspirin EC 81 MG EC tablet Take 1 tablet (81 mg total) by mouth daily. 30 tablet 0  . atorvastatin (LIPITOR) 80 MG tablet Take 40 mg by mouth every evening.    . cephALEXin (KEFLEX) 500 MG capsule Take 1 capsule (500 mg total) by mouth 2 (two) times daily. 14 capsule 0  . Cholecalciferol (VITAMIN D) 2000 UNITS tablet Take 2,000 Units by mouth daily.    . flavoxATE (URISPAS) 100 MG tablet Take 100 mg by mouth 3 (three) times daily as needed for bladder spasms.    Marland Kitchen galantamine (RAZADYNE ER) 8 MG 24 hr capsule Take 8 mg by mouth daily with breakfast.    . hydroxypropyl methylcellulose (ISOPTO TEARS) 2.5 % ophthalmic solution Place 1 drop into both eyes 2 (two) times daily.     . isosorbide mononitrate  (IMDUR) 60 MG 24 hr tablet Take 0.5 tablets (30 mg total) by mouth every morning.    . Multiple Vitamin (MULTIVITAMIN WITH MINERALS) TABS Take 1 tablet by mouth daily.    . nitroGLYCERIN (NITROSTAT) 0.4 MG SL tablet Place 0.4 mg under the tongue every 5 (five) minutes as needed for chest pain.       Physical Exam: General appearance: alert and no distress Lungs: clear to auscultation bilaterally Heart: regular rate and rhythm Extremities: extremities normal, atraumatic, no cyanosis or edema Neurologic: Grossly normal  Lab Results: Results for orders placed or performed during the hospital encounter of 01/14/16 (from the past 48 hour(s))  CBG monitoring, ED     Status: Abnormal   Collection Time: 01/14/16 10:18 AM  Result Value Ref Range   Glucose-Capillary 138 (H) 65 - 99 mg/dL  Blood Culture (routine x 2)     Status: None (Preliminary result)   Collection Time: 01/14/16 10:20 AM  Result Value Ref Range   Specimen Description BLOOD RIGHT ANTECUBITAL    Special Requests BOTTLES DRAWN AEROBIC AND ANAEROBIC  5CC    Culture  Setup Time      GRAM NEGATIVE RODS IN BOTH AEROBIC AND ANAEROBIC BOTTLES CRITICAL RESULT CALLED TO, READ BACK BY AND VERIFIED WITH: KIM HAMMONS,PHARMD _0  01/15/16 MKELLY,MLT    Culture      GRAM NEGATIVE RODS CULTURE REINCUBATED FOR BETTER GROWTH  Report Status PENDING   Comprehensive metabolic panel     Status: Abnormal   Collection Time: 01/14/16 10:28 AM  Result Value Ref Range   Sodium 134 (L) 135 - 145 mmol/L   Potassium 4.3 3.5 - 5.1 mmol/L   Chloride 100 (L) 101 - 111 mmol/L   CO2 21 (L) 22 - 32 mmol/L   Glucose, Bld 149 (H) 65 - 99 mg/dL   BUN 28 (H) 6 - 20 mg/dL   Creatinine, Ser 1.78 (H) 0.61 - 1.24 mg/dL   Calcium 9.7 8.9 - 10.3 mg/dL   Total Protein 6.7 6.5 - 8.1 g/dL   Albumin 3.6 3.5 - 5.0 g/dL   AST 84 (H) 15 - 41 U/L   ALT 53 17 - 63 U/L   Alkaline Phosphatase 187 (H) 38 - 126 U/L   Total Bilirubin 2.3 (H) 0.3 - 1.2 mg/dL   GFR  calc non Af Amer 33 (L) >60 mL/min   GFR calc Af Amer 38 (L) >60 mL/min    Comment: (NOTE) The eGFR has been calculated using the CKD EPI equation. This calculation has not been validated in all clinical situations. eGFR's persistently <60 mL/min signify possible Chronic Kidney Disease.    Anion gap 13 5 - 15  CBC WITH DIFFERENTIAL     Status: Abnormal   Collection Time: 01/14/16 10:28 AM  Result Value Ref Range   WBC 3.5 (L) 4.0 - 10.5 K/uL   RBC 4.95 4.22 - 5.81 MIL/uL   Hemoglobin 15.3 13.0 - 17.0 g/dL   HCT 45.0 39.0 - 52.0 %   MCV 90.9 78.0 - 100.0 fL   MCH 30.9 26.0 - 34.0 pg   MCHC 34.0 30.0 - 36.0 g/dL   RDW 13.8 11.5 - 15.5 %   Platelets 55 (L) 150 - 400 K/uL    Comment: REPEATED TO VERIFY DELTA CHECK NOTED PLATELET COUNT CONFIRMED BY SMEAR    Neutrophils Relative % 97 %   Neutro Abs 3.4 1.7 - 7.7 K/uL   Lymphocytes Relative 2 %   Lymphs Abs 0.1 (L) 0.7 - 4.0 K/uL   Monocytes Relative 1 %   Monocytes Absolute 0.0 (L) 0.1 - 1.0 K/uL   Eosinophils Relative 0 %   Eosinophils Absolute 0.0 0.0 - 0.7 K/uL   Basophils Relative 0 %   Basophils Absolute 0.0 0.0 - 0.1 K/uL  Procalcitonin     Status: None   Collection Time: 01/14/16 10:28 AM  Result Value Ref Range   Procalcitonin 3.12 ng/mL    Comment:        Interpretation: PCT > 2 ng/mL: Systemic infection (sepsis) is likely, unless other causes are known. (NOTE)         ICU PCT Algorithm               Non ICU PCT Algorithm    ----------------------------     ------------------------------         PCT < 0.25 ng/mL                 PCT < 0.1 ng/mL     Stopping of antibiotics            Stopping of antibiotics       strongly encouraged.               strongly encouraged.    ----------------------------     ------------------------------       PCT level decrease by  PCT < 0.25 ng/mL       >= 80% from peak PCT       OR PCT 0.25 - 0.5 ng/mL          Stopping of antibiotics                                              encouraged.     Stopping of antibiotics           encouraged.    ----------------------------     ------------------------------       PCT level decrease by              PCT >= 0.25 ng/mL       < 80% from peak PCT        AND PCT >= 0.5 ng/mL            Continuing antibiotics                                               encouraged.       Continuing antibiotics            encouraged.    ----------------------------     ------------------------------     PCT level increase compared          PCT > 0.5 ng/mL         with peak PCT AND          PCT >= 0.5 ng/mL             Escalation of antibiotics                                          strongly encouraged.      Escalation of antibiotics        strongly encouraged.   Protime-INR     Status: Abnormal   Collection Time: 01/14/16 10:28 AM  Result Value Ref Range   Prothrombin Time 15.3 (H) 11.4 - 15.2 seconds   INR 1.21   APTT     Status: None   Collection Time: 01/14/16 10:28 AM  Result Value Ref Range   aPTT 26 24 - 36 seconds  Fibrinogen     Status: Abnormal   Collection Time: 01/14/16 10:28 AM  Result Value Ref Range   Fibrinogen 605 (H) 210 - 475 mg/dL  I-Stat CG4 Lactic Acid, ED  (not at  [facility])     Status: Abnormal   Collection Time: 01/14/16 10:45 AM  Result Value Ref Range   Lactic Acid, Venous 4.61 (HH) 0.5 - 1.9 mmol/L   Comment NOTIFIED PHYSICIAN   Blood Culture (routine x 2)     Status: None (Preliminary result)   Collection Time: 01/14/16 10:47 AM  Result Value Ref Range   Specimen Description BLOOD RIGHT HAND    Special Requests BOTTLES DRAWN AEROBIC AND ANAEROBIC  5CC    Culture  Setup Time      GRAM NEGATIVE RODS IN BOTH AEROBIC AND ANAEROBIC BOTTLES CRITICAL RESULT CALLED TO, READ BACK BY AND VERIFIED WITH: KIMBERELY HAMMONS,PHARMD _0  01/15/16 MKELLY,MLT    Culture  GRAM NEGATIVE RODS CULTURE REINCUBATED FOR BETTER GROWTH    Report Status PENDING   Blood Culture ID Panel (Reflexed)      Status: Abnormal   Collection Time: 01/14/16 10:47 AM  Result Value Ref Range   Enterococcus species NOT DETECTED NOT DETECTED   Listeria monocytogenes NOT DETECTED NOT DETECTED   Staphylococcus species NOT DETECTED NOT DETECTED   Staphylococcus aureus NOT DETECTED NOT DETECTED   Streptococcus species NOT DETECTED NOT DETECTED   Streptococcus agalactiae NOT DETECTED NOT DETECTED   Streptococcus pneumoniae NOT DETECTED NOT DETECTED   Streptococcus pyogenes NOT DETECTED NOT DETECTED   Acinetobacter baumannii NOT DETECTED NOT DETECTED   Enterobacteriaceae species DETECTED (A) NOT DETECTED    Comment: CRITICAL RESULT CALLED TO, READ BACK BY AND VERIFIED WITH: KIM HAMMONS,PHARMD _0  01/15/16 MKELLY,MLT    Enterobacter cloacae complex NOT DETECTED NOT DETECTED   Escherichia coli NOT DETECTED NOT DETECTED   Klebsiella oxytoca NOT DETECTED NOT DETECTED   Klebsiella pneumoniae NOT DETECTED NOT DETECTED   Proteus species NOT DETECTED NOT DETECTED   Serratia marcescens NOT DETECTED NOT DETECTED   Carbapenem resistance NOT DETECTED NOT DETECTED   Haemophilus influenzae NOT DETECTED NOT DETECTED   Neisseria meningitidis NOT DETECTED NOT DETECTED   Pseudomonas aeruginosa DETECTED (A) NOT DETECTED    Comment: CRITICAL RESULT CALLED TO, READ BACK BY AND VERIFIED WITH: KIM HAMMONS,PHARMD _1  01/15/16 MKELLY,MLT    Candida albicans NOT DETECTED NOT DETECTED   Candida glabrata NOT DETECTED NOT DETECTED   Candida krusei NOT DETECTED NOT DETECTED   Candida parapsilosis NOT DETECTED NOT DETECTED   Candida tropicalis NOT DETECTED NOT DETECTED  Urinalysis, Routine w reflex microscopic (not at [facility])     Status: Abnormal   Collection Time: 01/14/16 11:18 AM  Result Value Ref Range   Color, Urine AMBER (A) YELLOW    Comment: BIOCHEMICALS MAY BE AFFECTED BY COLOR   APPearance TURBID (A) CLEAR   Specific Gravity, Urine 1.011 1.005 - 1.030   pH 8.0 5.0 - 8.0   Glucose, UA NEGATIVE NEGATIVE mg/dL    Hgb urine dipstick LARGE (A) NEGATIVE   Bilirubin Urine SMALL (A) NEGATIVE   Ketones, ur 15 (A) NEGATIVE mg/dL   Protein, ur 30 (A) NEGATIVE mg/dL   Nitrite NEGATIVE NEGATIVE   Leukocytes, UA LARGE (A) NEGATIVE  Urine culture     Status: Abnormal   Collection Time: 01/14/16 11:18 AM  Result Value Ref Range   Specimen Description URINE, RANDOM    Special Requests NONE    Culture MULTIPLE SPECIES PRESENT, SUGGEST RECOLLECTION (A)    Report Status 01/15/2016 FINAL   Strep pneumoniae urinary antigen     Status: None   Collection Time: 01/14/16 11:18 AM  Result Value Ref Range   Strep Pneumo Urinary Antigen NEGATIVE NEGATIVE    Comment:        Infection due to S. pneumoniae cannot be absolutely ruled out since the antigen present may be below the detection limit of the test.   Urine microscopic-add on     Status: Abnormal   Collection Time: 01/14/16 11:18 AM  Result Value Ref Range   Squamous Epithelial / LPF NONE SEEN NONE SEEN   WBC, UA TOO NUMEROUS TO COUNT 0 - 5 WBC/hpf   RBC / HPF 6-30 0 - 5 RBC/hpf   Bacteria, UA MANY (A) NONE SEEN   Crystals TRIPLE PHOSPHATE CRYSTALS (A) NEGATIVE  Influenza panel by PCR (type A & B, H1N1)     Status:  None   Collection Time: 01/14/16 11:53 AM  Result Value Ref Range   Influenza A By PCR NEGATIVE NEGATIVE   Influenza B By PCR NEGATIVE NEGATIVE   H1N1 flu by pcr NOT DETECTED NOT DETECTED    Comment:        The Xpert Flu assay (FDA approved for nasal aspirates or washes and nasopharyngeal swab specimens), is intended as an aid in the diagnosis of influenza and should not be used as a sole basis for treatment.   Lactic acid, plasma     Status: Abnormal   Collection Time: 01/14/16 12:16 PM  Result Value Ref Range   Lactic Acid, Venous 3.3 (HH) 0.5 - 1.9 mmol/L    Comment: CRITICAL RESULT CALLED TO, READ BACK BY AND VERIFIED WITH: T.GROSC,RN 01/14/16 _0  BY V.WILKINS   Cortisol     Status: None   Collection Time: 01/14/16 12:16 PM    Result Value Ref Range   Cortisol, Plasma >100.0 ug/dL    Comment: RESULTS CONFIRMED BY MANUAL DILUTION (NOTE) AM    6.7 - 22.6 ug/dL PM   <10.0       ug/dL   I-Stat arterial blood gas, ED     Status: Abnormal   Collection Time: 01/14/16 12:19 PM  Result Value Ref Range   pH, Arterial 7.417 7.350 - 7.450   pCO2 arterial 24.7 (L) 32.0 - 48.0 mmHg   pO2, Arterial 50.0 (L) 83.0 - 108.0 mmHg   Bicarbonate 16.0 (L) 20.0 - 28.0 mmol/L   TCO2 17 0 - 100 mmol/L   O2 Saturation 87.0 %   Acid-base deficit 7.0 (H) 0.0 - 2.0 mmol/L   Patient temperature 98.0 F    Collection site RADIAL, ALLEN'S TEST ACCEPTABLE    Sample type ARTERIAL   Glucose, capillary     Status: Abnormal   Collection Time: 01/14/16  4:34 PM  Result Value Ref Range   Glucose-Capillary 159 (H) 65 - 99 mg/dL   Comment 1 Notify RN    Comment 2 Document in Chart   Magnesium     Status: Abnormal   Collection Time: 01/14/16  4:51 PM  Result Value Ref Range   Magnesium 1.6 (L) 1.7 - 2.4 mg/dL  Phosphorus     Status: Abnormal   Collection Time: 01/14/16  4:51 PM  Result Value Ref Range   Phosphorus 2.2 (L) 2.5 - 4.6 mg/dL  Amylase     Status: None   Collection Time: 01/14/16  4:51 PM  Result Value Ref Range   Amylase 36 28 - 100 U/L  Lipase, blood     Status: None   Collection Time: 01/14/16  4:51 PM  Result Value Ref Range   Lipase 17 11 - 51 U/L  Troponin I     Status: Abnormal   Collection Time: 01/14/16  4:51 PM  Result Value Ref Range   Troponin I 4.06 (HH) <0.03 ng/mL    Comment: CRITICAL VALUE NOTED.  VALUE IS CONSISTENT WITH PREVIOUSLY REPORTED AND CALLED VALUE.  Lactic acid, plasma     Status: Abnormal   Collection Time: 01/14/16  4:51 PM  Result Value Ref Range   Lactic Acid, Venous 3.3 (HH) 0.5 - 1.9 mmol/L    Comment: CRITICAL RESULT CALLED TO, READ BACK BY AND VERIFIED WITH: K [facility] 1803 01/14/2016 WBOND   Brain natriuretic peptide     Status: Abnormal   Collection Time: 01/14/16  4:51 PM   Result Value Ref Range   B Natriuretic Peptide  719.7 (H) 0.0 - 100.0 pg/mL  Cortisol     Status: None   Collection Time: 01/14/16  4:51 PM  Result Value Ref Range   Cortisol, Plasma >100.0 ug/dL    Comment: RESULTS CONFIRMED BY MANUAL DILUTION (NOTE) AM    6.7 - 22.6 ug/dL PM   <10.0       ug/dL   MRSA PCR Screening     Status: None   Collection Time: 01/14/16  4:51 PM  Result Value Ref Range   MRSA by PCR NEGATIVE NEGATIVE    Comment:        The GeneXpert MRSA Assay (FDA approved for NASAL specimens only), is one component of a comprehensive MRSA colonization surveillance program. It is not intended to diagnose MRSA infection nor to guide or monitor treatment for MRSA infections.   Troponin I     Status: Abnormal   Collection Time: 01/14/16  6:49 PM  Result Value Ref Range   Troponin I 4.33 (HH) <0.03 ng/mL    Comment: CRITICAL RESULT CALLED TO, READ BACK BY AND VERIFIED WITH: J DUNLAP,RN 1951 01/14/2016 WBOND   Lactic acid, plasma     Status: Abnormal   Collection Time: 01/14/16  6:49 PM  Result Value Ref Range   Lactic Acid, Venous 3.2 (HH) 0.5 - 1.9 mmol/L    Comment: CRITICAL RESULT CALLED TO, READ BACK BY AND VERIFIED WITH: J DUNLAP,RN 1952 01/14/2016 WBOND   Troponin I     Status: Abnormal   Collection Time: 01/15/16 12:45 AM  Result Value Ref Range   Troponin I 7.70 (HH) <0.03 ng/mL    Comment: CRITICAL VALUE NOTED.  VALUE IS CONSISTENT WITH PREVIOUSLY REPORTED AND CALLED VALUE.  CBC     Status: Abnormal   Collection Time: 01/15/16 12:45 AM  Result Value Ref Range   WBC 26.4 (H) 4.0 - 10.5 K/uL    Comment: REPEATED TO VERIFY   RBC 4.31 4.22 - 5.81 MIL/uL   Hemoglobin 13.2 13.0 - 17.0 g/dL   HCT 39.6 39.0 - 52.0 %   MCV 91.9 78.0 - 100.0 fL   MCH 30.6 26.0 - 34.0 pg   MCHC 33.3 30.0 - 36.0 g/dL   RDW 14.2 11.5 - 15.5 %   Platelets 50 (L) 150 - 400 K/uL    Comment: CONSISTENT WITH PREVIOUS RESULT  Comprehensive metabolic panel     Status: Abnormal    Collection Time: 01/15/16 12:45 AM  Result Value Ref Range   Sodium 137 135 - 145 mmol/L   Potassium 3.9 3.5 - 5.1 mmol/L   Chloride 110 101 - 111 mmol/L   CO2 18 (L) 22 - 32 mmol/L   Glucose, Bld 176 (H) 65 - 99 mg/dL   BUN 32 (H) 6 - 20 mg/dL   Creatinine, Ser 1.94 (H) 0.61 - 1.24 mg/dL   Calcium 8.4 (L) 8.9 - 10.3 mg/dL   Total Protein 5.2 (L) 6.5 - 8.1 g/dL   Albumin 2.6 (L) 3.5 - 5.0 g/dL   AST 138 (H) 15 - 41 U/L   ALT 87 (H) 17 - 63 U/L   Alkaline Phosphatase 134 (H) 38 - 126 U/L   Total Bilirubin 2.3 (H) 0.3 - 1.2 mg/dL   GFR calc non Af Amer 30 (L) >60 mL/min   GFR calc Af Amer 34 (L) >60 mL/min    Comment: (NOTE) The eGFR has been calculated using the CKD EPI equation. This calculation has not been validated in all clinical situations. eGFR's persistently <  60 mL/min signify possible Chronic Kidney Disease.    Anion gap 9 5 - 15  Lactic acid, plasma     Status: Abnormal   Collection Time: 01/15/16  5:43 AM  Result Value Ref Range   Lactic Acid, Venous 3.4 (HH) 0.5 - 1.9 mmol/L    Comment: CRITICAL RESULT CALLED TO, READ BACK BY AND VERIFIED WITH: CHRISTIAN SMITH,RN AT 1324 01/15/16 BY ZBEECH.   Troponin I (q 6hr x 3)     Status: Abnormal   Collection Time: 01/15/16  5:43 AM  Result Value Ref Range   Troponin I 9.27 (HH) <0.03 ng/mL    Comment: CRITICAL VALUE NOTED.  VALUE IS CONSISTENT WITH PREVIOUSLY REPORTED AND CALLED VALUE.  Basic metabolic panel     Status: Abnormal   Collection Time: 01/15/16  5:43 AM  Result Value Ref Range   Sodium 135 135 - 145 mmol/L   Potassium 4.5 3.5 - 5.1 mmol/L   Chloride 109 101 - 111 mmol/L   CO2 18 (L) 22 - 32 mmol/L   Glucose, Bld 159 (H) 65 - 99 mg/dL   BUN 33 (H) 6 - 20 mg/dL   Creatinine, Ser 1.97 (H) 0.61 - 1.24 mg/dL   Calcium 8.4 (L) 8.9 - 10.3 mg/dL   GFR calc non Af Amer 29 (L) >60 mL/min   GFR calc Af Amer 34 (L) >60 mL/min    Comment: (NOTE) The eGFR has been calculated using the CKD EPI equation. This  calculation has not been validated in all clinical situations. eGFR's persistently <60 mL/min signify possible Chronic Kidney Disease.    Anion gap 8 5 - 15  Magnesium     Status: None   Collection Time: 01/15/16  5:43 AM  Result Value Ref Range   Magnesium 2.1 1.7 - 2.4 mg/dL  Lactic acid, plasma     Status: Abnormal   Collection Time: 01/15/16  9:34 AM  Result Value Ref Range   Lactic Acid, Venous 3.2 (HH) 0.5 - 1.9 mmol/L    Comment: CRITICAL RESULT CALLED TO, READ BACK BY AND VERIFIED WITH: J.BEABRAUT,RN 1025 01/15/16 CLARK,S   Procalcitonin - Baseline     Status: None   Collection Time: 01/15/16 11:19 AM  Result Value Ref Range   Procalcitonin 79.67 ng/mL    Comment:        Interpretation: PCT >= 10 ng/mL: Important systemic inflammatory response, almost exclusively due to severe bacterial sepsis or septic shock. (NOTE)         ICU PCT Algorithm               Non ICU PCT Algorithm    ----------------------------     ------------------------------         PCT < 0.25 ng/mL                 PCT < 0.1 ng/mL     Stopping of antibiotics            Stopping of antibiotics       strongly encouraged.               strongly encouraged.    ----------------------------     ------------------------------       PCT level decrease by               PCT < 0.25 ng/mL       >= 80% from peak PCT       OR PCT 0.25 - 0.5 ng/mL  Stopping of antibiotics                                             encouraged.     Stopping of antibiotics           encouraged.    ----------------------------     ------------------------------       PCT level decrease by              PCT >= 0.25 ng/mL       < 80% from peak PCT        AND PCT >= 0.5 ng/mL             Continuing antibiotics                                              encouraged.       Continuing antibiotics            encouraged.    ----------------------------     ------------------------------     PCT level increase compared           PCT > 0.5 ng/mL         with peak PCT AND          PCT >= 0.5 ng/mL             Escalation of antibiotics                                          strongly encouraged.      Escalation of antibiotics        strongly encouraged.   Troponin I (q 6hr x 3)     Status: Abnormal   Collection Time: 01/15/16 12:37 PM  Result Value Ref Range   Troponin I 8.28 (HH) <0.03 ng/mL    Comment: CRITICAL VALUE NOTED.  VALUE IS CONSISTENT WITH PREVIOUSLY REPORTED AND CALLED VALUE.  Lactic acid, plasma     Status: Abnormal   Collection Time: 01/15/16  3:26 PM  Result Value Ref Range   Lactic Acid, Venous 2.5 (HH) 0.5 - 1.9 mmol/L    Comment: CRITICAL RESULT CALLED TO, READ BACK BY AND VERIFIED WITH: J. BEABRAUT RN [id number] 2119 GREEN R   Comprehensive metabolic panel     Status: Abnormal   Collection Time: 01/15/16  3:26 PM  Result Value Ref Range   Sodium 137 135 - 145 mmol/L   Potassium 4.3 3.5 - 5.1 mmol/L   Chloride 113 (H) 101 - 111 mmol/L   CO2 17 (L) 22 - 32 mmol/L   Glucose, Bld 160 (H) 65 - 99 mg/dL   BUN 34 (H) 6 - 20 mg/dL   Creatinine, Ser 1.75 (H) 0.61 - 1.24 mg/dL   Calcium 7.8 (L) 8.9 - 10.3 mg/dL   Total Protein 5.0 (L) 6.5 - 8.1 g/dL   Albumin 2.4 (L) 3.5 - 5.0 g/dL   AST 122 (H) 15 - 41 U/L   ALT 85 (H) 17 - 63 U/L   Alkaline Phosphatase 137 (H) 38 - 126 U/L   Total Bilirubin 1.7 (H) 0.3 - 1.2 mg/dL   GFR calc non  Af Amer 34 (L) >60 mL/min   GFR calc Af Amer 39 (L) >60 mL/min    Comment: (NOTE) The eGFR has been calculated using the CKD EPI equation. This calculation has not been validated in all clinical situations. eGFR's persistently <60 mL/min signify possible Chronic Kidney Disease.    Anion gap 7 5 - 15  Troponin I (q 6hr x 3)     Status: Abnormal   Collection Time: 01/15/16  7:17 PM  Result Value Ref Range   Troponin I 5.04 (HH) <0.03 ng/mL    Comment: CRITICAL VALUE NOTED.  VALUE IS CONSISTENT WITH PREVIOUSLY REPORTED AND CALLED VALUE.  Procalcitonin     Status:  None   Collection Time: 01/16/16  2:29 AM  Result Value Ref Range   Procalcitonin 39.79 ng/mL    Comment:        Interpretation: PCT >= 10 ng/mL: Important systemic inflammatory response, almost exclusively due to severe bacterial sepsis or septic shock. (NOTE)         ICU PCT Algorithm               Non ICU PCT Algorithm    ----------------------------     ------------------------------         PCT < 0.25 ng/mL                 PCT < 0.1 ng/mL     Stopping of antibiotics            Stopping of antibiotics       strongly encouraged.               strongly encouraged.    ----------------------------     ------------------------------       PCT level decrease by               PCT < 0.25 ng/mL       >= 80% from peak PCT       OR PCT 0.25 - 0.5 ng/mL          Stopping of antibiotics                                             encouraged.     Stopping of antibiotics           encouraged.    ----------------------------     ------------------------------       PCT level decrease by              PCT >= 0.25 ng/mL       < 80% from peak PCT        AND PCT >= 0.5 ng/mL             Continuing antibiotics                                              encouraged.       Continuing antibiotics            encouraged.    ----------------------------     ------------------------------     PCT level increase compared          PCT > 0.5 ng/mL         with peak PCT AND  PCT >= 0.5 ng/mL             Escalation of antibiotics                                          strongly encouraged.      Escalation of antibiotics        strongly encouraged.   Comprehensive metabolic panel     Status: Abnormal   Collection Time: 01/16/16  2:29 AM  Result Value Ref Range   Sodium 137 135 - 145 mmol/L   Potassium 3.9 3.5 - 5.1 mmol/L   Chloride 113 (H) 101 - 111 mmol/L   CO2 19 (L) 22 - 32 mmol/L   Glucose, Bld 123 (H) 65 - 99 mg/dL   BUN 37 (H) 6 - 20 mg/dL   Creatinine, Ser 1.42 (H) 0.61 - 1.24  mg/dL   Calcium 8.0 (L) 8.9 - 10.3 mg/dL   Total Protein 5.0 (L) 6.5 - 8.1 g/dL   Albumin 2.3 (L) 3.5 - 5.0 g/dL   AST 92 (H) 15 - 41 U/L   ALT 75 (H) 17 - 63 U/L   Alkaline Phosphatase 180 (H) 38 - 126 U/L   Total Bilirubin 1.3 (H) 0.3 - 1.2 mg/dL   GFR calc non Af Amer 43 (L) >60 mL/min   GFR calc Af Amer 50 (L) >60 mL/min    Comment: (NOTE) The eGFR has been calculated using the CKD EPI equation. This calculation has not been validated in all clinical situations. eGFR's persistently <60 mL/min signify possible Chronic Kidney Disease.    Anion gap 5 5 - 15  Phosphorus     Status: Abnormal   Collection Time: 01/16/16  2:29 AM  Result Value Ref Range   Phosphorus 2.4 (L) 2.5 - 4.6 mg/dL  Magnesium     Status: None   Collection Time: 01/16/16  2:29 AM  Result Value Ref Range   Magnesium 2.0 1.7 - 2.4 mg/dL  CBC     Status: Abnormal   Collection Time: 01/16/16  2:29 AM  Result Value Ref Range   WBC 21.4 (H) 4.0 - 10.5 K/uL   RBC 4.27 4.22 - 5.81 MIL/uL   Hemoglobin 12.9 (L) 13.0 - 17.0 g/dL   HCT 38.6 (L) 39.0 - 52.0 %   MCV 90.4 78.0 - 100.0 fL   MCH 30.2 26.0 - 34.0 pg   MCHC 33.4 30.0 - 36.0 g/dL   RDW 14.5 11.5 - 15.5 %   Platelets 53 (L) 150 - 400 K/uL    Comment: REPEATED TO VERIFY SPECIMEN CHECKED FOR CLOTS PLATELET COUNT CONFIRMED BY SMEAR     Imaging: Imaging results have been reviewed  Assessment:  Principal Problem:   Septic shock ([place]) Active Problems:   HTN (hypertension)   Chronic systolic CHF (congestive heart failure) (HCC)   Sepsis (HCC)   Altered mental state   NSTEMI (non-ST elevated myocardial infarction) ([place])   Plan:  1. Mr. Stilley appears to have had either a Type 1 or Type 2 NSTEMI - troponin is higher than expected for demand ischemia. It is possible there may have been graft compromise. At this point he is improving and troponin is declining. He is not complaining of angina. Would not pursue cardiac cath - but could consider stress  test at some point after recovery to look for high risk ischemia. Would re-institute medical  therapy. Restart aspirin and lipitor 80 mg QHS. Would add plavix 75 mg daily, if no acute procedures are planned. If bp tolerates, consider low-dose b-blocker. Would not restart nitrate.  Time Spent Directly with Patient:  15 minutes  Length of Stay:  LOS: 2 days   Ronald Casino, Ronald [name], [facility] Attending Cardiologist [place] 01/16/2016, 9:11 AM

## 2016-01-17 ENCOUNTER — Encounter (HOSPITAL_COMMUNITY): Payer: Self-pay

## 2016-01-17 DIAGNOSIS — R109 Unspecified abdominal pain: Secondary | ICD-10-CM

## 2016-01-17 LAB — RENAL FUNCTION PANEL
Albumin: 2.1 g/dL — ABNORMAL LOW (ref 3.5–5.0)
Anion gap: 5 (ref 5–15)
BUN: 27 mg/dL — ABNORMAL HIGH (ref 6–20)
CO2: 24 mmol/L (ref 22–32)
Calcium: 8.4 mg/dL — ABNORMAL LOW (ref 8.9–10.3)
Chloride: 110 mmol/L (ref 101–111)
Creatinine, Ser: 1.15 mg/dL (ref 0.61–1.24)
GFR calc Af Amer: >60 mL/min (ref >60)
GFR calc non Af Amer: 56 mL/min — ABNORMAL LOW (ref >60)
Glucose, Bld: 106 mg/dL — ABNORMAL HIGH (ref 65–99)
Phosphorus: 1.7 mg/dL — ABNORMAL LOW (ref 2.5–4.6)
Potassium: 4.1 mmol/L (ref 3.5–5.1)
Sodium: 139 mmol/L (ref 135–145)

## 2016-01-17 LAB — HEPATIC FUNCTION PANEL
ALT: 88 U/L — ABNORMAL HIGH (ref 17–63)
AST: 100 U/L — ABNORMAL HIGH (ref 15–41)
Albumin: 2.2 g/dL — ABNORMAL LOW (ref 3.5–5.0)
Alkaline Phosphatase: 392 U/L — ABNORMAL HIGH (ref 38–126)
Bilirubin, Direct: 0.3 mg/dL (ref 0.1–0.5)
Indirect Bilirubin: 0.8 mg/dL (ref 0.3–0.9)
Total Bilirubin: 1.1 mg/dL (ref 0.3–1.2)
Total Protein: 4.7 g/dL — ABNORMAL LOW (ref 6.5–8.1)

## 2016-01-17 LAB — CBC
HCT: 36.9 % — ABNORMAL LOW (ref 39.0–52.0)
Hemoglobin: 12.6 g/dL — ABNORMAL LOW (ref 13.0–17.0)
MCH: 30.4 pg (ref 26.0–34.0)
MCHC: 34.1 g/dL (ref 30.0–36.0)
MCV: 89.1 fL (ref 78.0–100.0)
Platelets: 57 10*3/uL — ABNORMAL LOW (ref 150–400)
RBC: 4.14 MIL/uL — ABNORMAL LOW (ref 4.22–5.81)
RDW: 14.4 % (ref 11.5–15.5)
WBC: 17 10*3/uL — ABNORMAL HIGH (ref 4.0–10.5)

## 2016-01-17 LAB — PROCALCITONIN: Procalcitonin: 21.45 ng/mL

## 2016-01-17 LAB — MAGNESIUM: Magnesium: 2 mg/dL (ref 1.7–2.4)

## 2016-01-17 MED ORDER — ACETAMINOPHEN 325 MG PO TABS
650.0000 mg | ORAL_TABLET | Freq: Four times a day (QID) | ORAL | Status: DC | PRN
  Administered 2016-01-20: 650 mg via ORAL
  Filled 2016-01-17: qty 2

## 2016-01-17 MED ORDER — INFLUENZA VAC SPLIT QUAD 0.5 ML IM SUSY
0.5000 mL | PREFILLED_SYRINGE | INTRAMUSCULAR | Status: DC
  Filled 2016-01-17: qty 0.5

## 2016-01-17 MED ORDER — PIPERACILLIN-TAZOBACTAM 3.375 G IVPB
3.3750 g | Freq: Three times a day (TID) | INTRAVENOUS | Status: DC
  Administered 2016-01-17 – 2016-01-18 (×2): 3.375 g via INTRAVENOUS
  Filled 2016-01-17 (×4): qty 50

## 2016-01-17 MED ORDER — SENNA 8.6 MG PO TABS
1.0000 | ORAL_TABLET | Freq: Every day | ORAL | Status: DC
  Administered 2016-01-17 – 2016-01-21 (×5): 8.6 mg via ORAL
  Filled 2016-01-17 (×5): qty 1

## 2016-01-17 MED ORDER — POLYETHYLENE GLYCOL 3350 17 G PO PACK
17.0000 g | PACK | Freq: Every day | ORAL | Status: DC
  Administered 2016-01-17 – 2016-01-21 (×5): 17 g via ORAL
  Filled 2016-01-17 (×5): qty 1

## 2016-01-17 NOTE — Progress Notes (Signed)
Subjective:    Some fatigue, SOB this AM  Objective:   Temp:  [97.4 F (36.3 C)-98.2 F (36.8 C)] 97.5 F (36.4 C) (10/22 0447) Pulse Rate:  [64-82] 72 (10/22 0447) Resp:  [17-23] 19 (10/22 0447) BP: (122-145)/(62-92) 130/71 (10/22 0447) SpO2:  [92 %-98 %] 95 % (10/22 0447) Weight:  [172 lb 13.5 oz (78.4 kg)] 172 lb 13.5 oz (78.4 kg) (10/22 0537) Last BM Date: 01/16/16  Filed Weights   01/15/16 0500 01/16/16 0500 01/17/16 0537  Weight: 166 lb 3.6 oz (75.4 kg) 172 lb 6.4 oz (78.2 kg) 172 lb 13.5 oz (78.4 kg)    Intake/Output Summary (Last 24 hours) at 01/17/16 0738 Last data filed at 01/16/16 1908  Gross per 24 hour  Intake           803.33 ml  Output             1775 ml  Net          -971.67 ml    Telemetry: SR  Exam:  General: NAD  HEENT: sclera clear, throat clear  Resp: coarse bilaterally  Cardiac: RRR, no m/r/g, no jvd  GI: abdomen soft, NT, ND  MSK: no LE edema  Neuro: no focal deficits  Psych: appropriate affect  Lab Results:  Basic Metabolic Panel:  Recent Labs Lab 01/15/16 0543 01/15/16 1526 01/16/16 0229 01/17/16 0525  NA 135 137 137 139  K 4.5 4.3 3.9 4.1  CL 109 113* 113* 110  CO2 18* 17* 19* 24  GLUCOSE 159* 160* 123* 106*  BUN 33* 34* 37* 27*  CREATININE 1.97* 1.75* 1.42* 1.15  CALCIUM 8.4* 7.8* 8.0* 8.4*  MG 2.1  --  2.0 2.0    Liver Function Tests:  Recent Labs Lab 01/15/16 1526 01/16/16 0229 01/17/16 0525  AST 122* 92* 100*  ALT 85* 75* 88*  ALKPHOS 137* 180* 392*  BILITOT 1.7* 1.3* 1.1  PROT 5.0* 5.0* 4.7*  ALBUMIN 2.4* 2.3* 2.2*  2.1*    CBC:  Recent Labs Lab 01/15/16 0045 01/16/16 0229 01/17/16 0525  WBC 26.4* 21.4* 17.0*  HGB 13.2 12.9* 12.6*  HCT 39.6 38.6* 36.9*  MCV 91.9 90.4 89.1  PLT 50* 53* PENDING    Cardiac Enzymes:  Recent Labs Lab 01/15/16 0543 01/15/16 1237 01/15/16 1917  TROPONINI 9.27* 8.28* 5.04*    BNP: No results for input(s): PROBNP in the last 8760  hours.  Coagulation:  Recent Labs Lab 01/14/16 1028  INR 1.21    ECG:   Medications:   Scheduled Medications: . aspirin EC  81 mg Oral Daily  . atorvastatin  80 mg Oral QPM  . cefTAZidime (FORTAZ)  IV  2 g Intravenous Q12H  . feeding supplement (ENSURE ENLIVE)  237 mL Oral BID BM  . mouth rinse  15 mL Mouth Rinse BID     Infusions: . sodium chloride Stopped (01/16/16 1845)     PRN Medications:  sodium chloride, albuterol, docusate sodium, polyvinyl alcohol     Assessment/Plan    1. NSTEMI - in setting of septic shock. Last cath 2015 with patent LIMA, patent SVG to OM with 50% narrowing, occluded SVG-RCA,ramus, and diagonal. Total occlusion of th native cicurlation. Stable from 2006.  - unclear if NSTEMI is due to severe demand in setting of chronic obstructive CAD vs. acute occlusive disease.  - peak troponin 9.27, now trending down. Echo with LVEF 40-45%, anterior and apical hypokinesis, basal mid to inferior hypokinesis.  - medically  managed at this time. Reviewed Dr Lysbeth Penner note, agree that risk stratification with noninvasive imaging once medically stable. With resolving AKI, advanced age, ongoing infection, thrombotcytopenia and clinical improvement would not pursue cath at this time.  - patient is on ASA, statin. Consider plavix once platelets improve.  No beta blocker due to history of bradycardia per prior clinic notes. Has had hypotension on ACE-Is and also recovering from AKI.   No plans for stress test today. From cardiac standpoint ok to eat.    Carlyle Dolly, M.D., F.A.C.C.Patient ID: Ronald [name], male   DOB: 05/16/1927, 82 y.o.   MRN: [id number]

## 2016-01-17 NOTE — Progress Notes (Signed)
Pharmacy Antibiotic Note  Ronald [name] is a 82 y.o. male admitted on 01/14/2016 with bacteremia and sepsis. Pharmacy has been consulted for Zosyn dosing. Patient was started on Vanc and Zosyn on admission, which was de-escalated to ceftazidime on 10/20. Blood cultures are positive for enterobacteriaceae and pseudomonas. Pseudomonas aerginosa is resistant to ceftazidime and intermediately resistant to cefepime, but sensitive to ciprofloxacin, gentamicin, imipenem, and Zosyn. Upon discussion with Dr. Quincy Simmonds, we decided to restart Zosyn. Patient remains afebrile. WBC, lactate, and procalcitonin are improving. Renal function is also improving.   Plan: Reinitiate Zosyn 3.375g IV q8h (4 hour infusion) Monitor renal function and clinical improvement Consider follow-up blood cultures   Height: 5\' 5"  (165.1 cm) Weight: 172 lb 13.5 oz (78.4 kg) IBW/kg (Calculated) : 61.5  Temp (24hrs), Avg:97.5 F (36.4 C), Min:97.4 F (36.3 C), Max:97.6 F (36.4 C)   Recent Labs Lab 01/12/16 2316  01/14/16 1028  01/14/16 1651 01/14/16 1849 01/15/16 0045 01/15/16 0543 01/15/16 0934 01/15/16 1526 01/16/16 0229 01/17/16 0525  WBC 13.2*  --  3.5*  --   --   --  26.4*  --   --   --  21.4* 17.0*  CREATININE 1.14  --  1.78*  --   --   --  1.94* 1.97*  --  1.75* 1.42* 1.15  LATICACIDVEN  --   < >  --   < > 3.3* 3.2*  --  3.4* 3.2* 2.5*  --   --   < > = values in this interval not displayed.  Estimated Creatinine Clearance: 44.5 mL/min (by C-G formula based on SCr of 1.15 mg/dL).    Allergies  Allergen Reactions  . Pantoprazole Other (See Comments)    Headache and lightheaded  . Nitrofurantoin Hives  . Sulfa Antibiotics Hives and Itching    Antimicrobials this admission: Vanc 10/19>> 10/20 Zosyn 10/19>>10/20 >> 10/22 >> Ceftaz 10/20 >> 10/22  Microbiology results:  10/19 BCx: Pseudomonas 10/19 BCID: Enterobacteriaceae species and Pseudomonas; Pseudomonas aerginosa - R to ceftaz, I to cefepime, S  to all others 10/19 UCx: mult species  10/19 MRSA PCR: neg  Thank you for allowing pharmacy to be a part of this patient's care.  Belia Heman, PharmD PGY1 Pharmacy Resident [phone] (Pager) 01/17/2016 12:32 PM

## 2016-01-17 NOTE — Progress Notes (Signed)
Social visit only:  Reviewed medical events up to today. He has presumed urosepsis. Urine culture did not show definitive pathogens but blood cultures positive for both Enterobacter and Pseudomonas. Clinically improving. Suprapubic tube has been change. Nothing else to do urologically at this time. He will keep his normal [facility] follow-up after discharge.

## 2016-01-17 NOTE — Progress Notes (Addendum)
PROGRESS NOTE [facility]   Ronald [name]   GMW:[id number] DOB: 1927/05/22  DOA: 01/14/2016 PCP: Nyoka Cowden, MD   Brief Narrative:  82 y/o M, former smoker, with PMH of arthritis, Bell's palsy, GERD, diverticulosis, CAD s/p CABG / stents (LVEF 45%), HTN, HLD, overactive bladder, prostate cancer, urethral stricture s/p chronic indwelling catheter with frequent UTI's and recent ER visit for suspected UTI (01/12/2016 - was treated with keflex, cultures grew multiple species) who presented to [facility] on 10/19 with altered mental status and abdominal pain. She was admitted to the ICU with hypotension and altered mental status concerning for septic shock secondary to urosepsis. She was started on pressors and IV antibiotics urology was consulted, suprapubic catheter was changed. Also cardiology was consulted due to elevation in troponin up to 9.7, now trending down. Cardiology deemed that might be due to demand ischemia versus STEMI but no cardiac cath needed at this time. Ultrasound was done for abdominal pain, which showed ? possible pancreatic duct dilation with ? mild intrahepatic biliary duct dilation, patient treated conservatively. Transferred to [facility] 10[facility]22 to continue management.   Subjective: Patient seen and examined with complaining that he is hungry and wants to eat. No other complaints, no acute events overnight   Assessment & Plan:  Septic Shock - Resolved - secondary to Urosepsis Blood culture + for Pseudomonas and Enterobacter  -Continue antibiotics, Pharm recs appreciated  -Urology recs appreciated  -Cont IVF   Hx CAD, ICM (EF 35-40%), HTN (reportedly runs low, on imdur at baseline), HLD, CABG Elevated troponin, suspect NSTEMI - 4 > 4.33 > 7.7 > 9.27  Small Bilateral Pleural Effusions - noted on admit, hx of CHF (EF 45-50%) - Improved pulmonary edema on CXR 10/20 - stable  O2 to support sats > 90% mobilize  Intermittent CXR  Cardiology consulted,  appreciate recommendations   AKI - in setting of suspected septic shock - improving  Hx Prostate CA, overactive bladder, urethral stricture, chronic cystitis & suprapubic catheter IVF  Trend BMP / UOP  Replace electrolytes as indicated   Abdominal Pain - Improving - LFT trending down  Nausea / Vomiting Elevated T-Bili, Alk Phos; AST/ALT Hx GERD, Diverticulosis Pancreatic duct prominence on [place], s/p cholecystectomy Will hold on the MRCP for now  Patient wants to eat will feed him liquid diet, if abdominal pain recurs will plan for MRCP in AM   Anemia  Thrombocytopenia - noted on lab review since 09/2015; Worsening 123 > 50   DVT prophylaxis: Lovenox  Code Status: Full Family Communication: Family at bedside, plan of care discussed  Disposition Plan: d/c home when medically stable    Consultants:   Urology   Cardiology   Procedures:   ECHO   Antimicrobials:  Zosyn    Objective: Vitals:   01/16/16 1445 01/16/16 2136 01/17/16 0447 01/17/16 0537  BP: 133/65 (!) 145/73 130/71   Pulse: 69 72 72   Resp: 18 20 19    Temp: 97.6 F (36.4 C) 97.6 F (36.4 C) 97.5 F (36.4 C)   TempSrc: Oral Oral    SpO2: 96% 95% 95%   Weight:    78.4 kg (172 lb 13.5 oz)  Height:        Intake/Output Summary (Last 24 hours) at 01/17/16 1034 Last data filed at 01/17/16 0900  Gross per 24 hour  Intake           553.33 ml  Output  3125 ml  Net         -2571.67 ml   Filed Weights   01/15/16 0500 01/16/16 0500 01/17/16 0537  Weight: 75.4 kg (166 lb 3.6 oz) 78.2 kg (172 lb 6.4 oz) 78.4 kg (172 lb 13.5 oz)    Examination:  General exam: Appears calm and comfortable  Respiratory system: Clear to auscultation. No wheezes,crackle or rhonchi Cardiovascular system: S1 & S2 heard, RRR. No JVD, murmurs, rubs or gallops Gastrointestinal system: Abdomen is nondistended, soft and nontender. No organomegaly or masses felt.  Central nervous system: Alert and oriented. No focal  neurological deficits. Extremities: No pedal edema. Symmetric, strength 5/5   Skin: No rashes, lesions or ulcers Psychiatry: Judgement and insight appear normal. Mood & affect appropriate.    Data Reviewed: I have personally reviewed following labs and imaging studies  CBC:  Recent Labs Lab 01/12/16 2316 01/14/16 1028 01/15/16 0045 01/16/16 0229 01/17/16 0525  WBC 13.2* 3.5* 26.4* 21.4* 17.0*  NEUTROABS 11.0* 3.4  --   --   --   HGB 14.2 15.3 13.2 12.9* 12.6*  HCT 43.0 45.0 39.6 38.6* 36.9*  MCV 91.3 90.9 91.9 90.4 89.1  PLT 123* 55* 50* 53* 57*   Basic Metabolic Panel:  Recent Labs Lab 01/14/16 1651 01/15/16 0045 01/15/16 0543 01/15/16 1526 01/16/16 0229 01/17/16 0525  NA  --  137 135 137 137 139  K  --  3.9 4.5 4.3 3.9 4.1  CL  --  110 109 113* 113* 110  CO2  --  18* 18* 17* 19* 24  GLUCOSE  --  176* 159* 160* 123* 106*  BUN  --  32* 33* 34* 37* 27*  CREATININE  --  1.94* 1.97* 1.75* 1.42* 1.15  CALCIUM  --  8.4* 8.4* 7.8* 8.0* 8.4*  MG 1.6*  --  2.1  --  2.0 2.0  PHOS 2.2*  --   --   --  2.4* 1.7*   GFR: Estimated Creatinine Clearance: 44.5 mL/min (by C-G formula based on SCr of 1.15 mg/dL). Liver Function Tests:  Recent Labs Lab 01/14/16 1028 01/15/16 0045 01/15/16 1526 01/16/16 0229 01/17/16 0525  AST 84* 138* 122* 92* 100*  ALT 53 87* 85* 75* 88*  ALKPHOS 187* 134* 137* 180* 392*  BILITOT 2.3* 2.3* 1.7* 1.3* 1.1  PROT 6.7 5.2* 5.0* 5.0* 4.7*  ALBUMIN 3.6 2.6* 2.4* 2.3* 2.2*  2.1*    Recent Labs Lab 01/14/16 1651  LIPASE 17  AMYLASE 36   No results for input(s): AMMONIA in the last 168 hours. Coagulation Profile:  Recent Labs Lab 01/14/16 1028  INR 1.21   Cardiac Enzymes:  Recent Labs Lab 01/14/16 1849 01/15/16 0045 01/15/16 0543 01/15/16 1237 01/15/16 1917  TROPONINI 4.33* 7.70* 9.27* 8.28* 5.04*   BNP (last 3 results) No results for input(s): PROBNP in the last 8760 hours. HbA1C: No results for input(s): HGBA1C in the  last 72 hours. CBG:  Recent Labs Lab 01/14/16 1018 01/14/16 1634  GLUCAP 138* 159*   Lipid Profile: No results for input(s): CHOL, HDL, LDLCALC, TRIG, CHOLHDL, LDLDIRECT in the last 72 hours. Thyroid Function Tests: No results for input(s): TSH, T4TOTAL, FREET4, T3FREE, THYROIDAB in the last 72 hours. Anemia Panel: No results for input(s): VITAMINB12, FOLATE, FERRITIN, TIBC, IRON, RETICCTPCT in the last 72 hours. Sepsis Labs:  Recent Labs Lab 01/14/16 1028  01/14/16 1849 01/15/16 0543 01/15/16 0934 01/15/16 1119 01/15/16 1526 01/16/16 0229 01/17/16 0525  PROCALCITON 3.12  --   --   --   --  79.67  --  39.79 21.45  LATICACIDVEN  --   < > 3.2* 3.4* 3.2*  --  2.5*  --   --   < > = values in this interval not displayed.  Recent Results (from the past 240 hour(s))  Urine culture     Status: Abnormal   Collection Time: 01/13/16 12:15 AM  Result Value Ref Range Status   Specimen Description URINE, RANDOM  Final   Special Requests NONE  Final   Culture MULTIPLE SPECIES PRESENT, SUGGEST RECOLLECTION (A)  Final   Report Status 01/14/2016 FINAL  Final  Blood Culture (routine x 2)     Status: None (Preliminary result)   Collection Time: 01/14/16 10:20 AM  Result Value Ref Range Status   Specimen Description BLOOD RIGHT ANTECUBITAL  Final   Special Requests BOTTLES DRAWN AEROBIC AND ANAEROBIC  5CC  Final   Culture  Setup Time   Final    GRAM NEGATIVE RODS IN BOTH AEROBIC AND ANAEROBIC BOTTLES CRITICAL RESULT CALLED TO, READ BACK BY AND VERIFIED WITH: KIM HAMMONS,PHARMD @0049  01/15/16 MKELLY,MLT    Culture GRAM NEGATIVE RODS PSEUDOMONAS AERUGINOSA   Final   Report Status PENDING  Incomplete   Organism ID, Bacteria PSEUDOMONAS AERUGINOSA  Final      Susceptibility   Pseudomonas aeruginosa - MIC*    CEFTAZIDIME >=64 RESISTANT Resistant     CIPROFLOXACIN <=0.25 SENSITIVE Sensitive     GENTAMICIN <=1 SENSITIVE Sensitive     IMIPENEM 2 SENSITIVE Sensitive     PIP/TAZO 64  SENSITIVE Sensitive     CEFEPIME 2 INTERMEDIATE Intermediate     * PSEUDOMONAS AERUGINOSA  Blood Culture (routine x 2)     Status: None (Preliminary result)   Collection Time: 01/14/16 10:47 AM  Result Value Ref Range Status   Specimen Description BLOOD RIGHT HAND  Final   Special Requests BOTTLES DRAWN AEROBIC AND ANAEROBIC  5CC  Final   Culture  Setup Time   Final    GRAM NEGATIVE RODS IN BOTH AEROBIC AND ANAEROBIC BOTTLES CRITICAL RESULT CALLED TO, READ BACK BY AND VERIFIED WITH: KIMBERELY HAMMONS,PHARMD @0049  01/15/16 MKELLY,MLT    Culture   Final    GRAM NEGATIVE RODS PSEUDOMONAS AERUGINOSA SUSCEPTIBILITIES PERFORMED ON PREVIOUS CULTURE WITHIN THE LAST 5 DAYS.    Report Status PENDING  Incomplete  Blood Culture ID Panel (Reflexed)     Status: Abnormal   Collection Time: 01/14/16 10:47 AM  Result Value Ref Range Status   Enterococcus species NOT DETECTED NOT DETECTED Final   Listeria monocytogenes NOT DETECTED NOT DETECTED Final   Staphylococcus species NOT DETECTED NOT DETECTED Final   Staphylococcus aureus NOT DETECTED NOT DETECTED Final   Streptococcus species NOT DETECTED NOT DETECTED Final   Streptococcus agalactiae NOT DETECTED NOT DETECTED Final   Streptococcus pneumoniae NOT DETECTED NOT DETECTED Final   Streptococcus pyogenes NOT DETECTED NOT DETECTED Final   Acinetobacter baumannii NOT DETECTED NOT DETECTED Final   Enterobacteriaceae species DETECTED (A) NOT DETECTED Final    Comment: CRITICAL RESULT CALLED TO, READ BACK BY AND VERIFIED WITH: KIM HAMMONS,PHARMD @0049  01/15/16 MKELLY,MLT    Enterobacter cloacae complex NOT DETECTED NOT DETECTED Final   Escherichia coli NOT DETECTED NOT DETECTED Final   Klebsiella oxytoca NOT DETECTED NOT DETECTED Final   Klebsiella pneumoniae NOT DETECTED NOT DETECTED Final   Proteus species NOT DETECTED NOT DETECTED Final   Serratia marcescens NOT DETECTED NOT DETECTED Final   Carbapenem resistance NOT DETECTED NOT DETECTED  Final  Haemophilus influenzae NOT DETECTED NOT DETECTED Final   Neisseria meningitidis NOT DETECTED NOT DETECTED Final   Pseudomonas aeruginosa DETECTED (A) NOT DETECTED Final    Comment: CRITICAL RESULT CALLED TO, READ BACK BY AND VERIFIED WITH: KIM HAMMONS,PHARMD @0049  01/15/16 MKELLY,MLT    Candida albicans NOT DETECTED NOT DETECTED Final   Candida glabrata NOT DETECTED NOT DETECTED Final   Candida krusei NOT DETECTED NOT DETECTED Final   Candida parapsilosis NOT DETECTED NOT DETECTED Final   Candida tropicalis NOT DETECTED NOT DETECTED Final  Urine culture     Status: Abnormal   Collection Time: 01/14/16 11:18 AM  Result Value Ref Range Status   Specimen Description URINE, RANDOM  Final   Special Requests NONE  Final   Culture MULTIPLE SPECIES PRESENT, SUGGEST RECOLLECTION (A)  Final   Report Status 01/15/2016 FINAL  Final  MRSA PCR Screening     Status: None   Collection Time: 01/14/16  4:51 PM  Result Value Ref Range Status   MRSA by PCR NEGATIVE NEGATIVE Final    Comment:        The GeneXpert MRSA Assay (FDA approved for NASAL specimens only), is one component of a comprehensive MRSA colonization surveillance program. It is not intended to diagnose MRSA infection nor to guide or monitor treatment for MRSA infections.       Radiology Studies: No results found.    Scheduled Meds: . aspirin EC  81 mg Oral Daily  . atorvastatin  80 mg Oral QPM  . cefTAZidime (FORTAZ)  IV  2 g Intravenous Q12H  . feeding supplement (ENSURE ENLIVE)  237 mL Oral BID BM  . mouth rinse  15 mL Mouth Rinse BID  . polyethylene glycol  17 g Oral Daily  . senna  1 tablet Oral Daily   Continuous Infusions: . sodium chloride Stopped (01/16/16 1845)     LOS: 3 days    Chipper Oman, MD [facility] Pager [phone]  If 7PM-7AM, please contact night-coverage [email or web address] Password [facility] 01/17/2016, 10:34 AM

## 2016-01-18 DIAGNOSIS — R7881 Bacteremia: Secondary | ICD-10-CM

## 2016-01-18 LAB — BASIC METABOLIC PANEL
Anion gap: 8 (ref 5–15)
BUN: 21 mg/dL — ABNORMAL HIGH (ref 6–20)
CO2: 23 mmol/L (ref 22–32)
Calcium: 8.6 mg/dL — ABNORMAL LOW (ref 8.9–10.3)
Chloride: 107 mmol/L (ref 101–111)
Creatinine, Ser: 0.92 mg/dL (ref 0.61–1.24)
GFR calc Af Amer: >60 mL/min (ref >60)
GFR calc non Af Amer: >60 mL/min (ref >60)
Glucose, Bld: 120 mg/dL — ABNORMAL HIGH (ref 65–99)
Potassium: 3.6 mmol/L (ref 3.5–5.1)
Sodium: 138 mmol/L (ref 135–145)

## 2016-01-18 LAB — ALBUMIN: Albumin: 2.3 g/dL — ABNORMAL LOW (ref 3.5–5.0)

## 2016-01-18 LAB — CULTURE, BLOOD (ROUTINE X 2)

## 2016-01-18 LAB — CBC WITH DIFFERENTIAL/PLATELET
Basophils Absolute: 0 10*3/uL (ref 0.0–0.1)
Basophils Relative: 0 %
Eosinophils Absolute: 0.2 10*3/uL (ref 0.0–0.7)
Eosinophils Relative: 1 %
HCT: 39.1 % (ref 39.0–52.0)
Hemoglobin: 13.4 g/dL (ref 13.0–17.0)
Lymphocytes Relative: 7 %
Lymphs Abs: 1 10*3/uL (ref 0.7–4.0)
MCH: 30.5 pg (ref 26.0–34.0)
MCHC: 34.3 g/dL (ref 30.0–36.0)
MCV: 89.1 fL (ref 78.0–100.0)
Monocytes Absolute: 0.9 10*3/uL (ref 0.1–1.0)
Monocytes Relative: 6 %
Neutro Abs: 11.6 10*3/uL — ABNORMAL HIGH (ref 1.7–7.7)
Neutrophils Relative %: 85 %
Platelets: 77 10*3/uL — ABNORMAL LOW (ref 150–400)
RBC: 4.39 MIL/uL (ref 4.22–5.81)
RDW: 14.3 % (ref 11.5–15.5)
WBC: 13.6 10*3/uL — ABNORMAL HIGH (ref 4.0–10.5)

## 2016-01-18 LAB — TROPONIN I: Troponin I: 2.23 ng/mL (ref <0.03)

## 2016-01-18 LAB — MAGNESIUM: Magnesium: 1.9 mg/dL (ref 1.7–2.4)

## 2016-01-18 LAB — PHOSPHORUS: Phosphorus: 2.3 mg/dL — ABNORMAL LOW (ref 2.5–4.6)

## 2016-01-18 MED ORDER — CIPROFLOXACIN HCL 500 MG PO TABS
500.0000 mg | ORAL_TABLET | Freq: Two times a day (BID) | ORAL | Status: DC
  Administered 2016-01-18 – 2016-01-21 (×7): 500 mg via ORAL
  Filled 2016-01-18 (×7): qty 1

## 2016-01-18 MED ORDER — POTASSIUM CHLORIDE CRYS ER 20 MEQ PO TBCR
40.0000 meq | EXTENDED_RELEASE_TABLET | Freq: Once | ORAL | Status: AC
  Administered 2016-01-18: 40 meq via ORAL
  Filled 2016-01-18: qty 2

## 2016-01-18 NOTE — Care Management Important Message (Signed)
Important Message  Patient Details  Name: Ronald [name] MRN: [id number] Date of Birth: 05-31-1927   Medicare Important Message Given:  Yes    [name] 01/18/2016, 4:09 PM

## 2016-01-18 NOTE — Progress Notes (Signed)
Patient Name: Ronald [name] Date of Encounter: 01/18/2016  Primary Cardiologist: Dr. Shon Millet Problem List     Principal Problem:   Septic shock [facility]) Active Problems:   HTN (hypertension)   Chronic systolic CHF (congestive heart failure) (HCC)   Sepsis ([place])   Altered mental state   NSTEMI (non-ST elevated myocardial infarction) ([place])   Abdominal pain     Subjective   Feels well today, no chest pain or SOB.   Inpatient Medications    Scheduled Meds: . aspirin EC  81 mg Oral Daily  . atorvastatin  80 mg Oral QPM  . ciprofloxacin  500 mg Oral BID  . feeding supplement (ENSURE ENLIVE)  237 mL Oral BID BM  . Influenza vac split quadrivalent PF  0.5 mL Intramuscular Tomorrow-1000  . mouth rinse  15 mL Mouth Rinse BID  . polyethylene glycol  17 g Oral Daily  . senna  1 tablet Oral Daily   Continuous Infusions: . sodium chloride Stopped (01/16/16 1845)   PRN Meds: sodium chloride, acetaminophen, albuterol, docusate sodium, polyvinyl alcohol   Vital Signs    Vitals:   01/17/16 0537 01/17/16 1457 01/17/16 2228 01/18/16 0526  BP:  (!) 144/63 (!) 139/54 (!) 144/61  Pulse:  64 60 (!) 58  Resp:  18 17 17   Temp:  98.2 F (36.8 C) 98.1 F (36.7 C) 98.2 F (36.8 C)  TempSrc:  Oral Oral Oral  SpO2:  96% 97% 96%  Weight: 172 lb 13.5 oz (78.4 kg)   168 lb 6.9 oz (76.4 kg)  Height:        Intake/Output Summary (Last 24 hours) at 01/18/16 1029 Last data filed at 01/18/16 0337  Gross per 24 hour  Intake              560 ml  Output             2950 ml  Net            -2390 ml   Filed Weights   01/16/16 0500 01/17/16 0537 01/18/16 0526  Weight: 172 lb 6.4 oz (78.2 kg) 172 lb 13.5 oz (78.4 kg) 168 lb 6.9 oz (76.4 kg)    Physical Exam   GEN: Well nourished, well developed, in no acute distress.  HEENT: Poor dentition Neck: Supple, no JVD, carotid bruits, or masses. Cardiac: RRR, no murmurs, rubs, or gallops. No clubbing, cyanosis, edema.  Radials/DP/PT  2+ and equal bilaterally.  Respiratory:  Respirations regular and unlabored, clear to auscultation bilaterally. GI: Soft, nontender, nondistended, BS + x 4. MS: no deformity or atrophy. Skin: warm and dry, no rash. Neuro:  Strength and sensation are intact. Psych: AAOx3.  Normal affect.  Labs    CBC  Recent Labs  01/17/16 0525 01/18/16 0059  WBC 17.0* 13.6*  NEUTROABS  --  11.6*  HGB 12.6* 13.4  HCT 36.9* 39.1  MCV 89.1 89.1  PLT 57* 77*   Basic Metabolic Panel  Recent Labs  01/17/16 0525 01/18/16 0059  NA 139 138  K 4.1 3.6  CL 110 107  CO2 24 23  GLUCOSE 106* 120*  BUN 27* 21*  CREATININE 1.15 0.92  CALCIUM 8.4* 8.6*  MG 2.0 1.9  PHOS 1.7* 2.3*   Liver Function Tests  Recent Labs  01/16/16 0229 01/17/16 0525 01/18/16 0059  AST 92* 100*  --   ALT 75* 88*  --   ALKPHOS 180* 392*  --   BILITOT 1.3* 1.1  --  PROT 5.0* 4.7*  --   ALBUMIN 2.3* 2.2*  2.1* 2.3*   Cardiac Enzymes  Recent Labs  01/15/16 1237 01/15/16 1917 01/18/16 0059  TROPONINI 8.28* 5.04* 2.23*     Telemetry     NSR- Personally Reviewed  ECG     Sinus tachycardia, PVCs, no acute change. Personally Reviewed  Radiology    No results found.  Cardiac Studies  Transthoracic Echocardiography 01/15/16 Study Conclusions  - Procedure narrative: Transthoracic echocardiography. Image   quality was poor. The study was technically difficult, as a   result of poor patient compliance and body habitus. Intravenous   contrast (Definity) was administered. - Left ventricle: The cavity size was normal. Wall thickness was   increased in a pattern of severe LVH. Systolic function was   mildly to moderately reduced. The estimated ejection fraction was   in the range of 40% to 45%. Anterior and apical hypokinesis, as   well as basal to mid inferior wall hypokinesis. Diastolic   dysfunciton with indeterminate LV filling pressure. The study is   not technically sufficient to allow  evaluation of LV diastolic   function. - Aorta: Aortic root dimension: 41 mm (ED). - Aortic root: The aortic root is dilated. - Mitral valve: Mildly thickened leaflets . There was mild   regurgitation. - Left atrium: The atrium was mildly dilated. - Tricuspid valve: There was mild regurgitation. - Pulmonary arteries: PA peak pressure: 33 mm Hg (S). - Inferior vena cava: The vessel was dilated. The respirophasic   diameter changes were blunted (< 50%), consistent with elevated   central venous pressure.  Impressions:  - Compared to a prior report in 2015, the LVEF is lower at 40-45%   with anterior, apical and basal to mid inferior wall hypokinesis,   the ascending aorta is dilated to 4.1 cm.   Cath 08/28/13  BYPASS GRAFT ANGIOGRAPHY:  The saphenous vein graft to the obtuse marginal is widely patent there is proximal segmental 30-50% narrowing. The obtuse marginal territory supplies collaterals to the distal right coronary.  The saphenous vein graft to the right coronary is totally occluded  The saphenous vein graft to the ramus/diagonal is totally occluded  The left internal mammary graft to the LAD and diagonal is widely patent  IMPRESSIONS:  1. Widely patent sequential left internal graft to the diagonal/LAD.  2. Widely patent saphenous vein graft to the obtuse marginal with proximal 50% narrowing noted in the graft.  3. Total occlusion of the saphenous vein grafts to the right coronary, the ramus intermedius, and the diagonal.  4. Left ventricular dysfunction with inferobasal aneurysm and mid anterior wall region of akinesis. Overall EF is 35-40% .  5. Total occlusion of the native circulation.   RECOMMENDATION:  Nearly continuous chest discomfort unrelieved by nitroglycerin seems unlikely to been ischemic and was possibly gastrointestinal in origin. Anatomy is is not significantly changed compared to the 2006 catheterization performed by Dr.  Doreatha Lew.   Patient Profile     Ronald [name] is a 82 year old male with a past medical history of arthritis, Bell's palsy, GERD, diverticulosis, CAD s/p CABG x 2,(LVEF 45%), HTN, HLD, overactive bladder, prostate cancer, urethral stricture s/p chronic indwelling catheter with frequent UTI's and recent ER visit for suspected UTI (01/12/2016 - was treated with keflex, cultures grew multiple species) who presented to [facility] on 10/19 with altered mental status and abdominal pain, treated for urosepsis, had elevated troponin of 4.06>>7.70>>9.27>>8.28>>5.04.   Assessment & Plan   1.  NSTEMI: Elevated troponin in the setting of urosepsis and acute renal failure. Patient with known CAD and occluded grafts to RCA and intermediate vessels. He is asymptomatic. Ecg is unchanged. Echo is consistent with prior Echo and cardiac cath findings. Cath was deferred due to acute renal failure and thrombocytopenia. Given recent events with ARF and thrombocytopenia and advanced age and comorbidities I do not recommend further cardiac testing at this time. Stress testing is likely to be abnormal based on known disease. Cardiac cath is not indicated based solely on elevated troponin since he is asymptomatic. Would continue medical therapy and have patient follow up with Dr. Angelena Form as an outpatient. I would not add Plavix at this point with thrombocytopenia.   2. CAD s/p CABG x 2  3. Septic Shock, bacteremia: management per primary team.    Signed, Arbutus Leas, NP  01/18/2016, 10:29 AM   Patient seen and examined and history reviewed. Agree with above findings and plan. I have amended assessment and plans with my recommendations. Unless patient develops symptoms of angina I would not pursue further cardiac testing at this time. Currently on ASA and lipitor. Not a candidate for beta blocker with history of bradycardia. We will sign off. Please call with questions.  Nai Borromeo [place], [place] 01/18/2016 12:01 PM

## 2016-01-18 NOTE — Progress Notes (Signed)
Pt had 6 beat run of Vtach at 0344. No c/o of chest pain or discomfort. Raliegh Ip Schorr notified. Will continue to monitor.

## 2016-01-18 NOTE — Progress Notes (Signed)
Nutrition Follow-up  DOCUMENTATION CODES:   Not applicable  INTERVENTION:   -Continue Ensure Enlive po BID, each supplement provides 350 kcal and 20 grams of protein  NUTRITION DIAGNOSIS:   Increased nutrient needs related to acute illness as evidenced by estimated needs.  Ongoing  GOAL:   Patient will meet greater than or equal to 90% of their needs  Progressing  MONITOR:   PO intake, Supplement acceptance, Labs, Weight trends, Skin, I & O's  REASON FOR ASSESSMENT:   Malnutrition Screening Tool    ASSESSMENT:   82 year old with a past medical history of arthritis, Bell's palsy, GERD, diverticulosis, CAD s/p CABG x 2,(LVEF 45%), HTN, HLD, overactive bladder, prostate cancer, urethral stricture s/p chronic indwelling catheter with frequent UTI's and recent ER visit for suspected UTI (01/12/2016 - was treated with keflex, cultures grew multiple species) who presented to [facility] on 10/19 with altered mental status and abdominal pain, has elevated troponin at 9.27  Spoke with pt and pt wife at bedside.   Per pt wife, pt had an extremely good appetite up until a few days ago. Pt typically consumes 3 meals per day and snacks frequently in between meals. However, due to some abdominal pain and liquid diet, pt has not been eating as much. Pt wife estimates that he has been consuming about 50% of meals. He requested steak earlier this morning, however, pt wife doubts that pt would consume more if diet was advanced.   Pt wife denies any wt loss, reports UBW is around 165#.   Pt reports he likes Ensure supplements and amenable to continue consuming them. Discussed importance of good meal and supplement intake to support healing.   Nutrition-Focused physical exam completed. Findings are mild fat depletion, mild muscle depletion, and mild edema. Pt was active PTA and making good progress with home health PT. Suspect fat and muscle depletion is related to advanced age.   Labs reviewed:  Phos: 2.3.   Diet Order:  Diet full liquid Room service appropriate? Yes; Fluid consistency: Thin  Skin:  Reviewed, no issues  Last BM:  01/17/16  Height:   Ht Readings from Last 1 Encounters:  01/14/16 5\' 5"  (1.651 m)    Weight:   Wt Readings from Last 1 Encounters:  01/18/16 168 lb 6.9 oz (76.4 kg)    Ideal Body Weight:  61.8 kg  BMI:  Body mass index is 28.03 kg/m.  Estimated Nutritional Needs:   Kcal:  1850-2000  Protein:  80-90 grams  Fluid:  1.8 - 2 L/day  EDUCATION NEEDS:   No education needs identified at this time  Majorie Santee A. Jimmye Norman, RD, LDN, CDE Pager: [phone] After hours Pager: [phone]

## 2016-01-18 NOTE — Progress Notes (Signed)
PROGRESS NOTE [facility]   Ronald [name]   WJX:[id number] DOB: 08-02-1927  DOA: 01/14/2016 PCP: Nyoka Cowden, MD   Brief Narrative:  82 y/o M, former smoker, with PMH of arthritis, Bell's palsy, GERD, diverticulosis, CAD s/p CABG / stents (LVEF 45%), HTN, HLD, overactive bladder, prostate cancer, urethral stricture s/p chronic indwelling catheter with frequent UTI's and recent ER visit for suspected UTI (01/12/2016 - was treated with keflex, cultures grew multiple species) who presented to [facility] on 10/19 with altered mental status and abdominal pain. He was admitted to the ICU with hypotension and confusion concerning for septic shock secondary to urosepsis. He was started on pressors and IV antibiotics urology was consulted, suprapubic catheter was changed. Also cardiology was consulted due to elevation in troponin up to 9.7, now trending down. Cardiology deemed that might be due to demand ischemia versus STEMI but no cardiac cath needed at this time. Ultrasound was done for abdominal pain, which showed ? possible pancreatic duct dilation with ? mild intrahepatic biliary duct dilation, patient treated conservatively. [facility]red to [facility] 10[facility]22 to continue management.   Subjective: Patient seen and examined with complaining that he is hungry and wants to eat. No other complaints, no acute events overnight   Assessment & Plan:  Septic Shock - Resolved - secondary to Urosepsis Blood culture + for Pseudomonas and Citrobacter, related to chronic suprapubic catheter  -Continue antibiotics, Pharm recs appreciated - switched to cipro - both bacteria sensitive to quinolone  - Citrobacter resistant to Zosyn  -Urology recs appreciated  -d/c IVF  -Repeat blood cultures   Hx CAD, ICM (EF 35-40%), HTN (reportedly runs low, on imdur at baseline), HLD, CABG Elevated troponin, suspect NSTEMI - 4 > 4.33 > 7.7 > 9.27  Small Bilateral Pleural Effusions - noted on admit, hx of CHF (EF  45-50%) - Improved pulmonary edema on CXR 10/20 - stable  O2 to support sats > 90% mobilize  Intermittent CXR  Cardiology consulted, appreciate recommendations - refer to their note   AKI - in setting of suspected septic shock - Resolved  Hx Prostate CA, overactive bladder, urethral stricture, chronic cystitis & suprapubic catheter D/c ivf  Trend BMP / UOP  Replace electrolytes as indicated   Abdominal Pain - Asymptomatic - LFT trending down  Elevated T-Bili, Alk Phos; AST/ALT Hx GERD, Diverticulosis Pancreatic duct prominence on [place], s/p cholecystectomy Will hold on the MRCP for now  Advance to full diet   Anemia likely due to chronic diseases - resolved  Hb 13.4 Thrombocytopenia - Improving poss due to sepsis 53 --> 73  DVT prophylaxis: Lovenox  Code Status: Full Family Communication: Family at bedside, plan of care discussed  Disposition Plan: d/c home when medically stable    Consultants:   Urology   Cardiology   Procedures:   ECHO   Antimicrobials:  Zosyn    Objective: Vitals:   01/17/16 0537 01/17/16 1457 01/17/16 2228 01/18/16 0526  BP:  (!) 144/63 (!) 139/54 (!) 144/61  Pulse:  64 60 (!) 58  Resp:  _0 Temp:  98.2 F (36.8 C) 98.1 F (36.7 C) 98.2 F (36.8 C)  TempSrc:  Oral Oral Oral  SpO2:  96% 97% 96%  Weight: 78.4 kg (172 lb 13.5 oz)   76.4 kg (168 lb 6.9 oz)  Height:        Intake/Output Summary (Last 24 hours) at 01/18/16 0907 Last data filed at 01/18/16 0337  Gross per 24 hour  Intake  560 ml  Output             2950 ml  Net            -2390 ml   Filed Weights   01/16/16 0500 01/17/16 0537 01/18/16 0526  Weight: 78.2 kg (172 lb 6.4 oz) 78.4 kg (172 lb 13.5 oz) 76.4 kg (168 lb 6.9 oz)    Examination:  General exam: Appears calm and comfortable  Respiratory system: Clear to auscultation. No wheezes,crackle or rhonchi Cardiovascular system: S1 & S2 heard, RRR. No JVD, murmurs, rubs or gallops Gastrointestinal  system: Abdomen is nondistended, soft and nontender. No organomegaly or masses felt. Suprapubic cath in place  Central nervous system: Alert and oriented. No focal neurological deficits. Extremities: No pedal edema. Skin: No rashes, lesions or ulcers   Data Reviewed: I have personally reviewed following labs and imaging studies  CBC:  Recent Labs Lab 01/12/16 2316 01/14/16 1028 01/15/16 0045 01/16/16 0229 01/17/16 0525 01/18/16 0059  WBC 13.2* 3.5* 26.4* 21.4* 17.0* 13.6*  NEUTROABS 11.0* 3.4  --   --   --  11.6*  HGB 14.2 15.3 13.2 12.9* 12.6* 13.4  HCT 43.0 45.0 39.6 38.6* 36.9* 39.1  MCV 91.3 90.9 91.9 90.4 89.1 89.1  PLT 123* 55* 50* 53* 57* 77*   Basic Metabolic Panel:  Recent Labs Lab 01/14/16 1651  01/15/16 0543 01/15/16 1526 01/16/16 0229 01/17/16 0525 01/18/16 0059  NA  --   < > 135 137 137 139 138  K  --   < > 4.5 4.3 3.9 4.1 3.6  CL  --   < > 109 113* 113* 110 107  CO2  --   < > 18* 17* 19* 24 23  GLUCOSE  --   < > 159* 160* 123* 106* 120*  BUN  --   < > 33* 34* 37* 27* 21*  CREATININE  --   < > 1.97* 1.75* 1.42* 1.15 0.92  CALCIUM  --   < > 8.4* 7.8* 8.0* 8.4* 8.6*  MG 1.6*  --  2.1  --  2.0 2.0 1.9  PHOS 2.2*  --   --   --  2.4* 1.7* 2.3*  < > = values in this interval not displayed. GFR: Estimated Creatinine Clearance: 55 mL/min (by C-G formula based on SCr of 0.92 mg/dL). Liver Function Tests:  Recent Labs Lab 01/14/16 1028 01/15/16 0045 01/15/16 1526 01/16/16 0229 01/17/16 0525 01/18/16 0059  AST 84* 138* 122* 92* 100*  --   ALT 53 87* 85* 75* 88*  --   ALKPHOS 187* 134* 137* 180* 392*  --   BILITOT 2.3* 2.3* 1.7* 1.3* 1.1  --   PROT 6.7 5.2* 5.0* 5.0* 4.7*  --   ALBUMIN 3.6 2.6* 2.4* 2.3* 2.2*  2.1* 2.3*    Recent Labs Lab 01/14/16 1651  LIPASE 17  AMYLASE 36   No results for input(s): AMMONIA in the last 168 hours. Coagulation Profile:  Recent Labs Lab 01/14/16 1028  INR 1.21   Cardiac Enzymes:  Recent Labs Lab  01/15/16 0045 01/15/16 0543 01/15/16 1237 01/15/16 1917 01/18/16 0059  TROPONINI 7.70* 9.27* 8.28* 5.04* 2.23*   BNP (last 3 results) No results for input(s): PROBNP in the last 8760 hours. HbA1C: No results for input(s): HGBA1C in the last 72 hours. CBG:  Recent Labs Lab 01/14/16 1018 01/14/16 1634  GLUCAP 138* 159*   Lipid Profile: No results for input(s): CHOL, HDL, LDLCALC, TRIG, CHOLHDL, LDLDIRECT in the  last 72 hours. Thyroid Function Tests: No results for input(s): TSH, T4TOTAL, FREET4, T3FREE, THYROIDAB in the last 72 hours. Anemia Panel: No results for input(s): VITAMINB12, FOLATE, FERRITIN, TIBC, IRON, RETICCTPCT in the last 72 hours. Sepsis Labs:  Recent Labs Lab 01/14/16 1028  01/14/16 1849 01/15/16 0543 01/15/16 0934 01/15/16 1119 01/15/16 1526 01/16/16 0229 01/17/16 0525  PROCALCITON 3.12  --   --   --   --  79.67  --  39.79 21.45  LATICACIDVEN  --   < > 3.2* 3.4* 3.2*  --  2.5*  --   --   < > = values in this interval not displayed.  Recent Results (from the past 240 hour(s))  Urine culture     Status: Abnormal   Collection Time: 01/13/16 12:15 AM  Result Value Ref Range Status   Specimen Description URINE, RANDOM  Final   Special Requests NONE  Final   Culture MULTIPLE SPECIES PRESENT, SUGGEST RECOLLECTION (A)  Final   Report Status 01/14/2016 FINAL  Final  Blood Culture (routine x 2)     Status: Abnormal (Preliminary result)   Collection Time: 01/14/16 10:20 AM  Result Value Ref Range Status   Specimen Description BLOOD RIGHT ANTECUBITAL  Final   Special Requests BOTTLES DRAWN AEROBIC AND ANAEROBIC  5CC  Final   Culture  Setup Time   Final    GRAM NEGATIVE RODS IN BOTH AEROBIC AND ANAEROBIC BOTTLES CRITICAL RESULT CALLED TO, READ BACK BY AND VERIFIED WITH: KIM HAMMONS,PHARMD _0  01/15/16 MKELLY,MLT    Culture (A)  Final    CITROBACTER BRAAKII PSEUDOMONAS AERUGINOSA CULTURE REINCUBATED FOR BETTER GROWTH    Report Status PENDING   Incomplete   Organism ID, Bacteria PSEUDOMONAS AERUGINOSA  Final      Susceptibility   Pseudomonas aeruginosa - MIC*    CEFTAZIDIME >=64 RESISTANT Resistant     CIPROFLOXACIN <=0.25 SENSITIVE Sensitive     GENTAMICIN <=1 SENSITIVE Sensitive     IMIPENEM 2 SENSITIVE Sensitive     PIP/TAZO 64 SENSITIVE Sensitive     CEFEPIME 2 INTERMEDIATE Intermediate     * PSEUDOMONAS AERUGINOSA  Blood Culture (routine x 2)     Status: Abnormal (Preliminary result)   Collection Time: 01/14/16 10:47 AM  Result Value Ref Range Status   Specimen Description BLOOD RIGHT HAND  Final   Special Requests BOTTLES DRAWN AEROBIC AND ANAEROBIC  5CC  Final   Culture  Setup Time   Final    GRAM NEGATIVE RODS IN BOTH AEROBIC AND ANAEROBIC BOTTLES CRITICAL RESULT CALLED TO, READ BACK BY AND VERIFIED WITH: KIMBERELY HAMMONS,PHARMD _1  01/15/16 MKELLY,MLT    Culture (A)  Final    CITROBACTER BRAAKII PSEUDOMONAS AERUGINOSA SUSCEPTIBILITIES PERFORMED ON PREVIOUS CULTURE WITHIN THE LAST 5 DAYS.    Report Status PENDING  Incomplete   Organism ID, Bacteria CITROBACTER BRAAKII  Final      Susceptibility   Citrobacter braakii - MIC*    CEFAZOLIN >=64 RESISTANT Resistant     CEFEPIME <=1 SENSITIVE Sensitive     CEFTAZIDIME >=64 RESISTANT Resistant     CEFTRIAXONE >=64 RESISTANT Resistant     CIPROFLOXACIN <=0.25 SENSITIVE Sensitive     GENTAMICIN <=1 SENSITIVE Sensitive     IMIPENEM 1 SENSITIVE Sensitive     TRIMETH/SULFA <=20 SENSITIVE Sensitive     PIP/TAZO >=128 RESISTANT Resistant     * CITROBACTER BRAAKII  Blood Culture ID Panel (Reflexed)     Status: Abnormal   Collection Time: 01/14/16 10:47 AM  Result Value Ref Range Status   Enterococcus species NOT DETECTED NOT DETECTED Final   Listeria monocytogenes NOT DETECTED NOT DETECTED Final   Staphylococcus species NOT DETECTED NOT DETECTED Final   Staphylococcus aureus NOT DETECTED NOT DETECTED Final   Streptococcus species NOT DETECTED NOT DETECTED Final    Streptococcus agalactiae NOT DETECTED NOT DETECTED Final   Streptococcus pneumoniae NOT DETECTED NOT DETECTED Final   Streptococcus pyogenes NOT DETECTED NOT DETECTED Final   Acinetobacter baumannii NOT DETECTED NOT DETECTED Final   Enterobacteriaceae species DETECTED (A) NOT DETECTED Final    Comment: CRITICAL RESULT CALLED TO, READ BACK BY AND VERIFIED WITH: KIM HAMMONS,PHARMD _0  01/15/16 MKELLY,MLT    Enterobacter cloacae complex NOT DETECTED NOT DETECTED Final   Escherichia coli NOT DETECTED NOT DETECTED Final   Klebsiella oxytoca NOT DETECTED NOT DETECTED Final   Klebsiella pneumoniae NOT DETECTED NOT DETECTED Final   Proteus species NOT DETECTED NOT DETECTED Final   Serratia marcescens NOT DETECTED NOT DETECTED Final   Carbapenem resistance NOT DETECTED NOT DETECTED Final   Haemophilus influenzae NOT DETECTED NOT DETECTED Final   Neisseria meningitidis NOT DETECTED NOT DETECTED Final   Pseudomonas aeruginosa DETECTED (A) NOT DETECTED Final    Comment: CRITICAL RESULT CALLED TO, READ BACK BY AND VERIFIED WITH: KIM HAMMONS,PHARMD _1  01/15/16 MKELLY,MLT    Candida albicans NOT DETECTED NOT DETECTED Final   Candida glabrata NOT DETECTED NOT DETECTED Final   Candida krusei NOT DETECTED NOT DETECTED Final   Candida parapsilosis NOT DETECTED NOT DETECTED Final   Candida tropicalis NOT DETECTED NOT DETECTED Final  Urine culture     Status: Abnormal   Collection Time: 01/14/16 11:18 AM  Result Value Ref Range Status   Specimen Description URINE, RANDOM  Final   Special Requests NONE  Final   Culture MULTIPLE SPECIES PRESENT, SUGGEST RECOLLECTION (A)  Final   Report Status 01/15/2016 FINAL  Final  MRSA PCR Screening     Status: None   Collection Time: 01/14/16  4:51 PM  Result Value Ref Range Status   MRSA by PCR NEGATIVE NEGATIVE Final    Comment:        The GeneXpert MRSA Assay (FDA approved for NASAL specimens only), is one component of a comprehensive MRSA  colonization surveillance program. It is not intended to diagnose MRSA infection nor to guide or monitor treatment for MRSA infections.       Radiology Studies: No results found.    Scheduled Meds: . aspirin EC  81 mg Oral Daily  . atorvastatin  80 mg Oral QPM  . feeding supplement (ENSURE ENLIVE)  237 mL Oral BID BM  . Influenza vac split quadrivalent PF  0.5 mL Intramuscular Tomorrow-1000  . mouth rinse  15 mL Mouth Rinse BID  . piperacillin-tazobactam (ZOSYN)  IV  3.375 g Intravenous Q8H  . polyethylene glycol  17 g Oral Daily  . potassium chloride  40 mEq Oral Once  . senna  1 tablet Oral Daily   Continuous Infusions: . sodium chloride Stopped (01/16/16 1845)     LOS: 4 days    Chipper Oman, MD [facility] Pager [phone]  If 7PM-7AM, please contact night-coverage [email or web address] Password [email or web address] 01/18/2016, 9:07 AM

## 2016-01-19 ENCOUNTER — Other Ambulatory Visit: Payer: Self-pay

## 2016-01-19 DIAGNOSIS — B001 Herpesviral vesicular dermatitis: Secondary | ICD-10-CM

## 2016-01-19 LAB — CBC
HCT: 38.7 % — ABNORMAL LOW (ref 39.0–52.0)
Hemoglobin: 12.8 g/dL — ABNORMAL LOW (ref 13.0–17.0)
MCH: 30.1 pg (ref 26.0–34.0)
MCHC: 33.1 g/dL (ref 30.0–36.0)
MCV: 91.1 fL (ref 78.0–100.0)
Platelets: 88 10*3/uL — ABNORMAL LOW (ref 150–400)
RBC: 4.25 MIL/uL (ref 4.22–5.81)
RDW: 14.6 % (ref 11.5–15.5)
WBC: 11.9 10*3/uL — ABNORMAL HIGH (ref 4.0–10.5)

## 2016-01-19 LAB — COMPREHENSIVE METABOLIC PANEL
ALT: 69 U/L — ABNORMAL HIGH (ref 17–63)
AST: 51 U/L — ABNORMAL HIGH (ref 15–41)
Albumin: 2.2 g/dL — ABNORMAL LOW (ref 3.5–5.0)
Alkaline Phosphatase: 339 U/L — ABNORMAL HIGH (ref 38–126)
Anion gap: 6 (ref 5–15)
BUN: 18 mg/dL (ref 6–20)
CO2: 26 mmol/L (ref 22–32)
Calcium: 8.5 mg/dL — ABNORMAL LOW (ref 8.9–10.3)
Chloride: 106 mmol/L (ref 101–111)
Creatinine, Ser: 0.96 mg/dL (ref 0.61–1.24)
GFR calc Af Amer: >60 mL/min (ref >60)
GFR calc non Af Amer: >60 mL/min (ref >60)
Glucose, Bld: 126 mg/dL — ABNORMAL HIGH (ref 65–99)
Potassium: 4 mmol/L (ref 3.5–5.1)
Sodium: 138 mmol/L (ref 135–145)
Total Bilirubin: 1.4 mg/dL — ABNORMAL HIGH (ref 0.3–1.2)
Total Protein: 4.9 g/dL — ABNORMAL LOW (ref 6.5–8.1)

## 2016-01-19 LAB — MAGNESIUM: Magnesium: 1.9 mg/dL (ref 1.7–2.4)

## 2016-01-19 LAB — PHOSPHORUS: Phosphorus: 2.9 mg/dL (ref 2.5–4.6)

## 2016-01-19 MED ORDER — CALCIUM CARBONATE ANTACID 500 MG PO CHEW
3.0000 | CHEWABLE_TABLET | Freq: Once | ORAL | Status: AC
  Administered 2016-01-19: 600 mg via ORAL
  Filled 2016-01-19: qty 3

## 2016-01-19 MED ORDER — MAGIC MOUTHWASH W/LIDOCAINE
5.0000 mL | Freq: Three times a day (TID) | ORAL | Status: DC
  Administered 2016-01-19 – 2016-01-21 (×5): 5 mL via ORAL
  Filled 2016-01-19 (×5): qty 5

## 2016-01-19 NOTE — Evaluation (Signed)
Physical Therapy Evaluation Patient Details Name: Ronald [name] MRN: [id number] DOB: [birth year] Today's Date: 01/19/2016   History of Present Illness  82 y/o M, former smoker, with PMH of arthritis, Bell's palsy, GERD, diverticulosis, CAD s/p CABG / stents (LVEF 45%), HTN, HLD, overactive bladder, prostate cancer, urethral stricture s/p chronic indwelling catheter with frequent UTI's  Presented 10/19 with AMS, +urosepsis, hypotension, pressors. Cardiology was consulted due to elevation in troponin up to 9.7. Cardiology deemed that might be due to demand ischemia versus STEMI     Clinical Impression  Pt admitted with above diagnosis 10/19. Patient became very fatigued/weak requiring mod assist to ambulate the last 10 feet of 35 foot walk (and to safely pivot and sit at foot of his bed--too tired to walk to [facility]). Patient also with fecal incontinence while walking. Discussed discharge planning and patient agrees that in his current state he is not safe to discharge home with wife. He is reluctant to agree to SNF. He is highly motivated and has had a rapid decline in his functional status due to this illness. Anticipate excellent progress with continued intense therapies.  Pt currently with functional limitations due to the deficits listed below (see PT Problem List).  Pt will benefit from skilled PT to increase their independence and safety with mobility to allow discharge to the venue listed below.       Follow Up Recommendations CIR (placed Inpatient Rehab Screen order);Supervision/Assistance - 24 hour    Equipment Recommendations  None recommended by PT    Recommendations for Other Services Rehab consult;OT consult     Precautions / Restrictions Precautions Precautions: Fall;Other (comment) Precaution Comments: fecal incontinence (on eval) Per pt, not at his baseline      Mobility  Bed Mobility Overal bed mobility: Needs Assistance Bed Mobility: Rolling;Sidelying to Sit;Sit to  Supine Rolling: Min assist Sidelying to sit: Mod assist;HOB elevated   Sit to supine: Min assist   General bed mobility comments: with rail, HOB elevated to exit; weak UEs and torso required assist; assist to legs to return to supine  Transfers Overall transfer level: Needs assistance Equipment used: 1 person hand held assist Transfers: Sit to/from Stand Sit to Stand: Mod assist         General transfer comment: significant assist off toilet; less from raised surface  Ambulation/Gait Ambulation/Gait assistance: Mod assist Ambulation Distance (Feet): 35 Feet (toileted, 10 ft) Assistive device: 1 person hand held assist Gait Pattern/deviations: Step-through pattern;Decreased stride length;Narrow base of support;Trunk flexed     General Gait Details: Pt became increasingly fatigued (and rushing due to fecal incontinence) on return to room/bathroom with increased physical assist. After seated for toileting and "clean up" x 10 minutes, pt very fatigued with walk back to bed with unsafe reach and turn to sit at foot of bed as approaching.   Stairs            Wheelchair Mobility    Modified Rankin (Stroke Patients Only)       Balance Overall balance assessment: Needs assistance Sitting-balance support: No upper extremity supported;Feet supported Sitting balance-Leahy Scale: Fair (when fatigued)     Standing balance support: Single extremity supported Standing balance-Leahy Scale: Poor                               Pertinent Vitals/Pain Pain Assessment: No/denies pain    Home Living Family/patient expects to be discharged to:: Private residence Living Arrangements: Spouse/significant  other Available Help at Discharge: Family;Available 24 hours/day (children also local) Type of Home: House Home Access: Stairs to enter Entrance Stairs-Rails: Right Entrance Stairs-Number of Steps: 3 Home Layout: One level Home Equipment: Cane - single point;Shower  seat;Grab bars - tub/shower;Grab bars - toilet;Walker - 2 wheels      Prior Function Level of Independence: Independent         Comments: drives, grocery shops, gardens     Hand Dominance   Dominant Hand: Right    Extremity/Trunk Assessment   Upper Extremity Assessment: Generalized weakness           Lower Extremity Assessment: Generalized weakness      Cervical / Trunk Assessment: Kyphotic  Communication   Communication: HOH  Cognition Arousal/Alertness: Awake/alert Behavior During Therapy: WFL for tasks assessed/performed Overall Cognitive Status: Within Functional Limits for tasks assessed                      General Comments      Exercises     Assessment/Plan    PT Assessment Patient needs continued PT services  PT Problem List Decreased strength;Decreased activity tolerance;Decreased balance;Decreased mobility;Decreased knowledge of use of DME          PT Treatment Interventions DME instruction;Gait training;Stair training;Functional mobility training;Therapeutic activities;Therapeutic exercise;Balance training;Patient/family education    PT Goals (Current goals can be found in the Care Plan section)  Acute Rehab PT Goals Patient Stated Goal: get strong enough to go straight home, but considering rehab PT Goal Formulation: With patient Time For Goal Achievement: 01/26/16 Potential to Achieve Goals: Good    Frequency Min 3X/week   Barriers to discharge        Co-evaluation               End of Session Equipment Utilized During Treatment: Gait belt Activity Tolerance: Patient limited by fatigue;Treatment limited secondary to medical complications (Comment) (incontinence) Patient left: in bed;with call bell/phone within reach;with bed alarm set;with family/visitor present Nurse Communication: Mobility status;Other (comment) (incontinence)         Time: 6381-7711 PT Time Calculation (min) (ACUTE ONLY): 44 min   Charges:    PT Evaluation $PT Eval Low Complexity: 1 Procedure PT Treatments $Gait Training: 8-22 mins (charges reflect therapy time)   PT G Codes:        Zeola Brys Jan 24, 2016, 5:16 PM  Pager [phone]

## 2016-01-19 NOTE — Progress Notes (Signed)
PROGRESS NOTE [facility]   Ronald [name]   DSK:[id number] DOB: 1927-11-28  DOA: 01/14/2016 PCP: Nyoka Cowden, MD   Brief Narrative:  82 y/o M, former smoker, with PMH of arthritis, Bell's palsy, GERD, diverticulosis, CAD s/p CABG / stents (LVEF 45%), HTN, HLD, overactive bladder, prostate cancer, urethral stricture s/p chronic indwelling catheter with frequent UTI's and recent ER visit for suspected UTI (01/12/2016 - was treated with keflex, cultures grew multiple species) who presented to [facility] on 10/19 with altered mental status and abdominal pain. He was admitted to the ICU with hypotension and confusion concerning for septic shock secondary to urosepsis. He was started on pressors and IV antibiotics urology was consulted, suprapubic catheter was changed. Also cardiology was consulted due to elevation in troponin up to 9.7, now trending down. Cardiology deemed that might be due to demand ischemia versus STEMI but no cardiac cath needed at this time. Ultrasound was done for abdominal pain, which showed ? possible pancreatic duct dilation with ? mild intrahepatic biliary duct dilation, patient treated conservatively. Transferred to [facility] 10[facility]22 to continue management.   Subjective: Patient c/o sore mouth and lip sores. No other complaints. Antibiotics switched to Po, awaiting for blood cultures to be negative. No acute events   Assessment & Plan:  Septic Shock - Resolved - secondary to Urosepsis Blood culture + for Pseudomonas and Citrobacter, related to chronic suprapubic catheter  -Continue antibiotics, Pharm recs appreciated - switched to cipro - both bacteria sensitive to quinolone  -Citrobacter resistant to Zosyn  -Urology recs appreciated  -d/c IVF  -Repeated Blood cultures pending if negative over 24 hr can d/c home with PO cipro.  Aphthous Ulcers - Patient report chronic Herpes labialis - likely stress related due to hospital stay  -magic mouth wash with  lidocaine      Hx CAD, ICM (EF 35-40%), HTN (reportedly runs low, on imdur at baseline), HLD, CABG Elevated troponin, suspect NSTEMI - TNIs trended down suspected demand ischemia Small Bilateral Pleural Effusions - noted on admit, hx of CHF (EF 45-50%) - Improved pulmonary edema on CXR 10/20 - stable  -O2 to support sats > 90% -Continue home meds  AKI - in setting of suspected septic shock - Resolved  Hx Prostate CA, overactive bladder, urethral stricture, chronic cystitis & suprapubic catheter -Trend BMP  Abdominal Pain - Asymptomatic - LFT trending down  Elevated T-Bili, Alk Phos; AST/ALT Hx GERD, Diverticulosis Pancreatic duct prominence on [place], s/p cholecystectomy Will hold on the MRCP for now  Advance to full diet   Anemia likely due to chronic diseases - resolved  Hb 13.4 Thrombocytopenia - Improving poss due to sepsis 53 --> 73  DVT prophylaxis: Lovenox  Code Status: Full Family Communication: Family at bedside, plan of care discussed  Disposition Plan: d/c home when medically stable    Consultants:   Urology   Cardiology   Procedures:   ECHO   Antimicrobials:  Zosyn    Objective: Vitals:   01/18/16 1052 01/18/16 1502 01/18/16 2138 01/19/16 0507  BP: (!) 143/66 138/60 (!) 142/61 (!) 136/46  Pulse: 64 60 (!) 58 (!) 57  Resp: 18 18 18 18   Temp: 97.8 F (36.6 C) 97.8 F (36.6 C) 99 F (37.2 C) 98.3 F (36.8 C)  TempSrc: Oral Oral Oral Oral  SpO2: 96%  96% 98%  Weight:    76.9 kg (169 lb 8.5 oz)  Height:        Intake/Output Summary (Last 24 hours) at 01/19/16 1008  Last data filed at 01/19/16 0641  Gross per 24 hour  Intake                0 ml  Output             3450 ml  Net            -3450 ml   Filed Weights   01/17/16 0537 01/18/16 0526 01/19/16 0507  Weight: 78.4 kg (172 lb 13.5 oz) 76.4 kg (168 lb 6.9 oz) 76.9 kg (169 lb 8.5 oz)    Examination:  General exam: Appears calm and comfortable  Respiratory system: Clear to auscultation.  No wheezes,crackle or rhonchi Cardiovascular system: S1 & S2 heard, RRR. No JVD, murmurs, rubs or gallops Gastrointestinal system: Abdomen is nondistended, soft and nontender. No organomegaly or masses felt. Suprapubic cath in place  Central nervous system: Alert and oriented. No focal neurological deficits. Extremities: No pedal edema.   Data Reviewed: I have personally reviewed following labs and imaging studies  CBC:  Recent Labs Lab 01/12/16 2316 01/14/16 1028 01/15/16 0045 01/16/16 0229 01/17/16 0525 01/18/16 0059 01/19/16 0425  WBC 13.2* 3.5* 26.4* 21.4* 17.0* 13.6* 11.9*  NEUTROABS 11.0* 3.4  --   --   --  11.6*  --   HGB 14.2 15.3 13.2 12.9* 12.6* 13.4 12.8*  HCT 43.0 45.0 39.6 38.6* 36.9* 39.1 38.7*  MCV 91.3 90.9 91.9 90.4 89.1 89.1 91.1  PLT 123* 55* 50* 53* 57* 77* 88*   Basic Metabolic Panel:  Recent Labs Lab 01/14/16 1651  01/15/16 0543 01/15/16 1526 01/16/16 0229 01/17/16 0525 01/18/16 0059 01/19/16 0425  NA  --   < > 135 137 137 139 138 138  K  --   < > 4.5 4.3 3.9 4.1 3.6 4.0  CL  --   < > 109 113* 113* 110 107 106  CO2  --   < > 18* 17* 19* 24 23 26   GLUCOSE  --   < > 159* 160* 123* 106* 120* 126*  BUN  --   < > 33* 34* 37* 27* 21* 18  CREATININE  --   < > 1.97* 1.75* 1.42* 1.15 0.92 0.96  CALCIUM  --   < > 8.4* 7.8* 8.0* 8.4* 8.6* 8.5*  MG 1.6*  --  2.1  --  2.0 2.0 1.9 1.9  PHOS 2.2*  --   --   --  2.4* 1.7* 2.3* 2.9  < > = values in this interval not displayed. GFR: Estimated Creatinine Clearance: 52.9 mL/min (by C-G formula based on SCr of 0.96 mg/dL). Liver Function Tests:  Recent Labs Lab 01/15/16 0045 01/15/16 1526 01/16/16 0229 01/17/16 0525 01/18/16 0059 01/19/16 0425  AST 138* 122* 92* 100*  --  51*  ALT 87* 85* 75* 88*  --  69*  ALKPHOS 134* 137* 180* 392*  --  339*  BILITOT 2.3* 1.7* 1.3* 1.1  --  1.4*  PROT 5.2* 5.0* 5.0* 4.7*  --  4.9*  ALBUMIN 2.6* 2.4* 2.3* 2.2*  2.1* 2.3* 2.2*    Recent Labs Lab 01/14/16 1651    LIPASE 17  AMYLASE 36   No results for input(s): AMMONIA in the last 168 hours. Coagulation Profile:  Recent Labs Lab 01/14/16 1028  INR 1.21   Cardiac Enzymes:  Recent Labs Lab 01/15/16 0045 01/15/16 0543 01/15/16 1237 01/15/16 1917 01/18/16 0059  TROPONINI 7.70* 9.27* 8.28* 5.04* 2.23*   BNP (last 3 results) No results for input(s): PROBNP  in the last 8760 hours. HbA1C: No results for input(s): HGBA1C in the last 72 hours. CBG:  Recent Labs Lab 01/14/16 1018 01/14/16 1634  GLUCAP 138* 159*   Lipid Profile: No results for input(s): CHOL, HDL, LDLCALC, TRIG, CHOLHDL, LDLDIRECT in the last 72 hours. Thyroid Function Tests: No results for input(s): TSH, T4TOTAL, FREET4, T3FREE, THYROIDAB in the last 72 hours. Anemia Panel: No results for input(s): VITAMINB12, FOLATE, FERRITIN, TIBC, IRON, RETICCTPCT in the last 72 hours. Sepsis Labs:  Recent Labs Lab 01/14/16 1028  01/14/16 1849 01/15/16 0543 01/15/16 0934 01/15/16 1119 01/15/16 1526 01/16/16 0229 01/17/16 0525  PROCALCITON 3.12  --   --   --   --  79.67  --  39.79 21.45  LATICACIDVEN  --   < > 3.2* 3.4* 3.2*  --  2.5*  --   --   < > = values in this interval not displayed.  Recent Results (from the past 240 hour(s))  Urine culture     Status: Abnormal   Collection Time: 01/13/16 12:15 AM  Result Value Ref Range Status   Specimen Description URINE, RANDOM  Final   Special Requests NONE  Final   Culture MULTIPLE SPECIES PRESENT, SUGGEST RECOLLECTION (A)  Final   Report Status 01/14/2016 FINAL  Final  Blood Culture (routine x 2)     Status: Abnormal   Collection Time: 01/14/16 10:20 AM  Result Value Ref Range Status   Specimen Description BLOOD RIGHT ANTECUBITAL  Final   Special Requests BOTTLES DRAWN AEROBIC AND ANAEROBIC  5CC  Final   Culture  Setup Time   Final    GRAM NEGATIVE RODS IN BOTH AEROBIC AND ANAEROBIC BOTTLES CRITICAL RESULT CALLED TO, READ BACK BY AND VERIFIED WITH: KIM  HAMMONS,PHARMD @0049  01/15/16 MKELLY,MLT    Culture (A)  Final    CITROBACTER BRAAKII PSEUDOMONAS AERUGINOSA SUSCEPTIBILITIES PERFORMED ON PREVIOUS CULTURE WITHIN THE LAST 5 DAYS.    Report Status 01/18/2016 FINAL  Final   Organism ID, Bacteria PSEUDOMONAS AERUGINOSA  Final      Susceptibility   Pseudomonas aeruginosa - MIC*    CEFTAZIDIME >=64 RESISTANT Resistant     CIPROFLOXACIN <=0.25 SENSITIVE Sensitive     GENTAMICIN <=1 SENSITIVE Sensitive     IMIPENEM 2 SENSITIVE Sensitive     PIP/TAZO 64 SENSITIVE Sensitive     CEFEPIME 2 INTERMEDIATE Intermediate     * PSEUDOMONAS AERUGINOSA  Blood Culture (routine x 2)     Status: Abnormal   Collection Time: 01/14/16 10:47 AM  Result Value Ref Range Status   Specimen Description BLOOD RIGHT HAND  Final   Special Requests BOTTLES DRAWN AEROBIC AND ANAEROBIC  5CC  Final   Culture  Setup Time   Final    GRAM NEGATIVE RODS IN BOTH AEROBIC AND ANAEROBIC BOTTLES CRITICAL RESULT CALLED TO, READ BACK BY AND VERIFIED WITH: KIMBERELY HAMMONS,PHARMD @0049  01/15/16 MKELLY,MLT    Culture (A)  Final    CITROBACTER BRAAKII PSEUDOMONAS AERUGINOSA SUSCEPTIBILITIES PERFORMED ON PREVIOUS CULTURE WITHIN THE LAST 5 DAYS.    Report Status 01/18/2016 FINAL  Final   Organism ID, Bacteria CITROBACTER BRAAKII  Final      Susceptibility   Citrobacter braakii - MIC*    CEFAZOLIN >=64 RESISTANT Resistant     CEFEPIME <=1 SENSITIVE Sensitive     CEFTAZIDIME >=64 RESISTANT Resistant     CEFTRIAXONE >=64 RESISTANT Resistant     CIPROFLOXACIN <=0.25 SENSITIVE Sensitive     GENTAMICIN <=1 SENSITIVE Sensitive  IMIPENEM 1 SENSITIVE Sensitive     TRIMETH/SULFA <=20 SENSITIVE Sensitive     PIP/TAZO >=128 RESISTANT Resistant     * CITROBACTER BRAAKII  Blood Culture ID Panel (Reflexed)     Status: Abnormal   Collection Time: 01/14/16 10:47 AM  Result Value Ref Range Status   Enterococcus species NOT DETECTED NOT DETECTED Final   Listeria monocytogenes NOT  DETECTED NOT DETECTED Final   Staphylococcus species NOT DETECTED NOT DETECTED Final   Staphylococcus aureus NOT DETECTED NOT DETECTED Final   Streptococcus species NOT DETECTED NOT DETECTED Final   Streptococcus agalactiae NOT DETECTED NOT DETECTED Final   Streptococcus pneumoniae NOT DETECTED NOT DETECTED Final   Streptococcus pyogenes NOT DETECTED NOT DETECTED Final   Acinetobacter baumannii NOT DETECTED NOT DETECTED Final   Enterobacteriaceae species DETECTED (A) NOT DETECTED Final    Comment: CRITICAL RESULT CALLED TO, READ BACK BY AND VERIFIED WITH: KIM HAMMONS,PHARMD @0049  01/15/16 MKELLY,MLT    Enterobacter cloacae complex NOT DETECTED NOT DETECTED Final   Escherichia coli NOT DETECTED NOT DETECTED Final   Klebsiella oxytoca NOT DETECTED NOT DETECTED Final   Klebsiella pneumoniae NOT DETECTED NOT DETECTED Final   Proteus species NOT DETECTED NOT DETECTED Final   Serratia marcescens NOT DETECTED NOT DETECTED Final   Carbapenem resistance NOT DETECTED NOT DETECTED Final   Haemophilus influenzae NOT DETECTED NOT DETECTED Final   Neisseria meningitidis NOT DETECTED NOT DETECTED Final   Pseudomonas aeruginosa DETECTED (A) NOT DETECTED Final    Comment: CRITICAL RESULT CALLED TO, READ BACK BY AND VERIFIED WITH: KIM HAMMONS,PHARMD @0049  01/15/16 MKELLY,MLT    Candida albicans NOT DETECTED NOT DETECTED Final   Candida glabrata NOT DETECTED NOT DETECTED Final   Candida krusei NOT DETECTED NOT DETECTED Final   Candida parapsilosis NOT DETECTED NOT DETECTED Final   Candida tropicalis NOT DETECTED NOT DETECTED Final  Urine culture     Status: Abnormal   Collection Time: 01/14/16 11:18 AM  Result Value Ref Range Status   Specimen Description URINE, RANDOM  Final   Special Requests NONE  Final   Culture MULTIPLE SPECIES PRESENT, SUGGEST RECOLLECTION (A)  Final   Report Status 01/15/2016 FINAL  Final  MRSA PCR Screening     Status: None   Collection Time: 01/14/16  4:51 PM  Result  Value Ref Range Status   MRSA by PCR NEGATIVE NEGATIVE Final    Comment:        The GeneXpert MRSA Assay (FDA approved for NASAL specimens only), is one component of a comprehensive MRSA colonization surveillance program. It is not intended to diagnose MRSA infection nor to guide or monitor treatment for MRSA infections.       Radiology Studies: No results found.    Scheduled Meds: . aspirin EC  81 mg Oral Daily  . atorvastatin  80 mg Oral QPM  . ciprofloxacin  500 mg Oral BID  . feeding supplement (ENSURE ENLIVE)  237 mL Oral BID BM  . Influenza vac split quadrivalent PF  0.5 mL Intramuscular Tomorrow-1000  . mouth rinse  15 mL Mouth Rinse BID  . polyethylene glycol  17 g Oral Daily  . senna  1 tablet Oral Daily   Continuous Infusions:     LOS: 5 days    Chipper Oman, MD [facility] Pager [phone]  If 7PM-7AM, please contact night-coverage [email or web address] Password [email or web address] 01/19/2016, 10:08 AM

## 2016-01-19 NOTE — Consult Note (Addendum)
   [facility] CM Inpatient Consult   01/19/2016  Ronald [name] 09-Oct-1927 [id number]   1250 pm: Patient evaluated for community based chronic disease management services with [facility] as a benefit of patient's Medicare Insurance for HF history. Chart review reveals the patient is a 82 y/o M, former smoker, with PMH of arthritis, Bell's palsy, GERD, diverticulosis, CAD s/p CABG / stents (LVEF 45%), HTN, HLD, overactive bladder, prostate cancer, urethral stricture s/p chronic indwelling catheter with frequent UTI's and recent ER visit for suspected UTI (01/12/2016 - was treated with keflex, cultures grew multiple species) who presented on 10/19 with altered mental status and abdominal pain. Spoke with patient and his wife, Ronald [name], at bedside to explain [facility] services. Explained benefits of care management services.  Wife states they are usually active with [facility] for [facility] therapy.  Consent form signed.  Patient endorses Dr. Bluford Kaufmann as his primary care provider but he states he doesn't see him that much.   Patient will receive post hospital discharge call and will be evaluated for monthly home visits for assessments and disease process education.  Left contact information and [facility] literature at bedside. Made Inpatient Case Manager aware that [facility] following. Of note, [facility] Care Management services does not replace or interfere with any services that are arranged by inpatient case management or social work.  For additional questions or referrals please contact:    Natividad Brood, RN BSN [facility] Liaison  [phone] business mobile phone Toll free office [phone]  [phone]:  Patient and wife confirm that he receives his medications by mail from the [facility].  He also has visits at the [facility] in [place].  Uses the [facility] on [address] for immediate medication needs.

## 2016-01-19 NOTE — Care Management Note (Signed)
Case Management Note  Patient Details  Name: Ronald [name] MRN: [id number] Date of Birth: May 22, 1927  Subjective/Objective:                 Spoke with patient and wife in the room, they were recently active with [facility] and would like to use them for [facility] services after DC if needed. Patient lives with wife, independently ambulated prior to admission, but does have walker and shower chair at home. Their son and daughter both live in town, about 20 minutes away per pt's wife. CM spoke with MD, MD states patient to remain inpatient until cultures read out, expect DC tomorrow.    Action/Plan:   Expected Discharge Date:                  Expected Discharge Plan:  [place]  In-House Referral:  NA  Discharge planning Services  CM Consult  Post Acute Care Choice:    Choice offered to:     DME Arranged:    DME Agency:     HH Arranged:    HH Agency:     Status of Service:  In process, will continue to follow  If discussed at Long Length of Stay Meetings, dates discussed:    Additional Comments:  Carles Collet, RN 01/19/2016, 12:15 PM

## 2016-01-20 DIAGNOSIS — E8809 Other disorders of plasma-protein metabolism, not elsewhere classified: Secondary | ICD-10-CM

## 2016-01-20 DIAGNOSIS — R159 Full incontinence of feces: Secondary | ICD-10-CM

## 2016-01-20 DIAGNOSIS — D72820 Lymphocytosis (symptomatic): Secondary | ICD-10-CM

## 2016-01-20 DIAGNOSIS — Z8669 Personal history of other diseases of the nervous system and sense organs: Secondary | ICD-10-CM

## 2016-01-20 DIAGNOSIS — R7881 Bacteremia: Secondary | ICD-10-CM

## 2016-01-20 DIAGNOSIS — Z9359 Other cystostomy status: Secondary | ICD-10-CM

## 2016-01-20 DIAGNOSIS — R001 Bradycardia, unspecified: Secondary | ICD-10-CM

## 2016-01-20 DIAGNOSIS — A4152 Sepsis due to Pseudomonas: Secondary | ICD-10-CM

## 2016-01-20 DIAGNOSIS — K219 Gastro-esophageal reflux disease without esophagitis: Secondary | ICD-10-CM

## 2016-01-20 DIAGNOSIS — Z8546 Personal history of malignant neoplasm of prostate: Secondary | ICD-10-CM

## 2016-01-20 DIAGNOSIS — E46 Unspecified protein-calorie malnutrition: Secondary | ICD-10-CM

## 2016-01-20 DIAGNOSIS — I25810 Atherosclerosis of coronary artery bypass graft(s) without angina pectoris: Secondary | ICD-10-CM

## 2016-01-20 DIAGNOSIS — J9601 Acute respiratory failure with hypoxia: Secondary | ICD-10-CM

## 2016-01-20 DIAGNOSIS — N178 Other acute kidney failure: Secondary | ICD-10-CM

## 2016-01-20 DIAGNOSIS — N19 Unspecified kidney failure: Secondary | ICD-10-CM

## 2016-01-20 DIAGNOSIS — D696 Thrombocytopenia, unspecified: Secondary | ICD-10-CM

## 2016-01-20 LAB — RENAL FUNCTION PANEL
Albumin: 2.4 g/dL — ABNORMAL LOW (ref 3.5–5.0)
Anion gap: 8 (ref 5–15)
BUN: 21 mg/dL — ABNORMAL HIGH (ref 6–20)
CO2: 25 mmol/L (ref 22–32)
Calcium: 9 mg/dL (ref 8.9–10.3)
Chloride: 105 mmol/L (ref 101–111)
Creatinine, Ser: 0.9 mg/dL (ref 0.61–1.24)
GFR calc Af Amer: >60 mL/min (ref >60)
GFR calc non Af Amer: >60 mL/min (ref >60)
Glucose, Bld: 119 mg/dL — ABNORMAL HIGH (ref 65–99)
Phosphorus: 3.3 mg/dL (ref 2.5–4.6)
Potassium: 4 mmol/L (ref 3.5–5.1)
Sodium: 138 mmol/L (ref 135–145)

## 2016-01-20 LAB — CBC
HCT: 39.2 % (ref 39.0–52.0)
Hemoglobin: 13.2 g/dL (ref 13.0–17.0)
MCH: 30.3 pg (ref 26.0–34.0)
MCHC: 33.7 g/dL (ref 30.0–36.0)
MCV: 90.1 fL (ref 78.0–100.0)
Platelets: 126 10*3/uL — ABNORMAL LOW (ref 150–400)
RBC: 4.35 MIL/uL (ref 4.22–5.81)
RDW: 14.5 % (ref 11.5–15.5)
WBC: 13.6 10*3/uL — ABNORMAL HIGH (ref 4.0–10.5)

## 2016-01-20 LAB — MAGNESIUM: Magnesium: 1.9 mg/dL (ref 1.7–2.4)

## 2016-01-20 NOTE — Progress Notes (Signed)
Inpatient Rehabilitation  PT is recommending IP Rehab for Mr. Sharp.  At this time, we are recommending IP Rehab.  Please order if you are agreeable.    [facility] Admissions Coordinator Cell [phone] Office [phone]

## 2016-01-20 NOTE — Progress Notes (Signed)
I met with pt at bedside and then contacted his wife by phone to discuss an inpt rehab admission. They are both in agreement to admission as recommended. I will follow up in the morning. Rehab bed is available tomorrow. [phone]

## 2016-01-20 NOTE — Progress Notes (Signed)
Pt states that he is not in any pain tonight, but has been having abdominal pain throughout the day after eating his meals. Will continue to monitor and treat per MD orders.

## 2016-01-20 NOTE — Progress Notes (Signed)
PROGRESS NOTE  Ronald [name] XBL:[id number] DOB: March 14, 1928 DOA: 01/14/2016 PCP: Nyoka Cowden, MD  Brief History:  82 y/o M, former smoker, with PMH of arthritis, Bell's palsy, GERD, diverticulosis, CAD s/p CABG / stents (LVEF 45%), HTN, HLD, overactive bladder, prostate cancer, urethral stricture s/p chronic indwelling catheter with frequent UTI's and recent ER visit for suspected UTI (01/12/2016 - was treated with keflex, cultures grew multiple species) who presented to [facility] on 10/19 with altered mental status and abdominal pain. He was admitted to the ICU with hypotension and confusion concerning for septic shock secondary to urosepsis. He was started on pressors and IV antibiotics urology was consulted, suprapubic catheter was changed. Also cardiology was consulted due to elevation in troponin up to 9.7, now trending down. Cardiology deemed that might be due to demand ischemia versus STEMI but no cardiac cath needed at this time. Ultrasound was done for abdominal pain, which showed ? possible pancreatic duct dilation with ? mild intrahepatic biliary duct dilation, patient treated conservatively. Transferred to [facility] on 10/22 to continue management.   Assessment/Plan: Sepsis -UTI related to chronic suprapubic catheter  -sepsis physiology resolved -secondary to bacteremia and UTI -Continue antibiotics -continue cipro, plan 14 days from last neg culture -appreciate urology consult -d/c IVF  -Repeated Blood cultures neg to date  Bacteremia -pseudomonas and citrobacter -continue cipro --Repeated Blood cultures neg to date -source likely urine  Aphthous Ulcers - Patient report chronic Herpes labialis - likely stress related due to hospital stay  -magic mouth wash with lidocaine      NSTEMI -appreciate cardiology -troponins peaked at 9.27 -Echo with EF 40-45%, anterior and apical hypokinesis, basal mid to inferior HK -manage medically -With resolving AKI, advanced  age, ongoing infection, thrombotcytopenia and clinical improvement would not pursue cath at this time per cardiology -Consider plavix once platelets improve.  No beta blocker due to history of bradycardia per prior clinic notes. Has had hypotension on ACE-Is and also recovering from AKI.  -no further cardiac testing  AKI  - in setting of suspected septic shock  - Resolved  - am BMP  Hx Prostate CA, overactive bladder, urethral stricture, chronic cystitis & suprapubic catheter -Trend BMP -suprapubic tube changed this admission  Abdominal Pain -  -resolved -tolerating diet  Transaminasemia -secondary to sepsis -improving -01/14/2016 abdomen ultrasound--prominent pancreatic duct, common about a 4.3 mm -no abd pain -trend LFTs  Thrombocytopenia  -secondary to sepsis -improving  Disposition Plan:   CIR 10/26 if stable and WBC improving Family Communication:   Wife updated at bedside  Consultants:  Cardiology   urology  Code Status:  FULL / DNR  DVT Prophylaxis:  [place] Heparin / [place]da Lovenox   Procedures: As Listed in Progress Note Above  Antibiotics: None    Subjective: Patient denies fevers, chills, headache, chest pain, dyspnea, nausea, vomiting, diarrhea, abdominal pain, dysuria, hematuria, hematochezia, and melena.   Objective: Vitals:   01/19/16 0507 01/19/16 1421 01/19/16 2142 01/20/16 0444  BP: (!) 136/46 (!) 139/58 (!) 137/57 132/62  Pulse: (!) 57 60 60 66  Resp: 18 18 20    Temp: 98.3 F (36.8 C) 97.7 F (36.5 C) 98.8 F (37.1 C) 98.5 F (36.9 C)  TempSrc: Oral Oral Oral Oral  SpO2: 98% 98% 95% 95%  Weight: 76.9 kg (169 lb 8.5 oz)   75.4 kg (166 lb 3.6 oz)  Height:        Intake/Output Summary (Last 24 hours) at 01/20/16  [place] filed at 01/20/16 0447  Gross per 24 hour  Intake                0 ml  Output             2400 ml  Net            -2400 ml   Weight change: -1.5 kg (-3 lb 4.9 oz) Exam:   General:  Pt is alert, follows  commands appropriately, not in acute distress  HEENT: No icterus, No thrush, No neck mass, [place]/AT  Cardiovascular: RRR, S1/S2, no rubs, no gallops  Respiratory: CTA bilaterally, no wheezing, no crackles, no rhonchi  Abdomen: Soft/+BS, non tender, non distended, no guarding  Extremities: No edema, No lymphangitis, No petechiae, No rashes, no synovitis   Data Reviewed: I have personally reviewed following labs and imaging studies Basic Metabolic Panel:  Recent Labs Lab 01/16/16 0229 01/17/16 0525 01/18/16 0059 01/19/16 0425 01/20/16 0704  NA 137 139 138 138 138  K 3.9 4.1 3.6 4.0 4.0  CL 113* 110 107 106 105  CO2 19* 24 23 26 25   GLUCOSE 123* 106* 120* 126* 119*  BUN 37* 27* 21* 18 21*  CREATININE 1.42* 1.15 0.92 0.96 0.90  CALCIUM 8.0* 8.4* 8.6* 8.5* 9.0  MG 2.0 2.0 1.9 1.9 1.9  PHOS 2.4* 1.7* 2.3* 2.9 3.3   Liver Function Tests:  Recent Labs Lab 01/15/16 0045 01/15/16 1526 01/16/16 0229 01/17/16 0525 01/18/16 0059 01/19/16 0425 01/20/16 0704  AST 138* 122* 92* 100*  --  51*  --   ALT 87* 85* 75* 88*  --  69*  --   ALKPHOS 134* 137* 180* 392*  --  339*  --   BILITOT 2.3* 1.7* 1.3* 1.1  --  1.4*  --   PROT 5.2* 5.0* 5.0* 4.7*  --  4.9*  --   ALBUMIN 2.6* 2.4* 2.3* 2.2*  2.1* 2.3* 2.2* 2.4*    Recent Labs Lab 01/14/16 1651  LIPASE 17  AMYLASE 36   No results for input(s): AMMONIA in the last 168 hours. Coagulation Profile:  Recent Labs Lab 01/14/16 1028  INR 1.21   CBC:  Recent Labs Lab 01/14/16 1028  01/16/16 0229 01/17/16 0525 01/18/16 0059 01/19/16 0425 01/20/16 0704  WBC 3.5*  < > 21.4* 17.0* 13.6* 11.9* 13.6*  NEUTROABS 3.4  --   --   --  11.6*  --   --   HGB 15.3  < > 12.9* 12.6* 13.4 12.8* 13.2  HCT 45.0  < > 38.6* 36.9* 39.1 38.7* 39.2  MCV 90.9  < > 90.4 89.1 89.1 91.1 90.1  PLT 55*  < > 53* 57* 77* 88* 126*  < > = values in this interval not displayed. Cardiac Enzymes:  Recent Labs Lab 01/15/16 0045 01/15/16 0543  01/15/16 1237 01/15/16 1917 01/18/16 0059  TROPONINI 7.70* 9.27* 8.28* 5.04* 2.23*   BNP: Invalid input(s): POCBNP CBG:  Recent Labs Lab 01/14/16 1018 01/14/16 1634  GLUCAP 138* 159*   HbA1C: No results for input(s): HGBA1C in the last 72 hours. Urine analysis:    Component Value Date/Time   COLORURINE AMBER (A) 01/14/2016 1118   APPEARANCEUR TURBID (A) 01/14/2016 1118   LABSPEC 1.011 01/14/2016 1118   PHURINE 8.0 01/14/2016 1118   GLUCOSEU NEGATIVE 01/14/2016 1118   HGBUR LARGE (A) 01/14/2016 1118   BILIRUBINUR SMALL (A) 01/14/2016 1118   BILIRUBINUR neg 06/30/2014 1530   KETONESUR 15 (A) 01/14/2016 1118  PROTEINUR 30 (A) 01/14/2016 1118   UROBILINOGEN 0.2 12/04/2014 2344   NITRITE NEGATIVE 01/14/2016 1118   LEUKOCYTESUR LARGE (A) 01/14/2016 1118   Sepsis Labs: @LABRCNTIP (procalcitonin:4,lacticidven:4) ) Recent Results (from the past 240 hour(s))  Urine culture     Status: Abnormal   Collection Time: 01/13/16 12:15 AM  Result Value Ref Range Status   Specimen Description URINE, RANDOM  Final   Special Requests NONE  Final   Culture MULTIPLE SPECIES PRESENT, SUGGEST RECOLLECTION (A)  Final   Report Status 01/14/2016 FINAL  Final  Blood Culture (routine x 2)     Status: Abnormal   Collection Time: 01/14/16 10:20 AM  Result Value Ref Range Status   Specimen Description BLOOD RIGHT ANTECUBITAL  Final   Special Requests BOTTLES DRAWN AEROBIC AND ANAEROBIC  5CC  Final   Culture  Setup Time   Final    GRAM NEGATIVE RODS IN BOTH AEROBIC AND ANAEROBIC BOTTLES CRITICAL RESULT CALLED TO, READ BACK BY AND VERIFIED WITH: KIM HAMMONS,PHARMD @0049  01/15/16 MKELLY,MLT    Culture (A)  Final    CITROBACTER BRAAKII PSEUDOMONAS AERUGINOSA SUSCEPTIBILITIES PERFORMED ON PREVIOUS CULTURE WITHIN THE LAST 5 DAYS.    Report Status 01/18/2016 FINAL  Final   Organism ID, Bacteria PSEUDOMONAS AERUGINOSA  Final      Susceptibility   Pseudomonas aeruginosa - MIC*    CEFTAZIDIME  >=64 RESISTANT Resistant     CIPROFLOXACIN <=0.25 SENSITIVE Sensitive     GENTAMICIN <=1 SENSITIVE Sensitive     IMIPENEM 2 SENSITIVE Sensitive     PIP/TAZO 64 SENSITIVE Sensitive     CEFEPIME 2 INTERMEDIATE Intermediate     * PSEUDOMONAS AERUGINOSA  Blood Culture (routine x 2)     Status: Abnormal   Collection Time: 01/14/16 10:47 AM  Result Value Ref Range Status   Specimen Description BLOOD RIGHT HAND  Final   Special Requests BOTTLES DRAWN AEROBIC AND ANAEROBIC  5CC  Final   Culture  Setup Time   Final    GRAM NEGATIVE RODS IN BOTH AEROBIC AND ANAEROBIC BOTTLES CRITICAL RESULT CALLED TO, READ BACK BY AND VERIFIED WITH: KIMBERELY HAMMONS,PHARMD @0049  01/15/16 MKELLY,MLT    Culture (A)  Final    CITROBACTER BRAAKII PSEUDOMONAS AERUGINOSA SUSCEPTIBILITIES PERFORMED ON PREVIOUS CULTURE WITHIN THE LAST 5 DAYS.    Report Status 01/18/2016 FINAL  Final   Organism ID, Bacteria CITROBACTER BRAAKII  Final      Susceptibility   Citrobacter braakii - MIC*    CEFAZOLIN >=64 RESISTANT Resistant     CEFEPIME <=1 SENSITIVE Sensitive     CEFTAZIDIME >=64 RESISTANT Resistant     CEFTRIAXONE >=64 RESISTANT Resistant     CIPROFLOXACIN <=0.25 SENSITIVE Sensitive     GENTAMICIN <=1 SENSITIVE Sensitive     IMIPENEM 1 SENSITIVE Sensitive     TRIMETH/SULFA <=20 SENSITIVE Sensitive     PIP/TAZO >=128 RESISTANT Resistant     * CITROBACTER BRAAKII  Blood Culture ID Panel (Reflexed)     Status: Abnormal   Collection Time: 01/14/16 10:47 AM  Result Value Ref Range Status   Enterococcus species NOT DETECTED NOT DETECTED Final   Listeria monocytogenes NOT DETECTED NOT DETECTED Final   Staphylococcus species NOT DETECTED NOT DETECTED Final   Staphylococcus aureus NOT DETECTED NOT DETECTED Final   Streptococcus species NOT DETECTED NOT DETECTED Final   Streptococcus agalactiae NOT DETECTED NOT DETECTED Final   Streptococcus pneumoniae NOT DETECTED NOT DETECTED Final   Streptococcus pyogenes NOT  DETECTED NOT DETECTED Final  Acinetobacter baumannii NOT DETECTED NOT DETECTED Final   Enterobacteriaceae species DETECTED (A) NOT DETECTED Final    Comment: CRITICAL RESULT CALLED TO, READ BACK BY AND VERIFIED WITH: KIM HAMMONS,PHARMD @0049  01/15/16 MKELLY,MLT    Enterobacter cloacae complex NOT DETECTED NOT DETECTED Final   Escherichia coli NOT DETECTED NOT DETECTED Final   Klebsiella oxytoca NOT DETECTED NOT DETECTED Final   Klebsiella pneumoniae NOT DETECTED NOT DETECTED Final   Proteus species NOT DETECTED NOT DETECTED Final   Serratia marcescens NOT DETECTED NOT DETECTED Final   Carbapenem resistance NOT DETECTED NOT DETECTED Final   Haemophilus influenzae NOT DETECTED NOT DETECTED Final   Neisseria meningitidis NOT DETECTED NOT DETECTED Final   Pseudomonas aeruginosa DETECTED (A) NOT DETECTED Final    Comment: CRITICAL RESULT CALLED TO, READ BACK BY AND VERIFIED WITH: KIM HAMMONS,PHARMD @0049  01/15/16 MKELLY,MLT    Candida albicans NOT DETECTED NOT DETECTED Final   Candida glabrata NOT DETECTED NOT DETECTED Final   Candida krusei NOT DETECTED NOT DETECTED Final   Candida parapsilosis NOT DETECTED NOT DETECTED Final   Candida tropicalis NOT DETECTED NOT DETECTED Final  Urine culture     Status: Abnormal   Collection Time: 01/14/16 11:18 AM  Result Value Ref Range Status   Specimen Description URINE, RANDOM  Final   Special Requests NONE  Final   Culture MULTIPLE SPECIES PRESENT, SUGGEST RECOLLECTION (A)  Final   Report Status 01/15/2016 FINAL  Final  MRSA PCR Screening     Status: None   Collection Time: 01/14/16  4:51 PM  Result Value Ref Range Status   MRSA by PCR NEGATIVE NEGATIVE Final    Comment:        The GeneXpert MRSA Assay (FDA approved for NASAL specimens only), is one component of a comprehensive MRSA colonization surveillance program. It is not intended to diagnose MRSA infection nor to guide or monitor treatment for MRSA infections.   Culture, blood  (routine x 2)     Status: None (Preliminary result)   Collection Time: 01/18/16 10:46 AM  Result Value Ref Range Status   Specimen Description BLOOD RIGHT ANTECUBITAL  Final   Special Requests IN PEDIATRIC BOTTLE  3CC  Final   Culture NO GROWTH < 24 HOURS  Final   Report Status PENDING  Incomplete  Culture, blood (routine x 2)     Status: None (Preliminary result)   Collection Time: 01/18/16 10:49 AM  Result Value Ref Range Status   Specimen Description BLOOD LEFT ANTECUBITAL  Final   Special Requests IN PEDIATRIC BOTTLE  3CC  Final   Culture NO GROWTH < 24 HOURS  Final   Report Status PENDING  Incomplete     Scheduled Meds: . aspirin EC  81 mg Oral Daily  . atorvastatin  80 mg Oral QPM  . ciprofloxacin  500 mg Oral BID  . feeding supplement (ENSURE ENLIVE)  237 mL Oral BID BM  . Influenza vac split quadrivalent PF  0.5 mL Intramuscular Tomorrow-1000  . magic mouthwash w/lidocaine  5 mL Oral TID  . mouth rinse  15 mL Mouth Rinse BID  . polyethylene glycol  17 g Oral Daily  . senna  1 tablet Oral Daily   Continuous Infusions:   Procedures/Studies: Dg Chest 2 View  Result Date: 01/13/2016 CLINICAL DATA:  She 82 year old male with fever and fatigue EXAM: CHEST  2 VIEW COMPARISON:  Chest radiograph dated 10/13/2015 and chest radiograph dated 12/04/2014 FINDINGS: There bibasilar platelike atelectasis/ scarring. Infiltrate is less likely  but not excluded. There is no pleural effusion or pneumothorax. The cardiac silhouette is within normal limits. Median sternotomy wires and CABG vascular clips noted. There is osteopenia with degenerative changes of the spine. Mid thoracic compression deformities similar to prior radiograph. No acute fracture. IMPRESSION: Bibasilar platelike atelectasis/scarring. Infiltrate is less likely. Clinical correlation is recommended. Electronically Signed   By: Anner Crete M.D.   On: 01/13/2016 00:11   US Abdomen Port  Result Date: 01/14/2016 CLINICAL  DATA:  Cholecystectomy.  Elevated liver function tests . EXAM: ABDOMEN ULTRASOUND COMPLETE COMPARISON:  CT 12/10/2013.  MRI 12/21/2012. FINDINGS: Gallbladder: Cholecystectomy. Common bile duct: Diameter: 5.3 mm Liver: Heterogeneous parenchymal pattern. Mild intrahepatic biliary ductal dilatation cannot be excluded. IVC: No abnormality visualized. Pancreas: Difficult to visualize. Pancreatic duct is again noted to be slightly prominent 3.9 mm . Spleen: Size and appearance within normal limits. Right Kidney: Length: 12.7 cm. Echogenicity within normal limits. No hydronephrosis visualized. 5.2 x 5.0 x 4.9 cm simple cyst right kidney. Left Kidney: Length: 12.8 cm. Echogenicity within normal limits. No mass or hydronephrosis visualized. Abdominal aorta: No aneurysm visualized. Other findings: None. IMPRESSION: 1. Cholecystectomy. 2. Heterogeneous hepatic parenchymal pattern. Mild intrahepatic biliary ductal dilatation cannot be excluded. 3. Pancreas is difficult to visualize. Pancreatic duct is again noted to be slightly prominent at 3.9 mm. If symptoms persist MRCP can be obtained for further evaluation. Electronically Signed   By: Marcello Moores  Register   On: 01/14/2016 16:03   Dg Chest Port 1 View  Result Date: 01/15/2016 CLINICAL DATA:  Respiratory failure. EXAM: PORTABLE CHEST 1 VIEW COMPARISON:  01/14/2016 . FINDINGS: Prior CABG. Cardiomegaly with mild pulmonary venous congestion and mild interstitial prominence. Slight improvement from prior exam. Persistent atelectasis left lung base. Persistent small left pleural effusion. IMPRESSION: 1. Prior CABG. Interim slight improvement of congestive heart failure and interstitial edema. Persistent small left pleural effusion. 2.  Persistent atelectasis left lung base. Electronically Signed   By: Marcello Moores  Register   On: 01/15/2016 07:53   Dg Chest Port 1 View  Result Date: 01/14/2016 CLINICAL DATA:  Sepsis.  Hypoxia. EXAM: PORTABLE CHEST 1 VIEW COMPARISON:  01/12/2016.  FINDINGS: Prior CABG. Borderline cardiomegaly. Mild pulmonary venous congestion and interstitial prominence with left-sided pleural effusion. Findings suggest mild congestive heart failure. Left base atelectasis. No pneumothorax. IMPRESSION: 1. Prior CABG. Mild pulmonary venous congestion and interstitial prominence with small left pleural effusion consistent mild congestive heart failure. 2. Mild left base atelectasis. Electronically Signed   By: [place]   On: 01/14/2016 10:51    Joslynne Klatt, DO  [facility] Pager [phone]  If 7PM-7AM, please contact night-coverage [email or web address] Password [email or web address] 01/20/2016, 1:04 PM   LOS: 6 days

## 2016-01-20 NOTE — Clinical Social Work Note (Signed)
Clinical Social Work Assessment  Patient Details  Name: Ronald [name] MRN: [id number] Date of Birth: June 03, 1927  Date of referral:  01/20/16               Reason for consult:  Facility Placement                Permission sought to share information with:  Family Supports Permission granted to share information::  Yes, Verbal Permission Granted  Name::     Museum/gallery curator::  SNF  Relationship::  wife  Contact Information:     Housing/Transportation Living arrangements for the past 2 months:  Single Family Home Source of Information:  Patient, Spouse Patient Interpreter Needed:  None Criminal Activity/Legal Involvement Pertinent to Current Situation/Hospitalization:  No - Comment as needed Significant Relationships:  Spouse, Adult Children Lives with:  Spouse Do you feel safe going back to the place where you live?  Yes Need for family participation in patient care:  No (Coment)  Care giving concerns:  Pt lives at home with elderly wife who is unable to provide much physical assistance at home.   Social Worker assessment / plan: CSW spoke with pt and pt wife at bedside about PT recommendation for rehab stay prior to return home.  Pt and wife unsure about recommendation and report that pt comes to the hospital with same diagnosis every couple of months. Wife states that usually when this happens pt will recovery mobility after a few days in the hospital with PT seeing while inpatient.  CSW discussed CIR consult that had been placed and that SNF would be another option- explained SNF and SNF referral process.  Employment status:  Retired Health visitor PT Recommendations:  Inpatient [place] / Referral to community resources:  [place]  Patient/Family's Response to care:  Pt and wife hesitant to make decision since pt usually able to go home with home health after admissions but is agreeable to CSW beginning bed search in case SNF is  needed.  Patient/Family's Understanding of and Emotional Response to Diagnosis, Current Treatment, and Prognosis:  No questions or concerns at this time pt and wife very familiar with pt current condition and expected prognosis.  Emotional Assessment Appearance:  Appears stated age Attitude/Demeanor/Rapport:    Affect (typically observed):  Accepting, Appropriate Orientation:  Oriented to Self, Oriented to Place, Oriented to  Time, Oriented to Situation Alcohol / Substance use:  Not Applicable Psych involvement (Current and /or in the community):  No (Comment)  Discharge Needs  Concerns to be addressed:  Care Coordination Readmission within the last 30 days:  No Current discharge risk:  Physical Impairment Barriers to Discharge:  Continued Medical Work up   Jorge Ny, LCSW 01/20/2016, 10:38 AM

## 2016-01-20 NOTE — Consult Note (Signed)
Physical Medicine and Rehabilitation Consult  Reason for Consult: Debility Referring Physician: Dr. Carles Collet   HPI: Ronald [name] is a 82 y.o. male with history of Bell's palsy, GERD, CAD s/p CABG/ stents (LVEF 45%), HTN, prostate cancer, urethral stricture with chronic UTIs and SPC with recent treatment for UTI. He was admitted on 01/14/16 with altered mental status, abdominal pain and hypotension due to urosepsis.  He was started on pressors and IV antibiotics and SPC changed out by urology. Blood cultures positive for citrobacter Braaki and Pseudomonas Aeruginosa. Cardiology consulted for elevated troponin's and ST changes felt to be due to demand ischemia.  2 D echo done for work up and showed EF 40-45% with anterior, apical and basal to mid inferior wall hypokinesis. Patient asymptomatic and cardiac cath deferred due to thrombocytopenia as well as acute renal failure. He was treated medically and antibiotics adjusted for better coverage. Repeat BC pending. PT evaluation done yesterday and CIR recommended due to debility.   He was independent without AD. Manages his yard.  Does strengthening exercises at home and walks about twice a day (14 minutes on treadmill/quater of mile). Wife supportive and open to therapy in hospital if needed.   Review of Systems  HENT: Positive for hearing loss.   Eyes: Negative for blurred vision and double vision.  Respiratory: Positive for cough and shortness of breath.   Cardiovascular: Negative for chest pain and palpitations.  Gastrointestinal: Positive for diarrhea and heartburn. Negative for abdominal pain.  Genitourinary: Negative for dysuria.  Musculoskeletal: Positive for falls and myalgias.  Skin: Negative for rash.  Neurological: Negative for dizziness, tremors, focal weakness and headaches.  Psychiatric/Behavioral: The patient is not nervous/anxious.   All other systems reviewed and are negative.     Past Medical History:  Diagnosis Date    . Arthritis    "joints ache" (01/02/2014)  . At risk for sleep apnea    STOP-BANG= 4    SENT TO PCP 12-23-2013  . Bladder calculi   . CHF (congestive heart failure) ([place])   . Chronic cystitis   . Coronary artery disease CARDIOLOGIST-  DR Grace Bushy  MI -- S/P  11/89 CABG x6 (70% circ; 90% PD; 60-70% distal left main; 30% left circ; 90% 1st diag;, 2nd diag and 3rd diag. w/90%,  mild stenosis LAD and 60-70% pLAD)  Re-do CABG x5 in 1997  . Diverticulosis   . GERD (gastroesophageal reflux disease)   . History of Bell's palsy    RIGHT SIDE-- NO RESIDUAL  . Hx of dizziness   . Hyperlipidemia   . Hypertension    "not anymore" (01/02/2014)  . Ischemic cardiomyopathy    ef 35-40% per cath 08-28-2013  . Kidney stones "years ago"   "passed them"  . Melanoma of ear ([place])    "right"  . Mild dementia   . Myocardial infarction 1986; 1997  . Nocturia   . OAB (overactive bladder)   . Prostate cancer ([place]) 1998   S/P  [place]; [place]  . S/P CABG (coronary artery bypass graft)    [place]  . Skipped heart beats    occasional  . Urethral stricture   . UTI (urinary tract infection) 09/2015  . Wears hearing aid    bilateral  . Wears partial dentures     Past Surgical History:  Procedure Laterality Date  . CARDIAC CATHETERIZATION  02-22-2005  dr Vidal Schwalbe  mild to moderate lv dysfunction with inferobasilar akinesis/  totally occluded SVG to Intermediate Diagonal and SVG to PDA and RCA branches, totally occluded native coronary circulation with diffuse disease pLAD diagonal system with potentially could be ischemic/  patent SVG to OM with collaterals to dRCA and patent LIMA to LAD and diagonal systemss  . CARDIAC CATHETERIZATION  08-28-2013  DR Daneen Schick   widely patent sequential left internal graft to the diagonal/LAD, widely patent SVG to OM with proximal 50% narrowing noted in the graft, total occlusion SVG's to RCA, RI, and the Diagonal/ LV  dysfunction with inferobasal aneurysm and mid anterior wall region of akinesis/  overall EF 35-40%/  Total occlusion of the navtive circulation/  no significant change compared to 2006 cath  . CARDIOVASCULAR STRESS TEST  08-01-2011  dr Angelena Form   inferior scar and possible soft tissue attenuation with minimal peri-infarct ischemia, small region of anterior ischemia and scar/  LVEF 53% LV wall motion with inferior hypokinesis/ no significant change from scan july 2011  . CATARACT EXTRACTION W/ INTRAOCULAR LENS  IMPLANT, BILATERAL Bilateral 2007  . CORONARY ARTERY BYPASS GRAFT  11/ [place]-- 6 vessel/  1997 Re-do 5 vessel  . CYSTOSCOPY WITH URETHRAL DILATATION N/A 12/25/2013   Procedure: CYSTOSCOPY WITH URETHRAL DILATATION, WITH BIOPSY;  Surgeon: Bernestine Amass, MD;  Location: [facility];  Service: Urology;  Laterality: N/A;  . CYSTOSCOPY WITH URETHRAL DILATATION N/A 12/22/2014   Procedure: CYSTOSCOPY WITH URETHRAL DILATATION;  Surgeon: Rana Snare, MD;  Location: [facility];  Service: Urology;  Laterality: N/A;  BALLOON DILATION CATHETER    . EUS  10/05/2011   Procedure: ESOPHAGEAL ENDOSCOPIC ULTRASOUND (EUS) RADIAL;  Surgeon: Arta Silence, MD;  Location: [facility];  Service: Endoscopy;  Laterality: N/A;  . INSERTION OF SUPRAPUBIC CATHETER N/A 12/22/2014   Procedure: INSERTION OF SUPRAPUBIC CATHETER ;  Surgeon: Rana Snare, MD;  Location: [facility];  Service: Urology;  Laterality: N/A;  . LAPAROSCOPIC CHOLECYSTECTOMY  12-23-2005  . LEFT HEART CATHETERIZATION WITH CORONARY/GRAFT ANGIOGRAM N/A 08/28/2013   Procedure: LEFT HEART CATHETERIZATION WITH Beatrix Fetters;  Surgeon: Sinclair Grooms, MD;  Location: [facility] C[facility];  Service: Cardiovascular;  Laterality: N/A;  . MELANOMA EXCISION  X 1   "ear"  . RADIOACTIVE PROSTATE SEED IMPLANTS  1998  . SKIN CANCER EXCISION  X 2   "top of head"  . TONSILLECTOMY AND ADENOIDECTOMY  1954    Family History  Problem  Relation Age of Onset  . Heart disease Mother   . Diabetes Father   . Heart disease Brother     Social History:  Married--wife supportive and can provide supervision after discharge.  Per reports he quit smoking about 32 years ago. His smoking use included Cigars. He quit after 40.00 years of use. He quit smokeless tobacco use about 2 years ago. His smokeless tobacco use included Chew. He reports that he does not drink alcohol or use drugs.   Allergies  Allergen Reactions  . Pantoprazole Other (See Comments)    Headache and lightheaded  . Nitrofurantoin Hives  . Sulfa Antibiotics Hives and Itching   Medications Prior to Admission  Medication Sig Dispense Refill  . acetaminophen (TYLENOL) 325 MG tablet Take 325 mg by mouth every 4 (four) hours as needed for fever.    Marland Kitchen acetaminophen (TYLENOL) 500 MG tablet Take 500 mg by mouth every 6 (six) hours as needed for pain or fever.     Marland Kitchen  aspirin EC 81 MG EC tablet Take 1 tablet (81 mg total) by mouth daily. 30 tablet 0  . atorvastatin (LIPITOR) 80 MG tablet Take 40 mg by mouth every evening.    . cephALEXin (KEFLEX) 500 MG capsule Take 1 capsule (500 mg total) by mouth 2 (two) times daily. 14 capsule 0  . Cholecalciferol (VITAMIN D) 2000 UNITS tablet Take 2,000 Units by mouth daily.    . flavoxATE (URISPAS) 100 MG tablet Take 100 mg by mouth 3 (three) times daily as needed for bladder spasms.    Marland Kitchen galantamine (RAZADYNE ER) 8 MG 24 hr capsule Take 8 mg by mouth daily with breakfast.    . HYDROcodone-acetaminophen (NORCO/VICODIN) 5-325 MG tablet Take 1 tablet by mouth every 6 (six) hours as needed for moderate pain.    . hydroxypropyl methylcellulose (ISOPTO TEARS) 2.5 % ophthalmic solution Place 1 drop into both eyes 2 (two) times daily.     . isosorbide mononitrate (IMDUR) 60 MG 24 hr tablet Take 0.5 tablets (30 mg total) by mouth every morning.    . Multiple Vitamin (MULTIVITAMIN WITH MINERALS) TABS Take 1 tablet by mouth daily.    .  nitroGLYCERIN (NITROSTAT) 0.4 MG SL tablet Place 0.4 mg under the tongue every 5 (five) minutes as needed for chest pain.       Home: Home Living Family/patient expects to be discharged to:: Private residence Living Arrangements: Spouse/significant other Available Help at Discharge: Family, Available 24 hours/day (children also local) Type of Home: House Home Access: Stairs to enter [employer] of Steps: 3 Entrance Stairs-Rails: Right Home Layout: One level Bathroom Shower/Tub: Tub/shower unit, Architectural technologist: Handicapped height Bathroom Accessibility: Yes Home Equipment: [employer] - single point, Shower seat, Grab bars - tub/shower, Grab bars - toilet, Environmental consultant - 2 wheels  Functional History: Prior Function Level of Independence: Independent Comments: drives, grocery shops, gardens Functional Status:  Mobility: Bed Mobility Overal bed mobility: Needs Assistance Bed Mobility: Rolling, Sidelying to Sit, Sit to Supine Rolling: Min assist Sidelying to sit: Mod assist, HOB elevated Sit to supine: Min assist General bed mobility comments: with rail, HOB elevated to exit; weak UEs and torso required assist; assist to legs to return to supine Transfers Overall transfer level: Needs assistance Equipment used: 1 person hand held assist Transfers: Sit to/from Stand Sit to Stand: Mod assist General transfer comment: significant assist off toilet; less from raised surface Ambulation/Gait Ambulation/Gait assistance: Mod assist Ambulation Distance (Feet): 35 Feet (toileted, 10 ft) Assistive device: 1 person hand held assist Gait Pattern/deviations: Step-through pattern, Decreased stride length, Narrow base of support, Trunk flexed General Gait Details: Pt became increasingly fatigued (and rushing due to fecal incontinence) on return to room/bathroom with increased physical assist. After seated for toileting and "clean up" x 10 minutes, pt very fatigued with walk back to bed  with unsafe reach and turn to sit at foot of bed as approaching.     ADL:    Cognition: Cognition Overall Cognitive Status: Within Functional Limits for tasks assessed Cognition Arousal/Alertness: Awake/alert Behavior During Therapy: WFL for tasks assessed/performed Overall Cognitive Status: Within Functional Limits for tasks assessed  Blood pressure 132/62, pulse 66, temperature 98.5 F (36.9 C), temperature source Oral, resp. rate 20, height 5\' 5"  (1.651 m), weight 75.4 kg (166 lb 3.6 oz), SpO2 95 %. Physical Exam  Nursing note and vitals reviewed. Constitutional: He is oriented to person, place, and time. He appears well-developed and well-nourished.  HENT:  Head: Normocephalic and atraumatic.  Poor  dentition.   Eyes: Conjunctivae are normal. Pupils are equal, round, and reactive to light.  Neck: Normal range of motion. Neck supple.  Cardiovascular: Normal rate and regular rhythm.   Murmur heard. Respiratory: Effort normal and breath sounds normal. No stridor. No respiratory distress. He has no wheezes.  GI: Soft. Bowel sounds are normal. He exhibits no distension. There is no tenderness.  Musculoskeletal: Normal range of motion. He exhibits no edema or tenderness.  Neurological: He is alert and oriented to person, place, and time.  HOH.  Speech clear.  Motor: 4+/5 proximally, 5/5 distally throughout Sensation intact to light touch  Skin: Skin is warm and dry. No erythema.  Psychiatric: He has a normal mood and affect. His behavior is normal.    Results for orders placed or performed during the hospital encounter of 01/14/16 (from the past 24 hour(s))  Magnesium     Status: None   Collection Time: 01/20/16  7:04 AM  Result Value Ref Range   Magnesium 1.9 1.7 - 2.4 mg/dL  CBC     Status: Abnormal   Collection Time: 01/20/16  7:04 AM  Result Value Ref Range   WBC 13.6 (H) 4.0 - 10.5 K/uL   RBC 4.35 4.22 - 5.81 MIL/uL   Hemoglobin 13.2 13.0 - 17.0 g/dL   HCT 39.2 39.0  - 52.0 %   MCV 90.1 78.0 - 100.0 fL   MCH 30.3 26.0 - 34.0 pg   MCHC 33.7 30.0 - 36.0 g/dL   RDW 14.5 11.5 - 15.5 %   Platelets 126 (L) 150 - 400 K/uL  Renal function panel     Status: Abnormal   Collection Time: 01/20/16  7:04 AM  Result Value Ref Range   Sodium 138 135 - 145 mmol/L   Potassium 4.0 3.5 - 5.1 mmol/L   Chloride 105 101 - 111 mmol/L   CO2 25 22 - 32 mmol/L   Glucose, Bld 119 (H) 65 - 99 mg/dL   BUN 21 (H) 6 - 20 mg/dL   Creatinine, Ser 0.90 0.61 - 1.24 mg/dL   Calcium 9.0 8.9 - 10.3 mg/dL   Phosphorus 3.3 2.5 - 4.6 mg/dL   Albumin 2.4 (L) 3.5 - 5.0 g/dL   GFR calc non Af Amer >60 >60 mL/min   GFR calc Af Amer >60 >60 mL/min   Anion gap 8 5 - 15   No results found.  Assessment/Plan: Diagnosis: Debility Labs and images independently reviewed.  Records reviewed and summated.  1. Does the need for close, 24 hr/day medical supervision in concert with the patient's rehab needs make it unreasonable for this patient to be served in a less intensive setting? Yes  2. Co-Morbidities requiring supervision/potential complications: Bell's palsy, GERD (cont meds), CAD s/p CABG/ stents (cont meds) HTN (monitor and provide prns in accordance with increased physical exertion and pain), prostate cancer (monitor), urethral stricture with chronic UTIs and SPC (recs per Urology), CHF (Monitor in accordance with increased physical activity and avoid UE resistance excercises), thrombocytopenia (Thrombocytopenia < 60,000/mm3 no resistive), bradycardia (monitor HR with increased physical activity), hypoalbuminemia (consider supplementation), leukocytosis (cont to monitor for signs and symptoms of infection, further workup if indicated), fecal incontinence (monitor, workup if persistent) 3. Due to bladder management, bowel management, safety, skin/wound care, disease management and patient education, does the patient require 24 hr/day rehab nursing? Yes 4. Does the patient require coordinated  care of a physician, rehab nurse, PT (1-2 hrs/day, 5 days/week) and OT (1-2 hrs/day, 5 days/week)  to address physical and functional deficits in the context of the above medical diagnosis(es)? Yes Addressing deficits in the following areas: balance, endurance, locomotion, strength, transferring, bowel/bladder control, bathing, dressing, toileting and psychosocial support 5. Can the patient actively participate in an intensive therapy program of at least 3 hrs of therapy per day at least 5 days per week? Yes 6. The potential for patient to make measurable gains while on inpatient rehab is excellent 7. Anticipated functional outcomes upon discharge from inpatient rehab are modified independent and supervision  with PT, modified independent and supervision with OT, n/a with SLP. 8. Estimated rehab length of stay to reach the above functional goals is: 12-17 days. 9. Does the patient have adequate social supports and living environment to accommodate these discharge functional goals? Yes 10. Anticipated D/C setting: Home 11. Anticipated post D/C treatments: HH therapy and Home excercise program 12. Overall Rehab/Functional Prognosis: good  RECOMMENDATIONS: This patient's condition is appropriate for continued rehabilitative care in the following setting: CIR after completion of medical workup  Patient has agreed to participate in recommended program. Yes Note that insurance prior authorization may be required for reimbursement for recommended care.  Comment: Rehab Admissions Coordinator to follow up.  Delice Lesch, MD, Mellody Drown 01/20/2016

## 2016-01-20 NOTE — NC FL2 (Signed)
[place] LEVEL OF CARE SCREENING TOOL     IDENTIFICATION  Patient Name: Ronald [name] Birthdate: 09-08-1927 Sex: male Admission Date (Current Location): 01/14/2016  [facility] and [place] Number:  Herbalist and Address:  [facility], [place] [address], [place], [place] [address]      Provider Number: [id number]  Attending Physician Name and Address:  Orson Eva, MD  Relative Name and Phone Number:       Current Level of Care: Hospital Recommended Level of Care: [place] Prior Approval Number:    Date Approved/Denied:   PASRR Number:   [id number]   Discharge Plan: SNF    Current Diagnoses: Patient Active Problem List   Diagnosis Date Noted  . Bacteremia   . Abdominal pain   . NSTEMI (non-ST elevated myocardial infarction) ([place]) 01/16/2016  . Altered mental state 01/14/2016  . Septic shock ([place])   . Generalized weakness 10/13/2015  . ARF (acute renal failure) ([place]) 10/13/2015  . Sepsis ([place]) 12/05/2014  . UTI (urinary tract infection) 10/03/2014  . UTI (lower urinary tract infection) 09/10/2014  . Fever 09/10/2014  . Urinary frequency 06/30/2014  . Bacteremia due to Enterococcus 01/22/2014  . SIRS (systemic inflammatory response syndrome) ([place]) 08-15-202015  . Chronic systolic CHF (congestive heart failure) ([place]) 08-15-202015  . Urethral stricture 12/25/2013  . Bladder calculus 12/25/2013  . LV dysfunction 09/23/2013  . Chest pain 08/27/2013  . Unstable angina ([place]) 08/27/2013  . Bacteremia due to Escherichia coli 05/20/2012  . Urinary tract infection 09/15/2011  . Calculus of kidney 09/12/2011  . Bradycardia 10/26/2010  . CHEST WALL PAIN, ACUTE 09/04/2009  . CHILLS WITHOUT FEVER 02/10/2009  . HYPERCHOLESTEROLEMIA 10/18/2007  . HTN (hypertension) 10/18/2007  . CELLULITIS, FOOT, LEFT 02/27/2007  . OSTEOARTHRITIS 02/07/2007  . ABDOMINAL PAIN 01/01/2007  . BELL'S PALSY, RIGHT 10/26/2006  . Coronary  atherosclerosis 10/26/2006  . PROSTATE CANCER, HX OF 10/26/2006  . NEPHROLITHIASIS, HX OF 10/26/2006    Orientation RESPIRATION BLADDER Height & Weight     Self, Time, Situation, Place  Normal Incontinent, Indwelling catheter Weight: 166 lb 3.6 oz (75.4 kg) Height:  5\' 5"  (165.1 cm)  BEHAVIORAL SYMPTOMS/MOOD NEUROLOGICAL BOWEL NUTRITION STATUS      Continent Diet (cardiac)  AMBULATORY STATUS COMMUNICATION OF NEEDS Skin   Extensive Assist Verbally Normal                       Personal Care Assistance Level of Assistance  Bathing, Dressing Bathing Assistance: Limited assistance   Dressing Assistance: Limited assistance     Functional Limitations Info             SPECIAL CARE FACTORS FREQUENCY  PT (By licensed PT), OT (By licensed OT)     PT Frequency: 5/wk OT Frequency: 5/wk            Contractures      Additional Factors Info  Code Status, Allergies Code Status Info: FULL Allergies Info: Pantoprazole, Nitrofurantoin, Sulfa Antibiotics           Current Medications (01/20/2016):  This is the current hospital active medication list Current Facility-Administered Medications  Medication Dose Route Frequency Provider Last Rate Last Dose  . 0.9 %  sodium chloride infusion  250 mL Intravenous PRN Marijean Heath, NP      . acetaminophen (TYLENOL) tablet 650 mg  650 mg Oral Q6H PRN Doreatha Lew, MD   650 mg at 01/20/16  7517  . albuterol (PROVENTIL) (2.5 MG/3ML) 0.083% nebulizer solution 2.5 mg  2.5 mg Nebulization Q2H PRN Marijean Heath, NP      . aspirin EC tablet 81 mg  81 mg Oral Daily Pixie Casino, MD   81 mg at 01/20/16 0906  . atorvastatin (LIPITOR) tablet 80 mg  80 mg Oral QPM Pixie Casino, MD   80 mg at 01/19/16 1849  . ciprofloxacin (CIPRO) tablet 500 mg  500 mg Oral BID Doreatha Lew, MD   500 mg at 01/20/16 0017  . docusate sodium (COLACE) capsule 100 mg  100 mg Oral BID PRN Brand Males, MD      . feeding supplement  (ENSURE ENLIVE) (ENSURE ENLIVE) liquid 237 mL  237 mL Oral BID BM Waldemar Dickens, MD   237 mL at 01/20/16 0906  . Influenza vac split quadrivalent PF (FLUARIX) injection 0.5 mL  0.5 mL Intramuscular Tomorrow-1000 Doreatha Lew, MD      . magic mouthwash w/lidocaine  5 mL Oral TID Doreatha Lew, MD   5 mL at 01/20/16 0906  . MEDLINE mouth rinse  15 mL Mouth Rinse BID Chesley Mires, MD   15 mL at 01/20/16 0907  . polyethylene glycol (MIRALAX / GLYCOLAX) packet 17 g  17 g Oral Daily Doreatha Lew, MD   17 g at 01/20/16 0906  . polyvinyl alcohol (LIQUIFILM TEARS) 1.4 % ophthalmic solution 1 drop  1 drop Both Eyes PRN Rogue Bussing, MD   1 drop at 01/15/16 1004  . senna (SENOKOT) tablet 8.6 mg  1 tablet Oral Daily Doreatha Lew, MD   8.6 mg at 01/20/16 4944     Discharge Medications: Please see discharge summary for a list of discharge medications.  Relevant Imaging Results:  Relevant Lab Results:   Additional Information SS#: [id number]  Jorge Ny, LCSW

## 2016-01-21 ENCOUNTER — Encounter (HOSPITAL_COMMUNITY): Payer: Self-pay

## 2016-01-21 ENCOUNTER — Inpatient Hospital Stay (HOSPITAL_COMMUNITY)
Admission: RE | Admit: 2016-01-21 | Discharge: 2016-01-29 | DRG: 947 | Disposition: A | Discharge status: Home Health | Payer: Medicare Other | Source: Intra-hospital | Attending: Physical Medicine & Rehabilitation | Admitting: Physical Medicine & Rehabilitation

## 2016-01-21 DIAGNOSIS — E8809 Other disorders of plasma-protein metabolism, not elsewhere classified: Secondary | ICD-10-CM | POA: Diagnosis not present

## 2016-01-21 DIAGNOSIS — I214 Non-ST elevation (NSTEMI) myocardial infarction: Secondary | ICD-10-CM | POA: Diagnosis present

## 2016-01-21 DIAGNOSIS — Z974 Presence of external hearing-aid: Secondary | ICD-10-CM

## 2016-01-21 DIAGNOSIS — D696 Thrombocytopenia, unspecified: Secondary | ICD-10-CM | POA: Diagnosis present

## 2016-01-21 DIAGNOSIS — Z888 Allergy status to other drugs, medicaments and biological substances status: Secondary | ICD-10-CM

## 2016-01-21 DIAGNOSIS — N359 Urethral stricture, unspecified: Secondary | ICD-10-CM | POA: Diagnosis present

## 2016-01-21 DIAGNOSIS — Z7982 Long term (current) use of aspirin: Secondary | ICD-10-CM

## 2016-01-21 DIAGNOSIS — R001 Bradycardia, unspecified: Secondary | ICD-10-CM | POA: Diagnosis not present

## 2016-01-21 DIAGNOSIS — K219 Gastro-esophageal reflux disease without esophagitis: Secondary | ICD-10-CM | POA: Diagnosis present

## 2016-01-21 DIAGNOSIS — Z951 Presence of aortocoronary bypass graft: Secondary | ICD-10-CM | POA: Diagnosis not present

## 2016-01-21 DIAGNOSIS — N179 Acute kidney failure, unspecified: Secondary | ICD-10-CM | POA: Diagnosis not present

## 2016-01-21 DIAGNOSIS — N182 Chronic kidney disease, stage 2 (mild): Secondary | ICD-10-CM | POA: Diagnosis not present

## 2016-01-21 DIAGNOSIS — D709 Neutropenia, unspecified: Secondary | ICD-10-CM | POA: Diagnosis present

## 2016-01-21 DIAGNOSIS — R7881 Bacteremia: Secondary | ICD-10-CM | POA: Diagnosis present

## 2016-01-21 DIAGNOSIS — N183 Chronic kidney disease, stage 3 unspecified: Secondary | ICD-10-CM

## 2016-01-21 DIAGNOSIS — Z955 Presence of coronary angioplasty implant and graft: Secondary | ICD-10-CM | POA: Diagnosis not present

## 2016-01-21 DIAGNOSIS — Z8546 Personal history of malignant neoplasm of prostate: Secondary | ICD-10-CM | POA: Diagnosis not present

## 2016-01-21 DIAGNOSIS — R220 Localized swelling, mass and lump, head: Secondary | ICD-10-CM | POA: Diagnosis not present

## 2016-01-21 DIAGNOSIS — Z8249 Family history of ischemic heart disease and other diseases of the circulatory system: Secondary | ICD-10-CM

## 2016-01-21 DIAGNOSIS — B965 Pseudomonas (aeruginosa) (mallei) (pseudomallei) as the cause of diseases classified elsewhere: Secondary | ICD-10-CM

## 2016-01-21 DIAGNOSIS — R5381 Other malaise: Principal | ICD-10-CM | POA: Diagnosis present

## 2016-01-21 DIAGNOSIS — Z881 Allergy status to other antibiotic agents status: Secondary | ICD-10-CM

## 2016-01-21 DIAGNOSIS — K1379 Other lesions of oral mucosa: Secondary | ICD-10-CM

## 2016-01-21 DIAGNOSIS — N3020 Other chronic cystitis without hematuria: Secondary | ICD-10-CM | POA: Diagnosis present

## 2016-01-21 DIAGNOSIS — Z87891 Personal history of nicotine dependence: Secondary | ICD-10-CM

## 2016-01-21 DIAGNOSIS — I2510 Atherosclerotic heart disease of native coronary artery without angina pectoris: Secondary | ICD-10-CM | POA: Diagnosis present

## 2016-01-21 DIAGNOSIS — I129 Hypertensive chronic kidney disease with stage 1 through stage 4 chronic kidney disease, or unspecified chronic kidney disease: Secondary | ICD-10-CM | POA: Diagnosis present

## 2016-01-21 DIAGNOSIS — Z961 Presence of intraocular lens: Secondary | ICD-10-CM | POA: Diagnosis present

## 2016-01-21 DIAGNOSIS — F0390 Unspecified dementia without behavioral disturbance: Secondary | ICD-10-CM | POA: Diagnosis present

## 2016-01-21 DIAGNOSIS — N390 Urinary tract infection, site not specified: Secondary | ICD-10-CM | POA: Diagnosis not present

## 2016-01-21 DIAGNOSIS — Z79899 Other long term (current) drug therapy: Secondary | ICD-10-CM

## 2016-01-21 DIAGNOSIS — B9620 Unspecified Escherichia coli [E. coli] as the cause of diseases classified elsewhere: Secondary | ICD-10-CM

## 2016-01-21 DIAGNOSIS — B370 Candidal stomatitis: Secondary | ICD-10-CM | POA: Diagnosis present

## 2016-01-21 DIAGNOSIS — Z833 Family history of diabetes mellitus: Secondary | ICD-10-CM

## 2016-01-21 DIAGNOSIS — E785 Hyperlipidemia, unspecified: Secondary | ICD-10-CM | POA: Diagnosis present

## 2016-01-21 DIAGNOSIS — Z85820 Personal history of malignant melanoma of skin: Secondary | ICD-10-CM | POA: Diagnosis not present

## 2016-01-21 DIAGNOSIS — I255 Ischemic cardiomyopathy: Secondary | ICD-10-CM | POA: Diagnosis present

## 2016-01-21 DIAGNOSIS — T83510A Infection and inflammatory reaction due to cystostomy catheter, initial encounter: Secondary | ICD-10-CM

## 2016-01-21 DIAGNOSIS — D72820 Lymphocytosis (symptomatic): Secondary | ICD-10-CM | POA: Diagnosis not present

## 2016-01-21 LAB — CBC
HCT: 41.6 % (ref 39.0–52.0)
Hemoglobin: 14 g/dL (ref 13.0–17.0)
MCH: 30.8 pg (ref 26.0–34.0)
MCHC: 33.7 g/dL (ref 30.0–36.0)
MCV: 91.4 fL (ref 78.0–100.0)
Platelets: 86 10*3/uL — ABNORMAL LOW (ref 150–400)
RBC: 4.55 MIL/uL (ref 4.22–5.81)
RDW: 14.6 % (ref 11.5–15.5)
WBC: 12.7 10*3/uL — ABNORMAL HIGH (ref 4.0–10.5)

## 2016-01-21 LAB — MAGNESIUM: Magnesium: 2 mg/dL (ref 1.7–2.4)

## 2016-01-21 MED ORDER — FLEET ENEMA 7-19 GM/118ML RE ENEM: 1.0000 | ENEMA | Freq: Once | RECTAL | Status: DC | PRN

## 2016-01-21 MED ORDER — PROCHLORPERAZINE EDISYLATE 5 MG/ML IJ SOLN: 5.0000 mg | Freq: Four times a day (QID) | INTRAMUSCULAR | Status: DC | PRN

## 2016-01-21 MED ORDER — TRAZODONE HCL 50 MG PO TABS
25.0000 mg | ORAL_TABLET | Freq: Every evening | ORAL | Status: DC | PRN
  Administered 2016-01-25: 50 mg via ORAL
  Filled 2016-01-21 (×2): qty 1

## 2016-01-21 MED ORDER — POLYETHYLENE GLYCOL 3350 17 G PO PACK: 17.0000 g | PACK | Freq: Every day | ORAL | Status: DC | PRN

## 2016-01-21 MED ORDER — PROCHLORPERAZINE 25 MG RE SUPP: 12.5000 mg | Freq: Four times a day (QID) | RECTAL | Status: DC | PRN

## 2016-01-21 MED ORDER — CIPROFLOXACIN HCL 500 MG PO TABS: 500.0000 mg | ORAL_TABLET | Freq: Two times a day (BID) | ORAL | 0 refills | Status: DC

## 2016-01-21 MED ORDER — LIDOCAINE VISCOUS 2 % MT SOLN
15.0000 mL | Freq: Four times a day (QID) | OROMUCOSAL | Status: DC | PRN
  Filled 2016-01-21 (×2): qty 15

## 2016-01-21 MED ORDER — ENSURE ENLIVE PO LIQD
237.0000 mL | Freq: Two times a day (BID) | ORAL | Status: DC
  Administered 2016-01-22 – 2016-01-24 (×6): 237 mL via ORAL

## 2016-01-21 MED ORDER — ALBUTEROL SULFATE (2.5 MG/3ML) 0.083% IN NEBU: 2.5000 mg | INHALATION_SOLUTION | RESPIRATORY_TRACT | Status: DC | PRN

## 2016-01-21 MED ORDER — DIPHENHYDRAMINE HCL 12.5 MG/5ML PO ELIX: 12.5000 mg | ORAL_SOLUTION | Freq: Four times a day (QID) | ORAL | Status: DC | PRN

## 2016-01-21 MED ORDER — SIMETHICONE 40 MG/0.6ML PO SUSP
80.0000 mg | Freq: Four times a day (QID) | ORAL | Status: DC
  Administered 2016-01-21 – 2016-01-29 (×27): 80 mg via ORAL
  Filled 2016-01-21 (×33): qty 1.2

## 2016-01-21 MED ORDER — CIPROFLOXACIN HCL 500 MG PO TABS
500.0000 mg | ORAL_TABLET | Freq: Two times a day (BID) | ORAL | Status: DC
  Administered 2016-01-21 – 2016-01-29 (×16): 500 mg via ORAL
  Filled 2016-01-21 (×16): qty 1

## 2016-01-21 MED ORDER — MAGIC MOUTHWASH W/LIDOCAINE
5.0000 mL | Freq: Three times a day (TID) | ORAL | Status: DC
  Administered 2016-01-21 – 2016-01-23 (×8): 5 mL via ORAL
  Filled 2016-01-21 (×9): qty 5

## 2016-01-21 MED ORDER — ORAL CARE MOUTH RINSE
15.0000 mL | Freq: Two times a day (BID) | OROMUCOSAL | Status: DC
  Administered 2016-01-21 – 2016-01-24 (×5): 15 mL via OROMUCOSAL

## 2016-01-21 MED ORDER — PROCHLORPERAZINE MALEATE 5 MG PO TABS: 5.0000 mg | ORAL_TABLET | Freq: Four times a day (QID) | ORAL | Status: DC | PRN

## 2016-01-21 MED ORDER — ASPIRIN EC 81 MG PO TBEC
81.0000 mg | DELAYED_RELEASE_TABLET | Freq: Every day | ORAL | Status: DC
Start: 2016-01-22 — End: 2016-01-29
  Administered 2016-01-22 – 2016-01-29 (×8): 81 mg via ORAL
  Filled 2016-01-21 (×8): qty 1

## 2016-01-21 MED ORDER — ACETAMINOPHEN 325 MG PO TABS
650.0000 mg | ORAL_TABLET | Freq: Four times a day (QID) | ORAL | Status: DC | PRN
  Administered 2016-01-22 – 2016-01-24 (×2): 650 mg via ORAL
  Filled 2016-01-21 (×2): qty 2

## 2016-01-21 MED ORDER — POLYVINYL ALCOHOL 1.4 % OP SOLN
1.0000 [drp] | OPHTHALMIC | Status: DC | PRN
  Filled 2016-01-21: qty 15

## 2016-01-21 MED ORDER — SIMETHICONE 80 MG PO CHEW
80.0000 mg | CHEWABLE_TABLET | Freq: Four times a day (QID) | ORAL | Status: DC | PRN
  Administered 2016-01-21: 80 mg via ORAL
  Filled 2016-01-21: qty 1

## 2016-01-21 MED ORDER — ATORVASTATIN CALCIUM 80 MG PO TABS: 80.0000 mg | ORAL_TABLET | Freq: Every evening | ORAL | 1 refills | Status: DC

## 2016-01-21 MED ORDER — BISACODYL 10 MG RE SUPP: 10.0000 mg | Freq: Every day | RECTAL | Status: DC | PRN

## 2016-01-21 MED ORDER — GUAIFENESIN-DM 100-10 MG/5ML PO SYRP: 5.0000 mL | ORAL_SOLUTION | Freq: Four times a day (QID) | ORAL | Status: DC | PRN

## 2016-01-21 MED ORDER — ATORVASTATIN CALCIUM 80 MG PO TABS
80.0000 mg | ORAL_TABLET | Freq: Every evening | ORAL | Status: DC
  Administered 2016-01-21 – 2016-01-28 (×8): 80 mg via ORAL
  Filled 2016-01-21 (×8): qty 1

## 2016-01-21 MED ORDER — ALUM & MAG HYDROXIDE-SIMETH 200-200-20 MG/5ML PO SUSP: 30.0000 mL | ORAL | Status: DC | PRN

## 2016-01-21 NOTE — Progress Notes (Signed)
Ronald Gong, RN Rehab Admission Coordinator Signed Physical Medicine and Rehabilitation  PMR Pre-admission Date of Service: 01/21/2016 8:40 AM  Related encounter: ED to Hosp-Admission (Current) from 01/14/2016 in [place]       [] Hide copied text PMR Admission Coordinator Pre-Admission Assessment  Patient: Ronald [name] is an 82 y.o., male MRN: [id number] DOB: 1927/11/19 Height: 5\' 5"  (165.1 cm) Weight: 73.4 kg (161 lb 13.1 oz)                                                                                                                                                  Insurance Information HMO:    PPO:      PCP:      IPA:      80/20: yes     OTHER: no HMO PRIMARY: Medicare a and b      Policy#: [id number]      Subscriber: pt Benefits:  Phone #: passport one online     Name: 01/21/16 Eff. Date:07/27/94     Deduct: $1316      Out of Pocket Max: none      Life Max: none CIR: 100%      SNF: 20 full days Outpatient: 80%     Co-Pay: 20% Home Health: 100%      Co-Pay: none DME: 80%     Co-Pay: 20% Providers: pt choice  SECONDARY: [employer] of [place]      Policy#: [id number]      Subscriber: pt  Medicaid Application Date:       Case Manager:  Disability Application Date:       Case Worker:   Emergency Contact Information        Contact Information    Name Relation Home Work Mobile   Ronald [name],Ronald [name] Spouse [phone]     Ronald [name], Ronald [name]   [phone]   Ronald [name], Ronald [name] Daughter   [phone]     Current Medical History  Patient Admitting Diagnosis: debility  History of Present Illness:  Ronald [name] a 82 y.o.malewith history ofBell's palsy, GERD, CAD s/p CABG/ stents (LVEF 45%), HTN, prostate cancer, urethral stricture with chronic UTIs and SPC with recent treatment for UTI. He was admitted on 10/19/17with altered mental status, abdominal pain and hypotension due to urosepsis. Ultrasound of abdomen done revealing  continued mild prominence of pancreatic duct and question of mild intrahepatic biliary ductal dilatation. He was started on pressors and IV antibiotics and SPC changed out by urology. Blood cultures positive for citrobacter Braaki and Pseudomonas Aeruginosa. Cardiology consulted for elevated troponin's and ST changes felt to be due to demand ischemia. 2 D echo done for work up and showed EF 40-45% with anterior, apical and basal to mid inferior wall hypokinesis. Patient asymptomatic and cardiac cath deferred due to thrombocytopenia as well as acute renal failure. He  was treated medically and antibiotics adjusted for better coverage. Repeat BC negative and IM recommending patient to continue on cipro X 14 days after last negative culture.   Past Medical History      Past Medical History:  Diagnosis Date  . Arthritis    "joints ache" (01/02/2014)  . At risk for sleep apnea    STOP-BANG= 4    SENT TO PCP 12-23-2013  . Bladder calculi   . CHF (congestive heart failure) ([place])   . Chronic cystitis   . Coronary artery disease CARDIOLOGIST-  DR Grace Bushy  MI -- S/P  11/89 CABG x6 (70% circ; 90% PD; 60-70% distal left main; 30% left circ; 90% 1st diag;, 2nd diag and 3rd diag. w/90%,  mild stenosis LAD and 60-70% pLAD)  Re-do CABG x5 in 1997  . Diverticulosis   . GERD (gastroesophageal reflux disease)   . History of Bell's palsy    RIGHT SIDE-- NO RESIDUAL  . Hx of dizziness   . Hyperlipidemia   . Hypertension    "not anymore" (01/02/2014)  . Ischemic cardiomyopathy    ef 35-40% per cath 08-28-2013  . Kidney stones "years ago"   "passed them"  . Melanoma of ear ([place])    "right"  . Mild dementia   . Myocardial infarction 1986; 1997  . Nocturia   . OAB (overactive bladder)   . Prostate cancer ([place]) 1998   S/P  [place]; [place]  . S/P CABG (coronary artery bypass graft)    [place]  . Skipped heart beats     occasional  . Urethral stricture   . UTI (urinary tract infection) 09/2015  . Wears hearing aid    bilateral  . Wears partial dentures     Family History  family history includes Diabetes in his father; Heart disease in his brother and mother.  Prior Rehab/Hospitalizations:  Has the patient had major surgery during 100 days prior to admission? No  Current Medications   Current Facility-Administered Medications:  .  0.9 %  sodium chloride infusion, 250 mL, Intravenous, PRN, Marijean Heath, NP .  acetaminophen (TYLENOL) tablet 650 mg, 650 mg, Oral, Q6H PRN, Doreatha Lew, MD, 650 mg at 01/20/16 [phone] .  albuterol (PROVENTIL) (2.5 MG/3ML) 0.083% nebulizer solution 2.5 mg, 2.5 mg, Nebulization, Q2H PRN, Marijean Heath, NP .  aspirin EC tablet 81 mg, 81 mg, Oral, Daily, Pixie Casino, MD, 81 mg at 01/20/16 0906 .  atorvastatin (LIPITOR) tablet 80 mg, 80 mg, Oral, QPM, Pixie Casino, MD, 80 mg at 01/20/16 1732 .  ciprofloxacin (CIPRO) tablet 500 mg, 500 mg, Oral, BID, Doreatha Lew, MD, 500 mg at 01/21/16 0748 .  docusate sodium (COLACE) capsule 100 mg, 100 mg, Oral, BID PRN, Brand Males, MD .  feeding supplement (ENSURE ENLIVE) (ENSURE ENLIVE) liquid 237 mL, 237 mL, Oral, BID BM, Waldemar Dickens, MD, 237 mL at 01/20/16 1305 .  Influenza vac split quadrivalent PF (FLUARIX) injection 0.5 mL, 0.5 mL, Intramuscular, Tomorrow-1000, Doreatha Lew, MD .  magic mouthwash w/lidocaine, 5 mL, Oral, TID, Doreatha Lew, MD, 5 mL at 01/20/16 2151 .  MEDLINE mouth rinse, 15 mL, Mouth Rinse, BID, Chesley Mires, MD, 15 mL at 01/20/16 0907 .  polyethylene glycol (MIRALAX / GLYCOLAX) packet 17 g, 17 g, Oral, Daily, Doreatha Lew, MD, 17 g at 01/20/16 0906 .  polyvinyl alcohol (LIQUIFILM TEARS) 1.4 %  ophthalmic solution 1 drop, 1 drop, Both Eyes, PRN, Rogue Bussing, MD, 1 drop at 01/15/16 1004 .  senna (SENOKOT) tablet 8.6 mg, 1 tablet, Oral, Daily,  Doreatha Lew, MD, 8.6 mg at 01/20/16 5003  Patients Current Diet: Diet Heart Room service appropriate? Yes; Fluid consistency: Thin  Precautions / Restrictions Precautions Precautions: Fall, Other (comment) Precaution Comments: fecal incontinence (on eval) Per pt, not at his baseline Restrictions Weight Bearing Restrictions: No   Has the patient had 2 or more falls or a fall with injury in the past year?No  Prior Activity Level Community (5-7x/wk): Independent and driving pta. self sufficient, riding mower, yard work, Nurse, adult / [place] Devices/Equipment: Dentures (specify type) Home Equipment: Cane - single point, Civil engineer, contracting, Grab bars - tub/shower, Grab bars - toilet, Walker - 2 wheels  Prior Device Use: Indicate devices/aids used by the patient prior to current illness, exacerbation or injury? None of the above  Prior Functional Level Prior Function Level of Independence: Independent Comments: drives, grocery shops, gardens  Self Care: Did the patient need help bathing, dressing, using the toilet or eating?  Independent  Indoor Mobility: Did the patient need assistance with walking from room to room (with or without device)? Independent  Stairs: Did the patient need assistance with internal or external stairs (with or without device)? Independent  Functional Cognition: Did the patient need help planning regular tasks such as shopping or remembering to take medications? Independent  Current Functional Level Cognition Overall Cognitive Status: Within Functional Limits for tasks assessed    Extremity Assessment (includes Sensation/Coordination) Upper Extremity Assessment: Generalized weakness  Lower Extremity Assessment: Generalized weakness   ADLs     Mobility Overal bed mobility: Needs Assistance Bed Mobility: Rolling, Sidelying to Sit, Sit to Supine Rolling: Min assist Sidelying to sit: Mod assist, HOB  elevated Sit to supine: Min assist General bed mobility comments: with rail, HOB elevated to exit; weak UEs and torso required assist; assist to legs to return to supine   Transfers Overall transfer level: Needs assistance Equipment used: 1 person hand held assist Transfers: Sit to/from Stand Sit to Stand: Mod assist General transfer comment: significant assist off toilet; less from raised surface   Ambulation / Gait / Stairs / Wheelchair Mobility Ambulation/Gait Ambulation/Gait assistance: Mod assist Ambulation Distance (Feet): 35 Feet (toileted, 10 ft) Assistive device: 1 person hand held assist Gait Pattern/deviations: Step-through pattern, Decreased stride length, Narrow base of support, Trunk flexed General Gait Details: Pt became increasingly fatigued (and rushing due to fecal incontinence) on return to room/bathroom with increased physical assist. After seated for toileting and "clean up" x 10 minutes, pt very fatigued with walk back to bed with unsafe reach and turn to sit at foot of bed as approaching.    Posture / Balance Balance Overall balance assessment: Needs assistance Sitting-balance support: No upper extremity supported, Feet supported Sitting balance-Leahy Scale: Fair (when fatigued) Standing balance support: Single extremity supported Standing balance-Leahy Scale: Poor   Special needs/care consideration BiPAP/CPAP  N/a CPM  N/a Continuous Drip IV  N/a Dialysis  N/a Life Vest  N/a Oxygen  N/a Special Bed  N/a Trach Size  N/a Wound Vac (area  N/a Skin  intact Bowel mgmt some incontinence 01/21/16 Bladder mgmt: supra pubic catheter for one year. Pt states he uses a leg bag during the day and switches to gravity bag at night.Likely need education reinforcement on changing bags due to frequent UTIs Diabetic mgmt n/a  Previous Home Environment Living Arrangements: Spouse/significant other  Lives With: Spouse Available Help at Discharge: Family, Available 24  hours/day Type of Home: House Home Layout: One level Home Access: Stairs to enter Entrance Stairs-Rails: Right Entrance Stairs-Number of Steps: 3 Bathroom Shower/Tub: Tub/shower unit, Architectural technologist: Handicapped height Bathroom Accessibility: Yes How Accessible: Accessible via walker [place]:  (Has used advanced Home care in the past)  Discharge Living Setting Plans for Discharge Living Setting: Patient's home, Lives with (comment) (spouse) Type of Home at Discharge: House Discharge Home Layout: One level Discharge Home Access: Stairs to enter Entrance Stairs-Rails: Right Entrance Stairs-Number of Steps: 3 Discharge Bathroom Shower/Tub: Tub/shower unit, Curtain Discharge Bathroom Toilet: Handicapped height Discharge Bathroom Accessibility: Yes How Accessible: Accessible via walker Does the patient have any problems obtaining your medications?: No  Social/Family/Support Systems Patient Roles: Spouse, Parent Contact Information: wife, [name] Anticipated Caregiver: wife and family Anticipated Caregiver's Contact Information: see above Ability/Limitations of Caregiver: can provide supervision level Caregiver Availability: 24/7 Discharge Plan Discussed with Primary Caregiver: Yes Is Caregiver In Agreement with Plan?: Yes Does Caregiver/Family have Issues with Lodging/Transportation while Pt is in Rehab?: No   Goals/Additional Needs Patient/Family Goal for Rehab: Mod I to supervision with PT and OT Expected length of stay: ELOS 10- 12 days Pt/Family Agrees to Admission and willing to participate: Yes Program Orientation Provided & Reviewed with Pt/Caregiver Including Roles  & Responsibilities: Yes  Decrease burden of Care through IP rehab admission: n/a  Possible need for SNF placement upon discharge:not anticipated  Patient Condition: This patient's condition remains as documented in the consult dated 01/20/2016, in which the Rehabilitation  Physician determined and documented that the patient's condition is appropriate for intensive rehabilitative care in an inpatient rehabilitation facility. Will admit to inpatient rehab today.   Preadmission Screen Completed By:  Cleatrice Burke, 01/21/2016 8:40 AM ______________________________________________________________________   Discussed status with Dr. Letta Pate on 01/21/2016 at  1017  and received telephone approval for admission today.  Admission Coordinator:  Cleatrice Burke, time [id number] Date 01/21/2016.       Cosigned by: Charlett Blake, MD at 01/21/2016 10:53 AM  Revision History

## 2016-01-21 NOTE — Discharge Summary (Signed)
Physician Discharge Summary  Ronald [name] PXT:[id number] DOB: [birth year] DOA: 01/14/2016  PCP: Nyoka Cowden, MD  Admit date: 01/14/2016 Discharge date: 01/21/2016  Admitted From: Home Disposition:  CIR  Recommendations for Outpatient Follow-up:  1. Follow up with PCP in 1-2 weeks 2. Please obtain BMP/CBC in one week    Discharge Condition: Stable CODE STATUS: FULL Diet recommendation: Heart Healthy   Brief/Interim Summary: 82 y/o M, former smoker, with PMH of arthritis, Bell's palsy, GERD, diverticulosis, CAD s/p CABG / stents (LVEF 45%), HTN, HLD, overactive bladder, prostate cancer, urethral stricture s/p chronic indwelling catheter with frequent UTI's and recent ER visit for suspected UTI (01/12/2016 - was treated with keflex, cultures grew multiple species) who presented to [facility] on 10/19 with altered mental status and abdominal pain. He was admitted to the ICU with hypotension and confusion concerning for septic shock secondary to urosepsis. He was started on pressors and IV antibiotics urology was consulted, suprapubic catheter was changed. Also cardiology was consulted due to elevation in troponin up to 9.7, now trending down. Cardiology deemed that might be due to demand ischemia versus STEMI but no cardiac cath needed at this time. Ultrasound was done for abdominal pain, which showed ? possible pancreatic duct dilation with ? mild intrahepatic biliary duct dilation, patient treated conservatively.  His diet was advanced and he tolerated it.  Abdominal pain resolved. Transferred to [facility] on 10/22 to continue management.   Surveillance blood cultures remained negative.  He will continue ciprofloxacin 500 mg bid with last dose on 01/31/16.  Discharge Diagnoses:  Sepsis -UTI related to chronic suprapubic catheter  -sepsis physiology resolved -secondary to bacteremia and UTI -Continue antibiotics -continue cipro, plan 14 days from last neg culture -appreciate urology  consult-->outpt followup -d/c IVF  -01/18/16-Repeated Blood cultures neg to date  Bacteremia -pseudomonas and citrobacter -continue cipro --Repeated Blood cultures neg to date -source likely urine -continue ciprofloxacin through 01/31/16 (stop after doses on 01/31/16)  Aphthous Ulcers - Patient report chronic Herpes labialis - likely stress related due to hospital stay  -magic mouth wash with lidocaine  -tolerating diet without problem  NSTEMI -appreciate cardiology -troponins peaked at 9.27 -Echo with EF 40-45%, anterior and apical hypokinesis, basal mid to inferior HK -manage medically -With resolving AKI, advanced age, ongoing infection, thrombocytopenia and clinical improvement would not pursue cath at this time per cardiology -Consider plavix once platelets improve. No beta blocker due to history of bradycardia per prior clinic notes. Has had hypotension on ACE-Is and also recovering from AKI.  -no further cardiac testing -restart imdur  AKI  - in setting of suspected septic shock  - Resolved  - trend BMP  Hx Prostate CA, overactive bladder, urethral stricture, chronic cystitis & suprapubic catheter -Trend BMP -suprapubic tube changed this admission  Abdominal Pain -  -resolved -tolerating diet  Transaminasemia -secondary to sepsis -improving -01/14/2016 abdomen ultrasound--prominent pancreatic duct, common about a 4.3 mm -no abd pain -trend LFTs  Thrombocytopenia  -secondary to sepsis -stable/improving -no signs of bleeding--Hgb stable   Discharge Instructions  Discharge Instructions    AMB Referral to [place] Management    Complete by:  As directed    Reason for consult:  Post hospital follow up; community support; re-admission   Diagnoses of:  Heart Failure   Expected date of contact:  1-3 days (reserved for hospital discharges)   Please assign to community nurse for transition of care calls and assess for home visits. Questions please  call:   Natividad Brood,  RN [facility] Liaison  [phone] business mobile phone Toll free office [phone]       Medication List    STOP taking these medications   cephALEXin 500 MG capsule Commonly known as:  KEFLEX     TAKE these medications   acetaminophen 500 MG tablet Commonly known as:  TYLENOL Take 500 mg by mouth every 6 (six) hours as needed for pain or fever. What changed:  Another medication with the same name was removed. Continue taking this medication, and follow the directions you see here.   aspirin 81 MG EC tablet Take 1 tablet (81 mg total) by mouth daily.   atorvastatin 80 MG tablet Commonly known as:  LIPITOR Take 1 tablet (80 mg total) by mouth every evening. What changed:  how much to take   ciprofloxacin 500 MG tablet Commonly known as:  CIPRO Take 1 tablet (500 mg total) by mouth 2 (two) times daily. Last dose on 01/31/16   flavoxATE 100 MG tablet Commonly known as:  URISPAS Take 100 mg by mouth 3 (three) times daily as needed for bladder spasms.   galantamine 8 MG 24 hr capsule Commonly known as:  RAZADYNE ER Take 8 mg by mouth daily with breakfast.   HYDROcodone-acetaminophen 5-325 MG tablet Commonly known as:  NORCO/VICODIN Take 1 tablet by mouth every 6 (six) hours as needed for moderate pain.   hydroxypropyl methylcellulose / hypromellose 2.5 % ophthalmic solution Commonly known as:  ISOPTO TEARS / GONIOVISC Place 1 drop into both eyes 2 (two) times daily.   isosorbide mononitrate 60 MG 24 hr tablet Commonly known as:  IMDUR Take 0.5 tablets (30 mg total) by mouth every morning.   multivitamin with minerals Tabs tablet Take 1 tablet by mouth daily.   nitroGLYCERIN 0.4 MG SL tablet Commonly known as:  NITROSTAT Place 0.4 mg under the tongue every 5 (five) minutes as needed for chest pain.   Vitamin D 2000 units tablet Take 2,000 Units by mouth daily.       Allergies  Allergen Reactions  .  Pantoprazole Other (See Comments)    Headache and lightheaded  . Nitrofurantoin Hives  . Sulfa Antibiotics Hives and Itching    Consultations:  Cardiology  PM&R  Urology--[place]  PCCM   Procedures/Studies: Dg Chest 2 View  Result Date: 01/13/2016 CLINICAL DATA:  She 82 year old male with fever and fatigue EXAM: CHEST  2 VIEW COMPARISON:  Chest radiograph dated 10/13/2015 and chest radiograph dated 12/04/2014 FINDINGS: There bibasilar platelike atelectasis/ scarring. Infiltrate is less likely but not excluded. There is no pleural effusion or pneumothorax. The cardiac silhouette is within normal limits. Median sternotomy wires and CABG vascular clips noted. There is osteopenia with degenerative changes of the spine. Mid thoracic compression deformities similar to prior radiograph. No acute fracture. IMPRESSION: Bibasilar platelike atelectasis/scarring. Infiltrate is less likely. Clinical correlation is recommended. Electronically Signed   By: Anner Crete M.D.   On: 01/13/2016 00:11   US Abdomen Port  Result Date: 01/14/2016 CLINICAL DATA:  Cholecystectomy.  Elevated liver function tests . EXAM: ABDOMEN ULTRASOUND COMPLETE COMPARISON:  CT 12/10/2013.  MRI 12/21/2012. FINDINGS: Gallbladder: Cholecystectomy. Common bile duct: Diameter: 5.3 mm Liver: Heterogeneous parenchymal pattern. Mild intrahepatic biliary ductal dilatation cannot be excluded. IVC: No abnormality visualized. Pancreas: Difficult to visualize. Pancreatic duct is again noted to be slightly prominent 3.9 mm . Spleen: Size and appearance within normal limits. Right Kidney: Length: 12.7 cm. Echogenicity within normal limits. No hydronephrosis visualized. 5.2  x 5.0 x 4.9 cm simple cyst right kidney. Left Kidney: Length: 12.8 cm. Echogenicity within normal limits. No mass or hydronephrosis visualized. Abdominal aorta: No aneurysm visualized. Other findings: None. IMPRESSION: 1. Cholecystectomy. 2. Heterogeneous hepatic  parenchymal pattern. Mild intrahepatic biliary ductal dilatation cannot be excluded. 3. Pancreas is difficult to visualize. Pancreatic duct is again noted to be slightly prominent at 3.9 mm. If symptoms persist MRCP can be obtained for further evaluation. Electronically Signed   By: Marcello Moores  Register   On: 01/14/2016 16:03   Dg Chest Port 1 View  Result Date: 01/15/2016 CLINICAL DATA:  Respiratory failure. EXAM: PORTABLE CHEST 1 VIEW COMPARISON:  01/14/2016 . FINDINGS: Prior CABG. Cardiomegaly with mild pulmonary venous congestion and mild interstitial prominence. Slight improvement from prior exam. Persistent atelectasis left lung base. Persistent small left pleural effusion. IMPRESSION: 1. Prior CABG. Interim slight improvement of congestive heart failure and interstitial edema. Persistent small left pleural effusion. 2.  Persistent atelectasis left lung base. Electronically Signed   By: Marcello Moores  Register   On: 01/15/2016 07:53   Dg Chest Port 1 View  Result Date: 01/14/2016 CLINICAL DATA:  Sepsis.  Hypoxia. EXAM: PORTABLE CHEST 1 VIEW COMPARISON:  01/12/2016. FINDINGS: Prior CABG. Borderline cardiomegaly. Mild pulmonary venous congestion and interstitial prominence with left-sided pleural effusion. Findings suggest mild congestive heart failure. Left base atelectasis. No pneumothorax. IMPRESSION: 1. Prior CABG. Mild pulmonary venous congestion and interstitial prominence with small left pleural effusion consistent mild congestive heart failure. 2. Mild left base atelectasis. Electronically Signed   By: Marcello Moores  Register   On: 01/14/2016 10:51        Discharge Exam: Vitals:   01/21/16 0608 01/21/16 0841  BP: (!) 125/54 135/61  Pulse: (!) 57 (!) 56  Resp: 17 16  Temp: 98.2 F (36.8 C) 97.3 F (36.3 C)   Vitals:   01/20/16 1409 01/20/16 2154 01/21/16 0608 01/21/16 0841  BP: (!) 125/45 (!) 141/66 (!) 125/54 135/61  Pulse: 62 (!) 55 (!) 57 (!) 56  Resp: 16 17 17 16   Temp: 97.5 F (36.4 C)  98.1 F (36.7 C) 98.2 F (36.8 C) 97.3 F (36.3 C)  TempSrc: Oral Oral Oral Oral  SpO2: 97% 95% 95% 95%  Weight:   73.4 kg (161 lb 13.1 oz)   Height:        General: Pt is alert, awake, not in acute distress Cardiovascular: RRR, S1/S2 +, no rubs, no gallops Respiratory: CTA bilaterally, no wheezing, no rhonchi Abdominal: Soft, NT, ND, bowel sounds + Extremities: no edema, no cyanosis   The results of significant diagnostics from this hospitalization (including imaging, microbiology, ancillary and laboratory) are listed below for reference.    Significant Diagnostic Studies: Dg Chest 2 View  Result Date: 01/13/2016 CLINICAL DATA:  She 82 year old male with fever and fatigue EXAM: CHEST  2 VIEW COMPARISON:  Chest radiograph dated 10/13/2015 and chest radiograph dated 12/04/2014 FINDINGS: There bibasilar platelike atelectasis/ scarring. Infiltrate is less likely but not excluded. There is no pleural effusion or pneumothorax. The cardiac silhouette is within normal limits. Median sternotomy wires and CABG vascular clips noted. There is osteopenia with degenerative changes of the spine. Mid thoracic compression deformities similar to prior radiograph. No acute fracture. IMPRESSION: Bibasilar platelike atelectasis/scarring. Infiltrate is less likely. Clinical correlation is recommended. Electronically Signed   By: Anner Crete M.D.   On: 01/13/2016 00:11   US Abdomen Port  Result Date: 01/14/2016 CLINICAL DATA:  Cholecystectomy.  Elevated liver function tests . EXAM: ABDOMEN  ULTRASOUND COMPLETE COMPARISON:  CT 12/10/2013.  MRI 12/21/2012. FINDINGS: Gallbladder: Cholecystectomy. Common bile duct: Diameter: 5.3 mm Liver: Heterogeneous parenchymal pattern. Mild intrahepatic biliary ductal dilatation cannot be excluded. IVC: No abnormality visualized. Pancreas: Difficult to visualize. Pancreatic duct is again noted to be slightly prominent 3.9 mm . Spleen: Size and appearance within normal  limits. Right Kidney: Length: 12.7 cm. Echogenicity within normal limits. No hydronephrosis visualized. 5.2 x 5.0 x 4.9 cm simple cyst right kidney. Left Kidney: Length: 12.8 cm. Echogenicity within normal limits. No mass or hydronephrosis visualized. Abdominal aorta: No aneurysm visualized. Other findings: None. IMPRESSION: 1. Cholecystectomy. 2. Heterogeneous hepatic parenchymal pattern. Mild intrahepatic biliary ductal dilatation cannot be excluded. 3. Pancreas is difficult to visualize. Pancreatic duct is again noted to be slightly prominent at 3.9 mm. If symptoms persist MRCP can be obtained for further evaluation. Electronically Signed   By: Marcello Moores  Register   On: 01/14/2016 16:03   Dg Chest Port 1 View  Result Date: 01/15/2016 CLINICAL DATA:  Respiratory failure. EXAM: PORTABLE CHEST 1 VIEW COMPARISON:  01/14/2016 . FINDINGS: Prior CABG. Cardiomegaly with mild pulmonary venous congestion and mild interstitial prominence. Slight improvement from prior exam. Persistent atelectasis left lung base. Persistent small left pleural effusion. IMPRESSION: 1. Prior CABG. Interim slight improvement of congestive heart failure and interstitial edema. Persistent small left pleural effusion. 2.  Persistent atelectasis left lung base. Electronically Signed   By: Marcello Moores  Register   On: 01/15/2016 07:53   Dg Chest Port 1 View  Result Date: 01/14/2016 CLINICAL DATA:  Sepsis.  Hypoxia. EXAM: PORTABLE CHEST 1 VIEW COMPARISON:  01/12/2016. FINDINGS: Prior CABG. Borderline cardiomegaly. Mild pulmonary venous congestion and interstitial prominence with left-sided pleural effusion. Findings suggest mild congestive heart failure. Left base atelectasis. No pneumothorax. IMPRESSION: 1. Prior CABG. Mild pulmonary venous congestion and interstitial prominence with small left pleural effusion consistent mild congestive heart failure. 2. Mild left base atelectasis. Electronically Signed   By: Marcello Moores  Register   On: 01/14/2016  10:51     Microbiology: Recent Results (from the past 240 hour(s))  Urine culture     Status: Abnormal   Collection Time: 01/13/16 12:15 AM  Result Value Ref Range Status   Specimen Description URINE, RANDOM  Final   Special Requests NONE  Final   Culture MULTIPLE SPECIES PRESENT, SUGGEST RECOLLECTION (A)  Final   Report Status 01/14/2016 FINAL  Final  Blood Culture (routine x 2)     Status: Abnormal   Collection Time: 01/14/16 10:20 AM  Result Value Ref Range Status   Specimen Description BLOOD RIGHT ANTECUBITAL  Final   Special Requests BOTTLES DRAWN AEROBIC AND ANAEROBIC  5CC  Final   Culture  Setup Time   Final    GRAM NEGATIVE RODS IN BOTH AEROBIC AND ANAEROBIC BOTTLES CRITICAL RESULT CALLED TO, READ BACK BY AND VERIFIED WITH: KIM HAMMONS,PHARMD @0049  01/15/16 MKELLY,MLT    Culture (A)  Final    CITROBACTER BRAAKII PSEUDOMONAS AERUGINOSA SUSCEPTIBILITIES PERFORMED ON PREVIOUS CULTURE WITHIN THE LAST 5 DAYS.    Report Status 01/18/2016 FINAL  Final   Organism ID, Bacteria PSEUDOMONAS AERUGINOSA  Final      Susceptibility   Pseudomonas aeruginosa - MIC*    CEFTAZIDIME >=64 RESISTANT Resistant     CIPROFLOXACIN <=0.25 SENSITIVE Sensitive     GENTAMICIN <=1 SENSITIVE Sensitive     IMIPENEM 2 SENSITIVE Sensitive     PIP/TAZO 64 SENSITIVE Sensitive     CEFEPIME 2 INTERMEDIATE Intermediate     *  PSEUDOMONAS AERUGINOSA  Blood Culture (routine x 2)     Status: Abnormal   Collection Time: 01/14/16 10:47 AM  Result Value Ref Range Status   Specimen Description BLOOD RIGHT HAND  Final   Special Requests BOTTLES DRAWN AEROBIC AND ANAEROBIC  5CC  Final   Culture  Setup Time   Final    GRAM NEGATIVE RODS IN BOTH AEROBIC AND ANAEROBIC BOTTLES CRITICAL RESULT CALLED TO, READ BACK BY AND VERIFIED WITH: KIMBERELY HAMMONS,PHARMD @0049  01/15/16 MKELLY,MLT    Culture (A)  Final    CITROBACTER BRAAKII PSEUDOMONAS AERUGINOSA SUSCEPTIBILITIES PERFORMED ON PREVIOUS CULTURE WITHIN THE  LAST 5 DAYS.    Report Status 01/18/2016 FINAL  Final   Organism ID, Bacteria CITROBACTER BRAAKII  Final      Susceptibility   Citrobacter braakii - MIC*    CEFAZOLIN >=64 RESISTANT Resistant     CEFEPIME <=1 SENSITIVE Sensitive     CEFTAZIDIME >=64 RESISTANT Resistant     CEFTRIAXONE >=64 RESISTANT Resistant     CIPROFLOXACIN <=0.25 SENSITIVE Sensitive     GENTAMICIN <=1 SENSITIVE Sensitive     IMIPENEM 1 SENSITIVE Sensitive     TRIMETH/SULFA <=20 SENSITIVE Sensitive     PIP/TAZO >=128 RESISTANT Resistant     * CITROBACTER BRAAKII  Blood Culture ID Panel (Reflexed)     Status: Abnormal   Collection Time: 01/14/16 10:47 AM  Result Value Ref Range Status   Enterococcus species NOT DETECTED NOT DETECTED Final   Listeria monocytogenes NOT DETECTED NOT DETECTED Final   Staphylococcus species NOT DETECTED NOT DETECTED Final   Staphylococcus aureus NOT DETECTED NOT DETECTED Final   Streptococcus species NOT DETECTED NOT DETECTED Final   Streptococcus agalactiae NOT DETECTED NOT DETECTED Final   Streptococcus pneumoniae NOT DETECTED NOT DETECTED Final   Streptococcus pyogenes NOT DETECTED NOT DETECTED Final   Acinetobacter baumannii NOT DETECTED NOT DETECTED Final   Enterobacteriaceae species DETECTED (A) NOT DETECTED Final    Comment: CRITICAL RESULT CALLED TO, READ BACK BY AND VERIFIED WITH: KIM HAMMONS,PHARMD @0049  01/15/16 MKELLY,MLT    Enterobacter cloacae complex NOT DETECTED NOT DETECTED Final   Escherichia coli NOT DETECTED NOT DETECTED Final   Klebsiella oxytoca NOT DETECTED NOT DETECTED Final   Klebsiella pneumoniae NOT DETECTED NOT DETECTED Final   Proteus species NOT DETECTED NOT DETECTED Final   Serratia marcescens NOT DETECTED NOT DETECTED Final   Carbapenem resistance NOT DETECTED NOT DETECTED Final   Haemophilus influenzae NOT DETECTED NOT DETECTED Final   Neisseria meningitidis NOT DETECTED NOT DETECTED Final   Pseudomonas aeruginosa DETECTED (A) NOT DETECTED Final      Comment: CRITICAL RESULT CALLED TO, READ BACK BY AND VERIFIED WITH: KIM HAMMONS,PHARMD @0049  01/15/16 MKELLY,MLT    Candida albicans NOT DETECTED NOT DETECTED Final   Candida glabrata NOT DETECTED NOT DETECTED Final   Candida krusei NOT DETECTED NOT DETECTED Final   Candida parapsilosis NOT DETECTED NOT DETECTED Final   Candida tropicalis NOT DETECTED NOT DETECTED Final  Urine culture     Status: Abnormal   Collection Time: 01/14/16 11:18 AM  Result Value Ref Range Status   Specimen Description URINE, RANDOM  Final   Special Requests NONE  Final   Culture MULTIPLE SPECIES PRESENT, SUGGEST RECOLLECTION (A)  Final   Report Status 01/15/2016 FINAL  Final  MRSA PCR Screening     Status: None   Collection Time: 01/14/16  4:51 PM  Result Value Ref Range Status   MRSA by PCR NEGATIVE NEGATIVE Final  Comment:        The GeneXpert MRSA Assay (FDA approved for NASAL specimens only), is one component of a comprehensive MRSA colonization surveillance program. It is not intended to diagnose MRSA infection nor to guide or monitor treatment for MRSA infections.   Culture, blood (routine x 2)     Status: None (Preliminary result)   Collection Time: 01/18/16 10:46 AM  Result Value Ref Range Status   Specimen Description BLOOD RIGHT ANTECUBITAL  Final   Special Requests IN PEDIATRIC BOTTLE  3CC  Final   Culture NO GROWTH 2 DAYS  Final   Report Status PENDING  Incomplete  Culture, blood (routine x 2)     Status: None (Preliminary result)   Collection Time: 01/18/16 10:49 AM  Result Value Ref Range Status   Specimen Description BLOOD LEFT ANTECUBITAL  Final   Special Requests IN PEDIATRIC BOTTLE  3CC  Final   Culture NO GROWTH 2 DAYS  Final   Report Status PENDING  Incomplete     Labs: Basic Metabolic Panel:  Recent Labs Lab 01/16/16 0229 01/17/16 0525 01/18/16 0059 01/19/16 0425 01/20/16 0704 01/21/16 0404  NA 137 139 138 138 138  --   K 3.9 4.1 3.6 4.0 4.0  --   CL 113*  110 107 106 105  --   CO2 19* 24 23 26 25   --   GLUCOSE 123* 106* 120* 126* 119*  --   BUN 37* 27* 21* 18 21*  --   CREATININE 1.42* 1.15 0.92 0.96 0.90  --   CALCIUM 8.0* 8.4* 8.6* 8.5* 9.0  --   MG 2.0 2.0 1.9 1.9 1.9 2.0  PHOS 2.4* 1.7* 2.3* 2.9 3.3  --    Liver Function Tests:  Recent Labs Lab 01/15/16 0045 01/15/16 1526 01/16/16 0229 01/17/16 0525 01/18/16 0059 01/19/16 0425 01/20/16 0704  AST 138* 122* 92* 100*  --  51*  --   ALT 87* 85* 75* 88*  --  69*  --   ALKPHOS 134* 137* 180* 392*  --  339*  --   BILITOT 2.3* 1.7* 1.3* 1.1  --  1.4*  --   PROT 5.2* 5.0* 5.0* 4.7*  --  4.9*  --   ALBUMIN 2.6* 2.4* 2.3* 2.2*  2.1* 2.3* 2.2* 2.4*    Recent Labs Lab 01/14/16 1651  LIPASE 17  AMYLASE 36   No results for input(s): AMMONIA in the last 168 hours. CBC:  Recent Labs Lab 01/17/16 0525 01/18/16 0059 01/19/16 0425 01/20/16 0704 01/21/16 0404  WBC 17.0* 13.6* 11.9* 13.6* 12.7*  NEUTROABS  --  11.6*  --   --   --   HGB 12.6* 13.4 12.8* 13.2 14.0  HCT 36.9* 39.1 38.7* 39.2 41.6  MCV 89.1 89.1 91.1 90.1 91.4  PLT 57* 77* 88* 126* 86*   Cardiac Enzymes:  Recent Labs Lab 01/15/16 0045 01/15/16 0543 01/15/16 1237 01/15/16 1917 01/18/16 0059  TROPONINI 7.70* 9.27* 8.28* 5.04* 2.23*   BNP: Invalid input(s): POCBNP CBG:  Recent Labs Lab 01/14/16 1634  GLUCAP 159*    Time coordinating discharge:  Greater than 30 minutes  Signed:  Katiejo Gilroy, DO [facility] Pager: [phone] 01/21/2016, 10:40 AM

## 2016-01-21 NOTE — Progress Notes (Signed)
I met with pt at bedside and contacted Dr. Carles Collet. We will make the arrangements to admit to inpt rehab today. [phone]

## 2016-01-21 NOTE — Care Management Important Message (Signed)
Important Message  Patient Details  Name: Ronald [name] MRN: [id number] Date of Birth: 12/22/1927   Medicare Important Message Given:  Yes    [name] 01/21/2016, 10:05 AM

## 2016-01-21 NOTE — H&P (Signed)
Physical Medicine and Rehabilitation Admission H&P       Chief Complaint  Patient presents with  . Debility  . Advanced age    HPI: Ronald [name] a 82 y.o.malewith history ofBell's palsy, GERD, CAD s/p CABG/ stents (LVEF 45%), HTN, prostate cancer, urethral stricture with chronic UTIs and SPC with recent treatment for UTI. He was admitted on 10/19/17with altered mental status, abdominal pain and hypotension due to urosepsis. Ultrasound of abdomen done revealing continued mild prominence of pancreatic duct and question of mild intrahepatic biliary ductal dilatation. He was started on pressors and IV antibiotics and SPC changed out by urology. Blood cultures positive for citrobacter Braaki and Pseudomonas Aeruginosa. Cardiology consulted for elevated troponin's and ST changes felt to be due to demand ischemia. 2 D echo done for work up and showed EF 40-45% with anterior, apical and basal to mid inferior wall hypokinesis. Patient asymptomatic and cardiac cath deferred due to thrombocytopenia as well as acute renal failure. He was treated medically and antibiotics adjusted for better coverage. Repeat [facility] 10/23 pending and IM recommending patient to continue on cipro X 14 days after last negative culture--thorough 11/5.  PT evaluation done 10/24 showing patient to be debilitated and  CIR recommended for follow up therapy.     Review of Systems  Constitutional: Positive for malaise/fatigue.  HENT: Positive for hearing loss.   Eyes: Negative for blurred vision and double vision.  Respiratory: Positive for shortness of breath. Negative for cough.   Cardiovascular: Negative for chest pain and palpitations.  Gastrointestinal: Positive for heartburn. Negative for constipation and nausea.  Genitourinary: Negative for dysuria and urgency.  Musculoskeletal: Positive for myalgias. Negative for back pain and neck pain.  Neurological: Negative for dizziness, tingling, sensory change, focal  weakness and headaches.  Psychiatric/Behavioral: The patient is not nervous/anxious and does not have insomnia.           Past Medical History:  Diagnosis Date  . Arthritis    "joints ache" (01/02/2014)  . At risk for sleep apnea    STOP-BANG= 4    SENT TO PCP 12-23-2013  . Bladder calculi   . CHF (congestive heart failure) ([place])   . Chronic cystitis   . Coronary artery disease CARDIOLOGIST-  DR Grace Bushy  MI -- S/P  11/89 CABG x6 (70% circ; 90% PD; 60-70% distal left main; 30% left circ; 90% 1st diag;, 2nd diag and 3rd diag. w/90%,  mild stenosis LAD and 60-70% pLAD)  Re-do CABG x5 in 1997  . Diverticulosis   . GERD (gastroesophageal reflux disease)   . History of Bell's palsy    RIGHT SIDE-- NO RESIDUAL  . Hx of dizziness   . Hyperlipidemia   . Hypertension    "not anymore" (01/02/2014)  . Ischemic cardiomyopathy    ef 35-40% per cath 08-28-2013  . Kidney stones "years ago"   "passed them"  . Melanoma of ear ([place])    "right"  . Mild dementia   . Myocardial infarction 1986; 1997  . Nocturia   . OAB (overactive bladder)   . Prostate cancer ([place]) 1998   S/P  [place]; [place]  . S/P CABG (coronary artery bypass graft)    [place]  . Skipped heart beats    occasional  . Urethral stricture   . UTI (urinary tract infection) 09/2015  . Wears hearing aid    bilateral  . Wears partial dentures  Past Surgical History:  Procedure Laterality Date  . CARDIAC CATHETERIZATION  02-22-2005  dr Vidal Schwalbe   mild to moderate lv dysfunction with inferobasilar akinesis/  totally occluded SVG to Intermediate Diagonal and SVG to PDA and RCA branches, totally occluded native coronary circulation with diffuse disease pLAD diagonal system with potentially could be ischemic/  patent SVG to OM with collaterals to dRCA and patent LIMA to LAD and diagonal systemss  . CARDIAC CATHETERIZATION  08-28-2013   DR Daneen Schick   widely patent sequential left internal graft to the diagonal/LAD, widely patent SVG to OM with proximal 50% narrowing noted in the graft, total occlusion SVG's to RCA, RI, and the Diagonal/ LV dysfunction with inferobasal aneurysm and mid anterior wall region of akinesis/  overall EF 35-40%/  Total occlusion of the navtive circulation/  no significant change compared to 2006 cath  . CARDIOVASCULAR STRESS TEST  08-01-2011  dr Angelena Form   inferior scar and possible soft tissue attenuation with minimal peri-infarct ischemia, small region of anterior ischemia and scar/  LVEF 53% LV wall motion with inferior hypokinesis/ no significant change from scan july 2011  . CATARACT EXTRACTION W/ INTRAOCULAR LENS  IMPLANT, BILATERAL Bilateral 2007  . CORONARY ARTERY BYPASS GRAFT  11/ [place]-- 6 vessel/  1997 Re-do 5 vessel  . CYSTOSCOPY WITH URETHRAL DILATATION N/A 12/25/2013   Procedure: CYSTOSCOPY WITH URETHRAL DILATATION, WITH BIOPSY;  Surgeon: Bernestine Amass, MD;  Location: [facility];  Service: Urology;  Laterality: N/A;  . CYSTOSCOPY WITH URETHRAL DILATATION N/A 12/22/2014   Procedure: CYSTOSCOPY WITH URETHRAL DILATATION;  Surgeon: Rana Snare, MD;  Location: [facility];  Service: Urology;  Laterality: N/A;  BALLOON DILATION CATHETER   . EUS  10/05/2011   Procedure: ESOPHAGEAL ENDOSCOPIC ULTRASOUND (EUS) RADIAL;  Surgeon: Arta Silence, MD;  Location: [facility];  Service: Endoscopy;  Laterality: N/A;  . INSERTION OF SUPRAPUBIC CATHETER N/A 12/22/2014   Procedure: INSERTION OF SUPRAPUBIC CATHETER ;  Surgeon: Rana Snare, MD;  Location: [facility];  Service: Urology;  Laterality: N/A;  . LAPAROSCOPIC CHOLECYSTECTOMY  12-23-2005  . LEFT HEART CATHETERIZATION WITH CORONARY/GRAFT ANGIOGRAM N/A 08/28/2013   Procedure: LEFT HEART CATHETERIZATION WITH Beatrix Fetters;  Surgeon: Sinclair Grooms, MD;  Location[facility] C[facility] LAB;  Service: Cardiovascular;   Laterality: N/A;  . MELANOMA EXCISION  X 1   "ear"  . RADIOACTIVE PROSTATE SEED IMPLANTS  1998  . SKIN CANCER EXCISION  X 2   "top of head"  . TONSILLECTOMY AND ADENOIDECTOMY  1954         Family History  Problem Relation Age of Onset  . Heart disease Mother   . Diabetes Father   . Heart disease Brother     Social History:  Married--wife supportive and can provide supervision after discharge. Independent without AD and does home exercises/walks 1/4 mile couple of times a day.  Per reports hequit smoking about 32 years ago. His smoking use included Cigars. He quit after 40.00 years of use. He quit smokeless tobacco use about 2 years ago. His smokeless tobacco use included Chew. He reports that he does not drink alcohol or use drugs.        Allergies  Allergen Reactions  . Pantoprazole Other (See Comments)    Headache and lightheaded  . Nitrofurantoin Hives  . Sulfa Antibiotics Hives and Itching          Medications Prior to Admission  Medication Sig Dispense  Refill  . acetaminophen (TYLENOL) 325 MG tablet Take 325 mg by mouth every 4 (four) hours as needed for fever.    Marland Kitchen acetaminophen (TYLENOL) 500 MG tablet Take 500 mg by mouth every 6 (six) hours as needed for pain or fever.     Marland Kitchen aspirin EC 81 MG EC tablet Take 1 tablet (81 mg total) by mouth daily. 30 tablet 0  . atorvastatin (LIPITOR) 80 MG tablet Take 40 mg by mouth every evening.    . cephALEXin (KEFLEX) 500 MG capsule Take 1 capsule (500 mg total) by mouth 2 (two) times daily. 14 capsule 0  . Cholecalciferol (VITAMIN D) 2000 UNITS tablet Take 2,000 Units by mouth daily.    . flavoxATE (URISPAS) 100 MG tablet Take 100 mg by mouth 3 (three) times daily as needed for bladder spasms.    Marland Kitchen galantamine (RAZADYNE ER) 8 MG 24 hr capsule Take 8 mg by mouth daily with breakfast.    . HYDROcodone-acetaminophen (NORCO/VICODIN) 5-325 MG tablet Take 1 tablet by mouth every 6 (six) hours as needed  for moderate pain.    . hydroxypropyl methylcellulose (ISOPTO TEARS) 2.5 % ophthalmic solution Place 1 drop into both eyes 2 (two) times daily.     . isosorbide mononitrate (IMDUR) 60 MG 24 hr tablet Take 0.5 tablets (30 mg total) by mouth every morning.    . Multiple Vitamin (MULTIVITAMIN WITH MINERALS) TABS Take 1 tablet by mouth daily.    . nitroGLYCERIN (NITROSTAT) 0.4 MG SL tablet Place 0.4 mg under the tongue every 5 (five) minutes as needed for chest pain.       Home: Home Living Family/patient expects to be discharged to:: Private residence Living Arrangements: Spouse/significant other Available Help at Discharge: Family, Available 24 hours/day (children also local) Type of Home: House Home Access: Stairs to enter [employer] of Steps: 3 Entrance Stairs-Rails: Right Home Layout: One level Bathroom Shower/Tub: Tub/shower unit, Architectural technologist: Handicapped height Bathroom Accessibility: Yes Home Equipment: [employer] - single point, Shower seat, Grab bars - tub/shower, Grab bars - toilet, Environmental consultant - 2 wheels   Functional History: Prior Function Level of Independence: Independent Comments: drives, grocery shops, gardens  Functional Status:  Mobility: Bed Mobility Overal bed mobility: Needs Assistance Bed Mobility: Rolling, Sidelying to Sit, Sit to Supine Rolling: Min assist Sidelying to sit: Mod assist, HOB elevated Sit to supine: Min assist General bed mobility comments: with rail, HOB elevated to exit; weak UEs and torso required assist; assist to legs to return to supine Transfers Overall transfer level: Needs assistance Equipment used: 1 person hand held assist Transfers: Sit to/from Stand Sit to Stand: Mod assist General transfer comment: significant assist off toilet; less from raised surface Ambulation/Gait Ambulation/Gait assistance: Mod assist Ambulation Distance (Feet): 35 Feet (toileted, 10 ft) Assistive device: 1 person hand held  assist Gait Pattern/deviations: Step-through pattern, Decreased stride length, Narrow base of support, Trunk flexed General Gait Details: Pt became increasingly fatigued (and rushing due to fecal incontinence) on return to room/bathroom with increased physical assist. After seated for toileting and "clean up" x 10 minutes, pt very fatigued with walk back to bed with unsafe reach and turn to sit at foot of bed as approaching.     ADL:    Cognition: Cognition Overall Cognitive Status: Within Functional Limits for tasks assessed Cognition Arousal/Alertness: Awake/alert Behavior During Therapy: WFL for tasks assessed/performed Overall Cognitive Status: Within Functional Limits for tasks assessed   Blood pressure (!) 125/54, pulse (!) 57, temperature 98.2  F (36.8 C), temperature source Oral, resp. rate 17, height 5' 5"  (1.651 m), weight 73.4 kg (161 lb 13.1 oz), SpO2 95 %. Physical Exam  Nursing note and vitals reviewed. Constitutional: He is oriented to person, place, and time. He appears well-developed and well-nourished.  HENT:  Head: Normocephalic and atraumatic.  Mouth/Throat: Oropharynx is clear and moist.  boderline dentition.   Eyes: Conjunctivae are normal. Pupils are equal, round, and reactive to light.  Neck: Normal range of motion. Neck supple.  Cardiovascular: Normal rate and regular rhythm.   Murmur heard. Respiratory: Effort normal and breath sounds normal. No stridor.  GI: Soft. Bowel sounds are normal. He exhibits no distension. There is no tenderness.  Musculoskeletal: He exhibits no edema.  Neurological: He is alert and oriented to person, place, and time.  Skin: Skin is warm and dry.  Psychiatric: He has a normal mood and affect. His behavior is normal. Judgment and thought content normal.    Lab Results Last 48 Hours        Results for orders placed or performed during the hospital encounter of 01/14/16 (from the past 48 hour(s))  Magnesium     Status:  None   Collection Time: 01/20/16  7:04 AM  Result Value Ref Range   Magnesium 1.9 1.7 - 2.4 mg/dL  CBC     Status: Abnormal   Collection Time: 01/20/16  7:04 AM  Result Value Ref Range   WBC 13.6 (H) 4.0 - 10.5 K/uL   RBC 4.35 4.22 - 5.81 MIL/uL   Hemoglobin 13.2 13.0 - 17.0 g/dL   HCT 39.2 39.0 - 52.0 %   MCV 90.1 78.0 - 100.0 fL   MCH 30.3 26.0 - 34.0 pg   MCHC 33.7 30.0 - 36.0 g/dL   RDW 14.5 11.5 - 15.5 %   Platelets 126 (L) 150 - 400 K/uL  Renal function panel     Status: Abnormal   Collection Time: 01/20/16  7:04 AM  Result Value Ref Range   Sodium 138 135 - 145 mmol/L   Potassium 4.0 3.5 - 5.1 mmol/L   Chloride 105 101 - 111 mmol/L   CO2 25 22 - 32 mmol/L   Glucose, Bld 119 (H) 65 - 99 mg/dL   BUN 21 (H) 6 - 20 mg/dL   Creatinine, Ser 0.90 0.61 - 1.24 mg/dL   Calcium 9.0 8.9 - 10.3 mg/dL   Phosphorus 3.3 2.5 - 4.6 mg/dL   Albumin 2.4 (L) 3.5 - 5.0 g/dL   GFR calc non Af Amer >60 >60 mL/min   GFR calc Af Amer >60 >60 mL/min    Comment: (NOTE) The eGFR has been calculated using the CKD EPI equation. This calculation has not been validated in all clinical situations. eGFR's persistently <60 mL/min signify possible Chronic Kidney Disease.   Anion gap 8 5 - 15  Magnesium     Status: None   Collection Time: 01/21/16  4:04 AM  Result Value Ref Range   Magnesium 2.0 1.7 - 2.4 mg/dL  CBC     Status: Abnormal   Collection Time: 01/21/16  4:04 AM  Result Value Ref Range   WBC 12.7 (H) 4.0 - 10.5 K/uL   RBC 4.55 4.22 - 5.81 MIL/uL   Hemoglobin 14.0 13.0 - 17.0 g/dL   HCT 41.6 39.0 - 52.0 %   MCV 91.4 78.0 - 100.0 fL   MCH 30.8 26.0 - 34.0 pg   MCHC 33.7 30.0 - 36.0 g/dL   RDW 14.6  11.5 - 15.5 %   Platelets 86 (L) 150 - 400 K/uL    Comment: CONSISTENT WITH PREVIOUS RESULT     Imaging Results (Last 48 hours)  No results found.       Medical Problem List and Plan: 1.  Deconditioning secondary to urosepsis and  non-ST MI 2.  DVT Prophylaxis/Anticoagulation: Mechanical: Sequential compression devices, below knee Bilateral lower extremities 3. Pain Management: tylenol prn  4. Mood: LCSW to follow for evaluation and support.  5. Neuropsych: This patient is capable of making decisions on his own behalf. 6. Skin/Wound Care: Routine pressure relief measures 7. Fluids/Electrolytes/Nutrition: Monitor I/O. Check lytes in am.  8. Citrobacter Braakii/Pseudomonas aeruginosa bacteremia: ON cipro  through 11/5           9. NSTEMI: Due to demand ischemia. Treated medically as without symptoms--monitor with increase in activity.  10. Persistent leucocytosis: Likely reactive. Will monitor for now--recheck CBC in am. Monitor for fevers. 11. Chronic Thrombocytopenia: Likely reactive. Will monitor for signs of bleeding.  12. Diarrhea: Resolved--will discontinue Senna and miralax for now.   13. Low protein malnutrition: Continue ensure bid for supplement.  14. Abnormal LFTs: On high dose statin---  Recheck LFTs in am.  15. Urethral stricture/Urinary retention: SPC to straight drain.  16. Acute on chronic renal failure: baseline Cr-1.44.  Improved with hydration--IVF discontinued    Post Admission Physician Evaluation: 1. Functional deficits secondary  to deconditioning after urosepsis, as well as non-ST MI. 2. Patient is admitted to receive collaborative, interdisciplinary care between the physiatrist, rehab nursing staff, and therapy team. 3. Patient's level of medical complexity and substantial therapy needs in context of that medical necessity cannot be provided at a lesser intensity of care such as a SNF. 4. Patient has experienced substantial functional loss from his/her baseline which was documented above under the "Functional History" and "Functional Status" headings.  Judging by the patient's diagnosis, physical exam, and functional history, the patient has potential for functional progress which will result in  measurable gains while on inpatient rehab.  These gains will be of substantial and practical use upon discharge  in facilitating mobility and self-care at the household level. 5. Physiatrist will provide 24 hour management of medical needs as well as oversight of the therapy plan/treatment and provide guidance as appropriate regarding the interaction of the two. 6. 24 hour rehab nursing will assist with bladder management, bowel management, safety, skin/wound care, disease management, medication administration, pain management and patient education  and help integrate therapy concepts, techniques,education, etc. 7. PT will assess and treat for/with: pre gait, gait training, endurance , safety, equipment, neuromuscular re education.   Goals are: ModI. OT will assess and treat for/with:  8. .   Goals are: Sup/Mod I. Therapy may proceed with showering this patient. 9. SLP will assess and treat for/with: NA.  Goals are: NA. 10. Case Management and Social Worker will assess and treat for psychological issues and discharge planning. 11. Team conference will be held weekly to assess progress toward goals and to determine barriers to discharge. 12. Patient will receive at least 3 hours of therapy per day at least 5 days per week. 13. ELOS: 13-16d       14. Prognosis:  excellent     Charlett Blake M.D. [facility] FAAPM&R (Sports Med, Neuromuscular Med) Diplomate [place] Board of Electrodiagnostic Med  01/21/2016

## 2016-01-21 NOTE — Care Management Note (Signed)
Case Management Note  Patient Details  Name: Ronald [name] MRN: [id number] Date of Birth: March 13, 1928  Subjective/Objective:                  Spoke with patient and wife in the room, they were recently active with [facility] and would like to use them for [facility] services after DC if needed. Patient lives with wife, independently ambulated prior to admission, but does have walker and shower chair at home. Their son and daughter both live in town, about 20 minutes away per pt's wife. CM spoke with MD, MD states patient to remain inpatient until cultures read out.     Action/Plan:  DC to CIR   Expected Discharge Date:                  Expected Discharge Plan:  [place]  In-House Referral:  NA  Discharge planning Services  CM Consult  Post Acute Care Choice:    Choice offered to:     DME Arranged:    DME Agency:     HH Arranged:    HH Agency:     Status of Service:  Completed, signed off  If discussed at [employer] of Stay Meetings, dates discussed:    Additional Comments:  Carles Collet, RN 01/21/2016, 11:06 AM

## 2016-01-21 NOTE — PMR Pre-admission (Signed)
PMR Admission Coordinator Pre-Admission Assessment  Patient: Ronald [name] is an 82 y.o., male MRN: [id number] DOB: [birth year] Height: 5\' 5"  (165.1 cm) Weight: 73.4 kg (161 lb 13.1 oz)              Insurance Information HMO:    PPO:      PCP:      IPA:      80/20: yes     OTHER: no HMO PRIMARY: Medicare a and b      Policy#: [id number]      Subscriber: pt Benefits:  Phone #: passport one online     Name: 01/21/16 Eff. Date:07/27/94     Deduct: $1316      Out of Pocket Max: none      Life Max: none CIR: 100%      SNF: 20 full days Outpatient: 80%     Co-Pay: 20% Home Health: 100%      Co-Pay: none DME: 80%     Co-Pay: 20% Providers: pt choice  SECONDARY: [employer] [place]      Policy#: [id number]      Subscriber: pt  Medicaid Application Date:       Case Manager:  Disability Application Date:       Case Worker:   Emergency Contact Information Contact Information    Name Relation Home Work Mobile   Knisely,Shirley Spouse [phone]     Walfred, Bett[place]f   [phone]   Greco, Gastelum Daughter   [phone]     Current Medical History  Patient Admitting Diagnosis: debility  History of Present Illness:  Ronald [name] a 82 y.o.malewith history ofBell's palsy, GERD, CAD s/p CABG/ stents (LVEF 45%), HTN, prostate cancer, urethral stricture with chronic UTIs and SPC with recent treatment for UTI. He was admitted on 10/19/17with altered mental status, abdominal pain and hypotension due to urosepsis. Ultrasound of abdomen done revealing continued mild prominence of pancreatic duct and question of mild intrahepatic biliary ductal dilatation. He was started on pressors and IV antibiotics and SPC changed out by urology. Blood cultures positive for citrobacter Braaki and Pseudomonas Aeruginosa. Cardiology consulted for elevated troponin's and ST changes felt to be due to demand ischemia. 2 D echo done for work up and showed EF 40-45% with anterior, apical and basal to mid inferior  wall hypokinesis. Patient asymptomatic and cardiac cath deferred due to thrombocytopenia as well as acute renal failure. He was treated medically and antibiotics adjusted for better coverage. Repeat BC negative and IM recommending patient to continue on cipro X 14 days after last negative culture.    Past Medical History  Past Medical History:  Diagnosis Date  . Arthritis    "joints ache" (01/02/2014)  . At risk for sleep apnea    STOP-BANG= 4    SENT TO PCP 12-23-2013  . Bladder calculi   . CHF (congestive heart failure) ([place])   . Chronic cystitis   . Coronary artery disease CARDIOLOGIST-  DR Grace Bushy  MI -- S/P  11/89 CABG x6 (70% circ; 90% PD; 60-70% distal left main; 30% left circ; 90% 1st diag;, 2nd diag and 3rd diag. w/90%,  mild stenosis LAD and 60-70% pLAD)  Re-do CABG x5 in 1997  . Diverticulosis   . GERD (gastroesophageal reflux disease)   . History of Bell's palsy    RIGHT SIDE-- NO RESIDUAL  . Hx of dizziness   . Hyperlipidemia   . Hypertension    "not anymore" (01/02/2014)  . Ischemic  cardiomyopathy    ef 35-40% per cath 08-28-2013  . Kidney stones "years ago"   "passed them"  . Melanoma of ear ([place])    "right"  . Mild dementia   . Myocardial infarction 1986; 1997  . Nocturia   . OAB (overactive bladder)   . Prostate cancer ([place]) 1998   S/P  [place]; [place]  . S/P CABG (coronary artery bypass graft)    Ronald [name]  . Skipped heart beats    occasional  . Urethral stricture   . UTI (urinary tract infection) 09/2015  . Wears hearing aid    bilateral  . Wears partial dentures     Family History  family history includes Diabetes in his father; Heart disease in his brother and mother.  Prior Rehab/Hospitalizations:  Has the patient had major surgery during 100 days prior to admission? No  Current Medications   Current Facility-Administered Medications:  .  0.9 %  sodium chloride infusion, 250 mL, Intravenous, PRN,  Marijean Heath, NP .  acetaminophen (TYLENOL) tablet 650 mg, 650 mg, Oral, Q6H PRN, Doreatha Lew, MD, 650 mg at 01/20/16 [phone] .  albuterol (PROVENTIL) (2.5 MG/3ML) 0.083% nebulizer solution 2.5 mg, 2.5 mg, Nebulization, Q2H PRN, Marijean Heath, NP .  aspirin EC tablet 81 mg, 81 mg, Oral, Daily, Pixie Casino, MD, 81 mg at 01/20/16 0906 .  atorvastatin (LIPITOR) tablet 80 mg, 80 mg, Oral, QPM, Pixie Casino, MD, 80 mg at 01/20/16 1732 .  ciprofloxacin (CIPRO) tablet 500 mg, 500 mg, Oral, BID, Doreatha Lew, MD, 500 mg at 01/21/16 0748 .  docusate sodium (COLACE) capsule 100 mg, 100 mg, Oral, BID PRN, Brand Males, MD .  feeding supplement (ENSURE ENLIVE) (ENSURE ENLIVE) liquid 237 mL, 237 mL, Oral, BID BM, Waldemar Dickens, MD, 237 mL at 01/20/16 1305 .  Influenza vac split quadrivalent PF (FLUARIX) injection 0.5 mL, 0.5 mL, Intramuscular, Tomorrow-1000, Doreatha Lew, MD .  magic mouthwash w/lidocaine, 5 mL, Oral, TID, Doreatha Lew, MD, 5 mL at 01/20/16 2151 .  MEDLINE mouth rinse, 15 mL, Mouth Rinse, BID, Chesley Mires, MD, 15 mL at 01/20/16 0907 .  polyethylene glycol (MIRALAX / GLYCOLAX) packet 17 g, 17 g, Oral, Daily, Doreatha Lew, MD, 17 g at 01/20/16 0906 .  polyvinyl alcohol (LIQUIFILM TEARS) 1.4 % ophthalmic solution 1 drop, 1 drop, Both Eyes, PRN, Rogue Bussing, MD, 1 drop at 01/15/16 1004 .  senna (SENOKOT) tablet 8.6 mg, 1 tablet, Oral, Daily, Doreatha Lew, MD, 8.6 mg at 01/20/16 4270  Patients Current Diet: Diet Heart Room service appropriate? Yes; Fluid consistency: Thin  Precautions / Restrictions Precautions Precautions: Fall, Other (comment) Precaution Comments: fecal incontinence (on eval) Per pt, not at his baseline Restrictions Weight Bearing Restrictions: No   Has the patient had 2 or more falls or a fall with injury in the past year?No  Prior Activity Level Community (5-7x/wk): Independent and driving pta.  self sufficient, riding mower, yard work, Nurse, adult / [place] Devices/Equipment: Dentures (specify type) Home Equipment: Cane - single point, Civil engineer, contracting, Grab bars - tub/shower, Grab bars - toilet, Walker - 2 wheels  Prior Device Use: Indicate devices/aids used by the patient prior to current illness, exacerbation or injury? None of the above  Prior Functional Level Prior Function Level of Independence: Independent Comments: drives, grocery shops, gardens  Self Care: Did the  patient need help bathing, dressing, using the toilet or eating?  Independent  Indoor Mobility: Did the patient need assistance with walking from room to room (with or without device)? Independent  Stairs: Did the patient need assistance with internal or external stairs (with or without device)? Independent  Functional Cognition: Did the patient need help planning regular tasks such as shopping or remembering to take medications? Independent  Current Functional Level Cognition  Overall Cognitive Status: Within Functional Limits for tasks assessed    Extremity Assessment (includes Sensation/Coordination)  Upper Extremity Assessment: Generalized weakness  Lower Extremity Assessment: Generalized weakness    ADLs       Mobility  Overal bed mobility: Needs Assistance Bed Mobility: Rolling, Sidelying to Sit, Sit to Supine Rolling: Min assist Sidelying to sit: Mod assist, HOB elevated Sit to supine: Min assist General bed mobility comments: with rail, HOB elevated to exit; weak UEs and torso required assist; assist to legs to return to supine    Transfers  Overall transfer level: Needs assistance Equipment used: 1 person hand held assist Transfers: Sit to/from Stand Sit to Stand: Mod assist General transfer comment: significant assist off toilet; less from raised surface    Ambulation / Gait / Stairs / Wheelchair Mobility  Ambulation/Gait Ambulation/Gait assistance:  Mod assist Ambulation Distance (Feet): 35 Feet (toileted, 10 ft) Assistive device: 1 person hand held assist Gait Pattern/deviations: Step-through pattern, Decreased stride length, Narrow base of support, Trunk flexed General Gait Details: Pt became increasingly fatigued (and rushing due to fecal incontinence) on return to room/bathroom with increased physical assist. After seated for toileting and "clean up" x 10 minutes, pt very fatigued with walk back to bed with unsafe reach and turn to sit at foot of bed as approaching.     Posture / Balance Balance Overall balance assessment: Needs assistance Sitting-balance support: No upper extremity supported, Feet supported Sitting balance-Leahy Scale: Fair (when fatigued) Standing balance support: Single extremity supported Standing balance-Leahy Scale: Poor    Special needs/care consideration BiPAP/CPAP  N/a CPM  N/a Continuous Drip IV  N/a Dialysis  N/a Life Vest  N/a Oxygen  N/a Special Bed  N/a Trach Size  N/a Wound Vac (area  N/a Skin  intact Bowel mgmt some incontinence 01/21/16 Bladder mgmt: supra pubic catheter for one year. Pt states he uses a leg bag during the day and switches to gravity bag at night.Likely need education reinforcement on changing bags due to frequent UTIs Diabetic mgmt n/a    Previous Home Environment Living Arrangements: Spouse/significant other  Lives With: Spouse Available Help at Discharge: Family, Available 24 hours/day Type of Home: House Home Layout: One level Home Access: Stairs to enter Entrance Stairs-Rails: Right Entrance Stairs-Number of Steps: 3 Bathroom Shower/Tub: Tub/shower unit, Architectural technologist: Handicapped height Bathroom Accessibility: Yes How Accessible: Accessible via walker [place]:  (Has used [facility] Home care in the past)  Discharge Living Setting Plans for Discharge Living Setting: Patient's home, Lives with (comment) (spouse) Type of Home at Discharge:  House Discharge Home Layout: One level Discharge Home Access: Stairs to enter Entrance Stairs-Rails: Right Entrance Stairs-Number of Steps: 3 Discharge Bathroom Shower/Tub: Tub/shower unit, Curtain Discharge Bathroom Toilet: Handicapped height Discharge Bathroom Accessibility: Yes How Accessible: Accessible via walker Does the patient have any problems obtaining your medications?: No  Social/Family/Support Systems Patient Roles: Spouse, Parent Contact Information: wife, [name] Anticipated Caregiver: wife and family Anticipated Caregiver's Contact Information: see above Ability/Limitations of Caregiver: can provide supervision level Caregiver Availability: 24/7 Discharge  Plan Discussed with Primary Caregiver: Yes Is Caregiver In Agreement with Plan?: Yes Does Caregiver/Family have Issues with Lodging/Transportation while Pt is in Rehab?: No   Goals/Additional Needs Patient/Family Goal for Rehab: Mod I to supervision with PT and OT Expected length of stay: [facility] 10- 12 days Pt/Family Agrees to Admission and willing to participate: Yes Program Orientation Provided & Reviewed with Pt/Caregiver Including Roles  & Responsibilities: Yes  Decrease burden of Care through IP rehab admission: n/a  Possible need for SNF placement upon discharge:not anticipated  Patient Condition: This patient's condition remains as documented in the consult dated 01/20/2016, in which the Rehabilitation Physician determined and documented that the patient's condition is appropriate for intensive rehabilitative care in an inpatient rehabilitation facility. Will admit to inpatient rehab today.   Preadmission Screen Completed By:  Cleatrice Burke, 01/21/2016 8:40 AM ______________________________________________________________________   Discussed status with Dr. Letta Pate on 01/21/2016 at  1017  and received telephone approval for admission today.  Admission Coordinator:  Cleatrice Burke,  time 8206 Date 01/21/2016.

## 2016-01-21 NOTE — Progress Notes (Signed)
Pt admitted to [phone] with all belongings at bedside. Pt is alert and oriented upon arrival and wife is at bedside. RN answered all questions/ concerns and patient is resting comfortably waiting for dinner. Will continue to monitor patient.

## 2016-01-21 NOTE — Progress Notes (Signed)
Plan is for DC to CIR today- CSW signing off  Jorge Ny, [place] Social Worker [phone]

## 2016-01-21 NOTE — Progress Notes (Signed)
Ankit Lorie Phenix, MD Physician Signed Physical Medicine and Rehabilitation  Consult Note Date of Service: 01/20/2016 10:27 AM  Related encounter: ED to Hosp-Admission (Current) from 01/14/2016 in [place] All Collapse All   [] Hide copied text      Physical Medicine and Rehabilitation Consult  Reason for Consult: Debility Referring Physician: Dr. Carles Collet   HPI: Ronald [name] is a 82 y.o. male with history of Bell's palsy, GERD, CAD s/p CABG/ stents (LVEF 45%), HTN, prostate cancer, urethral stricture with chronic UTIs and SPC with recent treatment for UTI. He was admitted on 01/14/16 with altered mental status, abdominal pain and hypotension due to urosepsis.  He was started on pressors and IV antibiotics and SPC changed out by urology. Blood cultures positive for citrobacter Braaki and Pseudomonas Aeruginosa. Cardiology consulted for elevated troponin's and ST changes felt to be due to demand ischemia.  2 D echo done for work up and showed EF 40-45% with anterior, apical and basal to mid inferior wall hypokinesis. Patient asymptomatic and cardiac cath deferred due to thrombocytopenia as well as acute renal failure. He was treated medically and antibiotics adjusted for better coverage. Repeat BC pending. PT evaluation done yesterday and CIR recommended due to debility.   He was independent without AD. Manages his yard.  Does strengthening exercises at home and walks about twice a day (14 minutes on treadmill/quater of mile). Wife supportive and open to therapy in hospital if needed.   Review of Systems  HENT: Positive for hearing loss.   Eyes: Negative for blurred vision and double vision.  Respiratory: Positive for cough and shortness of breath.   Cardiovascular: Negative for chest pain and palpitations.  Gastrointestinal: Positive for diarrhea and heartburn. Negative for abdominal pain.  Genitourinary: Negative for dysuria.  Musculoskeletal:  Positive for falls and myalgias.  Skin: Negative for rash.  Neurological: Negative for dizziness, tremors, focal weakness and headaches.  Psychiatric/Behavioral: The patient is not nervous/anxious.   All other systems reviewed and are negative.         Past Medical History:  Diagnosis Date  . Arthritis    "joints ache" (01/02/2014)  . At risk for sleep apnea    STOP-BANG= 4    SENT TO PCP 12-23-2013  . Bladder calculi   . CHF (congestive heart failure) ([place])   . Chronic cystitis   . Coronary artery disease CARDIOLOGIST-  DR Grace Bushy  MI -- S/P  11/89 CABG x6 (70% circ; 90% PD; 60-70% distal left main; 30% left circ; 90% 1st diag;, 2nd diag and 3rd diag. w/90%,  mild stenosis LAD and 60-70% pLAD)  Re-do CABG x5 in 1997  . Diverticulosis   . GERD (gastroesophageal reflux disease)   . History of Bell's palsy    RIGHT SIDE-- NO RESIDUAL  . Hx of dizziness   . Hyperlipidemia   . Hypertension    "not anymore" (01/02/2014)  . Ischemic cardiomyopathy    ef 35-40% per cath 08-28-2013  . Kidney stones "years ago"   "passed them"  . Melanoma of ear ([place])    "right"  . Mild dementia   . Myocardial infarction 1986; 1997  . Nocturia   . OAB (overactive bladder)   . Prostate cancer ([place]) 1998   S/P  [place]; [place]  . S/P CABG (coronary artery bypass graft)    [place]  . Skipped  heart beats    occasional  . Urethral stricture   . UTI (urinary tract infection) 09/2015  . Wears hearing aid    bilateral  . Wears partial dentures          Past Surgical History:  Procedure Laterality Date  . CARDIAC CATHETERIZATION  02-22-2005  dr Vidal Schwalbe   mild to moderate lv dysfunction with inferobasilar akinesis/  totally occluded SVG to Intermediate Diagonal and SVG to PDA and RCA branches, totally occluded native coronary circulation with diffuse disease pLAD diagonal system with potentially could be  ischemic/  patent SVG to OM with collaterals to dRCA and patent LIMA to LAD and diagonal systemss  . CARDIAC CATHETERIZATION  08-28-2013  DR Daneen Schick   widely patent sequential left internal graft to the diagonal/LAD, widely patent SVG to OM with proximal 50% narrowing noted in the graft, total occlusion SVG's to RCA, RI, and the Diagonal/ LV dysfunction with inferobasal aneurysm and mid anterior wall region of akinesis/  overall EF 35-40%/  Total occlusion of the navtive circulation/  no significant change compared to 2006 cath  . CARDIOVASCULAR STRESS TEST  08-01-2011  dr Angelena Form   inferior scar and possible soft tissue attenuation with minimal peri-infarct ischemia, small region of anterior ischemia and scar/  LVEF 53% LV wall motion with inferior hypokinesis/ no significant change from scan july 2011  . CATARACT EXTRACTION W/ INTRAOCULAR LENS  IMPLANT, BILATERAL Bilateral 2007  . CORONARY ARTERY BYPASS GRAFT  11/ [place]-- 6 vessel/  1997 Re-do 5 vessel  . CYSTOSCOPY WITH URETHRAL DILATATION N/A 12/25/2013   Procedure: CYSTOSCOPY WITH URETHRAL DILATATION, WITH BIOPSY;  Surgeon: Bernestine Amass, MD;  Location: [facility];  Service: Urology;  Laterality: N/A;  . CYSTOSCOPY WITH URETHRAL DILATATION N/A 12/22/2014   Procedure: CYSTOSCOPY WITH URETHRAL DILATATION;  Surgeon: Rana Snare, MD;  Location: [facility];  Service: Urology;  Laterality: N/A;  BALLOON DILATION CATHETER   . EUS  10/05/2011   Procedure: ESOPHAGEAL ENDOSCOPIC ULTRASOUND (EUS) RADIAL;  Surgeon: Arta Silence, MD;  Location: [facility];  Service: Endoscopy;  Laterality: N/A;  . INSERTION OF SUPRAPUBIC CATHETER N/A 12/22/2014   Procedure: INSERTION OF SUPRAPUBIC CATHETER ;  Surgeon: Rana Snare, MD;  Location: [facility];  Service: Urology;  Laterality: N/A;  . LAPAROSCOPIC CHOLECYSTECTOMY  12-23-2005  . LEFT HEART CATHETERIZATION WITH CORONARY/GRAFT ANGIOGRAM N/A 08/28/2013   Procedure:  LEFT HEART CATHETERIZATION WITH Beatrix Fetters;  Surgeon: Sinclair Grooms, MD;  Location: [facility];  Service: Cardiovascular;  Laterality: N/A;  . MELANOMA EXCISION  X 1   "ear"  . RADIOACTIVE PROSTATE SEED IMPLANTS  1998  . SKIN CANCER EXCISION  X 2   "top of head"  . TONSILLECTOMY AND ADENOIDECTOMY  1954         Family History  Problem Relation Age of Onset  . Heart disease Mother   . Diabetes Father   . Heart disease Brother     Social History:  Married--wife supportive and can provide supervision after discharge.  Per reports he quit smoking about 32 years ago. His smoking use included Cigars. He quit after 40.00 years of use. He quit smokeless tobacco use about 2 years ago. His smokeless tobacco use included Chew. He reports that he does not drink alcohol or use drugs.        Allergies  Allergen Reactions  . Pantoprazole Other (See Comments)    Headache and lightheaded  .  Nitrofurantoin Hives  . Sulfa Antibiotics Hives and Itching         Medications Prior to Admission  Medication Sig Dispense Refill  . acetaminophen (TYLENOL) 325 MG tablet Take 325 mg by mouth every 4 (four) hours as needed for fever.    Marland Kitchen acetaminophen (TYLENOL) 500 MG tablet Take 500 mg by mouth every 6 (six) hours as needed for pain or fever.     Marland Kitchen aspirin EC 81 MG EC tablet Take 1 tablet (81 mg total) by mouth daily. 30 tablet 0  . atorvastatin (LIPITOR) 80 MG tablet Take 40 mg by mouth every evening.    . cephALEXin (KEFLEX) 500 MG capsule Take 1 capsule (500 mg total) by mouth 2 (two) times daily. 14 capsule 0  . Cholecalciferol (VITAMIN D) 2000 UNITS tablet Take 2,000 Units by mouth daily.    . flavoxATE (URISPAS) 100 MG tablet Take 100 mg by mouth 3 (three) times daily as needed for bladder spasms.    Marland Kitchen galantamine (RAZADYNE ER) 8 MG 24 hr capsule Take 8 mg by mouth daily with breakfast.    . HYDROcodone-acetaminophen (NORCO/VICODIN) 5-325 MG tablet  Take 1 tablet by mouth every 6 (six) hours as needed for moderate pain.    . hydroxypropyl methylcellulose (ISOPTO TEARS) 2.5 % ophthalmic solution Place 1 drop into both eyes 2 (two) times daily.     . isosorbide mononitrate (IMDUR) 60 MG 24 hr tablet Take 0.5 tablets (30 mg total) by mouth every morning.    . Multiple Vitamin (MULTIVITAMIN WITH MINERALS) TABS Take 1 tablet by mouth daily.    . nitroGLYCERIN (NITROSTAT) 0.4 MG SL tablet Place 0.4 mg under the tongue every 5 (five) minutes as needed for chest pain.       Home: Home Living Family/patient expects to be discharged to:: Private residence Living Arrangements: Spouse/significant other Available Help at Discharge: Family, Available 24 hours/day (children also local) Type of Home: House Home Access: Stairs to enter [employer] of Steps: 3 Entrance Stairs-Rails: Right Home Layout: One level Bathroom Shower/Tub: Tub/shower unit, Architectural technologist: Handicapped height Bathroom Accessibility: Yes Home Equipment: [employer] - single point, Shower seat, Grab bars - tub/shower, Grab bars - toilet, Environmental consultant - 2 wheels  Functional History: Prior Function Level of Independence: Independent Comments: drives, grocery shops, gardens Functional Status:  Mobility: Bed Mobility Overal bed mobility: Needs Assistance Bed Mobility: Rolling, Sidelying to Sit, Sit to Supine Rolling: Min assist Sidelying to sit: Mod assist, HOB elevated Sit to supine: Min assist General bed mobility comments: with rail, HOB elevated to exit; weak UEs and torso required assist; assist to legs to return to supine Transfers Overall transfer level: Needs assistance Equipment used: 1 person hand held assist Transfers: Sit to/from Stand Sit to Stand: Mod assist General transfer comment: significant assist off toilet; less from raised surface Ambulation/Gait Ambulation/Gait assistance: Mod assist Ambulation Distance (Feet): 35 Feet  (toileted, 10 ft) Assistive device: 1 person hand held assist Gait Pattern/deviations: Step-through pattern, Decreased stride length, Narrow base of support, Trunk flexed General Gait Details: Pt became increasingly fatigued (and rushing due to fecal incontinence) on return to room/bathroom with increased physical assist. After seated for toileting and "clean up" x 10 minutes, pt very fatigued with walk back to bed with unsafe reach and turn to sit at foot of bed as approaching.     ADL:    Cognition: Cognition Overall Cognitive Status: Within Functional Limits for tasks assessed Cognition Arousal/Alertness: Awake/alert Behavior During Therapy: [facility]  for tasks assessed/performed Overall Cognitive Status: Within Functional Limits for tasks assessed  Blood pressure 132/62, pulse 66, temperature 98.5 F (36.9 C), temperature source Oral, resp. rate 20, height 5\' 5"  (1.651 m), weight 75.4 kg (166 lb 3.6 oz), SpO2 95 %. Physical Exam  Nursing note and vitals reviewed. Constitutional: He is oriented to person, place, and time. He appears well-developed and well-nourished.  HENT:  Head: Normocephalic and atraumatic.  Poor dentition.   Eyes: Conjunctivae are normal. Pupils are equal, round, and reactive to light.  Neck: Normal range of motion. Neck supple.  Cardiovascular: Normal rate and regular rhythm.   Murmur heard. Respiratory: Effort normal and breath sounds normal. No stridor. No respiratory distress. He has no wheezes.  GI: Soft. Bowel sounds are normal. He exhibits no distension. There is no tenderness.  Musculoskeletal: Normal range of motion. He exhibits no edema or tenderness.  Neurological: He is alert and oriented to person, place, and time.  HOH.  Speech clear.  Motor: 4+/5 proximally, 5/5 distally throughout Sensation intact to light touch  Skin: Skin is warm and dry. No erythema.  Psychiatric: He has a normal mood and affect. His behavior is normal.    Lab Results  Last 24 Hours       Results for orders placed or performed during the hospital encounter of 01/14/16 (from the past 24 hour(s))  Magnesium     Status: None   Collection Time: 01/20/16  7:04 AM  Result Value Ref Range   Magnesium 1.9 1.7 - 2.4 mg/dL  CBC     Status: Abnormal   Collection Time: 01/20/16  7:04 AM  Result Value Ref Range   WBC 13.6 (H) 4.0 - 10.5 K/uL   RBC 4.35 4.22 - 5.81 MIL/uL   Hemoglobin 13.2 13.0 - 17.0 g/dL   HCT 39.2 39.0 - 52.0 %   MCV 90.1 78.0 - 100.0 fL   MCH 30.3 26.0 - 34.0 pg   MCHC 33.7 30.0 - 36.0 g/dL   RDW 14.5 11.5 - 15.5 %   Platelets 126 (L) 150 - 400 K/uL  Renal function panel     Status: Abnormal   Collection Time: 01/20/16  7:04 AM  Result Value Ref Range   Sodium 138 135 - 145 mmol/L   Potassium 4.0 3.5 - 5.1 mmol/L   Chloride 105 101 - 111 mmol/L   CO2 25 22 - 32 mmol/L   Glucose, Bld 119 (H) 65 - 99 mg/dL   BUN 21 (H) 6 - 20 mg/dL   Creatinine, Ser 0.90 0.61 - 1.24 mg/dL   Calcium 9.0 8.9 - 10.3 mg/dL   Phosphorus 3.3 2.5 - 4.6 mg/dL   Albumin 2.4 (L) 3.5 - 5.0 g/dL   GFR calc non Af Amer >60 >60 mL/min   GFR calc Af Amer >60 >60 mL/min   Anion gap 8 5 - 15     Imaging Results (Last 48 hours)  No results found.    Assessment/Plan: Diagnosis: Debility Labs and images independently reviewed.  Records reviewed and summated.  1. Does the need for close, 24 hr/day medical supervision in concert with the patient's rehab needs make it unreasonable for this patient to be served in a less intensive setting? Yes  2. Co-Morbidities requiring supervision/potential complications: Bell's palsy, GERD (cont meds), CAD s/p CABG/ stents (cont meds) HTN (monitor and provide prns in accordance with increased physical exertion and pain), prostate cancer (monitor), urethral stricture with chronic UTIs and SPC (recs per Urology), CHF (  Monitor in accordance with increased physical activity and avoid UE resistance  excercises), thrombocytopenia (Thrombocytopenia < 60,000/mm3 no resistive), bradycardia (monitor HR with increased physical activity), hypoalbuminemia (consider supplementation), leukocytosis (cont to monitor for signs and symptoms of infection, further workup if indicated), fecal incontinence (monitor, workup if persistent) 3. Due to bladder management, bowel management, safety, skin/wound care, disease management and patient education, does the patient require 24 hr/day rehab nursing? Yes 4. Does the patient require coordinated care of a physician, rehab nurse, PT (1-2 hrs/day, 5 days/week) and OT (1-2 hrs/day, 5 days/week) to address physical and functional deficits in the context of the above medical diagnosis(es)? Yes Addressing deficits in the following areas: balance, endurance, locomotion, strength, transferring, bowel/bladder control, bathing, dressing, toileting and psychosocial support 5. Can the patient actively participate in an intensive therapy program of at least 3 hrs of therapy per day at least 5 days per week? Yes 6. The potential for patient to make measurable gains while on inpatient rehab is excellent 7. Anticipated functional outcomes upon discharge from inpatient rehab are modified independent and supervision  with PT, modified independent and supervision with OT, n/a with SLP. 8. Estimated rehab length of stay to reach the above functional goals is: 12-17 days. 9. Does the patient have adequate social supports and living environment to accommodate these discharge functional goals? Yes 10. Anticipated D/C setting: Home 11. Anticipated post D/C treatments: HH therapy and Home excercise program 12. Overall Rehab/Functional Prognosis: good  RECOMMENDATIONS: This patient's condition is appropriate for continued rehabilitative care in the following setting: CIR after completion of medical workup  Patient has agreed to participate in recommended program. Yes Note that insurance  prior authorization may be required for reimbursement for recommended care.  Comment: Rehab Admissions Coordinator to follow up.  Delice Lesch, MD, [facility] 01/20/2016    Revision History                        Routing History

## 2016-01-22 ENCOUNTER — Inpatient Hospital Stay (HOSPITAL_COMMUNITY): Payer: Medicare Other | Admitting: Occupational Therapy

## 2016-01-22 ENCOUNTER — Inpatient Hospital Stay (HOSPITAL_COMMUNITY): Payer: Medicare Other | Admitting: Physical Therapy

## 2016-01-22 DIAGNOSIS — D696 Thrombocytopenia, unspecified: Secondary | ICD-10-CM

## 2016-01-22 DIAGNOSIS — N182 Chronic kidney disease, stage 2 (mild): Secondary | ICD-10-CM

## 2016-01-22 DIAGNOSIS — N183 Chronic kidney disease, stage 3 unspecified: Secondary | ICD-10-CM

## 2016-01-22 DIAGNOSIS — D72820 Lymphocytosis (symptomatic): Secondary | ICD-10-CM

## 2016-01-22 DIAGNOSIS — I214 Non-ST elevation (NSTEMI) myocardial infarction: Secondary | ICD-10-CM

## 2016-01-22 LAB — COMPREHENSIVE METABOLIC PANEL
ALT: 48 U/L (ref 17–63)
AST: 37 U/L (ref 15–41)
Albumin: 2.6 g/dL — ABNORMAL LOW (ref 3.5–5.0)
Alkaline Phosphatase: 209 U/L — ABNORMAL HIGH (ref 38–126)
Anion gap: 8 (ref 5–15)
BUN: 18 mg/dL (ref 6–20)
CO2: 23 mmol/L (ref 22–32)
Calcium: 8.6 mg/dL — ABNORMAL LOW (ref 8.9–10.3)
Chloride: 107 mmol/L (ref 101–111)
Creatinine, Ser: 0.93 mg/dL (ref 0.61–1.24)
GFR calc Af Amer: >60 mL/min (ref >60)
GFR calc non Af Amer: >60 mL/min (ref >60)
Glucose, Bld: 112 mg/dL — ABNORMAL HIGH (ref 65–99)
Potassium: 4.2 mmol/L (ref 3.5–5.1)
Sodium: 138 mmol/L (ref 135–145)
Total Bilirubin: 0.9 mg/dL (ref 0.3–1.2)
Total Protein: 5.2 g/dL — ABNORMAL LOW (ref 6.5–8.1)

## 2016-01-22 MED ORDER — ADULT MULTIVITAMIN W/MINERALS CH
1.0000 | ORAL_TABLET | Freq: Every day | ORAL | Status: DC
  Administered 2016-01-22 – 2016-01-28 (×7): 1 via ORAL
  Filled 2016-01-22 (×7): qty 1

## 2016-01-22 MED ORDER — WHITE PETROLATUM GEL
Status: DC | PRN
  Administered 2016-01-22: 17:00:00 via TOPICAL
  Filled 2016-01-22: qty 28.35
  Filled 2016-01-22: qty 1
  Filled 2016-01-22: qty 28.35

## 2016-01-22 NOTE — Evaluation (Signed)
Physical Therapy Assessment and Plan  Patient Details  Name: Ronald [name] MRN: [id number] Date of Birth: Jan 29, 1928  PT Diagnosis: Difficulty walking and Muscle weakness Rehab Potential: Good ELOS: 5-7 days   Today's Date: 01/22/2016 PT Individual Time: 0930-1040 PT Individual Time Calculation (min): 70 min     Problem List: Patient Active Problem List   Diagnosis Date Noted  . Stage 2 chronic kidney disease   . Bacteremia due to Pseudomonas 01/21/2016  . Debility 01/21/2016  . History of Bell's palsy   . Gastroesophageal reflux disease without esophagitis   . Coronary artery disease involving coronary bypass graft of native heart without angina pectoris   . History of prostate cancer   . Suprapubic catheter ([place])   . Thrombocytopenia ([place])   . Hypoalbuminemia due to protein-calorie malnutrition ([place])   . Lymphocytosis   . Incontinence of feces   . Renal failure syndrome   . Acute respiratory failure with hypoxia ([place])   . Bacteremia   . Abdominal pain   . NSTEMI (non-ST elevated myocardial infarction) ([place]) 01/16/2016  . Altered mental state 01/14/2016  . Septic shock ([place])   . Generalized weakness 10/13/2015  . ARF (acute renal failure) ([place]) 10/13/2015  . Sepsis ([place]) 12/05/2014  . Urinary tract infection associated with cystostomy catheter ([place]) 10/03/2014  . Acute lower UTI 09/10/2014  . Fever 09/10/2014  . Urinary frequency 06/30/2014  . Bacteremia due to Enterococcus 01/22/2014  . SIRS (systemic inflammatory response syndrome) ([place]) 04-27-2013  . Chronic systolic CHF (congestive heart failure) ([place]) 04-27-2013  . Urethral stricture 12/25/2013  . Bladder calculus 12/25/2013  . LV dysfunction 09/23/2013  . Chest pain 08/27/2013  . Unstable angina ([place]) 08/27/2013  . Bacteremia due to Escherichia coli 05/20/2012  . Urinary tract infection 09/15/2011  . Calculus of kidney 09/12/2011  . Bradycardia 10/26/2010  . CHEST WALL PAIN, ACUTE 09/04/2009  . CHILLS  WITHOUT FEVER 02/10/2009  . HYPERCHOLESTEROLEMIA 10/18/2007  . HTN (hypertension) 10/18/2007  . CELLULITIS, FOOT, LEFT 02/27/2007  . OSTEOARTHRITIS 02/07/2007  . ABDOMINAL PAIN 01/01/2007  . BELL'S PALSY, RIGHT 10/26/2006  . Coronary atherosclerosis 10/26/2006  . PROSTATE CANCER, HX OF 10/26/2006  . NEPHROLITHIASIS, HX OF 10/26/2006    Past Medical History:  Past Medical History:  Diagnosis Date  . Arthritis    "joints ache" (01/02/2014)  . At risk for sleep apnea    STOP-BANG= 4    SENT TO PCP 12-23-2013  . Bladder calculi   . CHF (congestive heart failure) ([place])   . Chronic cystitis   . Coronary artery disease CARDIOLOGIST-  DR Grace Bushy  MI -- S/P  11/89 CABG x6 (70% circ; 90% PD; 60-70% distal left main; 30% left circ; 90% 1st diag;, 2nd diag and 3rd diag. w/90%,  mild stenosis LAD and 60-70% pLAD)  Re-do CABG x5 in 1997  . Diverticulosis   . GERD (gastroesophageal reflux disease)   . History of Bell's palsy    RIGHT SIDE-- NO RESIDUAL  . Hx of dizziness   . Hyperlipidemia   . Hypertension    "not anymore" (01/02/2014)  . Ischemic cardiomyopathy    ef 35-40% per cath 08-28-2013  . Kidney stones "years ago"   "passed them"  . Melanoma of ear ([place])    "right"  . Mild dementia   . Myocardial infarction 1986; 1997  . Nocturia   . OAB (overactive bladder)   . Prostate cancer ([place]) 1998   S/P  RADIACTIVE SEED IMPLANTS; UROLOGIST-  DR Risa Grill  . S/P CABG (coronary artery bypass graft)    [place]  . Skipped heart beats    occasional  . Urethral stricture   . UTI (urinary tract infection) 09/2015  . Wears hearing aid    bilateral  . Wears partial dentures    Past Surgical History:  Past Surgical History:  Procedure Laterality Date  . CARDIAC CATHETERIZATION  02-22-2005  dr Vidal Schwalbe   mild to moderate lv dysfunction with inferobasilar akinesis/  totally occluded SVG to Intermediate Diagonal and SVG to PDA and RCA branches, totally occluded native  coronary circulation with diffuse disease pLAD diagonal system with potentially could be ischemic/  patent SVG to OM with collaterals to dRCA and patent LIMA to LAD and diagonal systemss  . CARDIAC CATHETERIZATION  08-28-2013  DR Daneen Schick   widely patent sequential left internal graft to the diagonal/LAD, widely patent SVG to OM with proximal 50% narrowing noted in the graft, total occlusion SVG's to RCA, RI, and the Diagonal/ LV dysfunction with inferobasal aneurysm and mid anterior wall region of akinesis/  overall EF 35-40%/  Total occlusion of the navtive circulation/  no significant change compared to 2006 cath  . CARDIOVASCULAR STRESS TEST  08-01-2011  dr Angelena Form   inferior scar and possible soft tissue attenuation with minimal peri-infarct ischemia, small region of anterior ischemia and scar/  LVEF 53% LV wall motion with inferior hypokinesis/ no significant change from scan july 2011  . CATARACT EXTRACTION W/ INTRAOCULAR LENS  IMPLANT, BILATERAL Bilateral 2007  . CORONARY ARTERY BYPASS GRAFT  11/ [place]-- 6 vessel/  1997 Re-do 5 vessel  . CYSTOSCOPY WITH URETHRAL DILATATION N/A 12/25/2013   Procedure: CYSTOSCOPY WITH URETHRAL DILATATION, WITH BIOPSY;  Surgeon: Bernestine Amass, MD;  [facility];  Service: Urology;  Laterality: N/A;  . CYSTOSCOPY WITH URETHRAL DILATATION N/A 12/22/2014   Procedure: CYSTOSCOPY WITH URETHRAL DILATATION;  Surgeon: Rana Snare, MD;  Location: [facility];  Service: Urology;  Laterality: N/A;  BALLOON DILATION CATHETER    . EUS  10/05/2011   Procedure: ESOPHAGEAL ENDOSCOPIC ULTRASOUND (EUS) RADIAL;  Surgeon: Arta Silence, MD;  Location: [facility];  Service: Endoscopy;  Laterality: N/A;  . INSERTION OF SUPRAPUBIC CATHETER N/A 12/22/2014   Procedure: INSERTION OF SUPRAPUBIC CATHETER ;  Surgeon: Rana Snare, MD;  Location: [facility];  Service: Urology;  Laterality: N/A;  . LAPAROSCOPIC CHOLECYSTECTOMY  12-23-2005  . LEFT  HEART CATHETERIZATION WITH CORONARY/GRAFT ANGIOGRAM N/A 08/28/2013   Procedure: LEFT HEART CATHETERIZATION WITH Beatrix Fetters;  Surgeon: Sinclair Grooms, MD;  Location[facility] C[facility] LAB;  Service: Cardiovascular;  Laterality: N/A;  . MELANOMA EXCISION  X 1   "ear"  . RADIOACTIVE PROSTATE SEED IMPLANTS  1998  . SKIN CANCER EXCISION  X 2   "top of head"  . TONSILLECTOMY AND ADENOIDECTOMY  1954    Assessment & Plan Clinical Impression: Patient is a 82 y.o. year old male with recent admission to the hospital on 10/19/17with altered mental status, abdominal pain and hypotension due to urosepsis. Ultrasound of abdomen done revealing continued mild prominence of pancreatic duct and question of mild intrahepatic biliary ductal dilatation. He was started on pressors and IV antibiotics and SPC changed out by urology. Blood cultures positive for citrobacter Braaki and Pseudomonas Aeruginosa. Cardiology consulted for elevated troponin's and ST changes felt to be due to demand ischemia. 2 D echo done  for work up and showed EF 40-45% with anterior, apical and basal to mid inferior wall hypokinesis. Patient asymptomatic and cardiac cath deferred due to thrombocytopenia as well as acute renal failure  Patient transferred to CIR on 01/21/2016 .   Patient currently requires min with mobility secondary to muscle weakness, decreased cardiorespiratoy endurance and decreased standing balance and decreased balance strategies.  Prior to hospitalization, patient was independent  with mobility and lived with Spouse in a House home.  Home access is 3Stairs to enter.  Patient will benefit from skilled PT intervention to maximize safe functional mobility, minimize fall risk and decrease caregiver burden for planned discharge home with intermittent assist.  Anticipate patient will benefit from follow up [facility] at discharge.  PT - End of Session Activity Tolerance: Tolerates 30+ min activity with multiple rests Endurance  Deficit: Yes PT Assessment Rehab Potential (ACUTE/IP ONLY): Good PT Patient demonstrates impairments in the following area(s): Balance;Endurance;Motor;Safety PT Transfers Functional Problem(s): Bed Mobility;Bed to Chair;Car PT Locomotion Functional Problem(s): Ambulation;Stairs PT Plan PT Intensity: Minimum of 1-2 x/day ,45 to 90 minutes PT Frequency: 5 out of 7 days PT Duration Estimated Length of Stay: 5-7 days PT Treatment/Interventions: Ambulation/gait training;Discharge planning;DME/adaptive equipment instruction;Functional mobility training;Pain management;Splinting/orthotics;Therapeutic Activities;UE/LE Strength taining/ROM;Wheelchair propulsion/positioning;UE/LE Coordination activities;Therapeutic Exercise;Stair training;Patient/family education;Neuromuscular re-education;Community reintegration;Balance/vestibular training PT Transfers Anticipated Outcome(s): mod I PT Locomotion Anticipated Outcome(s): mod I PT Recommendation Follow Up Recommendations: Home health PT Patient destination: Home Equipment Recommended: To be determined  Skilled Therapeutic Intervention Pt performed gait without device with min A, wide BOS, decreased cadence in controlled environment multiple reps up to 75'.  Stair negotiation with 2 handrails min A x 4 stairs.  Berg balance test performed, pt scored 19/56, high fall risk. Pt limited by fatigue.  Standing balance therex, initiated [place] HEP to pt tolerance.  Gait on uneven surface and up/down ramp with min A.  Simulated car transfer to truck with running board with pt able to perform with min A, cues for safety. PT encouraged pt to travel in his wife's sedan due to weakness, pt states "I like my truck."  Nu step for activity tolerance x 8 minutes level 3. Pt performed sitting and standing balance for dressing with min A for standing to pull up pants. Multiple sit to stands with min A.  Pt and wife educated on PT POC and goals for treatment, both express  understanding.  PT Evaluation Precautions/Restrictions Precautions Precautions: Fall Restrictions Weight Bearing Restrictions: No Pain Pain Assessment Pain Assessment: No/denies pain Home Living/Prior Functioning Home Living Available Help at Discharge: Family;Available 24 hours/day Type of Home: House Home Access: Stairs to enter [employer] of Steps: 3 Entrance Stairs-Rails: Left Home Layout: One level  Lives With: Spouse Prior Function Level of Independence: Independent with basic ADLs;Independent with transfers;Independent with gait  Able to Take Stairs?: Yes Driving: Yes Comments: pt states he did use a cane on uneven surfaces  Cognition Overall Cognitive Status: Within Functional Limits for tasks assessed Orientation Level: Oriented X4 Sensation Sensation Light Touch: Appears Intact Proprioception: Appears Intact Coordination Gross Motor Movements are Fluid and Coordinated: Yes Motor  Motor Motor - Skilled Clinical Observations: generalized weakness   Trunk/Postural Assessment  Cervical Assessment Cervical Assessment:  (forward head) Thoracic Assessment Thoracic Assessment:  (kyphosis) Lumbar Assessment Lumbar Assessment:  (posterior pelvic tilt) Postural Control Postural Control: Deficits on evaluation Righting Reactions: delayed  Balance Standardized Balance Assessment Standardized Balance Assessment: Berg Balance Test Berg Balance Test Sit to Stand: Able to stand  independently  using hands Standing Unsupported: Able to stand 2 minutes with supervision Sitting with Back Unsupported but Feet Supported on Floor or Stool: Able to sit safely and securely 2 minutes Stand to Sit: Uses backs of legs against chair to control descent Transfers: Needs one person to assist Standing Unsupported with Eyes Closed: Able to stand 3 seconds Standing Ubsupported with Feet Together: Needs help to attain position and unable to hold for 15 seconds From  Standing, Reach Forward with Outstretched Arm: Loses balance while trying/requires external support From Standing Position, Pick up Object from Floor: Able to pick up shoe, needs supervision From Standing Position, Turn to Look Behind Over each Shoulder: Needs assist to keep from losing balance and falling Turn 360 Degrees: Needs assistance while turning Standing Unsupported, Alternately Place Feet on Step/Stool: Able to complete >2 steps/needs minimal assist Standing Unsupported, One Foot in Front: Loses balance while stepping or standing Standing on One Leg: Unable to try or needs assist to prevent fall Total Score: 19 Extremity Assessment      RLE Assessment RLE Assessment:  (grossly 3/5) LLE Assessment LLE Assessment:  (grossly 3/5)   See Function Navigator for Current Functional Status.   Refer to Care Plan for Long Term Goals  Recommendations for other services: None  Discharge Criteria: Patient will be discharged from PT if patient refuses treatment 3 consecutive times without medical reason, if treatment goals not met, if there is a change in medical status, if patient makes no progress towards goals or if patient is discharged from hospital.  The above assessment, treatment plan, treatment alternatives and goals were discussed and mutually agreed upon: by patient  Maleni Seyer 01/22/2016, 10:45 AM

## 2016-01-22 NOTE — Progress Notes (Signed)
Physical Therapy Session Note  Patient Details  Name: Ronald [name] MRN: [id number] Date of Birth: 10-28-1927  Today's Date: 01/22/2016 PT Individual Time: 1430-1530 PT Individual Time Calculation (min): 60 min    Short Term Goals: Week 1:     Skilled Therapeutic Interventions/Progress Updates:     Pt received supine in bed and agreeable to PT. Supine>sit with supervision Assist from PT.  WC mobility for 167ft x 2 with supervision Assist from PT. Min cues provided for doorway management and safety through crowded environment.   Gait on uneven surface of cement sidewalk instructed by PT with RW with min-supervision Assist x 129ft.   Standing therex including hip flexion, hip abduciotn, hip extension. All therex completed with BUE support on RW with min assist due to fear of knee instability.    Bed mobility with supervision Assist from PT including roll L and R as well as supine<>sit with min cues for improved use of BUE.   Sit<>stand with RW x 10 throughout treatment with supervision Assist from PT.  All stand pivot transfers completed with close supervision Assist and min cues for improved use of BUE to push from [facility].   Patient left sitting in [facility] with call bell in room.    Therapy Documentation Precautions:  Precautions Precautions: Fall Restrictions Weight Bearing Restrictions: No General:   Vital Signs: Therapy Vitals Temp: (!) 96 F (35.6 C) Temp Source: Oral Pulse Rate: 61 Resp: 18 BP: (!) 108/44 Patient Position (if appropriate): Lying Oxygen Therapy SpO2: 95 % O2 Device: Not Delivered   See Function Navigator for Current Functional Status.   Therapy/Group: Individual Therapy  Lorie Phenix 01/22/2016, 3:35 PM

## 2016-01-22 NOTE — Progress Notes (Signed)
Patient information reviewed and entered into eRehab system by Noralyn Karim, RN, CRRN, PPS Coordinator.  Information including medical coding and functional independence measure will be reviewed and updated through discharge.     Per nursing patient was given "Data Collection Information Summary for Patients in Inpatient Rehabilitation Facilities with attached "Privacy Act Statement-Health Care Records" upon admission.  

## 2016-01-22 NOTE — IPOC Note (Signed)
Overall Plan of Care [facility]) Patient Details Name: Ronald [name] MRN: [id number] DOB: 1927-05-18  Admitting Diagnosis: debility   sepsis  Hospital Problems: Active Problems:   Debility   Stage 2 chronic kidney disease     Functional Problem List: Nursing Bladder, Bowel, Endurance, Medication Management, Motor, Nutrition, Pain, Perception, Safety, Sensory, Skin Integrity  PT Balance, Endurance, Motor, Safety  OT Balance, Safety, Endurance  SLP    TR         Basic ADL's: OT Bathing, Dressing, Toileting     Advanced  ADL's: OT       Transfers: PT Bed Mobility, Bed to Chair, Teacher, early years/pre, Tub/Shower     Locomotion: PT Ambulation, Stairs     Additional Impairments: OT    SLP        TR      Anticipated Outcomes Item Anticipated Outcome  Self Feeding N/A  Swallowing      Basic self-care  Mod I  Toileting  Mod I    Bathroom Transfers Supervision   Bowel/Bladder  Pt will manage bowel and bladder with min assist   Transfers  mod I  Locomotion  mod I  Communication     Cognition     Pain  Pt will rate pain at 4 or less on a scale of 0-10.   Safety/Judgment  Pt will remain free of falls and injury with min assist    Therapy Plan: PT Intensity: Minimum of 1-2 x/day ,45 to 90 minutes PT Frequency: 5 out of 7 days PT Duration Estimated Length of Stay: 5-7 days OT Intensity: Minimum of 1-2 x/day, 45 to 90 minutes OT Frequency: 5 out of 7 days OT Duration/Estimated Length of Stay: 5-7 days         Team Interventions: Nursing Interventions Patient/Family Education, Bladder Management, Bowel Management, Disease Management/Prevention, Pain Management, Medication Management, Skin Care/Wound Management, Cognitive Remediation/Compensation, Discharge Planning  PT interventions Ambulation/gait training, Discharge planning, DME/adaptive equipment instruction, Functional mobility training, Pain management, Splinting/orthotics, Therapeutic Activities, UE/LE  Strength taining/ROM, Wheelchair propulsion/positioning, UE/LE Coordination activities, Therapeutic Exercise, Stair training, Patient/family education, Neuromuscular re-education, Community reintegration, Training and development officer  OT Interventions Discharge planning, Self Care/advanced ADL retraining, Therapeutic Activities, Functional mobility training, Patient/family education, Therapeutic Exercise, DME/adaptive equipment instruction, UE/LE Strength taining/ROM  SLP Interventions    TR Interventions    SW/CM Interventions Discharge Planning, Psychosocial Support, Patient/Family Education    Team Discharge Planning: Destination: PT-Home ,OT- Home , SLP-  Projected Follow-up: PT-Home health PT, OT-  Home health OT, SLP-  Projected Equipment Needs: PT-To be determined, OT- To be determined, SLP-  Equipment Details: PT- , OT-  Patient/family involved in discharge planning: PT- Patient,  OT-Patient, SLP-   MD ELOS: 5-7 days Medical Rehab Prognosis:  Excellent Assessment: 82 y.o.malewith history ofBell's palsy, GERD, CAD s/p CABG/ stents (LVEF 45%), HTN, prostate cancer, urethral stricture with chronic UTIs and SPC with recent treatment for UTI. He was admitted on 10/19/17with altered mental status, abdominal pain and hypotension due to urosepsis. Ultrasound of abdomen done revealing continued mild prominence of pancreatic duct and question of mild intrahepatic biliary ductal dilatation. He was started on pressors and IV antibiotics and SPC changed out by urology. Blood cultures positive for citrobacter Braaki and Pseudomonas Aeruginosa. Cardiology consulted for elevated troponin's and ST changes felt to be due to demand ischemia. 2 D echo done for work up and showed EF 40-45% with anterior, apical and basal to mid inferior wall hypokinesis. Patient asymptomatic and cardiac  cath deferred due to thrombocytopenia as well as acute renal failure. He was treated medically and antibiotics adjusted for  better coverage. Repeat [facility] 10/23 remains pending and IM recommended patient to continue on cipro X 14 days after last negative culture--thorough 11/5. Pt with resulting deficits with mobility, safety, balance, endurance.  Will set goals for Mod I for most tasks with PT/OT.    See Team Conference Notes for weekly updates to the plan of care

## 2016-01-22 NOTE — Evaluation (Signed)
Occupational Therapy Assessment and Plan  Patient Details  Name: Ronald [name] MRN: [id number] Date of Birth: Dec 01, 1927  OT Diagnosis: abnormal posture and muscle weakness (generalized) Rehab Potential: Rehab Potential (ACUTE ONLY): Excellent ELOS: 5-7 days   Today's Date: 01/22/2016 OT Individual Time:  - 9292-4462 Individual Treatment Time Calculation: 65 minutes    Problem List:  Patient Active Problem List   Diagnosis Date Noted  . Stage 2 chronic kidney disease   . Bacteremia due to Pseudomonas 01/21/2016  . Debility 01/21/2016  . History of Bell's palsy   . Gastroesophageal reflux disease without esophagitis   . Coronary artery disease involving coronary bypass graft of native heart without angina pectoris   . History of prostate cancer   . Suprapubic catheter ([place])   . Thrombocytopenia ([place])   . Hypoalbuminemia due to protein-calorie malnutrition ([place])   . Lymphocytosis   . Incontinence of feces   . Renal failure syndrome   . Acute respiratory failure with hypoxia ([place])   . Bacteremia   . Abdominal pain   . NSTEMI (non-ST elevated myocardial infarction) ([place]) 01/16/2016  . Altered mental state 01/14/2016  . Septic shock ([place])   . Generalized weakness 10/13/2015  . ARF (acute renal failure) ([place]) 10/13/2015  . Sepsis ([place]) 12/05/2014  . Urinary tract infection associated with cystostomy catheter ([place]) 10/03/2014  . Acute lower UTI 09/10/2014  . Fever 09/10/2014  . Urinary frequency 06/30/2014  . Bacteremia due to Enterococcus 01/22/2014  . SIRS (systemic inflammatory response syndrome) ([place]) 2020-02-2114  . Chronic systolic CHF (congestive heart failure) ([place]) 2020-02-2114  . Urethral stricture 12/25/2013  . Bladder calculus 12/25/2013  . LV dysfunction 09/23/2013  . Chest pain 08/27/2013  . Unstable angina ([place]) 08/27/2013  . Bacteremia due to Escherichia coli 05/20/2012  . Urinary tract infection 09/15/2011  . Calculus of kidney 09/12/2011  . Bradycardia  10/26/2010  . CHEST WALL PAIN, ACUTE 09/04/2009  . CHILLS WITHOUT FEVER 02/10/2009  . HYPERCHOLESTEROLEMIA 10/18/2007  . HTN (hypertension) 10/18/2007  . CELLULITIS, FOOT, LEFT 02/27/2007  . OSTEOARTHRITIS 02/07/2007  . ABDOMINAL PAIN 01/01/2007  . BELL'S PALSY, RIGHT 10/26/2006  . Coronary atherosclerosis 10/26/2006  . PROSTATE CANCER, HX OF 10/26/2006  . NEPHROLITHIASIS, HX OF 10/26/2006    Past Medical History:  Past Medical History:  Diagnosis Date  . Arthritis    "joints ache" (01/02/2014)  . At risk for sleep apnea    STOP-BANG= 4    SENT TO PCP 12-23-2013  . Bladder calculi   . CHF (congestive heart failure) ([place])   . Chronic cystitis   . Coronary artery disease CARDIOLOGIST-  DR Grace Bushy  MI -- S/P  11/89 CABG x6 (70% circ; 90% PD; 60-70% distal left main; 30% left circ; 90% 1st diag;, 2nd diag and 3rd diag. w/90%,  mild stenosis LAD and 60-70% pLAD)  Re-do CABG x5 in 1997  . Diverticulosis   . GERD (gastroesophageal reflux disease)   . History of Bell's palsy    RIGHT SIDE-- NO RESIDUAL  . Hx of dizziness   . Hyperlipidemia   . Hypertension    "not anymore" (01/02/2014)  . Ischemic cardiomyopathy    ef 35-40% per cath 08-28-2013  . Kidney stones "years ago"   "passed them"  . Melanoma of ear ([place])    "right"  . Mild dementia   . Myocardial infarction 1986; 1997  . Nocturia   . OAB (overactive bladder)   . Prostate cancer ([place]) 1998  S/P  RADIACTIVE SEED IMPLANTS; UROLOGIST-  DR GRAPEY  . S/P CABG (coronary artery bypass graft)    [place]  . Skipped heart beats    occasional  . Urethral stricture   . UTI (urinary tract infection) 09/2015  . Wears hearing aid    bilateral  . Wears partial dentures    Past Surgical History:  Past Surgical History:  Procedure Laterality Date  . CARDIAC CATHETERIZATION  02-22-2005  dr Vidal Schwalbe   mild to moderate lv dysfunction with inferobasilar akinesis/  totally occluded SVG to Intermediate Diagonal and  SVG to PDA and RCA branches, totally occluded native coronary circulation with diffuse disease pLAD diagonal system with potentially could be ischemic/  patent SVG to OM with collaterals to dRCA and patent LIMA to LAD and diagonal systemss  . CARDIAC CATHETERIZATION  08-28-2013  DR Daneen Schick   widely patent sequential left internal graft to the diagonal/LAD, widely patent SVG to OM with proximal 50% narrowing noted in the graft, total occlusion SVG's to RCA, RI, and the Diagonal/ LV dysfunction with inferobasal aneurysm and mid anterior wall region of akinesis/  overall EF 35-40%/  Total occlusion of the navtive circulation/  no significant change compared to 2006 cath  . CARDIOVASCULAR STRESS TEST  08-01-2011  dr Angelena Form   inferior scar and possible soft tissue attenuation with minimal peri-infarct ischemia, small region of anterior ischemia and scar/  LVEF 53% LV wall motion with inferior hypokinesis/ no significant change from scan july 2011  . CATARACT EXTRACTION W/ INTRAOCULAR LENS  IMPLANT, BILATERAL Bilateral 2007  . CORONARY ARTERY BYPASS GRAFT  11/ [place]-- 6 vessel/  1997 Re-do 5 vessel  . CYSTOSCOPY WITH URETHRAL DILATATION N/A 12/25/2013   Procedure: CYSTOSCOPY WITH URETHRAL DILATATION, WITH BIOPSY;  Surgeon: Bernestine Amass, MD;  Location: [facility];  Service: Urology;  Laterality: N/A;  . CYSTOSCOPY WITH URETHRAL DILATATION N/A 12/22/2014   Procedure: CYSTOSCOPY WITH URETHRAL DILATATION;  Surgeon: Rana Snare, MD;  Location: [facility];  Service: Urology;  Laterality: N/A;  BALLOON DILATION CATHETER    . EUS  10/05/2011   Procedure: ESOPHAGEAL ENDOSCOPIC ULTRASOUND (EUS) RADIAL;  Surgeon: Arta Silence, MD;  Location: [facility];  Service: Endoscopy;  Laterality: N/A;  . INSERTION OF SUPRAPUBIC CATHETER N/A 12/22/2014   Procedure: INSERTION OF SUPRAPUBIC CATHETER ;  Surgeon: Rana Snare, MD;  Location: [facility];  Service: Urology;  Laterality: N/A;  .  LAPAROSCOPIC CHOLECYSTECTOMY  12-23-2005  . LEFT HEART CATHETERIZATION WITH CORONARY/GRAFT ANGIOGRAM N/A 08/28/2013   Procedure: LEFT HEART CATHETERIZATION WITH Beatrix Fetters;  Surgeon: Sinclair Grooms, MD;  Location: [facility] C[facility] LAB;  Service: Cardiovascular;  Laterality: N/A;  . MELANOMA EXCISION  X 1   "ear"  . RADIOACTIVE PROSTATE SEED IMPLANTS  1998  . SKIN CANCER EXCISION  X 2   "top of head"  . TONSILLECTOMY AND ADENOIDECTOMY  1954    Assessment & Plan Clinical Impression: Ronald [name] a 82 y.o.malewith history ofBell's palsy, GERD, CAD s/p CABG/ stents (LVEF 45%), HTN, prostate cancer, urethral stricture with chronic UTIs and SPC with recent treatment for UTI. He was admitted on 10/19/17with altered mental status, abdominal pain and hypotension due to urosepsis. Ultrasound of abdomen done revealing continued mild prominence of pancreatic duct and question of mild intrahepatic biliary ductal dilatation. He was started on pressors and IV antibiotics and SPC changed out by urology. Blood  cultures positive for citrobacter Braaki and Pseudomonas Aeruginosa. Cardiology consulted for elevated troponin's and ST changes felt to be due to demand ischemia. 2 D echo done for work up and showed EF 40-45% with anterior, apical and basal to mid inferior wall hypokinesis. Patient asymptomatic and cardiac cath deferred due to thrombocytopenia as well as acute renal failure. He was treated medically and antibiotics adjusted for better coverage. Repeat [facility] 10/23 pending and IM recommending patient to continue on cipro X 14 days after last negative culture--thorough 11/5. PT evaluation done 10/24 showing patient to be debilitated and CIR recommended for follow up therapy.   Patient currently requires mod with basic self-care skills secondary to muscle weakness, decreased cardiorespiratoy endurance and decreased postural control.  Prior to hospitalization, patient could complete BADLs with  modified independent .  Patient will benefit from skilled intervention to increase independence with basic self-care skills prior to discharge home with care partner.  Anticipate patient will require intermittent supervision and follow up home health.  OT - End of Session Endurance Deficit: Yes OT Assessment Rehab Potential (ACUTE ONLY): Excellent Barriers to Discharge:  (N/A) OT Patient demonstrates impairments in the following area(s): Balance;Safety;Endurance OT Basic ADL's Functional Problem(s): Bathing;Dressing;Toileting OT Transfers Functional Problem(s): Toilet;Tub/Shower OT Plan OT Intensity: Minimum of 1-2 x/day, 45 to 90 minutes OT Frequency: 5 out of 7 days OT Duration/Estimated Length of Stay: 5-7 days OT Treatment/Interventions: Discharge planning;Self Care/advanced ADL retraining;Therapeutic Activities;Functional mobility training;Patient/family education;Therapeutic Exercise;DME/adaptive equipment instruction;UE/LE Strength taining/ROM OT Self Feeding Anticipated Outcome(s): N/A OT Basic Self-Care Anticipated Outcome(s): Mod I OT Toileting Anticipated Outcome(s): Mod I  OT Bathroom Transfers Anticipated Outcome(s): Supervision  OT Recommendation Patient destination: Home Follow Up Recommendations: Home health OT Equipment Recommended: To be determined   Skilled Therapeutic Intervention 1:1 Initial OT evaluation completed with focus on activity tolerance, balance, and safety awareness. Pt was lying in bed at time of arrival with nursing present. Education provided regarding OT role/POC and CIR with verbalized understanding. Mod A sit<stand and Mod A for ambulating to shower bench with HHA. Pt exhibited strong right lean when standing and required tactile cues to address. Right hand IV covered during shower. Pt required Mod A for ADLs due to difficultly with reaching bilateral LEs. Pt reported using footstool at home for dressing and being able to alternatively prop LEs up on  knee when bathing.  Pt completed grooming/oral care with supervision while seated. Rest breaks provided during session to address fatigue and global weakness. Safety cues provided for hand placement during sit<stands. At end of tx pt was oriented to call bell and left seated in w/c with all needs within reach.   OT Evaluation Precautions/Restrictions  Precautions Precautions: Fall Restrictions Weight Bearing Restrictions: No General Chart Reviewed: Yes Family/Caregiver Present: No Vital Signs Therapy Vitals Temp: (!) 96 F (35.6 C) Temp Source: Oral Pulse Rate: 61 Resp: 18 BP: (!) 108/44 Patient Position (if appropriate): Lying Oxygen Therapy SpO2: 95 % O2 Device: Not Delivered Pain   Home Living/Prior Functioning Home Living Family/patient expects to be discharged to:: Private residence Living Arrangements: Spouse/significant other Available Help at Discharge: Family, Available 24 hours/day Type of Home: House Home Access: Stairs to enter [employer] of Steps: 3 Entrance Stairs-Rails: Left Home Layout: One level Bathroom Shower/Tub: Tub/shower unit, Architectural technologist: Handicapped height Bathroom Accessibility: Yes  Lives With: Spouse IADL History Mode of Transportation: Musician Occupation: Retired Leisure and Hobbies: Yard work, Engineer, maintenance IADL Comments: Pt reports being very involved with church family in  community Prior Function Level of Independence: Independent with basic ADLs, Independent with transfers, Independent with gait  Able to Take Stairs?: Yes Driving: Yes Comments: pt states he did use a cane on uneven surfaces ADL ADL ADL Comments: Please see functional navigator for ADL status Vision/Perception  Vision- History Baseline Vision/History: Wears glasses Wears Glasses: Distance only Patient Visual Report: No change from baseline Vision- Assessment Vision Assessment?: Yes Eye Alignment: Within Functional Limits Tracking/Visual  Pursuits: Decreased smoothness of horizontal tracking Convergence: Impaired - to be further tested in functional context Diplopia Assessment: Other (comment) (Pt reports experiencing diploplia at times in community; to be further assessed in functional context)  Cognition Overall Cognitive Status: Within Functional Limits for tasks assessed Arousal/Alertness: Awake/alert Orientation Level: Person;Place;Situation Person: Oriented Place: Oriented Situation: Oriented Year: 2017 Month: October Day of Week: Correct Memory: Appears intact Immediate Memory Recall: Sock;Blue;Bed Memory Recall: Sock;Blue;Bed Memory Recall Sock: Without Cue Memory Recall Blue: Without Cue Memory Recall Bed: Without Cue Attention: Sustained Sustained Attention: Appears intact Awareness: Appears intact Problem Solving: Appears intact Safety/Judgment: Impaired Sensation Sensation Light Touch: Appears Intact Stereognosis: Not tested Hot/Cold: Appears Intact Proprioception: Appears Intact Coordination Gross Motor Movements are Fluid and Coordinated: Yes Fine Motor Movements are Fluid and Coordinated: Yes Motor  Motor Motor: Other (comment) (global weakness and decreased postural control) Mobility  Bed Mobility Bed Mobility: Supine to Sit Supine to Sit: 4: Min guard  Trunk/Postural Assessment  Cervical Assessment Cervical Assessment: Exceptions to [facility] (forward head) Thoracic Assessment Thoracic Assessment: Exceptions to [facility] (Kyphosis) Lumbar Assessment Lumbar Assessment: Exceptions to [facility] (Posterior pelvic tilt) Postural Control Postural Control: Deficits on evaluation (strong right lean)  Balance Balance Balance Assessed: Yes Extremity/Trunk Assessment RUE Assessment RUE Assessment: Exceptions to [facility] (3+/5) LUE Assessment LUE Assessment: Exceptions to [facility] (3+/5)   See Function Navigator for Current Functional Status.   Refer to Care Plan for Long Term Goals  Recommendations for other  services: None  Discharge Criteria: Patient will be discharged from OT if patient refuses treatment 3 consecutive times without medical reason, if treatment goals not met, if there is a change in medical status, if patient makes no progress towards goals or if patient is discharged from hospital.  The above assessment, treatment plan, treatment alternatives and goals were discussed and mutually agreed upon: by patient  Skeet Simmer 01/22/2016, 5:02 PM

## 2016-01-22 NOTE — Progress Notes (Signed)
[place] PHYSICAL MEDICINE & REHABILITATION     PROGRESS NOTE  Subjective/Complaints:  Pt seen sitting up in bed eating breakfast.  He states he slept on/off overnight.  He is ready to get started with therapies.   ROS: Denies CP, SOB, N/V/D.  Objective: Vital Signs: Blood pressure (!) 120/47, pulse (!) 53, temperature 98.2 F (36.8 C), temperature source Oral, resp. rate 18, height 5\' 5"  (1.651 m), weight 67.2 kg (148 lb 2.4 oz), SpO2 95 %. No results found.  Recent Labs  01/21/16 0404 01/22/16 0719  WBC 12.7* 11.8*  HGB 14.0 13.2  HCT 41.6 40.3  PLT 86* 167    Recent Labs  01/20/16 0704 01/22/16 0719  NA 138 138  K 4.0 4.2  CL 105 107  GLUCOSE 119* 112*  BUN 21* 18  CREATININE 0.90 0.93  CALCIUM 9.0 8.6*   CBG (last 3)  No results for input(s): GLUCAP in the last 72 hours.  Wt Readings from Last 3 Encounters:  01/21/16 67.2 kg (148 lb 2.4 oz)  01/21/16 73.4 kg (161 lb 13.1 oz)  10/26/15 69.7 kg (153 lb 9.6 oz)    Physical Exam:  BP (!) 120/47 (BP Location: Right Arm)   Pulse (!) 53   Temp 98.2 F (36.8 C) (Oral)   Resp 18   Ht 5\' 5"  (1.651 m)   Wt 67.2 kg (148 lb 2.4 oz)   SpO2 95%   BMI 24.65 kg/m  Constitutional: He appears well-developedand well-nourished. NAD. HENT: Normocephalicand atraumatic.  Mouth/Throat: Poor dentition.  Eyes: Conjunctivaeand EOM are normal.  Cardiovascular: RRR. No JVD. Murmurheard. Respiratory: Effort normaland breath sounds normal. No stridor.  GI: Soft. Bowel sounds are normal. He exhibits no distension. There is no tenderness.  Musculoskeletal: He exhibits no edema. No tenderness. Neurological: Alertand oriented. ?Somewhat confused. Skin: Skin is warmand dry.  Psychiatric: He has a normal mood and affect. His behavior is normal. Judgmentand thought contentnormal.   Assessment/Plan: 1. Functional deficits secondary to debility which require 3+ hours per day of interdisciplinary therapy in a comprehensive  inpatient rehab setting. Physiatrist is providing close team supervision and 24 hour management of active medical problems listed below. Physiatrist and rehab team continue to assess barriers to discharge/monitor patient progress toward functional and medical goals.  Function:  Bathing Bathing position      Bathing parts      Bathing assist        Upper Body Dressing/Undressing Upper body dressing                    Upper body assist        Lower Body Dressing/Undressing Lower body dressing                                  Lower body assist        Toileting Toileting          Toileting assist     Transfers Chair/bed transfer             Locomotion Ambulation           Wheelchair          Cognition Comprehension Comprehension assist level: Follows basic conversation/direction with extra time/assistive device  Expression Expression assist level: Expresses basic needs/ideas: With extra time/assistive device  Social Interaction Social Interaction assist level: Interacts appropriately with others - No medications needed.  Problem Solving Problem solving  assist level: Solves basic problems with no assist  Memory Memory assist level: More than reasonable amount of time     Medical Problem List and Plan: 1. Deconditioning secondary to urosepsis and non-ST MI  Begin CIR 2. DVT Prophylaxis/Anticoagulation: Mechanical: Sequential compression devices, below knee Bilateral lower extremities 3. Pain Management: tylenol prn  4. Mood: LCSW to follow for evaluation and support.  5. Neuropsych: This patient iscapable of making decisions on hisown behalf. 6. Skin/Wound Care: Routine pressure relief measures 7. Fluids/Electrolytes/Nutrition: Monitor I/O. 8. Citrobacter Braakii/Pseudomonas aeruginosa bacteremia:  Cont cipro through 11/5                 9. NSTEMI: Due to demand ischemia.   Treated medically as without symptoms  Monitor with  increase in activity.  10. Persistent leucocytosis: Likely reactive, stable.   Afebrile  Will cont to monitor 11. Chronic Thrombocytopenia: Improving  Will monitor for signs of bleeding.  12. Diarrhea: Resolved  Discontinued Senna and miralax.  13. Low protein malnutrition: Continue ensure bid for supplement.  14. Abnormal LFTs: On high dose statin  Repeat LFTs WNL 15. Urethral stricture/Urinary retention: SPC to straight drain.  16. Acute on chronic renal failure: ?baseline Cr-1.44.   Cr. 0.93 on 10/27  LOS (Days) 1 A FACE TO FACE EVALUATION WAS PERFORMED  Keyondra Lagrand Lorie Phenix 01/22/2016 8:08 AM

## 2016-01-22 NOTE — Progress Notes (Signed)
Initial Nutrition Assessment  DOCUMENTATION CODES:   Not applicable  INTERVENTION:  Continue Ensure Enlive po BID, each supplement provides 350 kcal and 20 grams of protein.  Encourage adequate PO intake.   NUTRITION DIAGNOSIS:   Increased nutrient needs related to  (therapy) as evidenced by estimated needs.  GOAL:   Patient will meet greater than or equal to 90% of their needs  MONITOR:   PO intake, Supplement acceptance, Labs, Weight trends, Skin, I & O's  REASON FOR ASSESSMENT:   Malnutrition Screening Tool    ASSESSMENT:    82 y.o. male with history of Bell's palsy, GERD, CAD s/p CABG/ stents (LVEF 45%), HTN, prostate cancer, urethral stricture with chronic UTIs and SPC with recent treatment for UTI. He was admitted on 01/14/16 with altered mental status, abdominal pain and hypotension due to urosepsis. Ultrasound of abdomen done revealing continued mild prominence of pancreatic duct and question of mild intrahepatic biliary ductal dilatation.  Pt reports having a good appetite currently and PTA with usual consumption of at least 3 meals a day with snacks in between. Currently meal completion has been 75-100%. Question accuracy of most recent recorded weight. Pt currently has Ensure ordered and has been consuming them. RD to continue with current orders.   Nutrition-Focused physical exam completed. Findings are mild to moderate fat depletion, mild to moderate muscle depletion, and mild edema. Depletion likely related to the natural aging process.  Labs and medications reviewed.   Diet Order:  Diet Heart Room service appropriate? Yes; Fluid consistency: Thin  Skin:  Reviewed, no issues  Last BM:  10/26  Height:   Ht Readings from Last 1 Encounters:  01/21/16 5\' 5"  (1.651 m)    Weight:   Wt Readings from Last 1 Encounters:  01/21/16 148 lb 2.4 oz (67.2 kg)    Ideal Body Weight:  61.8 kg  BMI:  Body mass index is 24.65 kg/m.  Estimated Nutritional Needs:    Kcal:  1800-1900  Protein:  75-90 grams  Fluid:  1.8 -1 .9 L/day  EDUCATION NEEDS:   No education needs identified at this time  Corrin Parker, MS, RD, LDN Pager # [phone] After hours/ weekend pager # [phone]

## 2016-01-23 ENCOUNTER — Inpatient Hospital Stay (HOSPITAL_COMMUNITY): Payer: Medicare Other

## 2016-01-23 ENCOUNTER — Inpatient Hospital Stay (HOSPITAL_COMMUNITY): Payer: Medicare Other | Admitting: Physical Therapy

## 2016-01-23 LAB — CULTURE, BLOOD (ROUTINE X 2)
Culture: NO GROWTH
Culture: NO GROWTH

## 2016-01-23 LAB — CBC WITH DIFFERENTIAL/PLATELET
Basophils Absolute: 0 10*3/uL (ref 0.0–0.1)
Basophils Relative: 0 %
Eosinophils Absolute: 0.2 10*3/uL (ref 0.0–0.7)
Eosinophils Relative: 2 %
HCT: 40.3 % (ref 39.0–52.0)
Hemoglobin: 13.2 g/dL (ref 13.0–17.0)
Lymphocytes Relative: 13 %
Lymphs Abs: 1.5 10*3/uL (ref 0.7–4.0)
MCH: 30.1 pg (ref 26.0–34.0)
MCHC: 32.8 g/dL (ref 30.0–36.0)
MCV: 91.8 fL (ref 78.0–100.0)
Monocytes Absolute: 1.1 10*3/uL — ABNORMAL HIGH (ref 0.1–1.0)
Monocytes Relative: 9 %
Neutro Abs: 8.9 10*3/uL — ABNORMAL HIGH (ref 1.7–7.7)
Neutrophils Relative %: 75 %
Platelets: 167 10*3/uL (ref 150–400)
RBC: 4.39 MIL/uL (ref 4.22–5.81)
RDW: 14.4 % (ref 11.5–15.5)
WBC: 11.8 10*3/uL — ABNORMAL HIGH (ref 4.0–10.5)

## 2016-01-23 MED ORDER — GALANTAMINE HYDROBROMIDE ER 8 MG PO CP24
8.0000 mg | ORAL_CAPSULE | Freq: Every day | ORAL | Status: DC
  Administered 2016-01-24 – 2016-01-29 (×6): 8 mg via ORAL
  Filled 2016-01-23 (×6): qty 1

## 2016-01-23 NOTE — Progress Notes (Signed)
Occupational Therapy Session Note  Patient Details  Name: CLENT DAMORE MRN: [id number] Date of Birth: November 16, 1927  Today's Date: 01/23/2016 OT Individual Time: 0800-0900 OT Individual Time Calculation (min): 60 min   Short Term Goals: Week 1:  OT Short Term Goal 1 (Week 1): STGs=LTGs due to ELOS  Skilled Therapeutic Interventions/Progress Updates: ADL-retraining with focus on improved awareness, problem-solving, dynamic standing balance, and adapted bathing and dressing skills.   Pt received supine in bed and requesting assist with managing catheter bag and IV placement in prep for self-care.   Pt reported discomfort at right hand with IV placed and reports that he typically manages his leg bag independently at home.   OT contacted RN who provided replacement leg bag and removed IV access at right hand as requested.    Pt was then encouraged to bathe and groom at sink due to time limitation and he proceeded with min instructional cues to problem-solve, setup to expedite self-care, and steadying assist while standing for transfers from bed and at sink while donning underwear and pants.    During BADL, pt reported need to toilet but he was unable to maintain continence for BM while ambulating to bathroom.     Therapy Documentation Precautions:  Precautions Precautions: Fall Restrictions Weight Bearing Restrictions: No  Vital Signs: Therapy Vitals Temp: 98.3 F (36.8 C) Temp Source: Oral Pulse Rate: (!) 56 Resp: 18 BP: (!) 131/55 Patient Position (if appropriate): Lying Oxygen Therapy SpO2: 97 % O2 Device: Not Delivered   Pain: Pain Assessment Pain Assessment: No/denies pain   ADL: ADL ADL Comments: Please see functional navigator for ADL status   See Function Navigator for Current Functional Status.   Therapy/Group: Individual Therapy   Second session: Time: 1400-1500 Time Calculation (min):  60 min  Pain Assessment: No/denies pain  Skilled Therapeutic  Interventions: ADL-retraining at tub/shower with focus on improved safety awareness, energy conservation, transfers, and functional endurance.   Pt received seated in recliner with family present.   OT educated pt and family on discharge goals to include use of tub/shower combination prior to discharge.   Pt required setup and supervision with need for intermittent verbal cues to attend to position and posture while ambulating with RW.   Pt able to transfer in/out of tub/shower after one demonstration of safe technique.   Pt bathed thoroughly but required intermittent assist to manage hand shower and problem-solve.   Pt dressed seated at edge of tub bench but required steadying assist while donning right sock 2 times due to excessive right lean resulting in loss of dynamic sitting balance.  OT advised lowering seat height of bench or dressing seated on toilet at home with supervision for safety.    Pt then advanced to grooming seated at sink with min assist for thoroughness for shaving; pt shaved by feel d/t forgetting his glasses and rejecting OT offer of assist to recover glasses prior to shaving.  See FIM for current functional status  Therapy/Group: Individual Therapy  Trinette Vera 01/23/2016, 8:59 AM

## 2016-01-23 NOTE — Progress Notes (Signed)
[place] PHYSICAL MEDICINE & REHABILITATION     PROGRESS NOTE  Subjective/Complaints:  Mouth/lips uncomfortable. Viscous made his throat "swell".   ROS: Denies CP, SOB, N/V/D or pain anywhere else  Objective: Vital Signs: Blood pressure (!) 131/55, pulse (!) 56, temperature 98.3 F (36.8 C), temperature source Oral, resp. rate 18, height 5\' 5"  (1.651 m), weight 67.5 kg (148 lb 13 oz), SpO2 97 %. No results found.  Recent Labs  01/21/16 0404 01/22/16 0719  WBC 12.7* 11.8*  HGB 14.0 13.2  HCT 41.6 40.3  PLT 86* 167    Recent Labs  01/22/16 0719  NA 138  K 4.2  CL 107  GLUCOSE 112*  BUN 18  CREATININE 0.93  CALCIUM 8.6*   CBG (last 3)  No results for input(s): GLUCAP in the last 72 hours.  Wt Readings from Last 3 Encounters:  01/23/16 67.5 kg (148 lb 13 oz)  01/21/16 73.4 kg (161 lb 13.1 oz)  10/26/15 69.7 kg (153 lb 9.6 oz)    Physical Exam:  BP (!) 131/55 (BP Location: Right Arm)   Pulse (!) 56   Temp 98.3 F (36.8 C) (Oral)   Resp 18   Ht 5\' 5"  (1.651 m)   Wt 67.5 kg (148 lb 13 oz)   SpO2 97%   BMI 24.76 kg/m  Constitutional: He appears well-developedand well-nourished. NAD. HENT: Normocephalicand atraumatic.  Mouth/Throat: Poor dentition. Sore underneath lower lip on midline  Eyes: Conjunctivaeand EOM are normal.  Cardiovascular: RRR. No JVD. Murmurheard. Respiratory: Effort normaland breath sounds normal. No stridor.  GI: Soft. Bowel sounds are normal. He exhibits no distension. There is no tenderness.  Musculoskeletal: He exhibits no edema. No tenderness. Neurological: Alertand oriented. ?mild confusion, HOH Skin: Skin is warmand dry.  Psychiatric: He has a normal mood and affect. His behavior is normal. Judgmentand thought contentnormal.   Assessment/Plan: 1. Functional deficits secondary to debility which require 3+ hours per day of interdisciplinary therapy in a comprehensive inpatient rehab setting. Physiatrist is providing close  team supervision and 24 hour management of active medical problems listed below. Physiatrist and rehab team continue to assess barriers to discharge/monitor patient progress toward functional and medical goals.  Function:  Bathing Bathing position   Position: Shower  Bathing parts Body parts bathed by patient: Right arm, Left arm, Abdomen, Chest, Front perineal area, Buttocks, Right upper leg, Left upper leg Body parts bathed by helper: Right lower leg, Left lower leg, Back  Bathing assist Assist Level: Touching or steadying assistance(Pt > 75%)      Upper Body Dressing/Undressing Upper body dressing   What is the patient wearing?: Hospital gown                Upper body assist Assist Level: Touching or steadying assistance(Pt > 75%)      Lower Body Dressing/Undressing Lower body dressing   What is the patient wearing?: Hospital Gown, Non-skid slipper socks           Non-skid slipper socks- Performed by helper: Don/doff right sock, Don/doff left sock                  Lower body assist Assist for lower body dressing: Touching or steadying assistance (Pt > 75%)      Toileting Toileting   Toileting steps completed by patient: Performs perineal hygiene Toileting steps completed by helper: Performs perineal hygiene, Adjust clothing after toileting Toileting Assistive Devices: Grab bar or rail  Toileting assist Assist level: Touching or steadying  assistance (Pt.75%)   Transfers Chair/bed transfer   Chair/bed transfer method: Stand pivot Chair/bed transfer assist level: Supervision or verbal cues Chair/bed transfer assistive device: Armrests, Medical sales representative     Max distance: 121ft  Assist level: Touching or steadying assistance (Pt > 75%)   Wheelchair   Type: Manual Max wheelchair distance: 160ft  Assist Level: Supervision or verbal cues  Cognition Comprehension Comprehension assist level: Follows complex conversation/direction with  extra time/assistive device  Expression Expression assist level: Expresses complex ideas: With extra time/assistive device  Social Interaction Social Interaction assist level: Interacts appropriately with others - No medications needed.  Problem Solving Problem solving assist level: Solves basic problems with no assist  Memory Memory assist level: More than reasonable amount of time     Medical Problem List and Plan: 1. Deconditioning secondary to urosepsis and non-ST MI  Continue CIR therapies 2. DVT Prophylaxis/Anticoagulation: Mechanical: Sequential compression devices, below knee Bilateral lower extremities 3. Pain Management: tylenol prn  4. Mood: LCSW to follow for evaluation and support.  5. Neuropsych: This patient iscapable of making decisions on hisown behalf. 6. Skin/Wound Care: Routine pressure relief measures 7. Fluids/Electrolytes/Nutrition: push PO 8. Citrobacter Braakii/Pseudomonas aeruginosa bacteremia:  Cont cipro through 11/5                 9. NSTEMI: Due to demand ischemia.   Treated medically as without symptoms  Monitor with increased activity in therapy 10. Persistent leucocytosis: Likely reactive, stable.   Afebrile  Will cont to monitor serially 11. Chronic Thrombocytopenia: Improving  Will monitor for signs of bleeding.  12. Diarrhea: Resolved  off Senna and miralax.  13. Low protein malnutrition: Continue ensure bid for supplement.  14. Abnormal LFTs: On high dose statin  Repeat LFTs WNL 15. Urethral stricture/Urinary retention: SPC continues to straight drain.  16. Acute on chronic renal failure: ?baseline Cr-1.44.   Cr. 0.93 on 10/27--recheck next week  LOS (Days) 2 A FACE TO FACE EVALUATION WAS PERFORMED  Jaymes Revels T 01/23/2016 8:32 AM

## 2016-01-23 NOTE — Progress Notes (Signed)
Physical Therapy Session Note  Patient Details  Name: Ronald [name] MRN: [id number] Date of Birth: 22-Oct-1927  Today's Date: 01/23/2016 PT Individual Time: 0920-1036 PT Individual Time Calculation (min): 76 min    Short Term Goals: Week 1:  PT Short Term Goal 1 (Week 1): = LTG due to short LOS  Skilled Therapeutic Interventions/Progress Updates:   Pt received in w/c after OT session, no reports of pain but reports weakness.  Performed gait x 150' with RW and min A with pt demonstrating shuffling gait and trunk flexion with min-mod cues for upright trunk, gaze, to maintain safe distance to RW and for full step length and foot clearance.  Performed LE strengthening with seated alternating hip flexion/marches, lateral stepping to L and R while pushing against BOSU ball x 25' x 2 each direction for glute med strengthening, weight shifting and balance. Performed stair negotiation training for home entry/exit with pt performing up/down 4 stairs x 2 reps with R rail and min-mod A with alternating sequence to ascend, step to sequence to descend.  Performed gait training and foot clearance training stepping forwards, backwards and R/L laterally over walking sticks x 10 reps each direction with UE support on cane and min-mod A for balance.  Performed LE strengthening and ankle/hip strategy training with sit <> stand without UE support x 12 reps with supervision-min A.  Returned to room with RW and min A with cues for upright gaze, posture and foot clearance.  Pt's wife present at end of session.  Discussed with wife that pt is expressing interest in D/C early next week (Tuesday) and discussed that therapy felt pt would need more time to reach goals.  Pt's wife agrees that pt will need more time to be ready for D/C home.  Will continue to discuss.  Pt left in w/c with all items within reach.  Therapy Documentation Precautions:  Precautions Precautions: Fall Restrictions Weight Bearing Restrictions:  No Pain: Pain Assessment Pain Assessment: No/denies pain  See Function Navigator for Current Functional Status.   Therapy/Group: Individual Therapy  Raylene Everts Faucette 01/23/2016, 10:40 AM

## 2016-01-24 ENCOUNTER — Inpatient Hospital Stay (HOSPITAL_COMMUNITY): Payer: Medicare Other | Admitting: Occupational Therapy

## 2016-01-24 DIAGNOSIS — R220 Localized swelling, mass and lump, head: Secondary | ICD-10-CM

## 2016-01-24 NOTE — Progress Notes (Signed)
[facility] PHYSICAL MEDICINE & REHABILITATION     PROGRESS NOTE  Subjective/Complaints:  Mouth/lips still swelling after the "simethicone"---after investigating, he wasn't sure if it wasn't the MMW w/lidocaine  ROS: Denies CP, SOB, N/V/D, pain anywhere else  Objective: Vital Signs: Blood pressure (!) 125/56, pulse 63, temperature 98.4 F (36.9 C), temperature source Oral, resp. rate 18, height 5\' 5"  (1.651 m), weight 67 kg (147 lb 11.3 oz), SpO2 97 %. No results found.  Recent Labs  01/22/16 0719  WBC 11.8*  HGB 13.2  HCT 40.3  PLT 167    Recent Labs  01/22/16 0719  NA 138  K 4.2  CL 107  GLUCOSE 112*  BUN 18  CREATININE 0.93  CALCIUM 8.6*   CBG (last 3)  No results for input(s): GLUCAP in the last 72 hours.  Wt Readings from Last 3 Encounters:  01/24/16 67 kg (147 lb 11.3 oz)  01/21/16 73.4 kg (161 lb 13.1 oz)  10/26/15 69.7 kg (153 lb 9.6 oz)    Physical Exam:  BP (!) 125/56 (BP Location: Right Arm)   Pulse 63   Temp 98.4 F (36.9 C) (Oral)   Resp 18   Ht 5\' 5"  (1.651 m)   Wt 67 kg (147 lb 11.3 oz)   SpO2 97%   BMI 24.58 kg/m  Constitutional: He appears well-developedand well-nourished. NAD. HENT: Normocephalicand atraumatic.  Mouth/Throat: Poor dentition. Sore underneath lower lip midline. No visible swelling Eyes: Conjunctivaeand EOM are normal.  Cardiovascular: RRR. No JVD. Murmurheard Respiratory: Effort normaland breath sounds normal. No stridor.  GI: Soft. Bowel sounds are normal. He exhibits no distension. There is no tenderness.  Musculoskeletal: He exhibits no edema. No tenderness. Neurological: Alertand oriented.  HOH Skin: Skin is warmand dry.  Psychiatric: He has a normal mood and affect. His behavior is normal. Judgmentand thought contentnormal.   Assessment/Plan: 1. Functional deficits secondary to debility which require 3+ hours per day of interdisciplinary therapy in a comprehensive inpatient rehab setting. Physiatrist is  providing close team supervision and 24 hour management of active medical problems listed below. Physiatrist and rehab team continue to assess barriers to discharge/monitor patient progress toward functional and medical goals.  Function:  Bathing Bathing position   Position: Wheelchair/chair at sink  Bathing parts Body parts bathed by patient: Right arm, Left arm, Chest, Abdomen, Front perineal area, Buttocks, Right upper leg, Left upper leg Body parts bathed by helper: Right lower leg, Left lower leg, Back  Bathing assist Assist Level: Touching or steadying assistance(Pt > 75%)      Upper Body Dressing/Undressing Upper body dressing   What is the patient wearing?: Pull over shirt/dress     Pull over shirt/dress - Perfomed by patient: Thread/unthread right sleeve, Thread/unthread left sleeve, Put head through opening, Pull shirt over trunk          Upper body assist Assist Level: Touching or steadying assistance(Pt > 75%)      Lower Body Dressing/Undressing Lower body dressing   What is the patient wearing?: Underwear, Pants, Non-skid slipper socks Underwear - Performed by patient: Thread/unthread right underwear leg, Thread/unthread left underwear leg, Pull underwear up/down   Pants- Performed by patient: Thread/unthread right pants leg, Thread/unthread left pants leg, Pull pants up/down, Fasten/unfasten pants   Non-skid slipper socks- Performed by patient: Don/doff right sock, Don/doff left sock Non-skid slipper socks- Performed by helper: Don/doff right sock, Don/doff left sock  Lower body assist Assist for lower body dressing: Touching or steadying assistance (Pt > 75%)      Toileting Toileting Toileting activity did not occur: No continent bowel/bladder event Toileting steps completed by patient: Adjust clothing prior to toileting, Performs perineal hygiene, Adjust clothing after toileting Toileting steps completed by helper: Performs perineal  hygiene, Adjust clothing after toileting Toileting Assistive Devices: Grab bar or rail  Toileting assist Assist level: Touching or steadying assistance (Pt.75%)   Transfers Chair/bed transfer   Chair/bed transfer method: Stand pivot Chair/bed transfer assist level: Supervision or verbal cues Chair/bed transfer assistive device: Armrests, Medical sales representative     Max distance: 141ft  Assist level: Touching or steadying assistance (Pt > 75%)   Wheelchair   Type: Manual Max wheelchair distance: 176ft  Assist Level: Supervision or verbal cues  Cognition Comprehension Comprehension assist level: Follows basic conversation/direction with no assist  Expression Expression assist level: Expresses complex ideas: With no assist  Social Interaction Social Interaction assist level: Interacts appropriately with others - No medications needed.  Problem Solving Problem solving assist level: Solves complex 90% of the time/cues < 10% of the time  Memory Memory assist level: More than reasonable amount of time     Medical Problem List and Plan: 1. Deconditioning secondary to urosepsis and non-ST MI  Continue CIR therapies 2. DVT Prophylaxis/Anticoagulation: Mechanical: Sequential compression devices, below knee Bilateral lower extremities 3. Pain Management: tylenol prn  4. Mood: LCSW to follow for evaluation and support.  5. Neuropsych: This patient iscapable of making decisions on hisown behalf. 6. Skin/Wound Care: Routine pressure relief measures 7. Fluids/Electrolytes/Nutrition: push PO 8. Citrobacter Braakii/Pseudomonas aeruginosa bacteremia:  Cont cipro through 11/5                 9. NSTEMI: Due to demand ischemia.   Treated medically as without symptoms  Monitor with increased activity in therapy 10. Persistent leucocytosis: Likely reactive, stable.   Afebrile  Will cont to monitor serially 11. Chronic Thrombocytopenia: Improving  Will monitor for signs of  bleeding.  12. Diarrhea: Resolved  off Senna and miralax.  13. Low protein malnutrition: Continue ensure bid for supplement.  14. Abnormal LFTs: On high dose statin  Repeat LFTs WNL 15. Urethral stricture/Urinary retention: SPC continues to straight drain.  16. Acute on chronic renal failure: ?baseline Cr-1.44.   Cr. 0.93 on 10/27--recheck next week 17. Oral swelling- ?reaction to lidocaine. I had stopped viscous lidocaine yesterday, didn't realize that he also has MMW w/ lidocaine---stopped this. If persists with simethicone, will dc this as well.   LOS (Days) 3 A FACE TO FACE EVALUATION WAS PERFORMED  Deeana Atwater T 01/24/2016 8:28 AM

## 2016-01-24 NOTE — Progress Notes (Signed)
Social Work  Social Work Assessment and Plan  Patient Details  Name: Ronald [name] MRN: [id number] Date of Birth: 10-30-1927  Today's Date: 01/22/2016  Problem List:  Patient Active Problem List   Diagnosis Date Noted  . Stage 2 chronic kidney disease   . Bacteremia due to Pseudomonas 01/21/2016  . Debility 01/21/2016  . History of Bell's palsy   . Gastroesophageal reflux disease without esophagitis   . Coronary artery disease involving coronary bypass graft of native heart without angina pectoris   . History of prostate cancer   . Suprapubic catheter ([place])   . Thrombocytopenia ([place])   . Hypoalbuminemia due to protein-calorie malnutrition ([place])   . Lymphocytosis   . Incontinence of feces   . Renal failure syndrome   . Acute respiratory failure with hypoxia ([place])   . Bacteremia   . Abdominal pain   . NSTEMI (non-ST elevated myocardial infarction) ([place]) 01/16/2016  . Altered mental state 01/14/2016  . Septic shock ([place])   . Generalized weakness 10/13/2015  . ARF (acute renal failure) ([place]) 10/13/2015  . Sepsis ([place]) 12/05/2014  . Urinary tract infection associated with cystostomy catheter ([place]) 10/03/2014  . Acute lower UTI 09/10/2014  . Fever 09/10/2014  . Urinary frequency 06/30/2014  . Bacteremia due to Enterococcus 01/22/2014  . SIRS (systemic inflammatory response syndrome) ([place]) November 23, 202015  . Chronic systolic CHF (congestive heart failure) ([place]) November 23, 202015  . Urethral stricture 12/25/2013  . Bladder calculus 12/25/2013  . LV dysfunction 09/23/2013  . Chest pain 08/27/2013  . Unstable angina ([place]) 08/27/2013  . Bacteremia due to Escherichia coli 05/20/2012  . Urinary tract infection 09/15/2011  . Calculus of kidney 09/12/2011  . Bradycardia 10/26/2010  . CHEST WALL PAIN, ACUTE 09/04/2009  . CHILLS WITHOUT FEVER 02/10/2009  . HYPERCHOLESTEROLEMIA 10/18/2007  . HTN (hypertension) 10/18/2007  . CELLULITIS, FOOT, LEFT 02/27/2007  . OSTEOARTHRITIS 02/07/2007  .  ABDOMINAL PAIN 01/01/2007  . BELL'S PALSY, RIGHT 10/26/2006  . Coronary atherosclerosis 10/26/2006  . PROSTATE CANCER, HX OF 10/26/2006  . NEPHROLITHIASIS, HX OF 10/26/2006   Past Medical History:  Past Medical History:  Diagnosis Date  . Arthritis    "joints ache" (01/02/2014)  . At risk for sleep apnea    STOP-BANG= 4    SENT TO PCP 12-23-2013  . Bladder calculi   . CHF (congestive heart failure) ([place])   . Chronic cystitis   . Coronary artery disease CARDIOLOGIST-  DR Grace Bushy  MI -- S/P  11/89 CABG x6 (70% circ; 90% PD; 60-70% distal left main; 30% left circ; 90% 1st diag;, 2nd diag and 3rd diag. w/90%,  mild stenosis LAD and 60-70% pLAD)  Re-do CABG x5 in 1997  . Diverticulosis   . GERD (gastroesophageal reflux disease)   . History of Bell's palsy    RIGHT SIDE-- NO RESIDUAL  . Hx of dizziness   . Hyperlipidemia   . Hypertension    "not anymore" (01/02/2014)  . Ischemic cardiomyopathy    ef 35-40% per cath 08-28-2013  . Kidney stones "years ago"   "passed them"  . Melanoma of ear ([place])    "right"  . Mild dementia   . Myocardial infarction 1986; 1997  . Nocturia   . OAB (overactive bladder)   . Prostate cancer ([place]) 1998   S/P  [place]; [place]  . S/P CABG (coronary artery bypass graft)    [place]  . Skipped heart  beats    occasional  . Urethral stricture   . UTI (urinary tract infection) 09/2015  . Wears hearing aid    bilateral  . Wears partial dentures    Past Surgical History:  Past Surgical History:  Procedure Laterality Date  . CARDIAC CATHETERIZATION  02-22-2005  dr Vidal Schwalbe   mild to moderate lv dysfunction with inferobasilar akinesis/  totally occluded SVG to Intermediate Diagonal and SVG to PDA and RCA branches, totally occluded native coronary circulation with diffuse disease pLAD diagonal system with potentially could be ischemic/  patent SVG to OM with collaterals to dRCA and patent LIMA to LAD and  diagonal systemss  . CARDIAC CATHETERIZATION  08-28-2013  DR Daneen Schick   widely patent sequential left internal graft to the diagonal/LAD, widely patent SVG to OM with proximal 50% narrowing noted in the graft, total occlusion SVG's to RCA, RI, and the Diagonal/ LV dysfunction with inferobasal aneurysm and mid anterior wall region of akinesis/  overall EF 35-40%/  Total occlusion of the navtive circulation/  no significant change compared to 2006 cath  . CARDIOVASCULAR STRESS TEST  08-01-2011  dr Angelena Form   inferior scar and possible soft tissue attenuation with minimal peri-infarct ischemia, small region of anterior ischemia and scar/  LVEF 53% LV wall motion with inferior hypokinesis/ no significant change from scan july 2011  . CATARACT EXTRACTION W/ INTRAOCULAR LENS  IMPLANT, BILATERAL Bilateral 2007  . CORONARY ARTERY BYPASS GRAFT  11/ [place]-- 6 vessel/  1997 Re-do 5 vessel  . CYSTOSCOPY WITH URETHRAL DILATATION N/A 12/25/2013   Procedure: CYSTOSCOPY WITH URETHRAL DILATATION, WITH BIOPSY;  Surgeon: Bernestine Amass, MD;  [facility];  Service: Urology;  Laterality: N/A;  . CYSTOSCOPY WITH URETHRAL DILATATION N/A 12/22/2014   Procedure: CYSTOSCOPY WITH URETHRAL DILATATION;  Surgeon: Rana Snare, MD;  Location: [facility];  Service: Urology;  Laterality: N/A;  BALLOON DILATION CATHETER    . EUS  10/05/2011   Procedure: ESOPHAGEAL ENDOSCOPIC ULTRASOUND (EUS) RADIAL;  Surgeon: Arta Silence, MD;  Location: [facility];  Service: Endoscopy;  Laterality: N/A;  . INSERTION OF SUPRAPUBIC CATHETER N/A 12/22/2014   Procedure: INSERTION OF SUPRAPUBIC CATHETER ;  Surgeon: Rana Snare, MD;  Location: [facility];  Service: Urology;  Laterality: N/A;  . LAPAROSCOPIC CHOLECYSTECTOMY  12-23-2005  . LEFT HEART CATHETERIZATION WITH CORONARY/GRAFT ANGIOGRAM N/A 08/28/2013   Procedure: LEFT HEART CATHETERIZATION WITH Beatrix Fetters;  Surgeon: Sinclair Grooms, MD;   [facility];  Service: Cardiovascular;  Laterality: N/A;  . MELANOMA EXCISION  X 1   "ear"  . RADIOACTIVE PROSTATE SEED IMPLANTS  1998  . SKIN CANCER EXCISION  X 2   "top of head"  . TONSILLECTOMY AND ADENOIDECTOMY  1954   Social History:  reports that he quit smoking about 32 years ago. His smoking use included Cigars. He quit after 40.00 years of use. He quit smokeless tobacco use about 2 years ago. His smokeless tobacco use included Chew. He reports that he does not drink alcohol or use drugs.  Family / Support Systems Marital Status: Married Patient Roles: Spouse, Parent Spouse/Significant Other: wife, Khan Chura @ [phone] Children: son, Rollo Farquhar @ [phone] and daughter, Santiago Glad @ [phone] Other Supports: (P) two nephews that live very close to pt/wife. Anticipated Caregiver: wife and family Ability/Limitations of Caregiver: can provide supervision level Caregiver Availability: 24/7 Family Dynamics: Wife very attentive and encouraging during interview.  Reports family as very supportive.  Social History Preferred language: English Religion: Presbyterian Cultural Background: NA Education: college Read: Yes Write: Yes Employment Status: Retired Date Retired/Disabled/Unemployed: 1992 from [employer] Issues: None Guardian/Conservator: None - per MD, pt is capable of making decisions on his own behalf.   Abuse/Neglect Physical Abuse: Denies Verbal Abuse: Denies Sexual Abuse: Denies Exploitation of patient/patient's resources: Denies Self-Neglect: Denies  Emotional Status Pt's affect, behavior adn adjustment status: Pt showing a lot of humor at start of the assessment interview, however, later in he becomes tearful as he speaks of his multiple hospitalizations due to "infections they can figure out how to treat."  He describes himself a "tired.Marland KitchenMarland KitchenI just don't know how much I've got in me to keep doing this."   Wife quickly offers encouragement and states, "The man upstairs is the only one who knows what will happen.  You want to be around for those grandchildren."  Will monitor emotional adjustment but may benefit from neuropsychology consult. Recent Psychosocial Issues: mutliple hospitalizations avg q 3 months Pyschiatric History: None Substance Abuse History: None  Patient / Family Perceptions, Expectations & Goals Pt/Family understanding of illness & functional limitations: Pt and wife with basic understanding about the recurrent infections and his current state of debility/ need for CIR. Premorbid pt/family roles/activities: Wife was assisting as needed PTA.  Per wife, pt would rebuild his strength after prior hospitalizations and regain his independence. Anticipated changes in roles/activities/participation: Little change aniticipated as wife has been his primary caregiver. Pt/family expectations/goals: "I just want to go home."  [employer]: None Premorbid Home Care/DME Agencies: Other (Comment) [facility]) Transportation available at discharge: yes Resource referrals recommended: Neuropsychology  Discharge Planning Living Arrangements: Spouse/significant other Support Systems: Spouse/significant other, Children Type of Residence: Private residence Insurance Resources: [employer], Multimedia programmer (specify) Nurse, mental health) Financial Resources: [place] Referred: No Living Expenses: Own Money Management: Spouse Does the patient have any problems obtaining your medications?: No Home Management: wife primarily Patient/Family Preliminary Plans: Pt to return home with wife as primary caregiver Social Work Anticipated Follow Up Needs: HH/OP Expected length of stay: 5-7 days  Clinical Impression Elderly gentleman here following urosepsis episode and very weakened.  Shows some humor but turns to tearfulness as he reports he is frustrated with multiple  hospitalizations and feels as "it just never ends..."  Wife very supportive and encouraging.  Anticipating a shore LOS and home with wife.  Will monitor mood and refer for neuropsychology as indicated.  Demarri Elie 01/22/2016, 3:26 PM

## 2016-01-24 NOTE — Progress Notes (Signed)
Occupational Therapy Session Note  Patient Details  Name: Ronald [name] MRN: [id number] Date of Birth: 12/04/1927  Today's Date: 01/24/2016 OT Individual Time: 5750-5183 OT Individual Time Calculation (min): 73 min   Short Term Goals: Week 1:  OT Short Term Goal 1 (Week 1): STGs=LTGs due to ELOS  Skilled Therapeutic Interventions/Progress Updates:   Pt participated in skilled OT session focusing on ADL retraining and caregiver education. Pt was agreeable to shower at time of arrival. Bathing/Dressing/functional toilet transfer completed with steady assist and instruction for safe hand placement. Cues for forward gaze and upright posture also provided during transfers. Caregiver training provided for spouse Ronald [name] (for toilet transfers) with safety plan updated; Ronald [name] was also provided education on tub bench transfers in tub room. Extensive collaboration completed for problem solving shower bench vs. Tub at time of discharge. Pt is adamant about using his shower chair instead of the bench despite education on increased safety with using bench. Pt demonstrated safe transfer to shower chair when placed in tub shower using back up method with RW and steady assist. Pt and wife prefer using their home shower chair at this time. Pt ambulated back to room with RW and transferred back to bed with all needs within reach and bed alarm activated. Ronald [name] was also present at time of departure. No c/o pain.   Therapy Documentation Precautions:  Precautions Precautions: Fall Restrictions Weight Bearing Restrictions: No   ADL: ADL ADL Comments: Please see functional navigator for ADL status    See Function Navigator for Current Functional Status.   Therapy/Group: Individual Therapy  [name] 01/24/2016, 12:44 PM

## 2016-01-24 NOTE — Care Management Note (Signed)
Inpatient [facility] Individual Statement of Services  Patient Name:  Ronald [name]  Date:  01/22/2016  Welcome to the [facility].  Our goal is to provide you with an individualized program based on your diagnosis and situation, designed to meet your specific needs.  With this comprehensive rehabilitation program, you will be expected to participate in at least 3 hours of rehabilitation therapies Monday-Friday, with modified therapy programming on the weekends.  Your rehabilitation program will include the following services:  Physical Therapy (PT), Occupational Therapy (OT), 24 hour per day rehabilitation nursing, Therapeutic Recreaction (TR), Neuropsychology, Case Management (Social Worker), Rehabilitation Medicine, Nutrition Services and Pharmacy Services  Weekly team conferences will be held on Wednesdays to discuss your progress.  Your Social Worker will talk with you frequently to get your input and to update you on team discussions.  Team conferences with you and your family in attendance may also be held.  Expected length of stay: 5-7 days  Overall anticipated outcome: modified independent  Depending on your progress and recovery, your program may change. Your Social Worker will coordinate services and will keep you informed of any changes. Your Social Worker's name and contact numbers are listed  below.  The following services may also be recommended but are not provided by the [facility] will be made to provide these services after discharge if needed.  Arrangements include referral to agencies that provide these services.  Your insurance has been verified to be:  Medicare and [employer] Your primary doctor is:  Burnice Logan  Pertinent information will be shared with your doctor and your insurance company.  Social  Worker:  [place], [place] or [phone]   Information discussed with and copy given to patient by: Lennart Pall, 01/22/2016, 3:38 PM

## 2016-01-25 ENCOUNTER — Inpatient Hospital Stay (HOSPITAL_COMMUNITY): Payer: Medicare Other | Admitting: Occupational Therapy

## 2016-01-25 ENCOUNTER — Inpatient Hospital Stay (HOSPITAL_COMMUNITY): Payer: Medicare Other | Admitting: Physical Therapy

## 2016-01-25 DIAGNOSIS — B370 Candidal stomatitis: Secondary | ICD-10-CM

## 2016-01-25 DIAGNOSIS — E8809 Other disorders of plasma-protein metabolism, not elsewhere classified: Secondary | ICD-10-CM

## 2016-01-25 DIAGNOSIS — R001 Bradycardia, unspecified: Secondary | ICD-10-CM

## 2016-01-25 DIAGNOSIS — K1379 Other lesions of oral mucosa: Secondary | ICD-10-CM

## 2016-01-25 MED ORDER — ACETAMINOPHEN 325 MG PO TABS
650.0000 mg | ORAL_TABLET | Freq: Three times a day (TID) | ORAL | Status: DC
  Administered 2016-01-25 – 2016-01-29 (×13): 650 mg via ORAL
  Filled 2016-01-25 (×13): qty 2

## 2016-01-25 MED ORDER — FLUCONAZOLE 40 MG/ML PO SUSR
200.0000 mg | Freq: Every day | ORAL | Status: DC
  Administered 2016-01-25 – 2016-01-27 (×3): 200 mg via ORAL
  Filled 2016-01-25 (×4): qty 5

## 2016-01-25 MED ORDER — CHLORHEXIDINE GLUCONATE 0.12 % MT SOLN
15.0000 mL | Freq: Four times a day (QID) | OROMUCOSAL | Status: DC
  Administered 2016-01-25 – 2016-01-29 (×16): 15 mL via OROMUCOSAL
  Filled 2016-01-25 (×16): qty 15

## 2016-01-25 MED ORDER — GLUCERNA SHAKE PO LIQD
237.0000 mL | Freq: Three times a day (TID) | ORAL | Status: DC
  Administered 2016-01-25 – 2016-01-28 (×5): 237 mL via ORAL

## 2016-01-25 NOTE — Progress Notes (Signed)
[facility] PHYSICAL MEDICINE & REHABILITATION     PROGRESS NOTE  Subjective/Complaints:  Pt sitting up, working with therapies this AM.  Per nursing, pt states he is sensitive to glucose and felt his heart racing after ensure.  Pt states he had some expresso.  Please see note from PA as well.  ROS: Denies CP, SOB, N/V/D  Objective: Vital Signs: Blood pressure (!) 107/57, pulse (!) 54, temperature 97.9 F (36.6 C), temperature source Oral, resp. rate 18, height 5\' 5"  (1.651 m), weight 64.7 kg (142 lb 10.2 oz), SpO2 98 %. No results found. No results for input(s): WBC, HGB, HCT, PLT in the last 72 hours. No results for input(s): NA, K, CL, GLUCOSE, BUN, CREATININE, CALCIUM in the last 72 hours.  Invalid input(s): CO CBG (last 3)  No results for input(s): GLUCAP in the last 72 hours.  Wt Readings from Last 3 Encounters:  01/25/16 64.7 kg (142 lb 10.2 oz)  01/21/16 73.4 kg (161 lb 13.1 oz)  10/26/15 69.7 kg (153 lb 9.6 oz)    Physical Exam:  BP (!) 107/57 (BP Location: Right Arm)   Pulse (!) 54   Temp 97.9 F (36.6 C) (Oral)   Resp 18   Ht 5\' 5"  (1.651 m)   Wt 64.7 kg (142 lb 10.2 oz)   SpO2 98%   BMI 23.74 kg/m  Constitutional: He appears well-developedand well-nourished. NAD. HENT: Normocephalicand atraumatic.  Mouth/Throat: Poor dentition.  Eyes: Conjunctivaeand EOM are normal.  Cardiovascular: RRR. No JVD. Murmurheard Respiratory: Effort normaland breath sounds normal. No stridor.  GI: Soft. Bowel sounds are normal. He exhibits no distension. There is no tenderness.  Musculoskeletal: He exhibits no edema. No tenderness. Neurological: Alertand oriented.  HOH Motor: Grossly 4/5 throughout Skin: Skin is warmand dry.  Psychiatric: He has a normal mood and affect. His behavior is normal. Judgmentand thought contentnormal.   Assessment/Plan: 1. Functional deficits secondary to debility which require 3+ hours per day of interdisciplinary therapy in a comprehensive  inpatient rehab setting. Physiatrist is providing close team supervision and 24 hour management of active medical problems listed below. Physiatrist and rehab team continue to assess barriers to discharge/monitor patient progress toward functional and medical goals.  Function:  Bathing Bathing position   Position: Shower  Bathing parts Body parts bathed by patient: Right arm, Left arm, Chest, Abdomen, Front perineal area, Buttocks, Right upper leg, Left upper leg, Right lower leg, Left lower leg, Back Body parts bathed by helper: Right lower leg, Left lower leg, Back  Bathing assist Assist Level: Touching or steadying assistance(Pt > 75%)      Upper Body Dressing/Undressing Upper body dressing   What is the patient wearing?: Pull over shirt/dress     Pull over shirt/dress - Perfomed by patient: Thread/unthread right sleeve, Thread/unthread left sleeve, Put head through opening, Pull shirt over trunk          Upper body assist Assist Level: Supervision or verbal cues      Lower Body Dressing/Undressing Lower body dressing   What is the patient wearing?: Underwear, Pants, Non-skid slipper socks Underwear - Performed by patient: Thread/unthread right underwear leg, Thread/unthread left underwear leg, Pull underwear up/down   Pants- Performed by patient: Thread/unthread right pants leg, Thread/unthread left pants leg, Pull pants up/down, Fasten/unfasten pants   Non-skid slipper socks- Performed by patient: Don/doff right sock, Don/doff left sock Non-skid slipper socks- Performed by helper: Don/doff right sock, Don/doff left sock  Lower body assist Assist for lower body dressing: Touching or steadying assistance (Pt > 75%)      Toileting Toileting Toileting activity did not occur: No continent bowel/bladder event Toileting steps completed by patient: Adjust clothing prior to toileting, Performs perineal hygiene, Adjust clothing after toileting Toileting  steps completed by helper: Performs perineal hygiene, Adjust clothing after toileting Toileting Assistive Devices: Grab bar or rail  Toileting assist Assist level: Touching or steadying assistance (Pt.75%)   Transfers Chair/bed transfer   Chair/bed transfer method: Stand pivot Chair/bed transfer assist level: Touching or steadying assistance (Pt > 75%) Chair/bed transfer assistive device: Armrests, Medical sales representative     Max distance: 156ft  Assist level: Touching or steadying assistance (Pt > 75%)   Wheelchair   Type: Manual Max wheelchair distance: 113ft  Assist Level: Supervision or verbal cues  Cognition Comprehension Comprehension assist level: Follows basic conversation/direction with no assist  Expression Expression assist level: Expresses complex ideas: With no assist  Social Interaction Social Interaction assist level: Interacts appropriately with others - No medications needed.  Problem Solving Problem solving assist level: Solves complex 90% of the time/cues < 10% of the time  Memory Memory assist level: More than reasonable amount of time     Medical Problem List and Plan: 1. Deconditioning secondary to urosepsis and non-ST MI  Continue CIR  2. DVT Prophylaxis/Anticoagulation: Mechanical: Sequential compression devices, below knee Bilateral lower extremities 3. Pain Management: tylenol prn  4. Mood: LCSW to follow for evaluation and support.  5. Neuropsych: This patient iscapable of making decisions on hisown behalf. 6. Skin/Wound Care: Routine pressure relief measures 7. Fluids/Electrolytes/Nutrition: push PO 8. Citrobacter Braakii/Pseudomonas aeruginosa bacteremia:  Cont cipro through 11/5                 9. NSTEMI: Due to demand ischemia.   Treated medically as without symptoms  Monitor with increased activity in therapy 10. Persistent leucocytosis: Likely reactive, stable.   Afebrile  Will cont to monitor serially 11. Chronic  Thrombocytopenia: Improving  Will monitor for signs of bleeding.  12. Diarrhea: Resolved  D/ced Senna and miralax.  13. Low protein malnutrition: Ensure changed to Glucerna for supplement 10/30.  14. Abnormal LFTs: On high dose statin  Repeat LFTs WNL 15. Urethral stricture/Urinary retention: SPC continues to straight drain.  16. Acute on chronic renal failure: ?baseline Cr-1.44.   Cr. 0.93 on 10/27 17. Mouth discomfort  Diflucan started 10/30 for thrush 18. Bradycardia  Asymptomatic at present   Cont to monitor  LOS (Days) 4 A FACE TO FACE EVALUATION WAS PERFORMED  Deloise Marchant Lorie Phenix 01/25/2016 9:06 AM

## 2016-01-25 NOTE — Progress Notes (Signed)
Patient complaining of nausea with Ensure and expressing frustration about "my mouth". He reports that this was happening even before he came to the hospital and pain involves mouth, teeth and jaws. No throat pain and he felt that this was related to his stomach. He has been on multiple antibiotics in the past few months. Oral mucosa without white coating but irritated appearing. Will treat empirically for thrush. Also advised patient to try liquid v/s dysphagia 2 diet--may also be related to poor dentition.

## 2016-01-25 NOTE — Progress Notes (Addendum)
Nutrition Follow-up  DOCUMENTATION CODES:   Not applicable  INTERVENTION:  Continue Glucerna Shake po TID, each supplement provides 220 kcal and 10 grams of protein.  Provide nourishment snacks (Ordered).  Food choices discussed.   Encourage adequate PO intake.   NUTRITION DIAGNOSIS:   Increased nutrient needs related to  (therapy) as evidenced by estimated needs; ongoing  GOAL:   Patient will meet greater than or equal to 90% of their needs; met  MONITOR:   PO intake, Supplement acceptance, Labs, Weight trends, Skin, I & O's  REASON FOR ASSESSMENT:   Malnutrition Screening Tool    ASSESSMENT:   82 y.o. male with history of Bell's palsy, GERD, CAD s/p CABG/ stents (LVEF 45%), HTN, prostate cancer, urethral stricture with chronic UTIs and SPC with recent treatment for UTI. He was admitted on 01/14/16 with altered mental status, abdominal pain and hypotension due to urosepsis. Ultrasound of abdomen done revealing continued mild prominence of pancreatic duct and question of mild intrahepatic biliary ductal dilatation.  RD consulted for poor po and food choices education. Pt reports his mouth has been sore thus unable to wear both denture pieces. Pt only able to wear his top dentures. Diet has been degraded to a dysphagia 2 diet. Pt reports sensitivity to sugar sweetened beverages, thus unable to consume Ensure as he reports it causes his heart to race afterwards. Supplements have been changed to Glucerna instead. Pt reports appetite is good. Meal completion has been 75-100%. Food choices on the menu were discussed with pt and wife at bedside. RD to continue to monitor.  Addendum 1502: RN contacted RD regarding pt with great hunger at night and would like nourishment snacks ordered. RD to order.   Labs and medications reviewed.    Diet Order:  DIET DYS 2 Room service appropriate? Yes; Fluid consistency: Thin  Skin:  Reviewed, no issues  Last BM:  10/30  Height:   Ht  Readings from Last 1 Encounters:  01/21/16 _0  (1.651 m)    Weight:   Wt Readings from Last 1 Encounters:  01/25/16 142 lb 10.2 oz (64.7 kg)    Ideal Body Weight:  61.8 kg  BMI:  Body mass index is 23.74 kg/m.  Estimated Nutritional Needs:   Kcal:  1800-1900  Protein:  75-90 grams  Fluid:  1.8 -1 .9 L/day  EDUCATION NEEDS:   No education needs identified at this time  Corrin Parker, MS, RD, LDN Pager # [phone] After hours/ weekend pager # [phone]

## 2016-01-25 NOTE — Plan of Care (Signed)
Problem: RH Balance Goal: LTG Patient will maintain dynamic standing with ADLs (OT) LTG:  Patient will maintain dynamic standing balance with assist during activities of daily living (OT)   Downgraded 2/2 requirement of safety cuing   Problem: RH Dressing Goal: LTG Patient will perform upper body dressing (OT) LTG Patient will perform upper body dressing with assist, with/without cues (OT).  Downgraded 2/2 requirement of safety cuing  Goal: LTG Patient will perform lower body dressing w/assist (OT) LTG: Patient will perform lower body dressing with assist, with/without cues in positioning using equipment (OT)  Downgraded 2/2 requirement of safety cuing   Problem: RH Toileting Goal: LTG Patient will perform toileting w/assist, cues/equip (OT) LTG: Patient will perform toiletiing (clothes management/hygiene) with assist, with/without cues using equipment (OT)  Downgraded 2/2 requirement of safety cuing   Problem: RH Toilet Transfers Goal: LTG Patient will perform toilet transfers w/assist (OT) LTG: Patient will perform toilet transfers with assist, with/without cues using equipment (OT)  Downgraded 2/2 requirement of safety cuing

## 2016-01-25 NOTE — Progress Notes (Addendum)
Occupational Therapy Session Note  Patient Details  Name: Ronald [name] MRN: [id number] Date of Birth: [birth year]  Today's Date: 01/25/2016 OT Individual Time: 8938-1017 and [phone] OT Individual Time Calculation (min): 57 min and 91 minutes   Short Term Goals: Week 1:  OT Short Term Goal 1 (Week 1): STGs=LTGs due to ELOS  Skilled Therapeutic Interventions/Progress Updates:   Pt participated in skilled OT session focusing on ADL retraining and safe use of walker during functional tasks. Pt reported feeling "gloomy" 2/2 to not going home tomorrow. Pt was provided encouragement and educated on progress with skilled OT with verbalized understanding. Pt was then agreeable to shower. Pt still requires multimodal cues for walker safety during functional transfers and ambulation. Cues for hand placement provided during sit<stand during bathing/dressing and toileting tasks. Pt completes BADLs at supervision level. Oral care completed while standing at sink with supervision. At end of tx, pt transferred to w/c with supervision and left with all needs within reach. No c/o pain during tx.   2nd Session 1:1 tx (91 minutes) Pt participated in skilled OT session focusing on walker safety, community mobility, and activity tolerance/UE strengthening. Pt ambulated with RW (and no AD intermittently with Min A) to gift shop and [place] entrance with supervision and spouse providing w/c follow. Pt required multimodal cues for standing inside of walker and for upright gaze. Cues also required for proper placement of walker during retrieving items from shelves in gift shop. Transfers completed to armless chair, public rest room, outdoor bench, and low couch with emphasis on carryover of safe hand placement during sit<stands. Pt completed with supervision, and after multiple tranfers, required no cues for hand placement. Afterwards pt was able to pathfind way back to room with mod vcs. Theraband exercises completed  at EOB for remainder of tx. Pt was left with spouse and all needs within reach.   Therapy Documentation Precautions:  Precautions Precautions: Fall Restrictions Weight Bearing Restrictions: No   Vital Signs:  Pain: No c/o pain during session    ADL: ADL ADL Comments: Please see functional navigator for ADL status Exercises: Bilateral UE therapeutic exercise with Green theraband x10 reps 2 sets: shoulder flexion, elbow flexion, horizontal abduction, tricep punches, diagonals, overhead punches, shoulder abduction       See Function Navigator for Current Functional Status.   Therapy/Group: Individual Therapy  [name] A [name] 01/25/2016, 12:55 PM

## 2016-01-25 NOTE — Progress Notes (Signed)
Physical Therapy Session Note  Patient Details  Name: Ronald [name] MRN: [id number] Date of Birth: Aug 13, 1927  Today's Date: 01/25/2016 PT Individual Time: 3343-5686 PT Individual Time Calculation (min): 54 min    Short Term Goals: Week 1:  PT Short Term Goal 1 (Week 1): = LTG due to short LOS  Skilled Therapeutic Interventions/Progress Updates:  Pt received in w/c with wife present; pt reporting getting an Ensure earlier but due to sugar content he began to feel jittery and his heart began to race; discussed with RN pt's complaints-RN states MD is aware and has ordered Glucerna.  Pt performed sit > stand with RW and min A and ambulated with RW x 100' in controlled environment with min A with improved foot clearance and step length but continued to require verbal cues to maintain upright trunk.  Performed simulated low car transfer stand pivot with RW with min A.  Performed stair negotiation training up/down 12 stairs with UE support on R rail only with min A overall and step to sequencing with verbal cues for full clearance of LLE.  Discussed AD use at home; pt's goal is to return to ambulating around the house without an AD.  Performed gait assessment x 150' x 2 in controlled environment with no AD but with min A with pt demonstrating improved upright trunk and intermittent shuffling gait; required one rest break due to fatigue.  Returned to room due to needing to have BM.  Pt transferred to toilet without AD with min A and performed toileting tasks with close supervision-min A.  Returned to sit EOB to await lunch with wife present to supervise.    Therapy Documentation Precautions:  Precautions Precautions: Fall Restrictions Weight Bearing Restrictions: No   See Function Navigator for Current Functional Status.   Therapy/Group: Individual Therapy  Raylene Everts Faucette 01/25/2016, 4:56 PM

## 2016-01-26 ENCOUNTER — Inpatient Hospital Stay (HOSPITAL_COMMUNITY): Payer: Medicare Other | Admitting: Occupational Therapy

## 2016-01-26 ENCOUNTER — Inpatient Hospital Stay (HOSPITAL_COMMUNITY): Payer: Medicare Other | Admitting: Physical Therapy

## 2016-01-26 NOTE — Progress Notes (Signed)
[place] PHYSICAL MEDICINE & REHABILITATION     PROGRESS NOTE  Subjective/Complaints:  Pt laying in bed this AM.  He states he had a rough night because he could not get the temperature in his room right.  He requests adjustment to his sleep medications.  He also does not mention anything regarding his mouth, but when specifically asked, he states it feels better.   ROS: Denies CP, SOB, N/V/D  Objective: Vital Signs: Blood pressure (!) 113/53, pulse (!) 57, temperature 97.8 F (36.6 C), temperature source Oral, resp. rate 18, height 5\' 5"  (1.651 m), weight 64.8 kg (142 lb 13.7 oz), SpO2 97 %. No results found. No results for input(s): WBC, HGB, HCT, PLT in the last 72 hours. No results for input(s): NA, K, CL, GLUCOSE, BUN, CREATININE, CALCIUM in the last 72 hours.  Invalid input(s): CO CBG (last 3)  No results for input(s): GLUCAP in the last 72 hours.  Wt Readings from Last 3 Encounters:  01/26/16 64.8 kg (142 lb 13.7 oz)  01/21/16 73.4 kg (161 lb 13.1 oz)  10/26/15 69.7 kg (153 lb 9.6 oz)    Physical Exam:  BP (!) 113/53 (BP Location: Right Arm)   Pulse (!) 57   Temp 97.8 F (36.6 C) (Oral)   Resp 18   Ht 5\' 5"  (1.651 m)   Wt 64.8 kg (142 lb 13.7 oz)   SpO2 97%   BMI 23.77 kg/m  Constitutional: He appears well-developedand well-nourished. NAD. HENT: Normocephalicand atraumatic.  Mouth/Throat: Poor dentition. Mild thrush Eyes: Conjunctivaeand EOM are normal.  Cardiovascular: RRR. No JVD. Murmurheard Respiratory: Effort normaland breath sounds normal. No stridor.  GI: Soft. Bowel sounds are normal. He exhibits no distension. There is no tenderness.  Musculoskeletal: He exhibits no edema. No tenderness. Neurological: Alertand oriented.  HOH Motor: Grossly 4-4+/5 throughout Skin: Skin is warmand dry.  Psychiatric: He has a normal mood and affect. His behavior is normal. Judgmentand thought contentnormal.   Assessment/Plan: 1. Functional deficits secondary  to debility which require 3+ hours per day of interdisciplinary therapy in a comprehensive inpatient rehab setting. Physiatrist is providing close team supervision and 24 hour management of active medical problems listed below. Physiatrist and rehab team continue to assess barriers to discharge/monitor patient progress toward functional and medical goals.  Function:  Bathing Bathing position   Position: Shower  Bathing parts Body parts bathed by patient: Right arm, Left arm, Chest, Abdomen, Front perineal area, Buttocks, Right upper leg, Left upper leg, Right lower leg, Left lower leg, Back Body parts bathed by helper: Right lower leg, Left lower leg, Back  Bathing assist Assist Level: Touching or steadying assistance(Pt > 75%)      Upper Body Dressing/Undressing Upper body dressing   What is the patient wearing?: Pull over shirt/dress     Pull over shirt/dress - Perfomed by patient: Thread/unthread right sleeve, Thread/unthread left sleeve, Put head through opening, Pull shirt over trunk          Upper body assist Assist Level: Supervision or verbal cues      Lower Body Dressing/Undressing Lower body dressing   What is the patient wearing?: Underwear, Pants, Socks, Shoes Underwear - Performed by patient: Thread/unthread right underwear leg, Thread/unthread left underwear leg, Pull underwear up/down   Pants- Performed by patient: Thread/unthread right pants leg, Thread/unthread left pants leg, Pull pants up/down, Fasten/unfasten pants   Non-skid slipper socks- Performed by patient: Don/doff right sock, Don/doff left sock Non-skid slipper socks- Performed by helper: Don/doff right  sock, Don/doff left sock Socks - Performed by patient: Don/doff right sock, Don/doff left sock   Shoes - Performed by patient: Don/doff right shoe, Don/doff left shoe, Fasten right, Fasten left            Lower body assist Assist for lower body dressing: Supervision or verbal cues       Toileting Toileting Toileting activity did not occur: No continent bowel/bladder event Toileting steps completed by patient: Adjust clothing prior to toileting, Performs perineal hygiene, Adjust clothing after toileting Toileting steps completed by helper: Performs perineal hygiene, Adjust clothing after toileting Toileting Assistive Devices: Grab bar or rail  Toileting assist Assist level: Touching or steadying assistance (Pt.75%)   Transfers Chair/bed transfer   Chair/bed transfer method: Stand pivot Chair/bed transfer assist level: Touching or steadying assistance (Pt > 75%) Chair/bed transfer assistive device: Armrests     Locomotion Ambulation     Max distance: 137ft  Assist level: Touching or steadying assistance (Pt > 75%)   Wheelchair   Type: Manual Max wheelchair distance: 174ft  Assist Level: Supervision or verbal cues  Cognition Comprehension Comprehension assist level: Follows basic conversation/direction with no assist  Expression Expression assist level: Expresses complex ideas: With no assist  Social Interaction Social Interaction assist level: Interacts appropriately with others - No medications needed.  Problem Solving Problem solving assist level: Solves complex 90% of the time/cues < 10% of the time  Memory Memory assist level: More than reasonable amount of time     Medical Problem List and Plan: 1. Deconditioning secondary to urosepsis and non-ST MI  Continue CIR  2. DVT Prophylaxis/Anticoagulation: Mechanical: Sequential compression devices, below knee Bilateral lower extremities 3. Pain Management: tylenol prn  4. Mood: LCSW to follow for evaluation and support.  5. Neuropsych: This patient iscapable of making decisions on hisown behalf. 6. Skin/Wound Care: Routine pressure relief measures 7. Fluids/Electrolytes/Nutrition: push PO 8. Citrobacter Braakii/Pseudomonas aeruginosa bacteremia:  Cont cipro through 11/5                 9. NSTEMI: Due  to demand ischemia.   Treated medically as without symptoms  Monitor with increased activity in therapy 10. Persistent leucocytosis: Likely reactive, stable.   Afebrile  Will cont to monitor serially 11. Chronic Thrombocytopenia: Improving  Will monitor for signs of bleeding.  12. Diarrhea: Resolved  D/ced Senna and miralax.  13. Low protein malnutrition: Ensure changed to Glucerna for supplement 10/30.  14. Abnormal LFTs: On high dose statin  Repeat LFTs WNL 15. Urethral stricture/Urinary retention: SPC continues to straight drain.  16. Acute on chronic renal failure: ?baseline Cr-1.44.   Cr. 0.93 on 10/27 17. Mouth discomfort  Diflucan started 10/30 for thrush  Appears to be improving 18. Bradycardia  Asymptomatic on 10/31   Cont to monitor  LOS (Days) 5 A FACE TO FACE EVALUATION WAS PERFORMED  Ankit Lorie Phenix 01/26/2016 8:31 AM

## 2016-01-26 NOTE — Progress Notes (Signed)
Occupational Therapy Session Note  Patient Details  Name: Ronald [name] MRN: [id number] Date of Birth: [birth year]  Today's Date: 01/26/2016 OT Individual Time: 1100-1158 and 1400-1509 OT Individual Time Calculation (min): 58 min and 69 min     Short Term Goals: Week 1:  OT Short Term Goal 1 (Week 1): STGs=LTGs due to ELOS  Skilled Therapeutic Interventions/Progress Updates:     Session 1: Upon entering the room, pt seated in recliner chair with wife present in room. Pt with no c/o pain this session and agreeable to OT intervention. OT demonstrated B UE strengthening exercises with use of 5 lb resistive dowel rod. Pt returned demonstrations x10 reps each with min verbal cues for proper technique. Pt remained seated in recliner at end of session with call bell and all needed items within reach upon exiting the room.   Session 2: Upon entering the room, pt seated in recliner chair awaiting therapist with no c/o pain. Pt ambulated in room with RW to obtain clothing items from dressing with steady assistance when reaching for lower drawer. Pt draped clothing items over RW and ambulated to ADL room for shower. Pt declined TTB for home and verbalized he wanted to step over tub ledge and use a shower chair he had already. Pt demonstrated step over with steady assistance for balance. Pt washed from shower seat with sit <>stand to wash buttocks with steady assistance. Pt exiting the shower and sitting to dress. Pt needing steady assistance for LB dressing. Pt returning to room in same manner as above. Pt impulsive with movements at times such as stepping outside of RW to open door and having LOB requiring mod A to correct. Pt seated in recliner chair with all needs within reach and RN arriving with medications.   Therapy Documentation Precautions:  Precautions Precautions: Fall Restrictions Weight Bearing Restrictions: No Vital Signs: Therapy Vitals Pulse Rate: 64 BP: 123/60 Patient Position (if  appropriate): Sitting Oxygen Therapy SpO2: 100 % O2 Device: Not Delivered Pain: Pain Assessment Pain Assessment: No/denies pain Pain Score: 0-No pain ADL: ADL ADL Comments: Please see functional navigator for ADL status   See Function Navigator for Current Functional Status.   Therapy/Group: Individual Therapy  Ronald [name] 01/26/2016, 12:26 PM

## 2016-01-26 NOTE — Progress Notes (Signed)
Physical Therapy Session Note  Patient Details  Name: Ronald [name] MRN: [id number] Date of Birth: 05-Mar-1928  Today's Date: 01/26/2016 PT Individual Time: 2409-7353 PT Individual Time Calculation (min): 62 min   Short Term Goals: Week 1:  PT Short Term Goal 1 (Week 1): = LTG due to short LOS  Skilled Therapeutic Interventions/Progress Updates:  Pt received seated EOB requesting to use toilet.  Pt performed ambulation to toilet without AD but with min A.  Pt performed all toileting tasks with supervision.  Pt also performed hand and oral hygiene standing at sink with supervision but intermittent verbal cues for LE extension, weight shift/WB L and upright trunk posture during activities.  Pt reports using a treadmill at home every day x 15 min/day after his heart surgery.  Pt is interested in returning to use treadmill at D/C.  Pt agreeable to try treadmill today to determine level to begin at and tolerance/endurance.  Pt ambulated to gym without AD but min A with verbal and tactile cues for full lateral weight shifting L and full step length and foot clearance LLE.  Performed gait on treadmill x 3:42, 243ft, speed 1.0 mph with min A. Pt unable to continue due to pain/rubbing and discomfort in catheter tubing even with buckle to Body Weight Support harness undone.  Pt returned to sitting and vitals assessed with no change in HR.  Discomfort relieved with seated rest break; catheter inspected with no disruption noted.  Continued strength and balance training with standing exercises: heel raises, toe raises, single limb stance x 10 seconds, repeated sit <> stand x 10 seconds with eyes open and eyes closed with bilat UE support.  Will continue to review OTAGO HEP during session tomorrow.  Performed gait back to room and pt set up in recliner with all items within reach.    Therapy Documentation Precautions:  Precautions Precautions: Fall Restrictions Weight Bearing Restrictions: No General:    Vital Signs: Therapy Vitals Pulse Rate: 64 BP: 123/60 Patient Position (if appropriate): Sitting Oxygen Therapy SpO2: 100 % O2 Device: Not Delivered Pain: Pain Assessment Pain Assessment: No/denies pain Pain Score: Asleep   See Function Navigator for Current Functional Status.   Therapy/Group: Individual Therapy  Raylene Everts Faucette 01/26/2016, 10:05 AM

## 2016-01-27 ENCOUNTER — Inpatient Hospital Stay (HOSPITAL_COMMUNITY): Payer: Medicare Other | Admitting: Occupational Therapy

## 2016-01-27 ENCOUNTER — Inpatient Hospital Stay (HOSPITAL_COMMUNITY): Payer: Medicare Other | Admitting: Physical Therapy

## 2016-01-27 NOTE — Progress Notes (Signed)
[place] PHYSICAL MEDICINE & REHABILITATION     PROGRESS NOTE  Subjective/Complaints:  Pt laying in bed this AM.  He slept well overnight and states he is doing well overall.  He denies any oral pain or pain otherwise.  ROS: Denies oral pain, CP, SOB, N/V/D  Objective: Vital Signs: Blood pressure (!) 110/49, pulse 61, temperature 98 F (36.7 C), temperature source Oral, resp. rate 18, height 5\' 5"  (1.651 m), weight 65.2 kg (143 lb 11.8 oz), SpO2 96 %. No results found. No results for input(s): WBC, HGB, HCT, PLT in the last 72 hours. No results for input(s): NA, K, CL, GLUCOSE, BUN, CREATININE, CALCIUM in the last 72 hours.  Invalid input(s): CO CBG (last 3)  No results for input(s): GLUCAP in the last 72 hours.  Wt Readings from Last 3 Encounters:  01/27/16 65.2 kg (143 lb 11.8 oz)  01/21/16 73.4 kg (161 lb 13.1 oz)  10/26/15 69.7 kg (153 lb 9.6 oz)    Physical Exam:  BP (!) 110/49 (BP Location: Right Arm)   Pulse 61   Temp 98 F (36.7 C) (Oral)   Resp 18   Ht 5\' 5"  (1.651 m)   Wt 65.2 kg (143 lb 11.8 oz)   SpO2 96%   BMI 23.92 kg/m  Constitutional: He appears well-developedand well-nourished. NAD. HENT: Normocephalicand atraumatic.  Mouth/Throat: Poor dentition. Mild thrush (improving) Eyes: Conjunctivaeand EOM are normal.  Cardiovascular: RRR. No JVD. Murmurheard Respiratory: Effort normaland breath sounds normal. No stridor.  GI: Soft. Bowel sounds are normal. He exhibits no distension. There is no tenderness.  Musculoskeletal: He exhibits no edema. No tenderness. Neurological: Alertand oriented.  HOH Motor: Grossly 4-4+/5 throughout (unchanged) Skin: Skin is warmand dry.  Psychiatric: He has a normal mood and affect. His behavior is normal. Judgmentand thought contentnormal.   Assessment/Plan: 1. Functional deficits secondary to debility which require 3+ hours per day of interdisciplinary therapy in a comprehensive inpatient rehab  setting. Physiatrist is providing close team supervision and 24 hour management of active medical problems listed below. Physiatrist and rehab team continue to assess barriers to discharge/monitor patient progress toward functional and medical goals.  Function:  Bathing Bathing position   Position: Shower  Bathing parts Body parts bathed by patient: Right arm, Left arm, Chest, Abdomen, Front perineal area, Buttocks, Right upper leg, Left upper leg, Right lower leg, Left lower leg, Back Body parts bathed by helper: Right lower leg, Left lower leg, Back  Bathing assist Assist Level: Touching or steadying assistance(Pt > 75%)      Upper Body Dressing/Undressing Upper body dressing   What is the patient wearing?: Pull over shirt/dress     Pull over shirt/dress - Perfomed by patient: Thread/unthread right sleeve, Thread/unthread left sleeve, Put head through opening, Pull shirt over trunk          Upper body assist Assist Level: Supervision or verbal cues      Lower Body Dressing/Undressing Lower body dressing   What is the patient wearing?: Underwear, Pants, Socks, Shoes Underwear - Performed by patient: Thread/unthread right underwear leg, Thread/unthread left underwear leg, Pull underwear up/down   Pants- Performed by patient: Thread/unthread right pants leg, Thread/unthread left pants leg, Pull pants up/down, Fasten/unfasten pants   Non-skid slipper socks- Performed by patient: Don/doff right sock, Don/doff left sock Non-skid slipper socks- Performed by helper: Don/doff right sock, Don/doff left sock Socks - Performed by patient: Don/doff right sock, Don/doff left sock   Shoes - Performed by patient: Don/doff right  shoe, Don/doff left shoe, Fasten right, Fasten left            Lower body assist Assist for lower body dressing: Supervision or verbal cues      Toileting Toileting Toileting activity did not occur: No continent bowel/bladder event Toileting steps completed  by patient: Adjust clothing prior to toileting, Performs perineal hygiene, Adjust clothing after toileting Toileting steps completed by helper: Performs perineal hygiene, Adjust clothing after toileting Toileting Assistive Devices: Grab bar or rail  Toileting assist Assist level: Touching or steadying assistance (Pt.75%)   Transfers Chair/bed transfer   Chair/bed transfer method: Stand pivot, Ambulatory Chair/bed transfer assist level: Touching or steadying assistance (Pt > 75%) Chair/bed transfer assistive device: Armrests     Locomotion Ambulation     Max distance: 260 Assist level: Touching or steadying assistance (Pt > 75%)   Wheelchair   Type: Manual Max wheelchair distance: 182ft  Assist Level: Supervision or verbal cues  Cognition Comprehension Comprehension assist level: Follows basic conversation/direction with no assist  Expression Expression assist level: Expresses complex ideas: With no assist  Social Interaction Social Interaction assist level: Interacts appropriately with others - No medications needed.  Problem Solving Problem solving assist level: Solves complex 90% of the time/cues < 10% of the time  Memory Memory assist level: More than reasonable amount of time     Medical Problem List and Plan: 1. Deconditioning secondary to urosepsis and non-ST MI  Continue CIR  2. DVT Prophylaxis/Anticoagulation: Mechanical: Sequential compression devices, below knee Bilateral lower extremities 3. Pain Management: tylenol prn  4. Mood: LCSW to follow for evaluation and support.  5. Neuropsych: This patient iscapable of making decisions on hisown behalf. 6. Skin/Wound Care: Routine pressure relief measures 7. Fluids/Electrolytes/Nutrition: push PO 8. Citrobacter Braakii/Pseudomonas aeruginosa bacteremia:  Cont cipro through 11/5                 9. NSTEMI: Due to demand ischemia.   Treated medically as without symptoms  Monitor with increased activity in  therapy 10. Persistent leucocytosis: Likely reactive, stable.   Afebrile  Labs ordered for tomorrow 11. Chronic Thrombocytopenia: Improving  Will monitor for signs of bleeding.   Labs ordered for tomorrow 12. Diarrhea: Resolved  D/ced Senna and miralax.  13. Low protein malnutrition: Ensure changed to Glucerna for supplement 10/30.  14. Abnormal LFTs: On high dose statin  Repeat LFTs WNL 15. Urethral stricture/Urinary retention: SPC continues to straight drain.  16. Acute on chronic renal failure: ?baseline Cr-1.44.   Cr. 0.93 on 10/27  Labs ordered for tomorrow 17. Mouth discomfort  Diflucan started 10/30 for thrush  Appears to be improving 18. Bradycardia  Asymptomatic on 11/1   Cont to monitor  LOS (Days) 6 A FACE TO FACE EVALUATION WAS PERFORMED  Stanford Strauch Lorie Phenix 01/27/2016 7:49 AM

## 2016-01-27 NOTE — Plan of Care (Signed)
Problem: RH BOWEL ELIMINATION Goal: RH STG MANAGE BOWEL WITH ASSISTANCE STG Manage Bowel with Min Assistance.  Outcome: Not Progressing No BM 3 days

## 2016-01-27 NOTE — Progress Notes (Signed)
Physical Therapy Session Note  Patient Details  Name: SHUNSUKE GRANZOW MRN: [id number] Date of Birth: [birth year]  Today's Date: 01/27/2016 PT Individual Time: 1129-1205 PT Individual Time Calculation (min): 36 min   Short Term Goals: Week 1:  PT Short Term Goal 1 (Week 1): = LTG due to short LOS  Skilled Therapeutic Interventions/Progress Updates:   Pt received in recliner with wife present; no c/o pain.  Continued to review with pt OTAGO standing balance HEP seated in arm chair and in hallway with UE support on railing; added in single limb stance, repeated sit <> stands and hamstring and low back stretches in supine.  Pt provided with handouts.  Discussed with pt and wife safety while performing exercises and recommendation to being treadmill training with HHPT.  At end of session pt left in recliner with all items within reach and set up for lunch.  Therapy Documentation Precautions:  Precautions Precautions: Fall Restrictions Weight Bearing Restrictions: No   See Function Navigator for Current Functional Status.   Therapy/Group: Individual Therapy  Raylene Everts Faucette 01/27/2016, 12:50 PM

## 2016-01-27 NOTE — Progress Notes (Signed)
Occupational Therapy Session Note  Patient Details  Name: Ronald [name] MRN: [id number] Date of Birth: February 07, 1928  Today's Date: 01/27/2016 OT Individual Time: 1000-1100 OT Individual Time Calculation (min): 60 min     Short Term Goals:Week 1:  OT Short Term Goal 1 (Week 1): STGs=LTGs due to ELOS  Skilled Therapeutic Interventions/Progress Updates:    Pt seen this session to focus on dynamic balance, LE strength, activity tolerance and family ed with pt's spouse. Pt opted to wait to shower until afternoon session as he was already dressed for the day.  Agreeable to practicing stepping in and out of tub with bars. Pt needs slight steadying A for safety. Discussed with spouse grab bars and recommendations on placement. She is going to try to hire someone to place them.  Pt ambulating with RW first half of session, then stated " I really do not want to use this at home. It will be too difficult to get around the garden".  Requested to ambulated without and was able to with close S to occasional steadying A less than 25 % of the time.   In gym worked on Dietitian with light hand support for alternating front and side lunges and heel raises. Weighted dowel bar shoulder exercises. Pt ambulated back to room with all needs met.   Therapy Documentation Precautions:  Precautions Precautions: Fall Restrictions Weight Bearing Restrictions: No    Vital Signs: Therapy Vitals Temp: 98 F (36.7 C) Temp Source: Oral Pulse Rate: 61 Resp: 18 BP: (!) 110/49 Patient Position (if appropriate): Lying Oxygen Therapy SpO2: 96 % O2 Device: Not Delivered Pain: Pain Assessment Pain Assessment: 0-10 Pain Score: 1  Pain Type: Acute pain Pain Location: Generalized Pain Frequency: Intermittent Pain Intervention(s): Medication (See eMAR) Multiple Pain Sites: No ADL: ADL ADL Comments: Please see functional navigator for ADL status  See Function Navigator for Current Functional  Status.   Therapy/Group: Individual Therapy  SAGUIER,JULIA 01/27/2016, 8:29 AM

## 2016-01-27 NOTE — Progress Notes (Signed)
Physical Therapy Session Note  Patient Details  Name: Ronald [name] MRN: [id number] Date of Birth: 07-21-1927  Today's Date: 01/27/2016 PT Individual Time:  -  16-14-1700 PT Individual Time Calculation. 45 min       Short Term Goals: Week 1:  PT Short Term Goal 1 (Week 1): = LTG due to short LOS  Skilled Therapeutic Interventions/Progress Updates:      Patient  Received sitting in recliner and agreeable to PT.   Gait training in hospital without AD x 169ft, 143ft, 312ft, and 433ft with close supervision assist. Extended rest break required after each rest break. Min cues for improved foot clearance. PT also instructed patient in gait training on uneven surface without AD and close supervision Assist from PT, 116ft x 3 and 41ft. PT encouraged improved posture, improved step length and increased step height. One minor LOB, that patient was able to correct without assist.   Patient left sitting in recliner after treatment with call bell in reach.   Therapy Documentation Precautions:  Precautions Precautions: Fall Restrictions Weight Bearing Restrictions: No   See Function Navigator for Current Functional Status.   Therapy/Group: Individual Therapy  Lorie Phenix 01/27/2016, 11:45 PM

## 2016-01-27 NOTE — Progress Notes (Signed)
Occupational Therapy Session Note  Patient Details  Name: ZADE FALKNER MRN: [id number] Date of Birth: 11-04-1927  Today's Date: 01/27/2016 OT Individual Time: 1500-1559 OT Individual Time Calculation (min): 59 min     Short Term Goals: Week 1:  OT Short Term Goal 1 (Week 1): STGs=LTGs due to ELOS  Skilled Therapeutic Interventions/Progress Updates:    Upon entering the room, pt seated in recliner chair with no c/o pain this session. Pt requesting to shower. Pt ambulating in room without use of device with close supervision to obtain clothing items and needed items for self care. Pt ambulating to ADL apartment for bathing at shower level with steady assist to step over tub ledge. Pt bathing with sit <>stand from shower chair with overall supervision. Pt dressed from chair with steady assist and min cues for safety during LB dressing. Pt ambulated back to room in same manner. Pt seated in wheelchair with call bell and all needed items within reach upon exiting the room.   Therapy Documentation Precautions:  Precautions Precautions: Fall Restrictions Weight Bearing Restrictions: No General:   Vital Signs:  Pain:   ADL: ADL ADL Comments: Please see functional navigator for ADL status Exercises:   Other Treatments:    See Function Navigator for Current Functional Status.   Therapy/Group: Individual Therapy  Phineas Semen 01/27/2016, 7:33 PM

## 2016-01-28 ENCOUNTER — Inpatient Hospital Stay (HOSPITAL_COMMUNITY): Payer: Medicare Other | Admitting: Occupational Therapy

## 2016-01-28 ENCOUNTER — Inpatient Hospital Stay (HOSPITAL_COMMUNITY): Payer: Medicare Other | Admitting: Physical Therapy

## 2016-01-28 DIAGNOSIS — N179 Acute kidney failure, unspecified: Secondary | ICD-10-CM

## 2016-01-28 DIAGNOSIS — D709 Neutropenia, unspecified: Secondary | ICD-10-CM

## 2016-01-28 LAB — BASIC METABOLIC PANEL
Anion gap: 6 (ref 5–15)
BUN: 23 mg/dL — ABNORMAL HIGH (ref 6–20)
CO2: 26 mmol/L (ref 22–32)
Calcium: 8.8 mg/dL — ABNORMAL LOW (ref 8.9–10.3)
Chloride: 105 mmol/L (ref 101–111)
Creatinine, Ser: 1.1 mg/dL (ref 0.61–1.24)
GFR calc Af Amer: >60 mL/min (ref >60)
GFR calc non Af Amer: 59 mL/min — ABNORMAL LOW (ref >60)
Glucose, Bld: 107 mg/dL — ABNORMAL HIGH (ref 65–99)
Potassium: 4.1 mmol/L (ref 3.5–5.1)
Sodium: 137 mmol/L (ref 135–145)

## 2016-01-28 LAB — CBC WITH DIFFERENTIAL/PLATELET
Basophils Absolute: 0 10*3/uL (ref 0.0–0.1)
Basophils Relative: 1 %
Eosinophils Absolute: 0.1 10*3/uL (ref 0.0–0.7)
Eosinophils Relative: 3 %
HCT: 39.4 % (ref 39.0–52.0)
Hemoglobin: 12.9 g/dL — ABNORMAL LOW (ref 13.0–17.0)
Lymphocytes Relative: 30 %
Lymphs Abs: 1.1 10*3/uL (ref 0.7–4.0)
MCH: 29.9 pg (ref 26.0–34.0)
MCHC: 32.7 g/dL (ref 30.0–36.0)
MCV: 91.4 fL (ref 78.0–100.0)
Monocytes Absolute: 0.9 10*3/uL (ref 0.1–1.0)
Monocytes Relative: 26 %
Neutro Abs: 1.5 10*3/uL — ABNORMAL LOW (ref 1.7–7.7)
Neutrophils Relative %: 40 %
Platelets: 201 10*3/uL (ref 150–400)
RBC: 4.31 MIL/uL (ref 4.22–5.81)
RDW: 14.2 % (ref 11.5–15.5)
WBC: 3.5 10*3/uL — ABNORMAL LOW (ref 4.0–10.5)

## 2016-01-28 LAB — CBC
HCT: 41.5 % (ref 39.0–52.0)
Hemoglobin: 13.6 g/dL (ref 13.0–17.0)
MCH: 30.5 pg (ref 26.0–34.0)
MCHC: 32.8 g/dL (ref 30.0–36.0)
MCV: 93 fL (ref 78.0–100.0)
Platelets: 198 10*3/uL (ref 150–400)
RBC: 4.46 MIL/uL (ref 4.22–5.81)
RDW: 14.6 % (ref 11.5–15.5)
WBC: 4.4 10*3/uL (ref 4.0–10.5)

## 2016-01-28 NOTE — Progress Notes (Signed)
Social Work Patient ID: Ronald [name], male   DOB: January 12, 1928, 82 y.o.   MRN: [id number]   Have reviewed team conference with pt and wife. Both aware and agreeable with planned d/c tomorrow with supervision goals being met.  No concerns.  Leone Mobley, LCSW

## 2016-01-28 NOTE — Progress Notes (Signed)
Physical Therapy Discharge Summary  Patient Details  Name: Ronald [name] MRN: [id number] Date of Birth: Oct 15, 1927  Patient has met 6 of 6 long term goals due to improved activity tolerance, improved balance, improved postural control and increased strength.  Patient to discharge at an ambulatory level Supervision.   Patient's care partner is independent to provide the necessary cognitive and supervision assistance at discharge.  Reasons goals not met: n/a  Recommendation:  Patient will benefit from ongoing skilled PT services in home health setting to continue to advance safe functional mobility, address ongoing impairments in LE and core weakness, impaired postural control, balance, gait, endurance/activity tolerance, and minimize fall risk.  Equipment: No equipment provided  Reasons for discharge: treatment goals met and discharge from hospital  Patient/family agrees with progress made and goals achieved: Yes  PT Discharge Precautions/Restrictions Precautions Precautions: Fall Restrictions Weight Bearing Restrictions: No Pain Pain Assessment Pain Assessment: No/denies pain Cognition Overall Cognitive Status: Within Functional Limits for tasks assessed Orientation Level: Oriented X4 Sensation Sensation Light Touch: Appears Intact Stereognosis: Not tested Hot/Cold: Not tested Proprioception: Appears Intact Coordination Gross Motor Movements are Fluid and Coordinated: Yes Fine Motor Movements are Fluid and Coordinated: Not tested Motor  Motor Motor: Abnormal postural alignment and control Motor - Discharge Observations: Generalized weakness, bias towards trunk flexion, lateral flexion, hip and knee flexion during prolonged standing  Mobility Bed Mobility Bed Mobility: Supine to Sit;Sit to Supine Supine to Sit: 6: Modified independent (Device/Increase time) Sit to Supine: 6: Modified independent (Device/Increase time) Transfers Transfers: Yes Stand Pivot Transfers:  6: Modified independent (Device/Increase time) Locomotion  Ambulation Ambulation: Yes Ambulation/Gait Assistance: 5: Supervision Ambulation Distance (Feet): 150 Feet Assistive device: None Gait Gait: Yes Gait Pattern: Impaired Gait Pattern: Step-through pattern;Decreased step length - right;Decreased step length - left;Decreased stride length;Decreased hip/knee flexion - left;Shuffle;Trunk flexed;Decreased trunk rotation;Poor foot clearance - left;Poor foot clearance - right;Right flexed knee in stance;Left flexed knee in stance Stairs / Additional Locomotion Stairs: Yes Stairs Assistance: 5: Supervision Stair Management Technique: One rail Right;Forwards;Alternating pattern;Step to pattern Number of Stairs: 3 Height of Stairs: 6 Wheelchair Mobility Wheelchair Mobility: No  Trunk/Postural Assessment  Cervical Assessment Cervical Assessment: Exceptions to [facility] (forward head) Thoracic Assessment Thoracic Assessment: Exceptions to [facility] (Kyphosis) Lumbar Assessment Lumbar Assessment: Exceptions to [facility] (Posterior pelvic tilt) Postural Control Postural Control: Deficits on evaluation (strong right lean) Righting Reactions: delayed  Balance Balance Balance Assessed: Yes Berg Balance Test Sit to Stand: Able to stand  independently using hands Standing Unsupported: Able to stand 2 minutes with supervision Sitting with Back Unsupported but Feet Supported on Floor or Stool: Able to sit safely and securely 2 minutes Stand to Sit: Controls descent by using hands Transfers: Able to transfer safely, definite need of hands Standing Unsupported with Eyes Closed: Able to stand 10 seconds with supervision Standing Ubsupported with Feet Together: Able to place feet together independently and stand for 1 minute with supervision From Standing, Reach Forward with Outstretched Arm: Reaches forward but needs supervision From Standing Position, Pick up Object from Floor: Able to pick up shoe, needs  supervision From Standing Position, Turn to Look Behind Over each Shoulder: Turn sideways only but maintains balance Turn 360 Degrees: Able to turn 360 degrees safely but slowly Standing Unsupported, Alternately Place Feet on Step/Stool: Able to complete >2 steps/needs minimal assist Standing Unsupported, One Foot in Front: Needs help to step but can hold 15 seconds Standing on One Leg: Unable to try or needs assist to prevent fall  Total Score: 32 Static Sitting Balance Static Sitting - Level of Assistance: 7: Independent Dynamic Sitting Balance Dynamic Sitting - Level of Assistance: 6: Modified independent (Device/Increase time) Static Standing Balance Static Standing - Level of Assistance: 5: Stand by assistance Dynamic Standing Balance Dynamic Standing - Level of Assistance: 5: Stand by assistance Extremity Assessment      RLE Assessment RLE Assessment:  (grossly 3+/5) LLE Assessment LLE Assessment:  (grossly 3+/5)   See Function Navigator for Current Functional Status.  , 01/28/2016, 8:51 AM

## 2016-01-28 NOTE — Patient Care Conference (Signed)
Inpatient RehabilitationTeam Conference and Plan of Care Update Date: 01/27/2016   Time: 2:35 pm    Patient Name: Ronald [name]      Medical Record Number: [id number]  Date of Birth: 15-Apr-1927 Sex: Male         Room/Bed: 4M07C/4M07C-01 Payor Info: Payor: MEDICARE / Plan: MEDICARE PART A AND B / Product Type: *No Product type* /    Admitting Diagnosis: debility   sepsis  Admit Date/Time:  01/21/2016  3:14 PM Admission Comments: No comment available   Primary Diagnosis:  <principal problem not specified> Principal Problem: <principal problem not specified>  Patient Active Problem List   Diagnosis Date Noted  . AKI (acute kidney injury) ([place])   . Neutropenia ([place])   . Pain in oral cavity   . Oral thrush   . Hypoalbuminemia   . Stage 2 chronic kidney disease   . Bacteremia due to Pseudomonas 01/21/2016  . Debility 01/21/2016  . History of Bell's palsy   . Gastroesophageal reflux disease without esophagitis   . Coronary artery disease involving coronary bypass graft of native heart without angina pectoris   . History of prostate cancer   . Suprapubic catheter ([place])   . Thrombocytopenia ([place])   . Hypoalbuminemia due to protein-calorie malnutrition ([place])   . Lymphocytosis   . Incontinence of feces   . Renal failure syndrome   . Acute respiratory failure with hypoxia ([place])   . Bacteremia   . Abdominal pain   . NSTEMI (non-ST elevated myocardial infarction) ([place]) 01/16/2016  . Altered mental state 01/14/2016  . Septic shock ([place])   . Generalized weakness 10/13/2015  . ARF (acute renal failure) ([place]) 10/13/2015  . Sepsis ([place]) 12/05/2014  . Urinary tract infection associated with cystostomy catheter ([place]) 10/03/2014  . Acute lower UTI 09/10/2014  . Fever 09/10/2014  . Urinary frequency 06/30/2014  . Bacteremia due to Enterococcus 01/22/2014  . SIRS (systemic inflammatory response syndrome) ([place]) 02/11/202015  . Chronic systolic CHF (congestive heart failure) ([place]) 02/11/202015   . Urethral stricture 12/25/2013  . Bladder calculus 12/25/2013  . LV dysfunction 09/23/2013  . Chest pain 08/27/2013  . Unstable angina ([place]) 08/27/2013  . Bacteremia due to Escherichia coli 05/20/2012  . Urinary tract infection 09/15/2011  . Calculus of kidney 09/12/2011  . Bradycardia 10/26/2010  . CHEST WALL PAIN, ACUTE 09/04/2009  . CHILLS WITHOUT FEVER 02/10/2009  . HYPERCHOLESTEROLEMIA 10/18/2007  . HTN (hypertension) 10/18/2007  . CELLULITIS, FOOT, LEFT 02/27/2007  . OSTEOARTHRITIS 02/07/2007  . ABDOMINAL PAIN 01/01/2007  . BELL'S PALSY, RIGHT 10/26/2006  . Coronary atherosclerosis 10/26/2006  . PROSTATE CANCER, HX OF 10/26/2006  . NEPHROLITHIASIS, HX OF 10/26/2006    Expected Discharge Date: Expected Discharge Date: 01/29/16  Team Members Present: Physician leading conference: Dr. Delice Lesch Social Worker Present: Lennart Pall, LCSW Nurse Present: Heather Roberts, RN PT Present: Raylene Everts, PT OT Present: Benay Pillow, OT SLP Present: Weldon Inches, SLP PPS Coordinator present : Daiva Nakayama, RN, CRRN     Current Status/Progress Goal Weekly Team Focus  Medical   Deconditioning secondary to urosepsis and non-ST MI  Improve mobility, safety, lab values  See above   Bowel/Bladder   continent of bowel, LBM 01/28/16, Suprapubic catheter in place  continent of bowel, suprapubic catheter care with min assist  educate about suprapubic catheter care   Swallow/Nutrition/ Hydration             ADL's   supervision - min A overall  supervision overall , mod  I grooming and sitting balance  d/c planning, endurance, strength, self care   Mobility   supervision-min A   Supervision overall  endurance, LE strength, posture, balance   Communication             Safety/Cognition/ Behavioral Observations            Pain   no complaints of of pain  pain less than or equal to 4/10  assess for pain q4h and prn   Skin   healing fever blister to lower lip, otherwise skin is WDL  no  new skin breakdown/injury  assess skin q shift and prn    Rehab Goals Patient on target to meet rehab goals: Yes *See Care Plan and progress notes for long and short-term goals.  Barriers to Discharge: Impulsivity, Mouth care, ?CKD, chronic thrombocytopenia, UTI, leukocytosis    Possible Resolutions to Barriers:  Therapies, Diflucan started, follow labs, cont abx    Discharge Planning/Teaching Needs:  Home with wife who can provide supervision      Team Discussion:  Completing tx for UTI.  Monitor labs.  Doing well with therapies;  Close supervision with amb without walker.  Motivated but still with some impulsivity  Revisions to Treatment Plan:  None   Continued Need for Acute Rehabilitation Level of Care: The patient requires daily medical management by a physician with specialized training in physical medicine and rehabilitation for the following conditions: Daily direction of a multidisciplinary physical rehabilitation program to ensure safe treatment while eliciting the highest outcome that is of practical value to the patient.: Yes Daily medical management of patient stability for increased activity during participation in an intensive rehabilitation regime.: Yes Daily analysis of laboratory values and/or radiology reports with any subsequent need for medication adjustment of medical intervention for : Other  Ronald [name] 01/28/2016, 3:36 PM

## 2016-01-28 NOTE — Progress Notes (Signed)
Physical Therapy Note  Patient Details  Name: JEANLUC WEGMAN MRN: [id number] Date of Birth: [birth year] Today's Date: 01/28/2016    Time: 225-750 51 minutes  1:1 No c/o pain. Pt performed gait throughout unit in home and controlled environments with supervision, no LOB, cues to lift feet to avoid shuffling.  Berg balance re-test, improved from 19/56 to 32/56, still at risk for falls. Discussed importance of using AD when on uneven surfaces and having supervision from wife initially to reduce fall risk.  [place] exercise program performed with 2# bilat.  Stair negotiation, car transfers and uneven surfaces with supervision.  Gait x 6 minutes, 4 minutes to improve endurance as pt hopes to return to walking program at home. Pt reports he is excited and ready to d/c home tomorrow.   Anniebell Bedore 01/28/2016, 9:37 AM

## 2016-01-28 NOTE — Progress Notes (Signed)
[place] PHYSICAL MEDICINE & REHABILITATION     PROGRESS NOTE  Subjective/Complaints:  Pt sitting up in bed this AM.  He slept well overnight.  When asked, he states his mouth is back to normal.    ROS: Denies oral pain, CP, SOB, N/V/D  Objective: Vital Signs: Blood pressure (!) 122/58, pulse (!) 58, temperature 98.3 F (36.8 C), temperature source Oral, resp. rate 18, height 5\' 5"  (1.651 m), weight 65.7 kg (144 lb 13.5 oz), SpO2 98 %. No results found.  Recent Labs  01/28/16 0456  WBC 3.5*  HGB 12.9*  HCT 39.4  PLT 201    Recent Labs  01/28/16 0456  NA 137  K 4.1  CL 105  GLUCOSE 107*  BUN 23*  CREATININE 1.10  CALCIUM 8.8*   CBG (last 3)  No results for input(s): GLUCAP in the last 72 hours.  Wt Readings from Last 3 Encounters:  01/28/16 65.7 kg (144 lb 13.5 oz)  01/21/16 73.4 kg (161 lb 13.1 oz)  10/26/15 69.7 kg (153 lb 9.6 oz)    Physical Exam:  BP (!) 122/58 (BP Location: Right Arm)   Pulse (!) 58   Temp 98.3 F (36.8 C) (Oral)   Resp 18   Ht 5\' 5"  (1.651 m)   Wt 65.7 kg (144 lb 13.5 oz)   SpO2 98%   BMI 24.10 kg/m  Constitutional: He appears well-developedand well-nourished. NAD. HENT: Normocephalicand atraumatic.  Mouth/Throat: Poor dentition. Thrush resolved.  Eyes: Conjunctivaeand EOM are normal.  Cardiovascular: RRR. No JVD. Murmurheard Respiratory: Effort normaland breath sounds normal. No stridor.  GI: Soft. Bowel sounds are normal. He exhibits no distension. There is no tenderness.  Musculoskeletal: He exhibits no edema. No tenderness. Neurological: Alertand oriented.  HOH Motor: Grossly 4+/5 throughout  Skin: Skin is warmand dry.  Psychiatric: He has a normal mood and affect. His behavior is normal. Judgmentand thought contentnormal.   Assessment/Plan: 1. Functional deficits secondary to debility which require 3+ hours per day of interdisciplinary therapy in a comprehensive inpatient rehab setting. Physiatrist is providing  close team supervision and 24 hour management of active medical problems listed below. Physiatrist and rehab team continue to assess barriers to discharge/monitor patient progress toward functional and medical goals.  Function:  Bathing Bathing position   Position: Shower  Bathing parts Body parts bathed by patient: Right arm, Left arm, Chest, Abdomen, Front perineal area, Buttocks, Right upper leg, Left upper leg, Right lower leg, Left lower leg, Back Body parts bathed by helper: Right lower leg, Left lower leg, Back  Bathing assist Assist Level: Supervision or verbal cues      Upper Body Dressing/Undressing Upper body dressing   What is the patient wearing?: Pull over shirt/dress     Pull over shirt/dress - Perfomed by patient: Thread/unthread right sleeve, Thread/unthread left sleeve, Put head through opening, Pull shirt over trunk          Upper body assist Assist Level: Supervision or verbal cues      Lower Body Dressing/Undressing Lower body dressing   What is the patient wearing?: Underwear, Pants, Socks, Shoes Underwear - Performed by patient: Thread/unthread right underwear leg, Thread/unthread left underwear leg, Pull underwear up/down   Pants- Performed by patient: Thread/unthread right pants leg, Thread/unthread left pants leg, Pull pants up/down, Fasten/unfasten pants   Non-skid slipper socks- Performed by patient: Don/doff right sock, Don/doff left sock Non-skid slipper socks- Performed by helper: Don/doff right sock, Don/doff left sock Socks - Performed by patient: Don/doff  right sock, Don/doff left sock   Shoes - Performed by patient: Don/doff right shoe, Don/doff left shoe, Fasten right, Fasten left            Lower body assist Assist for lower body dressing: Touching or steadying assistance (Pt > 75%)      Toileting Toileting Toileting activity did not occur: No continent bowel/bladder event Toileting steps completed by patient:  (toileted during  previous shift) Toileting steps completed by helper: Performs perineal hygiene, Adjust clothing after toileting Toileting Assistive Devices: Grab bar or rail  Toileting assist Assist level: Touching or steadying assistance (Pt.75%)   Transfers Chair/bed transfer   Chair/bed transfer method: Ambulatory Chair/bed transfer assist level: Supervision or verbal cues Chair/bed transfer assistive device: Armrests, Medical sales representative     Max distance: 260 Assist level: Touching or steadying assistance (Pt > 75%)   Wheelchair   Type: Manual Max wheelchair distance: 191ft  Assist Level: Supervision or verbal cues  Cognition Comprehension Comprehension assist level: Follows basic conversation/direction with no assist  Expression Expression assist level: Expresses complex ideas: With no assist  Social Interaction Social Interaction assist level: Interacts appropriately with others - No medications needed.  Problem Solving Problem solving assist level: Solves complex 90% of the time/cues < 10% of the time  Memory Memory assist level: More than reasonable amount of time     Medical Problem List and Plan: 1. Deconditioning secondary to urosepsis and non-ST MI  Continue CIR  2. DVT Prophylaxis/Anticoagulation: Mechanical: Sequential compression devices, below knee Bilateral lower extremities 3. Pain Management: tylenol prn  4. Mood: LCSW to follow for evaluation and support.  5. Neuropsych: This patient iscapable of making decisions on hisown behalf. 6. Skin/Wound Care: Routine pressure relief measures 7. Fluids/Electrolytes/Nutrition: push PO 8. Citrobacter Braakii/Pseudomonas aeruginosa bacteremia:  Cont cipro through 11/5                 9. NSTEMI: Due to demand ischemia.   Treated medically as without symptoms  Monitor with increased activity in therapy 10. Persistent leucocytosis: Now with leukopenia   Afebrile  Will repeat labs 11. Chronic Thrombocytopenia:  Resolved  Will monitor for signs of bleeding.  12. Diarrhea: Resolved  D/ced Senna and miralax.  13. Low protein malnutrition: Ensure changed to Glucerna for supplement 10/30.  14. Abnormal LFTs: On high dose statin  Repeat LFTs WNL 15. Urethral stricture/Urinary retention: SPC continues to straight drain.  16. Acute renal injury  Cr. 1.10 on 11/2  Encourage fluid intake 17. Mouth discomfort: Resolved  Diflucan started 10/30 for thrush, d/ced 11/2 18. Bradycardia  Asymptomatic on 11/2   Cont to monitor  LOS (Days) 7 A FACE TO FACE EVALUATION WAS PERFORMED  Ankit Lorie Phenix 01/28/2016 8:09 AM

## 2016-01-28 NOTE — Progress Notes (Signed)
Occupational Therapy Discharge Summary  Patient Details  Name: Ronald [name] MRN: [id number] Date of Birth: Jul 27, 1927  Today's Date: 01/28/2016 OT Individual Time: 1000-1100 and 1500-1559 OT Individual Time Calculation (min): 60 min and 59 min     Patient has met 9 of 9 long term goals due to improved activity tolerance, improved balance, postural control and ability to compensate for deficits.  Patient to discharge at overall Supervision level.  Patient's care partner is independent to provide the necessary cognitive assistance at discharge.    Reasons goals not met: all goals met  Recommendation:  Patient will benefit from ongoing skilled OT services in home health setting to continue to advance functional skills in the area of BADL and iADL.  Equipment: No equipment provided  Reasons for discharge: treatment goals met  Patient/family agrees with progress made and goals achieved: Yes   OT Intervention: Session1: Upon entering the room, pt seated in recliner chair awaiting therapist with no c/o pain this session. Pt ambulated to sink to perform grooming tasks while standing with supervision. Pt ambulated without use of AD and overall supervision onto elevator and to gift shop. Pt ambulating in shop with supervision while navigating store, picking up items, and locating items as asked. Pt verbalized need for toileting in while he was able to utilize public restroom with supervision as well. Pt returned to room at end of session in same manner as above. Pt seated in wheelchair with call bell and all needed items within reach.   Session 2: Upon entering the room, pt ambulating without device to gather needed items for self care. Pt held bag of items while ambulating to ADL apartment for showering. Pt stepping over tub ledge and utilizing shower chair with sit <>stand for bathing. Pt dressing while seated outside of tub with min verbal cues for safety. All tasks performed at overall  supervision level. Pt returned to room in same manner.   OT Discharge Precautions/Restrictions Precautions Precautions: Fall Restrictions Weight Bearing Restrictions: No General   Vital Signs Therapy Vitals Temp: 37 F (36.7 C) Temp Source: Oral Pulse Rate: 60 Resp: 18 BP: 104/63 Patient Position (if appropriate): Sitting Oxygen Therapy SpO2: 100 % O2 Device: Not Delivered Pain Pain Assessment Pain Assessment: No/denies pain Pain Score: 0-No pain ADL ADL ADL Comments: Please see functional navigator for ADL status Vision/Perception  Vision- History Patient Visual Report: No change from baseline  Cognition Overall Cognitive Status: Within Functional Limits for tasks assessed Orientation Level: Oriented X4 Sensation Sensation Light Touch: Appears Intact Stereognosis: Not tested Hot/Cold: Not tested Proprioception: Appears Intact Coordination Gross Motor Movements are Fluid and Coordinated: Yes Fine Motor Movements are Fluid and Coordinated: Yes Motor  Motor Motor - Discharge Observations: Generalized weakness but improved since evalution Mobility  Bed Mobility Bed Mobility: Supine to Sit;Sit to Supine Supine to Sit: 6: Modified independent (Device/Increase time) Sit to Supine: 6: Modified independent (Device/Increase time)  Trunk/Postural Assessment  Cervical Assessment Cervical Assessment: Exceptions to [facility] Thoracic Assessment Thoracic Assessment: Exceptions to [facility] Lumbar Assessment Lumbar Assessment: Exceptions to [facility] Postural Control Postural Control: Deficits on evaluation Righting Reactions: delayed  Balance Balance Balance Assessed: Yes Dynamic Sitting Balance Dynamic Sitting - Level of Assistance: 6: Modified independent (Device/Increase time) Static Standing Balance Static Standing - Level of Assistance: 5: Stand by assistance Extremity/Trunk Assessment RUE Assessment RUE Assessment: Within Functional Limits LUE Assessment LUE Assessment:  Within Functional Limits   See Function Navigator for Current Functional Status.  Ronald [name] 01/28/2016, 3:26  PM

## 2016-01-29 LAB — CBC WITH DIFFERENTIAL/PLATELET
Basophils Absolute: 0 10*3/uL (ref 0.0–0.1)
Basophils Relative: 1 %
Eosinophils Absolute: 0.1 10*3/uL (ref 0.0–0.7)
Eosinophils Relative: 2 %
HCT: 39.7 % (ref 39.0–52.0)
Hemoglobin: 13 g/dL (ref 13.0–17.0)
Lymphocytes Relative: 34 %
Lymphs Abs: 1.5 10*3/uL (ref 0.7–4.0)
MCH: 30.1 pg (ref 26.0–34.0)
MCHC: 32.7 g/dL (ref 30.0–36.0)
MCV: 91.9 fL (ref 78.0–100.0)
Monocytes Absolute: 1.1 10*3/uL — ABNORMAL HIGH (ref 0.1–1.0)
Monocytes Relative: 25 %
Neutro Abs: 1.7 10*3/uL (ref 1.7–7.7)
Neutrophils Relative %: 38 %
Platelets: 179 10*3/uL (ref 150–400)
RBC: 4.32 MIL/uL (ref 4.22–5.81)
RDW: 14.2 % (ref 11.5–15.5)
WBC: 4.4 10*3/uL (ref 4.0–10.5)

## 2016-01-29 MED ORDER — SIMETHICONE 40 MG/0.6ML PO SUSP: 80.0000 mg | Freq: Four times a day (QID) | ORAL | 0 refills | Status: DC | PRN

## 2016-01-29 MED ORDER — CIPROFLOXACIN HCL 500 MG PO TABS: 500.0000 mg | ORAL_TABLET | Freq: Two times a day (BID) | ORAL | 0 refills | Status: DC

## 2016-01-29 NOTE — Discharge Instructions (Signed)
Inpatient Rehab Discharge Instructions  Ronald [name] Discharge date and time:    Activities/Precautions/ Functional Status: Activity: no lifting, driving, or strenuous exercise till cleared by MD Diet: cardiac diet Wound Care: Continue supra-pubic care twice a day. Keep wound clean and dry  Functional status:  ___ No restrictions     ___ Walk up steps independently _X__ 24/7 supervision/assistance   ___ Walk up steps with assistance ___ Intermittent supervision/assistance  ___ Bathe/dress independently _X__ Walk with walker    _X__ Bathe/dress with assistance ___ Walk Independently    ___ Shower independently ___ Walk with assistance    ___ Shower with assistance _X__ No alcohol     ___ Return to work/school ________   Special Instructions: 1. Drink plenty of fluids.  2. Contact urology if urine get cloudy or malodorous (strong smell)   COMMUNITY REFERRALS UPON DISCHARGE:    Home Health:   PT,OT,RN  [place]   Date of last service:01/29/2016  Medical Equipment/Items Ordered:HAD FROM PREVIOUS ADMITS    My questions have been answered and I understand these instructions. I will adhere to these goals and the provided educational materials after my discharge from the hospital.  Patient/Caregiver Signature _______________________________ Date __________  Clinician Signature _______________________________________ Date __________  Please bring this form and your medication list with you to all your follow-up doctor's appointments.

## 2016-01-29 NOTE — Progress Notes (Signed)
Social Work  Discharge Note  The overall goal for the admission was met for:   Discharge location: Yes-HOME WITH WIFE WHO CAN PROVIDE SUPERVISION LEVEL  Length of Stay: Yes-8 DAYS  Discharge activity level: Yes-SUPERVISION LEVEL  Home/community participation: Yes  Services provided included: MD, RD, PT, OT, RN, CM, Pharmacy and SW  Financial Services: Medicare and Private Insurance: [place]  Follow-up services arranged: Home Health: [place] [facility]-PT,OT,RN and Patient/Family request agency HH: HAD BEFORE, DME: NO NEEDS  Comments (or additional information):  Patient/Family verbalized understanding of follow-up arrangements: Yes  Individual responsible for coordination of the follow-up plan: SELF & SHIRLEY-WIFE  Confirmed correct DME delivered: Elease Hashimoto 01/29/2016    Elease Hashimoto

## 2016-01-29 NOTE — Progress Notes (Signed)
[place] PHYSICAL MEDICINE & REHABILITATION     PROGRESS NOTE  Subjective/Complaints:  Pt sitting up in bed this AM.  He is looking forward to discharge.    ROS: Denies oral pain, CP, SOB, N/V/D  Objective: Vital Signs: Blood pressure 124/68, pulse 68, temperature 97.9 F (36.6 C), temperature source Oral, resp. rate 16, height 5\' 5"  (1.651 m), weight 66 kg (145 lb 8.1 oz), SpO2 100 %. No results found.  Recent Labs  01/28/16 1130 01/29/16 0524  WBC 4.4 4.4  HGB 13.6 13.0  HCT 41.5 39.7  PLT 198 179    Recent Labs  01/28/16 0456  NA 137  K 4.1  CL 105  GLUCOSE 107*  BUN 23*  CREATININE 1.10  CALCIUM 8.8*   CBG (last 3)  No results for input(s): GLUCAP in the last 72 hours.  Wt Readings from Last 3 Encounters:  01/29/16 66 kg (145 lb 8.1 oz)  01/21/16 73.4 kg (161 lb 13.1 oz)  10/26/15 69.7 kg (153 lb 9.6 oz)    Physical Exam:  BP 124/68 (BP Location: Left Arm)   Pulse 68   Temp 97.9 F (36.6 C) (Oral)   Resp 16   Ht 5\' 5"  (1.651 m)   Wt 66 kg (145 lb 8.1 oz)   SpO2 100%   BMI 24.21 kg/m  Constitutional: He appears well-developedand well-nourished. NAD. HENT: Normocephalicand atraumatic.  Mouth/Throat: Poor dentition.  Eyes: Conjunctivaeand EOM are normal.  Cardiovascular: RRR. No JVD. Murmur, Respiratory: Effort normaland breath sounds normal. No stridor.  GI: Soft. Bowel sounds are normal. He exhibits no distension. There is no tenderness.  Musculoskeletal: He exhibits no edema. No tenderness. Neurological: Alertand oriented.  HOH Motor: Grossly 4+/5 throughout (unchanged) Skin: Skin is warmand dry.  Psychiatric: He has a normal mood and affect. His behavior is normal. Judgmentand thought contentnormal.   Assessment/Plan: 1. Functional deficits secondary to debility which require 3+ hours per day of interdisciplinary therapy in a comprehensive inpatient rehab setting. Physiatrist is providing close team supervision and 24 hour  management of active medical problems listed below. Physiatrist and rehab team continue to assess barriers to discharge/monitor patient progress toward functional and medical goals.  Function:  Bathing Bathing position   Position: Shower  Bathing parts Body parts bathed by patient: Right arm, Left arm, Chest, Abdomen, Front perineal area, Buttocks, Right upper leg, Left upper leg, Right lower leg, Left lower leg, Back Body parts bathed by helper: Right lower leg, Left lower leg, Back  Bathing assist Assist Level: Supervision or verbal cues      Upper Body Dressing/Undressing Upper body dressing   What is the patient wearing?: Pull over shirt/dress     Pull over shirt/dress - Perfomed by patient: Thread/unthread right sleeve, Thread/unthread left sleeve, Put head through opening, Pull shirt over trunk          Upper body assist Assist Level: Supervision or verbal cues      Lower Body Dressing/Undressing Lower body dressing   What is the patient wearing?: Underwear, Pants, Socks, Shoes Underwear - Performed by patient: Thread/unthread right underwear leg, Thread/unthread left underwear leg, Pull underwear up/down   Pants- Performed by patient: Thread/unthread right pants leg, Thread/unthread left pants leg, Pull pants up/down, Fasten/unfasten pants   Non-skid slipper socks- Performed by patient: Don/doff right sock, Don/doff left sock Non-skid slipper socks- Performed by helper: Don/doff right sock, Don/doff left sock Socks - Performed by patient: Don/doff right sock, Don/doff left sock   Shoes -  Performed by patient: Don/doff right shoe, Don/doff left shoe, Fasten right, Fasten left            Lower body assist Assist for lower body dressing: Supervision or verbal cues      Toileting Toileting Toileting activity did not occur: No continent bowel/bladder event Toileting steps completed by patient: Adjust clothing prior to toileting, Adjust clothing after toileting,  Performs perineal hygiene Toileting steps completed by helper: Performs perineal hygiene, Adjust clothing after toileting Toileting Assistive Devices: Grab bar or rail  Toileting assist Assist level: Supervision or verbal cues   Transfers Chair/bed transfer   Chair/bed transfer method: Ambulatory Chair/bed transfer assist level: No Help, no cues, assistive device, takes more than a reasonable amount of time Chair/bed transfer assistive device: Armrests, Medical sales representative     Max distance: 400 Assist level: Supervision or verbal cues   Wheelchair   Type: Manual Max wheelchair distance: 174ft  Assist Level: Supervision or verbal cues  Cognition Comprehension Comprehension assist level: Follows basic conversation/direction with no assist  Expression Expression assist level: Expresses complex ideas: With no assist  Social Interaction Social Interaction assist level: Interacts appropriately with others - No medications needed.  Problem Solving Problem solving assist level: Solves complex 90% of the time/cues < 10% of the time  Memory Memory assist level: More than reasonable amount of time     Medical Problem List and Plan: 1. Deconditioning secondary to urosepsis and non-ST MI  Continue CIR  2. DVT Prophylaxis/Anticoagulation: Mechanical: Sequential compression devices, below knee Bilateral lower extremities 3. Pain Management: tylenol prn  4. Mood: LCSW to follow for evaluation and support.  5. Neuropsych: This patient iscapable of making decisions on hisown behalf. 6. Skin/Wound Care: Routine pressure relief measures 7. Fluids/Electrolytes/Nutrition: push PO 8. Citrobacter Braakii/Pseudomonas aeruginosa bacteremia:  Cont cipro through 11/5                 9. NSTEMI: Due to demand ischemia.   Treated medically as without symptoms  Monitor with increased activity in therapy 10. Persistent leucocytosis: Resolved 11. Chronic Thrombocytopenia: Resolved 12.  Diarrhea: Resolved  D/ced Senna and miralax.  13. Low protein malnutrition: Ensure changed to Glucerna for supplement 10/30.  14. Abnormal LFTs: On high dose statin  Repeat LFTs WNL 15. Urethral stricture/Urinary retention: SPC continues to straight drain.  16. Acute renal injury  Cr. 1.10 on 11/2  Encourage fluid intake 17. Mouth discomfort: Resolved  Diflucan started 10/30 for thrush, d/ced 11/2 18. Bradycardia  Asymptomatic and improved on 11/3   Cont to monitor  LOS (Days) 8 A FACE TO FACE EVALUATION WAS PERFORMED  Ankit Lorie Phenix 01/29/2016 8:54 AM

## 2016-01-29 NOTE — Discharge Summary (Signed)
Physician Discharge Summary  Patient ID: Ronald [name] MRN: [id number] DOB/AGE: [birth year] 82 y.o.  Admit date: 01/21/2016 Discharge date: 01/29/2016  Discharge Diagnoses:  Active Problems:   Debility   Stage 2 chronic kidney disease   Pain in oral cavity   Oral thrush   Hypoalbuminemia   AKI (acute kidney injury) ([place])   Neutropenia (HCC)   Discharged Condition: Stable    Labs:  Basic Metabolic Panel: BMP Latest Ref Rng & Units 01/28/2016 01/22/2016 01/20/2016  Glucose 65 - 99 mg/dL 107(H) 112(H) 119(H)  BUN 6 - 20 mg/dL 23(H) 18 21(H)  Creatinine 0.61 - 1.24 mg/dL 1.10 0.93 0.90  Sodium 135 - 145 mmol/L 137 138 138  Potassium 3.5 - 5.1 mmol/L 4.1 4.2 4.0  Chloride 101 - 111 mmol/L 105 107 105  CO2 22 - 32 mmol/L 26 23 25   Calcium 8.9 - 10.3 mg/dL 8.8(L) 8.6(L) 9.0    CBC:  Recent Labs Lab 01/28/16 0456 01/28/16 1130 01/29/16 0524  WBC 3.5* 4.4 4.4  NEUTROABS 1.5*  --  1.7  HGB 12.9* 13.6 13.0  HCT 39.4 41.5 39.7  MCV 91.4 93.0 91.9  PLT 201 198 179    CBG: No results for input(s): GLUCAP in the last 168 hours.   Brief HPI:   Ronald [name] a 82 y.o.malewith history ofBell's palsy, GERD, CAD s/p CABG/ stents (LVEF 45%), HTN, prostate cancer, urethral stricture with chronic UTIs and SPC with recent treatment for UTI. He was admitted on 10/19/17with altered mental status, abdominal pain and hypotension due to urosepsis. He was started on pressors and IV antibiotics and SPC changed out by urology. Blood cultures positive for citrobacter Braaki and Pseudomonas Aeruginosa. Cardiology consulted for elevated troponin's and ST changes felt to be due to demand ischemia. As patient asymptomatic and cardiac cath deferred due to thrombocytopenia as well as acute renal failure. He was treated medically and antibiotics adjusted for better coverage. Repeat [facility] 10/23 negative and IM recommended cipro thorough 11/5. PT evaluation done 10/24 showing patient to be  debilitated and CIR recommended for follow up therapy.    Hospital Course: Ronald [name] was admitted to rehab 01/21/2016 for inpatient therapies to consist of PT and OT at least three hours five days a week. Past admission physiatrist, therapy team and rehab RN have worked together to provide customized collaborative inpatient rehab. Blood pressures were monitored on bid basis and have been well controlled. No cardiac symptoms reported with increase in activity level.  He has been afebrile during his stay and is to continue cipro thorough 11/5 to complete his antibiotic regimen. Persistent leucocytosis has been monitored with serial checks and has resolved with improvement in platelets. He continued to complete about sore throat/mouth and was treated with 5 day course of diflucan for thrush.  As soreness resolved, his intake improved but follow up labs revealed pre-renal azotemia and he was encouraged to increase fluid intake. He has made steady progress during his rehab stay and is at supervision level. He will continue to resume follow up [place], [place] and [place] [facility] [place] after discharge.   Rehab course: During patient's stay in rehab weekly team conferences were held to monitor patient's progress, set goals and discuss barriers to discharge. At admission, patient required min assist with mobility and moderate assist with self care tasks. He has had improvement in activity tolerance, balance, postural control, as well as ability to compensate for deficits. He is able to complete ADL tasks with supervision.  He modified independent for transfers and is able to ambulate 150' with supervision. Berg Score has improved from 19/56 to 32/56 but still at risk for falls and use of AD has been reinforced with patient.    Disposition: 01-Home or Self Care  Diet: Heart Healthy.   Special Instructions: 1. Continue SP care bid. 2. Drink plenty of fluids. Contact Urology if urine gets malodorous or  cloudy. 3. Follow up with Primary MD on 11/13 at 11:15 am for post hospital follow up.    Discharge Instructions    Ambulatory referral to Physical Medicine Rehab    Complete by:  As directed    1-2 week follow up       Medication List    STOP taking these medications   HYDROcodone-acetaminophen 5-325 MG tablet Commonly known as:  NORCO/VICODIN   isosorbide mononitrate 60 MG 24 hr tablet Commonly known as:  IMDUR     TAKE these medications   acetaminophen 500 MG tablet Commonly known as:  TYLENOL Take 500 mg by mouth every 6 (six) hours as needed for pain or fever.   aspirin 81 MG EC tablet Take 1 tablet (81 mg total) by mouth daily.   atorvastatin 80 MG tablet Commonly known as:  LIPITOR Take 0.5 tablet (40 mg total) by mouth every evening.   ciprofloxacin 500 MG tablet Commonly known as:  CIPRO Take 1 tablet (500 mg total) by mouth 2 (two) times daily. What changed:  additional instructions   flavoxATE 100 MG tablet Commonly known as:  URISPAS Take 100 mg by mouth 3 (three) times daily as needed for bladder spasms.   galantamine 8 MG 24 hr capsule Commonly known as:  RAZADYNE ER Take 8 mg by mouth daily with breakfast.   hydroxypropyl methylcellulose / hypromellose 2.5 % ophthalmic solution Commonly known as:  ISOPTO TEARS / GONIOVISC Place 1 drop into both eyes 2 (two) times daily.   multivitamin with minerals Tabs tablet Take 1 tablet by mouth daily.   nitroGLYCERIN 0.4 MG SL tablet Commonly known as:  NITROSTAT Place 0.4 mg under the tongue every 5 (five) minutes as needed for chest pain.   simethicone 40 MG/0.6ML drops Commonly known as:  MYLICON Take 1.2 mLs (80 mg total) by mouth every 6 (six) hours as needed for flatulence.   Vitamin D 2000 units tablet Take 2,000 Units by mouth daily.      Follow-up Information    Ankit Lorie Phenix, MD .   Specialty:  Physical Medicine and Rehabilitation Why:  office will call you with follow up  appointment  Contact information: [address] [place] [address] [facility] .   Why:  follow up at your routine appointment Contact information: [place] [address] [phone]           Signed: Bary Leriche 01/29/2016, 5:19 PM

## 2016-01-30 DIAGNOSIS — K219 Gastro-esophageal reflux disease without esophagitis: Secondary | ICD-10-CM | POA: Diagnosis not present

## 2016-01-30 DIAGNOSIS — I130 Hypertensive heart and chronic kidney disease with heart failure and stage 1 through stage 4 chronic kidney disease, or unspecified chronic kidney disease: Secondary | ICD-10-CM | POA: Diagnosis not present

## 2016-01-30 DIAGNOSIS — Z87440 Personal history of urinary (tract) infections: Secondary | ICD-10-CM | POA: Diagnosis not present

## 2016-01-30 DIAGNOSIS — E46 Unspecified protein-calorie malnutrition: Secondary | ICD-10-CM | POA: Diagnosis not present

## 2016-01-30 DIAGNOSIS — Z87891 Personal history of nicotine dependence: Secondary | ICD-10-CM | POA: Diagnosis not present

## 2016-01-30 DIAGNOSIS — M150 Primary generalized (osteo)arthritis: Secondary | ICD-10-CM | POA: Diagnosis not present

## 2016-01-30 DIAGNOSIS — I214 Non-ST elevation (NSTEMI) myocardial infarction: Secondary | ICD-10-CM | POA: Diagnosis not present

## 2016-01-30 DIAGNOSIS — I5022 Chronic systolic (congestive) heart failure: Secondary | ICD-10-CM | POA: Diagnosis not present

## 2016-01-30 DIAGNOSIS — E785 Hyperlipidemia, unspecified: Secondary | ICD-10-CM | POA: Diagnosis not present

## 2016-01-30 DIAGNOSIS — I255 Ischemic cardiomyopathy: Secondary | ICD-10-CM | POA: Diagnosis not present

## 2016-01-30 DIAGNOSIS — Z9359 Other cystostomy status: Secondary | ICD-10-CM | POA: Diagnosis not present

## 2016-01-30 DIAGNOSIS — Z955 Presence of coronary angioplasty implant and graft: Secondary | ICD-10-CM | POA: Diagnosis not present

## 2016-01-30 DIAGNOSIS — Z951 Presence of aortocoronary bypass graft: Secondary | ICD-10-CM | POA: Diagnosis not present

## 2016-01-30 DIAGNOSIS — N390 Urinary tract infection, site not specified: Secondary | ICD-10-CM | POA: Diagnosis not present

## 2016-01-30 DIAGNOSIS — N182 Chronic kidney disease, stage 2 (mild): Secondary | ICD-10-CM | POA: Diagnosis not present

## 2016-01-30 DIAGNOSIS — F0390 Unspecified dementia without behavioral disturbance: Secondary | ICD-10-CM | POA: Diagnosis not present

## 2016-01-30 DIAGNOSIS — I2510 Atherosclerotic heart disease of native coronary artery without angina pectoris: Secondary | ICD-10-CM | POA: Diagnosis not present

## 2016-01-30 DIAGNOSIS — D696 Thrombocytopenia, unspecified: Secondary | ICD-10-CM | POA: Diagnosis not present

## 2016-01-30 DIAGNOSIS — Z7982 Long term (current) use of aspirin: Secondary | ICD-10-CM | POA: Diagnosis not present

## 2016-01-30 DIAGNOSIS — Z8546 Personal history of malignant neoplasm of prostate: Secondary | ICD-10-CM | POA: Diagnosis not present

## 2016-01-30 DIAGNOSIS — R339 Retention of urine, unspecified: Secondary | ICD-10-CM | POA: Diagnosis not present

## 2016-02-01 ENCOUNTER — Telehealth: Payer: Self-pay | Admitting: Physical Medicine & Rehabilitation

## 2016-02-01 ENCOUNTER — Other Ambulatory Visit: Payer: Self-pay

## 2016-02-01 DIAGNOSIS — I5022 Chronic systolic (congestive) heart failure: Secondary | ICD-10-CM | POA: Diagnosis not present

## 2016-02-01 DIAGNOSIS — D696 Thrombocytopenia, unspecified: Secondary | ICD-10-CM | POA: Diagnosis not present

## 2016-02-01 DIAGNOSIS — N182 Chronic kidney disease, stage 2 (mild): Secondary | ICD-10-CM | POA: Diagnosis not present

## 2016-02-01 DIAGNOSIS — I130 Hypertensive heart and chronic kidney disease with heart failure and stage 1 through stage 4 chronic kidney disease, or unspecified chronic kidney disease: Secondary | ICD-10-CM | POA: Diagnosis not present

## 2016-02-01 DIAGNOSIS — I214 Non-ST elevation (NSTEMI) myocardial infarction: Secondary | ICD-10-CM | POA: Diagnosis not present

## 2016-02-01 DIAGNOSIS — N390 Urinary tract infection, site not specified: Secondary | ICD-10-CM | POA: Diagnosis not present

## 2016-02-01 NOTE — Telephone Encounter (Signed)
Merry Proud PT with [facility] needs to get verbal orders 2w3 for lower extremity strengthening, balance and gait training.  Please call him at [phone].

## 2016-02-01 NOTE — Patient Outreach (Signed)
Transition of care call: Placed call to patient for transition of care. Spoke with Wife [name] ( who is on consent) Explained reason for call. Explained [facility] program.  Wife reports that patient is doing well with the exception of continuing to be weak. Reports home health has started. Reports PT came today and OT is coming tomorrow.   PLAN: Home visit planned for 02/03/2016.  Provided my contact information. Will plan weekly outreach attempts.   [facility] CM Care Plan Problem One   Flowsheet Row Most Recent Value  Care Plan Problem One  Recent hospital admission for UTI  Role Documenting the Problem One  Care Management [place] for Problem One  Active  [facility] Long Term Goal (31-90 days)  Patient will report no readmissions in the next 31 days.  [facility]N Long Term Goal Start Date  02/01/16  Interventions for Problem One Long Term Goal  Reviewed discharge instructions. Planned home visit. Provided my contact information.  [facility] CM Short Term Goal #1 (0-30 days)  Patient will follow up with primary MD in the next 10 days  [facility] CM Short Term Goal #1 Start Date  02/01/16  Interventions for Short Term Goal #1  Reviewed importance of timely follow up with MD. reviewed reasons to call MD for change in urine color or odor, fever, chills or confusion  [facility] CM Short Term Goal #2 (0-30 days)  Patient will report drinking atleast 5-6 cups of water/liquid daily for the next 30 days.  [facility] CM Short Term Goal #2 Start Date  02/01/16  Interventions for Short Term Goal #2  Reviewed importance of staying hydrated to assist with avoiding future UTI's.      Will send this note to MD with Barrier letter.  Tomasa Rand, RN, BSN, CEN [facility] [employer] [phone]

## 2016-02-01 NOTE — Telephone Encounter (Signed)
Approval given

## 2016-02-02 DIAGNOSIS — I214 Non-ST elevation (NSTEMI) myocardial infarction: Secondary | ICD-10-CM | POA: Diagnosis not present

## 2016-02-02 DIAGNOSIS — I5022 Chronic systolic (congestive) heart failure: Secondary | ICD-10-CM | POA: Diagnosis not present

## 2016-02-02 DIAGNOSIS — N390 Urinary tract infection, site not specified: Secondary | ICD-10-CM | POA: Diagnosis not present

## 2016-02-02 DIAGNOSIS — D696 Thrombocytopenia, unspecified: Secondary | ICD-10-CM | POA: Diagnosis not present

## 2016-02-02 DIAGNOSIS — I130 Hypertensive heart and chronic kidney disease with heart failure and stage 1 through stage 4 chronic kidney disease, or unspecified chronic kidney disease: Secondary | ICD-10-CM | POA: Diagnosis not present

## 2016-02-02 DIAGNOSIS — N182 Chronic kidney disease, stage 2 (mild): Secondary | ICD-10-CM | POA: Diagnosis not present

## 2016-02-03 ENCOUNTER — Other Ambulatory Visit: Payer: Self-pay

## 2016-02-03 ENCOUNTER — Encounter: Payer: Medicare Other | Admitting: Physical Medicine & Rehabilitation

## 2016-02-03 DIAGNOSIS — N390 Urinary tract infection, site not specified: Secondary | ICD-10-CM | POA: Diagnosis not present

## 2016-02-03 DIAGNOSIS — I214 Non-ST elevation (NSTEMI) myocardial infarction: Secondary | ICD-10-CM | POA: Diagnosis not present

## 2016-02-03 DIAGNOSIS — I130 Hypertensive heart and chronic kidney disease with heart failure and stage 1 through stage 4 chronic kidney disease, or unspecified chronic kidney disease: Secondary | ICD-10-CM | POA: Diagnosis not present

## 2016-02-03 DIAGNOSIS — D696 Thrombocytopenia, unspecified: Secondary | ICD-10-CM | POA: Diagnosis not present

## 2016-02-03 DIAGNOSIS — N182 Chronic kidney disease, stage 2 (mild): Secondary | ICD-10-CM | POA: Diagnosis not present

## 2016-02-03 DIAGNOSIS — I5022 Chronic systolic (congestive) heart failure: Secondary | ICD-10-CM | POA: Diagnosis not present

## 2016-02-03 NOTE — Patient Outreach (Signed)
[facility]) Care Management   02/03/2016  Ronald [name] Aug 17, 1927 [id number]  Ronald [name] is an 82 y.o. male 10:30 am  Arrived for home visit. Wife Ronald [name] present and engaged during home visit.  Subjective: Patient reports that he gets a UTI about every 3 months and lands in the hospital. Reports no warning signs.  Reports normally has a sudden onset of weakness. With this admission patient was unable to get out of chair.  Patient reports that he has had a suprapubic catheter for about 1-2 years. Patient reports that he has completed his antibiotics and has a follow up planned with urologist, Dr. Risa [name] on 02/05/2016. Reports he has a follow up planned with primary MD on 02/08/2016.  Patient reports that he takes his medications as prescribed without difficulty.  Patient is currently active with home health nursing, PT and OT.  Patient reports that he is walking daily and trying to increase his strength. Wife reports that patient had a great stay at inpatient rehab and it was very helpful for patient.   Objective:  Awake and alert. Able to verbalize day, month, year, next holiday without difficulty.  Ambulating in home without any assistive devices today. Suprapubic catheter intact to leg bag. Patient reports normal heart rate of 50-60.  Vitals:   02/03/16 1045  BP: 128/60  Pulse: (!) 52  Resp: 16  SpO2: 97%  Weight: 145 lb (65.8 kg)  Height: 1.651 m (5\' 5" )   Review of Systems  Constitutional: Negative.   HENT: Positive for hearing loss.        Wears hearing aids  Eyes: Negative.   Respiratory: Negative.   Cardiovascular: Negative.   Gastrointestinal: Negative.   Genitourinary: Negative.        Reports no concerns about suprapubic catheter. States that he goes to the urology center every 6 weeks to get catheter changed.   Musculoskeletal: Negative.   Skin:       Report soreness to buttocks. States that he got constipated while in the hospital.  Reports  he was given laxatives and then developed diarrhea. States ever since the diarrhea he has had a sore bottom.   Neurological: Negative.   Endo/Heme/Allergies: Negative.   Psychiatric/Behavioral: Negative.        States he reads at night to go to sleep.    Physical Exam  Constitutional: He is oriented to person, place, and time. He appears well-developed and well-nourished.  Cardiovascular: Regular rhythm and intact distal pulses.   Respiratory: Effort normal and breath sounds normal.  Lungs clear  GI: Soft. Bowel sounds are normal.  Musculoskeletal: Normal range of motion. He exhibits no edema.  Neurological: He is alert and oriented to person, place, and time.  Skin: Skin is warm and dry.  Buttocks red.  Small area of open skin between buttock cheeks. Wife applying barrier cream.  Psychiatric: He has a normal mood and affect. His behavior is normal. Judgment and thought content normal.    Encounter Medications:   Outpatient Encounter Prescriptions as of 02/03/2016  Medication Sig Note  . acetaminophen (TYLENOL) 500 MG tablet Take 500 mg by mouth every 6 (six) hours as needed for pain or fever.    Marland Kitchen aspirin EC 81 MG EC tablet Take 1 tablet (81 mg total) by mouth daily.   Marland Kitchen atorvastatin (LIPITOR) 80 MG tablet Take 1 tablet (80 mg total) by mouth every evening.   . Cholecalciferol (VITAMIN D) 2000 UNITS tablet Take 2,000 Units  by mouth daily.   . ciprofloxacin (CIPRO) 500 MG tablet Take 1 tablet (500 mg total) by mouth 2 (two) times daily. (Patient not taking: Reported on 02/01/2016) 02/01/2016: Completed doses on 01/31/2016  . flavoxATE (URISPAS) 100 MG tablet Take 100 mg by mouth 3 (three) times daily as needed for bladder spasms.   Marland Kitchen galantamine (RAZADYNE ER) 8 MG 24 hr capsule Take 8 mg by mouth daily with breakfast.   . hydroxypropyl methylcellulose (ISOPTO TEARS) 2.5 % ophthalmic solution Place 1 drop into both eyes 2 (two) times daily.    . Multiple Vitamin (MULTIVITAMIN WITH MINERALS)  TABS Take 1 tablet by mouth daily.   . nitroGLYCERIN (NITROSTAT) 0.4 MG SL tablet Place 0.4 mg under the tongue every 5 (five) minutes as needed for chest pain.    . simethicone (MYLICON) 40 GD/9.2EQ drops Take 1.2 mLs (80 mg total) by mouth every 6 (six) hours as needed for flatulence.    No facility-administered encounter medications on file as of 02/03/2016.     Functional Status:   In your present state of health, do you have any difficulty performing the following activities: 02/03/2016 01/21/2016  Hearing? N N  Vision? N N  Difficulty concentrating or making decisions? Y N  Walking or climbing stairs? N Y  Dressing or bathing? N Y  Doing errands, shopping? Y N  Preparing Food and eating ? N -  Using the Toilet? N -  In the past six months, have you accidently leaked urine? N -  Do you have problems with loss of bowel control? N -  Managing your Medications? Y -  Managing your Finances? N -  Housekeeping or managing your Housekeeping? N -  Some recent data might be hidden    Fall/Depression Screening:    PHQ 2/9 Scores 02/03/2016 10/26/2015 01/22/2014 09/17/2013  PHQ - 2 Score 0 0 0 0    Fall Risk  02/03/2016 10/26/2015 01/22/2014 09/17/2013  Falls in the past year? No Yes No Yes  Number falls in past yr: - 1 - 2 or more  Injury with Fall? - No - -  Risk Factor Category  - - - High Fall Risk  Risk for fall due to : - - - Impaired balance/gait   Assessment:   (1) reviewed [facility] transition of care program with patient and wife. Reviewed current consent and patient declines any changes needed. Reviewed new patient pack and wife states they have it.  Reviewed 24 hour nurse line and provided my contact information. (2) no advanced directives. Review importance with patient and wife.  (3) recent UTI. Reports no sign and symptoms of current UTI.  Has a suprapubic catheter. (4) Pain and redness to buttocks. (5) weakness.  Active with home health and home exercise program.  Plan:  (1)  confirmed consent form in chart.  (2) Provided Advanced directive packet and encouraged patient to complete. (3) reviewed with patient the early signs of a UTI, change in urine color or smell. Cloudiness in urine, back pain, weakness, fever and confusion.  Encouraged patient to communicate with urologist the concerns for worsening UTI and seek some prevention suggestions from MD.  Patient confirmed his appointment on 02/05/2016. (4) will notify MD. Encouraged patient to keep clean and dry.  Reviewed pressure relieving techniques. Encourage wife to keep an eye on area and call MD for any worsening or changes in condition.  (5) encouraged patient to be active and take frequent rest periods.   Care planning and goal  setting during home visit. Patients primary goal is to avoid readmissions.   This note will be sent to MD. Next out reach planned via phone in 1 week. Encouraged patient and or wife to call sooner if needed.    [facility] CM Care Plan Problem One   Flowsheet Row Most Recent Value  Care Plan Problem One  Recent hospital admission for UTI  Role Documenting the Problem One  Care Management [place] for Problem One  Active  [facility]N Long Term Goal (31-90 days)  Patient will report no readmissions in the next 31 days.  [facility]N Long Term Goal Start Date  02/01/16  Interventions for Problem One Long Term Goal  Home visit competed. Reviewed signs and symptoms of UTI and when to call MD.  Encouraged early recognition of symptoms.  [facility] CM Short Term Goal #1 (0-30 days)  Patient will follow up with primary MD in the next 10 days  [facility] CM Short Term Goal #1 Start Date  02/01/16  Interventions for Short Term Goal #1  Pending appointment on 02/08/2016. Wife to drive patient to appointment.  [facility] CM Short Term Goal #2 (0-30 days)  Patient will report drinking atleast 5-6 cups of water/liquid daily for the next 30 days.  [facility] CM Short Term Goal #2 Start Date  02/01/16  Interventions for Short Term Goal  #2  Reviewed importance of eating and drinking well.  Encouraged patient to observe for signs of fluid overload.  Reviewed heart failure zones.      Tomasa Rand, RN, BSN, CEN [facility] [employer] [phone]

## 2016-02-04 DIAGNOSIS — D696 Thrombocytopenia, unspecified: Secondary | ICD-10-CM | POA: Diagnosis not present

## 2016-02-04 DIAGNOSIS — N390 Urinary tract infection, site not specified: Secondary | ICD-10-CM | POA: Diagnosis not present

## 2016-02-04 DIAGNOSIS — I5022 Chronic systolic (congestive) heart failure: Secondary | ICD-10-CM | POA: Diagnosis not present

## 2016-02-04 DIAGNOSIS — I214 Non-ST elevation (NSTEMI) myocardial infarction: Secondary | ICD-10-CM | POA: Diagnosis not present

## 2016-02-04 DIAGNOSIS — I130 Hypertensive heart and chronic kidney disease with heart failure and stage 1 through stage 4 chronic kidney disease, or unspecified chronic kidney disease: Secondary | ICD-10-CM | POA: Diagnosis not present

## 2016-02-04 DIAGNOSIS — N182 Chronic kidney disease, stage 2 (mild): Secondary | ICD-10-CM | POA: Diagnosis not present

## 2016-02-05 DIAGNOSIS — N359 Urethral stricture, unspecified: Secondary | ICD-10-CM | POA: Diagnosis not present

## 2016-02-08 ENCOUNTER — Ambulatory Visit (INDEPENDENT_AMBULATORY_CARE_PROVIDER_SITE_OTHER): Payer: Medicare Other | Admitting: Internal Medicine

## 2016-02-08 ENCOUNTER — Encounter: Payer: Self-pay | Admitting: Internal Medicine

## 2016-02-08 VITALS — BP 114/74 | HR 51 | Temp 97.4°F | Ht 65.0 in | Wt 150.0 lb

## 2016-02-08 DIAGNOSIS — A4152 Sepsis due to Pseudomonas: Secondary | ICD-10-CM | POA: Diagnosis not present

## 2016-02-08 DIAGNOSIS — N182 Chronic kidney disease, stage 2 (mild): Secondary | ICD-10-CM | POA: Diagnosis not present

## 2016-02-08 DIAGNOSIS — I255 Ischemic cardiomyopathy: Secondary | ICD-10-CM | POA: Diagnosis not present

## 2016-02-08 DIAGNOSIS — R7881 Bacteremia: Secondary | ICD-10-CM

## 2016-02-08 DIAGNOSIS — I10 Essential (primary) hypertension: Secondary | ICD-10-CM | POA: Diagnosis not present

## 2016-02-08 NOTE — Progress Notes (Signed)
Pre visit review using our clinic review tool, if applicable. No additional management support is needed unless otherwise documented below in the visit note. 

## 2016-02-08 NOTE — Patient Instructions (Signed)
Limit your sodium (Salt) intake    It is important that you exercise regularly, at least 20 minutes 3 to 4 times per week.  If you develop chest pain or shortness of breath seek  medical attention.  Take a calcium supplement, plus [phone] units of vitamin D  Return in 6 months for follow-up

## 2016-02-08 NOTE — Progress Notes (Signed)
Subjective:    Patient ID: Ronald [name], male    DOB: 1927-06-09, 82 y.o.   MRN: [id number]  HPI  Admit date: 01/21/2016 Discharge date: 01/29/2016  Discharge Diagnoses:  Active Problems:   Debility   Stage 2 chronic kidney disease   Pain in oral cavity   Oral thrush   Hypoalbuminemia   AKI (acute kidney injury) ([place])   Neutropenia ([place])  82 year old patient who is seen today following a recent hospital discharge.  He was admitted with urosepsis and has completed antibiotic therapy.  Hospital course complicated by acute kidney injury in the setting of chronic stage II kidney disease.  Since his discharge.  She remains weak, but seems to be improving.  He required treatment for oral candidiasis  Hospital records reviewed  Past Medical History:  Diagnosis Date  . Arthritis    "joints ache" (01/02/2014)  . At risk for sleep apnea    STOP-BANG= 4    SENT TO PCP 12-23-2013  . Bladder calculi   . CHF (congestive heart failure) ([place])   . Chronic cystitis   . Coronary artery disease CARDIOLOGIST-  DR Grace Bushy  [place] -- S/P  11/89 CABG x6 (70% circ; 90% PD; 60-70% distal left main; 30% left circ; 90% 1st diag;, 2nd diag and 3rd diag. w/90%,  mild stenosis LAD and 60-70% pLAD)  Re-do CABG x5 in 1997  . Diverticulosis   . GERD (gastroesophageal reflux disease)   . History of Bell's palsy    RIGHT SIDE-- NO RESIDUAL  . Hx of dizziness   . Hyperlipidemia   . Hypertension    "not anymore" (01/02/2014)  . Ischemic cardiomyopathy    ef 35-40% per cath 08-28-2013  . Kidney stones "years ago"   "passed them"  . Melanoma of ear ([place])    "right"  . Mild dementia   . Myocardial infarction 1986; 1997  . Nocturia   . OAB (overactive bladder)   . Prostate cancer ([place]) 1998   S/P  [place]; [place]  . S/P CABG (coronary artery bypass graft)    [place]  . Skipped heart beats    occasional  . Urethral stricture   . UTI (urinary tract  infection) 09/2015  . Wears hearing aid    bilateral  . Wears partial dentures      Social History   Social History  . Marital status: Married    Spouse name: N/A  . Number of children: N/A  . Years of education: N/A   Occupational History  . Not on file.   Social History Main Topics  . Smoking status: Former Smoker    Years: 40.00    Types: Cigars    Quit date: 12/24/1983  . Smokeless tobacco: Former Systems developer    Types: Chew    Quit date: 08/28/2013  . Alcohol use No  . Drug use: No  . Sexual activity: Not on file   Other Topics Concern  . Not on file   Social History Narrative  . No narrative on file    Past Surgical History:  Procedure Laterality Date  . CARDIAC CATHETERIZATION  02-22-2005  dr Vidal Schwalbe   mild to moderate lv dysfunction with inferobasilar akinesis/  totally occluded SVG to Intermediate Diagonal and SVG to PDA and RCA branches, totally occluded native coronary circulation with diffuse disease pLAD diagonal system with potentially could be ischemic/  patent SVG to OM with  collaterals to dRCA and patent LIMA to LAD and diagonal systemss  . CARDIAC CATHETERIZATION  08-28-2013  DR Daneen Schick   widely patent sequential left internal graft to the diagonal/LAD, widely patent SVG to OM with proximal 50% narrowing noted in the graft, total occlusion SVG's to RCA, RI, and the Diagonal/ LV dysfunction with inferobasal aneurysm and mid anterior wall region of akinesis/  overall EF 35-40%/  Total occlusion of the navtive circulation/  no significant change compared to 2006 cath  . CARDIOVASCULAR STRESS TEST  08-01-2011  dr Angelena Form   inferior scar and possible soft tissue attenuation with minimal peri-infarct ischemia, small region of anterior ischemia and scar/  LVEF 53% LV wall motion with inferior hypokinesis/ no significant change from scan july 2011  . CATARACT EXTRACTION W/ INTRAOCULAR LENS  IMPLANT, BILATERAL Bilateral 2007  . CORONARY ARTERY BYPASS GRAFT  11/ [place]-- 6 vessel/  1997 Re-do 5 vessel  . CYSTOSCOPY WITH URETHRAL DILATATION N/A 12/25/2013   Procedure: CYSTOSCOPY WITH URETHRAL DILATATION, WITH BIOPSY;  Surgeon: Bernestine Amass, MD;  [facility];  Service: Urology;  Laterality: N/A;  . CYSTOSCOPY WITH URETHRAL DILATATION N/A 12/22/2014   Procedure: CYSTOSCOPY WITH URETHRAL DILATATION;  Surgeon: Rana Snare, MD;  Location: [facility];  Service: Urology;  Laterality: N/A;  BALLOON DILATION CATHETER    . EUS  10/05/2011   Procedure: ESOPHAGEAL ENDOSCOPIC ULTRASOUND (EUS) RADIAL;  Surgeon: Arta Silence, MD;  Location: [facility];  Service: Endoscopy;  Laterality: N/A;  . INSERTION OF SUPRAPUBIC CATHETER N/A 12/22/2014   Procedure: INSERTION OF SUPRAPUBIC CATHETER ;  Surgeon: Rana Snare, MD;  Location: [facility];  Service: Urology;  Laterality: N/A;  . LAPAROSCOPIC CHOLECYSTECTOMY  12-23-2005  . LEFT HEART CATHETERIZATION WITH CORONARY/GRAFT ANGIOGRAM N/A 08/28/2013   Procedure: LEFT HEART CATHETERIZATION WITH Beatrix Fetters;  Surgeon: Sinclair Grooms, MD;  Location: [facility] C[facility] LAB;  Service: Cardiovascular;  Laterality: N/A;  . MELANOMA EXCISION  X 1   "ear"  . RADIOACTIVE PROSTATE SEED IMPLANTS  1998  . SKIN CANCER EXCISION  X 2   "top of head"  . TONSILLECTOMY AND ADENOIDECTOMY  1954    Family History  Problem Relation Age of Onset  . Heart disease Mother   . Diabetes Father   . Heart disease Brother     Allergies  Allergen Reactions  . Pantoprazole Other (See Comments)    Headache and lightheaded  . Nitrofurantoin Hives  . Sulfa Antibiotics Hives and Itching  . Lidocaine     Mouth swelling     Current Outpatient Prescriptions on File Prior to Visit  Medication Sig Dispense Refill  . acetaminophen (TYLENOL) 500 MG tablet Take 500 mg by mouth every 6 (six) hours as needed for pain or fever.     Marland Kitchen aspirin EC 81 MG EC tablet Take 1 tablet (81 mg total) by mouth daily. 30 tablet 0  .  atorvastatin (LIPITOR) 80 MG tablet Take 1 tablet (80 mg total) by mouth every evening. (Patient taking differently: Take 40 mg by mouth every evening. ) 30 tablet 1  . Cholecalciferol (VITAMIN D) 2000 UNITS tablet Take 2,000 Units by mouth daily.    . flavoxATE (URISPAS) 100 MG tablet Take 100 mg by mouth 3 (three) times daily as needed for bladder spasms.    Marland Kitchen galantamine (RAZADYNE ER) 8 MG 24 hr capsule Take 8 mg by mouth daily with breakfast.    .  hydroxypropyl methylcellulose (ISOPTO TEARS) 2.5 % ophthalmic solution Place 1 drop into both eyes 2 (two) times daily.     . Multiple Vitamin (MULTIVITAMIN WITH MINERALS) TABS Take 1 tablet by mouth daily.    . nitroGLYCERIN (NITROSTAT) 0.4 MG SL tablet Place 0.4 mg under the tongue every 5 (five) minutes as needed for chest pain.     . simethicone (MYLICON) 40 RC/1.6LA drops Take 1.2 mLs (80 mg total) by mouth every 6 (six) hours as needed for flatulence. 30 mL 0   No current facility-administered medications on file prior to visit.     BP 114/74 (BP Location: Left Arm, Patient Position: Sitting, Cuff Size: Normal)   Pulse (!) 51   Temp 97.4 F (36.3 C) (Oral)   Ht 5\' 5"  (1.651 m)   Wt 150 lb (68 kg)   SpO2 98%   BMI 24.96 kg/m     Review of Systems  Constitutional: Positive for activity change, appetite change and fatigue. Negative for chills and fever.  HENT: Negative for congestion, dental problem, ear pain, hearing loss, sore throat, tinnitus, trouble swallowing and voice change.   Eyes: Negative for pain, discharge and visual disturbance.  Respiratory: Negative for cough, chest tightness, wheezing and stridor.   Cardiovascular: Negative for chest pain, palpitations and leg swelling.  Gastrointestinal: Negative for abdominal distention, abdominal pain, blood in stool, constipation, diarrhea, nausea and vomiting.  Genitourinary: Negative for difficulty urinating, discharge, flank pain, genital sores, hematuria and urgency.    Musculoskeletal: Positive for gait problem. Negative for arthralgias, back pain, joint swelling, myalgias and neck stiffness.       Walks with a cane  Skin: Negative for rash.  Neurological: Positive for weakness. Negative for dizziness, syncope, speech difficulty, numbness and headaches.  Hematological: Negative for adenopathy. Does not bruise/bleed easily.  Psychiatric/Behavioral: Negative for behavioral problems and dysphoric mood. The patient is not nervous/anxious.        Objective:   Physical Exam  Constitutional: He is oriented to person, place, and time. He appears well-developed. No distress.  Alert Slightly weak Blood pressure low normal Pulse 60 O2 saturation 98  HENT:  Head: Normocephalic.  Right Ear: External ear normal.  Left Ear: External ear normal.  Eyes: Conjunctivae and EOM are normal.  Neck: Normal range of motion.  Cardiovascular: Normal rate, regular rhythm and normal heart sounds.   Pulmonary/Chest: Breath sounds normal.  Abdominal: Bowel sounds are normal.  Musculoskeletal: Normal range of motion. He exhibits no edema or tenderness.  Neurological: He is alert and oriented to person, place, and time.  Psychiatric: He has a normal mood and affect. His behavior is normal.          Assessment & Plan:   Status post urosepsis Deconditioning, improving Essential hypertension, stable Acute on chronic kidney disease Oral thrush, resolved History recurrent UTI, history of bladder and renal calculi as well as urethral stricture Dyslipidemia.  Continue statin therapy Coronary artery disease, stable  [id number],PETER [name]

## 2016-02-09 DIAGNOSIS — N182 Chronic kidney disease, stage 2 (mild): Secondary | ICD-10-CM | POA: Diagnosis not present

## 2016-02-09 DIAGNOSIS — I130 Hypertensive heart and chronic kidney disease with heart failure and stage 1 through stage 4 chronic kidney disease, or unspecified chronic kidney disease: Secondary | ICD-10-CM | POA: Diagnosis not present

## 2016-02-09 DIAGNOSIS — N390 Urinary tract infection, site not specified: Secondary | ICD-10-CM | POA: Diagnosis not present

## 2016-02-09 DIAGNOSIS — I5022 Chronic systolic (congestive) heart failure: Secondary | ICD-10-CM | POA: Diagnosis not present

## 2016-02-09 DIAGNOSIS — D696 Thrombocytopenia, unspecified: Secondary | ICD-10-CM | POA: Diagnosis not present

## 2016-02-09 DIAGNOSIS — I214 Non-ST elevation (NSTEMI) myocardial infarction: Secondary | ICD-10-CM | POA: Diagnosis not present

## 2016-02-10 ENCOUNTER — Other Ambulatory Visit: Payer: Self-pay

## 2016-02-10 DIAGNOSIS — I5022 Chronic systolic (congestive) heart failure: Secondary | ICD-10-CM | POA: Diagnosis not present

## 2016-02-10 DIAGNOSIS — I130 Hypertensive heart and chronic kidney disease with heart failure and stage 1 through stage 4 chronic kidney disease, or unspecified chronic kidney disease: Secondary | ICD-10-CM | POA: Diagnosis not present

## 2016-02-10 DIAGNOSIS — N390 Urinary tract infection, site not specified: Secondary | ICD-10-CM | POA: Diagnosis not present

## 2016-02-10 DIAGNOSIS — D696 Thrombocytopenia, unspecified: Secondary | ICD-10-CM | POA: Diagnosis not present

## 2016-02-10 DIAGNOSIS — N182 Chronic kidney disease, stage 2 (mild): Secondary | ICD-10-CM | POA: Diagnosis not present

## 2016-02-10 DIAGNOSIS — I214 Non-ST elevation (NSTEMI) myocardial infarction: Secondary | ICD-10-CM | POA: Diagnosis not present

## 2016-02-10 NOTE — Patient Outreach (Addendum)
Transition of care call: Placed call to patient and spoke with wife [name]. She reports patient is doing well. States that he had his suprapubic catheter changed last week and saw his primary care doctor this week.  Wife reports that patient continues to improve. Denies any new problems or concerns today.  PLAN: Will continue weekly outreach via phone. Encouraged patient and or wife to call me if needed.  Tomasa Rand, RN, BSN, CEN [facility] [employer] [phone]

## 2016-02-11 DIAGNOSIS — N182 Chronic kidney disease, stage 2 (mild): Secondary | ICD-10-CM | POA: Diagnosis not present

## 2016-02-11 DIAGNOSIS — I5022 Chronic systolic (congestive) heart failure: Secondary | ICD-10-CM | POA: Diagnosis not present

## 2016-02-11 DIAGNOSIS — I130 Hypertensive heart and chronic kidney disease with heart failure and stage 1 through stage 4 chronic kidney disease, or unspecified chronic kidney disease: Secondary | ICD-10-CM | POA: Diagnosis not present

## 2016-02-11 DIAGNOSIS — N390 Urinary tract infection, site not specified: Secondary | ICD-10-CM | POA: Diagnosis not present

## 2016-02-11 DIAGNOSIS — I214 Non-ST elevation (NSTEMI) myocardial infarction: Secondary | ICD-10-CM | POA: Diagnosis not present

## 2016-02-11 DIAGNOSIS — D696 Thrombocytopenia, unspecified: Secondary | ICD-10-CM | POA: Diagnosis not present

## 2016-02-15 ENCOUNTER — Other Ambulatory Visit: Payer: Self-pay

## 2016-02-15 DIAGNOSIS — N390 Urinary tract infection, site not specified: Secondary | ICD-10-CM | POA: Diagnosis not present

## 2016-02-15 DIAGNOSIS — D696 Thrombocytopenia, unspecified: Secondary | ICD-10-CM | POA: Diagnosis not present

## 2016-02-15 DIAGNOSIS — I214 Non-ST elevation (NSTEMI) myocardial infarction: Secondary | ICD-10-CM | POA: Diagnosis not present

## 2016-02-15 DIAGNOSIS — I130 Hypertensive heart and chronic kidney disease with heart failure and stage 1 through stage 4 chronic kidney disease, or unspecified chronic kidney disease: Secondary | ICD-10-CM | POA: Diagnosis not present

## 2016-02-15 DIAGNOSIS — I5022 Chronic systolic (congestive) heart failure: Secondary | ICD-10-CM | POA: Diagnosis not present

## 2016-02-15 DIAGNOSIS — N182 Chronic kidney disease, stage 2 (mild): Secondary | ICD-10-CM | POA: Diagnosis not present

## 2016-02-15 NOTE — Patient Outreach (Signed)
Transition of care call: Placed call to patient for transition of care. No answer.Left a message requesting a call back.  PLAN: Will continue to outreach patient weekly. Tomasa Rand, RN, BSN, CEN [facility] [employer] [phone]

## 2016-02-16 ENCOUNTER — Other Ambulatory Visit: Payer: Self-pay

## 2016-02-16 DIAGNOSIS — D696 Thrombocytopenia, unspecified: Secondary | ICD-10-CM | POA: Diagnosis not present

## 2016-02-16 DIAGNOSIS — N390 Urinary tract infection, site not specified: Secondary | ICD-10-CM | POA: Diagnosis not present

## 2016-02-16 DIAGNOSIS — I214 Non-ST elevation (NSTEMI) myocardial infarction: Secondary | ICD-10-CM | POA: Diagnosis not present

## 2016-02-16 DIAGNOSIS — N182 Chronic kidney disease, stage 2 (mild): Secondary | ICD-10-CM | POA: Diagnosis not present

## 2016-02-16 DIAGNOSIS — I5022 Chronic systolic (congestive) heart failure: Secondary | ICD-10-CM | POA: Diagnosis not present

## 2016-02-16 DIAGNOSIS — I130 Hypertensive heart and chronic kidney disease with heart failure and stage 1 through stage 4 chronic kidney disease, or unspecified chronic kidney disease: Secondary | ICD-10-CM | POA: Diagnosis not present

## 2016-02-16 NOTE — Patient Outreach (Signed)
Transition of care: Wife called back and reports that patient is doing well. Patient then got on the phone and reports that he is getting stronger. Denies any problems or concerns today.  PLAN: reviewed with patient that I will call him next week to follow up and to call sooner if needed.  Tomasa Rand, RN, BSN, CEN [facility] [employer] [phone]

## 2016-02-19 DIAGNOSIS — I5022 Chronic systolic (congestive) heart failure: Secondary | ICD-10-CM | POA: Diagnosis not present

## 2016-02-19 DIAGNOSIS — N390 Urinary tract infection, site not specified: Secondary | ICD-10-CM | POA: Diagnosis not present

## 2016-02-19 DIAGNOSIS — I214 Non-ST elevation (NSTEMI) myocardial infarction: Secondary | ICD-10-CM | POA: Diagnosis not present

## 2016-02-19 DIAGNOSIS — N182 Chronic kidney disease, stage 2 (mild): Secondary | ICD-10-CM | POA: Diagnosis not present

## 2016-02-19 DIAGNOSIS — I130 Hypertensive heart and chronic kidney disease with heart failure and stage 1 through stage 4 chronic kidney disease, or unspecified chronic kidney disease: Secondary | ICD-10-CM | POA: Diagnosis not present

## 2016-02-19 DIAGNOSIS — D696 Thrombocytopenia, unspecified: Secondary | ICD-10-CM | POA: Diagnosis not present

## 2016-02-22 ENCOUNTER — Other Ambulatory Visit: Payer: Self-pay | Admitting: Internal Medicine

## 2016-02-22 ENCOUNTER — Telehealth: Payer: Self-pay | Admitting: Internal Medicine

## 2016-02-22 DIAGNOSIS — N390 Urinary tract infection, site not specified: Secondary | ICD-10-CM | POA: Diagnosis not present

## 2016-02-22 DIAGNOSIS — N182 Chronic kidney disease, stage 2 (mild): Secondary | ICD-10-CM | POA: Diagnosis not present

## 2016-02-22 DIAGNOSIS — I214 Non-ST elevation (NSTEMI) myocardial infarction: Secondary | ICD-10-CM | POA: Diagnosis not present

## 2016-02-22 DIAGNOSIS — D696 Thrombocytopenia, unspecified: Secondary | ICD-10-CM | POA: Diagnosis not present

## 2016-02-22 DIAGNOSIS — I5022 Chronic systolic (congestive) heart failure: Secondary | ICD-10-CM | POA: Diagnosis not present

## 2016-02-22 DIAGNOSIS — I130 Hypertensive heart and chronic kidney disease with heart failure and stage 1 through stage 4 chronic kidney disease, or unspecified chronic kidney disease: Secondary | ICD-10-CM | POA: Diagnosis not present

## 2016-02-22 MED ORDER — NYSTATIN 100000 UNIT/ML MT SUSP: 5.0000 mL | Freq: Four times a day (QID) | OROMUCOSAL | 0 refills | Status: DC

## 2016-02-22 NOTE — Telephone Encounter (Signed)
Spoke to pt, told him Rx was sent to pharmacy Nystatin for mouth sores. Pt verbalized understanding.

## 2016-02-22 NOTE — Telephone Encounter (Signed)
Please notify patient that a prescription has been called into his drugstore

## 2016-02-22 NOTE — Telephone Encounter (Signed)
Please see message and advise 

## 2016-02-22 NOTE — Telephone Encounter (Addendum)
Pt  fell in drive way and has 2 abrasions on his right knee. Pt has blisters on lips and white bumps on tongue and hard palate. [facility]

## 2016-02-23 ENCOUNTER — Other Ambulatory Visit: Payer: Self-pay

## 2016-02-23 DIAGNOSIS — I5022 Chronic systolic (congestive) heart failure: Secondary | ICD-10-CM | POA: Diagnosis not present

## 2016-02-23 DIAGNOSIS — I214 Non-ST elevation (NSTEMI) myocardial infarction: Secondary | ICD-10-CM | POA: Diagnosis not present

## 2016-02-23 DIAGNOSIS — I130 Hypertensive heart and chronic kidney disease with heart failure and stage 1 through stage 4 chronic kidney disease, or unspecified chronic kidney disease: Secondary | ICD-10-CM | POA: Diagnosis not present

## 2016-02-23 DIAGNOSIS — N390 Urinary tract infection, site not specified: Secondary | ICD-10-CM | POA: Diagnosis not present

## 2016-02-23 DIAGNOSIS — N182 Chronic kidney disease, stage 2 (mild): Secondary | ICD-10-CM | POA: Diagnosis not present

## 2016-02-23 DIAGNOSIS — D696 Thrombocytopenia, unspecified: Secondary | ICD-10-CM | POA: Diagnosis not present

## 2016-02-23 NOTE — Patient Outreach (Signed)
Transition of care call: Placed call to patient- wife answered. She reports that patient is doing well. States that he has "yeast" in his mouth and MD called in RX yesterday and patient started the Nystatin today.  Wife reports that patient is being active and doing well. Reports good appetite.  Denies any urinary symptoms at this time.  PLAN: Will continue weekly transition of care calls. Encouraged wife to call sooner if needed. Tomasa Rand, RN, BSN, CEN [facility] [employer] [phone]

## 2016-02-25 DIAGNOSIS — N182 Chronic kidney disease, stage 2 (mild): Secondary | ICD-10-CM | POA: Diagnosis not present

## 2016-02-25 DIAGNOSIS — I130 Hypertensive heart and chronic kidney disease with heart failure and stage 1 through stage 4 chronic kidney disease, or unspecified chronic kidney disease: Secondary | ICD-10-CM | POA: Diagnosis not present

## 2016-02-25 DIAGNOSIS — N390 Urinary tract infection, site not specified: Secondary | ICD-10-CM | POA: Diagnosis not present

## 2016-02-25 DIAGNOSIS — I214 Non-ST elevation (NSTEMI) myocardial infarction: Secondary | ICD-10-CM | POA: Diagnosis not present

## 2016-02-25 DIAGNOSIS — I5022 Chronic systolic (congestive) heart failure: Secondary | ICD-10-CM | POA: Diagnosis not present

## 2016-02-25 DIAGNOSIS — D696 Thrombocytopenia, unspecified: Secondary | ICD-10-CM | POA: Diagnosis not present

## 2016-02-26 DIAGNOSIS — N3020 Other chronic cystitis without hematuria: Secondary | ICD-10-CM | POA: Diagnosis not present

## 2016-03-02 DIAGNOSIS — N182 Chronic kidney disease, stage 2 (mild): Secondary | ICD-10-CM | POA: Diagnosis not present

## 2016-03-02 DIAGNOSIS — D696 Thrombocytopenia, unspecified: Secondary | ICD-10-CM | POA: Diagnosis not present

## 2016-03-02 DIAGNOSIS — I130 Hypertensive heart and chronic kidney disease with heart failure and stage 1 through stage 4 chronic kidney disease, or unspecified chronic kidney disease: Secondary | ICD-10-CM | POA: Diagnosis not present

## 2016-03-02 DIAGNOSIS — N390 Urinary tract infection, site not specified: Secondary | ICD-10-CM | POA: Diagnosis not present

## 2016-03-02 DIAGNOSIS — I5022 Chronic systolic (congestive) heart failure: Secondary | ICD-10-CM | POA: Diagnosis not present

## 2016-03-02 DIAGNOSIS — I214 Non-ST elevation (NSTEMI) myocardial infarction: Secondary | ICD-10-CM | POA: Diagnosis not present

## 2016-03-03 ENCOUNTER — Other Ambulatory Visit: Payer: Self-pay

## 2016-03-03 NOTE — Patient Outreach (Signed)
Transition of care/case closure: Placed call for final transition of care . Wife reports that patient is doing really well. Reports that he is slowly getting his strength back. Reports urology is now changing catheter monthly vs every 6 weeks in hopes that this will help reduce UTI frequency.   Wife denies any new problems or concerns today. Reviewed with wife that patient has completed transition of care program.  Wife in agreement for case closure and will call me in future for any concerns.  PLAN: Will close case as goals are met.  Will notify MD. Will send patient case closure letter. Will notify case management assistant of case closure.   [facility] CM Care Plan Problem One   Flowsheet Row Most Recent Value  Care Plan Problem One  Recent hospital admission for UTI  Role Documenting the Problem One  Care Management [place] for Problem One  Active  [facility] Long Term Goal (31-90 days)  Patient will report no readmissions in the next 31 days.  [facility]N Long Term Goal Start Date  02/01/16  [facility] Long Term Goal Met Date  03/03/16  Interventions for Problem One Long Term Goal  Reviewed importance of calling MD for changes in condition. WIill conitinue to outreach weekly.   [facility] CM Short Term Goal #1 (0-30 days)  Patient will follow up with primary MD in the next 10 days  [facility] CM Short Term Goal #1 Start Date  02/01/16  [facility] CM Short Term Goal #1 Met Date  02/10/16  Interventions for Short Term Goal #1  Pending appointment on 02/08/2016. Wife to drive patient to appointment.  [facility] CM Short Term Goal #2 (0-30 days)  Patient will report drinking atleast 5-6 cups of water/liquid daily for the next 30 days.  [facility] CM Short Term Goal #2 Start Date  02/01/16  [facility] CM Short Term Goal #2 Met Date  03/03/16  Interventions for Short Term Goal #2  Encouraged patient to eat and drink well.      Tomasa Rand, RN, BSN, CEN [facility] [employer] [phone]

## 2016-03-04 DIAGNOSIS — N359 Urethral stricture, unspecified: Secondary | ICD-10-CM | POA: Diagnosis not present

## 2016-03-09 DIAGNOSIS — D696 Thrombocytopenia, unspecified: Secondary | ICD-10-CM | POA: Diagnosis not present

## 2016-03-09 DIAGNOSIS — I214 Non-ST elevation (NSTEMI) myocardial infarction: Secondary | ICD-10-CM | POA: Diagnosis not present

## 2016-03-09 DIAGNOSIS — N182 Chronic kidney disease, stage 2 (mild): Secondary | ICD-10-CM | POA: Diagnosis not present

## 2016-03-09 DIAGNOSIS — I130 Hypertensive heart and chronic kidney disease with heart failure and stage 1 through stage 4 chronic kidney disease, or unspecified chronic kidney disease: Secondary | ICD-10-CM | POA: Diagnosis not present

## 2016-03-09 DIAGNOSIS — N390 Urinary tract infection, site not specified: Secondary | ICD-10-CM | POA: Diagnosis not present

## 2016-03-09 DIAGNOSIS — I5022 Chronic systolic (congestive) heart failure: Secondary | ICD-10-CM | POA: Diagnosis not present

## 2016-03-16 DIAGNOSIS — N390 Urinary tract infection, site not specified: Secondary | ICD-10-CM | POA: Diagnosis not present

## 2016-03-16 DIAGNOSIS — D696 Thrombocytopenia, unspecified: Secondary | ICD-10-CM | POA: Diagnosis not present

## 2016-03-16 DIAGNOSIS — N182 Chronic kidney disease, stage 2 (mild): Secondary | ICD-10-CM | POA: Diagnosis not present

## 2016-03-16 DIAGNOSIS — I5022 Chronic systolic (congestive) heart failure: Secondary | ICD-10-CM | POA: Diagnosis not present

## 2016-03-16 DIAGNOSIS — I130 Hypertensive heart and chronic kidney disease with heart failure and stage 1 through stage 4 chronic kidney disease, or unspecified chronic kidney disease: Secondary | ICD-10-CM | POA: Diagnosis not present

## 2016-03-16 DIAGNOSIS — I214 Non-ST elevation (NSTEMI) myocardial infarction: Secondary | ICD-10-CM | POA: Diagnosis not present

## 2016-03-26 DIAGNOSIS — N182 Chronic kidney disease, stage 2 (mild): Secondary | ICD-10-CM | POA: Diagnosis not present

## 2016-03-26 DIAGNOSIS — I130 Hypertensive heart and chronic kidney disease with heart failure and stage 1 through stage 4 chronic kidney disease, or unspecified chronic kidney disease: Secondary | ICD-10-CM | POA: Diagnosis not present

## 2016-03-26 DIAGNOSIS — I214 Non-ST elevation (NSTEMI) myocardial infarction: Secondary | ICD-10-CM | POA: Diagnosis not present

## 2016-03-26 DIAGNOSIS — I5022 Chronic systolic (congestive) heart failure: Secondary | ICD-10-CM | POA: Diagnosis not present

## 2016-03-26 DIAGNOSIS — N390 Urinary tract infection, site not specified: Secondary | ICD-10-CM | POA: Diagnosis not present

## 2016-03-26 DIAGNOSIS — D696 Thrombocytopenia, unspecified: Secondary | ICD-10-CM | POA: Diagnosis not present

## 2016-04-01 DIAGNOSIS — N359 Urethral stricture, unspecified: Secondary | ICD-10-CM | POA: Diagnosis not present

## 2016-05-03 DIAGNOSIS — N359 Urethral stricture, unspecified: Secondary | ICD-10-CM | POA: Diagnosis not present

## 2016-05-17 NOTE — Progress Notes (Signed)
Chief Complaint  Patient presents with  . Coronary Artery Disease    NO CP.  Marland Kitchen Shortness of Breath    WITH EXERTION  . Edema    IN ANKLES. NOTHING OUT OF THE ORDINARY.    History of Present Illness: 83 yo male with history of CAD s/p CABG in 1989 and redo bypass in 1997 per Dr. Redmond Pulling, nephrolithiasis, HTN, HLD, mild dementia, prostate cancer here today for cardiac follow up. He has been followed in the past by Dr. Doreatha Lew. Leane Call May 2013 because of dyspnea and fatigue. This showed inferior scar with soft tissue attenuation and small region of anterior scar with possible small area of ischemia. This was felt to be low risk. He was seen in November 2014 and c/o fatigue and was hypotensive. Lisinopril was stopped and this resolved. Admitted to [facility] June 2015 with chest pain. Cardiac cath 08/28/13 with 95% left main stenosis, occluded LAD, occluded Circumflex, occluded RCA. Patent LIMA to LAD, patent SVG to OM supplying collaterals to RCA. Occluded SVG to RCA and SVG to intermediate branch. Medical therapy recommended. LVEF=35-40% by LVgram. His pain resolved with addition of Zantac, felt to be GI related. He has had several admissions in 2015-2017 with urosepsis with known history of kidney stones and urethral stricture. Echo October 2017 with LVEF=40-45%, mild MR.   He is here today for follow up. He has no exertional chest pain or pressure. No SOB or  dizziness. No LE edema. Weight is stable.   Primary Care Physician: Nyoka Cowden, MD   Past Medical History:  Diagnosis Date  . Arthritis    "joints ache" (01/02/2014)  . At risk for sleep apnea    STOP-BANG= 4    SENT TO PCP 12-23-2013  . Bladder calculi   . CHF (congestive heart failure) ([place])   . Chronic cystitis   . Coronary artery disease CARDIOLOGIST-  DR Grace Bushy  MI -- S/P  11/89 CABG x6 (70% circ; 90% PD; 60-70% distal left main; 30% left circ; 90% 1st diag;, 2nd diag and 3rd diag. w/90%,  mild stenosis  LAD and 60-70% pLAD)  Re-do CABG x5 in 1997  . Diverticulosis   . GERD (gastroesophageal reflux disease)   . History of Bell's palsy    RIGHT SIDE-- NO RESIDUAL  . Hx of dizziness   . Hyperlipidemia   . Hypertension    "not anymore" (01/02/2014)  . Ischemic cardiomyopathy    ef 35-40% per cath 08-28-2013  . Kidney stones "years ago"   "passed them"  . Melanoma of ear ([place])    "right"  . Mild dementia   . Myocardial infarction 1986; 1997  . Nocturia   . OAB (overactive bladder)   . Prostate cancer ([place]) 1998   S/P  [place]; [employer]  . S/P CABG (coronary artery bypass graft)    [place]  . Skipped heart beats    occasional  . Urethral stricture   . UTI (urinary tract infection) 09/2015  . Wears hearing aid    bilateral  . Wears partial dentures     Past Surgical History:  Procedure Laterality Date  . CARDIAC CATHETERIZATION  02-22-2005  dr Vidal Schwalbe   mild to moderate lv dysfunction with inferobasilar akinesis/  totally occluded SVG to Intermediate Diagonal and SVG to PDA and RCA branches, totally occluded native coronary circulation with diffuse disease pLAD diagonal system with potentially could be  ischemic/  patent SVG to OM with collaterals to dRCA and patent LIMA to LAD and diagonal systemss  . CARDIAC CATHETERIZATION  08-28-2013  DR Daneen Schick   widely patent sequential left internal graft to the diagonal/LAD, widely patent SVG to OM with proximal 50% narrowing noted in the graft, total occlusion SVG's to RCA, RI, and the Diagonal/ LV dysfunction with inferobasal aneurysm and mid anterior wall region of akinesis/  overall EF 35-40%/  Total occlusion of the navtive circulation/  no significant change compared to 2006 cath  . CARDIOVASCULAR STRESS TEST  08-01-2011  dr Angelena Form   inferior scar and possible soft tissue attenuation with minimal peri-infarct ischemia, small region of anterior ischemia and scar/  LVEF 53% LV wall motion with  inferior hypokinesis/ no significant change from scan july 2011  . CATARACT EXTRACTION W/ INTRAOCULAR LENS  IMPLANT, BILATERAL Bilateral 2007  . CORONARY ARTERY BYPASS GRAFT  11/ [place]-- 6 vessel/  1997 Re-do 5 vessel  . CYSTOSCOPY WITH URETHRAL DILATATION N/A 12/25/2013   Procedure: CYSTOSCOPY WITH URETHRAL DILATATION, WITH BIOPSY;  Surgeon: Bernestine Amass, MD;  Location: [facility];  Service: Urology;  Laterality: N/A;  . CYSTOSCOPY WITH URETHRAL DILATATION N/A 12/22/2014   Procedure: CYSTOSCOPY WITH URETHRAL DILATATION;  Surgeon: Rana Snare, MD;  Location: [facility];  Service: Urology;  Laterality: N/A;  BALLOON DILATION CATHETER    . EUS  10/05/2011   Procedure: ESOPHAGEAL ENDOSCOPIC ULTRASOUND (EUS) RADIAL;  Surgeon: Arta Silence, MD;  Location: [facility];  Service: Endoscopy;  Laterality: N/A;  . INSERTION OF SUPRAPUBIC CATHETER N/A 12/22/2014   Procedure: INSERTION OF SUPRAPUBIC CATHETER ;  Surgeon: Rana Snare, MD;  Location: [facility];  Service: Urology;  Laterality: N/A;  . LAPAROSCOPIC CHOLECYSTECTOMY  12-23-2005  . LEFT HEART CATHETERIZATION WITH CORONARY/GRAFT ANGIOGRAM N/A 08/28/2013   Procedure: LEFT HEART CATHETERIZATION WITH Beatrix Fetters;  Surgeon: Sinclair Grooms, MD;  Location: [facility];  Service: Cardiovascular;  Laterality: N/A;  . MELANOMA EXCISION  X 1   "ear"  . RADIOACTIVE PROSTATE SEED IMPLANTS  1998  . SKIN CANCER EXCISION  X 2   "top of head"  . TONSILLECTOMY AND ADENOIDECTOMY  1954    Current Outpatient Prescriptions  Medication Sig Dispense Refill  . acetaminophen (TYLENOL) 500 MG tablet Take 500 mg by mouth every 6 (six) hours as needed for pain or fever.     Marland Kitchen aspirin EC 81 MG EC tablet Take 1 tablet (81 mg total) by mouth daily. 30 tablet 0  . atorvastatin (LIPITOR) 80 MG tablet Take 1 tablet (80 mg total) by mouth every evening. (Patient taking differently: Take 40 mg by mouth every evening. ) 30 tablet 1    . Cholecalciferol (VITAMIN D) 2000 UNITS tablet Take 2,000 Units by mouth daily.    Marland Kitchen galantamine (RAZADYNE ER) 8 MG 24 hr capsule Take 8 mg by mouth daily with breakfast.    . hydroxypropyl methylcellulose (ISOPTO TEARS) 2.5 % ophthalmic solution Place 1 drop into both eyes 2 (two) times daily.     . Multiple Vitamin (MULTIVITAMIN WITH MINERALS) TABS Take 1 tablet by mouth daily.    . nitroGLYCERIN (NITROSTAT) 0.4 MG SL tablet Place 0.4 mg under the tongue every 5 (five) minutes as needed for chest pain.     . simethicone (MYLICON) 40 GL/8.7FI drops Take 1.2 mLs (80 mg total) by mouth every 6 (six) hours as needed for flatulence. [place]  mL 0  . flavoxATE (URISPAS) 100 MG tablet Take 100 mg by mouth 3 (three) times daily as needed for bladder spasms.    . furosemide (LASIX) 20 MG tablet Take 1 tablet (20 mg total) by mouth daily as needed. For swelling 15 tablet 6  . ranitidine (ZANTAC) 150 MG tablet Take 1 tablet (150 mg total) by mouth 2 (two) times daily.     No current facility-administered medications for this visit.     Allergies  Allergen Reactions  . Pantoprazole Other (See Comments)    Headache and lightheaded  . Nitrofurantoin Hives  . Sulfa Antibiotics Hives and Itching  . Lidocaine     Mouth swelling     Social History   Social History  . Marital status: Married    Spouse name: N/A  . Number of children: N/A  . Years of education: N/A   Occupational History  . Not on file.   Social History Main Topics  . Smoking status: Former Smoker    Years: 40.00    Types: Cigars    Quit date: 12/24/1983  . Smokeless tobacco: Former Systems developer    Types: Chew    Quit date: 08/28/2013  . Alcohol use No  . Drug use: No  . Sexual activity: Not on file   Other Topics Concern  . Not on file   Social History Narrative  . No narrative on file    Family History  Problem Relation Age of Onset  . Heart disease Mother   . Diabetes Father   . Heart disease Brother     Review of  Systems:  As stated in the HPI and otherwise negative.   BP 140/80 (BP Location: Right Arm, Patient Position: Sitting, Cuff Size: Normal)   Pulse 64   Ht 5\' 5"  (1.651 m)   Wt 160 lb 12.8 oz (72.9 kg)   SpO2 98%   BMI 26.76 kg/m   Physical Examination: General: Well developed, well nourished, NAD  HEENT: OP clear, mucus membranes moist  SKIN: warm, dry. No rashes. Neuro: No focal deficits  Musculoskeletal: Muscle strength 5/5 all ext  Psychiatric: Mood and affect normal  Neck: No JVD, no carotid bruits, no thyromegaly, no lymphadenopathy.  Lungs:Clear bilaterally, no wheezes, rhonci, crackles Cardiovascular: Regular rate and rhythm. No murmurs, gallops or rubs. Abdomen:Soft. Bowel sounds present. Non-tender.  Extremities: No lower extremity edema. Pulses are 2 + in the bilateral DP/PT.  Cardiac cath 08/28/13: The left main coronary artery is is severely involved distally with a string-like obstruction of at least 95%. This vessel is heavily calcified..  The left anterior descending artery is totally occluded..  The left circumflex artery is totally occluded.  The right coronary artery is totally occluded proximally. Right to right homocollaterals and noted to feel a right ventricular branch.Marland Kitchen  BYPASS GRAFT ANGIOGRAPHY: The saphenous vein graft to the obtuse marginal is widely patent there is proximal segmental 30-50% narrowing. The obtuse marginal territory supplies collaterals to the distal right coronary.  The saphenous vein graft to the right coronary is totally occluded  The saphenous vein graft to the ramus/diagonal is totally occluded  The left internal mammary graft to the LAD and diagonal is widely patent  LEFT VENTRICULOGRAM: Left ventricular angiogram was done in the 30 RAO projection and revealed a well formed inferoposterior aneurysm. The LV cavity is dilated. A focal portion of the mid anterior wall is akinetic. Overall LV function reveals an EF of 35-40%. No obvious mitral  regurgitation is  noted although power injection was not performed and mild regurgitation could be missed.  IMPRESSIONS: 1. Widely patent sequential left internal graft to the diagonal/LAD.  2. Widely patent saphenous vein graft to the obtuse marginal with proximal 50% narrowing noted in the graft.  3. Total occlusion of the saphenous vein grafts to the right coronary, the ramus intermedius, and the diagonal.  4. Left ventricular dysfunction with inferobasal aneurysm and mid anterior wall region of akinesis. Overall EF is 35-40% .  5. Total occlusion of the native circulation.  Echo 01/15/16: Procedure narrative: Transthoracic echocardiography. Image   quality was poor. The study was technically difficult, as a   result of poor patient compliance and body habitus. Intravenous   contrast (Definity) was administered. - Left ventricle: The cavity size was normal. Wall thickness was   increased in a pattern of severe LVH. Systolic function was   mildly to moderately reduced. The estimated ejection fraction was   in the range of 40% to 45%. Anterior and apical hypokinesis, as   well as basal to mid inferior wall hypokinesis. Diastolic   dysfunciton with indeterminate LV filling pressure. The study is   not technically sufficient to allow evaluation of LV diastolic   function. - Aorta: Aortic root dimension: 41 mm (ED). - Aortic root: The aortic root is dilated. - Mitral valve: Mildly thickened leaflets . There was mild   regurgitation. - Left atrium: The atrium was mildly dilated. - Tricuspid valve: There was mild regurgitation. - Pulmonary arteries: PA peak pressure: 33 mm Hg (S). - Inferior vena cava: The vessel was dilated. The respirophasic   diameter changes were blunted (< 50%), consistent with elevated   central venous pressure.  Impressions:  - Compared to a prior report in 2015, the LVEF is lower at 40-45%   with anterior, apical and basal to mid inferior wall hypokinesis,    the ascending aorta is dilated to 4.1 cm.  EKG:  EKG is not ordered today. The ekg ordered today demonstrates   Recent Labs: 10/14/2015: TSH 1.784 01/14/2016: B Natriuretic Peptide 719.7 01/21/2016: Magnesium 2.0 01/22/2016: ALT 48 01/28/2016: BUN 23; Creatinine, Ser 1.10; Potassium 4.1; Sodium 137 01/29/2016: Hemoglobin 13.0; Platelets 179   Lipid Panel    Component Value Date/Time   CHOL 133 07/23/2015 0755   TRIG 49 07/23/2015 0755   HDL 52 07/23/2015 0755   CHOLHDL 2.6 07/23/2015 0755   VLDL 10 07/23/2015 0755   LDLCALC 71 07/23/2015 0755     Wt Readings from Last 3 Encounters:  05/18/16 160 lb 12.8 oz (72.9 kg)  02/08/16 150 lb (68 kg)  02/03/16 145 lb (65.8 kg)     Other studies Reviewed: Additional studies/ records that were reviewed today include: . Review of the above records demonstrates:    Assessment and Plan:   1. CAD without angina: No recent chest pains suggestive of angina. Cardiac cath June 2015 with stable anatomy. Continue statin, ASA. Imdur was stopped in the hospital for ? reason. No beta blocker secondary to bradycardia. No Ace-inh secondary to hypotension.   2. Hyperlipidemia: Continue statin. Lipids well controlled April 2017.   3. HTN: BP well controlled. No changes today.   4. Ischemic cardiomyopathy: LVEF=40-45% by Echo October 2017. He does not tolerate Ace-inh or beta blockers. Volume status is ok today. He has occasional LE edema. Will give him Lasix 20 mg to use prn.   5. GERD: He uses Zantac prn and has had good control of reflux. He has  been off of it and now having some indigestion. Will restart Zantac now.   Current medicines are reviewed at length with the patient today.  The patient does not have concerns regarding medicines.  The following changes have been made:  no change  Labs/ tests ordered today include:   No orders of the defined types were placed in this encounter.   Disposition:   FU with me in 6  months  Signed, Lauree Chandler, MD 05/18/2016 10:47 AM    [facility] [address], [place], [place]  [address] Phone: [phone]; Fax: [phone]

## 2016-05-18 ENCOUNTER — Encounter (INDEPENDENT_AMBULATORY_CARE_PROVIDER_SITE_OTHER): Payer: Self-pay

## 2016-05-18 ENCOUNTER — Encounter: Payer: Self-pay | Admitting: Cardiovascular Disease

## 2016-05-18 ENCOUNTER — Ambulatory Visit (INDEPENDENT_AMBULATORY_CARE_PROVIDER_SITE_OTHER): Payer: Medicare Other | Admitting: Cardiovascular Disease

## 2016-05-18 VITALS — BP 140/80 | HR 64 | Ht 65.0 in | Wt 160.8 lb

## 2016-05-18 DIAGNOSIS — I10 Essential (primary) hypertension: Secondary | ICD-10-CM | POA: Diagnosis not present

## 2016-05-18 DIAGNOSIS — I25810 Atherosclerosis of coronary artery bypass graft(s) without angina pectoris: Secondary | ICD-10-CM

## 2016-05-18 DIAGNOSIS — E7800 Pure hypercholesterolemia, unspecified: Secondary | ICD-10-CM

## 2016-05-18 DIAGNOSIS — I255 Ischemic cardiomyopathy: Secondary | ICD-10-CM

## 2016-05-18 MED ORDER — FUROSEMIDE 20 MG PO TABS: 20.0000 mg | ORAL_TABLET | Freq: Every day | ORAL | 6 refills | Status: DC | PRN

## 2016-05-18 MED ORDER — RANITIDINE HCL 150 MG PO TABS: 150.0000 mg | ORAL_TABLET | Freq: Two times a day (BID) | ORAL | Status: DC

## 2016-05-18 NOTE — Patient Instructions (Addendum)
Medication Instructions:   Your physician has recommended you make the following change in your medication:  Start furosemide 20 mg by mouth daily as needed for swelling. Start generic Zantac (Ranitidine) 150 mg by mouth twice daily. This is over the counter.  Labwork: none  Testing/Procedures: none  Follow-Up: Your physician recommends that you schedule a follow-up appointment in: 6 months. Please call our office in about 3 months to schedule this appointment.     Any Other Special Instructions Will Be Listed Below (If Applicable).     If you need a refill on your cardiac medications before your next appointment, please call your pharmacy.

## 2016-06-02 DIAGNOSIS — N359 Urethral stricture, unspecified: Secondary | ICD-10-CM | POA: Diagnosis not present

## 2016-06-02 DIAGNOSIS — C61 Malignant neoplasm of prostate: Secondary | ICD-10-CM | POA: Diagnosis not present

## 2016-06-30 DIAGNOSIS — N359 Urethral stricture, unspecified: Secondary | ICD-10-CM | POA: Diagnosis not present

## 2016-07-28 DIAGNOSIS — N359 Urethral stricture, unspecified: Secondary | ICD-10-CM | POA: Diagnosis not present

## 2016-08-25 DIAGNOSIS — N359 Urethral stricture, unspecified: Secondary | ICD-10-CM | POA: Diagnosis not present

## 2016-08-31 DIAGNOSIS — N359 Urethral stricture, unspecified: Secondary | ICD-10-CM | POA: Diagnosis not present

## 2016-08-31 DIAGNOSIS — N3020 Other chronic cystitis without hematuria: Secondary | ICD-10-CM | POA: Diagnosis not present

## 2016-09-22 DIAGNOSIS — N359 Urethral stricture, unspecified: Secondary | ICD-10-CM | POA: Diagnosis not present

## 2016-10-20 DIAGNOSIS — C61 Malignant neoplasm of prostate: Secondary | ICD-10-CM | POA: Diagnosis not present

## 2016-10-20 DIAGNOSIS — N359 Urethral stricture, unspecified: Secondary | ICD-10-CM | POA: Diagnosis not present

## 2016-11-17 DIAGNOSIS — N359 Urethral stricture, unspecified: Secondary | ICD-10-CM | POA: Diagnosis not present

## 2016-11-23 NOTE — Progress Notes (Addendum)
Subjective:   Ronald [name] is a 83 y.o. male who presents for Medicare Annual (Subsequent) preventive examination.  The Patient was informed that the wellness visit is to identify future health risk and educate and initiate measures that can reduce risk for increased disease through the lifespan.    Annual Wellness Assessment  Reports health as Fair Complaining about urinary issues that is under the care of urology.  Preventive Screening -Counseling & Management  Medicare Annual Preventive Care Visit - Subsequent Last OV 01/2016  PSA 2008   IMM  PCV 13 -he will check after which pneumonia shot. Tdap 09/2016 states he will take his teeth At the [facility] Flu vaccine today  PSA 01/2007 Hx heart disease; CAD and CABG 1989 Mild dementia-noted to have mild dementia,mini mental status exam completed,    Describes Health as poor, fair, good or great?  Lives with wife and has walk in shower Wife is 13   Talks about ER admit last year; was treated for sepsis    VS reviewed;   meds reviewed Has stents in bladder; states he has cath in Does take his lasix as needed when he has to much sodium; drinks V8 and has a hamburger every day   Diet  Tries to eat a lot of protein Breakfast; bologna ,cheese and egg; toast V8 juice Lunch  Has a hamburger ; takes the rolls off Supper eats well   BMI 27   Exercise PT taught him exercises Holds on to the counter; does squats Leg lifts; leg kicks; side legs Stand on toes; stand on heels  Then uses 1lb weights on arms     Cardiac Risk Factors Addressed Hyperlipidemia managed  Diabetes 6.1 a1c in 2015  Has had ur issues since prostate cancer treatment    Advanced Directives states he has completed his health care power of attorney  Patient Care Team: Marletta Lor, MD as PCP - General Assessed for additional providers  Immunization History  Administered Date(s) Administered  . Influenza, High Dose Seasonal PF  01/24/2013, 11/24/2016  . Influenza,inj,Quad PF,6+ Mos 01/03/2014, 12/07/2014  . Pneumococcal-Unspecified 07/26/2012  . Td 06/03/1996, 10/26/2006   Required Immunizations needed today  Screening test up to date or reviewed for plan of completion Health Maintenance Due  Topic Date Due  . PNA vac Low Risk Adult (2 of 2 - PCV13) 07/26/2013  . TETANUS/TDAP  10/25/2016  . INFLUENZA VACCINE  10/26/2016    Will find out which pneumonia vaccine he had PCV 13 or PSV 23 Tdap ; [facility]         Objective:     Vitals: BP 120/64   Pulse 72   Ht 5\' 5"  (1.651 m)   Wt 162 lb (73.5 kg)   SpO2 95%   BMI 26.96 kg/m   Body mass index is 26.96 kg/m.   Tobacco History  Smoking Status  . Former Smoker  . Years: 40.00  . Types: Cigars  . Quit date: 12/24/1983  Smokeless Tobacco  . Former Systems developer  . Types: Chew  . Quit date: 08/28/2013    Comment: smoked a pipe     Counseling given: Not Answered   Past Medical History:  Diagnosis Date  . Arthritis    "joints ache" (01/02/2014)  . At risk for sleep apnea    STOP-BANG= 4    SENT TO PCP 12-23-2013  . Bladder calculi   . CHF (congestive heart failure) ([place])   .  Chronic cystitis   . Coronary artery disease CARDIOLOGIST-  DR Grace Bushy  [place] -- S/P  11/89 CABG x6 (70% circ; 90% PD; 60-70% distal left main; 30% left circ; 90% 1st diag;, 2nd diag and 3rd diag. w/90%,  mild stenosis LAD and 60-70% pLAD)  Re-do CABG x5 in 1997  . Diverticulosis   . GERD (gastroesophageal reflux disease)   . History of Bell's palsy    RIGHT SIDE-- NO RESIDUAL  . Hx of dizziness   . Hyperlipidemia   . Hypertension    "not anymore" (01/02/2014)  . Ischemic cardiomyopathy    ef 35-40% per cath 08-28-2013  . Kidney stones "years ago"   "passed them"  . Melanoma of ear ([place])    "right"  . Mild dementia   . Myocardial infarction ([place]) 1986; 1997  . Nocturia   . OAB (overactive bladder)   . Prostate cancer ([place]) 1998   S/P   [place]; [place]  . S/P CABG (coronary artery bypass graft)    [place]  . Skipped heart beats    occasional  . Urethral stricture   . UTI (urinary tract infection) 09/2015  . Wears hearing aid    bilateral  . Wears partial dentures    Past Surgical History:  Procedure Laterality Date  . CARDIAC CATHETERIZATION  02-22-2005  dr Vidal Schwalbe   mild to moderate lv dysfunction with inferobasilar akinesis/  totally occluded SVG to Intermediate Diagonal and SVG to PDA and RCA branches, totally occluded native coronary circulation with diffuse disease pLAD diagonal system with potentially could be ischemic/  patent SVG to OM with collaterals to dRCA and patent LIMA to LAD and diagonal systemss  . CARDIAC CATHETERIZATION  08-28-2013  DR Daneen Schick   widely patent sequential left internal graft to the diagonal/LAD, widely patent SVG to OM with proximal 50% narrowing noted in the graft, total occlusion SVG's to RCA, RI, and the Diagonal/ LV dysfunction with inferobasal aneurysm and mid anterior wall region of akinesis/  overall EF 35-40%/  Total occlusion of the navtive circulation/  no significant change compared to 2006 cath  . CARDIOVASCULAR STRESS TEST  08-01-2011  dr Angelena Form   inferior scar and possible soft tissue attenuation with minimal peri-infarct ischemia, small region of anterior ischemia and scar/  LVEF 53% LV wall motion with inferior hypokinesis/ no significant change from scan july 2011  . CATARACT EXTRACTION W/ INTRAOCULAR LENS  IMPLANT, BILATERAL Bilateral 2007  . CORONARY ARTERY BYPASS GRAFT  11/ [place]-- 6 vessel/  1997 Re-do 5 vessel  . CYSTOSCOPY WITH URETHRAL DILATATION N/A 12/25/2013   Procedure: CYSTOSCOPY WITH URETHRAL DILATATION, WITH BIOPSY;  Surgeon: Bernestine Amass, MD;  [facility]: [facility];  Service: Urology;  Laterality: N/A;  . CYSTOSCOPY WITH URETHRAL DILATATION N/A 12/22/2014   Procedure:  CYSTOSCOPY WITH URETHRAL DILATATION;  Surgeon: Rana Snare, MD;  Location: [facility];  Service: Urology;  Laterality: N/A;  BALLOON DILATION CATHETER    . EUS  10/05/2011   Procedure: ESOPHAGEAL ENDOSCOPIC ULTRASOUND (EUS) RADIAL;  Surgeon: Arta Silence, MD;  Location: [facility];  Service: Endoscopy;  Laterality: N/A;  . INSERTION OF SUPRAPUBIC CATHETER N/A 12/22/2014   Procedure: INSERTION OF SUPRAPUBIC CATHETER ;  Surgeon: Rana Snare, MD;  Location: [facility];  Service: Urology;  Laterality: N/A;  . LAPAROSCOPIC CHOLECYSTECTOMY  12-23-2005  . LEFT HEART CATHETERIZATION WITH  CORONARY/GRAFT ANGIOGRAM N/A 08/28/2013   Procedure: LEFT HEART CATHETERIZATION WITH Beatrix Fetters;  Surgeon: Sinclair Grooms, MD;  Location[facility] LAB;  Service: Cardiovascular;  Laterality: N/A;  . MELANOMA EXCISION  X 1   "ear"  . RADIOACTIVE PROSTATE SEED IMPLANTS  1998  . SKIN CANCER EXCISION  X 2   "top of head"  . TONSILLECTOMY AND ADENOIDECTOMY  1954   Family History  Problem Relation Age of Onset  . Heart disease Mother   . Diabetes Father   . Heart disease Brother    History  Sexual Activity  . Sexual activity: Not on file    Outpatient Encounter Prescriptions as of 11/24/2016  Medication Sig  . acetaminophen (TYLENOL) 500 MG tablet Take 500 mg by mouth every 6 (six) hours as needed for pain or fever.   Marland Kitchen aspirin EC 81 MG EC tablet Take 1 tablet (81 mg total) by mouth daily.  Marland Kitchen atorvastatin (LIPITOR) 80 MG tablet Take 1 tablet (80 mg total) by mouth every evening. (Patient taking differently: Take 40 mg by mouth every evening. )  . Cholecalciferol (VITAMIN D) 2000 UNITS tablet Take 2,000 Units by mouth daily.  . flavoxATE (URISPAS) 100 MG tablet Take 100 mg by mouth 3 (three) times daily as needed for bladder spasms.  Marland Kitchen galantamine (RAZADYNE ER) 8 MG 24 hr capsule Take 8 mg by mouth daily with breakfast.  . hydroxypropyl methylcellulose (ISOPTO TEARS) 2.5 % ophthalmic solution Place 1  drop into both eyes 2 (two) times daily.   . Multiple Vitamin (MULTIVITAMIN WITH MINERALS) TABS Take 1 tablet by mouth daily.  . nitroGLYCERIN (NITROSTAT) 0.4 MG SL tablet Place 0.4 mg under the tongue every 5 (five) minutes as needed for chest pain.   . ranitidine (ZANTAC) 150 MG tablet Take 1 tablet (150 mg total) by mouth 2 (two) times daily.  . furosemide (LASIX) 20 MG tablet Take 1 tablet (20 mg total) by mouth daily as needed. For swelling  . simethicone (MYLICON) 40 YW/7.3XT drops Take 1.2 mLs (80 mg total) by mouth every 6 (six) hours as needed for flatulence. (Patient not taking: Reported on 11/24/2016)   No facility-administered encounter medications on file as of 11/24/2016.     Activities of Daily Living In your present state of health, do you have any difficulty performing the following activities: 11/24/2016 02/03/2016  Hearing? N N  Comment - wears hearing aids  Vision? N N  Difficulty concentrating or making decisions? N Y  Walking or climbing stairs? N N  Dressing or bathing? N N  Doing errands, shopping? N Y  Conservation officer, nature and eating ? - N  Using the Toilet? - N  In the past six months, have you accidently leaked urine? - N  Comment - has a supra pubic catheter  Do you have problems with loss of bowel control? - N  Managing your Medications? - Y  Managing your Finances? - N  Housekeeping or managing your Housekeeping? - N  Some recent data might be hidden    Patient Care Team: Marletta Lor, MD as PCP - General    Assessment:     Exercise Activities and Dietary recommendations Does exercises every day morning; lower and upper body    Goals    . patient           Keep taking good care of yourself and doing your exercises       Fall Risk Fall Risk  11/24/2016 02/03/2016 10/26/2015  01/22/2014 09/17/2013  Falls in the past year? No No Yes No Yes  Number falls in past yr: (No Data) - 1 - 2 or more  Comment "tries not too"  - - - -  Injury with Fall? -  - No - -  Risk Factor Category  - - - - High Fall Risk  Risk for fall due to : Impaired balance/gait - - - Impaired balance/gait  Risk for fall due to: Comment at risk due to dizziness  - - - -   Depression Screen PHQ 2/9 Scores 11/24/2016 02/03/2016 10/26/2015 01/22/2014  PHQ - 2 Score 0 0 0 0     Cognitive Function MMSE - Mini Mental State Exam 11/24/2016  Orientation to time 5  Orientation to Place 5  Registration 3  Attention/ Calculation 4  Recall 1  Language- name 2 objects 2  Language- repeat 1  Language- follow 3 step command 3  Language- read & follow direction 1  Write a sentence 1  Copy design 1  Total score 27        Immunization History  Administered Date(s) Administered  . Influenza, High Dose Seasonal PF 01/24/2013, 11/24/2016  . Influenza,inj,Quad PF,6+ Mos 01/03/2014, 12/07/2014  . Pneumococcal-Unspecified 07/26/2012  . Td 06/03/1996, 10/26/2006   Screening Tests Health Maintenance  Topic Date Due  . PNA vac Low Risk Adult (2 of 2 - PCV13) 07/26/2013  . TETANUS/TDAP  10/25/2016  . INFLUENZA VACCINE  10/26/2016      Plan:     PCP Notes   Health Maintenance Discussed Pneumonia vaccines Will [facility] the [facility] Will take his Tdap  at the [place] Agreed to take his flu vaccine today and was given high-dose.  Abnormal Screens  MMSE 27/30 Alert and oriented; recall was one of 3; serial sevens from 100 was 45; otherwise did well  Referrals  No referrals today  Patient concerns; No particular issues discussed although he is tired of wearing a catheter.  Nurse Concerns; As noted  Next PCP apt To be scheduled      I have personally reviewed and noted the following in the patient's chart:   . Medical and social history . Use of alcohol, tobacco or illicit drugs  . Current medications and supplements . Functional ability and status . Nutritional status . Physical activity . Advanced directives . List of other  physicians . Hospitalizations, surgeries, and ER visits in previous 12 months . Vitals . Screenings to include cognitive, depression, and falls . Referrals and appointments  In addition, I have reviewed and discussed with patient certain preventive protocols, quality metrics, and best practice recommendations. A written personalized care plan for preventive services as well as general preventive health recommendations were provided to patient.     CNOBS,JGGEZ, RN  11/24/2016  Subsequent Medicare wellness visit results reviewed and agree with clinical findings.  Nyoka Cowden

## 2016-11-24 ENCOUNTER — Ambulatory Visit (INDEPENDENT_AMBULATORY_CARE_PROVIDER_SITE_OTHER): Payer: Medicare Other

## 2016-11-24 ENCOUNTER — Telehealth: Payer: Self-pay | Admitting: Internal Medicine

## 2016-11-24 DIAGNOSIS — Z0000 Encounter for general adult medical examination without abnormal findings: Secondary | ICD-10-CM

## 2016-11-24 DIAGNOSIS — Z23 Encounter for immunization: Secondary | ICD-10-CM

## 2016-11-24 NOTE — Patient Instructions (Addendum)
Mr. Engelmann , Thank you for taking time to come for your Medicare Wellness Visit. I appreciate your ongoing commitment to your health goals. Please review the following plan we discussed and let me know if I can assist you in the future.   When you go the [place]; or ask they send you you immunization history Did you prevnar (13) or the pneumovax (23)   Need TDAP as well; this is the tetanus with pertussis or (whooping cough) will take that at the [facility]  Flu vaccine - takes at [facility] on [address]  Will take his flu vaccine here  Shingrix is a vaccine for the prevention of Shingles in Adults 50 and older.  If you are on Medicare, you can request a prescription from your doctor to be filled at a pharmacy.  Please check with your benefits regarding applicable copays or out of pocket expenses.  The Shingrix is given in 2 vaccines approx 8 weeks apart. You must receive the 2nd dose prior to 6 months from receipt of the first.      These are the goals we discussed: Goals    . patient           Keep taking good care of yourself and doing your exercises        This is a list of the screening recommended for you and due dates:  Health Maintenance  Topic Date Due  . Pneumonia vaccines (2 of 2 - PCV13) 07/26/2013  . Tetanus Vaccine  10/25/2016  . Flu Shot  10/26/2016        Fall Prevention in the Home Falls can cause injuries. They can happen to people of all ages. There are many things you can do to make your home safe and to help prevent falls. What can I do on the outside of my home?  Regularly fix the edges of walkways and driveways and fix any cracks.  Remove anything that might make you trip as you walk through a door, such as a raised step or threshold.  Trim any bushes or trees on the path to your home.  Use bright outdoor lighting.  Clear any walking paths of anything that might make someone trip, such as rocks or tools.  Regularly check to see if handrails are loose or  broken. Make sure that both sides of any steps have handrails.  Any raised decks and porches should have guardrails on the edges.  Have any leaves, snow, or ice cleared regularly.  Use sand or salt on walking paths during winter.  Clean up any spills in your garage right away. This includes oil or grease spills. What can I do in the bathroom?  Use night lights.  Install grab bars by the toilet and in the tub and shower. Do not use towel bars as grab bars.  Use non-skid mats or decals in the tub or shower.  If you need to sit down in the shower, use a plastic, non-slip stool.  Keep the floor dry. Clean up any water that spills on the floor as soon as it happens.  Remove soap buildup in the tub or shower regularly.  Attach bath mats securely with double-sided non-slip rug tape.  Do not have throw rugs and other things on the floor that can make you trip. What can I do in the bedroom?  Use night lights.  Make sure that you have a light by your bed that is easy to reach.  Do not use any sheets  or blankets that are too big for your bed. They should not hang down onto the floor.  Have a firm chair that has side arms. You can use this for support while you get dressed.  Do not have throw rugs and other things on the floor that can make you trip. What can I do in the kitchen?  Clean up any spills right away.  Avoid walking on wet floors.  Keep items that you use a lot in easy-to-reach places.  If you need to reach something above you, use a strong step stool that has a grab bar.  Keep electrical cords out of the way.  Do not use floor polish or wax that makes floors slippery. If you must use wax, use non-skid floor wax.  Do not have throw rugs and other things on the floor that can make you trip. What can I do with my stairs?  Do not leave any items on the stairs.  Make sure that there are handrails on both sides of the stairs and use them. Fix handrails that are  broken or loose. Make sure that handrails are as long as the stairways.  Check any carpeting to make sure that it is firmly attached to the stairs. Fix any carpet that is loose or worn.  Avoid having throw rugs at the top or bottom of the stairs. If you do have throw rugs, attach them to the floor with carpet tape.  Make sure that you have a light switch at the top of the stairs and the bottom of the stairs. If you do not have them, ask someone to add them for you. What else can I do to help prevent falls?  Wear shoes that: ? Do not have high heels. ? Have rubber bottoms. ? Are comfortable and fit you well. ? Are closed at the toe. Do not wear sandals.  If you use a stepladder: ? Make sure that it is fully opened. Do not climb a closed stepladder. ? Make sure that both sides of the stepladder are locked into place. ? Ask someone to hold it for you, if possible.  Clearly mark and make sure that you can see: ? Any grab bars or handrails. ? First and last steps. ? Where the edge of each step is.  Use tools that help you move around (mobility aids) if they are needed. These include: ? Canes. ? Walkers. ? Scooters. ? Crutches.  Turn on the lights when you go into a dark area. Replace any light bulbs as soon as they burn out.  Set up your furniture so you have a clear path. Avoid moving your furniture around.  If any of your floors are uneven, fix them.  If there are any pets around you, be aware of where they are.  Review your medicines with your doctor. Some medicines can make you feel dizzy. This can increase your chance of falling. Ask your doctor what other things that you can do to help prevent falls. This information is not intended to replace advice given to you by your health care provider. Make sure you discuss any questions you have with your health care provider. Document Released: 01/08/2009 Document Revised: 08/20/2015 Document Reviewed: 04/18/2014 Elsevier  Interactive Patient Education  2018 [place] Maintenance, Male A healthy lifestyle and preventive care is important for your health and wellness. Ask your health care provider about what schedule of regular examinations is right for you. What should I know  about weight and diet? Eat a Healthy Diet  Eat plenty of vegetables, fruits, whole grains, low-fat dairy products, and lean protein.  Do not eat a lot of foods high in solid fats, added sugars, or salt.  Maintain a Healthy Weight Regular exercise can help you achieve or maintain a healthy weight. You should:  Do at least 150 minutes of exercise each week. The exercise should increase your heart rate and make you sweat (moderate-intensity exercise).  Do strength-training exercises at least twice a week.  Watch Your Levels of Cholesterol and Blood Lipids  Have your blood tested for lipids and cholesterol every 5 years starting at 81 years of age. If you are at high risk for heart disease, you should start having your blood tested when you are 81 years old. You may need to have your cholesterol levels checked more often if: ? Your lipid or cholesterol levels are high. ? You are older than 81 years of age. ? You are at high risk for heart disease.  What should I know about cancer screening? Many types of cancers can be detected early and may often be prevented. Lung Cancer  You should be screened every year for lung cancer if: ? You are a current smoker who has smoked for at least 30 years. ? You are a former smoker who has quit within the past 15 years.  Talk to your health care provider about your screening options, when you should start screening, and how often you should be screened.  Colorectal Cancer  Routine colorectal cancer screening usually begins at 81 years of age and should be repeated every 5-10 years until you are 81 years old. You may need to be screened more often if early forms of precancerous  polyps or small growths are found. Your health care provider may recommend screening at an earlier age if you have risk factors for colon cancer.  Your health care provider may recommend using home test kits to check for hidden blood in the stool.  A small camera at the end of a tube can be used to examine your colon (sigmoidoscopy or colonoscopy). This checks for the earliest forms of colorectal cancer.  Prostate and Testicular Cancer  Depending on your age and overall health, your health care provider may do certain tests to screen for prostate and testicular cancer.  Talk to your health care provider about any symptoms or concerns you have about testicular or prostate cancer.  Skin Cancer  Check your skin from head to toe regularly.  Tell your health care provider about any new moles or changes in moles, especially if: ? There is a change in a mole's size, shape, or color. ? You have a mole that is larger than a pencil eraser.  Always use sunscreen. Apply sunscreen liberally and repeat throughout the day.  Protect yourself by wearing long sleeves, pants, a wide-brimmed hat, and sunglasses when outside.  What should I know about heart disease, diabetes, and high blood pressure?  If you are 72-3 years of age, have your blood pressure checked every 3-5 years. If you are 38 years of age or older, have your blood pressure checked every year. You should have your blood pressure measured twice-once when you are at a hospital or clinic, and once when you are not at a hospital or clinic. Record the average of the two measurements. To check your blood pressure when you are not at a hospital or clinic, you can use: ? An automated  blood pressure machine at a pharmacy. ? A home blood pressure monitor.  Talk to your health care provider about your target blood pressure.  If you are between 76-53 years old, ask your health care provider if you should take aspirin to prevent heart  disease.  Have regular diabetes screenings by checking your fasting blood sugar level. ? If you are at a normal weight and have a low risk for diabetes, have this test once every three years after the age of 64. ? If you are overweight and have a high risk for diabetes, consider being tested at a younger age or more often.  A one-time screening for abdominal aortic aneurysm (AAA) by ultrasound is recommended for men aged 66-75 years who are current or former smokers. What should I know about preventing infection? Hepatitis B If you have a higher risk for hepatitis B, you should be screened for this virus. Talk with your health care provider to find out if you are at risk for hepatitis B infection. Hepatitis C Blood testing is recommended for:  Everyone born from [birth year] through 1963.  Anyone with known risk factors for hepatitis C.  Sexually Transmitted Diseases (STDs)  You should be screened each year for STDs including gonorrhea and chlamydia if: ? You are sexually active and are younger than 81 years of age. ? You are older than 81 years of age and your health care provider tells you that you are at risk for this type of infection. ? Your sexual activity has changed since you were last screened and you are at an increased risk for chlamydia or gonorrhea. Ask your health care provider if you are at risk.  Talk with your health care provider about whether you are at high risk of being infected with HIV. Your health care provider may recommend a prescription medicine to help prevent HIV infection.  What else can I do?  Schedule regular health, dental, and eye exams.  Stay current with your vaccines (immunizations).  Do not use any tobacco products, such as cigarettes, chewing tobacco, and e-cigarettes. If you need help quitting, ask your health care provider.  Limit alcohol intake to no more than 2 drinks per day. One drink equals 12 ounces of beer, 5 ounces of wine, or 1 ounces of  hard liquor.  Do not use street drugs.  Do not share needles.  Ask your health care provider for help if you need support or information about quitting drugs.  Tell your health care provider if you often feel depressed.  Tell your health care provider if you have ever been abused or do not feel safe at home. This information is not intended to replace advice given to you by your health care provider. Make sure you discuss any questions you have with your health care provider. Document Released: 09/10/2007 Document Revised: 11/11/2015 Document Reviewed: 12/16/2014 Elsevier Interactive Patient Education  [employer].

## 2016-11-24 NOTE — Telephone Encounter (Signed)
Pt dropped off DMV Handicap placard app for Dr Burnice Logan to complete and mail to pt at [place] [id number]. Call pt with questions [phone]. Placed app in physician folder at front office area. cb

## 2016-12-15 DIAGNOSIS — N359 Urethral stricture, unspecified: Secondary | ICD-10-CM | POA: Diagnosis not present

## 2016-12-16 DIAGNOSIS — Z23 Encounter for immunization: Secondary | ICD-10-CM | POA: Diagnosis not present

## 2017-01-09 DIAGNOSIS — T148XXA Other injury of unspecified body region, initial encounter: Secondary | ICD-10-CM | POA: Diagnosis not present

## 2017-01-13 DIAGNOSIS — N35011 Post-traumatic bulbous urethral stricture: Secondary | ICD-10-CM | POA: Diagnosis not present

## 2017-02-10 DIAGNOSIS — N35119 Postinfective urethral stricture, not elsewhere classified, male, unspecified: Secondary | ICD-10-CM | POA: Diagnosis not present

## 2017-02-14 ENCOUNTER — Emergency Department (HOSPITAL_COMMUNITY)
Admission: EM | Admit: 2017-02-14 | Discharge: 2017-02-14 | Disposition: A | Discharge status: Home / Self Care | Payer: Medicare Other | Source: Home / Self Care | Attending: Emergency Medicine | Admitting: Emergency Medicine

## 2017-02-14 ENCOUNTER — Encounter (HOSPITAL_COMMUNITY): Payer: Self-pay | Admitting: *Deleted

## 2017-02-14 DIAGNOSIS — Z7982 Long term (current) use of aspirin: Secondary | ICD-10-CM

## 2017-02-14 DIAGNOSIS — I25110 Atherosclerotic heart disease of native coronary artery with unstable angina pectoris: Secondary | ICD-10-CM

## 2017-02-14 DIAGNOSIS — T83098A Other mechanical complication of other indwelling urethral catheter, initial encounter: Secondary | ICD-10-CM | POA: Diagnosis not present

## 2017-02-14 DIAGNOSIS — T83091A Other mechanical complication of indwelling urethral catheter, initial encounter: Secondary | ICD-10-CM | POA: Insufficient documentation

## 2017-02-14 DIAGNOSIS — Z951 Presence of aortocoronary bypass graft: Secondary | ICD-10-CM

## 2017-02-14 DIAGNOSIS — I252 Old myocardial infarction: Secondary | ICD-10-CM | POA: Insufficient documentation

## 2017-02-14 DIAGNOSIS — T83510A Infection and inflammatory reaction due to cystostomy catheter, initial encounter: Secondary | ICD-10-CM | POA: Diagnosis not present

## 2017-02-14 DIAGNOSIS — R339 Retention of urine, unspecified: Secondary | ICD-10-CM

## 2017-02-14 DIAGNOSIS — Y828 Other medical devices associated with adverse incidents: Secondary | ICD-10-CM | POA: Insufficient documentation

## 2017-02-14 DIAGNOSIS — N182 Chronic kidney disease, stage 2 (mild): Secondary | ICD-10-CM | POA: Insufficient documentation

## 2017-02-14 DIAGNOSIS — Z87891 Personal history of nicotine dependence: Secondary | ICD-10-CM

## 2017-02-14 DIAGNOSIS — I5022 Chronic systolic (congestive) heart failure: Secondary | ICD-10-CM | POA: Insufficient documentation

## 2017-02-14 DIAGNOSIS — F0390 Unspecified dementia without behavioral disturbance: Secondary | ICD-10-CM

## 2017-02-14 DIAGNOSIS — Z8546 Personal history of malignant neoplasm of prostate: Secondary | ICD-10-CM | POA: Insufficient documentation

## 2017-02-14 DIAGNOSIS — A419 Sepsis, unspecified organism: Secondary | ICD-10-CM | POA: Diagnosis not present

## 2017-02-14 DIAGNOSIS — I130 Hypertensive heart and chronic kidney disease with heart failure and stage 1 through stage 4 chronic kidney disease, or unspecified chronic kidney disease: Secondary | ICD-10-CM

## 2017-02-14 NOTE — ED Notes (Signed)
Md changed out sp cath. No problems. 648mls clear yellow urine out. Will continue to monitor. Pt states that the pain is gone now and he feels much better.

## 2017-02-14 NOTE — ED Provider Notes (Signed)
[facility] EMERGENCY DEPARTMENT Provider Note   CSN: [id number] Arrival date & time: 02/14/17  0357     History   Chief Complaint Chief Complaint  Patient presents with  . Urinary Retention    HPI Ronald [name] is a 83 y.o. male.  The history is provided by the patient and the spouse.  Illness  This is a new problem. The current episode started 6 to 12 hours ago. The problem occurs constantly. The problem has been rapidly worsening. Associated symptoms include abdominal pain. Nothing aggravates the symptoms. Nothing relieves the symptoms.  Patient with h/o CAD/CHF He has suprapubic catheter in place for over 2 yrs Presents with acute obstruction of catheter Pt report it was replaced by nurse at urology office last week (he gets it changed q 4 weeks) Over past several hrs urine output reduced and now he feels it is obstructed No fever/vomiting/back pain He reports urine was dark than usual   Past Medical History:  Diagnosis Date  . Arthritis    "joints ache" (01/02/2014)  . At risk for sleep apnea    STOP-BANG= 4    SENT TO PCP 12-23-2013  . Bladder calculi   . CHF (congestive heart failure) ([place])   . Chronic cystitis   . Coronary artery disease CARDIOLOGIST-  DR Grace Bushy  MI -- S/P  11/89 CABG x6 (70% circ; 90% PD; 60-70% distal left main; 30% left circ; 90% 1st diag;, 2nd diag and 3rd diag. w/90%,  mild stenosis LAD and 60-70% pLAD)  Re-do CABG x5 in 1997  . Diverticulosis   . GERD (gastroesophageal reflux disease)   . History of Bell's palsy    RIGHT SIDE-- NO RESIDUAL  . Hx of dizziness   . Hyperlipidemia   . Hypertension    "not anymore" (01/02/2014)  . Ischemic cardiomyopathy    ef 35-40% per cath 08-28-2013  . Kidney stones "years ago"   "passed them"  . Melanoma of ear ([place])    "right"  . Mild dementia   . Myocardial infarction ([place]) 1986; 1997  . Nocturia   . OAB (overactive bladder)   . Prostate cancer ([place]) 1998   S/P   [place]; [place]  . S/P CABG (coronary artery bypass graft)    [place]  . Skipped heart beats    occasional  . Urethral stricture   . UTI (urinary tract infection) 09/2015  . Wears hearing aid    bilateral  . Wears partial dentures     Patient Active Problem List   Diagnosis Date Noted  . AKI (acute kidney injury) ([place])   . Stage 2 chronic kidney disease   . Bacteremia due to Pseudomonas 01/21/2016  . Debility 01/21/2016  . History of Bell's palsy   . Gastroesophageal reflux disease without esophagitis   . Coronary artery disease involving coronary bypass graft of native heart without angina pectoris   . History of prostate cancer   . Suprapubic catheter ([place])   . Incontinence of feces   . Renal failure syndrome   . Acute respiratory failure with hypoxia ([place])   . Bacteremia   . NSTEMI (non-ST elevated myocardial infarction) ([place]) 01/16/2016  . Septic shock ([place])   . ARF (acute renal failure) ([place]) 10/13/2015  . Sepsis ([place]) 12/05/2014  . Urinary tract infection associated with cystostomy catheter ([place]) 10/03/2014  . Acute lower UTI 09/10/2014  . Bacteremia due to  Enterococcus 01/22/2014  . Chronic systolic CHF (congestive heart failure) ([place]) 01/22/2014  . Urethral stricture 12/25/2013  . Bladder calculus 12/25/2013  . LV dysfunction 09/23/2013  . Unstable angina ([place]) 08/27/2013  . Bacteremia due to Escherichia coli 05/20/2012  . Urinary tract infection 09/15/2011  . Calculus of kidney 09/12/2011  . HYPERCHOLESTEROLEMIA 10/18/2007  . HTN (hypertension) 10/18/2007  . OSTEOARTHRITIS 02/07/2007  . BELL'S PALSY, RIGHT 10/26/2006  . Coronary atherosclerosis 10/26/2006  . PROSTATE CANCER, HX OF 10/26/2006  . NEPHROLITHIASIS, HX OF 10/26/2006    Past Surgical History:  Procedure Laterality Date  . CARDIAC CATHETERIZATION  02-22-2005  dr Vidal Schwalbe   mild to moderate lv dysfunction with inferobasilar akinesis/  totally occluded  SVG to Intermediate Diagonal and SVG to PDA and RCA branches, totally occluded native coronary circulation with diffuse disease pLAD diagonal system with potentially could be ischemic/  patent SVG to OM with collaterals to dRCA and patent LIMA to LAD and diagonal systemss  . CARDIAC CATHETERIZATION  08-28-2013  DR Daneen Schick   widely patent sequential left internal graft to the diagonal/LAD, widely patent SVG to OM with proximal 50% narrowing noted in the graft, total occlusion SVG's to RCA, RI, and the Diagonal/ LV dysfunction with inferobasal aneurysm and mid anterior wall region of akinesis/  overall EF 35-40%/  Total occlusion of the navtive circulation/  no significant change compared to 2006 cath  . CARDIOVASCULAR STRESS TEST  08-01-2011  dr Angelena Form   inferior scar and possible soft tissue attenuation with minimal peri-infarct ischemia, small region of anterior ischemia and scar/  LVEF 53% LV wall motion with inferior hypokinesis/ no significant change from scan july 2011  . CATARACT EXTRACTION W/ INTRAOCULAR LENS  IMPLANT, BILATERAL Bilateral 2007  . CORONARY ARTERY BYPASS GRAFT  11/ [place]-- 6 vessel/  1997 Re-do 5 vessel  . CYSTOSCOPY WITH URETHRAL DILATATION N/A 12/22/2014   Performed by Rana Snare, MD at [facility] ORS  . CYSTOSCOPY WITH URETHRAL DILATATION, WITH BIOPSY N/A 12/25/2013   Performed by Bernestine Amass, MD at [place] ([facility]) RADIAL N/A 10/05/2011   Performed by Arta Silence, MD at [facility]  . INSERTION OF SUPRAPUBIC CATHETER N/A 12/22/2014   Performed by Rana Snare, MD at [facility] ORS  . LAPAROSCOPIC CHOLECYSTECTOMY  12-23-2005  . LEFT HEART CATHETERIZATION WITH CORONARY/GRAFT ANGIOGRAM N/A 08/28/2013   Performed by Belva Crome, MD at [facility] CATH LAB  . MELANOMA EXCISION  X 1   "ear"  . RADIOACTIVE PROSTATE SEED IMPLANTS  1998  . SKIN CANCER EXCISION  X 2   "top of head"  . TONSILLECTOMY AND ADENOIDECTOMY   1954       Home Medications    Prior to Admission medications   Medication Sig Start Date End Date Taking? Authorizing Provider  acetaminophen (TYLENOL) 500 MG tablet Take 500 mg by mouth every 6 (six) hours as needed for pain or fever.     [provider]  aspirin EC 81 MG EC tablet Take 1 tablet (81 mg total) by mouth daily. 08/29/13   Lyda Jester M, PA-C  atorvastatin (LIPITOR) 80 MG tablet Take 1 tablet (80 mg total) by mouth every evening. Patient taking differently: Take 40 mg by mouth every evening.  01/21/16   Orson Eva, MD  Cholecalciferol (VITAMIN D) 2000 UNITS tablet Take 2,000 Units by mouth daily.    [provider]  flavoxATE Bard Herbert)  100 MG tablet Take 100 mg by mouth 3 (three) times daily as needed for bladder spasms.    [provider]  furosemide (LASIX) 20 MG tablet Take 1 tablet (20 mg total) by mouth daily as needed. For swelling 05/18/16 08/16/16  Burnell Blanks, MD  galantamine (RAZADYNE ER) 8 MG 24 hr capsule Take 8 mg by mouth daily with breakfast.    [provider]  hydroxypropyl methylcellulose (ISOPTO TEARS) 2.5 % ophthalmic solution Place 1 drop into both eyes 2 (two) times daily.     [provider]  Multiple Vitamin (MULTIVITAMIN WITH MINERALS) TABS Take 1 tablet by mouth daily.    [provider]  nitroGLYCERIN (NITROSTAT) 0.4 MG SL tablet Place 0.4 mg under the tongue every 5 (five) minutes as needed for chest pain.     [provider]  ranitidine (ZANTAC) 150 MG tablet Take 1 tablet (150 mg total) by mouth 2 (two) times daily. 05/18/16   Burnell Blanks, MD  simethicone (MYLICON) 40 EH/2.0NO drops Take 1.2 mLs (80 mg total) by mouth every 6 (six) hours as needed for flatulence. Patient not taking: Reported on 11/24/2016 01/29/16   Love, Ivan Anchors, PA-C    Family History Family History  Problem Relation Age of Onset  . Heart disease Mother   . Diabetes Father   . Heart  disease Brother     Social History Social History   Tobacco Use  . Smoking status: Former Smoker    Years: 40.00    Types: Cigars    Last attempt to quit: 12/24/1983    Years since quitting: 33.1  . Smokeless tobacco: Former Systems developer    Types: Chew    Quit date: 08/28/2013  . Tobacco comment: smoked a pipe  Substance Use Topics  . Alcohol use: No  . Drug use: No     Allergies   Pantoprazole; Nitrofurantoin; Sulfa antibiotics; and Lidocaine   Review of Systems Review of Systems  Constitutional: Negative for fever.  HENT: Positive for congestion.   Gastrointestinal: Positive for abdominal pain.  Genitourinary: Positive for decreased urine volume and difficulty urinating.  All other systems reviewed and are negative.    Physical Exam Updated Vital Signs BP (!) 213/111 (BP Location: Left Arm)   Pulse 66   Temp 97.7 F (36.5 C) (Oral)   Resp 20   Ht 1.651 m (5\' 5" )   Wt 72.6 kg (160 lb)   SpO2 97%   BMI 26.63 kg/m   Physical Exam CONSTITUTIONAL:Elderly, no acute distres HEAD: Normocephalic/atraumatic EYES: EOMI  ENMT: Mucous membranes moist, nasal congestion NECK: supple no meningeal signs SPINE/BACK:kyphotic spine CV: S1/S2 noted LUNGS: Lungs are clear to auscultation bilaterally ABDOMEN: soft, suprapubic tenderness.  Catheter in place, area around catheter is c/d/i GU:no cva tenderness NEURO: Pt is awake/alert/appropriate, moves all extremitiesx4.  No facial droop.   EXTREMITIES:  full ROM SKIN: warm, color normal PSYCH: no abnormalities of mood noted, alert and oriented to situation   ED Treatments / Results  Labs (all labs ordered are listed, but only abnormal results are displayed) Labs Reviewed - No data to display  EKG  EKG Interpretation None       Radiology No results found.  Procedures BLADDER CATHETERIZATION Date/Time: 02/14/2017 5:29 AM Performed by: Ripley Fraise, MD Authorized by: Ripley Fraise, MD   Consent:    Consent  obtained:  Verbal   Consent given by:  Patient and spouse   Risks discussed:  Pain   Alternatives  discussed:  No treatment Pre-procedure details:    Procedure purpose:  Therapeutic   Preparation: Patient was prepped and draped in usual sterile fashion   Anesthesia (see MAR for exact dosages):    Anesthesia method:  None Procedure details:    Procedure performed by provider due to: suprapubic catheter.   Catheter type:  Foley   Catheter size:  16 Fr   Number of attempts:  1   Urine characteristics:  Clear Post-procedure details:    Patient tolerance of procedure:  Tolerated well, no immediate complications   (including critical care time)  Medications Ordered in ED Medications - No data to display   Initial Impression / Assessment and Plan / ED Course  I have reviewed the triage vital signs and the nursing notes.       Pt presents with obstructed SP catheter that was unable to be flushed I easily removed and replaced SP catheter without difficulty using sterile procedure Pt feels immediately better Urine is clear, no fever/vomiting, will defer workup for UTI 6:28 AM Pt stable He continues do well after foley change   Final Clinical Impressions(s) / ED Diagnoses   Final diagnoses:  Urinary retention  Obstruction of Foley catheter, initial encounter [facility])    ED Discharge Orders    None       Ripley Fraise, MD 02/14/17 [phone]

## 2017-02-14 NOTE — ED Notes (Signed)
Leg bag applied

## 2017-02-14 NOTE — ED Triage Notes (Signed)
States he has a super pubic cath for years and just had it changed Fri. States he noticed his urine was dark and not isn't able to pass any urine all now.

## 2017-02-15 ENCOUNTER — Encounter (HOSPITAL_COMMUNITY): Payer: Self-pay

## 2017-02-15 ENCOUNTER — Emergency Department (HOSPITAL_COMMUNITY): Payer: Medicare Other

## 2017-02-15 ENCOUNTER — Inpatient Hospital Stay (HOSPITAL_COMMUNITY)
Admission: EM | Admit: 2017-02-15 | Discharge: 2017-02-18 | DRG: 698 | Disposition: A | Discharge status: Home Health | Payer: Medicare Other | Attending: Family Medicine | Admitting: Family Medicine

## 2017-02-15 ENCOUNTER — Other Ambulatory Visit: Payer: Self-pay

## 2017-02-15 DIAGNOSIS — Z85820 Personal history of malignant melanoma of skin: Secondary | ICD-10-CM

## 2017-02-15 DIAGNOSIS — N179 Acute kidney failure, unspecified: Secondary | ICD-10-CM | POA: Diagnosis not present

## 2017-02-15 DIAGNOSIS — E7800 Pure hypercholesterolemia, unspecified: Secondary | ICD-10-CM | POA: Diagnosis present

## 2017-02-15 DIAGNOSIS — I110 Hypertensive heart disease with heart failure: Secondary | ICD-10-CM | POA: Diagnosis present

## 2017-02-15 DIAGNOSIS — I5022 Chronic systolic (congestive) heart failure: Secondary | ICD-10-CM | POA: Diagnosis present

## 2017-02-15 DIAGNOSIS — R5383 Other fatigue: Secondary | ICD-10-CM | POA: Diagnosis not present

## 2017-02-15 DIAGNOSIS — Z9359 Other cystostomy status: Secondary | ICD-10-CM

## 2017-02-15 DIAGNOSIS — N390 Urinary tract infection, site not specified: Secondary | ICD-10-CM | POA: Diagnosis present

## 2017-02-15 DIAGNOSIS — Z87440 Personal history of urinary (tract) infections: Secondary | ICD-10-CM | POA: Diagnosis not present

## 2017-02-15 DIAGNOSIS — K219 Gastro-esophageal reflux disease without esophagitis: Secondary | ICD-10-CM | POA: Diagnosis present

## 2017-02-15 DIAGNOSIS — E872 Acidosis: Secondary | ICD-10-CM | POA: Diagnosis present

## 2017-02-15 DIAGNOSIS — Y848 Other medical procedures as the cause of abnormal reaction of the patient, or of later complication, without mention of misadventure at the time of the procedure: Secondary | ICD-10-CM | POA: Diagnosis present

## 2017-02-15 DIAGNOSIS — I440 Atrioventricular block, first degree: Secondary | ICD-10-CM | POA: Diagnosis present

## 2017-02-15 DIAGNOSIS — Z8249 Family history of ischemic heart disease and other diseases of the circulatory system: Secondary | ICD-10-CM

## 2017-02-15 DIAGNOSIS — N3000 Acute cystitis without hematuria: Secondary | ICD-10-CM

## 2017-02-15 DIAGNOSIS — Z79899 Other long term (current) drug therapy: Secondary | ICD-10-CM | POA: Diagnosis not present

## 2017-02-15 DIAGNOSIS — I2510 Atherosclerotic heart disease of native coronary artery without angina pectoris: Secondary | ICD-10-CM | POA: Diagnosis present

## 2017-02-15 DIAGNOSIS — I255 Ischemic cardiomyopathy: Secondary | ICD-10-CM | POA: Diagnosis present

## 2017-02-15 DIAGNOSIS — I252 Old myocardial infarction: Secondary | ICD-10-CM | POA: Diagnosis not present

## 2017-02-15 DIAGNOSIS — R001 Bradycardia, unspecified: Secondary | ICD-10-CM | POA: Diagnosis present

## 2017-02-15 DIAGNOSIS — A419 Sepsis, unspecified organism: Secondary | ICD-10-CM | POA: Diagnosis present

## 2017-02-15 DIAGNOSIS — Z8546 Personal history of malignant neoplasm of prostate: Secondary | ICD-10-CM

## 2017-02-15 DIAGNOSIS — I361 Nonrheumatic tricuspid (valve) insufficiency: Secondary | ICD-10-CM | POA: Diagnosis not present

## 2017-02-15 DIAGNOSIS — I959 Hypotension, unspecified: Secondary | ICD-10-CM | POA: Diagnosis present

## 2017-02-15 DIAGNOSIS — Z833 Family history of diabetes mellitus: Secondary | ICD-10-CM | POA: Diagnosis not present

## 2017-02-15 DIAGNOSIS — Z87442 Personal history of urinary calculi: Secondary | ICD-10-CM | POA: Diagnosis not present

## 2017-02-15 DIAGNOSIS — T83510A Infection and inflammatory reaction due to cystostomy catheter, initial encounter: Principal | ICD-10-CM | POA: Diagnosis present

## 2017-02-15 DIAGNOSIS — Z7982 Long term (current) use of aspirin: Secondary | ICD-10-CM

## 2017-02-15 DIAGNOSIS — J101 Influenza due to other identified influenza virus with other respiratory manifestations: Secondary | ICD-10-CM | POA: Diagnosis present

## 2017-02-15 DIAGNOSIS — J90 Pleural effusion, not elsewhere classified: Secondary | ICD-10-CM | POA: Diagnosis not present

## 2017-02-15 DIAGNOSIS — I10 Essential (primary) hypertension: Secondary | ICD-10-CM | POA: Diagnosis present

## 2017-02-15 DIAGNOSIS — R509 Fever, unspecified: Secondary | ICD-10-CM | POA: Diagnosis not present

## 2017-02-15 DIAGNOSIS — R6520 Severe sepsis without septic shock: Secondary | ICD-10-CM | POA: Diagnosis present

## 2017-02-15 DIAGNOSIS — I480 Paroxysmal atrial fibrillation: Secondary | ICD-10-CM | POA: Diagnosis present

## 2017-02-15 LAB — PROTIME-INR
INR: 1.11
Prothrombin Time: 14.2 seconds (ref 11.4–15.2)

## 2017-02-15 LAB — CBC WITH DIFFERENTIAL/PLATELET
Basophils Absolute: 0 10*3/uL (ref 0.0–0.1)
Basophils Relative: 0 %
Eosinophils Absolute: 0 10*3/uL (ref 0.0–0.7)
Eosinophils Relative: 0 %
HCT: 41.1 % (ref 39.0–52.0)
Hemoglobin: 14 g/dL (ref 13.0–17.0)
Lymphocytes Relative: 7 %
Lymphs Abs: 1.1 10*3/uL (ref 0.7–4.0)
MCH: 31.5 pg (ref 26.0–34.0)
MCHC: 34.1 g/dL (ref 30.0–36.0)
MCV: 92.4 fL (ref 78.0–100.0)
Monocytes Absolute: 1 10*3/uL (ref 0.1–1.0)
Monocytes Relative: 6 %
Neutro Abs: 13.9 10*3/uL — ABNORMAL HIGH (ref 1.7–7.7)
Neutrophils Relative %: 87 %
Platelets: 111 10*3/uL — ABNORMAL LOW (ref 150–400)
RBC: 4.45 MIL/uL (ref 4.22–5.81)
RDW: 13.5 % (ref 11.5–15.5)
WBC: 16 10*3/uL — ABNORMAL HIGH (ref 4.0–10.5)

## 2017-02-15 LAB — COMPREHENSIVE METABOLIC PANEL
ALT: 21 U/L (ref 17–63)
AST: 32 U/L (ref 15–41)
Albumin: 4 g/dL (ref 3.5–5.0)
Alkaline Phosphatase: 72 U/L (ref 38–126)
Anion gap: 10 (ref 5–15)
BUN: 26 mg/dL — ABNORMAL HIGH (ref 6–20)
CO2: 24 mmol/L (ref 22–32)
Calcium: 9.4 mg/dL (ref 8.9–10.3)
Chloride: 104 mmol/L (ref 101–111)
Creatinine, Ser: 1.35 mg/dL — ABNORMAL HIGH (ref 0.61–1.24)
GFR calc Af Amer: 53 mL/min — ABNORMAL LOW (ref >60)
GFR calc non Af Amer: 46 mL/min — ABNORMAL LOW (ref >60)
Glucose, Bld: 164 mg/dL — ABNORMAL HIGH (ref 65–99)
Potassium: 3.8 mmol/L (ref 3.5–5.1)
Sodium: 138 mmol/L (ref 135–145)
Total Bilirubin: 1.6 mg/dL — ABNORMAL HIGH (ref 0.3–1.2)
Total Protein: 6.9 g/dL (ref 6.5–8.1)

## 2017-02-15 LAB — URINALYSIS, ROUTINE W REFLEX MICROSCOPIC
Bilirubin Urine: NEGATIVE
Glucose, UA: 150 mg/dL — AB
Ketones, ur: NEGATIVE mg/dL
Nitrite: POSITIVE — AB
Protein, ur: 30 mg/dL — AB
Specific Gravity, Urine: 1.015 (ref 1.005–1.030)
pH: 7 (ref 5.0–8.0)

## 2017-02-15 LAB — I-STAT CG4 LACTIC ACID, ED
Lactic Acid, Venous: 2.67 mmol/L (ref 0.5–1.9)
Lactic Acid, Venous: 1.49 mmol/L (ref 0.5–1.9)

## 2017-02-15 LAB — APTT: aPTT: 28 seconds (ref 24–36)

## 2017-02-15 MED ORDER — ACETAMINOPHEN 650 MG RE SUPP: 650.0000 mg | Freq: Four times a day (QID) | RECTAL | Status: DC | PRN

## 2017-02-15 MED ORDER — SODIUM CHLORIDE 0.9% FLUSH
3.0000 mL | Freq: Two times a day (BID) | INTRAVENOUS | Status: DC
  Administered 2017-02-15 – 2017-02-17 (×4): 3 mL via INTRAVENOUS

## 2017-02-15 MED ORDER — ENOXAPARIN SODIUM 40 MG/0.4ML ~~LOC~~ SOLN
40.0000 mg | SUBCUTANEOUS | Status: DC
  Administered 2017-02-15 – 2017-02-17 (×3): 40 mg via SUBCUTANEOUS
  Filled 2017-02-15 (×3): qty 0.4

## 2017-02-15 MED ORDER — SODIUM CHLORIDE 0.9 % IV BOLUS (SEPSIS)
250.0000 mL | Freq: Once | INTRAVENOUS | Status: AC
  Administered 2017-02-15: 250 mL via INTRAVENOUS

## 2017-02-15 MED ORDER — ACETAMINOPHEN 325 MG PO TABS
650.0000 mg | ORAL_TABLET | Freq: Four times a day (QID) | ORAL | Status: DC | PRN
  Administered 2017-02-15 – 2017-02-18 (×4): 650 mg via ORAL
  Filled 2017-02-15 (×4): qty 2

## 2017-02-15 MED ORDER — ORAL CARE MOUTH RINSE
15.0000 mL | Freq: Two times a day (BID) | OROMUCOSAL | Status: DC
  Administered 2017-02-16 – 2017-02-18 (×5): 15 mL via OROMUCOSAL

## 2017-02-15 MED ORDER — ONDANSETRON HCL 4 MG/2ML IJ SOLN: 4.0000 mg | Freq: Four times a day (QID) | INTRAMUSCULAR | Status: DC | PRN

## 2017-02-15 MED ORDER — PIPERACILLIN-TAZOBACTAM 3.375 G IVPB 30 MIN
3.3750 g | Freq: Once | INTRAVENOUS | Status: AC
Start: 2017-02-15 — End: 2017-02-15
  Administered 2017-02-15: 3.375 g via INTRAVENOUS
  Filled 2017-02-15: qty 50

## 2017-02-15 MED ORDER — ASPIRIN EC 81 MG PO TBEC
81.0000 mg | DELAYED_RELEASE_TABLET | Freq: Every day | ORAL | Status: DC
  Administered 2017-02-16 – 2017-02-18 (×3): 81 mg via ORAL
  Filled 2017-02-15 (×3): qty 1

## 2017-02-15 MED ORDER — CHLORHEXIDINE GLUCONATE 0.12 % MT SOLN
15.0000 mL | Freq: Two times a day (BID) | OROMUCOSAL | Status: DC
  Administered 2017-02-15 – 2017-02-18 (×6): 15 mL via OROMUCOSAL
  Filled 2017-02-15 (×6): qty 15

## 2017-02-15 MED ORDER — HYPROMELLOSE (GONIOSCOPIC) 2.5 % OP SOLN
1.0000 [drp] | Freq: Two times a day (BID) | OPHTHALMIC | Status: DC
  Filled 2017-02-15: qty 15

## 2017-02-15 MED ORDER — ONDANSETRON HCL 4 MG PO TABS: 4.0000 mg | ORAL_TABLET | Freq: Four times a day (QID) | ORAL | Status: DC | PRN

## 2017-02-15 MED ORDER — POLYVINYL ALCOHOL 1.4 % OP SOLN
1.0000 [drp] | Freq: Two times a day (BID) | OPHTHALMIC | Status: DC
  Administered 2017-02-15 – 2017-02-18 (×6): 1 [drp] via OPHTHALMIC
  Filled 2017-02-15: qty 15

## 2017-02-15 MED ORDER — FLAVOXATE HCL 100 MG PO TABS
100.0000 mg | ORAL_TABLET | Freq: Three times a day (TID) | ORAL | Status: DC | PRN
  Administered 2017-02-18: 100 mg via ORAL
  Filled 2017-02-15: qty 1

## 2017-02-15 MED ORDER — SODIUM CHLORIDE 0.9 % IV BOLUS (SEPSIS)
1000.0000 mL | Freq: Once | INTRAVENOUS | Status: AC
  Administered 2017-02-15: 1000 mL via INTRAVENOUS

## 2017-02-15 MED ORDER — SODIUM CHLORIDE 0.9 % IV BOLUS (SEPSIS)
1000.0000 mL | Freq: Once | INTRAVENOUS | Status: AC
Start: 2017-02-15 — End: 2017-02-15
  Administered 2017-02-15: 1000 mL via INTRAVENOUS

## 2017-02-15 MED ORDER — SODIUM CHLORIDE 0.9 % IV SOLN
INTRAVENOUS | Status: DC
  Administered 2017-02-15 – 2017-02-16 (×2): via INTRAVENOUS

## 2017-02-15 MED ORDER — DEXTROSE 5 % IV SOLN: 2.0000 g | Freq: Once | INTRAVENOUS | Status: DC

## 2017-02-15 MED ORDER — DEXTROSE 5 % IV SOLN
1.0000 g | INTRAVENOUS | Status: DC
  Administered 2017-02-15 – 2017-02-16 (×2): 1 g via INTRAVENOUS
  Filled 2017-02-15 (×3): qty 1

## 2017-02-15 MED ORDER — ATORVASTATIN CALCIUM 40 MG PO TABS
40.0000 mg | ORAL_TABLET | Freq: Every evening | ORAL | Status: DC
  Administered 2017-02-15 – 2017-02-17 (×3): 40 mg via ORAL
  Filled 2017-02-15 (×3): qty 1

## 2017-02-15 MED ORDER — VANCOMYCIN HCL IN DEXTROSE 1-5 GM/200ML-% IV SOLN
1000.0000 mg | Freq: Once | INTRAVENOUS | Status: AC
  Administered 2017-02-15: 1000 mg via INTRAVENOUS
  Filled 2017-02-15: qty 200

## 2017-02-15 NOTE — ED Notes (Signed)
ED TO INPATIENT HANDOFF REPORT  Name/Age/Gender Ronald [name] 83 y.o. male  Code Status    Code Status Orders  (From admission, onward)        Start     Ordered   02/15/17 1351  Full code  Continuous     02/15/17 1356    Code Status History    Date Active Date Inactive Code Status Order ID Comments User Context   01/21/2016 15:27 01/29/2016 13:16 Full Code [id number]  Flora Lipps Inpatient   01/21/2016 15:27 01/21/2016 15:27 Full Code [id number]  Bary Leriche, PA-C Inpatient   01/14/2016 12:45 01/21/2016 15:14 Full Code [id number]  Marijean Heath, NP ED   10/14/2015 00:00 10/16/2015 17:36 Full Code [id number]  Rise Patience, MD Inpatient   12/05/2014 03:04 12/07/2014 17:45 Full Code [id number]  Etta Quill, DO Inpatient   10/03/2014 16:14 10/06/2014 15:49 Full Code [id number]  Caren Griffins, MD ED   09/10/2014 01:11 09/12/2014 16:27 Full Code [id number]  Etta Quill, DO ED   01/02/2014 12:32 01/07/2014 19:19 Full Code [id number]  Jonetta Osgood, MD ED   08/28/2013 14:29 08/29/2013 15:00 Full Code [id number]  Belva Crome, MD Inpatient   08/27/2013 14:49 08/28/2013 14:29 Full Code [id number]  Isaiah Serge, NP Inpatient   05/20/2012 20:07 05/22/2012 17:33 Full Code [id number]  Janell Quiet, MD Inpatient    Advance Directive Documentation     Most Recent Value  Type of Advance Directive  Healthcare Power of Attorney, Living will  Pre-existing out of facility DNR order (yellow form or pink MOST form)  No data  "MOST" Form in Place?  No data      Home/SNF/Other Home  Chief Complaint fever/catheter issue  Level of Care/Admitting Diagnosis ED Disposition    ED Disposition Condition [facility] Area: [place] [[id number]]  Level of Care: Stepdown [14]  Admit to SDU based on following criteria: Hemodynamic compromise or significant risk of instability:  Patient requiring short term acute titration and management of vasoactive  drips, and invasive monitoring (i.e., CVP and Arterial line).  Diagnosis: Sepsis [facility]) [[id number]]  Admitting Physician: Junius Argyle  Attending Physician: Junius Argyle  Estimated length of stay: past midnight tomorrow  Certification:: I certify this patient will need inpatient services for at least 2 midnights  PT Class (Do Not Modify): Inpatient [101]  PT Acc Code (Do Not Modify): Private [1]       Medical History Past Medical History:  Diagnosis Date  . Arthritis    "joints ache" (01/02/2014)  . At risk for sleep apnea    STOP-BANG= 4    SENT TO PCP 12-23-2013  . Bladder calculi   . CHF (congestive heart failure) ([place])   . Chronic cystitis   . Coronary artery disease CARDIOLOGIST-  DR Grace Bushy  MI -- S/P  11/89 CABG x6 (70% circ; 90% PD; 60-70% distal left main; 30% left circ; 90% 1st diag;, 2nd diag and 3rd diag. w/90%,  mild stenosis LAD and 60-70% pLAD)  Re-do CABG x5 in 1997  . Diverticulosis   . GERD (gastroesophageal reflux disease)   . History of Bell's palsy    RIGHT SIDE-- NO RESIDUAL  . Hx of dizziness   . Hyperlipidemia   . Hypertension    "not anymore" (01/02/2014)  . Ischemic cardiomyopathy    ef 35-40% per cath 08-28-2013  . Kidney stones "years ago"   "  passed them"  . Melanoma of ear ([place])    "right"  . Mild dementia   . Myocardial infarction ([place]) 1986; 1997  . Nocturia   . OAB (overactive bladder)   . Prostate cancer ([place]) 1998   S/P  [place]; [place]  . S/P CABG (coronary artery bypass graft)    [place]  . Skipped heart beats    occasional  . Urethral stricture   . UTI (urinary tract infection) 09/2015  . Wears hearing aid    bilateral  . Wears partial dentures     Allergies Allergies  Allergen Reactions  . Pantoprazole Other (See Comments)    Headache and lightheaded  . Nitrofurantoin Hives  . Sulfa Antibiotics Hives and Itching  . Lidocaine     Mouth swelling      IV Location/Drains/Wounds Patient Lines/Drains/Airways Status   Active Line/Drains/Airways    Name:   Placement date:   Placement time:   Site:   Days:   Peripheral IV 02/15/17 Right Forearm   02/15/17    1152    Forearm   less than 1   Peripheral IV 02/15/17 Left Hand   02/15/17    1228    Hand   less than 1   Suprapubic Catheter Latex 16 Fr.   01/15/16    1246    Latex   397   Incision (Closed) 12/22/14 Abdomen Other (Comment)   12/22/14    0818     786          Labs/Imaging Results for orders placed or performed during the hospital encounter of 02/15/17 (from the past 48 hour(s))  Comprehensive metabolic panel     Status: Abnormal   Collection Time: 02/15/17 11:44 AM  Result Value Ref Range   Sodium 138 135 - 145 mmol/L   Potassium 3.8 3.5 - 5.1 mmol/L   Chloride 104 101 - 111 mmol/L   CO2 24 22 - 32 mmol/L   Glucose, Bld 164 (H) 65 - 99 mg/dL   BUN 26 (H) 6 - 20 mg/dL   Creatinine, Ser 1.35 (H) 0.61 - 1.24 mg/dL   Calcium 9.4 8.9 - 10.3 mg/dL   Total Protein 6.9 6.5 - 8.1 g/dL   Albumin 4.0 3.5 - 5.0 g/dL   AST 32 15 - 41 U/L   ALT 21 17 - 63 U/L   Alkaline Phosphatase 72 38 - 126 U/L   Total Bilirubin 1.6 (H) 0.3 - 1.2 mg/dL   GFR calc non Af Amer 46 (L) >60 mL/min   GFR calc Af Amer 53 (L) >60 mL/min    Comment: (NOTE) The eGFR has been calculated using the CKD EPI equation. This calculation has not been validated in all clinical situations. eGFR's persistently <60 mL/min signify possible Chronic Kidney Disease.    Anion gap 10 5 - 15  CBC with Differential     Status: Abnormal   Collection Time: 02/15/17 11:44 AM  Result Value Ref Range   WBC 16.0 (H) 4.0 - 10.5 K/uL   RBC 4.45 4.22 - 5.81 MIL/uL   Hemoglobin 14.0 13.0 - 17.0 g/dL   HCT 41.1 39.0 - 52.0 %   MCV 92.4 78.0 - 100.0 fL   MCH 31.5 26.0 - 34.0 pg   MCHC 34.1 30.0 - 36.0 g/dL   RDW 13.5 11.5 - 15.5 %   Platelets 111 (L) 150 - 400 K/uL    Comment: REPEATED  TO VERIFY SPECIMEN CHECKED FOR  CLOTS PLATELET COUNT CONFIRMED BY SMEAR    Neutrophils Relative % 87 %   Lymphocytes Relative 7 %   Monocytes Relative 6 %   Eosinophils Relative 0 %   Basophils Relative 0 %   Neutro Abs 13.9 (H) 1.7 - 7.7 K/uL   Lymphs Abs 1.1 0.7 - 4.0 K/uL   Monocytes Absolute 1.0 0.1 - 1.0 K/uL   Eosinophils Absolute 0.0 0.0 - 0.7 K/uL   Basophils Absolute 0.0 0.0 - 0.1 K/uL   Smear Review MORPHOLOGY UNREMARKABLE   Protime-INR     Status: None   Collection Time: 02/15/17 11:44 AM  Result Value Ref Range   Prothrombin Time 14.2 11.4 - 15.2 seconds   INR 1.11   APTT     Status: None   Collection Time: 02/15/17 11:44 AM  Result Value Ref Range   aPTT 28 24 - 36 seconds  I-Stat CG4 Lactic Acid, ED     Status: Abnormal   Collection Time: 02/15/17 11:57 AM  Result Value Ref Range   Lactic Acid, Venous 2.67 (HH) 0.5 - 1.9 mmol/L   Comment NOTIFIED PHYSICIAN   Urinalysis, Routine w reflex microscopic     Status: Abnormal   Collection Time: 02/15/17 12:10 PM  Result Value Ref Range   Color, Urine YELLOW YELLOW   APPearance CLOUDY (A) CLEAR   Specific Gravity, Urine 1.015 1.005 - 1.030   pH 7.0 5.0 - 8.0   Glucose, UA 150 (A) NEGATIVE mg/dL   Hgb urine dipstick MODERATE (A) NEGATIVE   Bilirubin Urine NEGATIVE NEGATIVE   Ketones, ur NEGATIVE NEGATIVE mg/dL   Protein, ur 30 (A) NEGATIVE mg/dL   Nitrite POSITIVE (A) NEGATIVE   Leukocytes, UA LARGE (A) NEGATIVE   RBC / HPF 6-30 0 - 5 RBC/hpf   WBC, UA TOO NUMEROUS TO COUNT 0 - 5 WBC/hpf   Bacteria, UA MANY (A) NONE SEEN   Squamous Epithelial / LPF 0-5 (A) NONE SEEN   WBC Clumps PRESENT    Mucus PRESENT   I-Stat CG4 Lactic Acid, ED  (not at  [facility])     Status: None   Collection Time: 02/15/17  2:23 PM  Result Value Ref Range   Lactic Acid, Venous 1.49 0.5 - 1.9 mmol/L   Dg Chest Port 1 View  Result Date: 02/15/2017 CLINICAL DATA:  Sepsis. EXAM: PORTABLE CHEST 1 VIEW COMPARISON:  Chest x-ray dated January 15, 2016. FINDINGS: Postsurgical  changes related to prior CABG. Stable cardiomediastinal silhouette. Normal pulmonary vascularity. Bibasilar atelectasis with probable small left pleural effusion. No pneumothorax. No acute osseous abnormality. IMPRESSION: Bibasilar atelectasis with small left pleural effusion. No consolidation. Electronically Signed   By: Titus Dubin M.D.   On: 02/15/2017 13:23    Pending Labs Unresulted Labs (From admission, onward)   Start     Ordered   02/22/17 0500  Creatinine, serum  (enoxaparin (LOVENOX)    CrCl < 30 ml/min)  Weekly,   R    Comments:  while on enoxaparin therapy.    02/15/17 1356   02/16/17 3545  Basic metabolic panel  Tomorrow morning,   R     02/15/17 1356   02/16/17 0500  CBC  Tomorrow morning,   R     02/15/17 1356   02/15/17 1600  Lactic acid, plasma  STAT Now then every 3 hours,   STAT     02/15/17 1356   02/15/17 1247  Blood Culture (routine x 2)  BLOOD CULTURE X 2,   STAT     02/15/17 1247      Vitals/Pain Today's Vitals   02/15/17 1527 02/15/17 1530 02/15/17 1540 02/15/17 1600  BP:  (!) 118/50  (!) 128/109  Pulse:  86  (!) 101  Resp:  14  (!) 31  Temp:   97.6 F (36.4 C)   TempSrc:   Oral   SpO2:  98%  95%  Weight:      Height:      PainSc: 10-Worst pain ever 10-Worst pain ever      Isolation Precautions No active isolations  Medications Medications  atorvastatin (LIPITOR) tablet 40 mg (not administered)  flavoxATE (URISPAS) tablet 100 mg (not administered)  hydroxypropyl methylcellulose / hypromellose (ISOPTO TEARS / GONIOVISC) 2.5 % ophthalmic solution 1 drop (not administered)  aspirin EC tablet 81 mg (not administered)  enoxaparin (LOVENOX) injection 30 mg (not administered)  sodium chloride flush (NS) 0.9 % injection 3 mL (not administered)  acetaminophen (TYLENOL) tablet 650 mg (650 mg Oral Given 02/15/17 1611)    Or  acetaminophen (TYLENOL) suppository 650 mg ( Rectal See Alternative 02/15/17 1611)  ondansetron (ZOFRAN) tablet 4 mg (not  administered)    Or  ondansetron (ZOFRAN) injection 4 mg (not administered)  0.9 %  sodium chloride infusion ( Intravenous New Bag/Given 02/15/17 1432)  ceFEPIme (MAXIPIME) 1 g in dextrose 5 % 50 mL IVPB (not administered)  sodium chloride 0.9 % bolus 1,000 mL (0 mLs Intravenous Stopped 02/15/17 1348)    And  sodium chloride 0.9 % bolus 1,000 mL (0 mLs Intravenous Stopped 02/15/17 1507)    And  sodium chloride 0.9 % bolus 250 mL (0 mLs Intravenous Stopped 02/15/17 1428)  piperacillin-tazobactam (ZOSYN) IVPB 3.375 g (0 g Intravenous Stopped 02/15/17 1336)  vancomycin (VANCOCIN) IVPB 1000 mg/200 mL premix (0 mg Intravenous Stopped 02/15/17 1428)    Mobility walks

## 2017-02-15 NOTE — Progress Notes (Signed)
Pharmacy Antibiotic Note  Ronald [name] is a 83 y.o. male admitted on 02/15/2017 with UTI.  Pharmacy has been consulted for cefepime dosing.  Plan: Cefepime 1g IV q24 for UTI per current renal function   Height: 5\' 5"  (165.1 cm) Weight: 160 lb (72.6 kg) IBW/kg (Calculated) : 61.5  Temp (24hrs), Avg:98.8 F (37.1 C), Min:98.2 F (36.8 C), Max:99.3 F (37.4 C)  Recent Labs  Lab 02/15/17 1144 02/15/17 1157  WBC 16.0*  --   CREATININE 1.35*  --   LATICACIDVEN  --  2.67*    Estimated Creatinine Clearance: 33.5 mL/min (A) (by C-G formula based on SCr of 1.35 mg/dL (H)).    Allergies  Allergen Reactions  . Pantoprazole Other (See Comments)    Headache and lightheaded  . Nitrofurantoin Hives  . Sulfa Antibiotics Hives and Itching  . Lidocaine     Mouth swelling     Thank you for allowing pharmacy to be a part of this patient's care.   Adrian Saran, PharmD, BCPS Pager [phone] 02/15/2017 2:10 PM

## 2017-02-15 NOTE — ED Notes (Signed)
Family at bedside. 

## 2017-02-15 NOTE — ED Notes (Signed)
Bed: WA04 Expected date:  Expected time:  Means of arrival:  Comments: EMS/fever/foley cath

## 2017-02-15 NOTE — Progress Notes (Signed)
Pharmacy Note   A consult was received from an ED physician for vancomycin and Zosyn per pharmacy dosing.   The patient's profile has been reviewed for ht/wt/allergies/indication/available labs.    A one time order has been placed for vancomycin 1000 mg IV x 1 and Zosyn 3.375 gr IV x1 .  Further antibiotics/pharmacy consults should be ordered by admitting physician if indicated.                       Thank you,   Royetta Asal, PharmD, BCPS Pager [phone] 02/15/2017 12:56 PM

## 2017-02-15 NOTE — ED Notes (Signed)
Chest xray at bedside.

## 2017-02-15 NOTE — ED Notes (Signed)
Hospitalist at bedside 

## 2017-02-15 NOTE — ED Notes (Signed)
Set of blood cultures drawn with initial lab work.

## 2017-02-15 NOTE — ED Triage Notes (Signed)
Per [facility] pt from home reports being seen at [facility] ED yesterday to have suprapubic catheter replaced. Pt now c/o fever and chills. Wife gave pt 1 gram tylenol at 11 am PTA.

## 2017-02-15 NOTE — H&P (Signed)
History and Physical  Ronald [name]:[id number] DOB: 10-08-1927 DOA: 02/15/2017  Referring physician: Thalia Party, ER physician  PCP: Marletta Lor, MD  Outpatient Specialists: Rana Snare, urology Patient coming from: Home & is able to ambulate without assistance  Chief Complaint: Problems urinating   HPI: Ronald [name] is a 83 y.o. male with medical history significant for chronic systolic heart failure, CAD status post CABG and history of prostate cancer with chronic suprapubic catheter who had presented to emergency room about a day and a half ago for urinary retention. At that time, suprapubic catheter replaced. Patient was feeling a little better at first, but then started having fevers and chills and was brought back into the emergency room.  He had a similar episode a year ago for urosepsis.    ED Course: In the emergency room, patient noted to have borderline low blood pressures, acute kidney injury and lactic acid level 2.67.  White count elevated at 16.0. Chest x-ray unremarkable, but urinalysis noted very large urinary tract infection. Patient treated as sepsis with protocol initiated with aggressive IV fluid resuscitation and antibiotics.  Initially he was given a dose of Zosyn in the emergency room. Hospitalists were called for further evaluation and admission. A follow-up lactic acid level approximately 4 hours later had improved at 1.46 with stable blood pressures.  Review of Systems: Patient seen in the emergency room . Pt complains of chills and rigors. He also feels very tired.   Pt denies any headaches, vision changes, dysphagia, chest pain, palpitations, shortness of breath, wheeze, cough, abdominal pain, hematuria, constipation, diarrhea, focal extremity numbness weakness or pain .  Review of systems are otherwise negative   Past Medical History:  Diagnosis Date  . Arthritis    "joints ache" (01/02/2014)  . At risk for sleep apnea    STOP-BANG= 4     SENT TO PCP 12-23-2013  . Bladder calculi   . CHF (congestive heart failure) ([employer])   . Chronic cystitis   . Coronary artery disease CARDIOLOGIST-  DR Grace Bushy  [place] -- S/P  11/89 CABG x6 (70% circ; 90% PD; 60-70% distal left main; 30% left circ; 90% 1st diag;, 2nd diag and 3rd diag. w/90%,  mild stenosis LAD and 60-70% pLAD)  Re-do CABG x5 in 1997  . Diverticulosis   . GERD (gastroesophageal reflux disease)   . History of Bell's palsy    RIGHT SIDE-- NO RESIDUAL  . Hx of dizziness   . Hyperlipidemia   . Hypertension    "not anymore" (01/02/2014)  . Ischemic cardiomyopathy    ef 35-40% per cath 08-28-2013  . Kidney stones "years ago"   "passed them"  . Melanoma of ear ([place])    "right"  . Mild dementia   . Myocardial infarction ([place]) 1986; 1997  . Nocturia   . OAB (overactive bladder)   . Prostate cancer ([place]) 1998   S/P  [place]; [place]  . S/P CABG (coronary artery bypass graft)    [place]  . Skipped heart beats    occasional  . Urethral stricture   . UTI (urinary tract infection) 09/2015  . Wears hearing aid    bilateral  . Wears partial dentures    Past Surgical History:  Procedure Laterality Date  . CARDIAC CATHETERIZATION  02-22-2005  dr Vidal Schwalbe   mild to moderate lv dysfunction with inferobasilar akinesis/  totally occluded SVG to Intermediate Diagonal  and SVG to PDA and RCA branches, totally occluded native coronary circulation with diffuse disease pLAD diagonal system with potentially could be ischemic/  patent SVG to OM with collaterals to dRCA and patent LIMA to LAD and diagonal systemss  . CARDIAC CATHETERIZATION  08-28-2013  DR Daneen Schick   widely patent sequential left internal graft to the diagonal/LAD, widely patent SVG to OM with proximal 50% narrowing noted in the graft, total occlusion SVG's to RCA, RI, and the Diagonal/ LV dysfunction with inferobasal aneurysm and mid anterior wall region of akinesis/  overall EF  35-40%/  Total occlusion of the navtive circulation/  no significant change compared to 2006 cath  . CARDIOVASCULAR STRESS TEST  08-01-2011  dr Angelena Form   inferior scar and possible soft tissue attenuation with minimal peri-infarct ischemia, small region of anterior ischemia and scar/  LVEF 53% LV wall motion with inferior hypokinesis/ no significant change from scan july 2011  . CATARACT EXTRACTION W/ INTRAOCULAR LENS  IMPLANT, BILATERAL Bilateral 2007  . CORONARY ARTERY BYPASS GRAFT  11/ [place]-- 6 vessel/  1997 Re-do 5 vessel  . CYSTOSCOPY WITH URETHRAL DILATATION N/A 12/25/2013   Procedure: CYSTOSCOPY WITH URETHRAL DILATATION, WITH BIOPSY;  Surgeon: Bernestine Amass, MD;  Location: [facility];  Service: Urology;  Laterality: N/A;  . CYSTOSCOPY WITH URETHRAL DILATATION N/A 12/22/2014   Procedure: CYSTOSCOPY WITH URETHRAL DILATATION;  Surgeon: Rana Snare, MD;  Location: [facility];  Service: Urology;  Laterality: N/A;  BALLOON DILATION CATHETER    . EUS  10/05/2011   Procedure: ESOPHAGEAL ENDOSCOPIC ULTRASOUND (EUS) RADIAL;  Surgeon: Arta Silence, MD;  Location: [facility];  Service: Endoscopy;  Laterality: N/A;  . INSERTION OF SUPRAPUBIC CATHETER N/A 12/22/2014   Procedure: INSERTION OF SUPRAPUBIC CATHETER ;  Surgeon: Rana Snare, MD;  Location: [facility];  Service: Urology;  Laterality: N/A;  . LAPAROSCOPIC CHOLECYSTECTOMY  12-23-2005  . LEFT HEART CATHETERIZATION WITH CORONARY/GRAFT ANGIOGRAM N/A 08/28/2013   Procedure: LEFT HEART CATHETERIZATION WITH Beatrix Fetters;  Surgeon: Sinclair Grooms, MD;  Location: [facility] CATH LAB;  Service: Cardiovascular;  Laterality: N/A;  . MELANOMA EXCISION  X 1   "ear"  . RADIOACTIVE PROSTATE SEED IMPLANTS  1998  . SKIN CANCER EXCISION  X 2   "top of head"  . TONSILLECTOMY AND ADENOIDECTOMY  1954    Social History:  reports that he quit smoking about 33 years ago. His smoking use included cigars. He quit after 40.00  years of use. He quit smokeless tobacco use about 3 years ago. His smokeless tobacco use included chew. He reports that he does not drink alcohol or use drugs.   Allergies  Allergen Reactions  . Pantoprazole Other (See Comments)    Headache and lightheaded  . Nitrofurantoin Hives  . Sulfa Antibiotics Hives and Itching  . Lidocaine     Mouth swelling     Family History  Problem Relation Age of Onset  . Heart disease Mother   . Diabetes Father   . Heart disease Brother       Prior to Admission medications   Medication Sig Start Date End Date Taking? Authorizing Provider  acetaminophen (TYLENOL) 500 MG tablet Take 1,000 mg by mouth every 6 (six) hours as needed for pain or fever.    Yes [provider]  aspirin EC 81 MG EC tablet Take 1 tablet (81 mg total) by mouth daily. 08/29/13  Yes Consuelo Pandy, PA-C  atorvastatin (LIPITOR) 80 MG tablet Take 1 tablet (80 mg total) by mouth every evening. Patient taking differently: Take 40 mg by mouth every evening.  01/21/16  Yes Tat, Shanon Brow, MD  Calcium Carbonate Antacid (TUMS PO) Take 3 tablets by mouth daily as needed (gas).   Yes [provider]  Cholecalciferol (VITAMIN D) 2000 UNITS tablet Take 2,000 Units by mouth daily.   Yes [provider]  flavoxATE (URISPAS) 100 MG tablet Take 100 mg by mouth 3 (three) times daily as needed for bladder spasms.   Yes [provider]  furosemide (LASIX) 20 MG tablet Take 1 tablet (20 mg total) by mouth daily as needed. For swelling 05/18/16 02/15/17 Yes Burnell Blanks, MD  galantamine (RAZADYNE ER) 8 MG 24 hr capsule Take 8 mg by mouth daily with breakfast.   Yes [provider]  hydroxypropyl methylcellulose (ISOPTO TEARS) 2.5 % ophthalmic solution Place 1 drop into both eyes 2 (two) times daily.    Yes [provider]  Multiple Vitamin (MULTIVITAMIN WITH MINERALS) TABS Take 1 tablet by mouth daily.   Yes [provider]    ranitidine (ZANTAC) 150 MG tablet Take 1 tablet (150 mg total) by mouth 2 (two) times daily. 05/18/16  Yes Burnell Blanks, MD  nitroGLYCERIN (NITROSTAT) 0.4 MG SL tablet Place 0.4 mg under the tongue every 5 (five) minutes as needed for chest pain.     [provider]    Physical Exam: BP (!) 108/47 (BP Location: Left Arm)   Pulse 87   Temp 98.2 F (36.8 C) (Oral)   Resp (!) 24   Ht 5' 5"  (1.651 m)   Wt 77.3 kg (170 lb 6.7 oz)   SpO2 92%   BMI 28.36 kg/m   General:  Alert and oriented 3, shaking from rigors  Eyes: Sclera nonicteric, extraocular movements are intact  ENT: Normocephalic, atraumatic, mucous members are slightly dry  Neck: Supple, no JVD  Cardiovascular: Regular rate and rhythm, C1-U3, 2/6 systolic ejection murmur  Respiratory: Clear auscultation bilaterally  Abdomen: Soft, nontender, nondistended, hypoactive bowel sounds, noted suprapubic catheter in place  Skin: Other than catheter, no skin breaks, tears or lesions  Musculoskeletal: No clubbing or cyanosis, trace pitting edema  Psychiatric: Appropriate, no evidence of psychoses  Neurologic: No focal deficits           Labs on Admission:  Basic Metabolic Panel: Recent Labs  Lab 02/15/17 1144  NA 138  K 3.8  CL 104  CO2 24  GLUCOSE 164*  BUN 26*  CREATININE 1.35*  CALCIUM 9.4   Liver Function Tests: Recent Labs  Lab 02/15/17 1144  AST 32  ALT 21  ALKPHOS 72  BILITOT 1.6*  PROT 6.9  ALBUMIN 4.0   No results for input(s): LIPASE, AMYLASE in the last 168 hours. No results for input(s): AMMONIA in the last 168 hours. CBC: Recent Labs  Lab 02/15/17 1144  WBC 16.0*  NEUTROABS 13.9*  HGB 14.0  HCT 41.1  MCV 92.4  PLT 111*   Cardiac Enzymes: No results for input(s): CKTOTAL, CKMB, CKMBINDEX, TROPONINI in the last 168 hours.  BNP (last 3 results) No results for input(s): BNP in the last 8760 hours.  ProBNP (last 3 results) No results for input(s): PROBNP in the last  8760 hours.  CBG: No results for input(s): GLUCAP in the last 168 hours.  Radiological Exams on Admission: Dg Chest Port 1 View  Result Date: 02/15/2017 CLINICAL DATA:  Sepsis. EXAM: PORTABLE  CHEST 1 VIEW COMPARISON:  Chest x-ray dated January 15, 2016. FINDINGS: Postsurgical changes related to prior CABG. Stable cardiomediastinal silhouette. Normal pulmonary vascularity. Bibasilar atelectasis with probable small left pleural effusion. No pneumothorax. No acute osseous abnormality. IMPRESSION: Bibasilar atelectasis with small left pleural effusion. No consolidation. Electronically Signed   By: Titus Dubin M.D.   On: 02/15/2017 13:23    EKG: Independently reviewed.  Sinus rhythm, few premature PACs, prolonged PR  Assessment/Plan Present on Admission: . HTN (hypertension): Blood pressure stable, holding meds secondary to sepsis  . Chronic systolic CHF (congestive heart failure) ([place]): Last echocardiogram done October 2017 noting decreased ejection fraction of 40-45 percent. Patient RBC needs to be aggressively fluid resuscitated for sepsis. Given improvement lactic acidosis, was titrated down IV fluids, but not discontinue. Check morning BNP and follow daily weights and strict input and output. I suspect he will likely need diuresis once  sepsis stabilized prior to discharge  . HYPERCHOLESTEROLEMIA  . Sepsis secondary to Urinary tract infection associated with cystostomy catheter ([place]): Started on cefepime. Patient ago had positive blood cultures for Citrobacter and Pseudomonas, both resistant to Rocephin and Zosyn, but sensitive to Cipro and cefepime. We'll wait for urine and blood cultures to grow out. Patient met criteria for sepsis on admission given lactic acidosis, leukocytosis, urinary source, borderline hypotension and an organ injury with acute kidney injury. Sepsis looks to be greatly improving with lactic acid level resolving. Will titrate down IV fluids given systolic heart  failure.  CAD: Stable  . AKI (acute kidney injury) [facility]): Secondary sepsis.  Principal Problem:   Sepsis ([place]) Active Problems:   HYPERCHOLESTEROLEMIA   HTN (hypertension)   PROSTATE CANCER, HX OF   Chronic systolic CHF (congestive heart failure) (HCC)   Urinary tract infection associated with cystostomy catheter (H[facility])   Suprapubic catheter (HCC)   AKI (acute kidney injury) ([place])   DVT prophylaxis: Lovenox  Code Status: Full code as per patient   Family Communication: Wife and daughter at the bedside   Disposition Plan: This patient will be here for several days until sepsis fully stabilized, urine cultures back and not to volume overloaded   Consults called: None   Admission status: Given need for acute Hospital services past 2 nights, admit as inpatient     Annita Brod MD [facility] Pager [phone]  If 7PM-7AM, please contact night-coverage [email or web address] Password [facility]  02/15/2017, 6:26 PM

## 2017-02-15 NOTE — ED Notes (Signed)
ED Provider at bedside. 

## 2017-02-15 NOTE — ED Notes (Signed)
2nd set of blood cultures drawn with IV start. Waiting for MD to see patient.

## 2017-02-16 DIAGNOSIS — Z9359 Other cystostomy status: Secondary | ICD-10-CM

## 2017-02-16 DIAGNOSIS — I5022 Chronic systolic (congestive) heart failure: Secondary | ICD-10-CM

## 2017-02-16 DIAGNOSIS — T83510A Infection and inflammatory reaction due to cystostomy catheter, initial encounter: Principal | ICD-10-CM

## 2017-02-16 DIAGNOSIS — N179 Acute kidney failure, unspecified: Secondary | ICD-10-CM

## 2017-02-16 DIAGNOSIS — Z8546 Personal history of malignant neoplasm of prostate: Secondary | ICD-10-CM

## 2017-02-16 DIAGNOSIS — E7800 Pure hypercholesterolemia, unspecified: Secondary | ICD-10-CM

## 2017-02-16 DIAGNOSIS — N390 Urinary tract infection, site not specified: Secondary | ICD-10-CM

## 2017-02-16 DIAGNOSIS — A419 Sepsis, unspecified organism: Secondary | ICD-10-CM

## 2017-02-16 LAB — CBC
HCT: 38.3 % — ABNORMAL LOW (ref 39.0–52.0)
Hemoglobin: 12.4 g/dL — ABNORMAL LOW (ref 13.0–17.0)
MCH: 30.4 pg (ref 26.0–34.0)
MCHC: 32.4 g/dL (ref 30.0–36.0)
MCV: 93.9 fL (ref 78.0–100.0)
Platelets: 91 10*3/uL — ABNORMAL LOW (ref 150–400)
RBC: 4.08 MIL/uL — ABNORMAL LOW (ref 4.22–5.81)
RDW: 13.9 % (ref 11.5–15.5)
WBC: 14 10*3/uL — ABNORMAL HIGH (ref 4.0–10.5)

## 2017-02-16 LAB — BASIC METABOLIC PANEL
Anion gap: 7 (ref 5–15)
BUN: 27 mg/dL — ABNORMAL HIGH (ref 6–20)
CO2: 23 mmol/L (ref 22–32)
Calcium: 8.5 mg/dL — ABNORMAL LOW (ref 8.9–10.3)
Chloride: 112 mmol/L — ABNORMAL HIGH (ref 101–111)
Creatinine, Ser: 1.23 mg/dL (ref 0.61–1.24)
GFR calc Af Amer: 59 mL/min — ABNORMAL LOW (ref >60)
GFR calc non Af Amer: 51 mL/min — ABNORMAL LOW (ref >60)
Glucose, Bld: 110 mg/dL — ABNORMAL HIGH (ref 65–99)
Potassium: 3.9 mmol/L (ref 3.5–5.1)
Sodium: 142 mmol/L (ref 135–145)

## 2017-02-16 LAB — BRAIN NATRIURETIC PEPTIDE: B Natriuretic Peptide: 351.6 pg/mL — ABNORMAL HIGH (ref 0.0–100.0)

## 2017-02-16 LAB — LACTIC ACID, PLASMA: Lactic Acid, Venous: 1.3 mmol/L (ref 0.5–1.9)

## 2017-02-16 MED ORDER — GALANTAMINE HYDROBROMIDE 4 MG PO TABS
4.0000 mg | ORAL_TABLET | Freq: Two times a day (BID) | ORAL | Status: DC
  Administered 2017-02-16 – 2017-02-18 (×5): 4 mg via ORAL
  Filled 2017-02-16 (×7): qty 1

## 2017-02-16 MED ORDER — GALANTAMINE HYDROBROMIDE ER 8 MG PO CP24
8.0000 mg | ORAL_CAPSULE | Freq: Every day | ORAL | Status: DC
  Filled 2017-02-16: qty 1

## 2017-02-16 MED ORDER — SODIUM CHLORIDE 0.9 % IV BOLUS (SEPSIS)
250.0000 mL | Freq: Once | INTRAVENOUS | Status: AC
  Administered 2017-02-16: 250 mL via INTRAVENOUS

## 2017-02-16 MED ORDER — SIMETHICONE 40 MG/0.6ML PO SUSP
80.0000 mg | Freq: Four times a day (QID) | ORAL | Status: DC | PRN
  Filled 2017-02-16: qty 1.2

## 2017-02-16 MED ORDER — VITAMINS A & D EX OINT
TOPICAL_OINTMENT | CUTANEOUS | Status: AC
  Administered 2017-02-16: 5
  Filled 2017-02-16: qty 5

## 2017-02-16 NOTE — Progress Notes (Signed)
PROGRESS NOTE  Ronald [name]  BMW:[id number] DOB: October 04, 1927 DOA: 02/15/2017 PCP: Marletta Lor, MD   Brief Narrative: Ronald [name] is a 83 y.o. male with medical history significant for chronic systolic heart failure, CAD status post CABG and history of prostate cancer with chronic suprapubic catheter who had presented to emergency room about a day and a half ago for urinary retention. At that time, suprapubic catheter replaced. Patient was feeling a little better at first, but then started having fevers and chills and was brought back into the emergency room.  He had a similar episode a year ago for urosepsis.    ED Course: In the emergency room, patient noted to have borderline low blood pressures, acute kidney injury and lactic acid level 2.67.  White count elevated at 16.0. Chest x-ray unremarkable, but urinalysis noted very large urinary tract infection. Patient treated as sepsis with protocol initiated with aggressive IV fluid resuscitation and antibiotics.  Initially he was given a dose of Zosyn in the emergency room. Hospitalists were called for further evaluation and admission. A follow-up lactic acid level approximately 4 hours later had improved at 1.46 with stable blood pressures.  Assessment & Plan: Principal Problem:   Sepsis ([place]) Active Problems:   HYPERCHOLESTEROLEMIA   HTN (hypertension)   PROSTATE CANCER, HX OF   Chronic systolic CHF (congestive heart failure) (H[facility])   Urinary tract infection associated with cystostomy catheter (HCC)   Suprapubic catheter (H[facility])   AKI (acute kidney injury) ([place])  Sepsis due to complicated UTI: Improving, lactic acid cleared. WBC modestly improving.  - Continue cefepime monotherapy based on prior urine culture data (hx citrobacter and pseudomonas).  - s/p sepsis IVF's and still hypotensive but not tachycardic. - Monitor urine and blood cultures. Prior UTIs also sensitive to cipro.  Chronic systolic CHF: Echo Oct 2725 w/EF  40-45%.  - Monitor closely for volume overload due to sepsis-protocol IV fluids. BNP elevated at 351. No clinical evidence of volume overload at this time.  - Strict I/O and daily weights - Holding lasix (which he takes prn) for now.  - Repeat echocardiogram  AKI: Due to sepsis, mildly improved. Suspect baseline Cr 0.9 - 1.1, though no recent records.  - Monitor UOP, I/O  CAD s/p CABG and ischemic cardiomyopathy: Chronic, stable, no chest pain.  - Continue ASA, statin. Not on beta blocker due to bradycardia, off imdur and lisinopril due to hypotension.   History of prostate CA and urethral stricture with suprapubic catheterization: Prsented to ED for clogged catheter 11/20 and this was replaced by EDP.  - Continue flavoxate prn  Hypercholesterolemia: Chronic, stable.  - Continue PPI  Mild cognitive impairment:  - Continue home galantamine. Pt requested this.  DVT prophylaxis: Lovenox Code Status: Full Family Communication: Wife at bedside Disposition Plan: Continue inpatient management, plan to DC to home once improving. Will order therapy consults.  Consultants:   None  Procedures:   Echocardiogram ordered  Antimicrobials:   Vancomycin/zosyn 11/21   Cefepime 11/21 >>   Subjective: No fevers, but had some chills last night. Denies chest pain, dyspnea. No abdominal pain.  Objective: Vitals:   02/16/17 0226 02/16/17 0411 02/16/17 1259 02/16/17 1302  BP: (!) 94/43 (!) 110/47 (!) 93/40 (!) 95/39  Pulse: (!) 55 60 (!) 59 (!) 57  Resp:  20 19 18   Temp:  98 F (36.7 C) 98.1 F (36.7 C) 98.1 F (36.7 C)  TempSrc:  Oral Oral Oral  SpO2:  100% 96% 96%  Weight:      Height:        Intake/Output Summary (Last 24 hours) at 02/16/2017 1503 Last data filed at 02/16/2017 1300 Gross per 24 hour  Intake 2158.34 ml  Output 1200 ml  Net 958.34 ml   Filed Weights   02/15/17 1134 02/15/17 1812  Weight: 72.6 kg (160 lb) 77.3 kg (170 lb 6.7 oz)    Gen: Pleasant,  elderly male in no distress Pulm: Non-labored breathing. Clear to auscultation bilaterally without crackles.  CV: Regular borderline bradycardia. No murmur, rub, or gallop. No JVD, no pedal edema. GI: Abdomen soft, non-tender, non-distended, with normoactive bowel sounds. No organomegaly or masses felt. Ext: Warm, no deformities Skin: Suprapubic catheter in place without significant erythema and no discharge.  Neuro: Alert and oriented. No focal neurological deficits. Psych: Judgement and insight appear normal. Mood & affect appropriate.   Data Reviewed: I have personally reviewed following labs and imaging studies  CBC: Recent Labs  Lab 02/15/17 1144 02/16/17 0458  WBC 16.0* 14.0*  NEUTROABS 13.9*  --   HGB 14.0 12.4*  HCT 41.1 38.3*  MCV 92.4 93.9  PLT 111* 91*   Basic Metabolic Panel: Recent Labs  Lab 02/15/17 1144 02/16/17 0458  NA 138 142  K 3.8 3.9  CL 104 112*  CO2 24 23  GLUCOSE 164* 110*  BUN 26* 27*  CREATININE 1.35* 1.23  CALCIUM 9.4 8.5*   GFR: Estimated Creatinine Clearance: 40.6 mL/min (by C-G formula based on SCr of 1.23 mg/dL). Liver Function Tests: Recent Labs  Lab 02/15/17 1144  AST 32  ALT 21  ALKPHOS 72  BILITOT 1.6*  PROT 6.9  ALBUMIN 4.0   No results for input(s): LIPASE, AMYLASE in the last 168 hours. No results for input(s): AMMONIA in the last 168 hours. Coagulation Profile: Recent Labs  Lab 02/15/17 1144  INR 1.11   Cardiac Enzymes: No results for input(s): CKTOTAL, CKMB, CKMBINDEX, TROPONINI in the last 168 hours. BNP (last 3 results) No results for input(s): PROBNP in the last 8760 hours. HbA1C: No results for input(s): HGBA1C in the last 72 hours. CBG: No results for input(s): GLUCAP in the last 168 hours. Lipid Profile: No results for input(s): CHOL, HDL, LDLCALC, TRIG, CHOLHDL, LDLDIRECT in the last 72 hours. Thyroid Function Tests: No results for input(s): TSH, T4TOTAL, FREET4, T3FREE, THYROIDAB in the last 72  hours. Anemia Panel: No results for input(s): VITAMINB12, FOLATE, FERRITIN, TIBC, IRON, RETICCTPCT in the last 72 hours. Urine analysis:    Component Value Date/Time   COLORURINE YELLOW 02/15/2017 1210   APPEARANCEUR CLOUDY (A) 02/15/2017 1210   LABSPEC 1.015 02/15/2017 1210   PHURINE 7.0 02/15/2017 1210   GLUCOSEU 150 (A) 02/15/2017 1210   HGBUR MODERATE (A) 02/15/2017 1210   BILIRUBINUR NEGATIVE 02/15/2017 1210   BILIRUBINUR neg 06/30/2014 1530   KETONESUR NEGATIVE 02/15/2017 1210   PROTEINUR 30 (A) 02/15/2017 1210   UROBILINOGEN 0.2 12/04/2014 2344   NITRITE POSITIVE (A) 02/15/2017 1210   LEUKOCYTESUR LARGE (A) 02/15/2017 1210   Recent Results (from the past 240 hour(s))  Blood Culture (routine x 2)     Status: None (Preliminary result)   Collection Time: 02/15/17  6:33 PM  Result Value Ref Range Status   Specimen Description BLOOD LEFT ANTECUBITAL  Final   Special Requests   Final    BOTTLES DRAWN AEROBIC ONLY Blood Culture adequate volume   Culture   Final    NO GROWTH < 24 HOURS Performed at [facility], [place] [address] [place], [place] [address]    Report Status PENDING  Incomplete  Blood Culture (routine x 2)     Status: None (Preliminary result)   Collection Time: 02/15/17  6:37 PM  Result Value Ref Range Status   Specimen Description BLOOD LEFT ARM  Final   Special Requests   Final    BOTTLES DRAWN AEROBIC ONLY Blood Culture adequate volume   Culture   Final    NO GROWTH < 24 HOURS Performed at [facility], [place] [address], [place], [place] [address]    Report Status PENDING  Incomplete      Radiology Studies: Dg Chest Port 1 View  Result Date: 02/15/2017 CLINICAL DATA:  Sepsis. EXAM: PORTABLE CHEST 1 VIEW COMPARISON:  Chest x-ray dated January 15, 2016. FINDINGS: Postsurgical changes related to prior CABG. Stable cardiomediastinal silhouette. Normal pulmonary vascularity. Bibasilar atelectasis with probable small left pleural effusion. No  pneumothorax. No acute osseous abnormality. IMPRESSION: Bibasilar atelectasis with small left pleural effusion. No consolidation. Electronically Signed   By: Titus Dubin M.D.   On: 02/15/2017 13:23    Scheduled Meds: . aspirin EC  81 mg Oral Daily  . atorvastatin  40 mg Oral QPM  . chlorhexidine  15 mL Mouth Rinse BID  . enoxaparin (LOVENOX) injection  40 mg Subcutaneous Q24H  . galantamine  4 mg Oral BID WC  . mouth rinse  15 mL Mouth Rinse q12n4p  . polyvinyl alcohol  1 drop Both Eyes BID  . sodium chloride flush  3 mL Intravenous Q12H   Continuous Infusions: . ceFEPime (MAXIPIME) IV Stopped (02/15/17 1931)     LOS: 1 day   Time spent: 25 minutes.  Vance Gather, MD [facility] Pager [phone]  If 7PM-7AM, please contact night-coverage [email or web address] Password [facility] 02/16/2017, 3:03 PM

## 2017-02-16 NOTE — Progress Notes (Signed)
Notified Dr. Bonner Puna of patient's soft BP in the 90s. Not new order given, will continue to assess patient.

## 2017-02-16 NOTE — Progress Notes (Signed)
Pt hypotensive BP 90/40. On call provider Schorr made aware. New orders placed for 250 cc bolus. Follow up BP 110/47. Will continue to monitor closely.

## 2017-02-17 ENCOUNTER — Inpatient Hospital Stay (HOSPITAL_COMMUNITY): Payer: Medicare Other

## 2017-02-17 DIAGNOSIS — I361 Nonrheumatic tricuspid (valve) insufficiency: Secondary | ICD-10-CM

## 2017-02-17 LAB — ECHOCARDIOGRAM COMPLETE
Height: 65 in
Weight: 2723.2 oz

## 2017-02-17 LAB — BASIC METABOLIC PANEL
Anion gap: 4 — ABNORMAL LOW (ref 5–15)
BUN: 32 mg/dL — ABNORMAL HIGH (ref 6–20)
CO2: 24 mmol/L (ref 22–32)
Calcium: 8.4 mg/dL — ABNORMAL LOW (ref 8.9–10.3)
Chloride: 112 mmol/L — ABNORMAL HIGH (ref 101–111)
Creatinine, Ser: 1.18 mg/dL (ref 0.61–1.24)
GFR calc Af Amer: >60 mL/min (ref >60)
GFR calc non Af Amer: 54 mL/min — ABNORMAL LOW (ref >60)
Glucose, Bld: 108 mg/dL — ABNORMAL HIGH (ref 65–99)
Potassium: 3.5 mmol/L (ref 3.5–5.1)
Sodium: 140 mmol/L (ref 135–145)

## 2017-02-17 LAB — CBC
HCT: 35.3 % — ABNORMAL LOW (ref 39.0–52.0)
Hemoglobin: 11.7 g/dL — ABNORMAL LOW (ref 13.0–17.0)
MCH: 30.9 pg (ref 26.0–34.0)
MCHC: 33.1 g/dL (ref 30.0–36.0)
MCV: 93.1 fL (ref 78.0–100.0)
Platelets: 87 10*3/uL — ABNORMAL LOW (ref 150–400)
RBC: 3.79 MIL/uL — ABNORMAL LOW (ref 4.22–5.81)
RDW: 13.8 % (ref 11.5–15.5)
WBC: 6.7 10*3/uL (ref 4.0–10.5)

## 2017-02-17 LAB — URINE CULTURE: Culture: NO GROWTH

## 2017-02-17 MED ORDER — CEFEPIME HCL 2 G IJ SOLR
2.0000 g | INTRAMUSCULAR | Status: DC
  Administered 2017-02-17: 2 g via INTRAVENOUS
  Filled 2017-02-17: qty 2

## 2017-02-17 MED ORDER — PERFLUTREN LIPID MICROSPHERE
INTRAVENOUS | Status: AC
  Filled 2017-02-17: qty 10

## 2017-02-17 MED ORDER — PERFLUTREN LIPID MICROSPHERE
1.0000 mL | INTRAVENOUS | Status: DC | PRN
  Administered 2017-02-17: 4 mL via INTRAVENOUS
  Filled 2017-02-17: qty 10

## 2017-02-17 NOTE — Progress Notes (Signed)
Pharmacy Antibiotic Note  Ronald [name] is a 83 y.o. male with chronic suprapubic cath presented to the ED on 02/15/2017 with c/o fever and chills.  Cefepime started on admission for suspected UTI.  Today, 02/17/2017: - Tmax 100.7, wbc down wnl - scr trending down 1.18 (crcl~42) - all cultures have been negative thus far  Plan: - adjust cefepime to 2gm IV q24h - f/u scr and cultures _________________________________________  Height: 5\' 5"  (165.1 cm) Weight: 170 lb 3.2 oz (77.2 kg) IBW/kg (Calculated) : 61.5  Temp (24hrs), Avg:98.8 F (37.1 C), Min:98.1 F (36.7 C), Max:100.7 F (38.2 C)  Recent Labs  Lab 02/15/17 1144 02/15/17 1157 02/15/17 1423 02/16/17 0458 02/17/17 0431  WBC 16.0*  --   --  14.0* 6.7  CREATININE 1.35*  --   --  1.23 1.18  LATICACIDVEN  --  2.67* 1.49 1.3  --     Estimated Creatinine Clearance: 42.3 mL/min (by C-G formula based on SCr of 1.18 mg/dL).    Allergies  Allergen Reactions  . Pantoprazole Other (See Comments)    Headache and lightheaded  . Nitrofurantoin Hives  . Sulfa Antibiotics Hives and Itching  . Lidocaine     Mouth swelling     Antimicrobials this admission: 11/21 vanc x 1 11/21 Zosyn x 1 11/21 cefepime >>  Dose adjustments this admission: --  Microbiology results: 11/21 BCx x2: 11/22 ucx: neg FINAL  Thank you for allowing pharmacy to be a part of this patient's care.  Dia Sitter P 02/17/2017 9:00 AM

## 2017-02-17 NOTE — Evaluation (Signed)
Physical Therapy Evaluation Patient Details Name: Ronald [name] MRN: [id number] DOB: 1928-02-20 Today's Date: 02/17/2017   History of Present Illness  83 y.o. male with medical history significant for chronic systolic heart failure, CAD status post CABG and history of prostate cancer with chronic suprapubic catheter who had presented to emergency room about a day and a half ago for urinary retention. At that time, suprapubic catheter replaced.  Pt returned with fever/chills and admitted 02/15/17 for sepsis  Clinical Impression  Pt admitted with above diagnosis. Pt currently with functional limitations due to the deficits listed below (see PT Problem List).  Pt will benefit from skilled PT to increase their independence and safety with mobility to allow discharge to the venue listed below.  Pt reports feeling weak however able to tolerate good distance of ambulation in hallway with RW.  Pt agreeable to ambulate with staff during acute stay.     Follow Up Recommendations Home health PT;Supervision for mobility/OOB    Equipment Recommendations  None recommended by PT    Recommendations for Other Services       Precautions / Restrictions Precautions Precautions: Fall Precaution Comments: suprapubic catheter      Mobility  Bed Mobility Overal bed mobility: Needs Assistance Bed Mobility: Supine to Sit     Supine to sit: Min guard;HOB elevated     General bed mobility comments: utilized bed rail  Transfers Overall transfer level: Needs assistance Equipment used: Rolling walker (2 wheeled) Transfers: Sit to/from Stand Sit to Stand: Min guard         General transfer comment: min/guard for safety, cues for hand placement  Ambulation/Gait Ambulation/Gait assistance: Min assist Ambulation Distance (Feet): 120 Feet Assistive device: Rolling walker (2 wheeled) Gait Pattern/deviations: Step-through pattern;Decreased stride length     General Gait Details: verbal cues for RW  positioning, especially with turning, pt's feet moved outside RW twice and this would cause LOB requiring min assist  Stairs            Wheelchair Mobility    Modified Rankin (Stroke Patients Only)       Balance Overall balance assessment: History of Falls;Needs assistance         Standing balance support: Bilateral upper extremity supported Standing balance-Leahy Scale: Poor                               Pertinent Vitals/Pain Pain Assessment: No/denies pain    Home Living Family/patient expects to be discharged to:: Private residence Living Arrangements: Spouse/significant other   Type of Home: House Home Access: Stairs to enter Entrance Stairs-Rails: Left Entrance Stairs-Number of Steps: 3 Home Layout: One level Home Equipment: Walker - 2 wheels;Cane - single point      Prior Function Level of Independence: Independent               Hand Dominance        Extremity/Trunk Assessment        Lower Extremity Assessment Lower Extremity Assessment: Generalized weakness    Cervical / Trunk Assessment Cervical / Trunk Assessment: Kyphotic  Communication   Communication: HOH  Cognition Arousal/Alertness: Awake/alert Behavior During Therapy: WFL for tasks assessed/performed Overall Cognitive Status: Within Functional Limits for tasks assessed  General Comments      Exercises     Assessment/Plan    PT Assessment Patient needs continued PT services  PT Problem List Decreased strength;Decreased mobility;Decreased activity tolerance;Decreased balance;Decreased knowledge of use of DME       PT Treatment Interventions DME instruction;Therapeutic activities;Gait training;Therapeutic exercise;Functional mobility training;Patient/family education;Balance training    PT Goals (Current goals can be found in the Care Plan section)  Acute Rehab PT Goals PT Goal Formulation: With  patient Time For Goal Achievement: 03/03/17 Potential to Achieve Goals: Good    Frequency Min 3X/week   Barriers to discharge        Co-evaluation               AM-PAC PT "6 Clicks" Daily Activity  Outcome Measure Difficulty turning over in bed (including adjusting bedclothes, sheets and blankets)?: None Difficulty moving from lying on back to sitting on the side of the bed? : A Lot Difficulty sitting down on and standing up from a chair with arms (e.g., wheelchair, bedside commode, etc,.)?: Unable Help needed moving to and from a bed to chair (including a wheelchair)?: A Little Help needed walking in hospital room?: A Little Help needed climbing 3-5 steps with a railing? : A Lot 6 Click Score: 15    End of Session   Activity Tolerance: Patient tolerated treatment well Patient left: in chair;with call bell/phone within reach Nurse Communication: Mobility status PT Visit Diagnosis: Difficulty in walking, not elsewhere classified (R26.2)    Time: 7001-7494 PT Time Calculation (min) (ACUTE ONLY): 16 min   Charges:   PT Evaluation $PT Eval Low Complexity: 1 Low     PT G CodesCarmelia Bake, PT, DPT 02/17/2017 Pager: [phone]  York Ram E 02/17/2017, 11:46 AM

## 2017-02-17 NOTE — Progress Notes (Signed)
PROGRESS NOTE  Ronald [name]  NGE:[id number] DOB: Jun 27, 1927 DOA: 02/15/2017 PCP: Marletta Lor, MD   Brief Narrative: Ronald [name] is a 83 y.o. male with medical history significant for chronic systolic heart failure, CAD status post CABG and history of prostate cancer with chronic suprapubic catheter who had presented to emergency room about a day and a half ago for urinary retention. At that time, suprapubic catheter replaced. Patient was feeling a little better at first, but then started having fevers and chills and was brought back into the emergency room.  He had a similar episode a year ago for urosepsis.    ED Course: In the emergency room, patient noted to have borderline low blood pressures, acute kidney injury and lactic acid level 2.67.  White count elevated at 16.0. Chest x-ray unremarkable, but urinalysis noted very large urinary tract infection. Patient treated as sepsis with protocol initiated with aggressive IV fluid resuscitation and antibiotics.  Initially he was given a dose of Zosyn in the emergency room. Hospitalists were called for further evaluation and admission. A follow-up lactic acid level approximately 4 hours later had improved at 1.46 with stable blood pressures.  Assessment & Plan: Principal Problem:   Sepsis ([place]) Active Problems:   HYPERCHOLESTEROLEMIA   HTN (hypertension)   PROSTATE CANCER, HX OF   Chronic systolic CHF (congestive heart failure) (H[facility])   Urinary tract infection associated with cystostomy catheter ([facility])   Suprapubic catheter ([facility])   AKI (acute kidney injury) ([place])  Sepsis due to complicated UTI: Improving, lactic acid cleared. Leukocytosis resolved. Febrile overnight. - Continue cefepime monotherapy based on prior urine culture data (hx citrobacter and pseudomonas). Prior UTIs also sensitive to cipro. No urine culture sent on initial urine specimen. Culture sent from later specimen post-abx is negative. - Monitor blood  cultures.  Chronic systolic CHF: Echo Oct 4010 w/EF 40-45%, though updated echo 11/23 with improvement in EF to 27-25%, no diastolic dysfunction or wall motion abnormalities.  - Monitor closely for volume overload due to sepsis-protocol IV fluids. BNP elevated at 351. No clinical evidence of volume overload at this time.  - Strict I/O and daily weights - Holding lasix (which he takes prn) for now.   AKI: Due to sepsis, improved. Suspect baseline Cr 0.9 - 1.1, though no recent records.  - Appreciate pharmacy assistance with renal dosing. - Monitor UOP, I/O  CAD s/p CABG and ischemic cardiomyopathy: Chronic, stable, no chest pain.  - Continue ASA, statin. Not on beta blocker due to bradycardia, off imdur and lisinopril due to hypotension.   History of prostate CA and urethral stricture with suprapubic catheterization: Prsented to ED for clogged catheter 11/20 and this was replaced by EDP.  - Continue flavoxate prn  Hypercholesterolemia: Chronic, stable.  - Continue PPI  Mild cognitive impairment:  - Continue home galantamine.  DVT prophylaxis: Lovenox Code Status: Full Family Communication: None at bedside Disposition Plan: Continue inpatient management, plan to DC to home once improving, afebrile at least 24 hours.  Consultants:   None  Procedures:   Echocardiogram 02/17/2017: - Left ventricle: septal hypokinesis Moderate basal septal   hypertrophy. The cavity size was normal. Systolic function was   normal. The estimated ejection fraction was in the range of 50%   to 55%. Wall motion was normal; there were no regional wall   motion abnormalities. Left ventricular diastolic function   parameters were normal. - Left atrium: The atrium was mildly dilated. - Atrial septum: No defect or patent foramen  ovale was identified.  Impressions: - There was no evidence of a vegetation.  Antimicrobials:   Vancomycin/zosyn 11/21   Cefepime 11/21 >>   Subjective: Reports having  an episode of visual hallucination last night associated with a fever. Feels fine this morning after getting tylenol. No abdominal or back pain.   Objective: Vitals:   02/16/17 2024 02/16/17 2330 02/17/17 0036 02/17/17 0529  BP: (!) 133/53 (!) 149/62  (!) 133/93  Pulse: 61 (!) 56  (!) 51  Resp: 18 18  15   Temp: 99 F (37.2 C) (!) 100.7 F (38.2 C) 98.5 F (36.9 C) 98.4 F (36.9 C)  TempSrc: Oral Oral Oral Oral  SpO2: 95% 96%  98%  Weight:    77.2 kg (170 lb 3.2 oz)  Height:        Intake/Output Summary (Last 24 hours) at 02/17/2017 1437 Last data filed at 02/17/2017 0600 Gross per 24 hour  Intake 50 ml  Output 1300 ml  Net -1250 ml   Filed Weights   02/15/17 1134 02/15/17 1812 02/17/17 0529  Weight: 72.6 kg (160 lb) 77.3 kg (170 lb 6.7 oz) 77.2 kg (170 lb 3.2 oz)    Gen: Pleasant, elderly male in no distress Pulm: Non-labored breathing. Clear to auscultation bilaterally without crackles.  CV: Regular borderline bradycardia. No murmur, rub, or gallop. No JVD, no pedal edema. GI: Abdomen soft, non-tender, non-distended, with normoactive bowel sounds. No organomegaly or masses felt. Ext: Warm, no deformities Skin: Suprapubic catheter in place without significant erythema and no discharge.  Neuro: Alert and oriented this morning. No focal neurological deficits. Psych: Judgement and insight appear intact. Mood & affect appropriate.   Data Reviewed: I have personally reviewed following labs and imaging studies  CBC: Recent Labs  Lab 02/15/17 1144 02/16/17 0458 02/17/17 0431  WBC 16.0* 14.0* 6.7  NEUTROABS 13.9*  --   --   HGB 14.0 12.4* 11.7*  HCT 41.1 38.3* 35.3*  MCV 92.4 93.9 93.1  PLT 111* 91* 87*   Basic Metabolic Panel: Recent Labs  Lab 02/15/17 1144 02/16/17 0458 02/17/17 0431  NA 138 142 140  K 3.8 3.9 3.5  CL 104 112* 112*  CO2 24 23 24   GLUCOSE 164* 110* 108*  BUN 26* 27* 32*  CREATININE 1.35* 1.23 1.18  CALCIUM 9.4 8.5* 8.4*   GFR: Estimated  Creatinine Clearance: 42.3 mL/min (by C-G formula based on SCr of 1.18 mg/dL). Liver Function Tests: Recent Labs  Lab 02/15/17 1144  AST 32  ALT 21  ALKPHOS 72  BILITOT 1.6*  PROT 6.9  ALBUMIN 4.0   No results for input(s): LIPASE, AMYLASE in the last 168 hours. No results for input(s): AMMONIA in the last 168 hours. Coagulation Profile: Recent Labs  Lab 02/15/17 1144  INR 1.11   Cardiac Enzymes: No results for input(s): CKTOTAL, CKMB, CKMBINDEX, TROPONINI in the last 168 hours. BNP (last 3 results) No results for input(s): PROBNP in the last 8760 hours. HbA1C: No results for input(s): HGBA1C in the last 72 hours. CBG: No results for input(s): GLUCAP in the last 168 hours. Lipid Profile: No results for input(s): CHOL, HDL, LDLCALC, TRIG, CHOLHDL, LDLDIRECT in the last 72 hours. Thyroid Function Tests: No results for input(s): TSH, T4TOTAL, FREET4, T3FREE, THYROIDAB in the last 72 hours. Anemia Panel: No results for input(s): VITAMINB12, FOLATE, FERRITIN, TIBC, IRON, RETICCTPCT in the last 72 hours. Urine analysis:    Component Value Date/Time   COLORURINE YELLOW 02/15/2017 1210   APPEARANCEUR CLOUDY (  A) 02/15/2017 1210   LABSPEC 1.015 02/15/2017 1210   PHURINE 7.0 02/15/2017 1210   GLUCOSEU 150 (A) 02/15/2017 1210   HGBUR MODERATE (A) 02/15/2017 1210   BILIRUBINUR NEGATIVE 02/15/2017 1210   BILIRUBINUR neg 06/30/2014 1530   KETONESUR NEGATIVE 02/15/2017 1210   PROTEINUR 30 (A) 02/15/2017 1210   UROBILINOGEN 0.2 12/04/2014 2344   NITRITE POSITIVE (A) 02/15/2017 1210   LEUKOCYTESUR LARGE (A) 02/15/2017 1210   Recent Results (from the past 240 hour(s))  Blood Culture (routine x 2)     Status: None (Preliminary result)   Collection Time: 02/15/17  6:33 PM  Result Value Ref Range Status   Specimen Description BLOOD LEFT ANTECUBITAL  Final   Special Requests   Final    BOTTLES DRAWN AEROBIC ONLY Blood Culture adequate volume   Culture   Final    NO GROWTH 2  DAYS Performed at [facility], [place] [address], [place], [place] [address]    Report Status PENDING  Incomplete  Blood Culture (routine x 2)     Status: None (Preliminary result)   Collection Time: 02/15/17  6:37 PM  Result Value Ref Range Status   Specimen Description BLOOD LEFT ARM  Final   Special Requests   Final    BOTTLES DRAWN AEROBIC ONLY Blood Culture adequate volume   Culture   Final    NO GROWTH 2 DAYS Performed at [facility], [place] [address], [place], [place] [address]    Report Status PENDING  Incomplete  Culture, Urine     Status: None   Collection Time: 02/16/17  9:59 AM  Result Value Ref Range Status   Specimen Description URINE, RANDOM  Final   Special Requests NONE  Final   Culture   Final    NO GROWTH Performed at [facility], [place] [address], [place], [place] [address]    Report Status 02/17/2017 FINAL  Final      Radiology Studies: No results found.  Scheduled Meds: . aspirin EC  81 mg Oral Daily  . atorvastatin  40 mg Oral QPM  . chlorhexidine  15 mL Mouth Rinse BID  . enoxaparin (LOVENOX) injection  40 mg Subcutaneous Q24H  . galantamine  4 mg Oral BID WC  . mouth rinse  15 mL Mouth Rinse q12n4p  . polyvinyl alcohol  1 drop Both Eyes BID  . sodium chloride flush  3 mL Intravenous Q12H   Continuous Infusions: . ceFEPime (MAXIPIME) IV       LOS: 2 days   Time spent: 25 minutes.  Vance Gather, MD [facility] Pager [phone]  If 7PM-7AM, please contact night-coverage [email or web address] Password [facility] 02/17/2017, 2:37 PM

## 2017-02-17 NOTE — Progress Notes (Signed)
  Echocardiogram 2D Echocardiogram with Definity has been performed.  Darlina Sicilian M 02/17/2017, 9:43 AM

## 2017-02-17 NOTE — Evaluation (Signed)
Occupational Therapy Evaluation Patient Details Name: Ronald [name] MRN: [id number] DOB: 02-25-1928 Today's Date: 02/17/2017    History of Present Illness 83 y.o. male with medical history significant for chronic systolic heart failure, CAD status post CABG and history of prostate cancer with chronic suprapubic catheter who had presented to emergency room about a day and a half ago for urinary retention. At that time, suprapubic catheter replaced.  Pt returned with fever/chills and admitted 02/15/17 for sepsis   Clinical Impression   Pt was admitted for the above. He reports that he feels he never gained all strength back since a year ago. He was able to get outside and work and could sit on a 5 gallon bucket when tired. He occasionally had some help for LB adls.  He needs min A for transfers and up to mod A for LB adls at this time. He is very unsteady with turns using RW and will benefit from continued OT to increase strength, balance and safety with adls. Goals are for supervision level in acute    Follow Up Recommendations  Home health OT;Supervision/Assistance - 24 hour    Equipment Recommendations  None recommended by OT(has high toilet; will further assess tub)    Recommendations for Other Services       Precautions / Restrictions Precautions Precautions: Fall Precaution Comments: suprapubic catheter Restrictions Weight Bearing Restrictions: No      Mobility Bed Mobility Overal bed mobility: Needs Assistance Bed Mobility: Supine to Sit     Supine to sit: Min guard;HOB elevated Sit to supine: Min guard   General bed mobility comments: assist for catheter  Transfers Overall transfer level: Needs assistance Equipment used: Rolling walker (2 wheeled) Transfers: Sit to/from Stand Sit to Stand: Min guard         General transfer comment: cues for UE placement    Balance Overall balance assessment: History of Falls;Needs assistance         Standing balance  support: Bilateral upper extremity supported Standing balance-Leahy Scale: Poor                             ADL either performed or assessed with clinical judgement   ADL Overall ADL's : Needs assistance/impaired Eating/Feeding: Independent   Grooming: Min guard;Wash/dry hands;Standing   Upper Body Bathing: Set up;Sitting   Lower Body Bathing: Minimal assistance;Sit to/from stand   Upper Body Dressing : Set up;Sitting   Lower Body Dressing: Moderate assistance;Sit to/from stand   Toilet Transfer: Minimal assistance;Ambulation;RW(back to bed)   Toileting- Clothing Manipulation and Hygiene: Moderate assistance;Sit to/from stand         General ADL Comments: ambulated to bathroom and helped pt with peri care.  Returned to bed.  Pt sat up in chair for more than 2 hours.  Reviewed taking lots of rest breaks to build endurance.  He has been doing leg exercises daily for a year     Vision         Perception     Praxis      Pertinent Vitals/Pain Pain Assessment: No/denies pain     Hand Dominance     Extremity/Trunk Assessment Upper Extremity Assessment Upper Extremity Assessment: Overall WFL for tasks assessed(grossly 4/5)   Lower Extremity Assessment Lower Extremity Assessment: Generalized weakness   Cervical / Trunk Assessment Cervical / Trunk Assessment: Kyphotic   Communication Communication Communication: HOH   Cognition Arousal/Alertness: Awake/alert Behavior During Therapy: WFL for tasks assessed/performed  Overall Cognitive Status: Within Functional Limits for tasks assessed                                     General Comments       Exercises     Shoulder Instructions      Home Living Family/patient expects to be discharged to:: Private residence Living Arrangements: Spouse/significant other Available Help at Discharge: Family Type of Home: House Home Access: Stairs to enter Technical brewer of Steps:  3 Entrance Stairs-Rails: Left Home Layout: One level     Bathroom Shower/Tub: Teacher, early years/pre: Handicapped height     Home Equipment: Environmental consultant - 2 wheels;Cane - single point          Prior Functioning/Environment Level of Independence: Independent with assistive device(s)        Comments: cane at times; likes to work in yard. Feels like his strength has not come back in a year        OT Problem List: Decreased strength;Decreased activity tolerance;Impaired balance (sitting and/or standing);Decreased safety awareness;Decreased knowledge of use of DME or AE      OT Treatment/Interventions: Self-care/ADL training;DME and/or AE instruction;Patient/family education;Balance training    OT Goals(Current goals can be found in the care plan section) Acute Rehab OT Goals Patient Stated Goal: get strength back OT Goal Formulation: With patient Time For Goal Achievement: 02/24/17 Potential to Achieve Goals: Good ADL Goals Pt Will Transfer to Toilet: with supervision;ambulating Pt Will Perform Toileting - Clothing Manipulation and hygiene: with supervision;sit to/from stand;sitting/lateral leans Additional ADL Goal #1: pt will perform LB adls wtih supervision, sit to stand Additional ADL Goal #2: pt will initate at least one rest break for energy conservation  OT Frequency: Min 2X/week   Barriers to D/C:            Co-evaluation              AM-PAC PT "6 Clicks" Daily Activity     Outcome Measure Help from another person eating meals?: None Help from another person taking care of personal grooming?: A Little Help from another person toileting, which includes using toliet, bedpan, or urinal?: A Little Help from another person bathing (including washing, rinsing, drying)?: A Little Help from another person to put on and taking off regular upper body clothing?: A Little Help from another person to put on and taking off regular lower body clothing?: A  Lot 6 Click Score: 18   End of Session    Activity Tolerance: Patient tolerated treatment well Patient left: in bed;with call bell/phone within reach;with bed alarm set;with family/visitor present  OT Visit Diagnosis: Muscle weakness (generalized) (M62.81);Unsteadiness on feet (R26.81)                Time: 3295-1884 OT Time Calculation (min): 21 min Charges:  OT General Charges $OT Visit: 1 Visit OT Evaluation $OT Eval Low Complexity: 1 Low G-Codes:     Lesle Chris, OTR/L [phone] 02/17/2017.  Bomani Oommen 02/17/2017, 1:56 PM

## 2017-02-18 ENCOUNTER — Other Ambulatory Visit: Payer: Self-pay

## 2017-02-18 DIAGNOSIS — I10 Essential (primary) hypertension: Secondary | ICD-10-CM

## 2017-02-18 LAB — CBC
HCT: 36.6 % — ABNORMAL LOW (ref 39.0–52.0)
Hemoglobin: 12.3 g/dL — ABNORMAL LOW (ref 13.0–17.0)
MCH: 30.8 pg (ref 26.0–34.0)
MCHC: 33.6 g/dL (ref 30.0–36.0)
MCV: 91.5 fL (ref 78.0–100.0)
Platelets: 103 10*3/uL — ABNORMAL LOW (ref 150–400)
RBC: 4 MIL/uL — ABNORMAL LOW (ref 4.22–5.81)
RDW: 13.4 % (ref 11.5–15.5)
WBC: 6.4 10*3/uL (ref 4.0–10.5)

## 2017-02-18 LAB — BASIC METABOLIC PANEL
Anion gap: 6 (ref 5–15)
BUN: 26 mg/dL — ABNORMAL HIGH (ref 6–20)
CO2: 23 mmol/L (ref 22–32)
Calcium: 8.4 mg/dL — ABNORMAL LOW (ref 8.9–10.3)
Chloride: 110 mmol/L (ref 101–111)
Creatinine, Ser: 1.07 mg/dL (ref 0.61–1.24)
GFR calc Af Amer: >60 mL/min (ref >60)
GFR calc non Af Amer: >60 mL/min (ref >60)
Glucose, Bld: 120 mg/dL — ABNORMAL HIGH (ref 65–99)
Potassium: 3.3 mmol/L — ABNORMAL LOW (ref 3.5–5.1)
Sodium: 139 mmol/L (ref 135–145)

## 2017-02-18 MED ORDER — CIPROFLOXACIN HCL 500 MG PO TABS: 500.0000 mg | ORAL_TABLET | Freq: Two times a day (BID) | ORAL | Status: DC

## 2017-02-18 MED ORDER — APIXABAN 5 MG PO TABS
5.0000 mg | ORAL_TABLET | Freq: Two times a day (BID) | ORAL | Status: DC
  Administered 2017-02-18: 5 mg via ORAL
  Filled 2017-02-18: qty 1

## 2017-02-18 MED ORDER — POTASSIUM CHLORIDE CRYS ER 20 MEQ PO TBCR
40.0000 meq | EXTENDED_RELEASE_TABLET | Freq: Four times a day (QID) | ORAL | Status: AC
  Administered 2017-02-18 (×2): 40 meq via ORAL
  Filled 2017-02-18 (×2): qty 2

## 2017-02-18 MED ORDER — CIPROFLOXACIN HCL 500 MG PO TABS: 500.0000 mg | ORAL_TABLET | Freq: Two times a day (BID) | ORAL | 0 refills | Status: DC

## 2017-02-18 MED ORDER — GUAIFENESIN-DM 100-10 MG/5ML PO SYRP
5.0000 mL | ORAL_SOLUTION | ORAL | Status: DC | PRN
  Administered 2017-02-18: 5 mL via ORAL
  Filled 2017-02-18: qty 10

## 2017-02-18 MED ORDER — APIXABAN 5 MG PO TABS: 5.0000 mg | ORAL_TABLET | Freq: Two times a day (BID) | ORAL | 0 refills | Status: DC

## 2017-02-18 NOTE — Progress Notes (Signed)
[place] for Eliquis Indication: new onset atrial fibrillation  Allergies  Allergen Reactions  . Pantoprazole Other (See Comments)    Headache and lightheaded  . Nitrofurantoin Hives  . Sulfa Antibiotics Hives and Itching  . Lidocaine     Mouth swelling     Patient Measurements: Height: 5\' 5"  (165.1 cm) Weight: 170 lb 13.7 oz (77.5 kg) IBW/kg (Calculated) : 61.5 Heparin Dosing Weight:  Vital Signs: Temp: 98.4 F (36.9 C) (11/24 0509) Temp Source: Oral (11/24 0509) BP: 134/66 (11/24 0509) Pulse Rate: 79 (11/24 0509)  Labs: Recent Labs    02/16/17 0458 02/17/17 0431 02/18/17 0455  HGB 12.4* 11.7* 12.3*  HCT 38.3* 35.3* 36.6*  PLT 91* 87* 103*  CREATININE 1.23 1.18 1.07    Estimated Creatinine Clearance: 46.7 mL/min (by C-G formula based on SCr of 1.07 mg/dL).   Assessment: Ronald [name] is a 83 y.o. male with chronic suprapubic cath presented to the ED on 02/15/2017 with c/o fever and chills. Cefepime started on admission for UTI. To change abx to cipro and start Eliquis for new onset afib (CHADSVASC 3) on 11/24.  Today, 02/18/2017: - day #4 abx - afeb, wbc wnl - scr down 1.07 - hgb stable, cbc low but trending up - no bleeding documented - pt's taking oral meds    Plan:  - eliquis 5 mg bid (for weight >60 kg and scr <1.5) - d/c lovenox SQ - monitor for s/s bleeding - d/c cefepime, start cipro 500 mg PO bid  - pharmacy will sign off for cipro   Ronald [name] P 02/18/2017,11:50 AM

## 2017-02-18 NOTE — Discharge Summary (Signed)
Physician Discharge Summary  Ronald [name] NKN:[id number] DOB: 11/08/1927 DOA: 02/15/2017  PCP: Marletta Lor, MD  Admit date: 02/15/2017 Discharge date: 02/18/2017  Admitted From: Home Disposition: Home   Recommendations for Outpatient Follow-up:  1. Follow up with PCP in 1-2 weeks 2. Please obtain BMP/CBC in one week.  3. Needs to follow up with cardiology. Started eliquis for new Dx AFib with CVR.  4. Follow up with urology 5. Follow up blood cultures from 11/21 (NGTD at discharge on 11/24).  Home Health: PT, OT Equipment/Devices: None new Discharge Condition: Stable CODE STATUS: Full Diet recommendation: Heart healthy  Brief/Interim Summary: Ronald [name] a 83 y.o.malewith medical history significant forchronic systolic heart failure, CAD status post CABG and history of prostate cancer with chronic suprapubic catheter who had presented to emergency room about a day and a half ago for urinary retention. At that time, suprapubic catheter replaced. Patient was feeling a little better at first, but then started having fevers and chills and was brought back into the emergency room. He had a similar episode a year ago for urosepsis.   ED Course:In the emergency room, patient noted to have borderline low blood pressures, acute kidney injury and lactic acid level2.67. White count elevated at 16.0. Chest x-ray unremarkable, but urinalysis noted very large urinary tract infection. Patient treated as sepsis with protocol initiated with aggressive IV fluid resuscitation and antibiotics. Initially he was given a dose of Zosyn in the emergency room. Hospitalists were called for further evaluation and admission. A follow-up lactic acid level approximately 4 hours later had improved at 1.46 with stable blood pressures.  Antibiotic therapy was continued with improvement. Telemetry monitoring picked up paroxysmal atrial fibrillation which has remained rate controlled. This was  reviewed with Dr. Caryl Comes who recommends anticoagulation and follow up with cardiology as an outpatient.  Discharge Diagnoses:  Principal Problem:   Sepsis ([place]) Active Problems:   HYPERCHOLESTEROLEMIA   HTN (hypertension)   PROSTATE CANCER, HX OF   Chronic systolic CHF (congestive heart failure) (HCC)   Urinary tract infection associated with cystostomy catheter (HCC)   Suprapubic catheter (H[facility])   AKI (acute kidney injury) ([place])  Sepsis due to complicated UTI: Improving, lactic acid cleared. Leukocytosis resolved. Afebrile >36 hours. - Continued cefepime monotherapy based on prior urine culture data (hx citrobacter and pseudomonas). No urine culture sent on initial urine specimen. Culture sent from later specimen post-abx is negative. - Prior UTIs also sensitive to cipro, so this was started. - Monitor blood cultures (NGTD at discharge)  Chronic systolic CHF: Echo Oct 3790 w/EF 40-45%, though updated echo 11/23 with improvement in EF to 24-09%, no diastolic dysfunction or wall motion abnormalities.  - Though BNP elevated at 351, there has been no clinical evidence of volume overload. - Can restart prn lasix.  Paroxysmal atrial fibrillation: Rate controlled without medications, intermittently also with 1st deg AV block, no prolonged pauses.  - Started eliquis 5mg  BID - Will need cardiology follow up.  AKI: Due to sepsis, improved. Suspect baseline Cr 0.9 - 1.1, and is 1.07 at discharge. - Monitor BMP at follow up.  CAD s/p CABG and ischemic cardiomyopathy: Chronic, stable, no chest pain.  - Continue ASA, statin. Not on beta blocker due to bradycardia, off imdur and lisinopril due to hypotension.   History of prostate CA and urethral stricture with suprapubic catheterization: Prsented to ED for clogged catheter 11/20 and this was replaced by EDP.  - Continue flavoxate prn  Hypercholesterolemia: Chronic, stable.  -  Continue PPI  Mild cognitive impairment:  - Continue home  galantamine.  Discharge Instructions Discharge Instructions    Diet - low sodium heart healthy   Complete by:  As directed    Discharge instructions   Complete by:  As directed    You were admitted for sepsis due to a urinary tract infection. This has improved with antibiotics which will be continued in the form of cipro twice daily for 7 more days (sent to your pharmacy).   Your heart rhythm is becoming irregular (atrial fibrillation) every once in a while, which increases your risk of stroke. This was discussed with cardiology, who advises that to reduce the risk of stroke, you should be started on a blood thinner. The safest blood thinner for you is eliquis, which you will take twice daily. A printed prescription is being provided at discharge for this.  - START taking eliquis to reduce risk of stroke from AFib.  - CALL your cardiologist to follow up in the next 1 - 2 weeks.   If your symptoms return, seek medical attention right away.   Increase activity slowly   Complete by:  As directed      Allergies as of 02/18/2017      Reactions   Pantoprazole Other (See Comments)   Headache and lightheaded   Nitrofurantoin Hives   Sulfa Antibiotics Hives, Itching   Lidocaine    Mouth swelling      Medication List    TAKE these medications   acetaminophen 500 MG tablet Commonly known as:  TYLENOL Take 1,000 mg by mouth every 6 (six) hours as needed for pain or fever.   apixaban 5 MG Tabs tablet Commonly known as:  ELIQUIS Take 1 tablet (5 mg total) by mouth 2 (two) times daily.   aspirin 81 MG EC tablet Take 1 tablet (81 mg total) by mouth daily.   atorvastatin 80 MG tablet Commonly known as:  LIPITOR Take 1 tablet (80 mg total) by mouth every evening. What changed:  how much to take   ciprofloxacin 500 MG tablet Commonly known as:  CIPRO Take 1 tablet (500 mg total) by mouth 2 (two) times daily.   flavoxATE 100 MG tablet Commonly known as:  URISPAS Take 100 mg by mouth  3 (three) times daily as needed for bladder spasms.   furosemide 20 MG tablet Commonly known as:  LASIX Take 1 tablet (20 mg total) by mouth daily as needed. For swelling   galantamine 8 MG 24 hr capsule Commonly known as:  RAZADYNE ER Take 8 mg by mouth daily with breakfast.   hydroxypropyl methylcellulose / hypromellose 2.5 % ophthalmic solution Commonly known as:  ISOPTO TEARS / GONIOVISC Place 1 drop into both eyes 2 (two) times daily.   multivitamin with minerals Tabs tablet Take 1 tablet by mouth daily.   nitroGLYCERIN 0.4 MG SL tablet Commonly known as:  NITROSTAT Place 0.4 mg under the tongue every 5 (five) minutes as needed for chest pain.   ranitidine 150 MG tablet Commonly known as:  ZANTAC Take 1 tablet (150 mg total) by mouth 2 (two) times daily.   TUMS PO Take 3 tablets by mouth daily as needed (gas).   Vitamin D 2000 units tablet Take 2,000 Units by mouth daily.      Follow-up Information    Marletta Lor, MD Follow up.   Specialty:  Internal Medicine Contact information: [place] [address] [phone]  Burnell Blanks, MD. Schedule an appointment as soon as possible for a visit in 2 week(s).   Specialty:  Cardiology Contact information: [address]  [place] [address] [phone]        [facility] Follow up.   Specialty:  Home Health Services Why:  home health physical therapy and occupational therapy-agency will call to arrange initial visit Contact information: [place] [address] [phone]          Allergies  Allergen Reactions  . Pantoprazole Other (See Comments)    Headache and lightheaded  . Nitrofurantoin Hives  . Sulfa Antibiotics Hives and Itching  . Lidocaine     Mouth swelling     Consultations:  Discussed case and reviewed telemetry with cardiology, Dr. Caryl Comes, 11/24.  Procedures/Studies: Dg Chest  Port 1 View  Result Date: 02/15/2017 CLINICAL DATA:  Sepsis. EXAM: PORTABLE CHEST 1 VIEW COMPARISON:  Chest x-ray dated January 15, 2016. FINDINGS: Postsurgical changes related to prior CABG. Stable cardiomediastinal silhouette. Normal pulmonary vascularity. Bibasilar atelectasis with probable small left pleural effusion. No pneumothorax. No acute osseous abnormality. IMPRESSION: Bibasilar atelectasis with small left pleural effusion. No consolidation. Electronically Signed   By: Titus Dubin M.D.   On: 02/15/2017 13:23     Echocardiogram 02/17/2017: - Left ventricle: septal hypokinesis Moderate basal septal hypertrophy. The cavity size was normal. Systolic function was normal. The estimated ejection fraction was in the range of 50% to 55%. Wall motion was normal; there were no regional wall motion abnormalities. Left ventricular diastolic function parameters were normal. - Left atrium: The atrium was mildly dilated. - Atrial septum: No defect or patent foramen ovale was identified.  Impressions: - There was no evidence of a vegetation.  Subjective: No fevers, delirium, or edema. No chest pain or palpitations, but did have about 30 minutes of trouble catching breath yesterday evening which subsided after he pulled his covers to his mouth and felt air he was breathing was warmer. No wheezes or current complaints.   Discharge Exam: Vitals:   02/17/17 2124 02/18/17 0509  BP: (!) 154/65 134/66  Pulse: (!) 54 79  Resp: 20 18  Temp: 99 F (37.2 C) 98.4 F (36.9 C)  SpO2: 96% 95%   General: Pleasant, elderly male in no distress Cardiovascular: Regular borderline bradycardia. No murmur, rub, or gallop. No JVD, no pedal edema. Respiratory: Non-labored breathing. Clear to auscultation bilaterally without crackles.  Abdominal: Soft, NT, ND, bowel sounds +. Suprapubic catheter in place without significant erythema and no discharge.   Labs: BNP (last 3 results) Recent Labs     02/16/17 0458  BNP 259.5*   Basic Metabolic Panel: Recent Labs  Lab 02/15/17 1144 02/16/17 0458 02/17/17 0431 02/18/17 0455  NA 138 142 140 139  K 3.8 3.9 3.5 3.3*  CL 104 112* 112* 110  CO2 24 23 24 23   GLUCOSE 164* 110* 108* 120*  BUN 26* 27* 32* 26*  CREATININE 1.35* 1.23 1.18 1.07  CALCIUM 9.4 8.5* 8.4* 8.4*   Liver Function Tests: Recent Labs  Lab 02/15/17 1144  AST 32  ALT 21  ALKPHOS 72  BILITOT 1.6*  PROT 6.9  ALBUMIN 4.0   No results for input(s): LIPASE, AMYLASE in the last 168 hours. No results for input(s): AMMONIA in the last 168 hours. CBC: Recent Labs  Lab 02/15/17 1144 02/16/17 0458 02/17/17 0431 02/18/17 0455  WBC 16.0* 14.0* 6.7 6.4  NEUTROABS 13.9*  --   --   --  HGB 14.0 12.4* 11.7* 12.3*  HCT 41.1 38.3* 35.3* 36.6*  MCV 92.4 93.9 93.1 91.5  PLT 111* 91* 87* 103*   Cardiac Enzymes: No results for input(s): CKTOTAL, CKMB, CKMBINDEX, TROPONINI in the last 168 hours. BNP: Invalid input(s): POCBNP CBG: No results for input(s): GLUCAP in the last 168 hours. D-Dimer No results for input(s): DDIMER in the last 72 hours. Hgb A1c No results for input(s): HGBA1C in the last 72 hours. Lipid Profile No results for input(s): CHOL, HDL, LDLCALC, TRIG, CHOLHDL, LDLDIRECT in the last 72 hours. Thyroid function studies No results for input(s): TSH, T4TOTAL, T3FREE, THYROIDAB in the last 72 hours.  Invalid input(s): FREET3 Anemia work up No results for input(s): VITAMINB12, FOLATE, FERRITIN, TIBC, IRON, RETICCTPCT in the last 72 hours. Urinalysis    Component Value Date/Time   COLORURINE YELLOW 02/15/2017 1210   APPEARANCEUR CLOUDY (A) 02/15/2017 1210   LABSPEC 1.015 02/15/2017 1210   PHURINE 7.0 02/15/2017 1210   GLUCOSEU 150 (A) 02/15/2017 1210   HGBUR MODERATE (A) 02/15/2017 1210   BILIRUBINUR NEGATIVE 02/15/2017 1210   BILIRUBINUR neg 06/30/2014 1530   KETONESUR NEGATIVE 02/15/2017 1210   PROTEINUR 30 (A) 02/15/2017 1210    UROBILINOGEN 0.2 12/04/2014 2344   NITRITE POSITIVE (A) 02/15/2017 1210   LEUKOCYTESUR LARGE (A) 02/15/2017 1210    Microbiology Recent Results (from the past 240 hour(s))  Blood Culture (routine x 2)     Status: None (Preliminary result)   Collection Time: 02/15/17  6:33 PM  Result Value Ref Range Status   Specimen Description BLOOD LEFT ANTECUBITAL  Final   Special Requests   Final    BOTTLES DRAWN AEROBIC ONLY Blood Culture adequate volume   Culture   Final    NO GROWTH 4 DAYS Performed at [facility], [address], [place], [place] [address]    Report Status PENDING  Incomplete  Blood Culture (routine x 2)     Status: None (Preliminary result)   Collection Time: 02/15/17  6:37 PM  Result Value Ref Range Status   Specimen Description BLOOD LEFT ARM  Final   Special Requests   Final    BOTTLES DRAWN AEROBIC ONLY Blood Culture adequate volume   Culture   Final    NO GROWTH 4 DAYS Performed at [facility], [place] [address], [place], [place] [address]    Report Status PENDING  Incomplete  Culture, Urine     Status: None   Collection Time: 02/16/17  9:59 AM  Result Value Ref Range Status   Specimen Description URINE, RANDOM  Final   Special Requests NONE  Final   Culture   Final    NO GROWTH Performed at [facility], [place] [address], [place], [place] [address]    Report Status 02/17/2017 FINAL  Final    Time coordinating discharge: Approximately 40 minutes  Vance Gather, MD  [facility] 02/18/2017, 3:46 PM Pager [phone]

## 2017-02-18 NOTE — Care Management Important Message (Signed)
Important Message  Patient Details  Name: Ronald [name] MRN: [id number] Date of Birth: 1928-03-04   Medicare Important Message Given:  Yes    Erenest Rasher, RN 02/18/2017, 1:58 PM

## 2017-02-18 NOTE — Care Management Note (Signed)
Case Management Note  Patient Details  Name: Ronald [name] MRN: [id number] Date of Birth: [birth year]  Subjective/Objective:    Sepsis d/t complicated UTI, HTN, prostate cancer, suprapubic catheter                Action/Plan: Discharge Planning:  NCM spoke to pt, wife and dtr at bedside. Offered choice for HH/list provided. Pt reports having [facility] in the past. Has RW at home. Contacted [facility] with new referral. Pharmacist provided wife with Eliquis 30 day free trial card. Contacted pharmacy and they do have in stock.   PCP Marletta Lor MD  Expected Discharge Date:  02/18/17               Expected Discharge Plan:  [place]  In-House Referral:  NA  Discharge planning Services  CM Consult  Post Acute Care Choice:  Home Health Choice offered to:  Patient, Spouse  DME Arranged:  N/A DME Agency:  NA  HH Arranged:  PT, OT HH Agency:  [facility]  Status of Service:  Completed, signed off  If discussed at [place] of Stay Meetings, dates discussed:    Additional Comments:  Erenest Rasher, RN 02/18/2017, 4:41 PM

## 2017-02-18 NOTE — Progress Notes (Signed)
Patient transitioned to a-fib rhythm. MD on call notified. No new orders given. Will continue to monitor.

## 2017-02-18 NOTE — Discharge Instructions (Signed)

## 2017-02-19 ENCOUNTER — Emergency Department (HOSPITAL_COMMUNITY): Payer: Medicare Other

## 2017-02-19 ENCOUNTER — Encounter (HOSPITAL_COMMUNITY): Payer: Self-pay | Admitting: Emergency Medicine

## 2017-02-19 ENCOUNTER — Emergency Department (HOSPITAL_COMMUNITY)
Admission: EM | Admit: 2017-02-19 | Discharge: 2017-02-19 | Disposition: A | Discharge status: Home / Self Care | Payer: Medicare Other | Attending: Emergency Medicine | Admitting: Emergency Medicine

## 2017-02-19 DIAGNOSIS — Z8546 Personal history of malignant neoplasm of prostate: Secondary | ICD-10-CM | POA: Insufficient documentation

## 2017-02-19 DIAGNOSIS — Z87891 Personal history of nicotine dependence: Secondary | ICD-10-CM | POA: Insufficient documentation

## 2017-02-19 DIAGNOSIS — R102 Pelvic and perineal pain: Secondary | ICD-10-CM

## 2017-02-19 DIAGNOSIS — F0390 Unspecified dementia without behavioral disturbance: Secondary | ICD-10-CM | POA: Insufficient documentation

## 2017-02-19 DIAGNOSIS — I2510 Atherosclerotic heart disease of native coronary artery without angina pectoris: Secondary | ICD-10-CM | POA: Insufficient documentation

## 2017-02-19 DIAGNOSIS — T83010A Breakdown (mechanical) of cystostomy catheter, initial encounter: Secondary | ICD-10-CM | POA: Diagnosis not present

## 2017-02-19 DIAGNOSIS — Z79899 Other long term (current) drug therapy: Secondary | ICD-10-CM | POA: Diagnosis not present

## 2017-02-19 DIAGNOSIS — K4090 Unilateral inguinal hernia, without obstruction or gangrene, not specified as recurrent: Secondary | ICD-10-CM | POA: Diagnosis not present

## 2017-02-19 DIAGNOSIS — I5022 Chronic systolic (congestive) heart failure: Secondary | ICD-10-CM | POA: Insufficient documentation

## 2017-02-19 DIAGNOSIS — T83198A Other mechanical complication of other urinary devices and implants, initial encounter: Secondary | ICD-10-CM | POA: Diagnosis not present

## 2017-02-19 DIAGNOSIS — Z951 Presence of aortocoronary bypass graft: Secondary | ICD-10-CM | POA: Diagnosis not present

## 2017-02-19 DIAGNOSIS — Y731 Therapeutic (nonsurgical) and rehabilitative gastroenterology and urology devices associated with adverse incidents: Secondary | ICD-10-CM | POA: Diagnosis not present

## 2017-02-19 DIAGNOSIS — K5792 Diverticulitis of intestine, part unspecified, without perforation or abscess without bleeding: Secondary | ICD-10-CM | POA: Insufficient documentation

## 2017-02-19 DIAGNOSIS — I130 Hypertensive heart and chronic kidney disease with heart failure and stage 1 through stage 4 chronic kidney disease, or unspecified chronic kidney disease: Secondary | ICD-10-CM | POA: Insufficient documentation

## 2017-02-19 DIAGNOSIS — Z7982 Long term (current) use of aspirin: Secondary | ICD-10-CM | POA: Insufficient documentation

## 2017-02-19 DIAGNOSIS — N182 Chronic kidney disease, stage 2 (mild): Secondary | ICD-10-CM | POA: Diagnosis not present

## 2017-02-19 DIAGNOSIS — R1030 Lower abdominal pain, unspecified: Secondary | ICD-10-CM | POA: Diagnosis not present

## 2017-02-19 DIAGNOSIS — K5732 Diverticulitis of large intestine without perforation or abscess without bleeding: Secondary | ICD-10-CM | POA: Diagnosis not present

## 2017-02-19 LAB — COMPREHENSIVE METABOLIC PANEL
ALT: 48 U/L (ref 17–63)
AST: 45 U/L — ABNORMAL HIGH (ref 15–41)
Albumin: 3.2 g/dL — ABNORMAL LOW (ref 3.5–5.0)
Alkaline Phosphatase: 77 U/L (ref 38–126)
Anion gap: 8 (ref 5–15)
BUN: 24 mg/dL — ABNORMAL HIGH (ref 6–20)
CO2: 24 mmol/L (ref 22–32)
Calcium: 8.7 mg/dL — ABNORMAL LOW (ref 8.9–10.3)
Chloride: 108 mmol/L (ref 101–111)
Creatinine, Ser: 0.98 mg/dL (ref 0.61–1.24)
GFR calc Af Amer: >60 mL/min (ref >60)
GFR calc non Af Amer: >60 mL/min (ref >60)
Glucose, Bld: 108 mg/dL — ABNORMAL HIGH (ref 65–99)
Potassium: 4.1 mmol/L (ref 3.5–5.1)
Sodium: 140 mmol/L (ref 135–145)
Total Bilirubin: 0.8 mg/dL (ref 0.3–1.2)
Total Protein: 6.3 g/dL — ABNORMAL LOW (ref 6.5–8.1)

## 2017-02-19 LAB — URINALYSIS, ROUTINE W REFLEX MICROSCOPIC
Bacteria, UA: NONE SEEN
Bilirubin Urine: NEGATIVE
Glucose, UA: NEGATIVE mg/dL
Ketones, ur: NEGATIVE mg/dL
Nitrite: NEGATIVE
Protein, ur: NEGATIVE mg/dL
Specific Gravity, Urine: 1.012 (ref 1.005–1.030)
Squamous Epithelial / LPF: NONE SEEN
pH: 6 (ref 5.0–8.0)

## 2017-02-19 LAB — CBC WITH DIFFERENTIAL/PLATELET
Basophils Absolute: 0 10*3/uL (ref 0.0–0.1)
Basophils Relative: 0 %
Eosinophils Absolute: 0.3 10*3/uL (ref 0.0–0.7)
Eosinophils Relative: 3 %
HCT: 38.7 % — ABNORMAL LOW (ref 39.0–52.0)
Hemoglobin: 13 g/dL (ref 13.0–17.0)
Lymphocytes Relative: 12 %
Lymphs Abs: 1 10*3/uL (ref 0.7–4.0)
MCH: 30.9 pg (ref 26.0–34.0)
MCHC: 33.6 g/dL (ref 30.0–36.0)
MCV: 91.9 fL (ref 78.0–100.0)
Monocytes Absolute: 1.2 10*3/uL — ABNORMAL HIGH (ref 0.1–1.0)
Monocytes Relative: 13 %
Neutro Abs: 6.4 10*3/uL (ref 1.7–7.7)
Neutrophils Relative %: 72 %
Platelets: 130 10*3/uL — ABNORMAL LOW (ref 150–400)
RBC: 4.21 MIL/uL — ABNORMAL LOW (ref 4.22–5.81)
RDW: 13.5 % (ref 11.5–15.5)
WBC: 8.9 10*3/uL (ref 4.0–10.5)

## 2017-02-19 LAB — LIPASE, BLOOD: Lipase: 29 U/L (ref 11–51)

## 2017-02-19 MED ORDER — IOPAMIDOL (ISOVUE-300) INJECTION 61%
INTRAVENOUS | Status: AC
  Filled 2017-02-19: qty 100

## 2017-02-19 MED ORDER — ACETAMINOPHEN 325 MG PO TABS
650.0000 mg | ORAL_TABLET | Freq: Once | ORAL | Status: AC
  Administered 2017-02-19: 650 mg via ORAL
  Filled 2017-02-19: qty 2

## 2017-02-19 MED ORDER — METRONIDAZOLE 500 MG PO TABS: 500.0000 mg | ORAL_TABLET | Freq: Three times a day (TID) | ORAL | 0 refills | Status: DC

## 2017-02-19 MED ORDER — METRONIDAZOLE 500 MG PO TABS
500.0000 mg | ORAL_TABLET | Freq: Once | ORAL | Status: AC
  Administered 2017-02-19: 500 mg via ORAL
  Filled 2017-02-19: qty 1

## 2017-02-19 NOTE — ED Provider Notes (Signed)
Care assumed from previous provider PA [facility]. Please see their note for further details to include full history and physical. To summarize in short pt is a 83 year old gentleman who presents to the ED with concern for leaking of his suprapubic catheter with associated abdominal pain.  Patient recently diagnosed and discharged from the hospital yesterday for urosepsis with no complaints.  Patient's catheter was flushed in the ED by ED attending.  Patient is catheter is continue to drain.  Prior provider order CT scan to rule any intra-abdominal pathology given new abdominal pain.  Lab work was reassuring.  No leukocytosis.  UA appears without any signs of infection.. Case discussed, plan agreed upon.   CT scan returned that shows acute diverticulitis.  Also notes that the suprapubic catheter is in the bladder.  No other acute findings noted.  Repeat abdominal exam is benign.  Patient tolerating p.o. fluids any difficulties.  Pain improved.  Will add on Flagyl to his Cipro that he is taking for UTI.  Encouraged follow-up with PCP.   I also discussed CT findings with patient concerning the pancreatic lesion noted.  Pt is hemodynamically stable, in NAD, & able to ambulate in the ED. Evaluation does not show pathology that would require ongoing emergent intervention or inpatient treatment. I explained the diagnosis to the patient. Pain has been managed & has no complaints prior to dc. Pt is comfortable with above plan and is stable for discharge at this time. All questions were answered prior to disposition. Strict return precautions for f/u to the ED were discussed. Encouraged follow up with PCP.  Dicussed findings with attending Dr. Gilford Raid who is agreeable with the above plan.        Doristine Devoid, PA-C 02/19/17 2059    Doristine Devoid, PA-C 02/19/17 2059    Isla Pence, MD 02/19/17 2127

## 2017-02-19 NOTE — Discharge Instructions (Signed)
You have been diagnosed with diverticulitis.  You are already taking Cipro which is antibiotic to treat diverticulitis and your UTI.  I am going to add on Flagyl which is an additional antibiotic.  Please take as prescribed and complete the entire course of the Cipro and Flagyl.  Your catheter is in place on the CT scan.  It is draining in the ED.  Continue follow-up with your urologist as needed. Tylenol for pain.  If you develop any fevers, worsening pain, vomiting return to the ED for evaluation.

## 2017-02-19 NOTE — ED Provider Notes (Signed)
[facility] DEPT Provider Note   CSN: [id number] Arrival date & time: 02/19/17  1303     History   Chief Complaint Chief Complaint  Patient presents with  . Catheter Issues    HPI Ronald [name] is a 83 y.o. male with history of CHF, diverticulosis, GERD, HLD, HTN, ischemic cardiomyopathy, urethral stricture with suprapubic catheter presents today with chief complaint acute onset of suprapubic catheter problem.  Patient was discharged from the hospital yesterday after management of urosepsis with no complaints.  He states that he awoke this morning and drink 2 cups of water and 1 cup of coffee and noted no drainage from his suprapubic catheter.  Shortly thereafter, he noted that urine had soaked through his shirt, underwear, and pants.  It appeared to be coming from the site of his catheter.  He endorses moderate suprapubic pressure which does not radiate. States he feels more bloated than usual. No aggravating or alleviating factors noted.  He notes moderate amount of urine output in his bag today.  He denies fevers, chills, chest pain, or shortness of breath.  Denies hematuria.  States he had a very large somewhat loose bowel movement earlier today, denies melena or hematochezia.  Has not tried anything for his symptoms today.  States he has been taking his Eliquis and Cipro as prescribed.  The history is provided by the patient.    Past Medical History:  Diagnosis Date  . Arthritis    "joints ache" (01/02/2014)  . At risk for sleep apnea    STOP-BANG= 4    SENT TO PCP 12-23-2013  . Bladder calculi   . CHF (congestive heart failure) ([place])   . Chronic cystitis   . Coronary artery disease CARDIOLOGIST-  DR Grace Bushy  MI -- S/P  11/89 CABG x6 (70% circ; 90% PD; 60-70% distal left main; 30% left circ; 90% 1st diag;, 2nd diag and 3rd diag. w/90%,  mild stenosis LAD and 60-70% pLAD)  Re-do CABG x5 in 1997  . Diverticulosis   . GERD (gastroesophageal  reflux disease)   . History of Bell's palsy    RIGHT SIDE-- NO RESIDUAL  . Hx of dizziness   . Hyperlipidemia   . Hypertension    "not anymore" (01/02/2014)  . Ischemic cardiomyopathy    ef 35-40% per cath 08-28-2013  . Kidney stones "years ago"   "passed them"  . Melanoma of ear ([place])    "right"  . Mild dementia   . Myocardial infarction ([place]) 1986; 1997  . Nocturia   . OAB (overactive bladder)   . Prostate cancer ([place]) 1998   S/P  [place]; [place]  . S/P CABG (coronary artery bypass graft)    [place]  . Skipped heart beats    occasional  . Urethral stricture   . UTI (urinary tract infection) 09/2015  . Wears hearing aid    bilateral  . Wears partial dentures     Patient Active Problem List   Diagnosis Date Noted  . AKI (acute kidney injury) ([place])   . Stage 2 chronic kidney disease   . Bacteremia due to Pseudomonas 01/21/2016  . Debility 01/21/2016  . History of Bell's palsy   . Gastroesophageal reflux disease without esophagitis   . Coronary artery disease involving coronary bypass graft of native heart without angina pectoris   . History of prostate cancer   . Suprapubic catheter ([place])   .  Incontinence of feces   . Renal failure syndrome   . Bacteremia   . NSTEMI (non-ST elevated myocardial infarction) ([place]) 01/16/2016  . Septic shock ([place])   . ARF (acute renal failure) ([place]) 10/13/2015  . Sepsis ([place]) 12/05/2014  . Urinary tract infection associated with cystostomy catheter ([place]) 10/03/2014  . Acute lower UTI 09/10/2014  . Bacteremia due to Enterococcus 01/22/2014  . Chronic systolic CHF (congestive heart failure) ([place]) 08/04/2013  . Urethral stricture 12/25/2013  . Bladder calculus 12/25/2013  . LV dysfunction 09/23/2013  . Unstable angina ([place]) 08/27/2013  . Bacteremia due to Escherichia coli 05/20/2012  . Urinary tract infection 09/15/2011  . Calculus of kidney 09/12/2011  . HYPERCHOLESTEROLEMIA 10/18/2007  .  HTN (hypertension) 10/18/2007  . OSTEOARTHRITIS 02/07/2007  . BELL'S PALSY, RIGHT 10/26/2006  . Coronary atherosclerosis 10/26/2006  . PROSTATE CANCER, HX OF 10/26/2006  . NEPHROLITHIASIS, HX OF 10/26/2006    Past Surgical History:  Procedure Laterality Date  . CARDIAC CATHETERIZATION  02-22-2005  dr Vidal Schwalbe   mild to moderate lv dysfunction with inferobasilar akinesis/  totally occluded SVG to Intermediate Diagonal and SVG to PDA and RCA branches, totally occluded native coronary circulation with diffuse disease pLAD diagonal system with potentially could be ischemic/  patent SVG to OM with collaterals to dRCA and patent LIMA to LAD and diagonal systemss  . CARDIAC CATHETERIZATION  08-28-2013  DR Daneen Schick   widely patent sequential left internal graft to the diagonal/LAD, widely patent SVG to OM with proximal 50% narrowing noted in the graft, total occlusion SVG's to RCA, RI, and the Diagonal/ LV dysfunction with inferobasal aneurysm and mid anterior wall region of akinesis/  overall EF 35-40%/  Total occlusion of the navtive circulation/  no significant change compared to 2006 cath  . CARDIOVASCULAR STRESS TEST  08-01-2011  dr Angelena Form   inferior scar and possible soft tissue attenuation with minimal peri-infarct ischemia, small region of anterior ischemia and scar/  LVEF 53% LV wall motion with inferior hypokinesis/ no significant change from scan july 2011  . CATARACT EXTRACTION W/ INTRAOCULAR LENS  IMPLANT, BILATERAL Bilateral 2007  . CORONARY ARTERY BYPASS GRAFT  11/ [place]-- 6 vessel/  1997 Re-do 5 vessel  . CYSTOSCOPY WITH URETHRAL DILATATION N/A 12/25/2013   Procedure: CYSTOSCOPY WITH URETHRAL DILATATION, WITH BIOPSY;  Surgeon: Bernestine Amass, MD;  Location: [facility];  Service: Urology;  Laterality: N/A;  . CYSTOSCOPY WITH URETHRAL DILATATION N/A 12/22/2014   Procedure: CYSTOSCOPY WITH URETHRAL DILATATION;  Surgeon: Rana Snare, MD;  Location: [facility]  ORS;  Service: Urology;  Laterality: N/A;  BALLOON DILATION CATHETER    . EUS  10/05/2011   Procedure: ESOPHAGEAL ENDOSCOPIC ULTRASOUND (EUS) RADIAL;  Surgeon: Arta Silence, MD;  Location: [facility] ENDOSCOPY;  Service: Endoscopy;  Laterality: N/A;  . INSERTION OF SUPRAPUBIC CATHETER N/A 12/22/2014   Procedure: INSERTION OF SUPRAPUBIC CATHETER ;  Surgeon: Rana Snare, MD;  Location: [facility] ORS;  Service: Urology;  Laterality: N/A;  . LAPAROSCOPIC CHOLECYSTECTOMY  12-23-2005  . LEFT HEART CATHETERIZATION WITH CORONARY/GRAFT ANGIOGRAM N/A 08/28/2013   Procedure: LEFT HEART CATHETERIZATION WITH Beatrix Fetters;  Surgeon: Sinclair Grooms, MD;  Location: [facility] CATH LAB;  Service: Cardiovascular;  Laterality: N/A;  . MELANOMA EXCISION  X 1   "ear"  . RADIOACTIVE PROSTATE SEED IMPLANTS  1998  . SKIN CANCER EXCISION  X 2   "top of head"  . [place]  Home Medications    Prior to Admission medications   Medication Sig Start Date End Date Taking? Authorizing Provider  acetaminophen (TYLENOL) 500 MG tablet Take 1,000 mg by mouth every 6 (six) hours as needed for pain or fever.     [provider]  apixaban (ELIQUIS) 5 MG TABS tablet Take 1 tablet (5 mg total) by mouth 2 (two) times daily. 02/18/17   Patrecia Pour, MD  aspirin EC 81 MG EC tablet Take 1 tablet (81 mg total) by mouth daily. 08/29/13   Lyda Jester M, PA-C  atorvastatin (LIPITOR) 80 MG tablet Take 1 tablet (80 mg total) by mouth every evening. Patient taking differently: Take 40 mg by mouth every evening.  01/21/16   Orson Eva, MD  Calcium Carbonate Antacid (TUMS PO) Take 3 tablets by mouth daily as needed (gas).    [provider]  Cholecalciferol (VITAMIN D) 2000 UNITS tablet Take 2,000 Units by mouth daily.    [provider]  ciprofloxacin (CIPRO) 500 MG tablet Take 1 tablet (500 mg total) by mouth 2 (two) times daily. 02/18/17   Patrecia Pour, MD  flavoxATE (URISPAS)  100 MG tablet Take 100 mg by mouth 3 (three) times daily as needed for bladder spasms.    [provider]  furosemide (LASIX) 20 MG tablet Take 1 tablet (20 mg total) by mouth daily as needed. For swelling 05/18/16 02/15/17  [place] Blanks, MD  galantamine (RAZADYNE ER) 8 MG 24 hr capsule Take 8 mg by mouth daily with breakfast.    [provider]  hydroxypropyl methylcellulose (ISOPTO TEARS) 2.5 % ophthalmic solution Place 1 drop into both eyes 2 (two) times daily.     [provider]  Multiple Vitamin (MULTIVITAMIN WITH MINERALS) TABS Take 1 tablet by mouth daily.    [provider]  nitroGLYCERIN (NITROSTAT) 0.4 MG SL tablet Place 0.4 mg under the tongue every 5 (five) minutes as needed for chest pain.     [provider]  ranitidine (ZANTAC) 150 MG tablet Take 1 tablet (150 mg total) by mouth 2 (two) times daily. 05/18/16   [place] Blanks, MD    Family History Family History  Problem Relation Age of Onset  . Heart disease Mother   . Diabetes Father   . Heart disease Brother     Social History Social History   Tobacco Use  . Smoking status: Former Smoker    Years: 40.00    Types: Cigars    Last attempt to quit: 12/24/1983    Years since quitting: 33.1  . Smokeless tobacco: Former Systems developer    Types: Chew    Quit date: 08/28/2013  . Tobacco comment: smoked a pipe  Substance Use Topics  . Alcohol use: No  . Drug use: No     Allergies   Pantoprazole; Nitrofurantoin; Sulfa antibiotics; and Lidocaine   Review of Systems Review of Systems  Constitutional: Negative for chills and fever.  Respiratory: Negative for shortness of breath.   Cardiovascular: Negative for chest pain.  Gastrointestinal: Positive for abdominal pain. Negative for constipation, diarrhea, nausea and vomiting.  Genitourinary: Positive for decreased urine volume.  All other systems reviewed and are negative.    Physical Exam Updated Vital Signs BP  (!) 166/69 (BP Location: Right Arm)   Pulse (!) 102   Resp 17   SpO2 97%   Physical Exam  Constitutional: He appears well-developed and well-nourished. No distress.  Resting comfortably in bed  HENT:  Head: Normocephalic and atraumatic.  Eyes: Conjunctivae are normal. Right eye exhibits no discharge. Left eye exhibits no discharge.  Neck: Normal range of motion. Neck supple. No JVD present. No tracheal deviation present.  Cardiovascular: Normal rate, regular rhythm and normal heart sounds.  Pulmonary/Chest: Effort normal and breath sounds normal. No respiratory distress.  Abdominal: Soft. Bowel sounds are normal. He exhibits distension. There is tenderness. There is no guarding.  Suprapubic catheter in place, appears to be draining urine from the site with no surrounding induration or erythema.  Tender to palpation in the suprapubic region mildly.  Mild left lower quadrant tenderness to palpation.  No CVA tenderness, Murphy sign absent, Rovsing's absent, no TTP at McBurney's point, 92cc on bladder scan  Musculoskeletal: He exhibits no edema.  Moves extremities spontaneously  Neurological: He is alert.  Fluent speech, no facial droop  Skin: Skin is warm and dry. No erythema.  Psychiatric: He has a normal mood and affect. His behavior is normal.  Nursing note and vitals reviewed.    ED Treatments / Results  Labs (all labs ordered are listed, but only abnormal results are displayed) Labs Reviewed  CBC WITH DIFFERENTIAL/PLATELET - Abnormal; Notable for the following components:      Result Value   RBC 4.21 (*)    HCT 38.7 (*)    Platelets 130 (*)    Monocytes Absolute 1.2 (*)    All other components within normal limits  LIPASE, BLOOD  COMPREHENSIVE METABOLIC PANEL  URINALYSIS, ROUTINE W REFLEX MICROSCOPIC    EKG  EKG Interpretation None       Radiology No results found.  Procedures Procedures (including critical care time)  Medications Ordered in ED Medications    acetaminophen (TYLENOL) tablet 650 mg (not administered)     Initial Impression / Assessment and Plan / ED Course  I have reviewed the triage vital signs and the nursing notes.  Pertinent labs & imaging results that were available during my care of the patient were reviewed by me and considered in my medical decision making (see chart for details).     Patient with abdominal pain and distention and decreased urine output from suprapubic catheter beginning earlier today.  Afebrile, vital signs are stable. Tender in the suprapubic and LLQ regions. Dr. Dayna Barker saw and evaluated patient, flushed catheter successfully. On re-evaluation, no resolution in patient's pain. Will obtain labs and CT scan for further evaluation.   4:37 PM  CBC shows no leukocytosis. Awaiting remainder of lab results and scan. Signed out to oncoming provider PA [place]. If labwork reassuring, he is stable for discharge home with close urology follow up. If acute worsening noted on labwork/imaging, may require re-admission for further evaluation.   Final Clinical Impressions(s) / ED Diagnoses   Final diagnoses:  Suprapubic pain, acute  Suprapubic catheter dysfunction, initial encounter [facility])    ED Discharge Orders    None       Debroah Baller 02/19/17 1637    Mesner, Corene Cornea, MD 02/25/17 1333

## 2017-02-19 NOTE — ED Triage Notes (Signed)
Patient presents with wife reports he was discharged from hospital yesterday. He noticed earlier today his suprapubic catheter was not draining into his leg bag and is having drainage around the site. C/o lower abdominal pressure.

## 2017-02-20 ENCOUNTER — Telehealth: Payer: Self-pay | Admitting: *Deleted

## 2017-02-20 DIAGNOSIS — N3281 Overactive bladder: Secondary | ICD-10-CM | POA: Diagnosis not present

## 2017-02-20 DIAGNOSIS — N3000 Acute cystitis without hematuria: Secondary | ICD-10-CM | POA: Diagnosis not present

## 2017-02-20 LAB — CULTURE, BLOOD (ROUTINE X 2)
Culture: NO GROWTH
Culture: NO GROWTH
Special Requests: ADEQUATE
Special Requests: ADEQUATE

## 2017-02-20 NOTE — Telephone Encounter (Signed)
Unable to reach patient at time of TCM Call.  Phone rang on end with no answer. Will attempt call back tomorrow.

## 2017-02-21 NOTE — Telephone Encounter (Signed)
Transition Care Management Follow-up Telephone Call  Per Discharge Summary:  Admit date: 02/15/2017 Discharge date: 02/18/2017  Admitted From: Home Disposition: Home   Recommendations for Outpatient Follow-up:  1. Follow up with PCP in 1-2 weeks 2. Please obtain BMP/CBC in one week.  3. Needs to follow up with cardiology. Started eliquis for new Dx AFib with CVR.  4. Follow up with urology 5. Follow up blood cultures from 11/21 (NGTD at discharge on 11/24).  Home Health: PT, OT Equipment/Devices: None new Discharge Condition: Stable CODE STATUS: Full Diet recommendation: Heart healthy  --   How have you been since you were released from the hospital? "I've taken all this medicine. I've been feeling weak. I have a hard time doing my exercises." Patient reports he is recovering from hospitalization and trying to get his strength back. He is walking as able and trying to increase activity as tolerated.   Do you understand why you were in the hospital? yes   Do you understand the discharge instructions? yes   Where were you discharged to? Home   Items Reviewed:  Medications reviewed: yes  Allergies reviewed: yes  Dietary changes reviewed: yes, heart healthy, low sodium  Referrals reviewed: yes   Functional Questionnaire:   Activities of Daily Living (ADLs):   He states they are independent in the following: ambulation, bathing and hygiene, feeding, continence, grooming, toileting and dressing States they require assistance with the following: none   Any transportation issues/concerns?: no   Any patient concerns? no   Confirmed importance and date/time of follow-up visits scheduled yes  Provider Appointment booked with Dr. Bluford Kaufmann 03/01/17 @ 11:15am  Confirmed with patient if condition begins to worsen call PCP or go to the ER.  Patient was given the office number and encouraged to call back with question or concerns.  : yes

## 2017-02-21 NOTE — ED Provider Notes (Signed)
[facility] Provider Note   CSN: [id number] Arrival date & time: 02/15/17  1119     History   Chief Complaint Chief Complaint  Patient presents with  . Fever    HPI Ronald [name] is a 83 y.o. male.  HPI Seen yesterday for replacement of suprapubic catheter.  Patient has developed fever and chills.  Patient had Tylenol prior to arrival.  Patient does not have significant pain.  He endorses general fatigue.  He does have prior history of similar symptoms and developed sepsis. Past Medical History:  Diagnosis Date  . Arthritis    "joints ache" (01/02/2014)  . At risk for sleep apnea    STOP-BANG= 4    SENT TO PCP 12-23-2013  . Bladder calculi   . CHF (congestive heart failure) ([place])   . Chronic cystitis   . Coronary artery disease CARDIOLOGIST-  DR Grace Bushy  MI -- S/P  11/89 CABG x6 (70% circ; 90% PD; 60-70% distal left main; 30% left circ; 90% 1st diag;, 2nd diag and 3rd diag. w/90%,  mild stenosis LAD and 60-70% pLAD)  Re-do CABG x5 in 1997  . Diverticulosis   . GERD (gastroesophageal reflux disease)   . History of Bell's palsy    RIGHT SIDE-- NO RESIDUAL  . Hx of dizziness   . Hyperlipidemia   . Hypertension    "not anymore" (01/02/2014)  . Ischemic cardiomyopathy    ef 35-40% per cath 08-28-2013  . Kidney stones "years ago"   "passed them"  . Melanoma of ear ([place])    "right"  . Mild dementia   . Myocardial infarction ([place]) 1986; 1997  . Nocturia   . OAB (overactive bladder)   . Prostate cancer ([place]) 1998   S/P  [place]; [place]  . S/P CABG (coronary artery bypass graft)    [place]  . Skipped heart beats    occasional  . Urethral stricture   . UTI (urinary tract infection) 09/2015  . Wears hearing aid    bilateral  . Wears partial dentures     Patient Active Problem List   Diagnosis Date Noted  . AKI (acute kidney injury) ([place])   . Stage 2 chronic kidney disease    . Bacteremia due to Pseudomonas 01/21/2016  . Debility 01/21/2016  . History of Bell's palsy   . Gastroesophageal reflux disease without esophagitis   . Coronary artery disease involving coronary bypass graft of native heart without angina pectoris   . History of prostate cancer   . Suprapubic catheter ([place])   . Incontinence of feces   . Renal failure syndrome   . Bacteremia   . NSTEMI (non-ST elevated myocardial infarction) ([place]) 01/16/2016  . Septic shock ([place])   . ARF (acute renal failure) ([place]) 10/13/2015  . Sepsis ([place]) 12/05/2014  . Urinary tract infection associated with cystostomy catheter ([place]) 10/03/2014  . Acute lower UTI 09/10/2014  . Bacteremia due to Enterococcus 01/22/2014  . Chronic systolic CHF (congestive heart failure) ([place]) 08/07/202015  . Urethral stricture 12/25/2013  . Bladder calculus 12/25/2013  . LV dysfunction 09/23/2013  . Unstable angina ([place]) 08/27/2013  . Bacteremia due to Escherichia coli 05/20/2012  . Urinary tract infection 09/15/2011  . Calculus of kidney 09/12/2011  . HYPERCHOLESTEROLEMIA 10/18/2007  . HTN (hypertension) 10/18/2007  . OSTEOARTHRITIS 02/07/2007  . BELL'S PALSY, RIGHT 10/26/2006  . Coronary atherosclerosis 10/26/2006  . PROSTATE  CANCER, HX OF 10/26/2006  . NEPHROLITHIASIS, HX OF 10/26/2006    Past Surgical History:  Procedure Laterality Date  . CARDIAC CATHETERIZATION  02-22-2005  dr Vidal Schwalbe   mild to moderate lv dysfunction with inferobasilar akinesis/  totally occluded SVG to Intermediate Diagonal and SVG to PDA and RCA branches, totally occluded native coronary circulation with diffuse disease pLAD diagonal system with potentially could be ischemic/  patent SVG to OM with collaterals to dRCA and patent LIMA to LAD and diagonal systemss  . CARDIAC CATHETERIZATION  08-28-2013  DR Daneen Schick   widely patent sequential left internal graft to the diagonal/LAD, widely patent SVG to OM with proximal 50% narrowing noted in the  graft, total occlusion SVG's to RCA, RI, and the Diagonal/ LV dysfunction with inferobasal aneurysm and mid anterior wall region of akinesis/  overall EF 35-40%/  Total occlusion of the navtive circulation/  no significant change compared to 2006 cath  . CARDIOVASCULAR STRESS TEST  08-01-2011  dr Angelena Form   inferior scar and possible soft tissue attenuation with minimal peri-infarct ischemia, small region of anterior ischemia and scar/  LVEF 53% LV wall motion with inferior hypokinesis/ no significant change from scan july 2011  . CATARACT EXTRACTION W/ INTRAOCULAR LENS  IMPLANT, BILATERAL Bilateral 2007  . CORONARY ARTERY BYPASS GRAFT  11/ [place]-- 6 vessel/  1997 Re-do 5 vessel  . CYSTOSCOPY WITH URETHRAL DILATATION N/A 12/25/2013   Procedure: CYSTOSCOPY WITH URETHRAL DILATATION, WITH BIOPSY;  Surgeon: Bernestine Amass, MD;  Location: [facility];  Service: Urology;  Laterality: N/A;  . CYSTOSCOPY WITH URETHRAL DILATATION N/A 12/22/2014   Procedure: CYSTOSCOPY WITH URETHRAL DILATATION;  Surgeon: Rana Snare, MD;  Location: [facility];  Service: Urology;  Laterality: N/A;  BALLOON DILATION CATHETER    . EUS  10/05/2011   Procedure: ESOPHAGEAL ENDOSCOPIC ULTRASOUND (EUS) RADIAL;  Surgeon: Arta Silence, MD;  Location: [facility];  Service: Endoscopy;  Laterality: N/A;  . INSERTION OF SUPRAPUBIC CATHETER N/A 12/22/2014   Procedure: INSERTION OF SUPRAPUBIC CATHETER ;  Surgeon: Rana Snare, MD;  Location: [facility];  Service: Urology;  Laterality: N/A;  . LAPAROSCOPIC CHOLECYSTECTOMY  12-23-2005  . LEFT HEART CATHETERIZATION WITH CORONARY/GRAFT ANGIOGRAM N/A 08/28/2013   Procedure: LEFT HEART CATHETERIZATION WITH Beatrix Fetters;  Surgeon: Sinclair Grooms, MD;  Location: [facility] CATH LAB;  Service: Cardiovascular;  Laterality: N/A;  . MELANOMA EXCISION  X 1   "ear"  . RADIOACTIVE PROSTATE SEED IMPLANTS  1998  . SKIN CANCER EXCISION  X 2   "top of head"  .  TONSILLECTOMY AND ADENOIDECTOMY  1954       Home Medications    Prior to Admission medications   Medication Sig Start Date End Date Taking? Authorizing Provider  acetaminophen (TYLENOL) 500 MG tablet Take 1,000 mg by mouth every 6 (six) hours as needed for pain or fever.    Yes [provider]  aspirin EC 81 MG EC tablet Take 1 tablet (81 mg total) by mouth daily. 08/29/13  Yes Rosita Fire, Brittainy M, PA-C  atorvastatin (LIPITOR) 80 MG tablet Take 1 tablet (80 mg total) by mouth every evening. 01/21/16  Yes Tat, Shanon Brow, MD  Calcium Carbonate Antacid (TUMS PO) Take 3 tablets by mouth daily as needed (gas).   Yes [provider]  Cholecalciferol (VITAMIN D) 2000 UNITS tablet Take 2,000 Units by mouth daily.   Yes [provider]  flavoxATE (URISPAS) 100 MG tablet  Take 100 mg by mouth 3 (three) times daily as needed for bladder spasms.   Yes [provider]  furosemide (LASIX) 20 MG tablet Take 1 tablet (20 mg total) by mouth daily as needed. For swelling 05/18/16 02/15/17 Yes Burnell Blanks, MD  galantamine (RAZADYNE ER) 8 MG 24 hr capsule Take 8 mg by mouth daily with breakfast.   Yes [provider]  hydroxypropyl methylcellulose (ISOPTO TEARS) 2.5 % ophthalmic solution Place 1 drop into both eyes 2 (two) times daily.    Yes [provider]  Multiple Vitamin (MULTIVITAMIN WITH MINERALS) TABS Take 1 tablet by mouth daily.   Yes [provider]  ranitidine (ZANTAC) 150 MG tablet Take 1 tablet (150 mg total) by mouth 2 (two) times daily. 05/18/16  Yes Burnell Blanks, MD  apixaban (ELIQUIS) 5 MG TABS tablet Take 1 tablet (5 mg total) by mouth 2 (two) times daily. 02/18/17   Patrecia Pour, MD  ciprofloxacin (CIPRO) 500 MG tablet Take 1 tablet (500 mg total) by mouth 2 (two) times daily. 02/18/17   Patrecia Pour, MD  metroNIDAZOLE (FLAGYL) 500 MG tablet Take 1 tablet (500 mg total) by mouth 3 (three) times daily. 02/19/17    Doristine Devoid, PA-C  nitroGLYCERIN (NITROSTAT) 0.4 MG SL tablet Place 0.4 mg under the tongue every 5 (five) minutes as needed for chest pain.     [provider]    Family History Family History  Problem Relation Age of Onset  . Heart disease Mother   . Diabetes Father   . Heart disease Brother     Social History Social History   Tobacco Use  . Smoking status: Former Smoker    Years: 40.00    Types: Cigars    Last attempt to quit: 12/24/1983    Years since quitting: 33.1  . Smokeless tobacco: Former Systems developer    Types: Chew    Quit date: 08/28/2013  . Tobacco comment: smoked a pipe  Substance Use Topics  . Alcohol use: No  . Drug use: No     Allergies   Pantoprazole; Nitrofurantoin; Sulfa antibiotics; and Lidocaine   Review of Systems Review of Systems 10 Systems reviewed and are negative for acute change except as noted in the HPI.   Physical Exam Updated Vital Signs BP 134/66 (BP Location: Right Arm)   Pulse 79   Temp 98.4 F (36.9 C) (Oral)   Resp 18   Ht 5\' 5"  (1.651 m)   Wt 77.5 kg (170 lb 13.7 oz)   SpO2 95%   BMI 28.43 kg/m   Physical Exam  Constitutional: He appears well-developed and well-nourished.  Alert.  No respiratory distress.  Slightly fatigued in appearance.  HENT:  Head: Normocephalic and atraumatic.  Mouth/Throat: Oropharynx is clear and moist.  Eyes: Conjunctivae and EOM are normal.  Neck: Neck supple.  Cardiovascular: Normal rate and regular rhythm.  No murmur heard. Pulmonary/Chest: Effort normal and breath sounds normal. No respiratory distress.  Abdominal: Soft. There is no tenderness.  Musculoskeletal: He exhibits no edema.  Neurological: He is alert. He exhibits normal muscle tone. Coordination normal.  Skin: Skin is warm and dry.  Psychiatric: He has a normal mood and affect.  Nursing note and vitals reviewed.    ED Treatments / Results  Labs (all labs ordered are listed, but only abnormal results are  displayed) Labs Reviewed  COMPREHENSIVE METABOLIC PANEL - Abnormal; Notable for the following components:      Result  Value   Glucose, Bld 164 (*)    BUN 26 (*)    Creatinine, Ser 1.35 (*)    Total Bilirubin 1.6 (*)    GFR calc non Af Amer 46 (*)    GFR calc Af Amer 53 (*)    All other components within normal limits  CBC WITH DIFFERENTIAL/PLATELET - Abnormal; Notable for the following components:   WBC 16.0 (*)    Platelets 111 (*)    Neutro Abs 13.9 (*)    All other components within normal limits  URINALYSIS, ROUTINE W REFLEX MICROSCOPIC - Abnormal; Notable for the following components:   APPearance CLOUDY (*)    Glucose, UA 150 (*)    Hgb urine dipstick MODERATE (*)    Protein, ur 30 (*)    Nitrite POSITIVE (*)    Leukocytes, UA LARGE (*)    Bacteria, UA MANY (*)    Squamous Epithelial / LPF 0-5 (*)    All other components within normal limits  BASIC METABOLIC PANEL - Abnormal; Notable for the following components:   Chloride 112 (*)    Glucose, Bld 110 (*)    BUN 27 (*)    Calcium 8.5 (*)    GFR calc non Af Amer 51 (*)    GFR calc Af Amer 59 (*)    All other components within normal limits  CBC - Abnormal; Notable for the following components:   WBC 14.0 (*)    RBC 4.08 (*)    Hemoglobin 12.4 (*)    HCT 38.3 (*)    Platelets 91 (*)    All other components within normal limits  BRAIN NATRIURETIC PEPTIDE - Abnormal; Notable for the following components:   B Natriuretic Peptide 351.6 (*)    All other components within normal limits  BASIC METABOLIC PANEL - Abnormal; Notable for the following components:   Chloride 112 (*)    Glucose, Bld 108 (*)    BUN 32 (*)    Calcium 8.4 (*)    GFR calc non Af Amer 54 (*)    Anion gap 4 (*)    All other components within normal limits  CBC - Abnormal; Notable for the following components:   RBC 3.79 (*)    Hemoglobin 11.7 (*)    HCT 35.3 (*)    Platelets 87 (*)    All other components within normal limits  BASIC  METABOLIC PANEL - Abnormal; Notable for the following components:   Potassium 3.3 (*)    Glucose, Bld 120 (*)    BUN 26 (*)    Calcium 8.4 (*)    All other components within normal limits  CBC - Abnormal; Notable for the following components:   RBC 4.00 (*)    Hemoglobin 12.3 (*)    HCT 36.6 (*)    Platelets 103 (*)    All other components within normal limits  I-STAT CG4 LACTIC ACID, ED - Abnormal; Notable for the following components:   Lactic Acid, Venous 2.67 (*)    All other components within normal limits  CULTURE, BLOOD (ROUTINE X 2)  CULTURE, BLOOD (ROUTINE X 2)  URINE CULTURE  PROTIME-INR  APTT  LACTIC ACID, PLASMA  I-STAT CG4 LACTIC ACID, ED  I-STAT CG4 LACTIC ACID, ED    EKG  EKG Interpretation  Date/Time:  Wednesday February 15 2017 13:11:57 EST Ventricular Rate:  55 PR Interval:    QRS Duration: 135 QT Interval:  441 QTC Calculation: 422 R Axis:   -20  Text Interpretation:  Sinus rhythm Atrial premature complexes Prolonged PR interval Left ventricular hypertrophy Confirmed by Randal Buba, April ([id number]) on 02/16/2017 9:23:14 AM       Radiology No results found.  Procedures Procedures (including critical care time)  Medications Ordered in ED Medications  sodium chloride 0.9 % bolus 1,000 mL (0 mLs Intravenous Stopped 02/15/17 1348)    And  sodium chloride 0.9 % bolus 1,000 mL (0 mLs Intravenous Stopped 02/15/17 1507)    And  sodium chloride 0.9 % bolus 250 mL (0 mLs Intravenous Stopped 02/15/17 1428)  piperacillin-tazobactam (ZOSYN) IVPB 3.375 g (0 g Intravenous Stopped 02/15/17 1336)  vancomycin (VANCOCIN) IVPB 1000 mg/200 mL premix (0 mg Intravenous Stopped 02/15/17 1428)  sodium chloride 0.9 % bolus 250 mL (0 mLs Intravenous Stopped 02/16/17 2017)  vitamin A & D ointment (5 application  Given 40/76/80 1016)  potassium chloride SA (K-DUR,KLOR-CON) CR tablet 40 mEq (40 mEq Oral Given 02/18/17 1328)     Initial Impression / Assessment and Plan / ED  Course  I have reviewed the triage vital signs and the nursing notes.  Pertinent labs & imaging results that were available during my care of the patient were reviewed by me and considered in my medical decision making (see chart for details).     Consult: Hospitalist for admission.  Final Clinical Impressions(s) / ED Diagnoses   Final diagnoses:  UTI SEPSIS  Sepsis protocol initiated.  Patient is treated for UTI.  Plan for admission.     Charlesetta Shanks, MD 02/21/17 2029

## 2017-02-21 NOTE — Telephone Encounter (Signed)
Patient not available at time of TCM call. Left message with patient's wife for patient to return call.

## 2017-02-23 ENCOUNTER — Telehealth: Payer: Self-pay | Admitting: *Deleted

## 2017-02-23 DIAGNOSIS — Z9359 Other cystostomy status: Secondary | ICD-10-CM | POA: Diagnosis not present

## 2017-02-23 DIAGNOSIS — Z87891 Personal history of nicotine dependence: Secondary | ICD-10-CM | POA: Diagnosis not present

## 2017-02-23 DIAGNOSIS — Z8546 Personal history of malignant neoplasm of prostate: Secondary | ICD-10-CM | POA: Diagnosis not present

## 2017-02-23 DIAGNOSIS — I2510 Atherosclerotic heart disease of native coronary artery without angina pectoris: Secondary | ICD-10-CM | POA: Diagnosis not present

## 2017-02-23 DIAGNOSIS — I255 Ischemic cardiomyopathy: Secondary | ICD-10-CM | POA: Diagnosis not present

## 2017-02-23 DIAGNOSIS — Z7982 Long term (current) use of aspirin: Secondary | ICD-10-CM | POA: Diagnosis not present

## 2017-02-23 DIAGNOSIS — K5790 Diverticulosis of intestine, part unspecified, without perforation or abscess without bleeding: Secondary | ICD-10-CM | POA: Diagnosis not present

## 2017-02-23 DIAGNOSIS — B965 Pseudomonas (aeruginosa) (mallei) (pseudomallei) as the cause of diseases classified elsewhere: Secondary | ICD-10-CM | POA: Diagnosis not present

## 2017-02-23 DIAGNOSIS — Z951 Presence of aortocoronary bypass graft: Secondary | ICD-10-CM | POA: Diagnosis not present

## 2017-02-23 DIAGNOSIS — M1990 Unspecified osteoarthritis, unspecified site: Secondary | ICD-10-CM | POA: Diagnosis not present

## 2017-02-23 DIAGNOSIS — I110 Hypertensive heart disease with heart failure: Secondary | ICD-10-CM | POA: Diagnosis not present

## 2017-02-23 DIAGNOSIS — K219 Gastro-esophageal reflux disease without esophagitis: Secondary | ICD-10-CM | POA: Diagnosis not present

## 2017-02-23 DIAGNOSIS — I5022 Chronic systolic (congestive) heart failure: Secondary | ICD-10-CM | POA: Diagnosis not present

## 2017-02-23 DIAGNOSIS — Z7901 Long term (current) use of anticoagulants: Secondary | ICD-10-CM | POA: Diagnosis not present

## 2017-02-23 DIAGNOSIS — F0390 Unspecified dementia without behavioral disturbance: Secondary | ICD-10-CM | POA: Diagnosis not present

## 2017-02-23 DIAGNOSIS — T8369XD Infection and inflammatory reaction due to other prosthetic device, implant and graft in genital tract, subsequent encounter: Secondary | ICD-10-CM | POA: Diagnosis not present

## 2017-02-23 DIAGNOSIS — E785 Hyperlipidemia, unspecified: Secondary | ICD-10-CM | POA: Diagnosis not present

## 2017-02-23 NOTE — Telephone Encounter (Signed)
Copied from [place]. Topic: General - Other >> Feb 23, 2017  1:15 PM  [place] wrote: Reason for CRM:  Cecile Hearing a PT with [facility] to ask for verbal orders 1 week 1 , 2 week 2, 1 week 3      [phone]

## 2017-02-23 NOTE — Telephone Encounter (Signed)
Per Dr Raliegh Ip verbal orders were given to [facility] a PT with [facility] [phone]) via detailed voice message for   PT 1 week 1 , 2 week 2, 1 week 3.

## 2017-02-28 DIAGNOSIS — B965 Pseudomonas (aeruginosa) (mallei) (pseudomallei) as the cause of diseases classified elsewhere: Secondary | ICD-10-CM | POA: Diagnosis not present

## 2017-02-28 DIAGNOSIS — T8369XD Infection and inflammatory reaction due to other prosthetic device, implant and graft in genital tract, subsequent encounter: Secondary | ICD-10-CM | POA: Diagnosis not present

## 2017-02-28 DIAGNOSIS — I2510 Atherosclerotic heart disease of native coronary artery without angina pectoris: Secondary | ICD-10-CM | POA: Diagnosis not present

## 2017-02-28 DIAGNOSIS — I255 Ischemic cardiomyopathy: Secondary | ICD-10-CM | POA: Diagnosis not present

## 2017-02-28 DIAGNOSIS — I110 Hypertensive heart disease with heart failure: Secondary | ICD-10-CM | POA: Diagnosis not present

## 2017-02-28 DIAGNOSIS — I5022 Chronic systolic (congestive) heart failure: Secondary | ICD-10-CM | POA: Diagnosis not present

## 2017-03-01 ENCOUNTER — Ambulatory Visit (INDEPENDENT_AMBULATORY_CARE_PROVIDER_SITE_OTHER): Payer: Medicare Other | Admitting: Internal Medicine

## 2017-03-01 ENCOUNTER — Encounter: Payer: Self-pay | Admitting: Internal Medicine

## 2017-03-01 VITALS — BP 130/76 | HR 54 | Temp 97.2°F | Ht 65.0 in | Wt 163.4 lb

## 2017-03-01 DIAGNOSIS — A419 Sepsis, unspecified organism: Secondary | ICD-10-CM

## 2017-03-01 DIAGNOSIS — N179 Acute kidney failure, unspecified: Secondary | ICD-10-CM

## 2017-03-01 DIAGNOSIS — I5022 Chronic systolic (congestive) heart failure: Secondary | ICD-10-CM

## 2017-03-01 DIAGNOSIS — T83510D Infection and inflammatory reaction due to cystostomy catheter, subsequent encounter: Secondary | ICD-10-CM | POA: Diagnosis not present

## 2017-03-01 DIAGNOSIS — N390 Urinary tract infection, site not specified: Secondary | ICD-10-CM

## 2017-03-01 MED ORDER — HYDROCORTISONE 2.5 % RE CREA: 1.0000 "application " | TOPICAL_CREAM | Freq: Two times a day (BID) | RECTAL | 0 refills | Status: DC

## 2017-03-01 NOTE — Progress Notes (Signed)
Subjective:    Patient ID: Ronald [name], male    DOB: 1927/04/27, 83 y.o.   MRN: [id number]  HPI   Admit date: 02/15/2017 Discharge date: 02/18/2017  Principal Problem:   Sepsis ([place]) Active Problems:   HYPERCHOLESTEROLEMIA   HTN (hypertension)   PROSTATE CANCER, HX OF   Chronic systolic CHF (congestive heart failure) (HCC)   Urinary tract infection associated with cystostomy catheter (HCC)   Suprapubic catheter (HCC)   AKI (acute kidney injury) ([place])  Paroxysmal atrial fibrillation: Rate controlled without medications, intermittently also with 1st deg AV block, no prolonged pauses.  - Started eliquis 5mg  BID - Will need cardiology follow up.   83 year old patient who is seen today following a hospital discharge  and for transitional care management.  He remains weak but seems to be improving.  He is scheduled to see cardiology in a couple of weeks. He was hospitalized for sepsis in the setting of UTI and chronic cystostomy catheter associated with atrial fibrillation.  His predischarge EKG revealed sinus bradycardia with a few PVCs presently he is on Eliquis anticoagulation and he has cost concerns.  He is [facility] at [facility]. He has had some rectal itching and this morning saw a small amount of blood with the tissue paper. He has completed antibiotic therapy  Hospital records reviewed.  CBC and renal indices normalized prior to his discharge except for mild thrombocytopenia  Past Medical History:  Diagnosis Date  . Arthritis    "joints ache" (01/02/2014)  . At risk for sleep apnea    STOP-BANG= 4    SENT TO PCP 12-23-2013  . Bladder calculi   . CHF (congestive heart failure) ([name])   . Chronic cystitis   . Coronary artery disease CARDIOLOGIST-  DR Grace Bushy  MI -- S/P  11/89 CABG x6 (70% circ; 90% PD; 60-70% distal left main; 30% left circ; 90% 1st diag;, 2nd diag and 3rd diag. w/90%,  mild stenosis LAD and 60-70% pLAD)  Re-do CABG x5 in 1997  .  Diverticulosis   . GERD (gastroesophageal reflux disease)   . History of Bell's palsy    RIGHT SIDE-- NO RESIDUAL  . Hx of dizziness   . Hyperlipidemia   . Hypertension    "not anymore" (01/02/2014)  . Ischemic cardiomyopathy    ef 35-40% per cath 08-28-2013  . Kidney stones "years ago"   "passed them"  . Melanoma of ear ([place])    "right"  . Mild dementia   . Myocardial infarction ([place]) 1986; 1997  . Nocturia   . OAB (overactive bladder)   . Prostate cancer ([place]) 1998   S/P  [place]; [place]  . S/P CABG (coronary artery bypass graft)    [place]  . Skipped heart beats    occasional  . Urethral stricture   . UTI (urinary tract infection) 09/2015  . Wears hearing aid    bilateral  . Wears partial dentures      Social History   Socioeconomic History  . Marital status: Married    Spouse name: Not on file  . Number of children: Not on file  . Years of education: Not on file  . Highest education level: Not on file  Social Needs  . Financial resource strain: Not on file  . Food insecurity - worry: Not on file  . Food insecurity - inability: Not on file  . Transportation needs -  medical: Not on file  . Transportation needs - non-medical: Not on file  Occupational History  . Not on file  Tobacco Use  . Smoking status: Former Smoker    Years: 40.00    Types: Cigars    Last attempt to quit: 12/24/1983    Years since quitting: 33.2  . Smokeless tobacco: Former Systems developer    Types: Chew    Quit date: 08/28/2013  . Tobacco comment: smoked a pipe  Substance and Sexual Activity  . Alcohol use: No  . Drug use: No  . Sexual activity: Not on file  Other Topics Concern  . Not on file  Social History Narrative   Was a Clinical cytogeneticist   Was in the [employer] x 4 years   Father died and felt he needed to come home to help his mother    Was asked to rejoin      2 children; boy and girl   3 grand children   One grandson in [place], one  grandson in [place];    [place] a job as Nurse, adult dtr; [place].     Past Surgical History:  Procedure Laterality Date  . CARDIAC CATHETERIZATION  02-22-2005  dr Vidal Schwalbe   mild to moderate lv dysfunction with inferobasilar akinesis/  totally occluded SVG to Intermediate Diagonal and SVG to PDA and RCA branches, totally occluded native coronary circulation with diffuse disease pLAD diagonal system with potentially could be ischemic/  patent SVG to OM with collaterals to dRCA and patent LIMA to LAD and diagonal systemss  . CARDIAC CATHETERIZATION  08-28-2013  DR Daneen Schick   widely patent sequential left internal graft to the diagonal/LAD, widely patent SVG to OM with proximal 50% narrowing noted in the graft, total occlusion SVG's to RCA, RI, and the Diagonal/ LV dysfunction with inferobasal aneurysm and mid anterior wall region of akinesis/  overall EF 35-40%/  Total occlusion of the navtive circulation/  no significant change compared to 2006 cath  . CARDIOVASCULAR STRESS TEST  08-01-2011  dr Angelena Form   inferior scar and possible soft tissue attenuation with minimal peri-infarct ischemia, small region of anterior ischemia and scar/  LVEF 53% LV wall motion with inferior hypokinesis/ no significant change from scan july 2011  . CATARACT EXTRACTION W/ INTRAOCULAR LENS  IMPLANT, BILATERAL Bilateral 2007  . CORONARY ARTERY BYPASS GRAFT  11/ [place]-- 6 vessel/  1997 Re-do 5 vessel  . CYSTOSCOPY WITH URETHRAL DILATATION N/A 12/25/2013   Procedure: CYSTOSCOPY WITH URETHRAL DILATATION, WITH BIOPSY;  Surgeon: Bernestine Amass, MD;  Location: [facility];  Service: Urology;  Laterality: N/A;  . CYSTOSCOPY WITH URETHRAL DILATATION N/A 12/22/2014   Procedure: CYSTOSCOPY WITH URETHRAL DILATATION;  Surgeon: Rana Snare, MD;  Location: [facility];  Service: Urology;  Laterality: N/A;  BALLOON DILATION CATHETER    . EUS  10/05/2011   Procedure: ESOPHAGEAL ENDOSCOPIC  ULTRASOUND (EUS) RADIAL;  Surgeon: Arta Silence, MD;  [facility]: [facility];  Service: Endoscopy;  Laterality: N/A;  . INSERTION OF SUPRAPUBIC CATHETER N/A 12/22/2014   Procedure: INSERTION OF SUPRAPUBIC CATHETER ;  Surgeon: Rana Snare, MD;  [facility];  Service: Urology;  Laterality: N/A;  . LAPAROSCOPIC CHOLECYSTECTOMY  12-23-2005  . LEFT HEART CATHETERIZATION WITH CORONARY/GRAFT ANGIOGRAM N/A 08/28/2013   Procedure: LEFT HEART CATHETERIZATION WITH Beatrix Fetters;  Surgeon: Sinclair Grooms, MD;  Location: [facility];  Service: Cardiovascular;  Laterality:  N/A;  . MELANOMA EXCISION  X 1   "ear"  . RADIOACTIVE PROSTATE SEED IMPLANTS  1998  . SKIN CANCER EXCISION  X 2   "top of head"  . TONSILLECTOMY AND ADENOIDECTOMY  1954    Family History  Problem Relation Age of Onset  . Heart disease Mother   . Diabetes Father   . Heart disease Brother     Allergies  Allergen Reactions  . Pantoprazole Other (See Comments)    Headache and lightheaded  . Nitrofurantoin Hives  . Sulfa Antibiotics Hives and Itching  . Lidocaine     Mouth swelling     Current Outpatient Medications on File Prior to Visit  Medication Sig Dispense Refill  . acetaminophen (TYLENOL) 500 MG tablet Take 1,000 mg by mouth every 6 (six) hours as needed for pain or fever.     Marland Kitchen apixaban (ELIQUIS) 5 MG TABS tablet Take 1 tablet (5 mg total) by mouth 2 (two) times daily. 60 tablet 0  . aspirin EC 81 MG EC tablet Take 1 tablet (81 mg total) by mouth daily. 30 tablet 0  . atorvastatin (LIPITOR) 80 MG tablet Take 1 tablet (80 mg total) by mouth every evening. 30 tablet 1  . Calcium Carbonate Antacid (TUMS PO) Take 3 tablets by mouth daily as needed (gas).    . Cholecalciferol (VITAMIN D) 2000 UNITS tablet Take 2,000 Units by mouth daily.    . flavoxATE (URISPAS) 100 MG tablet Take 100 mg by mouth 3 (three) times daily as needed for bladder spasms.    Marland Kitchen galantamine (RAZADYNE ER) 8 MG 24 hr capsule Take 8  mg by mouth daily with breakfast.    . hydroxypropyl methylcellulose (ISOPTO TEARS) 2.5 % ophthalmic solution Place 1 drop into both eyes 2 (two) times daily.     . Multiple Vitamin (MULTIVITAMIN WITH MINERALS) TABS Take 1 tablet by mouth daily.    . nitroGLYCERIN (NITROSTAT) 0.4 MG SL tablet Place 0.4 mg under the tongue every 5 (five) minutes as needed for chest pain.     . ranitidine (ZANTAC) 150 MG tablet Take 1 tablet (150 mg total) by mouth 2 (two) times daily.    . furosemide (LASIX) 20 MG tablet Take 1 tablet (20 mg total) by mouth daily as needed. For swelling 15 tablet 6   No current facility-administered medications on file prior to visit.     BP 130/76 (BP Location: Left Arm, Patient Position: Sitting, Cuff Size: Normal)   Pulse (!) 54   Temp (!) 97.2 F (36.2 C) (Oral)   Ht 5\' 5"  (1.651 m)   Wt 163 lb 6.4 oz (74.1 kg)   SpO2 97%   BMI 27.19 kg/m      Review of Systems  Constitutional: Positive for activity change, appetite change and fatigue. Negative for chills and fever.  HENT: Negative for congestion, dental problem, ear pain, hearing loss, sore throat, tinnitus, trouble swallowing and voice change.   Eyes: Negative for pain, discharge and visual disturbance.  Respiratory: Negative for cough, chest tightness, wheezing and stridor.   Cardiovascular: Negative for chest pain, palpitations and leg swelling.  Gastrointestinal: Negative for abdominal distention, abdominal pain, blood in stool, constipation, diarrhea, nausea and vomiting.  Genitourinary: Negative for difficulty urinating, discharge, flank pain, genital sores, hematuria and urgency.  Musculoskeletal: Negative for arthralgias, back pain, gait problem, joint swelling, myalgias and neck stiffness.  Skin: Negative for rash.  Neurological: Negative for dizziness, syncope, speech difficulty, weakness, numbness and headaches.  Hematological: Negative for adenopathy. Does not bruise/bleed easily.    Psychiatric/Behavioral: Negative for behavioral problems and dysphoric mood. The patient is not nervous/anxious.        Objective:   Physical Exam  Constitutional: He is oriented to person, place, and time. He appears well-developed. No distress.  Weak but no distress Requires only minimal assistance to transfer from a sitting position to the examining table  Afebrile Pulse 54  HENT:  Head: Normocephalic.  Right Ear: External ear normal.  Left Ear: External ear normal.  Hearing aid right ear  Eyes: Conjunctivae and EOM are normal.  Neck: Normal range of motion.  Cardiovascular: Normal rate, regular rhythm and normal heart sounds.  Slow regular rhythm with rare ectopics  Pulmonary/Chest: Breath sounds normal.  Abdominal: Bowel sounds are normal.  Status post suprapubic catheter  Musculoskeletal: Normal range of motion. He exhibits no edema or tenderness.  Neurological: He is alert and oriented to person, place, and time.  Psychiatric: He has a normal mood and affect. His behavior is normal.          Assessment & Plan:   Status post urosepsis Chronic suprapubic catheter Paroxysmal atrial fibrillation in the setting of sepsis syndrome.  Presently is in normal sinus rhythm.  Cardiology follow-up as scheduled in a couple weeks.  Patient is concerned about cost concerns of long-term anticoagulation.  He was told that if he remains in normal sinus rhythm, possibility of discontinuation of anticoagulation Chronic systolic heart failure compensated Status post acute renal injury stable  Follow-up 1 month Cardiology follow-up as scheduled  Nyoka Cowden

## 2017-03-01 NOTE — Patient Instructions (Signed)
Cardiology follow-up as scheduled  Return here in 1 month for follow-up  Urology follow-up as scheduled

## 2017-03-02 DIAGNOSIS — T8369XD Infection and inflammatory reaction due to other prosthetic device, implant and graft in genital tract, subsequent encounter: Secondary | ICD-10-CM | POA: Diagnosis not present

## 2017-03-02 DIAGNOSIS — I110 Hypertensive heart disease with heart failure: Secondary | ICD-10-CM | POA: Diagnosis not present

## 2017-03-02 DIAGNOSIS — B965 Pseudomonas (aeruginosa) (mallei) (pseudomallei) as the cause of diseases classified elsewhere: Secondary | ICD-10-CM | POA: Diagnosis not present

## 2017-03-02 DIAGNOSIS — I5022 Chronic systolic (congestive) heart failure: Secondary | ICD-10-CM | POA: Diagnosis not present

## 2017-03-02 DIAGNOSIS — I255 Ischemic cardiomyopathy: Secondary | ICD-10-CM | POA: Diagnosis not present

## 2017-03-02 DIAGNOSIS — I2510 Atherosclerotic heart disease of native coronary artery without angina pectoris: Secondary | ICD-10-CM | POA: Diagnosis not present

## 2017-03-03 ENCOUNTER — Ambulatory Visit: Payer: Medicare Other | Admitting: Physician Assistant

## 2017-03-08 DIAGNOSIS — I5022 Chronic systolic (congestive) heart failure: Secondary | ICD-10-CM | POA: Diagnosis not present

## 2017-03-08 DIAGNOSIS — I110 Hypertensive heart disease with heart failure: Secondary | ICD-10-CM | POA: Diagnosis not present

## 2017-03-08 DIAGNOSIS — I2510 Atherosclerotic heart disease of native coronary artery without angina pectoris: Secondary | ICD-10-CM | POA: Diagnosis not present

## 2017-03-08 DIAGNOSIS — B965 Pseudomonas (aeruginosa) (mallei) (pseudomallei) as the cause of diseases classified elsewhere: Secondary | ICD-10-CM | POA: Diagnosis not present

## 2017-03-08 DIAGNOSIS — T8369XD Infection and inflammatory reaction due to other prosthetic device, implant and graft in genital tract, subsequent encounter: Secondary | ICD-10-CM | POA: Diagnosis not present

## 2017-03-08 DIAGNOSIS — I255 Ischemic cardiomyopathy: Secondary | ICD-10-CM | POA: Diagnosis not present

## 2017-03-09 DIAGNOSIS — I5022 Chronic systolic (congestive) heart failure: Secondary | ICD-10-CM | POA: Diagnosis not present

## 2017-03-09 DIAGNOSIS — I110 Hypertensive heart disease with heart failure: Secondary | ICD-10-CM | POA: Diagnosis not present

## 2017-03-09 DIAGNOSIS — I255 Ischemic cardiomyopathy: Secondary | ICD-10-CM | POA: Diagnosis not present

## 2017-03-09 DIAGNOSIS — T8369XD Infection and inflammatory reaction due to other prosthetic device, implant and graft in genital tract, subsequent encounter: Secondary | ICD-10-CM | POA: Diagnosis not present

## 2017-03-09 DIAGNOSIS — B965 Pseudomonas (aeruginosa) (mallei) (pseudomallei) as the cause of diseases classified elsewhere: Secondary | ICD-10-CM | POA: Diagnosis not present

## 2017-03-09 DIAGNOSIS — I2510 Atherosclerotic heart disease of native coronary artery without angina pectoris: Secondary | ICD-10-CM | POA: Diagnosis not present

## 2017-03-14 DIAGNOSIS — I255 Ischemic cardiomyopathy: Secondary | ICD-10-CM | POA: Diagnosis not present

## 2017-03-14 DIAGNOSIS — I110 Hypertensive heart disease with heart failure: Secondary | ICD-10-CM | POA: Diagnosis not present

## 2017-03-14 DIAGNOSIS — B965 Pseudomonas (aeruginosa) (mallei) (pseudomallei) as the cause of diseases classified elsewhere: Secondary | ICD-10-CM | POA: Diagnosis not present

## 2017-03-14 DIAGNOSIS — I2510 Atherosclerotic heart disease of native coronary artery without angina pectoris: Secondary | ICD-10-CM | POA: Diagnosis not present

## 2017-03-14 DIAGNOSIS — T8369XD Infection and inflammatory reaction due to other prosthetic device, implant and graft in genital tract, subsequent encounter: Secondary | ICD-10-CM | POA: Diagnosis not present

## 2017-03-14 DIAGNOSIS — I5022 Chronic systolic (congestive) heart failure: Secondary | ICD-10-CM | POA: Diagnosis not present

## 2017-03-15 DIAGNOSIS — N35112 Postinfective bulbous urethral stricture, not elsewhere classified: Secondary | ICD-10-CM | POA: Diagnosis not present

## 2017-03-16 NOTE — Progress Notes (Signed)
Cardiology Office Note   Date:  03/17/2017   ID:  Ronald [name], DOB 1927-06-13, MRN [id number]  PCP:  Marletta Lor, MD  Cardiologist:  Dr. Angelena Form    Chief Complaint  Patient presents with  . Hospitalization Follow-up      History of Present Illness: Ronald [name] is a 83 y.o. male who presents for post hospitalization for sepsis.  During the stay pt was noted to have a fib and Dr. Caryl Comes recommended anticoagulation and follow up with Cards.  Sepsis was due to UTI. He has suprapubic catheter.   The rate of a fib was controlled without medication.   With the a fib he had some pauses as well.    Pt has a history of CAD s/p CABG in 1989 and redo bypass in 1997 per Dr. Redmond Pulling, nephrolithiasis, HTN, HLD, mild dementia, prostate cancer here today for cardiac follow up after hospitalization for sepsis from UTI.Marland Kitchen He has been followed in the past by Dr. Doreatha Lew. Leane Call May 2013 because of dyspnea and fatigue. This showed inferior scar with soft tissue attenuation and small region of anterior scar with possible small area of ischemia. This was felt to be low risk. He was seen in November 2014 and c/o fatigue and was hypotensive. Lisinopril was stopped and this resolved. Admitted to [facility] 2015 with chest pain. Cardiac cath 08/28/13 with 95% left main stenosis, occluded LAD, occluded Circumflex, occluded RCA. Patent LIMA to LAD, patent SVG to OM supplying collaterals to RCA. Occluded SVG to RCA and SVG to intermediate branch. Medical therapy recommended. LVEF=35-40% by LVgram. His pain resolved with addition of Zantac, felt to be GI related. He has had several admissions in 2015-2017 with urosepsis with known history of kidney stones and urethral stricture. Echo October 2017 with LVEF=40-45%, mild MR.   Today he feels much better, has been off antibiotics for 2 weeks.  He is frustrated with the suprapubic cath and with kinks has to go to ER.  No chest pain and no SOB. He has had  some bleeding on face with shaving on eliquis and he is still on ASA.  He has hemorrhoids and has seen PCP for this and constipation.     Past Medical History:  Diagnosis Date  . Arthritis    "joints ache" (01/02/2014)  . At risk for sleep apnea    STOP-BANG= 4    SENT TO PCP 12-23-2013  . Bladder calculi   . CHF (congestive heart failure) ([place])   . Chronic cystitis   . Coronary artery disease CARDIOLOGIST-  DR Grace Bushy  MI -- S/P  11/89 CABG x6 (70% circ; 90% PD; 60-70% distal left main; 30% left circ; 90% 1st diag;, 2nd diag and 3rd diag. w/90%,  mild stenosis LAD and 60-70% pLAD)  Re-do CABG x5 in 1997  . Diverticulosis   . GERD (gastroesophageal reflux disease)   . History of Bell's palsy    RIGHT SIDE-- NO RESIDUAL  . Hx of dizziness   . Hyperlipidemia   . Hypertension    "not anymore" (01/02/2014)  . Ischemic cardiomyopathy    ef 35-40% per cath 08-28-2013  . Kidney stones "years ago"   "passed them"  . Melanoma of ear ([place])    "right"  . Mild dementia   . Myocardial infarction ([place]) 1986; 1997  . Nocturia   . OAB (overactive bladder)   . Prostate cancer ([place]) 1998   S/P  RADIACTIVE SEED IMPLANTS; UROLOGIST-  DR Risa Grill  . S/P CABG (coronary artery bypass graft)    [place]  . Skipped heart beats    occasional  . Urethral stricture   . UTI (urinary tract infection) 09/2015  . Wears hearing aid    bilateral  . Wears partial dentures     Past Surgical History:  Procedure Laterality Date  . CARDIAC CATHETERIZATION  02-22-2005  dr Vidal Schwalbe   mild to moderate lv dysfunction with inferobasilar akinesis/  totally occluded SVG to Intermediate Diagonal and SVG to PDA and RCA branches, totally occluded native coronary circulation with diffuse disease pLAD diagonal system with potentially could be ischemic/  patent SVG to OM with collaterals to dRCA and patent LIMA to LAD and diagonal systemss  . CARDIAC CATHETERIZATION  08-28-2013  DR Daneen Schick   widely  patent sequential left internal graft to the diagonal/LAD, widely patent SVG to OM with proximal 50% narrowing noted in the graft, total occlusion SVG's to RCA, RI, and the Diagonal/ LV dysfunction with inferobasal aneurysm and mid anterior wall region of akinesis/  overall EF 35-40%/  Total occlusion of the navtive circulation/  no significant change compared to 2006 cath  . CARDIOVASCULAR STRESS TEST  08-01-2011  dr Angelena Form   inferior scar and possible soft tissue attenuation with minimal peri-infarct ischemia, small region of anterior ischemia and scar/  LVEF 53% LV wall motion with inferior hypokinesis/ no significant change from scan july 2011  . CATARACT EXTRACTION W/ INTRAOCULAR LENS  IMPLANT, BILATERAL Bilateral 2007  . CORONARY ARTERY BYPASS GRAFT  11/ [place]-- 6 vessel/  1997 Re-do 5 vessel  . CYSTOSCOPY WITH URETHRAL DILATATION N/A 12/25/2013   Procedure: CYSTOSCOPY WITH URETHRAL DILATATION, WITH BIOPSY;  Surgeon: Bernestine Amass, MD;  Location: [facility];  Service: Urology;  Laterality: N/A;  . CYSTOSCOPY WITH URETHRAL DILATATION N/A 12/22/2014   Procedure: CYSTOSCOPY WITH URETHRAL DILATATION;  Surgeon: Rana Snare, MD;  [facility];  Service: Urology;  Laterality: N/A;  BALLOON DILATION CATHETER    . EUS  10/05/2011   Procedure: ESOPHAGEAL ENDOSCOPIC ULTRASOUND (EUS) RADIAL;  Surgeon: Arta Silence, MD;  [facility];  Service: Endoscopy;  Laterality: N/A;  . INSERTION OF SUPRAPUBIC CATHETER N/A 12/22/2014   Procedure: INSERTION OF SUPRAPUBIC CATHETER ;  Surgeon: Rana Snare, MD;  [facility];  Service: Urology;  Laterality: N/A;  . LAPAROSCOPIC CHOLECYSTECTOMY  12-23-2005  . LEFT HEART CATHETERIZATION WITH CORONARY/GRAFT ANGIOGRAM N/A 08/28/2013   Procedure: LEFT HEART CATHETERIZATION WITH Beatrix Fetters;  Surgeon: Sinclair Grooms, MD;  Location: [facility] CATH LAB;  Service: Cardiovascular;  Laterality: N/A;  . MELANOMA  EXCISION  X 1   "ear"  . RADIOACTIVE PROSTATE SEED IMPLANTS  1998  . SKIN CANCER EXCISION  X 2   "top of head"  . TONSILLECTOMY AND ADENOIDECTOMY  1954     Current Outpatient Medications  Medication Sig Dispense Refill  . acetaminophen (TYLENOL) 500 MG tablet Take 1,000 mg by mouth every 6 (six) hours as needed for pain or fever.     Marland Kitchen apixaban (ELIQUIS) 5 MG TABS tablet Take 1 tablet (5 mg total) by mouth 2 (two) times daily. 60 tablet 0  . aspirin EC 81 MG EC tablet Take 1 tablet (81 mg total) by mouth daily. 30 tablet 0  . atorvastatin (LIPITOR) 80 MG tablet Take 1 tablet (80 mg total) by mouth every evening. [place]  tablet 1  . Calcium Carbonate Antacid (TUMS PO) Take 3 tablets by mouth daily as needed (gas).    . galantamine (RAZADYNE ER) 8 MG 24 hr capsule Take 8 mg by mouth daily with breakfast.    . hydrocortisone (ANUSOL-HC) 2.5 % rectal cream Place 1 application rectally 2 (two) times daily. 30 g 0  . hydroxypropyl methylcellulose (ISOPTO TEARS) 2.5 % ophthalmic solution Place 1 drop into both eyes 2 (two) times daily.     . Multiple Vitamin (MULTIVITAMIN WITH MINERALS) TABS Take 1 tablet by mouth daily.    . nitroGLYCERIN (NITROSTAT) 0.4 MG SL tablet Place 0.4 mg under the tongue every 5 (five) minutes as needed for chest pain.     . ranitidine (ZANTAC) 150 MG tablet Take 1 tablet (150 mg total) by mouth 2 (two) times daily.    . furosemide (LASIX) 20 MG tablet Take 1 tablet (20 mg total) by mouth daily as needed. For swelling 15 tablet 6   No current facility-administered medications for this visit.     Allergies:   Pantoprazole; Nitrofurantoin; Sulfa antibiotics; and Lidocaine    Social History:  The patient  reports that he quit smoking about 33 years ago. His smoking use included cigars. He quit after 40.00 years of use. He quit smokeless tobacco use about 3 years ago. His smokeless tobacco use included chew. He reports that he does not drink alcohol or use drugs.   Family  History:  The patient's family history includes Diabetes in his father; Heart disease in his brother and mother.    ROS:  General:no colds or fevers, + weight loss Skin:no rashes or ulcers areas on face with irritated skin from shaving. HEENT:no blurred vision, no congestion CV:see HPI PUL:see HPI GI:no diarrhea +constipation no melena, no indigestion GU:no hematuria, no dysuria- with supra pubic cath MS:no joint pain, no claudication Neuro:no syncope, no lightheadedness Endo:no diabetes, no thyroid disease  Wt Readings from Last 3 Encounters:  03/17/17 160 lb 6.4 oz (72.8 kg)  03/01/17 163 lb 6.4 oz (74.1 kg)  02/18/17 170 lb 13.7 oz (77.5 kg)     PHYSICAL EXAM: VS:  BP 122/60   Pulse (!) 55   Ht 5\' 5"  (1.651 m)   Wt 160 lb 6.4 oz (72.8 kg)   SpO2 97%   BMI 26.69 kg/m  , BMI Body mass index is 26.69 kg/m. General:Pleasant affect, NAD Skin:Warm and dry, brisk capillary refill HEENT:normocephalic, sclera clear, mucus membranes moist Neck:supple, no JVD, no bruits  Heart:S1S2 RRR without murmur, gallup, rub or click Lungs:clear without rales, rhonchi, or wheezes YSA:YTKZ, non tender, + BS, do not palpate liver spleen or masses Ext:no lower ext edema, 2+ pedal pulses, 2+ radial pulses Neuro:alert and oriented X 3, MAE, follows commands, + facial symmetry    EKG:  EKG is ordered today. The ekg ordered today demonstrates SB with 1st degree AV block LVH     Recent Labs: 02/16/2017: B Natriuretic Peptide 351.6 02/19/2017: ALT 48; BUN 24; Creatinine, Ser 0.98; Hemoglobin 13.0; Platelets 130; Potassium 4.1; Sodium 140    Lipid Panel    Component Value Date/Time   CHOL 133 07/23/2015 0755   TRIG 49 07/23/2015 0755   HDL 52 07/23/2015 0755   CHOLHDL 2.6 07/23/2015 0755   VLDL 10 07/23/2015 0755   LDLCALC 71 07/23/2015 0755       Other studies Reviewed: Additional studies/ records that were reviewed today include: . ECHO  02/17/17  Study Conclusions  - Left  ventricle: septal hypokinesis Moderate basal septal   hypertrophy. The cavity size was normal. Systolic function was   normal. The estimated ejection fraction was in the range of 50%   to 55%. Wall motion was normal; there were no regional wall   motion abnormalities. Left ventricular diastolic function   parameters were normal. - Left atrium: The atrium was mildly dilated. - Atrial septum: No defect or patent foramen ovale was identified.  Impressions:  - There was no evidence of a vegetation.    ASSESSMENT AND PLAN:  1.  PAF was in SR when discharged 02/18/17 he is on eliquis and asa 81 mg.  Currently in SB.  Discussed with Dr. Caryl Comes today and an article in [place] on a fib did not show problems with stopping eliquis vs continuing with bleeding.  He recommended 30 day event monitor to eval a fib burden and then have discussion of stopping and possible CVA.  In the meantime he will continue Eliquis and stop the ASA.  He will follow up with Dr. Angelena Form post event monitor.    2.  CAD with hx of CABG and redo CABG no angina.  No BB due to bradycardia.  No ACE due to hypotension.    3.  HLD continue statin.  4.  ICM though does have improvement of EF to 50-55%.    5.  Recent sepsis due to UTI has resolved all secondary to supra pubic cath.     Current medicines are reviewed with the patient today.  The patient Has no concerns regarding medicines.  The following changes have been made:  See above Labs/ tests ordered today include:see above  Disposition:   FU:  see above  Signed, Cecilie Kicks, NP  03/17/2017 9:45 AM    [address], [place], [place], [place] Phone: [phone]; Fax: [phone]

## 2017-03-17 ENCOUNTER — Ambulatory Visit (INDEPENDENT_AMBULATORY_CARE_PROVIDER_SITE_OTHER): Payer: Medicare Other | Admitting: Cardiology

## 2017-03-17 ENCOUNTER — Encounter: Payer: Self-pay | Admitting: Cardiology

## 2017-03-17 VITALS — BP 122/60 | HR 55 | Ht 65.0 in | Wt 160.4 lb

## 2017-03-17 DIAGNOSIS — I255 Ischemic cardiomyopathy: Secondary | ICD-10-CM | POA: Diagnosis not present

## 2017-03-17 DIAGNOSIS — Z8619 Personal history of other infectious and parasitic diseases: Secondary | ICD-10-CM

## 2017-03-17 DIAGNOSIS — E7800 Pure hypercholesterolemia, unspecified: Secondary | ICD-10-CM | POA: Diagnosis not present

## 2017-03-17 DIAGNOSIS — I5022 Chronic systolic (congestive) heart failure: Secondary | ICD-10-CM | POA: Diagnosis not present

## 2017-03-17 DIAGNOSIS — I25810 Atherosclerosis of coronary artery bypass graft(s) without angina pectoris: Secondary | ICD-10-CM

## 2017-03-17 DIAGNOSIS — I4891 Unspecified atrial fibrillation: Secondary | ICD-10-CM | POA: Diagnosis not present

## 2017-03-17 DIAGNOSIS — I10 Essential (primary) hypertension: Secondary | ICD-10-CM

## 2017-03-17 MED ORDER — APIXABAN 5 MG PO TABS: 5.0000 mg | ORAL_TABLET | Freq: Two times a day (BID) | ORAL | 3 refills | Status: DC

## 2017-03-17 NOTE — Patient Instructions (Addendum)
Medication Instructions:  Your physician has recommended you make the following change in your medication:  1.  STOP the Aspirin  Labwork: None ordered  Testing/Procedures: Your physician has recommended that you wear an event monitor. Event monitors are medical devices that record the heart's electrical activity. Doctors most often [place] these monitors to diagnose arrhythmias. Arrhythmias are problems with the speed or rhythm of the heartbeat. The monitor is a small, portable device. You can wear one while you do your normal daily activities. This is usually used to diagnose what is causing palpitations/syncope (passing out).    Follow-Up: Your physician recommends that you schedule a follow-up appointment in: [place]   Any Other Special Instructions Will Be Listed Below (If Applicable).   Cardiac Event Monitoring A cardiac event monitor is a small recording device that is used to detect abnormal heart rhythms (arrhythmias). The monitor is used to record your heart rhythm when you have symptoms, such as:  Fast heartbeats (palpitations), such as heart racing or fluttering.  Dizziness.  Fainting or light-headedness.  Unexplained weakness.  Some monitors are wired to electrodes placed on your chest. Electrodes are flat, sticky disks that attach to your skin. Other monitors may be hand-held or worn on the wrist. The monitor can be worn for up to 30 days. If the monitor is attached to your chest, a technician will prepare your chest for the electrode placement and show you how to work the monitor. Take time to practice using the monitor before you leave the office. Make sure you understand how to send the information from the monitor to your health care provider. In some cases, you may need to use a landline telephone instead of a cell phone. What are the risks? Generally, this device is safe to use, but it possible that the skin under the electrodes will become  irritated. How to use your cardiac event monitor  Wear your monitor at all times, except when you are in water: ? Do not let the monitor get wet. ? Take the monitor off when you bathe. Do not swim or use a hot tub with it on.  Keep your skin clean. Do not put body lotion or moisturizer on your chest.  Change the electrodes as told by your health care provider or any time they stop sticking to your skin. You may need to use medical tape to keep them on.  Try to put the electrodes in slightly different places on your chest to help prevent skin irritation. They must remain in the area under your left breast and in the upper right section of your chest.  Make sure the monitor is safely clipped to your clothing or in a location close to your body that your health care provider recommends.  Press the button to record as soon as you feel heart-related symptoms, such as: ? Dizziness. ? Weakness. ? Light-headedness. ? Palpitations. ? Thumping or pounding in your chest. ? Shortness of breath. ? Unexplained weakness.  Keep a diary of your activities, such as walking, doing chores, and taking medicine. It is very important to note what you were doing when you pushed the button to record your symptoms. This will help your health care provider determine what might be contributing to your symptoms.  Send the recorded information as recommended by your health care provider. It may take some time for your health care provider to process the results.  Change the batteries as told by your health care provider.  Keep electronic devices away from your monitor. This includes: ? Tablets. ? MP3 players. ? Cell phones.  While wearing your monitor you should avoid: ? Electric blankets. ? Armed forces operational officer. ? Electric toothbrushes. ? Microwave ovens. ? Magnets. ? Metal detectors. Get help right away if:  You have chest pain.  You have extreme difficulty breathing or shortness of breath.  You  develop a very fast heartbeat that persists.  You develop dizziness that does not go away.  You faint or constantly feel like you are about to faint. Summary  A cardiac event monitor is a small recording device that is used to help detect abnormal heart rhythms (arrhythmias).  The monitor is used to record your heart rhythm when you have heart-related symptoms.  Make sure you understand how to send the information from the monitor to your health care provider.  It is important to press the button on the monitor when you have any heart-related symptoms.  Keep a diary of your activities, such as walking, doing chores, and taking medicine. It is very important to note what you were doing when you pushed the button to record your symptoms. This will help your health care provider learn what might be causing your symptoms. This information is not intended to replace advice given to you by your health care provider. Make sure you discuss any questions you have with your health care provider. Document Released: 12/22/2007 Document Revised: 02/27/2016 Document Reviewed: 02/27/2016 Elsevier Interactive Patient Education  2017 [employer].    If you need a refill on your cardiac medications before your next appointment, please call your pharmacy.

## 2017-03-24 ENCOUNTER — Ambulatory Visit (INDEPENDENT_AMBULATORY_CARE_PROVIDER_SITE_OTHER): Payer: Medicare Other

## 2017-03-24 DIAGNOSIS — I4891 Unspecified atrial fibrillation: Secondary | ICD-10-CM

## 2017-03-27 DIAGNOSIS — I255 Ischemic cardiomyopathy: Secondary | ICD-10-CM | POA: Diagnosis not present

## 2017-03-27 DIAGNOSIS — B965 Pseudomonas (aeruginosa) (mallei) (pseudomallei) as the cause of diseases classified elsewhere: Secondary | ICD-10-CM | POA: Diagnosis not present

## 2017-03-27 DIAGNOSIS — I5022 Chronic systolic (congestive) heart failure: Secondary | ICD-10-CM | POA: Diagnosis not present

## 2017-03-27 DIAGNOSIS — I2510 Atherosclerotic heart disease of native coronary artery without angina pectoris: Secondary | ICD-10-CM | POA: Diagnosis not present

## 2017-03-27 DIAGNOSIS — I110 Hypertensive heart disease with heart failure: Secondary | ICD-10-CM | POA: Diagnosis not present

## 2017-03-27 DIAGNOSIS — T8369XD Infection and inflammatory reaction due to other prosthetic device, implant and graft in genital tract, subsequent encounter: Secondary | ICD-10-CM | POA: Diagnosis not present

## 2017-04-13 DIAGNOSIS — N35812 Other urethral bulbous stricture, male: Secondary | ICD-10-CM | POA: Diagnosis not present

## 2017-04-27 ENCOUNTER — Telehealth: Payer: Self-pay | Admitting: *Deleted

## 2017-04-27 NOTE — Telephone Encounter (Signed)
Left message to call back  

## 2017-04-27 NOTE — Telephone Encounter (Signed)
I spoke with pt's wife and reviewed monitor results and instructions from Dr. Angelena Form with her.  She reports pt is continuing to have bleeding and pain around cath site.  Pt is following up with his physician for this. Wife reports pt has area on face that looks like a blood blister. He is going to see dermatology for this.  Wife is asking what to do if pt needs treatment for this and Xarelto needs to be stopped.  I told her dermatologist would contact our office with request to stop Xarelto.

## 2017-04-27 NOTE — Telephone Encounter (Signed)
-----   Message from Burnell Blanks, MD sent at 04/27/2017 11:08 AM EST ----- Several episodes of atrial fib on monitor. I would continue Eliquis. See Laura's note and her discussion with Caryl Comes. cdm

## 2017-05-02 DIAGNOSIS — D0439 Carcinoma in situ of skin of other parts of face: Secondary | ICD-10-CM | POA: Diagnosis not present

## 2017-05-02 DIAGNOSIS — L570 Actinic keratosis: Secondary | ICD-10-CM | POA: Diagnosis not present

## 2017-05-02 DIAGNOSIS — C44329 Squamous cell carcinoma of skin of other parts of face: Secondary | ICD-10-CM | POA: Diagnosis not present

## 2017-05-08 ENCOUNTER — Encounter: Payer: Self-pay | Admitting: Cardiovascular Disease

## 2017-05-08 ENCOUNTER — Ambulatory Visit (INDEPENDENT_AMBULATORY_CARE_PROVIDER_SITE_OTHER): Payer: Medicare Other | Admitting: Cardiovascular Disease

## 2017-05-08 VITALS — BP 118/70 | HR 47 | Ht 65.0 in | Wt 157.1 lb

## 2017-05-08 DIAGNOSIS — I10 Essential (primary) hypertension: Secondary | ICD-10-CM | POA: Diagnosis not present

## 2017-05-08 DIAGNOSIS — I480 Paroxysmal atrial fibrillation: Secondary | ICD-10-CM

## 2017-05-08 DIAGNOSIS — I255 Ischemic cardiomyopathy: Secondary | ICD-10-CM | POA: Diagnosis not present

## 2017-05-08 DIAGNOSIS — E7800 Pure hypercholesterolemia, unspecified: Secondary | ICD-10-CM

## 2017-05-08 DIAGNOSIS — I25118 Atherosclerotic heart disease of native coronary artery with other forms of angina pectoris: Secondary | ICD-10-CM

## 2017-05-08 DIAGNOSIS — I209 Angina pectoris, unspecified: Secondary | ICD-10-CM

## 2017-05-08 MED ORDER — APIXABAN 5 MG PO TABS: 5.0000 mg | ORAL_TABLET | Freq: Two times a day (BID) | ORAL | 11 refills | Status: DC

## 2017-05-08 MED ORDER — FUROSEMIDE 20 MG PO TABS: 20.0000 mg | ORAL_TABLET | Freq: Every day | ORAL | 6 refills | Status: DC | PRN

## 2017-05-08 NOTE — Patient Instructions (Addendum)
Medication Instructions:  Your physician recommends that you continue on your current medications as directed. Please refer to the Current Medication list given to you today.   Labwork: Your physician recommends that you return for lab work on 05/10/17  (lipid and liver profiles).  This is fasting.  The lab opens at 7:30 AM   Testing/Procedures: none  Follow-Up: Your physician recommends that you schedule a follow-up appointment in: 6 months. Please call our office in about 3 months to schedule this appointment    Any Other Special Instructions Will Be Listed Below (If Applicable).     If you need a refill on your cardiac medications before your next appointment, please call your pharmacy.

## 2017-05-08 NOTE — Progress Notes (Signed)
Chief Complaint  Patient presents with  . Coronary Artery Disease    History of Present Illness: 84 yo male with history of CAD s/p CABG in 1989 and redo bypass in 1997, HTN, HLD, mild dementia, prostate cancer and atrial fibrillation here today for cardiac follow up. He was admitted to [facility] June 2015 with chest pain. Cardiac cath 08/28/13 with 95% left main stenosis, occluded LAD, occluded Circumflex, occluded RCA. Patent LIMA to LAD, patent SVG to OM supplying collaterals to RCA. Occluded SVG to RCA and SVG to intermediate branch. Medical therapy recommended. LVEF=35-40% by LVgram in 2015. His pain resolved with addition of Zantac. He had several admissions in 2015-2017 with urosepsis with known history of kidney stones and urethral stricture. Echo October 2017 with LVEF=40-45%, mild MR. He was admitted to [facility] November 2018 with recurrent urosepsis and atrial fibrillation was noted. Dr. Caryl Comes provided a curbside consult and recommended that the patient be started on Eliquis. He was discharged on Eliquis. ASA was stopped. Cardiac monitor December 2018 with episodes of atrial fibrillation.   He is here today for follow up. The patient denies any chest pain, dyspnea, palpitations, lower extremity edema, orthopnea, PND, dizziness, near syncope or syncope. He has some oozing around his suprapubic catheter. He has blood on his underwear. No other bleeding.    Primary Care Physician: Marletta Lor, MD   Past Medical History:  Diagnosis Date  . Arthritis    "joints ache" (01/02/2014)  . At risk for sleep apnea    STOP-BANG= 4    SENT TO PCP 12-23-2013  . Bladder calculi   . CHF (congestive heart failure) ([place])   . Chronic cystitis   . Coronary artery disease CARDIOLOGIST-  DR Grace Bushy  [place] -- S/P  11/89 CABG x6 (70% circ; 90% PD; 60-70% distal left main; 30% left circ; 90% 1st diag;, 2nd diag and 3rd diag. w/90%,  mild stenosis LAD and 60-70% pLAD)  Re-do CABG x5 in 1997  .  Diverticulosis   . GERD (gastroesophageal reflux disease)   . History of Bell's palsy    RIGHT SIDE-- NO RESIDUAL  . Hx of dizziness   . Hyperlipidemia   . Hypertension    "not anymore" (01/02/2014)  . Ischemic cardiomyopathy    ef 35-40% per cath 08-28-2013  . Kidney stones "years ago"   "passed them"  . Melanoma of ear ([place])    "right"  . Mild dementia   . Myocardial infarction ([place]) 1986; 1997  . Nocturia   . OAB (overactive bladder)   . Prostate cancer ([place]) 1998   S/P  [place]; [place]  . S/P CABG (coronary artery bypass graft)    [place]  . Skipped heart beats    occasional  . Urethral stricture   . UTI (urinary tract infection) 09/2015  . Wears hearing aid    bilateral  . Wears partial dentures     Past Surgical History:  Procedure Laterality Date  . CARDIAC CATHETERIZATION  02-22-2005  dr Vidal Schwalbe   mild to moderate lv dysfunction with inferobasilar akinesis/  totally occluded SVG to Intermediate Diagonal and SVG to PDA and RCA branches, totally occluded native coronary circulation with diffuse disease pLAD diagonal system with potentially could be ischemic/  patent SVG to OM with collaterals to dRCA and patent LIMA to LAD and diagonal systemss  . CARDIAC CATHETERIZATION  08-28-2013  DR Daneen Schick  widely patent sequential left internal graft to the diagonal/LAD, widely patent SVG to OM with proximal 50% narrowing noted in the graft, total occlusion SVG's to RCA, RI, and the Diagonal/ LV dysfunction with inferobasal aneurysm and mid anterior wall region of akinesis/  overall EF 35-40%/  Total occlusion of the navtive circulation/  no significant change compared to 2006 cath  . CARDIOVASCULAR STRESS TEST  08-01-2011  dr Angelena Form   inferior scar and possible soft tissue attenuation with minimal peri-infarct ischemia, small region of anterior ischemia and scar/  LVEF 53% LV wall motion with inferior hypokinesis/ no significant  change from scan july 2011  . CATARACT EXTRACTION W/ INTRAOCULAR LENS  IMPLANT, BILATERAL Bilateral 2007  . CORONARY ARTERY BYPASS GRAFT  11/ [place]-- 6 vessel/  1997 Re-do 5 vessel  . CYSTOSCOPY WITH URETHRAL DILATATION N/A 12/25/2013   Procedure: CYSTOSCOPY WITH URETHRAL DILATATION, WITH BIOPSY;  Surgeon: Bernestine Amass, MD;  Location: [facility];  Service: Urology;  Laterality: N/A;  . CYSTOSCOPY WITH URETHRAL DILATATION N/A 12/22/2014   Procedure: CYSTOSCOPY WITH URETHRAL DILATATION;  Surgeon: Rana Snare, MD;  Location: [facility];  Service: Urology;  Laterality: N/A;  BALLOON DILATION CATHETER    . EUS  10/05/2011   Procedure: ESOPHAGEAL ENDOSCOPIC ULTRASOUND (EUS) RADIAL;  Surgeon: Arta Silence, MD;  Location: [facility];  Service: Endoscopy;  Laterality: N/A;  . INSERTION OF SUPRAPUBIC CATHETER N/A 12/22/2014   Procedure: INSERTION OF SUPRAPUBIC CATHETER ;  Surgeon: Rana Snare, MD;  Location: [facility];  Service: Urology;  Laterality: N/A;  . LAPAROSCOPIC CHOLECYSTECTOMY  12-23-2005  . LEFT HEART CATHETERIZATION WITH CORONARY/GRAFT ANGIOGRAM N/A 08/28/2013   Procedure: LEFT HEART CATHETERIZATION WITH Beatrix Fetters;  Surgeon: Sinclair Grooms, MD;  Location: [facility] CATH LAB;  Service: Cardiovascular;  Laterality: N/A;  . MELANOMA EXCISION  X 1   "ear"  . RADIOACTIVE PROSTATE SEED IMPLANTS  1998  . SKIN CANCER EXCISION  X 2   "top of head"  . TONSILLECTOMY AND ADENOIDECTOMY  1954    Current Outpatient Medications  Medication Sig Dispense Refill  . acetaminophen (TYLENOL) 500 MG tablet Take 1,000 mg by mouth every 6 (six) hours as needed for pain or fever.     Marland Kitchen apixaban (ELIQUIS) 5 MG TABS tablet Take 1 tablet (5 mg total) by mouth 2 (two) times daily. 60 tablet 11  . atorvastatin (LIPITOR) 80 MG tablet Take 1 tablet (80 mg total) by mouth every evening. 30 tablet 1  . Calcium Carbonate Antacid (TUMS PO) Take 3 tablets by mouth daily as needed  (gas).    . galantamine (RAZADYNE ER) 8 MG 24 hr capsule Take 8 mg by mouth daily with breakfast.    . hydrocortisone (ANUSOL-HC) 2.5 % rectal cream Place 1 application rectally 2 (two) times daily. 30 g 0  . hydroxypropyl methylcellulose (ISOPTO TEARS) 2.5 % ophthalmic solution Place 1 drop into both eyes 2 (two) times daily.     . Multiple Vitamin (MULTIVITAMIN WITH MINERALS) TABS Take 1 tablet by mouth daily.    . nitroGLYCERIN (NITROSTAT) 0.4 MG SL tablet Place 0.4 mg under the tongue every 5 (five) minutes as needed for chest pain.     . ranitidine (ZANTAC) 150 MG tablet Take 1 tablet (150 mg total) by mouth 2 (two) times daily.    . furosemide (LASIX) 20 MG tablet Take 1 tablet (20 mg total) by mouth daily as needed. For swelling 15  tablet 6   No current facility-administered medications for this visit.     Allergies  Allergen Reactions  . Pantoprazole Other (See Comments)    Headache and lightheaded  . Nitrofurantoin Hives  . Sulfa Antibiotics Hives and Itching  . Lidocaine     Mouth swelling     Social History   Socioeconomic History  . Marital status: Married    Spouse name: Not on file  . Number of children: Not on file  . Years of education: Not on file  . Highest education level: Not on file  Social Needs  . Financial resource strain: Not on file  . Food insecurity - worry: Not on file  . Food insecurity - inability: Not on file  . Transportation needs - medical: Not on file  . Transportation needs - non-medical: Not on file  Occupational History  . Not on file  Tobacco Use  . Smoking status: Former Smoker    Years: 40.00    Types: Cigars    Last attempt to quit: 12/24/1983    Years since quitting: 33.3  . Smokeless tobacco: Former Systems developer    Types: Chew    Quit date: 08/28/2013  . Tobacco comment: smoked a pipe  Substance and Sexual Activity  . Alcohol use: No  . Drug use: No  . Sexual activity: Not on file  Other Topics Concern  . Not on file  Social  History Narrative   Was a Clinical cytogeneticist   Was in the [employer] x 4 years   Father died and felt he needed to come home to help his mother    Was asked to rejoin      2 children; boy and girl   3 grand children   One grandson in [place], one grandson in [place];    Intercourse a job as Nurse, adult dtr; [place].     Family History  Problem Relation Age of Onset  . Heart disease Mother   . Diabetes Father   . Heart disease Brother     Review of Systems:  As stated in the HPI and otherwise negative.   BP 118/70   Pulse (!) 47   Ht 5\' 5"  (1.651 m)   Wt 157 lb 1.9 oz (71.3 kg)   SpO2 97%   BMI 26.15 kg/m   Physical Examination:  General: Well developed, well nourished, NAD  HEENT: OP clear, mucus membranes moist  SKIN: warm, dry. No rashes. Neuro: No focal deficits  Musculoskeletal: Muscle strength 5/5 all ext  Psychiatric: Mood and affect normal  Neck: No JVD, no carotid bruits, no thyromegaly, no lymphadenopathy.  Lungs:Clear bilaterally, no wheezes, rhonci, crackles Cardiovascular: Regular rate and rhythm. No murmurs, gallops or rubs. Abdomen:Soft. Bowel sounds present. Non-tender.  Extremities: No lower extremity edema. Pulses are 2 + in the bilateral DP/PT.  Cardiac cath 08/28/13: The left main coronary artery is is severely involved distally with a string-like obstruction of at least 95%. This vessel is heavily calcified..  The left anterior descending artery is totally occluded..  The left circumflex artery is totally occluded.  The right coronary artery is totally occluded proximally. Right to right homocollaterals and noted to feel a right ventricular branch.Marland Kitchen  BYPASS GRAFT ANGIOGRAPHY: The saphenous vein graft to the obtuse marginal is widely patent there is proximal segmental 30-50% narrowing. The obtuse marginal territory supplies collaterals to the distal right coronary.  The saphenous vein graft to the right coronary is totally  occluded  The  saphenous vein graft to the ramus/diagonal is totally occluded  The left internal mammary graft to the LAD and diagonal is widely patent  LEFT VENTRICULOGRAM: Left ventricular angiogram was done in the 30 RAO projection and revealed a well formed inferoposterior aneurysm. The LV cavity is dilated. A focal portion of the mid anterior wall is akinetic. Overall LV function reveals an EF of 35-40%. No obvious mitral regurgitation is noted although power injection was not performed and mild regurgitation could be missed.  IMPRESSIONS: 1. Widely patent sequential left internal graft to the diagonal/LAD.  2. Widely patent saphenous vein graft to the obtuse marginal with proximal 50% narrowing noted in the graft.  3. Total occlusion of the saphenous vein grafts to the right coronary, the ramus intermedius, and the diagonal.  4. Left ventricular dysfunction with inferobasal aneurysm and mid anterior wall region of akinesis. Overall EF is 35-40% .  5. Total occlusion of the native circulation.  Echo 01/15/16: Procedure narrative: Transthoracic echocardiography. Image   quality was poor. The study was technically difficult, as a   result of poor patient compliance and body habitus. Intravenous   contrast (Definity) was administered. - Left ventricle: The cavity size was normal. Wall thickness was   increased in a pattern of severe LVH. Systolic function was   mildly to moderately reduced. The estimated ejection fraction was   in the range of 40% to 45%. Anterior and apical hypokinesis, as   well as basal to mid inferior wall hypokinesis. Diastolic   dysfunciton with indeterminate LV filling pressure. The study is   not technically sufficient to allow evaluation of LV diastolic   function. - Aorta: Aortic root dimension: 41 mm (ED). - Aortic root: The aortic root is dilated. - Mitral valve: Mildly thickened leaflets . There was mild   regurgitation. - Left atrium: The atrium was mildly dilated. -  Tricuspid valve: There was mild regurgitation. - Pulmonary arteries: PA peak pressure: 33 mm Hg (S). - Inferior vena cava: The vessel was dilated. The respirophasic   diameter changes were blunted (< 50%), consistent with elevated   central venous pressure.  Impressions:  - Compared to a prior report in 2015, the LVEF is lower at 40-45%   with anterior, apical and basal to mid inferior wall hypokinesis,   the ascending aorta is dilated to 4.1 cm.  EKG:  EKG is not ordered today. The ekg ordered today demonstrates   Recent Labs: 02/16/2017: B Natriuretic Peptide 351.6 02/19/2017: ALT 48; BUN 24; Creatinine, Ser 0.98; Hemoglobin 13.0; Platelets 130; Potassium 4.1; Sodium 140   Lipid Panel    Component Value Date/Time   CHOL 133 07/23/2015 0755   TRIG 49 07/23/2015 0755   HDL 52 07/23/2015 0755   CHOLHDL 2.6 07/23/2015 0755   VLDL 10 07/23/2015 0755   LDLCALC 71 07/23/2015 0755     Wt Readings from Last 3 Encounters:  05/08/17 157 lb 1.9 oz (71.3 kg)  03/17/17 160 lb 6.4 oz (72.8 kg)  03/01/17 163 lb 6.4 oz (74.1 kg)     Other studies Reviewed: Additional studies/ records that were reviewed today include: . Review of the above records demonstrates:    Assessment and Plan:   1. CAD s/p CABG with  angina: he has stable chest pains with moderate exertion, unchanged. CAD stable by cath in 2015. Will continue statin. He has not tolerated beta blockers due to bradycardia. His Imdur has been stopped due to hypotension. No ASA  since he has been started on Eliquis.    2. Hyperlipidemia: Will check Lipids now. Continue statin.   3. HTN: BP is well controlled. No changes today  4. Ischemic cardiomyopathy: His last LVEF was 40-45% by Echo in October 2017. He does not tolerate Ace-inh or beta blockers. His volume status is ok today. No changes.   5. Atrial fibrillation: He appears to be in sinus today. Continue Eliquis.  I think his stroke risk is high and Eliquis is indicated.  He has some oozing around his suprapubic catheter. If bleeding worsens, will have to consider stopping Eliquis. He is seeing urology this week.   Current medicines are reviewed at length with the patient today.  The patient does not have concerns regarding medicines.  The following changes have been made:  no change  Labs/ tests ordered today include:   Orders Placed This Encounter  Procedures  . Lipid Profile  . Hepatic function panel    Disposition:   FU with me in 6 months  Signed, Lauree Chandler, MD 05/08/2017 10:55 AM    [facility] [address], [place], [place]  [address] Phone: [phone]; Fax: [phone]

## 2017-05-09 ENCOUNTER — Telehealth: Payer: Self-pay | Admitting: Cardiovascular Disease

## 2017-05-09 NOTE — Telephone Encounter (Signed)
Patient wife called. They need a Script for Eliquis sent to [facility]. They are also requesting something signed on letterhead stating why patient is taking medication,who prescribed it, and Why. Please fax to [facility] Dr. Romero Liner @ [phone]. Please call wife back with questions.

## 2017-05-09 NOTE — Telephone Encounter (Signed)
Patient wife [name]) calling states that patient needs a copy of [facility]Dr. Romero Liner at [phone].

## 2017-05-09 NOTE — Telephone Encounter (Signed)
VM left for patient. Need to get a signed ROI before records can be faxed to [place]. Will wait for pt to call back to discuss.

## 2017-05-09 NOTE — Telephone Encounter (Signed)
Patient wife [name] calling, states that patient needs a prescription for Eliquis 5 mg faxed to Dr. Romero Liner at the [place].  Patient wife asks that prescription contain: patient full name, DOB and last of SSN,

## 2017-05-10 ENCOUNTER — Other Ambulatory Visit: Payer: Medicare Other | Admitting: *Deleted

## 2017-05-10 DIAGNOSIS — E7800 Pure hypercholesterolemia, unspecified: Secondary | ICD-10-CM | POA: Diagnosis not present

## 2017-05-10 LAB — LIPID PANEL
Chol/HDL Ratio: 2.9 ratio (ref 0.0–5.0)
Cholesterol, Total: 134 mg/dL (ref 100–199)
HDL: 47 mg/dL (ref >39)
LDL Calculated: 76 mg/dL (ref 0–99)
Triglycerides: 57 mg/dL (ref 0–149)
VLDL Cholesterol Cal: 11 mg/dL (ref 5–40)

## 2017-05-10 LAB — HEPATIC FUNCTION PANEL
ALT: 19 IU/L (ref 0–44)
AST: 23 IU/L (ref 0–40)
Albumin: 4.3 g/dL (ref 3.5–4.7)
Alkaline Phosphatase: 85 IU/L (ref 39–117)
Bilirubin Total: 0.8 mg/dL (ref 0.0–1.2)
Bilirubin, Direct: 0.21 mg/dL (ref 0.00–0.40)
Total Protein: 6.5 g/dL (ref 6.0–8.5)

## 2017-05-10 MED ORDER — APIXABAN 5 MG PO TABS: 5.0000 mg | ORAL_TABLET | Freq: Two times a day (BID) | ORAL | 11 refills | Status: DC

## 2017-05-10 NOTE — Telephone Encounter (Signed)
Prescription has been faxed.

## 2017-05-10 NOTE — Telephone Encounter (Signed)
I spoke with pt's wife and told her I would send prescription and last office note to [place].  Paperwork faxed to number below.

## 2017-05-11 ENCOUNTER — Emergency Department (HOSPITAL_COMMUNITY): Payer: Medicare Other

## 2017-05-11 ENCOUNTER — Inpatient Hospital Stay (HOSPITAL_COMMUNITY)
Admission: EM | Admit: 2017-05-11 | Discharge: 2017-05-15 | DRG: 699 | Disposition: A | Discharge status: Home / Self Care | Payer: Medicare Other | Attending: Family Medicine | Admitting: Family Medicine

## 2017-05-11 ENCOUNTER — Encounter (HOSPITAL_COMMUNITY): Payer: Self-pay

## 2017-05-11 DIAGNOSIS — E876 Hypokalemia: Secondary | ICD-10-CM | POA: Diagnosis not present

## 2017-05-11 DIAGNOSIS — J111 Influenza due to unidentified influenza virus with other respiratory manifestations: Secondary | ICD-10-CM | POA: Diagnosis not present

## 2017-05-11 DIAGNOSIS — I10 Essential (primary) hypertension: Secondary | ICD-10-CM

## 2017-05-11 DIAGNOSIS — K219 Gastro-esophageal reflux disease without esophagitis: Secondary | ICD-10-CM | POA: Diagnosis present

## 2017-05-11 DIAGNOSIS — I252 Old myocardial infarction: Secondary | ICD-10-CM

## 2017-05-11 DIAGNOSIS — Z9842 Cataract extraction status, left eye: Secondary | ICD-10-CM

## 2017-05-11 DIAGNOSIS — R0682 Tachypnea, not elsewhere classified: Secondary | ICD-10-CM | POA: Diagnosis present

## 2017-05-11 DIAGNOSIS — I480 Paroxysmal atrial fibrillation: Secondary | ICD-10-CM | POA: Diagnosis present

## 2017-05-11 DIAGNOSIS — T83510D Infection and inflammatory reaction due to cystostomy catheter, subsequent encounter: Secondary | ICD-10-CM | POA: Diagnosis not present

## 2017-05-11 DIAGNOSIS — I110 Hypertensive heart disease with heart failure: Secondary | ICD-10-CM | POA: Diagnosis present

## 2017-05-11 DIAGNOSIS — Z9359 Other cystostomy status: Secondary | ICD-10-CM | POA: Diagnosis not present

## 2017-05-11 DIAGNOSIS — D696 Thrombocytopenia, unspecified: Secondary | ICD-10-CM | POA: Diagnosis present

## 2017-05-11 DIAGNOSIS — R509 Fever, unspecified: Secondary | ICD-10-CM | POA: Insufficient documentation

## 2017-05-11 DIAGNOSIS — Z7901 Long term (current) use of anticoagulants: Secondary | ICD-10-CM

## 2017-05-11 DIAGNOSIS — E869 Volume depletion, unspecified: Secondary | ICD-10-CM | POA: Diagnosis present

## 2017-05-11 DIAGNOSIS — R739 Hyperglycemia, unspecified: Secondary | ICD-10-CM | POA: Diagnosis present

## 2017-05-11 DIAGNOSIS — Z8249 Family history of ischemic heart disease and other diseases of the circulatory system: Secondary | ICD-10-CM

## 2017-05-11 DIAGNOSIS — Z951 Presence of aortocoronary bypass graft: Secondary | ICD-10-CM | POA: Diagnosis not present

## 2017-05-11 DIAGNOSIS — F0390 Unspecified dementia without behavioral disturbance: Secondary | ICD-10-CM | POA: Diagnosis present

## 2017-05-11 DIAGNOSIS — J101 Influenza due to other identified influenza virus with other respiratory manifestations: Secondary | ICD-10-CM | POA: Diagnosis not present

## 2017-05-11 DIAGNOSIS — I255 Ischemic cardiomyopathy: Secondary | ICD-10-CM | POA: Diagnosis present

## 2017-05-11 DIAGNOSIS — Z87891 Personal history of nicotine dependence: Secondary | ICD-10-CM

## 2017-05-11 DIAGNOSIS — R05 Cough: Secondary | ICD-10-CM | POA: Diagnosis not present

## 2017-05-11 DIAGNOSIS — N35812 Other urethral bulbous stricture, male: Secondary | ICD-10-CM | POA: Diagnosis not present

## 2017-05-11 DIAGNOSIS — I25810 Atherosclerosis of coronary artery bypass graft(s) without angina pectoris: Secondary | ICD-10-CM | POA: Diagnosis not present

## 2017-05-11 DIAGNOSIS — Z888 Allergy status to other drugs, medicaments and biological substances status: Secondary | ICD-10-CM

## 2017-05-11 DIAGNOSIS — Z85820 Personal history of malignant melanoma of skin: Secondary | ICD-10-CM | POA: Diagnosis not present

## 2017-05-11 DIAGNOSIS — Z8546 Personal history of malignant neoplasm of prostate: Secondary | ICD-10-CM | POA: Diagnosis not present

## 2017-05-11 DIAGNOSIS — Z87440 Personal history of urinary (tract) infections: Secondary | ICD-10-CM

## 2017-05-11 DIAGNOSIS — Z87442 Personal history of urinary calculi: Secondary | ICD-10-CM | POA: Diagnosis not present

## 2017-05-11 DIAGNOSIS — Z833 Family history of diabetes mellitus: Secondary | ICD-10-CM

## 2017-05-11 DIAGNOSIS — N390 Urinary tract infection, site not specified: Secondary | ICD-10-CM | POA: Diagnosis present

## 2017-05-11 DIAGNOSIS — T83511A Infection and inflammatory reaction due to indwelling urethral catheter, initial encounter: Secondary | ICD-10-CM

## 2017-05-11 DIAGNOSIS — Z9841 Cataract extraction status, right eye: Secondary | ICD-10-CM

## 2017-05-11 DIAGNOSIS — Z961 Presence of intraocular lens: Secondary | ICD-10-CM | POA: Diagnosis present

## 2017-05-11 DIAGNOSIS — E785 Hyperlipidemia, unspecified: Secondary | ICD-10-CM | POA: Diagnosis present

## 2017-05-11 DIAGNOSIS — Y846 Urinary catheterization as the cause of abnormal reaction of the patient, or of later complication, without mention of misadventure at the time of the procedure: Secondary | ICD-10-CM | POA: Diagnosis present

## 2017-05-11 DIAGNOSIS — I440 Atrioventricular block, first degree: Secondary | ICD-10-CM | POA: Diagnosis present

## 2017-05-11 DIAGNOSIS — A419 Sepsis, unspecified organism: Secondary | ICD-10-CM

## 2017-05-11 DIAGNOSIS — I5022 Chronic systolic (congestive) heart failure: Secondary | ICD-10-CM | POA: Diagnosis present

## 2017-05-11 DIAGNOSIS — R0602 Shortness of breath: Secondary | ICD-10-CM | POA: Diagnosis not present

## 2017-05-11 DIAGNOSIS — T83510A Infection and inflammatory reaction due to cystostomy catheter, initial encounter: Principal | ICD-10-CM | POA: Diagnosis present

## 2017-05-11 DIAGNOSIS — R531 Weakness: Secondary | ICD-10-CM | POA: Diagnosis not present

## 2017-05-11 DIAGNOSIS — Z882 Allergy status to sulfonamides status: Secondary | ICD-10-CM

## 2017-05-11 DIAGNOSIS — N179 Acute kidney failure, unspecified: Secondary | ICD-10-CM | POA: Diagnosis present

## 2017-05-11 DIAGNOSIS — J9811 Atelectasis: Secondary | ICD-10-CM | POA: Diagnosis present

## 2017-05-11 DIAGNOSIS — Z79899 Other long term (current) drug therapy: Secondary | ICD-10-CM

## 2017-05-11 LAB — CBC WITH DIFFERENTIAL/PLATELET
Basophils Absolute: 0 10*3/uL (ref 0.0–0.1)
Basophils Relative: 0 %
Eosinophils Absolute: 0.1 10*3/uL (ref 0.0–0.7)
Eosinophils Relative: 1 %
HCT: 42.1 % (ref 39.0–52.0)
Hemoglobin: 13.8 g/dL (ref 13.0–17.0)
Lymphocytes Relative: 21 %
Lymphs Abs: 1.1 10*3/uL (ref 0.7–4.0)
MCH: 30.9 pg (ref 26.0–34.0)
MCHC: 32.8 g/dL (ref 30.0–36.0)
MCV: 94.2 fL (ref 78.0–100.0)
Monocytes Absolute: 0.6 10*3/uL (ref 0.1–1.0)
Monocytes Relative: 11 %
Neutro Abs: 3.5 10*3/uL (ref 1.7–7.7)
Neutrophils Relative %: 67 %
Platelets: 116 10*3/uL — ABNORMAL LOW (ref 150–400)
RBC: 4.47 MIL/uL (ref 4.22–5.81)
RDW: 14.2 % (ref 11.5–15.5)
WBC: 5.2 10*3/uL (ref 4.0–10.5)

## 2017-05-11 LAB — URINALYSIS, ROUTINE W REFLEX MICROSCOPIC
Bilirubin Urine: NEGATIVE
Glucose, UA: NEGATIVE mg/dL
Ketones, ur: NEGATIVE mg/dL
Nitrite: NEGATIVE
Protein, ur: 100 mg/dL — AB
Specific Gravity, Urine: 1.017 (ref 1.005–1.030)
Squamous Epithelial / LPF: NONE SEEN
pH: 6 (ref 5.0–8.0)

## 2017-05-11 LAB — COMPREHENSIVE METABOLIC PANEL
ALT: 20 U/L (ref 17–63)
AST: 30 U/L (ref 15–41)
Albumin: 3.8 g/dL (ref 3.5–5.0)
Alkaline Phosphatase: 71 U/L (ref 38–126)
Anion gap: 11 (ref 5–15)
BUN: 17 mg/dL (ref 6–20)
CO2: 24 mmol/L (ref 22–32)
Calcium: 9 mg/dL (ref 8.9–10.3)
Chloride: 103 mmol/L (ref 101–111)
Creatinine, Ser: 1.25 mg/dL — ABNORMAL HIGH (ref 0.61–1.24)
GFR calc Af Amer: 58 mL/min — ABNORMAL LOW (ref >60)
GFR calc non Af Amer: 50 mL/min — ABNORMAL LOW (ref >60)
Glucose, Bld: 179 mg/dL — ABNORMAL HIGH (ref 65–99)
Potassium: 3.5 mmol/L (ref 3.5–5.1)
Sodium: 138 mmol/L (ref 135–145)
Total Bilirubin: 0.7 mg/dL (ref 0.3–1.2)
Total Protein: 6.5 g/dL (ref 6.5–8.1)

## 2017-05-11 LAB — I-STAT CG4 LACTIC ACID, ED
Lactic Acid, Venous: 1.54 mmol/L (ref 0.5–1.9)
Lactic Acid, Venous: 1.21 mmol/L (ref 0.5–1.9)

## 2017-05-11 LAB — TROPONIN I: Troponin I: <0.03 ng/mL (ref <0.03)

## 2017-05-11 MED ORDER — ACETAMINOPHEN 325 MG PO TABS
650.0000 mg | ORAL_TABLET | Freq: Four times a day (QID) | ORAL | Status: DC | PRN
  Administered 2017-05-12: 650 mg via ORAL
  Filled 2017-05-11: qty 2

## 2017-05-11 MED ORDER — GUAIFENESIN ER 600 MG PO TB12
600.0000 mg | ORAL_TABLET | Freq: Two times a day (BID) | ORAL | Status: DC
  Administered 2017-05-11 – 2017-05-15 (×8): 600 mg via ORAL
  Filled 2017-05-11 (×8): qty 1

## 2017-05-11 MED ORDER — FAMOTIDINE 20 MG PO TABS
20.0000 mg | ORAL_TABLET | Freq: Two times a day (BID) | ORAL | Status: DC
  Administered 2017-05-11 – 2017-05-15 (×8): 20 mg via ORAL
  Filled 2017-05-11 (×8): qty 1

## 2017-05-11 MED ORDER — SODIUM CHLORIDE 0.9 % IV SOLN
500.0000 mg | INTRAVENOUS | Status: DC
  Administered 2017-05-11: 500 mg via INTRAVENOUS
  Filled 2017-05-11 (×2): qty 500

## 2017-05-11 MED ORDER — CALCIUM CARBONATE ANTACID 500 MG PO CHEW
1.0000 | CHEWABLE_TABLET | Freq: Two times a day (BID) | ORAL | Status: DC | PRN
  Filled 2017-05-11: qty 1

## 2017-05-11 MED ORDER — HYDROCORTISONE 2.5 % RE CREA: 1.0000 "application " | TOPICAL_CREAM | Freq: Two times a day (BID) | RECTAL | Status: DC | PRN

## 2017-05-11 MED ORDER — SODIUM CHLORIDE 0.9 % IV SOLN
1.0000 g | INTRAVENOUS | Status: DC
  Filled 2017-05-11: qty 10

## 2017-05-11 MED ORDER — DIPHENHYDRAMINE HCL 50 MG/ML IJ SOLN: 12.5000 mg | Freq: Four times a day (QID) | INTRAMUSCULAR | Status: DC | PRN

## 2017-05-11 MED ORDER — ACETAMINOPHEN 650 MG RE SUPP: 650.0000 mg | Freq: Four times a day (QID) | RECTAL | Status: DC | PRN

## 2017-05-11 MED ORDER — SODIUM CHLORIDE 0.9 % IV SOLN
INTRAVENOUS | Status: DC
  Administered 2017-05-11 – 2017-05-12 (×2): via INTRAVENOUS

## 2017-05-11 MED ORDER — HYPROMELLOSE (GONIOSCOPIC) 2.5 % OP SOLN
1.0000 [drp] | Freq: Two times a day (BID) | OPHTHALMIC | Status: DC
  Administered 2017-05-12 – 2017-05-15 (×5): 1 [drp] via OPHTHALMIC
  Filled 2017-05-11: qty 15

## 2017-05-11 MED ORDER — SODIUM CHLORIDE 0.9 % IV SOLN
1.0000 g | Freq: Once | INTRAVENOUS | Status: AC
  Administered 2017-05-11: 1 g via INTRAVENOUS
  Filled 2017-05-11: qty 10

## 2017-05-11 MED ORDER — GALANTAMINE HYDROBROMIDE 4 MG PO TABS
4.0000 mg | ORAL_TABLET | Freq: Two times a day (BID) | ORAL | Status: DC
  Administered 2017-05-12 – 2017-05-15 (×7): 4 mg via ORAL
  Filled 2017-05-11 (×8): qty 1

## 2017-05-11 MED ORDER — ATORVASTATIN CALCIUM 80 MG PO TABS
80.0000 mg | ORAL_TABLET | Freq: Every evening | ORAL | Status: DC
  Administered 2017-05-11 – 2017-05-14 (×4): 80 mg via ORAL
  Filled 2017-05-11 (×4): qty 1

## 2017-05-11 MED ORDER — ONDANSETRON HCL 4 MG PO TABS: 4.0000 mg | ORAL_TABLET | Freq: Four times a day (QID) | ORAL | Status: DC | PRN

## 2017-05-11 MED ORDER — DIPHENHYDRAMINE HCL 50 MG/ML IJ SOLN
12.5000 mg | INTRAMUSCULAR | Status: AC
  Administered 2017-05-11: 12.5 mg via INTRAVENOUS
  Filled 2017-05-11: qty 1

## 2017-05-11 MED ORDER — SODIUM CHLORIDE 0.9 % IV BOLUS (SEPSIS)
1000.0000 mL | Freq: Once | INTRAVENOUS | Status: AC
  Administered 2017-05-11: 1000 mL via INTRAVENOUS

## 2017-05-11 MED ORDER — ACETAMINOPHEN 325 MG PO TABS
650.0000 mg | ORAL_TABLET | Freq: Once | ORAL | Status: AC
  Administered 2017-05-11: 650 mg via ORAL
  Filled 2017-05-11: qty 2

## 2017-05-11 MED ORDER — IPRATROPIUM-ALBUTEROL 0.5-2.5 (3) MG/3ML IN SOLN: 3.0000 mL | Freq: Four times a day (QID) | RESPIRATORY_TRACT | Status: DC | PRN

## 2017-05-11 MED ORDER — ONDANSETRON HCL 4 MG/2ML IJ SOLN: 4.0000 mg | Freq: Four times a day (QID) | INTRAMUSCULAR | Status: DC | PRN

## 2017-05-11 MED ORDER — APIXABAN 5 MG PO TABS
5.0000 mg | ORAL_TABLET | Freq: Two times a day (BID) | ORAL | Status: DC
  Administered 2017-05-11 – 2017-05-15 (×8): 5 mg via ORAL
  Filled 2017-05-11 (×8): qty 1

## 2017-05-11 NOTE — H&P (Signed)
History and Physical    ARAGON SCARANTINO IOM:[id number] DOB: May 18, 1927 DOA: 05/11/2017  Referring MD/NP/PA: Dr. Shirlyn Goltz PCP: Marletta Lor, MD  Patient coming from: home via [facility]  Chief Complaint: Weakness  I have personally briefly reviewed patient's old medical records in [place]   HPI: Ronald [name] is a 84 y.o. male with medical history significant of HTN, CAD s/p CABG/stents, prostate cancer with chronic suprapubic catheter, and Bell's palsy; who presented with complaints of weakness.  Since yesterday patient reported having a mild cough with intermittent sputum production.  Wife checked temperature at home and it was noted noted to be 99.4 F.  He had gone to [facility] earlier today and had his suprapubic catheter changed out as he does every 4 weeks.  Thereafter patient complained of some mild abdominal discomfort that is not unusual for him and had gone to take a nap.  However, when he woke up patient was significantly weak and was unable able to get up or ambulate.  He has had some bleeding from around his suprapubic catheter, but unchanged from baseline since being placed on Eliquis.  ED Course: Upon admission into the emergency department patient was noted to be afebrile up to 101.6 F, pulse 67-74, respirations 17-24, blood pressures maintained, and O2 saturations 93-96% on room air.  Labs revealed WBC 5.2, hemoglobin 13.8, platelets 116, BUN 17, creatinine 1.25, glucose 179, lactic acid 1.21, and all other labs of CMP within normal limits.  Urinalysis was positive for moderate leukocytes, rare bacteria too numerous to count RBCs, no squamous cells, and too numerous to count WBCs.  Chest x-ray showe bibasilar subsegmental atelectasis.  Patient was initially started on Rocephin for concern of urinary source for infection and fever.  Review of Systems  Constitutional: Positive for fever and malaise/fatigue.  HENT: Negative for congestion and ear discharge.     Eyes: Negative for photophobia and pain.  Respiratory: Positive for cough.   Cardiovascular: Negative for chest pain and leg swelling.  Gastrointestinal: Positive for abdominal pain. Negative for nausea and vomiting.  Genitourinary: Positive for hematuria. Negative for dysuria and flank pain.  Musculoskeletal: Negative for falls.  Neurological: Positive for weakness. Negative for focal weakness and loss of consciousness.  Psychiatric/Behavioral: Negative for hallucinations and suicidal ideas.    Past Medical History:  Diagnosis Date  . Arthritis    "joints ache" (01/02/2014)  . At risk for sleep apnea    STOP-BANG= 4    SENT TO PCP 12-23-2013  . Bladder calculi   . CHF (congestive heart failure) ([place])   . Chronic cystitis   . Coronary artery disease CARDIOLOGIST-  DR Grace Bushy  MI -- S/P  11/89 CABG x6 (70% circ; 90% PD; 60-70% distal left main; 30% left circ; 90% 1st diag;, 2nd diag and 3rd diag. w/90%,  mild stenosis LAD and 60-70% pLAD)  Re-do CABG x5 in 1997  . Diverticulosis   . GERD (gastroesophageal reflux disease)   . History of Bell's palsy    RIGHT SIDE-- NO RESIDUAL  . Hx of dizziness   . Hyperlipidemia   . Hypertension    "not anymore" (01/02/2014)  . Ischemic cardiomyopathy    ef 35-40% per cath 08-28-2013  . Kidney stones "years ago"   "passed them"  . Melanoma of ear ([place])    "right"  . Mild dementia   . Myocardial infarction ([place]) 1986; 1997  . Nocturia   . OAB (overactive bladder)   .  Prostate cancer ([place]) 1998   S/P  [place]; [place]  . S/P CABG (coronary artery bypass graft)    [place]  . Skipped heart beats    occasional  . Urethral stricture   . UTI (urinary tract infection) 09/2015  . Wears hearing aid    bilateral  . Wears partial dentures     Past Surgical History:  Procedure Laterality Date  . CARDIAC CATHETERIZATION  02-22-2005  dr Vidal Schwalbe   mild to moderate lv dysfunction with  inferobasilar akinesis/  totally occluded SVG to Intermediate Diagonal and SVG to PDA and RCA branches, totally occluded native coronary circulation with diffuse disease pLAD diagonal system with potentially could be ischemic/  patent SVG to OM with collaterals to dRCA and patent LIMA to LAD and diagonal systemss  . CARDIAC CATHETERIZATION  08-28-2013  DR Daneen Schick   widely patent sequential left internal graft to the diagonal/LAD, widely patent SVG to OM with proximal 50% narrowing noted in the graft, total occlusion SVG's to RCA, RI, and the Diagonal/ LV dysfunction with inferobasal aneurysm and mid anterior wall region of akinesis/  overall EF 35-40%/  Total occlusion of the navtive circulation/  no significant change compared to 2006 cath  . CARDIOVASCULAR STRESS TEST  08-01-2011  dr Angelena Form   inferior scar and possible soft tissue attenuation with minimal peri-infarct ischemia, small region of anterior ischemia and scar/  LVEF 53% LV wall motion with inferior hypokinesis/ no significant change from scan july 2011  . CATARACT EXTRACTION W/ INTRAOCULAR LENS  IMPLANT, BILATERAL Bilateral 2007  . CORONARY ARTERY BYPASS GRAFT  11/ [place]-- 6 vessel/  1997 Re-do 5 vessel  . CYSTOSCOPY WITH URETHRAL DILATATION N/A 12/25/2013   Procedure: CYSTOSCOPY WITH URETHRAL DILATATION, WITH BIOPSY;  Surgeon: Bernestine Amass, MD;  Location: [facility];  Service: Urology;  Laterality: N/A;  . CYSTOSCOPY WITH URETHRAL DILATATION N/A 12/22/2014   Procedure: CYSTOSCOPY WITH URETHRAL DILATATION;  Surgeon: Rana Snare, MD;  Location: [facility];  Service: Urology;  Laterality: N/A;  BALLOON DILATION CATHETER    . EUS  10/05/2011   Procedure: ESOPHAGEAL ENDOSCOPIC ULTRASOUND (EUS) RADIAL;  Surgeon: Arta Silence, MD;  Location: [facility];  Service: Endoscopy;  Laterality: N/A;  . INSERTION OF SUPRAPUBIC CATHETER N/A 12/22/2014   Procedure: INSERTION OF SUPRAPUBIC CATHETER ;  Surgeon:  Rana Snare, MD;  Location: [facility];  Service: Urology;  Laterality: N/A;  . LAPAROSCOPIC CHOLECYSTECTOMY  12-23-2005  . LEFT HEART CATHETERIZATION WITH CORONARY/GRAFT ANGIOGRAM N/A 08/28/2013   Procedure: LEFT HEART CATHETERIZATION WITH Beatrix Fetters;  Surgeon: Sinclair Grooms, MD;  Location: [facility] CATH LAB;  Service: Cardiovascular;  Laterality: N/A;  . MELANOMA EXCISION  X 1   "ear"  . RADIOACTIVE PROSTATE SEED IMPLANTS  1998  . SKIN CANCER EXCISION  X 2   "top of head"  . [place]     reports that he quit smoking about 33 years ago. His smoking use included cigars. He quit after 40.00 years of use. He quit smokeless tobacco use about 3 years ago. His smokeless tobacco use included chew. He reports that he does not drink alcohol or use drugs.  Allergies  Allergen Reactions  . Pantoprazole Other (See Comments)    Headache and lightheaded  . Nitrofurantoin Hives  . Sulfa Antibiotics Hives and Itching  . Lidocaine  Mouth swelling     Family History  Problem Relation Age of Onset  . Heart disease Mother   . Diabetes Father   . Heart disease Brother     Prior to Admission medications   Medication Sig Start Date End Date Taking? Authorizing Provider  acetaminophen (TYLENOL) 500 MG tablet Take 1,000 mg by mouth every 6 (six) hours as needed for pain or fever.     [provider]  apixaban (ELIQUIS) 5 MG TABS tablet Take 1 tablet (5 mg total) by mouth 2 (two) times daily. 05/10/17   Burnell Blanks, MD  atorvastatin (LIPITOR) 80 MG tablet Take 1 tablet (80 mg total) by mouth every evening. 01/21/16   Orson Eva, MD  Calcium Carbonate Antacid (TUMS PO) Take 3 tablets by mouth daily as needed (gas).    [provider]  furosemide (LASIX) 20 MG tablet Take 1 tablet (20 mg total) by mouth daily as needed. For swelling 05/08/17 08/06/17  Burnell Blanks, MD  galantamine (RAZADYNE ER) 8 MG 24 hr capsule Take 8 mg by  mouth daily with breakfast.    [provider]  hydrocortisone (ANUSOL-HC) 2.5 % rectal cream Place 1 application rectally 2 (two) times daily. 03/01/17   Marletta Lor, MD  hydroxypropyl methylcellulose (ISOPTO TEARS) 2.5 % ophthalmic solution Place 1 drop into both eyes 2 (two) times daily.     [provider]  Multiple Vitamin (MULTIVITAMIN WITH MINERALS) TABS Take 1 tablet by mouth daily.    [provider]  nitroGLYCERIN (NITROSTAT) 0.4 MG SL tablet Place 0.4 mg under the tongue every 5 (five) minutes as needed for chest pain.     [provider]  ranitidine (ZANTAC) 150 MG tablet Take 1 tablet (150 mg total) by mouth 2 (two) times daily. 05/18/16   Burnell Blanks, MD    Physical Exam:  Constitutional: Elderly male who appears ill but Vitals:   05/11/17 1900 05/11/17 1921 05/11/17 1930 05/11/17 1945  BP:   125/65 129/60  Pulse: 67  68 71  Resp: (!) 24  17 (!) 23  Temp:  (!) 101.6 F (38.7 C)    TempSrc:  Rectal    SpO2: 96%  94% 92%  Height:       Eyes: PERRL, lids and conjunctivae normal ENMT: Mucous membranes are dry. Posterior pharynx clear of any exudate or lesions.male who appears to be Neck: normal, supple, no masses, no thyromegaly Respiratory: clear to auscultation bilaterally, no wheezing, no crackles. Normal respiratory effort. No accessory muscle use.  Cardiovascular: Regular rate and rhythm, no murmurs / rubs / gallops. No extremity edema. 2+ pedal pulses. No carotid bruits.  Abdomen: no tenderness, no masses palpated. No hepatosplenomegaly. Bowel sounds positive.  Suprapubic catheter in place with some blood present surrounding tube, but no significant erythema. Musculoskeletal: no clubbing / cyanosis. No joint deformity upper and lower extremities. Good ROM, no contractures. Normal muscle tone.  Skin: no rashes, lesions, ulcers. No induration Neurologic: CN 2-12 grossly intact. Sensation intact, DTR normal. Strength  5/5 in all 4.  Psychiatric: Normal judgment and insight. Alert and oriented x 3. Normal mood.     Labs on Admission: I have personally reviewed following labs and imaging studies  CBC: Recent Labs  Lab 05/11/17 1553  WBC 5.2  NEUTROABS 3.5  HGB 13.8  HCT 42.1  MCV 94.2  PLT 081*   Basic Metabolic Panel: Recent Labs  Lab 05/11/17 1553  NA 138  K 3.5  CL 103  CO2 24  GLUCOSE 179*  BUN 17  CREATININE 1.25*  CALCIUM 9.0   GFR: Estimated Creatinine Clearance: 36.2 mL/min (A) (by C-G formula based on SCr of 1.25 mg/dL (H)). Liver Function Tests: Recent Labs  Lab 05/10/17 0741 05/11/17 1553  AST 23 30  ALT 19 20  ALKPHOS 85 71  BILITOT 0.8 0.7  PROT 6.5 6.5  ALBUMIN 4.3 3.8   No results for input(s): LIPASE, AMYLASE in the last 168 hours. No results for input(s): AMMONIA in the last 168 hours. Coagulation Profile: No results for input(s): INR, PROTIME in the last 168 hours. Cardiac Enzymes: No results for input(s): CKTOTAL, CKMB, CKMBINDEX, TROPONINI in the last 168 hours. BNP (last 3 results) No results for input(s): PROBNP in the last 8760 hours. HbA1C: No results for input(s): HGBA1C in the last 72 hours. CBG: No results for input(s): GLUCAP in the last 168 hours. Lipid Profile: Recent Labs    05/10/17 0741  CHOL 134  HDL 47  LDLCALC 76  TRIG 57  CHOLHDL 2.9   Thyroid Function Tests: No results for input(s): TSH, T4TOTAL, FREET4, T3FREE, THYROIDAB in the last 72 hours. Anemia Panel: No results for input(s): VITAMINB12, FOLATE, FERRITIN, TIBC, IRON, RETICCTPCT in the last 72 hours. Urine analysis:    Component Value Date/Time   COLORURINE YELLOW 05/11/2017 1632   APPEARANCEUR CLOUDY (A) 05/11/2017 1632   LABSPEC 1.017 05/11/2017 1632   PHURINE 6.0 05/11/2017 1632   GLUCOSEU NEGATIVE 05/11/2017 1632   HGBUR LARGE (A) 05/11/2017 1632   BILIRUBINUR NEGATIVE 05/11/2017 1632   BILIRUBINUR neg 06/30/2014 1530   KETONESUR NEGATIVE 05/11/2017 1632    PROTEINUR 100 (A) 05/11/2017 1632   UROBILINOGEN 0.2 12/04/2014 2344   NITRITE NEGATIVE 05/11/2017 1632   LEUKOCYTESUR MODERATE (A) 05/11/2017 1632   Sepsis Labs: No results found for this or any previous visit (from the past 240 hour(s)).   Radiological Exams on Admission: Dg Chest 2 View  Result Date: 05/11/2017 CLINICAL DATA:  Productive cough, shortness of breath. EXAM: CHEST  2 VIEW COMPARISON:  Radiograph of February 15, 2017. FINDINGS: Stable cardiomediastinal silhouette. Status post coronary artery bypass graft. No pneumothorax is noted. Stable elevated left hemidiaphragm. No significant pleural effusion is noted. Mild bibasilar subsegmental atelectasis is noted. Bony thorax is unremarkable. IMPRESSION: Mild bibasilar subsegmental atelectasis. Electronically Signed   By: Marijo Conception, M.D.   On: 05/11/2017 16:24    EKG: Independently reviewed.  Sinus rhythm with first-degree AV block similar to previous tracing  Assessment/Plan SIRS 2/2 complicated urinary tract infection with suprapubic catheter vs URI: Acute.  Patient presents febrile up to 101.6 F with some mild tachypnea meeting SIRS criteria.  Urinalysis appears to be a possible source of infection as patient has had this sepsis secondary to UTI previously in the past.  However, also reports cough and chest x-ray shows bibasilar atelectasis.  Influenza screen pending.  Patient was treated empirically with Rocephin. - Admit to a MedSurg bed  - Continuous pulse oximetry with nasal cannula oxygen as needed - Follow-up blood, urine, and sputum cultures - Follow-up influenza screen and add on respiratory virus panel - Continue empiric antibiotics of Rocephin and added on azithromycin in case of possible atypical respiratory infection - Tylenol prn fever - Mucinex - Duonebs as needed for shortness of breath/wheezing  Renal insufficiency: Patient's baseline creatinine appears to be around 1-1.1 but presents with a creatinine  of 1.25 and BUN 17. - IV fluids of normal saline at  75 mL/h overnight - Hold nephrotoxic agents  - Recheck BMP in a.m.  Generalized weakness - Check troponin - Physical therapy to eval and treat  Hyperglycemia: Acute.  Mild elevation in glucose to 179 on admission.  Suspect reactive to acute infection. - Continue to monitor  Essential HTN: Stable - Hold Lasix   CAD s/p CABG with angina: Chronic.  Last noted catheterization in 2015.  Previously on M Doerr but start due to hypotension and not tolerant of beta-blockers due to bradycardia. - Continue statin   Paroxysmal atrial fibrillation: Stable.  Patient currently in sinus rhythm. chadsvasc risk >2  - Continue Eliquis  Dementia - Continue galantamine  Hyperlipidemia - Continue atorvastatin  Thrombocytopenia: Chronic.  Platelet count 116 which appears near baseline.  Patient reports no complaints of bleeding - Continue to monitor  Jerrye Bushy - Continue pharmacy substitution of Pepcid  DVT prophylaxis: Eliquis Code Status: Full Family Communication: Discussed plan of care with the patient family present at bedside Disposition Plan: tbd Consults called: none Admission status: Inpatient   Norval Morton MD [facility] Pager [phone]   If 7PM-7AM, please contact night-coverage [email or web address] Password [facility]  05/11/2017, 7:59 PM    .[email or web address]

## 2017-05-11 NOTE — ED Notes (Signed)
Called x2 no reply 

## 2017-05-11 NOTE — ED Triage Notes (Signed)
PT arrives from home with complaints of congestion, cough, weakness, and malaise x 2 days. HX of sepsis.

## 2017-05-11 NOTE — ED Provider Notes (Signed)
[facility] EMERGENCY DEPARTMENT Provider Note   CSN: [id number] Arrival date & time: 05/11/17  1515     History   Chief Complaint Chief Complaint  Patient presents with  . Weakness    HPI Ronald [name] is a 84 y.o. male history of hypertension, CABG, ureteral stricture with suprapubic catheter here presenting with fever, chills, cough, weakness.  States that he was at his baseline health until last night and he started coughing.  He had some low-grade temperatures at home.  He went to urology office and had his suprapubic catheter change and began to be more weak afterwards.  Per the wife, he had generalized weakness and had trouble even standing up since this afternoon.  Per the wife, patient has recurrent urinary tract infections from his Foley catheter and was hospitalized several months ago for it.  Patient has been in and out the doctor's office this week and does not remember if he got exposed to any patients with flu.   The history is provided by the patient.    Past Medical History:  Diagnosis Date  . Arthritis    "joints ache" (01/02/2014)  . At risk for sleep apnea    STOP-BANG= 4    SENT TO PCP 12-23-2013  . Bladder calculi   . CHF (congestive heart failure) ([place])   . Chronic cystitis   . Coronary artery disease CARDIOLOGIST-  DR Grace Bushy  MI -- S/P  11/89 CABG x6 (70% circ; 90% PD; 60-70% distal left main; 30% left circ; 90% 1st diag;, 2nd diag and 3rd diag. w/90%,  mild stenosis LAD and 60-70% pLAD)  Re-do CABG x5 in 1997  . Diverticulosis   . GERD (gastroesophageal reflux disease)   . History of Bell's palsy    RIGHT SIDE-- NO RESIDUAL  . Hx of dizziness   . Hyperlipidemia   . Hypertension    "not anymore" (01/02/2014)  . Ischemic cardiomyopathy    ef 35-40% per cath 08-28-2013  . Kidney stones "years ago"   "passed them"  . Melanoma of ear ([place])    "right"  . Mild dementia   . Myocardial infarction ([place]) 1986; 1997  . Nocturia     . OAB (overactive bladder)   . Prostate cancer ([place]) 1998   S/P  [place]; [place]  . S/P CABG (coronary artery bypass graft)    [place]  . Skipped heart beats    occasional  . Urethral stricture   . UTI (urinary tract infection) 09/2015  . Wears hearing aid    bilateral  . Wears partial dentures     Patient Active Problem List   Diagnosis Date Noted  . AKI (acute kidney injury) ([place])   . Stage 2 chronic kidney disease   . Bacteremia due to Pseudomonas 01/21/2016  . Debility 01/21/2016  . History of Bell's palsy   . Gastroesophageal reflux disease without esophagitis   . Coronary artery disease involving coronary bypass graft of native heart without angina pectoris   . History of prostate cancer   . Suprapubic catheter ([place])   . Incontinence of feces   . Renal failure syndrome   . Bacteremia   . NSTEMI (non-ST elevated myocardial infarction) ([place]) 01/16/2016  . Septic shock ([place])   . ARF (acute renal failure) ([place]) 10/13/2015  . Sepsis ([place]) 12/05/2014  . Urinary tract infection associated with cystostomy catheter ([place]) 10/03/2014  .  Acute lower UTI 09/10/2014  . Bacteremia due to Enterococcus 01/22/2014  . Chronic systolic CHF (congestive heart failure) ([place]) 02/23/2014  . Urethral stricture 12/25/2013  . Bladder calculus 12/25/2013  . LV dysfunction 09/23/2013  . Unstable angina ([place]) 08/27/2013  . Bacteremia due to Escherichia coli 05/20/2012  . Urinary tract infection 09/15/2011  . Calculus of kidney 09/12/2011  . HYPERCHOLESTEROLEMIA 10/18/2007  . HTN (hypertension) 10/18/2007  . OSTEOARTHRITIS 02/07/2007  . BELL'S PALSY, RIGHT 10/26/2006  . Coronary atherosclerosis 10/26/2006  . PROSTATE CANCER, HX OF 10/26/2006  . NEPHROLITHIASIS, HX OF 10/26/2006    Past Surgical History:  Procedure Laterality Date  . CARDIAC CATHETERIZATION  02-22-2005  dr Vidal Schwalbe   mild to moderate lv dysfunction with inferobasilar akinesis/   totally occluded SVG to Intermediate Diagonal and SVG to PDA and RCA branches, totally occluded native coronary circulation with diffuse disease pLAD diagonal system with potentially could be ischemic/  patent SVG to OM with collaterals to dRCA and patent LIMA to LAD and diagonal systemss  . CARDIAC CATHETERIZATION  08-28-2013  DR Daneen Schick   widely patent sequential left internal graft to the diagonal/LAD, widely patent SVG to OM with proximal 50% narrowing noted in the graft, total occlusion SVG's to RCA, RI, and the Diagonal/ LV dysfunction with inferobasal aneurysm and mid anterior wall region of akinesis/  overall EF 35-40%/  Total occlusion of the navtive circulation/  no significant change compared to 2006 cath  . CARDIOVASCULAR STRESS TEST  08-01-2011  dr Angelena Form   inferior scar and possible soft tissue attenuation with minimal peri-infarct ischemia, small region of anterior ischemia and scar/  LVEF 53% LV wall motion with inferior hypokinesis/ no significant change from scan july 2011  . CATARACT EXTRACTION W/ INTRAOCULAR LENS  IMPLANT, BILATERAL Bilateral 2007  . CORONARY ARTERY BYPASS GRAFT  11/ [place]-- 6 vessel/  1997 Re-do 5 vessel  . CYSTOSCOPY WITH URETHRAL DILATATION N/A 12/25/2013   Procedure: CYSTOSCOPY WITH URETHRAL DILATATION, WITH BIOPSY;  Surgeon: Bernestine Amass, MD;  Location: [facility];  Service: Urology;  Laterality: N/A;  . CYSTOSCOPY WITH URETHRAL DILATATION N/A 12/22/2014   Procedure: CYSTOSCOPY WITH URETHRAL DILATATION;  Surgeon: Rana Snare, MD;  Location: [facility];  Service: Urology;  Laterality: N/A;  BALLOON DILATION CATHETER    . EUS  10/05/2011   Procedure: ESOPHAGEAL ENDOSCOPIC ULTRASOUND (EUS) RADIAL;  Surgeon: Arta Silence, MD;  Location: [facility];  Service: Endoscopy;  Laterality: N/A;  . INSERTION OF SUPRAPUBIC CATHETER N/A 12/22/2014   Procedure: INSERTION OF SUPRAPUBIC CATHETER ;  Surgeon: Rana Snare, MD;  Location:  [facility];  Service: Urology;  Laterality: N/A;  . LAPAROSCOPIC CHOLECYSTECTOMY  12-23-2005  . LEFT HEART CATHETERIZATION WITH CORONARY/GRAFT ANGIOGRAM N/A 08/28/2013   Procedure: LEFT HEART CATHETERIZATION WITH Beatrix Fetters;  Surgeon: Sinclair Grooms, MD;  Location: [facility] CATH LAB;  Service: Cardiovascular;  Laterality: N/A;  . MELANOMA EXCISION  X 1   "ear"  . RADIOACTIVE PROSTATE SEED IMPLANTS  1998  . SKIN CANCER EXCISION  X 2   "top of head"  . TONSILLECTOMY AND ADENOIDECTOMY  1954       Home Medications    Prior to Admission medications   Medication Sig Start Date End Date Taking? Authorizing Provider  acetaminophen (TYLENOL) 500 MG tablet Take 1,000 mg by mouth every 6 (six) hours as needed for pain or fever.     [provider]  apixaban (  ELIQUIS) 5 MG TABS tablet Take 1 tablet (5 mg total) by mouth 2 (two) times daily. 05/10/17   Burnell Blanks, MD  atorvastatin (LIPITOR) 80 MG tablet Take 1 tablet (80 mg total) by mouth every evening. 01/21/16   Orson Eva, MD  Calcium Carbonate Antacid (TUMS PO) Take 3 tablets by mouth daily as needed (gas).    [provider]  furosemide (LASIX) 20 MG tablet Take 1 tablet (20 mg total) by mouth daily as needed. For swelling 05/08/17 08/06/17  Burnell Blanks, MD  galantamine (RAZADYNE ER) 8 MG 24 hr capsule Take 8 mg by mouth daily with breakfast.    [provider]  hydrocortisone (ANUSOL-HC) 2.5 % rectal cream Place 1 application rectally 2 (two) times daily. 03/01/17   Marletta Lor, MD  hydroxypropyl methylcellulose (ISOPTO TEARS) 2.5 % ophthalmic solution Place 1 drop into both eyes 2 (two) times daily.     [provider]  Multiple Vitamin (MULTIVITAMIN WITH MINERALS) TABS Take 1 tablet by mouth daily.    [provider]  nitroGLYCERIN (NITROSTAT) 0.4 MG SL tablet Place 0.4 mg under the tongue every 5 (five) minutes as needed for chest pain.     [provider]  ranitidine (ZANTAC) 150 MG tablet Take 1 tablet (150 mg total) by mouth 2 (two) times daily. 05/18/16   Burnell Blanks, MD    Family History Family History  Problem Relation Age of Onset  . Heart disease Mother   . Diabetes Father   . Heart disease Brother     Social History Social History   Tobacco Use  . Smoking status: Former Smoker    Years: 40.00    Types: Cigars    Last attempt to quit: 12/24/1983    Years since quitting: 33.4  . Smokeless tobacco: Former Systems developer    Types: Chew    Quit date: 08/28/2013  . Tobacco comment: smoked a pipe  Substance Use Topics  . Alcohol use: No  . Drug use: No     Allergies   Pantoprazole; Nitrofurantoin; Sulfa antibiotics; and Lidocaine   Review of Systems Review of Systems  Neurological: Positive for weakness.  All other systems reviewed and are negative.    Physical Exam Updated Vital Signs BP (!) 147/62 (BP Location: Right Arm)   Pulse 67   Temp (!) 101.6 F (38.7 C) (Rectal)   Resp (!) 24   Ht 5\' 5"  (1.651 m)   SpO2 96%   BMI 26.15 kg/m   Physical Exam  Constitutional: He is oriented to person, place, and time.  Chronically ill, tired, dehydrated   HENT:  Head: Normocephalic.  MM dry   Eyes: Conjunctivae and EOM are normal. Pupils are equal, round, and reactive to light.  Neck: Normal range of motion. Neck supple.  Cardiovascular: Normal rate, regular rhythm and normal heart sounds.  Pulmonary/Chest: Effort normal and breath sounds normal. No stridor. No respiratory distress. He has no wheezes.  Abdominal: Soft. Bowel sounds are normal. He exhibits no distension. There is no tenderness. There is no guarding.  Musculoskeletal: Normal range of motion.  Neurological: He is alert and oriented to person, place, and time.  Strength 3/5 bilaterally, CN 2- 12 intact   Skin: Skin is warm.  Psychiatric: He has a normal mood and affect.  Nursing note and vitals reviewed.    ED Treatments / Results   Labs (all labs ordered are listed, but only abnormal results are displayed) Labs Reviewed  COMPREHENSIVE METABOLIC PANEL - Abnormal; Notable for the following components:      Result Value   Glucose, Bld 179 (*)    Creatinine, Ser 1.25 (*)    GFR calc non Af Amer 50 (*)    GFR calc Af Amer 58 (*)    All other components within normal limits  CBC WITH DIFFERENTIAL/PLATELET - Abnormal; Notable for the following components:   Platelets 116 (*)    All other components within normal limits  URINALYSIS, ROUTINE W REFLEX MICROSCOPIC - Abnormal; Notable for the following components:   APPearance CLOUDY (*)    Hgb urine dipstick LARGE (*)    Protein, ur 100 (*)    Leukocytes, UA MODERATE (*)    Bacteria, UA RARE (*)    All other components within normal limits  CULTURE, BLOOD (ROUTINE X 2)  CULTURE, BLOOD (ROUTINE X 2)  URINE CULTURE  INFLUENZA PANEL BY PCR (TYPE A & B)  I-STAT CG4 LACTIC ACID, ED  I-STAT CG4 LACTIC ACID, ED    EKG  EKG Interpretation  Date/Time:  Thursday May 11 2017 15:47:35 EST Ventricular Rate:  80 PR Interval:  212 QRS Duration: 124 QT Interval:  380 QTC Calculation: 438 R Axis:   -26 Text Interpretation:  Sinus rhythm with 1st degree A-V block Left ventricular hypertrophy with QRS widening and repolarization abnormality Abnormal ECG No significant change since last tracing Confirmed by Wandra Arthurs [phone]) on 05/11/2017 6:55:07 PM       Radiology Dg Chest 2 View  Result Date: 05/11/2017 CLINICAL DATA:  Productive cough, shortness of breath. EXAM: CHEST  2 VIEW COMPARISON:  Radiograph of February 15, 2017. FINDINGS: Stable cardiomediastinal silhouette. Status post coronary artery bypass graft. No pneumothorax is noted. Stable elevated left hemidiaphragm. No significant pleural effusion is noted. Mild bibasilar subsegmental atelectasis is noted. Bony thorax is unremarkable. IMPRESSION: Mild bibasilar subsegmental atelectasis. Electronically Signed   By:  Marijo Conception, M.D.   On: 05/11/2017 16:24    Procedures Procedures (including critical care time)  Medications Ordered in ED Medications  sodium chloride 0.9 % bolus 1,000 mL (not administered)  cefTRIAXone (ROCEPHIN) 1 g in sodium chloride 0.9 % 100 mL IVPB (not administered)     Initial Impression / Assessment and Plan / ED Course  I have reviewed the triage vital signs and the nursing notes.  Pertinent labs & imaging results that were available during my care of the patient were reviewed by me and considered in my medical decision making (see chart for details).     LECIL TAPP is a 84 y.o. male here with fever, weakness. Has suprapubic catheter that was changed and has recurrent UTI. Consider flu or pneumonia as well. Will do sepsis workup with CBC, BMP, lactate, cultures, CXR, flu.   7:33 PM WBC nl, lactate nl. UA + RBC and WBC. Previous culture showed multiple species. Ordered rocephin. Swabbed for flu. Given that is too weak to even get up, will admit for IV abx, possible flu.    Final Clinical Impressions(s) / ED Diagnoses   Final diagnoses:  None    ED Discharge Orders    None       Drenda Freeze, MD 05/11/17 2004

## 2017-05-12 DIAGNOSIS — T83511A Infection and inflammatory reaction due to indwelling urethral catheter, initial encounter: Secondary | ICD-10-CM

## 2017-05-12 DIAGNOSIS — E876 Hypokalemia: Secondary | ICD-10-CM

## 2017-05-12 LAB — BASIC METABOLIC PANEL
Anion gap: 11 (ref 5–15)
BUN: 16 mg/dL (ref 6–20)
CO2: 20 mmol/L — ABNORMAL LOW (ref 22–32)
Calcium: 8.3 mg/dL — ABNORMAL LOW (ref 8.9–10.3)
Chloride: 110 mmol/L (ref 101–111)
Creatinine, Ser: 1.07 mg/dL (ref 0.61–1.24)
GFR calc Af Amer: >60 mL/min (ref >60)
GFR calc non Af Amer: >60 mL/min (ref >60)
Glucose, Bld: 102 mg/dL — ABNORMAL HIGH (ref 65–99)
Potassium: 3.4 mmol/L — ABNORMAL LOW (ref 3.5–5.1)
Sodium: 141 mmol/L (ref 135–145)

## 2017-05-12 LAB — PROCALCITONIN: Procalcitonin: <0.1 ng/mL

## 2017-05-12 LAB — CBC
HCT: 40 % (ref 39.0–52.0)
Hemoglobin: 12.8 g/dL — ABNORMAL LOW (ref 13.0–17.0)
MCH: 30.3 pg (ref 26.0–34.0)
MCHC: 32 g/dL (ref 30.0–36.0)
MCV: 94.6 fL (ref 78.0–100.0)
Platelets: 98 10*3/uL — ABNORMAL LOW (ref 150–400)
RBC: 4.23 MIL/uL (ref 4.22–5.81)
RDW: 14.2 % (ref 11.5–15.5)
WBC: 6.2 10*3/uL (ref 4.0–10.5)

## 2017-05-12 LAB — INFLUENZA PANEL BY PCR (TYPE A & B)
Influenza A By PCR: POSITIVE — AB
Influenza B By PCR: NEGATIVE

## 2017-05-12 LAB — STREP PNEUMONIAE URINARY ANTIGEN: Strep Pneumo Urinary Antigen: NEGATIVE

## 2017-05-12 LAB — TROPONIN I: Troponin I: <0.03 ng/mL (ref <0.03)

## 2017-05-12 MED ORDER — SODIUM CHLORIDE 0.9 % IV SOLN
INTRAVENOUS | Status: AC
  Administered 2017-05-12: 15:00:00 via INTRAVENOUS

## 2017-05-12 MED ORDER — CEFTRIAXONE SODIUM 1 G IJ SOLR
1.0000 g | INTRAMUSCULAR | Status: DC
  Administered 2017-05-12 – 2017-05-13 (×2): 1 g via INTRAVENOUS
  Filled 2017-05-12 (×2): qty 10

## 2017-05-12 MED ORDER — ACETAMINOPHEN 650 MG RE SUPP: 650.0000 mg | Freq: Four times a day (QID) | RECTAL | Status: DC | PRN

## 2017-05-12 MED ORDER — POTASSIUM CHLORIDE CRYS ER 20 MEQ PO TBCR
40.0000 meq | EXTENDED_RELEASE_TABLET | Freq: Once | ORAL | Status: AC
  Administered 2017-05-12: 40 meq via ORAL
  Filled 2017-05-12: qty 2

## 2017-05-12 MED ORDER — OSELTAMIVIR PHOSPHATE 30 MG PO CAPS
30.0000 mg | ORAL_CAPSULE | Freq: Two times a day (BID) | ORAL | Status: DC
  Administered 2017-05-12 – 2017-05-15 (×7): 30 mg via ORAL
  Filled 2017-05-12 (×8): qty 1

## 2017-05-12 MED ORDER — MENTHOL 3 MG MT LOZG
1.0000 | LOZENGE | OROMUCOSAL | Status: DC | PRN
  Filled 2017-05-12: qty 9

## 2017-05-12 MED ORDER — PHENOL 1.4 % MT LIQD
1.0000 | OROMUCOSAL | Status: DC | PRN
  Administered 2017-05-12: 1 via OROMUCOSAL
  Filled 2017-05-12: qty 177

## 2017-05-12 MED ORDER — ACETAMINOPHEN 325 MG PO TABS
650.0000 mg | ORAL_TABLET | Freq: Four times a day (QID) | ORAL | Status: DC | PRN
  Administered 2017-05-13 – 2017-05-14 (×2): 650 mg via ORAL
  Filled 2017-05-12 (×2): qty 2

## 2017-05-12 NOTE — Discharge Instructions (Signed)

## 2017-05-12 NOTE — ED Notes (Signed)
Lab to add on strep pneumoniae and legionella pneumophila to urine sample. Lab will call if additional specimen needed.

## 2017-05-12 NOTE — Progress Notes (Signed)
Ronald [name] is a 84 y.o. male patient admitted from ED awake, alert - oriented  X 4 - no acute distress noted.  VSS - Blood pressure (!) 118/94, pulse 67, temperature (!) 97.5 F (36.4 C), temperature source Oral, resp. rate 17, height 5\' 7"  (1.702 m), weight 73.4 kg (161 lb 12.8 oz), SpO2 99 %.    IV in place, occlusive dsg intact without redness.   Will cont to eval and treat per MD orders.  Vidal Schwalbe, RN 05/12/2017 2:24 AM

## 2017-05-12 NOTE — Progress Notes (Signed)
PROGRESS NOTE   Ronald [name]  ERD:[id number]    DOB: 07-12-1927    DOA: 05/11/2017  PCP: Marletta Lor, MD   I have briefly reviewed patients previous medical records in [facility].  Brief Narrative:  84 year old male with PMH of CAD status post CABG 1989 and redo bypass 1997, ischemic cardiomyopathy, A. fib on Eliquis, HTN, HLD, mild dementia, prostate cancer status post radiation seeds, chronic indwelling Foley catheter, had his suprapubic catheter changed at [facility] urology on 05/11/17, presented to ED due to fever, sore throat, rhinorrhea, sneezing, body aches and generalized weakness with difficulty getting up or ambulating.  Febrile in ED.  Ruled in for influenza A.   Assessment & Plan:   Principal Problem:   Sepsis ([place]) Active Problems:   HTN (hypertension)   Urinary tract infection associated with cystostomy catheter ([place])   Coronary artery disease involving coronary bypass graft of native heart without angina pectoris   Suprapubic catheter ([place])   1. Influenza A with respiratory manifestations: Reports that he has received both pneumonia and flu shot this season.  Flu panel PCR positive for influenza A.  RSV panel, urine culture, blood cultures x2: Pending.  Urine pneumococcal antigen negative.  Urine microscopy: Rare bacteria, pyuria and many RBCs.  Chest x-ray without acute findings.  Started Tamiflu.  Treat supportively.  Patient did not meet sepsis criteria on admission. 2. S/P Suprapubic catheter change with possible CAUTI: Catheter was changed outpatient on 2/14.  His entire febrile illness is likely due to influenza but complicated UTI due to recent catheter change cannot be completely excluded.  Continue empirically started IV ceftriaxone but if cultures negative, DC promptly.  Outpatient follow-up with urology. 3. Hypokalemia: Replace and follow.   4. Acute kidney injury: Secondary to febrile illness and volume depletion.  Resolved after IV  fluids. 5. Thrombocytopenia: Appears chronic.  Follow CBCs. 6. Paroxysmal A. fib: Currently in sinus rhythm.  Continue Eliquis. 7. Essential hypertension: Controlled. 8. Hyperlipidemia: Continue atorvastatin. 9. CAD status post CABG/ischemic cardiomyopathy/chronic systolic CHF: Appears volume depleted.  Brief IV fluids with close monitoring.  Temporarily holding Lasix. 10. Prostate cancer: Outpatient follow-up with urology. 11. Dementia without behavioral abnormalities.   DVT prophylaxis: Anticoagulated on Eliquis. Code Status: Full Family Communication: Discussed with spouse at bedside. Disposition: PT evaluation to determine discharge disposition pending clinical improvement.   Consultants:  None  Procedures:  None  Antimicrobials:  IV ceftriaxone 2/14 > Tamiflu 2/15 >   Subjective: Feels significantly better.  Reports some sore throat.  Minimal intermittent dry cough.  No dyspnea, chest pain.  Feels a little stronger.  ROS: As above.  Objective:  Vitals:   05/11/17 2200 05/12/17 0211 05/12/17 0532 05/12/17 1328  BP: (!) 130/58 (!) 118/94 (!) 122/47 (!) 147/80  Pulse: 64 67 79 67  Resp: 15 17 15  (!) 22  Temp:  (!) 97.5 F (36.4 C) 98.9 F (37.2 C) 100.2 F (37.9 C)  TempSrc:  Oral Oral Oral  SpO2: 95% 99% 95% 95%  Weight:  73.4 kg (161 lb 12.8 oz)    Height:  5\' 7"  (1.702 m)      Examination:  General exam: Pleasant elderly male, moderately built and frail, lying comfortably propped up in bed.  Oral mucosa dry. Oral cavity: Throat mildly congested but no drainage.  No thrush. Respiratory system: Slightly diminished breath sounds in the bases but no wheezing, rhonchi or crackles. Respiratory effort normal. Cardiovascular system: S1 & S2 heard, RRR. No JVD, murmurs,  rubs, gallops or clicks. No pedal edema. Gastrointestinal system: Abdomen is nondistended, soft and nontender. No organomegaly or masses felt. Normal bowel sounds heard. Central nervous system:  Alert and oriented. No focal neurological deficits. Extremities: Symmetric 5 x 5 power. Skin: No rashes, lesions or ulcers Psychiatry: Judgement and insight appear normal. Mood & affect appropriate.     Data Reviewed: I have personally reviewed following labs and imaging studies  CBC: Recent Labs  Lab 05/11/17 1553 05/12/17 0300  WBC 5.2 6.2  NEUTROABS 3.5  --   HGB 13.8 12.8*  HCT 42.1 40.0  MCV 94.2 94.6  PLT 116* 98*   Basic Metabolic Panel: Recent Labs  Lab 05/11/17 1553 05/12/17 0300  NA 138 141  K 3.5 3.4*  CL 103 110  CO2 24 20*  GLUCOSE 179* 102*  BUN 17 16  CREATININE 1.25* 1.07  CALCIUM 9.0 8.3*   Liver Function Tests: Recent Labs  Lab 05/10/17 0741 05/11/17 1553  AST 23 30  ALT 19 20  ALKPHOS 85 71  BILITOT 0.8 0.7  PROT 6.5 6.5  ALBUMIN 4.3 3.8   Cardiac Enzymes: Recent Labs  Lab 05/11/17 2146 05/12/17 0300  TROPONINI <0.03 <0.03    No results found for this or any previous visit (from the past 240 hour(s)).       Radiology Studies: Dg Chest 2 View  Result Date: 05/11/2017 CLINICAL DATA:  Productive cough, shortness of breath. EXAM: CHEST  2 VIEW COMPARISON:  Radiograph of February 15, 2017. FINDINGS: Stable cardiomediastinal silhouette. Status post coronary artery bypass graft. No pneumothorax is noted. Stable elevated left hemidiaphragm. No significant pleural effusion is noted. Mild bibasilar subsegmental atelectasis is noted. Bony thorax is unremarkable. IMPRESSION: Mild bibasilar subsegmental atelectasis. Electronically Signed   By: Marijo Conception, M.D.   On: 05/11/2017 16:24        Scheduled Meds: . apixaban  5 mg Oral BID  . atorvastatin  80 mg Oral QPM  . famotidine  20 mg Oral BID  . galantamine  4 mg Oral BID WC  . guaiFENesin  600 mg Oral BID  . hydroxypropyl methylcellulose / hypromellose  1 drop Both Eyes BID  . oseltamivir  30 mg Oral BID   Continuous Infusions: . sodium chloride 75 mL/hr at 05/12/17 0531  .  azithromycin Stopped (05/11/17 2305)  . cefTRIAXone (ROCEPHIN)  IV       LOS: 1 day     Vernell Leep, MD, FACP, [facility]. [facility] Pager [phone]  If 7PM-7AM, please contact night-coverage [email or web address] Password [facility] 05/12/2017, 2:43 PM

## 2017-05-12 NOTE — Evaluation (Signed)
Physical Therapy Evaluation Patient Details Name: Ronald [name] MRN: [id number] DOB: Jul 17, 1927 Today's Date: 05/12/2017   History of Present Illness  Ronald [name] is an 84yo white male who comes to [facility] on 2/14 with midl cough, sputum, and fever, admittied for sepsis related to FluA. PMH: chronic suprapubic catheter s/p Prostate CA, HTN, CAD s/p CABG s/p stents, bells palsy, PAF, mild dementia, HOH.   Clinical Impression  Pt admitted with above diagnosis. Pt currently with functional limitations due to the deficits listed below (see "PT Problem List"). Upon entry, the patient is received supine in bed, NA finishing up personal hygiene with patient s/p bowel movement in bed. The pt is awake and agreeable to participate. Functional mobility assessment demonstrates heavy strength impairment globally the pt now requiring max-total assist +2 physical assistance for bed mobility and transfers (per NA), whereas the patient performed these at a higher level of independence PTA. Pt will benefit from skilled PT intervention to increase independence and safety with basic mobility in preparation for discharge to the venue listed below.       Follow Up Recommendations SNF    Equipment Recommendations  (per discression of receiving facility)    Recommendations for Other Services       Precautions / Restrictions Precautions Precautions: Fall Restrictions Weight Bearing Restrictions: No      Mobility  Bed Mobility Overal bed mobility: Needs Assistance Bed Mobility: Sit to Supine;Supine to Sit     Supine to sit: HOB elevated;Max assist;+2 for physical assistance Sit to supine: Max assist;+2 for physical assistance;HOB elevated   General bed mobility comments: easily fatigued, intentional tremor BUE/trunk and head; denies chills, denies being cold once covered up.   Transfers Overall transfer level: (not appropriate at this time; NA reports +2 totalA prior to entry for transfer to  [facility])                  Ambulation/Gait                Stairs            Wheelchair Mobility    Modified Rankin (Stroke Patients Only)       Balance Overall balance assessment: (denies falls history; NA reports minGuard assist this AM and +2total assist this afternoon with rigidity. )                                           Pertinent Vitals/Pain Pain Assessment: No/denies pain    Home Living Family/patient expects to be discharged to:: Private residence Living Arrangements: Spouse/significant other Available Help at Discharge: Family Type of Home: House Home Access: Stairs to enter Entrance Stairs-Rails: Left Entrance Stairs-Number of Steps: 5 Home Layout: One level Home Equipment: Environmental consultant - 2 wheels;Cane - single point      Prior Function Level of Independence: Independent with assistive device(s)         Comments: SPC for household distances     Hand Dominance   Dominant Hand: Right    Extremity/Trunk Assessment   Upper Extremity Assessment Upper Extremity Assessment: Generalized weakness;Overall WFL for tasks assessed(intentional tremor in BUE, head.)    Lower Extremity Assessment Lower Extremity Assessment: Generalized weakness    Cervical / Trunk Assessment Cervical / Trunk Assessment: Normal  Communication   Communication: HOH  Cognition Arousal/Alertness: Lethargic   Overall Cognitive Status: No family/caregiver present to determine  baseline cognitive functioning(Oriented to self, situation, and place; inconsistent reponses to questiosn at times, unclear if related to AMS or [facility]. )                                        General Comments      Exercises     Assessment/Plan    PT Assessment Patient needs continued PT services  PT Problem List Decreased strength;Decreased range of motion;Decreased activity tolerance;Decreased balance;Decreased mobility;Pain       PT Treatment  Interventions Gait training;Stair training;Functional mobility training;Therapeutic activities;Therapeutic exercise;Patient/family education;Balance training    PT Goals (Current goals can be found in the Care Plan section)  Acute Rehab PT Goals Patient Stated Goal: regain strength and independent household distance aMB  PT Goal Formulation: With patient Time For Goal Achievement: 05/26/17 Potential to Achieve Goals: Fair    Frequency Min 3X/week   Barriers to discharge Inaccessible home environment 5 steps at entry    Co-evaluation               AM-PAC PT "6 Clicks" Daily Activity  Outcome Measure Difficulty turning over in bed (including adjusting bedclothes, sheets and blankets)?: Unable Difficulty moving from lying on back to sitting on the side of the bed? : Unable Difficulty sitting down on and standing up from a chair with arms (e.g., wheelchair, bedside commode, etc,.)?: Unable Help needed moving to and from a bed to chair (including a wheelchair)?: Total Help needed walking in hospital room?: Total Help needed climbing 3-5 steps with a railing? : Total 6 Click Score: 6    End of Session   Activity Tolerance: Patient limited by fatigue Patient left: in bed;with bed alarm set;with call bell/phone within reach Nurse Communication: Mobility status PT Visit Diagnosis: Unsteadiness on feet (R26.81);Difficulty in walking, not elsewhere classified (R26.2)    Time: 8871-9597 PT Time Calculation (min) (ACUTE ONLY): 16 min   Charges:   PT Evaluation $PT Eval High Complexity: 1 High     PT G Codes:        4:31 PM, 11-Jun-2017 Etta Grandchild, PT, DPT Physical Therapist - [place] [phone] (Pager)  [phone] (Office)      Buccola,Allan C 11-Jun-2017, 4:29 PM

## 2017-05-12 NOTE — Progress Notes (Signed)
Pt c/o of sore throat this AM. Pt stated it is painful to swallow. Pt VSS. On continue pulse ox and satting 95-99% on RA. Suction been set up at bedside. Made MD on call aware. Cepacol has been ordered and will give to pt.

## 2017-05-13 DIAGNOSIS — T83510D Infection and inflammatory reaction due to cystostomy catheter, subsequent encounter: Secondary | ICD-10-CM

## 2017-05-13 DIAGNOSIS — J101 Influenza due to other identified influenza virus with other respiratory manifestations: Secondary | ICD-10-CM

## 2017-05-13 DIAGNOSIS — N390 Urinary tract infection, site not specified: Secondary | ICD-10-CM

## 2017-05-13 DIAGNOSIS — I25810 Atherosclerosis of coronary artery bypass graft(s) without angina pectoris: Secondary | ICD-10-CM

## 2017-05-13 DIAGNOSIS — Z9359 Other cystostomy status: Secondary | ICD-10-CM

## 2017-05-13 LAB — BASIC METABOLIC PANEL
Anion gap: 11 (ref 5–15)
BUN: 15 mg/dL (ref 6–20)
CO2: 21 mmol/L — ABNORMAL LOW (ref 22–32)
Calcium: 8.1 mg/dL — ABNORMAL LOW (ref 8.9–10.3)
Chloride: 105 mmol/L (ref 101–111)
Creatinine, Ser: 1.23 mg/dL (ref 0.61–1.24)
GFR calc Af Amer: 59 mL/min — ABNORMAL LOW (ref >60)
GFR calc non Af Amer: 51 mL/min — ABNORMAL LOW (ref >60)
Glucose, Bld: 132 mg/dL — ABNORMAL HIGH (ref 65–99)
Potassium: 3.5 mmol/L (ref 3.5–5.1)
Sodium: 137 mmol/L (ref 135–145)

## 2017-05-13 LAB — RESPIRATORY PANEL BY PCR

## 2017-05-13 LAB — CBC
HCT: 40.2 % (ref 39.0–52.0)
Hemoglobin: 13 g/dL (ref 13.0–17.0)
MCH: 30.6 pg (ref 26.0–34.0)
MCHC: 32.3 g/dL (ref 30.0–36.0)
MCV: 94.6 fL (ref 78.0–100.0)
Platelets: 95 10*3/uL — ABNORMAL LOW (ref 150–400)
RBC: 4.25 MIL/uL (ref 4.22–5.81)
RDW: 14.4 % (ref 11.5–15.5)
WBC: 8.3 10*3/uL (ref 4.0–10.5)

## 2017-05-13 LAB — LEGIONELLA PNEUMOPHILA SEROGP 1 UR AG: L. pneumophila Serogp 1 Ur Ag: NEGATIVE

## 2017-05-13 LAB — MAGNESIUM: Magnesium: 1.9 mg/dL (ref 1.7–2.4)

## 2017-05-13 LAB — EXPECTORATED SPUTUM ASSESSMENT W GRAM STAIN, RFLX TO RESP C

## 2017-05-13 NOTE — Progress Notes (Signed)
PROGRESS NOTE  Ronald [name]  TDV:[id number] DOB: September 04, 1927 DOA: 05/11/2017 PCP: Marletta Lor, MD   Brief Narrative: Ronald [name] is an 84 y.o. male with a history of CAD s/p CABG 1989 and redo 1997, ischemic cardiomyopathy, AFib on eliquis, HTN, HLD, mild dementia, prostate CA s/p radiation seeds with chronic suprapubic foley catheter who presented to the ED 2/14 with fever, sore throat, cough and generalized weakness/body aches the afternoon after suprapubic catheter exchange at [facility] urology. He was febrile on arrival, diffusely weak and has positive swab for influenza A. Tamiflu was started with modest gains in weakness. PT has recommended SNF at discharge.   Assessment & Plan: Principal Problem:   Sepsis ([place]) Active Problems:   HTN (hypertension)   Urinary tract infection associated with cystostomy catheter ([place])   Coronary artery disease involving coronary bypass graft of native heart without angina pectoris   Suprapubic catheter ([place])  Influenza A with respiratory manifestations: Received pneumonia and flu shot this season. PCT and lactic acid reassuring. RVP otherwise negative. No leukocytosis or infiltrate on CXR.  - Continue tamiflu x5 days, renal dosing - Monitor blood cultures x2  Chronic suprapubic catheter with possible CAUTI: Changed 2/14 by urologist.  - Monitoring urine culture on empiric ceftriaxone.  - Follow up with urology as scheduled  Hypokalemia: Resolved  Thrombocytopenia: Chronic, stable. No bleeding. - Monitor  Paroxysmal A. fib: Currently in sinus rhythm.   - Continue Eliquis.  Essential hypertension: Controlled.  Hyperlipidemia:  - Continue atorvastatin.  CAD status post CABG/ischemic cardiomyopathy/chronic systolic CHF: Appears euvolemic.  - Can restart lasix prn  Prostate cancer:  - Outpatient follow-up with urology.  Dementia without behavioral abnormalities:  - Delirium reviewed with family. - Continue galantamine  DVT  prophylaxis: Eliquis Code Status: Full Family Communication: Wife and daughter at bedside Disposition Plan: SNF when bed available. He is expected to continue improving but remains too weak for disposition to home. It would not be safe.  Consultants: None Procedures: None Antimicrobials: Tamiflu  Subjective: Feels less weak than yesterday, still sore throat and intermittent chills. No chest pain or dyspnea.  Objective: Vitals:   05/12/17 1622 05/12/17 2219 05/13/17 0502 05/13/17 1351  BP:  125/60 (!) 117/53 (!) 131/55  Pulse: 68 78 66 (!) 57  Resp:  19 18 18   Temp:  97.8 F (36.6 C) 99.4 F (37.4 C) 98.6 F (37 C)  TempSrc:  Oral Oral Oral  SpO2: 96% 93% 93% 93%  Weight:      Height:        Intake/Output Summary (Last 24 hours) at 05/13/2017 1617 Last data filed at 05/13/2017 1351 Gross per 24 hour  Intake 1325 ml  Output 1600 ml  Net -275 ml   Filed Weights   05/12/17 0211  Weight: 73.4 kg (161 lb 12.8 oz)    Gen: Elderly male in no distress Pulm: Non-labored and clear bilaterally.  CV: Regular rate and rhythm. No murmur, rub, or gallop. No JVD, no pedal edema. GI: Abdomen soft, mildly tender around suprapubic site, non-distended, with normoactive bowel sounds. No organomegaly or masses felt. Ext: Warm, no deformities Skin: Suprapubic catheter site clean without discharge or erythema. Otherwise no rashes, lesions, ulcers Neuro: Alert and oriented. No focal neurological deficits. Psych: Judgement and insight appear fair, intermittent word blocking with linear thought process. Mood & affect appropriate.   Data Reviewed: I have personally reviewed following labs and imaging studies  CBC: Recent Labs  Lab 05/11/17 1553 05/12/17 0300 05/13/17  0351  WBC 5.2 6.2 8.3  NEUTROABS 3.5  --   --   HGB 13.8 12.8* 13.0  HCT 42.1 40.0 40.2  MCV 94.2 94.6 94.6  PLT 116* 98* 95*   Basic Metabolic Panel: Recent Labs  Lab 05/11/17 1553 05/12/17 0300 05/13/17 0351  NA  138 141 137  K 3.5 3.4* 3.5  CL 103 110 105  CO2 24 20* 21*  GLUCOSE 179* 102* 132*  BUN 17 16 15   CREATININE 1.25* 1.07 1.23  CALCIUM 9.0 8.3* 8.1*  MG  --   --  1.9   GFR: Estimated Creatinine Clearance: 39.6 mL/min (by C-G formula based on SCr of 1.23 mg/dL). Liver Function Tests: Recent Labs  Lab 05/10/17 0741 05/11/17 1553  AST 23 30  ALT 19 20  ALKPHOS 85 71  BILITOT 0.8 0.7  PROT 6.5 6.5  ALBUMIN 4.3 3.8   No results for input(s): LIPASE, AMYLASE in the last 168 hours. No results for input(s): AMMONIA in the last 168 hours. Coagulation Profile: No results for input(s): INR, PROTIME in the last 168 hours. Cardiac Enzymes: Recent Labs  Lab 05/11/17 2146 05/12/17 0300  TROPONINI <0.03 <0.03   BNP (last 3 results) No results for input(s): PROBNP in the last 8760 hours. HbA1C: No results for input(s): HGBA1C in the last 72 hours. CBG: No results for input(s): GLUCAP in the last 168 hours. Lipid Profile: No results for input(s): CHOL, HDL, LDLCALC, TRIG, CHOLHDL, LDLDIRECT in the last 72 hours. Thyroid Function Tests: No results for input(s): TSH, T4TOTAL, FREET4, T3FREE, THYROIDAB in the last 72 hours. Anemia Panel: No results for input(s): VITAMINB12, FOLATE, FERRITIN, TIBC, IRON, RETICCTPCT in the last 72 hours. Urine analysis:    Component Value Date/Time   COLORURINE YELLOW 05/11/2017 1632   APPEARANCEUR CLOUDY (A) 05/11/2017 1632   LABSPEC 1.017 05/11/2017 1632   PHURINE 6.0 05/11/2017 1632   GLUCOSEU NEGATIVE 05/11/2017 1632   HGBUR LARGE (A) 05/11/2017 1632   BILIRUBINUR NEGATIVE 05/11/2017 1632   BILIRUBINUR neg 06/30/2014 1530   KETONESUR NEGATIVE 05/11/2017 1632   PROTEINUR 100 (A) 05/11/2017 1632   UROBILINOGEN 0.2 12/04/2014 2344   NITRITE NEGATIVE 05/11/2017 1632   LEUKOCYTESUR MODERATE (A) 05/11/2017 1632   Recent Results (from the past 240 hour(s))  Blood culture (routine x 2)     Status: None (Preliminary result)   Collection Time:  05/11/17  7:30 PM  Result Value Ref Range Status   Specimen Description BLOOD LEFT WRIST  Final   Special Requests   Final    BOTTLES DRAWN AEROBIC AND ANAEROBIC Blood Culture adequate volume   Culture   Final    NO GROWTH 2 DAYS Performed at [facility], [place] [address] [place], [place] [address]    Report Status PENDING  Incomplete  Blood culture (routine x 2)     Status: None (Preliminary result)   Collection Time: 05/11/17  7:35 PM  Result Value Ref Range Status   Specimen Description BLOOD RIGHT WRIST  Final   Special Requests   Final    BOTTLES DRAWN AEROBIC AND ANAEROBIC Blood Culture adequate volume   Culture   Final    NO GROWTH 2 DAYS Performed at [facility], [place] [address] [place], [place] [address]    Report Status PENDING  Incomplete  Urine culture     Status: Abnormal (Preliminary result)   Collection Time: 05/11/17  8:12 PM  Result Value Ref Range Status   Specimen Description  URINE, CATHETERIZED  Final   Special Requests NONE  Final   Culture (A)  Final    60,000 COLONIES/mL STAPHYLOCOCCUS AUREUS SUSCEPTIBILITIES TO FOLLOW Performed at [facility], [place] [address] [place], [place] [address]    Report Status PENDING  Incomplete  Respiratory Panel by PCR     Status: None   Collection Time: 05/11/17  8:39 PM  Result Value Ref Range Status   Adenovirus NOT DETECTED NOT DETECTED Final   Coronavirus 229E NOT DETECTED NOT DETECTED Final   Coronavirus HKU1 NOT DETECTED NOT DETECTED Final   Coronavirus NL63 NOT DETECTED NOT DETECTED Final   Coronavirus OC43 NOT DETECTED NOT DETECTED Final   Metapneumovirus NOT DETECTED NOT DETECTED Final   Rhinovirus / Enterovirus NOT DETECTED NOT DETECTED Final   Influenza A NOT DETECTED NOT DETECTED Final   Influenza B NOT DETECTED NOT DETECTED Final   Parainfluenza Virus 1 NOT DETECTED NOT DETECTED Final   Parainfluenza Virus 2 NOT DETECTED NOT DETECTED Final   Parainfluenza Virus 3 NOT DETECTED NOT  DETECTED Final   Parainfluenza Virus 4 NOT DETECTED NOT DETECTED Final   Respiratory Syncytial Virus NOT DETECTED NOT DETECTED Final   Bordetella pertussis NOT DETECTED NOT DETECTED Final   Chlamydophila pneumoniae NOT DETECTED NOT DETECTED Final   Mycoplasma pneumoniae NOT DETECTED NOT DETECTED Final    Comment: Performed at [facility], [place] [address], [place], [place] [address]  Culture, sputum-assessment     Status: None   Collection Time: 05/13/17 10:24 AM  Result Value Ref Range Status   Specimen Description SPUTUM  Final   Special Requests NONE  Final   Sputum evaluation   Final    THIS SPECIMEN IS ACCEPTABLE FOR SPUTUM CULTURE Performed at [facility], [address], [place], [place] [address]    Report Status 05/13/2017 FINAL  Final  Culture, respiratory (NON-Expectorated)     Status: None (Preliminary result)   Collection Time: 05/13/17 10:24 AM  Result Value Ref Range Status   Specimen Description SPUTUM  Final   Special Requests NONE Reflexed from [id number]  Final   Gram Stain   Final    ABUNDANT WBC PRESENT,BOTH PMN AND MONONUCLEAR FEW SQUAMOUS EPITHELIAL CELLS PRESENT FEW GRAM POSITIVE COCCI IN PAIRS FEW GRAM NEGATIVE RODS FEW GRAM POSITIVE RODS Performed at [facility], [place] [address], [place], [place] [address]    Culture PENDING  Incomplete   Report Status PENDING  Incomplete      Radiology Studies: Dg Chest 2 View  Result Date: 05/11/2017 CLINICAL DATA:  Productive cough, shortness of breath. EXAM: CHEST  2 VIEW COMPARISON:  Radiograph of February 15, 2017. FINDINGS: Stable cardiomediastinal silhouette. Status post coronary artery bypass graft. No pneumothorax is noted. Stable elevated left hemidiaphragm. No significant pleural effusion is noted. Mild bibasilar subsegmental atelectasis is noted. Bony thorax is unremarkable. IMPRESSION: Mild bibasilar subsegmental atelectasis. Electronically Signed   By: Marijo Conception, M.D.   On: 05/11/2017  16:24    Scheduled Meds: . apixaban  5 mg Oral BID  . atorvastatin  80 mg Oral QPM  . famotidine  20 mg Oral BID  . galantamine  4 mg Oral BID WC  . guaiFENesin  600 mg Oral BID  . hydroxypropyl methylcellulose / hypromellose  1 drop Both Eyes BID  . oseltamivir  30 mg Oral BID   Continuous Infusions: . cefTRIAXone (ROCEPHIN)  IV Stopped (05/12/17 2309)     LOS: 2  days   Time spent: 25 minutes.  Vance Gather, MD [facility] Pager [phone]  If 7PM-7AM, please contact night-coverage [email or web address] Password [facility] 05/13/2017, 4:17 PM

## 2017-05-14 LAB — URINE CULTURE: Culture: 60000 — AB

## 2017-05-14 MED ORDER — POTASSIUM CHLORIDE CRYS ER 20 MEQ PO TBCR
20.0000 meq | EXTENDED_RELEASE_TABLET | Freq: Once | ORAL | Status: AC
  Administered 2017-05-14: 20 meq via ORAL
  Filled 2017-05-14: qty 1

## 2017-05-14 MED ORDER — FUROSEMIDE 20 MG PO TABS
20.0000 mg | ORAL_TABLET | Freq: Once | ORAL | Status: AC
  Administered 2017-05-14: 20 mg via ORAL
  Filled 2017-05-14: qty 1

## 2017-05-14 NOTE — Progress Notes (Signed)
PROGRESS NOTE  Ronald [name]  WOE:[id number] DOB: 1927-06-20 DOA: 05/11/2017 PCP: Marletta Lor, MD   Brief Narrative: Ronald [name] is an 84 y.o. male with a history of CAD s/p CABG 1989 and redo 1997, ischemic cardiomyopathy, AFib on eliquis, HTN, HLD, mild dementia, prostate CA s/p radiation seeds with chronic suprapubic foley catheter who presented to the ED 2/14 with fever, sore throat, cough and generalized weakness/body aches the afternoon after suprapubic catheter exchange at [facility] urology. He was febrile on arrival, diffusely weak and has positive swab for influenza A. Tamiflu was started with modest gains in weakness. PT has recommended SNF at discharge.   Assessment & Plan: Principal Problem:   Sepsis ([place]) Active Problems:   HTN (hypertension)   Urinary tract infection associated with cystostomy catheter ([place])   Coronary artery disease involving coronary bypass graft of native heart without angina pectoris   Suprapubic catheter ([place])  Influenza A with respiratory manifestations: Received pneumonia and flu shot this season. PCT and lactic acid reassuring. RVP otherwise negative. No leukocytosis or infiltrate on CXR.  - Continue tamiflu x5 days, renal dosing. - Monitor blood cultures x2 (NGTD) and sputum culture - Flutter valve and incentive spirometry.   Chronic suprapubic catheter: Changed 2/14 by urologist. Urine culture grew 60k MSSA felt to be more consistent with colonization.  - DC ceftriaxone and monitor - Follow up with urology as scheduled  Hypokalemia: Resolved. Will give K with single dose of lasix  Thrombocytopenia: Chronic, stable. No bleeding. - Monitor  Paroxysmal A. fib: Currently in sinus rhythm.   - Continue eliquis.  Essential hypertension: Controlled.  Hyperlipidemia:  - Continue atorvastatin.  CAD status post CABG/ischemic cardiomyopathy/chronic systolic CHF:  - Can restart lasix prn, will give single dose 2/17 with mild basilar  crackles.  Prostate cancer:  - Outpatient follow-up with urology.  Dementia without behavioral abnormalities:  - Delirium reviewed with family. - Continue galantamine  DVT prophylaxis: Eliquis Code Status: Full Family Communication: Wife at bedside Disposition Plan: SNF when bed available per PT recommendation, CSW has been consulted. Will have PT reevaluation 2/18.   Consultants: None Procedures: None Antimicrobials: Tamiflu  Subjective: "Having a hard time with my breathing today." Coughing more but feels like he can't get it all up. No fevers, chest pain, or palpitations. In higher spirits than previously.  Objective: Vitals:   05/13/17 0502 05/13/17 1351 05/13/17 2250 05/14/17 0442  BP: (!) 117/53 (!) 131/55 (!) 130/45 (!) 122/52  Pulse: 66 (!) 57 61 61  Resp: 18 18 16 18   Temp: 99.4 F (37.4 C) 98.6 F (37 C) 99 F (37.2 C) 98 F (36.7 C)  TempSrc: Oral Oral Oral   SpO2: 93% 93% 96% 92%  Weight:      Height:        Intake/Output Summary (Last 24 hours) at 05/14/2017 1344 Last data filed at 05/14/2017 0945 Gross per 24 hour  Intake 540 ml  Output 1550 ml  Net -1010 ml   Filed Weights   05/12/17 0211  Weight: 73.4 kg (161 lb 12.8 oz)    Gen: Elderly male in no distress sitting in bedside chair Pulm: Non-labored, rhonchi present bilaterally and faint crackles at the bases.  CV: Regular rate and rhythm. No murmur, rub, or gallop. No JVD, trace pedal edema. GI: Abdomen soft, mildly tender around suprapubic site, non-distended, with normoactive bowel sounds. No organomegaly or masses felt. Ext: Warm, no deformities Skin: Suprapubic catheter site clean without discharge or erythema. Otherwise  no rashes, lesions, ulcers Neuro: Alert and oriented. No focal neurological deficits. Psych: Judgement and insight appear fair, intermittent word blocking with linear thought process. Mood & affect appropriate.   Data Reviewed: I have personally reviewed following labs and  imaging studies  CBC: Recent Labs  Lab 05/11/17 1553 05/12/17 0300 05/13/17 0351  WBC 5.2 6.2 8.3  NEUTROABS 3.5  --   --   HGB 13.8 12.8* 13.0  HCT 42.1 40.0 40.2  MCV 94.2 94.6 94.6  PLT 116* 98* 95*   Basic Metabolic Panel: Recent Labs  Lab 05/11/17 1553 05/12/17 0300 05/13/17 0351  NA 138 141 137  K 3.5 3.4* 3.5  CL 103 110 105  CO2 24 20* 21*  GLUCOSE 179* 102* 132*  BUN 17 16 15   CREATININE 1.25* 1.07 1.23  CALCIUM 9.0 8.3* 8.1*  MG  --   --  1.9   GFR: Estimated Creatinine Clearance: 39.6 mL/min (by C-G formula based on SCr of 1.23 mg/dL).   Liver Function Tests: Recent Labs  Lab 05/10/17 0741 05/11/17 1553  AST 23 30  ALT 19 20  ALKPHOS 85 71  BILITOT 0.8 0.7  PROT 6.5 6.5  ALBUMIN 4.3 3.8   Cardiac Enzymes: Recent Labs  Lab 05/11/17 2146 05/12/17 0300  TROPONINI <0.03 <0.03   Urine analysis:    Component Value Date/Time   COLORURINE YELLOW 05/11/2017 1632   APPEARANCEUR CLOUDY (A) 05/11/2017 1632   LABSPEC 1.017 05/11/2017 1632   PHURINE 6.0 05/11/2017 1632   GLUCOSEU NEGATIVE 05/11/2017 1632   HGBUR LARGE (A) 05/11/2017 1632   BILIRUBINUR NEGATIVE 05/11/2017 1632   BILIRUBINUR neg 06/30/2014 1530   KETONESUR NEGATIVE 05/11/2017 1632   PROTEINUR 100 (A) 05/11/2017 1632   UROBILINOGEN 0.2 12/04/2014 2344   NITRITE NEGATIVE 05/11/2017 1632   LEUKOCYTESUR MODERATE (A) 05/11/2017 1632   Recent Results (from the past 240 hour(s))  Blood culture (routine x 2)     Status: None (Preliminary result)   Collection Time: 05/11/17  7:30 PM  Result Value Ref Range Status   Specimen Description BLOOD LEFT WRIST  Final   Special Requests   Final    BOTTLES DRAWN AEROBIC AND ANAEROBIC Blood Culture adequate volume   Culture   Final    NO GROWTH 3 DAYS Performed at [facility], [place] [address] [place], [place] [address]    Report Status PENDING  Incomplete  Blood culture (routine x 2)     Status: None (Preliminary result)   Collection  Time: 05/11/17  7:35 PM  Result Value Ref Range Status   Specimen Description BLOOD RIGHT WRIST  Final   Special Requests   Final    BOTTLES DRAWN AEROBIC AND ANAEROBIC Blood Culture adequate volume   Culture   Final    NO GROWTH 3 DAYS Performed at [facility], [place] [address], [place], [place] [address]    Report Status PENDING  Incomplete  Urine culture     Status: Abnormal   Collection Time: 05/11/17  8:12 PM  Result Value Ref Range Status   Specimen Description URINE, CATHETERIZED  Final   Special Requests   Final    NONE Performed at [facility], [place] [address], [place], [place] [address]    Culture 60,000 COLONIES/mL STAPHYLOCOCCUS AUREUS (A)  Final   Report Status 05/14/2017 FINAL  Final   Organism ID, Bacteria STAPHYLOCOCCUS AUREUS (A)  Final      Susceptibility   Staphylococcus aureus -  MIC*    CIPROFLOXACIN >=8 RESISTANT Resistant     GENTAMICIN <=0.5 SENSITIVE Sensitive     NITROFURANTOIN <=16 SENSITIVE Sensitive     OXACILLIN 0.5 SENSITIVE Sensitive     TETRACYCLINE <=1 SENSITIVE Sensitive     VANCOMYCIN <=0.5 SENSITIVE Sensitive     TRIMETH/SULFA <=10 SENSITIVE Sensitive     CLINDAMYCIN <=0.25 SENSITIVE Sensitive     RIFAMPIN <=0.5 SENSITIVE Sensitive     Inducible Clindamycin NEGATIVE Sensitive     * 60,000 COLONIES/mL STAPHYLOCOCCUS AUREUS  Respiratory Panel by PCR     Status: None   Collection Time: 05/11/17  8:39 PM  Result Value Ref Range Status   Adenovirus NOT DETECTED NOT DETECTED Final   Coronavirus 229E NOT DETECTED NOT DETECTED Final   Coronavirus HKU1 NOT DETECTED NOT DETECTED Final   Coronavirus NL63 NOT DETECTED NOT DETECTED Final   Coronavirus OC43 NOT DETECTED NOT DETECTED Final   Metapneumovirus NOT DETECTED NOT DETECTED Final   Rhinovirus / Enterovirus NOT DETECTED NOT DETECTED Final   Influenza A NOT DETECTED NOT DETECTED Final   Influenza B NOT DETECTED NOT DETECTED Final   Parainfluenza Virus 1 NOT DETECTED NOT DETECTED  Final   Parainfluenza Virus 2 NOT DETECTED NOT DETECTED Final   Parainfluenza Virus 3 NOT DETECTED NOT DETECTED Final   Parainfluenza Virus 4 NOT DETECTED NOT DETECTED Final   Respiratory Syncytial Virus NOT DETECTED NOT DETECTED Final   Bordetella pertussis NOT DETECTED NOT DETECTED Final   Chlamydophila pneumoniae NOT DETECTED NOT DETECTED Final   Mycoplasma pneumoniae NOT DETECTED NOT DETECTED Final    Comment: Performed at [facility], [place]. [address], [place], [place] [address]  Culture, sputum-assessment     Status: None   Collection Time: 05/13/17 10:24 AM  Result Value Ref Range Status   Specimen Description SPUTUM  Final   Special Requests NONE  Final   Sputum evaluation   Final    THIS SPECIMEN IS ACCEPTABLE FOR SPUTUM CULTURE Performed at [facility], [address] [place], [place] [address]    Report Status 05/13/2017 FINAL  Final  Culture, respiratory (NON-Expectorated)     Status: None (Preliminary result)   Collection Time: 05/13/17 10:24 AM  Result Value Ref Range Status   Specimen Description SPUTUM  Final   Special Requests NONE Reflexed from [id number]  Final   Gram Stain   Final    ABUNDANT WBC PRESENT,BOTH PMN AND MONONUCLEAR FEW SQUAMOUS EPITHELIAL CELLS PRESENT FEW GRAM POSITIVE COCCI IN PAIRS FEW GRAM NEGATIVE RODS FEW GRAM POSITIVE RODS    Culture   Final    TOO YOUNG TO READ Performed at [facility], [place] [address], [place], [place] [address]    Report Status PENDING  Incomplete      Radiology Studies: No results found.  Scheduled Meds: . apixaban  5 mg Oral BID  . atorvastatin  80 mg Oral QPM  . famotidine  20 mg Oral BID  . galantamine  4 mg Oral BID WC  . guaiFENesin  600 mg Oral BID  . hydroxypropyl methylcellulose / hypromellose  1 drop Both Eyes BID  . oseltamivir  30 mg Oral BID   Continuous Infusions: . cefTRIAXone (ROCEPHIN)  IV Stopped (05/13/17 2220)     LOS: 3 days   Time spent: 25 minutes.  Vance Gather,  MD [facility] Pager [phone]  If 7PM-7AM, please contact night-coverage [email or web address] Password [email or web address] 05/14/2017, 1:44 PM

## 2017-05-14 NOTE — Progress Notes (Signed)
RT note: RT instructed patient with use of Flutter valve using teach back method. Patient displayed understanding and proper use, with productive cough post use of device.

## 2017-05-15 DIAGNOSIS — J111 Influenza due to unidentified influenza virus with other respiratory manifestations: Secondary | ICD-10-CM

## 2017-05-15 MED ORDER — OSELTAMIVIR PHOSPHATE 30 MG PO CAPS: 30.0000 mg | ORAL_CAPSULE | Freq: Two times a day (BID) | ORAL | 0 refills | Status: DC

## 2017-05-15 NOTE — Progress Notes (Signed)
Discharge home. Home discharge instructions given , Son and daughter  was also given instruction for the patient. No question verbalized.

## 2017-05-15 NOTE — Discharge Summary (Addendum)
Physician Discharge Summary  Ronald [name] RXV:[id number] DOB: 07-26-1927 DOA: 05/11/2017  PCP: Marletta Lor, MD  Admit date: 05/11/2017 Discharge date: 05/15/2017  Admitted From: Home Disposition: Home   Recommendations for Outpatient Follow-up:  1. Follow up with PCP in 1-2 weeks 2. Please obtain BMP/CBC in one week  Home Health: PT Equipment/Devices: None new Discharge Condition: Stable CODE STATUS: Full Diet recommendation: Heart healthy  Brief/Interim Summary: Ronald [name] is an 84 y.o. male with a history of CAD s/p CABG 1989 and redo 1997, ischemic cardiomyopathy, AFib on eliquis, HTN, HLD, mild dementia, prostate CA s/p radiation seeds with chronic suprapubic foley catheter who presented to the ED 2/14 with fever, sore throat, cough and generalized weakness/body aches the afternoon after suprapubic catheter exchange at [facility] urology. He was febrile on arrival, diffusely weak and has positive swab for influenza A. Tamiflu was started with improvement. He was initially going to need SNF placement for rehabilitation, though his weakness has improved to the point the he may be discharged to home with home health assistance.  Discharge Diagnoses:  Principal Problem:   Influenza A Active Problems:   HTN (hypertension)   Urinary tract infection associated with cystostomy catheter ([place])   Coronary artery disease involving coronary bypass graft of native heart without angina pectoris   Suprapubic catheter ([place])  Influenza A with respiratory manifestations:Received pneumonia and flu shot this season. PCT and lactic acid reassuring. RVP otherwise negative. No leukocytosis or infiltrate on CXR. Did NOT have sepsis.  - Continue tamiflu x5 days, renal dosing 2/15 - 2/19. - Monitor blood cultures x2 (NGTD) and sputum culture (reincubated for better growth) - Flutter valve provided  Chronic suprapubic catheter:Changed 2/14 by urologist. Urine culture grew 60k MSSA felt to be  more consistent with colonization.  - continue monitoring off ceftriaxone - Follow up with urology as scheduled  Hypokalemia:Resolved.   Thrombocytopenia:Chronic, stable. No bleeding.  Paroxysmal A. fib: Currently in sinus rhythm.  - Continue eliquis.  Essential hypertension: Controlled.  Hyperlipidemia:  - Continue atorvastatin.  CAD status post CABG/ischemic cardiomyopathy/chronic systolic CHF: - Can restart lasix prn  Prostate cancer: - Outpatient follow-up with urology.  Dementia without behavioral abnormalities:  - Fortunately did not experience any agitated delirium. - Continue galantamine  Discharge Instructions Discharge Instructions    Diet - low sodium heart healthy   Complete by:  As directed    Discharge instructions   Complete by:  As directed    You were admitted for weakness due to the flu and have improved with treatment.  - Home health PT will be arranged prior to discharge - Continue taking tamiflu twice daily for 3 more doses (sent to your pharmacy) - Follow up with Dr. Raliegh Ip in the next 2 weeks - Seek medical attention if your symptoms return   Increase activity slowly   Complete by:  As directed      Allergies as of 05/15/2017      Reactions   Pantoprazole Other (See Comments)   Headache and lightheaded   Nitrofurantoin Hives   Sulfa Antibiotics Hives, Itching   Lidocaine Swelling   Mouth swelling   Tape Other (See Comments)   TAPE WILL TEAR THE SKIN!!!      Medication List    TAKE these medications   acetaminophen 500 MG tablet Commonly known as:  TYLENOL Take 1,000 mg by mouth every 6 (six) hours as needed for pain or fever.   apixaban 5 MG Tabs tablet Commonly known as:  ELIQUIS Take 1 tablet (5 mg total) by mouth 2 (two) times daily.   atorvastatin 80 MG tablet Commonly known as:  LIPITOR Take 1 tablet (80 mg total) by mouth every evening.   furosemide 20 MG tablet Commonly known as:  LASIX Take 1 tablet (20 mg  total) by mouth daily as needed. For swelling What changed:    reasons to take this  additional instructions   galantamine 8 MG 24 hr capsule Commonly known as:  RAZADYNE ER Take 8 mg by mouth daily with breakfast.   hydrocortisone 2.5 % rectal cream Commonly known as:  ANUSOL-HC Place 1 application rectally 2 (two) times daily. What changed:    when to take this  reasons to take this   hydroxypropyl methylcellulose / hypromellose 2.5 % ophthalmic solution Commonly known as:  ISOPTO TEARS / GONIOVISC Place 1 drop into both eyes 2 (two) times daily.   multivitamin with minerals Tabs tablet Take 1 tablet by mouth daily.   nitroGLYCERIN 0.4 MG SL tablet Commonly known as:  NITROSTAT Place 0.4 mg under the tongue every 5 (five) minutes as needed for chest pain.   oseltamivir 30 MG capsule Commonly known as:  TAMIFLU Take 1 capsule (30 mg total) by mouth 2 (two) times daily.   ranitidine 150 MG tablet Commonly known as:  ZANTAC Take 1 tablet (150 mg total) by mouth 2 (two) times daily.   TUMS PO Take 3 tablets by mouth daily as needed (gas).      Follow-up Information    Marletta Lor, MD. Schedule an appointment as soon as possible for a visit in 2 week(s).   Specialty:  Internal Medicine Contact information: [place] [address] [phone]        Burnell Blanks, MD .   Specialty:  Cardiology Contact information: [place] [address] [place] [address] [phone]          Allergies  Allergen Reactions  . Pantoprazole Other (See Comments)    Headache and lightheaded  . Nitrofurantoin Hives  . Sulfa Antibiotics Hives and Itching  . Lidocaine Swelling    Mouth swelling   . Tape Other (See Comments)    TAPE WILL TEAR THE SKIN!!!    Consultations:  None  Procedures/Studies: Dg Chest 2 View  Result Date: 05/11/2017 CLINICAL DATA:  Productive cough, shortness of breath. EXAM: CHEST  2 VIEW  COMPARISON:  Radiograph of February 15, 2017. FINDINGS: Stable cardiomediastinal silhouette. Status post coronary artery bypass graft. No pneumothorax is noted. Stable elevated left hemidiaphragm. No significant pleural effusion is noted. Mild bibasilar subsegmental atelectasis is noted. Bony thorax is unremarkable. IMPRESSION: Mild bibasilar subsegmental atelectasis. Electronically Signed   By: Marijo Conception, M.D.   On: 05/11/2017 16:24    Subjective: Feels much better, walking with much less assistance. Wants to go home. Coughing much more with flutter valve, denies dyspnea, orthopnea, leg swelling.  Discharge Exam: Vitals:   05/15/17 0457 05/15/17 1400  BP: (!) 136/57 (!) 159/60  Pulse: 65 60  Resp: 16 18  Temp: 97.9 F (36.6 C) 98.5 F (36.9 C)  SpO2: 97% 99%   General: Pt is alert, awake, not in acute distress Cardiovascular: RRR, S1/S2 +, no rubs, no gallops Respiratory: CTA bilaterally, no wheezing, no rhonchi Abdominal: Soft, NT, ND, bowel sounds + Extremities: No edema, no cyanosis  Labs: BNP (last 3 results) Recent Labs    02/16/17 0458  BNP 102.5*   Basic Metabolic  Panel: Recent Labs  Lab 05/11/17 1553 05/12/17 0300 05/13/17 0351  NA 138 141 137  K 3.5 3.4* 3.5  CL 103 110 105  CO2 24 20* 21*  GLUCOSE 179* 102* 132*  BUN 17 16 15   CREATININE 1.25* 1.07 1.23  CALCIUM 9.0 8.3* 8.1*  MG  --   --  1.9   Liver Function Tests: Recent Labs  Lab 05/10/17 0741 05/11/17 1553  AST 23 30  ALT 19 20  ALKPHOS 85 71  BILITOT 0.8 0.7  PROT 6.5 6.5  ALBUMIN 4.3 3.8   No results for input(s): LIPASE, AMYLASE in the last 168 hours. No results for input(s): AMMONIA in the last 168 hours. CBC: Recent Labs  Lab 05/11/17 1553 05/12/17 0300 05/13/17 0351  WBC 5.2 6.2 8.3  NEUTROABS 3.5  --   --   HGB 13.8 12.8* 13.0  HCT 42.1 40.0 40.2  MCV 94.2 94.6 94.6  PLT 116* 98* 95*   Cardiac Enzymes: Recent Labs  Lab 05/11/17 2146 05/12/17 0300  TROPONINI  <0.03 <0.03   BNP: Invalid input(s): POCBNP CBG: No results for input(s): GLUCAP in the last 168 hours. D-Dimer No results for input(s): DDIMER in the last 72 hours. Hgb A1c No results for input(s): HGBA1C in the last 72 hours. Lipid Profile No results for input(s): CHOL, HDL, LDLCALC, TRIG, CHOLHDL, LDLDIRECT in the last 72 hours. Thyroid function studies No results for input(s): TSH, T4TOTAL, T3FREE, THYROIDAB in the last 72 hours.  Invalid input(s): FREET3 Anemia work up No results for input(s): VITAMINB12, FOLATE, FERRITIN, TIBC, IRON, RETICCTPCT in the last 72 hours. Urinalysis    Component Value Date/Time   COLORURINE YELLOW 05/11/2017 1632   APPEARANCEUR CLOUDY (A) 05/11/2017 1632   LABSPEC 1.017 05/11/2017 1632   PHURINE 6.0 05/11/2017 1632   GLUCOSEU NEGATIVE 05/11/2017 1632   HGBUR LARGE (A) 05/11/2017 1632   BILIRUBINUR NEGATIVE 05/11/2017 1632   BILIRUBINUR neg 06/30/2014 1530   KETONESUR NEGATIVE 05/11/2017 1632   PROTEINUR 100 (A) 05/11/2017 1632   UROBILINOGEN 0.2 12/04/2014 2344   NITRITE NEGATIVE 05/11/2017 1632   LEUKOCYTESUR MODERATE (A) 05/11/2017 1632    Microbiology Recent Results (from the past 240 hour(s))  Blood culture (routine x 2)     Status: None (Preliminary result)   Collection Time: 05/11/17  7:30 PM  Result Value Ref Range Status   Specimen Description BLOOD LEFT WRIST  Final   Special Requests   Final    BOTTLES DRAWN AEROBIC AND ANAEROBIC Blood Culture adequate volume   Culture   Final    NO GROWTH 4 DAYS Performed at [facility], [place] [address], [place], [place] [address]    Report Status PENDING  Incomplete  Blood culture (routine x 2)     Status: None (Preliminary result)   Collection Time: 05/11/17  7:35 PM  Result Value Ref Range Status   Specimen Description BLOOD RIGHT WRIST  Final   Special Requests   Final    BOTTLES DRAWN AEROBIC AND ANAEROBIC Blood Culture adequate volume   Culture   Final    NO GROWTH 4  DAYS Performed at [facility], [place] [address], [place],  [address]    Report Status PENDING  Incomplete  Urine culture     Status: Abnormal   Collection Time: 05/11/17  8:12 PM  Result Value Ref Range Status   Specimen Description URINE, CATHETERIZED  Final   Special Requests   Final    NONE  Performed at [facility], [place] [address], [place], [place] [address]    Culture 60,000 COLONIES/mL STAPHYLOCOCCUS AUREUS (A)  Final   Report Status 05/14/2017 FINAL  Final   Organism ID, Bacteria STAPHYLOCOCCUS AUREUS (A)  Final      Susceptibility   Staphylococcus aureus - MIC*    CIPROFLOXACIN >=8 RESISTANT Resistant     GENTAMICIN <=0.5 SENSITIVE Sensitive     NITROFURANTOIN <=16 SENSITIVE Sensitive     OXACILLIN 0.5 SENSITIVE Sensitive     TETRACYCLINE <=1 SENSITIVE Sensitive     VANCOMYCIN <=0.5 SENSITIVE Sensitive     TRIMETH/SULFA <=10 SENSITIVE Sensitive     CLINDAMYCIN <=0.25 SENSITIVE Sensitive     RIFAMPIN <=0.5 SENSITIVE Sensitive     Inducible Clindamycin NEGATIVE Sensitive     * 60,000 COLONIES/mL STAPHYLOCOCCUS AUREUS  Respiratory Panel by PCR     Status: None   Collection Time: 05/11/17  8:39 PM  Result Value Ref Range Status   Adenovirus NOT DETECTED NOT DETECTED Final   Coronavirus 229E NOT DETECTED NOT DETECTED Final   Coronavirus HKU1 NOT DETECTED NOT DETECTED Final   Coronavirus NL63 NOT DETECTED NOT DETECTED Final   Coronavirus OC43 NOT DETECTED NOT DETECTED Final   Metapneumovirus NOT DETECTED NOT DETECTED Final   Rhinovirus / Enterovirus NOT DETECTED NOT DETECTED Final   Influenza A NOT DETECTED NOT DETECTED Final   Influenza B NOT DETECTED NOT DETECTED Final   Parainfluenza Virus 1 NOT DETECTED NOT DETECTED Final   Parainfluenza Virus 2 NOT DETECTED NOT DETECTED Final   Parainfluenza Virus 3 NOT DETECTED NOT DETECTED Final   Parainfluenza Virus 4 NOT DETECTED NOT DETECTED Final   Respiratory Syncytial Virus NOT DETECTED NOT DETECTED Final    Bordetella pertussis NOT DETECTED NOT DETECTED Final   Chlamydophila pneumoniae NOT DETECTED NOT DETECTED Final   Mycoplasma pneumoniae NOT DETECTED NOT DETECTED Final    Comment: Performed at [facility], [place]. [address], [place], [place] [address]  Culture, sputum-assessment     Status: None   Collection Time: 05/13/17 10:24 AM  Result Value Ref Range Status   Specimen Description SPUTUM  Final   Special Requests NONE  Final   Sputum evaluation   Final    THIS SPECIMEN IS ACCEPTABLE FOR SPUTUM CULTURE Performed at [facility], [address] [place], [place] [address]    Report Status 05/13/2017 FINAL  Final  Culture, respiratory (NON-Expectorated)     Status: None (Preliminary result)   Collection Time: 05/13/17 10:24 AM  Result Value Ref Range Status   Specimen Description SPUTUM  Final   Special Requests NONE Reflexed from [id number]  Final   Gram Stain   Final    ABUNDANT WBC PRESENT,BOTH PMN AND MONONUCLEAR FEW SQUAMOUS EPITHELIAL CELLS PRESENT FEW GRAM POSITIVE COCCI IN PAIRS FEW GRAM NEGATIVE RODS FEW GRAM POSITIVE RODS    Culture   Final    CULTURE REINCUBATED FOR BETTER GROWTH Performed at [facility], [place] [address], [place], [place] [address]    Report Status PENDING  Incomplete    Time coordinating discharge: Approximately 40 minutes  Vance Gather, MD  [facility] 05/15/2017, 2:46 PM Pager [phone]

## 2017-05-15 NOTE — Social Work (Signed)
CSW aware that pt is going home with home health.   CSW signing off. Please consult if any additional needs arise.  [place], [place] [facility] [phone]

## 2017-05-15 NOTE — Care Management Note (Signed)
Case Management Note  Patient Details  Name: Ronald [name] MRN: [id number] Date of Birth: 05-Nov-1927  Subjective/Objective:                    Action/Plan:  PT recommending home health PT. Spoke to patient at bedside. Patient does not want HHPT at this time. Patient stated he has to have cancer removed from his face and after that he will consider HHPT. Patient states he has walker and cane at home already. Explained 3 in 1 to patient patient also declined 3 in 1.   Left message for his daughter Uvaldo Bristle [phone] Expected Discharge Date:  05/15/17               Expected Discharge Plan:  Home/Self Care  In-House Referral:     Discharge planning Services  CM Consult  Post Acute Care Choice:  Home Health Choice offered to:  Patient  DME Arranged:  N/A DME Agency:  NA  HH Arranged:  Patient Refused [place] Agency:  NA  Status of Service:  Completed, signed off  If discussed at [place] of Stay Meetings, dates discussed:    Additional Comments:  Marilu Favre, RN 05/15/2017, 3:07 PM

## 2017-05-15 NOTE — Progress Notes (Signed)
Physical Therapy Treatment Patient Details Name: Ronald [name] MRN: [id number] DOB: 1927/07/03 Today's Date: 05/15/2017    History of Present Illness Ronald [name] "Ronald [name] is an 84yo white male who comes to [facility] on 2/14 with midl cough, sputum, and fever, admittied for sepsis related to FluA. PMH: chronic suprapubic catheter s/p Prostate CA, HTN, CAD s/p CABG s/p stents, bells palsy, PAF, mild dementia, HOH.     PT Comments    Pt progressing well with mobility. Reports he feels "much better but not back to normal". Pt was able to demonstrate transfers and ambulation with gross supervision to min guard assist for safety. Min assist provided on the stairs due to posterior LOB. No family present during session however asked nurse to educate daughter about safety on stairs when she comes to pick him up. Apparently, wife now also has the flu, so pt will need to be able to mobilize around the house without assistance. With RW, feel he will be able to do so, but will need assist for stair negotiation. Will continue to follow and progress as able per POC.    Follow Up Recommendations  Home health PT;Supervision/Assistance - 24 hour     Equipment Recommendations  None recommended by PT    Recommendations for Other Services       Precautions / Restrictions Precautions Precautions: Fall Restrictions Weight Bearing Restrictions: No    Mobility  Bed Mobility Overal bed mobility: Needs Assistance Bed Mobility: Sit to Supine       Sit to supine: Supervision   General bed mobility comments: Pt was able to transition to supine and get himself positioned without assistance  Transfers Overall transfer level: Needs assistance Equipment used: Rolling walker (2 wheeled) Transfers: Sit to/from Stand Sit to Stand: Supervision         General transfer comment: Pt demonstrated proper hand placement on seated surface for safety. Slightly unsteady appearing however no assist  required  Ambulation/Gait Ambulation/Gait assistance: Min guard Ambulation Distance (Feet): 250 Feet Assistive device: Rolling walker (2 wheeled) Gait Pattern/deviations: Step-through pattern;Decreased stride length;Trunk flexed Gait velocity: Decreased Gait velocity interpretation: Below normal speed for age/gender General Gait Details: VC's for closer walker proximity. Pt was able to tolerate increased ambulation distance without difficulty or rest breaks   Stairs Stairs: Yes   Stair Management: One rail Right;Alternating pattern;Forwards Number of Stairs: 3(x2 trials) General stair comments: Initially pt with significant posterior LOB requiring a heavy min assist to recover. Pt was instructed in step-to pattern for safety, and pt attempted stairs again. No LOB noted on second attempt however pt not consistent with maintaining step-to pattern  Wheelchair Mobility    Modified Rankin (Stroke Patients Only)       Balance Overall balance assessment: Needs assistance Sitting-balance support: Feet supported;No upper extremity supported Sitting balance-Leahy Scale: Fair     Standing balance support: Bilateral upper extremity supported;During functional activity Standing balance-Leahy Scale: Poor Standing balance comment: Reliance on RW for support                            Cognition Arousal/Alertness: Awake/alert Behavior During Therapy: Impulsive Overall Cognitive Status: No family/caregiver present to determine baseline cognitive functioning                                 General Comments: Attempting to use humor throughout session instead of answering some questions  Exercises      General Comments        Pertinent Vitals/Pain Pain Assessment: No/denies pain    Home Living                      Prior Function            PT Goals (current goals can now be found in the care plan section) Acute Rehab PT Goals Patient  Stated Goal: regain strength and independent household distance ambulation PT Goal Formulation: With patient Time For Goal Achievement: 05/26/17 Potential to Achieve Goals: Fair Progress towards PT goals: Progressing toward goals    Frequency    Min 3X/week      PT Plan Discharge plan needs to be updated    Co-evaluation              AM-PAC PT "6 Clicks" Daily Activity  Outcome Measure  Difficulty turning over in bed (including adjusting bedclothes, sheets and blankets)?: None Difficulty moving from lying on back to sitting on the side of the bed? : A Little Difficulty sitting down on and standing up from a chair with arms (e.g., wheelchair, bedside commode, etc,.)?: A Little Help needed moving to and from a bed to chair (including a wheelchair)?: A Little Help needed walking in hospital room?: A Little Help needed climbing 3-5 steps with a railing? : A Little 6 Click Score: 19    End of Session Equipment Utilized During Treatment: Gait belt Activity Tolerance: Patient limited by fatigue Patient left: in bed;with bed alarm set;with call bell/phone within reach Nurse Communication: Mobility status PT Visit Diagnosis: Unsteadiness on feet (R26.81);Difficulty in walking, not elsewhere classified (R26.2)     Time: 1410-1445 PT Time Calculation (min) (ACUTE ONLY): 35 min  Charges:  $Gait Training: 23-37 mins                    G Codes:       Rolinda Roan, PT, DPT Acute Rehabilitation Services Pager: [phone]    Thelma Comp 05/15/2017, 2:59 PM

## 2017-05-16 ENCOUNTER — Telehealth: Payer: Self-pay | Admitting: Family Medicine

## 2017-05-16 LAB — CULTURE, BLOOD (ROUTINE X 2)
Culture: NO GROWTH
Culture: NO GROWTH
Special Requests: ADEQUATE
Special Requests: ADEQUATE

## 2017-05-16 LAB — CULTURE, RESPIRATORY W GRAM STAIN: Culture: NORMAL

## 2017-05-16 NOTE — Telephone Encounter (Signed)
Transition Care Management Follow-up Telephone Call  Ronald [name] WPV:[id number] DOB: 08-16-1927 DOA: 05/11/2017  PCP: Marletta Lor, MD  Admit date: 05/11/2017 Discharge date: 05/15/2017  Admitted From: Home Disposition: Home   Recommendations for Outpatient Follow-up:  1. Follow up with PCP in 1-2 weeks 2. Please obtain BMP/CBC in one week  Home Health: PT Equipment/Devices: None new Discharge Condition: Stable CODE STATUS: Full Diet recommendation: Heart healthy  Brief/Interim Summary: Ronald [name] ZS84 y.o.malewith a history of CAD s/p CABG 1989 and redo 1997, ischemic cardiomyopathy, AFib on eliquis, HTN, HLD, mild dementia, prostate CA s/p radiation seeds with chronic suprapubic foley catheter who presented to the ED 2/14 with fever, sore throat, cough and generalized weakness/body aches the afternoon after suprapubic catheter exchange at [facility] urology. He was febrile on arrival, diffusely weak and has positive swab for influenza A. Tamiflu was started with improvement. He was initially going to need SNF placement for rehabilitation, though his weakness has improved to the point the he may be discharged to home with home health assistance    How have you been since you were released from the hospital? "weak"   Do you understand why you were in the hospital? yes   Do you understand the discharge instructions? yes   Where were you discharged to? Home  Items Reviewed:  Medications reviewed: yes  Allergies reviewed: yes  Dietary changes reviewed: yes  Referrals reviewed: yes   Functional Questionnaire:   Activities of Daily Living (ADLs):   He states they are independent in the following: ambulation, bathing and hygiene, feeding, continence, grooming, toileting and dressing States they require assistance with the following: none   Any transportation issues/concerns?: no   Any patient concerns? no   Confirmed importance and date/time  of follow-up visits scheduled yes  Provider Appointment booked with Dr. Burnice Logan on 05/24/2017 Wednesday at 11:15 am  Confirmed with patient if condition begins to worsen call PCP or go to the ER.  Patient was given the office number and encouraged to call back with question or concerns.  : yes

## 2017-05-24 ENCOUNTER — Encounter: Payer: Self-pay | Admitting: Internal Medicine

## 2017-05-24 ENCOUNTER — Ambulatory Visit (INDEPENDENT_AMBULATORY_CARE_PROVIDER_SITE_OTHER): Payer: Medicare Other | Admitting: Internal Medicine

## 2017-05-24 VITALS — BP 130/70 | HR 61 | Temp 97.8°F | Wt 158.0 lb

## 2017-05-24 DIAGNOSIS — N390 Urinary tract infection, site not specified: Secondary | ICD-10-CM

## 2017-05-24 DIAGNOSIS — R5381 Other malaise: Secondary | ICD-10-CM

## 2017-05-24 DIAGNOSIS — J101 Influenza due to other identified influenza virus with other respiratory manifestations: Secondary | ICD-10-CM

## 2017-05-24 DIAGNOSIS — I10 Essential (primary) hypertension: Secondary | ICD-10-CM | POA: Diagnosis not present

## 2017-05-24 DIAGNOSIS — I5022 Chronic systolic (congestive) heart failure: Secondary | ICD-10-CM | POA: Diagnosis not present

## 2017-05-24 MED ORDER — ATORVASTATIN CALCIUM 80 MG PO TABS: 40.0000 mg | ORAL_TABLET | Freq: Every evening | ORAL | Status: AC

## 2017-05-24 NOTE — Patient Instructions (Signed)
Limit your sodium (Salt) intake  Home physical therapy as discussed  Report any new or worsening symptoms such as fever or weakness  Return in 3 months for follow-up

## 2017-05-24 NOTE — Progress Notes (Signed)
Subjective:    Patient ID: Ronald [name], male    DOB: 14-Apr-1927, 84 y.o.   MRN: [id number]  HPI 84 year old patient who is seen today following a recent hospital discharge 9 days ago and for transitional care management.  Admit date: 05/11/2017 Discharge date: 05/15/2017  Brief/Interim Summary: Ronald [name] AO15 y.o.malewith a history of CAD s/p CABG 1989 and redo 1997, ischemic cardiomyopathy, AFib on eliquis, HTN, HLD, mild dementia, prostate CA s/p radiation seeds with chronic suprapubic foley catheter who presented to the ED 2/14 with fever, sore throat, cough and generalized weakness/body aches the afternoon after suprapubic catheter exchange at [facility] urology. He was febrile on arrival, diffusely weak and has positive swab for influenza A. Tamiflu was started with improvement. He was initially going to need SNF placement for rehabilitation, though his weakness has improved to the point the he may be discharged to home with home health assistance.  Discharge Diagnoses:  Principal Problem:   Influenza A Active Problems:   HTN (hypertension)   Urinary tract infection associated with cystostomy catheter ([place])   Coronary artery disease involving coronary bypass graft of native heart without angina pectoris   Suprapubic catheter ([place])  Since his discharge he has done fairly well but remains quite weak.  He has completed antibiotic therapy as well as Tamiflu He is accompanied by his wife today.  He expected home physical therapy to be initiated post hospital stay this has not been ordered. He does have a history of skin cancer involving the left facial area and is scheduled for Mohs surgery next week   Past Medical History:  Diagnosis Date  . Arthritis    "joints ache" (01/02/2014)  . At risk for sleep apnea    STOP-BANG= 4    SENT TO PCP 12-23-2013  . Bladder calculi   . CHF (congestive heart failure) ([place])   . Chronic cystitis   . Coronary artery disease  CARDIOLOGIST-  DR Grace Bushy  MI -- S/P  11/89 CABG x6 (70% circ; 90% PD; 60-70% distal left main; 30% left circ; 90% 1st diag;, 2nd diag and 3rd diag. w/90%,  mild stenosis LAD and 60-70% pLAD)  Re-do CABG x5 in 1997  . Diverticulosis   . GERD (gastroesophageal reflux disease)   . History of Bell's palsy    RIGHT SIDE-- NO RESIDUAL  . Hx of dizziness   . Hyperlipidemia   . Hypertension    "not anymore" (01/02/2014)  . Ischemic cardiomyopathy    ef 35-40% per cath 08-28-2013  . Kidney stones "years ago"   "passed them"  . Melanoma of ear ([place])    "right"  . Mild dementia   . Myocardial infarction ([place]) 1986; 1997  . Nocturia   . OAB (overactive bladder)   . Prostate cancer ([place]) 1998   S/P  [place]; [place]  . S/P CABG (coronary artery bypass graft)    [place]  . Skipped heart beats    occasional  . Urethral stricture   . UTI (urinary tract infection) 09/2015  . Wears hearing aid    bilateral  . Wears partial dentures      Social History   Socioeconomic History  . Marital status: Married    Spouse name: Not on file  . Number of children: Not on file  . Years of education: Not on file  . Highest education level: Not on file  Social Needs  .  Financial resource strain: Not on file  . Food insecurity - worry: Not on file  . Food insecurity - inability: Not on file  . Transportation needs - medical: Not on file  . Transportation needs - non-medical: Not on file  Occupational History  . Not on file  Tobacco Use  . Smoking status: Former Smoker    Years: 40.00    Types: Cigars    Last attempt to quit: 12/24/1983    Years since quitting: 33.4  . Smokeless tobacco: Former Systems developer    Types: Chew    Quit date: 08/28/2013  . Tobacco comment: smoked a pipe  Substance and Sexual Activity  . Alcohol use: No  . Drug use: No  . Sexual activity: Not on file  Other Topics Concern  . Not on file  Social History Narrative   Was a Psychologist, clinical   Was in the [employer] x 4 years   Father died and felt he needed to come home to help his mother    Was asked to rejoin      2 children; boy and girl   3 grand children   One grandson in [place], one grandson in [place];    [place] a job as Nurse, adult dtr; [place].     Past Surgical History:  Procedure Laterality Date  . CARDIAC CATHETERIZATION  02-22-2005  dr Vidal Schwalbe   mild to moderate lv dysfunction with inferobasilar akinesis/  totally occluded SVG to Intermediate Diagonal and SVG to PDA and RCA branches, totally occluded native coronary circulation with diffuse disease pLAD diagonal system with potentially could be ischemic/  patent SVG to OM with collaterals to dRCA and patent LIMA to LAD and diagonal systemss  . CARDIAC CATHETERIZATION  08-28-2013  DR Daneen Schick   widely patent sequential left internal graft to the diagonal/LAD, widely patent SVG to OM with proximal 50% narrowing noted in the graft, total occlusion SVG's to RCA, RI, and the Diagonal/ LV dysfunction with inferobasal aneurysm and mid anterior wall region of akinesis/  overall EF 35-40%/  Total occlusion of the navtive circulation/  no significant change compared to 2006 cath  . CARDIOVASCULAR STRESS TEST  08-01-2011  dr Angelena Form   inferior scar and possible soft tissue attenuation with minimal peri-infarct ischemia, small region of anterior ischemia and scar/  LVEF 53% LV wall motion with inferior hypokinesis/ no significant change from scan july 2011  . CATARACT EXTRACTION W/ INTRAOCULAR LENS  IMPLANT, BILATERAL Bilateral 2007  . CORONARY ARTERY BYPASS GRAFT  11/ [place]-- 6 vessel/  1997 Re-do 5 vessel  . CYSTOSCOPY WITH URETHRAL DILATATION N/A 12/25/2013   Procedure: CYSTOSCOPY WITH URETHRAL DILATATION, WITH BIOPSY;  Surgeon: Bernestine Amass, MD;  Location: [facility];  Service: Urology;  Laterality: N/A;  . CYSTOSCOPY WITH URETHRAL DILATATION N/A 12/22/2014    Procedure: CYSTOSCOPY WITH URETHRAL DILATATION;  Surgeon: Rana Snare, MD;  Location: [facility];  Service: Urology;  Laterality: N/A;  BALLOON DILATION CATHETER    . EUS  10/05/2011   Procedure: ESOPHAGEAL ENDOSCOPIC ULTRASOUND (EUS) RADIAL;  Surgeon: Arta Silence, MD;  Location: [facility];  Service: Endoscopy;  Laterality: N/A;  . INSERTION OF SUPRAPUBIC CATHETER N/A 12/22/2014   Procedure: INSERTION OF SUPRAPUBIC CATHETER ;  Surgeon: Rana Snare, MD;  Location: [facility];  Service: Urology;  Laterality: N/A;  . LAPAROSCOPIC CHOLECYSTECTOMY  12-23-2005  . LEFT HEART CATHETERIZATION WITH CORONARY/GRAFT  ANGIOGRAM N/A 08/28/2013   Procedure: LEFT HEART CATHETERIZATION WITH Beatrix Fetters;  Surgeon: Sinclair Grooms, MD;  Location: [facility];  Service: Cardiovascular;  Laterality: N/A;  . MELANOMA EXCISION  X 1   "ear"  . RADIOACTIVE PROSTATE SEED IMPLANTS  1998  . SKIN CANCER EXCISION  X 2   "top of head"  . TONSILLECTOMY AND ADENOIDECTOMY  1954    Family History  Problem Relation Age of Onset  . Heart disease Mother   . Diabetes Father   . Heart disease Brother     Allergies  Allergen Reactions  . Pantoprazole Other (See Comments)    Headache and lightheaded  . Nitrofurantoin Hives  . Sulfa Antibiotics Hives and Itching  . Lidocaine Swelling    Mouth swelling   . Tape Other (See Comments)    TAPE WILL TEAR THE SKIN!!!    Current Outpatient Medications on File Prior to Visit  Medication Sig Dispense Refill  . acetaminophen (TYLENOL) 500 MG tablet Take 1,000 mg by mouth every 6 (six) hours as needed for pain or fever.     Marland Kitchen apixaban (ELIQUIS) 5 MG TABS tablet Take 1 tablet (5 mg total) by mouth 2 (two) times daily. 60 tablet 11  . Calcium Carbonate Antacid (TUMS PO) Take 3 tablets by mouth daily as needed (gas).    . furosemide (LASIX) 20 MG tablet Take 1 tablet (20 mg total) by mouth daily as needed. For swelling (Patient taking differently: Take 20 mg by mouth  daily as needed (for swelling). ) 15 tablet 6  . galantamine (RAZADYNE ER) 8 MG 24 hr capsule Take 8 mg by mouth daily with breakfast.    . hydrocortisone (ANUSOL-HC) 2.5 % rectal cream Place 1 application rectally 2 (two) times daily. 30 g 0  . hydroxypropyl methylcellulose (ISOPTO TEARS) 2.5 % ophthalmic solution Place 1 drop into both eyes 2 (two) times daily.     . Multiple Vitamin (MULTIVITAMIN WITH MINERALS) TABS Take 1 tablet by mouth daily.    . nitroGLYCERIN (NITROSTAT) 0.4 MG SL tablet Place 0.4 mg under the tongue every 5 (five) minutes as needed for chest pain.     Marland Kitchen oseltamivir (TAMIFLU) 30 MG capsule Take 1 capsule (30 mg total) by mouth 2 (two) times daily. 3 capsule 0  . ranitidine (ZANTAC) 150 MG tablet Take 1 tablet (150 mg total) by mouth 2 (two) times daily.     No current facility-administered medications on file prior to visit.     BP 130/70 (BP Location: Right Arm, Patient Position: Sitting, Cuff Size: Normal)   Pulse 61   Temp 97.8 F (36.6 C) (Oral)   Wt 158 lb (71.7 kg)   SpO2 97%   BMI 24.75 kg/m      Review of Systems  Constitutional: Positive for fatigue. Negative for appetite change, chills and fever.  HENT: Negative for congestion, dental problem, ear pain, hearing loss, sore throat, tinnitus, trouble swallowing and voice change.   Eyes: Negative for pain, discharge and visual disturbance.  Respiratory: Negative for cough, chest tightness, wheezing and stridor.   Cardiovascular: Negative for chest pain, palpitations and leg swelling.  Gastrointestinal: Negative for abdominal distention, abdominal pain, blood in stool, constipation, diarrhea, nausea and vomiting.  Genitourinary: Negative for difficulty urinating, discharge, flank pain, genital sores, hematuria and urgency.  Musculoskeletal: Negative for arthralgias, back pain, gait problem, joint swelling, myalgias and neck stiffness.  Skin: Negative for rash.  Neurological: Positive for weakness.  Negative for  dizziness, syncope, speech difficulty, numbness and headaches.  Hematological: Negative for adenopathy. Does not bruise/bleed easily.  Psychiatric/Behavioral: Negative for behavioral problems and dysphoric mood. The patient is not nervous/anxious.        Objective:   Physical Exam  Constitutional: He is oriented to person, place, and time. He appears well-developed.  Blood pressure 130/70  HENT:  Head: Normocephalic.  Right Ear: External ear normal.  Left Ear: External ear normal.  Eyes: Conjunctivae and EOM are normal.  Neck: Normal range of motion.  Cardiovascular: Normal rate and normal heart sounds.  Pulmonary/Chest: Effort normal and breath sounds normal. No respiratory distress. He has no wheezes.  Abdominal: Bowel sounds are normal.  Musculoskeletal: Normal range of motion. He exhibits no edema or tenderness.  Neurological: He is alert and oriented to person, place, and time.  Skin:  3 cm raised circular crusted lesion left facial area  Psychiatric: He has a normal mood and affect. His behavior is normal.          Assessment & Plan:   Status post hospital mission for influenza A Chronic atrial fibrillation.  Continue anticoagulation Skin cancer left facial area Mohs surgery is scheduled for next week Chronic systolic heart failure compensated Dyslipidemia continue atorvastatin 40 mg daily  Cardiology follow-up as scheduled Return here in 3 months for follow-up We will set up for home physical therapy which has been quite helpful in the past  [place]

## 2017-05-29 DIAGNOSIS — C44329 Squamous cell carcinoma of skin of other parts of face: Secondary | ICD-10-CM | POA: Diagnosis not present

## 2017-06-02 ENCOUNTER — Other Ambulatory Visit: Payer: Self-pay | Admitting: *Deleted

## 2017-06-02 DIAGNOSIS — I5022 Chronic systolic (congestive) heart failure: Secondary | ICD-10-CM

## 2017-06-02 DIAGNOSIS — R5381 Other malaise: Secondary | ICD-10-CM

## 2017-06-08 ENCOUNTER — Telehealth: Payer: Self-pay | Admitting: Internal Medicine

## 2017-06-08 DIAGNOSIS — I482 Chronic atrial fibrillation: Secondary | ICD-10-CM | POA: Diagnosis not present

## 2017-06-08 DIAGNOSIS — N35112 Postinfective bulbous urethral stricture, not elsewhere classified: Secondary | ICD-10-CM | POA: Diagnosis not present

## 2017-06-08 DIAGNOSIS — F0390 Unspecified dementia without behavioral disturbance: Secondary | ICD-10-CM | POA: Diagnosis not present

## 2017-06-08 DIAGNOSIS — Z936 Other artificial openings of urinary tract status: Secondary | ICD-10-CM | POA: Diagnosis not present

## 2017-06-08 DIAGNOSIS — I110 Hypertensive heart disease with heart failure: Secondary | ICD-10-CM | POA: Diagnosis not present

## 2017-06-08 DIAGNOSIS — I5022 Chronic systolic (congestive) heart failure: Secondary | ICD-10-CM | POA: Diagnosis not present

## 2017-06-08 DIAGNOSIS — M6281 Muscle weakness (generalized): Secondary | ICD-10-CM | POA: Diagnosis not present

## 2017-06-08 NOTE — Telephone Encounter (Signed)
Noted! Routing to Dr.Kwiatkowski. 

## 2017-06-08 NOTE — Telephone Encounter (Signed)
Copied from [place] [id number]. Topic: Quick Communication - See Telephone Encounter >> Jun 08, 2017  2:27 PM Percell Belt A wrote: CRM for notification. See Telephone encounter for:  Joaquim Lai with [place] Need verbals for PT  2 week 4   06/08/17.

## 2017-06-09 NOTE — Telephone Encounter (Signed)
Spoke to Ronald [name] and gave the okay for Pt per Dr.Kwiatkowski.

## 2017-06-09 NOTE — Telephone Encounter (Signed)
Okay for PT

## 2017-06-11 ENCOUNTER — Emergency Department (HOSPITAL_COMMUNITY)
Admission: EM | Admit: 2017-06-11 | Discharge: 2017-06-11 | Disposition: A | Discharge status: Home / Self Care | Payer: Medicare Other | Attending: Emergency Medicine | Admitting: Emergency Medicine

## 2017-06-11 ENCOUNTER — Emergency Department (HOSPITAL_COMMUNITY): Payer: Medicare Other

## 2017-06-11 ENCOUNTER — Other Ambulatory Visit: Payer: Self-pay

## 2017-06-11 ENCOUNTER — Encounter (HOSPITAL_COMMUNITY): Payer: Self-pay | Admitting: Emergency Medicine

## 2017-06-11 DIAGNOSIS — Z87891 Personal history of nicotine dependence: Secondary | ICD-10-CM | POA: Insufficient documentation

## 2017-06-11 DIAGNOSIS — N182 Chronic kidney disease, stage 2 (mild): Secondary | ICD-10-CM | POA: Insufficient documentation

## 2017-06-11 DIAGNOSIS — X58XXXA Exposure to other specified factors, initial encounter: Secondary | ICD-10-CM | POA: Insufficient documentation

## 2017-06-11 DIAGNOSIS — S0011XA Contusion of right eyelid and periocular area, initial encounter: Secondary | ICD-10-CM | POA: Diagnosis not present

## 2017-06-11 DIAGNOSIS — Y929 Unspecified place or not applicable: Secondary | ICD-10-CM | POA: Diagnosis not present

## 2017-06-11 DIAGNOSIS — Y999 Unspecified external cause status: Secondary | ICD-10-CM | POA: Diagnosis not present

## 2017-06-11 DIAGNOSIS — S058X1A Other injuries of right eye and orbit, initial encounter: Secondary | ICD-10-CM | POA: Diagnosis present

## 2017-06-11 DIAGNOSIS — F0390 Unspecified dementia without behavioral disturbance: Secondary | ICD-10-CM | POA: Diagnosis not present

## 2017-06-11 DIAGNOSIS — H5789 Other specified disorders of eye and adnexa: Secondary | ICD-10-CM | POA: Diagnosis not present

## 2017-06-11 DIAGNOSIS — Y939 Activity, unspecified: Secondary | ICD-10-CM | POA: Diagnosis not present

## 2017-06-11 DIAGNOSIS — Z79899 Other long term (current) drug therapy: Secondary | ICD-10-CM | POA: Insufficient documentation

## 2017-06-11 DIAGNOSIS — I130 Hypertensive heart and chronic kidney disease with heart failure and stage 1 through stage 4 chronic kidney disease, or unspecified chronic kidney disease: Secondary | ICD-10-CM | POA: Diagnosis not present

## 2017-06-11 DIAGNOSIS — I5022 Chronic systolic (congestive) heart failure: Secondary | ICD-10-CM | POA: Diagnosis not present

## 2017-06-11 DIAGNOSIS — H1131 Conjunctival hemorrhage, right eye: Secondary | ICD-10-CM | POA: Diagnosis not present

## 2017-06-11 DIAGNOSIS — S0511XA Contusion of eyeball and orbital tissues, right eye, initial encounter: Secondary | ICD-10-CM | POA: Diagnosis not present

## 2017-06-11 DIAGNOSIS — I252 Old myocardial infarction: Secondary | ICD-10-CM | POA: Insufficient documentation

## 2017-06-11 DIAGNOSIS — I25810 Atherosclerosis of coronary artery bypass graft(s) without angina pectoris: Secondary | ICD-10-CM | POA: Diagnosis not present

## 2017-06-11 DIAGNOSIS — H579 Unspecified disorder of eye and adnexa: Secondary | ICD-10-CM

## 2017-06-11 MED ORDER — ERYTHROMYCIN 5 MG/GM OP OINT: TOPICAL_OINTMENT | OPHTHALMIC | 0 refills | Status: DC

## 2017-06-11 MED ORDER — ERYTHROMYCIN 5 MG/GM OP OINT
1.0000 "application " | TOPICAL_OINTMENT | Freq: Once | OPHTHALMIC | Status: AC
  Administered 2017-06-11: 1 via OPHTHALMIC
  Filled 2017-06-11: qty 3.5

## 2017-06-11 NOTE — ED Provider Notes (Signed)
Patient placed in Quick Look pathway, seen and evaluated   Chief Complaint: Eye injury  HPI:   Patient presents after injury to the right eye 2 days ago.  He states that he actually smacked himself in the eye with his nail.  The past 2 days he has had worsening conjunctival hemorrhage of the right eye, but he noticed that he developed a black eye and has pain behind the eye with movement of the eye.  He denies changes in vision.  He is on Eliquis.  ROS: Positive for eye pain negative for visual changes (one)  Physical Exam:   Gen: No distress  Neuro: Awake and Alert  Skin: Warm    Focused Exam:  R eye with impressive subconjunctival hemorrhage.  Ecchymosis on the lateral aspect of the right eye, pain behind the eye with movement  Spoke with radiology who recommended noncontrast CT for trauma to the eye.  It was to rule out retro-orbital hematoma  Initiation of care has begun. The patient has been counseled on the process, plan, and necessity for staying for the completion/evaluation, and the remainder of the medical screening examination    Margarita Mail, Hershal Coria 06/11/17 1850    Virgel Manifold, MD 06/11/17 2159

## 2017-06-11 NOTE — ED Triage Notes (Signed)
Pt states he stuck his finger in his R eye accidentally on Friday.  C/o bleeding around eye and bruising under eye.  Pt takes Eliquis.

## 2017-06-11 NOTE — Discharge Instructions (Signed)
Contact a health care provider if: You have pain in your eye. The bleeding does not go away within 3 weeks. You keep getting new subconjunctival hemorrhages. Get help right away if: Your vision changes or you have difficulty seeing. You suddenly develop severe sensitivity to light. You develop a severe headache, persistent vomiting, confusion, or abnormal tiredness (lethargy). Your eye seems to bulge or protrude from your eye socket. You develop unexplained bruises on your body. You have unexplained bleeding in another area of your body.

## 2017-06-11 NOTE — ED Notes (Signed)
ED Provider at bedside. 

## 2017-06-11 NOTE — ED Notes (Signed)
Pt verbalizes understanding of d/c instructions. Pt received prescriptions. Pt taken to lobby in wheelchair at d/c with all belongings.   

## 2017-06-11 NOTE — ED Notes (Signed)
[name], PA in to assess pt at triage.

## 2017-06-11 NOTE — ED Provider Notes (Signed)
[facility]ERG[facility] DEPARTMENT Provider Note   CSN: [id number] Arrival date & time: 06/11/17  1754     History   Chief Complaint Chief Complaint  Patient presents with  . Eye Injury    HPI Ronald [name] is a 84 y.o. male seen by myself in quick look.  As per my evaluation the patient stuck his finger in his right eye 2 days ago with his fingernail.  He has had progressively worsening subconjunctival hemorrhage of the right eye with last mattering and bruising.  Pain with eye movement.  No visual changes.  He takes oral Eliquis.  He denies photophobia, phonophobia or headaches.  HPI  Past Medical History:  Diagnosis Date  . Arthritis    "joints ache" (01/02/2014)  . At risk for sleep apnea    STOP-BANG= 4    SENT TO PCP 12-23-2013  . Bladder calculi   . CHF (congestive heart failure) ([place])   . Chronic cystitis   . Coronary artery disease CARDIOLOGIST-  DR Grace Bushy  MI -- S/P  11/89 CABG x6 (70% circ; 90% PD; 60-70% distal left main; 30% left circ; 90% 1st diag;, 2nd diag and 3rd diag. w/90%,  mild stenosis LAD and 60-70% pLAD)  Re-do CABG x5 in 1997  . Diverticulosis   . GERD (gastroesophageal reflux disease)   . History of Bell's palsy    RIGHT SIDE-- NO RESIDUAL  . Hx of dizziness   . Hyperlipidemia   . Hypertension    "not anymore" (01/02/2014)  . Ischemic cardiomyopathy    ef 35-40% per cath 08-28-2013  . Kidney stones "years ago"   "passed them"  . Melanoma of ear ([place])    "right"  . Mild dementia   . Myocardial infarction ([place]) 1986; 1997  . Nocturia   . OAB (overactive bladder)   . Prostate cancer ([place]) 1998   S/P  [place]; [place]  . S/P CABG (coronary artery bypass graft)    [place]  . Skipped heart beats    occasional  . Urethral stricture   . UTI (urinary tract infection) 09/2015  . Wears hearing aid    bilateral  . Wears partial dentures     Patient Active Problem List   Diagnosis Date Noted  . Fever 05/11/2017  . AKI (acute kidney injury) ([place])   . Stage 2 chronic kidney disease   . Bacteremia due to Pseudomonas 01/21/2016  . Debility 01/21/2016  . History of Bell's palsy   . Gastroesophageal reflux disease without esophagitis   . Coronary artery disease involving coronary bypass graft of native heart without angina pectoris   . History of prostate cancer   . Suprapubic catheter ([place])   . Incontinence of feces   . Renal failure syndrome   . Bacteremia   . NSTEMI (non-ST elevated myocardial infarction) ([place]) 01/16/2016  . Septic shock ([place])   . ARF (acute renal failure) ([place]) 10/13/2015  . Influenza A 12/05/2014  . Urinary tract infection associated with cystostomy catheter ([place]) 10/03/2014  . Acute lower UTI 09/10/2014  . Bacteremia due to Enterococcus 01/22/2014  . Chronic systolic CHF (congestive heart failure) ([place]) 06-Jul-202015  . Urethral stricture 12/25/2013  . Bladder calculus 12/25/2013  . LV dysfunction 09/23/2013  . Unstable angina ([place]) 08/27/2013  . Bacteremia due to Escherichia coli 05/20/2012  . Urinary tract infection 09/15/2011  . Calculus of kidney 09/12/2011  . HYPERCHOLESTEROLEMIA  10/18/2007  . HTN (hypertension) 10/18/2007  . OSTEOARTHRITIS 02/07/2007  . BELL'S PALSY, RIGHT 10/26/2006  . Coronary atherosclerosis 10/26/2006  . PROSTATE CANCER, HX OF 10/26/2006  . NEPHROLITHIASIS, HX OF 10/26/2006    Past Surgical History:  Procedure Laterality Date  . CARDIAC CATHETERIZATION  02-22-2005  dr Vidal Schwalbe   mild to moderate lv dysfunction with inferobasilar akinesis/  totally occluded SVG to Intermediate Diagonal and SVG to PDA and RCA branches, totally occluded native coronary circulation with diffuse disease pLAD diagonal system with potentially could be ischemic/  patent SVG to OM with collaterals to dRCA and patent LIMA to LAD and diagonal systemss  . CARDIAC CATHETERIZATION  08-28-2013  DR Daneen Schick   widely patent  sequential left internal graft to the diagonal/LAD, widely patent SVG to OM with proximal 50% narrowing noted in the graft, total occlusion SVG's to RCA, RI, and the Diagonal/ LV dysfunction with inferobasal aneurysm and mid anterior wall region of akinesis/  overall EF 35-40%/  Total occlusion of the navtive circulation/  no significant change compared to 2006 cath  . CARDIOVASCULAR STRESS TEST  08-01-2011  dr Angelena Form   inferior scar and possible soft tissue attenuation with minimal peri-infarct ischemia, small region of anterior ischemia and scar/  LVEF 53% LV wall motion with inferior hypokinesis/ no significant change from scan july 2011  . CATARACT EXTRACTION W/ INTRAOCULAR LENS  IMPLANT, BILATERAL Bilateral 2007  . CORONARY ARTERY BYPASS GRAFT  11/ [place]-- 6 vessel/  1997 Re-do 5 vessel  . CYSTOSCOPY WITH URETHRAL DILATATION N/A 12/25/2013   Procedure: CYSTOSCOPY WITH URETHRAL DILATATION, WITH BIOPSY;  Surgeon: Bernestine Amass, MD;  [facility];  Service: Urology;  Laterality: N/A;  . CYSTOSCOPY WITH URETHRAL DILATATION N/A 12/22/2014   Procedure: CYSTOSCOPY WITH URETHRAL DILATATION;  Surgeon: Rana Snare, MD;  Location: [facility];  Service: Urology;  Laterality: N/A;  BALLOON DILATION CATHETER    . EUS  10/05/2011   Procedure: ESOPHAGEAL ENDOSCOPIC ULTRASOUND (EUS) RADIAL;  Surgeon: Arta Silence, MD;  Location: [facility];  Service: Endoscopy;  Laterality: N/A;  . INSERTION OF SUPRAPUBIC CATHETER N/A 12/22/2014   Procedure: INSERTION OF SUPRAPUBIC CATHETER ;  Surgeon: Rana Snare, MD;  Location: [facility];  Service: Urology;  Laterality: N/A;  . LAPAROSCOPIC CHOLECYSTECTOMY  12-23-2005  . LEFT HEART CATHETERIZATION WITH CORONARY/GRAFT ANGIOGRAM N/A 08/28/2013   Procedure: LEFT HEART CATHETERIZATION WITH Beatrix Fetters;  Surgeon: Sinclair Grooms, MD;  Location[facility];  Service: Cardiovascular;  Laterality: N/A;  . MELANOMA EXCISION  X 1    "ear"  . RADIOACTIVE PROSTATE SEED IMPLANTS  1998  . SKIN CANCER EXCISION  X 2   "top of head"  . TONSILLECTOMY AND ADENOIDECTOMY  1954       Home Medications    Prior to Admission medications   Medication Sig Start Date End Date Taking? Authorizing Provider  acetaminophen (TYLENOL) 500 MG tablet Take 1,000 mg by mouth every 6 (six) hours as needed for pain or fever.     [provider]  apixaban (ELIQUIS) 5 MG TABS tablet Take 1 tablet (5 mg total) by mouth 2 (two) times daily. 05/10/17   Burnell Blanks, MD  atorvastatin (LIPITOR) 80 MG tablet Take 0.5 tablets (40 mg total) by mouth every evening. 05/24/17   Marletta Lor, MD  Calcium Carbonate Antacid (TUMS PO) Take 3 tablets by mouth daily as needed (gas).  [provider]  erythromycin ophthalmic ointment Place a 1/2 inch ribbon of ointment into the lower eyelid every 4 hours for 5 days 06/11/17   Margarita Mail, PA-C  furosemide (LASIX) 20 MG tablet Take 1 tablet (20 mg total) by mouth daily as needed. For swelling Patient taking differently: Take 20 mg by mouth daily as needed (for swelling).  05/08/17 08/06/17  Burnell Blanks, MD  galantamine (RAZADYNE ER) 8 MG 24 hr capsule Take 8 mg by mouth daily with breakfast.    [provider]  hydrocortisone (ANUSOL-HC) 2.5 % rectal cream Place 1 application rectally 2 (two) times daily. 03/01/17   Marletta Lor, MD  hydroxypropyl methylcellulose (ISOPTO TEARS) 2.5 % ophthalmic solution Place 1 drop into both eyes 2 (two) times daily.     [provider]  Multiple Vitamin (MULTIVITAMIN WITH MINERALS) TABS Take 1 tablet by mouth daily.    [provider]  nitroGLYCERIN (NITROSTAT) 0.4 MG SL tablet Place 0.4 mg under the tongue every 5 (five) minutes as needed for chest pain.     [provider]  oseltamivir (TAMIFLU) 30 MG capsule Take 1 capsule (30 mg total) by mouth 2 (two) times daily. 05/15/17   Patrecia Pour, MD  ranitidine (ZANTAC) 150 MG tablet Take 1 tablet (150 mg total) by mouth 2 (two) times daily. 05/18/16   Burnell Blanks, MD    Family History Family History  Problem Relation Age of Onset  . Heart disease Mother   . Diabetes Father   . Heart disease Brother     Social History Social History   Tobacco Use  . Smoking status: Former Smoker    Years: 40.00    Types: Cigars    Last attempt to quit: 12/24/1983    Years since quitting: 33.4  . Smokeless tobacco: Former Systems developer    Types: Chew    Quit date: 08/28/2013  . Tobacco comment: smoked a pipe  Substance Use Topics  . Alcohol use: No  . Drug use: No     Allergies   Pantoprazole; Nitrofurantoin; Sulfa antibiotics; Lidocaine; and Tape   Review of Systems Review of Systems  Ten systems reviewed and are negative for acute change, except as noted in the HPI.   Physical Exam Updated Vital Signs BP (!) 148/80 (BP Location: Right Arm)   Pulse (!) 57   Temp 98.2 F (36.8 C) (Oral)   Resp 16   Ht 5\' 5"  (1.651 m)   Wt 73.5 kg (162 lb)   SpO2 100%   BMI 26.96 kg/m   Physical Exam  Constitutional: He appears well-developed and well-nourished. No distress.  HENT:  Head: Normocephalic and atraumatic.  Eyes: EOM are normal. Pupils are equal, round, and reactive to light. Right eye exhibits chemosis and discharge. Right eye exhibits no exudate. No foreign body present in the right eye. Left eye exhibits no chemosis, no discharge and no exudate. No foreign body present in the left eye. Right conjunctiva has a hemorrhage. Left conjunctiva has no hemorrhage. No scleral icterus.  Slit lamp exam:      The right eye shows no corneal flare, no corneal ulcer, no foreign body, no hyphema, no hypopyon and no anterior chamber bulge.  Neck: Normal range of motion. Neck supple.  Cardiovascular: Normal rate, regular rhythm and normal heart sounds.  Pulmonary/Chest: Effort normal and breath sounds normal. No respiratory  distress.  Abdominal: Soft. There is no tenderness.  Musculoskeletal: He exhibits no edema.  Neurological: He is alert.  Skin: Skin is warm and dry. He is not diaphoretic.  Psychiatric: His behavior is normal.  Nursing note and vitals reviewed.    ED Treatments / Results  Labs (all labs ordered are listed, but only abnormal results are displayed) Labs Reviewed - No data to display  EKG  EKG Interpretation None       Radiology Ct Orbits Wo Contrast  Result Date: 06/11/2017 CLINICAL DATA:  84 year old male status post penetrating trauma to the right eye two nights ago. On Eliquis. Pain and bruising. Query retrobulbar injury. EXAM: CT ORBITS WITHOUT CONTRAST TECHNIQUE: Multidetector CT images were obtained using the standard protocol without intravenous contrast. COMPARISON:  Brain MRI 07/18/2014. FINDINGS: Orbits: Intact bilateral orbital walls. Postoperative changes to both globes. The left globe and left intraorbital soft tissues otherwise appear negative. There is subtle asymmetric right preseptal soft tissue thickening. There is associated trace trapped gas along the right lateral sclera (series 3, image 36). And there is an associated small oval hyperdense hematoma measuring approximately 10 millimeters in at region (series 3, image 30 and coronal image 29). This hematoma obscures the attachment of the right lateral rectus muscle, but the muscle elsewhere appears normal. The hematoma does not track deep into the right orbit. The right globe appears intact, previous cataract surgery. The postseptal soft tissues remain normal. Other bones: Osteopenia. Visible skull base and other facial bones appear intact. Visualized sinuses: Chronic right maxillary sinus opacification with hyperdense material. Chronic right mastoid effusion. The other paranasal sinuses and mastoids are stable and well pneumatized. Soft tissues: Negative visible nasopharynx, masticator spaces, superior parapharyngeal  spaces. There is nonspecific asymmetric soft tissue thickening and stranding along the left lateral face soft tissues overlying the zygoma (series 3, image 13). Limited intracranial: Calcified atherosclerosis at the skull base. Grossly stable visible noncontrast brain parenchyma. IMPRESSION: 1. Small focal preseptal hematoma along the right lateral sclera near the attachment of the right lateral rectus muscle. See coronal image 29 and series 3, image 30. However, no retrobulbar hematoma or deep soft tissue injury. No orbital fracture. 2. Nonspecific soft tissue thickening and stranding overlying the left psych Oma. No facial bone fracture identified. 3. Chronic right maxillary sinusitis and mild right mastoid effusion. Electronically Signed   By: Genevie Ann M.D.   On: 06/11/2017 20:34    Procedures Procedures (including critical care time)  Medications Ordered in ED Medications  erythromycin ophthalmic ointment 1 application (not administered)     Initial Impression / Assessment and Plan / ED Course  I have reviewed the triage vital signs and the nursing notes.  Pertinent labs & imaging results that were available during my care of the patient were reviewed by me and considered in my medical decision making (see chart for details).     Patient CT scan reviewed by me.  No evidence of rectal bulbar hematoma.  No zygomatic arch fractures.  His eye examination reveals no hyphema.  He does have some last mattering and discharge on the eye which will be treated with erythromycin ointment.  He has an ophthalmologist and will follow with him tomorrow.  He appears otherwise appropriate for discharge.  I discussed the case with Dr. Wilson Singer who is in agreement with treatment and plan of care.  No visible or active hemorrhage at this time.  Final Clinical Impressions(s) / ED Diagnoses   Final diagnoses:  Subconjunctival hemorrhage of right eye  Mattery eye  Black eye of right side, initial encounter  ED Discharge Orders        Ordered    erythromycin ophthalmic ointment     06/11/17 2200       Margarita Mail, PA-C 06/11/17 2240    Virgel Manifold, MD 06/12/17 [phone]

## 2017-06-12 DIAGNOSIS — H1131 Conjunctival hemorrhage, right eye: Secondary | ICD-10-CM | POA: Diagnosis not present

## 2017-06-19 DIAGNOSIS — M6281 Muscle weakness (generalized): Secondary | ICD-10-CM | POA: Diagnosis not present

## 2017-06-19 DIAGNOSIS — Z936 Other artificial openings of urinary tract status: Secondary | ICD-10-CM | POA: Diagnosis not present

## 2017-06-19 DIAGNOSIS — F0390 Unspecified dementia without behavioral disturbance: Secondary | ICD-10-CM | POA: Diagnosis not present

## 2017-06-19 DIAGNOSIS — I482 Chronic atrial fibrillation: Secondary | ICD-10-CM | POA: Diagnosis not present

## 2017-06-19 DIAGNOSIS — I110 Hypertensive heart disease with heart failure: Secondary | ICD-10-CM | POA: Diagnosis not present

## 2017-06-19 DIAGNOSIS — I5022 Chronic systolic (congestive) heart failure: Secondary | ICD-10-CM | POA: Diagnosis not present

## 2017-06-23 DIAGNOSIS — I482 Chronic atrial fibrillation: Secondary | ICD-10-CM | POA: Diagnosis not present

## 2017-06-23 DIAGNOSIS — I110 Hypertensive heart disease with heart failure: Secondary | ICD-10-CM | POA: Diagnosis not present

## 2017-06-23 DIAGNOSIS — M6281 Muscle weakness (generalized): Secondary | ICD-10-CM | POA: Diagnosis not present

## 2017-06-23 DIAGNOSIS — I5022 Chronic systolic (congestive) heart failure: Secondary | ICD-10-CM | POA: Diagnosis not present

## 2017-06-23 DIAGNOSIS — Z936 Other artificial openings of urinary tract status: Secondary | ICD-10-CM | POA: Diagnosis not present

## 2017-06-23 DIAGNOSIS — F0390 Unspecified dementia without behavioral disturbance: Secondary | ICD-10-CM | POA: Diagnosis not present

## 2017-06-26 DIAGNOSIS — I110 Hypertensive heart disease with heart failure: Secondary | ICD-10-CM | POA: Diagnosis not present

## 2017-06-26 DIAGNOSIS — I5022 Chronic systolic (congestive) heart failure: Secondary | ICD-10-CM | POA: Diagnosis not present

## 2017-06-26 DIAGNOSIS — M6281 Muscle weakness (generalized): Secondary | ICD-10-CM | POA: Diagnosis not present

## 2017-06-26 DIAGNOSIS — Z936 Other artificial openings of urinary tract status: Secondary | ICD-10-CM | POA: Diagnosis not present

## 2017-06-26 DIAGNOSIS — I482 Chronic atrial fibrillation: Secondary | ICD-10-CM | POA: Diagnosis not present

## 2017-06-26 DIAGNOSIS — F0390 Unspecified dementia without behavioral disturbance: Secondary | ICD-10-CM | POA: Diagnosis not present

## 2017-07-06 DIAGNOSIS — N35112 Postinfective bulbous urethral stricture, not elsewhere classified: Secondary | ICD-10-CM | POA: Diagnosis not present

## 2017-07-07 DIAGNOSIS — M6281 Muscle weakness (generalized): Secondary | ICD-10-CM | POA: Diagnosis not present

## 2017-07-07 DIAGNOSIS — I5022 Chronic systolic (congestive) heart failure: Secondary | ICD-10-CM | POA: Diagnosis not present

## 2017-07-07 DIAGNOSIS — F0390 Unspecified dementia without behavioral disturbance: Secondary | ICD-10-CM | POA: Diagnosis not present

## 2017-07-07 DIAGNOSIS — I110 Hypertensive heart disease with heart failure: Secondary | ICD-10-CM | POA: Diagnosis not present

## 2017-07-07 DIAGNOSIS — Z936 Other artificial openings of urinary tract status: Secondary | ICD-10-CM | POA: Diagnosis not present

## 2017-07-07 DIAGNOSIS — I482 Chronic atrial fibrillation: Secondary | ICD-10-CM | POA: Diagnosis not present

## 2017-08-03 DIAGNOSIS — N35112 Postinfective bulbous urethral stricture, not elsewhere classified: Secondary | ICD-10-CM | POA: Diagnosis not present

## 2017-08-31 DIAGNOSIS — N3020 Other chronic cystitis without hematuria: Secondary | ICD-10-CM | POA: Diagnosis not present

## 2017-08-31 DIAGNOSIS — C61 Malignant neoplasm of prostate: Secondary | ICD-10-CM | POA: Diagnosis not present

## 2017-09-18 DIAGNOSIS — L0390 Cellulitis, unspecified: Secondary | ICD-10-CM | POA: Diagnosis not present

## 2017-09-26 DIAGNOSIS — Z939 Artificial opening status, unspecified: Secondary | ICD-10-CM | POA: Diagnosis not present

## 2017-09-26 DIAGNOSIS — N35812 Other urethral bulbous stricture, male: Secondary | ICD-10-CM | POA: Diagnosis not present

## 2017-10-24 DIAGNOSIS — N3281 Overactive bladder: Secondary | ICD-10-CM | POA: Diagnosis not present

## 2017-11-21 DIAGNOSIS — C61 Malignant neoplasm of prostate: Secondary | ICD-10-CM | POA: Diagnosis not present

## 2017-11-29 ENCOUNTER — Ambulatory Visit (INDEPENDENT_AMBULATORY_CARE_PROVIDER_SITE_OTHER): Payer: Medicare Other | Admitting: Cardiovascular Disease

## 2017-11-29 ENCOUNTER — Encounter: Payer: Self-pay | Admitting: Cardiovascular Disease

## 2017-11-29 ENCOUNTER — Ambulatory Visit: Payer: Medicare Other | Admitting: Cardiovascular Disease

## 2017-11-29 VITALS — BP 118/60 | HR 61 | Ht 65.0 in | Wt 160.1 lb

## 2017-11-29 DIAGNOSIS — E7800 Pure hypercholesterolemia, unspecified: Secondary | ICD-10-CM | POA: Diagnosis not present

## 2017-11-29 DIAGNOSIS — I255 Ischemic cardiomyopathy: Secondary | ICD-10-CM

## 2017-11-29 DIAGNOSIS — I480 Paroxysmal atrial fibrillation: Secondary | ICD-10-CM

## 2017-11-29 DIAGNOSIS — I5022 Chronic systolic (congestive) heart failure: Secondary | ICD-10-CM | POA: Diagnosis not present

## 2017-11-29 DIAGNOSIS — I10 Essential (primary) hypertension: Secondary | ICD-10-CM | POA: Diagnosis not present

## 2017-11-29 DIAGNOSIS — I25810 Atherosclerosis of coronary artery bypass graft(s) without angina pectoris: Secondary | ICD-10-CM

## 2017-11-29 MED ORDER — FUROSEMIDE 20 MG PO TABS: 20.0000 mg | ORAL_TABLET | Freq: Every day | ORAL | 6 refills | Status: DC | PRN

## 2017-11-29 NOTE — Patient Instructions (Signed)
Medication Instructions:  Your physician has recommended you make the following change in your medication:  Take lasix 20 mg daily for the next 5 days and then return to normal dose of lasix 20 mg daily as needed for swelling   Labwork: none  Testing/Procedures: none  Follow-Up: Your physician wants you to follow-up in: 6 months.  You will receive a reminder letter in the mail two months in advance. If you don't receive a letter, please call our office to schedule the follow-up appointment.   Any Other Special Instructions Will Be Listed Below (If Applicable).     If you need a refill on your cardiac medications before your next appointment, please call your pharmacy.

## 2017-11-29 NOTE — Progress Notes (Signed)
Chief Complaint  Patient presents with  . Follow-up    CAD   History of Present Illness: 84 yo male with history of CAD s/p CABG in 1989 and redo bypass in 1997, HTN, HLD, mild dementia, prostate cancer and atrial fibrillation here today for cardiac follow up. He was admitted to [facility] June 2015 with chest pain. Cardiac cath 08/28/13 with 95% left main stenosis, occluded LAD, occluded Circumflex, occluded RCA. Patent LIMA to LAD, patent SVG to OM supplying collaterals to RCA. Occluded SVG to RCA and SVG to intermediate branch. Medical therapy recommended. LVEF=35-40% by LVgram in 2015. His pain resolved with addition of Zantac. He had several admissions in 2015-2017 with urosepsis with known history of kidney stones and urethral stricture. Echo October 2017 with LVEF=40-45%, mild MR. He was admitted to [facility] November 2018 with recurrent urosepsis and atrial fibrillation was noted. Dr. Caryl Comes provided a curbside consult and recommended that the patient be started on Eliquis. He was discharged on Eliquis. ASA was stopped. Echo November 2018 with LVEF=50-55%. Cardiac monitor December 2018 with episodes of atrial fibrillation.   He is here today for follow up. The patient denies any chest pain, palpitations, orthopnea, PND, dizziness, near syncope or syncope. He has mild dyspnea at rest and with exertion. Weight is up several pounds at home. He has mild LE edema. His right leg hurts.    Primary Care Physician: Marletta Lor, MD   Past Medical History:  Diagnosis Date  . Arthritis    "joints ache" (01/02/2014)  . At risk for sleep apnea    STOP-BANG= 4    SENT TO PCP 12-23-2013  . Bladder calculi   . CHF (congestive heart failure) ([place])   . Chronic cystitis   . Coronary artery disease CARDIOLOGIST-  DR Grace Bushy  [place] -- S/P  11/89 CABG x6 (70% circ; 90% PD; 60-70% distal left main; 30% left circ; 90% 1st diag;, 2nd diag and 3rd diag. w/90%,  mild stenosis LAD and 60-70% pLAD)  Re-do CABG x5  in 1997  . Diverticulosis   . GERD (gastroesophageal reflux disease)   . History of Bell's palsy    RIGHT SIDE-- NO RESIDUAL  . Hx of dizziness   . Hyperlipidemia   . Hypertension    "not anymore" (01/02/2014)  . Ischemic cardiomyopathy    ef 35-40% per cath 08-28-2013  . Kidney stones "years ago"   "passed them"  . Melanoma of ear ([place])    "right"  . Mild dementia   . Myocardial infarction ([place]) 1986; 1997  . Nocturia   . OAB (overactive bladder)   . Prostate cancer ([place]) 1998   S/P  [place]; [place]  . S/P CABG (coronary artery bypass graft)    [place]  . Skipped heart beats    occasional  . Urethral stricture   . UTI (urinary tract infection) 09/2015  . Wears hearing aid    bilateral  . Wears partial dentures     Past Surgical History:  Procedure Laterality Date  . CARDIAC CATHETERIZATION  02-22-2005  dr Vidal Schwalbe   mild to moderate lv dysfunction with inferobasilar akinesis/  totally occluded SVG to Intermediate Diagonal and SVG to PDA and RCA branches, totally occluded native coronary circulation with diffuse disease pLAD diagonal system with potentially could be ischemic/  patent SVG to OM with collaterals to dRCA and patent LIMA to LAD and diagonal systemss  .  CARDIAC CATHETERIZATION  08-28-2013  DR Daneen Schick   widely patent sequential left internal graft to the diagonal/LAD, widely patent SVG to OM with proximal 50% narrowing noted in the graft, total occlusion SVG's to RCA, RI, and the Diagonal/ LV dysfunction with inferobasal aneurysm and mid anterior wall region of akinesis/  overall EF 35-40%/  Total occlusion of the navtive circulation/  no significant change compared to 2006 cath  . CARDIOVASCULAR STRESS TEST  08-01-2011  dr Angelena Form   inferior scar and possible soft tissue attenuation with minimal peri-infarct ischemia, small region of anterior ischemia and scar/  LVEF 53% LV wall motion with inferior hypokinesis/ no  significant change from scan july 2011  . CATARACT EXTRACTION W/ INTRAOCULAR LENS  IMPLANT, BILATERAL Bilateral 2007  . CORONARY ARTERY BYPASS GRAFT  11/ [place]-- 6 vessel/  1997 Re-do 5 vessel  . CYSTOSCOPY WITH URETHRAL DILATATION N/A 12/25/2013   Procedure: CYSTOSCOPY WITH URETHRAL DILATATION, WITH BIOPSY;  Surgeon: Bernestine Amass, MD;  Location: [facility];  Service: Urology;  Laterality: N/A;  . CYSTOSCOPY WITH URETHRAL DILATATION N/A 12/22/2014   Procedure: CYSTOSCOPY WITH URETHRAL DILATATION;  Surgeon: Rana Snare, MD;  Location: [facility];  Service: Urology;  Laterality: N/A;  BALLOON DILATION CATHETER    . EUS  10/05/2011   Procedure: ESOPHAGEAL ENDOSCOPIC ULTRASOUND (EUS) RADIAL;  Surgeon: Arta Silence, MD;  Location: [facility];  Service: Endoscopy;  Laterality: N/A;  . INSERTION OF SUPRAPUBIC CATHETER N/A 12/22/2014   Procedure: INSERTION OF SUPRAPUBIC CATHETER ;  Surgeon: Rana Snare, MD;  Location: [facility];  Service: Urology;  Laterality: N/A;  . LAPAROSCOPIC CHOLECYSTECTOMY  12-23-2005  . LEFT HEART CATHETERIZATION WITH CORONARY/GRAFT ANGIOGRAM N/A 08/28/2013   Procedure: LEFT HEART CATHETERIZATION WITH Beatrix Fetters;  Surgeon: Sinclair Grooms, MD;  Location: [facility] CATH LAB;  Service: Cardiovascular;  Laterality: N/A;  . MELANOMA EXCISION  X 1   "ear"  . RADIOACTIVE PROSTATE SEED IMPLANTS  1998  . SKIN CANCER EXCISION  X 2   "top of head"  . TONSILLECTOMY AND ADENOIDECTOMY  1954    Current Outpatient Medications  Medication Sig Dispense Refill  . acetaminophen (TYLENOL) 500 MG tablet Take 1,000 mg by mouth every 6 (six) hours as needed for pain or fever.     Marland Kitchen apixaban (ELIQUIS) 5 MG TABS tablet Take 1 tablet (5 mg total) by mouth 2 (two) times daily. 60 tablet 11  . atorvastatin (LIPITOR) 80 MG tablet Take 0.5 tablets (40 mg total) by mouth every evening.    . Calcium Carbonate Antacid (TUMS PO) Take 3 tablets by mouth daily as  needed (gas).    . galantamine (RAZADYNE ER) 8 MG 24 hr capsule Take 8 mg by mouth daily with breakfast.    . hydroxypropyl methylcellulose (ISOPTO TEARS) 2.5 % ophthalmic solution Place 1 drop into both eyes 2 (two) times daily.     . Multiple Vitamin (MULTIVITAMIN WITH MINERALS) TABS Take 1 tablet by mouth daily.    . nitroGLYCERIN (NITROSTAT) 0.4 MG SL tablet Place 0.4 mg under the tongue every 5 (five) minutes as needed for chest pain.     . ranitidine (ZANTAC) 150 MG tablet Take 1 tablet (150 mg total) by mouth 2 (two) times daily.    . furosemide (LASIX) 20 MG tablet Take 1 tablet (20 mg total) by mouth daily as needed. For swelling 30 tablet 6   No current facility-administered medications for this  visit.     Allergies  Allergen Reactions  . Pantoprazole Other (See Comments)    Headache and lightheaded  . Nitrofurantoin Hives  . Sulfa Antibiotics Hives and Itching  . Lidocaine Swelling    Mouth swelling   . Tape Other (See Comments)    TAPE WILL TEAR THE SKIN!!!    Social History   Socioeconomic History  . Marital status: Married    Spouse name: Not on file  . Number of children: Not on file  . Years of education: Not on file  . Highest education level: Not on file  Occupational History  . Not on file  Social Needs  . Financial resource strain: Not on file  . Food insecurity:    Worry: Not on file    Inability: Not on file  . Transportation needs:    Medical: Not on file    Non-medical: Not on file  Tobacco Use  . Smoking status: Former Smoker    Years: 40.00    Types: Cigars    Last attempt to quit: 12/24/1983    Years since quitting: 33.9  . Smokeless tobacco: Former Systems developer    Types: Chew    Quit date: 08/28/2013  . Tobacco comment: smoked a pipe  Substance and Sexual Activity  . Alcohol use: No  . Drug use: No  . Sexual activity: Not on file  Lifestyle  . Physical activity:    Days per week: Not on file    Minutes per session: Not on file  . Stress:  Not on file  Relationships  . Social connections:    Talks on phone: Not on file    Gets together: Not on file    Attends religious service: Not on file    Active member of club or organization: Not on file    Attends meetings of clubs or organizations: Not on file    Relationship status: Not on file  . Intimate partner violence:    Fear of current or ex partner: Not on file    Emotionally abused: Not on file    Physically abused: Not on file    Forced sexual activity: Not on file  Other Topics Concern  . Not on file  Social History Narrative   Was a Clinical cytogeneticist   Was in the [employer] x 4 years   Father died and felt he needed to come home to help his mother    Was asked to rejoin      2 children; boy and girl   3 grand children   One grandson in [place], one grandson in [place];    [place] a job as Nurse, adult dtr; [place].     Family History  Problem Relation Age of Onset  . Heart disease Mother   . Diabetes Father   . Heart disease Brother     Review of Systems:  As stated in the HPI and otherwise negative.   BP 118/60   Pulse 61   Ht 5\' 5"  (1.651 m)   Wt 160 lb 1.9 oz (72.6 kg)   SpO2 98%   BMI 26.65 kg/m   Physical Examination:  General: Well developed, well nourished, NAD  HEENT: OP clear, mucus membranes moist  SKIN: warm, dry. No rashes. Neuro: No focal deficits  Musculoskeletal: Muscle strength 5/5 all ext  Psychiatric: Mood and affect normal  Neck: No JVD, no carotid bruits, no thyromegaly, no lymphadenopathy.  Lungs:Clear bilaterally, no wheezes,  rhonci, crackles Cardiovascular: Irreg irreg. No murmurs, gallops or rubs. Abdomen:Soft. Bowel sounds present. Non-tender.  Extremities: Trace bilateral lower extremity edema. Pulses are 2 + in the bilateral DP/PT.  Cardiac cath 08/28/13: The left main coronary artery is is severely involved distally with a string-like obstruction of at least 95%. This vessel is heavily calcified..  The  left anterior descending artery is totally occluded..  The left circumflex artery is totally occluded.  The right coronary artery is totally occluded proximally. Right to right homocollaterals and noted to feel a right ventricular branch.Marland Kitchen  BYPASS GRAFT ANGIOGRAPHY: The saphenous vein graft to the obtuse marginal is widely patent there is proximal segmental 30-50% narrowing. The obtuse marginal territory supplies collaterals to the distal right coronary.  The saphenous vein graft to the right coronary is totally occluded  The saphenous vein graft to the ramus/diagonal is totally occluded  The left internal mammary graft to the LAD and diagonal is widely patent  LEFT VENTRICULOGRAM: Left ventricular angiogram was done in the 30 RAO projection and revealed a well formed inferoposterior aneurysm. The LV cavity is dilated. A focal portion of the mid anterior wall is akinetic. Overall LV function reveals an EF of 35-40%. No obvious mitral regurgitation is noted although power injection was not performed and mild regurgitation could be missed.  IMPRESSIONS: 1. Widely patent sequential left internal graft to the diagonal/LAD.  2. Widely patent saphenous vein graft to the obtuse marginal with proximal 50% narrowing noted in the graft.  3. Total occlusion of the saphenous vein grafts to the right coronary, the ramus intermedius, and the diagonal.  4. Left ventricular dysfunction with inferobasal aneurysm and mid anterior wall region of akinesis. Overall EF is 35-40% .  5. Total occlusion of the native circulation.  Echo November 2018: - Left ventricle: septal hypokinesis Moderate basal septal   hypertrophy. The cavity size was normal. Systolic function was   normal. The estimated ejection fraction was in the range of 50%   to 55%. Wall motion was normal; there were no regional wall   motion abnormalities. Left ventricular diastolic function   parameters were normal. - Left atrium: The atrium was mildly  dilated. - Atrial septum: No defect or patent foramen ovale was identified.  Impressions:  - There was no evidence of a vegetation.  EKG:  EKG is not ordered today. The ekg ordered today demonstrates   Recent Labs: 02/16/2017: B Natriuretic Peptide 351.6 05/11/2017: ALT 20 05/13/2017: BUN 15; Creatinine, Ser 1.23; Hemoglobin 13.0; Magnesium 1.9; Platelets 95; Potassium 3.5; Sodium 137   Lipid Panel    Component Value Date/Time   CHOL 134 05/10/2017 0741   TRIG 57 05/10/2017 0741   HDL 47 05/10/2017 0741   CHOLHDL 2.9 05/10/2017 0741   CHOLHDL 2.6 07/23/2015 0755   VLDL 10 07/23/2015 0755   LDLCALC 76 05/10/2017 0741     Wt Readings from Last 3 Encounters:  11/29/17 160 lb 1.9 oz (72.6 kg)  06/11/17 162 lb (73.5 kg)  05/24/17 158 lb (71.7 kg)     Other studies Reviewed: Additional studies/ records that were reviewed today include: . Review of the above records demonstrates:    Assessment and Plan:   1. CAD s/p CABG with  angina: Stable chest pain, unchanged. CAD stable by cath in 2015. Continue statin. He has not tolerated beta blockers due to bradycardia. His Imdur has been stopped due to hypotension. No ASA  since he has been started on Eliquis.    2.  Hyperlipidemia: Lipids controlled. Continue statin.   3. HTN: BP is controlled. No changes today  4. Ischemic cardiomyopathy/chronic systolic CHF: CHJS=43-83% by echo in November 2018. He does not tolerate Ace-inh or beta blockers. Volume status is up today by a few lbs with mild LE edema. He will try taking Lasix 20 mg every day for 5 days and see if this helps his breathing.   5. Atrial fibrillation: He appears to be in atrial fib today. Heart rate is 61 bpm. Will continue Eliquis.    Current medicines are reviewed at length with the patient today.  The patient does not have concerns regarding medicines.  The following changes have been made:  no change  Labs/ tests ordered today include:   No orders of the  defined types were placed in this encounter.   Disposition:   FU with me in 6 months  Signed, Lauree Chandler, MD 11/29/2017 3:14 PM    [facility] [address], [place], [place]  [address] Phone: [phone]; Fax: [phone]

## 2017-12-19 DIAGNOSIS — N3281 Overactive bladder: Secondary | ICD-10-CM | POA: Diagnosis not present

## 2017-12-25 ENCOUNTER — Inpatient Hospital Stay (HOSPITAL_COMMUNITY)
Admission: EM | Admit: 2017-12-25 | Discharge: 2017-12-29 | DRG: 698 | Disposition: A | Discharge status: Home Health | Payer: Medicare Other | Attending: Internal Medicine | Admitting: Internal Medicine

## 2017-12-25 ENCOUNTER — Encounter (HOSPITAL_COMMUNITY): Payer: Self-pay | Admitting: *Deleted

## 2017-12-25 ENCOUNTER — Emergency Department (HOSPITAL_COMMUNITY): Payer: Medicare Other

## 2017-12-25 DIAGNOSIS — N183 Chronic kidney disease, stage 3 unspecified: Secondary | ICD-10-CM | POA: Diagnosis present

## 2017-12-25 DIAGNOSIS — N390 Urinary tract infection, site not specified: Secondary | ICD-10-CM | POA: Diagnosis not present

## 2017-12-25 DIAGNOSIS — T83510A Infection and inflammatory reaction due to cystostomy catheter, initial encounter: Principal | ICD-10-CM | POA: Diagnosis present

## 2017-12-25 DIAGNOSIS — I252 Old myocardial infarction: Secondary | ICD-10-CM

## 2017-12-25 DIAGNOSIS — E7800 Pure hypercholesterolemia, unspecified: Secondary | ICD-10-CM | POA: Diagnosis present

## 2017-12-25 DIAGNOSIS — Z87442 Personal history of urinary calculi: Secondary | ICD-10-CM

## 2017-12-25 DIAGNOSIS — K219 Gastro-esophageal reflux disease without esophagitis: Secondary | ICD-10-CM | POA: Diagnosis present

## 2017-12-25 DIAGNOSIS — I130 Hypertensive heart and chronic kidney disease with heart failure and stage 1 through stage 4 chronic kidney disease, or unspecified chronic kidney disease: Secondary | ICD-10-CM | POA: Diagnosis not present

## 2017-12-25 DIAGNOSIS — D696 Thrombocytopenia, unspecified: Secondary | ICD-10-CM | POA: Diagnosis present

## 2017-12-25 DIAGNOSIS — Z79899 Other long term (current) drug therapy: Secondary | ICD-10-CM

## 2017-12-25 DIAGNOSIS — Z9842 Cataract extraction status, left eye: Secondary | ICD-10-CM

## 2017-12-25 DIAGNOSIS — A4152 Sepsis due to Pseudomonas: Secondary | ICD-10-CM | POA: Diagnosis present

## 2017-12-25 DIAGNOSIS — Z961 Presence of intraocular lens: Secondary | ICD-10-CM | POA: Diagnosis present

## 2017-12-25 DIAGNOSIS — Z923 Personal history of irradiation: Secondary | ICD-10-CM

## 2017-12-25 DIAGNOSIS — I5032 Chronic diastolic (congestive) heart failure: Secondary | ICD-10-CM | POA: Diagnosis not present

## 2017-12-25 DIAGNOSIS — R0902 Hypoxemia: Secondary | ICD-10-CM | POA: Diagnosis not present

## 2017-12-25 DIAGNOSIS — I2510 Atherosclerotic heart disease of native coronary artery without angina pectoris: Secondary | ICD-10-CM | POA: Diagnosis present

## 2017-12-25 DIAGNOSIS — Z87891 Personal history of nicotine dependence: Secondary | ICD-10-CM

## 2017-12-25 DIAGNOSIS — R0602 Shortness of breath: Secondary | ICD-10-CM

## 2017-12-25 DIAGNOSIS — E785 Hyperlipidemia, unspecified: Secondary | ICD-10-CM | POA: Diagnosis present

## 2017-12-25 DIAGNOSIS — Z7901 Long term (current) use of anticoagulants: Secondary | ICD-10-CM

## 2017-12-25 DIAGNOSIS — F0390 Unspecified dementia without behavioral disturbance: Secondary | ICD-10-CM | POA: Diagnosis present

## 2017-12-25 DIAGNOSIS — Z8546 Personal history of malignant neoplasm of prostate: Secondary | ICD-10-CM

## 2017-12-25 DIAGNOSIS — Z888 Allergy status to other drugs, medicaments and biological substances status: Secondary | ICD-10-CM

## 2017-12-25 DIAGNOSIS — Z85820 Personal history of malignant melanoma of skin: Secondary | ICD-10-CM

## 2017-12-25 DIAGNOSIS — R110 Nausea: Secondary | ICD-10-CM | POA: Diagnosis not present

## 2017-12-25 DIAGNOSIS — Z91048 Other nonmedicinal substance allergy status: Secondary | ICD-10-CM

## 2017-12-25 DIAGNOSIS — N3281 Overactive bladder: Secondary | ICD-10-CM | POA: Diagnosis present

## 2017-12-25 DIAGNOSIS — Z9049 Acquired absence of other specified parts of digestive tract: Secondary | ICD-10-CM

## 2017-12-25 DIAGNOSIS — I5042 Chronic combined systolic (congestive) and diastolic (congestive) heart failure: Secondary | ICD-10-CM | POA: Diagnosis present

## 2017-12-25 DIAGNOSIS — Y846 Urinary catheterization as the cause of abnormal reaction of the patient, or of later complication, without mention of misadventure at the time of the procedure: Secondary | ICD-10-CM | POA: Diagnosis present

## 2017-12-25 DIAGNOSIS — I480 Paroxysmal atrial fibrillation: Secondary | ICD-10-CM | POA: Diagnosis present

## 2017-12-25 DIAGNOSIS — Z9359 Other cystostomy status: Secondary | ICD-10-CM

## 2017-12-25 DIAGNOSIS — A419 Sepsis, unspecified organism: Secondary | ICD-10-CM | POA: Diagnosis present

## 2017-12-25 DIAGNOSIS — Z974 Presence of external hearing-aid: Secondary | ICD-10-CM

## 2017-12-25 DIAGNOSIS — Z951 Presence of aortocoronary bypass graft: Secondary | ICD-10-CM

## 2017-12-25 DIAGNOSIS — Z8249 Family history of ischemic heart disease and other diseases of the circulatory system: Secondary | ICD-10-CM

## 2017-12-25 DIAGNOSIS — N3020 Other chronic cystitis without hematuria: Secondary | ICD-10-CM | POA: Diagnosis not present

## 2017-12-25 DIAGNOSIS — I255 Ischemic cardiomyopathy: Secondary | ICD-10-CM | POA: Diagnosis present

## 2017-12-25 DIAGNOSIS — R509 Fever, unspecified: Secondary | ICD-10-CM | POA: Diagnosis not present

## 2017-12-25 DIAGNOSIS — Z972 Presence of dental prosthetic device (complete) (partial): Secondary | ICD-10-CM

## 2017-12-25 DIAGNOSIS — Z9841 Cataract extraction status, right eye: Secondary | ICD-10-CM

## 2017-12-25 DIAGNOSIS — I10 Essential (primary) hypertension: Secondary | ICD-10-CM | POA: Diagnosis present

## 2017-12-25 MED ORDER — SODIUM CHLORIDE 0.9 % IV BOLUS (SEPSIS): 250.0000 mL | Freq: Once | INTRAVENOUS | Status: DC

## 2017-12-25 MED ORDER — SODIUM CHLORIDE 0.9 % IV BOLUS (SEPSIS): 1000.0000 mL | Freq: Once | INTRAVENOUS | Status: DC

## 2017-12-25 MED ORDER — SODIUM CHLORIDE 0.9 % IV BOLUS (SEPSIS)
1000.0000 mL | Freq: Once | INTRAVENOUS | Status: AC
  Administered 2017-12-25: 1000 mL via INTRAVENOUS

## 2017-12-25 MED ORDER — ACETAMINOPHEN 325 MG PO TABS
650.0000 mg | ORAL_TABLET | Freq: Once | ORAL | Status: AC
  Administered 2017-12-25: 650 mg via ORAL
  Filled 2017-12-25: qty 2

## 2017-12-25 NOTE — ED Notes (Signed)
Patient transported to X-ray 

## 2017-12-25 NOTE — ED Provider Notes (Signed)
[facility] EMERGENCY DEPARTMENT Provider Note   CSN: [id number] Arrival date & time: 12/25/17  2231     History   Chief Complaint Chief Complaint  Patient presents with  . Fever    HPI Ronald [name] is a 84 y.o. male.   84 y/o male with hx of CAD s/p CABG, ICM (LVEF 50-55% in 2018), HTN, A fib (on chronic Eliquis) prostate CA, chronic suprapubic catheter presents to the ED for fever. Was feeling fine earlier today; was walking in the neighborhood and "digging in the yard", per wife. Began to have onset of chills and rigors late this afternoon. Was given 500mg  Tylenol at 2000. EMS called by family and attempted to administer 1000mg  tylenol, but patient patient threw up immediately after. Denies any pain at present, specifically chest pain, abdominal pain, nausea, headache. Wife reports hx of urosepsis. Last admission was in February for influenza.     Past Medical History:  Diagnosis Date  . Arthritis    "joints ache" (01/02/2014)  . At risk for sleep apnea    STOP-BANG= 4    SENT TO PCP 12-23-2013  . Bladder calculi   . CHF (congestive heart failure) ([place])   . Chronic cystitis   . Coronary artery disease CARDIOLOGIST-  DR Grace Bushy  MI -- S/P  11/89 CABG x6 (70% circ; 90% PD; 60-70% distal left main; 30% left circ; 90% 1st diag;, 2nd diag and 3rd diag. w/90%,  mild stenosis LAD and 60-70% pLAD)  Re-do CABG x5 in 1997  . Diverticulosis   . GERD (gastroesophageal reflux disease)   . History of Bell's palsy    RIGHT SIDE-- NO RESIDUAL  . Hx of dizziness   . Hyperlipidemia   . Hypertension    "not anymore" (01/02/2014)  . Ischemic cardiomyopathy    ef 35-40% per cath 08-28-2013  . Kidney stones "years ago"   "passed them"  . Melanoma of ear ([place])    "right"  . Mild dementia   . Myocardial infarction ([place]) 1986; 1997  . Nocturia   . OAB (overactive bladder)   . Prostate cancer ([place]) 1998   S/P  [place]; [place]  .  S/P CABG (coronary artery bypass graft)    [place]  . Skipped heart beats    occasional  . Urethral stricture   . UTI (urinary tract infection) 09/2015  . Wears hearing aid    bilateral  . Wears partial dentures     Patient Active Problem List   Diagnosis Date Noted  . Fever 05/11/2017  . AKI (acute kidney injury) ([place])   . Stage 2 chronic kidney disease   . Bacteremia due to Pseudomonas 01/21/2016  . Debility 01/21/2016  . History of Bell's palsy   . Gastroesophageal reflux disease without esophagitis   . Coronary artery disease involving coronary bypass graft of native heart without angina pectoris   . History of prostate cancer   . Suprapubic catheter ([place])   . Incontinence of feces   . Renal failure syndrome   . Bacteremia   . NSTEMI (non-ST elevated myocardial infarction) ([place]) 01/16/2016  . Septic shock ([place])   . ARF (acute renal failure) ([place]) 10/13/2015  . Influenza A 12/05/2014  . Urinary tract infection associated with cystostomy catheter ([place]) 10/03/2014  . Acute lower UTI 09/10/2014  . Bacteremia due to Enterococcus 01/22/2014  . Chronic systolic CHF (congestive heart failure) ([place])  07/24/202015  . Urethral stricture 12/25/2013  . Bladder calculus 12/25/2013  . LV dysfunction 09/23/2013  . Unstable angina ([place]) 08/27/2013  . Bacteremia due to Escherichia coli 05/20/2012  . Urinary tract infection 09/15/2011  . Calculus of kidney 09/12/2011  . HYPERCHOLESTEROLEMIA 10/18/2007  . HTN (hypertension) 10/18/2007  . OSTEOARTHRITIS 02/07/2007  . BELL'S PALSY, RIGHT 10/26/2006  . Coronary atherosclerosis 10/26/2006  . PROSTATE CANCER, HX OF 10/26/2006  . NEPHROLITHIASIS, HX OF 10/26/2006    Past Surgical History:  Procedure Laterality Date  . CARDIAC CATHETERIZATION  02-22-2005  dr Vidal Schwalbe   mild to moderate lv dysfunction with inferobasilar akinesis/  totally occluded SVG to Intermediate Diagonal and SVG to PDA and RCA branches, totally occluded  native coronary circulation with diffuse disease pLAD diagonal system with potentially could be ischemic/  patent SVG to OM with collaterals to dRCA and patent LIMA to LAD and diagonal systemss  . CARDIAC CATHETERIZATION  08-28-2013  DR Daneen Schick   widely patent sequential left internal graft to the diagonal/LAD, widely patent SVG to OM with proximal 50% narrowing noted in the graft, total occlusion SVG's to RCA, RI, and the Diagonal/ LV dysfunction with inferobasal aneurysm and mid anterior wall region of akinesis/  overall EF 35-40%/  Total occlusion of the navtive circulation/  no significant change compared to 2006 cath  . CARDIOVASCULAR STRESS TEST  08-01-2011  dr Angelena Form   inferior scar and possible soft tissue attenuation with minimal peri-infarct ischemia, small region of anterior ischemia and scar/  LVEF 53% LV wall motion with inferior hypokinesis/ no significant change from scan july 2011  . CATARACT EXTRACTION W/ INTRAOCULAR LENS  IMPLANT, BILATERAL Bilateral 2007  . CORONARY ARTERY BYPASS GRAFT  11/ [place]-- 6 vessel/  1997 Re-do 5 vessel  . CYSTOSCOPY WITH URETHRAL DILATATION N/A 12/25/2013   Procedure: CYSTOSCOPY WITH URETHRAL DILATATION, WITH BIOPSY;  Surgeon: Bernestine Amass, MD;  Location: [facility];  Service: Urology;  Laterality: N/A;  . CYSTOSCOPY WITH URETHRAL DILATATION N/A 12/22/2014   Procedure: CYSTOSCOPY WITH URETHRAL DILATATION;  Surgeon: Rana Snare, MD;  Location: [facility];  Service: Urology;  Laterality: N/A;  BALLOON DILATION CATHETER    . EUS  10/05/2011   Procedure: ESOPHAGEAL ENDOSCOPIC ULTRASOUND (EUS) RADIAL;  Surgeon: Arta Silence, MD;  Location: [facility];  Service: Endoscopy;  Laterality: N/A;  . INSERTION OF SUPRAPUBIC CATHETER N/A 12/22/2014   Procedure: INSERTION OF SUPRAPUBIC CATHETER ;  Surgeon: Rana Snare, MD;  Location: [facility];  Service: Urology;  Laterality: N/A;  . LAPAROSCOPIC CHOLECYSTECTOMY  12-23-2005  .  LEFT HEART CATHETERIZATION WITH CORONARY/GRAFT ANGIOGRAM N/A 08/28/2013   Procedure: LEFT HEART CATHETERIZATION WITH Beatrix Fetters;  Surgeon: Sinclair Grooms, MD;  Location: [facility] CATH LAB;  Service: Cardiovascular;  Laterality: N/A;  . MELANOMA EXCISION  X 1   "ear"  . RADIOACTIVE PROSTATE SEED IMPLANTS  1998  . SKIN CANCER EXCISION  X 2   "top of head"  . TONSILLECTOMY AND ADENOIDECTOMY  1954        Home Medications    Prior to Admission medications   Medication Sig Start Date End Date Taking? Authorizing Provider  acetaminophen (TYLENOL) 500 MG tablet Take 1,000 mg by mouth every 6 (six) hours as needed for pain or fever.    Yes [provider]  apixaban (ELIQUIS) 5 MG TABS tablet Take 1 tablet (5 mg total) by mouth 2 (two) times daily. 05/10/17  Yes Burnell Blanks, MD  atorvastatin (LIPITOR) 80 MG tablet Take 0.5 tablets (40 mg total) by mouth every evening. 05/24/17  Yes Marletta Lor, MD  Calcium Carbonate Antacid (TUMS PO) Take 3 tablets by mouth daily as needed (gas).   Yes [provider]  furosemide (LASIX) 20 MG tablet Take 1 tablet (20 mg total) by mouth daily as needed. For swelling 11/29/17  Yes Burnell Blanks, MD  galantamine (RAZADYNE ER) 8 MG 24 hr capsule Take 8 mg by mouth daily with breakfast.   Yes [provider]  hydroxypropyl methylcellulose (ISOPTO TEARS) 2.5 % ophthalmic solution Place 1 drop into both eyes 2 (two) times daily.    Yes [provider]  Multiple Vitamin (MULTIVITAMIN WITH MINERALS) TABS Take 1 tablet by mouth daily.   Yes [provider]  nitroGLYCERIN (NITROSTAT) 0.4 MG SL tablet Place 0.4 mg under the tongue every 5 (five) minutes as needed for chest pain.    Yes [provider]  ranitidine (ZANTAC) 150 MG tablet Take 1 tablet (150 mg total) by mouth 2 (two) times daily. 05/18/16  Yes Burnell Blanks, MD    Family History Family History  Problem Relation  Age of Onset  . Heart disease Mother   . Diabetes Father   . Heart disease Brother     Social History Social History   Tobacco Use  . Smoking status: Former Smoker    Years: 40.00    Types: Cigars    Last attempt to quit: 12/24/1983    Years since quitting: 34.0  . Smokeless tobacco: Former Systems developer    Types: Chew    Quit date: 08/28/2013  . Tobacco comment: smoked a pipe  Substance Use Topics  . Alcohol use: No  . Drug use: No     Allergies   Pantoprazole; Nitrofurantoin; Sulfa antibiotics; Lidocaine; and Tape   Review of Systems Review of Systems Ten systems reviewed and are negative for acute change, except as noted in the HPI.    Physical Exam Updated Vital Signs BP (!) 111/50   Pulse 81   Temp 99.1 F (37.3 C) (Oral)   Resp 20   Ht 5\' 5"  (1.651 m)   Wt 71.3 kg   SpO2 95%   BMI 26.15 kg/m   Physical Exam  Constitutional: He is oriented to person, place, and time. He appears well-developed and well-nourished. No distress.  Alert and nontoxic, in NAD  HENT:  Head: Normocephalic and atraumatic.  Bandage over right temple  Eyes: Conjunctivae and EOM are normal. No scleral icterus.  Neck: Normal range of motion.  Cardiovascular: Normal rate, regular rhythm and intact distal pulses.  Pulmonary/Chest: Effort normal. No stridor. No respiratory distress. He has no wheezes. He has no rales.  Respirations even and unlabored  Abdominal: He exhibits no distension. There is no tenderness. There is no guarding.  Suprapubic catheter. Abdomen soft, nontender.  Musculoskeletal: Normal range of motion.  Neurological: He is alert and oriented to person, place, and time. He exhibits normal muscle tone. Coordination normal.  Skin: Skin is warm and dry. No rash noted. He is not diaphoretic. No erythema. No pallor.  Psychiatric: He has a normal mood and affect. His behavior is normal.  Nursing note and vitals reviewed.    ED Treatments / Results  Labs (all labs ordered are  listed, but only abnormal results are displayed) Labs Reviewed  COMPREHENSIVE METABOLIC PANEL - Abnormal; Notable for the following components:  Result Value   CO2 21 (*)    Glucose, Bld 137 (*)    Creatinine, Ser 1.25 (*)    Calcium 8.6 (*)    Total Protein 5.6 (*)    Albumin 3.3 (*)    AST 44 (*)    GFR calc non Af Amer 50 (*)    GFR calc Af Amer 57 (*)    All other components within normal limits  CBC WITH DIFFERENTIAL/PLATELET - Abnormal; Notable for the following components:   WBC 14.3 (*)    RBC 4.16 (*)    Hemoglobin 12.4 (*)    HCT 38.5 (*)    Platelets 112 (*)    Neutro Abs 13.5 (*)    Lymphs Abs 0.2 (*)    All other components within normal limits  URINALYSIS, ROUTINE W REFLEX MICROSCOPIC - Abnormal; Notable for the following components:   APPearance HAZY (*)    Hgb urine dipstick LARGE (*)    Nitrite POSITIVE (*)    Leukocytes, UA LARGE (*)    RBC / HPF >50 (*)    WBC, UA >50 (*)    Bacteria, UA FEW (*)    All other components within normal limits  I-STAT CG4 LACTIC ACID, ED - Abnormal; Notable for the following components:   Lactic Acid, Venous 2.26 (*)    All other components within normal limits  I-STAT CHEM 8, ED - Abnormal; Notable for the following components:   BUN 24 (*)    Glucose, Bld 150 (*)    TCO2 21 (*)    Hemoglobin 12.6 (*)    HCT 37.0 (*)    All other components within normal limits  CULTURE, BLOOD (ROUTINE X 2)  CULTURE, BLOOD (ROUTINE X 2)  URINE CULTURE  I-STAT CG4 LACTIC ACID, ED    EKG EKG Interpretation  Date/Time:  Monday December 25 2017 23:45:58 EDT Ventricular Rate:  82 PR Interval:    QRS Duration: 141 QT Interval:  403 QTC Calculation: 471 R Axis:   -21 Text Interpretation:  Sinus rhythm Prolonged PR interval LVH with secondary repolarization abnormality Confirmed by Thayer Jew [phone]) on 12/26/2017 12:00:05 AM   Radiology Dg Chest 2 View  Result Date: 12/25/2017 CLINICAL DATA:  Fever. EXAM: CHEST - 2  VIEW COMPARISON:  Radiographs 05/11/2017 FINDINGS: Lung volumes are low. Patient is post median sternotomy and CABG. Unchanged heart size and mediastinal contours. No confluent airspace disease. No pulmonary edema, large pleural effusion or pneumothorax. Osseous structures are unchanged, spine evaluation on the lateral view limited by overlying artifact. IMPRESSION: Low lung volumes.  No acute findings.  No evidence of pneumonia. Electronically Signed   By: Keith Rake M.D.   On: 12/25/2017 23:32    Procedures Procedures (including critical care time)  Medications Ordered in ED Medications  sodium chloride 0.9 % bolus 1,000 mL (1,000 mLs Intravenous New Bag/Given 12/25/17 2334)  acetaminophen (TYLENOL) tablet 650 mg (650 mg Oral Given 12/25/17 2334)  cefTRIAXone (ROCEPHIN) 1 g in sodium chloride 0.9 % 100 mL IVPB (1 g Intravenous New Bag/Given 12/26/17 0100)    CRITICAL CARE Performed by: Antonietta Breach   Total critical care time: 35 minutes  Critical care time was exclusive of separately billable procedures and treating other patients.  Critical care was necessary to treat or prevent imminent or life-threatening deterioration.  Critical care was time spent personally by me on the following activities: development of treatment plan with patient and/or surrogate as well as nursing, discussions with consultants, evaluation  of patient's response to treatment, examination of patient, obtaining history from patient or surrogate, ordering and performing treatments and interventions, ordering and review of laboratory studies, ordering and review of radiographic studies, pulse oximetry and re-evaluation of patient's condition.   Initial Impression / Assessment and Plan / ED Course  I have reviewed the triage vital signs and the nursing notes.  Pertinent labs & imaging results that were available during my care of the patient were reviewed by me and considered in my medical decision making (see  chart for details).     84 year old male presents to the ED for onset of fever and chills tonight.  Was febrile in the emergency department with leukocytosis on laboratory evaluation.  Lactate slightly elevated.  Sepsis appears secondary to urinary tract infection.  Patient with known suprapubic catheter.  He was started on IV Rocephin.  Given IV fluids.  Will be admitted to [facility] for further management.   Final Clinical Impressions(s) / ED Diagnoses   Final diagnoses:  Sepsis secondary to UTI [facility])    ED Discharge Orders    None       Antonietta Breach, PA-C 12/26/17 [id number]    Merryl Hacker, MD 12/26/17 [phone]

## 2017-12-25 NOTE — ED Triage Notes (Signed)
Per EMS, patient has been feeling unwell for few days. On scene patient was shaking uncontrollable with a fever between 102 and 104. Attempted 1000 mg tylenol but patient threw up and so believed he did not have all the tylenol down. Hx of suprapubic catheter. Left hand peripheral in situ. Patient A/Ox 3 currently.

## 2017-12-26 ENCOUNTER — Inpatient Hospital Stay (HOSPITAL_COMMUNITY): Payer: Medicare Other

## 2017-12-26 ENCOUNTER — Other Ambulatory Visit: Payer: Self-pay

## 2017-12-26 DIAGNOSIS — A419 Sepsis, unspecified organism: Secondary | ICD-10-CM | POA: Diagnosis not present

## 2017-12-26 DIAGNOSIS — A4152 Sepsis due to Pseudomonas: Secondary | ICD-10-CM | POA: Diagnosis present

## 2017-12-26 DIAGNOSIS — D696 Thrombocytopenia, unspecified: Secondary | ICD-10-CM | POA: Diagnosis present

## 2017-12-26 DIAGNOSIS — I130 Hypertensive heart and chronic kidney disease with heart failure and stage 1 through stage 4 chronic kidney disease, or unspecified chronic kidney disease: Secondary | ICD-10-CM | POA: Diagnosis present

## 2017-12-26 DIAGNOSIS — N3281 Overactive bladder: Secondary | ICD-10-CM | POA: Diagnosis present

## 2017-12-26 DIAGNOSIS — I480 Paroxysmal atrial fibrillation: Secondary | ICD-10-CM | POA: Diagnosis not present

## 2017-12-26 DIAGNOSIS — N3020 Other chronic cystitis without hematuria: Secondary | ICD-10-CM | POA: Diagnosis present

## 2017-12-26 DIAGNOSIS — N390 Urinary tract infection, site not specified: Secondary | ICD-10-CM | POA: Diagnosis not present

## 2017-12-26 DIAGNOSIS — Z961 Presence of intraocular lens: Secondary | ICD-10-CM | POA: Diagnosis present

## 2017-12-26 DIAGNOSIS — Z87442 Personal history of urinary calculi: Secondary | ICD-10-CM | POA: Diagnosis not present

## 2017-12-26 DIAGNOSIS — Y846 Urinary catheterization as the cause of abnormal reaction of the patient, or of later complication, without mention of misadventure at the time of the procedure: Secondary | ICD-10-CM | POA: Diagnosis present

## 2017-12-26 DIAGNOSIS — Z7901 Long term (current) use of anticoagulants: Secondary | ICD-10-CM | POA: Diagnosis not present

## 2017-12-26 DIAGNOSIS — K219 Gastro-esophageal reflux disease without esophagitis: Secondary | ICD-10-CM | POA: Diagnosis not present

## 2017-12-26 DIAGNOSIS — I5032 Chronic diastolic (congestive) heart failure: Secondary | ICD-10-CM

## 2017-12-26 DIAGNOSIS — Z972 Presence of dental prosthetic device (complete) (partial): Secondary | ICD-10-CM | POA: Diagnosis not present

## 2017-12-26 DIAGNOSIS — I255 Ischemic cardiomyopathy: Secondary | ICD-10-CM | POA: Diagnosis present

## 2017-12-26 DIAGNOSIS — I252 Old myocardial infarction: Secondary | ICD-10-CM | POA: Diagnosis not present

## 2017-12-26 DIAGNOSIS — I129 Hypertensive chronic kidney disease with stage 1 through stage 4 chronic kidney disease, or unspecified chronic kidney disease: Secondary | ICD-10-CM | POA: Diagnosis not present

## 2017-12-26 DIAGNOSIS — I10 Essential (primary) hypertension: Secondary | ICD-10-CM

## 2017-12-26 DIAGNOSIS — R7881 Bacteremia: Secondary | ICD-10-CM | POA: Diagnosis not present

## 2017-12-26 DIAGNOSIS — T83510A Infection and inflammatory reaction due to cystostomy catheter, initial encounter: Secondary | ICD-10-CM | POA: Diagnosis present

## 2017-12-26 DIAGNOSIS — Z9359 Other cystostomy status: Secondary | ICD-10-CM

## 2017-12-26 DIAGNOSIS — Z85820 Personal history of malignant melanoma of skin: Secondary | ICD-10-CM | POA: Diagnosis not present

## 2017-12-26 DIAGNOSIS — E785 Hyperlipidemia, unspecified: Secondary | ICD-10-CM | POA: Diagnosis present

## 2017-12-26 DIAGNOSIS — I5042 Chronic combined systolic (congestive) and diastolic (congestive) heart failure: Secondary | ICD-10-CM | POA: Diagnosis present

## 2017-12-26 DIAGNOSIS — Z9842 Cataract extraction status, left eye: Secondary | ICD-10-CM | POA: Diagnosis not present

## 2017-12-26 DIAGNOSIS — Z951 Presence of aortocoronary bypass graft: Secondary | ICD-10-CM | POA: Diagnosis not present

## 2017-12-26 DIAGNOSIS — J9811 Atelectasis: Secondary | ICD-10-CM | POA: Diagnosis not present

## 2017-12-26 DIAGNOSIS — F0390 Unspecified dementia without behavioral disturbance: Secondary | ICD-10-CM | POA: Diagnosis present

## 2017-12-26 DIAGNOSIS — T83510D Infection and inflammatory reaction due to cystostomy catheter, subsequent encounter: Secondary | ICD-10-CM | POA: Diagnosis not present

## 2017-12-26 DIAGNOSIS — Z974 Presence of external hearing-aid: Secondary | ICD-10-CM | POA: Diagnosis not present

## 2017-12-26 DIAGNOSIS — I2510 Atherosclerotic heart disease of native coronary artery without angina pectoris: Secondary | ICD-10-CM | POA: Diagnosis not present

## 2017-12-26 DIAGNOSIS — N183 Chronic kidney disease, stage 3 (moderate): Secondary | ICD-10-CM | POA: Diagnosis not present

## 2017-12-26 DIAGNOSIS — Z9841 Cataract extraction status, right eye: Secondary | ICD-10-CM | POA: Diagnosis not present

## 2017-12-26 LAB — URINALYSIS, ROUTINE W REFLEX MICROSCOPIC
Bilirubin Urine: NEGATIVE
Glucose, UA: NEGATIVE mg/dL
Ketones, ur: NEGATIVE mg/dL
Nitrite: POSITIVE — AB
Protein, ur: NEGATIVE mg/dL
RBC / HPF: >50 RBC/hpf — ABNORMAL HIGH (ref 0–5)
Specific Gravity, Urine: 1.011 (ref 1.005–1.030)
WBC, UA: >50 WBC/hpf — ABNORMAL HIGH (ref 0–5)
pH: 6 (ref 5.0–8.0)

## 2017-12-26 LAB — BASIC METABOLIC PANEL
Anion gap: 3 — ABNORMAL LOW (ref 5–15)
BUN: 20 mg/dL (ref 8–23)
CO2: 22 mmol/L (ref 22–32)
Calcium: 7.9 mg/dL — ABNORMAL LOW (ref 8.9–10.3)
Chloride: 114 mmol/L — ABNORMAL HIGH (ref 98–111)
Creatinine, Ser: 1.18 mg/dL (ref 0.61–1.24)
GFR calc Af Amer: >60 mL/min (ref >60)
GFR calc non Af Amer: 53 mL/min — ABNORMAL LOW (ref >60)
Glucose, Bld: 151 mg/dL — ABNORMAL HIGH (ref 70–99)
Potassium: 3.7 mmol/L (ref 3.5–5.1)
Sodium: 139 mmol/L (ref 135–145)

## 2017-12-26 LAB — CBC
HCT: 35.7 % — ABNORMAL LOW (ref 39.0–52.0)
Hemoglobin: 11.5 g/dL — ABNORMAL LOW (ref 13.0–17.0)
MCH: 30.3 pg (ref 26.0–34.0)
MCHC: 32.2 g/dL (ref 30.0–36.0)
MCV: 93.9 fL (ref 78.0–100.0)
Platelets: 102 10*3/uL — ABNORMAL LOW (ref 150–400)
RBC: 3.8 MIL/uL — ABNORMAL LOW (ref 4.22–5.81)
RDW: 13.5 % (ref 11.5–15.5)
WBC: 17.6 10*3/uL — ABNORMAL HIGH (ref 4.0–10.5)

## 2017-12-26 LAB — CBC WITH DIFFERENTIAL/PLATELET
Abs Immature Granulocytes: 0.1 10*3/uL (ref 0.0–0.1)
Basophils Absolute: 0 10*3/uL (ref 0.0–0.1)
Basophils Relative: 0 %
Eosinophils Absolute: 0 10*3/uL (ref 0.0–0.7)
Eosinophils Relative: 0 %
HCT: 38.5 % — ABNORMAL LOW (ref 39.0–52.0)
Hemoglobin: 12.4 g/dL — ABNORMAL LOW (ref 13.0–17.0)
Immature Granulocytes: 1 %
Lymphocytes Relative: 1 %
Lymphs Abs: 0.2 10*3/uL — ABNORMAL LOW (ref 0.7–4.0)
MCH: 29.8 pg (ref 26.0–34.0)
MCHC: 32.2 g/dL (ref 30.0–36.0)
MCV: 92.5 fL (ref 78.0–100.0)
Monocytes Absolute: 0.4 10*3/uL (ref 0.1–1.0)
Monocytes Relative: 3 %
Neutro Abs: 13.5 10*3/uL — ABNORMAL HIGH (ref 1.7–7.7)
Neutrophils Relative %: 95 %
Platelets: 112 10*3/uL — ABNORMAL LOW (ref 150–400)
RBC: 4.16 MIL/uL — ABNORMAL LOW (ref 4.22–5.81)
RDW: 13.4 % (ref 11.5–15.5)
WBC: 14.3 10*3/uL — ABNORMAL HIGH (ref 4.0–10.5)

## 2017-12-26 LAB — I-STAT CG4 LACTIC ACID, ED
Lactic Acid, Venous: 1.24 mmol/L (ref 0.5–1.9)
Lactic Acid, Venous: 2.26 mmol/L (ref 0.5–1.9)

## 2017-12-26 LAB — COMPREHENSIVE METABOLIC PANEL
ALT: 27 U/L (ref 0–44)
AST: 44 U/L — ABNORMAL HIGH (ref 15–41)
Albumin: 3.3 g/dL — ABNORMAL LOW (ref 3.5–5.0)
Alkaline Phosphatase: 70 U/L (ref 38–126)
Anion gap: 6 (ref 5–15)
BUN: 23 mg/dL (ref 8–23)
CO2: 21 mmol/L — ABNORMAL LOW (ref 22–32)
Calcium: 8.6 mg/dL — ABNORMAL LOW (ref 8.9–10.3)
Chloride: 111 mmol/L (ref 98–111)
Creatinine, Ser: 1.25 mg/dL — ABNORMAL HIGH (ref 0.61–1.24)
GFR calc Af Amer: 57 mL/min — ABNORMAL LOW (ref >60)
GFR calc non Af Amer: 50 mL/min — ABNORMAL LOW (ref >60)
Glucose, Bld: 137 mg/dL — ABNORMAL HIGH (ref 70–99)
Potassium: 3.5 mmol/L (ref 3.5–5.1)
Sodium: 138 mmol/L (ref 135–145)
Total Bilirubin: 0.9 mg/dL (ref 0.3–1.2)
Total Protein: 5.6 g/dL — ABNORMAL LOW (ref 6.5–8.1)

## 2017-12-26 LAB — I-STAT CHEM 8, ED
BUN: 24 mg/dL — ABNORMAL HIGH (ref 8–23)
Calcium, Ion: 1.24 mmol/L (ref 1.15–1.40)
Chloride: 105 mmol/L (ref 98–111)
Creatinine, Ser: 1.2 mg/dL (ref 0.61–1.24)
Glucose, Bld: 150 mg/dL — ABNORMAL HIGH (ref 70–99)
HCT: 37 % — ABNORMAL LOW (ref 39.0–52.0)
Hemoglobin: 12.6 g/dL — ABNORMAL LOW (ref 13.0–17.0)
Potassium: 3.7 mmol/L (ref 3.5–5.1)
Sodium: 142 mmol/L (ref 135–145)
TCO2: 21 mmol/L — ABNORMAL LOW (ref 22–32)

## 2017-12-26 LAB — BRAIN NATRIURETIC PEPTIDE: B Natriuretic Peptide: 280.6 pg/mL — ABNORMAL HIGH (ref 0.0–100.0)

## 2017-12-26 LAB — PROCALCITONIN: Procalcitonin: 8.15 ng/mL

## 2017-12-26 MED ORDER — ADULT MULTIVITAMIN W/MINERALS CH
1.0000 | ORAL_TABLET | Freq: Every day | ORAL | Status: DC
  Administered 2017-12-26 – 2017-12-29 (×4): 1 via ORAL
  Filled 2017-12-26 (×4): qty 1

## 2017-12-26 MED ORDER — ATORVASTATIN CALCIUM 20 MG PO TABS
40.0000 mg | ORAL_TABLET | Freq: Every evening | ORAL | Status: DC
  Administered 2017-12-26 – 2017-12-28 (×3): 40 mg via ORAL
  Filled 2017-12-26 (×3): qty 2

## 2017-12-26 MED ORDER — APIXABAN 5 MG PO TABS
5.0000 mg | ORAL_TABLET | Freq: Two times a day (BID) | ORAL | Status: DC
  Administered 2017-12-26 – 2017-12-29 (×8): 5 mg via ORAL
  Filled 2017-12-26 (×8): qty 1

## 2017-12-26 MED ORDER — FAMOTIDINE 20 MG PO TABS
20.0000 mg | ORAL_TABLET | Freq: Two times a day (BID) | ORAL | Status: DC
  Administered 2017-12-26 – 2017-12-29 (×8): 20 mg via ORAL
  Filled 2017-12-26 (×8): qty 1

## 2017-12-26 MED ORDER — ONDANSETRON HCL 4 MG PO TABS: 4.0000 mg | ORAL_TABLET | Freq: Four times a day (QID) | ORAL | Status: DC | PRN

## 2017-12-26 MED ORDER — SODIUM CHLORIDE 0.9 % IV SOLN
INTRAVENOUS | Status: DC
  Administered 2017-12-26: 05:00:00 via INTRAVENOUS

## 2017-12-26 MED ORDER — ZOLPIDEM TARTRATE 5 MG PO TABS: 5.0000 mg | ORAL_TABLET | Freq: Every evening | ORAL | Status: DC | PRN

## 2017-12-26 MED ORDER — HYPROMELLOSE (GONIOSCOPIC) 2.5 % OP SOLN
1.0000 [drp] | Freq: Two times a day (BID) | OPHTHALMIC | Status: DC
  Administered 2017-12-26 – 2017-12-28 (×7): 1 [drp] via OPHTHALMIC
  Filled 2017-12-26 (×2): qty 15

## 2017-12-26 MED ORDER — ACETAMINOPHEN 325 MG PO TABS
650.0000 mg | ORAL_TABLET | Freq: Four times a day (QID) | ORAL | Status: DC | PRN
  Administered 2017-12-26 – 2017-12-27 (×4): 650 mg via ORAL
  Filled 2017-12-26 (×4): qty 2

## 2017-12-26 MED ORDER — NITROGLYCERIN 0.4 MG SL SUBL: 0.4000 mg | SUBLINGUAL_TABLET | SUBLINGUAL | Status: DC | PRN

## 2017-12-26 MED ORDER — HYDRALAZINE HCL 20 MG/ML IJ SOLN: 5.0000 mg | INTRAMUSCULAR | Status: DC | PRN

## 2017-12-26 MED ORDER — ONDANSETRON HCL 4 MG/2ML IJ SOLN: 4.0000 mg | Freq: Four times a day (QID) | INTRAMUSCULAR | Status: DC | PRN

## 2017-12-26 MED ORDER — GALANTAMINE HYDROBROMIDE ER 8 MG PO CP24
8.0000 mg | ORAL_CAPSULE | Freq: Every day | ORAL | Status: DC
  Administered 2017-12-26 – 2017-12-28 (×4): 8 mg via ORAL
  Filled 2017-12-26 (×4): qty 1

## 2017-12-26 MED ORDER — ACETAMINOPHEN 650 MG RE SUPP: 650.0000 mg | Freq: Four times a day (QID) | RECTAL | Status: DC | PRN

## 2017-12-26 MED ORDER — SENNOSIDES-DOCUSATE SODIUM 8.6-50 MG PO TABS: 1.0000 | ORAL_TABLET | Freq: Every evening | ORAL | Status: DC | PRN

## 2017-12-26 MED ORDER — SODIUM CHLORIDE 0.9 % IV SOLN
1.0000 g | Freq: Two times a day (BID) | INTRAVENOUS | Status: DC
  Administered 2017-12-26 – 2017-12-28 (×7): 1 g via INTRAVENOUS
  Filled 2017-12-26 (×8): qty 1

## 2017-12-26 MED ORDER — SODIUM CHLORIDE 0.9 % IV SOLN
1.0000 g | Freq: Once | INTRAVENOUS | Status: AC
  Administered 2017-12-26: 1 g via INTRAVENOUS
  Filled 2017-12-26: qty 10

## 2017-12-26 MED ORDER — SODIUM CHLORIDE 0.9 % IV BOLUS (SEPSIS)
1000.0000 mL | Freq: Once | INTRAVENOUS | Status: AC
  Administered 2017-12-26: 1000 mL via INTRAVENOUS

## 2017-12-26 NOTE — Progress Notes (Signed)
PROGRESS NOTE    Ronald [name]  PYP:[id number] DOB: [birth year] DOA: 12/25/2017 PCP: Marletta Lor, MD    Brief Narrative:  84 year old male who presented with fevers and chills.  He does have the significant past medical history for chronic suprapubic catheter, hypertension, dyslipidemia, coronary artery disease, diastolic heart failure, chronic kidney disease stage III, paroxysmal atrial relation and dementia.  Patient was found febrile at home, severely weak and with discomfort at the site of the suprapubic catheter.  He said temperature was 102.9F, blood pressure 96/52, heart rate 74, respiratory rate 18, oxygen saturation 97%.  Heart S1-S2 present, rhythmic, lungs clear to auscultation bilaterally, abdomen with tenderness at the site of the suprapubic catheter, no lower extremity edema.  White count 17.6, hemoglobin 11.5 hematocrit 35.7, platelets 102, urinalysis with greater than 50 white cells, greater than 50 red cells, specific gravity 1.011.  He is a chest x-ray was negative for infiltrates.  Patient was admitted to the hospital with working diagnosis of sepsis due to complicated urinary tract infection, in the setting of suprapubic catheter.   Assessment & Plan:   Principal Problem:   Urinary tract infection associated with cystostomy catheter ([place]) Active Problems:   HYPERCHOLESTEROLEMIA   Coronary atherosclerosis   HTN (hypertension)   Sepsis ([place])   Gastroesophageal reflux disease without esophagitis   Suprapubic catheter ([facility])   CKD (chronic kidney disease), stage III (H[facility])   Chronic diastolic CHF (congestive heart failure) ([facility])   PAF (paroxysmal atrial fibrillation) ([place])   1.  Sepsis due to urinary tract infection related to suprapubic catheter.  Present on admission. Old records personally reviewed, patient had a positive urine culture for pseudomonas in the past, will continue on IV meropenem for now, follow on cultures, cell count and temperature curve. Will  hold on IV fluids, follow a restrictive IV strategy to prevent volume overload. Suprapubic catheter has been changed. Will complete workup with renal US.   2.  Chronic kidney disease stage III. Renal function with serum cr at 1,18, with K at 3,7, will continue to follow up on renal panel in am, avoid hypotension or nephrotoxic medications.  3.  Diastolic heart failure. No signs of exacerbation, will continue blood pressure monitoring, will hold on further IV fluids.   4.  Paroxysmal atrial fibrillation. Rate controlled, continue telemetry monitoring. Anticoagulation with apixaban.   5.  Hypertension. Holding on antihypertensive medications, to prevent hypotension.    DVT prophylaxis: apixaban.   Code Status:  full Family Communication: I spoke with patient's wife at the bedside and all questions were addressed.  Disposition Plan/ discharge barriers: pending clinical improvement    Consultants:     Procedures:     Antimicrobials:   Meropenem.    Subjective: Patient continue to be very weak and deconditioned, positive pain at the site of the catheter, and positive chills plus headache, no dyspnea or chest pain.   Objective: Vitals:   12/26/17 0253 12/26/17 0318 12/26/17 0525 12/26/17 1328  BP:  (!) 108/45 (!) 96/44 (!) 105/50  Pulse:  74 (!) 59 (!) 56  Resp:  18 16 (!) 22  Temp: 98.7 F (37.1 C) 98.1 F (36.7 C) 98.3 F (36.8 C) 98.7 F (37.1 C)  TempSrc: Oral Oral Oral Oral  SpO2:  97% 97% 99%  Weight:      Height:        Intake/Output Summary (Last 24 hours) at 12/26/2017 1715 Last data filed at 12/26/2017 1655 Gross per 24 hour  Intake 3550  ml  Output 600 ml  Net 2950 ml   Filed Weights   12/26/17 0012  Weight: 71.3 kg    Examination:   General: deconditioned and ill looking appearing  Neurology: Awake and alert, non focal  E ENT: mild pallor, no icterus, oral mucosa moist Cardiovascular: No JVD. S1-S2 present, rhythmic, no gallops, rubs, or murmurs.  No lower extremity edema. Pulmonary: positive breath sounds bilaterally, adequate air movement, no wheezing, rhonchi or rales. Gastrointestinal. Abdomen with no organomegaly, non tender, no rebound or guarding/ suprapubic catheter in place.  Skin. No rashes Musculoskeletal: no joint deformities     Data Reviewed: I have personally reviewed following labs and imaging studies  CBC: Recent Labs  Lab 12/25/17 2342 12/26/17 0001 12/26/17 0349  WBC 14.3*  --  17.6*  NEUTROABS 13.5*  --   --   HGB 12.4* 12.6* 11.5*  HCT 38.5* 37.0* 35.7*  MCV 92.5  --  93.9  PLT 112*  --  035*   Basic Metabolic Panel: Recent Labs  Lab 12/25/17 2342 12/26/17 0001 12/26/17 0349  NA 138 142 139  K 3.5 3.7 3.7  CL 111 105 114*  CO2 21*  --  22  GLUCOSE 137* 150* 151*  BUN 23 24* 20  CREATININE 1.25* 1.20 1.18  CALCIUM 8.6*  --  7.9*   GFR: Estimated Creatinine Clearance: 37.6 mL/min (by C-G formula based on SCr of 1.18 mg/dL). Liver Function Tests: Recent Labs  Lab 12/25/17 2342  AST 44*  ALT 27  ALKPHOS 70  BILITOT 0.9  PROT 5.6*  ALBUMIN 3.3*   No results for input(s): LIPASE, AMYLASE in the last 168 hours. No results for input(s): AMMONIA in the last 168 hours. Coagulation Profile: No results for input(s): INR, PROTIME in the last 168 hours. Cardiac Enzymes: No results for input(s): CKTOTAL, CKMB, CKMBINDEX, TROPONINI in the last 168 hours. BNP (last 3 results) No results for input(s): PROBNP in the last 8760 hours. HbA1C: No results for input(s): HGBA1C in the last 72 hours. CBG: No results for input(s): GLUCAP in the last 168 hours. Lipid Profile: No results for input(s): CHOL, HDL, LDLCALC, TRIG, CHOLHDL, LDLDIRECT in the last 72 hours. Thyroid Function Tests: No results for input(s): TSH, T4TOTAL, FREET4, T3FREE, THYROIDAB in the last 72 hours. Anemia Panel: No results for input(s): VITAMINB12, FOLATE, FERRITIN, TIBC, IRON, RETICCTPCT in the last 72  hours.    Radiology Studies: I have reviewed all of the imaging during this hospital visit personally     Scheduled Meds: . apixaban  5 mg Oral BID  . atorvastatin  40 mg Oral QPM  . famotidine  20 mg Oral BID  . galantamine  8 mg Oral Q breakfast  . hydroxypropyl methylcellulose / hypromellose  1 drop Both Eyes BID  . multivitamin with minerals  1 tablet Oral Daily   Continuous Infusions: . meropenem (MERREM) IV 1 g (12/26/17 1010)     LOS: 0 days        Olina Melfi Gerome Apley, MD [facility] Pager [phone]

## 2017-12-26 NOTE — Progress Notes (Signed)
PROGRESS NOTE [facility]   MCKINNON GLICK   ZYS:[id number] DOB: June 27, 1927  DOA: 12/25/2017 PCP: Marletta Lor, MD   Brief Narrative:  Ronald [name] is an 84 year old male with PMH suprapubic catheter, HTN, HLD, CAD, CABG, dCHF, CKD3, prostate cancer, dementia, and pAF on Eliquis. According to his wife, the patient was fine until he developed acute, severe fever and chills late last night. Patient also reports catheter insertion site pain discomfort, but denies other abdominal pain, CP, SOB, N/V, or cough. Patient was tachypneic in the ED with a temperature of 102.9. Lab work revealed WBC 14.3, lactic acid 2.26, O2 sats 95% on RA, unremarkable CXR, and a positive urine analysis. Urine culture collected late last night is still pending. Patient was admitted for sepsis due to UTI associated with cystostomy catheter and was started on Rocephin which has since been changed to Meropenem to provide coverage for pseudomonas and staphylococcus.   Subjective: Patient reports much improvement regarding the severe fever and chills he experienced acutely last night. Admits to a HA this morning and pain at catheter insertion site. Denies SOB, nausea, vomiting, abdominal pain beyond insertion site, or chest pain. Wife reports orthopnea earlier this morning while lying flat on back. Patient states that he is hungry and ready for breakfast.   Assessment & Plan:   Principal Problem:   Urinary tract infection associated with cystostomy catheter ([place]) Active Problems:   HYPERCHOLESTEROLEMIA   Coronary atherosclerosis   HTN (hypertension)   Sepsis ([place])   Gastroesophageal reflux disease without esophagitis   Suprapubic catheter ([facility])   CKD (chronic kidney disease), stage III ([facility])   Chronic diastolic CHF (congestive heart failure) ([facility])   PAF (paroxysmal atrial fibrillation) ([place])  Sepsis due to UTI associated with cystostomy catheter Patient was tachypneic in the ED with a temperature of  102.9. Lab work revealed WBC 14.3, lactic acid 2.26, and a positive urine analysis. Urine culture collected late last night is still pending. LA has resolved (1.24) and patient is no longer febrile or tachypneic, however still has elevated WBC of 17.6. Continue Meropenem, pending results of UCx. Catheter was changed this morning. Will discontinue IVF as patient is eating well and want to avoid him becoming fluid-overloaded. Will order a renal US given frequency of UTIs to rule out any obstructive processes.   Chronic dCHF Patient reports some orthopnea early this morning and appears to have trace pedal edema. Will discontinue IVF in effort to avoid patient becoming fluid-overloaded.   Headache Patient reports a HA today. Ordered Tylenol PRN.   Hyperlipidemia  Continue Lipitor  Coronary atherosclerosis Patient does not report any chest pain or SOB. Continue home medications - Lipitor, NTG PRN  Paroxysmal atrial fibrillation CHA2DS2-VASc Score is 5. Continue Eliquis.    DVT prophylaxis: Eliquis Code Status: Full-code Family Communication: Wife at bedside Disposition Plan: Continue to monitor inpatient   Consultants:   None  Procedures:   None  Antimicrobials:  Meropenem   Objective: Vitals:   12/26/17 0230 12/26/17 0253 12/26/17 0318 12/26/17 0525  BP: (!) 104/50  (!) 108/45 (!) 96/44  Pulse:   74 (!) 59  Resp: 15  18 16   Temp:  98.7 F (37.1 C) 98.1 F (36.7 C) 98.3 F (36.8 C)  TempSrc:  Oral Oral Oral  SpO2:   97% 97%  Weight:      Height:        Intake/Output Summary (Last 24 hours) at 12/26/2017 0918 Last data filed at 12/26/2017 9323 Gross  per 24 hour  Intake 3550 ml  Output 100 ml  Net 3450 ml   Filed Weights   12/26/17 0012  Weight: 71.3 kg    Examination:  General exam: Patient remains under blankets despite warm room Respiratory system: Clear to auscultation. No wheezes, crackles, or rhonchi Cardiovascular system: S1 & S2 heard, RRR. No JVD,  murmurs, rubs or gallops Gastrointestinal system: Abdomen is nondistended, soft and nontender. No organomegaly or masses felt. Normal bowel sounds heard. Central nervous system: Alert and oriented. No focal neurological deficits. Extremities: Trace pedal edema. Symmetric, strength 5/5   Skin: No rashes, lesions or ulcers Psychiatry: Judgment and insight appear normal. Mood & affect appropriate.    Data Reviewed: I have personally reviewed following labs and imaging studies  CBC: Recent Labs  Lab 12/25/17 2342 12/26/17 0001 12/26/17 0349  WBC 14.3*  --  17.6*  NEUTROABS 13.5*  --   --   HGB 12.4* 12.6* 11.5*  HCT 38.5* 37.0* 35.7*  MCV 92.5  --  93.9  PLT 112*  --  353*   Basic Metabolic Panel: Recent Labs  Lab 12/25/17 2342 12/26/17 0001 12/26/17 0349  NA 138 142 139  K 3.5 3.7 3.7  CL 111 105 114*  CO2 21*  --  22  GLUCOSE 137* 150* 151*  BUN 23 24* 20  CREATININE 1.25* 1.20 1.18  CALCIUM 8.6*  --  7.9*   GFR: Estimated Creatinine Clearance: 37.6 mL/min (by C-G formula based on SCr of 1.18 mg/dL). Liver Function Tests: Recent Labs  Lab 12/25/17 2342  AST 44*  ALT 27  ALKPHOS 70  BILITOT 0.9  PROT 5.6*  ALBUMIN 3.3*   No results for input(s): LIPASE, AMYLASE in the last 168 hours. No results for input(s): AMMONIA in the last 168 hours. Coagulation Profile: No results for input(s): INR, PROTIME in the last 168 hours. Cardiac Enzymes: No results for input(s): CKTOTAL, CKMB, CKMBINDEX, TROPONINI in the last 168 hours. BNP (last 3 results) No results for input(s): PROBNP in the last 8760 hours. HbA1C: No results for input(s): HGBA1C in the last 72 hours. CBG: No results for input(s): GLUCAP in the last 168 hours. Lipid Profile: No results for input(s): CHOL, HDL, LDLCALC, TRIG, CHOLHDL, LDLDIRECT in the last 72 hours. Thyroid Function Tests: No results for input(s): TSH, T4TOTAL, FREET4, T3FREE, THYROIDAB in the last 72 hours. Anemia Panel: No results  for input(s): VITAMINB12, FOLATE, FERRITIN, TIBC, IRON, RETICCTPCT in the last 72 hours. Sepsis Labs: Recent Labs  Lab 12/26/17 0002 12/26/17 0211 12/26/17 0349  PROCALCITON  --   --  8.15  LATICACIDVEN 2.26* 1.24  --     Recent Results (from the past 240 hour(s))  Blood Culture (routine x 2)     Status: None (Preliminary result)   Collection Time: 12/25/17 11:43 PM  Result Value Ref Range Status   Specimen Description BLOOD RIGHT WRIST  Final   Special Requests   Final    BOTTLES DRAWN AEROBIC AND ANAEROBIC Blood Culture results may not be optimal due to an excessive volume of blood received in culture bottles   Culture   Final    NO GROWTH < 12 HOURS Performed at [facility] [address] [place], [place] [address]    Report Status PENDING  Incomplete  Blood Culture (routine x 2)     Status: None (Preliminary result)   Collection Time: 12/25/17 11:53 PM  Result Value Ref Range Status   Specimen Description BLOOD RIGHT  ANTECUBITAL  Final   Special Requests   Final    BOTTLES DRAWN AEROBIC AND ANAEROBIC Blood Culture results may not be optimal due to an excessive volume of blood received in culture bottles   Culture   Final    NO GROWTH < 12 HOURS Performed at [facility] [address], [place], [place] [address]    Report Status PENDING  Incomplete      Radiology Studies: Dg Chest 2 View  Result Date: 12/25/2017 CLINICAL DATA:  Fever. EXAM: CHEST - 2 VIEW COMPARISON:  Radiographs 05/11/2017 FINDINGS: Lung volumes are low. Patient is post median sternotomy and CABG. Unchanged heart size and mediastinal contours. No confluent airspace disease. No pulmonary edema, large pleural effusion or pneumothorax. Osseous structures are unchanged, spine evaluation on the lateral view limited by overlying artifact. IMPRESSION: Low lung volumes.  No acute findings.  No evidence of pneumonia. Electronically Signed   By: Keith Rake M.D.   On: 12/25/2017 23:32       Scheduled Meds: . apixaban  5 mg Oral BID  . atorvastatin  40 mg Oral QPM  . famotidine  20 mg Oral BID  . galantamine  8 mg Oral Q breakfast  . hydroxypropyl methylcellulose / hypromellose  1 drop Both Eyes BID  . multivitamin with minerals  1 tablet Oral Daily   Continuous Infusions: . sodium chloride 100 mL/hr at 12/26/17 0436  . meropenem (MERREM) IV 1 g (12/26/17 0439)     LOS: 0 days    Time spent: Total of 40 minutes spent with pt, greater than 50% of which was spent in discussion of  treatment, counseling and coordination of care    Krista Blue, PA-S Pager: Text Page via [email or web address]   If 7PM-7AM, please contact night-coverage [email or web address] 12/26/2017, 9:18 AM   Note - This record has been created using [employer]. Chart creation errors have been sought, but may not always have been located. Such creation errors do not reflect on the standard of medical care.

## 2017-12-26 NOTE — H&P (Signed)
History and Physical    Ronald [name] LOV:[id number] DOB: Dec 28, 1927 DOA: 12/25/2017  Referring MD/NP/PA:   PCP: Marletta Lor, MD   Patient coming from:  The patient is coming from home.  At baseline, pt is independent for most of ADL  Chief Complaint: fever and chills  HPI: Ronald [name] is a 84 y.o. male with medical history significant of s/p of suprapubic catheter placement, hypertension, hyperlipidemia, CAD, CABG, dCHF, dementia, CKD-3, prostate cancer, PAF on Eliquis, who presents with fever and chills.  Per patient's wife, patient had developed fever, chills today, with temperature 102, 104 at home.  Patient has some abdominal discomfort around the suprapubic catheter placement area. Patient has generalized weakness, no unilateral weakness, no chest pain, shortness of breath, cough.  Patient has chronic mild loose stool bowel movement, which has not changed.  Denies nausea, vomiting.  No unilateral weakness.  Mental status is at baseline. Pt has a small skin cut on the right face when shaving.  ED Course: pt was found to have WBC 14.3, positive urinalysis (large amount of leukocyte, positive nitrite, few bacteria, hazy appearance, WBC> 50), lactic acid 2.26, stable renal function, temperature 102.9, no tachycardia, has tachypnea, oxygen saturation 95% on room air, negative chest x-ray.  Patient is admitted to telemetry bed as inpatient.  Review of Systems:   General: has fevers, chills, no body weight gain, has fatigue HEENT: no blurry vision, hearing changes or sore throat Respiratory: no dyspnea, coughing, wheezing CV: no chest pain, no palpitations GI: no nausea, vomiting, abdominal discomfort, has chronic mild diarrhea. GU: no dysuria, burning on urination, increased urinary frequency, hematuria  Ext: no leg edema Neuro: no unilateral weakness, numbness, or tingling, no vision change or hearing loss Skin: no rash. Pt has a small skin cut on the right face. MSK: No  muscle spasm, no deformity, no limitation of range of movement in spin Heme: No easy bruising.  Travel history: No recent long distant travel.  Allergy:  Allergies  Allergen Reactions  . Pantoprazole Other (See Comments)    Headache and lightheaded  . Nitrofurantoin Hives  . Sulfa Antibiotics Hives and Itching  . Lidocaine Swelling    Mouth swelling   . Tape Other (See Comments)    TAPE WILL TEAR THE SKIN!!!    Past Medical History:  Diagnosis Date  . Arthritis    "joints ache" (01/02/2014)  . At risk for sleep apnea    STOP-BANG= 4    SENT TO PCP 12-23-2013  . Bladder calculi   . CHF (congestive heart failure) ([place])   . Chronic cystitis   . Coronary artery disease CARDIOLOGIST-  DR Grace Bushy  [place] -- S/P  11/89 CABG x6 (70% circ; 90% PD; 60-70% distal left main; 30% left circ; 90% 1st diag;, 2nd diag and 3rd diag. w/90%,  mild stenosis LAD and 60-70% pLAD)  Re-do CABG x5 in 1997  . Diverticulosis   . GERD (gastroesophageal reflux disease)   . History of Bell's palsy    RIGHT SIDE-- NO RESIDUAL  . Hx of dizziness   . Hyperlipidemia   . Hypertension    "not anymore" (01/02/2014)  . Ischemic cardiomyopathy    ef 35-40% per cath 08-28-2013  . Kidney stones "years ago"   "passed them"  . Melanoma of ear ([place])    "right"  . Mild dementia   . Myocardial infarction ([place]) 1986; 1997  . Nocturia   . OAB (overactive bladder)   .  Prostate cancer ([place]) 1998   S/P  [place]; [place]  . S/P CABG (coronary artery bypass graft)    [place]  . Skipped heart beats    occasional  . Urethral stricture   . UTI (urinary tract infection) 09/2015  . Wears hearing aid    bilateral  . Wears partial dentures     Past Surgical History:  Procedure Laterality Date  . CARDIAC CATHETERIZATION  02-22-2005  dr Vidal Schwalbe   mild to moderate lv dysfunction with inferobasilar akinesis/  totally occluded SVG to Intermediate Diagonal and SVG to PDA and  RCA branches, totally occluded native coronary circulation with diffuse disease pLAD diagonal system with potentially could be ischemic/  patent SVG to OM with collaterals to dRCA and patent LIMA to LAD and diagonal systemss  . CARDIAC CATHETERIZATION  08-28-2013  DR Daneen Schick   widely patent sequential left internal graft to the diagonal/LAD, widely patent SVG to OM with proximal 50% narrowing noted in the graft, total occlusion SVG's to RCA, RI, and the Diagonal/ LV dysfunction with inferobasal aneurysm and mid anterior wall region of akinesis/  overall EF 35-40%/  Total occlusion of the navtive circulation/  no significant change compared to 2006 cath  . CARDIOVASCULAR STRESS TEST  08-01-2011  dr Angelena Form   inferior scar and possible soft tissue attenuation with minimal peri-infarct ischemia, small region of anterior ischemia and scar/  LVEF 53% LV wall motion with inferior hypokinesis/ no significant change from scan july 2011  . CATARACT EXTRACTION W/ INTRAOCULAR LENS  IMPLANT, BILATERAL Bilateral 2007  . CORONARY ARTERY BYPASS GRAFT  11/ [place]-- 6 vessel/  1997 Re-do 5 vessel  . CYSTOSCOPY WITH URETHRAL DILATATION N/A 12/25/2013   Procedure: CYSTOSCOPY WITH URETHRAL DILATATION, WITH BIOPSY;  Surgeon: Bernestine Amass, MD;  [facility];  Service: Urology;  Laterality: N/A;  . CYSTOSCOPY WITH URETHRAL DILATATION N/A 12/22/2014   Procedure: CYSTOSCOPY WITH URETHRAL DILATATION;  Surgeon: Rana Snare, MD;  Location: [facility];  Service: Urology;  Laterality: N/A;  BALLOON DILATION CATHETER    . EUS  10/05/2011   Procedure: ESOPHAGEAL ENDOSCOPIC ULTRASOUND (EUS) RADIAL;  Surgeon: Arta Silence, MD;  Location: [facility];  Service: Endoscopy;  Laterality: N/A;  . INSERTION OF SUPRAPUBIC CATHETER N/A 12/22/2014   Procedure: INSERTION OF SUPRAPUBIC CATHETER ;  Surgeon: Rana Snare, MD;  Location: [facility];  Service: Urology;  Laterality: N/A;  . LAPAROSCOPIC  CHOLECYSTECTOMY  12-23-2005  . LEFT HEART CATHETERIZATION WITH CORONARY/GRAFT ANGIOGRAM N/A 08/28/2013   Procedure: LEFT HEART CATHETERIZATION WITH Beatrix Fetters;  Surgeon: Sinclair Grooms, MD;  Location: [name] CATH LAB;  Service: Cardiovascular;  Laterality: N/A;  . MELANOMA EXCISION  X 1   "ear"  . RADIOACTIVE PROSTATE SEED IMPLANTS  1998  . SKIN CANCER EXCISION  X 2   "top of head"  . TONSILLECTOMY AND ADENOIDECTOMY  1954    Social History:  reports that he quit smoking about 34 years ago. His smoking use included cigars. He quit after 40.00 years of use. He quit smokeless tobacco use about 4 years ago.  His smokeless tobacco use included chew. He reports that he does not drink alcohol or use drugs.  Family History:  Family History  Problem Relation Age of Onset  . Heart disease Mother   . Diabetes Father   . Heart disease Brother  Prior to Admission medications   Medication Sig Start Date End Date Taking? Authorizing Provider  acetaminophen (TYLENOL) 500 MG tablet Take 1,000 mg by mouth every 6 (six) hours as needed for pain or fever.    Yes [provider]  apixaban (ELIQUIS) 5 MG TABS tablet Take 1 tablet (5 mg total) by mouth 2 (two) times daily. 05/10/17  Yes Burnell Blanks, MD  atorvastatin (LIPITOR) 80 MG tablet Take 0.5 tablets (40 mg total) by mouth every evening. 05/24/17  Yes Marletta Lor, MD  Calcium Carbonate Antacid (TUMS PO) Take 3 tablets by mouth daily as needed (gas).   Yes [provider]  furosemide (LASIX) 20 MG tablet Take 1 tablet (20 mg total) by mouth daily as needed. For swelling 11/29/17  Yes Burnell Blanks, MD  galantamine (RAZADYNE ER) 8 MG 24 hr capsule Take 8 mg by mouth daily with breakfast.   Yes [provider]  hydroxypropyl methylcellulose (ISOPTO TEARS) 2.5 % ophthalmic solution Place 1 drop into both eyes 2 (two) times daily.    Yes [provider]  Multiple Vitamin  (MULTIVITAMIN WITH MINERALS) TABS Take 1 tablet by mouth daily.   Yes [provider]  nitroGLYCERIN (NITROSTAT) 0.4 MG SL tablet Place 0.4 mg under the tongue every 5 (five) minutes as needed for chest pain.    Yes [provider]  ranitidine (ZANTAC) 150 MG tablet Take 1 tablet (150 mg total) by mouth 2 (two) times daily. 05/18/16  Yes Burnell Blanks, MD    Physical Exam: Vitals:   12/26/17 0215 12/26/17 0230 12/26/17 0253 12/26/17 0318  BP: (!) 96/52 (!) 104/50  (!) 108/45  Pulse:    74  Resp: 19 15  18   Temp:   98.7 F (37.1 C) 98.1 F (36.7 C)  TempSrc:   Oral Oral  SpO2:    97%  Weight:      Height:       General: Not in acute distress HEENT:       Eyes: PERRL, EOMI, no scleral icterus.       ENT: No discharge from the ears and nose, no pharynx injection, no tonsillar enlargement.        Neck: No JVD, no bruit, no mass felt. Heme: No neck lymph node enlargement. Cardiac: S1/S2, RRR, No murmurs, No gallops or rubs. Respiratory: No rales, wheezing, rhonchi or rubs. GI: Soft, nondistended, mild tenderness around suprapubic catheter area, no rebound pain, no organomegaly, BS present. S/p of suprapubic catheter placement GU: No hematuria Ext: No pitting leg edema bilaterally. 2+DP/PT pulse bilaterally. Musculoskeletal: No joint deformities, No joint redness or warmth, no limitation of ROM in spin. Skin: No rashes. Pt has a small skin cut on the right face. Neuro: Alert, oriented X3, cranial nerves II-XII grossly intact, moves all extremities normally.  Psych: Patient is not psychotic, no suicidal or hemocidal ideation.  Labs on Admission: I have personally reviewed following labs and imaging studies  CBC: Recent Labs  Lab 12/25/17 2342 12/26/17 0001  WBC 14.3*  --   NEUTROABS 13.5*  --   HGB 12.4* 12.6*  HCT 38.5* 37.0*  MCV 92.5  --   PLT 112*  --    Basic Metabolic Panel: Recent Labs  Lab 12/25/17 2342 12/26/17 0001  NA 138 142  K  3.5 3.7  CL 111 105  CO2 21*  --   GLUCOSE 137* 150*  BUN 23 24*  CREATININE 1.25* 1.20  CALCIUM 8.6*  --  GFR: Estimated Creatinine Clearance: 37 mL/min (by C-G formula based on SCr of 1.2 mg/dL). Liver Function Tests: Recent Labs  Lab 12/25/17 2342  AST 44*  ALT 27  ALKPHOS 70  BILITOT 0.9  PROT 5.6*  ALBUMIN 3.3*   No results for input(s): LIPASE, AMYLASE in the last 168 hours. No results for input(s): AMMONIA in the last 168 hours. Coagulation Profile: No results for input(s): INR, PROTIME in the last 168 hours. Cardiac Enzymes: No results for input(s): CKTOTAL, CKMB, CKMBINDEX, TROPONINI in the last 168 hours. BNP (last 3 results) No results for input(s): PROBNP in the last 8760 hours. HbA1C: No results for input(s): HGBA1C in the last 72 hours. CBG: No results for input(s): GLUCAP in the last 168 hours. Lipid Profile: No results for input(s): CHOL, HDL, LDLCALC, TRIG, CHOLHDL, LDLDIRECT in the last 72 hours. Thyroid Function Tests: No results for input(s): TSH, T4TOTAL, FREET4, T3FREE, THYROIDAB in the last 72 hours. Anemia Panel: No results for input(s): VITAMINB12, FOLATE, FERRITIN, TIBC, IRON, RETICCTPCT in the last 72 hours. Urine analysis:    Component Value Date/Time   COLORURINE YELLOW 12/25/2017 2355   APPEARANCEUR HAZY (A) 12/25/2017 2355   LABSPEC 1.011 12/25/2017 2355   PHURINE 6.0 12/25/2017 2355   GLUCOSEU NEGATIVE 12/25/2017 2355   HGBUR LARGE (A) 12/25/2017 2355   BILIRUBINUR NEGATIVE 12/25/2017 2355   BILIRUBINUR neg 06/30/2014 1530   KETONESUR NEGATIVE 12/25/2017 2355   PROTEINUR NEGATIVE 12/25/2017 2355   UROBILINOGEN 0.2 12/04/2014 2344   NITRITE POSITIVE (A) 12/25/2017 2355   LEUKOCYTESUR LARGE (A) 12/25/2017 2355   Sepsis Labs: @LABRCNTIP (procalcitonin:4,lacticidven:4) ) Recent Results (from the past 240 hour(s))  Blood Culture (routine x 2)     Status: None (Preliminary result)   Collection Time: 12/25/17 11:43 PM  Result  Value Ref Range Status   Specimen Description BLOOD RIGHT WRIST  Final   Special Requests   Final    BOTTLES DRAWN AEROBIC AND ANAEROBIC Blood Culture results may not be optimal due to an excessive volume of blood received in culture bottles   Culture PENDING  Incomplete   Report Status PENDING  Incomplete     Radiological Exams on Admission: Dg Chest 2 View  Result Date: 12/25/2017 CLINICAL DATA:  Fever. EXAM: CHEST - 2 VIEW COMPARISON:  Radiographs 05/11/2017 FINDINGS: Lung volumes are low. Patient is post median sternotomy and CABG. Unchanged heart size and mediastinal contours. No confluent airspace disease. No pulmonary edema, large pleural effusion or pneumothorax. Osseous structures are unchanged, spine evaluation on the lateral view limited by overlying artifact. IMPRESSION: Low lung volumes.  No acute findings.  No evidence of pneumonia. Electronically Signed   By: Keith Rake M.D.   On: 12/25/2017 23:32     EKG:   Not done in ED, will get one.   Assessment/Plan Principal Problem:   Urinary tract infection associated with cystostomy catheter (HCC) Active Problems:   HYPERCHOLESTEROLEMIA   Coronary atherosclerosis   HTN (hypertension)   Sepsis ([place])   Gastroesophageal reflux disease without esophagitis   Suprapubic catheter (HCC)   CKD (chronic kidney disease), stage III (HCC)   Chronic diastolic CHF (congestive heart failure) (HCC)   PAF (paroxysmal atrial fibrillation) ([place])   Sepsis due to urinary tract infection associated with cystostomy catheter [facility]): Patient admitted critical for sepsis with leukocytosis, fever and tachypnea.  Lactic acid is elevated 2.26.  Currently hemodynamically stable.  Patient has history of positive urine culture with resistant organism including Pseudomonas and Staphylococcus.  Will need  antibiotics with broad coverage.  Patient was a started with Rocephin in ED, which will be changed to meropenem.   - Admit to telemetry bed as inpt -  Meropenem IV - Follow up results of urine and blood cx and amend antibiotic regimen if needed per sensitivity results - prn Zofran for nausea - will get Procalcitonin and trend lactic acid levels per sepsis protocol. - IVF: 1L of NS bolus in ED, followed by 100 cc/h (patient has congestive heart failure, limiting aggressive IV fluids treatment).  Chronic diastolic CHF: 2D echo on 15/40/0867 showed EF of 50-55%.  Patient does not have leg edema or JVD.  No pulmonary edema chest x-ray.  CHF seems to be compensated. -Hold Lasix due to sepsis. -Check BNP  HLD: -Lipitor  Coronary atherosclerosis: s/p of CABG. No CP -lipitor -prn NTG   GERD: -Pepcid  Suprapubic catheter [facility]): changed last week -will change again  CKD (chronic kidney disease), stage III ([place]): stable.  Baseline creatinine 1.1-1.2.  His creatinine is 1.20, BUN 24 -Follow-up renal function by BMP  PAF (paroxysmal atrial fibrillation) ([place]):  CHA2DS2-VASc Score is 5, needs oral anticoagulation. Patient is on Eliquis at home. INR is  on admission. Heart rate is well controlled. -tele monitoring -on Eliquis   Inpatient status:  # Patient requires inpatient status due to high intensity of service, high risk for further deterioration and high frequency of surveillance required.  I certify that at the point of admission it is my clinical judgment that the patient will require inpatient hospital care spanning beyond 2 midnights from the point of admission.  . This patient has multiple chronic comorbidities including s/p of suprapubic catheter placement, hypertension, hyperlipidemia, CAD, CABG, dCHF, dementia, CKD-3, prostate cancer, PAF on Eliquis. . Now patient has presenting symptoms include fever and chills . The worrisome physical exam findings include mild tenderness around suprapubic catheter area . The initial radiographic and laboratory data are worrisome because of leukocytosis, elevated lactic acid, positive  urinalysis, sepsis. . Current medical needs: please see my assessment and plan     DVT ppx: on Eliquis Code Status: Full code Family Communication:   Yes, patient's wife at bed side Disposition Plan:  Anticipate discharge back to previous home environment Consults called:  none Admission status:  Inpatient/tele       Date of Service 12/26/2017    Ivor Costa [facility] Pager [phone]  If 7PM-7AM, please contact night-coverage [email or web address] Password [email or web address] 12/26/2017, 4:50 AM

## 2017-12-26 NOTE — Progress Notes (Signed)
Pharmacy Antibiotic Note  Ronald [name] is a 84 y.o. male admitted on 12/25/2017 with UTI.  Pharmacy has been consulted for Merrem dosing. WBC 14.3. CrCl ~35-40. Hx multiple organisms in the blood and urine.   Plan: Merrem 1g IV q12h Trend WBC, temp, renal function  F/U infectious work-up  Height: 5\' 5"  (165.1 cm) Weight: 157 lb 1.9 oz (71.3 kg) IBW/kg (Calculated) : 61.5  Temp (24hrs), Avg:101 F (38.3 C), Min:99.1 F (37.3 C), Max:102.9 F (39.4 C)  Recent Labs  Lab 12/25/17 2342 12/26/17 0001 12/26/17 0002 12/26/17 0211  WBC 14.3*  --   --   --   CREATININE 1.25* 1.20  --   --   LATICACIDVEN  --   --  2.26* 1.24    Estimated Creatinine Clearance: 37 mL/min (by C-G formula based on SCr of 1.2 mg/dL).    Allergies  Allergen Reactions  . Pantoprazole Other (See Comments)    Headache and lightheaded  . Nitrofurantoin Hives  . Sulfa Antibiotics Hives and Itching  . Lidocaine Swelling    Mouth swelling   . Tape Other (See Comments)    TAPE WILL TEAR THE SKIN!!!   Ronald [name], Ronald [name] 12/26/2017 2:20 AM

## 2017-12-26 NOTE — Consult Note (Addendum)
[facility] Nurse wound consult note Reason for Consult: Consult requested for right cheek; pt states he cut it at home when shaving and it was bleeding large amt. Wound type: full thickness healing wound; .1X.1X.1cm, pink and moist, no further bleeding or erythemia Dressing procedure/placement/frequency: Leave open to air.  If bleeding re-occurs, then foam dressing can be applied.  Discussed plan of care with patient and wife at the bedside. Please re-consult if further assistance is needed.  Thank-you,  Julien Girt MSN, [place], [place], [place], [place]

## 2017-12-26 NOTE — Discharge Instructions (Signed)

## 2017-12-27 DIAGNOSIS — I5032 Chronic diastolic (congestive) heart failure: Secondary | ICD-10-CM

## 2017-12-27 DIAGNOSIS — I2510 Atherosclerotic heart disease of native coronary artery without angina pectoris: Secondary | ICD-10-CM

## 2017-12-27 DIAGNOSIS — T83510D Infection and inflammatory reaction due to cystostomy catheter, subsequent encounter: Secondary | ICD-10-CM

## 2017-12-27 DIAGNOSIS — A419 Sepsis, unspecified organism: Secondary | ICD-10-CM

## 2017-12-27 DIAGNOSIS — N390 Urinary tract infection, site not specified: Secondary | ICD-10-CM

## 2017-12-27 DIAGNOSIS — N183 Chronic kidney disease, stage 3 (moderate): Secondary | ICD-10-CM

## 2017-12-27 LAB — CBC WITH DIFFERENTIAL/PLATELET
Abs Immature Granulocytes: 0 10*3/uL (ref 0.0–0.1)
Basophils Absolute: 0 10*3/uL (ref 0.0–0.1)
Basophils Relative: 0 %
Eosinophils Absolute: 0.1 10*3/uL (ref 0.0–0.7)
Eosinophils Relative: 1 %
HCT: 34.7 % — ABNORMAL LOW (ref 39.0–52.0)
Hemoglobin: 11.2 g/dL — ABNORMAL LOW (ref 13.0–17.0)
Immature Granulocytes: 1 %
Lymphocytes Relative: 9 %
Lymphs Abs: 0.7 10*3/uL (ref 0.7–4.0)
MCH: 30.2 pg (ref 26.0–34.0)
MCHC: 32.3 g/dL (ref 30.0–36.0)
MCV: 93.5 fL (ref 78.0–100.0)
Monocytes Absolute: 0.9 10*3/uL (ref 0.1–1.0)
Monocytes Relative: 11 %
Neutro Abs: 6.4 10*3/uL (ref 1.7–7.7)
Neutrophils Relative %: 78 %
Platelets: 84 10*3/uL — ABNORMAL LOW (ref 150–400)
RBC: 3.71 MIL/uL — ABNORMAL LOW (ref 4.22–5.81)
RDW: 13.8 % (ref 11.5–15.5)
WBC: 8.1 10*3/uL (ref 4.0–10.5)

## 2017-12-27 LAB — BLOOD CULTURE ID PANEL (REFLEXED)

## 2017-12-27 LAB — BASIC METABOLIC PANEL
Anion gap: 5 (ref 5–15)
BUN: 17 mg/dL (ref 8–23)
CO2: 22 mmol/L (ref 22–32)
Calcium: 8.3 mg/dL — ABNORMAL LOW (ref 8.9–10.3)
Chloride: 108 mmol/L (ref 98–111)
Creatinine, Ser: 1.31 mg/dL — ABNORMAL HIGH (ref 0.61–1.24)
GFR calc Af Amer: 54 mL/min — ABNORMAL LOW (ref >60)
GFR calc non Af Amer: 47 mL/min — ABNORMAL LOW (ref >60)
Glucose, Bld: 119 mg/dL — ABNORMAL HIGH (ref 70–99)
Potassium: 4.2 mmol/L (ref 3.5–5.1)
Sodium: 135 mmol/L (ref 135–145)

## 2017-12-27 NOTE — Progress Notes (Signed)
PROGRESS NOTE [facility]   DESHON HSIAO   WUJ:[id number] DOB: December 24, 1927  DOA: 12/25/2017 PCP: Marletta Lor, MD   Brief Narrative:  Ronald [name] is an 84 year old male with PMH suprapubic catheter, HTN, HLD, CAD, CABG, dCHF, CKD3, prostate cancer, dementia, and pAF on Eliquis. Patient acutely developed severe fever and chills two nights ago and was admitted to the hospital with working diagnosis of sepsis due to complicated UTI, in the setting of suprapubic cystostomy catheter.   Subjective: Patient continues to improve and his HA from yesterday has resolved. Patient denies any SOB, orthopnea, N/V, abdominal pain, fever, or chills. Patient has a good appetite and is tolerating PO well.    Assessment & Plan:   Sepsis due to UTI associated with cystostomy catheter Patient was tachypneic in the ED with a temperature of 102.9. Lab work revealed WBC 14.3, lactic acid 2.26, and a positive urine analysis. Urine culture reveals > 100,000 gram (-) rods, but is still pending. LA has since resolved (1.24) and patient has not been febrile or tachypneic since admission to inpatient. Suprapubic catheter was changed yesterday. Continue to hold IVF to prevent fluid overload. Continue IV meropenem. Renal US demonstrated no hydronephrosis or acute findings. WBC has improved from 17.6 to 8.1.   Chronic kidney disease, stage III Creatinine elevated at 1.31, GFR down to 47. Continue to monitor and avoid nephrotoxic medications.  Chronic dCHF No signs of exacerbation, continue to hold IVF.   Hyperlipidemia  Continue Lipitor.  Coronary atherosclerosis Patient does not report any chest pain or SOB. Continue home medications - Lipitor, NTG PRN.  Paroxysmal atrial fibrillation CHA2DS2-VASc Score is 5. Continue Eliquis.    DVT prophylaxis: Eliquis Code Status: Full-code Family Communication: None at bedside Disposition Plan: Will continue to await urine culture results before  discharge   Consultants:   None  Procedures:   None  Antimicrobials:  Meropenem   Objective: Vitals:   12/26/17 0525 12/26/17 1328 12/26/17 2139 12/27/17 0444  BP: (!) 96/44 (!) 105/50 (!) 111/46 (!) 136/54  Pulse: (!) 59 (!) 56 (!) 59 (!) 56  Resp: 16 (!) 22 18 18   Temp: 98.3 F (36.8 C) 98.7 F (37.1 C) 98.7 F (37.1 C) 98.9 F (37.2 C)  TempSrc: Oral Oral Oral Oral  SpO2: 97% 99% 95% 94%  Weight:      Height:        Intake/Output Summary (Last 24 hours) at 12/27/2017 0957 Last data filed at 12/27/2017 0936 Gross per 24 hour  Intake -  Output 800 ml  Net -800 ml   Filed Weights   12/26/17 0012  Weight: 71.3 kg    Examination:  General exam: Appears calm and comfortable  Respiratory system: Clear to auscultation. No wheezes, crackles, or rhonchi Cardiovascular system: S1 & S2 heard, RRR. No JVD, murmurs, rubs or gallops Gastrointestinal system: Abdomen is nondistended, soft and nontender. No organomegaly or masses felt. Normal bowel sounds heard. Cystostomy catheter insertion site clean. Central nervous system: Alert and oriented. No focal neurological deficits. Extremities: No pedal edema. Symmetric, strength 5/5   Skin: No rashes, lesions or ulcers Psychiatry: Judgment and insight appear normal. Mood & affect appropriate.    Data Reviewed: I have personally reviewed following labs and imaging studies  CBC: Recent Labs  Lab 12/25/17 2342 12/26/17 0001 12/26/17 0349 12/27/17 0316  WBC 14.3*  --  17.6* 8.1  NEUTROABS 13.5*  --   --  6.4  HGB 12.4* 12.6* 11.5* 11.2*  HCT  38.5* 37.0* 35.7* 34.7*  MCV 92.5  --  93.9 93.5  PLT 112*  --  102* 84*   Basic Metabolic Panel: Recent Labs  Lab 12/25/17 2342 12/26/17 0001 12/26/17 0349 12/27/17 0316  NA 138 142 139 135  K 3.5 3.7 3.7 4.2  CL 111 105 114* 108  CO2 21*  --  22 22  GLUCOSE 137* 150* 151* 119*  BUN 23 24* 20 17  CREATININE 1.25* 1.20 1.18 1.31*  CALCIUM 8.6*  --  7.9* 8.3*    GFR: Estimated Creatinine Clearance: 33.9 mL/min (A) (by C-G formula based on SCr of 1.31 mg/dL (H)). Liver Function Tests: Recent Labs  Lab 12/25/17 2342  AST 44*  ALT 27  ALKPHOS 70  BILITOT 0.9  PROT 5.6*  ALBUMIN 3.3*   No results for input(s): LIPASE, AMYLASE in the last 168 hours. No results for input(s): AMMONIA in the last 168 hours. Coagulation Profile: No results for input(s): INR, PROTIME in the last 168 hours. Cardiac Enzymes: No results for input(s): CKTOTAL, CKMB, CKMBINDEX, TROPONINI in the last 168 hours. BNP (last 3 results) No results for input(s): PROBNP in the last 8760 hours. HbA1C: No results for input(s): HGBA1C in the last 72 hours. CBG: No results for input(s): GLUCAP in the last 168 hours. Lipid Profile: No results for input(s): CHOL, HDL, LDLCALC, TRIG, CHOLHDL, LDLDIRECT in the last 72 hours. Thyroid Function Tests: No results for input(s): TSH, T4TOTAL, FREET4, T3FREE, THYROIDAB in the last 72 hours. Anemia Panel: No results for input(s): VITAMINB12, FOLATE, FERRITIN, TIBC, IRON, RETICCTPCT in the last 72 hours. Sepsis Labs: Recent Labs  Lab 12/26/17 0002 12/26/17 0211 12/26/17 0349  PROCALCITON  --   --  8.15  LATICACIDVEN 2.26* 1.24  --     Recent Results (from the past 240 hour(s))  Blood Culture (routine x 2)     Status: None (Preliminary result)   Collection Time: 12/25/17 11:43 PM  Result Value Ref Range Status   Specimen Description BLOOD RIGHT WRIST  Final   Special Requests   Final    BOTTLES DRAWN AEROBIC AND ANAEROBIC Blood Culture results may not be optimal due to an excessive volume of blood received in culture bottles   Culture   Final    NO GROWTH 1 DAY Performed at [facility], [place] [address], [place], [place] [address]    Report Status PENDING  Incomplete  Blood Culture (routine x 2)     Status: None (Preliminary result)   Collection Time: 12/25/17 11:53 PM  Result Value Ref Range Status   Specimen  Description BLOOD RIGHT ANTECUBITAL  Final   Special Requests   Final    BOTTLES DRAWN AEROBIC AND ANAEROBIC Blood Culture results may not be optimal due to an excessive volume of blood received in culture bottles   Culture   Final    NO GROWTH 1 DAY Performed at [facility], [place] [address] [place], [place] [address]    Report Status PENDING  Incomplete  Urine culture     Status: Abnormal (Preliminary result)   Collection Time: 12/25/17 11:54 PM  Result Value Ref Range Status   Specimen Description URINE, SUPRAPUBIC  Final   Special Requests   Final    NONE Performed at [facility], [place] [address], [place],  [address]    Culture >=100,000 COLONIES/mL GRAM NEGATIVE RODS (A)  Final   Report Status PENDING  Incomplete      Radiology  Studies: Dg Chest 2 View  Result Date: 12/25/2017 CLINICAL DATA:  Fever. EXAM: CHEST - 2 VIEW COMPARISON:  Radiographs 05/11/2017 FINDINGS: Lung volumes are low. Patient is post median sternotomy and CABG. Unchanged heart size and mediastinal contours. No confluent airspace disease. No pulmonary edema, large pleural effusion or pneumothorax. Osseous structures are unchanged, spine evaluation on the lateral view limited by overlying artifact. IMPRESSION: Low lung volumes.  No acute findings.  No evidence of pneumonia. Electronically Signed   By: Keith Rake M.D.   On: 12/25/2017 23:32   US Renal  Result Date: 12/26/2017 CLINICAL DATA:  Urinary tract infection, chronic kidney disease stage 3, hypertension EXAM: RENAL / URINARY TRACT ULTRASOUND COMPLETE COMPARISON:  02/19/2017 FINDINGS: Right Kidney: Length: 12.4 cm. Right upper pole anechoic simple renal cyst measures 5.3 x 4.8 x 5.0 cm. Mild increased echogenicity and cortical thinning. No hydronephrosis or acute process. Left Kidney: Length: 12.1 cm. Mild increased echogenicity and cortical thinning. Small lower pole cortical hypoechoic cyst measures 12 mm. Nonobstructing echogenic  shadowing calculus in mid to upper pole measuring 6 mm. Bladder: Collapsed by suprapubic catheter. IMPRESSION: No acute finding or hydronephrosis. Increased echogenicity and cortical thinning compatible with chronic medical renal disease. Renal cysts Suspect nonobstructing subcentimeter left nephrolithiasis Electronically Signed   By: Jerilynn Mages.  Shick M.D.   On: 12/26/2017 22:54      Scheduled Meds: . apixaban  5 mg Oral BID  . atorvastatin  40 mg Oral QPM  . famotidine  20 mg Oral BID  . galantamine  8 mg Oral Q breakfast  . hydroxypropyl methylcellulose / hypromellose  1 drop Both Eyes BID  . multivitamin with minerals  1 tablet Oral Daily   Continuous Infusions: . meropenem (MERREM) IV 1 g (12/26/17 2305)     LOS: 1 day    Time spent: Total of 30 minutes spent with pt, greater than 50% of which was spent in discussion of  treatment, counseling and coordination of care    Krista Blue, PA-S Pager: Text Page via [email or web address]   If 7PM-7AM, please contact night-coverage [email or web address] 12/27/2017, 9:57 AM   Note - This record has been created using [employer]. Chart creation errors have been sought, but may not always have been located. Such creation errors do not reflect on the standard of medical care.

## 2017-12-27 NOTE — Progress Notes (Signed)
PHARMACY - PHYSICIAN COMMUNICATION CRITICAL VALUE ALERT - BLOOD CULTURE IDENTIFICATION (BCID)  Ronald [name] is an 84 y.o. male who presented to [facility] on 12/25/2017 with a chief complaint of fever and chills.   Assessment:  Sepsis due to UTI associated with cystostomy catheter.  One of 4 bottle grew Pseudomonas (preliminary result).  Pseudomonas in blood culture in 2017 was intermediate to cefepime and resistant to Fortaz, sensitive to carbapenem.  Current urine culture grew GNR, identification and susceptibility pending.  Name of physician (or Provider) Contacted: Raliegh Ip Schorr  Current antibiotics: Merrem  Changes to prescribed antibiotics recommended:  Patient is on recommended antibiotics - No changes needed  Results for orders placed or performed during the hospital encounter of 12/25/17  Blood Culture ID Panel (Reflexed) (Collected: 12/25/2017 11:53 PM)  Result Value Ref Range   Enterococcus species NOT DETECTED NOT DETECTED   Listeria monocytogenes NOT DETECTED NOT DETECTED   Staphylococcus species NOT DETECTED NOT DETECTED   Staphylococcus aureus NOT DETECTED NOT DETECTED   Streptococcus species NOT DETECTED NOT DETECTED   Streptococcus agalactiae NOT DETECTED NOT DETECTED   Streptococcus pneumoniae NOT DETECTED NOT DETECTED   Streptococcus pyogenes NOT DETECTED NOT DETECTED   Acinetobacter baumannii NOT DETECTED NOT DETECTED   Enterobacteriaceae species NOT DETECTED NOT DETECTED   Enterobacter cloacae complex NOT DETECTED NOT DETECTED   Escherichia coli NOT DETECTED NOT DETECTED   Klebsiella oxytoca NOT DETECTED NOT DETECTED   Klebsiella pneumoniae NOT DETECTED NOT DETECTED   Proteus species NOT DETECTED NOT DETECTED   Serratia marcescens NOT DETECTED NOT DETECTED   Carbapenem resistance NOT DETECTED NOT DETECTED   Haemophilus influenzae NOT DETECTED NOT DETECTED   Neisseria meningitidis NOT DETECTED NOT DETECTED   Pseudomonas aeruginosa DETECTED (A) NOT DETECTED   Candida albicans NOT DETECTED NOT DETECTED   Candida glabrata NOT DETECTED NOT DETECTED   Candida krusei NOT DETECTED NOT DETECTED   Candida parapsilosis NOT DETECTED NOT DETECTED   Candida tropicalis NOT DETECTED NOT DETECTED     Natausha Jungwirth D. Mina Marble, PharmD, BCPS, [place] 12/27/2017, 7:54 PM

## 2017-12-27 NOTE — Evaluation (Signed)
Physical Therapy Evaluation Patient Details Name: Ronald [name] MRN: [id number] DOB: 08/17/1927 Today's Date: 12/27/2017   History of Present Illness  Pt is a 84 y.o. M with significant PMH of s/p suprapubic catheter placement, hypertension, hyperlipidemia, CAD, CABG, CHF, dementia, CKD-3, prostate cancer, PAF on Eliquis who presents with fever and chills. Working diagnosis sepsis due to UTI associated with cystostomy catheter.  Clinical Impression  Pt admitted with above diagnosis. Pt currently with functional limitations due to the deficits listed below (see PT Problem List). Prior to admission, patient independent with ADL's and uses SPC occasionally for mobility. Currently, patient presenting with decreased functional mobility secondary to balance impairments and decreased activity tolerance. Ambulating 300 feet with walker and min guard assistance.HR in 50's during mobility. Pt will benefit from skilled PT to increase their independence and safety with mobility to allow discharge to the venue listed below.       Follow Up Recommendations Home health PT    Equipment Recommendations  None recommended by PT    Recommendations for Other Services       Precautions / Restrictions Precautions Precautions: Fall Restrictions Weight Bearing Restrictions: No      Mobility  Bed Mobility Overal bed mobility: Modified Independent                Transfers Overall transfer level: Needs assistance Equipment used: Rolling walker (2 wheeled) Transfers: Sit to/from Stand Sit to Stand: Supervision            Ambulation/Gait Ambulation/Gait assistance: Min guard Gait Distance (Feet): 300 Feet Assistive device: Rolling walker (2 wheeled) Gait Pattern/deviations: Step-through pattern;Decreased dorsiflexion - right;Decreased dorsiflexion - left;Trunk flexed Gait velocity: decr   General Gait Details: Frequent cueing for walker proximity to promote upright posture. Patient with  slightly decreased ability to control forward momentum when walking  Stairs            Wheelchair Mobility    Modified Rankin (Stroke Patients Only)       Balance Overall balance assessment: Mild deficits observed, not formally tested                                           Pertinent Vitals/Pain Pain Assessment: No/denies pain    Home Living Family/patient expects to be discharged to:: Private residence Living Arrangements: Spouse/significant other Available Help at Discharge: Family Type of Home: House Home Access: Stairs to enter Entrance Stairs-Rails: Left Entrance Stairs-Number of Steps: 3 Home Layout: One level Home Equipment: Walker - 2 wheels;Cane - single point      Prior Function Level of Independence: Independent with assistive device(s)         Comments: SPC for household distances     Hand Dominance   Dominant Hand: Right    Extremity/Trunk Assessment   Upper Extremity Assessment Upper Extremity Assessment: Overall WFL for tasks assessed    Lower Extremity Assessment Lower Extremity Assessment: Overall WFL for tasks assessed    Cervical / Trunk Assessment Cervical / Trunk Assessment: Kyphotic  Communication   Communication: HOH  Cognition Arousal/Alertness: Awake/alert Behavior During Therapy: WFL for tasks assessed/performed Overall Cognitive Status: Within Functional Limits for tasks assessed                                 General Comments: Very pleasant and willing to  participate in therapy evalution. 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     Assessment/Plan    PT Assessment Patient needs continued PT services  PT Problem List Decreased strength;Decreased activity tolerance;Decreased balance;Decreased mobility       PT Treatment Interventions DME instruction;Gait training;Stair training;Functional mobility  training;Therapeutic activities;Therapeutic exercise;Balance training;Patient/family education    PT Goals (Current goals can be found in the Care Plan section)  Acute Rehab PT Goals Patient Stated Goal: "walk with this walker until I get stronger." PT Goal Formulation: With patient Time For Goal Achievement: 01/10/18 Potential to Achieve Goals: Good    Frequency Min 3X/week   Barriers to discharge        Co-evaluation               AM-PAC PT "6 Clicks" Daily Activity  Outcome Measure Difficulty turning over in bed (including adjusting bedclothes, sheets and blankets)?: None Difficulty moving from lying on back to sitting on the side of the bed? : None Difficulty sitting down on and standing up from a chair with arms (e.g., wheelchair, bedside commode, etc,.)?: None Help needed moving to and from a bed to chair (including a wheelchair)?: A Little Help needed walking in hospital room?: A Little Help needed climbing 3-5 steps with a railing? : A Little 6 Click Score: 21    End of Session Equipment Utilized During Treatment: Gait belt Activity Tolerance: Patient tolerated treatment well Patient left: in bed;with call bell/phone within reach;with bed alarm set Nurse Communication: Mobility status PT Visit Diagnosis: Unsteadiness on feet (R26.81);Difficulty in walking, not elsewhere classified (R26.2)    Time: 3953-2023 PT Time Calculation (min) (ACUTE ONLY): 17 min   Charges:   PT Evaluation $PT Eval Moderate Complexity: 1 Mod          Ellamae Sia, [place], DPT Acute Rehabilitation Services Pager [phone] Office [phone]   Willy Eddy 12/27/2017, 3:46 PM

## 2017-12-28 ENCOUNTER — Inpatient Hospital Stay (HOSPITAL_COMMUNITY): Payer: Medicare Other

## 2017-12-28 DIAGNOSIS — K219 Gastro-esophageal reflux disease without esophagitis: Secondary | ICD-10-CM

## 2017-12-28 MED ORDER — INFLUENZA VAC SPLIT HIGH-DOSE 0.5 ML IM SUSY: 0.5000 mL | PREFILLED_SYRINGE | INTRAMUSCULAR | Status: DC | PRN

## 2017-12-28 NOTE — Progress Notes (Signed)
PROGRESS NOTE [facility]   Ronald [name]   MLJ:[id number] DOB: 07-07-1927  DOA: 12/25/2017 PCP: Marletta Lor, MD   Brief Narrative:  Ronald [name] is an 84 year old male with PMH suprapubic catheter, HTN, HLD, CAD, CABG, dCHF, CKD3, prostate cancer, dementia, and pAF on Eliquis. Patient acutely developed severe fever and chills two nights ago and was admitted to the hospital with working diagnosis of sepsis due to complicated UTI, in the setting of suprapubic cystostomy catheter. Blood cultures detected pseudomonas aeruginosa, sensitive to his current antibiotic therapy.   Subjective: Patient reports that he is feeling well, no complaints. Tolerating PO well. No overnight issues. Denies any abdominal pain, nausea, vomiting, orthopnea, SOB, or headache.    Assessment & Plan:   Sepsis due to UTI associated with cystostomy catheter Patient presented to ED tachypneic, febrile T 102.9, WBC 14.3, lactic acid 2.26, and a positive urine analysis. Leukocytosis and LA has since resolved and patient has not been febrile or tachypneic since admitted to inpatient and started on IV meropenem. Suprapubic catheter was changed 12/26/2017. Renal US demonstrated no hydronephrosis or acute findings. Urine culture revealed > 100,000 gram (-) rods, but organism ID still pending. Blood culture detected pseudomonas aeruginosa. Continue IV meropenem.  Chronic kidney disease, stage III Creatinine elevated at 1.31, GFR down to 47. Continue to monitor and avoid nephrotoxic medications.   Chronic dCHF No signs of exacerbation, continue to hold IVF.   Hyperlipidemia  Continue Lipitor.  Coronary atherosclerosis Patient does not report any chest pain or SOB. Continue home medications - Lipitor, NTG PRN.  Paroxysmal atrial fibrillation CHA2DS2-VASc Scoreis 5. Continue Eliquis.   DVT prophylaxis: Eliquis Code Status: Full-code Family Communication: None at bedside Disposition Plan:  Continuing to await final urine culture results before discharging home   Consultants:   None  Procedures:   None  Antimicrobials:  Meropenem  Objective: Vitals:   12/27/17 1445 12/27/17 2015 12/28/17 0458 12/28/17 0500  BP: (!) 137/57 (!) 130/57 (!) 126/57 (!) 136/58  Pulse: (!) 49 (!) 51  (!) 43  Resp: 18 18 18 18   Temp: 97.6 F (36.4 C) 97.6 F (36.4 C) 97.7 F (36.5 C) 97.8 F (36.6 C)  TempSrc: Oral Oral Oral   SpO2: 95% 97%  96%  Weight:      Height:        Intake/Output Summary (Last 24 hours) at 12/28/2017 1009 Last data filed at 12/28/2017 0800 Gross per 24 hour  Intake -  Output 1500 ml  Net -1500 ml   Filed Weights   12/26/17 0012  Weight: 71.3 kg    Examination:  General exam: Appears calm and comfortable  Respiratory system: Clear to auscultation. No wheezes, crackles, or rhonchi Cardiovascular system: S1 & S2 heard, RRR. No JVD, murmurs, rubs or gallops Gastrointestinal system: Abdomen is nondistended, soft and nontender. No organomegaly or masses felt. Normal bowel sounds heard. Cystostomy catheter insertion site clean. Central nervous system: Alert and oriented. No focal neurological deficits. Extremities: No pedal edema. Symmetric, strength 5/5   Skin: No rashes, lesions or ulcers Psychiatry: Judgment and insight appear normal. Mood & affect appropriate.    Data Reviewed: I have personally reviewed following labs and imaging studies  CBC: Recent Labs  Lab 12/25/17 2342 12/26/17 0001 12/26/17 0349 12/27/17 0316  WBC 14.3*  --  17.6* 8.1  NEUTROABS 13.5*  --   --  6.4  HGB 12.4* 12.6* 11.5* 11.2*  HCT 38.5* 37.0* 35.7* 34.7*  MCV 92.5  --  93.9 93.5  PLT 112*  --  102* 84*   Basic Metabolic Panel: Recent Labs  Lab 12/25/17 2342 12/26/17 0001 12/26/17 0349 12/27/17 0316  NA 138 142 139 135  K 3.5 3.7 3.7 4.2  CL 111 105 114* 108  CO2 21*  --  22 22  GLUCOSE 137* 150* 151* 119*  BUN 23 24* 20 17  CREATININE 1.25* 1.20  1.18 1.31*  CALCIUM 8.6*  --  7.9* 8.3*   GFR: Estimated Creatinine Clearance: 33.9 mL/min (A) (by C-G formula based on SCr of 1.31 mg/dL (H)). Liver Function Tests: Recent Labs  Lab 12/25/17 2342  AST 44*  ALT 27  ALKPHOS 70  BILITOT 0.9  PROT 5.6*  ALBUMIN 3.3*   No results for input(s): LIPASE, AMYLASE in the last 168 hours. No results for input(s): AMMONIA in the last 168 hours. Coagulation Profile: No results for input(s): INR, PROTIME in the last 168 hours. Cardiac Enzymes: No results for input(s): CKTOTAL, CKMB, CKMBINDEX, TROPONINI in the last 168 hours. BNP (last 3 results) No results for input(s): PROBNP in the last 8760 hours. HbA1C: No results for input(s): HGBA1C in the last 72 hours. CBG: No results for input(s): GLUCAP in the last 168 hours. Lipid Profile: No results for input(s): CHOL, HDL, LDLCALC, TRIG, CHOLHDL, LDLDIRECT in the last 72 hours. Thyroid Function Tests: No results for input(s): TSH, T4TOTAL, FREET4, T3FREE, THYROIDAB in the last 72 hours. Anemia Panel: No results for input(s): VITAMINB12, FOLATE, FERRITIN, TIBC, IRON, RETICCTPCT in the last 72 hours. Sepsis Labs: Recent Labs  Lab 12/26/17 0002 12/26/17 0211 12/26/17 0349  PROCALCITON  --   --  8.15  LATICACIDVEN 2.26* 1.24  --     Recent Results (from the past 240 hour(s))  Blood Culture (routine x 2)     Status: None (Preliminary result)   Collection Time: 12/25/17 11:43 PM  Result Value Ref Range Status   Specimen Description BLOOD RIGHT WRIST  Final   Special Requests   Final    BOTTLES DRAWN AEROBIC AND ANAEROBIC Blood Culture results may not be optimal due to an excessive volume of blood received in culture bottles   Culture  Setup Time   Final    GRAM NEGATIVE RODS AEROBIC BOTTLE ONLY CRITICAL VALUE NOTED.  VALUE IS CONSISTENT WITH PREVIOUSLY REPORTED AND CALLED VALUE. Performed at [facility], [place] [address], [place], [place] [address]    Culture GRAM NEGATIVE RODS   Final   Report Status PENDING  Incomplete  Blood Culture (routine x 2)     Status: None (Preliminary result)   Collection Time: 12/25/17 11:53 PM  Result Value Ref Range Status   Specimen Description BLOOD RIGHT ANTECUBITAL  Final   Special Requests   Final    BOTTLES DRAWN AEROBIC AND ANAEROBIC Blood Culture results may not be optimal due to an excessive volume of blood received in culture bottles   Culture  Setup Time   Final    GRAM NEGATIVE RODS AEROBIC BOTTLE ONLY CRITICAL RESULT CALLED TO, READ BACK BY AND VERIFIED WITH: PHARMD T DANG [phone] MLM Performed at [facility], [place] [address], [place], [place] [address]    Culture GRAM NEGATIVE RODS  Final   Report Status PENDING  Incomplete  Blood Culture ID Panel (Reflexed)     Status: Abnormal   Collection Time: 12/25/17 11:53 PM  Result Value Ref Range Status   Enterococcus species NOT DETECTED NOT DETECTED Final  Listeria monocytogenes NOT DETECTED NOT DETECTED Final   Staphylococcus species NOT DETECTED NOT DETECTED Final   Staphylococcus aureus NOT DETECTED NOT DETECTED Final   Streptococcus species NOT DETECTED NOT DETECTED Final   Streptococcus agalactiae NOT DETECTED NOT DETECTED Final   Streptococcus pneumoniae NOT DETECTED NOT DETECTED Final   Streptococcus pyogenes NOT DETECTED NOT DETECTED Final   Acinetobacter baumannii NOT DETECTED NOT DETECTED Final   Enterobacteriaceae species NOT DETECTED NOT DETECTED Final   Enterobacter cloacae complex NOT DETECTED NOT DETECTED Final   Escherichia coli NOT DETECTED NOT DETECTED Final   Klebsiella oxytoca NOT DETECTED NOT DETECTED Final   Klebsiella pneumoniae NOT DETECTED NOT DETECTED Final   Proteus species NOT DETECTED NOT DETECTED Final   Serratia marcescens NOT DETECTED NOT DETECTED Final   Carbapenem resistance NOT DETECTED NOT DETECTED Final   Haemophilus influenzae NOT DETECTED NOT DETECTED Final   Neisseria meningitidis NOT DETECTED NOT DETECTED Final    Pseudomonas aeruginosa DETECTED (A) NOT DETECTED Final    Comment: CRITICAL RESULT CALLED TO, READ BACK BY AND VERIFIED WITH: PHARMD T DANG [phone] MLM    Candida albicans NOT DETECTED NOT DETECTED Final   Candida glabrata NOT DETECTED NOT DETECTED Final   Candida krusei NOT DETECTED NOT DETECTED Final   Candida parapsilosis NOT DETECTED NOT DETECTED Final   Candida tropicalis NOT DETECTED NOT DETECTED Final    Comment: Performed at [facility], [address], [place], [place] [address]  Urine culture     Status: Abnormal (Preliminary result)   Collection Time: 12/25/17 11:54 PM  Result Value Ref Range Status   Specimen Description URINE, SUPRAPUBIC  Final   Special Requests NONE  Final   Culture (A)  Final    >=100,000 COLONIES/mL GRAM NEGATIVE RODS CULTURE REINCUBATED FOR BETTER GROWTH Performed at [facility] [address] [place], [place] [address]    Report Status PENDING  Incomplete      Radiology Studies: US Renal  Result Date: 12/26/2017 CLINICAL DATA:  Urinary tract infection, chronic kidney disease stage 3, hypertension EXAM: RENAL / URINARY TRACT ULTRASOUND COMPLETE COMPARISON:  02/19/2017 FINDINGS: Right Kidney: Length: 12.4 cm. Right upper pole anechoic simple renal cyst measures 5.3 x 4.8 x 5.0 cm. Mild increased echogenicity and cortical thinning. No hydronephrosis or acute process. Left Kidney: Length: 12.1 cm. Mild increased echogenicity and cortical thinning. Small lower pole cortical hypoechoic cyst measures 12 mm. Nonobstructing echogenic shadowing calculus in mid to upper pole measuring 6 mm. Bladder: Collapsed by suprapubic catheter. IMPRESSION: No acute finding or hydronephrosis. Increased echogenicity and cortical thinning compatible with chronic medical renal disease. Renal cysts Suspect nonobstructing subcentimeter left nephrolithiasis Electronically Signed   By: Jerilynn Mages.  Shick M.D.   On: 12/26/2017 22:54      Scheduled Meds: . apixaban  5  mg Oral BID  . atorvastatin  40 mg Oral QPM  . famotidine  20 mg Oral BID  . galantamine  8 mg Oral Q breakfast  . hydroxypropyl methylcellulose / hypromellose  1 drop Both Eyes BID  . multivitamin with minerals  1 tablet Oral Daily   Continuous Infusions: . meropenem (MERREM) IV 1 g (12/27/17 2322)     LOS: 2 days    Time spent: Total of 30 minutes spent with pt, greater than 50% of which was spent in discussion of  treatment, counseling and coordination of care    Krista Blue, PA-S Pager: Text Page via [email or web address]   If 7PM-7AM, please  contact night-coverage [email or web address] 12/28/2017, 10:09 AM   Note - This record has been created using [employer]. Chart creation errors have been sought, but may not always have been located. Such creation errors do not reflect on the standard of medical care.

## 2017-12-28 NOTE — Plan of Care (Signed)

## 2017-12-28 NOTE — Progress Notes (Signed)
Physical Therapy Treatment Patient Details Name: Ronald [name] MRN: [id number] DOB: [birth year] Today's Date: 12/28/2017    History of Present Illness Pt is a 84 y.o. male admitted 12/25/17 with fever and chills; worked up for sepsis. Found to have gram-negative bacteremia in the setting of complicated UTI. PMH includes HTN, HLD, CAD, CABG, CHF, dementia, CKD-3, prostate cancer, PAF on Eliquis.   PT Comments    Pt progressing with mobility. Able to ambulate with RW and ascend/descend steps with rail support at supervision-level; intermittent cues to maintain safe proximity to RW. Although pt frequently uses SPC at home, reports he is more comfortable to continue using RW and will plan to use RW at home. Encouraged daily ambulation as tolerated with supervision from hallway staff. Will continue to follow acutely.    Follow Up Recommendations  Home health PT;Supervision for mobility/OOB     Equipment Recommendations  None recommended by PT    Recommendations for Other Services       Precautions / Restrictions Precautions Precautions: Fall Restrictions Weight Bearing Restrictions: No    Mobility  Bed Mobility               General bed mobility comments: Received sitting in recliner  Transfers Overall transfer level: Modified independent Equipment used: Rolling walker (2 wheeled) Transfers: Sit to/from Stand              Ambulation/Gait Ambulation/Gait assistance: Supervision Gait Distance (Feet): 350 Feet Assistive device: Rolling walker (2 wheeled) Gait Pattern/deviations: Step-through pattern;Decreased dorsiflexion - right;Decreased dorsiflexion - left;Trunk flexed;Decreased stride length Gait velocity: Decreased Gait velocity interpretation: 1.31 - 2.62 ft/sec, indicative of limited community ambulator General Gait Details: Frequent cues to maintain proximity to RW, especially with turns as pt tends to step outside of it. Pt able to intermittently  self-correct   Stairs Stairs: Yes Stairs assistance: Supervision Stair Management: One rail Right;Step to pattern;Forwards Number of Stairs: 5 General stair comments: Ascend/descended 5 steps with single UE support on rail; supervision for safety   Wheelchair Mobility    Modified Rankin (Stroke Patients Only)       Balance Overall balance assessment: Needs assistance   Sitting balance-Leahy Scale: Good       Standing balance-Leahy Scale: Fair Standing balance comment: Can stand and take steps without UE support                            Cognition Arousal/Alertness: Awake/alert Behavior During Therapy: WFL for tasks assessed/performed Overall Cognitive Status: History of cognitive impairments - at baseline Area of Impairment: Attention;Safety/judgement;Problem solving                   Current Attention Level: Selective     Safety/Judgement: Decreased awareness of safety   Problem Solving: Requires verbal cues General Comments: Likely baseline cognition. Very pleasant      Exercises      General Comments General comments (skin integrity, edema, etc.): Good management of catheter      Pertinent Vitals/Pain Pain Assessment: No/denies pain    Home Living                      Prior Function            PT Goals (current goals can now be found in the care plan section) Acute Rehab PT Goals Patient Stated Goal: "walk with this walker until I get stronger." PT Goal Formulation: With patient Time  For Goal Achievement: 01/10/18 Potential to Achieve Goals: Good Progress towards PT goals: Progressing toward goals    Frequency    Min 3X/week      PT Plan      Co-evaluation              AM-PAC PT "6 Clicks" Daily Activity  Outcome Measure  Difficulty turning over in bed (including adjusting bedclothes, sheets and blankets)?: None Difficulty moving from lying on back to sitting on the side of the bed? :  None Difficulty sitting down on and standing up from a chair with arms (e.g., wheelchair, bedside commode, etc,.)?: None Help needed moving to and from a bed to chair (including a wheelchair)?: A Little Help needed walking in hospital room?: A Little Help needed climbing 3-5 steps with a railing? : A Little 6 Click Score: 21    End of Session Equipment Utilized During Treatment: Gait belt Activity Tolerance: Patient tolerated treatment well Patient left: in chair;with call bell/phone within reach;with chair alarm set   PT Visit Diagnosis: Unsteadiness on feet (R26.81);Difficulty in walking, not elsewhere classified (R26.2)     Time: 7096-2836 PT Time Calculation (min) (ACUTE ONLY): 18 min  Charges:  $Gait Training: 8-22 mins                     Mabeline Caras, PT, DPT Acute Rehabilitation Services  Pager [phone] Office [place] 12/28/2017, 4:56 PM

## 2017-12-29 DIAGNOSIS — I480 Paroxysmal atrial fibrillation: Secondary | ICD-10-CM

## 2017-12-29 DIAGNOSIS — R7881 Bacteremia: Secondary | ICD-10-CM

## 2017-12-29 LAB — BASIC METABOLIC PANEL
Anion gap: 9 (ref 5–15)
BUN: 19 mg/dL (ref 8–23)
CO2: 22 mmol/L (ref 22–32)
Calcium: 8.5 mg/dL — ABNORMAL LOW (ref 8.9–10.3)
Chloride: 109 mmol/L (ref 98–111)
Creatinine, Ser: 1.03 mg/dL (ref 0.61–1.24)
GFR calc Af Amer: >60 mL/min (ref >60)
GFR calc non Af Amer: >60 mL/min (ref >60)
Glucose, Bld: 108 mg/dL — ABNORMAL HIGH (ref 70–99)
Potassium: 3.6 mmol/L (ref 3.5–5.1)
Sodium: 140 mmol/L (ref 135–145)

## 2017-12-29 LAB — CULTURE, BLOOD (ROUTINE X 2)

## 2017-12-29 LAB — URINE CULTURE: Culture: >=100000 — AB

## 2017-12-29 LAB — CBC
HCT: 37.2 % — ABNORMAL LOW (ref 39.0–52.0)
Hemoglobin: 12.2 g/dL — ABNORMAL LOW (ref 13.0–17.0)
MCH: 30 pg (ref 26.0–34.0)
MCHC: 32.8 g/dL (ref 30.0–36.0)
MCV: 91.4 fL (ref 78.0–100.0)
Platelets: 90 10*3/uL — ABNORMAL LOW (ref 150–400)
RBC: 4.07 MIL/uL — ABNORMAL LOW (ref 4.22–5.81)
RDW: 13.3 % (ref 11.5–15.5)
WBC: 5.4 10*3/uL (ref 4.0–10.5)

## 2017-12-29 MED ORDER — CIPROFLOXACIN HCL 500 MG PO TABS: 500.0000 mg | ORAL_TABLET | Freq: Two times a day (BID) | ORAL | 0 refills | Status: AC

## 2017-12-29 MED ORDER — CIPROFLOXACIN HCL 500 MG PO TABS: 500.0000 mg | ORAL_TABLET | Freq: Two times a day (BID) | ORAL | Status: DC

## 2017-12-29 MED FILL — CIPROFLOXACIN HCL 500 MG TA: 500 | 11 days supply | Qty: 22 | Fill #0

## 2017-12-29 NOTE — Care Management Note (Signed)
Case Management Note  Patient Details  Name: Ronald [name] MRN: [id number] Date of Birth: [birth year]  Subjective/Objective:  Sepsis / UTI. Hx   s/p suprapubic catheter placement, hypertension, hyperlipidemia, CAD, CABG, CHF, dementia, CKD-3, prostate cancer, PAF/ Eliquis.               Marvene Staff (Spouse) Marinda Elk. (Son)     [phone]     PCP: Bluford Kaufmann  Action/Plan: Transition to home with home health services.  Pt with transportation to home.  Expected Discharge Date:  12/29/17               Expected Discharge Plan:  [place]  In-House Referral:     Discharge planning Services  CM Consult  Post Acute Care Choice:    Choice offered to:  Patient  DME Arranged:   N/A DME Agency:   N/A  HH Arranged:  RN, PT, OT [place] Agency:  [facility]  Status of Service:  Completed, signed off  If discussed at [place] of Stay Meetings, dates discussed:    Additional Comments:  Sharin Mons, RN 12/29/2017, 10:21 AM

## 2017-12-29 NOTE — Discharge Summary (Signed)
PATIENT DETAILS Name: Ronald [name] Age: 84 y.o. Sex: male Date of Birth: 1928/01/29 MRN: [id number]. Admitting Physician: Ivor Costa, MD NAT:[id number], Doretha Sou, MD  Admit Date: 12/25/2017 Discharge date: 12/29/2017  Recommendations for Outpatient Follow-up:  1. Follow up with PCP in 1-2 weeks 2. Please obtain BMP/CBC in one week 3. Please repeat blood cultures once patient completes a course of antibiotics to document resolution of bacteremia  Admitted From:  Home  Disposition: Home with home health services   Home Health:  Yes  Equipment/Devices: None  Discharge Condition: Stable  CODE STATUS: FULL CODE  Diet recommendation:  Heart Healthy / Carb Modified   Brief Summary: See H&P, Labs, Consult and Test reports for all details in brief, Patient is a 84 year old male with history of CAD status post CABG, A. fib on Eliquis, prostate CA status post radiation seeds and with a chronic suprapubic Foley catheter presented with fever and chills, found to have sepsis secondary to Pseudomonas UTI and bacteremia related to suprapubic catheter.   See below for further details  Brief Hospital Course: Sepsis secondary to Pseudomonas bacteremia and complicated Pseudomonas UTI associated with a suprapubic catheter: Sepsis pathophysiology has resolved, clinically improved-leukocytosis has resolved.  He was empirically treated with meropenem.  Blood and urine cultures were positive Pseudomonas, urine culture also grew out staph aureus.  At this time it is felt that the staph aureus is probably a contaminant and not a real infection.  Suprapubic catheter was exchanged/replaced on admission.  He will be transitioned to ciprofloxacin on discharge to complete a two-week course of antimicrobial therapy.  He was instructed to follow-up with his primary care practitioner to repeat blood cultures after he completes a course of antimicrobial therapy.  Chronic kidney disease stage III:  Creatinine close to usual baseline follow  Chronic diastolic heart failure: No signs of volume overload-follow  Dyslipidemia: Continue Lipitor  CAD status post CABG: No anginal symptoms  Chronic diastolic heart failure: Remains compensated-resume diuretics on discharge.  Paroxysmal atrial fibrillation:Sinus rhythm-continue Eliquis  Dementia:At baseline-very mildly confused if at all.  Continue Razadyne.  Thrombocytopenia: Appears to be chronic-stable for follow-up-mild for now.  Procedures/Studies: None  Discharge Diagnoses:  Principal Problem:   Urinary tract infection associated with cystostomy catheter ([place]) Active Problems:   HYPERCHOLESTEROLEMIA   Coronary atherosclerosis   HTN (hypertension)   Sepsis ([place])   Gastroesophageal reflux disease without esophagitis   Suprapubic catheter (H[facility])   CKD (chronic kidney disease), stage III (HCC)   Chronic diastolic CHF (congestive heart failure) (HCC)   PAF (paroxysmal atrial fibrillation) ([place])   Discharge Instructions:  Activity:  As tolerated with Full fall precautions use walker/cane & assistance as needed  Discharge Instructions    Call MD for:  extreme fatigue   Complete by:  As directed    Call MD for:  persistant dizziness or light-headedness   Complete by:  As directed    Diet - low sodium heart healthy   Complete by:  As directed    Discharge instructions   Complete by:  As directed    Follow with Primary MD  Marletta Lor, MD in 1 week  Please ask your primary care practitioner to repeat blood cultures once to complete a course of antibiotics  Please get a complete blood count and chemistry panel checked by your Primary MD at your next visit, and again as instructed by your Primary MD.  Get Medicines reviewed and adjusted: Please take all your medications with you for  your next visit with your Primary MD  Laboratory/radiological data: Please request your Primary MD to go over all hospital tests  and procedure/radiological results at the follow up, please ask your Primary MD to get all Hospital records sent to his/her office.  In some cases, they will be blood work, cultures and biopsy results pending at the time of your discharge. Please request that your primary care M.D. follows up on these results.  Also Note the following: If you experience worsening of your admission symptoms, develop shortness of breath, life threatening emergency, suicidal or homicidal thoughts you must seek medical attention immediately by calling 911 or calling your MD immediately  if symptoms less severe.  You must read complete instructions/literature along with all the possible adverse reactions/side effects for all the Medicines you take and that have been prescribed to you. Take any new Medicines after you have completely understood and accpet all the possible adverse reactions/side effects.   Do not drive when taking Pain medications or sleeping medications (Benzodaizepines)  Do not take more than prescribed Pain, Sleep and Anxiety Medications. It is not advisable to combine anxiety,sleep and pain medications without talking with your primary care practitioner  Special Instructions: If you have smoked or chewed Tobacco  in the last 2 yrs please stop smoking, stop any regular Alcohol  and or any Recreational drug use.  Wear Seat belts while driving.  Please note: You were cared for by a hospitalist during your hospital stay. Once you are discharged, your primary care physician will handle any further medical issues. Please note that NO REFILLS for any discharge medications will be authorized once you are discharged, as it is imperative that you return to your primary care physician (or establish a relationship with a primary care physician if you do not have one) for your post hospital discharge needs so that they can reassess your need for medications and monitor your lab values.   Increase activity slowly    Complete by:  As directed      Allergies as of 12/29/2017      Reactions   Pantoprazole Other (See Comments)   Headache and lightheaded   Nitrofurantoin Hives   Sulfa Antibiotics Hives, Itching   Lidocaine Swelling   Mouth swelling   Tape Other (See Comments)   TAPE WILL TEAR THE SKIN!!!      Medication List    TAKE these medications   acetaminophen 500 MG tablet Commonly known as:  TYLENOL Take 1,000 mg by mouth every 6 (six) hours as needed for pain or fever.   apixaban 5 MG Tabs tablet Commonly known as:  ELIQUIS Take 1 tablet (5 mg total) by mouth 2 (two) times daily.   atorvastatin 80 MG tablet Commonly known as:  LIPITOR Take 0.5 tablets (40 mg total) by mouth every evening.   ciprofloxacin 500 MG tablet Commonly known as:  CIPRO Take 1 tablet (500 mg total) by mouth 2 (two) times daily for 22 doses.   furosemide 20 MG tablet Commonly known as:  LASIX Take 1 tablet (20 mg total) by mouth daily as needed. For swelling   galantamine 8 MG 24 hr capsule Commonly known as:  RAZADYNE ER Take 8 mg by mouth daily with breakfast.   hydroxypropyl methylcellulose / hypromellose 2.5 % ophthalmic solution Commonly known as:  ISOPTO TEARS / GONIOVISC Place 1 drop into both eyes 2 (two) times daily.   multivitamin with minerals Tabs tablet Take 1 tablet by mouth daily.  nitroGLYCERIN 0.4 MG SL tablet Commonly known as:  NITROSTAT Place 0.4 mg under the tongue every 5 (five) minutes as needed for chest pain.   ranitidine 150 MG tablet Commonly known as:  ZANTAC Take 1 tablet (150 mg total) by mouth 2 (two) times daily.   TUMS PO Take 3 tablets by mouth daily as needed (gas).      Follow-up Information    Marletta Lor, MD. Schedule an appointment as soon as possible for a visit in 1 week(s).   Specialty:  Internal Medicine Contact information: [place] [address] [phone]        Burnell Blanks, MD. Schedule  an appointment as soon as possible for a visit in 2 week(s).   Specialty:  Cardiology Contact information: [address] [place] [address] [phone]          Allergies  Allergen Reactions  . Pantoprazole Other (See Comments)    Headache and lightheaded  . Nitrofurantoin Hives  . Sulfa Antibiotics Hives and Itching  . Lidocaine Swelling    Mouth swelling   . Tape Other (See Comments)    TAPE WILL TEAR THE SKIN!!!    Consultations:   None  Other Procedures/Studies: Dg Chest 1 View  Result Date: 12/28/2017 CLINICAL DATA:  Shortness of breath EXAM: CHEST  1 VIEW COMPARISON:  12/25/2017, 05/11/2017 FINDINGS: Post sternotomy changes. Cardiomegaly. Streaky atelectasis or minimal infiltrate at the left base. Possible tiny left effusion. No pneumothorax. IMPRESSION: 1. Patchy atelectasis or minimal infiltrate left lung base. 2. Cardiomegaly.  Possible trace left pleural effusion. Electronically Signed   By: Donavan Foil M.D.   On: 12/28/2017 17:06   Dg Chest 2 View  Result Date: 12/25/2017 CLINICAL DATA:  Fever. EXAM: CHEST - 2 VIEW COMPARISON:  Radiographs 05/11/2017 FINDINGS: Lung volumes are low. Patient is post median sternotomy and CABG. Unchanged heart size and mediastinal contours. No confluent airspace disease. No pulmonary edema, large pleural effusion or pneumothorax. Osseous structures are unchanged, spine evaluation on the lateral view limited by overlying artifact. IMPRESSION: Low lung volumes.  No acute findings.  No evidence of pneumonia. Electronically Signed   By: Keith Rake M.D.   On: 12/25/2017 23:32   US Renal  Result Date: 12/26/2017 CLINICAL DATA:  Urinary tract infection, chronic kidney disease stage 3, hypertension EXAM: RENAL / URINARY TRACT ULTRASOUND COMPLETE COMPARISON:  02/19/2017 FINDINGS: Right Kidney: Length: 12.4 cm. Right upper pole anechoic simple renal cyst measures 5.3 x 4.8 x 5.0 cm. Mild increased echogenicity and  cortical thinning. No hydronephrosis or acute process. Left Kidney: Length: 12.1 cm. Mild increased echogenicity and cortical thinning. Small lower pole cortical hypoechoic cyst measures 12 mm. Nonobstructing echogenic shadowing calculus in mid to upper pole measuring 6 mm. Bladder: Collapsed by suprapubic catheter. IMPRESSION: No acute finding or hydronephrosis. Increased echogenicity and cortical thinning compatible with chronic medical renal disease. Renal cysts Suspect nonobstructing subcentimeter left nephrolithiasis Electronically Signed   By: Jerilynn Mages.  Shick M.D.   On: 12/26/2017 22:54     TODAY-DAY OF DISCHARGE:  Subjective:   Ronald [name] today has no headache,no chest abdominal pain,no new weakness tingling or numbness, feels much better wants to go home today.   Objective:   Blood pressure (!) 135/59, pulse (!) 47, temperature 98.2 F (36.8 C), resp. rate 18, height 5\' 5"  (1.651 m), weight 71.3 kg, SpO2 95 %.  Intake/Output Summary (Last 24 hours) at 12/29/2017 0949 Last data filed at 12/29/2017  4193 Gross per 24 hour  Intake 940 ml  Output 2800 ml  Net -1860 ml   Filed Weights   12/26/17 0012  Weight: 71.3 kg    Exam: Awake Alert, Oriented *3, No new F.N deficits, Normal affect [place].AT,PERRAL Supple Neck,No JVD, No cervical lymphadenopathy appriciated.  Symmetrical Chest wall movement, Good air movement bilaterally, CTAB RRR,No Gallops,Rubs or new Murmurs, No Parasternal Heave +ve B.Sounds, Abd Soft, Non tender, No organomegaly appriciated, No rebound -guarding or rigidity. No Cyanosis, Clubbing or edema, No new Rash or bruise   PERTINENT RADIOLOGIC STUDIES: Dg Chest 1 View  Result Date: 12/28/2017 CLINICAL DATA:  Shortness of breath EXAM: CHEST  1 VIEW COMPARISON:  12/25/2017, 05/11/2017 FINDINGS: Post sternotomy changes. Cardiomegaly. Streaky atelectasis or minimal infiltrate at the left base. Possible tiny left effusion. No pneumothorax. IMPRESSION: 1. Patchy atelectasis  or minimal infiltrate left lung base. 2. Cardiomegaly.  Possible trace left pleural effusion. Electronically Signed   By: Donavan Foil M.D.   On: 12/28/2017 17:06   Dg Chest 2 View  Result Date: 12/25/2017 CLINICAL DATA:  Fever. EXAM: CHEST - 2 VIEW COMPARISON:  Radiographs 05/11/2017 FINDINGS: Lung volumes are low. Patient is post median sternotomy and CABG. Unchanged heart size and mediastinal contours. No confluent airspace disease. No pulmonary edema, large pleural effusion or pneumothorax. Osseous structures are unchanged, spine evaluation on the lateral view limited by overlying artifact. IMPRESSION: Low lung volumes.  No acute findings.  No evidence of pneumonia. Electronically Signed   By: Keith Rake M.D.   On: 12/25/2017 23:32   US Renal  Result Date: 12/26/2017 CLINICAL DATA:  Urinary tract infection, chronic kidney disease stage 3, hypertension EXAM: RENAL / URINARY TRACT ULTRASOUND COMPLETE COMPARISON:  02/19/2017 FINDINGS: Right Kidney: Length: 12.4 cm. Right upper pole anechoic simple renal cyst measures 5.3 x 4.8 x 5.0 cm. Mild increased echogenicity and cortical thinning. No hydronephrosis or acute process. Left Kidney: Length: 12.1 cm. Mild increased echogenicity and cortical thinning. Small lower pole cortical hypoechoic cyst measures 12 mm. Nonobstructing echogenic shadowing calculus in mid to upper pole measuring 6 mm. Bladder: Collapsed by suprapubic catheter. IMPRESSION: No acute finding or hydronephrosis. Increased echogenicity and cortical thinning compatible with chronic medical renal disease. Renal cysts Suspect nonobstructing subcentimeter left nephrolithiasis Electronically Signed   By: Jerilynn Mages.  Shick M.D.   On: 12/26/2017 22:54     PERTINENT LAB RESULTS: CBC: Recent Labs    12/27/17 0316 12/29/17 0532  WBC 8.1 5.4  HGB 11.2* 12.2*  HCT 34.7* 37.2*  PLT 84* 90*   CMET CMP     Component Value Date/Time   NA 140 12/29/2017 0532   K 3.6 12/29/2017 0532   CL 109  12/29/2017 0532   CO2 22 12/29/2017 0532   GLUCOSE 108 (H) 12/29/2017 0532   BUN 19 12/29/2017 0532   CREATININE 1.03 12/29/2017 0532   CALCIUM 8.5 (L) 12/29/2017 0532   PROT 5.6 (L) 12/25/2017 2342   PROT 6.5 05/10/2017 0741   ALBUMIN 3.3 (L) 12/25/2017 2342   ALBUMIN 4.3 05/10/2017 0741   AST 44 (H) 12/25/2017 2342   ALT 27 12/25/2017 2342   ALKPHOS 70 12/25/2017 2342   BILITOT 0.9 12/25/2017 2342   BILITOT 0.8 05/10/2017 0741   GFRNONAA >60 12/29/2017 0532   GFRAA >60 12/29/2017 0532    GFR Estimated Creatinine Clearance: 43.1 mL/min (by C-G formula based on SCr of 1.03 mg/dL). No results for input(s): LIPASE, AMYLASE in the last 72 hours. No results for  input(s): CKTOTAL, CKMB, CKMBINDEX, TROPONINI in the last 72 hours. Invalid input(s): POCBNP No results for input(s): DDIMER in the last 72 hours. No results for input(s): HGBA1C in the last 72 hours. No results for input(s): CHOL, HDL, LDLCALC, TRIG, CHOLHDL, LDLDIRECT in the last 72 hours. No results for input(s): TSH, T4TOTAL, T3FREE, THYROIDAB in the last 72 hours.  Invalid input(s): FREET3 No results for input(s): VITAMINB12, FOLATE, FERRITIN, TIBC, IRON, RETICCTPCT in the last 72 hours. Coags: No results for input(s): INR in the last 72 hours.  Invalid input(s): PT Microbiology: Recent Results (from the past 240 hour(s))  Blood Culture (routine x 2)     Status: Abnormal   Collection Time: 12/25/17 11:43 PM  Result Value Ref Range Status   Specimen Description BLOOD RIGHT WRIST  Final   Special Requests   Final    BOTTLES DRAWN AEROBIC AND ANAEROBIC Blood Culture results may not be optimal due to an excessive volume of blood received in culture bottles   Culture  Setup Time   Final    GRAM NEGATIVE RODS AEROBIC BOTTLE ONLY CRITICAL VALUE NOTED.  VALUE IS CONSISTENT WITH PREVIOUSLY REPORTED AND CALLED VALUE.    Culture (A)  Final    PSEUDOMONAS AERUGINOSA SUSCEPTIBILITIES PERFORMED ON PREVIOUS CULTURE WITHIN  THE LAST 5 DAYS. Performed at [facility], [place] [address] [place], [place] [address]    Report Status 12/29/2017 FINAL  Final  Blood Culture (routine x 2)     Status: Abnormal   Collection Time: 12/25/17 11:53 PM  Result Value Ref Range Status   Specimen Description BLOOD RIGHT ANTECUBITAL  Final   Special Requests   Final    BOTTLES DRAWN AEROBIC AND ANAEROBIC Blood Culture results may not be optimal due to an excessive volume of blood received in culture bottles   Culture  Setup Time   Final    GRAM NEGATIVE RODS AEROBIC BOTTLE ONLY CRITICAL RESULT CALLED TO, READ BACK BY AND VERIFIED WITH: PHARMD T DANG [phone] Performed at [facility], [place] [address] [place], [place] [address]    Culture PSEUDOMONAS AERUGINOSA (A)  Final   Report Status 12/29/2017 FINAL  Final   Organism ID, Bacteria PSEUDOMONAS AERUGINOSA  Final      Susceptibility   Pseudomonas aeruginosa - MIC*    CEFTAZIDIME <=1 SENSITIVE Sensitive     CIPROFLOXACIN <=0.25 SENSITIVE Sensitive     GENTAMICIN <=1 SENSITIVE Sensitive     IMIPENEM 1 SENSITIVE Sensitive     PIP/TAZO <=4 SENSITIVE Sensitive     CEFEPIME <=1 SENSITIVE Sensitive     * PSEUDOMONAS AERUGINOSA  Blood Culture ID Panel (Reflexed)     Status: Abnormal   Collection Time: 12/25/17 11:53 PM  Result Value Ref Range Status   Enterococcus species NOT DETECTED NOT DETECTED Final   Listeria monocytogenes NOT DETECTED NOT DETECTED Final   Staphylococcus species NOT DETECTED NOT DETECTED Final   Staphylococcus aureus (BCID) NOT DETECTED NOT DETECTED Final   Streptococcus species NOT DETECTED NOT DETECTED Final   Streptococcus agalactiae NOT DETECTED NOT DETECTED Final   Streptococcus pneumoniae NOT DETECTED NOT DETECTED Final   Streptococcus pyogenes NOT DETECTED NOT DETECTED Final   Acinetobacter baumannii NOT DETECTED NOT DETECTED Final   Enterobacteriaceae species NOT DETECTED NOT DETECTED Final   Enterobacter cloacae complex NOT  DETECTED NOT DETECTED Final   Escherichia coli NOT DETECTED NOT DETECTED Final   Klebsiella oxytoca NOT DETECTED NOT DETECTED Final   Klebsiella  pneumoniae NOT DETECTED NOT DETECTED Final   Proteus species NOT DETECTED NOT DETECTED Final   Serratia marcescens NOT DETECTED NOT DETECTED Final   Carbapenem resistance NOT DETECTED NOT DETECTED Final   Haemophilus influenzae NOT DETECTED NOT DETECTED Final   Neisseria meningitidis NOT DETECTED NOT DETECTED Final   Pseudomonas aeruginosa DETECTED (A) NOT DETECTED Final    Comment: CRITICAL RESULT CALLED TO, READ BACK BY AND VERIFIED WITH: PHARMD T DANG [phone] MLM    Candida albicans NOT DETECTED NOT DETECTED Final   Candida glabrata NOT DETECTED NOT DETECTED Final   Candida krusei NOT DETECTED NOT DETECTED Final   Candida parapsilosis NOT DETECTED NOT DETECTED Final   Candida tropicalis NOT DETECTED NOT DETECTED Final    Comment: Performed at [facility], [place] [address], [place], [place] [address]  Urine culture     Status: Abnormal   Collection Time: 12/25/17 11:54 PM  Result Value Ref Range Status   Specimen Description URINE, SUPRAPUBIC  Final   Special Requests   Final    NONE Performed at [facility], [place] [address] [place], [place] [address]    Culture (A)  Final    >=100,000 COLONIES/mL ESCHERICHIA COLI >=100,000 COLONIES/mL STAPHYLOCOCCUS AUREUS    Report Status 12/29/2017 FINAL  Final   Organism ID, Bacteria ESCHERICHIA COLI (A)  Final   Organism ID, Bacteria STAPHYLOCOCCUS AUREUS (A)  Final      Susceptibility   Escherichia coli - MIC*    AMPICILLIN 8 SENSITIVE Sensitive     CEFAZOLIN <=4 SENSITIVE Sensitive     CEFTRIAXONE <=1 SENSITIVE Sensitive     CIPROFLOXACIN <=0.25 SENSITIVE Sensitive     GENTAMICIN <=1 SENSITIVE Sensitive     IMIPENEM <=0.25 SENSITIVE Sensitive     NITROFURANTOIN <=16 SENSITIVE Sensitive     TRIMETH/SULFA <=20 SENSITIVE Sensitive     AMPICILLIN/SULBACTAM <=2 SENSITIVE  Sensitive     PIP/TAZO <=4 SENSITIVE Sensitive     Extended ESBL NEGATIVE Sensitive     * >=100,000 COLONIES/mL ESCHERICHIA COLI   Staphylococcus aureus - MIC*    CIPROFLOXACIN >=8 RESISTANT Resistant     GENTAMICIN <=0.5 SENSITIVE Sensitive     NITROFURANTOIN <=16 SENSITIVE Sensitive     OXACILLIN 0.5 SENSITIVE Sensitive     TETRACYCLINE >=16 RESISTANT Resistant     VANCOMYCIN <=0.5 SENSITIVE Sensitive     TRIMETH/SULFA >=320 RESISTANT Resistant     CLINDAMYCIN <=0.25 SENSITIVE Sensitive     RIFAMPIN <=0.5 SENSITIVE Sensitive     Inducible Clindamycin NEGATIVE Sensitive     * >=100,000 COLONIES/mL STAPHYLOCOCCUS AUREUS    FURTHER DISCHARGE INSTRUCTIONS:  Get Medicines reviewed and adjusted: Please take all your medications with you for your next visit with your Primary MD  Laboratory/radiological data: Please request your Primary MD to go over all hospital tests and procedure/radiological results at the follow up, please ask your Primary MD to get all Hospital records sent to his/her office.  In some cases, they will be blood work, cultures and biopsy results pending at the time of your discharge. Please request that your primary care M.D. goes through all the records of your hospital data and follows up on these results.  Also Note the following: If you experience worsening of your admission symptoms, develop shortness of breath, life threatening emergency, suicidal or homicidal thoughts you must seek medical attention immediately by calling 911 or calling your MD immediately  if symptoms less severe.  You must read complete instructions/literature  along with all the possible adverse reactions/side effects for all the Medicines you take and that have been prescribed to you. Take any new Medicines after you have completely understood and accpet all the possible adverse reactions/side effects.   Do not drive when taking Pain medications or sleeping medications (Benzodaizepines)  Do  not take more than prescribed Pain, Sleep and Anxiety Medications. It is not advisable to combine anxiety,sleep and pain medications without talking with your primary care practitioner  Special Instructions: If you have smoked or chewed Tobacco  in the last 2 yrs please stop smoking, stop any regular Alcohol  and or any Recreational drug use.  Wear Seat belts while driving.  Please note: You were cared for by a hospitalist during your hospital stay. Once you are discharged, your primary care physician will handle any further medical issues. Please note that NO REFILLS for any discharge medications will be authorized once you are discharged, as it is imperative that you return to your primary care physician (or establish a relationship with a primary care physician if you do not have one) for your post hospital discharge needs so that they can reassess your need for medications and monitor your lab values.  Total Time spent coordinating discharge including counseling, education and face to face time equals 35 minutes.  SignedOren Binet 12/29/2017 9:49 AM

## 2017-12-31 DIAGNOSIS — Z9350 Unspecified cystostomy status: Secondary | ICD-10-CM | POA: Diagnosis not present

## 2017-12-31 DIAGNOSIS — I480 Paroxysmal atrial fibrillation: Secondary | ICD-10-CM | POA: Diagnosis not present

## 2017-12-31 DIAGNOSIS — B965 Pseudomonas (aeruginosa) (mallei) (pseudomallei) as the cause of diseases classified elsewhere: Secondary | ICD-10-CM | POA: Diagnosis not present

## 2017-12-31 DIAGNOSIS — I130 Hypertensive heart and chronic kidney disease with heart failure and stage 1 through stage 4 chronic kidney disease, or unspecified chronic kidney disease: Secondary | ICD-10-CM | POA: Diagnosis not present

## 2017-12-31 DIAGNOSIS — F0390 Unspecified dementia without behavioral disturbance: Secondary | ICD-10-CM | POA: Diagnosis not present

## 2017-12-31 DIAGNOSIS — Z8546 Personal history of malignant neoplasm of prostate: Secondary | ICD-10-CM | POA: Diagnosis not present

## 2017-12-31 DIAGNOSIS — I5032 Chronic diastolic (congestive) heart failure: Secondary | ICD-10-CM | POA: Diagnosis not present

## 2017-12-31 DIAGNOSIS — Z951 Presence of aortocoronary bypass graft: Secondary | ICD-10-CM | POA: Diagnosis not present

## 2017-12-31 DIAGNOSIS — N183 Chronic kidney disease, stage 3 (moderate): Secondary | ICD-10-CM | POA: Diagnosis not present

## 2017-12-31 DIAGNOSIS — E785 Hyperlipidemia, unspecified: Secondary | ICD-10-CM | POA: Diagnosis not present

## 2017-12-31 DIAGNOSIS — T83510D Infection and inflammatory reaction due to cystostomy catheter, subsequent encounter: Secondary | ICD-10-CM | POA: Diagnosis not present

## 2017-12-31 DIAGNOSIS — K219 Gastro-esophageal reflux disease without esophagitis: Secondary | ICD-10-CM | POA: Diagnosis not present

## 2017-12-31 DIAGNOSIS — I2510 Atherosclerotic heart disease of native coronary artery without angina pectoris: Secondary | ICD-10-CM | POA: Diagnosis not present

## 2017-12-31 DIAGNOSIS — D696 Thrombocytopenia, unspecified: Secondary | ICD-10-CM | POA: Diagnosis not present

## 2017-12-31 DIAGNOSIS — Z7901 Long term (current) use of anticoagulants: Secondary | ICD-10-CM | POA: Diagnosis not present

## 2018-01-01 ENCOUNTER — Telehealth: Payer: Self-pay | Admitting: Family Medicine

## 2018-01-01 DIAGNOSIS — B965 Pseudomonas (aeruginosa) (mallei) (pseudomallei) as the cause of diseases classified elsewhere: Secondary | ICD-10-CM | POA: Diagnosis not present

## 2018-01-01 DIAGNOSIS — I130 Hypertensive heart and chronic kidney disease with heart failure and stage 1 through stage 4 chronic kidney disease, or unspecified chronic kidney disease: Secondary | ICD-10-CM | POA: Diagnosis not present

## 2018-01-01 DIAGNOSIS — T83510D Infection and inflammatory reaction due to cystostomy catheter, subsequent encounter: Secondary | ICD-10-CM | POA: Diagnosis not present

## 2018-01-01 DIAGNOSIS — I5032 Chronic diastolic (congestive) heart failure: Secondary | ICD-10-CM | POA: Diagnosis not present

## 2018-01-01 DIAGNOSIS — I2510 Atherosclerotic heart disease of native coronary artery without angina pectoris: Secondary | ICD-10-CM | POA: Diagnosis not present

## 2018-01-01 DIAGNOSIS — N183 Chronic kidney disease, stage 3 (moderate): Secondary | ICD-10-CM | POA: Diagnosis not present

## 2018-01-01 LAB — CULTURE, BLOOD (ROUTINE X 2)

## 2018-01-01 NOTE — Telephone Encounter (Signed)
Transition Care Management Follow-up Telephone Call  Answered by Mrs. Scales.   Date discharged? 12/29/17   How have you been since you were released from the hospital? Feeling tired today.  [facility] was just in the home and states the pt's vitals were good.   Do you understand why you were in the hospital? yes   Do you understand the discharge instructions? No.  She was unclear how the pt was to have repeat lab work.  I explained those labs will be ordered when he comes for his hospital follow up.   Where were you discharged to? Home   Items Reviewed:  Medications reviewed: yes  Allergies reviewed: yes  Dietary changes reviewed: yes  Referrals reviewed: yes   Functional Questionnaire:   Activities of Daily Living (ADLs):   He states they are independent in the following: ambulation, bathing and hygiene, feeding, continence, grooming, toileting and dressing States they require assistance with the following: Putting on his socks but only right now during his illness   Any transportation issues/concerns?: no   Any patient concerns? Gets UITs frequently.  Would like to know how to prevent this.   Confirmed importance and date/time of follow-up visits scheduled yes  Provider Appointment booked with Dr. Girtha Hake  Confirmed with patient if condition begins to worsen call PCP or go to the ER.  Patient was given the office number and encouraged to call back with question or concerns.  : yes

## 2018-01-01 NOTE — Care Management Important Message (Signed)
Important Message  Patient Details  Name: Ronald [name] MRN: [id number] Date of Birth: 1927-08-03   Medicare Important Message Given:  Yes  IM signed on 12/29/2017   [name] 01/01/2018, 9:07 AM

## 2018-01-03 ENCOUNTER — Telehealth: Payer: Self-pay | Admitting: Internal Medicine

## 2018-01-03 NOTE — Telephone Encounter (Addendum)
Copied from [place] [id number]. Topic: Quick Communication - See Telephone Encounter >> Jan 03, 2018  4:01 PM Ivar Drape wrote: CRM for notification. See Telephone encounter for: 01/03/18. Wells Guiles a nurse w/[facility] [phone] need a verbal order for skilled nursing for 2w1, 1w8, and Physical Therapy 2w4.  She stated she sent two faxes prior but haven't heard anything.

## 2018-01-03 NOTE — Telephone Encounter (Signed)
Verbal orders given to [facility] via vm.

## 2018-01-05 ENCOUNTER — Ambulatory Visit (INDEPENDENT_AMBULATORY_CARE_PROVIDER_SITE_OTHER): Payer: Medicare Other | Admitting: Family Medicine

## 2018-01-05 ENCOUNTER — Encounter: Payer: Self-pay | Admitting: Family Medicine

## 2018-01-05 ENCOUNTER — Other Ambulatory Visit: Payer: Self-pay

## 2018-01-05 VITALS — BP 100/62 | HR 50 | Temp 97.6°F | Ht 65.0 in | Wt 159.3 lb

## 2018-01-05 DIAGNOSIS — I480 Paroxysmal atrial fibrillation: Secondary | ICD-10-CM | POA: Diagnosis not present

## 2018-01-05 DIAGNOSIS — Z8546 Personal history of malignant neoplasm of prostate: Secondary | ICD-10-CM | POA: Diagnosis not present

## 2018-01-05 DIAGNOSIS — I5022 Chronic systolic (congestive) heart failure: Secondary | ICD-10-CM | POA: Diagnosis not present

## 2018-01-05 DIAGNOSIS — N183 Chronic kidney disease, stage 3 unspecified: Secondary | ICD-10-CM

## 2018-01-05 DIAGNOSIS — I5032 Chronic diastolic (congestive) heart failure: Secondary | ICD-10-CM | POA: Diagnosis not present

## 2018-01-05 DIAGNOSIS — I130 Hypertensive heart and chronic kidney disease with heart failure and stage 1 through stage 4 chronic kidney disease, or unspecified chronic kidney disease: Secondary | ICD-10-CM | POA: Diagnosis not present

## 2018-01-05 DIAGNOSIS — I10 Essential (primary) hypertension: Secondary | ICD-10-CM | POA: Diagnosis not present

## 2018-01-05 DIAGNOSIS — I2510 Atherosclerotic heart disease of native coronary artery without angina pectoris: Secondary | ICD-10-CM | POA: Diagnosis not present

## 2018-01-05 DIAGNOSIS — A4152 Sepsis due to Pseudomonas: Secondary | ICD-10-CM | POA: Diagnosis not present

## 2018-01-05 DIAGNOSIS — T83510D Infection and inflammatory reaction due to cystostomy catheter, subsequent encounter: Secondary | ICD-10-CM | POA: Diagnosis not present

## 2018-01-05 DIAGNOSIS — B965 Pseudomonas (aeruginosa) (mallei) (pseudomallei) as the cause of diseases classified elsewhere: Secondary | ICD-10-CM | POA: Diagnosis not present

## 2018-01-05 NOTE — Patient Instructions (Signed)
Remember flu vaccine by next week  Drink plently of fluids  Follow up for any recurrent fever or other concerns.

## 2018-01-05 NOTE — Progress Notes (Signed)
Subjective:     Patient ID: Ronald [name], male   DOB: December 09, 1927, 84 y.o.   MRN: [id number]  HPI Patient seen for hospital follow-up.  He has multiple chronic problems including history of hypertension, systolic heart failure, CAD, atrial fibrillation, GERD, chronic kidney disease, prostate cancer, history of recurrent sepsis, chronic suprapubic Foley catheter, and mild dementia.  He was admitted on 12/25/2017 with fever and chills  He had sepsis secondary to Pseudomonas presumed urinary origin.  His catheter was changed out.  He actually had a catheter change just a few days prior to his admission.  He was treated empirically with meropenem and then discharged on ciprofloxacin.  He will complete that course this coming Monday to complete 2-week course of antibiotics.  Doing well at this time.  No recurrent fever.  Appetite is stable.  Recommendation was to get follow-up blood cultures as well as CBC and basic metabolic panel after completing antibiotics.  No other medication changes were made.  They plan to get flu vaccine next week at local pharmacy.  Past Medical History:  Diagnosis Date  . Arthritis    "joints ache" (01/02/2014)  . At risk for sleep apnea    STOP-BANG= 4    SENT TO PCP 12-23-2013  . Bladder calculi   . CHF (congestive heart failure) ([place])   . Chronic cystitis   . Coronary artery disease CARDIOLOGIST-  DR Grace Bushy  MI -- S/P  11/89 CABG x6 (70% circ; 90% PD; 60-70% distal left main; 30% left circ; 90% 1st diag;, 2nd diag and 3rd diag. w/90%,  mild stenosis LAD and 60-70% pLAD)  Re-do CABG x5 in 1997  . Diverticulosis   . GERD (gastroesophageal reflux disease)   . History of Bell's palsy    RIGHT SIDE-- NO RESIDUAL  . Hx of dizziness   . Hyperlipidemia   . Hypertension    "not anymore" (01/02/2014)  . Ischemic cardiomyopathy    ef 35-40% per cath 08-28-2013  . Kidney stones "years ago"   "passed them"  . Melanoma of ear ([place])    "right"  . Mild dementia  ([place])   . Myocardial infarction ([place]) 1986; 1997  . Nocturia   . OAB (overactive bladder)   . Prostate cancer ([place]) 1998   S/P  [place]; [place]  . S/P CABG (coronary artery bypass graft)    [place]  . Skipped heart beats    occasional  . Urethral stricture   . UTI (urinary tract infection) 09/2015  . Wears hearing aid    bilateral  . Wears partial dentures    Past Surgical History:  Procedure Laterality Date  . CARDIAC CATHETERIZATION  02-22-2005  dr Vidal Schwalbe   mild to moderate lv dysfunction with inferobasilar akinesis/  totally occluded SVG to Intermediate Diagonal and SVG to PDA and RCA branches, totally occluded native coronary circulation with diffuse disease pLAD diagonal system with potentially could be ischemic/  patent SVG to OM with collaterals to dRCA and patent LIMA to LAD and diagonal systemss  . CARDIAC CATHETERIZATION  08-28-2013  DR Daneen Schick   widely patent sequential left internal graft to the diagonal/LAD, widely patent SVG to OM with proximal 50% narrowing noted in the graft, total occlusion SVG's to RCA, RI, and the Diagonal/ LV dysfunction with inferobasal aneurysm and mid anterior wall region of akinesis/  overall EF 35-40%/  Total occlusion of the navtive circulation/  no significant change compared to 2006 cath  . CARDIOVASCULAR STRESS TEST  08-01-2011  dr Angelena Form   inferior scar and possible soft tissue attenuation with minimal peri-infarct ischemia, small region of anterior ischemia and scar/  LVEF 53% LV wall motion with inferior hypokinesis/ no significant change from scan july 2011  . CATARACT EXTRACTION W/ INTRAOCULAR LENS  IMPLANT, BILATERAL Bilateral 2007  . CORONARY ARTERY BYPASS GRAFT  11/ [place]-- 6 vessel/  1997 Re-do 5 vessel  . CYSTOSCOPY WITH URETHRAL DILATATION N/A 12/25/2013   Procedure: CYSTOSCOPY WITH URETHRAL DILATATION, WITH BIOPSY;  Surgeon: Bernestine Amass, MD;  [facility];  Service: Urology;  Laterality: N/A;  . CYSTOSCOPY WITH URETHRAL DILATATION N/A 12/22/2014   Procedure: CYSTOSCOPY WITH URETHRAL DILATATION;  Surgeon: Rana Snare, MD;  Location: [facility];  Service: Urology;  Laterality: N/A;  BALLOON DILATION CATHETER    . EUS  10/05/2011   Procedure: ESOPHAGEAL ENDOSCOPIC ULTRASOUND (EUS) RADIAL;  Surgeon: Arta Silence, MD;  Location[facility];  Service: Endoscopy;  Laterality: N/A;  . INSERTION OF SUPRAPUBIC CATHETER N/A 12/22/2014   Procedure: INSERTION OF SUPRAPUBIC CATHETER ;  Surgeon: Rana Snare, MD;  Location: [facility];  Service: Urology;  Laterality: N/A;  . LAPAROSCOPIC CHOLECYSTECTOMY  12-23-2005  . LEFT HEART CATHETERIZATION WITH CORONARY/GRAFT ANGIOGRAM N/A 08/28/2013   Procedure: LEFT HEART CATHETERIZATION WITH Beatrix Fetters;  Surgeon: Sinclair Grooms, MD;  Location: [facility] C[facility] LAB;  Service: Cardiovascular;  Laterality: N/A;  . MELANOMA EXCISION  X 1   "ear"  . RADIOACTIVE PROSTATE SEED IMPLANTS  1998  . SKIN CANCER EXCISION  X 2   "top of head"  . [place]    reports that he quit smoking about 34 years ago. His smoking use included cigars. He quit after 40.00 years of use. He quit smokeless tobacco use about 4 years ago.  His smokeless tobacco use included chew. He reports that he does not drink alcohol or use drugs. family history includes Diabetes in his father; Heart disease in his brother and mother. Allergies  Allergen Reactions  . Pantoprazole Other (See Comments)    Headache and lightheaded  . Nitrofurantoin Hives  . Sulfa Antibiotics Hives and Itching  . Lidocaine Swelling    Mouth swelling   . Tape Other (See Comments)    TAPE WILL TEAR THE SKIN!!!     Review of Systems  Constitutional: Negative for chills, fever and unexpected weight change.  Respiratory: Negative for cough and shortness of breath.   Cardiovascular: Negative for chest pain.  Gastrointestinal:  Negative for abdominal pain, diarrhea, nausea and vomiting.  Skin: Negative for rash.  Psychiatric/Behavioral: Negative for agitation and confusion.       Objective:   Physical Exam  Constitutional: He appears well-developed and well-nourished.  HENT:  Mouth/Throat: Oropharynx is clear and moist.  Cardiovascular: Normal rate and regular rhythm.  Pulmonary/Chest: Effort normal and breath sounds normal.  Abdominal: Soft.  He has some mild granulation tissue at site of catheter but no surrounding erythema.  Musculoskeletal: He exhibits no edema.  Neurological: He is alert. No cranial nerve deficit.       Assessment:     #1 sepsis secondary to Pseudomonas probably related to his suprapubic catheter-clinically improved following broad-spectrum antibiotics and now completing 2-week course of Cipro  #2 chronic kidney disease stage III  #3 history of CAD with prior bypass  #4 history of  reported systolic and diastolic heart failure  #5 chronic atrial fibrillation on Eliquis  #6 history of mild dementia    Plan:     -They plan to get flu vaccine next week -Complete course of ciprofloxacin -We wrote for future lab orders for blood culture, CBC, basic metabolic panel next Wednesday after completing his antibiotics on Monday -Follow-up immediately for any recurrent fever or other concerns -He will be establishing with new primary provider here soon  Ronald Post MD [facility]

## 2018-01-08 ENCOUNTER — Telehealth: Payer: Self-pay | Admitting: Internal Medicine

## 2018-01-08 DIAGNOSIS — T83510D Infection and inflammatory reaction due to cystostomy catheter, subsequent encounter: Secondary | ICD-10-CM | POA: Diagnosis not present

## 2018-01-08 DIAGNOSIS — I2510 Atherosclerotic heart disease of native coronary artery without angina pectoris: Secondary | ICD-10-CM | POA: Diagnosis not present

## 2018-01-08 DIAGNOSIS — I5032 Chronic diastolic (congestive) heart failure: Secondary | ICD-10-CM | POA: Diagnosis not present

## 2018-01-08 DIAGNOSIS — I130 Hypertensive heart and chronic kidney disease with heart failure and stage 1 through stage 4 chronic kidney disease, or unspecified chronic kidney disease: Secondary | ICD-10-CM | POA: Diagnosis not present

## 2018-01-08 DIAGNOSIS — N183 Chronic kidney disease, stage 3 (moderate): Secondary | ICD-10-CM | POA: Diagnosis not present

## 2018-01-08 DIAGNOSIS — B965 Pseudomonas (aeruginosa) (mallei) (pseudomallei) as the cause of diseases classified elsewhere: Secondary | ICD-10-CM | POA: Diagnosis not present

## 2018-01-08 NOTE — Telephone Encounter (Signed)
Copied from [place] [id number]. Topic: Quick Communication - Home Health Verbal Orders >> Jan 08, 2018  1:52 PM Yvette Rack wrote: Caller/Agency: [facility] Callback Number: [phone] Requesting OT/PT/Skilled Nursing/Social Work: OT Barnetta Chapel [phone] Frequency: OT for 1 rime a week for 2 weeks and 2 times an week for 2 weeks

## 2018-01-09 DIAGNOSIS — I130 Hypertensive heart and chronic kidney disease with heart failure and stage 1 through stage 4 chronic kidney disease, or unspecified chronic kidney disease: Secondary | ICD-10-CM | POA: Diagnosis not present

## 2018-01-09 DIAGNOSIS — T83510D Infection and inflammatory reaction due to cystostomy catheter, subsequent encounter: Secondary | ICD-10-CM | POA: Diagnosis not present

## 2018-01-09 DIAGNOSIS — I2510 Atherosclerotic heart disease of native coronary artery without angina pectoris: Secondary | ICD-10-CM | POA: Diagnosis not present

## 2018-01-09 DIAGNOSIS — N183 Chronic kidney disease, stage 3 (moderate): Secondary | ICD-10-CM | POA: Diagnosis not present

## 2018-01-09 DIAGNOSIS — I5032 Chronic diastolic (congestive) heart failure: Secondary | ICD-10-CM | POA: Diagnosis not present

## 2018-01-09 DIAGNOSIS — B965 Pseudomonas (aeruginosa) (mallei) (pseudomallei) as the cause of diseases classified elsewhere: Secondary | ICD-10-CM | POA: Diagnosis not present

## 2018-01-09 NOTE — Telephone Encounter (Signed)
Verbal orders given to Ronald [name] via vm.

## 2018-01-10 ENCOUNTER — Encounter: Payer: Self-pay | Admitting: Family Medicine

## 2018-01-10 ENCOUNTER — Ambulatory Visit: Payer: Medicare Other | Admitting: Family Medicine

## 2018-01-10 ENCOUNTER — Other Ambulatory Visit: Payer: Medicare Other

## 2018-01-10 VITALS — BP 100/40 | HR 48 | Wt 158.9 lb

## 2018-01-10 DIAGNOSIS — A4152 Sepsis due to Pseudomonas: Secondary | ICD-10-CM | POA: Diagnosis not present

## 2018-01-10 LAB — CBC WITH DIFFERENTIAL/PLATELET
Basophils Absolute: 0 10*3/uL (ref 0.0–0.1)
Basophils Relative: 0.5 % (ref 0.0–3.0)
Eosinophils Absolute: 0.1 10*3/uL (ref 0.0–0.7)
Eosinophils Relative: 2.7 % (ref 0.0–5.0)
HCT: 40.1 % (ref 39.0–52.0)
Hemoglobin: 13.2 g/dL (ref 13.0–17.0)
Lymphocytes Relative: 15.9 % (ref 12.0–46.0)
Lymphs Abs: 0.8 10*3/uL (ref 0.7–4.0)
MCHC: 32.9 g/dL (ref 30.0–36.0)
MCV: 91.3 fl (ref 78.0–100.0)
Monocytes Absolute: 0.8 10*3/uL (ref 0.1–1.0)
Monocytes Relative: 17.1 % — ABNORMAL HIGH (ref 3.0–12.0)
Neutro Abs: 3.1 10*3/uL (ref 1.4–7.7)
Neutrophils Relative %: 63.8 % (ref 43.0–77.0)
Platelets: 160 10*3/uL (ref 150.0–400.0)
RBC: 4.39 Mil/uL (ref 4.22–5.81)
RDW: 14.4 % (ref 11.5–15.5)
WBC: 4.9 10*3/uL (ref 4.0–10.5)

## 2018-01-10 LAB — BASIC METABOLIC PANEL
BUN: 23 mg/dL (ref 6–23)
CO2: 28 mEq/L (ref 19–32)
Calcium: 8.9 mg/dL (ref 8.4–10.5)
Chloride: 106 mEq/L (ref 96–112)
Creatinine, Ser: 1.14 mg/dL (ref 0.40–1.50)
GFR: 64.38 mL/min (ref >60.00)
Glucose, Bld: 89 mg/dL (ref 70–99)
Potassium: 4 mEq/L (ref 3.5–5.1)
Sodium: 141 mEq/L (ref 135–145)

## 2018-01-10 NOTE — Patient Instructions (Signed)
The patient was taken to the lab for his lab work as directed by Dr. Elease Hashimoto

## 2018-01-10 NOTE — Progress Notes (Unsigned)
Ronald [name] is an 84 year old male who comes in today accompanied by his wife for lab work.  He was seen last week by Dr. Elease Hashimoto and lab work was ordered for today.  Instead of taking the lab they brought him in to see [place] for an office visit  I reviewed Dr. Erick Blinks note.  We took the patient to the lab for his lab work as ordered by Dr. Elease Hashimoto

## 2018-01-12 DIAGNOSIS — T83510D Infection and inflammatory reaction due to cystostomy catheter, subsequent encounter: Secondary | ICD-10-CM | POA: Diagnosis not present

## 2018-01-12 DIAGNOSIS — I5032 Chronic diastolic (congestive) heart failure: Secondary | ICD-10-CM | POA: Diagnosis not present

## 2018-01-12 DIAGNOSIS — B965 Pseudomonas (aeruginosa) (mallei) (pseudomallei) as the cause of diseases classified elsewhere: Secondary | ICD-10-CM | POA: Diagnosis not present

## 2018-01-12 DIAGNOSIS — I130 Hypertensive heart and chronic kidney disease with heart failure and stage 1 through stage 4 chronic kidney disease, or unspecified chronic kidney disease: Secondary | ICD-10-CM | POA: Diagnosis not present

## 2018-01-12 DIAGNOSIS — N183 Chronic kidney disease, stage 3 (moderate): Secondary | ICD-10-CM | POA: Diagnosis not present

## 2018-01-12 DIAGNOSIS — I2510 Atherosclerotic heart disease of native coronary artery without angina pectoris: Secondary | ICD-10-CM | POA: Diagnosis not present

## 2018-01-15 DIAGNOSIS — I2510 Atherosclerotic heart disease of native coronary artery without angina pectoris: Secondary | ICD-10-CM | POA: Diagnosis not present

## 2018-01-15 DIAGNOSIS — I5032 Chronic diastolic (congestive) heart failure: Secondary | ICD-10-CM | POA: Diagnosis not present

## 2018-01-15 DIAGNOSIS — T83510D Infection and inflammatory reaction due to cystostomy catheter, subsequent encounter: Secondary | ICD-10-CM | POA: Diagnosis not present

## 2018-01-15 DIAGNOSIS — I130 Hypertensive heart and chronic kidney disease with heart failure and stage 1 through stage 4 chronic kidney disease, or unspecified chronic kidney disease: Secondary | ICD-10-CM | POA: Diagnosis not present

## 2018-01-15 DIAGNOSIS — B965 Pseudomonas (aeruginosa) (mallei) (pseudomallei) as the cause of diseases classified elsewhere: Secondary | ICD-10-CM | POA: Diagnosis not present

## 2018-01-15 DIAGNOSIS — N183 Chronic kidney disease, stage 3 (moderate): Secondary | ICD-10-CM | POA: Diagnosis not present

## 2018-01-16 DIAGNOSIS — N183 Chronic kidney disease, stage 3 (moderate): Secondary | ICD-10-CM | POA: Diagnosis not present

## 2018-01-16 DIAGNOSIS — T83510D Infection and inflammatory reaction due to cystostomy catheter, subsequent encounter: Secondary | ICD-10-CM | POA: Diagnosis not present

## 2018-01-16 DIAGNOSIS — I2510 Atherosclerotic heart disease of native coronary artery without angina pectoris: Secondary | ICD-10-CM | POA: Diagnosis not present

## 2018-01-16 DIAGNOSIS — I5032 Chronic diastolic (congestive) heart failure: Secondary | ICD-10-CM | POA: Diagnosis not present

## 2018-01-16 DIAGNOSIS — B965 Pseudomonas (aeruginosa) (mallei) (pseudomallei) as the cause of diseases classified elsewhere: Secondary | ICD-10-CM | POA: Diagnosis not present

## 2018-01-16 DIAGNOSIS — I130 Hypertensive heart and chronic kidney disease with heart failure and stage 1 through stage 4 chronic kidney disease, or unspecified chronic kidney disease: Secondary | ICD-10-CM | POA: Diagnosis not present

## 2018-01-16 LAB — CULTURE, BLOOD (SINGLE): MICRO NUMBER:: 91244235

## 2018-01-17 DIAGNOSIS — T83510D Infection and inflammatory reaction due to cystostomy catheter, subsequent encounter: Secondary | ICD-10-CM | POA: Diagnosis not present

## 2018-01-17 DIAGNOSIS — B965 Pseudomonas (aeruginosa) (mallei) (pseudomallei) as the cause of diseases classified elsewhere: Secondary | ICD-10-CM | POA: Diagnosis not present

## 2018-01-17 DIAGNOSIS — I2510 Atherosclerotic heart disease of native coronary artery without angina pectoris: Secondary | ICD-10-CM | POA: Diagnosis not present

## 2018-01-17 DIAGNOSIS — N183 Chronic kidney disease, stage 3 (moderate): Secondary | ICD-10-CM | POA: Diagnosis not present

## 2018-01-17 DIAGNOSIS — I130 Hypertensive heart and chronic kidney disease with heart failure and stage 1 through stage 4 chronic kidney disease, or unspecified chronic kidney disease: Secondary | ICD-10-CM | POA: Diagnosis not present

## 2018-01-17 DIAGNOSIS — I5032 Chronic diastolic (congestive) heart failure: Secondary | ICD-10-CM | POA: Diagnosis not present

## 2018-01-18 DIAGNOSIS — N183 Chronic kidney disease, stage 3 (moderate): Secondary | ICD-10-CM | POA: Diagnosis not present

## 2018-01-18 DIAGNOSIS — B965 Pseudomonas (aeruginosa) (mallei) (pseudomallei) as the cause of diseases classified elsewhere: Secondary | ICD-10-CM | POA: Diagnosis not present

## 2018-01-18 DIAGNOSIS — T83510D Infection and inflammatory reaction due to cystostomy catheter, subsequent encounter: Secondary | ICD-10-CM | POA: Diagnosis not present

## 2018-01-18 DIAGNOSIS — I130 Hypertensive heart and chronic kidney disease with heart failure and stage 1 through stage 4 chronic kidney disease, or unspecified chronic kidney disease: Secondary | ICD-10-CM | POA: Diagnosis not present

## 2018-01-18 DIAGNOSIS — I2510 Atherosclerotic heart disease of native coronary artery without angina pectoris: Secondary | ICD-10-CM | POA: Diagnosis not present

## 2018-01-18 DIAGNOSIS — I5032 Chronic diastolic (congestive) heart failure: Secondary | ICD-10-CM | POA: Diagnosis not present

## 2018-01-19 DIAGNOSIS — I2510 Atherosclerotic heart disease of native coronary artery without angina pectoris: Secondary | ICD-10-CM | POA: Diagnosis not present

## 2018-01-19 DIAGNOSIS — N183 Chronic kidney disease, stage 3 (moderate): Secondary | ICD-10-CM | POA: Diagnosis not present

## 2018-01-19 DIAGNOSIS — T83510D Infection and inflammatory reaction due to cystostomy catheter, subsequent encounter: Secondary | ICD-10-CM | POA: Diagnosis not present

## 2018-01-19 DIAGNOSIS — B965 Pseudomonas (aeruginosa) (mallei) (pseudomallei) as the cause of diseases classified elsewhere: Secondary | ICD-10-CM | POA: Diagnosis not present

## 2018-01-19 DIAGNOSIS — I5032 Chronic diastolic (congestive) heart failure: Secondary | ICD-10-CM | POA: Diagnosis not present

## 2018-01-19 DIAGNOSIS — I130 Hypertensive heart and chronic kidney disease with heart failure and stage 1 through stage 4 chronic kidney disease, or unspecified chronic kidney disease: Secondary | ICD-10-CM | POA: Diagnosis not present

## 2018-01-22 DIAGNOSIS — I130 Hypertensive heart and chronic kidney disease with heart failure and stage 1 through stage 4 chronic kidney disease, or unspecified chronic kidney disease: Secondary | ICD-10-CM | POA: Diagnosis not present

## 2018-01-22 DIAGNOSIS — I5032 Chronic diastolic (congestive) heart failure: Secondary | ICD-10-CM | POA: Diagnosis not present

## 2018-01-22 DIAGNOSIS — Z23 Encounter for immunization: Secondary | ICD-10-CM | POA: Diagnosis not present

## 2018-01-22 DIAGNOSIS — B965 Pseudomonas (aeruginosa) (mallei) (pseudomallei) as the cause of diseases classified elsewhere: Secondary | ICD-10-CM | POA: Diagnosis not present

## 2018-01-22 DIAGNOSIS — N183 Chronic kidney disease, stage 3 (moderate): Secondary | ICD-10-CM | POA: Diagnosis not present

## 2018-01-22 DIAGNOSIS — T83510D Infection and inflammatory reaction due to cystostomy catheter, subsequent encounter: Secondary | ICD-10-CM | POA: Diagnosis not present

## 2018-01-22 DIAGNOSIS — I2510 Atherosclerotic heart disease of native coronary artery without angina pectoris: Secondary | ICD-10-CM | POA: Diagnosis not present

## 2018-01-23 DIAGNOSIS — N3281 Overactive bladder: Secondary | ICD-10-CM | POA: Diagnosis not present

## 2018-01-24 DIAGNOSIS — I2510 Atherosclerotic heart disease of native coronary artery without angina pectoris: Secondary | ICD-10-CM | POA: Diagnosis not present

## 2018-01-24 DIAGNOSIS — I130 Hypertensive heart and chronic kidney disease with heart failure and stage 1 through stage 4 chronic kidney disease, or unspecified chronic kidney disease: Secondary | ICD-10-CM | POA: Diagnosis not present

## 2018-01-24 DIAGNOSIS — B965 Pseudomonas (aeruginosa) (mallei) (pseudomallei) as the cause of diseases classified elsewhere: Secondary | ICD-10-CM | POA: Diagnosis not present

## 2018-01-24 DIAGNOSIS — N183 Chronic kidney disease, stage 3 (moderate): Secondary | ICD-10-CM | POA: Diagnosis not present

## 2018-01-24 DIAGNOSIS — I5032 Chronic diastolic (congestive) heart failure: Secondary | ICD-10-CM | POA: Diagnosis not present

## 2018-01-24 DIAGNOSIS — T83510D Infection and inflammatory reaction due to cystostomy catheter, subsequent encounter: Secondary | ICD-10-CM | POA: Diagnosis not present

## 2018-01-25 DIAGNOSIS — I2510 Atherosclerotic heart disease of native coronary artery without angina pectoris: Secondary | ICD-10-CM | POA: Diagnosis not present

## 2018-01-25 DIAGNOSIS — I130 Hypertensive heart and chronic kidney disease with heart failure and stage 1 through stage 4 chronic kidney disease, or unspecified chronic kidney disease: Secondary | ICD-10-CM | POA: Diagnosis not present

## 2018-01-25 DIAGNOSIS — B965 Pseudomonas (aeruginosa) (mallei) (pseudomallei) as the cause of diseases classified elsewhere: Secondary | ICD-10-CM | POA: Diagnosis not present

## 2018-01-25 DIAGNOSIS — I5032 Chronic diastolic (congestive) heart failure: Secondary | ICD-10-CM | POA: Diagnosis not present

## 2018-01-25 DIAGNOSIS — N183 Chronic kidney disease, stage 3 (moderate): Secondary | ICD-10-CM | POA: Diagnosis not present

## 2018-01-25 DIAGNOSIS — T83510D Infection and inflammatory reaction due to cystostomy catheter, subsequent encounter: Secondary | ICD-10-CM | POA: Diagnosis not present

## 2018-01-26 DIAGNOSIS — I130 Hypertensive heart and chronic kidney disease with heart failure and stage 1 through stage 4 chronic kidney disease, or unspecified chronic kidney disease: Secondary | ICD-10-CM | POA: Diagnosis not present

## 2018-01-26 DIAGNOSIS — T83510D Infection and inflammatory reaction due to cystostomy catheter, subsequent encounter: Secondary | ICD-10-CM | POA: Diagnosis not present

## 2018-01-26 DIAGNOSIS — I2510 Atherosclerotic heart disease of native coronary artery without angina pectoris: Secondary | ICD-10-CM | POA: Diagnosis not present

## 2018-01-26 DIAGNOSIS — B965 Pseudomonas (aeruginosa) (mallei) (pseudomallei) as the cause of diseases classified elsewhere: Secondary | ICD-10-CM | POA: Diagnosis not present

## 2018-01-26 DIAGNOSIS — N183 Chronic kidney disease, stage 3 (moderate): Secondary | ICD-10-CM | POA: Diagnosis not present

## 2018-01-26 DIAGNOSIS — I5032 Chronic diastolic (congestive) heart failure: Secondary | ICD-10-CM | POA: Diagnosis not present

## 2018-01-30 DIAGNOSIS — B965 Pseudomonas (aeruginosa) (mallei) (pseudomallei) as the cause of diseases classified elsewhere: Secondary | ICD-10-CM | POA: Diagnosis not present

## 2018-01-30 DIAGNOSIS — I2510 Atherosclerotic heart disease of native coronary artery without angina pectoris: Secondary | ICD-10-CM | POA: Diagnosis not present

## 2018-01-30 DIAGNOSIS — I5032 Chronic diastolic (congestive) heart failure: Secondary | ICD-10-CM | POA: Diagnosis not present

## 2018-01-30 DIAGNOSIS — N183 Chronic kidney disease, stage 3 (moderate): Secondary | ICD-10-CM | POA: Diagnosis not present

## 2018-01-30 DIAGNOSIS — I130 Hypertensive heart and chronic kidney disease with heart failure and stage 1 through stage 4 chronic kidney disease, or unspecified chronic kidney disease: Secondary | ICD-10-CM | POA: Diagnosis not present

## 2018-01-30 DIAGNOSIS — T83510D Infection and inflammatory reaction due to cystostomy catheter, subsequent encounter: Secondary | ICD-10-CM | POA: Diagnosis not present

## 2018-02-02 DIAGNOSIS — S0502XA Injury of conjunctiva and corneal abrasion without foreign body, left eye, initial encounter: Secondary | ICD-10-CM | POA: Diagnosis not present

## 2018-02-02 DIAGNOSIS — H10013 Acute follicular conjunctivitis, bilateral: Secondary | ICD-10-CM | POA: Diagnosis not present

## 2018-02-06 ENCOUNTER — Encounter (HOSPITAL_COMMUNITY): Payer: Self-pay | Admitting: Emergency Medicine

## 2018-02-06 ENCOUNTER — Emergency Department (HOSPITAL_COMMUNITY)
Admission: EM | Admit: 2018-02-06 | Discharge: 2018-02-06 | Disposition: A | Discharge status: Home / Self Care | Payer: Medicare Other | Attending: Emergency Medicine | Admitting: Emergency Medicine

## 2018-02-06 DIAGNOSIS — F0390 Unspecified dementia without behavioral disturbance: Secondary | ICD-10-CM | POA: Insufficient documentation

## 2018-02-06 DIAGNOSIS — Z87891 Personal history of nicotine dependence: Secondary | ICD-10-CM | POA: Diagnosis not present

## 2018-02-06 DIAGNOSIS — I2510 Atherosclerotic heart disease of native coronary artery without angina pectoris: Secondary | ICD-10-CM | POA: Diagnosis not present

## 2018-02-06 DIAGNOSIS — I5032 Chronic diastolic (congestive) heart failure: Secondary | ICD-10-CM | POA: Insufficient documentation

## 2018-02-06 DIAGNOSIS — Z8546 Personal history of malignant neoplasm of prostate: Secondary | ICD-10-CM | POA: Insufficient documentation

## 2018-02-06 DIAGNOSIS — Z7901 Long term (current) use of anticoagulants: Secondary | ICD-10-CM | POA: Insufficient documentation

## 2018-02-06 DIAGNOSIS — Z951 Presence of aortocoronary bypass graft: Secondary | ICD-10-CM | POA: Insufficient documentation

## 2018-02-06 DIAGNOSIS — I252 Old myocardial infarction: Secondary | ICD-10-CM | POA: Insufficient documentation

## 2018-02-06 DIAGNOSIS — I130 Hypertensive heart and chronic kidney disease with heart failure and stage 1 through stage 4 chronic kidney disease, or unspecified chronic kidney disease: Secondary | ICD-10-CM | POA: Insufficient documentation

## 2018-02-06 DIAGNOSIS — T83510D Infection and inflammatory reaction due to cystostomy catheter, subsequent encounter: Secondary | ICD-10-CM | POA: Diagnosis not present

## 2018-02-06 DIAGNOSIS — Y69 Unspecified misadventure during surgical and medical care: Secondary | ICD-10-CM | POA: Insufficient documentation

## 2018-02-06 DIAGNOSIS — N183 Chronic kidney disease, stage 3 (moderate): Secondary | ICD-10-CM | POA: Insufficient documentation

## 2018-02-06 DIAGNOSIS — T83010A Breakdown (mechanical) of cystostomy catheter, initial encounter: Secondary | ICD-10-CM | POA: Insufficient documentation

## 2018-02-06 DIAGNOSIS — I5022 Chronic systolic (congestive) heart failure: Secondary | ICD-10-CM | POA: Insufficient documentation

## 2018-02-06 DIAGNOSIS — B965 Pseudomonas (aeruginosa) (mallei) (pseudomallei) as the cause of diseases classified elsewhere: Secondary | ICD-10-CM | POA: Diagnosis not present

## 2018-02-06 DIAGNOSIS — Z79899 Other long term (current) drug therapy: Secondary | ICD-10-CM | POA: Insufficient documentation

## 2018-02-06 DIAGNOSIS — T83028A Displacement of other indwelling urethral catheter, initial encounter: Secondary | ICD-10-CM | POA: Diagnosis not present

## 2018-02-06 MED ORDER — ACETAMINOPHEN 325 MG PO TABS
650.0000 mg | ORAL_TABLET | Freq: Once | ORAL | Status: AC
  Administered 2018-02-06: 650 mg via ORAL
  Filled 2018-02-06: qty 2

## 2018-02-06 NOTE — Discharge Instructions (Signed)
Keep your scheduled appointment with [facility].  Return if concerned for any reason.  You can take Tylenol every 4 hours as needed for pain

## 2018-02-06 NOTE — ED Triage Notes (Addendum)
Patient's foley catheter ( suprapubic) accidentally dislodged this evening . Pt. requesting replacement , pt. added frequent UTIs / bladder spasms .

## 2018-02-06 NOTE — ED Provider Notes (Signed)
[facility] EMERGENCY DEPARTMENT Provider Note   CSN: [id number] Arrival date & time: 02/06/18  2126   Level 5 caveat dementia history is obtained from patient and from patient's wife  History   Chief Complaint Chief Complaint  Patient presents with  . Suprapubic (Foley) Catheter Dislodged    HPI Ronald [name] is a 84 y.o. male.  Patient's suprapubic catheter came out approximately 1 hour ago after he accidentally tugged on it.  He had slight bleeding and pain at the site of the stoma after catheter came out.  He also had bladder spasm earlier today however none presently no other complaint no other associated symptoms.  Nothing made symptoms better or worse  HPI  Past Medical History:  Diagnosis Date  . Arthritis    "joints ache" (01/02/2014)  . At risk for sleep apnea    STOP-BANG= 4    SENT TO PCP 12-23-2013  . Bladder calculi   . CHF (congestive heart failure) ([place])   . Chronic cystitis   . Coronary artery disease CARDIOLOGIST-  DR Grace Bushy  MI -- S/P  11/89 CABG x6 (70% circ; 90% PD; 60-70% distal left main; 30% left circ; 90% 1st diag;, 2nd diag and 3rd diag. w/90%,  mild stenosis LAD and 60-70% pLAD)  Re-do CABG x5 in 1997  . Diverticulosis   . GERD (gastroesophageal reflux disease)   . History of Bell's palsy    RIGHT SIDE-- NO RESIDUAL  . Hx of dizziness   . Hyperlipidemia   . Hypertension    "not anymore" (01/02/2014)  . Ischemic cardiomyopathy    ef 35-40% per cath 08-28-2013  . Kidney stones "years ago"   "passed them"  . Melanoma of ear ([place])    "right"  . Mild dementia ([place])   . Myocardial infarction ([place]) 1986; 1997  . Nocturia   . OAB (overactive bladder)   . Prostate cancer ([place]) 1998   S/P  [place]; [place]  . S/P CABG (coronary artery bypass graft)    [place]  . Skipped heart beats    occasional  . Urethral stricture   . UTI (urinary tract infection) 09/2015  . Wears hearing  aid    bilateral  . Wears partial dentures     Patient Active Problem List   Diagnosis Date Noted  . Chronic diastolic CHF (congestive heart failure) ([place]) 12/26/2017  . PAF (paroxysmal atrial fibrillation) ([place]) 12/26/2017  . Sepsis secondary to UTI ([place])   . Fever 05/11/2017  . AKI (acute kidney injury) ([place])   . CKD (chronic kidney disease), stage III ([place])   . Bacteremia due to Pseudomonas 01/21/2016  . Debility 01/21/2016  . History of Bell's palsy   . Gastroesophageal reflux disease without esophagitis   . Coronary artery disease involving coronary bypass graft of native heart without angina pectoris   . History of prostate cancer   . Suprapubic catheter ([place])   . Incontinence of feces   . Renal failure syndrome   . Bacteremia   . NSTEMI (non-ST elevated myocardial infarction) ([place]) 01/16/2016  . Septic shock ([place])   . ARF (acute renal failure) ([place]) 10/13/2015  . Influenza A 12/05/2014  . Urinary tract infection associated with cystostomy catheter ([place]) 10/03/2014  . Acute lower UTI 09/10/2014  . Bacteremia due to Enterococcus 01/22/2014  . Sepsis ([place]) 04/02/2013  . Chronic systolic CHF (congestive heart failure) ([place]) 04/02/2013  .  Urethral stricture 12/25/2013  . Bladder calculus 12/25/2013  . LV dysfunction 09/23/2013  . Unstable angina ([place]) 08/27/2013  . Bacteremia due to Escherichia coli 05/20/2012  . Urinary tract infection 09/15/2011  . Calculus of kidney 09/12/2011  . HYPERCHOLESTEROLEMIA 10/18/2007  . HTN (hypertension) 10/18/2007  . OSTEOARTHRITIS 02/07/2007  . BELL'S PALSY, RIGHT 10/26/2006  . Coronary atherosclerosis 10/26/2006  . PROSTATE CANCER, HX OF 10/26/2006  . NEPHROLITHIASIS, HX OF 10/26/2006    Past Surgical History:  Procedure Laterality Date  . CARDIAC CATHETERIZATION  02-22-2005  dr Vidal Schwalbe   mild to moderate lv dysfunction with inferobasilar akinesis/  totally occluded SVG to Intermediate Diagonal and SVG to PDA and RCA branches,  totally occluded native coronary circulation with diffuse disease pLAD diagonal system with potentially could be ischemic/  patent SVG to OM with collaterals to dRCA and patent LIMA to LAD and diagonal systemss  . CARDIAC CATHETERIZATION  08-28-2013  DR Daneen Schick   widely patent sequential left internal graft to the diagonal/LAD, widely patent SVG to OM with proximal 50% narrowing noted in the graft, total occlusion SVG's to RCA, RI, and the Diagonal/ LV dysfunction with inferobasal aneurysm and mid anterior wall region of akinesis/  overall EF 35-40%/  Total occlusion of the navtive circulation/  no significant change compared to 2006 cath  . CARDIOVASCULAR STRESS TEST  08-01-2011  dr Angelena Form   inferior scar and possible soft tissue attenuation with minimal peri-infarct ischemia, small region of anterior ischemia and scar/  LVEF 53% LV wall motion with inferior hypokinesis/ no significant change from scan july 2011  . CATARACT EXTRACTION W/ INTRAOCULAR LENS  IMPLANT, BILATERAL Bilateral 2007  . CORONARY ARTERY BYPASS GRAFT  11/ [place]-- 6 vessel/  1997 Re-do 5 vessel  . CYSTOSCOPY WITH URETHRAL DILATATION N/A 12/25/2013   Procedure: CYSTOSCOPY WITH URETHRAL DILATATION, WITH BIOPSY;  Surgeon: Bernestine Amass, MD;  Location: [facility];  Service: Urology;  Laterality: N/A;  . CYSTOSCOPY WITH URETHRAL DILATATION N/A 12/22/2014   Procedure: CYSTOSCOPY WITH URETHRAL DILATATION;  Surgeon: Rana Snare, MD;  [facility];  Service: Urology;  Laterality: N/A;  BALLOON DILATION CATHETER    . EUS  10/05/2011   Procedure: ESOPHAGEAL ENDOSCOPIC ULTRASOUND (EUS) RADIAL;  Surgeon: Arta Silence, MD;  [facility];  Service: Endoscopy;  Laterality: N/A;  . INSERTION OF SUPRAPUBIC CATHETER N/A 12/22/2014   Procedure: INSERTION OF SUPRAPUBIC CATHETER ;  Surgeon: Rana Snare, MD;  [facility];  Service: Urology;  Laterality: N/A;  . LAPAROSCOPIC CHOLECYSTECTOMY   12-23-2005  . LEFT HEART CATHETERIZATION WITH CORONARY/GRAFT ANGIOGRAM N/A 08/28/2013   Procedure: LEFT HEART CATHETERIZATION WITH Beatrix Fetters;  Surgeon: Sinclair Grooms, MD;  Location[facility] C[facility] LAB;  Service: Cardiovascular;  Laterality: N/A;  . MELANOMA EXCISION  X 1   "ear"  . RADIOACTIVE PROSTATE SEED IMPLANTS  1998  . SKIN CANCER EXCISION  X 2   "top of head"  . TONSILLECTOMY AND ADENOIDECTOMY  1954        Home Medications    Prior to Admission medications   Medication Sig Start Date End Date Taking? Authorizing Provider  acetaminophen (TYLENOL) 500 MG tablet Take 1,000 mg by mouth every 6 (six) hours as needed for pain or fever.     [provider]  apixaban (ELIQUIS) 5 MG TABS tablet Take 1 tablet (5 mg total) by mouth 2 (two) times daily. 05/10/17   Lauree Chandler  D, MD  atorvastatin (LIPITOR) 80 MG tablet Take 0.5 tablets (40 mg total) by mouth every evening. 05/24/17   Marletta Lor, MD  Calcium Carbonate Antacid (TUMS PO) Take 3 tablets by mouth daily as needed (gas).    [provider]  furosemide (LASIX) 20 MG tablet Take 1 tablet (20 mg total) by mouth daily as needed. For swelling 11/29/17   Burnell Blanks, MD  galantamine (RAZADYNE ER) 8 MG 24 hr capsule Take 8 mg by mouth daily with breakfast.    [provider]  hydroxypropyl methylcellulose (ISOPTO TEARS) 2.5 % ophthalmic solution Place 1 drop into both eyes 2 (two) times daily.     [provider]  Multiple Vitamin (MULTIVITAMIN WITH MINERALS) TABS Take 1 tablet by mouth daily.    [provider]  nitroGLYCERIN (NITROSTAT) 0.4 MG SL tablet Place 0.4 mg under the tongue every 5 (five) minutes as needed for chest pain.     [provider]  ranitidine (ZANTAC) 150 MG tablet Take 1 tablet (150 mg total) by mouth 2 (two) times daily. 05/18/16   Burnell Blanks, MD    Family History Family History  Problem Relation Age of Onset    . Heart disease Mother   . Diabetes Father   . Heart disease Brother     Social History Social History   Tobacco Use  . Smoking status: Former Smoker    Years: 40.00    Types: Cigars    Last attempt to quit: 12/24/1983    Years since quitting: 34.1  . Smokeless tobacco: Former Systems developer    Types: Chew    Quit date: 08/28/2013  . Tobacco comment: smoked a pipe  Substance Use Topics  . Alcohol use: No  . Drug use: No     Allergies   Pantoprazole; Nitrofurantoin; Sulfa antibiotics; Lidocaine; and Tape   Review of Systems Review of Systems  Unable to perform ROS: Dementia  Genitourinary:       Suprapubic catheter in place chronically.  Hematological: Bruises/bleeds easily.     Physical Exam Updated Vital Signs BP (!) 148/73 (BP Location: Left Arm)   Pulse (!) 58   Temp 97.7 F (36.5 C) (Oral)   Resp 16   SpO2 99%   Physical Exam  Constitutional: He appears well-developed and well-nourished. No distress.  HENT:  Head: Normocephalic and atraumatic.  Eyes: Pupils are equal, round, and reactive to light. Conjunctivae are normal.  Neck: Neck supple. No tracheal deviation present. No thyromegaly present.  Cardiovascular: Normal rate and regular rhythm.  No murmur heard. Pulmonary/Chest: Effort normal and breath sounds normal.  Abdominal: Soft. Bowel sounds are normal. He exhibits no distension. There is no tenderness.  Soma at suprapubic area with slight amount of fresh blood around stoma  Musculoskeletal: Normal range of motion. He exhibits no edema or tenderness.  Neurological: He is alert. Coordination normal.  Skin: Skin is warm and dry. No rash noted.  Psychiatric: He has a normal mood and affect.  Nursing note and vitals reviewed.    ED Treatments / Results  Labs (all labs ordered are listed, but only abnormal results are displayed) Labs Reviewed - No data to display  EKG None  Radiology No results found.  Procedures BLADDER  CATHETERIZATION Date/Time: 02/06/2018 10:13 PM Performed by: Orlie Dakin, MD Authorized by: Orlie Dakin, MD   Consent:    Consent obtained:  Verbal   Consent given by:  Patient and spouse   Risks discussed:  False passage and infection   Alternatives discussed:  Referral Pre-procedure details:    Procedure purpose:  Therapeutic   Preparation: Patient was prepped and draped in usual sterile fashion   Anesthesia (see MAR for exact dosages):    Anesthesia method:  None Procedure details:    Catheter insertion:  Indwelling   Catheter type:  Foley   Catheter size:  16 Fr   Bladder irrigation: no     Number of attempts:  1   Urine characteristics:  Blood-tinged Post-procedure details:    Patient tolerance of procedure:  Tolerated well, no immediate complications Comments:     Suprapubic catheter inserted   (including critical care time)  Medications Ordered in ED Medications - No data to display   Initial Impression / Assessment and Plan / ED Course  I have reviewed the triage vital signs and the nursing notes.  Pertinent labs & imaging results that were available during my care of the patient were reviewed by me and considered in my medical decision making (see chart for details).     Return as needed or keep next scheduled appointment with [facility] Final Clinical Impressions(s) / ED Diagnoses  Diagnosis suprapubic catheter insertion Final diagnoses:  None    ED Discharge Orders    None       Orlie Dakin, MD 02/06/18 2215

## 2018-02-13 DIAGNOSIS — T83510D Infection and inflammatory reaction due to cystostomy catheter, subsequent encounter: Secondary | ICD-10-CM | POA: Diagnosis not present

## 2018-02-13 DIAGNOSIS — B965 Pseudomonas (aeruginosa) (mallei) (pseudomallei) as the cause of diseases classified elsewhere: Secondary | ICD-10-CM | POA: Diagnosis not present

## 2018-02-13 DIAGNOSIS — I130 Hypertensive heart and chronic kidney disease with heart failure and stage 1 through stage 4 chronic kidney disease, or unspecified chronic kidney disease: Secondary | ICD-10-CM | POA: Diagnosis not present

## 2018-02-13 DIAGNOSIS — N183 Chronic kidney disease, stage 3 (moderate): Secondary | ICD-10-CM | POA: Diagnosis not present

## 2018-02-13 DIAGNOSIS — I5032 Chronic diastolic (congestive) heart failure: Secondary | ICD-10-CM | POA: Diagnosis not present

## 2018-02-13 DIAGNOSIS — I2510 Atherosclerotic heart disease of native coronary artery without angina pectoris: Secondary | ICD-10-CM | POA: Diagnosis not present

## 2018-02-21 DIAGNOSIS — I130 Hypertensive heart and chronic kidney disease with heart failure and stage 1 through stage 4 chronic kidney disease, or unspecified chronic kidney disease: Secondary | ICD-10-CM | POA: Diagnosis not present

## 2018-02-21 DIAGNOSIS — B965 Pseudomonas (aeruginosa) (mallei) (pseudomallei) as the cause of diseases classified elsewhere: Secondary | ICD-10-CM | POA: Diagnosis not present

## 2018-02-21 DIAGNOSIS — I5032 Chronic diastolic (congestive) heart failure: Secondary | ICD-10-CM | POA: Diagnosis not present

## 2018-02-21 DIAGNOSIS — N183 Chronic kidney disease, stage 3 (moderate): Secondary | ICD-10-CM | POA: Diagnosis not present

## 2018-02-21 DIAGNOSIS — T83510D Infection and inflammatory reaction due to cystostomy catheter, subsequent encounter: Secondary | ICD-10-CM | POA: Diagnosis not present

## 2018-02-21 DIAGNOSIS — I2510 Atherosclerotic heart disease of native coronary artery without angina pectoris: Secondary | ICD-10-CM | POA: Diagnosis not present

## 2018-02-28 ENCOUNTER — Ambulatory Visit (INDEPENDENT_AMBULATORY_CARE_PROVIDER_SITE_OTHER): Payer: Medicare Other | Admitting: Internal Medicine

## 2018-02-28 ENCOUNTER — Encounter: Payer: Self-pay | Admitting: Internal Medicine

## 2018-02-28 VITALS — BP 120/80 | HR 50 | Temp 97.5°F | Wt 157.9 lb

## 2018-02-28 DIAGNOSIS — I25810 Atherosclerosis of coronary artery bypass graft(s) without angina pectoris: Secondary | ICD-10-CM | POA: Diagnosis not present

## 2018-02-28 DIAGNOSIS — I5022 Chronic systolic (congestive) heart failure: Secondary | ICD-10-CM | POA: Diagnosis not present

## 2018-02-28 DIAGNOSIS — N183 Chronic kidney disease, stage 3 unspecified: Secondary | ICD-10-CM

## 2018-02-28 DIAGNOSIS — I255 Ischemic cardiomyopathy: Secondary | ICD-10-CM

## 2018-02-28 DIAGNOSIS — Z9359 Other cystostomy status: Secondary | ICD-10-CM

## 2018-02-28 DIAGNOSIS — I10 Essential (primary) hypertension: Secondary | ICD-10-CM | POA: Diagnosis not present

## 2018-02-28 DIAGNOSIS — E7800 Pure hypercholesterolemia, unspecified: Secondary | ICD-10-CM

## 2018-02-28 NOTE — Progress Notes (Signed)
Established Patient Office Visit     CC/Reason for Visit: Establish care, follow-up on chronic medical conditions  HPI: Ronald [name] is a 84 y.o. male who is coming in today for the above mentioned reasons.  He is past due for his annual wellness visit.  Past Medical History is significant for: Coronary artery disease status post CABG, stable by cath in 2015, followed by cardiology last seen in September 2019, hyperlipidemia controlled on statin, well-controlled hypertension, history of atrial fibrillation anticoagulated on Eliquis which has been rate controlled, also has a history of ischemic cardiomyopathy with difficulty tolerating ACE inhibitors and beta-blockers due to hypotension.  He also has a history of a suprapubic catheter.  Has had several hospital admissions this year for catheter exchange, sepsis due to urinary source with indwelling suprapubic catheter as well as an admission at the end of last year for influenza.  He has no acute complaints at today's visit.   Past Medical/Surgical History: Past Medical History:  Diagnosis Date  . Arthritis    "joints ache" (01/02/2014)  . At risk for sleep apnea    STOP-BANG= 4    SENT TO PCP 12-23-2013  . Bladder calculi   . CHF (congestive heart failure) ([place])   . Chronic cystitis   . Coronary artery disease CARDIOLOGIST-  DR Grace Bushy  MI -- S/P  11/89 CABG x6 (70% circ; 90% PD; 60-70% distal left main; 30% left circ; 90% 1st diag;, 2nd diag and 3rd diag. w/90%,  mild stenosis LAD and 60-70% pLAD)  Re-do CABG x5 in 1997  . Diverticulosis   . GERD (gastroesophageal reflux disease)   . History of Bell's palsy    RIGHT SIDE-- NO RESIDUAL  . Hx of dizziness   . Hyperlipidemia   . Hypertension    "not anymore" (01/02/2014)  . Ischemic cardiomyopathy    ef 35-40% per cath 08-28-2013  . Kidney stones "years ago"   "passed them"  . Melanoma of ear ([place])    "right"  . Mild dementia ([place])   . Myocardial infarction ([place])  1986; 1997  . Nocturia   . OAB (overactive bladder)   . Prostate cancer ([place]) 1998   S/P  [place]; [place]  . S/P CABG (coronary artery bypass graft)    [place]  . Skipped heart beats    occasional  . Urethral stricture   . UTI (urinary tract infection) 09/2015  . Wears hearing aid    bilateral  . Wears partial dentures     Past Surgical History:  Procedure Laterality Date  . CARDIAC CATHETERIZATION  02-22-2005  dr Vidal Schwalbe   mild to moderate lv dysfunction with inferobasilar akinesis/  totally occluded SVG to Intermediate Diagonal and SVG to PDA and RCA branches, totally occluded native coronary circulation with diffuse disease pLAD diagonal system with potentially could be ischemic/  patent SVG to OM with collaterals to dRCA and patent LIMA to LAD and diagonal systemss  . CARDIAC CATHETERIZATION  08-28-2013  DR Daneen Schick   widely patent sequential left internal graft to the diagonal/LAD, widely patent SVG to OM with proximal 50% narrowing noted in the graft, total occlusion SVG's to RCA, RI, and the Diagonal/ LV dysfunction with inferobasal aneurysm and mid anterior wall region of akinesis/  overall EF 35-40%/  Total occlusion of the navtive circulation/  no significant change compared to 2006 cath  . CARDIOVASCULAR STRESS TEST  08-01-2011  dr Angelena Form   inferior scar and possible soft tissue attenuation with minimal peri-infarct ischemia, small region of anterior ischemia and scar/  LVEF 53% LV wall motion with inferior hypokinesis/ no significant change from scan july 2011  . CATARACT EXTRACTION W/ INTRAOCULAR LENS  IMPLANT, BILATERAL Bilateral 2007  . CORONARY ARTERY BYPASS GRAFT  11/ [place]-- 6 vessel/  1997 Re-do 5 vessel  . CYSTOSCOPY WITH URETHRAL DILATATION N/A 12/25/2013   Procedure: CYSTOSCOPY WITH URETHRAL DILATATION, WITH BIOPSY;  Surgeon: Bernestine Amass, MD;  Location[facility];  Service:  Urology;  Laterality: N/A;  . CYSTOSCOPY WITH URETHRAL DILATATION N/A 12/22/2014   Procedure: CYSTOSCOPY WITH URETHRAL DILATATION;  Surgeon: Rana Snare, MD;  Location: [facility];  Service: Urology;  Laterality: N/A;  BALLOON DILATION CATHETER    . EUS  10/05/2011   Procedure: ESOPHAGEAL ENDOSCOPIC ULTRASOUND (EUS) RADIAL;  Surgeon: Arta Silence, MD;  Location: [facility];  Service: Endoscopy;  Laterality: N/A;  . INSERTION OF SUPRAPUBIC CATHETER N/A 12/22/2014   Procedure: INSERTION OF SUPRAPUBIC CATHETER ;  Surgeon: Rana Snare, MD;  Location: [facility];  Service: Urology;  Laterality: N/A;  . LAPAROSCOPIC CHOLECYSTECTOMY  12-23-2005  . LEFT HEART CATHETERIZATION WITH CORONARY/GRAFT ANGIOGRAM N/A 08/28/2013   Procedure: LEFT HEART CATHETERIZATION WITH Beatrix Fetters;  Surgeon: Sinclair Grooms, MD;  Location: [facility];  Service: Cardiovascular;  Laterality: N/A;  . MELANOMA EXCISION  X 1   "ear"  . RADIOACTIVE PROSTATE SEED IMPLANTS  1998  . SKIN CANCER EXCISION  X 2   "top of head"  . TONSILLECTOMY AND ADENOIDECTOMY  1954    Social History:  reports that he quit smoking about 34 years ago. His smoking use included cigars. He quit after 40.00 years of use. He quit smokeless tobacco use about 4 years ago.  His smokeless tobacco use included chew. He reports that he does not drink alcohol or use drugs.  Allergies: Allergies  Allergen Reactions  . Pantoprazole Other (See Comments)    Headache and lightheaded  . Nitrofurantoin Hives  . Sulfa Antibiotics Hives and Itching  . Lidocaine Swelling    Mouth swelling   . Tape Other (See Comments)    TAPE WILL TEAR THE SKIN!!!    Family History:  Family History  Problem Relation Age of Onset  . Heart disease Mother   . Diabetes Father   . Heart disease Brother      Current Outpatient Medications:  .  acetaminophen (TYLENOL) 500 MG tablet, Take 1,000 mg by mouth every 6 (six) hours as needed for pain or fever. , Disp: ,  Rfl:  .  apixaban (ELIQUIS) 5 MG TABS tablet, Take 1 tablet (5 mg total) by mouth 2 (two) times daily., Disp: 60 tablet, Rfl: 11 .  atorvastatin (LIPITOR) 80 MG tablet, Take 0.5 tablets (40 mg total) by mouth every evening., Disp: , Rfl:  .  Calcium Carbonate Antacid (TUMS PO), Take 3 tablets by mouth daily as needed (gas)., Disp: , Rfl:  .  furosemide (LASIX) 20 MG tablet, Take 1 tablet (20 mg total) by mouth daily as needed. For swelling, Disp: 30 tablet, Rfl: 6 .  galantamine (RAZADYNE ER) 8 MG 24 hr capsule, Take 8 mg by mouth daily with breakfast., Disp: , Rfl:  .  hydroxypropyl methylcellulose (ISOPTO TEARS) 2.5 % ophthalmic solution, Place 1 drop into both eyes 2 (two) times daily. , Disp: , Rfl:  .  Multiple Vitamin (MULTIVITAMIN WITH MINERALS) TABS, Take 1 tablet by mouth daily., Disp: , Rfl:  .  nitroGLYCERIN (NITROSTAT) 0.4 MG SL tablet, Place 0.4 mg under the tongue every 5 (five) minutes as needed for chest pain. , Disp: , Rfl:  .  ranitidine (ZANTAC) 150 MG tablet, Take 1 tablet (150 mg total) by mouth 2 (two) times daily., Disp: , Rfl:   Review of Systems: Constitutional: Denies fever, chills, diaphoresis, appetite change and fatigue.  HEENT: Denies photophobia, eye pain, redness, hearing loss, ear pain, congestion, sore throat, rhinorrhea, sneezing, mouth sores, trouble swallowing, neck pain, neck stiffness and tinnitus.   Respiratory: Denies SOB, DOE, cough, chest tightness,  and wheezing.   Cardiovascular: Denies chest pain, palpitations and leg swelling.  Gastrointestinal: Denies nausea, vomiting, abdominal pain, diarrhea, constipation, blood in stool and abdominal distention.  Genitourinary: Denies dysuria, urgency, frequency, hematuria, flank pain and difficulty urinating.  Endocrine: Denies: hot or cold intolerance, sweats, changes in hair or nails, polyuria, polydipsia. Musculoskeletal: Denies myalgias, back pain, joint swelling, arthralgias and gait problem.  Skin: Denies  pallor, rash and wound.  Neurological: Denies dizziness, seizures, syncope, weakness, light-headedness, numbness and headaches.  Hematological: Denies adenopathy. Easy bruising, personal or family bleeding history  Psychiatric/Behavioral: Denies suicidal ideation, mood changes, confusion, nervousness, sleep disturbance and agitation    Physical Exam: Vitals:   02/28/18 1129  BP: 120/80  Pulse: (!) 50  Temp: (!) 97.5 F (36.4 C)  TempSrc: Oral  SpO2: 98%  Weight: 157 lb 14.4 oz (71.6 kg)    Body mass index is 26.28 kg/m.   Constitutional: NAD, calm, comfortable Eyes: PERRL, lids and conjunctivae normal ENMT: Mucous membranes are moist. Posterior pharynx clear of any exudate or lesions. Normal dentition.  Neck: normal, supple, no masses, no thyromegaly Respiratory: clear to auscultation bilaterally, no wheezing, no crackles. Normal respiratory effort. No accessory muscle use.  Cardiovascular: Irregularly irregular, no murmurs / rubs / gallops. No extremity edema. 2+ pedal pulses. No carotid bruits.  Abdomen: no tenderness, no masses palpated. No hepatosplenomegaly. Bowel sounds positive.  Musculoskeletal: no clubbing / cyanosis. No joint deformity upper and lower extremities. Good ROM, no contractures. Normal muscle tone.  Skin: no rashes, lesions, ulcers. No induration Neurologic: CN 2-12 grossly intact. Sensation intact, DTR normal. Strength 5/5 in all 4.  Psychiatric: Normal judgment and insight. Alert and oriented x 3. Normal mood.    Impression and Plan:  HYPERCHOLESTEROLEMIA -Well-controlled, LDL 76 in February 2019, and atorvastatin 40 mg.  Essential hypertension -Well-controlled  Chronic systolic CHF (congestive heart failure) (HCC)/ischemic cardiomyopathy Coronary artery disease involving coronary bypass graft of native heart without angina pectoris -Stable by cath in 2015, followed by cardiology. -Echo from November 2018: Ejection fraction of 50 to 55%, normal  wall motion, normal left ventricular diastolic function parameters. -Unable to tolerate ACE inhibitor or beta-blocker due to hypotension.  CKD (chronic kidney disease), stage II- III (HCC) -Baseline creatinine between 1-1.1. -Stable as per last check in October/19.  Suprapubic catheter ([place]) -Draining, did need to go to the emergency department last month as catheter fell out. -Exchanged at urology office every 4 weeks.  He is due for his annual physical exam.  At that time would like CBC with differential, CMP, vitamin D, vitamin B12, TSH and lipids to be drawn.   Patient Instructions  -It was nice to meet you!  -Please schedule follow-up as soon as possible for your annual Medicare wellness visit with our wellness coach.  Please come fasting to that  visit.  -Please follow-up with me in 4 months.     Lelon Frohlich, MD [place] [facility] [place]

## 2018-02-28 NOTE — Patient Instructions (Signed)
-  It was nice to meet you!  -Please schedule follow-up as soon as possible for your annual Medicare wellness visit with our wellness coach.  Please come fasting to that visit.  -Please follow-up with me in 4 months.

## 2018-03-05 ENCOUNTER — Other Ambulatory Visit: Payer: Self-pay

## 2018-03-05 ENCOUNTER — Inpatient Hospital Stay (HOSPITAL_COMMUNITY)
Admission: EM | Admit: 2018-03-05 | Discharge: 2018-03-08 | DRG: 698 | Disposition: A | Discharge status: Home Health | Payer: Medicare Other | Attending: Internal Medicine | Admitting: Internal Medicine

## 2018-03-05 ENCOUNTER — Emergency Department (HOSPITAL_COMMUNITY): Payer: Medicare Other

## 2018-03-05 ENCOUNTER — Encounter (HOSPITAL_COMMUNITY): Payer: Self-pay | Admitting: Emergency Medicine

## 2018-03-05 DIAGNOSIS — F0390 Unspecified dementia without behavioral disturbance: Secondary | ICD-10-CM | POA: Diagnosis present

## 2018-03-05 DIAGNOSIS — I25810 Atherosclerosis of coronary artery bypass graft(s) without angina pectoris: Secondary | ICD-10-CM | POA: Diagnosis present

## 2018-03-05 DIAGNOSIS — B961 Klebsiella pneumoniae [K. pneumoniae] as the cause of diseases classified elsewhere: Secondary | ICD-10-CM | POA: Diagnosis present

## 2018-03-05 DIAGNOSIS — Z87442 Personal history of urinary calculi: Secondary | ICD-10-CM

## 2018-03-05 DIAGNOSIS — D72829 Elevated white blood cell count, unspecified: Secondary | ICD-10-CM | POA: Diagnosis not present

## 2018-03-05 DIAGNOSIS — R509 Fever, unspecified: Secondary | ICD-10-CM | POA: Diagnosis not present

## 2018-03-05 DIAGNOSIS — Z87891 Personal history of nicotine dependence: Secondary | ICD-10-CM

## 2018-03-05 DIAGNOSIS — N3001 Acute cystitis with hematuria: Secondary | ICD-10-CM | POA: Diagnosis not present

## 2018-03-05 DIAGNOSIS — I5042 Chronic combined systolic (congestive) and diastolic (congestive) heart failure: Secondary | ICD-10-CM | POA: Diagnosis present

## 2018-03-05 DIAGNOSIS — Z79899 Other long term (current) drug therapy: Secondary | ICD-10-CM

## 2018-03-05 DIAGNOSIS — N179 Acute kidney failure, unspecified: Secondary | ICD-10-CM | POA: Diagnosis present

## 2018-03-05 DIAGNOSIS — I130 Hypertensive heart and chronic kidney disease with heart failure and stage 1 through stage 4 chronic kidney disease, or unspecified chronic kidney disease: Secondary | ICD-10-CM | POA: Diagnosis not present

## 2018-03-05 DIAGNOSIS — I480 Paroxysmal atrial fibrillation: Secondary | ICD-10-CM | POA: Diagnosis present

## 2018-03-05 DIAGNOSIS — Z951 Presence of aortocoronary bypass graft: Secondary | ICD-10-CM

## 2018-03-05 DIAGNOSIS — R531 Weakness: Secondary | ICD-10-CM | POA: Diagnosis not present

## 2018-03-05 DIAGNOSIS — N183 Chronic kidney disease, stage 3 unspecified: Secondary | ICD-10-CM | POA: Diagnosis present

## 2018-03-05 DIAGNOSIS — A419 Sepsis, unspecified organism: Secondary | ICD-10-CM | POA: Diagnosis present

## 2018-03-05 DIAGNOSIS — I5032 Chronic diastolic (congestive) heart failure: Secondary | ICD-10-CM | POA: Diagnosis present

## 2018-03-05 DIAGNOSIS — I252 Old myocardial infarction: Secondary | ICD-10-CM

## 2018-03-05 DIAGNOSIS — K219 Gastro-esophageal reflux disease without esophagitis: Secondary | ICD-10-CM | POA: Diagnosis present

## 2018-03-05 DIAGNOSIS — I2510 Atherosclerotic heart disease of native coronary artery without angina pectoris: Secondary | ICD-10-CM | POA: Diagnosis present

## 2018-03-05 DIAGNOSIS — E785 Hyperlipidemia, unspecified: Secondary | ICD-10-CM | POA: Diagnosis present

## 2018-03-05 DIAGNOSIS — B9561 Methicillin susceptible Staphylococcus aureus infection as the cause of diseases classified elsewhere: Secondary | ICD-10-CM | POA: Diagnosis present

## 2018-03-05 DIAGNOSIS — N390 Urinary tract infection, site not specified: Secondary | ICD-10-CM | POA: Diagnosis present

## 2018-03-05 DIAGNOSIS — T83510A Infection and inflammatory reaction due to cystostomy catheter, initial encounter: Secondary | ICD-10-CM

## 2018-03-05 DIAGNOSIS — Z85820 Personal history of malignant melanoma of skin: Secondary | ICD-10-CM

## 2018-03-05 DIAGNOSIS — Z7901 Long term (current) use of anticoagulants: Secondary | ICD-10-CM

## 2018-03-05 DIAGNOSIS — Z87440 Personal history of urinary (tract) infections: Secondary | ICD-10-CM

## 2018-03-05 DIAGNOSIS — R000 Tachycardia, unspecified: Secondary | ICD-10-CM | POA: Diagnosis not present

## 2018-03-05 DIAGNOSIS — Y846 Urinary catheterization as the cause of abnormal reaction of the patient, or of later complication, without mention of misadventure at the time of the procedure: Secondary | ICD-10-CM | POA: Diagnosis present

## 2018-03-05 DIAGNOSIS — T83518A Infection and inflammatory reaction due to other urinary catheter, initial encounter: Principal | ICD-10-CM | POA: Diagnosis present

## 2018-03-05 DIAGNOSIS — I255 Ischemic cardiomyopathy: Secondary | ICD-10-CM | POA: Diagnosis present

## 2018-03-05 DIAGNOSIS — I214 Non-ST elevation (NSTEMI) myocardial infarction: Secondary | ICD-10-CM | POA: Diagnosis present

## 2018-03-05 DIAGNOSIS — Z8546 Personal history of malignant neoplasm of prostate: Secondary | ICD-10-CM

## 2018-03-05 NOTE — ED Provider Notes (Signed)
TIME SEEN: 11:27 PM  CHIEF COMPLAINT: fever  HPI: Patient is a 84 y.o. male with history of CAD, CHF, hypertension, hyperlipidemia, suprapubic catheter with history of urosepsis who presents to the emergency department with fever that started tonight.  Fever at home was 100.7.  Gave Tylenol by wife prior to arrival.  Having some pain around his suprapubic catheter site.  Last exchanged 1 month ago.  No cough.  No vomiting.  No diarrhea.  ROS: See HPI Constitutional:  fever  Eyes: no drainage  ENT: no runny nose   Cardiovascular:  no chest pain  Resp: no SOB  GI: no vomiting GU: no dysuria Integumentary: no rash  Allergy: no hives  Musculoskeletal: no leg swelling  Neurological: no slurred speech ROS otherwise negative  PAST MEDICAL HISTORY/PAST SURGICAL HISTORY:  Past Medical History:  Diagnosis Date  . Arthritis    "joints ache" (01/02/2014)  . At risk for sleep apnea    STOP-BANG= 4    SENT TO PCP 12-23-2013  . Bladder calculi   . CHF (congestive heart failure) ([place])   . Chronic cystitis   . Coronary artery disease CARDIOLOGIST-  DR Grace Bushy  MI -- S/P  11/89 CABG x6 (70% circ; 90% PD; 60-70% distal left main; 30% left circ; 90% 1st diag;, 2nd diag and 3rd diag. w/90%,  mild stenosis LAD and 60-70% pLAD)  Re-do CABG x5 in 1997  . Diverticulosis   . GERD (gastroesophageal reflux disease)   . History of Bell's palsy    RIGHT SIDE-- NO RESIDUAL  . Hx of dizziness   . Hyperlipidemia   . Hypertension    "not anymore" (01/02/2014)  . Ischemic cardiomyopathy    ef 35-40% per cath 08-28-2013  . Kidney stones "years ago"   "passed them"  . Melanoma of ear ([place])    "right"  . Mild dementia ([place])   . Myocardial infarction ([place]) 1986; 1997  . Nocturia   . OAB (overactive bladder)   . Prostate cancer ([place]) 1998   S/P  [place]; [place]  . S/P CABG (coronary artery bypass graft)    [place]  . Skipped heart beats    occasional   . Urethral stricture   . UTI (urinary tract infection) 09/2015  . Wears hearing aid    bilateral  . Wears partial dentures     MEDICATIONS:  Prior to Admission medications   Medication Sig Start Date End Date Taking? Authorizing Provider  acetaminophen (TYLENOL) 500 MG tablet Take 1,000 mg by mouth every 6 (six) hours as needed for pain or fever.     [provider]  apixaban (ELIQUIS) 5 MG TABS tablet Take 1 tablet (5 mg total) by mouth 2 (two) times daily. 05/10/17   Burnell Blanks, MD  atorvastatin (LIPITOR) 80 MG tablet Take 0.5 tablets (40 mg total) by mouth every evening. 05/24/17   Marletta Lor, MD  Calcium Carbonate Antacid (TUMS PO) Take 3 tablets by mouth daily as needed (gas).    [provider]  furosemide (LASIX) 20 MG tablet Take 1 tablet (20 mg total) by mouth daily as needed. For swelling 11/29/17   Burnell Blanks, MD  galantamine (RAZADYNE ER) 8 MG 24 hr capsule Take 8 mg by mouth daily with breakfast.    [provider]  hydroxypropyl methylcellulose (ISOPTO TEARS) 2.5 % ophthalmic solution Place 1 drop into both eyes 2 (two) times  daily.     [provider]  Multiple Vitamin (MULTIVITAMIN WITH MINERALS) TABS Take 1 tablet by mouth daily.    [provider]  nitroGLYCERIN (NITROSTAT) 0.4 MG SL tablet Place 0.4 mg under the tongue every 5 (five) minutes as needed for chest pain.     [provider]  ranitidine (ZANTAC) 150 MG tablet Take 1 tablet (150 mg total) by mouth 2 (two) times daily. 05/18/16   Burnell Blanks, MD    ALLERGIES:  Allergies  Allergen Reactions  . Pantoprazole Other (See Comments)    Headache and lightheaded  . Nitrofurantoin Hives  . Sulfa Antibiotics Hives and Itching  . Lidocaine Swelling    Mouth swelling   . Tape Other (See Comments)    TAPE WILL TEAR THE SKIN!!!    SOCIAL HISTORY:  Social History   Tobacco Use  . Smoking status: Former Smoker     Years: 40.00    Types: Cigars    Last attempt to quit: 12/24/1983    Years since quitting: 34.2  . Smokeless tobacco: Former Systems developer    Types: Chew    Quit date: 08/28/2013  . Tobacco comment: smoked a pipe  Substance Use Topics  . Alcohol use: No    FAMILY HISTORY: Family History  Problem Relation Age of Onset  . Heart disease Mother   . Diabetes Father   . Heart disease Brother     EXAM: BP (!) 123/59 (BP Location: Right Arm)   Pulse (!) 59   Temp 97.6 F (36.4 C) (Oral)   Resp 18   SpO2 94%  CONSTITUTIONAL: Alert and oriented and responds appropriately to questions. Well-appearing; elderly, in no distress HEAD: Normocephalic EYES: Conjunctivae clear, pupils appear equal, EOMI ENT: normal nose; moist mucous membranes NECK: Supple, no meningismus, no nuchal rigidity, no LAD  CARD: RRR; S1 and S2 appreciated; no murmurs, no clicks, no rubs, no gallops RESP: Normal chest excursion without splinting or tachypnea; breath sounds clear and equal bilaterally; no wheezes, no rhonchi, no rales, no hypoxia or respiratory distress, speaking full sentences ABD/GI: Normal bowel sounds; non-distended; soft, non-tender, no rebound, no guarding, no peritoneal signs, no hepatosplenomegaly, suprapubic catheter noted to the lower abdomen without surrounding redness or warmth or drainage BACK:  The back appears normal and is non-tender to palpation, there is no CVA tenderness EXT: Normal ROM in all joints; non-tender to palpation; no edema; normal capillary refill; no cyanosis, no calf tenderness or swelling    SKIN: Normal color for age and race; warm; no rash NEURO: Moves all extremities equally PSYCH: The patient's mood and manner are appropriate. Grooming and personal hygiene are appropriate.  MEDICAL DECISION MAKING: Patient here with fever.  Has had a history of urosepsis.  Will obtain labs, chest x-ray, urine, cultures.  Well-appearing at this time, well-hydrated, nontoxic with normal vital  signs.  It appears per his records patient has previously had urosepsis and bacteremia.  Urine cultures frequently grows staph aureus and E. coli.  He has had blood cultures positive for Pseudomonas in the past.  ED PROGRESS: Urine appears infected again today.  Chest x-ray is clear.  Labs show mild leukocytosis.  Lactate is normal.  Discussed with pharmacy who recommends repeating IV meropenem given patient has had some resistant bacteria in the past and this is what he received during his last admission in October.  Will discuss with hospitalist.    2:02 AM Discussed patient's case with hospitalist, Dr. Alcario Drought.  I have  recommended admission and patient (and family if present) agree with this plan.  I will place holding orders for telemetry, observation per hospitalist recommendations.  I reviewed all nursing notes, vitals, pertinent previous records, EKGs, lab and urine results, imaging (as available).      Sally-Anne Wamble, Delice Bison, DO 03/06/18 [phone]

## 2018-03-05 NOTE — ED Notes (Signed)
ED Provider at bedside. 

## 2018-03-05 NOTE — ED Triage Notes (Signed)
Pt arrives to ED from home with complaints of chills and a fever since 2100. EMS reports pt wife gave him one 325mg  tylenol at home. Pt states he feels like his fever broke. Temp at triage is 97.75F oral pt alert and oriented x 4. pt has suprapubic catheter. Pt placed in position of comfort with bed locked and lowered, call bell in reach.

## 2018-03-06 ENCOUNTER — Encounter (HOSPITAL_COMMUNITY): Payer: Self-pay | Admitting: General Practice

## 2018-03-06 DIAGNOSIS — I214 Non-ST elevation (NSTEMI) myocardial infarction: Secondary | ICD-10-CM

## 2018-03-06 DIAGNOSIS — I5032 Chronic diastolic (congestive) heart failure: Secondary | ICD-10-CM

## 2018-03-06 DIAGNOSIS — T83510A Infection and inflammatory reaction due to cystostomy catheter, initial encounter: Secondary | ICD-10-CM | POA: Diagnosis not present

## 2018-03-06 DIAGNOSIS — I25810 Atherosclerosis of coronary artery bypass graft(s) without angina pectoris: Secondary | ICD-10-CM

## 2018-03-06 DIAGNOSIS — I480 Paroxysmal atrial fibrillation: Secondary | ICD-10-CM | POA: Diagnosis not present

## 2018-03-06 DIAGNOSIS — N390 Urinary tract infection, site not specified: Secondary | ICD-10-CM | POA: Diagnosis not present

## 2018-03-06 LAB — INFLUENZA PANEL BY PCR (TYPE A & B)
Influenza A By PCR: NEGATIVE
Influenza B By PCR: NEGATIVE

## 2018-03-06 LAB — CBC WITH DIFFERENTIAL/PLATELET
Abs Immature Granulocytes: 0.06 10*3/uL (ref 0.00–0.07)
Basophils Absolute: 0 10*3/uL (ref 0.0–0.1)
Basophils Relative: 0 %
Eosinophils Absolute: 0 10*3/uL (ref 0.0–0.5)
Eosinophils Relative: 0 %
HCT: 42.2 % (ref 39.0–52.0)
Hemoglobin: 13.1 g/dL (ref 13.0–17.0)
Immature Granulocytes: 1 %
Lymphocytes Relative: 4 %
Lymphs Abs: 0.5 10*3/uL — ABNORMAL LOW (ref 0.7–4.0)
MCH: 28.5 pg (ref 26.0–34.0)
MCHC: 31 g/dL (ref 30.0–36.0)
MCV: 91.7 fL (ref 80.0–100.0)
Monocytes Absolute: 1.1 10*3/uL — ABNORMAL HIGH (ref 0.1–1.0)
Monocytes Relative: 9 %
Neutro Abs: 11.5 10*3/uL — ABNORMAL HIGH (ref 1.7–7.7)
Neutrophils Relative %: 86 %
Platelets: 161 10*3/uL (ref 150–400)
RBC: 4.6 MIL/uL (ref 4.22–5.81)
RDW: 13.6 % (ref 11.5–15.5)
WBC: 13.2 10*3/uL — ABNORMAL HIGH (ref 4.0–10.5)
nRBC: 0 % (ref 0.0–0.2)

## 2018-03-06 LAB — URINALYSIS, ROUTINE W REFLEX MICROSCOPIC
Bilirubin Urine: NEGATIVE
Glucose, UA: NEGATIVE mg/dL
Ketones, ur: NEGATIVE mg/dL
Nitrite: POSITIVE — AB
Protein, ur: 100 mg/dL — AB
Specific Gravity, Urine: 1.02 (ref 1.005–1.030)
pH: 6 (ref 5.0–8.0)

## 2018-03-06 LAB — BLOOD CULTURE ID PANEL (REFLEXED)

## 2018-03-06 LAB — COMPREHENSIVE METABOLIC PANEL
ALT: 19 U/L (ref 0–44)
AST: 24 U/L (ref 15–41)
Albumin: 3.8 g/dL (ref 3.5–5.0)
Alkaline Phosphatase: 73 U/L (ref 38–126)
Anion gap: 11 (ref 5–15)
BUN: 26 mg/dL — ABNORMAL HIGH (ref 8–23)
CO2: 23 mmol/L (ref 22–32)
Calcium: 9.5 mg/dL (ref 8.9–10.3)
Chloride: 103 mmol/L (ref 98–111)
Creatinine, Ser: 1.57 mg/dL — ABNORMAL HIGH (ref 0.61–1.24)
GFR calc Af Amer: 45 mL/min — ABNORMAL LOW (ref >60)
GFR calc non Af Amer: 39 mL/min — ABNORMAL LOW (ref >60)
Glucose, Bld: 142 mg/dL — ABNORMAL HIGH (ref 70–99)
Potassium: 4 mmol/L (ref 3.5–5.1)
Sodium: 137 mmol/L (ref 135–145)
Total Bilirubin: 0.9 mg/dL (ref 0.3–1.2)
Total Protein: 6.6 g/dL (ref 6.5–8.1)

## 2018-03-06 LAB — URINALYSIS, MICROSCOPIC (REFLEX)
RBC / HPF: >50 RBC/hpf (ref 0–5)
WBC, UA: >50 WBC/hpf (ref 0–5)

## 2018-03-06 LAB — I-STAT CG4 LACTIC ACID, ED: Lactic Acid, Venous: 1.46 mmol/L (ref 0.5–1.9)

## 2018-03-06 MED ORDER — FAMOTIDINE 20 MG PO TABS
20.0000 mg | ORAL_TABLET | Freq: Every day | ORAL | Status: DC
  Administered 2018-03-06 – 2018-03-08 (×3): 20 mg via ORAL
  Filled 2018-03-06 (×3): qty 1

## 2018-03-06 MED ORDER — BISACODYL 5 MG PO TBEC: 5.0000 mg | DELAYED_RELEASE_TABLET | Freq: Every day | ORAL | Status: DC | PRN

## 2018-03-06 MED ORDER — LACTATED RINGERS IV SOLN
INTRAVENOUS | Status: DC
  Administered 2018-03-06: 08:00:00 via INTRAVENOUS

## 2018-03-06 MED ORDER — SODIUM CHLORIDE 0.9 % IV SOLN
2.0000 g | INTRAVENOUS | Status: DC
  Administered 2018-03-07 – 2018-03-08 (×2): 2 g via INTRAVENOUS
  Filled 2018-03-06 (×2): qty 20

## 2018-03-06 MED ORDER — ADULT MULTIVITAMIN W/MINERALS CH
1.0000 | ORAL_TABLET | Freq: Every day | ORAL | Status: DC
  Administered 2018-03-06 – 2018-03-08 (×3): 1 via ORAL
  Filled 2018-03-06 (×3): qty 1

## 2018-03-06 MED ORDER — SODIUM CHLORIDE 0.9% FLUSH
3.0000 mL | Freq: Two times a day (BID) | INTRAVENOUS | Status: DC
  Administered 2018-03-06 – 2018-03-08 (×4): 3 mL via INTRAVENOUS

## 2018-03-06 MED ORDER — POLYETHYL GLYCOL-PROPYL GLYCOL 0.4-0.3 % OP SOLN: 1.0000 [drp] | Freq: Three times a day (TID) | OPHTHALMIC | Status: DC

## 2018-03-06 MED ORDER — ATORVASTATIN CALCIUM 40 MG PO TABS
40.0000 mg | ORAL_TABLET | Freq: Every evening | ORAL | Status: DC
  Administered 2018-03-06 – 2018-03-07 (×2): 40 mg via ORAL
  Filled 2018-03-06 (×2): qty 1

## 2018-03-06 MED ORDER — SODIUM CHLORIDE 0.9 % IV SOLN
1.0000 g | Freq: Two times a day (BID) | INTRAVENOUS | Status: DC
  Administered 2018-03-06 (×2): 1 g via INTRAVENOUS
  Filled 2018-03-06 (×3): qty 1

## 2018-03-06 MED ORDER — NITROGLYCERIN 0.4 MG SL SUBL: 0.4000 mg | SUBLINGUAL_TABLET | SUBLINGUAL | Status: DC | PRN

## 2018-03-06 MED ORDER — ACETAMINOPHEN 650 MG RE SUPP: 650.0000 mg | Freq: Four times a day (QID) | RECTAL | Status: DC | PRN

## 2018-03-06 MED ORDER — HYPROMELLOSE (GONIOSCOPIC) 2.5 % OP SOLN
1.0000 [drp] | Freq: Two times a day (BID) | OPHTHALMIC | Status: DC
  Filled 2018-03-06: qty 15

## 2018-03-06 MED ORDER — ACETAMINOPHEN 325 MG PO TABS
650.0000 mg | ORAL_TABLET | Freq: Four times a day (QID) | ORAL | Status: DC | PRN
  Administered 2018-03-06: 650 mg via ORAL
  Filled 2018-03-06: qty 2

## 2018-03-06 MED ORDER — APIXABAN 2.5 MG PO TABS
2.5000 mg | ORAL_TABLET | Freq: Two times a day (BID) | ORAL | Status: DC
  Administered 2018-03-06 (×2): 2.5 mg via ORAL
  Filled 2018-03-06 (×2): qty 1

## 2018-03-06 MED ORDER — POLYVINYL ALCOHOL 1.4 % OP SOLN
1.0000 [drp] | Freq: Two times a day (BID) | OPHTHALMIC | Status: DC
  Administered 2018-03-06 – 2018-03-07 (×3): 1 [drp] via OPHTHALMIC
  Filled 2018-03-06 (×2): qty 15

## 2018-03-06 MED ORDER — SODIUM CHLORIDE 0.9 % IV SOLN: 1.0000 g | INTRAVENOUS | Status: DC

## 2018-03-06 NOTE — Evaluation (Signed)
Physical Therapy Evaluation Patient Details Name: Ronald [name] MRN: [id number] DOB: 08-Apr-1927 Today's Date: 03/06/2018   History of Present Illness  Pt is a 84 y.o. male admitted 03/05/18 with fever and chills, UTI. PMH includes HTN, HLD, CAD, CABG, CHF, dementia, CKD-3, prostate cancer, PAF on Eliquis.  Clinical Impression  Pt pleasant with wife present throughout session. Pt reports 1 week of progressive fatigue and weakness to the point he was unable to stand immediately PTA. Pt required assist rise from supine to sitting and to initiate mobility this session with decreased activity tolerance, strength and function who will benefit from acute therapy to maximize independence and strength to decrease burden of care.     Follow Up Recommendations Home health PT    Equipment Recommendations  None recommended by PT    Recommendations for Other Services       Precautions / Restrictions Precautions Precautions: Fall Precaution Comments: incontinent of stool, suprapubic catheter      Mobility  Bed Mobility Overal bed mobility: Needs Assistance Bed Mobility: Supine to Sit     Supine to sit: Min assist     General bed mobility comments: min assist to fully elevate trunk and scoot to eOB, cues for sequence, increased time  Transfers Overall transfer level: Needs assistance   Transfers: Sit to/from Stand Sit to Stand: Min guard         General transfer comment: cues for hand placement, use of rail and increased time at toilet  Ambulation/Gait Ambulation/Gait assistance: Min guard Gait Distance (Feet): 80 Feet Assistive device: Rolling walker (2 wheeled) Gait Pattern/deviations: Step-through pattern;Decreased stride length;Trunk flexed   Gait velocity interpretation: 1.31 - 2.62 ft/sec, indicative of limited community ambulator General Gait Details: cues to increase proximity to Rw and extend trunk  Stairs            Wheelchair Mobility    Modified Rankin  (Stroke Patients Only)       Balance Overall balance assessment: Needs assistance   Sitting balance-Leahy Scale: Poor Sitting balance - Comments: pt required min assist to initially gain balance but then able to sit minguard at bed and toilet                                     Pertinent Vitals/Pain Pain Assessment: No/denies pain    Home Living Family/patient expects to be discharged to:: Private residence Living Arrangements: Spouse/significant other Available Help at Discharge: Family;Available 24 hours/day Type of Home: House Home Access: Stairs to enter Entrance Stairs-Rails: Left Entrance Stairs-Number of Steps: 3 Home Layout: One level Home Equipment: Walker - 2 wheels;Cane - single point      Prior Function Level of Independence: Independent with assistive device(s)         Comments: SPC for household distances     Hand Dominance        Extremity/Trunk Assessment   Upper Extremity Assessment Upper Extremity Assessment: Generalized weakness    Lower Extremity Assessment Lower Extremity Assessment: Generalized weakness    Cervical / Trunk Assessment Cervical / Trunk Assessment: Kyphotic  Communication   Communication: HOH  Cognition Arousal/Alertness: Awake/alert Behavior During Therapy: WFL for tasks assessed/performed Overall Cognitive Status: Within Functional Limits for tasks assessed  General Comments      Exercises     Assessment/Plan    PT Assessment Patient needs continued PT services  PT Problem List Decreased strength;Decreased mobility;Decreased activity tolerance;Decreased balance;Decreased knowledge of use of DME       PT Treatment Interventions Gait training;Therapeutic activities;Stair training;Therapeutic exercise;DME instruction;Functional mobility training;Balance training;Patient/family education    PT Goals (Current goals can be found in the Care  Plan section)  Acute Rehab PT Goals Patient Stated Goal: return home PT Goal Formulation: With patient/family Time For Goal Achievement: 03/20/18 Potential to Achieve Goals: Good    Frequency Min 3X/week   Barriers to discharge        Co-evaluation               AM-PAC PT "6 Clicks" Mobility  Outcome Measure Help needed turning from your back to your side while in a flat bed without using bedrails?: A Little Help needed moving from lying on your back to sitting on the side of a flat bed without using bedrails?: A Little Help needed moving to and from a bed to a chair (including a wheelchair)?: A Little Help needed standing up from a chair using your arms (e.g., wheelchair or bedside chair)?: A Little Help needed to walk in hospital room?: A Little Help needed climbing 3-5 steps with a railing? : A Little 6 Click Score: 18    End of Session Equipment Utilized During Treatment: Gait belt Activity Tolerance: Patient tolerated treatment well Patient left: in chair;with call bell/phone within reach;with family/visitor present Nurse Communication: Mobility status PT Visit Diagnosis: Other abnormalities of gait and mobility (R26.89);Muscle weakness (generalized) (M62.81)    Time: 6440-3474 PT Time Calculation (min) (ACUTE ONLY): 25 min   Charges:   PT Evaluation $PT Eval Moderate Complexity: 1 Mod          [place], PT Acute Rehabilitation Services Pager: [phone] Office: [phone]   Thurman Sarver B Tylene Quashie 03/06/2018, 1:38 PM

## 2018-03-06 NOTE — Progress Notes (Signed)
PHARMACY - PHYSICIAN COMMUNICATION CRITICAL VALUE ALERT - BLOOD CULTURE IDENTIFICATION (BCID)  Ronald [name] is an 84 y.o. male who presented to [facility] on 03/05/2018 with a chief complaint of fever  Assessment:  Pt with chronic cath who presented with fever and sepsis from this urinary source. Labs called with BCID result today of kleb pneumo in 1/4. He was started on merrem with the concern for ESBL. Look back in his hx and didn't find an ESBL isolate.   Name of physician (or Provider) Contacted: Dr. Candiss Norse  Current antibiotics: Merrem  Changes to prescribed antibiotics recommended:  Change Merrem to ceftriaxone 2g IV q24  Results for orders placed or performed during the hospital encounter of 03/05/18  Blood Culture ID Panel (Reflexed) (Collected: 03/06/2018 12:01 AM)  Result Value Ref Range   Enterococcus species NOT DETECTED NOT DETECTED   Listeria monocytogenes NOT DETECTED NOT DETECTED   Staphylococcus species NOT DETECTED NOT DETECTED   Staphylococcus aureus (BCID) NOT DETECTED NOT DETECTED   Streptococcus species NOT DETECTED NOT DETECTED   Streptococcus agalactiae NOT DETECTED NOT DETECTED   Streptococcus pneumoniae NOT DETECTED NOT DETECTED   Streptococcus pyogenes NOT DETECTED NOT DETECTED   Acinetobacter baumannii NOT DETECTED NOT DETECTED   Enterobacteriaceae species DETECTED (A) NOT DETECTED   Enterobacter cloacae complex NOT DETECTED NOT DETECTED   Escherichia coli NOT DETECTED NOT DETECTED   Klebsiella oxytoca NOT DETECTED NOT DETECTED   Klebsiella pneumoniae DETECTED (A) NOT DETECTED   Proteus species NOT DETECTED NOT DETECTED   Serratia marcescens NOT DETECTED NOT DETECTED   Carbapenem resistance NOT DETECTED NOT DETECTED   Haemophilus influenzae NOT DETECTED NOT DETECTED   Neisseria meningitidis NOT DETECTED NOT DETECTED   Pseudomonas aeruginosa NOT DETECTED NOT DETECTED   Candida albicans NOT DETECTED NOT DETECTED   Candida glabrata NOT DETECTED NOT  DETECTED   Candida krusei NOT DETECTED NOT DETECTED   Candida parapsilosis NOT DETECTED NOT DETECTED   Candida tropicalis NOT DETECTED NOT DETECTED    Onnie Boer, PharmD, BCIDP, AAHIVP, CPP Infectious Disease Pharmacist 03/06/2018 2:32 PM

## 2018-03-06 NOTE — H&P (Signed)
TRH H&P   Patient Demographics:    Ronald [name], is a 84 y.o. male  MRN: [id number]   DOB - Mar 20, 1928  Admit Date - 03/05/2018  Outpatient Primary MD for the patient is Isaac Bliss, [name], MD    Patient coming from: Home  Chief Complaint  Patient presents with  . Fever      HPI:    Ronald [name]  is a 84 y.o. male, with H/O Prostate CA with indwelling Suprapubic Catheter (POA), past UTIs, Parox.Afib, CAD-CABG, chronic diastolic CHF with last EF 55%, early dementia, GERD, hypertension, dyslipidemia who lives at home with his wife brought in with 1 day history of fever and chills, he was also due for his suprapubic catheter today.  He denied any chest or abdominal pain, no other subjective complaints.  In the ER work-up was consistent with UTI caused by indwelling suprapubic catheter present on admission.  1 of my partners was called and he was kept in 23-hour observation for further care.    Review of systems:    A full 10 point Review of Systems was done, except as stated above, all other Review of Systems were negative.   With Past History of the following :    Past Medical History:  Diagnosis Date  . Arthritis    "joints ache" (01/02/2014)  . At risk for sleep apnea    STOP-BANG= 4    SENT TO PCP 12-23-2013  . Bladder calculi   . CHF (congestive heart failure) ([place])   . Chronic cystitis   . Coronary artery disease CARDIOLOGIST-  DR Grace Bushy  [place] -- S/P  11/89 CABG x6 (70% circ; 90% PD; 60-70% distal left main; 30% left circ; 90% 1st diag;, 2nd diag and 3rd diag. w/90%,  mild stenosis LAD and 60-70% pLAD)  Re-do CABG x5 in 1997  . Diverticulosis   . GERD (gastroesophageal reflux disease)     . History of Bell's palsy    RIGHT SIDE-- NO RESIDUAL  . Hx of dizziness   . Hyperlipidemia   . Hypertension    "not anymore" (01/02/2014)  . Ischemic cardiomyopathy    ef 35-40% per cath 08-28-2013  . Kidney stones "years ago"   "passed them"  . Melanoma of ear ([place])    "right"  . Mild dementia ([place])   .  Myocardial infarction ([place]) 1986; 1997  . Nocturia   . OAB (overactive bladder)   . Prostate cancer ([place]) 1998   S/P  [place]; [place]  . S/P CABG (coronary artery bypass graft)    [place]  . Skipped heart beats    occasional  . Urethral stricture   . UTI (urinary tract infection) 09/2015  . Wears hearing aid    bilateral  . Wears partial dentures       Past Surgical History:  Procedure Laterality Date  . CARDIAC CATHETERIZATION  02-22-2005  dr Vidal Schwalbe   mild to moderate lv dysfunction with inferobasilar akinesis/  totally occluded SVG to Intermediate Diagonal and SVG to PDA and RCA branches, totally occluded native coronary circulation with diffuse disease pLAD diagonal system with potentially could be ischemic/  patent SVG to OM with collaterals to dRCA and patent LIMA to LAD and diagonal systemss  . CARDIAC CATHETERIZATION  08-28-2013  DR Daneen Schick   widely patent sequential left internal graft to the diagonal/LAD, widely patent SVG to OM with proximal 50% narrowing noted in the graft, total occlusion SVG's to RCA, RI, and the Diagonal/ LV dysfunction with inferobasal aneurysm and mid anterior wall region of akinesis/  overall EF 35-40%/  Total occlusion of the navtive circulation/  no significant change compared to 2006 cath  . CARDIOVASCULAR STRESS TEST  08-01-2011  dr Angelena Form   inferior scar and possible soft tissue attenuation with minimal peri-infarct ischemia, small region of anterior ischemia and scar/  LVEF 53% LV wall motion with inferior hypokinesis/ no significant change from scan july 2011  . CATARACT EXTRACTION W/  INTRAOCULAR LENS  IMPLANT, BILATERAL Bilateral 2007  . CORONARY ARTERY BYPASS GRAFT  11/ [place]-- 6 vessel/  1997 Re-do 5 vessel  . CYSTOSCOPY WITH URETHRAL DILATATION N/A 12/25/2013   Procedure: CYSTOSCOPY WITH URETHRAL DILATATION, WITH BIOPSY;  Surgeon: Bernestine Amass, MD;  [facility];  Service: Urology;  Laterality: N/A;  . CYSTOSCOPY WITH URETHRAL DILATATION N/A 12/22/2014   Procedure: CYSTOSCOPY WITH URETHRAL DILATATION;  Surgeon: Rana Snare, MD;  Location: [facility];  Service: Urology;  Laterality: N/A;  BALLOON DILATION CATHETER    . EUS  10/05/2011   Procedure: ESOPHAGEAL ENDOSCOPIC ULTRASOUND (EUS) RADIAL;  Surgeon: Arta Silence, MD;  Location: [facility];  Service: Endoscopy;  Laterality: N/A;  . INSERTION OF SUPRAPUBIC CATHETER N/A 12/22/2014   Procedure: INSERTION OF SUPRAPUBIC CATHETER ;  Surgeon: Rana Snare, MD;  Location: [facility];  Service: Urology;  Laterality: N/A;  . LAPAROSCOPIC CHOLECYSTECTOMY  12-23-2005  . LEFT HEART CATHETERIZATION WITH CORONARY/GRAFT ANGIOGRAM N/A 08/28/2013   Procedure: LEFT HEART CATHETERIZATION WITH Beatrix Fetters;  Surgeon: Sinclair Grooms, MD;  Location: [facility];  Service: Cardiovascular;  Laterality: N/A;  . MELANOMA EXCISION  X 1   "ear"  . RADIOACTIVE PROSTATE SEED IMPLANTS  1998  . SKIN CANCER EXCISION  X 2   "top of head"  . TONSILLECTOMY AND ADENOIDECTOMY  1954      Social History:     Social History   Tobacco Use  . Smoking status: Former Smoker    Years: 40.00    Types: Cigars    Last attempt to quit: 12/24/1983    Years since quitting: 34.2  . Smokeless tobacco: Former Systems developer    Types: Chew    Quit date: 08/28/2013  .  Tobacco comment: smoked a pipe  Substance Use Topics  . Alcohol use: No         Family History :     Family History  Problem Relation Age of Onset  . Heart disease Mother   . Diabetes Father   . Heart disease Brother        Home  Medications:   Prior to Admission medications   Medication Sig Start Date End Date Taking? Authorizing Provider  acetaminophen (TYLENOL) 500 MG tablet Take 1,000 mg by mouth every 6 (six) hours as needed for pain or fever.    Yes [provider]  apixaban (ELIQUIS) 5 MG TABS tablet Take 1 tablet (5 mg total) by mouth 2 (two) times daily. 05/10/17  Yes Burnell Blanks, MD  atorvastatin (LIPITOR) 80 MG tablet Take 0.5 tablets (40 mg total) by mouth every evening. 05/24/17  Yes Marletta Lor, MD  Calcium Carbonate Antacid (TUMS PO) Take 3 tablets by mouth daily as needed (gas).   Yes [provider]  furosemide (LASIX) 20 MG tablet Take 1 tablet (20 mg total) by mouth daily as needed. For swelling 11/29/17  Yes Burnell Blanks, MD  hydroxypropyl methylcellulose (ISOPTO TEARS) 2.5 % ophthalmic solution Place 1 drop into both eyes 2 (two) times daily.    Yes [provider]  Multiple Vitamin (MULTIVITAMIN WITH MINERALS) TABS Take 1 tablet by mouth daily.   Yes [provider]  nitroGLYCERIN (NITROSTAT) 0.4 MG SL tablet Place 0.4 mg under the tongue every 5 (five) minutes as needed for chest pain.    Yes [provider]  Polyethyl Glycol-Propyl Glycol (SYSTANE) 0.4-0.3 % SOLN Apply 1 drop to eye 3 (three) times daily.   Yes [provider]  ranitidine (ZANTAC) 150 MG tablet Take 1 tablet (150 mg total) by mouth 2 (two) times daily. 05/18/16  Yes Burnell Blanks, MD     Allergies:     Allergies  Allergen Reactions  . Pantoprazole Other (See Comments)    Headache and lightheaded  . Nitrofurantoin Hives  . Sulfa Antibiotics Hives and Itching  . Lidocaine Swelling    Mouth swelling   . Tape Other (See Comments)    TAPE WILL TEAR THE SKIN!!!     Physical Exam:   Vitals  Blood pressure (!) 117/48, pulse 67, temperature 98.3 F (36.8 C), temperature source Oral, resp. rate 16, SpO2 94 %.   1. General Pleasant  frail elderly white male lying in hospital bed in no apparent discomfort,  2. Normal affect and insight, Not Suicidal or Homicidal, Awake Alert, Oriented X 3.  3. No F.N deficits, ALL C.Nerves Intact, Strength 5/5 all 4 extremities, Sensation intact all 4 extremities, Plantars down going.  4. Ears and Eyes appear Normal, Conjunctivae clear, PERRLA. Moist Oral Mucosa.  5. Supple Neck, No JVD, No cervical lymphadenopathy appriciated, No Carotid Bruits.  6. Symmetrical Chest wall movement, Good air movement bilaterally, CTAB.  7. RRR, No Gallops, Rubs or Murmurs, No Parasternal Heave.  8. Positive Bowel Sounds, Abdomen Soft, No tenderness, No organomegaly appriciated,No rebound -guarding or rigidity.  Suprapubic catheter in place with some dark urine in the Foley bag.  9.  No Cyanosis, Normal Skin Turgor, No Skin Rash or Bruise.  10. Good muscle tone,  joints appear normal , no effusions, Normal ROM.  11. No Palpable Lymph Nodes in Neck or Axillae      Data Review:    CBC Recent Labs  Lab 03/05/18  2319  WBC 13.2*  HGB 13.1  HCT 42.2  PLT 161  MCV 91.7  MCH 28.5  MCHC 31.0  RDW 13.6  LYMPHSABS 0.5*  MONOABS 1.1*  EOSABS 0.0  BASOSABS 0.0   ------------------------------------------------------------------------------------------------------------------  Chemistries  Recent Labs  Lab 03/05/18 2319  NA 137  K 4.0  CL 103  CO2 23  GLUCOSE 142*  BUN 26*  CREATININE 1.57*  CALCIUM 9.5  AST 24  ALT 19  ALKPHOS 73  BILITOT 0.9   ------------------------------------------------------------------------------------------------------------------ estimated creatinine clearance is 28.3 mL/min (A) (by C-G formula based on SCr of 1.57 mg/dL (H)). ------------------------------------------------------------------------------------------------------------------ No results for input(s): TSH, T4TOTAL, T3FREE, THYROIDAB in the last 72 hours.  Invalid input(s):  FREET3  Coagulation profile No results for input(s): INR, PROTIME in the last 168 hours. ------------------------------------------------------------------------------------------------------------------- No results for input(s): DDIMER in the last 72 hours. -------------------------------------------------------------------------------------------------------------------  Cardiac Enzymes No results for input(s): CKMB, TROPONINI, MYOGLOBIN in the last 168 hours.  Invalid input(s): CK ------------------------------------------------------------------------------------------------------------------    Component Value Date/Time   BNP 280.6 (H) 12/26/2017 0349     ---------------------------------------------------------------------------------------------------------------  Urinalysis    Component Value Date/Time   COLORURINE YELLOW 03/05/2018 2339   APPEARANCEUR CLOUDY (A) 03/05/2018 2339   LABSPEC 1.020 03/05/2018 2339   PHURINE 6.0 03/05/2018 2339   GLUCOSEU NEGATIVE 03/05/2018 2339   HGBUR LARGE (A) 03/05/2018 2339   BILIRUBINUR NEGATIVE 03/05/2018 2339   BILIRUBINUR neg 06/30/2014 1530   KETONESUR NEGATIVE 03/05/2018 2339   PROTEINUR 100 (A) 03/05/2018 2339   UROBILINOGEN 0.2 12/04/2014 2344   NITRITE POSITIVE (A) 03/05/2018 2339   LEUKOCYTESUR LARGE (A) 03/05/2018 2339    ----------------------------------------------------------------------------------------------------------------   Imaging Results:    Dg Chest 2 View  Result Date: 03/05/2018 CLINICAL DATA:  Fever EXAM: CHEST - 2 VIEW COMPARISON:  12/28/2017 FINDINGS: Post sternotomy changes. No significant pleural effusion. Patchy atelectasis or scarring at the left base. Cardiomegaly. No pneumothorax. Elevation of left diaphragm. IMPRESSION: 1. Patchy atelectasis versus scarring at the left base 2. Cardiomegaly Electronically Signed   By: Donavan Foil M.D.   On: 03/05/2018 23:57       Assessment & Plan:      1.  UTI caused by indwelling suprapubic catheter present on admission.  He will be kept in greatest ER observation, based on past cultures he has been placed on meropenem which will be continued, suprapubic catheter was due to be changed today and we will go ahead and change it.  Will follow culture and sensitivity, gentle IV fluids, clinically looks stable.  Does not look toxic or septic.  2.  Paroxysmal atrial fibrillation.  Mali vas 2 score of at least 3.  Currently appears to be in sinus, continue Eliquis, not on rate controlling agents.  No acute issue.  3.  CAD status post CABG.  No acute issues.  Continue statin for secondary prevention along with Eliquis.  Outpatient cardiology follow-up as desired.  4.  Chronic diastolic CHF last EF 86% in 2018.  Currently appears compensated.  Resume home dose Lasix from tomorrow.  5.  ARF on CKD 3.  Baseline creatinine around 1.25.  Hydrate, hold Lasix and monitor.  6.  Dyslipidemia.  Continue home dose statin.    DVT Prophylaxis Eliquis  AM Labs Ordered, also please review Full Orders  Family Communication: Admission, patients condition and plan of care including tests being ordered have been discussed with the patient who indicates understanding and agree with the plan and Code Status.  Code Status Full  Likely DC to  Home  Condition GUARDED    Consults called: None    Admission status: Obs    Time spent in minutes : 35   Lala Lund M.D on 03/06/2018 at 9:26 AM  To page go to [email or web address] - password [facility]

## 2018-03-06 NOTE — Progress Notes (Signed)
ANTICOAGULATION CONSULT NOTE - Initial Consult  Pharmacy Consult for apixaban Indication: atrial fibrillation  Allergies  Allergen Reactions  . Pantoprazole Other (See Comments)    Headache and lightheaded  . Nitrofurantoin Hives  . Sulfa Antibiotics Hives and Itching  . Lidocaine Swelling    Mouth swelling   . Tape Other (See Comments)    TAPE WILL TEAR THE SKIN!!!    Patient Measurements:    Vital Signs: Temp: 97.6 F (36.4 C) (12/09 2308) Temp Source: Oral (12/09 2308) BP: 127/59 (12/10 0615) Pulse Rate: 90 (12/10 0615)  Labs: Recent Labs    03/05/18 2319  HGB 13.1  HCT 42.2  PLT 161  CREATININE 1.57*    Estimated Creatinine Clearance: 28.3 mL/min (A) (by C-G formula based on SCr of 1.57 mg/dL (H)).   Medical History: Past Medical History:  Diagnosis Date  . Arthritis    "joints ache" (01/02/2014)  . At risk for sleep apnea    STOP-BANG= 4    SENT TO PCP 12-23-2013  . Bladder calculi   . CHF (congestive heart failure) ([place])   . Chronic cystitis   . Coronary artery disease CARDIOLOGIST-  DR Grace Bushy  [place] -- S/P  11/89 CABG x6 (70% circ; 90% PD; 60-70% distal left main; 30% left circ; 90% 1st diag;, 2nd diag and 3rd diag. w/90%,  mild stenosis LAD and 60-70% pLAD)  Re-do CABG x5 in 1997  . Diverticulosis   . GERD (gastroesophageal reflux disease)   . History of Bell's palsy    RIGHT SIDE-- NO RESIDUAL  . Hx of dizziness   . Hyperlipidemia   . Hypertension    "not anymore" (01/02/2014)  . Ischemic cardiomyopathy    ef 35-40% per cath 08-28-2013  . Kidney stones "years ago"   "passed them"  . Melanoma of ear ([place])    "right"  . Mild dementia ([place])   . Myocardial infarction ([place]) 1986; 1997  . Nocturia   . OAB (overactive bladder)   . Prostate cancer ([place]) 1998   S/P  [place]; [place]  . S/P CABG (coronary artery bypass graft)    [place]  . Skipped heart beats    occasional  . Urethral stricture    . UTI (urinary tract infection) 09/2015  . Wears hearing aid    bilateral  . Wears partial dentures    Assessment: 89 yom on chronic apixban for history of afib. To continue apixaban in hospital. Scr is elevated so dose will require adjustment for now. CBC is ok and no bleeding noted.   Goal of Therapy:  Stroke prevention Monitor platelets by anticoagulation protocol: Yes   Plan:  Apixaban 2.5mg  PO BID F/u renal fxn, S&S of bleeding  Derrik Mceachern, Rande Lawman 03/06/2018,7:29 AM

## 2018-03-06 NOTE — Progress Notes (Signed)
Per Dr. Candiss Norse if patient passes swallow screen than he can eat

## 2018-03-06 NOTE — ED Notes (Signed)
bfast tray ordered 

## 2018-03-06 NOTE — Progress Notes (Signed)
Pharmacy Antibiotic Note  Ronald [name] is a 84 y.o. male admitted on 03/05/2018 with fever. Pharmacy has been consulted for meropenem dosing. Pt is currently afebrile but WBC is elevated. Scr is also elevated above baseline. Meropenem already started this AM.   Plan: Continue meropenem 1gm IV Q12H F/u renal fxn, C&S, clinical status      Temp (24hrs), Avg:97.6 F (36.4 C), Min:97.6 F (36.4 C), Max:97.6 F (36.4 C)  Recent Labs  Lab 03/05/18 2319 03/06/18 0014  WBC 13.2*  --   CREATININE 1.57*  --   LATICACIDVEN  --  1.46    Estimated Creatinine Clearance: 28.3 mL/min (A) (by C-G formula based on SCr of 1.57 mg/dL (H)).    Allergies  Allergen Reactions  . Pantoprazole Other (See Comments)    Headache and lightheaded  . Nitrofurantoin Hives  . Sulfa Antibiotics Hives and Itching  . Lidocaine Swelling    Mouth swelling   . Tape Other (See Comments)    TAPE WILL TEAR THE SKIN!!!    Antimicrobials this admission: Meropenem 12/10>>  Dose adjustments this admission: N/A  Microbiology results: Pending  Thank you for allowing pharmacy to be a part of this patient's care.  Oktober Glazer, Rande Lawman 03/06/2018 7:27 AM

## 2018-03-07 DIAGNOSIS — I255 Ischemic cardiomyopathy: Secondary | ICD-10-CM | POA: Diagnosis present

## 2018-03-07 DIAGNOSIS — T83510A Infection and inflammatory reaction due to cystostomy catheter, initial encounter: Secondary | ICD-10-CM | POA: Diagnosis not present

## 2018-03-07 DIAGNOSIS — Z87891 Personal history of nicotine dependence: Secondary | ICD-10-CM | POA: Diagnosis not present

## 2018-03-07 DIAGNOSIS — A419 Sepsis, unspecified organism: Secondary | ICD-10-CM | POA: Diagnosis present

## 2018-03-07 DIAGNOSIS — T83518A Infection and inflammatory reaction due to other urinary catheter, initial encounter: Secondary | ICD-10-CM | POA: Diagnosis present

## 2018-03-07 DIAGNOSIS — Z87440 Personal history of urinary (tract) infections: Secondary | ICD-10-CM | POA: Diagnosis not present

## 2018-03-07 DIAGNOSIS — I2510 Atherosclerotic heart disease of native coronary artery without angina pectoris: Secondary | ICD-10-CM | POA: Diagnosis present

## 2018-03-07 DIAGNOSIS — Z8546 Personal history of malignant neoplasm of prostate: Secondary | ICD-10-CM

## 2018-03-07 DIAGNOSIS — B9561 Methicillin susceptible Staphylococcus aureus infection as the cause of diseases classified elsewhere: Secondary | ICD-10-CM | POA: Diagnosis present

## 2018-03-07 DIAGNOSIS — I480 Paroxysmal atrial fibrillation: Secondary | ICD-10-CM | POA: Diagnosis not present

## 2018-03-07 DIAGNOSIS — I214 Non-ST elevation (NSTEMI) myocardial infarction: Secondary | ICD-10-CM | POA: Diagnosis not present

## 2018-03-07 DIAGNOSIS — N183 Chronic kidney disease, stage 3 (moderate): Secondary | ICD-10-CM

## 2018-03-07 DIAGNOSIS — I130 Hypertensive heart and chronic kidney disease with heart failure and stage 1 through stage 4 chronic kidney disease, or unspecified chronic kidney disease: Secondary | ICD-10-CM | POA: Diagnosis present

## 2018-03-07 DIAGNOSIS — B961 Klebsiella pneumoniae [K. pneumoniae] as the cause of diseases classified elsewhere: Secondary | ICD-10-CM | POA: Diagnosis present

## 2018-03-07 DIAGNOSIS — N179 Acute kidney failure, unspecified: Secondary | ICD-10-CM | POA: Diagnosis not present

## 2018-03-07 DIAGNOSIS — I5032 Chronic diastolic (congestive) heart failure: Secondary | ICD-10-CM | POA: Diagnosis not present

## 2018-03-07 DIAGNOSIS — Z87442 Personal history of urinary calculi: Secondary | ICD-10-CM | POA: Diagnosis not present

## 2018-03-07 DIAGNOSIS — N390 Urinary tract infection, site not specified: Secondary | ICD-10-CM | POA: Diagnosis not present

## 2018-03-07 DIAGNOSIS — R509 Fever, unspecified: Secondary | ICD-10-CM | POA: Diagnosis not present

## 2018-03-07 DIAGNOSIS — Z85820 Personal history of malignant melanoma of skin: Secondary | ICD-10-CM | POA: Diagnosis not present

## 2018-03-07 DIAGNOSIS — I25810 Atherosclerosis of coronary artery bypass graft(s) without angina pectoris: Secondary | ICD-10-CM | POA: Diagnosis not present

## 2018-03-07 DIAGNOSIS — Z951 Presence of aortocoronary bypass graft: Secondary | ICD-10-CM | POA: Diagnosis not present

## 2018-03-07 DIAGNOSIS — K219 Gastro-esophageal reflux disease without esophagitis: Secondary | ICD-10-CM | POA: Diagnosis present

## 2018-03-07 DIAGNOSIS — I252 Old myocardial infarction: Secondary | ICD-10-CM | POA: Diagnosis not present

## 2018-03-07 DIAGNOSIS — Y846 Urinary catheterization as the cause of abnormal reaction of the patient, or of later complication, without mention of misadventure at the time of the procedure: Secondary | ICD-10-CM | POA: Diagnosis present

## 2018-03-07 DIAGNOSIS — Z79899 Other long term (current) drug therapy: Secondary | ICD-10-CM | POA: Diagnosis not present

## 2018-03-07 DIAGNOSIS — Z7901 Long term (current) use of anticoagulants: Secondary | ICD-10-CM | POA: Diagnosis not present

## 2018-03-07 DIAGNOSIS — F0390 Unspecified dementia without behavioral disturbance: Secondary | ICD-10-CM | POA: Diagnosis present

## 2018-03-07 DIAGNOSIS — E785 Hyperlipidemia, unspecified: Secondary | ICD-10-CM | POA: Diagnosis present

## 2018-03-07 LAB — BASIC METABOLIC PANEL
Anion gap: 8 (ref 5–15)
BUN: 24 mg/dL — ABNORMAL HIGH (ref 8–23)
CO2: 24 mmol/L (ref 22–32)
Calcium: 8.8 mg/dL — ABNORMAL LOW (ref 8.9–10.3)
Chloride: 108 mmol/L (ref 98–111)
Creatinine, Ser: 1.28 mg/dL — ABNORMAL HIGH (ref 0.61–1.24)
GFR calc Af Amer: 58 mL/min — ABNORMAL LOW (ref >60)
GFR calc non Af Amer: 50 mL/min — ABNORMAL LOW (ref >60)
Glucose, Bld: 108 mg/dL — ABNORMAL HIGH (ref 70–99)
Potassium: 3.9 mmol/L (ref 3.5–5.1)
Sodium: 140 mmol/L (ref 135–145)

## 2018-03-07 MED ORDER — APIXABAN 5 MG PO TABS
5.0000 mg | ORAL_TABLET | Freq: Two times a day (BID) | ORAL | Status: DC
  Administered 2018-03-07 – 2018-03-08 (×2): 5 mg via ORAL
  Filled 2018-03-07 (×2): qty 1

## 2018-03-07 NOTE — Progress Notes (Signed)
Physical Therapy Treatment Patient Details Name: Ronald [name] MRN: [id number] DOB: [birth year] Today's Date: 03/07/2018    History of Present Illness Pt is a 84 y.o. male admitted 03/05/18 with fever and chills, UTI. PMH includes HTN, HLD, CAD, CABG, CHF, dementia, CKD-3, prostate cancer, PAF on Eliquis.    PT Comments    Continuing work on functional mobility and activity tolerance;  Noting very nice improvements in bed mobiltiy, transfers, and amb; Able to incr progressive amb distance well; Dc home remains appropriate, and recommend HHPT follow up   Follow Up Recommendations  Home health PT     Equipment Recommendations  None recommended by PT    Recommendations for Other Services       Precautions / Restrictions Precautions Precautions: Fall Precaution Comments: incontinent of stool, suprapubic catheter    Mobility  Bed Mobility Overal bed mobility: Needs Assistance Bed Mobility: Supine to Sit     Supine to sit: Min guard     General bed mobility comments: Incr time, but did not require physical assist  Transfers Overall transfer level: Needs assistance Equipment used: Rolling walker (2 wheeled) Transfers: Sit to/from Stand Sit to Stand: Min guard         General transfer comment: Cues for hand placement and safety; good rise  Ambulation/Gait Ambulation/Gait assistance: Min guard(without physical contact) Gait Distance (Feet): 200 Feet Assistive device: Rolling walker (2 wheeled) Gait Pattern/deviations: Step-through pattern;Decreased stride length;Trunk flexed     General Gait Details: cues to increase proximity to Rw and extend trunk   Stairs             Wheelchair Mobility    Modified Rankin (Stroke Patients Only)       Balance     Sitting balance-Leahy Scale: Fair Sitting balance - Comments: Good improvements noted                                    Cognition Arousal/Alertness: Awake/alert Behavior During  Therapy: WFL for tasks assessed/performed Overall Cognitive Status: Within Functional Limits for tasks assessed(for simple mobility)                                        Exercises      General Comments        Pertinent Vitals/Pain Pain Assessment: Faces Faces Pain Scale: Hurts a little bit Pain Location: generalized discomfort; pt attributes this to being in bed a lot Pain Descriptors / Indicators: Discomfort Pain Intervention(s): Monitored during session;Repositioned    Home Living                      Prior Function            PT Goals (current goals can now be found in the care plan section) Acute Rehab PT Goals Patient Stated Goal: return home PT Goal Formulation: With patient/family Time For Goal Achievement: 03/20/18 Potential to Achieve Goals: Good Progress towards PT goals: Progressing toward goals    Frequency    Min 3X/week      PT Plan Current plan remains appropriate    Co-evaluation              AM-PAC PT "6 Clicks" Mobility   Outcome Measure  Help needed turning from your back to your side while in a flat  bed without using bedrails?: None Help needed moving from lying on your back to sitting on the side of a flat bed without using bedrails?: A Little Help needed moving to and from a bed to a chair (including a wheelchair)?: A Little Help needed standing up from a chair using your arms (e.g., wheelchair or bedside chair)?: A Little Help needed to walk in hospital room?: A Little Help needed climbing 3-5 steps with a railing? : A Little 6 Click Score: 19    End of Session Equipment Utilized During Treatment: Gait belt Activity Tolerance: Patient tolerated treatment well Patient left: in chair;with call bell/phone within reach;with chair alarm set Nurse Communication: Mobility status PT Visit Diagnosis: Other abnormalities of gait and mobility (R26.89);Muscle weakness (generalized) (M62.81)     Time:  4315-4008 PT Time Calculation (min) (ACUTE ONLY): 21 min  Charges:  $Gait Training: 8-22 mins                     Roney [place], Industry Pager [phone] Office [place] 03/07/2018, 8:55 AM

## 2018-03-07 NOTE — Care Management Note (Addendum)
Case Management Note  Patient Details  Name: Ronald [name] MRN: [id number] Date of Birth: October 01, 1927  Subjective/Objective:  84yo male presented with fever and chills.                 Action/Plan: CM met with patient/spouse to discuss dispositional needs. Patient reports living at home with spouse, independent with ADLs with some assistance provided by spouse d/t chronic foley; occasionally ambulates with a SPC. PCP verified as: Dr. Lelon Frohlich; [facility] CM spoke to [place], [place], [place] and provided an update on the patients POC; please fax progress notes/DC summary to [phone] ATTN: Dr. Ishmael Holter. CM discussed [facility], with patient/spouse agreeable with referral entered in Epic. PT recommended HHPT, with patient/spouse agreeable to [facility] services and indicated patient was recently opened with [facility]. CMS HH compare list provided, with patient/spouse requesting [facility] for recommended services. Patient will need orders for HHRN/PT and F2F. [place] referral given to [facility] liaison; AVS updated. Spouse will provide transportation home. CM team will continue to follow.   Expected Discharge Date:                  Expected Discharge Plan:  [place]  In-House Referral:  NA, [facility]  Discharge planning Services  CM Consult  Post Acute Care Choice:  Home Health Choice offered to:  Patient, Spouse  DME Arranged:  N/A DME Agency:  NA  HH Arranged:  RN, Disease Management, PT [place] Agency:  [place]  Status of Service:  In process, will continue to follow  If discussed at Long Length of Stay Meetings, dates discussed:    Additional Comments:  Midge Minium RN, BSN, NCM-BC, ACM-RN [phone] 03/07/2018, 11:24 AM

## 2018-03-07 NOTE — Progress Notes (Addendum)
PROGRESS NOTE  Ronald [name]:[id number] DOB: 1927/08/11 DOA: 03/05/2018 PCP: Isaac Bliss, [name], MD   LOS: 0 days   Brief narrative:  Ronald [name]  is a 84 y.o. male with history of prostate cancer with indwelling suprapubic catheter, prostate UTIs, paroxysmal H fibrillation, CAD CABG, chronic diastolic congestive heart failure with last ejection fraction of 55%, dementia, GERD, hypertension, presented to the hospital with fever chills abdominal pain.  Suprapubic Foley catheter was changed in the ED.  Patient  was admitted to hospital for CAUTI with possible sepsis.  Blood culture was positive for Klebsiella pneumonia 1 bottle.  Urine culture was positive for Klebsiella pneumonia as well.  Assessment/Plan:  Principal Problem:   Urinary tract infection associated with cystostomy catheter ([place]) Active Problems:   PROSTATE CANCER, HX OF   NSTEMI (non-ST elevated myocardial infarction) ([place])   Coronary artery disease involving coronary bypass graft of native heart without angina pectoris   CKD (chronic kidney disease), stage III (HCC)   Chronic diastolic CHF (congestive heart failure) (HCC)   PAF (paroxysmal atrial fibrillation) (HCC)  Urinary tract infection secondary to suprapubic catheter with sepsis.  Patient did have some signs of sepsis on presentation and he is on Rocephin 2gm iv daily.  Blood culture 1 bottle positive for Klebsiella.  Urine culture is positive for Klebsiella, pending culture.  Trend fever curve, leukocytosis.  T-max of 99 F.  History of prostate cancer status post suprapubic catheter.  Continue supportive care.  History of coronary artery disease status post CABG- continue on Eliquis, ativan. No acute issues  Paroxysmal atrial fibrillation.  On Eliquis.  Not on any rate controlling medications.  Acute kidney injury on chronic kidney disease stage III.  Patient creatinine of around 1.25.   Continue to hold Lasix.  midly improved renal function. Check  BMP in am.  VTE Prophylaxis: Apixaban  Code Status:  Full code  Family Communication: No one available at bedside  Disposition Plan: Home, 1-2 days.  Follow blood and urine cultures and sensitivity.  Follow temperature curve.  Will change patient to inpatient at this time.   Consultants:  None  Procedures:  None  Antibiotics:  Rocephin > 03/06/18  Subjective: Patient denies cough, shortness of breath, chills or rigors.  Denies abdominal pain.  Feels better after suprapubic catheter replacement.  Objective: Vitals:   03/07/18 0638 03/07/18 1204  BP: (!) 118/50 (!) 122/55  Pulse: (!) 52 (!) 51  Resp: 16 17  Temp: 98.7 F (37.1 C) (!) 97.5 F (36.4 C)  SpO2: 94% 96%    Intake/Output Summary (Last 24 hours) at 03/07/2018 1625 Last data filed at 03/07/2018 8119 Gross per 24 hour  Intake 1066.63 ml  Output 2000 ml  Net -933.37 ml   Filed Weights   03/06/18 1129  Weight: 71.6 kg   Physical Examination: General exam: Appears calm and comfortable ,Not in distress, elderly frail gentleman HEENT:PERRL,Oral mucosa moist Respiratory system: Bilateral equal air entry, normal vesicular breath sounds, no wheezes or crackles  Cardiovascular system: S1 & S2 heard, RRR.  Gastrointestinal system: Abdomen is nondistended, soft and nontender. No organomegaly or masses felt. Normal bowel sounds heard.  Suprapubic catheter. Central nervous system: Alert and oriented. No focal neurological deficits. Extremities: No edema, no clubbing ,no cyanosis, distal peripheral pulses palpable. Skin: No rashes, lesions or ulcers,no icterus ,no pallor MSK: Normal muscle bulk,tone ,power   Data Review: I have personally reviewed the following laboratory data and studies,  CBC: Recent Labs  Lab  03/05/18 2319  WBC 13.2*  NEUTROABS 11.5*  HGB 13.1  HCT 42.2  MCV 91.7  PLT 409   Basic Metabolic Panel: Recent Labs  Lab 03/05/18 2319 03/07/18 0444  NA 137 140  K 4.0 3.9  CL 103 108    CO2 23 24  GLUCOSE 142* 108*  BUN 26* 24*  CREATININE 1.57* 1.28*  CALCIUM 9.5 8.8*   Liver Function Tests: Recent Labs  Lab 03/05/18 2319  AST 24  ALT 19  ALKPHOS 73  BILITOT 0.9  PROT 6.6  ALBUMIN 3.8   No results for input(s): LIPASE, AMYLASE in the last 168 hours. No results for input(s): AMMONIA in the last 168 hours. Cardiac Enzymes: No results for input(s): CKTOTAL, CKMB, CKMBINDEX, TROPONINI in the last 168 hours. BNP (last 3 results) Recent Labs    12/26/17 0349  BNP 280.6*    ProBNP (last 3 results) No results for input(s): PROBNP in the last 8760 hours.  CBG: No results for input(s): GLUCAP in the last 168 hours. Recent Results (from the past 240 hour(s))  Blood culture (routine x 2)     Status: None (Preliminary result)   Collection Time: 03/05/18 11:19 PM  Result Value Ref Range Status   Specimen Description BLOOD RIGHT HAND  Final   Special Requests   Final    BOTTLES DRAWN AEROBIC AND ANAEROBIC Blood Culture adequate volume   Culture   Final    NO GROWTH 1 DAY Performed at [facility], [address], [place], [place] [address]    Report Status PENDING  Incomplete  Urine culture     Status: Abnormal (Preliminary result)   Collection Time: 03/05/18 11:39 PM  Result Value Ref Range Status   Specimen Description URINE, RANDOM  Final   Special Requests   Final    NONE Performed at [facility], [place] [address], [place], [place] [address]    Culture >=100,000 COLONIES/mL KLEBSIELLA PNEUMONIAE (A)  Final   Report Status PENDING  Incomplete  Blood culture (routine x 2)     Status: Abnormal (Preliminary result)   Collection Time: 03/06/18 12:01 AM  Result Value Ref Range Status   Specimen Description BLOOD LEFT ANTECUBITAL  Final   Special Requests   Final    BOTTLES DRAWN AEROBIC AND ANAEROBIC Blood Culture adequate volume   Culture  Setup Time   Final    GRAM NEGATIVE RODS ANAEROBIC BOTTLE ONLY CRITICAL RESULT CALLED TO, READ BACK  BY AND VERIFIED WITH: M. PHAM, PHARMD AT 1420 ON 03/06/18 BY C. JESSUP, MLT.    Culture (A)  Final    KLEBSIELLA PNEUMONIAE SUSCEPTIBILITIES TO FOLLOW Performed at [facility], [place] [address], [place], [place] [address]    Report Status PENDING  Incomplete  Blood Culture ID Panel (Reflexed)     Status: Abnormal   Collection Time: 03/06/18 12:01 AM  Result Value Ref Range Status   Enterococcus species NOT DETECTED NOT DETECTED Final   Listeria monocytogenes NOT DETECTED NOT DETECTED Final   Staphylococcus species NOT DETECTED NOT DETECTED Final   Staphylococcus aureus (BCID) NOT DETECTED NOT DETECTED Final   Streptococcus species NOT DETECTED NOT DETECTED Final   Streptococcus agalactiae NOT DETECTED NOT DETECTED Final   Streptococcus pneumoniae NOT DETECTED NOT DETECTED Final   Streptococcus pyogenes NOT DETECTED NOT DETECTED Final   Acinetobacter baumannii NOT DETECTED NOT DETECTED Final   Enterobacteriaceae species DETECTED (A) NOT DETECTED Final    Comment: Enterobacteriaceae represent  a large family of gram-negative bacteria, not a single organism. CRITICAL RESULT CALLED TO, READ BACK BY AND VERIFIED WITH: M. PHAM, PHARMD AT 1420 ON 03/06/18 BY C. JESSUP, MLT.    Enterobacter cloacae complex NOT DETECTED NOT DETECTED Final   Escherichia coli NOT DETECTED NOT DETECTED Final   Klebsiella oxytoca NOT DETECTED NOT DETECTED Final   Klebsiella pneumoniae DETECTED (A) NOT DETECTED Final    Comment: CRITICAL RESULT CALLED TO, READ BACK BY AND VERIFIED WITH: M. PHAM, PHARMD AT 1420 ON 03/06/18 BY C. JESSUP, MLT.    Proteus species NOT DETECTED NOT DETECTED Final   Serratia marcescens NOT DETECTED NOT DETECTED Final   Carbapenem resistance NOT DETECTED NOT DETECTED Final   Haemophilus influenzae NOT DETECTED NOT DETECTED Final   Neisseria meningitidis NOT DETECTED NOT DETECTED Final   Pseudomonas aeruginosa NOT DETECTED NOT DETECTED Final   Candida albicans NOT DETECTED NOT  DETECTED Final   Candida glabrata NOT DETECTED NOT DETECTED Final   Candida krusei NOT DETECTED NOT DETECTED Final   Candida parapsilosis NOT DETECTED NOT DETECTED Final   Candida tropicalis NOT DETECTED NOT DETECTED Final    Comment: Performed at [facility], [place] [address] [place], [place] [address]     Studies: Dg Chest 2 View  Result Date: 03/05/2018 CLINICAL DATA:  Fever EXAM: CHEST - 2 VIEW COMPARISON:  12/28/2017 FINDINGS: Post sternotomy changes. No significant pleural effusion. Patchy atelectasis or scarring at the left base. Cardiomegaly. No pneumothorax. Elevation of left diaphragm. IMPRESSION: 1. Patchy atelectasis versus scarring at the left base 2. Cardiomegaly Electronically Signed   By: Donavan Foil M.D.   On: 03/05/2018 23:57    Scheduled Meds: . apixaban  5 mg Oral BID  . atorvastatin  40 mg Oral QPM  . famotidine  20 mg Oral Daily  . multivitamin with minerals  1 tablet Oral Daily  . polyvinyl alcohol  1 drop Both Eyes BID  . sodium chloride flush  3 mL Intravenous Q12H   Continuous Infusions: . cefTRIAXone (ROCEPHIN)  IV 2 g (03/07/18 1040)   Valmai Vandenberghe  [facility] Pager [phone]  If 7PM-7AM, please contact night-coverage at [email or web address], password [facility] 03/07/2018, 4:25 PM

## 2018-03-07 NOTE — Discharge Instructions (Addendum)
Bacteremia Bacteremia is the presence of bacteria in the blood. When bacteria enter the bloodstream, they can cause a life-threatening reaction called sepsis, which is a medical emergency. Bacteremia can spread to other parts of the body, including the heart, joints, and brain. What are the causes? This condition is caused by bacteria that get into the blood. Bacteria can enter the blood:  From a skin infection or a cut on your skin.  During an episode of pneumonia.  From an infection in your stomach or intestine (gastrointestinal infection).  From an infection in your bladder or urinary system (urinary tract infection).  During a dental or medical procedure.  After you brush your teeth so hard that your gums bleed.  When a bacterial infection in another part of the body spreads to the blood.  Through a dirty needle.  What increases the risk? This condition is more likely to develop in:  Children.  Elderly adults.  People who have a long-lasting (chronic) disease or medical condition.  People who have an artificial joint or heart valve.  People who have heart valve disease.  People who have a tube, such as a catheter or IV tube, that has been inserted for a medical treatment.  People who have a weak body defense system (immune system).  People who use IV drugs.  What are the signs or symptoms? Symptoms of this condition include:  Fever.  Chills.  A racing heart.  Shortness of breath.  Dizziness.  Weakness.  Confusion.  Nausea or vomiting.  Diarrhea.  In some cases, there are no symptoms. Bacteremia that has spread to the other parts of the body may cause symptoms in those areas. How is this diagnosed? This condition may be diagnosed with a physical exam and tests, such as:  A complete blood count (CBC). This test looks for signs of infection.  Blood cultures. These look for bacteria in your blood.  Tests of any tubes that you may have inserted  into your body, such as an IV tube or urinary catheter. These tests look for a source of infection.  Urine tests including urine cultures. These look for bacteria in the urine that could be a source of infection.  Imaging tests, such as an X-ray, CT scan, MRI, or heart ultrasound. These look for a source of infection in other parts of the body, such as the lungs, heart valves, or joints.  How is this treated? This condition may be treated with:  Antibiotic medicines given through an IV infusion. Depending on the source of infection, antibiotics may be needed for several weeks. At first, an antibiotic may be given to kill most types of blood bacteria. If your test results show that a certain kind of bacteria is causing problems, the antibiotic may be changed to kill only the bacteria that are causing problems.  Antibiotics taken by mouth.  IV fluids to support the body as you fight the infection.  Removing any catheter or device that could be a source of infection.  Blood pressure and breathing support, if you have sepsis.  Surgery to control the source or spread of infection, such as: ? Removing an infected implanted device. ? Removing infected tissue or an abscess.  This condition is usually treated at a hospital. If you are treated at home, you may need to come back for medicines, blood tests, and evaluation. This is important. Follow these instructions at home:  Take over-the-counter and prescription medicines only as told by your health care provider.  If you were prescribed an antibiotic, take it as told by your health care provider. Do not stop taking the antibiotic even if you start to feel better.  Rest until your condition is under control.  Drink enough fluid to keep your urine clear or pale yellow.  Do not smoke. If you need help quitting, ask your health care provider.  Keep all follow-up visits as told by your health care provider. This is important. How is this  prevented?  Get the vaccinations that your health care provider recommends.  Clean and cover any scrapes or cuts.  Take good care of your skin. This includes regular bathing and moisturizing.  Wash your hands often.  Practice good oral hygiene. Brush your teeth two times a day and floss regularly. Get help right away if:  You have pain.  You have a fever.  You have trouble breathing.  Your skin becomes blotchy, pale, or clammy.  You develop confusion, dizziness, or weakness.  You develop diarrhea.  You develop any new symptoms after treatment. Summary  Bacteremia is the presence of bacteria in the blood. When bacteria enter the bloodstream, they can cause a life- threatening reaction called sepsis.  Children and elderly adults are at increased risk of bacteremia. Other risk factors include having a long-lasting (chronic) disease or a weak immune system, having an artificial joint or heart valve, having heart valve disease, having tubes that were inserted in the body for medical treatment, or using IV drugs.  Some symptoms of bacteremia include fever, chills, shortness of breath, confusion, nausea or vomiting, and diarrhea.  Tests may be done to diagnose a source of infection that led to bacteremia. These tests may include blood tests, urine tests, and imaging tests.  Bacteremia is usually treated with antibiotics, usually in a hospital. This information is not intended to replace advice given to you by your health care provider. Make sure you discuss any questions you have with your health care provider. Document Released: 12/26/2005 Document Revised: 02/09/2016 Document Reviewed: 02/09/2016 Elsevier Interactive Patient Education  2018 [place] on my medicine - ELIQUIS (apixaban)  This medication education was reviewed with me or my healthcare representative as part of my discharge preparation.   Why was Eliquis prescribed for you? Eliquis was  prescribed for you to reduce the risk of forming blood clots that can cause a stroke if you have a medical condition called atrial fibrillation (a type of irregular heartbeat) OR to reduce the risk of a blood clots forming after orthopedic surgery.  What do You need to know about Eliquis ? Take your Eliquis TWICE DAILY - one tablet in the morning and one tablet in the evening with or without food.  It would be best to take the doses about the same time each day.  If you have difficulty swallowing the tablet whole please discuss with your pharmacist how to take the medication safely.  Take Eliquis exactly as prescribed by your doctor and DO NOT stop taking Eliquis without talking to the doctor who prescribed the medication.  Stopping may increase your risk of developing a new clot or stroke.  Refill your prescription before you run out.  After discharge, you should have regular check-up appointments with your healthcare provider that is prescribing your Eliquis.  In the future your dose may need to be changed if your kidney function or weight changes by a significant amount or as you get older.  What do you do if you miss a dose?  If you miss a dose, take it as soon as you remember on the same day and resume taking twice daily.  Do not take more than one dose of ELIQUIS at the same time.  Important Safety Information A possible side effect of Eliquis is bleeding. You should call your healthcare provider right away if you experience any of the following: ? Bleeding from an injury or your nose that does not stop. ? Unusual colored urine (red or dark brown) or unusual colored stools (red or black). ? Unusual bruising for unknown reasons. ? A serious fall or if you hit your head (even if there is no bleeding).  Some medicines may interact with Eliquis and might increase your risk of bleeding or clotting while on Eliquis. To help avoid this, consult your healthcare provider or pharmacist prior  to using any new prescription or non-prescription medications, including herbals, vitamins, non-steroidal anti-inflammatory drugs (NSAIDs) and supplements.  This website has more information on Eliquis (apixaban): [email or web address].no.

## 2018-03-07 NOTE — Consult Note (Signed)
   [facility] CM Inpatient Consult   03/07/2018  Ronald [name] 06/26/1927 [id number]  Referral received for [facility] Care Management for community follow up.  Patient has had a HX with [facility] RNCM in the past.  A consent is on file. Met with the patient and wife regarding [facility] Care Management services.  Patient states he is going to have home health and therapy and doesn't see how all of this is "helping me avoid these infections."  Patient spoke a lenght regarding various home health scenarios.  Wife also at bedside.  She states she will accept the brochure and 24 hour nurse advise line.  Encouraged her to call at any time for follow up.  Patient is currently declining any needs for [facility] Care Management.  He is active with [facility] as well as his primary care provider, Dr. Estela Hernandez Acosto.  This office provides the transition of care follow up calls and appointments.  Patient declines at this time.  Encouraged them to contact for needs.   , RN BSN CCM [facility] Liaison  [phone] business mobile phone Toll free office [phone]    

## 2018-03-08 DIAGNOSIS — N179 Acute kidney failure, unspecified: Secondary | ICD-10-CM

## 2018-03-08 LAB — BASIC METABOLIC PANEL
Anion gap: 9 (ref 5–15)
BUN: 22 mg/dL (ref 8–23)
CO2: 26 mmol/L (ref 22–32)
Calcium: 8.9 mg/dL (ref 8.9–10.3)
Chloride: 106 mmol/L (ref 98–111)
Creatinine, Ser: 1.27 mg/dL — ABNORMAL HIGH (ref 0.61–1.24)
GFR calc Af Amer: 58 mL/min — ABNORMAL LOW (ref >60)
GFR calc non Af Amer: 50 mL/min — ABNORMAL LOW (ref >60)
Glucose, Bld: 111 mg/dL — ABNORMAL HIGH (ref 70–99)
Potassium: 4.7 mmol/L (ref 3.5–5.1)
Sodium: 141 mmol/L (ref 135–145)

## 2018-03-08 LAB — CULTURE, BLOOD (ROUTINE X 2): Special Requests: ADEQUATE

## 2018-03-08 LAB — CBC
HCT: 38.8 % — ABNORMAL LOW (ref 39.0–52.0)
Hemoglobin: 12 g/dL — ABNORMAL LOW (ref 13.0–17.0)
MCH: 28.4 pg (ref 26.0–34.0)
MCHC: 30.9 g/dL (ref 30.0–36.0)
MCV: 91.7 fL (ref 80.0–100.0)
Platelets: 139 10*3/uL — ABNORMAL LOW (ref 150–400)
RBC: 4.23 MIL/uL (ref 4.22–5.81)
RDW: 13.7 % (ref 11.5–15.5)
WBC: 7 10*3/uL (ref 4.0–10.5)
nRBC: 0 % (ref 0.0–0.2)

## 2018-03-08 MED ORDER — CEFUROXIME AXETIL 500 MG PO TABS: 500.0000 mg | ORAL_TABLET | Freq: Two times a day (BID) | ORAL | 0 refills | Status: AC

## 2018-03-08 NOTE — Discharge Summary (Signed)
Physician Discharge Summary  Ronald [name]:[id number] DOB: 26-Jul-1927 DOA: 03/05/2018  PCP: Isaac Bliss, [name], MD  Admit date: 03/05/2018 Discharge date: 03/10/2018  Admitted From: home  Disposition:  home   Recommendations for Outpatient Follow-up:  F/u Bmet at next visit  Home Health:  PT ordered       Discharge Condition:  stable   CODE STATUS:  Full code   Consultations:  none    Discharge Diagnoses:  Principal Problem:   Urinary tract infection associated with cystostomy catheter ([place]) Active Problems:   PROSTATE CANCER, HX OF   NSTEMI (non-ST elevated myocardial infarction) ([place])   Coronary artery disease involving coronary bypass graft of native heart without angina pectoris   CKD (chronic kidney disease), stage III (HCC)   Chronic diastolic CHF (congestive heart failure) (HCC)   PAF (paroxysmal atrial fibrillation) ([place])     Brief Summary: [name] y.o.male with history of prostate cancer with indwelling suprapubic catheter, prostate UTIs, paroxysmal H fibrillation, CAD CABG, chronic diastolic congestive heart failure with last ejection fraction of 55%, dementia, GERD, hypertension, presented to the hospital with fever, chills abdominal pain. Temp was 100.7 at home.  Suprapubic Foley catheter was changed in the ED.  Patient  was admitted to hospital for CAUTI with possible sepsis.  Blood culture was positive for Klebsiella pneumonia 1 bottle.  Urine culture was positive for Klebsiella pneumonia as well.   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 - PRN Lasix   Discharge Exam: Vitals:   03/08/18 0523 03/08/18 0906  BP: (!) 115/43 108/63  Pulse: (!) 47 (!) 46  Resp: 20 18  Temp: 98.1 F (36.7 C) 97.7 F (36.5 C)  SpO2: 96% 99%   Vitals:   03/07/18 1731 03/07/18 2211 03/08/18 0523 03/08/18 0906  BP: (!) 139/50 (!) 147/47 (!) 115/43 108/63  Pulse: (!) 56 (!) 53 (!) 47 (!) 46  Resp: 20 16 20 18   Temp: 98.1 F (36.7 C) 98.6 F (37 C) 98.1 F (36.7 C) 97.7 F (36.5 C)  TempSrc: Oral Oral Oral Oral  SpO2: 97% 100% 96% 99%  Weight:  70.5 kg    Height:  5\' 5"  (1.651 m)      General: Pt is alert, awake, not in acute distress Cardiovascular: RRR, S1/S2 +, no rubs, no gallops Respiratory: CTA bilaterally, no wheezing, no rhonchi Abdominal: Soft, NT, ND, bowel sounds + Extremities: no edema, no cyanosis   Discharge Instructions  Discharge Instructions    Diet - low sodium heart healthy   Complete by:  As directed    Face-to-face encounter (required for Medicare/Medicaid patients)   Complete by:  As directed    The encounter with the patient was in whole, or in part, for the  following medical condition, which is the primary reason for home health care:  UTI   I certify that, based on my findings, the following services are medically necessary home health services:  Physical therapy   Reason for Medically Necessary Home Health Services:  Therapy- Therapeutic Exercises to Increase Strength and Endurance   My clinical findings support the need for the above services:  Unable to leave home safely without assistance and/or assistive device   Further, I certify that my clinical findings support that this patient is homebound due to:  Unable to leave home safely without assistance    Home Health   Complete by:  As directed    To provide the following care/treatments:  PT   Increase activity slowly   Complete by:  As directed      Allergies as of 03/08/2018      Reactions   Pantoprazole Other (See Comments)   Headache and lightheaded   Nitrofurantoin Hives   Sulfa Antibiotics Hives, Itching   Lidocaine Swelling   Mouth swelling   Tape Other (See Comments)   TAPE WILL TEAR THE SKIN!!!      Medication List    TAKE these medications   acetaminophen 500 MG tablet Commonly known as:  TYLENOL Take 1,000 mg by mouth every 6 (six) hours as needed for pain or fever.   apixaban 5 MG Tabs tablet Commonly known as:  ELIQUIS Take 1 tablet (5 mg total) by mouth 2 (two) times daily.   atorvastatin 80 MG tablet Commonly known as:  LIPITOR Take 0.5 tablets (40 mg total) by mouth every evening.   cefUROXime 500 MG tablet Commonly known as:  CEFTIN Take 1 tablet (500 mg total) by mouth 2 (two) times daily for 10 days.   furosemide 20 MG tablet Commonly known as:  LASIX Take 1 tablet (20 mg total) by mouth daily as needed. For swelling   hydroxypropyl methylcellulose / hypromellose 2.5 % ophthalmic solution Commonly known as:  ISOPTO TEARS / GONIOVISC Place 1 drop into both eyes 2 (two) times daily.   multivitamin with minerals Tabs tablet Take 1 tablet by mouth daily.   nitroGLYCERIN 0.4 MG SL tablet Commonly known as:  NITROSTAT Place 0.4 mg under the tongue every 5 (five) minutes as needed for chest pain.   ranitidine 150 MG tablet Commonly known as:  ZANTAC Take 1 tablet (150 mg total) by mouth 2 (two) times daily.   SYSTANE 0.4-0.3 % Soln Generic drug:  Polyethyl Glycol-Propyl Glycol Apply 1 drop to eye 3 (three) times daily.   TUMS PO Take 3 tablets by mouth daily as needed (gas).      Follow-up Information    [facility].   Specialty:  Home Health Services Why:  Home Health Registered Nurse and Physical  Therapy Contact information: [address] [place] [address] [facility] Follow up.   Why:  community Chief Operating Officer information: [place]. [facility] [place] [phone]       Isaac Bliss, [name], MD Follow up.   Specialty:  Internal Medicine Why:  Please see on Monday for a hospital follow up. Contact information: [place] [address] [phone]        Burnell Blanks, MD .   Specialty:  Cardiology Contact information: [place] [address] [place]  [address] [phone]  Allergies  Allergen Reactions  . Pantoprazole Other (See Comments)    Headache and lightheaded  . Nitrofurantoin Hives  . Sulfa Antibiotics Hives and Itching  . Lidocaine Swelling    Mouth swelling   . Tape Other (See Comments)    TAPE WILL TEAR THE SKIN!!!     Procedures/Studies:    Dg Chest 2 View  Result Date: 03/05/2018 CLINICAL DATA:  Fever EXAM: CHEST - 2 VIEW COMPARISON:  12/28/2017 FINDINGS: Post sternotomy changes. No significant pleural effusion. Patchy atelectasis or scarring at the left base. Cardiomegaly. No pneumothorax. Elevation of left diaphragm. IMPRESSION: 1. Patchy atelectasis versus scarring at the left base 2. Cardiomegaly Electronically Signed   By: Donavan Foil M.D.   On: 03/05/2018 23:57     The results of significant diagnostics from this hospitalization (including imaging, microbiology, ancillary and laboratory) are listed below for reference.     Microbiology: Recent Results (from the past 240 hour(s))  Blood culture (routine x 2)     Status: None (Preliminary result)   Collection Time: 03/05/18 11:19 PM  Result Value Ref Range Status   Specimen Description BLOOD RIGHT HAND  Final   Special Requests   Final    BOTTLES DRAWN AEROBIC AND ANAEROBIC Blood Culture adequate volume   Culture   Final    NO  GROWTH 4 DAYS Performed at [facility], [address] [place], [place] [address]    Report Status PENDING  Incomplete  Urine culture     Status: Abnormal   Collection Time: 03/05/18 11:39 PM  Result Value Ref Range Status   Specimen Description URINE, RANDOM  Final   Special Requests   Final    NONE Performed at [facility], [place] [address] [place], [place] [address]    Culture (A)  Final    >=100,000 COLONIES/mL KLEBSIELLA PNEUMONIAE >=100,000 COLONIES/mL STAPHYLOCOCCUS AUREUS    Report Status 03/09/2018 FINAL  Final   Organism ID, Bacteria KLEBSIELLA PNEUMONIAE (A)  Final   Organism ID, Bacteria STAPHYLOCOCCUS AUREUS (A)  Final      Susceptibility   Klebsiella pneumoniae - MIC*    AMPICILLIN >=32 RESISTANT Resistant     CEFAZOLIN <=4 SENSITIVE Sensitive     CEFTRIAXONE <=1 SENSITIVE Sensitive     CIPROFLOXACIN <=0.25 SENSITIVE Sensitive     GENTAMICIN <=1 SENSITIVE Sensitive     IMIPENEM <=0.25 SENSITIVE Sensitive     NITROFURANTOIN <=16 SENSITIVE Sensitive     TRIMETH/SULFA <=20 SENSITIVE Sensitive     AMPICILLIN/SULBACTAM 8 SENSITIVE Sensitive     PIP/TAZO <=4 SENSITIVE Sensitive     Extended ESBL NEGATIVE Sensitive     * >=100,000 COLONIES/mL KLEBSIELLA PNEUMONIAE   Staphylococcus aureus - MIC*    CIPROFLOXACIN >=8 RESISTANT Resistant     GENTAMICIN <=0.5 SENSITIVE Sensitive     NITROFURANTOIN <=16 SENSITIVE Sensitive     OXACILLIN 0.5 SENSITIVE Sensitive     TETRACYCLINE <=1 SENSITIVE Sensitive     VANCOMYCIN <=0.5 SENSITIVE Sensitive     TRIMETH/SULFA <=10 SENSITIVE Sensitive     CLINDAMYCIN <=0.25 SENSITIVE Sensitive     RIFAMPIN <=0.5 SENSITIVE Sensitive     Inducible Clindamycin NEGATIVE Sensitive     * >=100,000 COLONIES/mL STAPHYLOCOCCUS AUREUS  Blood culture (routine x 2)     Status: Abnormal   Collection Time: 03/06/18 12:01 AM  Result Value Ref Range Status   Specimen Description BLOOD LEFT ANTECUBITAL  Final   Special Requests   Final  BOTTLES DRAWN AEROBIC AND ANAEROBIC Blood Culture adequate volume   Culture  Setup Time   Final    GRAM NEGATIVE RODS ANAEROBIC BOTTLE ONLY CRITICAL RESULT CALLED TO, READ BACK BY AND VERIFIED WITH: M. PHAM, PHARMD AT 1420 ON 03/06/18 BY C. JESSUP, MLT. Performed at [facility], [place] [address], [place], [place] [address]    Culture KLEBSIELLA PNEUMONIAE (A)  Final   Report Status 03/08/2018 FINAL  Final   Organism ID, Bacteria KLEBSIELLA PNEUMONIAE  Final      Susceptibility   Klebsiella pneumoniae - MIC*    AMPICILLIN >=32 RESISTANT Resistant     CEFAZOLIN <=4 SENSITIVE Sensitive     CEFEPIME <=1 SENSITIVE Sensitive     CEFTAZIDIME <=1 SENSITIVE Sensitive     CEFTRIAXONE <=1 SENSITIVE Sensitive     CIPROFLOXACIN <=0.25 SENSITIVE Sensitive     GENTAMICIN <=1 SENSITIVE Sensitive     IMIPENEM <=0.25 SENSITIVE Sensitive     TRIMETH/SULFA <=20 SENSITIVE Sensitive     AMPICILLIN/SULBACTAM 8 SENSITIVE Sensitive     PIP/TAZO <=4 SENSITIVE Sensitive     Extended ESBL NEGATIVE Sensitive     * KLEBSIELLA PNEUMONIAE  Blood Culture ID Panel (Reflexed)     Status: Abnormal   Collection Time: 03/06/18 12:01 AM  Result Value Ref Range Status   Enterococcus species NOT DETECTED NOT DETECTED Final   Listeria monocytogenes NOT DETECTED NOT DETECTED Final   Staphylococcus species NOT DETECTED NOT DETECTED Final   Staphylococcus aureus (BCID) NOT DETECTED NOT DETECTED Final   Streptococcus species NOT DETECTED NOT DETECTED Final   Streptococcus agalactiae NOT DETECTED NOT DETECTED Final   Streptococcus pneumoniae NOT DETECTED NOT DETECTED Final   Streptococcus pyogenes NOT DETECTED NOT DETECTED Final   Acinetobacter baumannii NOT DETECTED NOT DETECTED Final   Enterobacteriaceae species DETECTED (A) NOT DETECTED Final    Comment: Enterobacteriaceae represent a large family of gram-negative bacteria, not a single organism. CRITICAL RESULT CALLED TO, READ BACK BY AND VERIFIED WITH: M.  PHAM, PHARMD AT 1420 ON 03/06/18 BY C. JESSUP, MLT.    Enterobacter cloacae complex NOT DETECTED NOT DETECTED Final   Escherichia coli NOT DETECTED NOT DETECTED Final   Klebsiella oxytoca NOT DETECTED NOT DETECTED Final   Klebsiella pneumoniae DETECTED (A) NOT DETECTED Final    Comment: CRITICAL RESULT CALLED TO, READ BACK BY AND VERIFIED WITH: M. PHAM, PHARMD AT 1420 ON 03/06/18 BY C. JESSUP, MLT.    Proteus species NOT DETECTED NOT DETECTED Final   Serratia marcescens NOT DETECTED NOT DETECTED Final   Carbapenem resistance NOT DETECTED NOT DETECTED Final   Haemophilus influenzae NOT DETECTED NOT DETECTED Final   Neisseria meningitidis NOT DETECTED NOT DETECTED Final   Pseudomonas aeruginosa NOT DETECTED NOT DETECTED Final   Candida albicans NOT DETECTED NOT DETECTED Final   Candida glabrata NOT DETECTED NOT DETECTED Final   Candida krusei NOT DETECTED NOT DETECTED Final   Candida parapsilosis NOT DETECTED NOT DETECTED Final   Candida tropicalis NOT DETECTED NOT DETECTED Final    Comment: Performed at [facility], [place] [address], [place], [place] [address]     Labs: BNP (last 3 results) Recent Labs    12/26/17 0349  BNP 829.5*   Basic Metabolic Panel: Recent Labs  Lab 03/05/18 2319 03/07/18 0444 03/08/18 0336  NA 137 140 141  K 4.0 3.9 4.7  CL 103 108 106  CO2 23 24 26   GLUCOSE 142* 108* 111*  BUN 26* 24* 22  CREATININE 1.57* 1.28* 1.27*  CALCIUM 9.5 8.8* 8.9   Liver Function Tests: Recent Labs  Lab 03/05/18 2319  AST 24  ALT 19  ALKPHOS 73  BILITOT 0.9  PROT 6.6  ALBUMIN 3.8   No results for input(s): LIPASE, AMYLASE in the last 168 hours. No results for input(s): AMMONIA in the last 168 hours. CBC: Recent Labs  Lab 03/05/18 2319 03/08/18 0336  WBC 13.2* 7.0  NEUTROABS 11.5*  --   HGB 13.1 12.0*  HCT 42.2 38.8*  MCV 91.7 91.7  PLT 161 139*   Cardiac Enzymes: No results for input(s): CKTOTAL, CKMB, CKMBINDEX, TROPONINI in the last 168  hours. BNP: Invalid input(s): POCBNP CBG: No results for input(s): GLUCAP in the last 168 hours. D-Dimer No results for input(s): DDIMER in the last 72 hours. Hgb A1c No results for input(s): HGBA1C in the last 72 hours. Lipid Profile No results for input(s): CHOL, HDL, LDLCALC, TRIG, CHOLHDL, LDLDIRECT in the last 72 hours. Thyroid function studies No results for input(s): TSH, T4TOTAL, T3FREE, THYROIDAB in the last 72 hours.  Invalid input(s): FREET3 Anemia work up No results for input(s): VITAMINB12, FOLATE, FERRITIN, TIBC, IRON, RETICCTPCT in the last 72 hours. Urinalysis    Component Value Date/Time   COLORURINE YELLOW 03/05/2018 2339   APPEARANCEUR CLOUDY (A) 03/05/2018 2339   LABSPEC 1.020 03/05/2018 2339   PHURINE 6.0 03/05/2018 2339   GLUCOSEU NEGATIVE 03/05/2018 2339   HGBUR LARGE (A) 03/05/2018 2339   BILIRUBINUR NEGATIVE 03/05/2018 2339   BILIRUBINUR neg 06/30/2014 1530   KETONESUR NEGATIVE 03/05/2018 2339   PROTEINUR 100 (A) 03/05/2018 2339   UROBILINOGEN 0.2 12/04/2014 2344   NITRITE POSITIVE (A) 03/05/2018 2339   LEUKOCYTESUR LARGE (A) 03/05/2018 2339   Sepsis Labs Invalid input(s): PROCALCITONIN,  WBC,  LACTICIDVEN Microbiology Recent Results (from the past 240 hour(s))  Blood culture (routine x 2)     Status: None (Preliminary result)   Collection Time: 03/05/18 11:19 PM  Result Value Ref Range Status   Specimen Description BLOOD RIGHT HAND  Final   Special Requests   Final    BOTTLES DRAWN AEROBIC AND ANAEROBIC Blood Culture adequate volume   Culture   Final    NO GROWTH 4 DAYS Performed at [facility], [place] [address], [place], [place] [address]    Report Status PENDING  Incomplete  Urine culture     Status: Abnormal   Collection Time: 03/05/18 11:39 PM  Result Value Ref Range Status   Specimen Description URINE, RANDOM  Final   Special Requests   Final    NONE Performed at [facility], [place] [address], [place], [place] [address]     Culture (A)  Final    >=100,000 COLONIES/mL KLEBSIELLA PNEUMONIAE >=100,000 COLONIES/mL STAPHYLOCOCCUS AUREUS    Report Status 03/09/2018 FINAL  Final   Organism ID, Bacteria KLEBSIELLA PNEUMONIAE (A)  Final   Organism ID, Bacteria STAPHYLOCOCCUS AUREUS (A)  Final      Susceptibility   Klebsiella pneumoniae - MIC*    AMPICILLIN >=32 RESISTANT Resistant     CEFAZOLIN <=4 SENSITIVE Sensitive     CEFTRIAXONE <=1 SENSITIVE Sensitive     CIPROFLOXACIN <=0.25 SENSITIVE Sensitive     GENTAMICIN <=1 SENSITIVE Sensitive     IMIPENEM <=0.25 SENSITIVE Sensitive     NITROFURANTOIN <=16 SENSITIVE Sensitive     TRIMETH/SULFA <=20 SENSITIVE Sensitive     AMPICILLIN/SULBACTAM 8 SENSITIVE Sensitive     PIP/TAZO <=4 SENSITIVE Sensitive  Extended ESBL NEGATIVE Sensitive     * >=100,000 COLONIES/mL KLEBSIELLA PNEUMONIAE   Staphylococcus aureus - MIC*    CIPROFLOXACIN >=8 RESISTANT Resistant     GENTAMICIN <=0.5 SENSITIVE Sensitive     NITROFURANTOIN <=16 SENSITIVE Sensitive     OXACILLIN 0.5 SENSITIVE Sensitive     TETRACYCLINE <=1 SENSITIVE Sensitive     VANCOMYCIN <=0.5 SENSITIVE Sensitive     TRIMETH/SULFA <=10 SENSITIVE Sensitive     CLINDAMYCIN <=0.25 SENSITIVE Sensitive     RIFAMPIN <=0.5 SENSITIVE Sensitive     Inducible Clindamycin NEGATIVE Sensitive     * >=100,000 COLONIES/mL STAPHYLOCOCCUS AUREUS  Blood culture (routine x 2)     Status: Abnormal   Collection Time: 03/06/18 12:01 AM  Result Value Ref Range Status   Specimen Description BLOOD LEFT ANTECUBITAL  Final   Special Requests   Final    BOTTLES DRAWN AEROBIC AND ANAEROBIC Blood Culture adequate volume   Culture  Setup Time   Final    GRAM NEGATIVE RODS ANAEROBIC BOTTLE ONLY CRITICAL RESULT CALLED TO, READ BACK BY AND VERIFIED WITH: M. PHAM, PHARMD AT 1420 ON 03/06/18 BY C. JESSUP, MLT. Performed at [facility], [place] [address], [place], [place] [address]    Culture KLEBSIELLA PNEUMONIAE (A)  Final   Report  Status 03/08/2018 FINAL  Final   Organism ID, Bacteria KLEBSIELLA PNEUMONIAE  Final      Susceptibility   Klebsiella pneumoniae - MIC*    AMPICILLIN >=32 RESISTANT Resistant     CEFAZOLIN <=4 SENSITIVE Sensitive     CEFEPIME <=1 SENSITIVE Sensitive     CEFTAZIDIME <=1 SENSITIVE Sensitive     CEFTRIAXONE <=1 SENSITIVE Sensitive     CIPROFLOXACIN <=0.25 SENSITIVE Sensitive     GENTAMICIN <=1 SENSITIVE Sensitive     IMIPENEM <=0.25 SENSITIVE Sensitive     TRIMETH/SULFA <=20 SENSITIVE Sensitive     AMPICILLIN/SULBACTAM 8 SENSITIVE Sensitive     PIP/TAZO <=4 SENSITIVE Sensitive     Extended ESBL NEGATIVE Sensitive     * KLEBSIELLA PNEUMONIAE  Blood Culture ID Panel (Reflexed)     Status: Abnormal   Collection Time: 03/06/18 12:01 AM  Result Value Ref Range Status   Enterococcus species NOT DETECTED NOT DETECTED Final   Listeria monocytogenes NOT DETECTED NOT DETECTED Final   Staphylococcus species NOT DETECTED NOT DETECTED Final   Staphylococcus aureus (BCID) NOT DETECTED NOT DETECTED Final   Streptococcus species NOT DETECTED NOT DETECTED Final   Streptococcus agalactiae NOT DETECTED NOT DETECTED Final   Streptococcus pneumoniae NOT DETECTED NOT DETECTED Final   Streptococcus pyogenes NOT DETECTED NOT DETECTED Final   Acinetobacter baumannii NOT DETECTED NOT DETECTED Final   Enterobacteriaceae species DETECTED (A) NOT DETECTED Final    Comment: Enterobacteriaceae represent a large family of gram-negative bacteria, not a single organism. CRITICAL RESULT CALLED TO, READ BACK BY AND VERIFIED WITH: M. PHAM, PHARMD AT 1420 ON 03/06/18 BY C. JESSUP, MLT.    Enterobacter cloacae complex NOT DETECTED NOT DETECTED Final   Escherichia coli NOT DETECTED NOT DETECTED Final   Klebsiella oxytoca NOT DETECTED NOT DETECTED Final   Klebsiella pneumoniae DETECTED (A) NOT DETECTED Final    Comment: CRITICAL RESULT CALLED TO, READ BACK BY AND VERIFIED WITH: M. PHAM, PHARMD AT 1420 ON 03/06/18 BY C.  JESSUP, MLT.    Proteus species NOT DETECTED NOT DETECTED Final   Serratia marcescens NOT DETECTED NOT DETECTED Final   Carbapenem resistance NOT DETECTED NOT DETECTED Final  Haemophilus influenzae NOT DETECTED NOT DETECTED Final   Neisseria meningitidis NOT DETECTED NOT DETECTED Final   Pseudomonas aeruginosa NOT DETECTED NOT DETECTED Final   Candida albicans NOT DETECTED NOT DETECTED Final   Candida glabrata NOT DETECTED NOT DETECTED Final   Candida krusei NOT DETECTED NOT DETECTED Final   Candida parapsilosis NOT DETECTED NOT DETECTED Final   Candida tropicalis NOT DETECTED NOT DETECTED Final    Comment: Performed at [facility], [place] [address] [place], [place] [address]     Time coordinating discharge in minutes: 69  SIGNED:   Debbe Odea, MD  [facility] 03/10/2018, 3:20 PM Pager   If 7PM-7AM, please contact night-coverage [email or web address] Password [email or web address]

## 2018-03-08 NOTE — Progress Notes (Signed)
Patient discharged to home. Patient AVS reviewed and signed. Patient capable re-verbalizing medications and follow-up appointments. IV removed. Patient belongings sent with patient. Patient educated to return to the ED in the event of SOB, chest pain or dizziness.   Rashidi Loh B. RN 

## 2018-03-08 NOTE — Care Management Note (Signed)
Case Management Note Note originated by Midge Minium, RN CM  Patient Details  Name: Ronald [name] MRN: [id number] Date of Birth: 1927/07/21  Subjective/Objective:  84yo male presented with fever and chills.                 Action/Plan: CM met with patient/spouse to discuss dispositional needs. Patient reports living at home with spouse, independent with ADLs with some assistance provided by spouse d/t chronic foley; occasionally ambulates with a SPC. PCP verified as: Dr. Lelon Frohlich; [facility]. CM spoke to [place], [place], [place] and provided an update on the patients POC; please fax progress notes/DC summary to [phone] ATTN: Dr. Ishmael Holter. CM discussed [facility], with patient/spouse agreeable with referral entered in Epic. PT recommended HHPT, with patient/spouse agreeable to [facility] services and indicated patient was recently opened with [facility]. CMS HH compare list provided, with patient/spouse requesting [facility] for recommended services. Patient will need orders for HHRN/PT and F2F. [place] referral given to Butch Penny [facility] liaison; AVS updated. Spouse will provide transportation home. CM team will continue to follow.   Expected Discharge Date:  03/08/18               Expected Discharge Plan:  [place]  In-House Referral:  [facility]  Discharge planning Services  CM Consult  Post Acute Care Choice:  Home Health Choice offered to:  Patient, Spouse  DME Arranged:  N/A DME Agency:  [place]:  PT [place] Agency:  [place]  Status of Service:  Completed, signed off  If discussed at [place] of Stay Meetings, dates discussed:    Additional Comments:  Midge Minium RN, BSN, NCM-BC, ACM-RN [phone] 03/08/2018, 11:27 AM Case Management Note  Patient Details  Name: Ronald [name] MRN: [id number] Date of Birth: 04-29-1927  Subjective/Objective:                    Action/Plan:   Expected Discharge Date:   03/08/18               Expected Discharge Plan:  [place]  In-House Referral:  [facility]  Discharge planning Services  CM Consult  Post Acute Care Choice:  Home Health Choice offered to:  Patient, Spouse  DME Arranged:  N/A DME Agency:  [place]:  PT [facility] Agency:  [place]  Status of Service:  Completed, signed off  If discussed at [place] of Stay Meetings, dates discussed:    Additional Comments: 03/08/18 11:31 AM Bartholomew Crews, RN CM spoke with Butch Penny at [facility] that only PT services at this time and plans for transition to home today. Discharge summary and progress notes faxed to [facility] [phone]. No other needs identified at this time.   Bartholomew Crews, RN 03/08/2018, 11:27 AM

## 2018-03-09 LAB — URINE CULTURE: Culture: >=100000 — AB

## 2018-03-12 ENCOUNTER — Telehealth: Payer: Self-pay | Admitting: *Deleted

## 2018-03-12 NOTE — Telephone Encounter (Signed)
Copied from [place] [phone]. Topic: Quick Communication - Home Health Verbal Orders >> Mar 12, 2018  9:52 AM Hewitt Shorts wrote: Caller/Agency: [name] Callback Number: [phone] Requesting OT/PT/Skilled Nursing/Social Work: pt was evaluated for skilled nursing from being released from hospital but does not needed and has not been admitted to [facility]  Frequency:

## 2018-03-13 ENCOUNTER — Inpatient Hospital Stay: Payer: Medicare Other | Admitting: Internal Medicine

## 2018-03-13 ENCOUNTER — Ambulatory Visit: Payer: Self-pay

## 2018-03-13 LAB — CULTURE, BLOOD (ROUTINE X 2)
Culture: NO GROWTH
Special Requests: ADEQUATE

## 2018-03-13 NOTE — Telephone Encounter (Signed)
Call returned to wife.  Voiced frustration about discharge instructions on Ceftin; Rx instructions were to take BID for total of 10 days, but was only given enough for 8 days dosing.  Informed pt's wife that the Discharge Summary explained that pt. had rec'd 2 days of an IV antibiotic, in the same drug classification, while in the hospital.  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 Wife verb. understanding, and appreciation of the information.  Noted pt. has hosp. F/u appt. Tomorrow, 12/18, and advised wife to discuss further with Dr. Jerilee Hoh.  Agreed with plan.    Reason for Disposition . Caller has medication question only, adult not sick, and triager answers question  Answer Assessment - Initial Assessment Questions 1. SYMPTOMS: "Do you have any symptoms?"     No  2. SEVERITY: If symptoms are present, ask "Are they mild, moderate or severe?"     N/a     Has questions about quantity dispensed on the antibiotic from discharged.  Protocols used: MEDICATION QUESTION CALL-A-AH

## 2018-03-13 NOTE — Telephone Encounter (Signed)
I called [name] and informed him of the verbal order below.  He stated he wanted to let Dr Jerilee Hoh know the services were discontinued as he evaluated the patient and he does not need the services that were ordered when discharged.  Message sent to Dr Jerilee Hoh as [name].

## 2018-03-13 NOTE — Telephone Encounter (Signed)
Referral to [facility] is ok with me.

## 2018-03-14 ENCOUNTER — Ambulatory Visit (INDEPENDENT_AMBULATORY_CARE_PROVIDER_SITE_OTHER): Payer: Medicare Other | Admitting: Internal Medicine

## 2018-03-14 VITALS — BP 140/80 | HR 62 | Temp 97.9°F | Ht 65.0 in | Wt 154.5 lb

## 2018-03-14 DIAGNOSIS — I255 Ischemic cardiomyopathy: Secondary | ICD-10-CM

## 2018-03-14 DIAGNOSIS — R7881 Bacteremia: Secondary | ICD-10-CM | POA: Diagnosis not present

## 2018-03-14 DIAGNOSIS — N390 Urinary tract infection, site not specified: Secondary | ICD-10-CM | POA: Diagnosis not present

## 2018-03-14 NOTE — Progress Notes (Signed)
Established Patient Office Visit     CC/Reason for Visit: Hospital follow-up  HPI: Ronald [name] is a 84 y.o. male who is coming in today for the above mentioned reasons.  He was admitted to the hospital from 12/9 through 03/10/2018 due to Klebsiella sepsis and UTI related to indwelling suprapubic catheter.  I have reviewed his hospital records in detail.  He comes in today accompanied by his wife.  His wife states that the evening of admission he told her he was going to go to bed early because he was freezing.  She checked his temperature and it was 100.7.  Due to his history of repeated catheter associated UTIs, she decided to seek help in the emergency department.  Suprapubic catheter was exchanged in the ED.  Blood culture was subsequently positive for Klebsiella.  He was given 2 days of meropenem in the hospital and then transitioned over to Ceftin upon discharge.  Urine culture was positive for Klebsiella only resistant to ampicillin.  He comes in today, has 4 pills left of his antibiotic course, has had no further fevers, chills, abdominal pain since discharge.  Past Medical/Surgical History: Past Medical History:  Diagnosis Date  . Arthritis    "joints ache" (01/02/2014)  . At risk for sleep apnea    STOP-BANG= 4    SENT TO PCP 12-23-2013  . Bladder calculi   . CHF (congestive heart failure) ([place])   . Chronic cystitis   . Coronary artery disease CARDIOLOGIST-  DR Grace Bushy  [place] -- S/P  11/89 CABG x6 (70% circ; 90% PD; 60-70% distal left main; 30% left circ; 90% 1st diag;, 2nd diag and 3rd diag. w/90%,  mild stenosis LAD and 60-70% pLAD)  Re-do CABG x5 in 1997  . Diverticulosis   . GERD (gastroesophageal reflux disease)   . History of Bell's palsy    RIGHT SIDE-- NO RESIDUAL  . Hx of dizziness   . Hyperlipidemia   . Hypertension    "not anymore" (01/02/2014)  . Ischemic cardiomyopathy    ef 35-40% per cath 08-28-2013  . Kidney stones "years ago"   "passed them"  .  Melanoma of ear ([place])    "right"  . Mild dementia ([place])   . Myocardial infarction ([place]) 1986; 1997  . Nocturia   . OAB (overactive bladder)   . Prostate cancer ([place]) 1998   S/P  [place]; [place]  . S/P CABG (coronary artery bypass graft)    [place]  . Skipped heart beats    occasional  . Urethral stricture   . UTI (urinary tract infection) 09/2015  . Wears hearing aid    bilateral  . Wears partial dentures     Past Surgical History:  Procedure Laterality Date  . CARDIAC CATHETERIZATION  02-22-2005  dr Vidal Schwalbe   mild to moderate lv dysfunction with inferobasilar akinesis/  totally occluded SVG to Intermediate Diagonal and SVG to PDA and RCA branches, totally occluded native coronary circulation with diffuse disease pLAD diagonal system with potentially could be ischemic/  patent SVG to OM with collaterals to dRCA and patent LIMA to LAD and diagonal systemss  . CARDIAC CATHETERIZATION  08-28-2013  DR Daneen Schick   widely patent sequential left internal graft to the diagonal/LAD, widely patent SVG to OM with proximal 50% narrowing noted in the graft, total occlusion SVG's to RCA, RI, and the Diagonal/ LV dysfunction with inferobasal  aneurysm and mid anterior wall region of akinesis/  overall EF 35-40%/  Total occlusion of the navtive circulation/  no significant change compared to 2006 cath  . CARDIOVASCULAR STRESS TEST  08-01-2011  dr Angelena Form   inferior scar and possible soft tissue attenuation with minimal peri-infarct ischemia, small region of anterior ischemia and scar/  LVEF 53% LV wall motion with inferior hypokinesis/ no significant change from scan july 2011  . CATARACT EXTRACTION W/ INTRAOCULAR LENS  IMPLANT, BILATERAL Bilateral 2007  . CORONARY ARTERY BYPASS GRAFT  11/ [place]-- 6 vessel/  1997 Re-do 5 vessel  . CYSTOSCOPY WITH URETHRAL DILATATION N/A 12/25/2013   Procedure: CYSTOSCOPY WITH URETHRAL DILATATION, WITH  BIOPSY;  Surgeon: Bernestine Amass, MD;  Location: [facility];  Service: Urology;  Laterality: N/A;  . CYSTOSCOPY WITH URETHRAL DILATATION N/A 12/22/2014   Procedure: CYSTOSCOPY WITH URETHRAL DILATATION;  Surgeon: Rana Snare, MD;  Location: [facility];  Service: Urology;  Laterality: N/A;  BALLOON DILATION CATHETER    . EUS  10/05/2011   Procedure: ESOPHAGEAL ENDOSCOPIC ULTRASOUND (EUS) RADIAL;  Surgeon: Arta Silence, MD;  [facility];  Service: Endoscopy;  Laterality: N/A;  . INSERTION OF SUPRAPUBIC CATHETER N/A 12/22/2014   Procedure: INSERTION OF SUPRAPUBIC CATHETER ;  Surgeon: Rana Snare, MD;  [facility];  Service: Urology;  Laterality: N/A;  . LAPAROSCOPIC CHOLECYSTECTOMY  12-23-2005  . LEFT HEART CATHETERIZATION WITH CORONARY/GRAFT ANGIOGRAM N/A 08/28/2013   Procedure: LEFT HEART CATHETERIZATION WITH Beatrix Fetters;  Surgeon: Sinclair Grooms, MD;  Location: [facility];  Service: Cardiovascular;  Laterality: N/A;  . MELANOMA EXCISION  X 1   "ear"  . RADIOACTIVE PROSTATE SEED IMPLANTS  1998  . SKIN CANCER EXCISION  X 2   "top of head"  . TONSILLECTOMY AND ADENOIDECTOMY  1954    Social History:  reports that he quit smoking about 34 years ago. His smoking use included cigars. He quit after 40.00 years of use. He quit smokeless tobacco use about 4 years ago.  His smokeless tobacco use included chew. He reports that he does not drink alcohol or use drugs.  Allergies: Allergies  Allergen Reactions  . Pantoprazole Other (See Comments)    Headache and lightheaded  . Nitrofurantoin Hives  . Sulfa Antibiotics Hives and Itching  . Lidocaine Swelling    Mouth swelling   . Tape Other (See Comments)    TAPE WILL TEAR THE SKIN!!!    Family History:  Family History  Problem Relation Age of Onset  . Heart disease Mother   . Diabetes Father   . Heart disease Brother      Current Outpatient Medications:  .  acetaminophen (TYLENOL) 500 MG  tablet, Take 1,000 mg by mouth every 6 (six) hours as needed for pain or fever. , Disp: , Rfl:  .  apixaban (ELIQUIS) 5 MG TABS tablet, Take 1 tablet (5 mg total) by mouth 2 (two) times daily., Disp: 60 tablet, Rfl: 11 .  atorvastatin (LIPITOR) 80 MG tablet, Take 0.5 tablets (40 mg total) by mouth every evening., Disp: , Rfl:  .  Calcium Carbonate Antacid (TUMS PO), Take 3 tablets by mouth daily as needed (gas)., Disp: , Rfl:  .  cefUROXime (CEFTIN) 500 MG tablet, Take 1 tablet (500 mg total) by mouth 2 (two) times daily for 10 days., Disp: 16 tablet, Rfl: 0 .  furosemide (LASIX) 20 MG tablet, Take 1 tablet (20 mg  total) by mouth daily as needed. For swelling, Disp: 30 tablet, Rfl: 6 .  hydroxypropyl methylcellulose (ISOPTO TEARS) 2.5 % ophthalmic solution, Place 1 drop into both eyes 2 (two) times daily. , Disp: , Rfl:  .  Multiple Vitamin (MULTIVITAMIN WITH MINERALS) TABS, Take 1 tablet by mouth daily., Disp: , Rfl:  .  nitroGLYCERIN (NITROSTAT) 0.4 MG SL tablet, Place 0.4 mg under the tongue every 5 (five) minutes as needed for chest pain. , Disp: , Rfl:  .  Polyethyl Glycol-Propyl Glycol (SYSTANE) 0.4-0.3 % SOLN, Apply 1 drop to eye 3 (three) times daily., Disp: , Rfl:  .  ranitidine (ZANTAC) 150 MG tablet, Take 1 tablet (150 mg total) by mouth 2 (two) times daily., Disp: , Rfl:   Review of Systems:  Constitutional: Denies fever, chills, diaphoresis, appetite change and fatigue.  HEENT: Denies photophobia, eye pain, redness, hearing loss, ear pain, congestion, sore throat, rhinorrhea, sneezing, mouth sores, trouble swallowing, neck pain, neck stiffness and tinnitus.   Respiratory: Denies SOB, DOE, cough, chest tightness,  and wheezing.   Cardiovascular: Denies chest pain, palpitations and leg swelling.  Gastrointestinal: Denies nausea, vomiting, abdominal pain, diarrhea, constipation, blood in stool and abdominal distention.  Genitourinary: Denies dysuria, urgency, frequency, hematuria, flank  pain and difficulty urinating.  Endocrine: Denies: hot or cold intolerance, sweats, changes in hair or nails, polyuria, polydipsia. Musculoskeletal: Denies myalgias, back pain, joint swelling, arthralgias and gait problem.  Skin: Denies pallor, rash and wound.  Neurological: Denies dizziness, seizures, syncope, weakness, light-headedness, numbness and headaches.  Hematological: Denies adenopathy. Easy bruising, personal or family bleeding history  Psychiatric/Behavioral: Denies suicidal ideation, mood changes, confusion, nervousness, sleep disturbance and agitation    Physical Exam: Vitals:   03/14/18 1559  BP: 140/80  Pulse: 62  Temp: 97.9 F (36.6 C)  TempSrc: Oral  SpO2: 98%  Weight: 154 lb 8 oz (70.1 kg)  Height: 5\' 5"  (1.651 m)    Body mass index is 25.71 kg/m.   Constitutional: NAD, calm, comfortable Eyes: PERRL, lids and conjunctivae normal ENMT: Mucous membranes are moist. Respiratory: clear to auscultation bilaterally, no wheezing, no crackles. Normal respiratory effort. No accessory muscle use.  Cardiovascular: Regular rate and rhythm, no murmurs / rubs / gallops.  Trace bilateral lower extremity edema. 2+ pedal pulses. No carotid bruits.  Abdomen: no tenderness, no masses palpated. No hepatosplenomegaly. Bowel sounds positive.  Musculoskeletal: no clubbing / cyanosis. No joint deformity upper and lower extremities. Good ROM, no contractures. Normal muscle tone.  Neurologic: CN 2-12 grossly intact. Sensation intact, DTR normal. Strength 5/5 in all 4.  Psychiatric: Normal judgment and insight. Alert and oriented x 3. Normal mood.    Impression and Plan:  Klebsiella sepsis due to UTI associated with indwelling suprapubic catheter -Has had no issues since hospital discharge, has 4 tablets remaining of his antibiotic therapy. -Wife is confused because instructions for cefuroxime state to take twice daily for 10 more days and she was only given 16 pills. -I have  reviewed discharge summary with her, it was the hospitalists intention to treat for a total of 10 days and he had received 2 days worth of IV meropenem while hospitalized. -He has been advised to complete antibiotic course as prescribed. -He is advised to call us if any issues arise. -He will follow-up with [place] as previously scheduled.    Patient Instructions  -It was nice seeing you today!  -Glad you are doing better after your hospital stay.  -Please make sure you complete your  course of antibiotics.  -Follow up with me as scheduled.     Lelon Frohlich, MD [facility] at [facility]

## 2018-03-14 NOTE — Progress Notes (Deleted)
Established Patient Office Visit     CC/Reason for Visit: ***  HPI: Ronald [name] is a 84 y.o. male who is coming in today for the above mentioned reasons. Past Medical History is significant for: ***   Past Medical/Surgical History: Past Medical History:  Diagnosis Date  . Arthritis    "joints ache" (01/02/2014)  . At risk for sleep apnea    STOP-BANG= 4    SENT TO PCP 12-23-2013  . Bladder calculi   . CHF (congestive heart failure) ([place])   . Chronic cystitis   . Coronary artery disease CARDIOLOGIST-  DR Grace Bushy  MI -- S/P  11/89 CABG x6 (70% circ; 90% PD; 60-70% distal left main; 30% left circ; 90% 1st diag;, 2nd diag and 3rd diag. w/90%,  mild stenosis LAD and 60-70% pLAD)  Re-do CABG x5 in 1997  . Diverticulosis   . GERD (gastroesophageal reflux disease)   . History of Bell's palsy    RIGHT SIDE-- NO RESIDUAL  . Hx of dizziness   . Hyperlipidemia   . Hypertension    "not anymore" (01/02/2014)  . Ischemic cardiomyopathy    ef 35-40% per cath 08-28-2013  . Kidney stones "years ago"   "passed them"  . Melanoma of ear ([place])    "right"  . Mild dementia ([place])   . Myocardial infarction ([place]) 1986; 1997  . Nocturia   . OAB (overactive bladder)   . Prostate cancer ([place]) 1998   S/P  [place]; [place]  . S/P CABG (coronary artery bypass graft)    [place]  . Skipped heart beats    occasional  . Urethral stricture   . UTI (urinary tract infection) 09/2015  . Wears hearing aid    bilateral  . Wears partial dentures     Past Surgical History:  Procedure Laterality Date  . CARDIAC CATHETERIZATION  02-22-2005  dr Vidal Schwalbe   mild to moderate lv dysfunction with inferobasilar akinesis/  totally occluded SVG to Intermediate Diagonal and SVG to PDA and RCA branches, totally occluded native coronary circulation with diffuse disease pLAD diagonal system with potentially could be ischemic/  patent SVG to OM with collaterals to  dRCA and patent LIMA to LAD and diagonal systemss  . CARDIAC CATHETERIZATION  08-28-2013  DR Daneen Schick   widely patent sequential left internal graft to the diagonal/LAD, widely patent SVG to OM with proximal 50% narrowing noted in the graft, total occlusion SVG's to RCA, RI, and the Diagonal/ LV dysfunction with inferobasal aneurysm and mid anterior wall region of akinesis/  overall EF 35-40%/  Total occlusion of the navtive circulation/  no significant change compared to 2006 cath  . CARDIOVASCULAR STRESS TEST  08-01-2011  dr Angelena Form   inferior scar and possible soft tissue attenuation with minimal peri-infarct ischemia, small region of anterior ischemia and scar/  LVEF 53% LV wall motion with inferior hypokinesis/ no significant change from scan july 2011  . CATARACT EXTRACTION W/ INTRAOCULAR LENS  IMPLANT, BILATERAL Bilateral 2007  . CORONARY ARTERY BYPASS GRAFT  11/ [place]-- 6 vessel/  1997 Re-do 5 vessel  . CYSTOSCOPY WITH URETHRAL DILATATION N/A 12/25/2013   Procedure: CYSTOSCOPY WITH URETHRAL DILATATION, WITH BIOPSY;  Surgeon: Bernestine Amass, MD;  Location: [facility];  Service: Urology;  Laterality: N/A;  . CYSTOSCOPY WITH URETHRAL DILATATION N/A 12/22/2014  Procedure: CYSTOSCOPY WITH URETHRAL DILATATION;  Surgeon: Rana Snare, MD;  [facility];  Service: Urology;  Laterality: N/A;  BALLOON DILATION CATHETER    . EUS  10/05/2011   Procedure: ESOPHAGEAL ENDOSCOPIC ULTRASOUND (EUS) RADIAL;  Surgeon: Arta Silence, MD;  [facility];  Service: Endoscopy;  Laterality: N/A;  . INSERTION OF SUPRAPUBIC CATHETER N/A 12/22/2014   Procedure: INSERTION OF SUPRAPUBIC CATHETER ;  Surgeon: Rana Snare, MD;  [facility];  Service: Urology;  Laterality: N/A;  . LAPAROSCOPIC CHOLECYSTECTOMY  12-23-2005  . LEFT HEART CATHETERIZATION WITH CORONARY/GRAFT ANGIOGRAM N/A 08/28/2013   Procedure: LEFT HEART CATHETERIZATION WITH Beatrix Fetters;   Surgeon: Sinclair Grooms, MD;  Location: [facility] CATH LAB;  Service: Cardiovascular;  Laterality: N/A;  . MELANOMA EXCISION  X 1   "ear"  . RADIOACTIVE PROSTATE SEED IMPLANTS  1998  . SKIN CANCER EXCISION  X 2   "top of head"  . TONSILLECTOMY AND ADENOIDECTOMY  1954    Social History:  reports that he quit smoking about 34 years ago. His smoking use included cigars. He quit after 40.00 years of use. He quit smokeless tobacco use about 4 years ago.  His smokeless tobacco use included chew. He reports that he does not drink alcohol or use drugs.  Allergies: Allergies  Allergen Reactions  . Pantoprazole Other (See Comments)    Headache and lightheaded  . Nitrofurantoin Hives  . Sulfa Antibiotics Hives and Itching  . Lidocaine Swelling    Mouth swelling   . Tape Other (See Comments)    TAPE WILL TEAR THE SKIN!!!    Family History: *** Family History  Problem Relation Age of Onset  . Heart disease Mother   . Diabetes Father   . Heart disease Brother      Current Outpatient Medications:  .  acetaminophen (TYLENOL) 500 MG tablet, Take 1,000 mg by mouth every 6 (six) hours as needed for pain or fever. , Disp: , Rfl:  .  apixaban (ELIQUIS) 5 MG TABS tablet, Take 1 tablet (5 mg total) by mouth 2 (two) times daily., Disp: 60 tablet, Rfl: 11 .  atorvastatin (LIPITOR) 80 MG tablet, Take 0.5 tablets (40 mg total) by mouth every evening., Disp: , Rfl:  .  Calcium Carbonate Antacid (TUMS PO), Take 3 tablets by mouth daily as needed (gas)., Disp: , Rfl:  .  cefUROXime (CEFTIN) 500 MG tablet, Take 1 tablet (500 mg total) by mouth 2 (two) times daily for 10 days., Disp: 16 tablet, Rfl: 0 .  furosemide (LASIX) 20 MG tablet, Take 1 tablet (20 mg total) by mouth daily as needed. For swelling, Disp: 30 tablet, Rfl: 6 .  hydroxypropyl methylcellulose (ISOPTO TEARS) 2.5 % ophthalmic solution, Place 1 drop into both eyes 2 (two) times daily. , Disp: , Rfl:  .  Multiple Vitamin (MULTIVITAMIN WITH  MINERALS) TABS, Take 1 tablet by mouth daily., Disp: , Rfl:  .  nitroGLYCERIN (NITROSTAT) 0.4 MG SL tablet, Place 0.4 mg under the tongue every 5 (five) minutes as needed for chest pain. , Disp: , Rfl:  .  Polyethyl Glycol-Propyl Glycol (SYSTANE) 0.4-0.3 % SOLN, Apply 1 drop to eye 3 (three) times daily., Disp: , Rfl:  .  ranitidine (ZANTAC) 150 MG tablet, Take 1 tablet (150 mg total) by mouth 2 (two) times daily., Disp: , Rfl:   Review of Systems: *** Constitutional: Denies fever, chills, diaphoresis, appetite change and fatigue.  HEENT: Denies photophobia, eye pain, redness, hearing loss, ear pain,  congestion, sore throat, rhinorrhea, sneezing, mouth sores, trouble swallowing, neck pain, neck stiffness and tinnitus.   Respiratory: Denies SOB, DOE, cough, chest tightness,  and wheezing.   Cardiovascular: Denies chest pain, palpitations and leg swelling.  Gastrointestinal: Denies nausea, vomiting, abdominal pain, diarrhea, constipation, blood in stool and abdominal distention.  Genitourinary: Denies dysuria, urgency, frequency, hematuria, flank pain and difficulty urinating.  Endocrine: Denies: hot or cold intolerance, sweats, changes in hair or nails, polyuria, polydipsia. Musculoskeletal: Denies myalgias, back pain, joint swelling, arthralgias and gait problem.  Skin: Denies pallor, rash and wound.  Neurological: Denies dizziness, seizures, syncope, weakness, light-headedness, numbness and headaches.  Hematological: Denies adenopathy. Easy bruising, personal or family bleeding history  Psychiatric/Behavioral: Denies suicidal ideation, mood changes, confusion, nervousness, sleep disturbance and agitation    Physical Exam: Vitals:   03/14/18 1559  BP: 140/80  Pulse: 62  Temp: 97.9 F (36.6 C)  TempSrc: Oral  SpO2: 98%  Weight: 154 lb 8 oz (70.1 kg)  Height: 5\' 5"  (1.651 m)    Body mass index is 25.71 kg/m.   *** Constitutional: NAD, calm, comfortable Eyes: PERRL, lids and  conjunctivae normal ENMT: Mucous membranes are moist. Posterior pharynx clear of any exudate or lesions. Normal dentition. Tympanic membrane is pearly white, no erythema or bulging. Neck: normal, supple, no masses, no thyromegaly Respiratory: clear to auscultation bilaterally, no wheezing, no crackles. Normal respiratory effort. No accessory muscle use.  Cardiovascular: Regular rate and rhythm, no murmurs / rubs / gallops. No extremity edema. 2+ pedal pulses. No carotid bruits.  Abdomen: no tenderness, no masses palpated. No hepatosplenomegaly. Bowel sounds positive.  Musculoskeletal: no clubbing / cyanosis. No joint deformity upper and lower extremities. Good ROM, no contractures. Normal muscle tone.  Skin: no rashes, lesions, ulcers. No induration Neurologic: CN 2-12 grossly intact. Sensation intact, DTR normal. Strength 5/5 in all 4.  Psychiatric: Normal judgment and insight. Alert and oriented x 3. Normal mood.    Impression and Plan:  No diagnosis found.  ***   There are no Patient Instructions on file for this visit.    Lelon Frohlich, MD [facility] at [facility]

## 2018-03-14 NOTE — Patient Instructions (Signed)
-  It was nice seeing you today!  -Glad you are doing better after your hospital stay.  -Please make sure you complete your course of antibiotics.  -Follow up with me as scheduled.

## 2018-03-29 DIAGNOSIS — H6121 Impacted cerumen, right ear: Secondary | ICD-10-CM | POA: Diagnosis not present

## 2018-03-29 DIAGNOSIS — H9209 Otalgia, unspecified ear: Secondary | ICD-10-CM | POA: Diagnosis not present

## 2018-03-29 DIAGNOSIS — R42 Dizziness and giddiness: Secondary | ICD-10-CM | POA: Diagnosis not present

## 2018-04-03 DIAGNOSIS — N3000 Acute cystitis without hematuria: Secondary | ICD-10-CM | POA: Diagnosis not present

## 2018-04-12 ENCOUNTER — Ambulatory Visit: Payer: Self-pay | Admitting: *Deleted

## 2018-04-12 NOTE — Telephone Encounter (Signed)
Will send to Nurse to make aware of appt

## 2018-04-12 NOTE — Telephone Encounter (Signed)
Patient's wife is calling to report patient was seen at [facility] for dizziness and had ear wax cleaned from ears- that did not help.Patient had been given Flavoxate at [facility] for bladder spasms- he stopped that to see if that would help- it has not helped. Patient states he feels the room is spinning when he goes from still position to movement.  Reason for Disposition . [1] MILD dizziness (e.g., walking normally) AND [2] has NOT been evaluated by physician for this  (Exception: dizziness caused by heat exposure, sudden standing, or poor fluid intake)  Answer Assessment - Initial Assessment Questions 1. DESCRIPTION: "Describe your dizziness."     Room seems to be going around- only occurs when moves from still position 2. LIGHTHEADED: "Do you feel lightheaded?" (e.g., somewhat faint, woozy, weak upon standing)     sometimes 3. VERTIGO: "Do you feel like either you or the room is spinning or tilting?" (i.e. vertigo)     spinning 4. SEVERITY: "How bad is it?"  "Do you feel like you are going to faint?" "Can you stand and walk?"   - MILD - walking normally   - MODERATE - interferes with normal activities (e.g., work, school)    - SEVERE - unable to stand, requires support to walk, feels like passing out now.      Mild- walks normally 5. ONSET:  "When did the dizziness begin?"     1 1/2 week 6. AGGRAVATING FACTORS: "Does anything make it worse?" (e.g., standing, change in head position)     Moving from sitting or standing 7. HEART RATE: "Can you tell me your heart rate?" "How many beats in 15 seconds?"  (Note: not all patients can do this)       BP and P- normal rate, T is normal 8. CAUSE: "What do you think is causing the dizziness?"     Patient was seen at [facility]- and ears were clogged with wax- they were cleaned- did not really help. 9. RECURRENT SYMPTOM: "Have you had dizziness before?" If so, ask: "When was the last time?" "What happened that time?"     no 10. OTHER SYMPTOMS: "Do you have any other  symptoms?" (e.g., fever, chest pain, vomiting, diarrhea, bleeding)       no 11. PREGNANCY: "Is there any chance you are pregnant?" "When was your last menstrual period?"       n/a  Protocols used: DIZZINESS [facility]

## 2018-04-12 NOTE — Telephone Encounter (Signed)
Will send to Dr. Ethlyn Gallery as [name]

## 2018-04-13 ENCOUNTER — Encounter (HOSPITAL_COMMUNITY): Payer: Self-pay | Admitting: Emergency Medicine

## 2018-04-13 ENCOUNTER — Ambulatory Visit (INDEPENDENT_AMBULATORY_CARE_PROVIDER_SITE_OTHER): Payer: Medicare Other | Admitting: Family Medicine

## 2018-04-13 ENCOUNTER — Encounter: Payer: Self-pay | Admitting: Family Medicine

## 2018-04-13 ENCOUNTER — Emergency Department (HOSPITAL_COMMUNITY)
Admission: EM | Admit: 2018-04-13 | Discharge: 2018-04-13 | Disposition: A | Discharge status: Home / Self Care | Payer: Medicare Other | Attending: Emergency Medicine | Admitting: Emergency Medicine

## 2018-04-13 VITALS — BP 140/60 | HR 60 | Wt 159.3 lb

## 2018-04-13 DIAGNOSIS — I5032 Chronic diastolic (congestive) heart failure: Secondary | ICD-10-CM | POA: Insufficient documentation

## 2018-04-13 DIAGNOSIS — R339 Retention of urine, unspecified: Secondary | ICD-10-CM | POA: Diagnosis not present

## 2018-04-13 DIAGNOSIS — I110 Hypertensive heart disease with heart failure: Secondary | ICD-10-CM | POA: Insufficient documentation

## 2018-04-13 DIAGNOSIS — Z79899 Other long term (current) drug therapy: Secondary | ICD-10-CM | POA: Insufficient documentation

## 2018-04-13 DIAGNOSIS — Z951 Presence of aortocoronary bypass graft: Secondary | ICD-10-CM | POA: Insufficient documentation

## 2018-04-13 DIAGNOSIS — R5383 Other fatigue: Secondary | ICD-10-CM | POA: Diagnosis not present

## 2018-04-13 DIAGNOSIS — Z7901 Long term (current) use of anticoagulants: Secondary | ICD-10-CM | POA: Insufficient documentation

## 2018-04-13 DIAGNOSIS — Z8546 Personal history of malignant neoplasm of prostate: Secondary | ICD-10-CM | POA: Diagnosis not present

## 2018-04-13 DIAGNOSIS — I252 Old myocardial infarction: Secondary | ICD-10-CM | POA: Diagnosis not present

## 2018-04-13 DIAGNOSIS — I2510 Atherosclerotic heart disease of native coronary artery without angina pectoris: Secondary | ICD-10-CM | POA: Diagnosis not present

## 2018-04-13 DIAGNOSIS — R42 Dizziness and giddiness: Secondary | ICD-10-CM | POA: Diagnosis not present

## 2018-04-13 DIAGNOSIS — N3000 Acute cystitis without hematuria: Secondary | ICD-10-CM | POA: Diagnosis not present

## 2018-04-13 DIAGNOSIS — Z87891 Personal history of nicotine dependence: Secondary | ICD-10-CM | POA: Insufficient documentation

## 2018-04-13 LAB — CBC WITH DIFFERENTIAL/PLATELET
Basophils Absolute: 0 10*3/uL (ref 0.0–0.1)
Basophils Relative: 0.2 % (ref 0.0–3.0)
Eosinophils Absolute: 0.2 10*3/uL (ref 0.0–0.7)
Eosinophils Relative: 2.2 % (ref 0.0–5.0)
HCT: 39.7 % (ref 39.0–52.0)
Hemoglobin: 13.2 g/dL (ref 13.0–17.0)
Lymphocytes Relative: 15.8 % (ref 12.0–46.0)
Lymphs Abs: 1.4 10*3/uL (ref 0.7–4.0)
MCHC: 33.2 g/dL (ref 30.0–36.0)
MCV: 89.5 fl (ref 78.0–100.0)
Monocytes Absolute: 1.1 10*3/uL — ABNORMAL HIGH (ref 0.1–1.0)
Monocytes Relative: 12.1 % — ABNORMAL HIGH (ref 3.0–12.0)
Neutro Abs: 6.3 10*3/uL (ref 1.4–7.7)
Neutrophils Relative %: 69.7 % (ref 43.0–77.0)
Platelets: 138 10*3/uL — ABNORMAL LOW (ref 150.0–400.0)
RBC: 4.43 Mil/uL (ref 4.22–5.81)
RDW: 15.3 % (ref 11.5–15.5)
WBC: 9.1 10*3/uL (ref 4.0–10.5)

## 2018-04-13 LAB — URINALYSIS, ROUTINE W REFLEX MICROSCOPIC
Bilirubin Urine: NEGATIVE
Glucose, UA: NEGATIVE mg/dL
Ketones, ur: NEGATIVE mg/dL
Nitrite: POSITIVE — AB
Protein, ur: 100 mg/dL — AB
RBC / HPF: >50 RBC/hpf — ABNORMAL HIGH (ref 0–5)
Specific Gravity, Urine: 1.016 (ref 1.005–1.030)
WBC, UA: >50 WBC/hpf — ABNORMAL HIGH (ref 0–5)
pH: 6 (ref 5.0–8.0)

## 2018-04-13 LAB — COMPREHENSIVE METABOLIC PANEL
ALT: 14 U/L (ref 0–53)
AST: 20 U/L (ref 0–37)
Albumin: 4.1 g/dL (ref 3.5–5.2)
Alkaline Phosphatase: 82 U/L (ref 39–117)
BUN: 24 mg/dL — ABNORMAL HIGH (ref 6–23)
CO2: 27 mEq/L (ref 19–32)
Calcium: 9.8 mg/dL (ref 8.4–10.5)
Chloride: 104 mEq/L (ref 96–112)
Creatinine, Ser: 1.14 mg/dL (ref 0.40–1.50)
GFR: 60.54 mL/min (ref >60.00)
Glucose, Bld: 100 mg/dL — ABNORMAL HIGH (ref 70–99)
Potassium: 4.8 mEq/L (ref 3.5–5.1)
Sodium: 140 mEq/L (ref 135–145)
Total Bilirubin: 0.7 mg/dL (ref 0.2–1.2)
Total Protein: 6.8 g/dL (ref 6.0–8.3)

## 2018-04-13 LAB — TSH: TSH: 4.96 u[IU]/mL — ABNORMAL HIGH (ref 0.35–4.50)

## 2018-04-13 MED ORDER — CIPROFLOXACIN HCL 500 MG PO TABS: 500.0000 mg | ORAL_TABLET | Freq: Two times a day (BID) | ORAL | 0 refills | Status: DC

## 2018-04-13 MED ORDER — CIPROFLOXACIN HCL 500 MG PO TABS
500.0000 mg | ORAL_TABLET | Freq: Once | ORAL | Status: AC
  Administered 2018-04-13: 500 mg via ORAL
  Filled 2018-04-13: qty 1

## 2018-04-13 MED ORDER — MECLIZINE HCL 12.5 MG PO TABS: 12.5000 mg | ORAL_TABLET | Freq: Three times a day (TID) | ORAL | 0 refills | Status: DC | PRN

## 2018-04-13 NOTE — Discharge Instructions (Addendum)
Follow-up with your urologist next week °

## 2018-04-13 NOTE — ED Provider Notes (Signed)
[facility] DEPT Provider Note   CSN: [id number] Arrival date & time: 04/13/18  1806     History   Chief Complaint Chief Complaint  Patient presents with  . Urinary Retention    HPI Ronald [name] is a 85 y.o. male.  Patient complains of Foley not draining.  Patient has suprapubic catheter  The history is provided by the patient. No language interpreter was used.  Illness  Location:  Suprapubic catheter Quality:  Will not drain Severity:  Moderate Onset quality:  Sudden Duration:  1 day Timing:  Constant Progression:  Unchanged Chronicity:  New Context:  Foley cath would not drain Relieved by:  Nothing Worsened by:  Nothing Ineffective treatments:  Irrigation of Foley Associated symptoms: abdominal pain   Associated symptoms: no chest pain, no congestion, no cough, no diarrhea, no fatigue, no headaches and no rash     Past Medical History:  Diagnosis Date  . Arthritis    "joints ache" (01/02/2014)  . At risk for sleep apnea    STOP-BANG= 4    SENT TO PCP 12-23-2013  . Bladder calculi   . CHF (congestive heart failure) ([place])   . Chronic cystitis   . Coronary artery disease CARDIOLOGIST-  DR Grace Bushy  MI -- S/P  11/89 CABG x6 (70% circ; 90% PD; 60-70% distal left main; 30% left circ; 90% 1st diag;, 2nd diag and 3rd diag. w/90%,  mild stenosis LAD and 60-70% pLAD)  Re-do CABG x5 in 1997  . Diverticulosis   . GERD (gastroesophageal reflux disease)   . History of Bell's palsy    RIGHT SIDE-- NO RESIDUAL  . Hx of dizziness   . Hyperlipidemia   . Hypertension    "not anymore" (01/02/2014)  . Ischemic cardiomyopathy    ef 35-40% per cath 08-28-2013  . Kidney stones "years ago"   "passed them"  . Melanoma of ear ([place])    "right"  . Mild dementia ([place])   . Myocardial infarction ([place]) 1986; 1997  . Nocturia   . OAB (overactive bladder)   . Prostate cancer ([place]) 1998   S/P  [place]; [place]  .  S/P CABG (coronary artery bypass graft)    [place]  . Skipped heart beats    occasional  . Urethral stricture   . UTI (urinary tract infection) 09/2015  . Wears hearing aid    bilateral  . Wears partial dentures     Patient Active Problem List   Diagnosis Date Noted  . UTI (urinary tract infection) 03/06/2018  . Chronic diastolic CHF (congestive heart failure) ([place]) 12/26/2017  . PAF (paroxysmal atrial fibrillation) ([place]) 12/26/2017  . Fever 05/11/2017  . AKI (acute kidney injury) ([place])   . CKD (chronic kidney disease), stage III ([place])   . Bacteremia due to Pseudomonas 01/21/2016  . Debility 01/21/2016  . History of Bell's palsy   . Gastroesophageal reflux disease without esophagitis   . Coronary artery disease involving coronary bypass graft of native heart without angina pectoris   . History of prostate cancer   . Suprapubic catheter ([place])   . Incontinence of feces   . Renal failure syndrome   . Bacteremia   . NSTEMI (non-ST elevated myocardial infarction) ([place]) 01/16/2016  . ARF (acute renal failure) ([place]) 10/13/2015  . Influenza A 12/05/2014  . Urinary tract infection associated with cystostomy catheter ([place]) 10/03/2014  . Acute lower UTI 09/10/2014  .  Bacteremia due to Enterococcus 01/22/2014  . Chronic systolic CHF (congestive heart failure) ([place]) 07-14-202015  . Urethral stricture 12/25/2013  . Bladder calculus 12/25/2013  . LV dysfunction 09/23/2013  . Unstable angina ([place]) 08/27/2013  . Bacteremia due to Escherichia coli 05/20/2012  . Urinary tract infection 09/15/2011  . Calculus of kidney 09/12/2011  . HYPERCHOLESTEROLEMIA 10/18/2007  . HTN (hypertension) 10/18/2007  . OSTEOARTHRITIS 02/07/2007  . BELL'S PALSY, RIGHT 10/26/2006  . Coronary atherosclerosis 10/26/2006  . PROSTATE CANCER, HX OF 10/26/2006  . NEPHROLITHIASIS, HX OF 10/26/2006    Past Surgical History:  Procedure Laterality Date  . CARDIAC CATHETERIZATION  02-22-2005  dr Vidal Schwalbe     mild to moderate lv dysfunction with inferobasilar akinesis/  totally occluded SVG to Intermediate Diagonal and SVG to PDA and RCA branches, totally occluded native coronary circulation with diffuse disease pLAD diagonal system with potentially could be ischemic/  patent SVG to OM with collaterals to dRCA and patent LIMA to LAD and diagonal systemss  . CARDIAC CATHETERIZATION  08-28-2013  DR Daneen Schick   widely patent sequential left internal graft to the diagonal/LAD, widely patent SVG to OM with proximal 50% narrowing noted in the graft, total occlusion SVG's to RCA, RI, and the Diagonal/ LV dysfunction with inferobasal aneurysm and mid anterior wall region of akinesis/  overall EF 35-40%/  Total occlusion of the navtive circulation/  no significant change compared to 2006 cath  . CARDIOVASCULAR STRESS TEST  08-01-2011  dr Angelena Form   inferior scar and possible soft tissue attenuation with minimal peri-infarct ischemia, small region of anterior ischemia and scar/  LVEF 53% LV wall motion with inferior hypokinesis/ no significant change from scan july 2011  . CATARACT EXTRACTION W/ INTRAOCULAR LENS  IMPLANT, BILATERAL Bilateral 2007  . CORONARY ARTERY BYPASS GRAFT  11/ [place]-- 6 vessel/  1997 Re-do 5 vessel  . CYSTOSCOPY WITH URETHRAL DILATATION N/A 12/25/2013   Procedure: CYSTOSCOPY WITH URETHRAL DILATATION, WITH BIOPSY;  Surgeon: Bernestine Amass, MD;  Location: [facility];  Service: Urology;  Laterality: N/A;  . CYSTOSCOPY WITH URETHRAL DILATATION N/A 12/22/2014   Procedure: CYSTOSCOPY WITH URETHRAL DILATATION;  Surgeon: Rana Snare, MD;  Location: [facility];  Service: Urology;  Laterality: N/A;  BALLOON DILATION CATHETER    . EUS  10/05/2011   Procedure: ESOPHAGEAL ENDOSCOPIC ULTRASOUND (EUS) RADIAL;  Surgeon: Arta Silence, MD;  Location: [facility];  Service: Endoscopy;  Laterality: N/A;  . INSERTION OF SUPRAPUBIC CATHETER N/A 12/22/2014   Procedure: INSERTION  OF SUPRAPUBIC CATHETER ;  Surgeon: Rana Snare, MD;  Location: [facility];  Service: Urology;  Laterality: N/A;  . LAPAROSCOPIC CHOLECYSTECTOMY  12-23-2005  . LEFT HEART CATHETERIZATION WITH CORONARY/GRAFT ANGIOGRAM N/A 08/28/2013   Procedure: LEFT HEART CATHETERIZATION WITH Beatrix Fetters;  Surgeon: Sinclair Grooms, MD;  Location: [facility];  Service: Cardiovascular;  Laterality: N/A;  . MELANOMA EXCISION  X 1   "ear"  . RADIOACTIVE PROSTATE SEED IMPLANTS  1998  . SKIN CANCER EXCISION  X 2   "top of head"  . TONSILLECTOMY AND ADENOIDECTOMY  1954        Home Medications    Prior to Admission medications   Medication Sig Start Date End Date Taking? Authorizing Provider  acetaminophen (TYLENOL) 500 MG tablet Take 500 mg by mouth every 6 (six) hours as needed for moderate pain or fever.    Yes [provider]  apixaban (ELIQUIS) 5 MG  TABS tablet Take 1 tablet (5 mg total) by mouth 2 (two) times daily. 05/10/17  Yes Burnell Blanks, MD  atorvastatin (LIPITOR) 80 MG tablet Take 0.5 tablets (40 mg total) by mouth every evening. 05/24/17  Yes Marletta Lor, MD  furosemide (LASIX) 20 MG tablet Take 1 tablet (20 mg total) by mouth daily as needed. For swelling Patient taking differently: Take 20 mg by mouth daily as needed for fluid or edema. For swelling 11/29/17  Yes Burnell Blanks, MD  hydroxypropyl methylcellulose (ISOPTO TEARS) 2.5 % ophthalmic solution Place 1 drop into both eyes 2 (two) times daily.    Yes [provider]  Multiple Vitamin (MULTIVITAMIN WITH MINERALS) TABS Take 1 tablet by mouth daily.   Yes [provider]  Polyethyl Glycol-Propyl Glycol (SYSTANE) 0.4-0.3 % SOLN Apply 1 drop to eye 3 (three) times daily.   Yes [provider]  ranitidine (ZANTAC) 150 MG tablet Take 1 tablet (150 mg total) by mouth 2 (two) times daily. 05/18/16  Yes Burnell Blanks, MD  Calcium Carbonate Antacid (TUMS PO) Take 3 tablets  by mouth daily as needed (gas).    [provider]  ciprofloxacin (CIPRO) 500 MG tablet Take 1 tablet (500 mg total) by mouth 2 (two) times daily. 04/13/18   Milton Ferguson, MD  flavoxATE (URISPAS) 100 MG tablet Take 100 mg by mouth 3 (three) times daily as needed for bladder spasms.    [provider]  meclizine (ANTIVERT) 12.5 MG tablet Take 1-2 tablets (12.5-25 mg total) by mouth 3 (three) times daily as needed for dizziness. 04/13/18   Caren Macadam, MD  nitroGLYCERIN (NITROSTAT) 0.4 MG SL tablet Place 0.4 mg under the tongue every 5 (five) minutes as needed for chest pain.     [provider]    Family History Family History  Problem Relation Age of Onset  . Heart disease Mother   . Diabetes Father   . Heart disease Brother     Social History Social History   Tobacco Use  . Smoking status: Former Smoker    Years: 40.00    Types: Cigars    Last attempt to quit: 12/24/1983    Years since quitting: 34.3  . Smokeless tobacco: Former Systems developer    Types: Chew    Quit date: 08/28/2013  . Tobacco comment: smoked a pipe  Substance Use Topics  . Alcohol use: No  . Drug use: No     Allergies   Pantoprazole; Nitrofurantoin; Sulfa antibiotics; Lidocaine; and Tape   Review of Systems Review of Systems  Constitutional: Negative for appetite change and fatigue.  HENT: Negative for congestion, ear discharge and sinus pressure.   Eyes: Negative for discharge.  Respiratory: Negative for cough.   Cardiovascular: Negative for chest pain.  Gastrointestinal: Positive for abdominal pain. Negative for diarrhea.  Genitourinary: Negative for frequency and hematuria.  Musculoskeletal: Negative for back pain.  Skin: Negative for rash.  Neurological: Negative for seizures and headaches.  Psychiatric/Behavioral: Negative for hallucinations.     Physical Exam Updated Vital Signs BP (!) 156/73 (BP Location: Left Arm)   Pulse 78   Temp (!) 97.4 F (36.3 C) (Oral)    Resp 18   SpO2 98%   Physical Exam Vitals signs and nursing note reviewed.  Constitutional:      Appearance: He is well-developed.  HENT:     Head: Normocephalic.     Nose: Nose normal.  Eyes:     General: No scleral  icterus.    Conjunctiva/sclera: Conjunctivae normal.  Neck:     Musculoskeletal: Neck supple.     Thyroid: No thyromegaly.  Cardiovascular:     Rate and Rhythm: Normal rate and regular rhythm.     Heart sounds: No murmur. No friction rub. No gallop.   Pulmonary:     Breath sounds: No stridor. No wheezing or rales.  Chest:     Chest wall: No tenderness.  Abdominal:     General: There is distension.     Tenderness: There is no abdominal tenderness. There is no rebound.  Musculoskeletal: Normal range of motion.  Lymphadenopathy:     Cervical: No cervical adenopathy.  Skin:    Findings: No erythema or rash.  Neurological:     Mental Status: He is oriented to person, place, and time.     Motor: No abnormal muscle tone.     Coordination: Coordination normal.  Psychiatric:        Behavior: Behavior normal.      ED Treatments / Results  Labs (all labs ordered are listed, but only abnormal results are displayed) Labs Reviewed  URINALYSIS, ROUTINE W REFLEX MICROSCOPIC - Abnormal; Notable for the following components:      Result Value   Color, Urine AMBER (*)    APPearance CLOUDY (*)    Hgb urine dipstick LARGE (*)    Protein, ur 100 (*)    Nitrite POSITIVE (*)    Leukocytes, UA LARGE (*)    RBC / HPF >50 (*)    WBC, UA >50 (*)    Bacteria, UA MANY (*)    All other components within normal limits  URINE CULTURE    EKG None  Radiology No results found.  Procedures Procedures (including critical care time)  Medications Ordered in ED Medications  ciprofloxacin (CIPRO) tablet 500 mg (has no administration in time range)     Initial Impression / Assessment and Plan / ED Course  I have reviewed the triage vital signs and the nursing  notes.  Pertinent labs & imaging results that were available during my care of the patient were reviewed by me and considered in my medical decision making (see chart for details).     Procedure:   Patient has suprapubic catheter that was clogged.  I removed the suprapubic catheter without problems.  And we placed a new suprapubic catheter.  It drained without any problems.  Urinalysis showed patient had urinary tract infection and he is started on Cipro and the urine is cultured he will follow-up with his urologist next week Final Clinical Impressions(s) / ED Diagnoses   Final diagnoses:  Urinary retention    ED Discharge Orders         Ordered    ciprofloxacin (CIPRO) 500 MG tablet  2 times daily     04/13/18 2010           Milton Ferguson, MD 04/13/18 2019

## 2018-04-13 NOTE — Patient Instructions (Addendum)
Please call cardiology to see about getting earlier follow up appointment.

## 2018-04-13 NOTE — Telephone Encounter (Signed)
Seen today in office

## 2018-04-13 NOTE — ED Triage Notes (Signed)
Pt was having urinary retention and seen at [facility] earlier today and had suprapubic cath flushed and seemed fine and was discharged home. Pt got home and had no urinary output and having severe lower abd pains. Pt noticed that his clothing is wet and unsure if has leakage.

## 2018-04-13 NOTE — [facility] Notes (Signed)
Ronald [name] DOB: [birth year] Encounter date: 04/13/2018  This is a 85 y.o. male who presents with Chief Complaint  Patient presents with  . Dizziness    finished antibiotic saturday, had ears cleaned out, no change in vertigo,     History of present illness:  Dizziness about 2.5 weeks. Not sure what triggered this. No illness or sinus symptoms.   Saw doc 2 weeks ago and they thought wax build up was problem.   Took him off of medication (flavoxate) because they read this could also cause dizziness, but he feels like things are actually worse since stopping this. Started for bladder spasms , but had only taking this for a couple of weeks before dizziness started. Medication was stopped a week ago.   Was given 3 days of keflex Jan 7th but this didn't help either. This was after seeing another provider at urology center for subtle infection around catheter.   Dizziness bothers him with going from sitting position to standing position. Room spinning when getting up from bed in morning. Noted this with lying down in bed as well. If he gets up and stays active, then symptoms do go away. He can even bend over and pick things up without feeling the dizziness.   This morning couldn't get stable enough to sit on bed.    Not had this type of dizziness before. No chest pain, chest pressure, heart racing.   Has appointment with cardiology 3/2.   Blood pressure stays low. Typically running around same as here in office.   Not taking lasix on daily basis. Just when needed.     Allergies  Allergen Reactions  . Pantoprazole Other (See Comments)    Headache and lightheaded  . Nitrofurantoin Hives  . Sulfa Antibiotics Hives and Itching  . Lidocaine Swelling    Mouth swelling   . Tape Other (See Comments)    TAPE WILL TEAR THE SKIN!!!   Current Meds  Medication Sig  . acetaminophen (TYLENOL) 500 MG tablet Take 500 mg by mouth every 6 (six) hours as needed for moderate pain or fever.    Marland Kitchen apixaban (ELIQUIS) 5 MG TABS tablet Take 1 tablet (5 mg total) by mouth 2 (two) times daily.  Marland Kitchen atorvastatin (LIPITOR) 80 MG tablet Take 0.5 tablets (40 mg total) by mouth every evening.  . Calcium Carbonate Antacid (TUMS PO) Take 3 tablets by mouth daily as needed (gas).  . flavoxATE (URISPAS) 100 MG tablet Take 100 mg by mouth 3 (three) times daily as needed for bladder spasms.  . furosemide (LASIX) 20 MG tablet Take 1 tablet (20 mg total) by mouth daily as needed. For swelling (Patient taking differently: Take 20 mg by mouth daily as needed for fluid or edema. For swelling)  . hydroxypropyl methylcellulose (ISOPTO TEARS) 2.5 % ophthalmic solution Place 1 drop into both eyes 2 (two) times daily.   . Multiple Vitamin (MULTIVITAMIN WITH MINERALS) TABS Take 1 tablet by mouth daily.  . nitroGLYCERIN (NITROSTAT) 0.4 MG SL tablet Place 0.4 mg under the tongue every 5 (five) minutes as needed for chest pain.   Vladimir Faster Glycol-Propyl Glycol (SYSTANE) 0.4-0.3 % SOLN Apply 1 drop to eye 3 (three) times daily.  . ranitidine (ZANTAC) 150 MG tablet Take 1 tablet (150 mg total) by mouth 2 (two) times daily.    Review of Systems  Constitutional: Negative for chills, fatigue and fever.  HENT: Negative for congestion, sinus pressure and sinus pain.   Respiratory: Negative for cough,  chest tightness, shortness of breath and wheezing.   Cardiovascular: Negative for chest pain, palpitations and leg swelling.  Neurological: Positive for dizziness (see HPI. this is associated with changes in position - lying to sitting/sitting to standing. Goes away with walking and once he pauses for a moment.). Negative for weakness (has some chronic lower extremity weakness) and numbness.    Objective:  BP 140/60 (BP Location: Left Arm, Patient Position: Prone, Cuff Size: Normal)   Pulse 60   Wt 159 lb 4.8 oz (72.3 kg)   SpO2 98%   BMI 26.51 kg/m   Weight: 159 lb 4.8 oz (72.3 kg)   BP Readings from Last 3  Encounters:  04/13/18 (!) 156/73  04/13/18 140/60  03/14/18 140/80   Wt Readings from Last 3 Encounters:  04/13/18 159 lb 4.8 oz (72.3 kg)  03/14/18 154 lb 8 oz (70.1 kg)  03/07/18 155 lb 8 oz (70.5 kg)    Physical Exam Constitutional:      General: He is not in acute distress.    Appearance: He is well-developed. He is not diaphoretic.  HENT:     Head: Normocephalic and atraumatic.     Right Ear: External ear normal.     Left Ear: External ear normal.  Eyes:     Conjunctiva/sclera: Conjunctivae normal.     Pupils: Pupils are equal, round, and reactive to light.  Neck:     Musculoskeletal: Neck supple.     Thyroid: No thyromegaly.  Cardiovascular:     Rate and Rhythm: Normal rate and regular rhythm.     Heart sounds: Normal heart sounds. No murmur. No friction rub. No gallop.   Pulmonary:     Effort: Pulmonary effort is normal. No respiratory distress.     Breath sounds: Normal breath sounds. No wheezing or rales.  Lymphadenopathy:     Cervical: No cervical adenopathy.  Skin:    General: Skin is warm and dry.  Neurological:     Mental Status: He is alert and oriented to person, place, and time.     Cranial Nerves: No cranial nerve deficit.     Motor: No tremor, abnormal muscle tone or pronator drift.     Coordination: Romberg sign negative.     Gait: Gait normal.     Deep Tendon Reflexes: Reflexes normal.     Reflex Scores:      Tricep reflexes are 2+ on the right side and 2+ on the left side.      Bicep reflexes are 2+ on the right side and 2+ on the left side.      Brachioradialis reflexes are 2+ on the right side and 2+ on the left side.      Patellar reflexes are 2+ on the right side and 2+ on the left side.    Comments: Gait is slightly wider based, slow, but this is his baseline (per him/wife) for years. No balance difficulties with walking. Dizziness was not reproduced in the office today.  Psychiatric:        Behavior: Behavior normal.      Assessment/Plan 1. Dizziness We will get some basic bloodwork. Follow up pending results. Of note, patient was in ER prior to note completion and dx with UTI which may have also been contributing to symptoms. I did also recommend calling to get earlier follow up with cardiology (appointment was in march). Advised taking time with position changes for fall prevention. Was not obviously orthostatic in office today, but a fib may  be contributing to symptoms due to poor compensation with changes in position. Trial of meclizine and will follow up by phone next week.  - TSH; Future - CBC with Differential/Platelet; Future - Comprehensive metabolic panel; Future - meclizine (ANTIVERT) 12.5 MG tablet; Take 1-2 tablets (12.5-25 mg total) by mouth 3 (three) times daily as needed for dizziness.  Dispense: 30 tablet; Refill: 0 - Comprehensive metabolic panel - CBC with Differential/Platelet - TSH  2. Fatigue, unspecified type - TSH; Future - TSH   Return pending bloodwork.    Micheline Rough, MD

## 2018-04-16 LAB — URINE CULTURE: Culture: >=100000 — AB

## 2018-04-17 ENCOUNTER — Telehealth: Payer: Self-pay

## 2018-04-17 NOTE — Telephone Encounter (Signed)
Post ED Visit - Positive Culture Follow-up  Culture report reviewed by antimicrobial stewardship pharmacist:  []  Elenor Quinones, Pharm.D. []  Heide Guile, Pharm.D., BCPS AQ-ID []  Parks Neptune, Pharm.D., BCPS []  Alycia Rossetti, Pharm.D., BCPS []  [place], Pharm.D., BCPS, AAHIVP []  Legrand Como, Pharm.D., BCPS, AAHIVP [x]  Salome Arnt, PharmD, BCPS []  Johnnette Gourd, PharmD, BCPS []  Hughes Better, PharmD, BCPS []  Leeroy Cha, PharmD  Positive urine culture Treated with Cephalexin, organism sensitive to the same and no further patient follow-up is required at this time.  Genia Del 04/17/2018, 8:02 AM

## 2018-04-18 ENCOUNTER — Telehealth: Payer: Self-pay | Admitting: *Deleted

## 2018-04-18 DIAGNOSIS — R7989 Other specified abnormal findings of blood chemistry: Secondary | ICD-10-CM

## 2018-04-18 NOTE — Telephone Encounter (Signed)
Copied from Kevin [id number]. Topic: General - Inquiry >> Apr 18, 2018  2:08 PM Conception Chancy, NT wrote: Reason for CRM: patient wife is calling and states the nurse called him yesterday and he told her that the nurse wants him to come back in 2-3 weeks for more blood work. Patient wife is unsure if the patient heard correctly and would like to confirm.

## 2018-04-19 NOTE — Telephone Encounter (Signed)
Spoke with wife and lab appointment made.

## 2018-05-06 ENCOUNTER — Emergency Department (HOSPITAL_COMMUNITY)
Admission: EM | Admit: 2018-05-06 | Discharge: 2018-05-06 | Discharge status: Absent without leave | Payer: Medicare Other

## 2018-05-08 ENCOUNTER — Emergency Department (HOSPITAL_COMMUNITY)
Admission: EM | Admit: 2018-05-08 | Discharge: 2018-05-08 | Disposition: A | Discharge status: Home / Self Care | Payer: Medicare Other | Attending: Emergency Medicine | Admitting: Emergency Medicine

## 2018-05-08 ENCOUNTER — Encounter (HOSPITAL_COMMUNITY): Payer: Self-pay | Admitting: Emergency Medicine

## 2018-05-08 DIAGNOSIS — I130 Hypertensive heart and chronic kidney disease with heart failure and stage 1 through stage 4 chronic kidney disease, or unspecified chronic kidney disease: Secondary | ICD-10-CM | POA: Insufficient documentation

## 2018-05-08 DIAGNOSIS — Z79899 Other long term (current) drug therapy: Secondary | ICD-10-CM | POA: Insufficient documentation

## 2018-05-08 DIAGNOSIS — T83091A Other mechanical complication of indwelling urethral catheter, initial encounter: Secondary | ICD-10-CM | POA: Diagnosis not present

## 2018-05-08 DIAGNOSIS — I5032 Chronic diastolic (congestive) heart failure: Secondary | ICD-10-CM | POA: Insufficient documentation

## 2018-05-08 DIAGNOSIS — N183 Chronic kidney disease, stage 3 (moderate): Secondary | ICD-10-CM | POA: Insufficient documentation

## 2018-05-08 DIAGNOSIS — Z7901 Long term (current) use of anticoagulants: Secondary | ICD-10-CM | POA: Insufficient documentation

## 2018-05-08 DIAGNOSIS — N3281 Overactive bladder: Secondary | ICD-10-CM | POA: Diagnosis not present

## 2018-05-08 DIAGNOSIS — Z87891 Personal history of nicotine dependence: Secondary | ICD-10-CM | POA: Diagnosis not present

## 2018-05-08 DIAGNOSIS — R338 Other retention of urine: Secondary | ICD-10-CM

## 2018-05-08 DIAGNOSIS — I252 Old myocardial infarction: Secondary | ICD-10-CM | POA: Diagnosis not present

## 2018-05-08 DIAGNOSIS — T839XXA Unspecified complication of genitourinary prosthetic device, implant and graft, initial encounter: Secondary | ICD-10-CM

## 2018-05-08 DIAGNOSIS — T83098A Other mechanical complication of other indwelling urethral catheter, initial encounter: Secondary | ICD-10-CM | POA: Diagnosis not present

## 2018-05-08 DIAGNOSIS — Y733 Surgical instruments, materials and gastroenterology and urology devices (including sutures) associated with adverse incidents: Secondary | ICD-10-CM | POA: Diagnosis not present

## 2018-05-08 DIAGNOSIS — Z8546 Personal history of malignant neoplasm of prostate: Secondary | ICD-10-CM | POA: Diagnosis not present

## 2018-05-08 DIAGNOSIS — R339 Retention of urine, unspecified: Secondary | ICD-10-CM | POA: Diagnosis not present

## 2018-05-08 NOTE — ED Notes (Signed)
Catheter flushed with NS, draining normally after flush, some noted hematuria in output.

## 2018-05-08 NOTE — Discharge Instructions (Addendum)
You were seen in the emergency department for a blocked Foley catheter.  We irrigated it here and is now seems to be working better.  Please contact your urologist Dr. Lovena Neighbours to see if he would allow you to do irrigation at home if it becomes obstructed.  Return if any problems.

## 2018-05-08 NOTE — ED Provider Notes (Signed)
[place] DEPT Provider Note   CSN: [id number] Arrival date & time: 05/08/18  0707     History   Chief Complaint Chief Complaint  Patient presents with  . clogged catheter    HPI Ronald [name] is a 85 y.o. male.  He presents emergency department complaining of difficulty with his suprapubic catheter.  The symptoms of been intermittent but most recently since last night.  It is causing him some bladder spasms and there is some urine leaking from his abdominal wall.  He has had this problem in the past and usually responds to flushing the catheter or changing it.  The catheter last was changed about 3 weeks ago and he just had it [facility]ated [facility] a few days ago.  No fevers.  The history is provided by the patient and the spouse.  Abdominal Pain  Pain location:  Suprapubic Pain quality: stabbing   Pain radiates to:  Does not radiate Pain severity:  Moderate Onset quality:  Sudden Timing:  Sporadic Progression:  Unchanged Chronicity:  Recurrent Relieved by:  Nothing Worsened by:  Nothing Ineffective treatments:  None tried Associated symptoms: hematuria (chronic, intermittent)   Associated symptoms: no chest pain, no chills, no cough, no dysuria, no fever, no nausea, no shortness of breath, no sore throat and no vomiting     Past Medical History:  Diagnosis Date  . Arthritis    "joints ache" (01/02/2014)  . At risk for sleep apnea    STOP-BANG= 4    SENT TO PCP 12-23-2013  . Bladder calculi   . CHF (congestive heart failure) ([place])   . Chronic cystitis   . Coronary artery disease CARDIOLOGIST-  DR Grace Bushy  MI -- S/P  11/89 CABG x6 (70% circ; 90% PD; 60-70% distal left main; 30% left circ; 90% 1st diag;, 2nd diag and 3rd diag. w/90%,  mild stenosis LAD and 60-70% pLAD)  Re-do CABG x5 in 1997  . Diverticulosis   . GERD (gastroesophageal reflux disease)   . History of Bell's palsy    RIGHT SIDE-- NO RESIDUAL  .  Hx of dizziness   . Hyperlipidemia   . Hypertension    "not anymore" (01/02/2014)  . Ischemic cardiomyopathy    ef 35-40% per cath 08-28-2013  . Kidney stones "years ago"   "passed them"  . Melanoma of ear ([employer])    "right"  . Mild dementia ([place])   . Myocardial infarction ([place]) 1986; 1997  . Nocturia   . OAB (overactive bladder)   . Prostate cancer ([place]) 1998   S/P  [place]; [place]  . S/P CABG (coronary artery bypass graft)    [name]  . Skipped heart beats    occasional  . Urethral stricture   . UTI (urinary tract infection) 09/2015  . Wears hearing aid    bilateral  . Wears partial dentures     Patient Active Problem List   Diagnosis Date Noted  . UTI (urinary tract infection) 03/06/2018  . Chronic diastolic CHF (congestive heart failure) ([place]) 12/26/2017  . PAF (paroxysmal atrial fibrillation) ([place]) 12/26/2017  . Fever 05/11/2017  . AKI (acute kidney injury) ([place])   . CKD (chronic kidney disease), stage III ([place])   . Bacteremia due to Pseudomonas 01/21/2016  . Debility 01/21/2016  . History of Bell's palsy   . Gastroesophageal reflux disease without esophagitis   . Coronary artery  disease involving coronary bypass graft of native heart without angina pectoris   . History of prostate cancer   . Suprapubic catheter ([place])   . Incontinence of feces   . Renal failure syndrome   . Bacteremia   . NSTEMI (non-ST elevated myocardial infarction) ([place]) 01/16/2016  . ARF (acute renal failure) ([place]) 10/13/2015  . Influenza A 12/05/2014  . Urinary tract infection associated with cystostomy catheter ([place]) 10/03/2014  . Acute lower UTI 09/10/2014  . Bacteremia due to Enterococcus 01/22/2014  . Chronic systolic CHF (congestive heart failure) ([place]) August 28, 202015  . Urethral stricture 12/25/2013  . Bladder calculus 12/25/2013  . LV dysfunction 09/23/2013  . Unstable angina ([place]) 08/27/2013  . Bacteremia due to Escherichia coli 05/20/2012    . Urinary tract infection 09/15/2011  . Calculus of kidney 09/12/2011  . HYPERCHOLESTEROLEMIA 10/18/2007  . HTN (hypertension) 10/18/2007  . OSTEOARTHRITIS 02/07/2007  . BELL'S PALSY, RIGHT 10/26/2006  . Coronary atherosclerosis 10/26/2006  . PROSTATE CANCER, HX OF 10/26/2006  . NEPHROLITHIASIS, HX OF 10/26/2006    Past Surgical History:  Procedure Laterality Date  . CARDIAC CATHETERIZATION  02-22-2005  dr Vidal Schwalbe   mild to moderate lv dysfunction with inferobasilar akinesis/  totally occluded SVG to Intermediate Diagonal and SVG to PDA and RCA branches, totally occluded native coronary circulation with diffuse disease pLAD diagonal system with potentially could be ischemic/  patent SVG to OM with collaterals to dRCA and patent LIMA to LAD and diagonal systemss  . CARDIAC CATHETERIZATION  08-28-2013  DR Daneen Schick   widely patent sequential left internal graft to the diagonal/LAD, widely patent SVG to OM with proximal 50% narrowing noted in the graft, total occlusion SVG's to RCA, RI, and the Diagonal/ LV dysfunction with inferobasal aneurysm and mid anterior wall region of akinesis/  overall EF 35-40%/  Total occlusion of the navtive circulation/  no significant change compared to 2006 cath  . CARDIOVASCULAR STRESS TEST  08-01-2011  dr Angelena Form   inferior scar and possible soft tissue attenuation with minimal peri-infarct ischemia, small region of anterior ischemia and scar/  LVEF 53% LV wall motion with inferior hypokinesis/ no significant change from scan july 2011  . CATARACT EXTRACTION W/ INTRAOCULAR LENS  IMPLANT, BILATERAL Bilateral 2007  . CORONARY ARTERY BYPASS GRAFT  11/ [place]-- 6 vessel/  1997 Re-do 5 vessel  . CYSTOSCOPY WITH URETHRAL DILATATION N/A 12/25/2013   Procedure: CYSTOSCOPY WITH URETHRAL DILATATION, WITH BIOPSY;  Surgeon: Bernestine Amass, MD;  Location: [facility];  Service: Urology;  Laterality: N/A;  . CYSTOSCOPY WITH URETHRAL  DILATATION N/A 12/22/2014   Procedure: CYSTOSCOPY WITH URETHRAL DILATATION;  Surgeon: Rana Snare, MD;  Location: [facility];  Service: Urology;  Laterality: N/A;  BALLOON DILATION CATHETER    . EUS  10/05/2011   Procedure: ESOPHAGEAL ENDOSCOPIC ULTRASOUND (EUS) RADIAL;  Surgeon: Arta Silence, MD;  Location: [facility];  Service: Endoscopy;  Laterality: N/A;  . INSERTION OF SUPRAPUBIC CATHETER N/A 12/22/2014   Procedure: INSERTION OF SUPRAPUBIC CATHETER ;  Surgeon: Rana Snare, MD;  Location: [facility];  Service: Urology;  Laterality: N/A;  . LAPAROSCOPIC CHOLECYSTECTOMY  12-23-2005  . LEFT HEART CATHETERIZATION WITH CORONARY/GRAFT ANGIOGRAM N/A 08/28/2013   Procedure: LEFT HEART CATHETERIZATION WITH Beatrix Fetters;  Surgeon: Sinclair Grooms, MD;  Location: [facility] CATH LAB;  Service: Cardiovascular;  Laterality: N/A;  . MELANOMA EXCISION  X 1   "ear"  . RADIOACTIVE PROSTATE SEED IMPLANTS  1998  . SKIN CANCER EXCISION  X 2   "top of head"  . TONSILLECTOMY AND ADENOIDECTOMY  1954        Home Medications    Prior to Admission medications   Medication Sig Start Date End Date Taking? Authorizing Provider  acetaminophen (TYLENOL) 500 MG tablet Take 500 mg by mouth every 6 (six) hours as needed for moderate pain or fever.     [provider]  apixaban (ELIQUIS) 5 MG TABS tablet Take 1 tablet (5 mg total) by mouth 2 (two) times daily. 05/10/17   Burnell Blanks, MD  atorvastatin (LIPITOR) 80 MG tablet Take 0.5 tablets (40 mg total) by mouth every evening. 05/24/17   Marletta Lor, MD  Calcium Carbonate Antacid (TUMS PO) Take 3 tablets by mouth daily as needed (gas).    [provider]  ciprofloxacin (CIPRO) 500 MG tablet Take 1 tablet (500 mg total) by mouth 2 (two) times daily. 04/13/18   Milton Ferguson, MD  flavoxATE (URISPAS) 100 MG tablet Take 100 mg by mouth 3 (three) times daily as needed for bladder spasms.    [provider]  furosemide (LASIX)  20 MG tablet Take 1 tablet (20 mg total) by mouth daily as needed. For swelling Patient taking differently: Take 20 mg by mouth daily as needed for fluid or edema. For swelling 11/29/17   Burnell Blanks, MD  hydroxypropyl methylcellulose (ISOPTO TEARS) 2.5 % ophthalmic solution Place 1 drop into both eyes 2 (two) times daily.     [provider]  meclizine (ANTIVERT) 12.5 MG tablet Take 1-2 tablets (12.5-25 mg total) by mouth 3 (three) times daily as needed for dizziness. 04/13/18   Caren Macadam, MD  Multiple Vitamin (MULTIVITAMIN WITH MINERALS) TABS Take 1 tablet by mouth daily.    [provider]  nitroGLYCERIN (NITROSTAT) 0.4 MG SL tablet Place 0.4 mg under the tongue every 5 (five) minutes as needed for chest pain.     [provider]  Polyethyl Glycol-Propyl Glycol (SYSTANE) 0.4-0.3 % SOLN Apply 1 drop to eye 3 (three) times daily.    [provider]  ranitidine (ZANTAC) 150 MG tablet Take 1 tablet (150 mg total) by mouth 2 (two) times daily. 05/18/16   Burnell Blanks, MD    Family History Family History  Problem Relation Age of Onset  . Heart disease Mother   . Diabetes Father   . Heart disease Brother     Social History Social History   Tobacco Use  . Smoking status: Former Smoker    Years: 40.00    Types: Cigars    Last attempt to quit: 12/24/1983    Years since quitting: 34.3  . Smokeless tobacco: Former Systems developer    Types: Chew    Quit date: 08/28/2013  . Tobacco comment: smoked a pipe  Substance Use Topics  . Alcohol use: No  . Drug use: No     Allergies   Pantoprazole; Nitrofurantoin; Sulfa antibiotics; Lidocaine; and Tape   Review of Systems Review of Systems  Constitutional: Negative for chills and fever.  HENT: Negative for sore throat.   Eyes: Negative for visual disturbance.  Respiratory: Negative for cough and shortness of breath.   Cardiovascular: Negative for chest pain.  Gastrointestinal: Positive  for abdominal pain. Negative for nausea and vomiting.  Genitourinary: Positive for hematuria (chronic, intermittent). Negative for dysuria.  Musculoskeletal: Negative for neck pain.  Skin: Negative for rash.  Neurological: Negative for headaches.  Physical Exam Updated Vital Signs BP (!) 134/92 (BP Location: Right Arm)   Pulse 78   Temp 97.7 F (36.5 C) (Oral)   Resp 14   Ht 5\' 5"  (1.651 m)   Wt 74.4 kg   SpO2 99%   BMI 27.29 kg/m   Physical Exam Vitals signs and nursing note reviewed.  Constitutional:      Appearance: He is well-developed.  HENT:     Head: Normocephalic and atraumatic.  Eyes:     Conjunctiva/sclera: Conjunctivae normal.  Neck:     Musculoskeletal: Neck supple.  Cardiovascular:     Rate and Rhythm: Normal rate.  Pulmonary:     Effort: Pulmonary effort is normal.  Abdominal:     Palpations: Abdomen is soft.     Tenderness: There is no abdominal tenderness. There is no guarding.     Comments: Suprapubic catheter with stoma location lower abdomen.  No surrounding erythema.  Musculoskeletal:        General: No deformity or signs of injury.  Skin:    General: Skin is warm and dry.     Capillary Refill: Capillary refill takes less than 2 seconds.  Neurological:     General: No focal deficit present.     Mental Status: He is alert and oriented to person, place, and time. Mental status is at baseline.     GCS: GCS eye subscore is 4. GCS verbal subscore is 5. GCS motor subscore is 6.      ED Treatments / Results  Labs (all labs ordered are listed, but only abnormal results are displayed) Labs Reviewed  URINE CULTURE    EKG None  Radiology No results found.  Procedures Procedures (including critical care time)  Medications Ordered in ED Medications - No data to display   Initial Impression / Assessment and Plan / ED Course  I have reviewed the triage vital signs and the nursing notes.  Pertinent labs & imaging results that were  available during my care of the patient were reviewed by me and considered in my medical decision making (see chart for details).  Clinical Course as of May 09 1739  Tue May 08, 2018  0814 I irrigated the catheter with some sterile saline.  It seems to be draining better now.  Will bladder scan.    [MB]  [id number] Reevaluated the patient and his urine seems to be draining easily.  He has no abdominal pain.  He is comfortable going home and I asked that he check with his urologist Dr. Lovena Neighbours regarding potentially allowing them to flush the catheter at home if it seems obstructed   [MB]    Clinical Course User Index [MB] Hayden Rasmussen, MD     Final Clinical Impressions(s) / ED Diagnoses   Final diagnoses:  Foley catheter problem, initial encounter [facility])  Acute urinary retention    ED Discharge Orders    None       Hayden Rasmussen, MD 05/08/18 1742

## 2018-05-08 NOTE — ED Triage Notes (Signed)
Pt reports that having urinary leakage around the catheter and nothing into the bag.

## 2018-05-10 DIAGNOSIS — N3021 Other chronic cystitis with hematuria: Secondary | ICD-10-CM | POA: Diagnosis not present

## 2018-05-10 DIAGNOSIS — R310 Gross hematuria: Secondary | ICD-10-CM | POA: Diagnosis not present

## 2018-05-10 DIAGNOSIS — Z8546 Personal history of malignant neoplasm of prostate: Secondary | ICD-10-CM | POA: Diagnosis not present

## 2018-05-10 DIAGNOSIS — N35812 Other urethral bulbous stricture, male: Secondary | ICD-10-CM | POA: Diagnosis not present

## 2018-05-10 LAB — URINE CULTURE
Culture: >=100000 — AB
Special Requests: NORMAL

## 2018-05-11 ENCOUNTER — Telehealth: Payer: Self-pay | Admitting: *Deleted

## 2018-05-11 NOTE — Telephone Encounter (Signed)
Post ED Visit - Positive Culture Follow-up  Culture report reviewed by antimicrobial stewardship pharmacist:  []  Elenor Quinones, Pharm.D. []  Heide Guile, Pharm.D., BCPS AQ-ID []  Parks Neptune, Pharm.D., BCPS []  Alycia Rossetti, Pharm.D., BCPS []  [place], Pharm.D., BCPS, AAHIVP []  Legrand Como, Pharm.D., BCPS, AAHIVP []  Salome Arnt, PharmD, BCPS []  Johnnette Gourd, PharmD, BCPS []  Hughes Better, PharmD, BCPS []  Leeroy Cha, PharmD Carmon Sails, PA  Positive urine culture Suprapubic catheter irrigated in ED, draining improved.  F/U with urologist and no further patient follow-up is required at this time.  Harlon Flor [facility] 05/11/2018, 12:07 PM

## 2018-05-23 ENCOUNTER — Telehealth: Payer: Self-pay | Admitting: Internal Medicine

## 2018-05-23 DIAGNOSIS — R7989 Other specified abnormal findings of blood chemistry: Secondary | ICD-10-CM

## 2018-05-23 DIAGNOSIS — E0590 Thyrotoxicosis, unspecified without thyrotoxic crisis or storm: Secondary | ICD-10-CM

## 2018-05-23 NOTE — Telephone Encounter (Signed)
If the order is in there it should work right?

## 2018-05-23 NOTE — Telephone Encounter (Signed)
Copied from [place] [id number]. Topic: General - Inquiry >> May 23, 2018 12:12 PM Sheran Luz wrote: Reason for CRM: Patient's wife, [name], calling to inquire if order for thyroid can be put in for patient so he can have it done at appointment with cardiology on 3/9. Alvina Chou that TSH was in but she specifically wanted to know if [facility] cardiology would be able to access and if it needed to be placed by Dr. Jerilee Hoh. Please advise.

## 2018-05-24 NOTE — Telephone Encounter (Signed)
New order for "lab collect" has been placed and Ronald [name] is aware.

## 2018-05-28 ENCOUNTER — Ambulatory Visit: Payer: Medicare Other | Admitting: Physician Assistant

## 2018-05-30 ENCOUNTER — Encounter: Payer: Self-pay | Admitting: Physician Assistant

## 2018-05-30 NOTE — Progress Notes (Signed)
Cardiology Office Note    Date:  06/04/2018  ID:  Ronald [name], DOB 1927/10/02, MRN [id number] PCP:  Isaac Bliss, [name], MD  Cardiologist:  Lauree Chandler, MD   Chief Complaint: 6 month f/u CAD, AF, CHF  History of Present Illness:  Ronald [name] is a 85 y.o. male with history of CAD s/p CABG in 1989 and redo bypass in 3546, chronic systolic CHF with improved EF in 2018, persistent atrial fibrillation, bradycardia, CKD stage II-III per labs, HTN, HLD, mild dementia, prostate cancer, GERD, diverticulosis, Bell's palsy, urinary retention who presents for cardiac follow-up. His last cath was in 2015 patent LIMA-diag/LAD, patent SVG-OM with proximal 50% narrowing in graft, total occlusion of saphenous vein grafts to the right coronary, the ramus intermedius, and the diagonal, severe native disease - anatomy was not significant changed from 2006 and medical therapy was recommended. His EF was 35-40% by that cath, with last echo in 01/2017 showing septal hypokinesis, moderate basal septal hypertrophy, EF 56-81%, normal diastolic function, mild LAE. He has had several medical encounters for urosepsis with known history of kidney stones and urethral stricture as well as urinary retention. In November 2018 he was found to have atrial fibrillation and was started on Eliquis. Aspirin was stopped. Event monitor 02/2017 showed sinus bradycardia and PAF. At last OV 11/2017 he was in atrial fib and rhythm control was not pursued. He has not previously tolerated beta blockers due to bradycardia, Imdur due to hypotension, or ACEI (unclear reaction, noted in Dr. Camillia Herter note). Last labs 03/2018 showed K 4.8, Cr 1.14, Hgb 13.2, plt 138k, TSH 4.96 (followed by PCP), 04/2017 LDL 76.  He returns for follow-up today with his wife of over 71 yrs and daughter. His main complaint is that he has been dizzy for 3 months nearly every single day. Yesterday it lasted all day long, associated with some SOB, not  clearly orthostatic in nature or made worse by anything in particular. No associated palpitations. He seems to be getting weaker with each UTI. He did PT after last hospitalization then graduated from it. He continues to have DOE and intermittent LEE. Also reports legs burn at night. No claudication. He has to walk with a cane. He stumbled backward today and fell back into a chair this morning while getting up but no specific trauma noted. Denies any other cardiac symptoms or CP.   Orthostatics done showed: Lying HR 59, BP 140/72 Sitting HR 62, BP 136/72 Standing 28m HR 76, BP 130/68 (dizziness) Standing 44m, HR 73, BP 124/66   Past Medical History:  Diagnosis Date  . Arthritis    "joints ache" (01/02/2014)  . At risk for sleep apnea    STOP-BANG= 4    SENT TO PCP 12-23-2013  . Bladder calculi   . Bradycardia   . Chronic cystitis   . Chronic systolic CHF (congestive heart failure) (HCC)    a. EF improved to 50-55% in 2018, previously 35-40%  . CKD (chronic kidney disease), stage II   . Coronary artery disease CARDIOLOGIST-  DR Angelena Form   a. CAD s/p CABG in 1989. b. redo bypass in 1997. c. last cath 2015 - med rx.  . Diverticulosis   . GERD (gastroesophageal reflux disease)   . History of Bell's palsy    RIGHT SIDE-- NO RESIDUAL  . Hx of dizziness   . Hyperlipidemia   . Hypertension    "not anymore" (01/02/2014)  . Ischemic cardiomyopathy   . Kidney stones "years  ago"   "passed them"  . Melanoma of ear ([place])    "right"  . Mild dementia ([place])   . Myocardial infarction ([place]) 1986; 1997  . Nocturia   . OAB (overactive bladder)   . Persistent atrial fibrillation   . Prostate cancer ([place]) 1998   S/P  [place]; [place]  . S/P CABG (coronary artery bypass graft)    [place]  . Sepsis due to urinary tract infection ([place])   . Urethral stricture   . UTI (urinary tract infection) 09/2015  . Wears hearing aid    bilateral  . Wears partial  dentures     Past Surgical History:  Procedure Laterality Date  . CARDIAC CATHETERIZATION  02-22-2005  dr Vidal Schwalbe   mild to moderate lv dysfunction with inferobasilar akinesis/  totally occluded SVG to Intermediate Diagonal and SVG to PDA and RCA branches, totally occluded native coronary circulation with diffuse disease pLAD diagonal system with potentially could be ischemic/  patent SVG to OM with collaterals to dRCA and patent LIMA to LAD and diagonal systemss  . CARDIAC CATHETERIZATION  08-28-2013  DR Daneen Schick   widely patent sequential left internal graft to the diagonal/LAD, widely patent SVG to OM with proximal 50% narrowing noted in the graft, total occlusion SVG's to RCA, RI, and the Diagonal/ LV dysfunction with inferobasal aneurysm and mid anterior wall region of akinesis/  overall EF 35-40%/  Total occlusion of the navtive circulation/  no significant change compared to 2006 cath  . CARDIOVASCULAR STRESS TEST  08-01-2011  dr Angelena Form   inferior scar and possible soft tissue attenuation with minimal peri-infarct ischemia, small region of anterior ischemia and scar/  LVEF 53% LV wall motion with inferior hypokinesis/ no significant change from scan july 2011  . CATARACT EXTRACTION W/ INTRAOCULAR LENS  IMPLANT, BILATERAL Bilateral 2007  . CORONARY ARTERY BYPASS GRAFT  11/ [place]-- 6 vessel/  1997 Re-do 5 vessel  . CYSTOSCOPY WITH URETHRAL DILATATION N/A 12/25/2013   Procedure: CYSTOSCOPY WITH URETHRAL DILATATION, WITH BIOPSY;  Surgeon: Bernestine Amass, MD;  Location[facility];  Service: Urology;  Laterality: N/A;  . CYSTOSCOPY WITH URETHRAL DILATATION N/A 12/22/2014   Procedure: CYSTOSCOPY WITH URETHRAL DILATATION;  Surgeon: Rana Snare, MD;  Location: [facility];  Service: Urology;  Laterality: N/A;  BALLOON DILATION CATHETER    . EUS  10/05/2011   Procedure: ESOPHAGEAL ENDOSCOPIC ULTRASOUND (EUS) RADIAL;  Surgeon: Arta Silence, MD;  Location: [facility]  ENDOSCOPY;  Service: Endoscopy;  Laterality: N/A;  . INSERTION OF SUPRAPUBIC CATHETER N/A 12/22/2014   Procedure: INSERTION OF SUPRAPUBIC CATHETER ;  Surgeon: Rana Snare, MD;  Location: [facility];  Service: Urology;  Laterality: N/A;  . LAPAROSCOPIC CHOLECYSTECTOMY  12-23-2005  . LEFT HEART CATHETERIZATION WITH CORONARY/GRAFT ANGIOGRAM N/A 08/28/2013   Procedure: LEFT HEART CATHETERIZATION WITH Beatrix Fetters;  Surgeon: Sinclair Grooms, MD;  Location: [facility];  Service: Cardiovascular;  Laterality: N/A;  . MELANOMA EXCISION  X 1   "ear"  . RADIOACTIVE PROSTATE SEED IMPLANTS  1998  . SKIN CANCER EXCISION  X 2   "top of head"  . TONSILLECTOMY AND ADENOIDECTOMY  1954    Current Medications: Current Meds  Medication Sig  . acetaminophen (TYLENOL) 500 MG tablet Take 500 mg by mouth every 6 (six) hours as needed for moderate pain or fever.   Marland Kitchen apixaban (ELIQUIS)  5 MG TABS tablet Take 1 tablet (5 mg total) by mouth 2 (two) times daily.  Marland Kitchen atorvastatin (LIPITOR) 80 MG tablet Take 0.5 tablets (40 mg total) by mouth every evening.  . Calcium Carbonate Antacid (TUMS PO) Take 3 tablets by mouth daily as needed (gas).  . cephALEXin (KEFLEX) 250 MG capsule Take 1 capsule by mouth daily.  . flavoxATE (URISPAS) 100 MG tablet Take 100 mg by mouth 3 (three) times daily as needed for bladder spasms.  . furosemide (LASIX) 20 MG tablet Take 20 mg by mouth daily as needed.  . hydroxypropyl methylcellulose (ISOPTO TEARS) 2.5 % ophthalmic solution Place 1 drop into both eyes 2 (two) times daily.   . meclizine (ANTIVERT) 12.5 MG tablet Take 1-2 tablets (12.5-25 mg total) by mouth 3 (three) times daily as needed for dizziness.  . Multiple Vitamin (MULTIVITAMIN WITH MINERALS) TABS Take 1 tablet by mouth daily.  . nitroGLYCERIN (NITROSTAT) 0.4 MG SL tablet Place 0.4 mg under the tongue every 5 (five) minutes as needed for chest pain.   Vladimir Faster Glycol-Propyl Glycol (SYSTANE) 0.4-0.3 % SOLN Apply 1  drop to eye 3 (three) times daily.  . ranitidine (ZANTAC) 150 MG tablet Take 1 tablet (150 mg total) by mouth 2 (two) times daily.      Allergies:   Pantoprazole; Nitrofurantoin; Sulfa antibiotics; Lidocaine; and Tape   Social History   Socioeconomic History  . Marital status: Married    Spouse name: Not on file  . Number of children: Not on file  . Years of education: Not on file  . Highest education level: Not on file  Occupational History  . Not on file  Social Needs  . Financial resource strain: Not on file  . Food insecurity:    Worry: Not on file    Inability: Not on file  . Transportation needs:    Medical: Not on file    Non-medical: Not on file  Tobacco Use  . Smoking status: Former Smoker    Years: 40.00    Types: Cigars    Last attempt to quit: 12/24/1983    Years since quitting: 34.4  . Smokeless tobacco: Former Systems developer    Types: Chew    Quit date: 08/28/2013  . Tobacco comment: smoked a pipe  Substance and Sexual Activity  . Alcohol use: No  . Drug use: No  . Sexual activity: Not on file  Lifestyle  . Physical activity:    Days per week: Not on file    Minutes per session: Not on file  . Stress: Not on file  Relationships  . Social connections:    Talks on phone: Not on file    Gets together: Not on file    Attends religious service: Not on file    Active member of club or organization: Not on file    Attends meetings of clubs or organizations: Not on file    Relationship status: Not on file  Other Topics Concern  . Not on file  Social History Narrative   Was a Clinical cytogeneticist   Was in the [employer] x 4 years   Father died and felt he needed to come home to help his mother    Was asked to rejoin      2 children; boy and girl   3 grand children   One grandson in [place], one grandson in [place];    [place] a job as Nurse, adult dtr; [place].  Family History:  The patient's family history includes Diabetes in his father; Heart  disease in his brother and mother.  ROS:   Please see the history of present illness.   All other systems are reviewed and otherwise negative.    PHYSICAL EXAM:   VS:  BP 126/76   Pulse 63   Ht 5\' 5"  (1.651 m)   Wt 160 lb 12.8 oz (72.9 kg)   SpO2 99%   BMI 26.76 kg/m   BMI: Body mass index is 26.76 kg/m. GEN: Well nourished, well developed WM, in no acute distress HEENT: normocephalic, atraumatic Neck: no JVD, carotid bruits, or masses Cardiac: RRR; no murmurs, rubs, or gallops, no edema  Respiratory:  clear to auscultation bilaterally, normal work of breathing GI: soft, nontender, nondistended, + BS MS: no deformity or atrophy Skin: warm and dry, no rash Neuro:  Alert and Oriented x 3, Strength and sensation are intact, follows commands Psych: euthymic mood, full affect  Wt Readings from Last 3 Encounters:  06/04/18 160 lb 12.8 oz (72.9 kg)  05/08/18 164 lb (74.4 kg)  04/13/18 159 lb 4.8 oz (72.3 kg)      Studies/Labs Reviewed:   EKG:  EKG was ordered today and personally reviewed by me and demonstrates NSR 65bpm first degree AVB, nonspecific ST-T Changes, voltage criteria for LVH, no change from prior. Fu tracing did show frequent PVCs in pattern of bigeminy briefly but subsequent tracing showed no ectopy.   Recent Labs: 12/26/2017: B Natriuretic Peptide 280.6 04/13/2018: ALT 14; BUN 24; Creatinine, Ser 1.14; Hemoglobin 13.2; Platelets 138.0; Potassium 4.8; Sodium 140; TSH 4.96   Lipid Panel    Component Value Date/Time   CHOL 134 05/10/2017 0741   TRIG 57 05/10/2017 0741   HDL 47 05/10/2017 0741   CHOLHDL 2.9 05/10/2017 0741   CHOLHDL 2.6 07/23/2015 0755   VLDL 10 07/23/2015 0755   LDLCALC 76 05/10/2017 0741    Additional studies/ records that were reviewed today include: Summarized above.    ASSESSMENT & PLAN:   1. Dizziness - etiology unclear, could be multifactorial. He is in NSR on first EKG and had a few PVCs on f/u tracing. Has h/o AF and  bradycardia. Will place 48 hour Holter monitor and check carotid duplex. He doesn't really want to do a longer monitor. Can consider echo if these are unrevealing although BP and HR are normal today. His orthostatics do show an elevation in HR with standing but not a significant drop in BP. Will update BMET, CBC. Per family, PCP also requested he get updated thyroid lab while he is here as well so will obtain TSH with free T4. He also reports some burning of his legs at night so will check Mg, B12 and folate for neuropathy. If cardiac workup unrevealing would suggest consideration of neuro referral.  2. CAD - has chronic DOE. Original bypass grafts are over 47 years old, and over 56 years old from f/u surgery . Give comorbidities would not be a great candidate to pursue further invasive testing - with dizziness and recent fall, thinking ahead if he were to require PCI he would not be a great candidate for multiple blood thinners on board than he is already on. Continue medical therapy which has been limited by above issues. Check lipids. 3. Chronic systolic CHF with improved LVEF - overall appears euvolemic. He is taking Lasix sporadically for edema. Also discussed conservative measures like elevation and compression hose especially given hx of dizziness and previous  low BP mentioned in chart. 4. Persistent atrial fib, with history of bradycardia - see above regarding event monitor. He is in NSR with occasional PVCs on second tracing. Eliquis dose remains appropriate by criteria. Need to continue to monitor candidacy for ongoing anticoagulation along the way given his dizziness and age. 5. Hyperlipidemia - check lipids with labs today.  Disposition: F/u with Dr. McAlhany/care team APP in 4 weeks. Will cc to Dr. Angelena Form to keep him in the loop.  Medication Adjustments/Labs and Tests Ordered: Current medicines are reviewed at length with the patient today.  Concerns regarding medicines are outlined above.  Medication changes, Labs and Tests ordered today are summarized above and listed in the Patient Instructions accessible in Encounters.   Signed, Charlie Pitter, PA-C  06/04/2018 10:44 AM    [facility] [place], [place], [place]  [address] Phone: [phone]; Fax: [phone]

## 2018-06-04 ENCOUNTER — Encounter: Payer: Self-pay | Admitting: Physician Assistant

## 2018-06-04 ENCOUNTER — Ambulatory Visit (INDEPENDENT_AMBULATORY_CARE_PROVIDER_SITE_OTHER): Payer: Medicare Other | Admitting: Physician Assistant

## 2018-06-04 VITALS — BP 126/76 | HR 63 | Ht 65.0 in | Wt 160.8 lb

## 2018-06-04 DIAGNOSIS — I5022 Chronic systolic (congestive) heart failure: Secondary | ICD-10-CM | POA: Diagnosis not present

## 2018-06-04 DIAGNOSIS — R42 Dizziness and giddiness: Secondary | ICD-10-CM | POA: Diagnosis not present

## 2018-06-04 DIAGNOSIS — G9009 Other idiopathic peripheral autonomic neuropathy: Secondary | ICD-10-CM | POA: Diagnosis not present

## 2018-06-04 DIAGNOSIS — D649 Anemia, unspecified: Secondary | ICD-10-CM | POA: Diagnosis not present

## 2018-06-04 DIAGNOSIS — I2510 Atherosclerotic heart disease of native coronary artery without angina pectoris: Secondary | ICD-10-CM

## 2018-06-04 DIAGNOSIS — E785 Hyperlipidemia, unspecified: Secondary | ICD-10-CM | POA: Diagnosis not present

## 2018-06-04 DIAGNOSIS — I4819 Other persistent atrial fibrillation: Secondary | ICD-10-CM | POA: Diagnosis not present

## 2018-06-04 LAB — CBC
Hematocrit: 38.4 % (ref 37.5–51.0)
Hemoglobin: 12.8 g/dL — ABNORMAL LOW (ref 13.0–17.7)
MCH: 29.2 pg (ref 26.6–33.0)
MCHC: 33.3 g/dL (ref 31.5–35.7)
MCV: 88 fL (ref 79–97)
Platelets: 141 10*3/uL — ABNORMAL LOW (ref 150–450)
RBC: 4.39 x10E6/uL (ref 4.14–5.80)
RDW: 13.8 % (ref 11.6–15.4)
WBC: 6.3 10*3/uL (ref 3.4–10.8)

## 2018-06-04 LAB — LIPID PANEL
Chol/HDL Ratio: 2.8 ratio (ref 0.0–5.0)
Cholesterol, Total: 133 mg/dL (ref 100–199)
HDL: 48 mg/dL (ref >39)
LDL Calculated: 72 mg/dL (ref 0–99)
Triglycerides: 64 mg/dL (ref 0–149)
VLDL Cholesterol Cal: 13 mg/dL (ref 5–40)

## 2018-06-04 LAB — BASIC METABOLIC PANEL
BUN/Creatinine Ratio: 16 (ref 10–24)
BUN: 23 mg/dL (ref 8–27)
CO2: 23 mmol/L (ref 20–29)
Calcium: 9.7 mg/dL (ref 8.6–10.2)
Chloride: 104 mmol/L (ref 96–106)
Creatinine, Ser: 1.41 mg/dL — ABNORMAL HIGH (ref 0.76–1.27)
GFR calc Af Amer: 51 mL/min/{1.73_m2} — ABNORMAL LOW (ref >59)
GFR calc non Af Amer: 44 mL/min/{1.73_m2} — ABNORMAL LOW (ref >59)
Glucose: 93 mg/dL (ref 65–99)
Potassium: 4.6 mmol/L (ref 3.5–5.2)
Sodium: 143 mmol/L (ref 134–144)

## 2018-06-04 LAB — MAGNESIUM: Magnesium: 2.3 mg/dL (ref 1.6–2.3)

## 2018-06-04 LAB — T4, FREE: Free T4: 1.25 ng/dL (ref 0.82–1.77)

## 2018-06-04 LAB — B12 AND FOLATE PANEL
Folate: >20 ng/mL (ref >3.0)
Vitamin B-12: 875 pg/mL (ref 232–1245)

## 2018-06-04 LAB — TSH: TSH: 6.49 u[IU]/mL — ABNORMAL HIGH (ref 0.450–4.500)

## 2018-06-04 NOTE — Patient Instructions (Addendum)
Medication Instructions:  Your physician recommends that you continue on your current medications as directed. Please refer to the Current Medication list given to you today.  If you need a refill on your cardiac medications before your next appointment, please call your pharmacy.   Lab work: TODAY:  TSH, FREE T4, CBC, BMET, MAG, B12 LEVEL - FOLATE LEVEL, & LIPID   If you have labs (blood work) drawn today and your tests are completely normal, you will receive your results only by: Marland Kitchen MyChart Message (if you have MyChart) OR . A paper copy in the mail If you have any lab test that is abnormal or we need to change your treatment, we will call you to review the results.  Testing/Procedures: Your physician has recommended that you wear an event monitor. Event monitors are medical devices that record the heart's electrical activity. Doctors most often [place] these monitors to diagnose arrhythmias. Arrhythmias are problems with the speed or rhythm of the heartbeat. The monitor is a small, portable device. You can wear one while you do your normal daily activities. This is usually used to diagnose what is causing palpitations/syncope (passing out).  Your physician has requested that you have a carotid duplex. This test is an ultrasound of the carotid arteries in your neck. It looks at blood flow through these arteries that supply the brain with blood. Allow one hour for this exam. There are no restrictions or special instructions.    Follow-Up: Your physician recommends that you schedule a follow-up appointment in: [place], OR HIS CARE TEAM    Any Other Special Instructions Will Be Listed Below (If Applicable).  Ambulatory Cardiac Monitoring An ambulatory cardiac monitor is a small recording device that is used to detect abnormal heart rhythms (arrhythmias). Most monitors are connected by wires to flat, sticky disks (electrodes) that are then attached to your chest. You may need to  wear a monitor if you have had symptoms such as:  Fast heartbeats (palpitations).  Dizziness.  Fainting or light-headedness.  Unexplained weakness.  Shortness of breath. There are several types of monitors. Some common monitors include:  Holter monitor. This records your heart rhythm continuously, usually for 24-48 hours.  Event (episodic) monitor. This monitor has a symptoms button, and when pushed, it will begin recording. You need to activate this monitor to record when you have a heart-related symptom.  Automatic detection monitor. This monitor will begin recording when it detects an abnormal heartbeat. What are the risks? Generally, these devices are safe to use. However, it is possible that the skin under the electrodes will become irritated. How to prepare for monitoring Your health care provider will prepare your chest for the electrode placement and show you how to use the monitor.  Do not apply lotions to your chest before monitoring.  Follow directions on how to care for the monitor, and how to return the monitor when the testing period is complete. How to use your cardiac monitor  Follow directions about how long to wear the monitor, and if you can take the monitor off in order to shower or bathe. ? Do not let the monitor get wet. ? Do not bathe, swim, or use a hot tub while wearing the monitor.  Keep your skin clean. Do not put body lotion or moisturizer on your chest.  Change the electrodes as told by your health care provider, or any time they stop sticking to your skin. You may need to use medical tape  to keep them on.  Try to put the electrodes in slightly different places on your chest to help prevent skin irritation. Follow directions from your health care provider about where to place the electrodes.  Make sure the monitor is safely clipped to your clothing or in a location close to your body as recommended by your health care provider.  If your monitor has  a symptoms button, press the button to mark an event as soon as you feel a heart-related symptom, such as: ? Dizziness. ? Weakness. ? Light-headedness. ? Palpitations. ? Thumping or pounding in your chest. ? Shortness of breath. ? Unexplained weakness.  Keep a diary of your activities, such as walking, doing chores, and taking medicine. It is very important to note what you were doing when you pushed the button to record your symptoms. This will help your health care provider determine what might be contributing to your symptoms.  Send the recorded information as recommended by your health care provider. It may take some time for your health care provider to process the results.  Change the batteries as told by your health care provider.  Keep electronic devices away from your monitor. These include: ? Tablets. ? MP3 players. ? Cell phones.  While wearing your monitor you should avoid: ? Electric blankets. ? Armed forces operational officer. ? Electric toothbrushes. ? Microwave ovens. ? Magnets. ? Metal detectors. Get help right away if:  You have chest pain.  You have shortness of breath or extreme difficulty breathing.  You develop a very fast heartbeat that does not get better.  You develop dizziness that does not go away.  You faint or constantly feel like you are about to faint. Summary  An ambulatory cardiac monitor is a small recording device that is used to detect abnormal heart rhythms (arrhythmias).  Make sure you understand how to send the information from the monitor to your health care provider.  It is important to press the button on the monitor when you have any heart-related symptoms.  Keep a diary of your activities, such as walking, doing chores, and taking medicine. It is very important to note what you were doing when you pushed the button to record your symptoms. This will help your health care provider learn what might be causing your symptoms. This information is  not intended to replace advice given to you by your health care provider. Make sure you discuss any questions you have with your health care provider. Document Released: 12/22/2007 Document Revised: 12/28/2016 Document Reviewed: 02/27/2016 Elsevier Interactive Patient Education  2019 [employer].

## 2018-06-07 ENCOUNTER — Telehealth: Payer: Self-pay

## 2018-06-07 DIAGNOSIS — N3021 Other chronic cystitis with hematuria: Secondary | ICD-10-CM | POA: Diagnosis not present

## 2018-06-07 DIAGNOSIS — N390 Urinary tract infection, site not specified: Secondary | ICD-10-CM | POA: Diagnosis not present

## 2018-06-07 DIAGNOSIS — Z8546 Personal history of malignant neoplasm of prostate: Secondary | ICD-10-CM | POA: Diagnosis not present

## 2018-06-07 NOTE — Telephone Encounter (Signed)
Copied from [place] [id number]. Topic: General - Other >> Jun 07, 2018 11:19 AM Leward Quan A wrote: Reason for CRM: Patient wife called to get information on the patients Thyroid stated that his results were not normal and need to know when Dr Jerilee Hoh will be starting him on some type of medication for this issue.

## 2018-06-08 ENCOUNTER — Encounter: Payer: Self-pay | Admitting: Internal Medicine

## 2018-06-08 NOTE — Telephone Encounter (Signed)
Spoke with wife and an follow up appointment made per Dr Jerilee Hoh.

## 2018-06-08 NOTE — Telephone Encounter (Signed)
Pt's wife calling back.  She is concerned about his results and getting him on medication.

## 2018-06-11 ENCOUNTER — Encounter: Payer: Self-pay | Admitting: Internal Medicine

## 2018-06-11 ENCOUNTER — Other Ambulatory Visit: Payer: Self-pay

## 2018-06-11 ENCOUNTER — Ambulatory Visit (INDEPENDENT_AMBULATORY_CARE_PROVIDER_SITE_OTHER): Payer: Medicare Other | Admitting: Internal Medicine

## 2018-06-11 VITALS — Ht 65.0 in | Wt 157.6 lb

## 2018-06-11 DIAGNOSIS — R42 Dizziness and giddiness: Secondary | ICD-10-CM

## 2018-06-11 DIAGNOSIS — E038 Other specified hypothyroidism: Secondary | ICD-10-CM

## 2018-06-11 DIAGNOSIS — R7989 Other specified abnormal findings of blood chemistry: Secondary | ICD-10-CM | POA: Diagnosis not present

## 2018-06-11 DIAGNOSIS — I2510 Atherosclerotic heart disease of native coronary artery without angina pectoris: Secondary | ICD-10-CM | POA: Diagnosis not present

## 2018-06-11 DIAGNOSIS — E039 Hypothyroidism, unspecified: Secondary | ICD-10-CM

## 2018-06-11 MED ORDER — MECLIZINE HCL 12.5 MG PO TABS: 12.5000 mg | ORAL_TABLET | Freq: Three times a day (TID) | ORAL | 0 refills | Status: DC | PRN

## 2018-06-11 NOTE — Progress Notes (Signed)
Established Patient Office Visit     CC/Reason for Visit: Follow-up thyroid levels  HPI: Ronald [name] is a 85 y.o. male who is coming in today for the above mentioned reasons. Past Medical History is significant for: Coronary artery disease status post CABG, stable by cath in 2015, hyperlipidemia controlled on statin, well-controlled hypertension, history of atrial fibrillation anticoagulated on Eliquis which has been rate controlled, also has a history of ischemic cardiomyopathy with difficulty tolerating ACE inhibitors and beta-blockers due to hypotension.  He also has a history of a suprapubic catheter.  Has had several hospital admissions this year for catheter exchange, sepsis due to urinary source with indwelling suprapubic catheter.  He is here today to follow-up on an elevated TSH that was noted during a visit with cardiology.  He was found to have an elevated TSH with a normal T4.  He continues to have dizziness.  He was seen in this office by another provider in January for this issue.  That same day he was hospitalized for recurrent UTIs and it was thought that that might have played a role in his dizziness.  He also has difficulty tolerating blood pressure medication due to hypotension although he has never been frankly orthostatic.  He was prescribed meclizine at one point that did help and he is wondering if I can refill that for him today.  I also note that since I last saw him his urologist has placed him on daily low-dose Keflex 250 mg hopefully to prevent recurrent UTIs.   Past Medical/Surgical History: Past Medical History:  Diagnosis Date  . Arthritis    "joints ache" (01/02/2014)  . At risk for sleep apnea    STOP-BANG= 4    SENT TO PCP 12-23-2013  . Bladder calculi   . Bradycardia   . Chronic cystitis   . Chronic systolic CHF (congestive heart failure) (HCC)    a. EF improved to 50-55% in 2018, previously 35-40%  . CKD (chronic kidney disease), stage II   .  Coronary artery disease CARDIOLOGIST-  DR Angelena Form   a. CAD s/p CABG in 1989. b. redo bypass in 1997. c. last cath 2015 - med rx.  . Diverticulosis   . GERD (gastroesophageal reflux disease)   . History of Bell's palsy    RIGHT SIDE-- NO RESIDUAL  . Hx of dizziness   . Hyperlipidemia   . Hypertension    "not anymore" (01/02/2014)  . Ischemic cardiomyopathy   . Kidney stones "years ago"   "passed them"  . Melanoma of ear ([place])    "right"  . Mild dementia ([place])   . Myocardial infarction ([place]) 1986; 1997  . Nocturia   . OAB (overactive bladder)   . Persistent atrial fibrillation   . Prostate cancer ([place]) 1998   S/P  [place]; [place]  . S/P CABG (coronary artery bypass graft)    [place]  . Sepsis due to urinary tract infection ([place])   . Urethral stricture   . UTI (urinary tract infection) 09/2015  . Wears hearing aid    bilateral  . Wears partial dentures     Past Surgical History:  Procedure Laterality Date  . CARDIAC CATHETERIZATION  02-22-2005  dr Vidal Schwalbe   mild to moderate lv dysfunction with inferobasilar akinesis/  totally occluded SVG to Intermediate Diagonal and SVG to PDA and RCA branches, totally occluded native coronary circulation with diffuse disease pLAD diagonal  system with potentially could be ischemic/  patent SVG to OM with collaterals to dRCA and patent LIMA to LAD and diagonal systemss  . CARDIAC CATHETERIZATION  08-28-2013  DR Daneen Schick   widely patent sequential left internal graft to the diagonal/LAD, widely patent SVG to OM with proximal 50% narrowing noted in the graft, total occlusion SVG's to RCA, RI, and the Diagonal/ LV dysfunction with inferobasal aneurysm and mid anterior wall region of akinesis/  overall EF 35-40%/  Total occlusion of the navtive circulation/  no significant change compared to 2006 cath  . CARDIOVASCULAR STRESS TEST  08-01-2011  dr Angelena Form   inferior scar and possible soft tissue  attenuation with minimal peri-infarct ischemia, small region of anterior ischemia and scar/  LVEF 53% LV wall motion with inferior hypokinesis/ no significant change from scan july 2011  . CATARACT EXTRACTION W/ INTRAOCULAR LENS  IMPLANT, BILATERAL Bilateral 2007  . CORONARY ARTERY BYPASS GRAFT  11/ [place]-- 6 vessel/  1997 Re-do 5 vessel  . CYSTOSCOPY WITH URETHRAL DILATATION N/A 12/25/2013   Procedure: CYSTOSCOPY WITH URETHRAL DILATATION, WITH BIOPSY;  Surgeon: Bernestine Amass, MD;  Location: [facility];  Service: Urology;  Laterality: N/A;  . CYSTOSCOPY WITH URETHRAL DILATATION N/A 12/22/2014   Procedure: CYSTOSCOPY WITH URETHRAL DILATATION;  Surgeon: Rana Snare, MD;  Location: [facility];  Service: Urology;  Laterality: N/A;  BALLOON DILATION CATHETER    . EUS  10/05/2011   Procedure: ESOPHAGEAL ENDOSCOPIC ULTRASOUND (EUS) RADIAL;  Surgeon: Arta Silence, MD;  Location: [facility];  Service: Endoscopy;  Laterality: N/A;  . INSERTION OF SUPRAPUBIC CATHETER N/A 12/22/2014   Procedure: INSERTION OF SUPRAPUBIC CATHETER ;  Surgeon: Rana Snare, MD;  Location: [facility];  Service: Urology;  Laterality: N/A;  . LAPAROSCOPIC CHOLECYSTECTOMY  12-23-2005  . LEFT HEART CATHETERIZATION WITH CORONARY/GRAFT ANGIOGRAM N/A 08/28/2013   Procedure: LEFT HEART CATHETERIZATION WITH Beatrix Fetters;  Surgeon: Sinclair Grooms, MD;  Location[facility];  Service: Cardiovascular;  Laterality: N/A;  . MELANOMA EXCISION  X 1   "ear"  . RADIOACTIVE PROSTATE SEED IMPLANTS  1998  . SKIN CANCER EXCISION  X 2   "top of head"  . TONSILLECTOMY AND ADENOIDECTOMY  1954    Social History:  reports that he quit smoking about 34 years ago. His smoking use included cigars. He quit after 40.00 years of use. He quit smokeless tobacco use about 4 years ago.  His smokeless tobacco use included chew. He reports that he does not drink alcohol or use drugs.  Allergies: Allergies  Allergen  Reactions  . Pantoprazole Other (See Comments)    Headache and lightheaded  . Nitrofurantoin Hives  . Sulfa Antibiotics Hives and Itching  . Lidocaine Swelling    Mouth swelling   . Tape Other (See Comments)    TAPE WILL TEAR THE SKIN!!!    Family History:  Family History  Problem Relation Age of Onset  . Heart disease Mother   . Diabetes Father   . Heart disease Brother      Current Outpatient Medications:  .  acetaminophen (TYLENOL) 500 MG tablet, Take 500 mg by mouth every 6 (six) hours as needed for moderate pain or fever. , Disp: , Rfl:  .  apixaban (ELIQUIS) 5 MG TABS tablet, Take 1 tablet (5 mg total) by mouth 2 (two) times daily., Disp: 60 tablet, Rfl: 11 .  atorvastatin (LIPITOR) 80 MG tablet, Take 0.5  tablets (40 mg total) by mouth every evening., Disp: , Rfl:  .  Calcium Carbonate Antacid (TUMS PO), Take 3 tablets by mouth daily as needed (gas)., Disp: , Rfl:  .  cephALEXin (KEFLEX) 250 MG capsule, Take 1 capsule by mouth daily., Disp: , Rfl:  .  flavoxATE (URISPAS) 100 MG tablet, Take 100 mg by mouth 3 (three) times daily as needed for bladder spasms., Disp: , Rfl:  .  furosemide (LASIX) 20 MG tablet, Take 20 mg by mouth daily as needed., Disp: , Rfl:  .  hydroxypropyl methylcellulose (ISOPTO TEARS) 2.5 % ophthalmic solution, Place 1 drop into both eyes 2 (two) times daily. , Disp: , Rfl:  .  meclizine (ANTIVERT) 12.5 MG tablet, Take 1 tablet (12.5 mg total) by mouth 3 (three) times daily as needed for dizziness., Disp: 90 tablet, Rfl: 0 .  Multiple Vitamin (MULTIVITAMIN WITH MINERALS) TABS, Take 1 tablet by mouth daily., Disp: , Rfl:  .  nitroGLYCERIN (NITROSTAT) 0.4 MG SL tablet, Place 0.4 mg under the tongue every 5 (five) minutes as needed for chest pain. , Disp: , Rfl:  .  Polyethyl Glycol-Propyl Glycol (SYSTANE) 0.4-0.3 % SOLN, Apply 1 drop to eye 3 (three) times daily., Disp: , Rfl:  .  ranitidine (ZANTAC) 150 MG tablet, Take 1 tablet (150 mg total) by mouth 2  (two) times daily., Disp: , Rfl:   Review of Systems:  Constitutional: Denies fever, chills, diaphoresis, appetite change and fatigue.  HEENT: Denies photophobia, eye pain, redness, hearing loss, ear pain, congestion, sore throat, rhinorrhea, sneezing, mouth sores, trouble swallowing, neck pain, neck stiffness and tinnitus.   Respiratory: Denies SOB, DOE, cough, chest tightness,  and wheezing.   Cardiovascular: Denies chest pain, palpitations and leg swelling.  Gastrointestinal: Denies nausea, vomiting, abdominal pain, diarrhea, constipation, blood in stool and abdominal distention.  Genitourinary: Denies dysuria, urgency, frequency, hematuria, flank pain and difficulty urinating.  Endocrine: Denies: hot or cold intolerance, sweats, changes in hair or nails, polyuria, polydipsia. Musculoskeletal: Denies myalgias, back pain, joint swelling, arthralgias and gait problem.  Skin: Denies pallor, rash and wound.  Neurological: Denies  seizures, syncope, weakness, light-headedness, numbness and headaches.  Hematological: Denies adenopathy. Easy bruising, personal or family bleeding history  Psychiatric/Behavioral: Denies suicidal ideation, mood changes, confusion, nervousness, sleep disturbance and agitation    Physical Exam: Vitals:   06/11/18 1320  SpO2: 96%  Weight: 157 lb 9.6 oz (71.5 kg)  Height: 5\' 5"  (1.651 m)    Body mass index is 26.23 kg/m.   Constitutional: NAD, calm, comfortable Eyes: PERRL, lids and conjunctivae normal ENMT: Mucous membranes are moist.  Respiratory: clear to auscultation bilaterally, no wheezing, no crackles. Normal respiratory effort. No accessory muscle use.  Cardiovascular: Regular rate and rhythm, no murmurs / rubs / gallops. No extremity edema. 2+ pedal pulses. No carotid bruits.  Musculoskeletal: no clubbing / cyanosis. No joint deformity upper and lower extremities. Good ROM, no contractures. Normal muscle tone.  Psychiatric: Normal judgment and  insight. Alert and oriented x 3. Normal mood.    Impression and Plan:  Elevated TSH -Suspect this might be subclinical hypothyroidism given his multiple episodes of critical illness with UTI and sepsis requiring hospitalizations over the past few months. -For now will elect to follow conservatively, will return in 8 weeks for TSH, free T3 and free T4 at follow-up.  Dizziness  -Etiology remains unclear, he tells me that his cardiologist has set him up for an event monitor to be placed later this month. -  In the meantime he states that meclizine seems to help, will refill that prescription today. -He is not orthostatic in the office today.    Patient Instructions  -Nice seeing you today!!  -Take meclizine as needed for dizziness.  -Schedule follow up in 3 months.  -Lab visit only in 8 weeks.     Ronald Frohlich, MD [facility]

## 2018-06-11 NOTE — Patient Instructions (Signed)
-  Nice seeing you today!!  -Take meclizine as needed for dizziness.  -Schedule follow up in 3 months.  -Lab visit only in 8 weeks.

## 2018-06-12 NOTE — Telephone Encounter (Signed)
Patient seen in office 06/11/2018

## 2018-06-18 ENCOUNTER — Encounter (HOSPITAL_COMMUNITY): Payer: Medicare Other

## 2018-06-29 ENCOUNTER — Ambulatory Visit: Payer: Medicare Other | Admitting: Internal Medicine

## 2018-07-01 ENCOUNTER — Other Ambulatory Visit: Payer: Self-pay

## 2018-07-01 ENCOUNTER — Encounter (HOSPITAL_COMMUNITY): Payer: Self-pay | Admitting: Emergency Medicine

## 2018-07-01 ENCOUNTER — Emergency Department (HOSPITAL_COMMUNITY): Payer: Medicare Other

## 2018-07-01 ENCOUNTER — Emergency Department (HOSPITAL_COMMUNITY)
Admission: EM | Admit: 2018-07-01 | Discharge: 2018-07-02 | Disposition: A | Discharge status: Home / Self Care | Payer: Medicare Other | Attending: Emergency Medicine | Admitting: Emergency Medicine

## 2018-07-01 DIAGNOSIS — Z951 Presence of aortocoronary bypass graft: Secondary | ICD-10-CM | POA: Insufficient documentation

## 2018-07-01 DIAGNOSIS — R0902 Hypoxemia: Secondary | ICD-10-CM | POA: Diagnosis not present

## 2018-07-01 DIAGNOSIS — I5042 Chronic combined systolic (congestive) and diastolic (congestive) heart failure: Secondary | ICD-10-CM | POA: Insufficient documentation

## 2018-07-01 DIAGNOSIS — R52 Pain, unspecified: Secondary | ICD-10-CM | POA: Diagnosis not present

## 2018-07-01 DIAGNOSIS — I130 Hypertensive heart and chronic kidney disease with heart failure and stage 1 through stage 4 chronic kidney disease, or unspecified chronic kidney disease: Secondary | ICD-10-CM | POA: Diagnosis not present

## 2018-07-01 DIAGNOSIS — Z79899 Other long term (current) drug therapy: Secondary | ICD-10-CM | POA: Diagnosis not present

## 2018-07-01 DIAGNOSIS — I2510 Atherosclerotic heart disease of native coronary artery without angina pectoris: Secondary | ICD-10-CM | POA: Insufficient documentation

## 2018-07-01 DIAGNOSIS — R531 Weakness: Secondary | ICD-10-CM

## 2018-07-01 DIAGNOSIS — Z7901 Long term (current) use of anticoagulants: Secondary | ICD-10-CM | POA: Insufficient documentation

## 2018-07-01 DIAGNOSIS — N183 Chronic kidney disease, stage 3 (moderate): Secondary | ICD-10-CM | POA: Insufficient documentation

## 2018-07-01 DIAGNOSIS — R05 Cough: Secondary | ICD-10-CM | POA: Diagnosis not present

## 2018-07-01 DIAGNOSIS — N3000 Acute cystitis without hematuria: Secondary | ICD-10-CM

## 2018-07-01 DIAGNOSIS — Z87891 Personal history of nicotine dependence: Secondary | ICD-10-CM | POA: Insufficient documentation

## 2018-07-01 LAB — CBC WITH DIFFERENTIAL/PLATELET
Abs Immature Granulocytes: 0.11 10*3/uL — ABNORMAL HIGH (ref 0.00–0.07)
Basophils Absolute: 0 10*3/uL (ref 0.0–0.1)
Basophils Relative: 0 %
Eosinophils Absolute: 0 10*3/uL (ref 0.0–0.5)
Eosinophils Relative: 0 %
HCT: 43 % (ref 39.0–52.0)
Hemoglobin: 13.9 g/dL (ref 13.0–17.0)
Immature Granulocytes: 1 %
Lymphocytes Relative: 3 %
Lymphs Abs: 0.5 10*3/uL — ABNORMAL LOW (ref 0.7–4.0)
MCH: 29.6 pg (ref 26.0–34.0)
MCHC: 32.3 g/dL (ref 30.0–36.0)
MCV: 91.7 fL (ref 80.0–100.0)
Monocytes Absolute: 1.4 10*3/uL — ABNORMAL HIGH (ref 0.1–1.0)
Monocytes Relative: 8 %
Neutro Abs: 14.8 10*3/uL — ABNORMAL HIGH (ref 1.7–7.7)
Neutrophils Relative %: 88 %
Platelets: 131 10*3/uL — ABNORMAL LOW (ref 150–400)
RBC: 4.69 MIL/uL (ref 4.22–5.81)
RDW: 14.3 % (ref 11.5–15.5)
WBC: 16.9 10*3/uL — ABNORMAL HIGH (ref 4.0–10.5)
nRBC: 0 % (ref 0.0–0.2)

## 2018-07-01 LAB — COMPREHENSIVE METABOLIC PANEL
ALT: 19 U/L (ref 0–44)
AST: 26 U/L (ref 15–41)
Albumin: 4.3 g/dL (ref 3.5–5.0)
Alkaline Phosphatase: 83 U/L (ref 38–126)
Anion gap: 11 (ref 5–15)
BUN: 26 mg/dL — ABNORMAL HIGH (ref 8–23)
CO2: 24 mmol/L (ref 22–32)
Calcium: 9.7 mg/dL (ref 8.9–10.3)
Chloride: 105 mmol/L (ref 98–111)
Creatinine, Ser: 1.52 mg/dL — ABNORMAL HIGH (ref 0.61–1.24)
GFR calc Af Amer: 47 mL/min — ABNORMAL LOW (ref >60)
GFR calc non Af Amer: 40 mL/min — ABNORMAL LOW (ref >60)
Glucose, Bld: 178 mg/dL — ABNORMAL HIGH (ref 70–99)
Potassium: 3.4 mmol/L — ABNORMAL LOW (ref 3.5–5.1)
Sodium: 140 mmol/L (ref 135–145)
Total Bilirubin: 1.1 mg/dL (ref 0.3–1.2)
Total Protein: 7.5 g/dL (ref 6.5–8.1)

## 2018-07-01 LAB — LACTIC ACID, PLASMA: Lactic Acid, Venous: 1.5 mmol/L (ref 0.5–1.9)

## 2018-07-01 MED ORDER — SODIUM CHLORIDE 0.9 % IV BOLUS
500.0000 mL | Freq: Once | INTRAVENOUS | Status: AC
  Administered 2018-07-01: 500 mL via INTRAVENOUS

## 2018-07-01 NOTE — ED Notes (Signed)
Bed: OI37 Expected date:  Expected time:  Means of arrival:  Comments: 85 yo M/ pelvic pain

## 2018-07-01 NOTE — ED Provider Notes (Signed)
[facility] DEPT Provider Note   CSN: [id number] Arrival date & time: 07/01/18  2110    History   Chief Complaint Chief Complaint  Patient presents with  . Weakness    Possible UTI    HPI Ronald [name] is a 85 y.o. male.     Patient is 85 year old male who presents with chills and fatigue.  He has a history of chronic kidney disease, coronary artery disease, CHF, he has an indwelling suprapubic catheter.  He has had some recurrent UTIs.  He states he woke up this morning feeling very fatigued.  He was having a hard time getting out of bed due to the generalized weakness.  He denies any unilateral symptoms.  No vision changes or speech deficits.  He has not had any noted fever but he has had some shaking chills.  Last night he said he had some discomfort around his suprapubic catheter radiating to his right flank.  He states that he thought the catheter got clogged up and his wife flushed it this morning and he felt better.  However then he noted the generalized weakness and the chills.  He currently denies any abdominal pain.  He was having some back pain last night but denies any currently.  No nausea or vomiting.  No diarrhea.  He states he has had a little bit of coughing but no other URI symptoms.  No shortness of breath or chest pain.     Past Medical History:  Diagnosis Date  . Arthritis    "joints ache" (01/02/2014)  . At risk for sleep apnea    STOP-BANG= 4    SENT TO PCP 12-23-2013  . Bladder calculi   . Bradycardia   . Chronic cystitis   . Chronic systolic CHF (congestive heart failure) (HCC)    a. EF improved to 50-55% in 2018, previously 35-40%  . CKD (chronic kidney disease), stage II   . Coronary artery disease CARDIOLOGIST-  DR Angelena Form   a. CAD s/p CABG in 1989. b. redo bypass in 1997. c. last cath 2015 - med rx.  . Diverticulosis   . GERD (gastroesophageal reflux disease)   . History of Bell's palsy    RIGHT SIDE-- NO RESIDUAL   . Hx of dizziness   . Hyperlipidemia   . Hypertension    "not anymore" (01/02/2014)  . Ischemic cardiomyopathy   . Kidney stones "years ago"   "passed them"  . Melanoma of ear ([place])    "right"  . Mild dementia ([place])   . Myocardial infarction ([place]) 1986; 1997  . Nocturia   . OAB (overactive bladder)   . Persistent atrial fibrillation   . Prostate cancer ([place]) 1998   S/P  [place]; [place]  . S/P CABG (coronary artery bypass graft)    [place]  . Sepsis due to urinary tract infection ([place])   . Urethral stricture   . UTI (urinary tract infection) 09/2015  . Wears hearing aid    bilateral  . Wears partial dentures     Patient Active Problem List   Diagnosis Date Noted  . UTI (urinary tract infection) 03/06/2018  . Chronic diastolic CHF (congestive heart failure) ([place]) 12/26/2017  . PAF (paroxysmal atrial fibrillation) ([place]) 12/26/2017  . Fever 05/11/2017  . AKI (acute kidney injury) ([place])   . CKD (chronic kidney disease), stage III ([place])   . Bacteremia due to Pseudomonas 01/21/2016  .  Debility 01/21/2016  . History of Bell's palsy   . Gastroesophageal reflux disease without esophagitis   . Coronary artery disease involving coronary bypass graft of native heart without angina pectoris   . History of prostate cancer   . Suprapubic catheter ([place])   . Incontinence of feces   . Renal failure syndrome   . Bacteremia   . NSTEMI (non-ST elevated myocardial infarction) ([place]) 01/16/2016  . ARF (acute renal failure) ([place]) 10/13/2015  . Influenza A 12/05/2014  . Urinary tract infection associated with cystostomy catheter ([place]) 10/03/2014  . Acute lower UTI 09/10/2014  . Bacteremia due to Enterococcus 01/22/2014  . Chronic systolic CHF (congestive heart failure) ([place]) April 13, 202015  . Urethral stricture 12/25/2013  . Bladder calculus 12/25/2013  . LV dysfunction 09/23/2013  . Unstable angina ([place]) 08/27/2013  . Bacteremia due to Escherichia  coli 05/20/2012  . Urinary tract infection 09/15/2011  . Calculus of kidney 09/12/2011  . HYPERCHOLESTEROLEMIA 10/18/2007  . HTN (hypertension) 10/18/2007  . OSTEOARTHRITIS 02/07/2007  . BELL'S PALSY, RIGHT 10/26/2006  . Coronary atherosclerosis 10/26/2006  . PROSTATE CANCER, HX OF 10/26/2006  . NEPHROLITHIASIS, HX OF 10/26/2006    Past Surgical History:  Procedure Laterality Date  . CARDIAC CATHETERIZATION  02-22-2005  dr Vidal Schwalbe   mild to moderate lv dysfunction with inferobasilar akinesis/  totally occluded SVG to Intermediate Diagonal and SVG to PDA and RCA branches, totally occluded native coronary circulation with diffuse disease pLAD diagonal system with potentially could be ischemic/  patent SVG to OM with collaterals to dRCA and patent LIMA to LAD and diagonal systemss  . CARDIAC CATHETERIZATION  08-28-2013  DR Daneen Schick   widely patent sequential left internal graft to the diagonal/LAD, widely patent SVG to OM with proximal 50% narrowing noted in the graft, total occlusion SVG's to RCA, RI, and the Diagonal/ LV dysfunction with inferobasal aneurysm and mid anterior wall region of akinesis/  overall EF 35-40%/  Total occlusion of the navtive circulation/  no significant change compared to 2006 cath  . CARDIOVASCULAR STRESS TEST  08-01-2011  dr Angelena Form   inferior scar and possible soft tissue attenuation with minimal peri-infarct ischemia, small region of anterior ischemia and scar/  LVEF 53% LV wall motion with inferior hypokinesis/ no significant change from scan july 2011  . CATARACT EXTRACTION W/ INTRAOCULAR LENS  IMPLANT, BILATERAL Bilateral 2007  . CORONARY ARTERY BYPASS GRAFT  11/ [place]-- 6 vessel/  1997 Re-do 5 vessel  . CYSTOSCOPY WITH URETHRAL DILATATION N/A 12/25/2013   Procedure: CYSTOSCOPY WITH URETHRAL DILATATION, WITH BIOPSY;  Surgeon: Bernestine Amass, MD;  Location: [facility];  Service: Urology;  Laterality: N/A;  . CYSTOSCOPY WITH  URETHRAL DILATATION N/A 12/22/2014   Procedure: CYSTOSCOPY WITH URETHRAL DILATATION;  Surgeon: Rana Snare, MD;  Location: [facility];  Service: Urology;  Laterality: N/A;  BALLOON DILATION CATHETER    . EUS  10/05/2011   Procedure: ESOPHAGEAL ENDOSCOPIC ULTRASOUND (EUS) RADIAL;  Surgeon: Arta Silence, MD;  Location: [facility];  Service: Endoscopy;  Laterality: N/A;  . INSERTION OF SUPRAPUBIC CATHETER N/A 12/22/2014   Procedure: INSERTION OF SUPRAPUBIC CATHETER ;  Surgeon: Rana Snare, MD;  Location: [facility];  Service: Urology;  Laterality: N/A;  . LAPAROSCOPIC CHOLECYSTECTOMY  12-23-2005  . LEFT HEART CATHETERIZATION WITH CORONARY/GRAFT ANGIOGRAM N/A 08/28/2013   Procedure: LEFT HEART CATHETERIZATION WITH Beatrix Fetters;  Surgeon: Sinclair Grooms, MD;  Location: [facility] C[facility] LAB;  Service: Cardiovascular;  Laterality: N/A;  . MELANOMA EXCISION  X 1   "ear"  . RADIOACTIVE PROSTATE SEED IMPLANTS  1998  . SKIN CANCER EXCISION  X 2   "top of head"  . TONSILLECTOMY AND ADENOIDECTOMY  1954        Home Medications    Prior to Admission medications   Medication Sig Start Date End Date Taking? Authorizing Provider  acetaminophen (TYLENOL) 500 MG tablet Take 500 mg by mouth every 6 (six) hours as needed for moderate pain or fever.    Yes [provider]  apixaban (ELIQUIS) 5 MG TABS tablet Take 1 tablet (5 mg total) by mouth 2 (two) times daily. 05/10/17  Yes Burnell Blanks, MD  atorvastatin (LIPITOR) 80 MG tablet Take 0.5 tablets (40 mg total) by mouth every evening. 05/24/17  Yes Marletta Lor, MD  cephALEXin (KEFLEX) 250 MG capsule Take 250 mg by mouth daily.  05/10/18  Yes [provider]  flavoxATE (URISPAS) 100 MG tablet Take 100 mg by mouth 3 (three) times daily as needed for bladder spasms.   Yes [provider]  furosemide (LASIX) 20 MG tablet Take 20 mg by mouth daily as needed for fluid or edema.    Yes [provider]   hydroxypropyl methylcellulose (ISOPTO TEARS) 2.5 % ophthalmic solution Place 1 drop into both eyes 2 (two) times daily.    Yes [provider]  meclizine (ANTIVERT) 12.5 MG tablet Take 1 tablet (12.5 mg total) by mouth 3 (three) times daily as needed for dizziness. 06/11/18  Yes Isaac Bliss, [name], MD  Multiple Vitamin (MULTIVITAMIN WITH MINERALS) TABS Take 1 tablet by mouth daily.   Yes [provider]  Polyethyl Glycol-Propyl Glycol (SYSTANE) 0.4-0.3 % SOLN Apply 1 drop to eye 3 (three) times daily.   Yes [provider]  ranitidine (ZANTAC) 150 MG tablet Take 1 tablet (150 mg total) by mouth 2 (two) times daily. 05/18/16  Yes Burnell Blanks, MD  Calcium Carbonate Antacid (TUMS PO) Take 3 tablets by mouth daily as needed (gas).    [provider]  nitroGLYCERIN (NITROSTAT) 0.4 MG SL tablet Place 0.4 mg under the tongue every 5 (five) minutes as needed for chest pain.     [provider]    Family History Family History  Problem Relation Age of Onset  . Heart disease Mother   . Diabetes Father   . Heart disease Brother     Social History Social History   Tobacco Use  . Smoking status: Former Smoker    Years: 40.00    Types: Cigars    Last attempt to quit: 12/24/1983    Years since quitting: 34.5  . Smokeless tobacco: Former Systems developer    Types: Chew    Quit date: 08/28/2013  . Tobacco comment: smoked a pipe  Substance Use Topics  . Alcohol use: No  . Drug use: No     Allergies   Pantoprazole; Nitrofurantoin; Sulfa antibiotics; Lidocaine; and Tape   Review of Systems Review of Systems  Constitutional: Positive for chills and fatigue. Negative for diaphoresis and fever.  HENT: Negative for congestion, rhinorrhea and sneezing.   Eyes: Negative.   Respiratory: Positive for cough. Negative for chest tightness and shortness of breath.   Cardiovascular: Negative for chest pain and leg swelling.  Gastrointestinal: Positive  for abdominal pain (More last night now he only has some discomfort around his suprapubic catheter). Negative for blood in stool, diarrhea, nausea and vomiting.  Genitourinary:  Negative for difficulty urinating, flank pain, frequency and hematuria.  Musculoskeletal: Negative for arthralgias and back pain.  Skin: Negative for rash.  Neurological: Positive for weakness (Generalized). Negative for dizziness, speech difficulty, numbness and headaches.     Physical Exam Updated Vital Signs BP 106/67   Pulse 76   Temp 100.2 F (37.9 C) (Oral)   Resp 19   Ht 5\' 5"  (1.651 m)   Wt 69.9 kg   SpO2 96%   BMI 25.63 kg/m   Physical Exam Constitutional:      Appearance: He is well-developed.  HENT:     Head: Normocephalic and atraumatic.  Eyes:     Pupils: Pupils are equal, round, and reactive to light.  Neck:     Musculoskeletal: Normal range of motion and neck supple.  Cardiovascular:     Rate and Rhythm: Normal rate and regular rhythm.     Heart sounds: Normal heart sounds.  Pulmonary:     Effort: Pulmonary effort is normal. No respiratory distress.     Breath sounds: Normal breath sounds. No wheezing or rales.  Chest:     Chest wall: No tenderness.  Abdominal:     General: Bowel sounds are normal.     Palpations: Abdomen is soft.     Tenderness: There is abdominal tenderness (Mild tenderness around the suprapubic catheter.  There is no surrounding erythema or drainage.). There is no guarding or rebound.  Musculoskeletal: Normal range of motion.  Lymphadenopathy:     Cervical: No cervical adenopathy.  Skin:    General: Skin is warm and dry.     Findings: No rash.  Neurological:     General: No focal deficit present.     Mental Status: He is alert and oriented to person, place, and time.      ED Treatments / Results  Labs (all labs ordered are listed, but only abnormal results are displayed) Labs Reviewed  COMPREHENSIVE METABOLIC PANEL - Abnormal; Notable for the  following components:      Result Value   Potassium 3.4 (*)    Glucose, Bld 178 (*)    BUN 26 (*)    Creatinine, Ser 1.52 (*)    GFR calc non Af Amer 40 (*)    GFR calc Af Amer 47 (*)    All other components within normal limits  CBC WITH DIFFERENTIAL/PLATELET - Abnormal; Notable for the following components:   WBC 16.9 (*)    Platelets 131 (*)    Neutro Abs 14.8 (*)    Lymphs Abs 0.5 (*)    Monocytes Absolute 1.4 (*)    Abs Immature Granulocytes 0.11 (*)    All other components within normal limits  CULTURE, BLOOD (ROUTINE X 2)  CULTURE, BLOOD (ROUTINE X 2)  URINE CULTURE  LACTIC ACID, PLASMA  LACTIC ACID, PLASMA  URINALYSIS, ROUTINE W REFLEX MICROSCOPIC    EKG EKG Interpretation  Date/Time:  Sunday July 01 2018 22:51:03 EDT Ventricular Rate:  85 PR Interval:    QRS Duration: 138 QT Interval:  392 QTC Calculation: 467 R Axis:   -18 Text Interpretation:  Sinus rhythm Multiple premature complexes, vent & supraven LVH with IVCD and secondary repol abnrm Minimal ST elevation, inferior leads similar to prior EKGs Confirmed by Malvin Johns [phone]) on 07/01/2018 11:00:03 PM   Radiology Dg Chest 2 View  Result Date: 07/01/2018 CLINICAL DATA:  Cough EXAM: CHEST - 2 VIEW COMPARISON:  None. FINDINGS: The heart size and mediastinal contours are within normal limits.  Both lungs are clear. The visualized skeletal structures are unremarkable. Remote median sternotomy IMPRESSION: No active cardiopulmonary disease. Electronically Signed   By: Ulyses Jarred M.D.   On: 07/01/2018 22:48    Procedures Procedures (including critical care time)  Medications Ordered in ED Medications  sodium chloride 0.9 % bolus 500 mL (500 mLs Intravenous New Bag/Given 07/01/18 2341)     Initial Impression / Assessment and Plan / ED Course  I have reviewed the triage vital signs and the nursing notes.  Pertinent labs & imaging results that were available during my care of the patient were reviewed by  me and considered in my medical decision making (see chart for details).        Patient is 85 year old male who presents with generalized weakness and difficulty ambulating this morning.  His chest x-ray is clear without evidence of pneumonia.  He has a temperature of 100.2.  He has an elevated white count.  His lactate is normal and he has no other sirs criteria for sepsis.  His creatinine is just slightly higher than his prior values.  I suspect his infection is coming from his suprapubic catheter although his urine is still pending.  I suspect that he will need admission for further treatment.  Dr. Dayna Barker to follow-up on his urinalysis and disposition.  Final Clinical Impressions(s) / ED Diagnoses   Final diagnoses:  None    ED Discharge Orders    None       Malvin Johns, MD 07/02/18 0005

## 2018-07-01 NOTE — ED Notes (Signed)
Pt said he was suppose to get his cath changed on 4/2 or 4/3 but did not.

## 2018-07-01 NOTE — ED Triage Notes (Signed)
[facility] transported pt from home to [facility] ED and reports the following:  --pt has suprapubic cath. 2 weeks ago urologist put him on abx for suspected UTI --a few hours he started feeling weak, cold chills, and pelvic pain  --wife said he had tylenol earlier today but couldn't remember exact  T 100 HR 90s NSR BP 138/78

## 2018-07-02 DIAGNOSIS — R531 Weakness: Secondary | ICD-10-CM | POA: Diagnosis not present

## 2018-07-02 LAB — URINALYSIS, ROUTINE W REFLEX MICROSCOPIC
Bilirubin Urine: NEGATIVE
Glucose, UA: NEGATIVE mg/dL
Ketones, ur: NEGATIVE mg/dL
Nitrite: NEGATIVE
Protein, ur: 100 mg/dL — AB
RBC / HPF: >50 RBC/hpf — ABNORMAL HIGH (ref 0–5)
Specific Gravity, Urine: 1.01 (ref 1.005–1.030)
WBC, UA: >50 WBC/hpf — ABNORMAL HIGH (ref 0–5)
pH: 6 (ref 5.0–8.0)

## 2018-07-02 MED ORDER — CIPROFLOXACIN HCL 500 MG PO TABS
500.0000 mg | ORAL_TABLET | Freq: Once | ORAL | Status: AC
  Administered 2018-07-02: 500 mg via ORAL
  Filled 2018-07-02: qty 1

## 2018-07-02 MED ORDER — CIPROFLOXACIN HCL 500 MG PO TABS: 500.0000 mg | ORAL_TABLET | Freq: Two times a day (BID) | ORAL | 0 refills | Status: DC

## 2018-07-02 MED ORDER — ACETAMINOPHEN 325 MG PO TABS
650.0000 mg | ORAL_TABLET | Freq: Once | ORAL | Status: AC
  Administered 2018-07-02: 01:00:00 650 mg via ORAL
  Filled 2018-07-02: qty 2

## 2018-07-02 NOTE — ED Provider Notes (Signed)
12:36 AM Assumed care from Dr. Tamera Punt, please see their note for full history, physical and decision making until this point. In brief this is a 85 y.o. year old male who presented to the ED tonight with Weakness (Possible UTI)     Patient with weakness this morning nonfocal just generalized weakness not able to get the bedside present here for further evaluation.  Patient is generally weak but otherwise appears well.  Had a elevated temperature of 100.2 and had chills earlier.  Elevated white blood cell count 16.9.  Plan for urine and likely admission.  Urine does appear to be infected, culture already sent.  Patient states usually gets ciprofloxacin for his infections.  My discussed admission he seemed surprised and did not think he needed to be admitted.  He states that he feels much better than he did before and thinks he should build to go home.  I discussed the need to be able to take care of himself at home and he understood that.  As a compromise we are going to give him his antibiotics to eat and some Tylenol per his request and will see how well he does with ambulation and if patient is able to get around close to baseline we will allow him to be discharged if not we will try to reiterate the need for admission.  Offered patient observation stay to ensure that he was in fact improved, however he declined. He was able to tolerate PO.  Ambulate 'better than usual' with a walker. Seemed to be able to care for himself and his ADL's. Was informed and competent to participate in shared decision making and he requests to go home.  Will dc on abx w/ close pcp follow up, strict return precautions.  Discharge instructions, including strict return precautions for new or worsening symptoms, given. Patient and/or family verbalized understanding and agreement with the plan as described.   Labs, studies and imaging reviewed by myself and considered in medical decision making if ordered. Imaging interpreted by  radiology.  Labs Reviewed  COMPREHENSIVE METABOLIC PANEL - Abnormal; Notable for the following components:      Result Value   Potassium 3.4 (*)    Glucose, Bld 178 (*)    BUN 26 (*)    Creatinine, Ser 1.52 (*)    GFR calc non Af Amer 40 (*)    GFR calc Af Amer 47 (*)    All other components within normal limits  CBC WITH DIFFERENTIAL/PLATELET - Abnormal; Notable for the following components:   WBC 16.9 (*)    Platelets 131 (*)    Neutro Abs 14.8 (*)    Lymphs Abs 0.5 (*)    Monocytes Absolute 1.4 (*)    Abs Immature Granulocytes 0.11 (*)    All other components within normal limits  URINALYSIS, ROUTINE W REFLEX MICROSCOPIC - Abnormal; Notable for the following components:   APPearance CLOUDY (*)    Hgb urine dipstick LARGE (*)    Protein, ur 100 (*)    Leukocytes,Ua LARGE (*)    RBC / HPF >50 (*)    WBC, UA >50 (*)    Bacteria, UA RARE (*)    All other components within normal limits  CULTURE, BLOOD (ROUTINE X 2)  CULTURE, BLOOD (ROUTINE X 2)  URINE CULTURE  LACTIC ACID, PLASMA  LACTIC ACID, PLASMA    DG Chest 2 View  Final Result      No follow-ups on file.    Addis Tuohy, Corene Cornea, MD  07/02/18 0242  

## 2018-07-03 DIAGNOSIS — N35812 Other urethral bulbous stricture, male: Secondary | ICD-10-CM | POA: Diagnosis not present

## 2018-07-03 LAB — URINE CULTURE: Culture: >=100000 — AB

## 2018-07-04 ENCOUNTER — Telehealth: Payer: Self-pay | Admitting: Emergency Medicine

## 2018-07-04 NOTE — Telephone Encounter (Signed)
Post ED Visit - Positive Culture Follow-up  Culture report reviewed by antimicrobial stewardship pharmacist: [facility] Team []  Elenor Quinones, Pharm.D. []  Heide Guile, Pharm.D., BCPS AQ-ID []  Parks Neptune, Pharm.D., BCPS []  Alycia Rossetti, Pharm.D., BCPS []  [place], Pharm.D., BCPS, AAHIVP []  Legrand Como, Pharm.D., BCPS, AAHIVP []  Salome Arnt, PharmD, BCPS []  Johnnette Gourd, PharmD, BCPS []  Hughes Better, PharmD, BCPS []  Leeroy Cha, PharmD []  Laqueta Linden, PharmD, BCPS []  Albertina Parr, PharmD  [place] Team []  Leodis Sias, PharmD []  Lindell Spar, PharmD []  Royetta Asal, PharmD []  Graylin Shiver, Rph []  Rema Fendt) Glennon Mac, PharmD []  Arlyn Dunning, PharmD []  Netta Cedars, PharmD [x]  Dia Sitter, PharmD []  Leone Haven, PharmD []  Gretta Arab, PharmD []  Theodis Shove, PharmD []  Peggyann Juba, PharmD []  Reuel Boom, PharmD   Positive urine culture Treated with ciprofloxacin, organism sensitive to the same and no further patient follow-up is required at this time.  Hazle Nordmann 07/04/2018, 9:45 AM

## 2018-07-07 LAB — CULTURE, BLOOD (ROUTINE X 2)
Culture: NO GROWTH
Culture: NO GROWTH
Special Requests: ADEQUATE

## 2018-07-09 ENCOUNTER — Ambulatory Visit: Payer: Medicare Other | Admitting: Physician Assistant

## 2018-07-19 ENCOUNTER — Encounter (HOSPITAL_COMMUNITY): Payer: Medicare Other

## 2018-07-31 DIAGNOSIS — N35011 Post-traumatic bulbous urethral stricture: Secondary | ICD-10-CM | POA: Diagnosis not present

## 2018-08-02 ENCOUNTER — Telehealth: Payer: Self-pay | Admitting: Internal Medicine

## 2018-08-02 NOTE — Telephone Encounter (Signed)
Pts daughter Uvaldo Bristle) was calling to reschedule the lab appointment but was not sure if they should reschedule the June 2020 appointment due to the coronavirus and she stated that he has been back in the hospital on 07/01/2018 but has not scheduled an hospital follow-up.  Daughter would like to have a call back [phone].

## 2018-08-02 NOTE — Telephone Encounter (Signed)
Spoke with daughter and lab appointment rescheduled.

## 2018-08-06 ENCOUNTER — Other Ambulatory Visit: Payer: Medicare Other

## 2018-08-08 ENCOUNTER — Ambulatory Visit: Payer: Medicare Other | Admitting: Physician Assistant

## 2018-08-13 ENCOUNTER — Other Ambulatory Visit: Payer: Self-pay

## 2018-08-13 ENCOUNTER — Other Ambulatory Visit (INDEPENDENT_AMBULATORY_CARE_PROVIDER_SITE_OTHER): Payer: Medicare Other

## 2018-08-13 DIAGNOSIS — E038 Other specified hypothyroidism: Secondary | ICD-10-CM

## 2018-08-13 DIAGNOSIS — E039 Hypothyroidism, unspecified: Secondary | ICD-10-CM

## 2018-08-13 LAB — T3, FREE: T3, Free: 2.6 pg/mL (ref 2.3–4.2)

## 2018-08-13 LAB — T4, FREE: Free T4: 0.94 ng/dL (ref 0.60–1.60)

## 2018-08-13 LAB — TSH: TSH: 3.8 u[IU]/mL (ref 0.35–4.50)

## 2018-08-28 ENCOUNTER — Emergency Department (HOSPITAL_COMMUNITY): Payer: Medicare Other

## 2018-08-28 ENCOUNTER — Inpatient Hospital Stay (HOSPITAL_COMMUNITY)
Admission: EM | Admit: 2018-08-28 | Discharge: 2018-09-01 | DRG: 698 | Disposition: A | Discharge status: Home Health | Payer: Medicare Other | Attending: Internal Medicine | Admitting: Internal Medicine

## 2018-08-28 ENCOUNTER — Ambulatory Visit: Payer: Self-pay | Admitting: *Deleted

## 2018-08-28 ENCOUNTER — Encounter (HOSPITAL_COMMUNITY): Payer: Self-pay

## 2018-08-28 DIAGNOSIS — Z885 Allergy status to narcotic agent status: Secondary | ICD-10-CM

## 2018-08-28 DIAGNOSIS — Z20828 Contact with and (suspected) exposure to other viral communicable diseases: Secondary | ICD-10-CM | POA: Diagnosis not present

## 2018-08-28 DIAGNOSIS — N179 Acute kidney failure, unspecified: Secondary | ICD-10-CM | POA: Diagnosis present

## 2018-08-28 DIAGNOSIS — E7800 Pure hypercholesterolemia, unspecified: Secondary | ICD-10-CM | POA: Diagnosis present

## 2018-08-28 DIAGNOSIS — N3021 Other chronic cystitis with hematuria: Secondary | ICD-10-CM | POA: Diagnosis not present

## 2018-08-28 DIAGNOSIS — Y738 Miscellaneous gastroenterology and urology devices associated with adverse incidents, not elsewhere classified: Secondary | ICD-10-CM | POA: Diagnosis present

## 2018-08-28 DIAGNOSIS — I2510 Atherosclerotic heart disease of native coronary artery without angina pectoris: Secondary | ICD-10-CM | POA: Diagnosis present

## 2018-08-28 DIAGNOSIS — T83091A Other mechanical complication of indwelling urethral catheter, initial encounter: Principal | ICD-10-CM | POA: Diagnosis present

## 2018-08-28 DIAGNOSIS — Z882 Allergy status to sulfonamides status: Secondary | ICD-10-CM

## 2018-08-28 DIAGNOSIS — I252 Old myocardial infarction: Secondary | ICD-10-CM

## 2018-08-28 DIAGNOSIS — I25810 Atherosclerosis of coronary artery bypass graft(s) without angina pectoris: Secondary | ICD-10-CM | POA: Diagnosis present

## 2018-08-28 DIAGNOSIS — N183 Chronic kidney disease, stage 3 unspecified: Secondary | ICD-10-CM | POA: Diagnosis present

## 2018-08-28 DIAGNOSIS — I21A1 Myocardial infarction type 2: Secondary | ICD-10-CM | POA: Diagnosis present

## 2018-08-28 DIAGNOSIS — K5790 Diverticulosis of intestine, part unspecified, without perforation or abscess without bleeding: Secondary | ICD-10-CM | POA: Diagnosis present

## 2018-08-28 DIAGNOSIS — D631 Anemia in chronic kidney disease: Secondary | ICD-10-CM | POA: Diagnosis present

## 2018-08-28 DIAGNOSIS — N390 Urinary tract infection, site not specified: Secondary | ICD-10-CM | POA: Diagnosis present

## 2018-08-28 DIAGNOSIS — N3281 Overactive bladder: Secondary | ICD-10-CM | POA: Diagnosis present

## 2018-08-28 DIAGNOSIS — Z8249 Family history of ischemic heart disease and other diseases of the circulatory system: Secondary | ICD-10-CM

## 2018-08-28 DIAGNOSIS — F0390 Unspecified dementia without behavioral disturbance: Secondary | ICD-10-CM | POA: Diagnosis present

## 2018-08-28 DIAGNOSIS — Z85820 Personal history of malignant melanoma of skin: Secondary | ICD-10-CM

## 2018-08-28 DIAGNOSIS — Z7901 Long term (current) use of anticoagulants: Secondary | ICD-10-CM | POA: Diagnosis not present

## 2018-08-28 DIAGNOSIS — E785 Hyperlipidemia, unspecified: Secondary | ICD-10-CM | POA: Diagnosis present

## 2018-08-28 DIAGNOSIS — I5022 Chronic systolic (congestive) heart failure: Secondary | ICD-10-CM | POA: Diagnosis present

## 2018-08-28 DIAGNOSIS — K219 Gastro-esophageal reflux disease without esophagitis: Secondary | ICD-10-CM | POA: Diagnosis present

## 2018-08-28 DIAGNOSIS — I130 Hypertensive heart and chronic kidney disease with heart failure and stage 1 through stage 4 chronic kidney disease, or unspecified chronic kidney disease: Secondary | ICD-10-CM | POA: Diagnosis not present

## 2018-08-28 DIAGNOSIS — I340 Nonrheumatic mitral (valve) insufficiency: Secondary | ICD-10-CM | POA: Diagnosis not present

## 2018-08-28 DIAGNOSIS — Z923 Personal history of irradiation: Secondary | ICD-10-CM

## 2018-08-28 DIAGNOSIS — I255 Ischemic cardiomyopathy: Secondary | ICD-10-CM | POA: Diagnosis present

## 2018-08-28 DIAGNOSIS — Z8546 Personal history of malignant neoplasm of prostate: Secondary | ICD-10-CM

## 2018-08-28 DIAGNOSIS — I4819 Other persistent atrial fibrillation: Secondary | ICD-10-CM | POA: Diagnosis present

## 2018-08-28 DIAGNOSIS — E872 Acidosis: Secondary | ICD-10-CM | POA: Diagnosis present

## 2018-08-28 DIAGNOSIS — Z974 Presence of external hearing-aid: Secondary | ICD-10-CM

## 2018-08-28 DIAGNOSIS — Z833 Family history of diabetes mellitus: Secondary | ICD-10-CM

## 2018-08-28 DIAGNOSIS — R778 Other specified abnormalities of plasma proteins: Secondary | ICD-10-CM | POA: Diagnosis present

## 2018-08-28 DIAGNOSIS — I4892 Unspecified atrial flutter: Secondary | ICD-10-CM | POA: Diagnosis present

## 2018-08-28 DIAGNOSIS — I480 Paroxysmal atrial fibrillation: Secondary | ICD-10-CM | POA: Diagnosis present

## 2018-08-28 DIAGNOSIS — D649 Anemia, unspecified: Secondary | ICD-10-CM | POA: Diagnosis present

## 2018-08-28 DIAGNOSIS — G4733 Obstructive sleep apnea (adult) (pediatric): Secondary | ICD-10-CM | POA: Diagnosis present

## 2018-08-28 DIAGNOSIS — Z87891 Personal history of nicotine dependence: Secondary | ICD-10-CM

## 2018-08-28 DIAGNOSIS — I10 Essential (primary) hypertension: Secondary | ICD-10-CM | POA: Diagnosis present

## 2018-08-28 DIAGNOSIS — R0602 Shortness of breath: Secondary | ICD-10-CM | POA: Diagnosis not present

## 2018-08-28 DIAGNOSIS — Z888 Allergy status to other drugs, medicaments and biological substances status: Secondary | ICD-10-CM

## 2018-08-28 DIAGNOSIS — M1990 Unspecified osteoarthritis, unspecified site: Secondary | ICD-10-CM | POA: Diagnosis present

## 2018-08-28 DIAGNOSIS — R7989 Other specified abnormal findings of blood chemistry: Secondary | ICD-10-CM | POA: Diagnosis present

## 2018-08-28 LAB — BASIC METABOLIC PANEL
Anion gap: 12 (ref 5–15)
BUN: 44 mg/dL — ABNORMAL HIGH (ref 8–23)
CO2: 21 mmol/L — ABNORMAL LOW (ref 22–32)
Calcium: 9.1 mg/dL (ref 8.9–10.3)
Chloride: 106 mmol/L (ref 98–111)
Creatinine, Ser: 3.28 mg/dL — ABNORMAL HIGH (ref 0.61–1.24)
GFR calc Af Amer: 18 mL/min — ABNORMAL LOW (ref >60)
GFR calc non Af Amer: 16 mL/min — ABNORMAL LOW (ref >60)
Glucose, Bld: 110 mg/dL — ABNORMAL HIGH (ref 70–99)
Potassium: 4.1 mmol/L (ref 3.5–5.1)
Sodium: 139 mmol/L (ref 135–145)

## 2018-08-28 LAB — CBC
HCT: 33.3 % — ABNORMAL LOW (ref 39.0–52.0)
Hemoglobin: 10.9 g/dL — ABNORMAL LOW (ref 13.0–17.0)
MCH: 30.2 pg (ref 26.0–34.0)
MCHC: 32.7 g/dL (ref 30.0–36.0)
MCV: 92.2 fL (ref 80.0–100.0)
Platelets: 129 10*3/uL — ABNORMAL LOW (ref 150–400)
RBC: 3.61 MIL/uL — ABNORMAL LOW (ref 4.22–5.81)
RDW: 14.6 % (ref 11.5–15.5)
WBC: 8.4 10*3/uL (ref 4.0–10.5)
nRBC: 0 % (ref 0.0–0.2)

## 2018-08-28 LAB — URINALYSIS, ROUTINE W REFLEX MICROSCOPIC
Bilirubin Urine: NEGATIVE
Glucose, UA: NEGATIVE mg/dL
Ketones, ur: NEGATIVE mg/dL
Nitrite: NEGATIVE
Protein, ur: NEGATIVE mg/dL
Specific Gravity, Urine: 1.004 — ABNORMAL LOW (ref 1.005–1.030)
pH: 6 (ref 5.0–8.0)

## 2018-08-28 LAB — PROTIME-INR
INR: 1.3 — ABNORMAL HIGH (ref 0.8–1.2)
Prothrombin Time: 16.4 s — ABNORMAL HIGH (ref 11.4–15.2)

## 2018-08-28 LAB — I-STAT TROPONIN, ED: Troponin i, poc: 0.52 ng/mL (ref 0.00–0.08)

## 2018-08-28 LAB — BRAIN NATRIURETIC PEPTIDE: B Natriuretic Peptide: 441 pg/mL — ABNORMAL HIGH (ref 0.0–100.0)

## 2018-08-28 LAB — TROPONIN I: Troponin I: 0.7 ng/mL (ref <0.03)

## 2018-08-28 LAB — SARS CORONAVIRUS 2 BY RT PCR (HOSPITAL ORDER, PERFORMED IN ~~LOC~~ HOSPITAL LAB): SARS Coronavirus 2: NEGATIVE

## 2018-08-28 LAB — MAGNESIUM: Magnesium: 2.1 mg/dL (ref 1.7–2.4)

## 2018-08-28 MED ORDER — POLYETHYL GLYCOL-PROPYL GLYCOL 0.4-0.3 % OP SOLN: 1.0000 [drp] | Freq: Three times a day (TID) | OPHTHALMIC | Status: DC

## 2018-08-28 MED ORDER — NITROGLYCERIN 0.4 MG SL SUBL: 0.4000 mg | SUBLINGUAL_TABLET | SUBLINGUAL | Status: DC | PRN

## 2018-08-28 MED ORDER — HYPROMELLOSE (GONIOSCOPIC) 2.5 % OP SOLN
1.0000 [drp] | Freq: Two times a day (BID) | OPHTHALMIC | Status: DC
  Filled 2018-08-28: qty 15

## 2018-08-28 MED ORDER — ACETAMINOPHEN 325 MG PO TABS
650.0000 mg | ORAL_TABLET | Freq: Four times a day (QID) | ORAL | Status: DC | PRN
  Administered 2018-08-29 – 2018-08-31 (×4): 650 mg via ORAL
  Filled 2018-08-28 (×4): qty 2

## 2018-08-28 MED ORDER — ACETAMINOPHEN 650 MG RE SUPP: 650.0000 mg | Freq: Four times a day (QID) | RECTAL | Status: DC | PRN

## 2018-08-28 MED ORDER — ACETAMINOPHEN 500 MG PO TABS: 500.0000 mg | ORAL_TABLET | Freq: Four times a day (QID) | ORAL | Status: DC | PRN

## 2018-08-28 MED ORDER — SODIUM CHLORIDE 0.45 % IV SOLN
INTRAVENOUS | Status: AC
  Administered 2018-08-28 – 2018-08-29 (×2): via INTRAVENOUS

## 2018-08-28 MED ORDER — ATORVASTATIN CALCIUM 40 MG PO TABS
40.0000 mg | ORAL_TABLET | Freq: Every evening | ORAL | Status: DC
  Administered 2018-08-29 – 2018-08-31 (×3): 40 mg via ORAL
  Filled 2018-08-28 (×3): qty 1

## 2018-08-28 MED ORDER — FAMOTIDINE 20 MG PO TABS
20.0000 mg | ORAL_TABLET | Freq: Every day | ORAL | Status: DC
  Administered 2018-08-28 – 2018-09-01 (×5): 20 mg via ORAL
  Filled 2018-08-28 (×5): qty 1

## 2018-08-28 MED ORDER — APIXABAN 5 MG PO TABS
5.0000 mg | ORAL_TABLET | Freq: Two times a day (BID) | ORAL | Status: DC
  Administered 2018-08-29: 5 mg via ORAL
  Filled 2018-08-28: qty 1

## 2018-08-28 NOTE — ED Triage Notes (Signed)
Pt complains of shob x 5 days. Hypertensive. Axox4

## 2018-08-28 NOTE — ED Notes (Signed)
ED TO INPATIENT HANDOFF REPORT  ED Nurse Name and Phone #: [phone]  S Name/Age/Gender Ronald [name] 85 y.o. male Room/Bed: 043C/043C  Code Status   Code Status: Full Code  Home/SNF/Other Home Patient oriented to: self, place, time and situation Is this baseline? Yes   Triage Complete: Triage complete  Chief Complaint SHOB, Dentail pain  Triage Note Pt complains of shob x 5 days. Hypertensive. Axox4   Allergies Allergies  Allergen Reactions  . Pantoprazole Other (See Comments)    Headache and lightheaded  . Nitrofurantoin Hives    dizziness and lightheaded   . Sulfa Antibiotics Hives and Itching  . Lidocaine Swelling    Mouth swelling   . Tape Other (See Comments)    TAPE WILL TEAR THE SKIN!!!    Level of Care/Admitting Diagnosis ED Disposition    ED Disposition Condition Comment   Admit  Hospital Area: [facility] [[id number]]  Level of Care: Telemetry Medical [104]  Covid Evaluation: Screening Protocol (No Symptoms)  Diagnosis: AKI (acute kidney injury) [facility]) [[id number]]  Admitting Physician: Reubin Milan [[id number]]  Attending Physician: Reubin Milan [[id number]]  Estimated length of stay: past midnight tomorrow  Certification:: I certify this patient will need inpatient services for at least 2 midnights  PT Class (Do Not Modify): Inpatient [101]  PT Acc Code (Do Not Modify): Private [1]       B Medical/Surgery History Past Medical History:  Diagnosis Date  . Arthritis    "joints ache" (01/02/2014)  . At risk for sleep apnea    STOP-BANG= 4    SENT TO PCP 12-23-2013  . Bladder calculi   . Bradycardia   . Chronic cystitis   . Chronic systolic CHF (congestive heart failure) (HCC)    a. EF improved to 50-55% in 2018, previously 35-40%  . CKD (chronic kidney disease), stage II   . Coronary artery disease CARDIOLOGIST-  DR Angelena Form   a. CAD s/p CABG in 1989. b. redo bypass in 1997. c. last cath 2015 - med rx.  . Diverticulosis    . GERD (gastroesophageal reflux disease)   . History of Bell's palsy    RIGHT SIDE-- NO RESIDUAL  . Hx of dizziness   . Hyperlipidemia   . Hypertension    "not anymore" (01/02/2014)  . Ischemic cardiomyopathy   . Kidney stones "years ago"   "passed them"  . Melanoma of ear ([place])    "right"  . Mild dementia ([place])   . Myocardial infarction ([place]) 1986; 1997  . Nocturia   . OAB (overactive bladder)   . Persistent atrial fibrillation   . Prostate cancer ([place]) 1998   S/P  [place]; [place]  . S/P CABG (coronary artery bypass graft)    [place]  . Sepsis due to urinary tract infection ([place])   . Urethral stricture   . UTI (urinary tract infection) 09/2015  . Wears hearing aid    bilateral  . Wears partial dentures    Past Surgical History:  Procedure Laterality Date  . CARDIAC CATHETERIZATION  02-22-2005  dr Vidal Schwalbe   mild to moderate lv dysfunction with inferobasilar akinesis/  totally occluded SVG to Intermediate Diagonal and SVG to PDA and RCA branches, totally occluded native coronary circulation with diffuse disease pLAD diagonal system with potentially could be ischemic/  patent SVG to OM with collaterals to dRCA and patent LIMA to LAD and diagonal systemss  .  CARDIAC CATHETERIZATION  08-28-2013  DR Daneen Schick   widely patent sequential left internal graft to the diagonal/LAD, widely patent SVG to OM with proximal 50% narrowing noted in the graft, total occlusion SVG's to RCA, RI, and the Diagonal/ LV dysfunction with inferobasal aneurysm and mid anterior wall region of akinesis/  overall EF 35-40%/  Total occlusion of the navtive circulation/  no significant change compared to 2006 cath  . CARDIOVASCULAR STRESS TEST  08-01-2011  dr Angelena Form   inferior scar and possible soft tissue attenuation with minimal peri-infarct ischemia, small region of anterior ischemia and scar/  LVEF 53% LV wall motion with inferior hypokinesis/ no significant  change from scan july 2011  . CATARACT EXTRACTION W/ INTRAOCULAR LENS  IMPLANT, BILATERAL Bilateral 2007  . CORONARY ARTERY BYPASS GRAFT  11/ [place]-- 6 vessel/  1997 Re-do 5 vessel  . CYSTOSCOPY WITH URETHRAL DILATATION N/A 12/25/2013   Procedure: CYSTOSCOPY WITH URETHRAL DILATATION, WITH BIOPSY;  Surgeon: Bernestine Amass, MD;  Location[facility];  Service: Urology;  Laterality: N/A;  . CYSTOSCOPY WITH URETHRAL DILATATION N/A 12/22/2014   Procedure: CYSTOSCOPY WITH URETHRAL DILATATION;  Surgeon: Rana Snare, MD;  [facility];  Service: Urology;  Laterality: N/A;  BALLOON DILATION CATHETER    . EUS  10/05/2011   Procedure: ESOPHAGEAL ENDOSCOPIC ULTRASOUND (EUS) RADIAL;  Surgeon: Arta Silence, MD;  [facility];  Service: Endoscopy;  Laterality: N/A;  . INSERTION OF SUPRAPUBIC CATHETER N/A 12/22/2014   Procedure: INSERTION OF SUPRAPUBIC CATHETER ;  Surgeon: Rana Snare, MD;  [facility];  Service: Urology;  Laterality: N/A;  . LAPAROSCOPIC CHOLECYSTECTOMY  12-23-2005  . LEFT HEART CATHETERIZATION WITH CORONARY/GRAFT ANGIOGRAM N/A 08/28/2013   Procedure: LEFT HEART CATHETERIZATION WITH Beatrix Fetters;  Surgeon: Sinclair Grooms, MD;  Location: [facility] CATH LAB;  Service: Cardiovascular;  Laterality: N/A;  . MELANOMA EXCISION  X 1   "ear"  . RADIOACTIVE PROSTATE SEED IMPLANTS  1998  . SKIN CANCER EXCISION  X 2   "top of head"  . TONSILLECTOMY AND ADENOIDECTOMY  1954     A IV Location/Drains/Wounds Patient Lines/Drains/Airways Status   Active Line/Drains/Airways    Name:   Placement date:   Placement time:   Site:   Days:   Peripheral IV 08/28/18 Right Wrist   08/28/18    1835    Wrist   less than 1   Suprapubic Catheter Latex 16 Fr.   03/06/18    0920    Latex   175          Intake/Output Last 24 hours No intake or output data in the 24 hours ending 08/28/18 1835  Labs/Imaging Results for orders placed or performed  during the hospital encounter of 08/28/18 (from the past 48 hour(s))  Basic metabolic panel     Status: Abnormal   Collection Time: 08/28/18  3:36 PM  Result Value Ref Range   Sodium 139 135 - 145 mmol/L   Potassium 4.1 3.5 - 5.1 mmol/L   Chloride 106 98 - 111 mmol/L   CO2 21 (L) 22 - 32 mmol/L   Glucose, Bld 110 (H) 70 - 99 mg/dL   BUN 44 (H) 8 - 23 mg/dL   Creatinine, Ser 3.28 (H) 0.61 - 1.24 mg/dL   Calcium 9.1 8.9 - 10.3 mg/dL   GFR calc non Af Amer 16 (L) >60 mL/min   GFR calc Af Amer 18 (L) >60  mL/min   Anion gap 12 5 - 15    Comment: Performed at [facility] [address], [place], [place] [address]  CBC     Status: Abnormal   Collection Time: 08/28/18  3:36 PM  Result Value Ref Range   WBC 8.4 4.0 - 10.5 K/uL   RBC 3.61 (L) 4.22 - 5.81 MIL/uL   Hemoglobin 10.9 (L) 13.0 - 17.0 g/dL   HCT 33.3 (L) 39.0 - 52.0 %   MCV 92.2 80.0 - 100.0 fL   MCH 30.2 26.0 - 34.0 pg   MCHC 32.7 30.0 - 36.0 g/dL   RDW 14.6 11.5 - 15.5 %   Platelets 129 (L) 150 - 400 K/uL    Comment: REPEATED TO VERIFY SPECIMEN CHECKED FOR CLOTS    nRBC 0.0 0.0 - 0.2 %    Comment: Performed at [facility], [place] [address] [place], [place] [address]  Brain natriuretic peptide     Status: Abnormal   Collection Time: 08/28/18  3:36 PM  Result Value Ref Range   B Natriuretic Peptide 441.0 (H) 0.0 - 100.0 pg/mL    Comment: Performed at [facility] [address] [place], [place] [address]  I-Stat Troponin, ED (not at [facility])     Status: Abnormal   Collection Time: 08/28/18  3:55 PM  Result Value Ref Range   Troponin i, poc 0.52 (HH) 0.00 - 0.08 ng/mL   Comment NOTIFIED PHYSICIAN    Comment 3            Comment: Due to the release kinetics of cTnI, a negative result within the first hours of the onset of symptoms does not rule out myocardial infarction with certainty. If myocardial infarction is still suspected, repeat the test at appropriate intervals.    Dg Chest 2 View  Result  Date: 08/28/2018 CLINICAL DATA:  Shortness of breath. EXAM: CHEST - 2 VIEW COMPARISON:  Radiographs of July 01, 2018. FINDINGS: Stable cardiomediastinal silhouette. Status post coronary bypass graft. No pneumothorax or pleural effusion is noted. Right lung is clear. Minimal left basilar subsegmental atelectasis or scarring is noted. Bony thorax is unremarkable. IMPRESSION: Minimal left basilar subsegmental atelectasis or scarring. Electronically Signed   By: Marijo Conception M.D.   On: 08/28/2018 16:01    Pending Labs Unresulted Labs (From admission, onward)    Start     Ordered   08/29/18 7510  Basic metabolic panel  Tomorrow morning,   R     08/28/18 1740   08/29/18 0500  CBC  Tomorrow morning,   R     08/28/18 1740   08/28/18 2200  Troponin I - Now Then Q6H  Now then every 6 hours,   R     08/28/18 1733   08/28/18 2200  Protime-INR  Once,   R     08/28/18 1738   08/28/18 1637  Urine culture  ONCE - STAT,   STAT     08/28/18 1636   08/28/18 1637  SARS Coronavirus 2 (CEPHEID - Performed in [place] hospital lab), [place]  (Asymptomatic Patients Labs)  Once,   R    Question:  Rule Out  Answer:  Yes   08/28/18 1636   08/28/18 1621  Urinalysis, Routine w reflex microscopic  Once,   R     08/28/18 1620          Vitals/Pain Today's Vitals   08/28/18 1527 08/28/18 1532 08/28/18 1648  BP: Marland Kitchen)  167/84    Pulse: 86    Resp: 18    Temp: 98.4 F (36.9 C)    TempSrc: Oral    SpO2: 100%    Weight:  74.4 kg   Height:  5\' 5"  (1.651 m)   PainSc:  0-No pain 0-No pain    Isolation Precautions No active isolations  Medications Medications  0.45 % sodium chloride infusion (has no administration in time range)  acetaminophen (TYLENOL) tablet 650 mg (has no administration in time range)    Or  acetaminophen (TYLENOL) suppository 650 mg (has no administration in time range)    Mobility walks with device     Focused Assessments    R Recommendations: See Admitting Provider  Note  Report given to:   Additional Notes:

## 2018-08-28 NOTE — ED Provider Notes (Signed)
[facility] EMERGENCY DEPARTMENT Provider Note   CSN: [id number] Arrival date & time: 08/28/18  1511    History   Chief Complaint Chief Complaint  Patient presents with  . Shortness of Breath    HPI Ronald [name] is a 85 y.o. male history of CKD, CAD, prostate cancer now has suprapubic catheter, here presenting with constellation of complaints.  Patient states that he has been weak and dizzy and lightheaded.  He also has some shortness of breath and his Foley has not been draining well for the last 3 days.  He went to [facility] urology earlier and had his catheter swapped out.  He states that there is some blood in his catheter which sometimes happens after it was changed out.  He denies any fevers or chills.  He was sent here after his urology visit for his other complaints.  Patient denies any recent travel or sick contacts.     The history is provided by the patient.    Past Medical History:  Diagnosis Date  . Arthritis    "joints ache" (01/02/2014)  . At risk for sleep apnea    STOP-BANG= 4    SENT TO PCP 12-23-2013  . Bladder calculi   . Bradycardia   . Chronic cystitis   . Chronic systolic CHF (congestive heart failure) (HCC)    a. EF improved to 50-55% in 2018, previously 35-40%  . CKD (chronic kidney disease), stage II   . Coronary artery disease CARDIOLOGIST-  DR Angelena Form   a. CAD s/p CABG in 1989. b. redo bypass in 1997. c. last cath 2015 - med rx.  . Diverticulosis   . GERD (gastroesophageal reflux disease)   . History of Bell's palsy    RIGHT SIDE-- NO RESIDUAL  . Hx of dizziness   . Hyperlipidemia   . Hypertension    "not anymore" (01/02/2014)  . Ischemic cardiomyopathy   . Kidney stones "years ago"   "passed them"  . Melanoma of ear ([place])    "right"  . Mild dementia ([place])   . Myocardial infarction ([place]) 1986; 1997  . Nocturia   . OAB (overactive bladder)   . Persistent atrial fibrillation   . Prostate cancer ([place]) 1998   S/P   [place]; [place]  . S/P CABG (coronary artery bypass graft)    [place]  . Sepsis due to urinary tract infection ([place])   . Urethral stricture   . UTI (urinary tract infection) 09/2015  . Wears hearing aid    bilateral  . Wears partial dentures     Patient Active Problem List   Diagnosis Date Noted  . UTI (urinary tract infection) 03/06/2018  . Chronic diastolic CHF (congestive heart failure) ([place]) 12/26/2017  . PAF (paroxysmal atrial fibrillation) ([place]) 12/26/2017  . Fever 05/11/2017  . AKI (acute kidney injury) ([place])   . CKD (chronic kidney disease), stage III ([place])   . Bacteremia due to Pseudomonas 01/21/2016  . Debility 01/21/2016  . History of Bell's palsy   . Gastroesophageal reflux disease without esophagitis   . Coronary artery disease involving coronary bypass graft of native heart without angina pectoris   . History of prostate cancer   . Suprapubic catheter ([place])   . Incontinence of feces   . Renal failure syndrome   . Bacteremia   . NSTEMI (non-ST elevated myocardial infarction) ([place]) 01/16/2016  . ARF (acute renal failure) ([place]) 10/13/2015  .  Influenza A 12/05/2014  . Urinary tract infection associated with cystostomy catheter ([place]) 10/03/2014  . Acute lower UTI 09/10/2014  . Bacteremia due to Enterococcus 01/22/2014  . Chronic systolic CHF (congestive heart failure) ([place]) 05-Jan-202015  . Urethral stricture 12/25/2013  . Bladder calculus 12/25/2013  . LV dysfunction 09/23/2013  . Unstable angina ([place]) 08/27/2013  . Bacteremia due to Escherichia coli 05/20/2012  . Urinary tract infection 09/15/2011  . Calculus of kidney 09/12/2011  . HYPERCHOLESTEROLEMIA 10/18/2007  . HTN (hypertension) 10/18/2007  . OSTEOARTHRITIS 02/07/2007  . BELL'S PALSY, RIGHT 10/26/2006  . Coronary atherosclerosis 10/26/2006  . PROSTATE CANCER, HX OF 10/26/2006  . NEPHROLITHIASIS, HX OF 10/26/2006    Past Surgical History:  Procedure  Laterality Date  . CARDIAC CATHETERIZATION  02-22-2005  dr Vidal Schwalbe   mild to moderate lv dysfunction with inferobasilar akinesis/  totally occluded SVG to Intermediate Diagonal and SVG to PDA and RCA branches, totally occluded native coronary circulation with diffuse disease pLAD diagonal system with potentially could be ischemic/  patent SVG to OM with collaterals to dRCA and patent LIMA to LAD and diagonal systemss  . CARDIAC CATHETERIZATION  08-28-2013  DR Daneen Schick   widely patent sequential left internal graft to the diagonal/LAD, widely patent SVG to OM with proximal 50% narrowing noted in the graft, total occlusion SVG's to RCA, RI, and the Diagonal/ LV dysfunction with inferobasal aneurysm and mid anterior wall region of akinesis/  overall EF 35-40%/  Total occlusion of the navtive circulation/  no significant change compared to 2006 cath  . CARDIOVASCULAR STRESS TEST  08-01-2011  dr Angelena Form   inferior scar and possible soft tissue attenuation with minimal peri-infarct ischemia, small region of anterior ischemia and scar/  LVEF 53% LV wall motion with inferior hypokinesis/ no significant change from scan july 2011  . CATARACT EXTRACTION W/ INTRAOCULAR LENS  IMPLANT, BILATERAL Bilateral 2007  . CORONARY ARTERY BYPASS GRAFT  11/ [place]-- 6 vessel/  1997 Re-do 5 vessel  . CYSTOSCOPY WITH URETHRAL DILATATION N/A 12/25/2013   Procedure: CYSTOSCOPY WITH URETHRAL DILATATION, WITH BIOPSY;  Surgeon: Bernestine Amass, MD;  [facility];  Service: Urology;  Laterality: N/A;  . CYSTOSCOPY WITH URETHRAL DILATATION N/A 12/22/2014   Procedure: CYSTOSCOPY WITH URETHRAL DILATATION;  Surgeon: Rana Snare, MD;  [facility];  Service: Urology;  Laterality: N/A;  BALLOON DILATION CATHETER    . EUS  10/05/2011   Procedure: ESOPHAGEAL ENDOSCOPIC ULTRASOUND (EUS) RADIAL;  Surgeon: Arta Silence, MD;  [facility];  Service: Endoscopy;  Laterality: N/A;  .  INSERTION OF SUPRAPUBIC CATHETER N/A 12/22/2014   Procedure: INSERTION OF SUPRAPUBIC CATHETER ;  Surgeon: Rana Snare, MD;  [facility];  Service: Urology;  Laterality: N/A;  . LAPAROSCOPIC CHOLECYSTECTOMY  12-23-2005  . LEFT HEART CATHETERIZATION WITH CORONARY/GRAFT ANGIOGRAM N/A 08/28/2013   Procedure: LEFT HEART CATHETERIZATION WITH Beatrix Fetters;  Surgeon: Sinclair Grooms, MD;  Location: [facility] LAB;  Service: Cardiovascular;  Laterality: N/A;  . MELANOMA EXCISION  X 1   "ear"  . RADIOACTIVE PROSTATE SEED IMPLANTS  1998  . SKIN CANCER EXCISION  X 2   "top of head"  . TONSILLECTOMY AND ADENOIDECTOMY  1954        Home Medications    Prior to Admission medications   Medication Sig Start Date End Date Taking? Authorizing Provider  apixaban (ELIQUIS) 5 MG TABS tablet Take 1 tablet (5 mg total)  by mouth 2 (two) times daily. 05/10/17  Yes Burnell Blanks, MD  atorvastatin (LIPITOR) 80 MG tablet Take 0.5 tablets (40 mg total) by mouth every evening. 05/24/17  Yes Marletta Lor, MD  Calcium Carbonate Antacid (TUMS PO) Take 3 tablets by mouth daily as needed (gas).   Yes [provider]  Multiple Vitamin (MULTIVITAMIN WITH MINERALS) TABS Take 1 tablet by mouth daily.   Yes [provider]  acetaminophen (TYLENOL) 500 MG tablet Take 500 mg by mouth every 6 (six) hours as needed for moderate pain or fever.     [provider]  ciprofloxacin (CIPRO) 500 MG tablet Take 1 tablet (500 mg total) by mouth 2 (two) times daily. Patient not taking: Reported on 08/28/2018 07/02/18   Mesner, Corene Cornea, MD  hydroxypropyl methylcellulose (ISOPTO TEARS) 2.5 % ophthalmic solution Place 1 drop into both eyes 2 (two) times daily.     [provider]  meclizine (ANTIVERT) 12.5 MG tablet Take 1 tablet (12.5 mg total) by mouth 3 (three) times daily as needed for dizziness. Patient not taking: Reported on 08/28/2018 06/11/18   Isaac Bliss, [name], MD   nitroGLYCERIN (NITROSTAT) 0.4 MG SL tablet Place 0.4 mg under the tongue every 5 (five) minutes as needed for chest pain.     [provider]  Polyethyl Glycol-Propyl Glycol (SYSTANE) 0.4-0.3 % SOLN Apply 1 drop to eye 3 (three) times daily.    [provider]  ranitidine (ZANTAC) 150 MG tablet Take 1 tablet (150 mg total) by mouth 2 (two) times daily. Patient not taking: Reported on 08/28/2018 05/18/16   Burnell Blanks, MD    Family History Family History  Problem Relation Age of Onset  . Heart disease Mother   . Diabetes Father   . Heart disease Brother     Social History Social History   Tobacco Use  . Smoking status: Former Smoker    Years: 40.00    Types: Cigars    Last attempt to quit: 12/24/1983    Years since quitting: 34.7  . Smokeless tobacco: Former Systems developer    Types: Chew    Quit date: 08/28/2013  . Tobacco comment: smoked a pipe  Substance Use Topics  . Alcohol use: No  . Drug use: No     Allergies   Pantoprazole; Nitrofurantoin; Sulfa antibiotics; Lidocaine; and Tape   Review of Systems Review of Systems  Respiratory: Positive for shortness of breath.   All other systems reviewed and are negative.    Physical Exam Updated Vital Signs BP (!) 167/84 (BP Location: Right Arm)   Pulse 86   Temp 98.4 F (36.9 C) (Oral)   Resp 18   Ht 5\' 5"  (1.651 m)   Wt 74.4 kg   SpO2 100%   BMI 27.29 kg/m   Physical Exam Vitals signs and nursing note reviewed.  HENT:     Head: Normocephalic.  Eyes:     Extraocular Movements: Extraocular movements intact.     Pupils: Pupils are equal, round, and reactive to light.  Neck:     Musculoskeletal: Normal range of motion and neck supple.  Cardiovascular:     Rate and Rhythm: Normal rate.  Pulmonary:     Comments: Slightly tachypneic, crackles bilateral bases  Abdominal:     General: Bowel sounds are normal.     Palpations: Abdomen is soft.     Comments: Suprapubic catheter is blood tinged    Skin:    General: Skin is warm.  Capillary Refill: Capillary refill takes less than 2 seconds.  Neurological:     General: No focal deficit present.     Mental Status: He is alert and oriented to person, place, and time.  Psychiatric:        Mood and Affect: Mood normal.        Behavior: Behavior normal.      ED Treatments / Results  Labs (all labs ordered are listed, but only abnormal results are displayed) Labs Reviewed  BASIC METABOLIC PANEL - Abnormal; Notable for the following components:      Result Value   CO2 21 (*)    Glucose, Bld 110 (*)    BUN 44 (*)    Creatinine, Ser 3.28 (*)    GFR calc non Af Amer 16 (*)    GFR calc Af Amer 18 (*)    All other components within normal limits  CBC - Abnormal; Notable for the following components:   RBC 3.61 (*)    Hemoglobin 10.9 (*)    HCT 33.3 (*)    Platelets 129 (*)    All other components within normal limits  BRAIN NATRIURETIC PEPTIDE - Abnormal; Notable for the following components:   B Natriuretic Peptide 441.0 (*)    All other components within normal limits  I-STAT TROPONIN, ED - Abnormal; Notable for the following components:   Troponin i, poc 0.52 (*)    All other components within normal limits  URINE CULTURE  SARS CORONAVIRUS 2 (HOSPITAL ORDER, [facility])  URINALYSIS, ROUTINE W REFLEX MICROSCOPIC  TROPONIN I  TROPONIN I  PROTIME-INR  BASIC METABOLIC PANEL  CBC    EKG EKG Interpretation  Date/Time:  Tuesday August 28 2018 15:29:04 EDT Ventricular Rate:  86 PR Interval:    QRS Duration: 104 QT Interval:  390 QTC Calculation: 466 R Axis:   -4 Text Interpretation:  Atrial flutter with variable A-V block Moderate voltage criteria for LVH, may be normal variant Nonspecific T wave abnormality Prolonged QT Abnormal ECG previous tracing showed sinus rhythm Confirmed by Wandra Arthurs [phone]) on 08/28/2018 3:51:31 PM Also confirmed by Wandra Arthurs [phone]), editor Philomena Doheny  [phone])  on 08/28/2018 4:07:21 PM   Radiology Dg Chest 2 View  Result Date: 08/28/2018 CLINICAL DATA:  Shortness of breath. EXAM: CHEST - 2 VIEW COMPARISON:  Radiographs of July 01, 2018. FINDINGS: Stable cardiomediastinal silhouette. Status post coronary bypass graft. No pneumothorax or pleural effusion is noted. Right lung is clear. Minimal left basilar subsegmental atelectasis or scarring is noted. Bony thorax is unremarkable. IMPRESSION: Minimal left basilar subsegmental atelectasis or scarring. Electronically Signed   By: Marijo Conception M.D.   On: 08/28/2018 16:01    Procedures Procedures (including critical care time)  CRITICAL CARE Performed by: Wandra Arthurs   Total critical care time: 30 minutes  Critical care time was exclusive of separately billable procedures and treating other patients.  Critical care was necessary to treat or prevent imminent or life-threatening deterioration.  Critical care was time spent personally by me on the following activities: development of treatment plan with patient and/or surrogate as well as nursing, discussions with consultants, evaluation of patient's response to treatment, examination of patient, obtaining history from patient or surrogate, ordering and performing treatments and interventions, ordering and review of laboratory studies, ordering and review of radiographic studies, pulse oximetry and re-evaluation of patient's condition.    Medications Ordered in ED Medications  0.45 % sodium chloride  infusion ( Intravenous New Bag/Given 08/28/18 1836)  acetaminophen (TYLENOL) tablet 650 mg (has no administration in time range)    Or  acetaminophen (TYLENOL) suppository 650 mg (has no administration in time range)     Initial Impression / Assessment and Plan / ED Course  I have reviewed the triage vital signs and the nursing notes.  Pertinent labs & imaging results that were available during my care of the patient were reviewed by me and  considered in my medical decision making (see chart for details).       AASIM RESTIVO is a 85 y.o. male here with SOB, foley not draining. Foley was replaced by urology this morning. Will send off UA and urine culture. Also hasn't been feeling well and has been short of breath. Consider ACS vs CHF exacerbation. Will get labs, BNP, CXR, UA.   6 pm Patient has acute renal failure with Cr 3.2. Likely from foley not draining. Trop 0.52 likely demand ischemia. CXR clear. I talked to cardiology PA regarding patient, who recommend trending troponin, hospitalist admission.    Final Clinical Impressions(s) / ED Diagnoses   Final diagnoses:  None    ED Discharge Orders    None       Drenda Freeze, MD 08/28/18 [phone]

## 2018-08-28 NOTE — Telephone Encounter (Signed)
Spoke with wife and patient going to ER.

## 2018-08-28 NOTE — H&P (Addendum)
History and Physical    Ronald [name] ATF:[id number] DOB: October 20, 1927 DOA: 08/28/2018  PCP: Isaac Bliss, [name], MD   Patient coming from: Home.  I have personally briefly reviewed patient's old medical records in [place]  Chief Complaint: Shortness of breath.  HPI: Ronald [name] is a 85 y.o. male with medical history significant of osteoarthritis, history of sleep apnea, history of chronic cystitis and urolithiasis, stage II CKD, GERD, CAD/CABG, history of chronic systolic CHF, but the last echo his EF normalized, history of bradycardia, GERD, Bell's palsy, hyperlipidemia, hypertension, melanoma of the right ear, mild dementia, persistent atrial fibrillation history of UTIs with with episode of sepsis who is coming to the emergency department with complaints of progressively worsening dyspnea since Friday or Thursday last week, associated with clear sputum production, lower extremity edema and orthopnea.  He denies chest pain, palpitations, he complains of occasional positional dizziness.  He complains his Foley catheter has not drained for the past 3 days, had mild abdominal pain and nausea.  However this was changed by urology earlier today.  He denies fever, chills, sore throat, rhinorrhea, wheezing, hemoptysis, diarrhea, constipation, melena or hematochezia.  ED Course: Initial vital signs temperature 98.4 F, pulse 86, respiration 18, blood pressure 167/84 mmHg and O2 sat 100% on nasal cannula oxygen.  Urinalysis still pending.  White count is 8.4, hemoglobin 10.9 g/dL and platelets 129.  I-STAT troponin was 0.52 ng/mL and BNP was 441.0 pg/mL.  BMP shows normal electrolytes except for a CO2 of 21.  Glucose 110, BUN 44 and creatinine 3.28 mg/dL.  Baseline creatinine has ranged from normal to recently 1.52mg /dL.  EKG show atrial flutter with any acute changes.  His chest radiograph shows minimal left subsegmental atelectasis and basal scarring.  Review of Systems: As per  HPI otherwise 10 point review of systems negative.   Past Medical History:  Diagnosis Date  . Arthritis    "joints ache" (01/02/2014)  . At risk for sleep apnea    STOP-BANG= 4    SENT TO PCP 12-23-2013  . Bladder calculi   . Bradycardia   . Chronic cystitis   . Chronic systolic CHF (congestive heart failure) (HCC)    a. EF improved to 50-55% in 2018, previously 35-40%  . CKD (chronic kidney disease), stage II   . Coronary artery disease CARDIOLOGIST-  DR Angelena Form   a. CAD s/p CABG in 1989. b. redo bypass in 1997. c. last cath 2015 - med rx.  . Diverticulosis   . GERD (gastroesophageal reflux disease)   . History of Bell's palsy    RIGHT SIDE-- NO RESIDUAL  . Hx of dizziness   . Hyperlipidemia   . Hypertension    "not anymore" (01/02/2014)  . Ischemic cardiomyopathy   . Kidney stones "years ago"   "passed them"  . Melanoma of ear ([place])    "right"  . Mild dementia ([place])   . Myocardial infarction ([place]) 1986; 1997  . Nocturia   . OAB (overactive bladder)   . Persistent atrial fibrillation   . Prostate cancer ([place]) 1998   S/P  [place]; [place]  . S/P CABG (coronary artery bypass graft)    [place]  . Sepsis due to urinary tract infection ([place])   . Urethral stricture   . UTI (urinary tract infection) 09/2015  . Wears hearing aid    bilateral  . Wears partial dentures  Past Surgical History:  Procedure Laterality Date  . CARDIAC CATHETERIZATION  02-22-2005  dr Vidal Schwalbe   mild to moderate lv dysfunction with inferobasilar akinesis/  totally occluded SVG to Intermediate Diagonal and SVG to PDA and RCA branches, totally occluded native coronary circulation with diffuse disease pLAD diagonal system with potentially could be ischemic/  patent SVG to OM with collaterals to dRCA and patent LIMA to LAD and diagonal systemss  . CARDIAC CATHETERIZATION  08-28-2013  DR Daneen Schick   widely patent sequential left internal graft to the  diagonal/LAD, widely patent SVG to OM with proximal 50% narrowing noted in the graft, total occlusion SVG's to RCA, RI, and the Diagonal/ LV dysfunction with inferobasal aneurysm and mid anterior wall region of akinesis/  overall EF 35-40%/  Total occlusion of the navtive circulation/  no significant change compared to 2006 cath  . CARDIOVASCULAR STRESS TEST  08-01-2011  dr Angelena Form   inferior scar and possible soft tissue attenuation with minimal peri-infarct ischemia, small region of anterior ischemia and scar/  LVEF 53% LV wall motion with inferior hypokinesis/ no significant change from scan july 2011  . CATARACT EXTRACTION W/ INTRAOCULAR LENS  IMPLANT, BILATERAL Bilateral 2007  . CORONARY ARTERY BYPASS GRAFT  11/ [place]-- 6 vessel/  1997 Re-do 5 vessel  . CYSTOSCOPY WITH URETHRAL DILATATION N/A 12/25/2013   Procedure: CYSTOSCOPY WITH URETHRAL DILATATION, WITH BIOPSY;  Surgeon: Bernestine Amass, MD;  [facility];  Service: Urology;  Laterality: N/A;  . CYSTOSCOPY WITH URETHRAL DILATATION N/A 12/22/2014   Procedure: CYSTOSCOPY WITH URETHRAL DILATATION;  Surgeon: Rana Snare, MD;  Location: [facility];  Service: Urology;  Laterality: N/A;  BALLOON DILATION CATHETER    . EUS  10/05/2011   Procedure: ESOPHAGEAL ENDOSCOPIC ULTRASOUND (EUS) RADIAL;  Surgeon: Arta Silence, MD;  Location: [facility];  Service: Endoscopy;  Laterality: N/A;  . INSERTION OF SUPRAPUBIC CATHETER N/A 12/22/2014   Procedure: INSERTION OF SUPRAPUBIC CATHETER ;  Surgeon: Rana Snare, MD;  Location: [facility];  Service: Urology;  Laterality: N/A;  . LAPAROSCOPIC CHOLECYSTECTOMY  12-23-2005  . LEFT HEART CATHETERIZATION WITH CORONARY/GRAFT ANGIOGRAM N/A 08/28/2013   Procedure: LEFT HEART CATHETERIZATION WITH Beatrix Fetters;  Surgeon: Sinclair Grooms, MD;  [facility];  Service: Cardiovascular;  Laterality: N/A;  . MELANOMA EXCISION  X 1   "ear"  . RADIOACTIVE PROSTATE  SEED IMPLANTS  1998  . SKIN CANCER EXCISION  X 2   "top of head"  . [place]     reports that he quit smoking about 34 years ago. His smoking use included cigars. He quit after 40.00 years of use. He quit smokeless tobacco use about 5 years ago.  His smokeless tobacco use included chew. He reports that he does not drink alcohol or use drugs.  Allergies  Allergen Reactions  . Pantoprazole Other (See Comments)    Headache and lightheaded  . Nitrofurantoin Hives    dizziness and lightheaded   . Sulfa Antibiotics Hives and Itching  . Lidocaine Swelling    Mouth swelling   . Tape Other (See Comments)    TAPE WILL TEAR THE SKIN!!!    Family History  Problem Relation Age of Onset  . Heart disease Mother   . Diabetes Father   . Heart disease Brother    Prior to Admission medications   Medication Sig Start Date End Date Taking? Authorizing Provider  apixaban (  ELIQUIS) 5 MG TABS tablet Take 1 tablet (5 mg total) by mouth 2 (two) times daily. 05/10/17  Yes Burnell Blanks, MD  atorvastatin (LIPITOR) 80 MG tablet Take 0.5 tablets (40 mg total) by mouth every evening. 05/24/17  Yes Marletta Lor, MD  Calcium Carbonate Antacid (TUMS PO) Take 3 tablets by mouth daily as needed (gas).   Yes [provider]  Multiple Vitamin (MULTIVITAMIN WITH MINERALS) TABS Take 1 tablet by mouth daily.   Yes [provider]  acetaminophen (TYLENOL) 500 MG tablet Take 500 mg by mouth every 6 (six) hours as needed for moderate pain or fever.     [provider]  ciprofloxacin (CIPRO) 500 MG tablet Take 1 tablet (500 mg total) by mouth 2 (two) times daily. Patient not taking: Reported on 08/28/2018 07/02/18   Mesner, Corene Cornea, MD  hydroxypropyl methylcellulose (ISOPTO TEARS) 2.5 % ophthalmic solution Place 1 drop into both eyes 2 (two) times daily.     [provider]  meclizine (ANTIVERT) 12.5 MG tablet Take 1 tablet (12.5 mg total) by mouth  3 (three) times daily as needed for dizziness. Patient not taking: Reported on 08/28/2018 06/11/18   Isaac Bliss, [name], MD  nitroGLYCERIN (NITROSTAT) 0.4 MG SL tablet Place 0.4 mg under the tongue every 5 (five) minutes as needed for chest pain.     [provider]  Polyethyl Glycol-Propyl Glycol (SYSTANE) 0.4-0.3 % SOLN Apply 1 drop to eye 3 (three) times daily.    [provider]  ranitidine (ZANTAC) 150 MG tablet Take 1 tablet (150 mg total) by mouth 2 (two) times daily. Patient not taking: Reported on 08/28/2018 05/18/16   Burnell Blanks, MD    Physical Exam: Vitals:   08/28/18 1527 08/28/18 1532 08/28/18 1924  BP: (!) 167/84  (!) 143/80  Pulse: 86  81  Resp: 18  14  Temp: 98.4 F (36.9 C)    TempSrc: Oral    SpO2: 100%  99%  Weight:  74.4 kg   Height:  5\' 5"  (1.651 m)     Constitutional: NAD, calm, comfortable Eyes: PERRL, lids and conjunctivae normal ENMT: Mucous membranes are mildly dry. Posterior pharynx clear of any exudate or lesions. Neck: normal, supple, no masses, no thyromegaly Respiratory: clear to auscultation bilaterally, no wheezing, no crackles. Normal respiratory effort. No accessory muscle use.  Cardiovascular: Irregularly irregular with controlled rate, no murmurs / rubs / gallops. No extremity edema. 2+ pedal pulses. No carotid bruits.  Abdomen: Soft, no tenderness, no masses palpated. No hepatosplenomegaly. Bowel sounds positive.  Musculoskeletal: no clubbing / cyanosis. Good ROM, no contractures. Normal muscle tone.  Skin: Multiple areas of ecchymosis, but particularly on his left scapular area. Neurologic: Mild weakness right facial nerve, otherwise CN 2-12 grossly intact. Sensation intact, DTR normal.  Mild global weakness. Psychiatric: Normal judgment and insight. Alert and oriented x 3. Normal mood.   Labs on Admission: I have personally reviewed following labs and imaging studies  CBC: Recent Labs  Lab 08/28/18 1536   WBC 8.4  HGB 10.9*  HCT 33.3*  MCV 92.2  PLT 259*   Basic Metabolic Panel: Recent Labs  Lab 08/28/18 1536  NA 139  K 4.1  CL 106  CO2 21*  GLUCOSE 110*  BUN 44*  CREATININE 3.28*  CALCIUM 9.1   GFR: Estimated Creatinine Clearance: 14.4 mL/min (A) (by C-G formula based on SCr of 3.28 mg/dL (H)). Liver Function Tests: No results for input(s): AST, ALT, ALKPHOS, BILITOT,  PROT, ALBUMIN in the last 168 hours. No results for input(s): LIPASE, AMYLASE in the last 168 hours. No results for input(s): AMMONIA in the last 168 hours. Coagulation Profile: No results for input(s): INR, PROTIME in the last 168 hours. Cardiac Enzymes: No results for input(s): CKTOTAL, CKMB, CKMBINDEX, TROPONINI in the last 168 hours. BNP (last 3 results) No results for input(s): PROBNP in the last 8760 hours. HbA1C: No results for input(s): HGBA1C in the last 72 hours. CBG: No results for input(s): GLUCAP in the last 168 hours. Lipid Profile: No results for input(s): CHOL, HDL, LDLCALC, TRIG, CHOLHDL, LDLDIRECT in the last 72 hours. Thyroid Function Tests: No results for input(s): TSH, T4TOTAL, FREET4, T3FREE, THYROIDAB in the last 72 hours. Anemia Panel: No results for input(s): VITAMINB12, FOLATE, FERRITIN, TIBC, IRON, RETICCTPCT in the last 72 hours. Urine analysis:    Component Value Date/Time   COLORURINE YELLOW 07/01/2018 2341   APPEARANCEUR CLOUDY (A) 07/01/2018 2341   LABSPEC 1.010 07/01/2018 2341   PHURINE 6.0 07/01/2018 2341   GLUCOSEU NEGATIVE 07/01/2018 2341   HGBUR LARGE (A) 07/01/2018 2341   BILIRUBINUR NEGATIVE 07/01/2018 2341   BILIRUBINUR neg 06/30/2014 1530   KETONESUR NEGATIVE 07/01/2018 2341   PROTEINUR 100 (A) 07/01/2018 2341   UROBILINOGEN 0.2 12/04/2014 2344   NITRITE NEGATIVE 07/01/2018 2341   LEUKOCYTESUR LARGE (A) 07/01/2018 2341    Radiological Exams on Admission: Dg Chest 2 View  Result Date: 08/28/2018 CLINICAL DATA:  Shortness of breath. EXAM: CHEST - 2 VIEW  COMPARISON:  Radiographs of July 01, 2018. FINDINGS: Stable cardiomediastinal silhouette. Status post coronary bypass graft. No pneumothorax or pleural effusion is noted. Right lung is clear. Minimal left basilar subsegmental atelectasis or scarring is noted. Bony thorax is unremarkable. IMPRESSION: Minimal left basilar subsegmental atelectasis or scarring. Electronically Signed   By: Marijo Conception M.D.   On: 08/28/2018 16:01   02/17/2017 echocardiogram  ------------------------------------------------------------------- LV EF: 50% -   55%  ------------------------------------------------------------------- Indications:      CHF - 428.0.  ------------------------------------------------------------------- History:   PMH:  Acute Kidney Injury. Sepsis. Fever.  Coronary artery disease.  PMH:  Prostate Cancer.  Risk factors: Hypertension.  ------------------------------------------------------------------- Study Conclusions  - Left ventricle: septal hypokinesis Moderate basal septal   hypertrophy. The cavity size was normal. Systolic function was   normal. The estimated ejection fraction was in the range of 50%   to 55%. Wall motion was normal; there were no regional wall   motion abnormalities. Left ventricular diastolic function   parameters were normal. - Left atrium: The atrium was mildly dilated. - Atrial septum: No defect or patent foramen ovale was identified.   EKG: Independently reviewed. Vent. rate 86 BPM PR interval * ms QRS duration 104 ms QT/QTc 390/466 ms P-R-T axes * -4 105 Atrial flutter with variable A-V block Moderate voltage criteria for LVH, may be normal variant Nonspecific T wave abnormality Prolonged QT Abnormal ECG  Assessment/Plan Principal Problem:   AKI (acute kidney injury) ([place]) Acidemia was due to obstruction. Foley catheter has been replaced. Admit to telemetry/inpatient. Continue gentle IV hydration. Monitor intake and output.  Follow-up renal function electrolytes.  Active Problems:   Normocytic anemia Monitor hematocrit and hemoglobin.    HYPERCHOLESTEROLEMIA Continue atorvastatin 40 mg every evening. Monitor LFTs periodically.    Coronary atherosclerosis Continue atorvastatin. On apixaban.    HTN (hypertension) Currently not on antihypertensives. Monitor blood pressure.    CKD (chronic kidney disease), stage III (HCC) Continue gentle and time-limited IV hydration.  Monitor intake and output. Follow-up renal function and electrolytes.    Elevated troponin Trend troponin level. Repeat EKG in the morning. Check echocardiogram in a.m.    Prolonged QT Avoid medications that prolong QT. Check magnesium level.    PAF (paroxysmal atrial fibrillation) (HCC) CHA?DS?-VASc Score of at least 5. Continue apixaban.    DVT prophylaxis: On apixaban. Code Status: Full code. Family Communication: Disposition Plan: Admit for gentle IV hydration, troponin level trending and echo.. Consults called: Admission status: Inpatient/telemetry.   Reubin Milan MD [facility]  08/28/2018, 7:30 PM   This document was prepared using Dragon voice recognition software may contain some unintended transcription errors.

## 2018-08-28 NOTE — Telephone Encounter (Signed)
Pts wife calling, pt present during call. Reports pt with worsening weakness, SOB, decreased energy , poor endurance past 2 weeks. Also states pts tongue "Slightly swollen and right side of cheek." States onset 2 weeks ago, was told to stop antibiotic which pt did. States has not resolved. States able to eat and drink as usual but reports at times difficult to swallow. Denies wheezing, states SOB mostly with exertion but very minimal exertion, "Can't do what he usually can daily."  Also states sleeping a lot during day. BP this am 160/85  HR 83  Temp98.5. TN advised ED disposition, attempted to call practice as wife hesitant to go to ED as she can not stay with pt., unable to reach practice. Assured wife TN would send to practice as wife and pt requesting DR. Hernandez's input. Care advise given, will go to ED if symptoms worsen. Please advise: QM [phone]  Reason for Disposition . [1] MODERATE difficulty breathing (e.g., speaks in phrases, SOB even at rest, pulse 100-120) AND [2] NEW-onset or WORSE than normal  Answer Assessment - Initial Assessment Questions 1. RESPIRATORY STATUS: "Describe your breathing?" (e.g., wheezing, shortness of breath, unable to speak, severe coughing)      Worsening SOB 2. ONSET: "When did this breathing problem begin?"     2 weeks ago 3. PATTERN "Does the difficult breathing come and go, or has it been constant since it started?"     Worse with exertion 4. SEVERITY: "How bad is your breathing?" (e.g., mild, moderate, severe)    - MILD: No SOB at rest, mild SOB with walking, speaks normally in sentences, can lay down, no retractions, pulse < 100.    - MODERATE: SOB at rest, SOB with minimal exertion and prefers to sit, cannot lie down flat, speaks in phrases, mild retractions, audible wheezing, pulse 100-120.    - SEVERE: Very SOB at rest, speaks in single words, struggling to breathe, sitting hunched forward, retractions, pulse > 120      MIld-Moderate at times,  varies 5. RECURRENT SYMPTOM: "Have you had difficulty breathing before?" If so, ask: "When was the last time?" and "What happened that time?"      H/O 6. CARDIAC HISTORY: "Do you have any history of heart disease?" (e.g., heart attack, angina, bypass surgery, angioplasty)     yes 7. LUNG HISTORY: "Do you have any history of lung disease?"  (e.g., pulmonary embolus, asthma, emphysema)     8. CAUSE: "What do you think is causing the breathing problem?" "MAybe heart"    9. OTHER SYMPTOMS: "Do you have any other symptoms? (e.g., dizziness, runny nose, cough, chest pain, fever)     No energy, tongue and side of mouth slightly swollen x 2 weeks  11. TRAVEL: "Have you traveled out of the country in the last month?" (e.g., travel history, exposures)       no  Protocols used: BREATHING DIFFICULTY-A-AH

## 2018-08-28 NOTE — ED Notes (Signed)
Patient transported to X-ray 

## 2018-08-29 ENCOUNTER — Other Ambulatory Visit: Payer: Self-pay

## 2018-08-29 ENCOUNTER — Encounter (HOSPITAL_COMMUNITY): Payer: Self-pay | Admitting: *Deleted

## 2018-08-29 ENCOUNTER — Inpatient Hospital Stay (HOSPITAL_COMMUNITY): Payer: Medicare Other

## 2018-08-29 ENCOUNTER — Ambulatory Visit (HOSPITAL_COMMUNITY): Payer: Medicare Other

## 2018-08-29 DIAGNOSIS — I340 Nonrheumatic mitral (valve) insufficiency: Secondary | ICD-10-CM

## 2018-08-29 LAB — BASIC METABOLIC PANEL
Anion gap: 13 (ref 5–15)
BUN: 41 mg/dL — ABNORMAL HIGH (ref 8–23)
CO2: 22 mmol/L (ref 22–32)
Calcium: 8.8 mg/dL — ABNORMAL LOW (ref 8.9–10.3)
Chloride: 106 mmol/L (ref 98–111)
Creatinine, Ser: 3.33 mg/dL — ABNORMAL HIGH (ref 0.61–1.24)
GFR calc Af Amer: 18 mL/min — ABNORMAL LOW (ref >60)
GFR calc non Af Amer: 16 mL/min — ABNORMAL LOW (ref >60)
Glucose, Bld: 137 mg/dL — ABNORMAL HIGH (ref 70–99)
Potassium: 3.2 mmol/L — ABNORMAL LOW (ref 3.5–5.1)
Sodium: 141 mmol/L (ref 135–145)

## 2018-08-29 LAB — CBC
HCT: 31.2 % — ABNORMAL LOW (ref 39.0–52.0)
Hemoglobin: 10.1 g/dL — ABNORMAL LOW (ref 13.0–17.0)
MCH: 29.9 pg (ref 26.0–34.0)
MCHC: 32.4 g/dL (ref 30.0–36.0)
MCV: 92.3 fL (ref 80.0–100.0)
Platelets: 115 10*3/uL — ABNORMAL LOW (ref 150–400)
RBC: 3.38 MIL/uL — ABNORMAL LOW (ref 4.22–5.81)
RDW: 14.6 % (ref 11.5–15.5)
WBC: 6.4 10*3/uL (ref 4.0–10.5)
nRBC: 0 % (ref 0.0–0.2)

## 2018-08-29 LAB — TROPONIN I: Troponin I: 0.47 ng/mL (ref <0.03)

## 2018-08-29 LAB — ECHOCARDIOGRAM COMPLETE
Height: 65 in
Weight: 2603.19 oz

## 2018-08-29 MED ORDER — POLYVINYL ALCOHOL 1.4 % OP SOLN
1.0000 [drp] | Freq: Three times a day (TID) | OPHTHALMIC | Status: DC
  Administered 2018-08-29 – 2018-09-01 (×10): 1 [drp] via OPHTHALMIC
  Filled 2018-08-29 (×2): qty 15

## 2018-08-29 MED ORDER — APIXABAN 2.5 MG PO TABS
2.5000 mg | ORAL_TABLET | Freq: Two times a day (BID) | ORAL | Status: DC
  Administered 2018-08-29 – 2018-09-01 (×6): 2.5 mg via ORAL
  Filled 2018-08-29 (×6): qty 1

## 2018-08-29 MED ORDER — SODIUM CHLORIDE 0.9 % IV SOLN
INTRAVENOUS | Status: AC
  Administered 2018-08-29: 19:00:00 via INTRAVENOUS

## 2018-08-29 MED ORDER — MAGNESIUM SULFATE 2 GM/50ML IV SOLN
2.0000 g | Freq: Once | INTRAVENOUS | Status: AC
  Administered 2018-08-29: 2 g via INTRAVENOUS
  Filled 2018-08-29: qty 50

## 2018-08-29 MED ORDER — POTASSIUM CHLORIDE 20 MEQ PO PACK
40.0000 meq | PACK | Freq: Two times a day (BID) | ORAL | Status: AC
  Administered 2018-08-29 (×2): 40 meq via ORAL
  Filled 2018-08-29 (×2): qty 2

## 2018-08-29 NOTE — Progress Notes (Signed)
New Admission Note:  Arrival Method: Via stretcher from ED Mental Orientation: Alert & Oriented x4 Telemetry: CCMD verified Assessment: Completed Skin: Refer to flowsheet IV: Right Wrist  Pain: 0/10 Safety Measures: Safety Fall Prevention Plan discussed with patient. Admission: Completed [facility] [place] Orientation: Patient has been orientated to the room, unit and the staff.  Orders have been reviewed and are being implemented. Will continue to monitor the patient. Call light has been placed within reach and bed alarm has been activated.   Vassie Moselle, RN  Phone Number: [phone]

## 2018-08-29 NOTE — Progress Notes (Signed)
  Echocardiogram 2D Echocardiogram has been performed.  Ronald [name] 08/29/2018, 10:57 AM

## 2018-08-29 NOTE — Discharge Instructions (Signed)

## 2018-08-29 NOTE — Progress Notes (Addendum)
Progress Note   Ronald [name] XIP:[id number] DOB: [birth year] DOA: 08/28/2018  PCP: Isaac Bliss, [name], MD   Patient coming from: Home.  I have personally briefly reviewed patient's old medical records in [employer]  Chief Complaint: Shortness of breath.  HPI: Ronald [name] is a 85 y.o. male with medical history significant of osteoarthritis, history of sleep apnea, history of chronic cystitis and urolithiasis, stage II CKD, GERD, CAD/CABG, history of chronic systolic CHF, but the last echo his EF normalized, history of bradycardia, GERD, Bell's palsy, hyperlipidemia, hypertension, melanoma of the right ear, mild dementia, persistent atrial fibrillation history of UTIs with with episode of sepsis who is coming to the emergency department with complaints of progressively worsening dyspnea since Friday or Thursday last week, associated with clear sputum production, lower extremity edema and orthopnea.  He denies chest pain, palpitations, he complains of occasional positional dizziness.  He complains his Foley catheter has not drained for the past 3 days, had mild abdominal pain and nausea.  However this was changed by urology earlier today.  He denies fever, chills, sore throat, rhinorrhea, wheezing, hemoptysis, diarrhea, constipation, melena or hematochezia. In the ED pt initial vital signs temperature 98.4 F, pulse 86, respiration 18, blood pressure 167/84 mmHg and O2 sat 100% on nasal cannula oxygen. White count is 8.4, hemoglobin 10.9 g/dL and platelets 129.  I-STAT troponin was 0.52 ng/mL and BNP was 441.0 pg/mL.  BMP shows normal electrolytes except for a CO2 of 21.  Glucose 110, BUN 44 and creatinine 3.28 mg/dL.  Baseline creatinine has ranged from normal to recently 1.52mg /dL.  EKG show atrial flutter with any acute changes.  His chest radiograph shows minimal left subsegmental atelectasis and basal scarring.  Review of Systems: As per HPI otherwise 10 point review of  systems negative.   Past Medical History:  Diagnosis Date   Arthritis    "joints ache" (01/02/2014)   At risk for sleep apnea    STOP-BANG= 4    SENT TO PCP 12-23-2013   Bladder calculi    Bradycardia    Chronic cystitis    Chronic systolic CHF (congestive heart failure) ([place])    a. EF improved to 50-55% in 2018, previously 35-40%   CKD (chronic kidney disease), stage II    Coronary artery disease CARDIOLOGIST-  DR Angelena Form   a. CAD s/p CABG in 1989. b. redo bypass in 1997. c. last cath 2015 - med rx.   Diverticulosis    GERD (gastroesophageal reflux disease)    History of Bell's palsy    RIGHT SIDE-- NO RESIDUAL   Hx of dizziness    Hyperlipidemia    Hypertension    "not anymore" (01/02/2014)   Ischemic cardiomyopathy    Kidney stones "years ago"   "passed them"   Melanoma of ear ([place])    "right"   Mild dementia ([place])    Myocardial infarction ([place]) 1986; 1997   Nocturia    OAB (overactive bladder)    Persistent atrial fibrillation    Prostate cancer ([place]) 1998   S/P  RADIACTIVE SEED IMPLANTS; UROLOGIST-  DR GRAPEY   S/P CABG (coronary artery bypass graft)    1989  X6  &  1998 X5   Sepsis due to urinary tract infection ([place])    Urethral stricture    UTI (urinary tract infection) 09/2015   Wears hearing aid    bilateral   Wears partial dentures     Past Surgical History:  Procedure  Laterality Date   CARDIAC CATHETERIZATION  02-22-2005  dr Vidal Schwalbe   mild to moderate lv dysfunction with inferobasilar akinesis/  totally occluded SVG to Intermediate Diagonal and SVG to PDA and RCA branches, totally occluded native coronary circulation with diffuse disease pLAD diagonal system with potentially could be ischemic/  patent SVG to OM with collaterals to dRCA and patent LIMA to LAD and diagonal systemss   CARDIAC CATHETERIZATION  08-28-2013  DR Daneen Schick   widely patent sequential left internal graft to the diagonal/LAD, widely patent SVG to OM  with proximal 50% narrowing noted in the graft, total occlusion SVG's to RCA, RI, and the Diagonal/ LV dysfunction with inferobasal aneurysm and mid anterior wall region of akinesis/  overall EF 35-40%/  Total occlusion of the navtive circulation/  no significant change compared to 2006 cath   CARDIOVASCULAR STRESS TEST  08-01-2011  dr Angelena Form   inferior scar and possible soft tissue attenuation with minimal peri-infarct ischemia, small region of anterior ischemia and scar/  LVEF 53% LV wall motion with inferior hypokinesis/ no significant change from scan july 2011   CATARACT EXTRACTION W/ INTRAOCULAR LENS  IMPLANT, BILATERAL Bilateral 2007   CORONARY ARTERY BYPASS GRAFT  11/ 1989  &  10/ 1997   1989-- 6 vessel/  1997 Re-do 5 vessel   CYSTOSCOPY WITH URETHRAL DILATATION N/A 12/25/2013   Procedure: CYSTOSCOPY WITH URETHRAL DILATATION, WITH BIOPSY;  Surgeon: Bernestine Amass, MD;  [facility];  Service: Urology;  Laterality: N/A;   CYSTOSCOPY WITH URETHRAL DILATATION N/A 12/22/2014   Procedure: CYSTOSCOPY WITH URETHRAL DILATATION;  Surgeon: Rana Snare, MD;  Location: [facility];  Service: Urology;  Laterality: N/A;  BALLOON DILATION CATHETER     EUS  10/05/2011   Procedure: ESOPHAGEAL ENDOSCOPIC ULTRASOUND (EUS) RADIAL;  Surgeon: Arta Silence, MD;  Location: [facility];  Service: Endoscopy;  Laterality: N/A;   INSERTION OF SUPRAPUBIC CATHETER N/A 12/22/2014   Procedure: INSERTION OF SUPRAPUBIC CATHETER ;  Surgeon: Rana Snare, MD;  Location: [facility];  Service: Urology;  Laterality: N/A;   LAPAROSCOPIC CHOLECYSTECTOMY  12-23-2005   LEFT HEART CATHETERIZATION WITH CORONARY/GRAFT ANGIOGRAM N/A 08/28/2013   Procedure: LEFT HEART CATHETERIZATION WITH Beatrix Fetters;  Surgeon: Sinclair Grooms, MD;  Location: [facility] CATH LAB;  Service: Cardiovascular;  Laterality: N/A;   MELANOMA EXCISION  X 1   "ear"   RADIOACTIVE PROSTATE SEED IMPLANTS  1998   SKIN CANCER  EXCISION  X 2   "top of head"   TONSILLECTOMY AND ADENOIDECTOMY  1954    Allergies  Allergen Reactions   Pantoprazole Other (See Comments)    Headache and lightheaded   Nitrofurantoin Hives    dizziness and lightheaded    Sulfa Antibiotics Hives and Itching   Lidocaine Swelling    Mouth swelling    Tape Other (See Comments)    TAPE WILL TEAR THE SKIN!!!    Family History  Problem Relation Age of Onset   Heart disease Mother    Diabetes Father    Heart disease Brother    Prior to Admission medications   Medication Sig Start Date End Date Taking? Authorizing Provider  apixaban (ELIQUIS) 5 MG TABS tablet Take 1 tablet (5 mg total) by mouth 2 (two) times daily. 05/10/17  Yes Burnell Blanks, MD  atorvastatin (LIPITOR) 80 MG tablet Take 0.5 tablets (40 mg total) by mouth every evening. 05/24/17  Yes Marletta Lor, MD  Calcium Carbonate Antacid (TUMS PO) Take 3  tablets by mouth daily as needed (gas).   Yes [provider]  Multiple Vitamin (MULTIVITAMIN WITH MINERALS) TABS Take 1 tablet by mouth daily.   Yes [provider]  acetaminophen (TYLENOL) 500 MG tablet Take 500 mg by mouth every 6 (six) hours as needed for moderate pain or fever.     [provider]  ciprofloxacin (CIPRO) 500 MG tablet Take 1 tablet (500 mg total) by mouth 2 (two) times daily. Patient not taking: Reported on 08/28/2018 07/02/18   Mesner, Corene Cornea, MD  hydroxypropyl methylcellulose (ISOPTO TEARS) 2.5 % ophthalmic solution Place 1 drop into both eyes 2 (two) times daily.     [provider]  meclizine (ANTIVERT) 12.5 MG tablet Take 1 tablet (12.5 mg total) by mouth 3 (three) times daily as needed for dizziness. Patient not taking: Reported on 08/28/2018 06/11/18   Isaac Bliss, [name], MD  nitroGLYCERIN (NITROSTAT) 0.4 MG SL tablet Place 0.4 mg under the tongue every 5 (five) minutes as needed for chest pain.     [provider]  Polyethyl  Glycol-Propyl Glycol (SYSTANE) 0.4-0.3 % SOLN Apply 1 drop to eye 3 (three) times daily.    [provider]  ranitidine (ZANTAC) 150 MG tablet Take 1 tablet (150 mg total) by mouth 2 (two) times daily. Patient not taking: Reported on 08/28/2018 05/18/16   Burnell Blanks, MD    Physical Exam: Vitals:   08/29/18 0005 08/29/18 0431 08/29/18 0450 08/29/18 0923  BP: (!) 170/80 (!) 110/33 127/85 123/73  Pulse: 76 80 71 87  Resp: 19 19 18 18   Temp: 97.9 F (36.6 C) (!) 97.5 F (36.4 C) 98.2 F (36.8 C) 98.2 F (36.8 C)  TempSrc: Oral Oral Oral Oral  SpO2: 100% 100% 98% 98%  Weight:  73.8 kg    Height:       General:  Pleasantly resting in bed, No acute distress. HEENT:  Normocephalic atraumatic.  Sclerae nonicteric, noninjected.  Extraocular movements intact bilaterally. Neck:  Without mass or deformity.  Trachea is midline. Lungs:  Clear to auscultate bilaterally without rhonchi, wheeze, or rales. Heart: Irregularly irregular rate and rhythm.  Without murmurs, rubs, or gallops. Abdomen:  Soft, nontender, nondistended.  Without guarding or rebound. Extremities: Without cyanosis, clubbing, 2+ edema, or obvious deformity. Vascular:  Dorsalis pedis and posterior tibial pulses palpable bilaterally. Skin:  Warm and dry, no erythema, no ulcerations.  Labs on Admission: I have personally reviewed following labs and imaging studies  CBC: Recent Labs  Lab 08/28/18 1536 08/29/18 0350  WBC 8.4 6.4  HGB 10.9* 10.1*  HCT 33.3* 31.2*  MCV 92.2 92.3  PLT 129* 941*   Basic Metabolic Panel: Recent Labs  Lab 08/28/18 1536 08/28/18 2211 08/29/18 0350  NA 139  --  141  K 4.1  --  3.2*  CL 106  --  106  CO2 21*  --  22  GLUCOSE 110*  --  137*  BUN 44*  --  41*  CREATININE 3.28*  --  3.33*  CALCIUM 9.1  --  8.8*  MG  --  2.1  --    GFR: Estimated Creatinine Clearance: 14.1 mL/min (A) (by C-G formula based on SCr of 3.33 mg/dL (H)). Liver Function Tests: No results for  input(s): AST, ALT, ALKPHOS, BILITOT, PROT, ALBUMIN in the last 168 hours. No results for input(s): LIPASE, AMYLASE in the last 168 hours. No results for input(s): AMMONIA in the last 168 hours. Coagulation Profile: Recent Labs  Lab  08/28/18 2211  INR 1.3*   Cardiac Enzymes: Recent Labs  Lab 08/28/18 2211 08/29/18 0350  TROPONINI 0.70* 0.47*   BNP (last 3 results) No results for input(s): PROBNP in the last 8760 hours. HbA1C: No results for input(s): HGBA1C in the last 72 hours. CBG: No results for input(s): GLUCAP in the last 168 hours. Lipid Profile: No results for input(s): CHOL, HDL, LDLCALC, TRIG, CHOLHDL, LDLDIRECT in the last 72 hours. Thyroid Function Tests: No results for input(s): TSH, T4TOTAL, FREET4, T3FREE, THYROIDAB in the last 72 hours. Anemia Panel: No results for input(s): VITAMINB12, FOLATE, FERRITIN, TIBC, IRON, RETICCTPCT in the last 72 hours. Urine analysis:    Component Value Date/Time   COLORURINE STRAW (A) 08/28/2018 1915   APPEARANCEUR CLEAR 08/28/2018 1915   LABSPEC 1.004 (L) 08/28/2018 1915   PHURINE 6.0 08/28/2018 1915   GLUCOSEU NEGATIVE 08/28/2018 1915   HGBUR LARGE (A) 08/28/2018 [place] NEGATIVE 08/28/2018 1915   BILIRUBINUR neg 06/30/2014 [place] 08/28/2018 1915   PROTEINUR NEGATIVE 08/28/2018 1915   UROBILINOGEN 0.2 12/04/2014 2344   NITRITE NEGATIVE 08/28/2018 1915   LEUKOCYTESUR LARGE (A) 08/28/2018 1915    Radiological Exams on Admission: Dg Chest 2 View  Result Date: 08/28/2018 CLINICAL DATA:  Shortness of breath. EXAM: CHEST - 2 VIEW COMPARISON:  Radiographs of July 01, 2018. FINDINGS: Stable cardiomediastinal silhouette. Status post coronary bypass graft. No pneumothorax or pleural effusion is noted. Right lung is clear. Minimal left basilar subsegmental atelectasis or scarring is noted. Bony thorax is unremarkable. IMPRESSION: Minimal left basilar subsegmental atelectasis or scarring. Electronically  Signed   By: Marijo Conception M.D.   On: 08/28/2018 16:01   Assessment/Plan Principal Problem:   AKI (acute kidney injury) ([place]) Acidemia was due to obstruction. Foley catheter has been replaced. Admit to telemetry/inpatient. Continue gentle IV hydration. Monitor intake and output. Follow-up renal function electrolytes.  Active Problems:  AKI on CKD (chronic kidney disease), stage III (HCC) POA, ongoing Continue gentle and time-limited IV hydration. Monitor intake and output. Fattening minimally worsening despite IV fluids overnight, continue to follow morning labs.  Elevated troponin, likely type II NSTEMI supply demand mismatch Continue to follow, patient has no current complaints of chest pain, shortness of breath, dyspnea with exertion or chest pressure Likely in the setting of AKI on CKD with concurrent dehydration Echocardiogram pending  Normocytic anemia likely anemia of chronic disease Asymptomatic, likely at baseline Follow am labs  Hyperlipidemia Continue atorvastatin 40 mg every evening.  Coronary atherosclerosis, history of Continue atorvastatin/statin  HTN (hypertension), reported Currently not on antihypertensives. Monitor blood pressure.  Prolonged QT Avoid medications that prolong QT. Check magnesium level.   PAF (paroxysmal atrial fibrillation) (HCC) CHADSVASC Score of at least 5. Continue apixaban - start lower dose given elevated Cr as above  DVT prophylaxis: On apixaban. Code Status: Full code. Family Communication: Disposition Plan: Admit for gentle IV hydration, troponin level trending and echo. Likely dispo in the next 24-48h pending improvement in symptoms and resolution of AKI Admission status: Inpatient/telemetry.  Little Ishikawa MD [facility]  08/29/2018, 3:44 PM   This document was prepared using Dragon voice recognition software may contain some unintended transcription errors.

## 2018-08-30 LAB — BASIC METABOLIC PANEL
Anion gap: 9 (ref 5–15)
BUN: 42 mg/dL — ABNORMAL HIGH (ref 8–23)
CO2: 20 mmol/L — ABNORMAL LOW (ref 22–32)
Calcium: 8.9 mg/dL (ref 8.9–10.3)
Chloride: 113 mmol/L — ABNORMAL HIGH (ref 98–111)
Creatinine, Ser: 3.46 mg/dL — ABNORMAL HIGH (ref 0.61–1.24)
GFR calc Af Amer: 17 mL/min — ABNORMAL LOW (ref >60)
GFR calc non Af Amer: 15 mL/min — ABNORMAL LOW (ref >60)
Glucose, Bld: 140 mg/dL — ABNORMAL HIGH (ref 70–99)
Potassium: 4.9 mmol/L (ref 3.5–5.1)
Sodium: 142 mmol/L (ref 135–145)

## 2018-08-30 LAB — CBC
HCT: 32.9 % — ABNORMAL LOW (ref 39.0–52.0)
Hemoglobin: 10.7 g/dL — ABNORMAL LOW (ref 13.0–17.0)
MCH: 29.6 pg (ref 26.0–34.0)
MCHC: 32.5 g/dL (ref 30.0–36.0)
MCV: 91.1 fL (ref 80.0–100.0)
Platelets: 118 10*3/uL — ABNORMAL LOW (ref 150–400)
RBC: 3.61 MIL/uL — ABNORMAL LOW (ref 4.22–5.81)
RDW: 14.6 % (ref 11.5–15.5)
WBC: 12.3 10*3/uL — ABNORMAL HIGH (ref 4.0–10.5)
nRBC: 0 % (ref 0.0–0.2)

## 2018-08-30 MED ORDER — SODIUM CHLORIDE 0.9 % IV SOLN
1.0000 g | INTRAVENOUS | Status: DC
  Administered 2018-08-30 – 2018-08-31 (×2): 1 g via INTRAVENOUS
  Filled 2018-08-30 (×3): qty 10

## 2018-08-30 MED ORDER — SODIUM CHLORIDE 0.9 % IV SOLN
INTRAVENOUS | Status: AC
  Administered 2018-08-30: 14:00:00 via INTRAVENOUS

## 2018-08-30 NOTE — Consult Note (Signed)
Ronald [name] Admit Date: 08/28/2018 08/30/2018 Ronald [name] Requesting Physician:  Ronald Gully MD  Reason for Consult:  Ao CKD HPI:  85 year old male with obstructive uropathy requiring chronic suprapubic catheter, CKD 3 with serum creatinine 1.1-1.5 who presented to the emergency room on 6/2 with complaint of dyspnea.  Earlier in the day he had exchange of his suprapubic catheter after at least 3 days without drainage at [facility] urology.  PMH Incudes:  OA, NSAIDs not on med list  OSA  GERD  CAD history CABG  Chronic systolic heart failure with LVEF of 40 to 45% by TTE yesterday  History of Bell's palsy  Hypertension, not on ACE/arm  Chart history of dementia  Atrial fibrillation on apixaban  Presenting serum creatinine was 3.28 and has progressed to 3.46 today.  Potassium and serum bicarbonate are stable.  BUN is 42.  UA on 6/2 notable for pyuria and some hematuria.  No nephrotoxin exposure such as NSAIDs or IV contrast.  Patient nonoliguric, is 2 L net negative from admission and urine output has been greater than 1.5 L.    Currently patient in no acute distress.  He has no specific complaints.  He is mildly confused.  Creatinine, Ser (mg/dL)  Date Value  08/30/2018 3.46 (H)  08/29/2018 3.33 (H)  08/28/2018 3.28 (H)  07/01/2018 1.52 (H)  06/04/2018 1.41 (H)  04/13/2018 1.14  03/08/2018 1.27 (H)  03/07/2018 1.28 (H)  03/05/2018 1.57 (H)  01/10/2018 1.14  ] I/Os: I/O last 3 completed shifts: In: 3290.5 [P.O.:1060; I.V.:2230.5] Out: 4800 [Urine:4800]   ROS NSAIDS: No exposure IV Contrast no exposure TMP/SMX no exposure Hypotension not present Balance of 12 systems is negative w/ exceptions as above  PMH  Past Medical History:  Diagnosis Date  . Arthritis    "joints ache" (01/02/2014)  . At risk for sleep apnea    STOP-BANG= 4    SENT TO PCP 12-23-2013  . Bladder calculi   . Bradycardia   . Chronic cystitis   . Chronic systolic CHF (congestive heart  failure) (HCC)    a. EF improved to 50-55% in 2018, previously 35-40%  . CKD (chronic kidney disease), stage II   . Coronary artery disease CARDIOLOGIST-  DR Angelena Form   a. CAD s/p CABG in 1989. b. redo bypass in 1997. c. last cath 2015 - med rx.  . Diverticulosis   . GERD (gastroesophageal reflux disease)   . History of Bell's palsy    RIGHT SIDE-- NO RESIDUAL  . Hx of dizziness   . Hyperlipidemia   . Hypertension    "not anymore" (01/02/2014)  . Ischemic cardiomyopathy   . Kidney stones "years ago"   "passed them"  . Melanoma of ear ([place])    "right"  . Mild dementia ([place])   . Myocardial infarction ([place]) 1986; 1997  . Nocturia   . OAB (overactive bladder)   . Persistent atrial fibrillation   . Prostate cancer ([place]) 1998   S/P  [place]; [place]  . S/P CABG (coronary artery bypass graft)    [place]  . Sepsis due to urinary tract infection ([place])   . Urethral stricture   . UTI (urinary tract infection) 09/2015  . Wears hearing aid    bilateral  . Wears partial dentures    PSH  Past Surgical History:  Procedure Laterality Date  . CARDIAC CATHETERIZATION  02-22-2005  dr Vidal Schwalbe   mild to moderate lv  dysfunction with inferobasilar akinesis/  totally occluded SVG to Intermediate Diagonal and SVG to PDA and RCA branches, totally occluded native coronary circulation with diffuse disease pLAD diagonal system with potentially could be ischemic/  patent SVG to OM with collaterals to dRCA and patent LIMA to LAD and diagonal systemss  . CARDIAC CATHETERIZATION  08-28-2013  DR Daneen Schick   widely patent sequential left internal graft to the diagonal/LAD, widely patent SVG to OM with proximal 50% narrowing noted in the graft, total occlusion SVG's to RCA, RI, and the Diagonal/ LV dysfunction with inferobasal aneurysm and mid anterior wall region of akinesis/  overall EF 35-40%/  Total occlusion of the navtive circulation/  no significant change  compared to 2006 cath  . CARDIOVASCULAR STRESS TEST  08-01-2011  dr Angelena Form   inferior scar and possible soft tissue attenuation with minimal peri-infarct ischemia, small region of anterior ischemia and scar/  LVEF 53% LV wall motion with inferior hypokinesis/ no significant change from scan july 2011  . CATARACT EXTRACTION W/ INTRAOCULAR LENS  IMPLANT, BILATERAL Bilateral 2007  . CORONARY ARTERY BYPASS GRAFT  11/ [place]-- 6 vessel/  1997 Re-do 5 vessel  . CYSTOSCOPY WITH URETHRAL DILATATION N/A 12/25/2013   Procedure: CYSTOSCOPY WITH URETHRAL DILATATION, WITH BIOPSY;  Surgeon: Bernestine Amass, MD;  [facility];  Service: Urology;  Laterality: N/A;  . CYSTOSCOPY WITH URETHRAL DILATATION N/A 12/22/2014   Procedure: CYSTOSCOPY WITH URETHRAL DILATATION;  Surgeon: Rana Snare, MD;  [facility];  Service: Urology;  Laterality: N/A;  BALLOON DILATION CATHETER    . EUS  10/05/2011   Procedure: ESOPHAGEAL ENDOSCOPIC ULTRASOUND (EUS) RADIAL;  Surgeon: Arta Silence, MD;  [facility];  Service: Endoscopy;  Laterality: N/A;  . INSERTION OF SUPRAPUBIC CATHETER N/A 12/22/2014   Procedure: INSERTION OF SUPRAPUBIC CATHETER ;  Surgeon: Rana Snare, MD;  Location: [facility];  Service: Urology;  Laterality: N/A;  . LAPAROSCOPIC CHOLECYSTECTOMY  12-23-2005  . LEFT HEART CATHETERIZATION WITH CORONARY/GRAFT ANGIOGRAM N/A 08/28/2013   Procedure: LEFT HEART CATHETERIZATION WITH Beatrix Fetters;  Surgeon: Sinclair Grooms, MD;  Location[facility] LAB;  Service: Cardiovascular;  Laterality: N/A;  . MELANOMA EXCISION  X 1   "ear"  . RADIOACTIVE PROSTATE SEED IMPLANTS  1998  . SKIN CANCER EXCISION  X 2   "top of head"  . TONSILLECTOMY AND ADENOIDECTOMY  1954   FH  Family History  Problem Relation Age of Onset  . Heart disease Mother   . Diabetes Father   . Heart disease Brother    SH  reports that he quit smoking about 34 years ago. His smoking use  included cigars. He quit after 40.00 years of use. He quit smokeless tobacco use about 5 years ago.  His smokeless tobacco use included chew. He reports that he does not drink alcohol or use drugs. Allergies  Allergies  Allergen Reactions  . Pantoprazole Other (See Comments)    Headache and lightheaded  . Nitrofurantoin Hives    dizziness and lightheaded   . Sulfa Antibiotics Hives and Itching  . Lidocaine Swelling    Mouth swelling   . Tape Other (See Comments)    TAPE WILL TEAR THE SKIN!!!   Home medications Prior to Admission medications   Medication Sig Start Date End Date Taking? Authorizing Provider  apixaban (ELIQUIS) 5 MG TABS tablet Take 1 tablet (5 mg total) by mouth 2 (two) times daily. 05/10/17  Yes Burnell Blanks, MD  atorvastatin (LIPITOR) 80 MG tablet Take 0.5 tablets (40 mg total) by mouth every evening. 05/24/17  Yes Marletta Lor, MD  Calcium Carbonate Antacid (TUMS PO) Take 3 tablets by mouth daily as needed (gas).   Yes [provider]  Multiple Vitamin (MULTIVITAMIN WITH MINERALS) TABS Take 1 tablet by mouth daily.   Yes [provider]  acetaminophen (TYLENOL) 500 MG tablet Take 500 mg by mouth every 6 (six) hours as needed for moderate pain or fever.     [provider]  ciprofloxacin (CIPRO) 500 MG tablet Take 1 tablet (500 mg total) by mouth 2 (two) times daily. Patient not taking: Reported on 08/28/2018 07/02/18   Mesner, Corene Cornea, MD  hydroxypropyl methylcellulose (ISOPTO TEARS) 2.5 % ophthalmic solution Place 1 drop into both eyes 2 (two) times daily.     [provider]  meclizine (ANTIVERT) 12.5 MG tablet Take 1 tablet (12.5 mg total) by mouth 3 (three) times daily as needed for dizziness. Patient not taking: Reported on 08/28/2018 06/11/18   Isaac Bliss, [name], MD  nitroGLYCERIN (NITROSTAT) 0.4 MG SL tablet Place 0.4 mg under the tongue every 5 (five) minutes as needed for chest pain.     [provider]  Polyethyl Glycol-Propyl Glycol (SYSTANE) 0.4-0.3 % SOLN Apply 1 drop to eye 3 (three) times daily.    [provider]  ranitidine (ZANTAC) 150 MG tablet Take 1 tablet (150 mg total) by mouth 2 (two) times daily. Patient not taking: Reported on 08/28/2018 05/18/16   Burnell Blanks, MD    Current Medications Scheduled Meds: . apixaban  2.5 mg Oral BID  . atorvastatin  40 mg Oral QPM  . famotidine  20 mg Oral Daily  . polyvinyl alcohol  1 drop Both Eyes TID   Continuous Infusions: . sodium chloride 125 mL/hr at 08/30/18 1415   PRN Meds:.acetaminophen **OR** acetaminophen, nitroGLYCERIN  CBC Recent Labs  Lab 08/28/18 1536 08/29/18 0350 08/30/18 1109  WBC 8.4 6.4 12.3*  HGB 10.9* 10.1* 10.7*  HCT 33.3* 31.2* 32.9*  MCV 92.2 92.3 91.1  PLT 129* 115* 242*   Basic Metabolic Panel Recent Labs  Lab 08/28/18 1536 08/29/18 0350 08/30/18 1109  NA 139 141 142  K 4.1 3.2* 4.9  CL 106 106 113*  CO2 21* 22 20*  GLUCOSE 110* 137* 140*  BUN 44* 41* 42*  CREATININE 3.28* 3.33* 3.46*  CALCIUM 9.1 8.8* 8.9    Physical Exam  Blood pressure (!) 167/81, pulse 93, temperature 98.5 F (36.9 C), temperature source Oral, resp. rate 20, height 5\' 5"  (1.651 m), weight 73.8 kg, SpO2 94 %. GEN: No acute distress, elderly male, conversant, mildly confused ENT: NCAT EYES: EOMI CV: RRR, normal S1 and S2, no rub PULM: Clear to auscultation, normal work of breathing while lying supine ABD: Suprapubic catheter present, some blood crusting surrounding the exit site, no erythema or purulent drainage; abdomen soft, nontender, nondistended SKIN: No rashes or lesions, as above EXT: No peripheral edema  Assessment 85 year old male with AoCKD3 after recent blockage of suprapubic catheter and exchange on 6/2.  Patient appears euvolemic, is with adequate urine output, and no significant hyperkalemia or acidosis.  I think that with time he will improve.  1. Nonoliguric AKI, likely  related to urinary obstruction; baseline CKD 3 creatinine 1.1-1.5 2. Chronic urinary obstruction with suprapubic Foley catheter, recently blocked for least 3 days and exchanged on 6/2 with urology 3. Hypertension, mildly elevated  4. A. fib on DOAC 5. Chronic systolic heart failure 6. OSA 7. Mild dementia  Plan 1. Continue strict I's and O's and hydration; daily renal panel 2. I expect him to recover in time  Pearson Grippe MD 08/30/2018, 4:04 PM

## 2018-08-30 NOTE — Progress Notes (Addendum)
Progress Note   Ronald [name] XHB:[id number] DOB: [birth year] DOA: 08/28/2018  PCP: Isaac Bliss, [name], MD   Patient coming from: Home.  I have personally briefly reviewed patient's old medical records in [place]  Chief Complaint: Shortness of breath.  HPI: Ronald [name] is a 85 y.o. male with medical history significant of osteoarthritis, history of sleep apnea, history of chronic cystitis and urolithiasis, stage II CKD, GERD, CAD/CABG, history of chronic systolic CHF, but the last echo his EF normalized, history of bradycardia, GERD, Bell's palsy, hyperlipidemia, hypertension, melanoma of the right ear, mild dementia, persistent atrial fibrillation history of UTIs with with episode of sepsis who is coming to the emergency department with complaints of progressively worsening dyspnea since Friday or Thursday last week, associated with clear sputum production, lower extremity edema and orthopnea.  He denies chest pain, palpitations, he complains of occasional positional dizziness.  He complains his Foley catheter has not drained for the past 3 days, had mild abdominal pain and nausea.  However this was changed by urology earlier today.  He denies fever, chills, sore throat, rhinorrhea, wheezing, hemoptysis, diarrhea, constipation, melena or hematochezia. In the ED pt initial vital signs temperature 98.4 F, pulse 86, respiration 18, blood pressure 167/84 mmHg and O2 sat 100% on nasal cannula oxygen. White count is 8.4, hemoglobin 10.9 g/dL and platelets 129.  I-STAT troponin was 0.52 ng/mL and BNP was 441.0 pg/mL.  BMP shows normal electrolytes except for a CO2 of 21.  Glucose 110, BUN 44 and creatinine 3.28 mg/dL.  Baseline creatinine has ranged from normal to recently 1.52mg /dL.  EKG show atrial flutter with any acute changes.  His chest radiograph shows minimal left subsegmental atelectasis and basal scarring.  Review of Systems: As per HPI otherwise 10 point review of  systems negative.   Past Medical History:  Diagnosis Date  . Arthritis    "joints ache" (01/02/2014)  . At risk for sleep apnea    STOP-BANG= 4    SENT TO PCP 12-23-2013  . Bladder calculi   . Bradycardia   . Chronic cystitis   . Chronic systolic CHF (congestive heart failure) (HCC)    a. EF improved to 50-55% in 2018, previously 35-40%  . CKD (chronic kidney disease), stage II   . Coronary artery disease CARDIOLOGIST-  DR Angelena Form   a. CAD s/p CABG in 1989. b. redo bypass in 1997. c. last cath 2015 - med rx.  . Diverticulosis   . GERD (gastroesophageal reflux disease)   . History of Bell's palsy    RIGHT SIDE-- NO RESIDUAL  . Hx of dizziness   . Hyperlipidemia   . Hypertension    "not anymore" (01/02/2014)  . Ischemic cardiomyopathy   . Kidney stones "years ago"   "passed them"  . Melanoma of ear ([place])    "right"  . Mild dementia ([place])   . Myocardial infarction ([place]) 1986; 1997  . Nocturia   . OAB (overactive bladder)   . Persistent atrial fibrillation   . Prostate cancer ([place]) 1998   S/P  [place]; [place]  . S/P CABG (coronary artery bypass graft)    [place]  . Sepsis due to urinary tract infection ([place])   . Urethral stricture   . UTI (urinary tract infection) 09/2015  . Wears hearing aid    bilateral  . Wears partial dentures     Past Surgical History:  Procedure  Laterality Date  . CARDIAC CATHETERIZATION  02-22-2005  dr Vidal Schwalbe   mild to moderate lv dysfunction with inferobasilar akinesis/  totally occluded SVG to Intermediate Diagonal and SVG to PDA and RCA branches, totally occluded native coronary circulation with diffuse disease pLAD diagonal system with potentially could be ischemic/  patent SVG to OM with collaterals to dRCA and patent LIMA to LAD and diagonal systemss  . CARDIAC CATHETERIZATION  08-28-2013  DR Daneen Schick   widely patent sequential left internal graft to the diagonal/LAD, widely patent SVG to OM  with proximal 50% narrowing noted in the graft, total occlusion SVG's to RCA, RI, and the Diagonal/ LV dysfunction with inferobasal aneurysm and mid anterior wall region of akinesis/  overall EF 35-40%/  Total occlusion of the navtive circulation/  no significant change compared to 2006 cath  . CARDIOVASCULAR STRESS TEST  08-01-2011  dr Angelena Form   inferior scar and possible soft tissue attenuation with minimal peri-infarct ischemia, small region of anterior ischemia and scar/  LVEF 53% LV wall motion with inferior hypokinesis/ no significant change from scan july 2011  . CATARACT EXTRACTION W/ INTRAOCULAR LENS  IMPLANT, BILATERAL Bilateral 2007  . CORONARY ARTERY BYPASS GRAFT  11/ Max-- 6 vessel/  1997 Re-do 5 vessel  . CYSTOSCOPY WITH URETHRAL DILATATION N/A 12/25/2013   Procedure: CYSTOSCOPY WITH URETHRAL DILATATION, WITH BIOPSY;  Surgeon: Bernestine Amass, MD;  Location: [facility];  Service: Urology;  Laterality: N/A;  . CYSTOSCOPY WITH URETHRAL DILATATION N/A 12/22/2014   Procedure: CYSTOSCOPY WITH URETHRAL DILATATION;  Surgeon: Rana Snare, MD;  Location: [facility];  Service: Urology;  Laterality: N/A;  BALLOON DILATION CATHETER    . EUS  10/05/2011   Procedure: ESOPHAGEAL ENDOSCOPIC ULTRASOUND (EUS) RADIAL;  Surgeon: Arta Silence, MD;  Location: [facility];  Service: Endoscopy;  Laterality: N/A;  . INSERTION OF SUPRAPUBIC CATHETER N/A 12/22/2014   Procedure: INSERTION OF SUPRAPUBIC CATHETER ;  Surgeon: Rana Snare, MD;  Location: [facility];  Service: Urology;  Laterality: N/A;  . LAPAROSCOPIC CHOLECYSTECTOMY  12-23-2005  . LEFT HEART CATHETERIZATION WITH CORONARY/GRAFT ANGIOGRAM N/A 08/28/2013   Procedure: LEFT HEART CATHETERIZATION WITH Beatrix Fetters;  Surgeon: Sinclair Grooms, MD;  Location[facility];  Service: Cardiovascular;  Laterality: N/A;  . MELANOMA EXCISION  X 1   "ear"  . RADIOACTIVE PROSTATE SEED IMPLANTS  1998  . SKIN CANCER  EXCISION  X 2   "top of head"  . TONSILLECTOMY AND ADENOIDECTOMY  1954    Allergies  Allergen Reactions  . Pantoprazole Other (See Comments)    Headache and lightheaded  . Nitrofurantoin Hives    dizziness and lightheaded   . Sulfa Antibiotics Hives and Itching  . Lidocaine Swelling    Mouth swelling   . Tape Other (See Comments)    TAPE WILL TEAR THE SKIN!!!    Family History  Problem Relation Age of Onset  . Heart disease Mother   . Diabetes Father   . Heart disease Brother    Prior to Admission medications   Medication Sig Start Date End Date Taking? Authorizing Provider  apixaban (ELIQUIS) 5 MG TABS tablet Take 1 tablet (5 mg total) by mouth 2 (two) times daily. 05/10/17  Yes Burnell Blanks, MD  atorvastatin (LIPITOR) 80 MG tablet Take 0.5 tablets (40 mg total) by mouth every evening. 05/24/17  Yes Marletta Lor, MD  Calcium Carbonate Antacid (TUMS PO) Take 3  tablets by mouth daily as needed (gas).   Yes [provider]  Multiple Vitamin (MULTIVITAMIN WITH MINERALS) TABS Take 1 tablet by mouth daily.   Yes [provider]  acetaminophen (TYLENOL) 500 MG tablet Take 500 mg by mouth every 6 (six) hours as needed for moderate pain or fever.     [provider]  ciprofloxacin (CIPRO) 500 MG tablet Take 1 tablet (500 mg total) by mouth 2 (two) times daily. Patient not taking: Reported on 08/28/2018 07/02/18   Mesner, Corene Cornea, MD  hydroxypropyl methylcellulose (ISOPTO TEARS) 2.5 % ophthalmic solution Place 1 drop into both eyes 2 (two) times daily.     [provider]  meclizine (ANTIVERT) 12.5 MG tablet Take 1 tablet (12.5 mg total) by mouth 3 (three) times daily as needed for dizziness. Patient not taking: Reported on 08/28/2018 06/11/18   Isaac Bliss, [name], MD  nitroGLYCERIN (NITROSTAT) 0.4 MG SL tablet Place 0.4 mg under the tongue every 5 (five) minutes as needed for chest pain.     [provider]  Polyethyl  Glycol-Propyl Glycol (SYSTANE) 0.4-0.3 % SOLN Apply 1 drop to eye 3 (three) times daily.    [provider]  ranitidine (ZANTAC) 150 MG tablet Take 1 tablet (150 mg total) by mouth 2 (two) times daily. Patient not taking: Reported on 08/28/2018 05/18/16   Burnell Blanks, MD    Physical Exam: Vitals:   08/29/18 1755 08/29/18 2014 08/30/18 0444 08/30/18 1010  BP: (!) 142/75 (!) 149/73 (!) 150/72 (!) 167/81  Pulse: 71 76 82 93  Resp: 18 18  20   Temp: 98.1 F (36.7 C) 98.3 F (36.8 C) 98.6 F (37 C) 98.5 F (36.9 C)  TempSrc: Oral Oral Oral Oral  SpO2: 97% 95% 90% 94%  Weight:      Height:       General:  Pleasantly resting in bed, No acute distress. HEENT:  Normocephalic atraumatic.  Sclerae nonicteric, noninjected.  Extraocular movements intact bilaterally. Neck:  Without mass or deformity.  Trachea is midline. Lungs:  Clear to auscultate bilaterally without rhonchi, wheeze, or rales. Heart: Irregularly irregular rate and rhythm.  Without murmurs, rubs, or gallops. Abdomen:  Soft, nontender, nondistended.  Without guarding or rebound. Extremities: Without cyanosis, clubbing, 2+ edema, or obvious deformity. Vascular:  Dorsalis pedis and posterior tibial pulses palpable bilaterally. Skin:  Warm and dry, no erythema, no ulcerations.  Labs on Admission: I have personally reviewed following labs and imaging studies  CBC: Recent Labs  Lab 08/28/18 1536 08/29/18 0350 08/30/18 1109  WBC 8.4 6.4 12.3*  HGB 10.9* 10.1* 10.7*  HCT 33.3* 31.2* 32.9*  MCV 92.2 92.3 91.1  PLT 129* 115* 250*   Basic Metabolic Panel: Recent Labs  Lab 08/28/18 1536 08/28/18 2211 08/29/18 0350 08/30/18 1109  NA 139  --  141 142  K 4.1  --  3.2* 4.9  CL 106  --  106 113*  CO2 21*  --  22 20*  GLUCOSE 110*  --  137* 140*  BUN 44*  --  41* 42*  CREATININE 3.28*  --  3.33* 3.46*  CALCIUM 9.1  --  8.8* 8.9  MG  --  2.1  --   --    GFR: Estimated Creatinine Clearance: 13.6 mL/min  (A) (by C-G formula based on SCr of 3.46 mg/dL (H)). Liver Function Tests: No results for input(s): AST, ALT, ALKPHOS, BILITOT, PROT, ALBUMIN in the last 168 hours. No results for input(s): LIPASE, AMYLASE in  the last 168 hours. No results for input(s): AMMONIA in the last 168 hours. Coagulation Profile: Recent Labs  Lab 08/28/18 2211  INR 1.3*   Cardiac Enzymes: Recent Labs  Lab 08/28/18 2211 08/29/18 0350  TROPONINI 0.70* 0.47*   BNP (last 3 results) No results for input(s): PROBNP in the last 8760 hours. HbA1C: No results for input(s): HGBA1C in the last 72 hours. CBG: No results for input(s): GLUCAP in the last 168 hours. Lipid Profile: No results for input(s): CHOL, HDL, LDLCALC, TRIG, CHOLHDL, LDLDIRECT in the last 72 hours. Thyroid Function Tests: No results for input(s): TSH, T4TOTAL, FREET4, T3FREE, THYROIDAB in the last 72 hours. Anemia Panel: No results for input(s): VITAMINB12, FOLATE, FERRITIN, TIBC, IRON, RETICCTPCT in the last 72 hours. Urine analysis:    Component Value Date/Time   COLORURINE STRAW (A) 08/28/2018 1915   APPEARANCEUR CLEAR 08/28/2018 1915   LABSPEC 1.004 (L) 08/28/2018 1915   PHURINE 6.0 08/28/2018 1915   GLUCOSEU NEGATIVE 08/28/2018 1915   HGBUR LARGE (A) 08/28/2018 [place] NEGATIVE 08/28/2018 1915   BILIRUBINUR neg 06/30/2014 [place] 08/28/2018 1915   PROTEINUR NEGATIVE 08/28/2018 1915   UROBILINOGEN 0.2 12/04/2014 2344   NITRITE NEGATIVE 08/28/2018 1915   LEUKOCYTESUR LARGE (A) 08/28/2018 1915    Radiological Exams on Admission: Dg Chest 2 View  Result Date: 08/28/2018 CLINICAL DATA:  Shortness of breath. EXAM: CHEST - 2 VIEW COMPARISON:  Radiographs of July 01, 2018. FINDINGS: Stable cardiomediastinal silhouette. Status post coronary bypass graft. No pneumothorax or pleural effusion is noted. Right lung is clear. Minimal left basilar subsegmental atelectasis or scarring is noted. Bony thorax is  unremarkable. IMPRESSION: Minimal left basilar subsegmental atelectasis or scarring. Electronically Signed   By: Marijo Conception M.D.   On: 08/28/2018 16:01   Assessment/Plan Principal Problem:   AKI (acute kidney injury) ([place]) Acidemia was due to obstruction. Foley catheter has been replaced. Admit to telemetry/inpatient. Continue gentle IV hydration. Monitor intake and output. Follow-up renal function electrolytes.  Active Problems:  AKI on CKD (chronic kidney disease), stage III (HCC) POA, worsening Patient continues to receive IV fluids, increased p.o. intake with worsening creatinine. Discussion today over the phone with patient's wife and son -questionable poor p.o. intake in the outpatient setting Nephrology consulted, appreciate insight and recommendations Foley catheter, chronic, without obvious obstructive uropathy  Abnormal urine culture - questionable UTI Patient declines any symptoms but urine collected at admission remarkable for staph aur. Growth - will administer ceftriaxone x3 doses - patient did have recent foley change so less likely colonization  Elevated troponin, likely type II NSTEMI supply demand mismatch Continue to follow, patient continues to deny chest pain, shortness of breath, dyspnea with exertion or chest pressure Likely in the setting of AKI on CKD with concurrent dehydration Echocardiogram remarkable for EF 40 to 45%, hypokinesis basal/mid inferoseptum - recommending difinity contrast study - this can probably be done in the outpatient setting  Normocytic anemia likely anemia of chronic disease Asymptomatic, likely at baseline Follow am labs  Hyperlipidemia Continue atorvastatin 40 mg every evening.  Coronary atherosclerosis, history of Continue atorvastatin/statin  HTN (hypertension), reported Currently not on antihypertensives. Monitor blood pressure.  Prolonged QT, minimal Avoid medications that prolong QT. QTc 460   PAF (paroxysmal  atrial fibrillation) (HCC) CHADSVASC Score of at least 5. Continue apixaban - start lower dose 2.5 given elevated Cr/depressed GFR as above  DVT prophylaxis: On apixaban. Code Status: Full code. Family Communication: Disposition Plan: Admit  for gentle IV hydration, troponin level trending and echo. Likely dispo in the next 24-48h pending improvement in symptoms and resolution of AKI - currently creatinine continues to uptrend despite fluids. Continue as inpatient to increase IV fluids and have further eval with nephro Admission status: Inpatient/telemetry.  Little Ishikawa MD [facility]  08/30/2018, 2:32 PM   This document was prepared using Dragon voice recognition software may contain some unintended transcription errors.

## 2018-08-31 LAB — BASIC METABOLIC PANEL
Anion gap: 8 (ref 5–15)
BUN: 39 mg/dL — ABNORMAL HIGH (ref 8–23)
CO2: 20 mmol/L — ABNORMAL LOW (ref 22–32)
Calcium: 8.7 mg/dL — ABNORMAL LOW (ref 8.9–10.3)
Chloride: 116 mmol/L — ABNORMAL HIGH (ref 98–111)
Creatinine, Ser: 3.5 mg/dL — ABNORMAL HIGH (ref 0.61–1.24)
GFR calc Af Amer: 17 mL/min — ABNORMAL LOW (ref >60)
GFR calc non Af Amer: 15 mL/min — ABNORMAL LOW (ref >60)
Glucose, Bld: 123 mg/dL — ABNORMAL HIGH (ref 70–99)
Potassium: 4.9 mmol/L (ref 3.5–5.1)
Sodium: 144 mmol/L (ref 135–145)

## 2018-08-31 LAB — CBC
HCT: 32 % — ABNORMAL LOW (ref 39.0–52.0)
Hemoglobin: 10.5 g/dL — ABNORMAL LOW (ref 13.0–17.0)
MCH: 30.1 pg (ref 26.0–34.0)
MCHC: 32.8 g/dL (ref 30.0–36.0)
MCV: 91.7 fL (ref 80.0–100.0)
Platelets: 112 10*3/uL — ABNORMAL LOW (ref 150–400)
RBC: 3.49 MIL/uL — ABNORMAL LOW (ref 4.22–5.81)
RDW: 14.7 % (ref 11.5–15.5)
WBC: 10 10*3/uL (ref 4.0–10.5)
nRBC: 0 % (ref 0.0–0.2)

## 2018-08-31 NOTE — Evaluation (Signed)
Physical Therapy Evaluation Patient Details Name: Ronald [name] MRN: [id number] DOB: 01/17/1928 Today's Date: 08/31/2018   History of Present Illness  Ronald [name] is a 85 y.o. male with medical history significant of osteoarthritis, history of sleep apnea, history of chronic cystitis and urolithiasis, stage II CKD, GERD, CAD/CABG, history of chronic systolic CHF, but the last echo his EF normalized, history of bradycardia, GERD, Bell's palsy, hyperlipidemia, hypertension, melanoma of the right ear, mild dementia, persistent atrial fibrillation history of UTIs with with episode of sepsis who is coming to the emergency department with complaints of progressively worsening dyspnea since Friday or Thursday last week, associated with clear sputum production, lower extremity edema and orthopnea.    Clinical Impression  Pt admitted with above diagnosis. Pt currently with functional limitations due to the deficits listed below (see PT Problem List). PTA pt living at home with wife, ambulating with RW. Today, contact guard for OOB, walking 30' with RW. Reports feeling weaker than baseline, discussed HHPT pt agreeable.  Pt will benefit from skilled PT to increase their independence and safety with mobility to allow discharge to the venue listed below.       Follow Up Recommendations Home health PT;Supervision for mobility/OOB    Equipment Recommendations  None recommended by PT    Recommendations for Other Services OT consult     Precautions / Restrictions Restrictions Weight Bearing Restrictions: No      Mobility  Bed Mobility Overal bed mobility: Modified Independent             General bed mobility comments: increased time and effort  Transfers Overall transfer level: Needs assistance Equipment used: Rolling walker (2 wheeled) Transfers: Sit to/from Stand Sit to Stand: Min guard         General transfer comment: min guard to stand, no LOB, good use of  RW  Ambulation/Gait Ambulation/Gait assistance: Min guard Gait Distance (Feet): 30 Feet Assistive device: Rolling walker (2 wheeled) Gait Pattern/deviations: Step-to pattern Gait velocity: decreased   General Gait Details: guard for safety, patient left in chair with chair alarm on. safe use of RW  Stairs            Wheelchair Mobility    Modified Rankin (Stroke Patients Only)       Balance Overall balance assessment: Needs assistance   Sitting balance-Leahy Scale: Fair       Standing balance-Leahy Scale: Fair                               Pertinent Vitals/Pain Pain Assessment: No/denies pain    Home Living Family/patient expects to be discharged to:: Private residence Living Arrangements: Spouse/significant other Available Help at Discharge: Family;Available 24 hours/day Type of Home: House Home Access: Stairs to enter Entrance Stairs-Rails: Left Entrance Stairs-Number of Steps: 3 Home Layout: One level Home Equipment: Walker - 2 wheels;Cane - single point      Prior Function Level of Independence: Independent with assistive device(s)         Comments: SPC for household distances     Hand Dominance   Dominant Hand: Right    Extremity/Trunk Assessment   Upper Extremity Assessment Upper Extremity Assessment: Overall WFL for tasks assessed    Lower Extremity Assessment Lower Extremity Assessment: Overall WFL for tasks assessed       Communication   Communication: HOH  Cognition Arousal/Alertness: Awake/alert Behavior During Therapy: WFL for tasks assessed/performed Overall Cognitive Status: Within  Functional Limits for tasks assessed                                 General Comments: mild dementia in chart. some inconsistent answers provided.       General Comments      Exercises     Assessment/Plan    PT Assessment Patient needs continued PT services  PT Problem List Decreased strength       PT  Treatment Interventions DME instruction;Gait training;Therapeutic activities;Functional mobility training;Therapeutic exercise    PT Goals (Current goals can be found in the Care Plan section)  Acute Rehab PT Goals Patient Stated Goal: go home PT Goal Formulation: With patient Time For Goal Achievement: 09/21/18 Potential to Achieve Goals: Good    Frequency Min 3X/week   Barriers to discharge        Co-evaluation               AM-PAC PT "6 Clicks" Mobility  Outcome Measure Help needed turning from your back to your side while in a flat bed without using bedrails?: None Help needed moving from lying on your back to sitting on the side of a flat bed without using bedrails?: None Help needed moving to and from a bed to a chair (including a wheelchair)?: A Little Help needed standing up from a chair using your arms (e.g., wheelchair or bedside chair)?: A Little Help needed to walk in hospital room?: A Little Help needed climbing 3-5 steps with a railing? : A Lot 6 Click Score: 19    End of Session Equipment Utilized During Treatment: Gait belt Activity Tolerance: Patient tolerated treatment well Patient left: in bed Nurse Communication: Mobility status PT Visit Diagnosis: Unsteadiness on feet (R26.81)    Time: 1425-1450 PT Time Calculation (min) (ACUTE ONLY): 25 min   Charges:   PT Evaluation $PT Eval Moderate Complexity: 1 Mod PT Treatments $Gait Training: 8-22 mins       Reinaldo Berber, PT, DPT Acute Rehabilitation Services Pager: [phone] Office: [place] 08/31/2018, 3:25 PM

## 2018-08-31 NOTE — Care Management Important Message (Signed)
Important Message  Patient Details  Name: Ronald [name] MRN: [id number] Date of Birth: 09-17-1927   Medicare Important Message Given:       [name] 08/31/2018, 3:06 PM

## 2018-08-31 NOTE — Progress Notes (Signed)
Progress Note   Ronald [name]:[id number] DOB: 09-10-1927 DOA: 08/28/2018  PCP: Isaac Bliss, [name], MD   Patient coming from: Home.  I have personally briefly reviewed patient's old medical records in [place]  Chief Complaint: Shortness of breath.  HPI: Ronald [name] is a 85 y.o. male with medical history significant of osteoarthritis, history of sleep apnea, history of chronic cystitis and urolithiasis, stage II CKD, GERD, CAD/CABG, history of chronic systolic CHF, but the last echo his EF normalized, history of bradycardia, GERD, Bell's palsy, hyperlipidemia, hypertension, melanoma of the right ear, mild dementia, persistent atrial fibrillation history of UTIs with with episode of sepsis who is coming to the emergency department with complaints of progressively worsening dyspnea since Friday or Thursday last week, associated with clear sputum production, lower extremity edema and orthopnea.  He denies chest pain, palpitations, he complains of occasional positional dizziness.  He complains his Foley catheter has not drained for the past 3 days, had mild abdominal pain and nausea.  However this was changed by urology earlier today.  He denies fever, chills, sore throat, rhinorrhea, wheezing, hemoptysis, diarrhea, constipation, melena or hematochezia. In the ED pt initial vital signs temperature 98.4 F, pulse 86, respiration 18, blood pressure 167/84 mmHg and O2 sat 100% on nasal cannula oxygen. White count is 8.4, hemoglobin 10.9 g/dL and platelets 129.  I-STAT troponin was 0.52 ng/mL and BNP was 441.0 pg/mL.  BMP shows normal electrolytes except for a CO2 of 21.  Glucose 110, BUN 44 and creatinine 3.28 mg/dL.  Baseline creatinine has ranged from normal to recently 1.52mg /dL.  EKG show atrial flutter with any acute changes.  His chest radiograph shows minimal left subsegmental atelectasis and basal scarring.  Review of Systems: As per HPI otherwise 10 point review of  systems negative.   Past Medical History:  Diagnosis Date  . Arthritis    "joints ache" (01/02/2014)  . At risk for sleep apnea    STOP-BANG= 4    SENT TO PCP 12-23-2013  . Bladder calculi   . Bradycardia   . Chronic cystitis   . Chronic systolic CHF (congestive heart failure) (HCC)    a. EF improved to 50-55% in 2018, previously 35-40%  . CKD (chronic kidney disease), stage II   . Coronary artery disease CARDIOLOGIST-  DR Angelena Form   a. CAD s/p CABG in 1989. b. redo bypass in 1997. c. last cath 2015 - med rx.  . Diverticulosis   . GERD (gastroesophageal reflux disease)   . History of Bell's palsy    RIGHT SIDE-- NO RESIDUAL  . Hx of dizziness   . Hyperlipidemia   . Hypertension    "not anymore" (01/02/2014)  . Ischemic cardiomyopathy   . Kidney stones "years ago"   "passed them"  . Melanoma of ear ([place])    "right"  . Mild dementia ([place])   . Myocardial infarction ([place]) 1986; 1997  . Nocturia   . OAB (overactive bladder)   . Persistent atrial fibrillation   . Prostate cancer ([place]) 1998   S/P  [place]; [place]  . S/P CABG (coronary artery bypass graft)    [place]  . Sepsis due to urinary tract infection ([place])   . Urethral stricture   . UTI (urinary tract infection) 09/2015  . Wears hearing aid    bilateral  . Wears partial dentures     Past Surgical History:  Procedure  Laterality Date  . CARDIAC CATHETERIZATION  02-22-2005  dr Vidal Schwalbe   mild to moderate lv dysfunction with inferobasilar akinesis/  totally occluded SVG to Intermediate Diagonal and SVG to PDA and RCA branches, totally occluded native coronary circulation with diffuse disease pLAD diagonal system with potentially could be ischemic/  patent SVG to OM with collaterals to dRCA and patent LIMA to LAD and diagonal systemss  . CARDIAC CATHETERIZATION  08-28-2013  DR Daneen Schick   widely patent sequential left internal graft to the diagonal/LAD, widely patent SVG to OM  with proximal 50% narrowing noted in the graft, total occlusion SVG's to RCA, RI, and the Diagonal/ LV dysfunction with inferobasal aneurysm and mid anterior wall region of akinesis/  overall EF 35-40%/  Total occlusion of the navtive circulation/  no significant change compared to 2006 cath  . CARDIOVASCULAR STRESS TEST  08-01-2011  dr Angelena Form   inferior scar and possible soft tissue attenuation with minimal peri-infarct ischemia, small region of anterior ischemia and scar/  LVEF 53% LV wall motion with inferior hypokinesis/ no significant change from scan july 2011  . CATARACT EXTRACTION W/ INTRAOCULAR LENS  IMPLANT, BILATERAL Bilateral 2007  . CORONARY ARTERY BYPASS GRAFT  11/ [place]-- 6 vessel/  1997 Re-do 5 vessel  . CYSTOSCOPY WITH URETHRAL DILATATION N/A 12/25/2013   Procedure: CYSTOSCOPY WITH URETHRAL DILATATION, WITH BIOPSY;  Surgeon: Bernestine Amass, MD;  [facility];  Service: Urology;  Laterality: N/A;  . CYSTOSCOPY WITH URETHRAL DILATATION N/A 12/22/2014   Procedure: CYSTOSCOPY WITH URETHRAL DILATATION;  Surgeon: Rana Snare, MD;  Location: [facility];  Service: Urology;  Laterality: N/A;  BALLOON DILATION CATHETER    . EUS  10/05/2011   Procedure: ESOPHAGEAL ENDOSCOPIC ULTRASOUND (EUS) RADIAL;  Surgeon: Arta Silence, MD;  Location: [facility];  Service: Endoscopy;  Laterality: N/A;  . INSERTION OF SUPRAPUBIC CATHETER N/A 12/22/2014   Procedure: INSERTION OF SUPRAPUBIC CATHETER ;  Surgeon: Rana Snare, MD;  Location: [facility];  Service: Urology;  Laterality: N/A;  . LAPAROSCOPIC CHOLECYSTECTOMY  12-23-2005  . LEFT HEART CATHETERIZATION WITH CORONARY/GRAFT ANGIOGRAM N/A 08/28/2013   Procedure: LEFT HEART CATHETERIZATION WITH Beatrix Fetters;  Surgeon: Sinclair Grooms, MD;  [facility] C[facility] LAB;  Service: Cardiovascular;  Laterality: N/A;  . MELANOMA EXCISION  X 1   "ear"  . RADIOACTIVE PROSTATE SEED IMPLANTS  1998  . SKIN CANCER  EXCISION  X 2   "top of head"  . TONSILLECTOMY AND ADENOIDECTOMY  1954    Allergies  Allergen Reactions  . Pantoprazole Other (See Comments)    Headache and lightheaded  . Nitrofurantoin Hives    dizziness and lightheaded   . Sulfa Antibiotics Hives and Itching  . Lidocaine Swelling    Mouth swelling   . Tape Other (See Comments)    TAPE WILL TEAR THE SKIN!!!    Family History  Problem Relation Age of Onset  . Heart disease Mother   . Diabetes Father   . Heart disease Brother    Prior to Admission medications   Medication Sig Start Date End Date Taking? Authorizing Provider  apixaban (ELIQUIS) 5 MG TABS tablet Take 1 tablet (5 mg total) by mouth 2 (two) times daily. 05/10/17  Yes Burnell Blanks, MD  atorvastatin (LIPITOR) 80 MG tablet Take 0.5 tablets (40 mg total) by mouth every evening. 05/24/17  Yes Marletta Lor, MD  Calcium Carbonate Antacid (TUMS PO) Take 3  tablets by mouth daily as needed (gas).   Yes [provider]  Multiple Vitamin (MULTIVITAMIN WITH MINERALS) TABS Take 1 tablet by mouth daily.   Yes [provider]  acetaminophen (TYLENOL) 500 MG tablet Take 500 mg by mouth every 6 (six) hours as needed for moderate pain or fever.     [provider]  ciprofloxacin (CIPRO) 500 MG tablet Take 1 tablet (500 mg total) by mouth 2 (two) times daily. Patient not taking: Reported on 08/28/2018 07/02/18   Mesner, Corene Cornea, MD  hydroxypropyl methylcellulose (ISOPTO TEARS) 2.5 % ophthalmic solution Place 1 drop into both eyes 2 (two) times daily.     [provider]  meclizine (ANTIVERT) 12.5 MG tablet Take 1 tablet (12.5 mg total) by mouth 3 (three) times daily as needed for dizziness. Patient not taking: Reported on 08/28/2018 06/11/18   Isaac Bliss, [name], MD  nitroGLYCERIN (NITROSTAT) 0.4 MG SL tablet Place 0.4 mg under the tongue every 5 (five) minutes as needed for chest pain.     [provider]  Polyethyl  Glycol-Propyl Glycol (SYSTANE) 0.4-0.3 % SOLN Apply 1 drop to eye 3 (three) times daily.    [provider]  ranitidine (ZANTAC) 150 MG tablet Take 1 tablet (150 mg total) by mouth 2 (two) times daily. Patient not taking: Reported on 08/28/2018 05/18/16   Burnell Blanks, MD    Physical Exam: Vitals:   08/30/18 1708 08/30/18 1959 08/31/18 0403 08/31/18 0924  BP: (!) 162/84 137/67 (!) 149/71 140/77  Pulse: 87 94 75 80  Resp: (!) 21     Temp: 98 F (36.7 C) 99 F (37.2 C) 97.8 F (36.6 C) 97.8 F (36.6 C)  TempSrc: Oral Oral Oral Oral  SpO2: 96% 93% 100% 100%  Weight:  72.1 kg    Height:       General:  Pleasantly resting in bed, No acute distress. HEENT:  Normocephalic atraumatic.  Sclerae nonicteric, noninjected.  Extraocular movements intact bilaterally. Neck:  Without mass or deformity.  Trachea is midline. Lungs:  Clear to auscultate bilaterally without rhonchi, wheeze, or rales. Heart: Irregularly irregular rate and rhythm.  Without murmurs, rubs, or gallops. Abdomen:  Soft, nontender, nondistended.  Without guarding or rebound. Extremities: Without cyanosis, clubbing, 2+ edema, or obvious deformity. Vascular:  Dorsalis pedis and posterior tibial pulses palpable bilaterally. Skin:  Warm and dry, no erythema, no ulcerations.  Labs on Admission: I have personally reviewed following labs and imaging studies  CBC: Recent Labs  Lab 08/28/18 1536 08/29/18 0350 08/30/18 1109 08/31/18 0215  WBC 8.4 6.4 12.3* 10.0  HGB 10.9* 10.1* 10.7* 10.5*  HCT 33.3* 31.2* 32.9* 32.0*  MCV 92.2 92.3 91.1 91.7  PLT 129* 115* 118* 384*   Basic Metabolic Panel: Recent Labs  Lab 08/28/18 1536 08/28/18 2211 08/29/18 0350 08/30/18 1109 08/31/18 0215  NA 139  --  141 142 144  K 4.1  --  3.2* 4.9 4.9  CL 106  --  106 113* 116*  CO2 21*  --  22 20* 20*  GLUCOSE 110*  --  137* 140* 123*  BUN 44*  --  41* 42* 39*  CREATININE 3.28*  --  3.33* 3.46* 3.50*  CALCIUM 9.1  --   8.8* 8.9 8.7*  MG  --  2.1  --   --   --    GFR: Estimated Creatinine Clearance: 12.4 mL/min (A) (by C-G formula based on SCr of 3.5 mg/dL (H)). Liver Function Tests: No results  for input(s): AST, ALT, ALKPHOS, BILITOT, PROT, ALBUMIN in the last 168 hours. No results for input(s): LIPASE, AMYLASE in the last 168 hours. No results for input(s): AMMONIA in the last 168 hours. Coagulation Profile: Recent Labs  Lab 08/28/18 2211  INR 1.3*   Cardiac Enzymes: Recent Labs  Lab 08/28/18 2211 08/29/18 0350  TROPONINI 0.70* 0.47*   BNP (last 3 results) No results for input(s): PROBNP in the last 8760 hours. HbA1C: No results for input(s): HGBA1C in the last 72 hours. CBG: No results for input(s): GLUCAP in the last 168 hours. Lipid Profile: No results for input(s): CHOL, HDL, LDLCALC, TRIG, CHOLHDL, LDLDIRECT in the last 72 hours. Thyroid Function Tests: No results for input(s): TSH, T4TOTAL, FREET4, T3FREE, THYROIDAB in the last 72 hours. Anemia Panel: No results for input(s): VITAMINB12, FOLATE, FERRITIN, TIBC, IRON, RETICCTPCT in the last 72 hours. Urine analysis:    Component Value Date/Time   COLORURINE STRAW (A) 08/28/2018 1915   APPEARANCEUR CLEAR 08/28/2018 1915   LABSPEC 1.004 (L) 08/28/2018 1915   PHURINE 6.0 08/28/2018 1915   GLUCOSEU NEGATIVE 08/28/2018 1915   HGBUR LARGE (A) 08/28/2018 [place] NEGATIVE 08/28/2018 1915   BILIRUBINUR neg 06/30/2014 [place] 08/28/2018 1915   PROTEINUR NEGATIVE 08/28/2018 1915   UROBILINOGEN 0.2 12/04/2014 2344   NITRITE NEGATIVE 08/28/2018 1915   LEUKOCYTESUR LARGE (A) 08/28/2018 1915    Radiological Exams on Admission: No results found. Assessment/Plan Principal Problem:   AKI (acute kidney injury) ([place]) Acidemia was due to obstruction. Foley catheter has been replaced. Admit to telemetry/inpatient. Continue gentle IV hydration. Monitor intake and output. Follow-up renal function  electrolytes.  Active Problems:  AKI on CKD (chronic kidney disease), stage III (HCC) POA, stabilizing Hold off on further IV fluids - tolerating PO better today; patient appears euvolemic Discussion previously over the phone with patient's wife and son -questionable poor p.o. intake in the outpatient setting Nephrology consulted, appreciate insight and recommendations Creatinine stabilizing - BUN now downtrending Foley catheter, chronic, without obvious obstructive uropathy (although there may have been an issue recently last week)  Abnormal urine culture - questionable UTI Patient declines any symptoms but urine collected at admission remarkable for staph aur. Growth - will administer ceftriaxone x3 doses - patient did have recent foley change so less likely colonization  Elevated troponin, likely type II NSTEMI supply demand mismatch Patient appears well, asymptomatic Likely in the setting of AKI on CKD with concurrent dehydration Echocardiogram remarkable for EF 40 to 45%, hypokinesis basal/mid inferoseptum - recommending difinity contrast study - this can be done in the outpatient setting if indicated  Normocytic anemia likely anemia of chronic disease Asymptomatic, likely at baseline Follow am labs  Hyperlipidemia Continue atorvastatin 40 mg every evening.  Coronary atherosclerosis, history of Continue atorvastatin/statin  HTN (hypertension), reported Currently not on antihypertensives. Monitor blood pressure.  Prolonged QT, minimal Avoid medications that prolong QT. QTc 460   PAF (paroxysmal atrial fibrillation) (HCC) CHADSVASC Score of at least 5. Continue apixaban - start lower dose 2.5 given elevated Cr/depressed GFR as above  DVT prophylaxis: On apixaban. Code Status: Full code. Family Communication: Spoke with wife and son Disposition Plan: Admit for ongoing IV hydration, troponin level trending and echo. Likely dispo in the next 24h pending improvement in  symptoms and resolution of AKI - currently creatinine continues to uptrend minimally despite fluids. Continue as inpatient for ongoing IV fluids and have further eval with nephro  Admission status: Inpatient/telemetry.  Gwyndolyn Saxon  Loraine Grip MD [facility]  08/31/2018, 4:28 PM   This document was prepared using Dragon voice recognition software may contain some unintended transcription errors.

## 2018-08-31 NOTE — Progress Notes (Signed)
Subjective:  Almost 3 liters of urine - kidney function stable  Objective Vital signs in last 24 hours: Vitals:   08/30/18 1708 08/30/18 1959 08/31/18 0403 08/31/18 0924  BP: (!) 162/84 137/67 (!) 149/71 140/77  Pulse: 87 94 75 80  Resp: (!) 21     Temp: 98 F (36.7 C) 99 F (37.2 C) 97.8 F (36.6 C) 97.8 F (36.6 C)  TempSrc: Oral Oral Oral Oral  SpO2: 96% 93% 100% 100%  Weight:  72.1 kg    Height:       Weight change:   Intake/Output Summary (Last 24 hours) at 08/31/2018 1048 Last data filed at 08/31/2018 0743 Gross per 24 hour  Intake 2265.69 ml  Output 2700 ml  Net -434.31 ml    Assessment/ Plan: Pt is a 85 y.o. yo male- crt 1.52 on 07/01/18-  Chronic suprapubic catheter that was not draining s/p replacement on 6/2 who was admitted on 08/28/2018 with some A on CRF  Assessment/Plan:  1. A on CRF in the setting of chronic indwelling foley obstruction- s/p change out on 6/2.  Nonoliguric and renal function stable.  No dialysis indications.  Urine looked dirty not surprising but with only 30,000 staph - started on rocephin.  I agree this should get better.  Should be ready for discharge tomorrow regardless- labs can be followed as OP by his PCP to insure improvement 2. HTN- slightly elevated- no action needed   3. Anemia- also mild    Louis Meckel    Labs: Basic Metabolic Panel: Recent Labs  Lab 08/29/18 0350 08/30/18 1109 08/31/18 0215  NA 141 142 144  K 3.2* 4.9 4.9  CL 106 113* 116*  CO2 22 20* 20*  GLUCOSE 137* 140* 123*  BUN 41* 42* 39*  CREATININE 3.33* 3.46* 3.50*  CALCIUM 8.8* 8.9 8.7*   Liver Function Tests: No results for input(s): AST, ALT, ALKPHOS, BILITOT, PROT, ALBUMIN in the last 168 hours. No results for input(s): LIPASE, AMYLASE in the last 168 hours. No results for input(s): AMMONIA in the last 168 hours. CBC: Recent Labs  Lab 08/28/18 1536 08/29/18 0350 08/30/18 1109 08/31/18 0215  WBC 8.4 6.4 12.3* 10.0  HGB 10.9* 10.1* 10.7*  10.5*  HCT 33.3* 31.2* 32.9* 32.0*  MCV 92.2 92.3 91.1 91.7  PLT 129* 115* 118* 112*   Cardiac Enzymes: Recent Labs  Lab 08/28/18 2211 08/29/18 0350  TROPONINI 0.70* 0.47*   CBG: No results for input(s): GLUCAP in the last 168 hours.  Iron Studies: No results for input(s): IRON, TIBC, TRANSFERRIN, FERRITIN in the last 72 hours. Studies/Results: No results found. Medications: Infusions: . cefTRIAXone (ROCEPHIN)  IV 1 g (08/30/18 1713)    Scheduled Medications: . apixaban  2.5 mg Oral BID  . atorvastatin  40 mg Oral QPM  . famotidine  20 mg Oral Daily  . polyvinyl alcohol  1 drop Both Eyes TID    have reviewed scheduled and prn medications.  Physical Exam: General: NAD- pleasant dementia Heart: RRR Lungs: mostly clear Abdomen: soft, suprapubic catheter is not tender  Extremities: no edema    08/31/2018,10:48 AM  LOS: 3 days

## 2018-09-01 LAB — BASIC METABOLIC PANEL
Anion gap: 11 (ref 5–15)
BUN: 38 mg/dL — ABNORMAL HIGH (ref 8–23)
CO2: 20 mmol/L — ABNORMAL LOW (ref 22–32)
Calcium: 9.1 mg/dL (ref 8.9–10.3)
Chloride: 114 mmol/L — ABNORMAL HIGH (ref 98–111)
Creatinine, Ser: 3.29 mg/dL — ABNORMAL HIGH (ref 0.61–1.24)
GFR calc Af Amer: 18 mL/min — ABNORMAL LOW (ref >60)
GFR calc non Af Amer: 16 mL/min — ABNORMAL LOW (ref >60)
Glucose, Bld: 125 mg/dL — ABNORMAL HIGH (ref 70–99)
Potassium: 4.7 mmol/L (ref 3.5–5.1)
Sodium: 145 mmol/L (ref 135–145)

## 2018-09-01 LAB — CBC
HCT: 32.2 % — ABNORMAL LOW (ref 39.0–52.0)
Hemoglobin: 10.7 g/dL — ABNORMAL LOW (ref 13.0–17.0)
MCH: 30.2 pg (ref 26.0–34.0)
MCHC: 33.2 g/dL (ref 30.0–36.0)
MCV: 91 fL (ref 80.0–100.0)
Platelets: 125 10*3/uL — ABNORMAL LOW (ref 150–400)
RBC: 3.54 MIL/uL — ABNORMAL LOW (ref 4.22–5.81)
RDW: 14.6 % (ref 11.5–15.5)
WBC: 8.8 10*3/uL (ref 4.0–10.5)
nRBC: 0 % (ref 0.0–0.2)

## 2018-09-01 LAB — URINE CULTURE: Culture: 30000 — AB

## 2018-09-01 MED ORDER — SODIUM CHLORIDE 0.9 % IV SOLN
1.0000 g | INTRAVENOUS | Status: AC
  Administered 2018-09-01: 1 g via INTRAVENOUS
  Filled 2018-09-01 (×2): qty 10

## 2018-09-01 MED ORDER — APIXABAN 2.5 MG PO TABS: 2.5000 mg | ORAL_TABLET | Freq: Two times a day (BID) | ORAL | 0 refills | Status: DC

## 2018-09-01 NOTE — Discharge Summary (Signed)
DC Summary   Ronald [name] CHY:[id number] DOB: 1927/11/01 DOA: 08/28/2018  PCP: Isaac Bliss, [name], MD   Patient coming from: Home.  I have personally briefly reviewed patient's old medical records in [place]  Chief Complaint: Shortness of breath.  HPI: Ronald [name] is a 85 y.o. male with medical history significant of osteoarthritis, history of sleep apnea, history of chronic cystitis and urolithiasis, stage II CKD, GERD, CAD/CABG, history of chronic systolic CHF, but the last echo his EF normalized, history of bradycardia, GERD, Bell's palsy, hyperlipidemia, hypertension, melanoma of the right ear, mild dementia, persistent atrial fibrillation history of UTIs with with episode of sepsis who is coming to the emergency department with complaints of progressively worsening dyspnea since Friday or Thursday last week, associated with clear sputum production, lower extremity edema and orthopnea.  He denies chest pain, palpitations, he complains of occasional positional dizziness.  He complains his Foley catheter has not drained for the past 3 days, had mild abdominal pain and nausea.  However this was changed by urology earlier today.  He denies fever, chills, sore throat, rhinorrhea, wheezing, hemoptysis, diarrhea, constipation, melena or hematochezia. In the ED pt initial vital signs temperature 98.4 F, pulse 86, respiration 18, blood pressure 167/84 mmHg and O2 sat 100% on nasal cannula oxygen. White count is 8.4, hemoglobin 10.9 g/dL and platelets 129.  I-STAT troponin was 0.52 ng/mL and BNP was 441.0 pg/mL.  BMP shows normal electrolytes except for a CO2 of 21.  Glucose 110, BUN 44 and creatinine 3.28 mg/dL.  Baseline creatinine has ranged from normal to recently 1.52mg /dL.  EKG show atrial flutter with any acute changes.  His chest radiograph shows minimal left subsegmental atelectasis and basal scarring.  Patient admitted as above with acute kidney injury, ambulatory  dysfunction and general malaise.  Initially noted to be AKI with baseline creatinine around 1.5, creatinine continue to trend upwards, nephrology consulted, indicates likely previous obstructive issue with chronic only, now resolved with new Foley catheter placed by urology prior to admission, mild hematuria noted at admission this too has resolved.  Urine culture markable for 30,000 colonies of staph aureus resistant to Cipro, Clinda, tetracycline, Bactrim, patient has completed 3-day course of ceftriaxone which may have contributed to his malaise and weakness at admission.  PT evaluated and recommended home health PT at discharge which patient is agreeable to.  This time patient's creatinine continues to downtrend somewhat minimally however nephrology indicates that given the downtrend patient can be followed in the outpatient setting safely.  Patient otherwise stable and agreeable for discharge with home health.  Review of Systems: As per HPI otherwise 10 point review of systems negative.   Past Medical History:  Diagnosis Date  . Arthritis    "joints ache" (01/02/2014)  . At risk for sleep apnea    STOP-BANG= 4    SENT TO PCP 12-23-2013  . Bladder calculi   . Bradycardia   . Chronic cystitis   . Chronic systolic CHF (congestive heart failure) (HCC)    a. EF improved to 50-55% in 2018, previously 35-40%  . CKD (chronic kidney disease), stage II   . Coronary artery disease CARDIOLOGIST-  DR Angelena Form   a. CAD s/p CABG in 1989. b. redo bypass in 1997. c. last cath 2015 - med rx.  . Diverticulosis   . GERD (gastroesophageal reflux disease)   . History of Bell's palsy    RIGHT SIDE-- NO RESIDUAL  . Hx of dizziness   .  Hyperlipidemia   . Hypertension    "not anymore" (01/02/2014)  . Ischemic cardiomyopathy   . Kidney stones "years ago"   "passed them"  . Melanoma of ear ([place])    "right"  . Mild dementia ([place])   . Myocardial infarction ([place]) 1986; 1997  . Nocturia   . OAB (overactive  bladder)   . Persistent atrial fibrillation   . Prostate cancer ([place]) 1998   S/P  [place]; [place]  . S/P CABG (coronary artery bypass graft)    [place]  . Sepsis due to urinary tract infection ([place])   . Urethral stricture   . UTI (urinary tract infection) 09/2015  . Wears hearing aid    bilateral  . Wears partial dentures     Past Surgical History:  Procedure Laterality Date  . CARDIAC CATHETERIZATION  02-22-2005  dr Vidal Schwalbe   mild to moderate lv dysfunction with inferobasilar akinesis/  totally occluded SVG to Intermediate Diagonal and SVG to PDA and RCA branches, totally occluded native coronary circulation with diffuse disease pLAD diagonal system with potentially could be ischemic/  patent SVG to OM with collaterals to dRCA and patent LIMA to LAD and diagonal systemss  . CARDIAC CATHETERIZATION  08-28-2013  DR Daneen Schick   widely patent sequential left internal graft to the diagonal/LAD, widely patent SVG to OM with proximal 50% narrowing noted in the graft, total occlusion SVG's to RCA, RI, and the Diagonal/ LV dysfunction with inferobasal aneurysm and mid anterior wall region of akinesis/  overall EF 35-40%/  Total occlusion of the navtive circulation/  no significant change compared to 2006 cath  . CARDIOVASCULAR STRESS TEST  08-01-2011  dr Angelena Form   inferior scar and possible soft tissue attenuation with minimal peri-infarct ischemia, small region of anterior ischemia and scar/  LVEF 53% LV wall motion with inferior hypokinesis/ no significant change from scan july 2011  . CATARACT EXTRACTION W/ INTRAOCULAR LENS  IMPLANT, BILATERAL Bilateral 2007  . CORONARY ARTERY BYPASS GRAFT  11/ [place]-- 6 vessel/  1997 Re-do 5 vessel  . CYSTOSCOPY WITH URETHRAL DILATATION N/A 12/25/2013   Procedure: CYSTOSCOPY WITH URETHRAL DILATATION, WITH BIOPSY;  Surgeon: Bernestine Amass, MD;  [facility];  Service:  Urology;  Laterality: N/A;  . CYSTOSCOPY WITH URETHRAL DILATATION N/A 12/22/2014   Procedure: CYSTOSCOPY WITH URETHRAL DILATATION;  Surgeon: Rana Snare, MD;  Location: [facility];  Service: Urology;  Laterality: N/A;  BALLOON DILATION CATHETER    . EUS  10/05/2011   Procedure: ESOPHAGEAL ENDOSCOPIC ULTRASOUND (EUS) RADIAL;  Surgeon: Arta Silence, MD;  Location: [facility];  Service: Endoscopy;  Laterality: N/A;  . INSERTION OF SUPRAPUBIC CATHETER N/A 12/22/2014   Procedure: INSERTION OF SUPRAPUBIC CATHETER ;  Surgeon: Rana Snare, MD;  Location: [facility];  Service: Urology;  Laterality: N/A;  . LAPAROSCOPIC CHOLECYSTECTOMY  12-23-2005  . LEFT HEART CATHETERIZATION WITH CORONARY/GRAFT ANGIOGRAM N/A 08/28/2013   Procedure: LEFT HEART CATHETERIZATION WITH Beatrix Fetters;  Surgeon: Sinclair Grooms, MD;  Location: [facility] C[facility];  Service: Cardiovascular;  Laterality: N/A;  . MELANOMA EXCISION  X 1   "ear"  . RADIOACTIVE PROSTATE SEED IMPLANTS  1998  . SKIN CANCER EXCISION  X 2   "top of head"  . TONSILLECTOMY AND ADENOIDECTOMY  1954    Allergies  Allergen Reactions  . Pantoprazole Other (See Comments)    Headache  and lightheaded  . Nitrofurantoin Hives    dizziness and lightheaded   . Sulfa Antibiotics Hives and Itching  . Lidocaine Swelling    Mouth swelling   . Tape Other (See Comments)    TAPE WILL TEAR THE SKIN!!!    Family History  Problem Relation Age of Onset  . Heart disease Mother   . Diabetes Father   . Heart disease Brother    Prior to Admission medications   Medication Sig Start Date End Date Taking? Authorizing Provider  apixaban (ELIQUIS) 5 MG TABS tablet Take 1 tablet (5 mg total) by mouth 2 (two) times daily. 05/10/17  Yes Burnell Blanks, MD  atorvastatin (LIPITOR) 80 MG tablet Take 0.5 tablets (40 mg total) by mouth every evening. 05/24/17  Yes Marletta Lor, MD  Calcium Carbonate Antacid (TUMS PO) Take 3 tablets by mouth daily as needed  (gas).   Yes [provider]  Multiple Vitamin (MULTIVITAMIN WITH MINERALS) TABS Take 1 tablet by mouth daily.   Yes [provider]  acetaminophen (TYLENOL) 500 MG tablet Take 500 mg by mouth every 6 (six) hours as needed for moderate pain or fever.     [provider]  ciprofloxacin (CIPRO) 500 MG tablet Take 1 tablet (500 mg total) by mouth 2 (two) times daily. Patient not taking: Reported on 08/28/2018 07/02/18   Mesner, Corene Cornea, MD  hydroxypropyl methylcellulose (ISOPTO TEARS) 2.5 % ophthalmic solution Place 1 drop into both eyes 2 (two) times daily.     [provider]  meclizine (ANTIVERT) 12.5 MG tablet Take 1 tablet (12.5 mg total) by mouth 3 (three) times daily as needed for dizziness. Patient not taking: Reported on 08/28/2018 06/11/18   Isaac Bliss, [name], MD  nitroGLYCERIN (NITROSTAT) 0.4 MG SL tablet Place 0.4 mg under the tongue every 5 (five) minutes as needed for chest pain.     [provider]  Polyethyl Glycol-Propyl Glycol (SYSTANE) 0.4-0.3 % SOLN Apply 1 drop to eye 3 (three) times daily.    [provider]  ranitidine (ZANTAC) 150 MG tablet Take 1 tablet (150 mg total) by mouth 2 (two) times daily. Patient not taking: Reported on 08/28/2018 05/18/16   Burnell Blanks, MD    Physical Exam: Vitals:   08/31/18 0924 08/31/18 1650 09/01/18 0629 09/01/18 0905  BP: 140/77 137/71 (!) 166/88 (!) 143/80  Pulse: 80 78 82 82  Resp:  20 18 20   Temp: 97.8 F (36.6 C) 98 F (36.7 C) 98 F (36.7 C) 98.2 F (36.8 C)  TempSrc: Oral Oral  Oral  SpO2: 100% 100% 98% 97%  Weight:      Height:       General:  Pleasantly resting in bed, No acute distress. HEENT:  Normocephalic atraumatic.  Sclerae nonicteric, noninjected.  Extraocular movements intact bilaterally. Neck:  Without mass or deformity.  Trachea is midline. Lungs:  Clear to auscultate bilaterally without rhonchi, wheeze, or rales. Heart: Irregularly irregular rate  and rhythm.  Without murmurs, rubs, or gallops. Abdomen:  Soft, nontender, nondistended.  Without guarding or rebound. Extremities: Without cyanosis, clubbing, 2+ edema, or obvious deformity. Vascular:  Dorsalis pedis and posterior tibial pulses palpable bilaterally. Skin:  Warm and dry, no erythema, no ulcerations.  Labs on Admission: I have personally reviewed following labs and imaging studies  CBC: Recent Labs  Lab 08/28/18 1536 08/29/18 0350 08/30/18 1109 08/31/18 0215 09/01/18 0213  WBC 8.4 6.4 12.3* 10.0 8.8  HGB 10.9* 10.1* 10.7* 10.5* 10.7*  HCT 33.3* 31.2* 32.9* 32.0* 32.2*  MCV 92.2 92.3 91.1 91.7 91.0  PLT 129* 115* 118* 112* 032*   Basic Metabolic Panel: Recent Labs  Lab 08/28/18 1536 08/28/18 2211 08/29/18 0350 08/30/18 1109 08/31/18 0215 09/01/18 0213  NA 139  --  141 142 144 145  K 4.1  --  3.2* 4.9 4.9 4.7  CL 106  --  106 113* 116* 114*  CO2 21*  --  22 20* 20* 20*  GLUCOSE 110*  --  137* 140* 123* 125*  BUN 44*  --  41* 42* 39* 38*  CREATININE 3.28*  --  3.33* 3.46* 3.50* 3.29*  CALCIUM 9.1  --  8.8* 8.9 8.7* 9.1  MG  --  2.1  --   --   --   --    GFR: Estimated Creatinine Clearance: 13.2 mL/min (A) (by C-G formula based on SCr of 3.29 mg/dL (H)). Liver Function Tests: No results for input(s): AST, ALT, ALKPHOS, BILITOT, PROT, ALBUMIN in the last 168 hours. No results for input(s): LIPASE, AMYLASE in the last 168 hours. No results for input(s): AMMONIA in the last 168 hours. Coagulation Profile: Recent Labs  Lab 08/28/18 2211  INR 1.3*   Cardiac Enzymes: Recent Labs  Lab 08/28/18 2211 08/29/18 0350  TROPONINI 0.70* 0.47*   BNP (last 3 results) No results for input(s): PROBNP in the last 8760 hours. HbA1C: No results for input(s): HGBA1C in the last 72 hours. CBG: No results for input(s): GLUCAP in the last 168 hours. Lipid Profile: No results for input(s): CHOL, HDL, LDLCALC, TRIG, CHOLHDL, LDLDIRECT in the last 72 hours. Thyroid  Function Tests: No results for input(s): TSH, T4TOTAL, FREET4, T3FREE, THYROIDAB in the last 72 hours. Anemia Panel: No results for input(s): VITAMINB12, FOLATE, FERRITIN, TIBC, IRON, RETICCTPCT in the last 72 hours. Urine analysis:    Component Value Date/Time   COLORURINE STRAW (A) 08/28/2018 1915   APPEARANCEUR CLEAR 08/28/2018 1915   LABSPEC 1.004 (L) 08/28/2018 1915   PHURINE 6.0 08/28/2018 1915   GLUCOSEU NEGATIVE 08/28/2018 1915   HGBUR LARGE (A) 08/28/2018 [place] NEGATIVE 08/28/2018 1915   BILIRUBINUR neg 06/30/2014 [place] 08/28/2018 1915   PROTEINUR NEGATIVE 08/28/2018 1915   UROBILINOGEN 0.2 12/04/2014 2344   NITRITE NEGATIVE 08/28/2018 1915   LEUKOCYTESUR LARGE (A) 08/28/2018 1915    Radiological Exams on Admission: No results found. Assessment/Plan Principal Problem:   AKI (acute kidney injury) ([place]) Acidemia was due to obstruction. Foley catheter has been replaced. Admit to telemetry/inpatient. Continue gentle IV hydration. Monitor intake and output. Follow-up renal function electrolytes.  Active Problems:  AKI on CKD (chronic kidney disease), stage III (HCC) POA, minimally improving Hold off on further IV fluids - tolerating PO better today; patient appears euvolemic Discussion previously over the phone with patient's wife and son -questionable poor p.o. intake in the outpatient setting Nephrology consulted, appreciate insight and recommendations Creatinine stabilizing - BUN now downtrending Foley catheter, chronic, without obvious obstructive uropathy (although there may have been an issue recently last week)  Abnormal urine culture - questionable UTI Patient declines any symptoms but urine collected at admission remarkable for aureus.  Sensitive to penicillin as above Completed 3 course of ceftriaxone  Elevated troponin, likely type II NSTEMI supply demand mismatch Patient appears well, asymptomatic Likely in the setting  of AKI on CKD with concurrent dehydration Echocardiogram remarkable for EF 40 to 45%, hypokinesis basal/mid inferoseptum - recommending difinity contrast study - this can  be done in the outpatient setting if indicated  Normocytic anemia likely anemia of chronic disease Asymptomatic, likely at baseline Follow am labs  Hyperlipidemia Continue atorvastatin 40 mg every evening.  Coronary atherosclerosis, history of Continue atorvastatin/statin  HTN (hypertension), reported Currently not on antihypertensives. Monitor blood pressure.  Prolonged QT, minimal Avoid medications that prolong QT. QTc 460   PAF (paroxysmal atrial fibrillation) (HCC) CHADSVASC Score of at least 5. Continue apixaban - start lower dose 2.5 given elevated Cr/depressed GFR as above  DVT prophylaxis: On apixaban. Code Status: Full code. Family Communication: Spoke with wife and son Disposition Plan: DC home continue HHPT to improve weakness.  Admission status: Inpatient/telemetry.  Little Ishikawa MD [facility]  09/01/2018, 2:37 PM

## 2018-09-01 NOTE — Progress Notes (Signed)
4:13 PM 09/01/18   DISCHARGE NOTE HOME RAEL YO to be discharged Home per MD order. Discussed prescriptions and follow up appointments with the patient. Prescriptions given to patient; medication list explained in detail. Patient verbalized understanding.  Skin clean, dry and intact without evidence of skin break down, no evidence of skin tears noted. IV catheter discontinued intact. Site without signs and symptoms of complications. Dressing and pressure applied. Pt denies pain at the site currently. No complaints noted.  Patient free of lines, drains, and wounds.   An After Visit Summary (AVS) was printed and given to the patient. Patient escorted via wheelchair, and discharged home via private auto.  Lateef Juncaj, Beryle Lathe, RN

## 2018-09-01 NOTE — TOC Initial Note (Signed)
Transition of Care [facility]) - Initial/Assessment Note    Patient Details  Name: [name] [name] MRN: [id number] Date of Birth: 06/15/1927  [facility] CM/SW Contact:    Bartholomew Crews, RN Phone Number: [phone] 09/01/2018, 2:54 PM  Clinical Narrative:                 Spoke with patient at the bedside. PTA patient home with spouse with no HH. Discussed PT recommendations for [facility] - patient in agreement. States that he has received HH from [facility]. Patient provided permission for CM to speak with his wife. Telephone call to spouse - [name] [name] - who confirmed [facility] and agreed to resume PT. Referral accepted by [facility]. Patient to transition home today. Spouse states that her son will assist with transportation home. No other transition of care needs.   Expected Discharge Plan: [place] Barriers to Discharge: No Barriers Identified   Patient Goals and CMS Choice   CMS Medicare.gov Compare Post Acute Care list provided to:: Patient Choice offered to / list presented to : Patient  Expected Discharge Plan and Services Expected Discharge Plan: [place]   Discharge Planning Services: CM Consult Post Acute Care Choice: [place] arrangements for the past 2 months: Single Family Home Expected Discharge Date: 09/01/18               DME Arranged: N/A DME Agency: NA       HH Arranged: PT HH Agency: [place] ([place]) Date [place]: 09/01/18 Time [place]: 1453 Representative spoke with at [place]: [place] Arrangements/Services Living arrangements for the past 2 months: [place] with:: Self, Spouse Patient language and need for interpreter reviewed:: Yes Do you feel safe going back to the place where you live?: Yes      Need for Family Participation in Patient Care: Yes (Comment) Care giver support system in place?: Yes (comment)   Criminal Activity/Legal Involvement  Pertinent to Current Situation/Hospitalization: No - Comment as needed  Activities of Daily Living Home Assistive Devices/Equipment: Cane (specify quad or straight), Other (Comment)(drainage bags) ADL Screening (condition at time of admission) Patient's cognitive ability adequate to safely complete daily activities?: Yes Is the patient deaf or have difficulty hearing?: Yes Does the patient have difficulty seeing, even when wearing glasses/contacts?: No Does the patient have difficulty concentrating, remembering, or making decisions?: No Patient able to express need for assistance with ADLs?: Yes Does the patient have difficulty dressing or bathing?: No Independently performs ADLs?: Yes (appropriate for developmental age) Does the patient have difficulty walking or climbing stairs?: Yes Weakness of Legs: Both Weakness of Arms/Hands: None  Permission Sought/Granted Permission sought to share information with : Family Supports Permission granted to share information with : Yes, Verbal Permission Granted  Share Information with NAME: [name] [name]     Permission granted to share info w Relationship: spouse     Emotional Assessment Appearance:: Appears stated age Attitude/Demeanor/Rapport: Engaged Affect (typically observed): Accepting Orientation: : Oriented to Self, Oriented to  Time, Oriented to Place, Oriented to Situation Alcohol / Substance Use: Not Applicable Psych Involvement: No (comment)  Admission diagnosis:  AKI (acute kidney injury) ([place]) [N17.9] Patient Active Problem List   Diagnosis Date Noted  . Elevated troponin 08/28/2018  . Normocytic anemia 08/28/2018  . UTI (urinary tract infection) 03/06/2018  . Chronic diastolic CHF (congestive heart failure) ([place]) 12/26/2017  . PAF (paroxysmal atrial fibrillation) (  [place]) 12/26/2017  . Fever 05/11/2017  . AKI (acute kidney injury) ([place])   . CKD (chronic kidney disease), stage III ([place])   . Bacteremia due to Pseudomonas  01/21/2016  . Debility 01/21/2016  . History of Bell's palsy   . Gastroesophageal reflux disease without esophagitis   . Coronary artery disease involving coronary bypass graft of native heart without angina pectoris   . History of prostate cancer   . Suprapubic catheter ([place])   . Incontinence of feces   . Renal failure syndrome   . Bacteremia   . NSTEMI (non-ST elevated myocardial infarction) ([place]) 01/16/2016  . ARF (acute renal failure) ([place]) 10/13/2015  . Influenza A 12/05/2014  . Urinary tract infection associated with cystostomy catheter ([place]) 10/03/2014  . Acute lower UTI 09/10/2014  . Bacteremia due to Enterococcus 01/22/2014  . Chronic systolic CHF (congestive heart failure) ([place]) 04-07-2013  . Urethral stricture 12/25/2013  . Bladder calculus 12/25/2013  . LV dysfunction 09/23/2013  . Unstable angina ([place]) 08/27/2013  . Bacteremia due to Escherichia coli 05/20/2012  . Urinary tract infection 09/15/2011  . Calculus of kidney 09/12/2011  . HYPERCHOLESTEROLEMIA 10/18/2007  . HTN (hypertension) 10/18/2007  . OSTEOARTHRITIS 02/07/2007  . BELL'S PALSY, RIGHT 10/26/2006  . Coronary atherosclerosis 10/26/2006  . PROSTATE CANCER, HX OF 10/26/2006  . NEPHROLITHIASIS, HX OF 10/26/2006   PCP:  [name] [name], [name] Halsted, MD Pharmacy:   [id number] [place], [address] [place] [address] Phone: [phone] Fax: [phone]     Social Determinants of Health (SDOH) Interventions    Readmission Risk Interventions No flowsheet data found.

## 2018-09-01 NOTE — Evaluation (Signed)
Occupational Therapy Evaluation Patient Details Name: Ronald [name] MRN: [id number] DOB: 02-25-1928 Today's Date: 09/01/2018    History of Present Illness Ronald [name] is a 85 y.o. male with medical history significant of osteoarthritis, history of sleep apnea, history of chronic cystitis and urolithiasis, stage II CKD, GERD, CAD/CABG, history of chronic systolic CHF, but the last echo his EF normalized, history of bradycardia, GERD, Bell's palsy, hyperlipidemia, hypertension, melanoma of the right ear, mild dementia, persistent atrial fibrillation history of UTIs with with episode of sepsis who is coming to the emergency department with complaints of progressively worsening dyspnea since Friday or Thursday last week, associated with clear sputum production, lower extremity edema and orthopnea.   Clinical Impression   Pt PTA: living with spouse at home. Pt currently performing supervision level for ADL and mobility with RW. Pt performing own grooming and toilet hygiene in standing and LB Dressing in sitting. Pt would benefit from continued OT skilled services for ADL, mobility and safety in [place] setting. OT to follow acutely for energy conservation and HR with activity.  HR 114 BPM with exertion.    Follow Up Recommendations  Home health OT;Supervision - Intermittent    Equipment Recommendations  None recommended by OT    Recommendations for Other Services       Precautions / Restrictions Precautions Precautions: Fall Restrictions Weight Bearing Restrictions: No      Mobility Bed Mobility Overal bed mobility: Modified Independent             General bed mobility comments: increased time and effort  Transfers Overall transfer level: Needs assistance Equipment used: Rolling walker (2 wheeled) Transfers: Sit to/from Stand Sit to Stand: Min guard         General transfer comment: min guard to stand, no LOB, good use of RW    Balance Overall balance assessment: Needs  assistance Sitting-balance support: Single extremity supported Sitting balance-Leahy Scale: Fair     Standing balance support: Bilateral upper extremity supported Standing balance-Leahy Scale: Fair                             ADL either performed or assessed with clinical judgement   ADL Overall ADL's : At baseline                                     Functional mobility during ADLs: Min guard;Rolling walker;Cueing for sequencing General ADL Comments: Pt performing LB dressing,  toilet hygiene and grooming at sink with supervisionA.     Vision Baseline Vision/History: No visual deficits Vision Assessment?: No apparent visual deficits     Perception     Praxis      Pertinent Vitals/Pain Pain Assessment: Faces Faces Pain Scale: Hurts little more Pain Location: penis (due to catheter stuck under toilet seat) Pain Descriptors / Indicators: Discomfort Pain Intervention(s): Monitored during session     Hand Dominance Right   Extremity/Trunk Assessment Upper Extremity Assessment Upper Extremity Assessment: Overall WFL for tasks assessed   Lower Extremity Assessment Lower Extremity Assessment: Overall WFL for tasks assessed   Cervical / Trunk Assessment Cervical / Trunk Assessment: Normal   Communication Communication Communication: HOH   Cognition Arousal/Alertness: Awake/alert Behavior During Therapy: WFL for tasks assessed/performed Overall Cognitive Status: Within Functional Limits for tasks assessed  General Comments: mild dementia in chart. some inconsistent answers provided.    General Comments  HR increased to 114 BPM with activity    Exercises     Shoulder Instructions      Home Living Family/patient expects to be discharged to:: Private residence Living Arrangements: Spouse/significant other Available Help at Discharge: Family;Available 24 hours/day Type of Home: House Home  Access: Stairs to enter [employer] of Steps: 3 Entrance Stairs-Rails: Left Home Layout: One level     Bathroom Shower/Tub: Teacher, early years/pre: Handicapped height Bathroom Accessibility: No   Home Equipment: Environmental consultant - 2 wheels;Cane - single point          Prior Functioning/Environment Level of Independence: Independent with assistive device(s)        Comments: SPC for household distances        OT Problem List: Decreased strength;Decreased activity tolerance;Impaired balance (sitting and/or standing);Decreased safety awareness;Pain      OT Treatment/Interventions: Self-care/ADL training;Therapeutic exercise;Neuromuscular education;Energy conservation;DME and/or AE instruction;Therapeutic activities;Patient/family education;Balance training    OT Goals(Current goals can be found in the care plan section) Acute Rehab OT Goals Patient Stated Goal: go home OT Goal Formulation: With patient Time For Goal Achievement: 09/15/18 Potential to Achieve Goals: Good ADL Goals Pt Will Perform Upper Body Bathing: with set-up;standing Pt Will Perform Upper Body Dressing: with set-up;standing Pt Will Perform Lower Body Dressing: with set-up;sitting/lateral leans;sit to/from stand Pt Will Perform Toileting - Clothing Manipulation and hygiene: with set-up;sitting/lateral leans;sit to/from stand Additional ADL Goal #1: Pt modified independence level for all ADL  OT Frequency: Min 2X/week   Barriers to D/C:            Co-evaluation              AM-PAC OT "6 Clicks" Daily Activity     Outcome Measure Help from another person eating meals?: None Help from another person taking care of personal grooming?: None Help from another person toileting, which includes using toliet, bedpan, or urinal?: A Little Help from another person bathing (including washing, rinsing, drying)?: A Little Help from another person to put on and taking off regular upper body  clothing?: None Help from another person to put on and taking off regular lower body clothing?: A Little 6 Click Score: 21   End of Session Equipment Utilized During Treatment: Gait belt;Rolling walker Nurse Communication: Mobility status  Activity Tolerance: Patient tolerated treatment well Patient left: in bed;with call bell/phone within reach  OT Visit Diagnosis: Unsteadiness on feet (R26.81);Muscle weakness (generalized) (M62.81)                Time: 2330-0762 OT Time Calculation (min): 21 min Charges:  OT General Charges $OT Visit: 1 Visit OT Evaluation $OT Eval Moderate Complexity: 1 Mod  Darryl Nestle) Marsa Aris OTR/L Acute Rehabilitation Services Pager: [phone] Office: [place] 09/01/2018, 2:15 PM

## 2018-09-01 NOTE — Progress Notes (Signed)
Subjective:  over 3 liters of urine - kidney function stable to a little improved  Objective Vital signs in last 24 hours: Vitals:   08/31/18 0924 08/31/18 1650 09/01/18 0629 09/01/18 0905  BP: 140/77 137/71 (!) 166/88 (!) 143/80  Pulse: 80 78 82 82  Resp:  20 18 20   Temp: 97.8 F (36.6 C) 98 F (36.7 C) 98 F (36.7 C) 98.2 F (36.8 C)  TempSrc: Oral Oral  Oral  SpO2: 100% 100% 98% 97%  Weight:      Height:       Weight change:   Intake/Output Summary (Last 24 hours) at 09/01/2018 1051 Last data filed at 09/01/2018 0906 Gross per 24 hour  Intake 1820 ml  Output 3000 ml  Net -1180 ml    Assessment/ Plan: Pt is a 85 y.o. yo male- crt 1.52 on 07/01/18-  Chronic suprapubic catheter that was not draining s/p replacement on 6/2 who was admitted on 08/28/2018 with some A on CRF  Assessment/Plan:  1. A on CRF in the setting of chronic indwelling foley obstruction- s/p change out on 6/2.  Nonoliguric and renal function stable.  No dialysis indications.  Urine looked dirty not surprising but with only 30,000 staph - started on rocephin.  I agree this should get better.  Is ready for discharge today regardless- labs can be followed as OP by his PCP to insure improvement.  I do not think that he need nephrology specific follow up- just needs time to get better- renal will sign off  2. HTN- slightly elevated- no action needed   3. Anemia- also mild    Ronald [name]    Labs: Basic Metabolic Panel: Recent Labs  Lab 08/30/18 1109 08/31/18 0215 09/01/18 0213  NA 142 144 145  K 4.9 4.9 4.7  CL 113* 116* 114*  CO2 20* 20* 20*  GLUCOSE 140* 123* 125*  BUN 42* 39* 38*  CREATININE 3.46* 3.50* 3.29*  CALCIUM 8.9 8.7* 9.1   Liver Function Tests: No results for input(s): AST, ALT, ALKPHOS, BILITOT, PROT, ALBUMIN in the last 168 hours. No results for input(s): LIPASE, AMYLASE in the last 168 hours. No results for input(s): AMMONIA in the last 168 hours. CBC: Recent Labs  Lab  08/28/18 1536 08/29/18 0350 08/30/18 1109 08/31/18 0215 09/01/18 0213  WBC 8.4 6.4 12.3* 10.0 8.8  HGB 10.9* 10.1* 10.7* 10.5* 10.7*  HCT 33.3* 31.2* 32.9* 32.0* 32.2*  MCV 92.2 92.3 91.1 91.7 91.0  PLT 129* 115* 118* 112* 125*   Cardiac Enzymes: Recent Labs  Lab 08/28/18 2211 08/29/18 0350  TROPONINI 0.70* 0.47*   CBG: No results for input(s): GLUCAP in the last 168 hours.  Iron Studies: No results for input(s): IRON, TIBC, TRANSFERRIN, FERRITIN in the last 72 hours. Studies/Results: No results found. Medications: Infusions: . cefTRIAXone (ROCEPHIN)  IV      Scheduled Medications: . apixaban  2.5 mg Oral BID  . atorvastatin  40 mg Oral QPM  . famotidine  20 mg Oral Daily  . polyvinyl alcohol  1 drop Both Eyes TID    have reviewed scheduled and prn medications.  Physical Exam: General: NAD- pleasant dementia Heart: RRR Lungs: mostly clear Abdomen: soft, suprapubic catheter is not tender  Extremities: no edema    09/01/2018,10:51 AM  LOS: 4 days

## 2018-09-03 ENCOUNTER — Telehealth: Payer: Self-pay | Admitting: *Deleted

## 2018-09-03 NOTE — Telephone Encounter (Signed)
Transition Care Management Follow-up Telephone Call   Date discharged?   How have you been since you were released from the hospital? Per wife he's been doing fairly well, still weak and c/o dizziness.     Do you understand why you were in the hospital? yes   Do you understand the discharge instructions? No, wants to know should he still continue Furosamide, omeprazole, flavoxate    Where were you discharged to? Home   Items Reviewed:  Medications reviewed: yes  Allergies reviewed: yes  Dietary changes reviewed: N/A  Referrals reviewed: N/A   Functional Questionnaire:   Activities of Daily Living (ADLs):   He states they are independent in the following: ambulation, bathing and hygiene, feeding, continence, grooming, toileting and dressing States they require assistance with the following: N/A    Any transportation issues/concerns?: no   Any patient concerns? Yes; wife wants to know if he should continue with the (Furosemide, Omeprazole, and Flavoxate   Confirmed importance and date/time of follow-up visits scheduled yes  Provider Appointment booked with Dr. Jerilee Hoh   Confirmed with patient if condition begins to worsen call PCP or go to the ER.  Patient was given the office number and encouraged to call back with question or concerns.  : yes  [facility] follow up (virtual) wife reports she will have to call back so this can be done on son or daughter phone.

## 2018-09-03 NOTE — Telephone Encounter (Signed)
3 Day Zio XT long term holter monitor to be mailed to patients home.  Instructions reviewed briefly as they are included in the monitor kit.

## 2018-09-05 ENCOUNTER — Telehealth: Payer: Self-pay | Admitting: Internal Medicine

## 2018-09-05 DIAGNOSIS — I2510 Atherosclerotic heart disease of native coronary artery without angina pectoris: Secondary | ICD-10-CM | POA: Diagnosis not present

## 2018-09-05 DIAGNOSIS — I255 Ischemic cardiomyopathy: Secondary | ICD-10-CM | POA: Diagnosis not present

## 2018-09-05 DIAGNOSIS — Z9350 Unspecified cystostomy status: Secondary | ICD-10-CM | POA: Diagnosis not present

## 2018-09-05 DIAGNOSIS — I130 Hypertensive heart and chronic kidney disease with heart failure and stage 1 through stage 4 chronic kidney disease, or unspecified chronic kidney disease: Secondary | ICD-10-CM | POA: Diagnosis not present

## 2018-09-05 DIAGNOSIS — F0390 Unspecified dementia without behavioral disturbance: Secondary | ICD-10-CM | POA: Diagnosis not present

## 2018-09-05 DIAGNOSIS — Z7901 Long term (current) use of anticoagulants: Secondary | ICD-10-CM | POA: Diagnosis not present

## 2018-09-05 DIAGNOSIS — Z951 Presence of aortocoronary bypass graft: Secondary | ICD-10-CM | POA: Diagnosis not present

## 2018-09-05 DIAGNOSIS — N179 Acute kidney failure, unspecified: Secondary | ICD-10-CM | POA: Diagnosis not present

## 2018-09-05 DIAGNOSIS — I480 Paroxysmal atrial fibrillation: Secondary | ICD-10-CM | POA: Diagnosis not present

## 2018-09-05 DIAGNOSIS — I5022 Chronic systolic (congestive) heart failure: Secondary | ICD-10-CM | POA: Diagnosis not present

## 2018-09-05 DIAGNOSIS — Z87440 Personal history of urinary (tract) infections: Secondary | ICD-10-CM | POA: Diagnosis not present

## 2018-09-05 DIAGNOSIS — N3020 Other chronic cystitis without hematuria: Secondary | ICD-10-CM | POA: Diagnosis not present

## 2018-09-05 DIAGNOSIS — N183 Chronic kidney disease, stage 3 (moderate): Secondary | ICD-10-CM | POA: Diagnosis not present

## 2018-09-05 DIAGNOSIS — Z8546 Personal history of malignant neoplasm of prostate: Secondary | ICD-10-CM | POA: Diagnosis not present

## 2018-09-05 DIAGNOSIS — Z87891 Personal history of nicotine dependence: Secondary | ICD-10-CM | POA: Diagnosis not present

## 2018-09-05 NOTE — Telephone Encounter (Signed)
Copied from [place] [id number]. Topic: Quick Communication - Home Health Verbal Orders >> Sep 05, 2018  4:22 PM Rutherford Nail, [place] wrote: Caller/Agency: [place] Number: [phone] (can leave a voicemail) Requesting OT/PT/Skilled Nursing/Social Work/Speech Therapy: Physical Therapy Frequency:  1x a week for 5 weeks 1x every other week for 3 weeks  For lower extremity strengthening, gait, and balance training.

## 2018-09-06 NOTE — Telephone Encounter (Signed)
Ok to order [facility].

## 2018-09-06 NOTE — Telephone Encounter (Signed)
Verbal orders given to [facility] for PT

## 2018-09-07 ENCOUNTER — Inpatient Hospital Stay (INDEPENDENT_AMBULATORY_CARE_PROVIDER_SITE_OTHER): Payer: Medicare Other

## 2018-09-07 DIAGNOSIS — R42 Dizziness and giddiness: Secondary | ICD-10-CM | POA: Diagnosis not present

## 2018-09-11 ENCOUNTER — Telehealth: Payer: Self-pay | Admitting: Physician Assistant

## 2018-09-11 ENCOUNTER — Other Ambulatory Visit: Payer: Self-pay

## 2018-09-11 ENCOUNTER — Ambulatory Visit (INDEPENDENT_AMBULATORY_CARE_PROVIDER_SITE_OTHER): Payer: Medicare Other | Admitting: Internal Medicine

## 2018-09-11 DIAGNOSIS — Z9359 Other cystostomy status: Secondary | ICD-10-CM

## 2018-09-11 DIAGNOSIS — I214 Non-ST elevation (NSTEMI) myocardial infarction: Secondary | ICD-10-CM

## 2018-09-11 DIAGNOSIS — N179 Acute kidney failure, unspecified: Secondary | ICD-10-CM

## 2018-09-11 DIAGNOSIS — E7800 Pure hypercholesterolemia, unspecified: Secondary | ICD-10-CM | POA: Diagnosis not present

## 2018-09-11 DIAGNOSIS — K219 Gastro-esophageal reflux disease without esophagitis: Secondary | ICD-10-CM | POA: Diagnosis not present

## 2018-09-11 DIAGNOSIS — N189 Chronic kidney disease, unspecified: Secondary | ICD-10-CM | POA: Diagnosis not present

## 2018-09-11 DIAGNOSIS — I10 Essential (primary) hypertension: Secondary | ICD-10-CM

## 2018-09-11 DIAGNOSIS — I5022 Chronic systolic (congestive) heart failure: Secondary | ICD-10-CM

## 2018-09-11 DIAGNOSIS — Z09 Encounter for follow-up examination after completed treatment for conditions other than malignant neoplasm: Secondary | ICD-10-CM

## 2018-09-11 MED ORDER — OMEPRAZOLE 40 MG PO CPDR: 40.0000 mg | DELAYED_RELEASE_CAPSULE | Freq: Every day | ORAL | 3 refills | Status: AC

## 2018-09-11 MED ORDER — FUROSEMIDE 20 MG PO TABS: 20.0000 mg | ORAL_TABLET | Freq: Every day | ORAL | 2 refills | Status: DC

## 2018-09-11 MED ORDER — ONDANSETRON 4 MG PO TBDP: 4.0000 mg | ORAL_TABLET | Freq: Three times a day (TID) | ORAL | 0 refills | Status: AC | PRN

## 2018-09-11 MED ORDER — CARVEDILOL 3.125 MG PO TABS: 3.1250 mg | ORAL_TABLET | Freq: Two times a day (BID) | ORAL | 2 refills | Status: DC

## 2018-09-11 NOTE — Progress Notes (Signed)
Virtual Visit via Video Note  I connected with Ronald [name] on 09/11/18 at 11:30 AM EDT by a video enabled telemedicine application and verified that I am speaking with the correct person using two identifiers.  Location patient: home Location provider: work office Persons participating in the virtual visit: patient, provider, wife and daughter who assists with history and technology  I discussed the limitations of evaluation and management by telemedicine and the availability of in person appointments. The patient expressed understanding and agreed to proceed.   HPI: Ronald [name] is a 85 y.o. male who is coming in today for the above mentioned reasons, specifically transitional care services face-to-face visit.    Dates hospitalized: 08/28/2018-09/01/2018 Days since discharge from hospital: 10 Patient was discharged from the hospital to: Home Reason for admission to hospital: Shortness of breath, weakness Date of interactive phone contact with patient and/or caregiver: 09/03/2018  I have reviewed in detail patient's discharge summary plus pertinent specific notes, labs, and images from the hospitalization.  Yes  Patient went to the emergency department on 6/2 complaining of generalized weakness and shortness of breath.  He has recurring issues with urinary obstruction and acute on chronic renal failure, he does have a suprapubic catheter.  By time of hospitalization he had not been urinating well for 3 days and was again found to have acute on chronic renal failure.  During the hospitalization he was found to have a non-ST elevated MI with troponins that peaked at 0.70 and an echocardiogram with an ejection fraction of 40 to 45% with some wall motion changes.  Inpatient cardiology consultation was not obtained.  Since hospital discharge patient tells me that he still feels very nauseated, has orthopnea to the point where he has now resorted to sleeping in his recliner (this helps).  He also  has significant lower extremity pain that wakes him up frequently even at rest but is definitely worse with even minimal exertion.  Home health PT was arranged and will start tomorrow.  After hospitalization he was taken off omeprazole (wife is not sure why), his Eliquis was also dose reduced.  He wants to know what we can do to make him feel better as he overall feels miserable.  Medication reconciliation was done today and patient is taking meds as recommended by discharging hospitalist/specialist.  Yes, however we have recommended changes as below    ROS: Constitutional: Denies fever, chills, diaphoresis. HEENT: Denies photophobia, eye pain, redness, hearing loss, ear pain, congestion, sore throat, rhinorrhea, sneezing, mouth sores, trouble swallowing, neck pain, neck stiffness and tinnitus.   Respiratory: Denies  DOE, cough, chest tightness,  and wheezing.   Cardiovascular: Denies chest pain, palpitations and leg swelling.  Gastrointestinal: Denies vomiting, abdominal pain, diarrhea, constipation, blood in stool and abdominal distention.  Genitourinary: Denies dysuria, urgency, frequency, hematuria, flank pain and difficulty urinating.  Endocrine: Denies: hot or cold intolerance, sweats, changes in hair or nails, polyuria, polydipsia. Musculoskeletal: Denies myalgias, back pain, joint swelling, arthralgias and gait problem.  Skin: Denies pallor, rash and wound.  Neurological: Denies  seizures, syncope, numbness and headaches.  Hematological: Denies adenopathy. Easy bruising, personal or family bleeding history  Psychiatric/Behavioral: Denies suicidal ideation, mood changes, confusion, nervousness, sleep disturbance and agitation   Past Medical History:  Diagnosis Date   Arthritis    "joints ache" (01/02/2014)   At risk for sleep apnea    STOP-BANG= 4    SENT TO PCP 12-23-2013   Bladder calculi  Bradycardia    Chronic cystitis    Chronic systolic CHF (congestive heart failure)  (HCC)    a. EF improved to 50-55% in 2018, previously 35-40%   CKD (chronic kidney disease), stage II    Coronary artery disease CARDIOLOGIST-  DR Angelena Form   a. CAD s/p CABG in 1989. b. redo bypass in 1997. c. last cath 2015 - med rx.   Diverticulosis    GERD (gastroesophageal reflux disease)    History of Bell's palsy    RIGHT SIDE-- NO RESIDUAL   Hx of dizziness    Hyperlipidemia    Hypertension    "not anymore" (01/02/2014)   Ischemic cardiomyopathy    Kidney stones "years ago"   "passed them"   Melanoma of ear ([place])    "right"   Mild dementia ([place])    Myocardial infarction ([place]) 1986; 1997   Nocturia    OAB (overactive bladder)    Persistent atrial fibrillation    Prostate cancer ([place]) 1998   S/P  RADIACTIVE SEED IMPLANTS; UROLOGIST-  DR GRAPEY   S/P CABG (coronary artery bypass graft)    1989  X6  &  1998 X5   Sepsis due to urinary tract infection ([place])    Urethral stricture    UTI (urinary tract infection) 09/2015   Wears hearing aid    bilateral   Wears partial dentures     Past Surgical History:  Procedure Laterality Date   CARDIAC CATHETERIZATION  02-22-2005  dr Vidal Schwalbe   mild to moderate lv dysfunction with inferobasilar akinesis/  totally occluded SVG to Intermediate Diagonal and SVG to PDA and RCA branches, totally occluded native coronary circulation with diffuse disease pLAD diagonal system with potentially could be ischemic/  patent SVG to OM with collaterals to dRCA and patent LIMA to LAD and diagonal systemss   CARDIAC CATHETERIZATION  08-28-2013  DR Daneen Schick   widely patent sequential left internal graft to the diagonal/LAD, widely patent SVG to OM with proximal 50% narrowing noted in the graft, total occlusion SVG's to RCA, RI, and the Diagonal/ LV dysfunction with inferobasal aneurysm and mid anterior wall region of akinesis/  overall EF 35-40%/  Total occlusion of the navtive circulation/  no significant change compared to 2006  cath   CARDIOVASCULAR STRESS TEST  08-01-2011  dr Angelena Form   inferior scar and possible soft tissue attenuation with minimal peri-infarct ischemia, small region of anterior ischemia and scar/  LVEF 53% LV wall motion with inferior hypokinesis/ no significant change from scan july 2011   CATARACT EXTRACTION W/ INTRAOCULAR LENS  IMPLANT, BILATERAL Bilateral 2007   CORONARY ARTERY BYPASS GRAFT  11/ 1989  &  10/ 1997   1989-- 6 vessel/  1997 Re-do 5 vessel   CYSTOSCOPY WITH URETHRAL DILATATION N/A 12/25/2013   Procedure: CYSTOSCOPY WITH URETHRAL DILATATION, WITH BIOPSY;  Surgeon: Bernestine Amass, MD;  Location[facility];  Service: Urology;  Laterality: N/A;   CYSTOSCOPY WITH URETHRAL DILATATION N/A 12/22/2014   Procedure: CYSTOSCOPY WITH URETHRAL DILATATION;  Surgeon: Rana Snare, MD;  Location: [facility];  Service: Urology;  Laterality: N/A;  BALLOON DILATION CATHETER     EUS  10/05/2011   Procedure: ESOPHAGEAL ENDOSCOPIC ULTRASOUND (EUS) RADIAL;  Surgeon: Arta Silence, MD;  Location: [facility];  Service: Endoscopy;  Laterality: N/A;   INSERTION OF SUPRAPUBIC CATHETER N/A 12/22/2014   Procedure: INSERTION OF SUPRAPUBIC CATHETER ;  Surgeon: Rana Snare, MD;  Location: [facility];  Service: Urology;  Laterality: N/A;  LAPAROSCOPIC CHOLECYSTECTOMY  12-23-2005   LEFT HEART CATHETERIZATION WITH CORONARY/GRAFT ANGIOGRAM N/A 08/28/2013   Procedure: LEFT HEART CATHETERIZATION WITH Beatrix Fetters;  Surgeon: Sinclair Grooms, MD;  Location: [facility] CATH LAB;  Service: Cardiovascular;  Laterality: N/A;   MELANOMA EXCISION  X 1   "ear"   RADIOACTIVE PROSTATE SEED IMPLANTS  1998   SKIN CANCER EXCISION  X 2   "top of head"   TONSILLECTOMY AND ADENOIDECTOMY  1954    Family History  Problem Relation Age of Onset   Heart disease Mother    Diabetes Father    Heart disease Brother     SOCIAL HX:   reports that he quit smoking about 34 years ago. His smoking use included  cigars. He quit after 40.00 years of use. He quit smokeless tobacco use about 5 years ago.  His smokeless tobacco use included chew. He reports that he does not drink alcohol or use drugs.   Current Outpatient Medications:    acetaminophen (TYLENOL) 500 MG tablet, Take 500 mg by mouth every 6 (six) hours as needed for moderate pain or fever. , Disp: , Rfl:    apixaban (ELIQUIS) 2.5 MG TABS tablet, Take 1 tablet (2.5 mg total) by mouth 2 (two) times daily for 30 days., Disp: 60 tablet, Rfl: 0   atorvastatin (LIPITOR) 80 MG tablet, Take 0.5 tablets (40 mg total) by mouth every evening., Disp: , Rfl:    Calcium Carbonate Antacid (TUMS PO), Take 3 tablets by mouth daily as needed (gas)., Disp: , Rfl:    carvedilol (COREG) 3.125 MG tablet, Take 1 tablet (3.125 mg total) by mouth 2 (two) times daily with a meal., Disp: 60 tablet, Rfl: 2   furosemide (LASIX) 20 MG tablet, Take 1 tablet (20 mg total) by mouth daily., Disp: 30 tablet, Rfl: 2   hydroxypropyl methylcellulose (ISOPTO TEARS) 2.5 % ophthalmic solution, Place 1 drop into both eyes 2 (two) times daily. , Disp: , Rfl:    meclizine (ANTIVERT) 12.5 MG tablet, Take 1 tablet (12.5 mg total) by mouth 3 (three) times daily as needed for dizziness. (Patient not taking: Reported on 08/28/2018), Disp: 90 tablet, Rfl: 0   Multiple Vitamin (MULTIVITAMIN WITH MINERALS) TABS, Take 1 tablet by mouth daily., Disp: , Rfl:    nitroGLYCERIN (NITROSTAT) 0.4 MG SL tablet, Place 0.4 mg under the tongue every 5 (five) minutes as needed for chest pain. , Disp: , Rfl:    omeprazole (PRILOSEC) 40 MG capsule, Take 1 capsule (40 mg total) by mouth daily., Disp: 30 capsule, Rfl: 3   ondansetron (ZOFRAN ODT) 4 MG disintegrating tablet, Take 1 tablet (4 mg total) by mouth every 8 (eight) hours as needed for nausea or vomiting., Disp: 20 tablet, Rfl: 0   Polyethyl Glycol-Propyl Glycol (SYSTANE) 0.4-0.3 % SOLN, Apply 1 drop to eye 3 (three) times daily., Disp: , Rfl:     ranitidine (ZANTAC) 150 MG tablet, Take 1 tablet (150 mg total) by mouth 2 (two) times daily. (Patient not taking: Reported on 08/28/2018), Disp: , Rfl:   EXAM:   VITALS per patient if applicable: Wife reports a blood pressure of 137/74 today  GENERAL: alert, oriented, does not appear to be in acute distress  HEENT: atraumatic, conjunttiva clear, no obvious abnormalities on inspection of external nose and ears  NECK: normal movements of the head and neck  LUNGS: on inspection no signs of respiratory distress, breathing rate appears normal, no obvious gross increased work of breathing, gasping or wheezing  CV: no obvious cyanosis  MS: moves all visible extremities without noticeable abnormality  PSYCH/NEURO: pleasant and cooperative, no obvious depression or anxiety, speech and thought processing grossly intact  ASSESSMENT AND PLAN:   Hospital discharge follow-up  HYPERCHOLESTEROLEMIA  NSTEMI (non-ST elevated myocardial infarction) ([place])  Chronic systolic CHF (congestive heart failure) (HCC) Essential hypertension  Suprapubic catheter (HCC)  Acute renal failure superimposed on chronic kidney disease, unspecified CKD stage, unspecified acute renal failure type ([place])  Gastroesophageal reflux disease without esophagitis  -He appears very weak. -He has multiple complaints that could be cardiac including still shortness of breath, extreme weakness, orthopnea, nausea, dizziness (which is more long-term than other symptoms).  In addition he also has lower extremity pain at rest and with exertion which could indicate claudication. -For the nausea we will give him some Zofran for him to use as needed, he was also taken off omeprazole in the hospital which he had been taking long-term without issues.  As this might be contributing to his nausea, will restart. -As he still has orthopnea and shortness of breath have elected to start low-dose Lasix 20 mg daily. -ACE inhibitor/ARB is  contraindicated due to his renal function. -Will start low-dose carvedilol 3.125 mg twice a day. -Wife indicates that blood pressure today was 137/74 so I suspect he will be able to tolerate the low-dose Lasix and beta-blocker. -He already has cardiology appointment scheduled for 6/25, will send message to cardiology to see if he can be moved forward a little bit. -He would benefit from repeat renal function tests, since this was a telehealth visit I am unable to obtain today. Consider obtaining at next cardiology appointment, otherwise we can schedule him to return for lab visit only. -His main issue seem to be that he is overall not feeling well, main things are nausea and EXTREME weakness.  We will obtain cardiology's recommendations first given his recent non-ST elevated MI, however I suspect that he would be a very poor candidate for any cardiac intervention and suspect that medication management will be all we can do here. -I suspect we will soon need to broach the topic of comfort care with patient and his family, however I will await cardiology opinion in this regard first.   Medical decision making of high complexity was utilized today.    I discussed the assessment and treatment plan with the patient. The patient was provided an opportunity to ask questions and all were answered. The patient agreed with the plan and demonstrated an understanding of the instructions.   The patient was advised to call back or seek an in-person evaluation if the symptoms worsen or if the condition fails to improve as anticipated.    Lelon Frohlich, MD  [facility] at [facility]

## 2018-09-11 NOTE — Telephone Encounter (Signed)
See exchange below. Given his orthopnea and recent renal issues, like needs in-office visit for volume assessment because he will need labs as well. Can you please arrange flex clinic appt this week?      Isaac Bliss, [name], MD  , Nedra Hai, PA-C        I think moving him up would be beneficial. This was a telehealth visit so labs were not done today. Maybe you could order when he sees you or I can have him come in for labs only. Per wife, BP has been in the 130/80 range so I hope he can tolerate lasix and BB, altho I agree that probably won't make much difference overall.   Thanks,  [place]   Previous Messages  ----- Message -----  From: Felipa Evener  Sent: 09/11/2018  1:36 PM EDT  To: Erline Hau, MD   Lequita Asal! Thanks for reaching out.  It looks like Mr. Huseby has been in a rough place recently and I agree with you that the idea of comfort care is worth broaching because he has evidence of multiorgan dysfunction. I've only met him once but when I did, I remember his family was concerned because each time he gets admitted he comes out worse and worse with very little reserve. He has h/o intermittent systolic dysfunction so the EF has been intermittently this low in the past. Agree he's not a cath candidate in the slightest. His dizziness has been a complaint for quite a while (his event monitor from 05/2018 just got mailed to him following the covid shutdowns). He has not previously tolerated beta blockers due to bradycardia - this doesn't seem to be an acute issue and it may be worth trying but I worry that it won't make a huge difference in how he's feeling with his symptoms. I'm assuming you meant below no ACEI due to renal dysfunction. I agree with trial of Lasix if BP is OK. If you hadn't rechecked BMET post-hospitalization it would be worthwhile as he recently had abrupt worsening of his renal function with foley obstruction which could pose a renal cause  for his fluid retention if it's only worsened rather than improved. If you want me to see if our office can move up our f/u I am happy to do so. I worry about his quality of life based on your note and it sounds like you do too. Thanks for keeping me updated and I will also cc to Dr. Angelena Form.   ----- Message -----  From: Isaac Bliss, [name], MD  Sent: 09/11/2018 12:54 PM EDT  To: Charlie Pitter, PA-C   Hey !  Hope you are doing well. I am seeing this patient today as a hospital follow up and I have some concerns. He is scheduled to see you on 6/25. He went to the ED c/o weakness and SOB. Looks like trop peaked at 0.70, ECHO with systolic dysfunction and wall motion abnormalities, but there was no inpatient cards consult. Also no revisement of meds was done :(. He is NOT a good cath candidate at all, but I think a cards perspective here is needed. I have started him today on low-dose Coreg, ACE-I CI due to renal dysfunction. He is already on Eliquis, so will hold off on ASA. Just wanted to make you aware. Today he also has dizziness, nausea and orthopnea (sleeping in a recliner which is new) so will also start lasix. He has pain in  his legs, I suspect PVD/claudication. He asks what we can do to make him feel better. I am considering broaching comfort care with him and family, but wanted to make sure he didn't feel any better by optimizing cardiac meds first and to see what ya'll think. Feel free to forward this to whomever is his primary cardiologist is also.   Thanks,  Domingo Mend

## 2018-09-11 NOTE — Telephone Encounter (Signed)
Call placed to pt re: message below.  Need to schedule an appt in office on [facility] schedule. No answer / No Machine.  Will try again.

## 2018-09-12 NOTE — Telephone Encounter (Signed)
Pt is scheduled to see Cecilie Kicks, NP, 09/13/2018 @ 2:00.  Pt / wife answered both answered no to the pre-covid questions below.  Pt's wife will be accompanying him, due to weakness and unsteady.       COVID-19 Pre-Screening Questions:  . In the past 7 to 10 days have you had a cough,  shortness of breath, headache, congestion, fever (100 or greater) body aches, chills, sore throat, or sudden loss of taste or sense of smell? . Have you been around anyone with known Covid 19. . Have you been around anyone who is awaiting Covid 19 test results in the past 7 to 10 days? . Have you been around anyone who has been exposed to Covid 19, or has mentioned symptoms of Covid 19 within the past 7 to 10 days?  If you have any concerns/questions about symptoms patients report during screening (either on the phone or at threshold). Contact the provider seeing the patient or DOD for further guidance.  If neither are available contact a member of the leadership team.

## 2018-09-13 ENCOUNTER — Ambulatory Visit (INDEPENDENT_AMBULATORY_CARE_PROVIDER_SITE_OTHER): Payer: Medicare Other | Admitting: Cardiology

## 2018-09-13 ENCOUNTER — Other Ambulatory Visit: Payer: Self-pay

## 2018-09-13 ENCOUNTER — Telehealth: Payer: Self-pay | Admitting: Cardiovascular Disease

## 2018-09-13 ENCOUNTER — Encounter: Payer: Self-pay | Admitting: Cardiology

## 2018-09-13 VITALS — BP 142/64 | HR 70 | Ht 65.0 in | Wt 149.1 lb

## 2018-09-13 DIAGNOSIS — I10 Essential (primary) hypertension: Secondary | ICD-10-CM

## 2018-09-13 DIAGNOSIS — E7800 Pure hypercholesterolemia, unspecified: Secondary | ICD-10-CM

## 2018-09-13 DIAGNOSIS — I2510 Atherosclerotic heart disease of native coronary artery without angina pectoris: Secondary | ICD-10-CM | POA: Diagnosis not present

## 2018-09-13 DIAGNOSIS — I255 Ischemic cardiomyopathy: Secondary | ICD-10-CM

## 2018-09-13 DIAGNOSIS — E785 Hyperlipidemia, unspecified: Secondary | ICD-10-CM | POA: Diagnosis not present

## 2018-09-13 DIAGNOSIS — I25810 Atherosclerosis of coronary artery bypass graft(s) without angina pectoris: Secondary | ICD-10-CM

## 2018-09-13 DIAGNOSIS — R0600 Dyspnea, unspecified: Secondary | ICD-10-CM | POA: Diagnosis not present

## 2018-09-13 DIAGNOSIS — I4819 Other persistent atrial fibrillation: Secondary | ICD-10-CM

## 2018-09-13 DIAGNOSIS — D649 Anemia, unspecified: Secondary | ICD-10-CM

## 2018-09-13 DIAGNOSIS — I5022 Chronic systolic (congestive) heart failure: Secondary | ICD-10-CM | POA: Diagnosis not present

## 2018-09-13 MED ORDER — ISOSORBIDE MONONITRATE ER 30 MG PO TB24: 15.0000 mg | ORAL_TABLET | Freq: Every day | ORAL | 3 refills | Status: AC

## 2018-09-13 NOTE — Telephone Encounter (Signed)
  Patient was asked to call back with dosage of his Eliquis that he was put on in the hospital. He is taking 2.5 mg now.

## 2018-09-13 NOTE — Progress Notes (Signed)
Cardiology Office Note   Date:  09/13/2018   ID:  Ronald [name], DOB 07-09-1927, MRN [id number]  PCP:  Isaac Bliss, [name], MD  Cardiologist:  Dr. Angelena Form    Chief Complaint  Patient presents with  . Hospitalization Follow-up      History of Present Illness: Ronald [name] is a 85 y.o. male who presents for post Hospitalization.   He has a history of CAD s/p CABG in 1989 and redo bypass in 9702, chronic systolic CHF with improved EF in 2018, persistent atrial fibrillation, bradycardia, CKD stage II-III per labs, HTN, HLD, mild dementia, prostate cancer, GERD, diverticulosis, Bell's palsy, urinary retention .  His last cath was in 2015 patent LIMA-diag/LAD, patent SVG-OM with proximal 50% narrowing in graft, total occlusion of saphenous vein grafts to the right coronary, the ramus intermedius, and the diagonal, severe native disease - anatomy was not significant changed from 2006 and medical therapy was recommended. His EF was 35-40% by that cath, with last echo in 01/2017 showing septal hypokinesis, moderate basal septal hypertrophy, EF 63-78%, normal diastolic function, mild LAE. He has had several medical encounters for urosepsis with known history of kidney stones and urethral stricture as well as urinary retention. In November 2018 he was found to have atrial fibrillation and was started on Eliquis. Aspirin was stopped. Event monitor 02/2017 showed sinus bradycardia and PAF. At last OV 11/2017 he was in atrial fib and rhythm control was not pursued. He has not previously tolerated beta blockers due to bradycardia, Imdur due to hypotension, or ACEI (unclear reaction, noted in Dr. Camillia Herter note). Last labs 03/2018 showed K 4.8, Cr 1.14, Hgb 13.2, plt 138k, TSH 4.96 (followed by PCP), 04/2017 LDL 76.  Last visit his main complaint is that he has been dizzy for 3 months nearly every single day. Yesterday it lasted all day long, associated with some SOB, not clearly orthostatic in  nature or made worse by anything in particular. No associated palpitations. He seems to be getting weaker with each UTI. He did PT after last hospitalization then graduated from it. He continues to have DOE and intermittent LEE. Also reports legs burn at night. No claudication. He has to walk with a cane.   Pt admitted 08/28/18 with SOB  Also AKI, pk Cr was 3.50, was seen by nephrology and they felt it would improve.   IV hydration,  ahis supra pubic cath was changed prior to admit though I do not see if this was planned or if for a problem. His troponin elevated  troponin pk 0.70 -no chest pain and some SOB - this may be demand ischemia with recurrent a fib and AKI.  His chronic obstructive uropathy requiring chronic suprapubic catheter was stable though at times he has hematuria.   Echo done 08/29/18 with EF 40-45%and may be underestimate.  Appeared to be severe hypokinesis of the basal and mid inferoseptal difficult to see the inf. Wall. diffinity was recommended but not done. He has on other Echoes to have moderate basal septal hypertrophy and septal hypokinesis.   CHADSVASC Score of at least 5. Continue apixaban - started lower dose 2.5 given elevated Cr/depressed GFR as above  Pt states the Antivert has helped with dizziness  Since hospital discharge patient tells me that he still feels very nauseated, has orthopnea to the point where he has now resorted to sleeping in his recliner (this helps).  He also has significant lower extremity pain that wakes him up frequently even  at rest but is definitely worse with even minimal exertion.  He has pain with his Rt ear and some swelling in past on rt side of face.  He tells me he is tired of nausea and zofran does not help.  We discussed other meds may make him drowsy.  Also occ indigestion and if he drinks a pepsi fast and belches it goes away.    He has been unable to take the coreg due to very hard heart beats that make him feel worse and in past has  caused bradycardia.   Past Medical History:  Diagnosis Date  . Arthritis    "joints ache" (01/02/2014)  . At risk for sleep apnea    STOP-BANG= 4    SENT TO PCP 12-23-2013  . Bladder calculi   . Bradycardia   . Chronic cystitis   . Chronic systolic CHF (congestive heart failure) (HCC)    a. EF improved to 50-55% in 2018, previously 35-40%  . CKD (chronic kidney disease), stage II   . Coronary artery disease CARDIOLOGIST-  DR Angelena Form   a. CAD s/p CABG in 1989. b. redo bypass in 1997. c. last cath 2015 - med rx.  . Diverticulosis   . GERD (gastroesophageal reflux disease)   . History of Bell's palsy    RIGHT SIDE-- NO RESIDUAL  . Hx of dizziness   . Hyperlipidemia   . Hypertension    "not anymore" (01/02/2014)  . Ischemic cardiomyopathy   . Kidney stones "years ago"   "passed them"  . Melanoma of ear ([place])    "right"  . Mild dementia ([place])   . Myocardial infarction ([place]) 1986; 1997  . Nocturia   . OAB (overactive bladder)   . Persistent atrial fibrillation   . Prostate cancer ([place]) 1998   S/P  [place]; [place]  . S/P CABG (coronary artery bypass graft)    [place]  . Sepsis due to urinary tract infection ([place])   . Urethral stricture   . UTI (urinary tract infection) 09/2015  . Wears hearing aid    bilateral  . Wears partial dentures     Past Surgical History:  Procedure Laterality Date  . CARDIAC CATHETERIZATION  02-22-2005  dr Vidal Schwalbe   mild to moderate lv dysfunction with inferobasilar akinesis/  totally occluded SVG to Intermediate Diagonal and SVG to PDA and RCA branches, totally occluded native coronary circulation with diffuse disease pLAD diagonal system with potentially could be ischemic/  patent SVG to OM with collaterals to dRCA and patent LIMA to LAD and diagonal systemss  . CARDIAC CATHETERIZATION  08-28-2013  DR Daneen Schick   widely patent sequential left internal graft to the diagonal/LAD, widely patent SVG to OM  with proximal 50% narrowing noted in the graft, total occlusion SVG's to RCA, RI, and the Diagonal/ LV dysfunction with inferobasal aneurysm and mid anterior wall region of akinesis/  overall EF 35-40%/  Total occlusion of the navtive circulation/  no significant change compared to 2006 cath  . CARDIOVASCULAR STRESS TEST  08-01-2011  dr Angelena Form   inferior scar and possible soft tissue attenuation with minimal peri-infarct ischemia, small region of anterior ischemia and scar/  LVEF 53% LV wall motion with inferior hypokinesis/ no significant change from scan july 2011  . CATARACT EXTRACTION W/ INTRAOCULAR LENS  IMPLANT, BILATERAL Bilateral 2007  . CORONARY ARTERY BYPASS GRAFT  11/ 1989  &  10/ 1997  1989-- 6 vessel/  1997 Re-do 5 vessel  . CYSTOSCOPY WITH URETHRAL DILATATION N/A 12/25/2013   Procedure: CYSTOSCOPY WITH URETHRAL DILATATION, WITH BIOPSY;  Surgeon: Bernestine Amass, MD;  Location: [facility];  Service: Urology;  Laterality: N/A;  . CYSTOSCOPY WITH URETHRAL DILATATION N/A 12/22/2014   Procedure: CYSTOSCOPY WITH URETHRAL DILATATION;  Surgeon: Rana Snare, MD;  Location: [facility];  Service: Urology;  Laterality: N/A;  BALLOON DILATION CATHETER    . EUS  10/05/2011   Procedure: ESOPHAGEAL ENDOSCOPIC ULTRASOUND (EUS) RADIAL;  Surgeon: Arta Silence, MD;  Location: [facility];  Service: Endoscopy;  Laterality: N/A;  . INSERTION OF SUPRAPUBIC CATHETER N/A 12/22/2014   Procedure: INSERTION OF SUPRAPUBIC CATHETER ;  Surgeon: Rana Snare, MD;  Location: [facility];  Service: Urology;  Laterality: N/A;  . LAPAROSCOPIC CHOLECYSTECTOMY  12-23-2005  . LEFT HEART CATHETERIZATION WITH CORONARY/GRAFT ANGIOGRAM N/A 08/28/2013   Procedure: LEFT HEART CATHETERIZATION WITH Beatrix Fetters;  Surgeon: Sinclair Grooms, MD;  Location: [facility] C[facility];  Service: Cardiovascular;  Laterality: N/A;  . MELANOMA EXCISION  X 1   "ear"  . RADIOACTIVE PROSTATE SEED IMPLANTS  1998  . SKIN CANCER  EXCISION  X 2   "top of head"  . TONSILLECTOMY AND ADENOIDECTOMY  1954     Current Outpatient Medications  Medication Sig Dispense Refill  . acetaminophen (TYLENOL) 500 MG tablet Take 500 mg by mouth every 6 (six) hours as needed for moderate pain or fever.     Marland Kitchen apixaban (ELIQUIS) 2.5 MG TABS tablet Take 1 tablet (2.5 mg total) by mouth 2 (two) times daily for 30 days. (Patient taking differently: Take 1.25 mg by mouth 2 (two) times daily. Take one half tablet by mouth twice daily.) 60 tablet 0  . atorvastatin (LIPITOR) 80 MG tablet Take 0.5 tablets (40 mg total) by mouth every evening.    . Calcium Carbonate Antacid (TUMS PO) Take 3 tablets by mouth daily as needed (gas).    . carvedilol (COREG) 3.125 MG tablet Take 1 tablet (3.125 mg total) by mouth 2 (two) times daily with a meal. 60 tablet 2  . furosemide (LASIX) 20 MG tablet Take 1 tablet (20 mg total) by mouth daily. 30 tablet 2  . hydroxypropyl methylcellulose (ISOPTO TEARS) 2.5 % ophthalmic solution Place 1 drop into both eyes 2 (two) times daily.     . meclizine (ANTIVERT) 12.5 MG tablet Take 1 tablet (12.5 mg total) by mouth 3 (three) times daily as needed for dizziness. 90 tablet 0  . Multiple Vitamin (MULTIVITAMIN WITH MINERALS) TABS Take 1 tablet by mouth daily.    . nitroGLYCERIN (NITROSTAT) 0.4 MG SL tablet Place 0.4 mg under the tongue every 5 (five) minutes as needed for chest pain.     Marland Kitchen omeprazole (PRILOSEC) 40 MG capsule Take 1 capsule (40 mg total) by mouth daily. 30 capsule 3  . ondansetron (ZOFRAN ODT) 4 MG disintegrating tablet Take 1 tablet (4 mg total) by mouth every 8 (eight) hours as needed for nausea or vomiting. 20 tablet 0  . Polyethyl Glycol-Propyl Glycol (SYSTANE) 0.4-0.3 % SOLN Apply 1 drop to eye 3 (three) times daily.    . ranitidine (ZANTAC) 150 MG tablet Take 1 tablet (150 mg total) by mouth 2 (two) times daily.     No current facility-administered medications for this visit.     Allergies:    Pantoprazole, Nitrofurantoin, Sulfa antibiotics, Lidocaine, and Tape    Social History:  The patient  reports  that he quit smoking about 34 years ago. His smoking use included cigars. He quit after 40.00 years of use. He quit smokeless tobacco use about 5 years ago.  His smokeless tobacco use included chew. He reports that he does not drink alcohol or use drugs.   Family History:  The patient's family history includes Diabetes in his father; Heart disease in his brother and mother.    ROS:  General:no colds or fevers,  weight down 10 lbs since discharge.  Skin:no rashes or ulcers HEENT:no blurred vision, no congestion CV:see HPI PUL:see HPI GI:no diarrhea constipation or melena, occ indigestion GU:occ hematuria, no dysuria suprapubic catheter MS:no joint pain, no claudication but leg pain at times Neuro:no syncope, no lightheadedness Endo:no diabetes, no thyroid disease  Wt Readings from Last 3 Encounters:  09/13/18 149 lb 1.9 oz (67.6 kg)  08/30/18 159 lb (72.1 kg)  07/01/18 154 lb (69.9 kg)     PHYSICAL EXAM: VS:  BP (!) 142/64   Pulse 70   Ht 5\' 5"  (1.651 m)   Wt 149 lb 1.9 oz (67.6 kg)   SpO2 97%   BMI 24.81 kg/m  , BMI Body mass index is 24.81 kg/m. General:Pleasant affect, NAD Skin:Warm and dry, brisk capillary refill HEENT:normocephalic, sclera clear, mucus membranes moist Neck:supple, no JVD, no bruits  Heart:S1S2 RRR without murmur, gallup, rub or click Lungs:clear without rales, rhonchi, or wheezes MPN:TIRW, non tender, + BS, do not palpate liver spleen or masses Ext:no lower ext edema, 2+ pedal pulses, 2+ radial pulses Neuro:alert and oriented X 3, MAE, follows commands, + facial symmetry    EKG:  EKG is ordered today. The ekg ordered today demonstrates A flutter with 4:1 conduction LVH and non specific t wave abnormality in part due to flutter.    Recent Labs: 07/01/2018: ALT 19 08/13/2018: TSH 3.80 08/28/2018: B Natriuretic Peptide 441.0; Magnesium 2.1  09/01/2018: BUN 38; Creatinine, Ser 3.29; Hemoglobin 10.7; Platelets 125; Potassium 4.7; Sodium 145    Lipid Panel    Component Value Date/Time   CHOL 133 06/04/2018 1149   TRIG 64 06/04/2018 1149   HDL 48 06/04/2018 1149   CHOLHDL 2.8 06/04/2018 1149   CHOLHDL 2.6 07/23/2015 0755   VLDL 10 07/23/2015 0755   LDLCALC 72 06/04/2018 1149       Other studies Reviewed: Additional studies/ records that were reviewed today include:  Echo 08/29/18 . IMPRESSIONS    1. The left ventricle has mild-moderately reduced systolic function, with an ejection fraction of 40-45%. The cavity size was normal. There is mildly increased left ventricular wall thickness. Left ventricular diastolic function could not be evaluated  due to nondiagnostic images.  2. Overall LVF appears mildly reduced with EF estimated at 40-45% but this may be an underestimate due to poor acoustical windows in some views. There appears to be severe hypokinesis of the basal and mid inferoseptum but the inferior wall is not well  seen. Recommend definity contrast study for further assessement.  3. The right ventricle has normal systolic function. The cavity was normal. There is no increase in right ventricular wall thickness. Right ventricular systolic pressure could not be assessed.  4. Right atrial size was mildly dilated.  5. The aortic valve is tricuspid. Mild sclerosis of the aortic valve. Aortic valve regurgitation is trivial by color flow Doppler.  6. The interatrial septum appears to be lipomatous.  7. Recommend definity contrast study.  FINDINGS  Left Ventricle: The left ventricle has mild-moderately reduced systolic function, with  an ejection fraction of 40-45%. The cavity size was normal. There is mildly increased left ventricular wall thickness. Left ventricular diastolic function could not  be evaluated due to nondiagnostic images. Overall LVF appears mildly reduced with EF estimated at 40-45% but this may be an  underestimate due to poor acoustical windows in some views. There appears to be severe hypokinesis of the basal and mid  inferoseptum but the inferior wall is not well seen. Recommend definity contrast study for further assessement.  Right Ventricle: The right ventricle has normal systolic function. The cavity was normal. There is no increase in right ventricular wall thickness. Right ventricular systolic pressure could not be assessed.  Left Atrium: Left atrial size was normal in size.  Right Atrium: Right atrial size was mildly dilated. Right atrial pressure is estimated at 3 mmHg.  Interatrial Septum: No atrial level shunt detected by color flow Doppler. Increased thickness of the atrial septum sparing the fossa ovalis consistent with The interatrial septum appears to be lipomatous.  Pericardium: There is no evidence of pericardial effusion.  Mitral Valve: The mitral valve is normal in structure. Mitral valve regurgitation is mild by color flow Doppler.  Tricuspid Valve: The tricuspid valve is normal in structure. Tricuspid valve regurgitation was not visualized by color flow Doppler.  Aortic Valve: The aortic valve is tricuspid Mild sclerosis of the aortic valve. Aortic valve regurgitation is trivial by color flow Doppler.  Pulmonic Valve: The pulmonic valve was normal in structure. Pulmonic valve regurgitation was not assessed by color flow Doppler.  Venous: The inferior vena cava measures 1.77 cm, is normal in size with greater than 50% respiratory variability.  ASSESSMENT AND PLAN:  1.  Dyspnea has to sleep in recliner, last CXR neg for edema, BNP was mildly elevated, he is on lasix 20 mg and with recent dehydration hesitant to increase. Will check Pro BNP today - other concern would be anginal equivalent- I am not sure he had NSTEMI this admit with elevated Cr but he has had echo changes.  Will add imdur 15 mg to see if it helps, he is agreeable.  In apst he did develop  hypotension.  He will follow up with Dr. Angelena Form in 1-2 weeks.    He is not a candidate for cardiac cath with his AKI.  He is intolerant to BB.   His dyspnea may also may be related to his a flutter.  He was in this in hospital and had been in [place] prior to admit.    2.  Persistent a fib he has been in SR until this admit though in past he has been in a fib and asymptomatic so unsure if this is causing his issue.  He is on eliquis now at 2.5 mg BID with his elevated Cr and age.   3.  CAD with hx of CABG and redo CABG  Last cath was in 2015 and severe disease of his native vessels but with VG to OM was patent with mild narrowing.  And OM supplied collaterals to distal RCA, LIMA to LAD and diag is patent.  VG to RCA is occluded and VG to ramus/diagonal is totally occluded.    4.  Cardiomyopathy was EF 35-40% but improved on this last echo.   5.  HLD on statin   6.  Dizziness improved with antivert,  He has carotid dopplers ordered- needs rescheduling.   7.  Nausea this has been chronic for 4 months but very bothersome, zofran is not helping  I have asked then to touch base with his PCP for further management. His AKI may be playing a role.   He does not feel well and is tired of not feeling well.  Discussion should be made for palliative care  - he seems to be ready for this but with so many issues today I did not address.  Labs will be helpful and I will send to Dr. Angelena Form for his recommendations prior to pts visit with him.  Continue lasix I stopped coreg.       Current medicines are reviewed with the patient today.  The patient Has no concerns regarding medicines.  The following changes have been made:  See above Labs/ tests ordered today include:see above  Disposition:   FU:  see above  Signed, Cecilie Kicks, NP  09/13/2018 2:06 PM    [facility] [place], [place], [place] Phone: [phone]; Fax:  [phone]

## 2018-09-13 NOTE — Telephone Encounter (Signed)
Pt's wife called to repot pt is on 5 mg Eliquis and she has been given him 1/2 bid of the 5 mg. Medication has been changed to reflect this in the computer.

## 2018-09-13 NOTE — Patient Instructions (Addendum)
Medication Instructions:  Your physician has recommended you make the following change in your medication:  1.  STOP the Carvedilol (Coreg) 2.  START Imdur 30 mg taking 1/2 tablet daily 3.  PLEASE CONTINUE TO TAKE 1/2 OF THE ELIQUIS 5 MG TWICE A DAY  If you need a refill on your cardiac medications before your next appointment, please call your pharmacy.   Lab work: TODAY:  CMP, PRO BNP, & CBC  If you have labs (blood work) drawn today and your tests are completely normal, you will receive your results only by: Marland Kitchen MyChart Message (if you have MyChart) OR . A paper copy in the mail If you have any lab test that is abnormal or we need to change your treatment, we will call you to review the results.  Testing/Procedures: None ordered  Follow-Up: At [facility], you and your health needs are our priority.  As part of our continuing mission to provide you with exceptional heart care, we have created designated Provider Care Teams.  These Care Teams include your primary Cardiologist (physician) and Advanced Practice Providers (APPs -  Physician Assistants and Nurse Practitioners) who all work together to provide you with the care you need, when you need it. Marland Kitchen You are scheduled for an in-office visit with Dr. Angelena Form on 10/01/2018 at 1:20.  Please arrive 15 mins early for registration  Any Other Special Instructions Will Be Listed Below (If Applicable).

## 2018-09-14 ENCOUNTER — Encounter (HOSPITAL_COMMUNITY): Payer: Self-pay

## 2018-09-14 ENCOUNTER — Telehealth: Payer: Self-pay | Admitting: Cardiology

## 2018-09-14 ENCOUNTER — Other Ambulatory Visit: Payer: Self-pay

## 2018-09-14 ENCOUNTER — Inpatient Hospital Stay (HOSPITAL_COMMUNITY): Payer: Medicare Other

## 2018-09-14 ENCOUNTER — Inpatient Hospital Stay (HOSPITAL_COMMUNITY)
Admission: EM | Admit: 2018-09-14 | Discharge: 2018-09-20 | DRG: 699 | Disposition: A | Discharge status: Home Health | Payer: Medicare Other | Attending: Internal Medicine | Admitting: Internal Medicine

## 2018-09-14 ENCOUNTER — Emergency Department (HOSPITAL_COMMUNITY): Payer: Medicare Other

## 2018-09-14 DIAGNOSIS — H9193 Unspecified hearing loss, bilateral: Secondary | ICD-10-CM | POA: Diagnosis present

## 2018-09-14 DIAGNOSIS — R0602 Shortness of breath: Secondary | ICD-10-CM | POA: Diagnosis not present

## 2018-09-14 DIAGNOSIS — E860 Dehydration: Secondary | ICD-10-CM | POA: Diagnosis present

## 2018-09-14 DIAGNOSIS — N320 Bladder-neck obstruction: Secondary | ICD-10-CM | POA: Diagnosis present

## 2018-09-14 DIAGNOSIS — D631 Anemia in chronic kidney disease: Secondary | ICD-10-CM | POA: Diagnosis not present

## 2018-09-14 DIAGNOSIS — F0390 Unspecified dementia without behavioral disturbance: Secondary | ICD-10-CM | POA: Diagnosis present

## 2018-09-14 DIAGNOSIS — N171 Acute kidney failure with acute cortical necrosis: Secondary | ICD-10-CM | POA: Diagnosis not present

## 2018-09-14 DIAGNOSIS — E872 Acidosis: Secondary | ICD-10-CM | POA: Diagnosis not present

## 2018-09-14 DIAGNOSIS — B9689 Other specified bacterial agents as the cause of diseases classified elsewhere: Secondary | ICD-10-CM | POA: Diagnosis present

## 2018-09-14 DIAGNOSIS — Z951 Presence of aortocoronary bypass graft: Secondary | ICD-10-CM

## 2018-09-14 DIAGNOSIS — N35919 Unspecified urethral stricture, male, unspecified site: Secondary | ICD-10-CM | POA: Diagnosis present

## 2018-09-14 DIAGNOSIS — E785 Hyperlipidemia, unspecified: Secondary | ICD-10-CM | POA: Diagnosis present

## 2018-09-14 DIAGNOSIS — Z20828 Contact with and (suspected) exposure to other viral communicable diseases: Secondary | ICD-10-CM | POA: Diagnosis present

## 2018-09-14 DIAGNOSIS — Z87440 Personal history of urinary (tract) infections: Secondary | ICD-10-CM

## 2018-09-14 DIAGNOSIS — N183 Chronic kidney disease, stage 3 (moderate): Secondary | ICD-10-CM | POA: Diagnosis present

## 2018-09-14 DIAGNOSIS — I2510 Atherosclerotic heart disease of native coronary artery without angina pectoris: Secondary | ICD-10-CM | POA: Diagnosis present

## 2018-09-14 DIAGNOSIS — D649 Anemia, unspecified: Secondary | ICD-10-CM | POA: Diagnosis not present

## 2018-09-14 DIAGNOSIS — T83511A Infection and inflammatory reaction due to indwelling urethral catheter, initial encounter: Secondary | ICD-10-CM | POA: Diagnosis present

## 2018-09-14 DIAGNOSIS — N136 Pyonephrosis: Secondary | ICD-10-CM | POA: Diagnosis present

## 2018-09-14 DIAGNOSIS — I5022 Chronic systolic (congestive) heart failure: Secondary | ICD-10-CM | POA: Diagnosis present

## 2018-09-14 DIAGNOSIS — R195 Other fecal abnormalities: Secondary | ICD-10-CM | POA: Diagnosis present

## 2018-09-14 DIAGNOSIS — D62 Acute posthemorrhagic anemia: Secondary | ICD-10-CM | POA: Diagnosis present

## 2018-09-14 DIAGNOSIS — Z87891 Personal history of nicotine dependence: Secondary | ICD-10-CM

## 2018-09-14 DIAGNOSIS — I472 Ventricular tachycardia: Secondary | ICD-10-CM | POA: Diagnosis not present

## 2018-09-14 DIAGNOSIS — N281 Cyst of kidney, acquired: Secondary | ICD-10-CM | POA: Diagnosis not present

## 2018-09-14 DIAGNOSIS — I214 Non-ST elevation (NSTEMI) myocardial infarction: Secondary | ICD-10-CM

## 2018-09-14 DIAGNOSIS — Z882 Allergy status to sulfonamides status: Secondary | ICD-10-CM

## 2018-09-14 DIAGNOSIS — I252 Old myocardial infarction: Secondary | ICD-10-CM | POA: Diagnosis not present

## 2018-09-14 DIAGNOSIS — K5900 Constipation, unspecified: Secondary | ICD-10-CM | POA: Diagnosis not present

## 2018-09-14 DIAGNOSIS — I130 Hypertensive heart and chronic kidney disease with heart failure and stage 1 through stage 4 chronic kidney disease, or unspecified chronic kidney disease: Secondary | ICD-10-CM | POA: Diagnosis present

## 2018-09-14 DIAGNOSIS — Y846 Urinary catheterization as the cause of abnormal reaction of the patient, or of later complication, without mention of misadventure at the time of the procedure: Secondary | ICD-10-CM | POA: Diagnosis present

## 2018-09-14 DIAGNOSIS — Z79899 Other long term (current) drug therapy: Secondary | ICD-10-CM

## 2018-09-14 DIAGNOSIS — I4891 Unspecified atrial fibrillation: Secondary | ICD-10-CM | POA: Diagnosis not present

## 2018-09-14 DIAGNOSIS — R42 Dizziness and giddiness: Secondary | ICD-10-CM | POA: Diagnosis not present

## 2018-09-14 DIAGNOSIS — N3020 Other chronic cystitis without hematuria: Secondary | ICD-10-CM | POA: Diagnosis present

## 2018-09-14 DIAGNOSIS — I493 Ventricular premature depolarization: Secondary | ICD-10-CM | POA: Diagnosis not present

## 2018-09-14 DIAGNOSIS — Z888 Allergy status to other drugs, medicaments and biological substances status: Secondary | ICD-10-CM

## 2018-09-14 DIAGNOSIS — I255 Ischemic cardiomyopathy: Secondary | ICD-10-CM | POA: Diagnosis present

## 2018-09-14 DIAGNOSIS — N179 Acute kidney failure, unspecified: Secondary | ICD-10-CM | POA: Diagnosis present

## 2018-09-14 DIAGNOSIS — N132 Hydronephrosis with renal and ureteral calculous obstruction: Secondary | ICD-10-CM | POA: Diagnosis not present

## 2018-09-14 DIAGNOSIS — Z7901 Long term (current) use of anticoagulants: Secondary | ICD-10-CM

## 2018-09-14 DIAGNOSIS — Y732 Prosthetic and other implants, materials and accessory gastroenterology and urology devices associated with adverse incidents: Secondary | ICD-10-CM | POA: Diagnosis present

## 2018-09-14 DIAGNOSIS — R079 Chest pain, unspecified: Secondary | ICD-10-CM | POA: Diagnosis not present

## 2018-09-14 DIAGNOSIS — R001 Bradycardia, unspecified: Secondary | ICD-10-CM | POA: Diagnosis not present

## 2018-09-14 DIAGNOSIS — Z923 Personal history of irradiation: Secondary | ICD-10-CM

## 2018-09-14 DIAGNOSIS — K219 Gastro-esophageal reflux disease without esophagitis: Secondary | ICD-10-CM | POA: Diagnosis present

## 2018-09-14 DIAGNOSIS — Z87442 Personal history of urinary calculi: Secondary | ICD-10-CM

## 2018-09-14 DIAGNOSIS — Z8546 Personal history of malignant neoplasm of prostate: Secondary | ICD-10-CM | POA: Diagnosis not present

## 2018-09-14 DIAGNOSIS — N1330 Unspecified hydronephrosis: Secondary | ICD-10-CM | POA: Diagnosis not present

## 2018-09-14 DIAGNOSIS — K5730 Diverticulosis of large intestine without perforation or abscess without bleeding: Secondary | ICD-10-CM | POA: Diagnosis not present

## 2018-09-14 DIAGNOSIS — Z884 Allergy status to anesthetic agent status: Secondary | ICD-10-CM

## 2018-09-14 DIAGNOSIS — Z91048 Other nonmedicinal substance allergy status: Secondary | ICD-10-CM

## 2018-09-14 DIAGNOSIS — I454 Nonspecific intraventricular block: Secondary | ICD-10-CM | POA: Diagnosis present

## 2018-09-14 DIAGNOSIS — I4819 Other persistent atrial fibrillation: Secondary | ICD-10-CM | POA: Diagnosis present

## 2018-09-14 DIAGNOSIS — N189 Chronic kidney disease, unspecified: Secondary | ICD-10-CM | POA: Diagnosis present

## 2018-09-14 HISTORY — DX: Anemia, unspecified: D64.9

## 2018-09-14 LAB — COMPREHENSIVE METABOLIC PANEL
ALT: 15 IU/L (ref 0–44)
ALT: 15 U/L (ref 0–44)
AST: 19 IU/L (ref 0–40)
AST: 20 U/L (ref 15–41)
Albumin/Globulin Ratio: 1.8 (ref 1.2–2.2)
Albumin: 4.2 g/dL (ref 3.6–4.6)
Albumin: 3.5 g/dL (ref 3.5–5.0)
Alkaline Phosphatase: 72 IU/L (ref 39–117)
Alkaline Phosphatase: 61 U/L (ref 38–126)
Anion gap: 13 (ref 5–15)
BUN/Creatinine Ratio: 13 (ref 10–24)
BUN: 70 mg/dL — ABNORMAL HIGH (ref 8–27)
BUN: 76 mg/dL — ABNORMAL HIGH (ref 8–23)
Bilirubin Total: 0.4 mg/dL (ref 0.0–1.2)
CO2: 19 mmol/L — ABNORMAL LOW (ref 20–29)
CO2: 18 mmol/L — ABNORMAL LOW (ref 22–32)
Calcium: 9.4 mg/dL (ref 8.6–10.2)
Calcium: 9.2 mg/dL (ref 8.9–10.3)
Chloride: 103 mmol/L (ref 96–106)
Chloride: 103 mmol/L (ref 98–111)
Creatinine, Ser: 5.35 mg/dL — ABNORMAL HIGH (ref 0.76–1.27)
Creatinine, Ser: 5.51 mg/dL — ABNORMAL HIGH (ref 0.61–1.24)
GFR calc Af Amer: 10 mL/min/{1.73_m2} — ABNORMAL LOW (ref >59)
GFR calc Af Amer: 10 mL/min — ABNORMAL LOW (ref >60)
GFR calc non Af Amer: 9 mL/min/{1.73_m2} — ABNORMAL LOW (ref >59)
GFR calc non Af Amer: 8 mL/min — ABNORMAL LOW (ref >60)
Globulin, Total: 2.3 g/dL (ref 1.5–4.5)
Glucose, Bld: 122 mg/dL — ABNORMAL HIGH (ref 70–99)
Glucose: 130 mg/dL — ABNORMAL HIGH (ref 65–99)
Potassium: 5.3 mmol/L — ABNORMAL HIGH (ref 3.5–5.2)
Potassium: 4.5 mmol/L (ref 3.5–5.1)
Sodium: 139 mmol/L (ref 134–144)
Sodium: 134 mmol/L — ABNORMAL LOW (ref 135–145)
Total Bilirubin: 0.6 mg/dL (ref 0.3–1.2)
Total Protein: 6.5 g/dL (ref 6.0–8.5)
Total Protein: 6.5 g/dL (ref 6.5–8.1)

## 2018-09-14 LAB — CBC
Hematocrit: 28.4 % — ABNORMAL LOW (ref 37.5–51.0)
Hemoglobin: 9.6 g/dL — ABNORMAL LOW (ref 13.0–17.7)
MCH: 30.6 pg (ref 26.6–33.0)
MCHC: 33.8 g/dL (ref 31.5–35.7)
MCV: 90 fL (ref 79–97)
Platelets: 177 10*3/uL (ref 150–450)
RBC: 3.14 x10E6/uL — ABNORMAL LOW (ref 4.14–5.80)
RDW: 14.4 % (ref 11.6–15.4)
WBC: 9.6 10*3/uL (ref 3.4–10.8)

## 2018-09-14 LAB — CBC WITH DIFFERENTIAL/PLATELET
Abs Immature Granulocytes: 0.05 10*3/uL (ref 0.00–0.07)
Basophils Absolute: 0 10*3/uL (ref 0.0–0.1)
Basophils Relative: 0 %
Eosinophils Absolute: 0.1 10*3/uL (ref 0.0–0.5)
Eosinophils Relative: 2 %
HCT: 26.3 % — ABNORMAL LOW (ref 39.0–52.0)
Hemoglobin: 8.6 g/dL — ABNORMAL LOW (ref 13.0–17.0)
Immature Granulocytes: 1 %
Lymphocytes Relative: 12 %
Lymphs Abs: 0.9 10*3/uL (ref 0.7–4.0)
MCH: 30.3 pg (ref 26.0–34.0)
MCHC: 32.7 g/dL (ref 30.0–36.0)
MCV: 92.6 fL (ref 80.0–100.0)
Monocytes Absolute: 0.9 10*3/uL (ref 0.1–1.0)
Monocytes Relative: 12 %
Neutro Abs: 5.7 10*3/uL (ref 1.7–7.7)
Neutrophils Relative %: 73 %
Platelets: 147 10*3/uL — ABNORMAL LOW (ref 150–400)
RBC: 2.84 MIL/uL — ABNORMAL LOW (ref 4.22–5.81)
RDW: 14.6 % (ref 11.5–15.5)
WBC: 7.8 10*3/uL (ref 4.0–10.5)
nRBC: 0 % (ref 0.0–0.2)

## 2018-09-14 LAB — URINALYSIS, ROUTINE W REFLEX MICROSCOPIC
Bilirubin Urine: NEGATIVE
Glucose, UA: NEGATIVE mg/dL
Ketones, ur: NEGATIVE mg/dL
Nitrite: NEGATIVE
Protein, ur: NEGATIVE mg/dL
Specific Gravity, Urine: 1.004 — ABNORMAL LOW (ref 1.005–1.030)
pH: 6 (ref 5.0–8.0)

## 2018-09-14 LAB — TROPONIN I: Troponin I: <0.03 ng/mL (ref <0.03)

## 2018-09-14 LAB — POC OCCULT BLOOD, ED: Fecal Occult Bld: POSITIVE — AB

## 2018-09-14 LAB — PRO B NATRIURETIC PEPTIDE: NT-Pro BNP: 10359 pg/mL — ABNORMAL HIGH (ref 0–486)

## 2018-09-14 MED ORDER — ACETAMINOPHEN 650 MG RE SUPP: 650.0000 mg | Freq: Four times a day (QID) | RECTAL | Status: DC | PRN

## 2018-09-14 MED ORDER — ATORVASTATIN CALCIUM 40 MG PO TABS
40.0000 mg | ORAL_TABLET | Freq: Every evening | ORAL | Status: DC
  Administered 2018-09-14 – 2018-09-19 (×6): 40 mg via ORAL
  Filled 2018-09-14 (×7): qty 1

## 2018-09-14 MED ORDER — ONDANSETRON 4 MG PO TBDP: 4.0000 mg | ORAL_TABLET | Freq: Three times a day (TID) | ORAL | Status: DC | PRN

## 2018-09-14 MED ORDER — POLYETHYL GLYCOL-PROPYL GLYCOL 0.4-0.3 % OP SOLN: 1.0000 [drp] | Freq: Three times a day (TID) | OPHTHALMIC | Status: DC

## 2018-09-14 MED ORDER — POLYVINYL ALCOHOL 1.4 % OP SOLN
1.0000 [drp] | Freq: Three times a day (TID) | OPHTHALMIC | Status: DC
  Administered 2018-09-14 – 2018-09-20 (×16): 1 [drp] via OPHTHALMIC
  Filled 2018-09-14 (×2): qty 15

## 2018-09-14 MED ORDER — PANTOPRAZOLE SODIUM 40 MG PO TBEC: 40.0000 mg | DELAYED_RELEASE_TABLET | Freq: Every day | ORAL | Status: DC

## 2018-09-14 MED ORDER — CEFTRIAXONE SODIUM 1 G IJ SOLR: 1.0000 g | INTRAMUSCULAR | Status: DC

## 2018-09-14 MED ORDER — ACETAMINOPHEN 500 MG PO TABS: 500.0000 mg | ORAL_TABLET | Freq: Four times a day (QID) | ORAL | Status: DC | PRN

## 2018-09-14 MED ORDER — MECLIZINE HCL 25 MG PO TABS: 12.5000 mg | ORAL_TABLET | Freq: Three times a day (TID) | ORAL | Status: DC | PRN

## 2018-09-14 MED ORDER — PANTOPRAZOLE SODIUM 40 MG IV SOLR
40.0000 mg | Freq: Two times a day (BID) | INTRAVENOUS | Status: DC
  Administered 2018-09-14 – 2018-09-15 (×2): 40 mg via INTRAVENOUS
  Filled 2018-09-14 (×3): qty 40

## 2018-09-14 MED ORDER — ADULT MULTIVITAMIN W/MINERALS CH
1.0000 | ORAL_TABLET | Freq: Every day | ORAL | Status: DC
  Administered 2018-09-14 – 2018-09-20 (×7): 1 via ORAL
  Filled 2018-09-14 (×7): qty 1

## 2018-09-14 MED ORDER — NITROGLYCERIN 0.4 MG SL SUBL: 0.4000 mg | SUBLINGUAL_TABLET | SUBLINGUAL | Status: DC | PRN

## 2018-09-14 MED ORDER — ISOSORBIDE MONONITRATE ER 30 MG PO TB24
15.0000 mg | ORAL_TABLET | Freq: Every day | ORAL | Status: DC
  Administered 2018-09-14 – 2018-09-20 (×7): 15 mg via ORAL
  Filled 2018-09-14 (×7): qty 1

## 2018-09-14 MED ORDER — SODIUM CHLORIDE 0.9 % IV BOLUS
500.0000 mL | Freq: Once | INTRAVENOUS | Status: AC
  Administered 2018-09-14: 500 mL via INTRAVENOUS

## 2018-09-14 MED ORDER — SODIUM CHLORIDE 0.9 % IV SOLN
1.0000 g | INTRAVENOUS | Status: DC
  Administered 2018-09-14 – 2018-09-16 (×3): 1 g via INTRAVENOUS
  Filled 2018-09-14 (×3): qty 10
  Filled 2018-09-14: qty 1

## 2018-09-14 MED ORDER — ACETAMINOPHEN 325 MG PO TABS
650.0000 mg | ORAL_TABLET | Freq: Four times a day (QID) | ORAL | Status: DC | PRN
  Administered 2018-09-15 – 2018-09-20 (×8): 650 mg via ORAL
  Filled 2018-09-14 (×8): qty 2

## 2018-09-14 MED ORDER — ACETAMINOPHEN 500 MG PO TABS
1000.0000 mg | ORAL_TABLET | Freq: Once | ORAL | Status: AC
  Administered 2018-09-14: 1000 mg via ORAL
  Filled 2018-09-14: qty 2

## 2018-09-14 NOTE — ED Provider Notes (Signed)
[name] Provider Note   CSN: [id number] Arrival date & time: 09/14/18  0814    History   Chief Complaint Chief Complaint  Patient presents with  . Abnormal Lab    HPI Ronald [name] is a 85 y.o. male with PMHx CAD s/p CABG, GERD, CKD, hx prostate cancer with suprapubic catheter who presents to the ED for abnormal lab value. Pt reports he received a call from his cardiologist today about elevated creatinine level at 5.3 and told to come to the ED. Currently patient only complains of pain around his suprapubic catheter but reports it has been there since it was placed years ago.   Per chart review:  He was recently seen in the ED on 06/02 for multiple symptoms including dizziness, lightheadedness, shortness of breath, issues with his foley draining. Pt found to have AKI at that time with creatinine 3.28 as well as elevated troponin 0.52 likely from demand ischemia (peaked at 0.70); he was admitted to the hospital at that time.   Discharged on 06/06 after receiving 3 day course of ceftriaxone for positive urine culture with 30,000 colonies of S. Aureus. Creatinine uptrended to max 3.50 but was downtrending at time of discharge although 3.29 at time of discharge; nephrology following and reported that since it was downtrending patient was able to be followed outpatient.   Pt had telemedicine visit with PCP on 06/16 after discharge from hospital; at time of visit patient was complaining of nausea, orthopnea, LE pain bilaterally. He had an appt with cards scheduled for 06/25 but advised to see if they could see him sooner given complaints. Pt started on Zofran for his nausea (this complaint appears to be ongoing for the past 4 months) with unknown cause although he does have hx of GERD on omeprazole.   Yesterday pt saw cardiology in office; given complaint of orthopnea a pro BNP was ordered. CMET also ordered during visit; called this AM to let patient know  of elevated creatinine level.        Past Medical History:  Diagnosis Date  . Anemia 09/14/2018  . Arthritis    "joints ache" (01/02/2014)  . At risk for sleep apnea    STOP-BANG= 4    SENT TO PCP 12-23-2013  . Bladder calculi   . Bradycardia   . Chronic cystitis   . Chronic systolic CHF (congestive heart failure) (HCC)    a. EF improved to 50-55% in 2018, previously 35-40%  . CKD (chronic kidney disease), stage II   . Coronary artery disease CARDIOLOGIST-  DR Angelena Form   a. CAD s/p CABG in 1989. b. redo bypass in 1997. c. last cath 2015 - med rx.  . Diverticulosis   . GERD (gastroesophageal reflux disease)   . History of Bell's palsy    RIGHT SIDE-- NO RESIDUAL  . Hx of dizziness   . Hyperlipidemia   . Hypertension    "not anymore" (01/02/2014)  . Ischemic cardiomyopathy   . Kidney stones "years ago"   "passed them"  . Melanoma of ear ([place])    "right"  . Mild dementia ([place])   . Myocardial infarction ([place]) 1986; 1997  . Nocturia   . OAB (overactive bladder)   . Persistent atrial fibrillation   . Prostate cancer ([place]) 1998   S/P  [place]; [place]  . S/P CABG (coronary artery bypass graft)    [place]  . Sepsis  due to urinary tract infection ([place])   . Urethral stricture   . UTI (urinary tract infection) 09/2015  . Wears hearing aid    bilateral  . Wears partial dentures     Patient Active Problem List   Diagnosis Date Noted  . Acute renal failure (ARF) ([place]) 09/14/2018  . Elevated troponin 08/28/2018  . Normocytic anemia 08/28/2018  . UTI (urinary tract infection) 03/06/2018  . Chronic diastolic CHF (congestive heart failure) ([place]) 12/26/2017  . PAF (paroxysmal atrial fibrillation) ([place]) 12/26/2017  . Fever 05/11/2017  . AKI (acute kidney injury) ([place])   . CKD (chronic kidney disease), stage III ([place])   . Bacteremia due to Pseudomonas 01/21/2016  . Debility 01/21/2016  . History of Bell's palsy   . Gastroesophageal  reflux disease without esophagitis   . Coronary artery disease involving coronary bypass graft of native heart without angina pectoris   . History of prostate cancer   . Suprapubic catheter ([place])   . Incontinence of feces   . Renal failure syndrome   . Bacteremia   . NSTEMI (non-ST elevated myocardial infarction) ([place]) 01/16/2016  . ARF (acute renal failure) ([place]) 10/13/2015  . Influenza A 12/05/2014  . Urinary tract infection associated with cystostomy catheter ([place]) 10/03/2014  . Acute lower UTI 09/10/2014  . Bacteremia due to Enterococcus 01/22/2014  . Chronic systolic CHF (congestive heart failure) ([place]) 13-Feb-202015  . Urethral stricture 12/25/2013  . Bladder calculus 12/25/2013  . LV dysfunction 09/23/2013  . Unstable angina ([place]) 08/27/2013  . Bacteremia due to Escherichia coli 05/20/2012  . Urinary tract infection 09/15/2011  . Calculus of kidney 09/12/2011  . HYPERCHOLESTEROLEMIA 10/18/2007  . HTN (hypertension) 10/18/2007  . OSTEOARTHRITIS 02/07/2007  . BELL'S PALSY, RIGHT 10/26/2006  . Coronary atherosclerosis 10/26/2006  . PROSTATE CANCER, HX OF 10/26/2006  . NEPHROLITHIASIS, HX OF 10/26/2006    Past Surgical History:  Procedure Laterality Date  . CARDIAC CATHETERIZATION  02-22-2005  dr Vidal Schwalbe   mild to moderate lv dysfunction with inferobasilar akinesis/  totally occluded SVG to Intermediate Diagonal and SVG to PDA and RCA branches, totally occluded native coronary circulation with diffuse disease pLAD diagonal system with potentially could be ischemic/  patent SVG to OM with collaterals to dRCA and patent LIMA to LAD and diagonal systemss  . CARDIAC CATHETERIZATION  08-28-2013  DR Daneen Schick   widely patent sequential left internal graft to the diagonal/LAD, widely patent SVG to OM with proximal 50% narrowing noted in the graft, total occlusion SVG's to RCA, RI, and the Diagonal/ LV dysfunction with inferobasal aneurysm and mid anterior wall region of akinesis/   overall EF 35-40%/  Total occlusion of the navtive circulation/  no significant change compared to 2006 cath  . CARDIOVASCULAR STRESS TEST  08-01-2011  dr Angelena Form   inferior scar and possible soft tissue attenuation with minimal peri-infarct ischemia, small region of anterior ischemia and scar/  LVEF 53% LV wall motion with inferior hypokinesis/ no significant change from scan july 2011  . CATARACT EXTRACTION W/ INTRAOCULAR LENS  IMPLANT, BILATERAL Bilateral 2007  . CORONARY ARTERY BYPASS GRAFT  11/ [place]-- 6 vessel/  1997 Re-do 5 vessel  . CYSTOSCOPY WITH URETHRAL DILATATION N/A 12/25/2013   Procedure: CYSTOSCOPY WITH URETHRAL DILATATION, WITH BIOPSY;  Surgeon: Bernestine Amass, MD;  Location: [facility];  Service: Urology;  Laterality: N/A;  . CYSTOSCOPY WITH URETHRAL DILATATION N/A 12/22/2014   Procedure: CYSTOSCOPY WITH URETHRAL DILATATION;  Surgeon: Rana Snare, MD;  Location: [facility];  Service: Urology;  Laterality: N/A;  BALLOON DILATION CATHETER    . EUS  10/05/2011   Procedure: ESOPHAGEAL ENDOSCOPIC ULTRASOUND (EUS) RADIAL;  Surgeon: Arta Silence, MD;  Location: [facility];  Service: Endoscopy;  Laterality: N/A;  . INSERTION OF SUPRAPUBIC CATHETER N/A 12/22/2014   Procedure: INSERTION OF SUPRAPUBIC CATHETER ;  Surgeon: Rana Snare, MD;  Location: [facility];  Service: Urology;  Laterality: N/A;  . LAPAROSCOPIC CHOLECYSTECTOMY  12-23-2005  . LEFT HEART CATHETERIZATION WITH CORONARY/GRAFT ANGIOGRAM N/A 08/28/2013   Procedure: LEFT HEART CATHETERIZATION WITH Beatrix Fetters;  Surgeon: Sinclair Grooms, MD;  Location: [facility] CATH LAB;  Service: Cardiovascular;  Laterality: N/A;  . MELANOMA EXCISION  X 1   "ear"  . RADIOACTIVE PROSTATE SEED IMPLANTS  1998  . SKIN CANCER EXCISION  X 2   "top of head"  . TONSILLECTOMY AND ADENOIDECTOMY  1954        Home Medications    Prior to Admission medications   Medication Sig Start Date End Date Taking?  Authorizing Provider  acetaminophen (TYLENOL) 500 MG tablet Take 500 mg by mouth every 6 (six) hours as needed for moderate pain or fever.    Yes [provider]  apixaban (ELIQUIS) 5 MG TABS tablet Take 2.5 mg by mouth 2 (two) times daily.   Yes [provider]  atorvastatin (LIPITOR) 80 MG tablet Take 0.5 tablets (40 mg total) by mouth every evening. 05/24/17  Yes Marletta Lor, MD  Calcium Carbonate Antacid (TUMS PO) Take 3 tablets by mouth daily as needed (gas).   Yes [provider]  furosemide (LASIX) 20 MG tablet Take 1 tablet (20 mg total) by mouth daily. 09/11/18  Yes Isaac Bliss, [name], MD  isosorbide mononitrate (IMDUR) 30 MG 24 hr tablet Take 0.5 tablets (15 mg total) by mouth daily. 09/13/18 12/12/18 Yes Isaiah Serge, NP  meclizine (ANTIVERT) 12.5 MG tablet Take 1 tablet (12.5 mg total) by mouth 3 (three) times daily as needed for dizziness. 06/11/18  Yes Isaac Bliss, [name], MD  Multiple Vitamin (MULTIVITAMIN WITH MINERALS) TABS Take 1 tablet by mouth daily.   Yes [provider]  nitroGLYCERIN (NITROSTAT) 0.4 MG SL tablet Place 0.4 mg under the tongue every 5 (five) minutes as needed for chest pain.    Yes [provider]  omeprazole (PRILOSEC) 40 MG capsule Take 1 capsule (40 mg total) by mouth daily. 09/11/18  Yes Isaac Bliss, [name], MD  ondansetron (ZOFRAN ODT) 4 MG disintegrating tablet Take 1 tablet (4 mg total) by mouth every 8 (eight) hours as needed for nausea or vomiting. 09/11/18  Yes Isaac Bliss, [name], MD  Polyethyl Glycol-Propyl Glycol (SYSTANE) 0.4-0.3 % SOLN Apply 1 drop to eye 3 (three) times daily.   Yes [provider]  ranitidine (ZANTAC) 150 MG tablet Take 1 tablet (150 mg total) by mouth 2 (two) times daily. Patient not taking: Reported on 09/14/2018 05/18/16   Burnell Blanks, MD    Family History Family History  Problem Relation Age of Onset  . Heart disease Mother    . Diabetes Father   . Heart disease Brother     Social History Social History   Tobacco Use  . Smoking status: Former Smoker    Years: 40.00    Types: Cigars    Quit date: 12/24/1983    Years since quitting: 34.7  . Smokeless tobacco: Former Systems developer  Types: Sarina Ser    Quit date: 08/28/2013  . Tobacco comment: smoked a pipe  Substance Use Topics  . Alcohol use: No  . Drug use: No     Allergies   Pantoprazole, Nitrofurantoin, Sulfa antibiotics, Lidocaine, and Tape   Review of Systems Review of Systems  Constitutional: Positive for fever (subjective).  HENT: Negative for congestion.   Eyes: Negative for visual disturbance.  Respiratory: Positive for shortness of breath.   Cardiovascular: Negative for chest pain.  Gastrointestinal: Negative for abdominal pain, nausea and vomiting.  Genitourinary:       + leakage from suprapubic cath  Musculoskeletal: Negative for myalgias.  Skin: Negative for rash.  Neurological: Positive for weakness (generalized).     Physical Exam Updated Vital Signs BP (!) 148/70   Pulse 70   Temp 98.2 F (36.8 C)   Resp 14   Ht 5\' 5"  (1.651 m)   Wt 67.6 kg   SpO2 100%   BMI 24.81 kg/m   Physical Exam Vitals signs and nursing note reviewed.  Constitutional:      Appearance: He is not toxic-appearing.  HENT:     Head: Normocephalic and atraumatic.  Eyes:     Conjunctiva/sclera: Conjunctivae normal.  Neck:     Musculoskeletal: Neck supple.  Cardiovascular:     Rate and Rhythm: Normal rate and regular rhythm.     Pulses: Normal pulses.  Pulmonary:     Effort: Pulmonary effort is normal.     Breath sounds: Normal breath sounds. No wheezing, rhonchi or rales.  Abdominal:     Palpations: Abdomen is soft.     Tenderness: There is no abdominal tenderness. There is no guarding or rebound.     Comments: Suprapubic cath in place; mild tenderness around cath (patient reports this is chronic)  Musculoskeletal: Normal range of motion.      Right lower leg: No edema.     Left lower leg: No edema.  Skin:    General: Skin is warm and dry.  Neurological:     Mental Status: He is alert. Mental status is at baseline.      ED Treatments / Results  Labs (all labs ordered are listed, but only abnormal results are displayed) Labs Reviewed  COMPREHENSIVE METABOLIC PANEL - Abnormal; Notable for the following components:      Result Value   Sodium 134 (*)    CO2 18 (*)    Glucose, Bld 122 (*)    BUN 76 (*)    Creatinine, Ser 5.51 (*)    GFR calc non Af Amer 8 (*)    GFR calc Af Amer 10 (*)    All other components within normal limits  CBC WITH DIFFERENTIAL/PLATELET - Abnormal; Notable for the following components:   RBC 2.84 (*)    Hemoglobin 8.6 (*)    HCT 26.3 (*)    Platelets 147 (*)    All other components within normal limits  URINALYSIS, ROUTINE W REFLEX MICROSCOPIC - Abnormal; Notable for the following components:   APPearance HAZY (*)    Specific Gravity, Urine 1.004 (*)    Hgb urine dipstick LARGE (*)    Leukocytes,Ua LARGE (*)    Bacteria, UA RARE (*)    All other components within normal limits  POC OCCULT BLOOD, ED - Abnormal; Notable for the following components:   Fecal Occult Bld POSITIVE (*)    All other components within normal limits  URINE CULTURE  NOVEL CORONAVIRUS, NAA (HOSPITAL ORDER, SEND-OUT TO  REF LAB)  TROPONIN I  OCCULT BLOOD X 1 CARD TO LAB, STOOL    EKG None  Radiology Dg Chest Port 1 View  Result Date: 09/14/2018 CLINICAL DATA:  Chest pain and abnormal labs in [facility] vascular center. EXAM: PORTABLE CHEST 1 VIEW COMPARISON:  Chest x-rays dated 08/28/2018, 07/01/2018 and 02/10/2009. FINDINGS: Heart size and mediastinal contours are stable. Median sternotomy wires in place, for presumed CABG. Mild bibasilar scarring/atelectasis. Lungs otherwise clear. No pleural effusion or pneumothorax seen. No acute or suspicious osseous finding. Bilateral carotid atherosclerosis. IMPRESSION: 1. No  active disease. No evidence of pneumonia or pulmonary edema. 2. Carotid atherosclerosis. Electronically Signed   By: Franki Cabot M.D.   On: 09/14/2018 10:39    Procedures Procedures (including critical care time)  Medications Ordered in ED Medications  atorvastatin (LIPITOR) tablet 40 mg (has no administration in time range)  isosorbide mononitrate (IMDUR) 24 hr tablet 15 mg (has no administration in time range)  nitroGLYCERIN (NITROSTAT) SL tablet 0.4 mg (has no administration in time range)  meclizine (ANTIVERT) tablet 12.5 mg (has no administration in time range)  ondansetron (ZOFRAN-ODT) disintegrating tablet 4 mg (has no administration in time range)  multivitamin with minerals tablet 1 tablet (has no administration in time range)  pantoprazole (PROTONIX) injection 40 mg (has no administration in time range)  acetaminophen (TYLENOL) tablet 650 mg (has no administration in time range)    Or  acetaminophen (TYLENOL) suppository 650 mg (has no administration in time range)  polyvinyl alcohol (LIQUIFILM TEARS) 1.4 % ophthalmic solution 1 drop (has no administration in time range)  cefTRIAXone (ROCEPHIN) 1 g in sodium chloride 0.9 % 100 mL IVPB (has no administration in time range)  sodium chloride 0.9 % bolus 500 mL (0 mLs Intravenous Stopped 09/14/18 1305)  acetaminophen (TYLENOL) tablet 1,000 mg (1,000 mg Oral Given 09/14/18 1301)     Initial Impression / Assessment and Plan / ED Course  I have reviewed the triage vital signs and the nursing notes.  Pertinent labs & imaging results that were available during my care of the patient were reviewed by me and considered in my medical decision making (see chart for details).  85 year old male presenting for elevated creatinine level obtained by cardiology yesterday; 5.3. Recently discharged from the hospital (see HPI for further details) for AKI. Highest in the hospital was creatinine 3.5; downtrended to 3.2 upon discharge. Baseline prior  to previous admission 1.5; cause unknown. Wife reports that pt's suprapubic cath appeared to be leaking today. Bladder scan with 11 mL; not concerned for obstructive process today. Urine appears clear but patient recently treated with 3 days ceftriaxone in the hospital for UTI; this appears to be a chronic finding for patient as well. Will get baseline bloodwork today; pt will likely need to be admitted if creatinine is truly that high.   Creatinine 5.51 today; potassium within normal limits. Trop negative. Hgb is 8.6; CBC taken yesterday at card 9.6; patient not complaining of any BRBPR or melena; will obtain fecal occult today.   Fecal occult positive. Will call hospitalist for admission at this time.     Clinical Course as of Sep 13 1544  Fri Sep 14, 2018  0953 Called wife to obtain further history; saw by cardiologist yesterday; did bloodwork. Cards called this AM; told that there was a "problem with his kidneys" and told to come to the ED.    [MV]  1101 Urine appears infected although unchanged from previous; recently was  treated with 3 days ceftriaxone while in the hospital; urine culture sent today  Urinalysis, Routine w reflex microscopic(!) [MV]  1102 Hgb level dropped 1 point from labs obtained yesterday; concern for bleeding; will check fecal occult  Hemoglobin(!): 8.6 [MV]  1102 Creatinine elevated 5.51; 5.3 yesterday at cards office. 500 CC fluid bolus given. Bladder scan 11 mL  Creatinine(!): 5.51 [MV]  1200 Positive  POC occult blood, ED(!) [MV]  1212 Discussed case with hospitalist who will admit patient; would like to have nephrology on board. Will consult so they are aware.    [MV]  1219 Discussed case with Nephrologist, Dr. Grayland Ormond who is aware of patient and will evaluate   [MV]    Clinical Course User Index [MV] Eustaquio Maize, PA-C         Final Clinical Impressions(s) / ED Diagnoses   Final diagnoses:  Symptomatic anemia  Acute renal failure, unspecified  acute renal failure type [facility])    ED Discharge Orders    None       Eustaquio Maize, PA-C 09/14/18 [place], MD 09/14/18 1621

## 2018-09-14 NOTE — ED Triage Notes (Signed)
Patient reports he was to come to [facility] because his lab work said j"bad kidneys"

## 2018-09-14 NOTE — ED Notes (Signed)
ED TO INPATIENT HANDOFF REPORT  ED Nurse Name and Phone #: Benjamine Mola [phone]  S Name/Age/Gender Ronald [name] 85 y.o. male Room/Bed: 028C/028C  Code Status   Code Status: Prior  Home/SNF/Other Home Patient oriented to: self, place, time and situation Is this baseline? Yes   Triage Complete: Triage complete  Chief Complaint kidneys  Triage Note Patient reports he was to come to Er because his lab work said j"bad kidneys"   Allergies Allergies  Allergen Reactions  . Pantoprazole Other (See Comments)    Headache and lightheaded  . Nitrofurantoin Hives    dizziness and lightheaded   . Sulfa Antibiotics Hives and Itching  . Lidocaine Swelling    Mouth swelling   . Tape Other (See Comments)    TAPE WILL TEAR THE SKIN!!!    Level of Care/Admitting Diagnosis ED Disposition    ED Disposition Condition Comment   Admit  Hospital Area: [facility] [[id number]]  Level of Care: Telemetry Medical [104]  Covid Evaluation: N/A  Diagnosis: Acute renal failure (ARF) [facility]) [[id number]]  Admitting Physician: Merton Border Marshal.Browner  Attending Physician: Laren Everts, [place]  Estimated length of stay: past midnight tomorrow  Certification:: I certify this patient will need inpatient services for at least 2 midnights  PT Class (Do Not Modify): Inpatient [101]  PT Acc Code (Do Not Modify): Private [1]       B Medical/Surgery History Past Medical History:  Diagnosis Date  . Arthritis    "joints ache" (01/02/2014)  . At risk for sleep apnea    STOP-BANG= 4    SENT TO PCP 12-23-2013  . Bladder calculi   . Bradycardia   . Chronic cystitis   . Chronic systolic CHF (congestive heart failure) (HCC)    a. EF improved to 50-55% in 2018, previously 35-40%  . CKD (chronic kidney disease), stage II   . Coronary artery disease CARDIOLOGIST-  DR Angelena Form   a. CAD s/p CABG in 1989. b. redo bypass in 1997. c. last cath 2015 - med rx.  . Diverticulosis   . GERD (gastroesophageal  reflux disease)   . History of Bell's palsy    RIGHT SIDE-- NO RESIDUAL  . Hx of dizziness   . Hyperlipidemia   . Hypertension    "not anymore" (01/02/2014)  . Ischemic cardiomyopathy   . Kidney stones "years ago"   "passed them"  . Melanoma of ear ([place])    "right"  . Mild dementia ([place])   . Myocardial infarction ([place]) 1986; 1997  . Nocturia   . OAB (overactive bladder)   . Persistent atrial fibrillation   . Prostate cancer ([place]) 1998   S/P  [place]; [place]  . S/P CABG (coronary artery bypass graft)    [place]  . Sepsis due to urinary tract infection ([place])   . Urethral stricture   . UTI (urinary tract infection) 09/2015  . Wears hearing aid    bilateral  . Wears partial dentures    Past Surgical History:  Procedure Laterality Date  . CARDIAC CATHETERIZATION  02-22-2005  dr Vidal Schwalbe   mild to moderate lv dysfunction with inferobasilar akinesis/  totally occluded SVG to Intermediate Diagonal and SVG to PDA and RCA branches, totally occluded native coronary circulation with diffuse disease pLAD diagonal system with potentially could be ischemic/  patent SVG to OM with collaterals to dRCA and patent LIMA to LAD and diagonal systemss  . CARDIAC  CATHETERIZATION  08-28-2013  DR Daneen Schick   widely patent sequential left internal graft to the diagonal/LAD, widely patent SVG to OM with proximal 50% narrowing noted in the graft, total occlusion SVG's to RCA, RI, and the Diagonal/ LV dysfunction with inferobasal aneurysm and mid anterior wall region of akinesis/  overall EF 35-40%/  Total occlusion of the navtive circulation/  no significant change compared to 2006 cath  . CARDIOVASCULAR STRESS TEST  08-01-2011  dr Angelena Form   inferior scar and possible soft tissue attenuation with minimal peri-infarct ischemia, small region of anterior ischemia and scar/  LVEF 53% LV wall motion with inferior hypokinesis/ no significant change from scan july 2011  .  CATARACT EXTRACTION W/ INTRAOCULAR LENS  IMPLANT, BILATERAL Bilateral 2007  . CORONARY ARTERY BYPASS GRAFT  11/ [place]-- 6 vessel/  1997 Re-do 5 vessel  . CYSTOSCOPY WITH URETHRAL DILATATION N/A 12/25/2013   Procedure: CYSTOSCOPY WITH URETHRAL DILATATION, WITH BIOPSY;  Surgeon: Bernestine Amass, MD;  [facility];  Service: Urology;  Laterality: N/A;  . CYSTOSCOPY WITH URETHRAL DILATATION N/A 12/22/2014   Procedure: CYSTOSCOPY WITH URETHRAL DILATATION;  Surgeon: Rana Snare, MD;  [facility];  Service: Urology;  Laterality: N/A;  BALLOON DILATION CATHETER    . EUS  10/05/2011   Procedure: ESOPHAGEAL ENDOSCOPIC ULTRASOUND (EUS) RADIAL;  Surgeon: Arta Silence, MD;  [facility];  Service: Endoscopy;  Laterality: N/A;  . INSERTION OF SUPRAPUBIC CATHETER N/A 12/22/2014   Procedure: INSERTION OF SUPRAPUBIC CATHETER ;  Surgeon: Rana Snare, MD;  [facility];  Service: Urology;  Laterality: N/A;  . LAPAROSCOPIC CHOLECYSTECTOMY  12-23-2005  . LEFT HEART CATHETERIZATION WITH CORONARY/GRAFT ANGIOGRAM N/A 08/28/2013   Procedure: LEFT HEART CATHETERIZATION WITH Beatrix Fetters;  Surgeon: Sinclair Grooms, MD;  Location: [facility] CATH LAB;  Service: Cardiovascular;  Laterality: N/A;  . MELANOMA EXCISION  X 1   "ear"  . RADIOACTIVE PROSTATE SEED IMPLANTS  1998  . SKIN CANCER EXCISION  X 2   "top of head"  . TONSILLECTOMY AND ADENOIDECTOMY  1954     A IV Location/Drains/Wounds Patient Lines/Drains/Airways Status   Active Line/Drains/Airways    Name:   Placement date:   Placement time:   Site:   Days:   Peripheral IV 09/14/18 Left Forearm   09/14/18    1158    Forearm   less than 1   Suprapubic Catheter Latex 16 Fr.   03/06/18    0920    Latex   192   Suprapubic Catheter   -    -    -             Intake/Output Last 24 hours  Intake/Output Summary (Last 24 hours) at 09/14/2018 1311 Last data filed at 09/14/2018 1305 Gross per 24 hour   Intake 500 ml  Output 325 ml  Net 175 ml    Labs/Imaging Results for orders placed or performed during the hospital encounter of 09/14/18 (from the past 48 hour(s))  Comprehensive metabolic panel     Status: Abnormal   Collection Time: 09/14/18  9:49 AM  Result Value Ref Range   Sodium 134 (L) 135 - 145 mmol/L   Potassium 4.5 3.5 - 5.1 mmol/L   Chloride 103 98 - 111 mmol/L   CO2 18 (L) 22 - 32 mmol/L   Glucose, Bld 122 (H) 70 - 99 mg/dL   BUN 76 (H) 8 - 23  mg/dL   Creatinine, Ser 5.51 (H) 0.61 - 1.24 mg/dL   Calcium 9.2 8.9 - 10.3 mg/dL   Total Protein 6.5 6.5 - 8.1 g/dL   Albumin 3.5 3.5 - 5.0 g/dL   AST 20 15 - 41 U/L   ALT 15 0 - 44 U/L   Alkaline Phosphatase 61 38 - 126 U/L   Total Bilirubin 0.6 0.3 - 1.2 mg/dL   GFR calc non Af Amer 8 (L) >60 mL/min   GFR calc Af Amer 10 (L) >60 mL/min   Anion gap 13 5 - 15    Comment: Performed at [facility] [address] [place], [place] [address]  CBC with Differential     Status: Abnormal   Collection Time: 09/14/18  9:49 AM  Result Value Ref Range   WBC 7.8 4.0 - 10.5 K/uL   RBC 2.84 (L) 4.22 - 5.81 MIL/uL   Hemoglobin 8.6 (L) 13.0 - 17.0 g/dL   HCT 26.3 (L) 39.0 - 52.0 %   MCV 92.6 80.0 - 100.0 fL   MCH 30.3 26.0 - 34.0 pg   MCHC 32.7 30.0 - 36.0 g/dL   RDW 14.6 11.5 - 15.5 %   Platelets 147 (L) 150 - 400 K/uL   nRBC 0.0 0.0 - 0.2 %   Neutrophils Relative % 73 %   Neutro Abs 5.7 1.7 - 7.7 K/uL   Lymphocytes Relative 12 %   Lymphs Abs 0.9 0.7 - 4.0 K/uL   Monocytes Relative 12 %   Monocytes Absolute 0.9 0.1 - 1.0 K/uL   Eosinophils Relative 2 %   Eosinophils Absolute 0.1 0.0 - 0.5 K/uL   Basophils Relative 0 %   Basophils Absolute 0.0 0.0 - 0.1 K/uL   Immature Granulocytes 1 %   Abs Immature Granulocytes 0.05 0.00 - 0.07 K/uL    Comment: Performed at [facility] [address], [place], [place] [address]  Troponin I - Once     Status: None   Collection Time: 09/14/18  9:49 AM  Result Value Ref  Range   Troponin I <0.03 <0.03 ng/mL    Comment: Performed at [facility] [address] [place], [place] [address]  Urinalysis, Routine w reflex microscopic     Status: Abnormal   Collection Time: 09/14/18 10:03 AM  Result Value Ref Range   Color, Urine YELLOW YELLOW   APPearance HAZY (A) CLEAR   Specific Gravity, Urine 1.004 (L) 1.005 - 1.030   pH 6.0 5.0 - 8.0   Glucose, UA NEGATIVE NEGATIVE mg/dL   Hgb urine dipstick LARGE (A) NEGATIVE   Bilirubin Urine NEGATIVE NEGATIVE   Ketones, ur NEGATIVE NEGATIVE mg/dL   Protein, ur NEGATIVE NEGATIVE mg/dL   Nitrite NEGATIVE NEGATIVE   Leukocytes,Ua LARGE (A) NEGATIVE   RBC / HPF 21-50 0 - 5 RBC/hpf   WBC, UA 21-50 0 - 5 WBC/hpf   Bacteria, UA RARE (A) NONE SEEN   Mucus PRESENT     Comment: Performed at [facility], [address] [place], [place] [address]  POC occult blood, ED     Status: Abnormal   Collection Time: 09/14/18 11:49 AM  Result Value Ref Range   Fecal Occult Bld POSITIVE (A) NEGATIVE   Dg Chest Port 1 View  Result Date: 09/14/2018 CLINICAL DATA:  Chest pain and abnormal labs in Heart and vascular center. EXAM: PORTABLE CHEST 1 VIEW COMPARISON:  Chest x-rays dated 08/28/2018, 07/01/2018 and 02/10/2009. FINDINGS: Heart  size and mediastinal contours are stable. Median sternotomy wires in place, for presumed CABG. Mild bibasilar scarring/atelectasis. Lungs otherwise clear. No pleural effusion or pneumothorax seen. No acute or suspicious osseous finding. Bilateral carotid atherosclerosis. IMPRESSION: 1. No active disease. No evidence of pneumonia or pulmonary edema. 2. Carotid atherosclerosis. Electronically Signed   By: Franki Cabot M.D.   On: 09/14/2018 10:39    Pending Labs Unresulted Labs (From admission, onward)    Start     Ordered   09/14/18 1229  Novel Coronavirus,NAA,(SEND-OUT TO REF LAB - TAT 24-48 hrs); Hosp Order  (Asymptomatic Patients Labs)  Once,   STAT    Question:  Rule Out  Answer:  Yes    09/14/18 1228   09/14/18 1119  Occult blood card to lab, stool Provider will collect  Once,   STAT    Question:  Specimen to be collected by?  Answer:  Provider will collect   09/14/18 1118   09/14/18 1039  Urine culture  ONCE - STAT,   STAT     09/14/18 1038   Signed and Held  Basic metabolic panel  Tomorrow morning,   R     Signed and Held   Signed and Held  CBC  Tomorrow morning,   R     Signed and Held          Vitals/Pain Today's Vitals   09/14/18 0949 09/14/18 1132 09/14/18 1302 09/14/18 1304  BP:    (!) 162/84  Pulse:    72  Resp:    14  Temp: 98.2 F (36.8 C)     SpO2:    100%  Weight:      Height:      PainSc:  6  6      Isolation Precautions No active isolations  Medications Medications  sodium chloride 0.9 % bolus 500 mL (0 mLs Intravenous Stopped 09/14/18 1305)  acetaminophen (TYLENOL) tablet 1,000 mg (1,000 mg Oral Given 09/14/18 1301)    Mobility walks with person assist Low fall risk        R Recommendations: See Admitting Provider Note  Report given to:   Additional Notes: Pt is [facility]

## 2018-09-14 NOTE — Telephone Encounter (Signed)
I notified pt's wife of pt's need to go to ER for further management.  He is now also leaking from supra pubic catheter.  possible obstruction.  They were agreeable.

## 2018-09-14 NOTE — H&P (Signed)
[facility]                                                                                    Patient Demographics  Ronald [name], is a 85 y.o. male  CSN: [id number]  MRN: [id number]  DOB - [birth year]  Admit Date - 09/14/2018  Outpatient Primary MD for the patient is Isaac Bliss, [name], MD   With History of -  Past Medical History:  Diagnosis Date  . Arthritis    "joints ache" (01/02/2014)  . At risk for sleep apnea    STOP-BANG= 4    SENT TO PCP 12-23-2013  . Bladder calculi   . Bradycardia   . Chronic cystitis   . Chronic systolic CHF (congestive heart failure) (HCC)    a. EF improved to 50-55% in 2018, previously 35-40%  . CKD (chronic kidney disease), stage II   . Coronary artery disease CARDIOLOGIST-  DR Angelena Form   a. CAD s/p CABG in 1989. b. redo bypass in 1997. c. last cath 2015 - med rx.  . Diverticulosis   . GERD (gastroesophageal reflux disease)   . History of Bell's palsy    RIGHT SIDE-- NO RESIDUAL  . Hx of dizziness   . Hyperlipidemia   . Hypertension    "not anymore" (01/02/2014)  . Ischemic cardiomyopathy   . Kidney stones "years ago"   "passed them"  . Melanoma of ear ([place])    "right"  . Mild dementia ([place])   . Myocardial infarction ([place]) 1986; 1997  . Nocturia   . OAB (overactive bladder)   . Persistent atrial fibrillation   . Prostate cancer ([place]) 1998   S/P  [place]; [place]  . S/P CABG (coronary artery bypass graft)    [place]  . Sepsis due to urinary tract infection ([place])   . Urethral stricture   . UTI (urinary tract infection) 09/2015  . Wears hearing aid    bilateral  . Wears partial dentures       Past Surgical History:  Procedure Laterality Date  . CARDIAC CATHETERIZATION  02-22-2005  dr Vidal Schwalbe   mild to moderate lv dysfunction with inferobasilar akinesis/  totally occluded SVG to Intermediate Diagonal and SVG to PDA and RCA branches, totally occluded native  coronary circulation with diffuse disease pLAD diagonal system with potentially could be ischemic/  patent SVG to OM with collaterals to dRCA and patent LIMA to LAD and diagonal systemss  . CARDIAC CATHETERIZATION  08-28-2013  DR Daneen Schick   widely patent sequential left internal graft to the diagonal/LAD, widely patent SVG to OM with proximal 50% narrowing noted in the graft, total occlusion SVG's to RCA, RI, and the Diagonal/ LV dysfunction with inferobasal aneurysm and mid anterior wall region of akinesis/  overall EF 35-40%/  Total occlusion of the navtive circulation/  no significant change compared to 2006 cath  . CARDIOVASCULAR STRESS TEST  08-01-2011  dr Angelena Form   inferior scar and possible soft tissue attenuation with minimal peri-infarct ischemia, small region of anterior ischemia and scar/  LVEF 53% LV wall motion with inferior hypokinesis/  no significant change from scan july 2011  . CATARACT EXTRACTION W/ INTRAOCULAR LENS  IMPLANT, BILATERAL Bilateral 2007  . CORONARY ARTERY BYPASS GRAFT  11/ [place]-- 6 vessel/  1997 Re-do 5 vessel  . CYSTOSCOPY WITH URETHRAL DILATATION N/A 12/25/2013   Procedure: CYSTOSCOPY WITH URETHRAL DILATATION, WITH BIOPSY;  Surgeon: Bernestine Amass, MD;  Location: [facility];  Service: Urology;  Laterality: N/A;  . CYSTOSCOPY WITH URETHRAL DILATATION N/A 12/22/2014   Procedure: CYSTOSCOPY WITH URETHRAL DILATATION;  Surgeon: Rana Snare, MD;  [facility];  Service: Urology;  Laterality: N/A;  BALLOON DILATION CATHETER    . EUS  10/05/2011   Procedure: ESOPHAGEAL ENDOSCOPIC ULTRASOUND (EUS) RADIAL;  Surgeon: Arta Silence, MD;  [facility];  Service: Endoscopy;  Laterality: N/A;  . INSERTION OF SUPRAPUBIC CATHETER N/A 12/22/2014   Procedure: INSERTION OF SUPRAPUBIC CATHETER ;  Surgeon: Rana Snare, MD;  [facility];  Service: Urology;  Laterality: N/A;  . LAPAROSCOPIC CHOLECYSTECTOMY  12-23-2005  . LEFT  HEART CATHETERIZATION WITH CORONARY/GRAFT ANGIOGRAM N/A 08/28/2013   Procedure: LEFT HEART CATHETERIZATION WITH Beatrix Fetters;  Surgeon: Sinclair Grooms, MD;  Location: [facility] CATH LAB;  Service: Cardiovascular;  Laterality: N/A;  . MELANOMA EXCISION  X 1   "ear"  . RADIOACTIVE PROSTATE SEED IMPLANTS  1998  . SKIN CANCER EXCISION  X 2   "top of head"  . [place]    in for   Chief Complaint  Patient presents with  . Abnormal Lab     HPI  Ronald [name]  is a 85 y.o. male, with past medical history significant for CAD status post CABG, GERD, CKD, history of prostate cancer with suprapubic catheter was sent by his cardiologist today for evaluation after labs yesterday showed worsening renal failure with a creatinine of 5.3, acute on chronic anemia was weakness. Patient denies any chest pain , shortness of breath, nausea vomiting or diarrhea. Room the patient had a creatinine of 5.51, potassium 4.5, hemoglobin 8.6 and a guaiac positive stools. Patient had multiple discussions in the past for goals of care.  He lives with his wife at home    Review of Systems    In addition to the HPI above,  No Fever-chills, No Headache, No changes with Vision or hearing, No problems swallowing food or Liquids, No Chest pain, Cough or Shortness of Breath, No  No Nausea or Vommitting, Bowel movements are regular, No Blood in stool or Urine, No dysuria, No new skin rashes or bruises, No new joints pains-aches,  No new weakness, tingling, numbness in any extremity, No recent weight gain or loss, No polyuria, polydypsia or polyphagia, No significant Mental Stressors.  A full 10 point Review of Systems was done, except as stated above, all other Review of Systems were negative.   Social History Social History   Tobacco Use  . Smoking status: Former Smoker    Years: 40.00    Types: Cigars    Quit date: 12/24/1983    Years since quitting: 34.7  . Smokeless  tobacco: Former Systems developer    Types: Chew    Quit date: 08/28/2013  . Tobacco comment: smoked a pipe  Substance Use Topics  . Alcohol use: No     Family History Family History  Problem Relation Age of Onset  . Heart disease Mother   . Diabetes Father   . Heart disease Brother  Prior to Admission medications   Medication Sig Start Date End Date Taking? Authorizing Provider  acetaminophen (TYLENOL) 500 MG tablet Take 500 mg by mouth every 6 (six) hours as needed for moderate pain or fever.    Yes [provider]  apixaban (ELIQUIS) 5 MG TABS tablet Take 2.5 mg by mouth 2 (two) times daily.   Yes [provider]  atorvastatin (LIPITOR) 80 MG tablet Take 0.5 tablets (40 mg total) by mouth every evening. 05/24/17  Yes Marletta Lor, MD  Calcium Carbonate Antacid (TUMS PO) Take 3 tablets by mouth daily as needed (gas).   Yes [provider]  furosemide (LASIX) 20 MG tablet Take 1 tablet (20 mg total) by mouth daily. 09/11/18  Yes Isaac Bliss, [name], MD  isosorbide mononitrate (IMDUR) 30 MG 24 hr tablet Take 0.5 tablets (15 mg total) by mouth daily. 09/13/18 12/12/18 Yes Isaiah Serge, NP  meclizine (ANTIVERT) 12.5 MG tablet Take 1 tablet (12.5 mg total) by mouth 3 (three) times daily as needed for dizziness. 06/11/18  Yes Isaac Bliss, [name], MD  Multiple Vitamin (MULTIVITAMIN WITH MINERALS) TABS Take 1 tablet by mouth daily.   Yes [provider]  nitroGLYCERIN (NITROSTAT) 0.4 MG SL tablet Place 0.4 mg under the tongue every 5 (five) minutes as needed for chest pain.    Yes [provider]  omeprazole (PRILOSEC) 40 MG capsule Take 1 capsule (40 mg total) by mouth daily. 09/11/18  Yes Isaac Bliss, [name], MD  ondansetron (ZOFRAN ODT) 4 MG disintegrating tablet Take 1 tablet (4 mg total) by mouth every 8 (eight) hours as needed for nausea or vomiting. 09/11/18  Yes Isaac Bliss, [name], MD  Polyethyl Glycol-Propyl Glycol  (SYSTANE) 0.4-0.3 % SOLN Apply 1 drop to eye 3 (three) times daily.   Yes [provider]  ranitidine (ZANTAC) 150 MG tablet Take 1 tablet (150 mg total) by mouth 2 (two) times daily. Patient not taking: Reported on 09/14/2018 05/18/16   Burnell Blanks, MD    Allergies  Allergen Reactions  . Pantoprazole Other (See Comments)    Headache and lightheaded  . Nitrofurantoin Hives    dizziness and lightheaded   . Sulfa Antibiotics Hives and Itching  . Lidocaine Swelling    Mouth swelling   . Tape Other (See Comments)    TAPE WILL TEAR THE SKIN!!!    Physical Exam  Vitals  Blood pressure (!) 162/84, pulse 72, temperature 98.2 F (36.8 C), resp. rate 14, height 5\' 5"  (1.651 m), weight 67.6 kg, SpO2 100 %.   1. General elderly male, chronically ill, no acute distress, extremely pleasant  2. Normal affect and insight, Not Suicidal or Homicidal, Awake Alert, Oriented X 3.  3. No F.N deficits, grossly, patient moving all extremities.  4. Ears and Eyes appear Normal, Conjunctivae clear,.  5. Supple Neck, No JVD, .  6. Symmetrical Chest wall movement, Good air movement bilaterally, CTAB.  7. RRR, No Gallops, Rubs or Murmurs, No Parasternal Heave.  8. Positive Bowel Sounds, Abdomen Soft, suprapubic catheter in place.  9.  No Cyanosis, Normal Skin Turgor, No Skin Rash or Bruise.  10. Good muscle tone,  joints appear normal , no effusions, Normal ROM.    Data Review  CBC Recent Labs  Lab 09/13/18 1503 09/14/18 0949  WBC 9.6 7.8  HGB 9.6* 8.6*  HCT 28.4* 26.3*  PLT 177 147*  MCV 90 92.6  MCH 30.6 30.3  MCHC 33.8 32.7  RDW 14.4 14.6  LYMPHSABS  --  0.9  MONOABS  --  0.9  EOSABS  --  0.1  BASOSABS  --  0.0   ------------------------------------------------------------------------------------------------------------------  Chemistries  Recent Labs  Lab 09/13/18 1503 09/14/18 0949  NA 139 134*  K 5.3* 4.5  CL 103 103  CO2 19* 18*  GLUCOSE  130* 122*  BUN 70* 76*  CREATININE 5.35* 5.51*  CALCIUM 9.4 9.2  AST 19 20  ALT 15 15  ALKPHOS 72 61  BILITOT 0.4 0.6   ------------------------------------------------------------------------------------------------------------------ estimated creatinine clearance is 7.9 mL/min (A) (by C-G formula based on SCr of 5.51 mg/dL (H)). ------------------------------------------------------------------------------------------------------------------ No results for input(s): TSH, T4TOTAL, T3FREE, THYROIDAB in the last 72 hours.  Invalid input(s): FREET3   Coagulation profile No results for input(s): INR, PROTIME in the last 168 hours. ------------------------------------------------------------------------------------------------------------------- No results for input(s): DDIMER in the last 72 hours. -------------------------------------------------------------------------------------------------------------------  Cardiac Enzymes Recent Labs  Lab 09/14/18 0949  TROPONINI <0.03   ------------------------------------------------------------------------------------------------------------------ Invalid input(s): POCBNP   ---------------------------------------------------------------------------------------------------------------  Urinalysis    Component Value Date/Time   COLORURINE YELLOW 09/14/2018 1003   APPEARANCEUR HAZY (A) 09/14/2018 1003   LABSPEC 1.004 (L) 09/14/2018 1003   PHURINE 6.0 09/14/2018 1003   GLUCOSEU NEGATIVE 09/14/2018 1003   HGBUR LARGE (A) 09/14/2018 1003   BILIRUBINUR NEGATIVE 09/14/2018 1003   BILIRUBINUR neg 06/30/2014 1530   KETONESUR NEGATIVE 09/14/2018 1003   PROTEINUR NEGATIVE 09/14/2018 1003   UROBILINOGEN 0.2 12/04/2014 2344   NITRITE NEGATIVE 09/14/2018 1003   LEUKOCYTESUR LARGE (A) 09/14/2018 1003    ----------------------------------------------------------------------------------------------------------------  Imaging results:   Dg  Chest 2 View  Result Date: 08/28/2018 CLINICAL DATA:  Shortness of breath. EXAM: CHEST - 2 VIEW COMPARISON:  Radiographs of July 01, 2018. FINDINGS: Stable cardiomediastinal silhouette. Status post coronary bypass graft. No pneumothorax or pleural effusion is noted. Right lung is clear. Minimal left basilar subsegmental atelectasis or scarring is noted. Bony thorax is unremarkable. IMPRESSION: Minimal left basilar subsegmental atelectasis or scarring. Electronically Signed   By: Marijo Conception M.D.   On: 08/28/2018 16:01   Dg Chest Port 1 View  Result Date: 09/14/2018 CLINICAL DATA:  Chest pain and abnormal labs in Heart and vascular center. EXAM: PORTABLE CHEST 1 VIEW COMPARISON:  Chest x-rays dated 08/28/2018, 07/01/2018 and 02/10/2009. FINDINGS: Heart size and mediastinal contours are stable. Median sternotomy wires in place, for presumed CABG. Mild bibasilar scarring/atelectasis. Lungs otherwise clear. No pleural effusion or pneumothorax seen. No acute or suspicious osseous finding. Bilateral carotid atherosclerosis. IMPRESSION: 1. No active disease. No evidence of pneumonia or pulmonary edema. 2. Carotid atherosclerosis. Electronically Signed   By: Franki Cabot M.D.   On: 09/14/2018 10:39      Assessment & Plan  Acute renal failure on chronic, stage V.  Worsening IV fluids Nephrology consulted  UTI Start Rocephin  Anemia, acute blood loss/GI bleed with positive guaiac Patient is on Eliquis, placed on hold Gastroenterology consulted  Systolic congestive heart failure, compensated  History of prostate cancer with chronic indwelling suprapubic catheter   DVT Prophylaxis SCD  AM Labs Ordered, also please review Full Orders  Family Communication:  Code Status full  Disposition Plan: TBD  Time spent in minutes : 42 minutes  Condition GUARDED   @SIGNATURE @

## 2018-09-14 NOTE — Progress Notes (Signed)
NEW ADMISSION NOTE New Admission Note:   Arrival Method: stretcher from ED  Mental Orientation: AxOx3 Telemetry: box  Assessment: Completed Skin:see assessment  [id number]  Pain: denies  Tubes: subrapublic foley cath  Safety Measures: Safety Fall Prevention Plan has been discussed- low bed in place, call light and phone within reach. Bed alarm on  Admission: Completed [facility] Orientation: Patient has been orientated to the room, unit and staff.  Family: none at bedside   Orders have been reviewed and implemented. Will continue to monitor the patient. Call light has been placed within reach and bed alarm has been activated.   Paulla Fore, RN

## 2018-09-14 NOTE — Consult Note (Signed)
[facility] Consult  Referring Provider: Merton Border, MD/[facility] Primary Care Physician:  Isaac Bliss, [name], MD Primary Gastroenterologist: Althia Forts  Reason for Consultation: Anemia, guaiac positive stools  HPI: Ronald [name] is a 85 y.o. male sent in for admission due to abnormal labs showing worsening renal function, rising creatinine to 5.34, BUN 78, GFR 9 along with mild drop in hemoglobin from 10.7 to 8.6 and FOBT positive stools.  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  Patient uses a cane at home, lives appears independent and lives at home with his wife. He is not aware of black stools or blood in stool.  He reports that he does not look at his stool.  He states he had a colonoscopy several years ago, does not remember having an endoscopy. He has mild acid reflux and is on omeprazole 40 mg a day at home with good control of symptoms.  Denies difficulty swallowing or pain on swallowing. Denies unintentional weight loss, abdominal or rectal pain.    Past Medical History:  Diagnosis Date  . Anemia 09/14/2018  . Arthritis    "joints ache" (01/02/2014)  . At risk for sleep apnea    STOP-BANG= 4    SENT TO PCP 12-23-2013  . Bladder calculi   . Bradycardia   . Chronic cystitis   . Chronic systolic CHF (congestive heart failure) (HCC)    a. EF improved to 50-55% in 2018, previously 35-40%  . CKD (chronic kidney disease), stage II   . Coronary artery disease CARDIOLOGIST-  DR Angelena Form   a. CAD s/p CABG in 1989.  b. redo bypass in 1997. c. last cath 2015 - med rx.  . Diverticulosis   . GERD (gastroesophageal reflux disease)   . History of Bell's palsy    RIGHT SIDE-- NO RESIDUAL  . Hx of dizziness   . Hyperlipidemia   . Hypertension    "not anymore" (01/02/2014)  . Ischemic cardiomyopathy   . Kidney stones "years ago"   "passed them"  . Melanoma of ear ([place])    "right"  . Mild dementia ([place])   . Myocardial infarction ([place]) 1986; 1997  . Nocturia   . OAB (overactive bladder)   . Persistent atrial fibrillation   . Prostate cancer ([place]) 1998   S/P  [place]; [place]  . S/P CABG (coronary artery bypass graft)    [place]  . Sepsis due to urinary tract infection ([place])   . Urethral stricture   . UTI (urinary tract infection) 09/2015  . Wears hearing aid    bilateral  . Wears partial dentures     Past Surgical History:  Procedure Laterality Date  . CARDIAC CATHETERIZATION  02-22-2005  dr Vidal Schwalbe   mild to moderate lv dysfunction with inferobasilar akinesis/  totally occluded SVG to Intermediate Diagonal and SVG to PDA and RCA branches, totally occluded native coronary circulation with diffuse disease pLAD diagonal system with potentially could be ischemic/  patent SVG to OM with collaterals to dRCA and patent LIMA to LAD and diagonal systemss  . CARDIAC CATHETERIZATION  08-28-2013  DR Daneen Schick   widely patent sequential left internal graft to the diagonal/LAD, widely patent SVG to OM with proximal 50% narrowing noted in the graft, total occlusion SVG's to RCA, RI, and the Diagonal/ LV dysfunction with inferobasal aneurysm and mid anterior wall region of akinesis/  overall EF 35-40%/  Total occlusion of the navtive circulation/  no significant change compared to 2006 cath  . CARDIOVASCULAR STRESS TEST  08-01-2011  dr Angelena Form   inferior scar and possible soft tissue attenuation with minimal peri-infarct ischemia, small region of anterior ischemia and  scar/  LVEF 53% LV wall motion with inferior hypokinesis/ no significant change from scan july 2011  . CATARACT EXTRACTION W/ INTRAOCULAR LENS  IMPLANT, BILATERAL Bilateral 2007  . CORONARY ARTERY BYPASS GRAFT  11/ [place]-- 6 vessel/  1997 Re-do 5 vessel  . CYSTOSCOPY WITH URETHRAL DILATATION N/A 12/25/2013   Procedure: CYSTOSCOPY WITH URETHRAL DILATATION, WITH BIOPSY;  Surgeon: Bernestine Amass, MD;  Location: [facility];  Service: Urology;  Laterality: N/A;  . CYSTOSCOPY WITH URETHRAL DILATATION N/A 12/22/2014   Procedure: CYSTOSCOPY WITH URETHRAL DILATATION;  Surgeon: Rana Snare, MD;  Location: [facility];  Service: Urology;  Laterality: N/A;  BALLOON DILATION CATHETER    . EUS  10/05/2011   Procedure: ESOPHAGEAL ENDOSCOPIC ULTRASOUND (EUS) RADIAL;  Surgeon: Arta Silence, MD;  Location: [facility];  Service: Endoscopy;  Laterality: N/A;  . INSERTION OF SUPRAPUBIC CATHETER N/A 12/22/2014   Procedure: INSERTION OF SUPRAPUBIC CATHETER ;  Surgeon: Rana Snare, MD;  Location: [facility];  Service: Urology;  Laterality: N/A;  . LAPAROSCOPIC CHOLECYSTECTOMY  12-23-2005  . LEFT HEART CATHETERIZATION WITH CORONARY/GRAFT ANGIOGRAM N/A 08/28/2013   Procedure: LEFT HEART CATHETERIZATION WITH Beatrix Fetters;  Surgeon: Sinclair Grooms, MD;  Location: [facility] CATH LAB;  Service: Cardiovascular;  Laterality: N/A;  . MELANOMA EXCISION  X 1   "ear"  . RADIOACTIVE PROSTATE SEED IMPLANTS  1998  . SKIN CANCER EXCISION  X 2   "top of head"  . [place]    Prior to Admission medications   Medication Sig Start Date End Date Taking? Authorizing Provider  acetaminophen (TYLENOL) 500 MG tablet Take 500 mg by mouth every 6 (six) hours as needed for moderate pain or fever.    Yes [provider]  apixaban (ELIQUIS) 5 MG TABS tablet Take 2.5 mg by mouth 2 (two) times daily.   Yes [provider]  atorvastatin (LIPITOR) 80 MG tablet Take  0.5 tablets (40 mg total) by mouth every evening. 05/24/17  Yes Marletta Lor, MD  Calcium Carbonate Antacid (TUMS PO) Take 3 tablets by mouth daily as needed (gas).   Yes [provider]  furosemide (LASIX) 20 MG tablet Take 1 tablet (20 mg total) by mouth daily. 09/11/18  Yes Isaac Bliss, [name], MD  isosorbide mononitrate (IMDUR) 30 MG 24 hr tablet Take 0.5 tablets (15 mg total) by mouth daily. 09/13/18 12/12/18 Yes Isaiah Serge, NP  meclizine (ANTIVERT) 12.5 MG tablet Take 1 tablet (12.5 mg total) by mouth 3 (three) times daily as needed for dizziness. 06/11/18  Yes Isaac Bliss, [name], MD  Multiple Vitamin (MULTIVITAMIN WITH MINERALS) TABS Take 1 tablet by mouth daily.   Yes [provider]  nitroGLYCERIN (NITROSTAT) 0.4 MG SL tablet Place 0.4 mg under the tongue every 5 (five) minutes as needed for chest pain.    Yes [provider]  omeprazole (PRILOSEC) 40 MG capsule Take 1 capsule (40 mg total) by mouth daily. 09/11/18  Yes Isaac Bliss, [name], MD  ondansetron (ZOFRAN ODT) 4 MG disintegrating tablet Take 1 tablet (4 mg total) by mouth every 8 (eight) hours as needed for nausea or vomiting. 09/11/18  Yes Isaac Bliss, [name], MD  Polyethyl Glycol-Propyl Glycol (SYSTANE) 0.4-0.3 % SOLN Apply 1 drop to eye 3 (three) times daily.   Yes [provider]  ranitidine (ZANTAC) 150 MG tablet Take 1 tablet (150 mg total) by mouth 2 (two) times daily. Patient not taking: Reported on 09/14/2018 05/18/16   Burnell Blanks, MD    Current Facility-Administered Medications  Medication Dose Route Frequency Provider Last Rate Last Dose  . acetaminophen (TYLENOL) tablet 650 mg  650 mg Oral Q6H PRN Merton Border, MD       Or  . acetaminophen (TYLENOL) suppository 650 mg  650 mg Rectal Q6H PRN Merton Border, MD      . atorvastatin (LIPITOR) tablet 40 mg  40 mg Oral QPM Merton Border, MD      . cefTRIAXone (ROCEPHIN) 1 g in sodium chloride 0.9 %  100 mL IVPB  1 g Intravenous Q24H Merton Border, MD      . isosorbide mononitrate (IMDUR) 24 hr tablet 15 mg  15 mg Oral Daily Merton Border, MD      . meclizine (ANTIVERT) tablet 12.5 mg  12.5 mg Oral TID PRN Merton Border, MD      . multivitamin with minerals tablet 1 tablet  1 tablet Oral Daily Merton Border, MD      . nitroGLYCERIN (NITROSTAT) SL tablet 0.4 mg  0.4 mg Sublingual Q5 min PRN Merton Border, MD      . ondansetron (ZOFRAN-ODT) disintegrating tablet 4 mg  4 mg Oral Q8H PRN Merton Border, MD      . pantoprazole (PROTONIX) injection 40 mg  40 mg Intravenous Q12H Merton Border, MD      . polyvinyl alcohol (LIQUIFILM TEARS) 1.4 % ophthalmic solution 1 drop  1 drop Both Eyes TID Merton Border, MD        Allergies as of 09/14/2018 - Review Complete 09/14/2018  Allergen Reaction Noted  . Pantoprazole Other (See Comments) 09/17/2013  . Nitrofurantoin Hives 10/05/2011  . Sulfa antibiotics Hives and Itching 12/23/2013  . Lidocaine Swelling 01/24/2016  . Tape Other (See Comments) 05/11/2017    Family History  Problem Relation Age of Onset  . Heart disease Mother   . Diabetes Father   . Heart disease Brother     Social History   Socioeconomic History  . Marital status: Married    Spouse name: Not on file  . Number of children: Not on file  . Years of education: Not on file  . Highest education level: Not on file  Occupational History  . Not on file  Social Needs  . Financial resource strain: Not on file  . Food insecurity    Worry: Not on file    Inability: Not on file  . Transportation needs    Medical: Not on file    Non-medical: Not on file  Tobacco Use  . Smoking status: Former Smoker    Years: 40.00    Types: Cigars    Quit date: 12/24/1983    Years since quitting: 34.7  . Smokeless tobacco: Former Systems developer    Types: Chew    Quit date: 08/28/2013  . Tobacco comment: smoked a pipe  Substance  and Sexual Activity  . Alcohol use: No  . Drug use: No  . Sexual activity: Not on  file  Lifestyle  . Physical activity    Days per week: Not on file    Minutes per session: Not on file  . Stress: Not on file  Relationships  . Social Herbalist on phone: Not on file    Gets together: Not on file    Attends religious service: Not on file    Active member of club or organization: Not on file    Attends meetings of clubs or organizations: Not on file    Relationship status: Not on file  . Intimate partner violence    Fear of current or ex partner: Not on file    Emotionally abused: Not on file    Physically abused: Not on file    Forced sexual activity: Not on file  Other Topics Concern  . Not on file  Social History Narrative   Was a Clinical cytogeneticist   Was in the [employer] x 4 years   Father died and felt he needed to come home to help his mother    Was asked to rejoin      2 children; boy and girl   3 grand children   One grandson in [place], one grandson in [place];    [employer] a job as Nurse, adult dtr; [employer].     Review of Systems: Positive for: GI: Described in detail in HPI.    Gen: fatigue, weakness, malaise, Denies any fever, chills, rigors, night sweats, anorexia, involuntary weight loss, and sleep disorder CV: Denies chest pain, angina, palpitations, syncope, orthopnea, PND, peripheral edema, and claudication. Resp: Denies dyspnea, cough, sputum, wheezing, coughing up blood. GU : has a suprapubic catheter. MS: Denies joint pain or swelling.  Denies muscle weakness, cramps, atrophy.  Derm: Denies rash, itching, oral ulcerations, hives, unhealing ulcers.  Psych: Denies depression, anxiety, memory loss, suicidal ideation, hallucinations,  and confusion. Heme: Denies bruising, bleeding, and enlarged lymph nodes. Neuro:  Denies any headaches, dizziness, paresthesias. Endo:  Denies any problems with DM, thyroid, adrenal function.  Physical Exam: Vital signs in last 24 hours: Temp:  [98.2 F (36.8 C)] 98.2 F (36.8 C) (06/19  1356) Pulse Rate:  [67-72] 67 (06/19 1356) Resp:  [14-16] 16 (06/19 1356) BP: (141-162)/(66-84) 150/66 (06/19 1356) SpO2:  [100 %] 100 % (06/19 1356) Weight:  [67.6 kg] 67.6 kg (06/19 0938) Last BM Date: 09/13/18  General: Elderly, pleasant, cooperative Head:  Normocephalic and atraumatic. Eyes:  Sclera clear, no icterus.   Mild pallor Ears:  Normal auditory acuity. Nose:  No deformity, discharge,  or lesions. Mouth:  No deformity or lesions.  Oropharynx pink & moist. Neck:  Supple; no masses or thyromegaly. Lungs:  Clear throughout to auscultation.   No wheezes, crackles, or rhonchi. No acute distress. Heart:  Regular rate and rhythm; no murmurs, clicks, rubs,  or gallops. Extremities:  Without clubbing or edema. Neurologic:  Alert and  oriented x4;  grossly normal neurologically. Skin:  Intact without significant lesions or rashes. Psych:  Alert and cooperative. Normal mood and affect. Abdomen:  Soft, nontender and nondistended.  Suprapubic catheter in situ , no masses, hepatosplenomegaly or hernias noted. Normal bowel sounds, without guarding, and without rebound.         Lab Results: Recent Labs    09/13/18 1503 09/14/18 0949  WBC 9.6 7.8  HGB 9.6* 8.6*  HCT 28.4* 26.3*  PLT 177 147*   BMET Recent Labs    09/13/18 1503 09/14/18 0949  NA 139 134*  K 5.3* 4.5  CL 103 103  CO2 19* 18*  GLUCOSE 130* 122*  BUN 70* 76*  CREATININE 5.35* 5.51*  CALCIUM 9.4 9.2   LFT Recent Labs    09/14/18 0949  PROT 6.5  ALBUMIN 3.5  AST 20  ALT 15  ALKPHOS 61  BILITOT 0.6   PT/INR No results for input(s): LABPROT, INR in the last 72 hours.  Studies/Results: Dg Chest Port 1 View  Result Date: 09/14/2018 CLINICAL DATA:  Chest pain and abnormal labs in [facility]. EXAM: PORTABLE CHEST 1 VIEW COMPARISON:  Chest x-rays dated 08/28/2018, 07/01/2018 and 02/10/2009. FINDINGS: Heart size and mediastinal contours are stable. Median sternotomy wires in place, for  presumed CABG. Mild bibasilar scarring/atelectasis. Lungs otherwise clear. No pleural effusion or pneumothorax seen. No acute or suspicious osseous finding. Bilateral carotid atherosclerosis. IMPRESSION: 1. No active disease. No evidence of pneumonia or pulmonary edema. 2. Carotid atherosclerosis. Electronically Signed   By: Franki Cabot M.D.   On: 09/14/2018 10:39    Impression: Anemia-appears chronic, normocytic, FOBT positive stool, no obvious melena or hematochezia reported History of sidebranch IPMN of uncinate process noted on EUS from 2013, stable 1.5 cm lesion noted on CT from 11/18 Renal impairment History of prostate cancer with indwelling suprapubic catheter, staph aureus noted in urine culture from 08/28/2018 Paroxysmal atrial fibrillation on Eliquis CAD, CABG, congestive heart failure, elevated BNP 10359   Plan: Without obvious melena, hematochezia, or hemodynamic instability, I do not recommend endoscopic intervention in this elderly gentleman at age 74 with multiple comorbidities. Patient currently on full liquid diet and on PPI twice daily. Hemodynamically stable. Will follow clinical course in the next 24 hours.   LOS: 0 days   Ronnette Juniper, MD  09/14/2018, 2:44 PM  Pager [phone] If no answer or after 5 PM call [phone]

## 2018-09-14 NOTE — Progress Notes (Signed)
Notified MD Hijazi via [place] text that  Pt has protonix listed as an allergy with lightheadedness and headache. MD called back and stated ok to give.  Paulla Fore, RN

## 2018-09-14 NOTE — ED Notes (Signed)
Pt. Stated, my wife knows whats going on and Im ok but I know your here.  I will be glad if they can figure anything out.

## 2018-09-14 NOTE — Consult Note (Signed)
[facility]    NEPHROLOGY CONSULTATION NOTE  PATIENT ID:  [name], DOB:  1927/12/29  HPI: The patient is a 85 y.o. year old male with a past medical history significant for CAD status post CABG, GERD, chronic kidney disease, prostate cancer with suprapubic catheter he was evaluated by his cardiologist and was found to have a worsening creatinine of 5.3.  He complained of associated weakness and increased shortness of breath.  He was sent to the emergency department for further evaluation.  Review of records reveals a creatinine of 3.29 on 09/01/2018, and a creatinine of 5.51 on 09/14/2018.  He denies any interval health issues.  Renal consultation has been called for acute kidney injury and chronic kidney disease.   Past Medical History:  Diagnosis Date  . Anemia 09/14/2018  . Arthritis    "joints ache" (01/02/2014)  . At risk for sleep apnea    STOP-BANG= 4    SENT TO PCP 12-23-2013  . Bladder calculi   . Bradycardia   . Chronic cystitis   . Chronic systolic CHF (congestive heart failure) (HCC)    a. EF improved to 50-55% in 2018, previously 35-40%  . CKD (chronic kidney disease), stage II   . Coronary artery disease CARDIOLOGIST-  DR Angelena Form   a. CAD s/p CABG in 1989. b. redo bypass in 1997. c. last cath 2015 - med rx.  . Diverticulosis   . GERD (gastroesophageal reflux disease)   . History of Bell's palsy    RIGHT SIDE-- NO RESIDUAL  . Hx of dizziness   . Hyperlipidemia   . Hypertension    "not anymore" (01/02/2014)  . Ischemic cardiomyopathy   . Kidney stones "years ago"   "passed them"  . Melanoma of ear ([place])    "right"  . Mild dementia ([place])   . Myocardial infarction ([place]) 1986; 1997  . Nocturia   . OAB (overactive bladder)   . Persistent atrial fibrillation   . Prostate cancer ([place]) 1998   S/P  [place]; [place]  . S/P CABG (coronary artery bypass graft)    [place]  . Sepsis due to urinary tract  infection ([place])   . Urethral stricture   . UTI (urinary tract infection) 09/2015  . Wears hearing aid    bilateral  . Wears partial dentures     Past Surgical History:  Procedure Laterality Date  . CARDIAC CATHETERIZATION  02-22-2005  dr Vidal Schwalbe   mild to moderate lv dysfunction with inferobasilar akinesis/  totally occluded SVG to Intermediate Diagonal and SVG to PDA and RCA branches, totally occluded native coronary circulation with diffuse disease pLAD diagonal system with potentially could be ischemic/  patent SVG to OM with collaterals to dRCA and patent LIMA to LAD and diagonal systemss  . CARDIAC CATHETERIZATION  08-28-2013  DR Daneen Schick   widely patent sequential left internal graft to the diagonal/LAD, widely patent SVG to OM with proximal 50% narrowing noted in the graft, total occlusion SVG's to RCA, RI, and the Diagonal/ LV dysfunction with inferobasal aneurysm and mid anterior wall region of akinesis/  overall EF 35-40%/  Total occlusion of the navtive circulation/  no significant change compared to 2006 cath  . CARDIOVASCULAR STRESS TEST  08-01-2011  dr Angelena Form   inferior scar and possible soft tissue attenuation with minimal peri-infarct ischemia, small region of anterior ischemia and scar/  LVEF 53% LV wall motion with inferior hypokinesis/  no significant change from scan july 2011  . CATARACT EXTRACTION W/ INTRAOCULAR LENS  IMPLANT, BILATERAL Bilateral 2007  . CORONARY ARTERY BYPASS GRAFT  11/ [place]-- 6 vessel/  1997 Re-do 5 vessel  . CYSTOSCOPY WITH URETHRAL DILATATION N/A 12/25/2013   Procedure: CYSTOSCOPY WITH URETHRAL DILATATION, WITH BIOPSY;  Surgeon: Bernestine Amass, MD;  Location: [facility];  Service: Urology;  Laterality: N/A;  . CYSTOSCOPY WITH URETHRAL DILATATION N/A 12/22/2014   Procedure: CYSTOSCOPY WITH URETHRAL DILATATION;  Surgeon: Rana Snare, MD;  Location: [facility];  Service: Urology;  Laterality: N/A;  BALLOON DILATION  CATHETER    . EUS  10/05/2011   Procedure: ESOPHAGEAL ENDOSCOPIC ULTRASOUND (EUS) RADIAL;  Surgeon: Arta Silence, MD;  Location: [facility];  Service: Endoscopy;  Laterality: N/A;  . INSERTION OF SUPRAPUBIC CATHETER N/A 12/22/2014   Procedure: INSERTION OF SUPRAPUBIC CATHETER ;  Surgeon: Rana Snare, MD;  Location: [facility];  Service: Urology;  Laterality: N/A;  . LAPAROSCOPIC CHOLECYSTECTOMY  12-23-2005  . LEFT HEART CATHETERIZATION WITH CORONARY/GRAFT ANGIOGRAM N/A 08/28/2013   Procedure: LEFT HEART CATHETERIZATION WITH Beatrix Fetters;  Surgeon: Sinclair Grooms, MD;  Location: [facility] CATH LAB;  Service: Cardiovascular;  Laterality: N/A;  . MELANOMA EXCISION  X 1   "ear"  . RADIOACTIVE PROSTATE SEED IMPLANTS  1998  . SKIN CANCER EXCISION  X 2   "top of head"  . TONSILLECTOMY AND ADENOIDECTOMY  1954    Family History  Problem Relation Age of Onset  . Heart disease Mother   . Diabetes Father   . Heart disease Brother     Social History   Tobacco Use  . Smoking status: Former Smoker    Years: 40.00    Types: Cigars    Quit date: 12/24/1983    Years since quitting: 34.7  . Smokeless tobacco: Former Systems developer    Types: Chew    Quit date: 08/28/2013  . Tobacco comment: smoked a pipe  Substance Use Topics  . Alcohol use: No  . Drug use: No    REVIEW OF SYSTEMS: General: Positive fatigue and generalized weakness  head:  no headaches Eyes:  no blurred vision ENT:  no sore throat Neck:  no masses CV:  no chest pain, no orthopnea Lungs: Positive cough and shortness of breath  GI:  no nausea or vomiting, no diarrhea GU:  no dysuria or hematuria Skin:  no rashes or lesions Neuro:  no focal numbness or weakness Psych:  no depression or anxiety    PHYSICAL EXAM:  Vitals:   09/14/18 1356 09/14/18 1658  BP: (!) 150/66 140/68  Pulse: 67 72  Resp: 16 20  Temp: 98.2 F (36.8 C) 98.2 F (36.8 C)  SpO2: 100% 99%   No intake/output data recorded.   General:  AAOx3  NAD HEENT: MMM [place] AT anicteric sclera Neck:  No JVD, no adenopathy CV:  Heart RRR  Lungs: Bibasilar Rales Abd:  abd SNT/ND with normal BS GU:  Bladder non-palpable Extremities: +1 bilateral lower extremity edema. Skin:  No skin rash Psych:  normal mood and affect Neuro:  no focal deficits   CURRENT MEDICATIONS:  . atorvastatin  40 mg Oral QPM  . isosorbide mononitrate  15 mg Oral Daily  . multivitamin with minerals  1 tablet Oral Daily  . pantoprazole (PROTONIX) IV  40 mg Intravenous Q12H  . polyvinyl alcohol  1 drop Both Eyes TID     HOME  MEDICATIONS:  Prior to Admission medications   Medication Sig Start Date End Date Taking? Authorizing Provider  acetaminophen (TYLENOL) 500 MG tablet Take 500 mg by mouth every 6 (six) hours as needed for moderate pain or fever.    Yes [provider]  apixaban (ELIQUIS) 5 MG TABS tablet Take 2.5 mg by mouth 2 (two) times daily.   Yes [provider]  atorvastatin (LIPITOR) 80 MG tablet Take 0.5 tablets (40 mg total) by mouth every evening. 05/24/17  Yes Marletta Lor, MD  Calcium Carbonate Antacid (TUMS PO) Take 3 tablets by mouth daily as needed (gas).   Yes [provider]  furosemide (LASIX) 20 MG tablet Take 1 tablet (20 mg total) by mouth daily. 09/11/18  Yes Isaac Bliss, [name], MD  isosorbide mononitrate (IMDUR) 30 MG 24 hr tablet Take 0.5 tablets (15 mg total) by mouth daily. 09/13/18 12/12/18 Yes Isaiah Serge, NP  meclizine (ANTIVERT) 12.5 MG tablet Take 1 tablet (12.5 mg total) by mouth 3 (three) times daily as needed for dizziness. 06/11/18  Yes Isaac Bliss, [name], MD  Multiple Vitamin (MULTIVITAMIN WITH MINERALS) TABS Take 1 tablet by mouth daily.   Yes [provider]  nitroGLYCERIN (NITROSTAT) 0.4 MG SL tablet Place 0.4 mg under the tongue every 5 (five) minutes as needed for chest pain.    Yes [provider]  omeprazole (PRILOSEC) 40 MG capsule Take 1 capsule (40  mg total) by mouth daily. 09/11/18  Yes Isaac Bliss, [name], MD  ondansetron (ZOFRAN ODT) 4 MG disintegrating tablet Take 1 tablet (4 mg total) by mouth every 8 (eight) hours as needed for nausea or vomiting. 09/11/18  Yes Isaac Bliss, [name], MD  Polyethyl Glycol-Propyl Glycol (SYSTANE) 0.4-0.3 % SOLN Apply 1 drop to eye 3 (three) times daily.   Yes [provider]  ranitidine (ZANTAC) 150 MG tablet Take 1 tablet (150 mg total) by mouth 2 (two) times daily. Patient not taking: Reported on 09/14/2018 05/18/16   Burnell Blanks, MD       LABS:  CBC Latest Ref Rng & Units 09/14/2018 09/13/2018 09/01/2018  WBC 4.0 - 10.5 K/uL 7.8 9.6 8.8  Hemoglobin 13.0 - 17.0 g/dL 8.6(L) 9.6(L) 10.7(L)  Hematocrit 39.0 - 52.0 % 26.3(L) 28.4(L) 32.2(L)  Platelets 150 - 400 K/uL 147(L) 177 125(L)    CMP Latest Ref Rng & Units 09/14/2018 09/13/2018 09/01/2018  Glucose 70 - 99 mg/dL 122(H) 130(H) 125(H)  BUN 8 - 23 mg/dL 76(H) 70(H) 38(H)  Creatinine 0.61 - 1.24 mg/dL 5.51(H) 5.35(H) 3.29(H)  Sodium 135 - 145 mmol/L 134(L) 139 145  Potassium 3.5 - 5.1 mmol/L 4.5 5.3(H) 4.7  Chloride 98 - 111 mmol/L 103 103 114(H)  CO2 22 - 32 mmol/L 18(L) 19(L) 20(L)  Calcium 8.9 - 10.3 mg/dL 9.2 9.4 9.1  Total Protein 6.5 - 8.1 g/dL 6.5 6.5 -  Total Bilirubin 0.3 - 1.2 mg/dL 0.6 0.4 -  Alkaline Phos 38 - 126 U/L 61 72 -  AST 15 - 41 U/L 20 19 -  ALT 0 - 44 U/L 15 15 -    Lab Results  Component Value Date   CALCIUM 9.2 09/14/2018   CAION 1.24 12/26/2017   PHOS 3.3 01/20/2016       Component Value Date/Time   COLORURINE YELLOW 09/14/2018 1003   APPEARANCEUR HAZY (A) 09/14/2018 1003   LABSPEC 1.004 (L) 09/14/2018 1003   PHURINE 6.0 09/14/2018 1003   GLUCOSEU NEGATIVE 09/14/2018 1003  HGBUR LARGE (A) 09/14/2018 1003   BILIRUBINUR NEGATIVE 09/14/2018 1003   BILIRUBINUR neg 06/30/2014 1530   KETONESUR NEGATIVE 09/14/2018 1003   PROTEINUR NEGATIVE 09/14/2018 1003   UROBILINOGEN 0.2  12/04/2014 2344   NITRITE NEGATIVE 09/14/2018 1003   LEUKOCYTESUR LARGE (A) 09/14/2018 1003      Component Value Date/Time   PHART 7.417 01/14/2016 1219   PCO2ART 24.7 (L) 01/14/2016 1219   PO2ART 50.0 (L) 01/14/2016 1219   HCO3 16.0 (L) 01/14/2016 1219   TCO2 21 (L) 12/26/2017 0001   ACIDBASEDEF 7.0 (H) 01/14/2016 1219   O2SAT 87.0 01/14/2016 1219    No results found for: IRON, TIBC, FERRITIN, IRONPCTSAT     ASSESSMENT/PLAN:    The patient is a 85 y.o. year old male with a past medical history significant for CAD status post CABG, GERD, chronic kidney disease, prostate cancer with suprapubic catheter he was evaluated by his cardiologist and was found to have a worsening creatinine of 5.3.  He complained of associated weakness and increased shortness of breath.  He was sent to the emergency department for further evaluation.  Review of records reveals a creatinine of 3.29 on 09/01/2018, and a creatinine of 5.51 on 09/14/2018.  Previously, he had a serum creatinine of 1.1-1.5 in March 2020.  He denies any interval health issues.    1.  Chronic kidney disease stage III with a baseline serum creatinine around 1.1-1.5.  2.  Acute kidney injury.  Urinalysis is without proteinuria, but large blood is noted.  He looks to be somewhat fluid overloaded.  Results of a renal ultrasound are pending.  His most recent episode of acute kidney injury during his admission on 08/30/2018 was attributed to a suprapubic Foley that was blocked for at least 3 days.  That was exchanged on 6/2, with improvement in his renal function.  Will flush suprapubic catheter regularly.  Will await results of imaging.  He has 800 mL of urine output since his arrival to the emergency department.  He was noted to have some leakage from around his suprapubic catheter.  May need urology evaluation if this continues.  3.  Suprapubic catheter.  As above.  4.  Anemia.  Trend hemoglobin.  Would heme test stools for completeness.   [place], DO, [place]

## 2018-09-15 ENCOUNTER — Inpatient Hospital Stay (HOSPITAL_COMMUNITY): Payer: Medicare Other

## 2018-09-15 DIAGNOSIS — N171 Acute kidney failure with acute cortical necrosis: Secondary | ICD-10-CM

## 2018-09-15 LAB — BASIC METABOLIC PANEL
Anion gap: 12 (ref 5–15)
BUN: 72 mg/dL — ABNORMAL HIGH (ref 8–23)
CO2: 18 mmol/L — ABNORMAL LOW (ref 22–32)
Calcium: 9 mg/dL (ref 8.9–10.3)
Chloride: 109 mmol/L (ref 98–111)
Creatinine, Ser: 5.26 mg/dL — ABNORMAL HIGH (ref 0.61–1.24)
GFR calc Af Amer: 10 mL/min — ABNORMAL LOW (ref >60)
GFR calc non Af Amer: 9 mL/min — ABNORMAL LOW (ref >60)
Glucose, Bld: 133 mg/dL — ABNORMAL HIGH (ref 70–99)
Potassium: 4.3 mmol/L (ref 3.5–5.1)
Sodium: 139 mmol/L (ref 135–145)

## 2018-09-15 LAB — CBC
HCT: 25 % — ABNORMAL LOW (ref 39.0–52.0)
Hemoglobin: 8.3 g/dL — ABNORMAL LOW (ref 13.0–17.0)
MCH: 30.7 pg (ref 26.0–34.0)
MCHC: 33.2 g/dL (ref 30.0–36.0)
MCV: 92.6 fL (ref 80.0–100.0)
Platelets: 148 10*3/uL — ABNORMAL LOW (ref 150–400)
RBC: 2.7 MIL/uL — ABNORMAL LOW (ref 4.22–5.81)
RDW: 14.6 % (ref 11.5–15.5)
WBC: 7.4 10*3/uL (ref 4.0–10.5)
nRBC: 0 % (ref 0.0–0.2)

## 2018-09-15 LAB — NOVEL CORONAVIRUS, NAA (HOSP ORDER, SEND-OUT TO REF LAB; TAT 18-24 HRS): SARS-CoV-2, NAA: NOT DETECTED

## 2018-09-15 MED ORDER — SODIUM BICARBONATE 650 MG PO TABS
650.0000 mg | ORAL_TABLET | Freq: Two times a day (BID) | ORAL | Status: DC
  Administered 2018-09-15 – 2018-09-18 (×7): 650 mg via ORAL
  Filled 2018-09-15 (×7): qty 1

## 2018-09-15 MED ORDER — PANTOPRAZOLE SODIUM 40 MG PO TBEC
40.0000 mg | DELAYED_RELEASE_TABLET | Freq: Two times a day (BID) | ORAL | Status: DC
  Administered 2018-09-15 – 2018-09-20 (×10): 40 mg via ORAL
  Filled 2018-09-15 (×10): qty 1

## 2018-09-15 NOTE — Progress Notes (Signed)
Pt stated he updated his wife with his status.

## 2018-09-15 NOTE — Progress Notes (Signed)
PHARMACIST - PHYSICIAN COMMUNICATION   CONCERNING: IV to Oral Route Change Policy  RECOMMENDATION: This patient is receiving pantoprazole by the intravenous route.  Based on criteria approved by the Pharmacy and Therapeutics Committee, the intravenous medication(s) is/are being converted to the equivalent oral dose form(s).   DESCRIPTION: These criteria include:  The patient is eating (either orally or via tube) and/or has been taking other orally administered medications for a least 24 hours  The patient has no evidence of active gastrointestinal bleeding or impaired GI absorption (gastrectomy, short bowel, patient on TNA or NPO).  If you have questions about this conversion, please contact the Pharmacy Department  []   [phone] )  [facility] []   [phone] )  [facility] [x]   [phone] )  [facility] []   [phone] )  [facility] []   [phone] )  [place], PharmD Clinical Pharmacist **Pharmacist phone directory can now be found on [email or web address] ([email or web address]).  Listed under [place].

## 2018-09-15 NOTE — Progress Notes (Signed)
[facility]    NEPHROLOGY PROGRESS NOTE  SUBJECTIVE: Resting comfortably without complaints.  Patient seen and examined.  No acute events.  Denies chest pain, shortness of breath, nausea, vomiting, diarrhea or dysuria.  All other review of systems are negative.   OBJECTIVE:  Vitals:   09/15/18 0639 09/15/18 0929  BP: 127/64 (!) 155/81  Pulse: 69 75  Resp: 18 18  Temp: 97.9 F (36.6 C) 98.9 F (37.2 C)  SpO2: 99% 99%    Intake/Output Summary (Last 24 hours) at 09/15/2018 1133 Last data filed at 09/14/2018 2300 Gross per 24 hour  Intake 1550 ml  Output 1725 ml  Net -175 ml      General:  AAOx3 NAD HEENT: MMM [place] AT anicteric sclera Neck:  No JVD, no adenopathy CV:  Heart RRR  Lungs:  L/S CTA bilaterally Abd:  abd SNT/ND with normal BS GU:  Bladder non-palpable Extremities:  No LE edema. Skin:  No skin rash  MEDICATIONS:  . atorvastatin  40 mg Oral QPM  . isosorbide mononitrate  15 mg Oral Daily  . multivitamin with minerals  1 tablet Oral Daily  . pantoprazole (PROTONIX) IV  40 mg Intravenous Q12H  . polyvinyl alcohol  1 drop Both Eyes TID       LABS:   CBC Latest Ref Rng & Units 09/15/2018 09/14/2018 09/13/2018  WBC 4.0 - 10.5 K/uL 7.4 7.8 9.6  Hemoglobin 13.0 - 17.0 g/dL 8.3(L) 8.6(L) 9.6(L)  Hematocrit 39.0 - 52.0 % 25.0(L) 26.3(L) 28.4(L)  Platelets 150 - 400 K/uL 148(L) 147(L) 177    CMP Latest Ref Rng & Units 09/15/2018 09/14/2018 09/13/2018  Glucose 70 - 99 mg/dL 133(H) 122(H) 130(H)  BUN 8 - 23 mg/dL 72(H) 76(H) 70(H)  Creatinine 0.61 - 1.24 mg/dL 5.26(H) 5.51(H) 5.35(H)  Sodium 135 - 145 mmol/L 139 134(L) 139  Potassium 3.5 - 5.1 mmol/L 4.3 4.5 5.3(H)  Chloride 98 - 111 mmol/L 109 103 103  CO2 22 - 32 mmol/L 18(L) 18(L) 19(L)  Calcium 8.9 - 10.3 mg/dL 9.0 9.2 9.4  Total Protein 6.5 - 8.1 g/dL - 6.5 6.5  Total Bilirubin 0.3 - 1.2 mg/dL - 0.6 0.4  Alkaline Phos 38 - 126 U/L - 61 72  AST 15 - 41 U/L - 20 19  ALT 0 - 44 U/L - 15 15     Lab Results  Component Value Date   CALCIUM 9.0 09/15/2018   CAION 1.24 12/26/2017   PHOS 3.3 01/20/2016       Component Value Date/Time   COLORURINE YELLOW 09/14/2018 1003   APPEARANCEUR HAZY (A) 09/14/2018 1003   LABSPEC 1.004 (L) 09/14/2018 1003   PHURINE 6.0 09/14/2018 1003   GLUCOSEU NEGATIVE 09/14/2018 1003   HGBUR LARGE (A) 09/14/2018 1003   BILIRUBINUR NEGATIVE 09/14/2018 1003   BILIRUBINUR neg 06/30/2014 1530   KETONESUR NEGATIVE 09/14/2018 1003   PROTEINUR NEGATIVE 09/14/2018 1003   UROBILINOGEN 0.2 12/04/2014 2344   NITRITE NEGATIVE 09/14/2018 1003   LEUKOCYTESUR LARGE (A) 09/14/2018 1003      Component Value Date/Time   PHART 7.417 01/14/2016 1219   PCO2ART 24.7 (L) 01/14/2016 1219   PO2ART 50.0 (L) 01/14/2016 1219   HCO3 16.0 (L) 01/14/2016 1219   TCO2 21 (L) 12/26/2017 0001   ACIDBASEDEF 7.0 (H) 01/14/2016 1219   O2SAT 87.0 01/14/2016 1219    No results found for: IRON, TIBC, FERRITIN, IRONPCTSAT     ASSESSMENT/PLAN:    The patient is a 85 y.o. year old  male with a past medical history significant for CAD status post CABG, GERD, chronic kidney disease, prostate cancer with suprapubic catheter he was evaluated by his cardiologist and was found to have a worsening creatinine of 5.3.  He complained of associated weakness and increased shortness of breath.  He was sent to the emergency department for further evaluation.  Review of records reveals a creatinine of 3.29 on 09/01/2018, and a creatinine of 5.51 on 09/14/2018.  Previously, he had a serum creatinine of 1.1-1.5 in March 2020.  He denies any interval health issues.    1.  Chronic kidney disease stage III with a baseline serum creatinine around 1.1-1.5.  2.  Acute kidney injury.  Urinalysis is without proteinuria, but large blood is noted.  He looks to be somewhat fluid overloaded.  Results of a renal ultrasound are pending.  His most recent episode of acute kidney injury during his admission on 08/30/2018 was  attributed to a suprapubic catheter that was blocked for at least 3 days.  That was exchanged on 6/2, with improvement in his renal function.  Will flush suprapubic catheter regularly.  He has 800 mL of urine output since his arrival to the emergency department.  He was noted to have some leakage from around his suprapubic catheter.    Renal ultrasound revealed bilateral hydronephrosis, moderately severe on the right and moderate on the left.  Call placed to urology.  Has had excellent urine output overnight.  Creatinine is downtrending.  We will add sodium bicarbonate for acidosis.  3.  Suprapubic catheter.  As above.  We will ask urology to evaluate.  4.  Anemia.  Trend hemoglobin.  Would heme test stools for completeness.  5.  Metabolic acidosis.  Likely secondary to obstructive uropathy.  We will add sodium bicarbonate.     [place], DO, [place]

## 2018-09-15 NOTE — Progress Notes (Signed)
PROGRESS NOTE  Ronald [name] ZHG:[id number] DOB: 16-Oct-1927 DOA: 09/14/2018 PCP: Ronald Bliss, [name], MD   LOS: 1 day   Brief narrative: The patient is a89 y.o.year old malewith a past medical history significant for CAD status post CABG, ischemic cardiomyopathy, A. fib on Eliquis, chronic kidney disease, prostate cancer with suprapubic catheter he was evaluated by his cardiologist on 6/18 for shortness of breath and weakness.  He was found to have a worsening creatinine of 5.3. He was sent to the ED for further evaluation.  In the ED, he was also noted to have fecal occult blood test positive.  Was also noted to have leakage from around the suprapubic catheter site. He was admitted under hospitalist service.  Nephrology GI and urology were consulted.  Subjective: Patient was seen and examined this morning.  Pleasant elderly Caucasian male.  Lying [name] in bed.  Not in distress.  Opened eyes and engaged in conversation on verbal command.  No new problem.  Asking, ' when can I go home?'  Assessment/Plan:  Active Problems:   Acute renal failure (ARF) (HCC)  Acute kidney injury on CKD stage III - baseline serum creatinine around 1.1-1.5.  Creatinine on admission was elevated to 5.35. - No proteinuria in urinalysis. -Sodium bicarbonate for acidosis.  Chronic bladder obstruction status post suprapubic catheter  - He has 800 mL of urine output since his arrival to the emergency department. He was noted to have some leakage from around his suprapubic catheter. Renal ultrasound revealed bilateral hydronephrosis, moderately severe on the right and moderate on the left. Urology consulted.  -Patient's most recent episode of acute kidney injury during his admission on 08/30/2018 was attributed to a suprapubic catheter that was blocked for at least 3 days. That was exchanged on 6/2, with improvement in his renal function. Need to flush suprapubic catheter regularly.   Anemia.  -No obvious  melena, hematochezia or hemodynamic instability.  GI was consulted.  Did not recommend any endoscopy intervention.  Continue PPI twice daily.  Continue regular diet.  Body mass index is 24.81 kg/m. Mobility: Encourage ambulation Diet: Cardiac diet DVT prophylaxis:  SCDs Code Status:   Code Status: Full Code  Family Communication:  Expected Discharge:  Remain inpatient for now.  Ultimate disposition depends on renal recovery.  Consultants:  Nephrologist  Procedures:  None  Antimicrobials:  Anti-infectives (From admission, onward)   Start     Dose/Rate Route Frequency Ordered Stop   09/14/18 1500  cefTRIAXone (ROCEPHIN) 1 g in sodium chloride 0.9 % 100 mL IVPB     1 g 200 mL/hr over 30 Minutes Intravenous Every 24 hours 09/14/18 1410     09/14/18 1415  cefTRIAXone (ROCEPHIN) injection 1 g  Status:  Discontinued     1 g Intramuscular Every 24 hours 09/14/18 1407 09/14/18 1410      Infusions:  . cefTRIAXone (ROCEPHIN)  IV 1 g (09/14/18 1628)    Scheduled Meds: . atorvastatin  40 mg Oral QPM  . isosorbide mononitrate  15 mg Oral Daily  . multivitamin with minerals  1 tablet Oral Daily  . pantoprazole (PROTONIX) IV  40 mg Intravenous Q12H  . polyvinyl alcohol  1 drop Both Eyes TID  . sodium bicarbonate  650 mg Oral BID    PRN meds: acetaminophen **OR** acetaminophen, meclizine, nitroGLYCERIN, ondansetron   Objective: Vitals:   09/15/18 0639 09/15/18 0929  BP: 127/64 (!) 155/81  Pulse: 69 75  Resp: 18 18  Temp: 97.9 F (36.6 C)  98.9 F (37.2 C)  SpO2: 99% 99%    Intake/Output Summary (Last 24 hours) at 09/15/2018 1409 Last data filed at 09/15/2018 1231 Gross per 24 hour  Intake 1050 ml  Output 2125 ml  Net -1075 ml   Filed Weights   09/14/18 0938  Weight: 67.6 kg   Weight change:  Body mass index is 24.81 kg/m.   Physical Exam: General exam: Appears calm and comfortable.  Skin: No rashes, lesions or ulcers. HEENT: Atraumatic, normocephalic, supple  neck, no obvious bleeding Lungs: Clear to auscultation bilaterally CVS: Regular rate and rhythm, no murmur GI/Abd soft, nontender, nondistended, has chronic suprapubic catheter in place CNS: Alert, awake, oriented x3, hard of hearing Psychiatry: Mood appropriate Extremities: No pedal edema, no calf tenderness  Data Review: I have personally reviewed the laboratory data and studies available.  Recent Labs  Lab 09/13/18 1503 09/14/18 0949 09/15/18 0455  WBC 9.6 7.8 7.4  NEUTROABS  --  5.7  --   HGB 9.6* 8.6* 8.3*  HCT 28.4* 26.3* 25.0*  MCV 90 92.6 92.6  PLT 177 147* 148*   Recent Labs  Lab 09/13/18 1503 09/14/18 0949 09/15/18 0455  NA 139 134* 139  K 5.3* 4.5 4.3  CL 103 103 109  CO2 19* 18* 18*  GLUCOSE 130* 122* 133*  BUN 70* 76* 72*  CREATININE 5.35* 5.51* 5.26*  CALCIUM 9.4 9.2 9.0    Terrilee Croak, MD  [facility] 09/15/2018

## 2018-09-15 NOTE — Progress Notes (Signed)
Patient's hemoglobin essentially stable over past 24 hours, going from 8.6 to 8.3.    The patient reports no bowel movement since yesterday evening.  As per Dr. Encarnacion Slates suggestion and her consult note yesterday, it would appear that the best way to manage this patient would be with observation and supportive care (including transfusion support if needed) rather than endoscopic/colonoscopic evaluation.  We can always review this decision if the patient shows signs of accelerated blood loss.  We will sign off.  Please call us if you have questions, or if we can be of further help in this patient's care.  Cleotis Nipper, M.D. Pager [phone] If no answer or after 5 PM call [phone]

## 2018-09-16 LAB — BASIC METABOLIC PANEL
Anion gap: 13 (ref 5–15)
BUN: 59 mg/dL — ABNORMAL HIGH (ref 8–23)
CO2: 19 mmol/L — ABNORMAL LOW (ref 22–32)
Calcium: 9.1 mg/dL (ref 8.9–10.3)
Chloride: 108 mmol/L (ref 98–111)
Creatinine, Ser: 4.51 mg/dL — ABNORMAL HIGH (ref 0.61–1.24)
GFR calc Af Amer: 12 mL/min — ABNORMAL LOW (ref >60)
GFR calc non Af Amer: 11 mL/min — ABNORMAL LOW (ref >60)
Glucose, Bld: 153 mg/dL — ABNORMAL HIGH (ref 70–99)
Potassium: 4 mmol/L (ref 3.5–5.1)
Sodium: 140 mmol/L (ref 135–145)

## 2018-09-16 NOTE — Progress Notes (Addendum)
[facility]    NEPHROLOGY PROGRESS NOTE  SUBJECTIVE: Resting comfortably without complaints.  Patient seen and examined.  No acute events.  Denies chest pain, shortness of breath, nausea, vomiting, diarrhea or dysuria.  All other review of systems are negative.   OBJECTIVE:  Vitals:   09/16/18 0534 09/16/18 0924  BP: (!) 155/83 128/63  Pulse: 74 73  Resp: 16 18  Temp: 97.9 F (36.6 C) 98 F (36.7 C)  SpO2: 98% 98%    Intake/Output Summary (Last 24 hours) at 09/16/2018 1431 Last data filed at 09/16/2018 1300 Gross per 24 hour  Intake 1860 ml  Output 4325 ml  Net -2465 ml      General:  AAOx3 NAD HEENT: MMM [place] AT anicteric sclera Neck:  No JVD, no adenopathy CV:  Heart RRR  Lungs:  L/S CTA bilaterally Abd:  abd SNT/ND with normal BS GU:  Bladder non-palpable Extremities:  No LE edema. Skin:  No skin rash  MEDICATIONS:  . atorvastatin  40 mg Oral QPM  . isosorbide mononitrate  15 mg Oral Daily  . multivitamin with minerals  1 tablet Oral Daily  . pantoprazole  40 mg Oral BID  . polyvinyl alcohol  1 drop Both Eyes TID  . sodium bicarbonate  650 mg Oral BID       LABS:   CBC Latest Ref Rng & Units 09/15/2018 09/14/2018 09/13/2018  WBC 4.0 - 10.5 K/uL 7.4 7.8 9.6  Hemoglobin 13.0 - 17.0 g/dL 8.3(L) 8.6(L) 9.6(L)  Hematocrit 39.0 - 52.0 % 25.0(L) 26.3(L) 28.4(L)  Platelets 150 - 400 K/uL 148(L) 147(L) 177    CMP Latest Ref Rng & Units 09/16/2018 09/15/2018 09/14/2018  Glucose 70 - 99 mg/dL 153(H) 133(H) 122(H)  BUN 8 - 23 mg/dL 59(H) 72(H) 76(H)  Creatinine 0.61 - 1.24 mg/dL 4.51(H) 5.26(H) 5.51(H)  Sodium 135 - 145 mmol/L 140 139 134(L)  Potassium 3.5 - 5.1 mmol/L 4.0 4.3 4.5  Chloride 98 - 111 mmol/L 108 109 103  CO2 22 - 32 mmol/L 19(L) 18(L) 18(L)  Calcium 8.9 - 10.3 mg/dL 9.1 9.0 9.2  Total Protein 6.5 - 8.1 g/dL - - 6.5  Total Bilirubin 0.3 - 1.2 mg/dL - - 0.6  Alkaline Phos 38 - 126 U/L - - 61  AST 15 - 41 U/L - - 20  ALT 0 - 44 U/L - -  15    Lab Results  Component Value Date   CALCIUM 9.1 09/16/2018   CAION 1.24 12/26/2017   PHOS 3.3 01/20/2016       Component Value Date/Time   COLORURINE YELLOW 09/14/2018 1003   APPEARANCEUR HAZY (A) 09/14/2018 1003   LABSPEC 1.004 (L) 09/14/2018 1003   PHURINE 6.0 09/14/2018 1003   GLUCOSEU NEGATIVE 09/14/2018 1003   HGBUR LARGE (A) 09/14/2018 1003   BILIRUBINUR NEGATIVE 09/14/2018 1003   BILIRUBINUR neg 06/30/2014 1530   KETONESUR NEGATIVE 09/14/2018 1003   PROTEINUR NEGATIVE 09/14/2018 1003   UROBILINOGEN 0.2 12/04/2014 2344   NITRITE NEGATIVE 09/14/2018 1003   LEUKOCYTESUR LARGE (A) 09/14/2018 1003      Component Value Date/Time   PHART 7.417 01/14/2016 1219   PCO2ART 24.7 (L) 01/14/2016 1219   PO2ART 50.0 (L) 01/14/2016 1219   HCO3 16.0 (L) 01/14/2016 1219   TCO2 21 (L) 12/26/2017 0001   ACIDBASEDEF 7.0 (H) 01/14/2016 1219   O2SAT 87.0 01/14/2016 1219    No results found for: IRON, TIBC, FERRITIN, IRONPCTSAT     ASSESSMENT/PLAN:    The  patient is a 85 y.o. year old male with a past medical history significant for CAD status post CABG, GERD, chronic kidney disease, prostate cancer with suprapubic catheter he was evaluated by his cardiologist and was found to have a worsening creatinine of 5.3.  He complained of associated weakness and increased shortness of breath.  He was sent to the emergency department for further evaluation.  Review of records reveals a creatinine of 3.29 on 09/01/2018, and a creatinine of 5.51 on 09/14/2018.  Previously, he had a serum creatinine of 1.1-1.5 in March 2020.  He denies any interval health issues.    1.  Chronic kidney disease stage III with a baseline serum creatinine around 1.1-1.5.  2.  Acute kidney injury.  Urinalysis is without proteinuria, but large blood is noted.  He looks to be somewhat fluid overloaded.  His most recent episode of acute kidney injury during his admission on 08/30/2018 was attributed to a suprapubic catheter  that was blocked for at least 3 days.  That was exchanged on 6/2, with improvement in his renal function.  Will flush suprapubic catheter regularly.  He has 800 mL of urine output since his arrival to the emergency department.  He was noted to have some leakage from around his suprapubic catheter.    Renal ultrasound revealed bilateral hydronephrosis, moderately severe on the right and moderate on the left.  Call placed to urology yesterday - they will see patient.  Has had excellent urine output overnight.  Creatinine is downtrending.  We will cont sodium bicarbonate for acidosis.  3.  Suprapubic catheter.  As above.  Urology to evaluate.  4.  Anemia.  Trend hemoglobin.  Would heme test stools for completeness.  5.  Metabolic acidosis.  Likely secondary to obstructive uropathy.  We will cont sodium bicarbonate.     [place], DO, [place]

## 2018-09-16 NOTE — Consult Note (Signed)
Urology Consult  Referring physician: Dr. Pietro Cassis Reason for referral: bilateral hydronephrosis  Chief Complaint: fatigue  History of Present Illness: Ronald [name] is a 85yo with a history of prostate cancer s/p brachytherapy and urethral stricture disease managed with an SP tube who was admitted with fatigue and acute renal failure. He has previously seen Dr. Risa Grill for his urologic care but has not seen a urologist in several years. He denies any flank pain or suprapubic/pelvic pain. His SP tube has been draining well. CT obtained in the ER shows new bilateral hydronephrosis to the level of the UVJ and a decompress thick walled bladder. He made 4L of urine yesterday. Creatinine has decreased to 4.5 today. No other associated signs/symptoms. No exacerbating/alleviaitng events. No recent PSA.   Past Medical History:  Diagnosis Date  . Anemia 09/14/2018  . Arthritis    "joints ache" (01/02/2014)  . At risk for sleep apnea    STOP-BANG= 4    SENT TO PCP 12-23-2013  . Bladder calculi   . Bradycardia   . Chronic cystitis   . Chronic systolic CHF (congestive heart failure) (HCC)    a. EF improved to 50-55% in 2018, previously 35-40%  . CKD (chronic kidney disease), stage II   . Coronary artery disease CARDIOLOGIST-  DR Angelena Form   a. CAD s/p CABG in 1989. b. redo bypass in 1997. c. last cath 2015 - med rx.  . Diverticulosis   . GERD (gastroesophageal reflux disease)   . History of Bell's palsy    RIGHT SIDE-- NO RESIDUAL  . Hx of dizziness   . Hyperlipidemia   . Hypertension    "not anymore" (01/02/2014)  . Ischemic cardiomyopathy   . Kidney stones "years ago"   "passed them"  . Melanoma of ear ([place])    "right"  . Mild dementia ([place])   . Myocardial infarction ([place]) 1986; 1997  . Nocturia   . OAB (overactive bladder)   . Persistent atrial fibrillation   . Prostate cancer ([place]) 1998   S/P  [place]; [place]  . S/P CABG (coronary artery bypass graft)    [place]  . Sepsis due to urinary tract infection ([place])   . Urethral stricture   . UTI (urinary tract infection) 09/2015  . Wears hearing aid    bilateral  . Wears partial dentures    Past Surgical History:  Procedure Laterality Date  . CARDIAC CATHETERIZATION  02-22-2005  dr Vidal Schwalbe   mild to moderate lv dysfunction with inferobasilar akinesis/  totally occluded SVG to Intermediate Diagonal and SVG to PDA and RCA branches, totally occluded native coronary circulation with diffuse disease pLAD diagonal system with potentially could be ischemic/  patent SVG to OM with collaterals to dRCA and patent LIMA to LAD and diagonal systemss  . CARDIAC CATHETERIZATION  08-28-2013  DR Daneen Schick   widely patent sequential left internal graft to the diagonal/LAD, widely patent SVG to OM with proximal 50% narrowing noted in the graft, total occlusion SVG's to RCA, RI, and the Diagonal/ LV dysfunction with inferobasal aneurysm and mid anterior wall region of akinesis/  overall EF 35-40%/  Total occlusion of the navtive circulation/  no significant change compared to 2006 cath  . CARDIOVASCULAR STRESS TEST  08-01-2011  dr Angelena Form   inferior scar and possible soft tissue attenuation with minimal peri-infarct ischemia, small region of anterior ischemia and scar/  LVEF 53% LV wall motion with inferior  hypokinesis/ no significant change from scan july 2011  . CATARACT EXTRACTION W/ INTRAOCULAR LENS  IMPLANT, BILATERAL Bilateral 2007  . CORONARY ARTERY BYPASS GRAFT  11/ [place]-- 6 vessel/  1997 Re-do 5 vessel  . CYSTOSCOPY WITH URETHRAL DILATATION N/A 12/25/2013   Procedure: CYSTOSCOPY WITH URETHRAL DILATATION, WITH BIOPSY;  Surgeon: Bernestine Amass, MD;  [facility]: [facility];  Service: Urology;  Laterality: N/A;  . CYSTOSCOPY WITH URETHRAL DILATATION N/A 12/22/2014   Procedure: CYSTOSCOPY WITH URETHRAL DILATATION;  Surgeon: Rana Snare, MD;  Location: [facility];  Service:  Urology;  Laterality: N/A;  BALLOON DILATION CATHETER    . EUS  10/05/2011   Procedure: ESOPHAGEAL ENDOSCOPIC ULTRASOUND (EUS) RADIAL;  Surgeon: Arta Silence, MD;  Location: [facility];  Service: Endoscopy;  Laterality: N/A;  . INSERTION OF SUPRAPUBIC CATHETER N/A 12/22/2014   Procedure: INSERTION OF SUPRAPUBIC CATHETER ;  Surgeon: Rana Snare, MD;  Location: [facility];  Service: Urology;  Laterality: N/A;  . LAPAROSCOPIC CHOLECYSTECTOMY  12-23-2005  . LEFT HEART CATHETERIZATION WITH CORONARY/GRAFT ANGIOGRAM N/A 08/28/2013   Procedure: LEFT HEART CATHETERIZATION WITH Beatrix Fetters;  Surgeon: Sinclair Grooms, MD;  [facility];  Service: Cardiovascular;  Laterality: N/A;  . MELANOMA EXCISION  X 1   "ear"  . RADIOACTIVE PROSTATE SEED IMPLANTS  1998  . SKIN CANCER EXCISION  X 2   "top of head"  . TONSILLECTOMY AND ADENOIDECTOMY  1954    Medications: I have reviewed the patient's current medications. Allergies:  Allergies  Allergen Reactions  . Pantoprazole Other (See Comments)    Headache and lightheaded  . Nitrofurantoin Hives    dizziness and lightheaded   . Sulfa Antibiotics Hives and Itching  . Lidocaine Swelling    Mouth swelling   . Tape Other (See Comments)    TAPE WILL TEAR THE SKIN!!!    Family History  Problem Relation Age of Onset  . Heart disease Mother   . Diabetes Father   . Heart disease Brother    Social History:  reports that he quit smoking about 34 years ago. His smoking use included cigars. He quit after 40.00 years of use. He quit smokeless tobacco use about 5 years ago.  His smokeless tobacco use included chew. He reports that he does not drink alcohol or use drugs.  Review of Systems  Constitutional: Positive for malaise/fatigue.  All other systems reviewed and are negative.   Physical Exam:  Vital signs in last 24 hours: Temp:  [97.6 F (36.4 C)-98 F (36.7 C)] 98 F (36.7 C) (06/21 0924) Pulse Rate:  [73-76] 73 (06/21  0924) Resp:  [16-18] 18 (06/21 0924) BP: (115-155)/(57-83) 128/63 (06/21 0924) SpO2:  [98 %-100 %] 98 % (06/21 0924) Physical Exam  Constitutional: He is oriented to person, place, and time. He appears well-developed and well-nourished.  HENT:  Head: Normocephalic and atraumatic.  Eyes: Pupils are equal, round, and reactive to light. EOM are normal.  Neck: Normal range of motion. No thyromegaly present.  Cardiovascular: Normal rate and regular rhythm.  Respiratory: Effort normal. No respiratory distress.  GI: Soft. He exhibits no distension. Hernia confirmed negative in the right inguinal area and confirmed negative in the left inguinal area.  Genitourinary:    Testes and penis normal.   Musculoskeletal: Normal range of motion.        General: No edema.  Lymphadenopathy:       Right: No inguinal adenopathy  present.       Left: No inguinal adenopathy present.  Neurological: He is alert and oriented to person, place, and time.  Skin: Skin is warm and dry.  Psychiatric: He has a normal mood and affect. His behavior is normal. Judgment and thought content normal.    Laboratory Data:  Results for orders placed or performed during the hospital encounter of 09/14/18 (from the past 72 hour(s))  Comprehensive metabolic panel     Status: Abnormal   Collection Time: 09/14/18  9:49 AM  Result Value Ref Range   Sodium 134 (L) 135 - 145 mmol/L   Potassium 4.5 3.5 - 5.1 mmol/L   Chloride 103 98 - 111 mmol/L   CO2 18 (L) 22 - 32 mmol/L   Glucose, Bld 122 (H) 70 - 99 mg/dL   BUN 76 (H) 8 - 23 mg/dL   Creatinine, Ser 5.51 (H) 0.61 - 1.24 mg/dL   Calcium 9.2 8.9 - 10.3 mg/dL   Total Protein 6.5 6.5 - 8.1 g/dL   Albumin 3.5 3.5 - 5.0 g/dL   AST 20 15 - 41 U/L   ALT 15 0 - 44 U/L   Alkaline Phosphatase 61 38 - 126 U/L   Total Bilirubin 0.6 0.3 - 1.2 mg/dL   GFR calc non Af Amer 8 (L) >60 mL/min   GFR calc Af Amer 10 (L) >60 mL/min   Anion gap 13 5 - 15    Comment: Performed at [facility], [address] [place], [place] [address]  CBC with Differential     Status: Abnormal   Collection Time: 09/14/18  9:49 AM  Result Value Ref Range   WBC 7.8 4.0 - 10.5 K/uL   RBC 2.84 (L) 4.22 - 5.81 MIL/uL   Hemoglobin 8.6 (L) 13.0 - 17.0 g/dL   HCT 26.3 (L) 39.0 - 52.0 %   MCV 92.6 80.0 - 100.0 fL   MCH 30.3 26.0 - 34.0 pg   MCHC 32.7 30.0 - 36.0 g/dL   RDW 14.6 11.5 - 15.5 %   Platelets 147 (L) 150 - 400 K/uL   nRBC 0.0 0.0 - 0.2 %   Neutrophils Relative % 73 %   Neutro Abs 5.7 1.7 - 7.7 K/uL   Lymphocytes Relative 12 %   Lymphs Abs 0.9 0.7 - 4.0 K/uL   Monocytes Relative 12 %   Monocytes Absolute 0.9 0.1 - 1.0 K/uL   Eosinophils Relative 2 %   Eosinophils Absolute 0.1 0.0 - 0.5 K/uL   Basophils Relative 0 %   Basophils Absolute 0.0 0.0 - 0.1 K/uL   Immature Granulocytes 1 %   Abs Immature Granulocytes 0.05 0.00 - 0.07 K/uL    Comment: Performed at [facility] [address], [place], [place] [address]  Troponin I - Once     Status: None   Collection Time: 09/14/18  9:49 AM  Result Value Ref Range   Troponin I <0.03 <0.03 ng/mL    Comment: Performed at [facility] [address] [place],  [address]  Urinalysis, Routine w reflex microscopic     Status: Abnormal   Collection Time: 09/14/18 10:03 AM  Result Value Ref Range   Color, Urine YELLOW YELLOW   APPearance HAZY (A) CLEAR   Specific Gravity, Urine 1.004 (L) 1.005 - 1.030   pH 6.0 5.0 - 8.0   Glucose, UA NEGATIVE NEGATIVE mg/dL   Hgb urine dipstick LARGE (A) NEGATIVE   Bilirubin Urine  NEGATIVE NEGATIVE   Ketones, ur NEGATIVE NEGATIVE mg/dL   Protein, ur NEGATIVE NEGATIVE mg/dL   Nitrite NEGATIVE NEGATIVE   Leukocytes,Ua LARGE (A) NEGATIVE   RBC / HPF 21-50 0 - 5 RBC/hpf   WBC, UA 21-50 0 - 5 WBC/hpf   Bacteria, UA RARE (A) NONE SEEN   Mucus PRESENT     Comment: Performed at [facility] [address], [place], [place] [address]  Urine culture     Status: Abnormal  (Preliminary result)   Collection Time: 09/14/18 10:11 AM   Specimen: Urine, Random  Result Value Ref Range   Specimen Description URINE, RANDOM    Special Requests      NONE Performed at [facility], [place] [address], [place], [place] [address]    Culture >=100,000 COLONIES/mL STENOTROPHOMONAS MALTOPHILIA (A)    Report Status PENDING   POC occult blood, ED     Status: Abnormal   Collection Time: 09/14/18 11:49 AM  Result Value Ref Range   Fecal Occult Bld POSITIVE (A) NEGATIVE  Novel Coronavirus,NAA,(SEND-OUT TO REF LAB - TAT 24-48 hrs); Hosp Order     Status: None   Collection Time: 09/14/18 12:50 PM   Specimen: Nasopharyngeal Swab; Respiratory  Result Value Ref Range   SARS-CoV-2, NAA NOT DETECTED NOT DETECTED    Comment: (NOTE) This test was developed and its performance characteristics determined by [employer]. This test has not been FDA cleared or approved. This test has been authorized by FDA under an Emergency Use Authorization (EUA). This test is only authorized for the duration of time the declaration that circumstances exist justifying the authorization of the emergency use of in vitro diagnostic tests for detection of SARS-CoV-2 virus and/or diagnosis of COVID-19 infection under section 564(b)(1) of the Act, 21 U.S.C. 882CMK-3(K)(9), unless the authorization is terminated or revoked sooner. When diagnostic testing is negative, the possibility of a false negative result should be considered in the context of a patient's recent exposures and the presence of clinical signs and symptoms consistent with COVID-19. An individual without symptoms of COVID-19 and who is not shedding SARS-CoV-2 virus would expect to have a negative (not detected) result in this assay. Performed  At: [facility] [place], [place] [id number] Rush Farmer MD [id number]    Coronavirus Source NASOPHARYNGEAL     Comment: Performed at [facility], [place] [address] [place],  [address]  Basic metabolic panel     Status: Abnormal   Collection Time: 09/15/18  4:55 AM  Result Value Ref Range   Sodium 139 135 - 145 mmol/L   Potassium 4.3 3.5 - 5.1 mmol/L   Chloride 109 98 - 111 mmol/L   CO2 18 (L) 22 - 32 mmol/L   Glucose, Bld 133 (H) 70 - 99 mg/dL   BUN 72 (H) 8 - 23 mg/dL   Creatinine, Ser 5.26 (H) 0.61 - 1.24 mg/dL   Calcium 9.0 8.9 - 10.3 mg/dL   GFR calc non Af Amer 9 (L) >60 mL/min   GFR calc Af Amer 10 (L) >60 mL/min   Anion gap 12 5 - 15    Comment: Performed at [facility] [address], [place] [address]  CBC     Status: Abnormal   Collection Time: 09/15/18  4:55 AM  Result Value Ref Range   WBC 7.4 4.0 - 10.5 K/uL   RBC 2.70 (L) 4.22 - 5.81 MIL/uL   Hemoglobin 8.3 (  L) 13.0 - 17.0 g/dL   HCT 25.0 (L) 39.0 - 52.0 %   MCV 92.6 80.0 - 100.0 fL   MCH 30.7 26.0 - 34.0 pg   MCHC 33.2 30.0 - 36.0 g/dL   RDW 14.6 11.5 - 15.5 %   Platelets 148 (L) 150 - 400 K/uL   nRBC 0.0 0.0 - 0.2 %    Comment: Performed at [facility] [address], [place], [place] [address]  Basic metabolic panel     Status: Abnormal   Collection Time: 09/16/18 12:31 PM  Result Value Ref Range   Sodium 140 135 - 145 mmol/L   Potassium 4.0 3.5 - 5.1 mmol/L   Chloride 108 98 - 111 mmol/L   CO2 19 (L) 22 - 32 mmol/L   Glucose, Bld 153 (H) 70 - 99 mg/dL   BUN 59 (H) 8 - 23 mg/dL   Creatinine, Ser 4.51 (H) 0.61 - 1.24 mg/dL   Calcium 9.1 8.9 - 10.3 mg/dL   GFR calc non Af Amer 11 (L) >60 mL/min   GFR calc Af Amer 12 (L) >60 mL/min   Anion gap 13 5 - 15    Comment: Performed at [facility] [address] [place], [place] [address]   Recent Results (from the past 240 hour(s))  Urine culture     Status: Abnormal (Preliminary result)   Collection Time: 09/14/18 10:11 AM   Specimen: Urine, Random  Result Value Ref Range Status   Specimen Description URINE, RANDOM  Final   Special Requests   Final     NONE Performed at [facility], [place] [address], [place], [place] [address]    Culture >=100,000 COLONIES/mL STENOTROPHOMONAS MALTOPHILIA (A)  Final   Report Status PENDING  Incomplete  Novel Coronavirus,NAA,(SEND-OUT TO REF LAB - TAT 24-48 hrs); Hosp Order     Status: None   Collection Time: 09/14/18 12:50 PM   Specimen: Nasopharyngeal Swab; Respiratory  Result Value Ref Range Status   SARS-CoV-2, NAA NOT DETECTED NOT DETECTED Final    Comment: (NOTE) This test was developed and its performance characteristics determined by [employer]. This test has not been FDA cleared or approved. This test has been authorized by FDA under an Emergency Use Authorization (EUA). This test is only authorized for the duration of time the declaration that circumstances exist justifying the authorization of the emergency use of in vitro diagnostic tests for detection of SARS-CoV-2 virus and/or diagnosis of COVID-19 infection under section 564(b)(1) of the Act, 21 U.S.C. 324MWN-0(U)(7), unless the authorization is terminated or revoked sooner. When diagnostic testing is negative, the possibility of a false negative result should be considered in the context of a patient's recent exposures and the presence of clinical signs and symptoms consistent with COVID-19. An individual without symptoms of COVID-19 and who is not shedding SARS-CoV-2 virus would expect to have a negative (not detected) result in this assay. Performed  At: [facility] [address] [place], [place] [id number] Rush Farmer MD [id number]    [place]  Final    Comment: Performed at [facility], [place] [address], [place], [place] [address]   Creatinine: Recent Labs    09/13/18 1503 09/14/18 0949 09/15/18 0455 09/16/18 1231  CREATININE 5.35* 5.51* 5.26* 4.51*   Baseline Creatinine: 1.5  Impression/Assessment:  85yo with bilateral hydronephrosis, hx of prostate cancer,  and urethral stricture managed with SP tube  Plan:  1.Bilateral hydronephrosis: I discussed the  natural history of hydronephrosis and the various causes. His bilateral hydronephrosis is likely related to a poorly compliant bladder versus a malignancy causes obstruction of his ureters. The patient is currently making urine and his SP tube is draining well. The last SP tube change was several weeks ago and the patient would benefit from [facility] tube change. If his creatinien fails to imrpove with SP tube change the patient will need bilateral nephrostomy tube placement 2. Urethral stricture disease: managed with SP tube. 2.  3. Hx of prostate cancer: we will order a PSA to assess for recurrent prostate cancer  Nicolette Bang 09/16/2018, 3:12 PM

## 2018-09-16 NOTE — Progress Notes (Signed)
Patient had 8 beats of non-sustained Ventricular tachycardia this morning. Patient verbalized no symptoms and was resting comfortably during assessment. [place], NP notified, Will continue to monitor.

## 2018-09-16 NOTE — Progress Notes (Signed)
PROGRESS NOTE  Ronald [name] ZRA:[id number] DOB: [birth year] DOA: 09/14/2018 PCP: Isaac Bliss, [name], MD   LOS: 2 days   Brief narrative: The patient is a89 y.o.year old malewith a past medical history significant for CAD status post CABG, ischemic cardiomyopathy, A. fib on Eliquis, chronic kidney disease, prostate cancer with suprapubic catheter he was evaluated by his cardiologist on 6/18 for shortness of breath and weakness.  He was found to have a worsening creatinine of 5.3. He was sent to the ED for further evaluation.  In the ED, he was also noted to have fecal occult blood test positive.  Was also noted to have leakage from around the suprapubic catheter site. He was admitted under hospitalist service.  Nephrology GI and urology were consulted.  Subjective: Patient was seen and examined this morning.  Pleasant elderly Caucasian male.  Sitting up in chair.  Not in distress.  No new symptoms. Did not have blood work done today.  Assessment/Plan:  Active Problems:   Acute renal failure (ARF) (HCC)  Acute kidney injury on CKD stage III - baseline serum creatinine around 1.1-1.5.  Creatinine on admission was elevated to 5.35.  Down to 5.26 on last check yesterday.  No labs today.  Repeat blood work tomorrow - No proteinuria in urinalysis. - Sodium bicarbonate for acidosis.  Chronic bladder obstruction status post suprapubic catheter  - In the ED, he was noted to have some leakage from around his suprapubic catheter.Renal ultrasound revealed bilateral hydronephrosis, moderately severe on the right and moderate on the left.  -Patient's most recent episode of acute kidney injury during his admission on 08/30/2018 was attributed to a suprapubiccatheterthat was blocked for at least 3 days. That was exchanged on 6/2, with improvement in his renal function. Need to flush suprapubic catheter regularly.  -Urology was consulted by nephrology.  UTI -More than 100,000 CFU per mL of  stenotrophomonas ultimately isolated.  Pending sensitivity.  Currently on IV Rocephin.  Nonsustained V. Tach -8 beats of asymptomatic nonsustained V. tach noted in telemetry last night.  Potassium 4, obtain magnesium level.  Anemia.  -No obvious melena, hematochezia or hemodynamic instability.  GI was consulted.  Did not recommend any endoscopy intervention.  Continue PPI twice daily.  Continue regular diet.  Body mass index is 24.81 kg/m. Mobility: Encourage ambulation Diet: Cardiac diet DVT prophylaxis: SCDs Code Status:  Code Status: Full Code  Family Communication: Expected Discharge: Remain inpatient for now.  Ultimate disposition depends on renal recovery.   Antimicrobials:  Anti-infectives (From admission, onward)   Start     Dose/Rate Route Frequency Ordered Stop   09/14/18 1500  cefTRIAXone (ROCEPHIN) 1 g in sodium chloride 0.9 % 100 mL IVPB     1 g 200 mL/hr over 30 Minutes Intravenous Every 24 hours 09/14/18 1410     09/14/18 1415  cefTRIAXone (ROCEPHIN) injection 1 g  Status:  Discontinued     1 g Intramuscular Every 24 hours 09/14/18 1407 09/14/18 1410      Infusions:  . cefTRIAXone (ROCEPHIN)  IV 1 g (09/15/18 1513)    Scheduled Meds: . atorvastatin  40 mg Oral QPM  . isosorbide mononitrate  15 mg Oral Daily  . multivitamin with minerals  1 tablet Oral Daily  . pantoprazole  40 mg Oral BID  . polyvinyl alcohol  1 drop Both Eyes TID  . sodium bicarbonate  650 mg Oral BID    PRN meds: acetaminophen **OR** acetaminophen, meclizine, nitroGLYCERIN, ondansetron   Objective: Vitals:  09/16/18 0534 09/16/18 0924  BP: (!) 155/83 128/63  Pulse: 74 73  Resp: 16 18  Temp: 97.9 F (36.6 C) 98 F (36.7 C)  SpO2: 98% 98%    Intake/Output Summary (Last 24 hours) at 09/16/2018 1347 Last data filed at 09/16/2018 1014 Gross per 24 hour  Intake 1500 ml  Output 3975 ml  Net -2475 ml   Filed Weights   09/14/18 0938  Weight: 67.6 kg   Weight change:   Body mass index is 24.81 kg/m.   Physical Exam: General exam: Appears calm and comfortable.  Skin: No rashes, lesions or ulcers. HEENT: Atraumatic, normocephalic, supple neck, no obvious bleeding Lungs: Clear to auscultation bilaterally CVS: Regular rate and rhythm, no murmur GI/Abd soft, nontender, nondistended, bowel sound present CNS: Alert, awake, oriented x3 Psychiatry: Mood appropriate Extremities: No pedal edema, no calf tenderness  Data Review: I have personally reviewed the laboratory data and studies available.  Recent Labs  Lab 09/13/18 1503 09/14/18 0949 09/15/18 0455  WBC 9.6 7.8 7.4  NEUTROABS  --  5.7  --   HGB 9.6* 8.6* 8.3*  HCT 28.4* 26.3* 25.0*  MCV 90 92.6 92.6  PLT 177 147* 148*   Recent Labs  Lab 09/13/18 1503 09/14/18 0949 09/15/18 0455 09/16/18 1231  NA 139 134* 139 140  K 5.3* 4.5 4.3 4.0  CL 103 103 109 108  CO2 19* 18* 18* 19*  GLUCOSE 130* 122* 133* 153*  BUN 70* 76* 72* 59*  CREATININE 5.35* 5.51* 5.26* 4.51*  CALCIUM 9.4 9.2 9.0 9.1    Terrilee Croak, MD  [facility] 09/16/2018

## 2018-09-16 NOTE — Plan of Care (Signed)
  Problem: Education: Goal: Knowledge of General Education information will improve Description: Including pain rating scale, medication(s)/side effects and non-pharmacologic comfort measures Outcome: Progressing   Problem: Clinical Measurements: Goal: Diagnostic test results will improve Outcome: Progressing   

## 2018-09-17 DIAGNOSIS — R001 Bradycardia, unspecified: Secondary | ICD-10-CM | POA: Diagnosis not present

## 2018-09-17 DIAGNOSIS — I4891 Unspecified atrial fibrillation: Secondary | ICD-10-CM | POA: Diagnosis not present

## 2018-09-17 DIAGNOSIS — R42 Dizziness and giddiness: Secondary | ICD-10-CM | POA: Diagnosis not present

## 2018-09-17 DIAGNOSIS — I493 Ventricular premature depolarization: Secondary | ICD-10-CM | POA: Diagnosis not present

## 2018-09-17 DIAGNOSIS — D631 Anemia in chronic kidney disease: Secondary | ICD-10-CM

## 2018-09-17 LAB — CBC WITH DIFFERENTIAL/PLATELET
Abs Immature Granulocytes: 0.02 10*3/uL (ref 0.00–0.07)
Basophils Absolute: 0 10*3/uL (ref 0.0–0.1)
Basophils Relative: 1 %
Eosinophils Absolute: 0.2 10*3/uL (ref 0.0–0.5)
Eosinophils Relative: 3 %
HCT: 26.7 % — ABNORMAL LOW (ref 39.0–52.0)
Hemoglobin: 8.9 g/dL — ABNORMAL LOW (ref 13.0–17.0)
Immature Granulocytes: 0 %
Lymphocytes Relative: 15 %
Lymphs Abs: 1 10*3/uL (ref 0.7–4.0)
MCH: 30.9 pg (ref 26.0–34.0)
MCHC: 33.3 g/dL (ref 30.0–36.0)
MCV: 92.7 fL (ref 80.0–100.0)
Monocytes Absolute: 0.8 10*3/uL (ref 0.1–1.0)
Monocytes Relative: 13 %
Neutro Abs: 4.5 10*3/uL (ref 1.7–7.7)
Neutrophils Relative %: 68 %
Platelets: 169 10*3/uL (ref 150–400)
RBC: 2.88 MIL/uL — ABNORMAL LOW (ref 4.22–5.81)
RDW: 14.5 % (ref 11.5–15.5)
WBC: 6.5 10*3/uL (ref 4.0–10.5)
nRBC: 0 % (ref 0.0–0.2)

## 2018-09-17 LAB — URINE CULTURE: Culture: >=100000 — AB

## 2018-09-17 LAB — BASIC METABOLIC PANEL
Anion gap: 10 (ref 5–15)
BUN: 51 mg/dL — ABNORMAL HIGH (ref 8–23)
CO2: 22 mmol/L (ref 22–32)
Calcium: 9 mg/dL (ref 8.9–10.3)
Chloride: 108 mmol/L (ref 98–111)
Creatinine, Ser: 4.11 mg/dL — ABNORMAL HIGH (ref 0.61–1.24)
GFR calc Af Amer: 14 mL/min — ABNORMAL LOW (ref >60)
GFR calc non Af Amer: 12 mL/min — ABNORMAL LOW (ref >60)
Glucose, Bld: 120 mg/dL — ABNORMAL HIGH (ref 70–99)
Potassium: 3.7 mmol/L (ref 3.5–5.1)
Sodium: 140 mmol/L (ref 135–145)

## 2018-09-17 LAB — MAGNESIUM: Magnesium: 2 mg/dL (ref 1.7–2.4)

## 2018-09-17 MED ORDER — APIXABAN 2.5 MG PO TABS
2.5000 mg | ORAL_TABLET | Freq: Two times a day (BID) | ORAL | Status: DC
  Administered 2018-09-17 – 2018-09-20 (×6): 2.5 mg via ORAL
  Filled 2018-09-17 (×7): qty 1

## 2018-09-17 MED ORDER — BISACODYL 5 MG PO TBEC
5.0000 mg | DELAYED_RELEASE_TABLET | Freq: Every day | ORAL | Status: DC | PRN
  Administered 2018-09-17 – 2018-09-18 (×2): 5 mg via ORAL
  Filled 2018-09-17 (×3): qty 1

## 2018-09-17 MED ORDER — LEVOFLOXACIN 500 MG PO TABS
250.0000 mg | ORAL_TABLET | ORAL | Status: DC
  Administered 2018-09-17 – 2018-09-19 (×2): 250 mg via ORAL
  Filled 2018-09-17 (×3): qty 1

## 2018-09-17 MED ORDER — SENNOSIDES-DOCUSATE SODIUM 8.6-50 MG PO TABS
1.0000 | ORAL_TABLET | Freq: Every day | ORAL | Status: DC
  Administered 2018-09-17 – 2018-09-19 (×3): 1 via ORAL
  Filled 2018-09-17 (×3): qty 1

## 2018-09-17 NOTE — Plan of Care (Signed)
  Problem: Clinical Measurements: Goal: Diagnostic test results will improve Outcome: Progressing   Problem: Nutrition: Goal: Adequate nutrition will be maintained Outcome: Progressing   

## 2018-09-17 NOTE — Progress Notes (Signed)
   09/17/18 1600  Clinical Encounter Type  Visited With Patient;Health care provider  Visit Type Initial;Spiritual support;Social support  Referral From Nurse  Spiritual Encounters  Spiritual Needs Prayer;Emotional  Stress Factors  Patient Stress Factors Exhausted;Health changes;Loss of control   Met w/ pt, he had a [employer] route that came to a vending machine at or near this hospital when it was originally under Architect.  He talked about his multiple bypasses plus cancer in the last 36 yrs.  Frustrated that he isn't being told or can't hear and understand what the drs tell him.  Prayed w/ pt.  Myra Gianotti resident, [phone]

## 2018-09-17 NOTE — Progress Notes (Signed)
Suprapubic catheter changed by 4W Charge RN.

## 2018-09-17 NOTE — Plan of Care (Signed)
  Problem: Activity: Goal: Risk for activity intolerance will decrease Outcome: Progressing   

## 2018-09-17 NOTE — Progress Notes (Signed)
[facility]    NEPHROLOGY PROGRESS NOTE  SUBJECTIVE: Resting comfortably without complaints.  Patient seen and examined.  No acute events.  Denies chest pain, shortness of breath, nausea, vomiting, diarrhea or dysuria.  All other review of systems are negative.   OBJECTIVE:  Vitals:   09/17/18 0443 09/17/18 0845  BP: 137/70 (!) 141/69  Pulse: 71 79  Resp: 18 17  Temp:  97.7 F (36.5 C)  SpO2: 98% 100%    Intake/Output Summary (Last 24 hours) at 09/17/2018 1617 Last data filed at 09/17/2018 1330 Gross per 24 hour  Intake 800 ml  Output 3850 ml  Net -3050 ml      General:  AAOx3 NAD HEENT: MMM Martin AT anicteric sclera Neck:  No JVD, no adenopathy CV:  Heart RRR  Lungs:  L/S CTA bilaterally Abd:  abd SNT/ND with normal BS GU:  Bladder non-palpable, positive suprapubic catheter Extremities:  No LE edema. Skin:  No skin rash  MEDICATIONS:  . apixaban  2.5 mg Oral BID  . atorvastatin  40 mg Oral QPM  . isosorbide mononitrate  15 mg Oral Daily  . levofloxacin  250 mg Oral Q48H  . multivitamin with minerals  1 tablet Oral Daily  . pantoprazole  40 mg Oral BID  . polyvinyl alcohol  1 drop Both Eyes TID  . senna-docusate  1 tablet Oral QHS  . sodium bicarbonate  650 mg Oral BID       LABS:   CBC Latest Ref Rng & Units 09/17/2018 09/15/2018 09/14/2018  WBC 4.0 - 10.5 K/uL 6.5 7.4 7.8  Hemoglobin 13.0 - 17.0 g/dL 8.9(L) 8.3(L) 8.6(L)  Hematocrit 39.0 - 52.0 % 26.7(L) 25.0(L) 26.3(L)  Platelets 150 - 400 K/uL 169 148(L) 147(L)    CMP Latest Ref Rng & Units 09/17/2018 09/16/2018 09/15/2018  Glucose 70 - 99 mg/dL 120(H) 153(H) 133(H)  BUN 8 - 23 mg/dL 51(H) 59(H) 72(H)  Creatinine 0.61 - 1.24 mg/dL 4.11(H) 4.51(H) 5.26(H)  Sodium 135 - 145 mmol/L 140 140 139  Potassium 3.5 - 5.1 mmol/L 3.7 4.0 4.3  Chloride 98 - 111 mmol/L 108 108 109  CO2 22 - 32 mmol/L 22 19(L) 18(L)  Calcium 8.9 - 10.3 mg/dL 9.0 9.1 9.0  Total Protein 6.5 - 8.1 g/dL - - -  Total  Bilirubin 0.3 - 1.2 mg/dL - - -  Alkaline Phos 38 - 126 U/L - - -  AST 15 - 41 U/L - - -  ALT 0 - 44 U/L - - -    Lab Results  Component Value Date   CALCIUM 9.0 09/17/2018   CAION 1.24 12/26/2017   PHOS 3.3 01/20/2016       Component Value Date/Time   COLORURINE YELLOW 09/14/2018 1003   APPEARANCEUR HAZY (A) 09/14/2018 1003   LABSPEC 1.004 (L) 09/14/2018 1003   PHURINE 6.0 09/14/2018 1003   GLUCOSEU NEGATIVE 09/14/2018 1003   HGBUR LARGE (A) 09/14/2018 1003   BILIRUBINUR NEGATIVE 09/14/2018 1003   BILIRUBINUR neg 06/30/2014 1530   KETONESUR NEGATIVE 09/14/2018 1003   PROTEINUR NEGATIVE 09/14/2018 1003   UROBILINOGEN 0.2 12/04/2014 2344   NITRITE NEGATIVE 09/14/2018 1003   LEUKOCYTESUR LARGE (A) 09/14/2018 1003      Component Value Date/Time   PHART 7.417 01/14/2016 1219   PCO2ART 24.7 (L) 01/14/2016 1219   PO2ART 50.0 (L) 01/14/2016 1219   HCO3 16.0 (L) 01/14/2016 1219   TCO2 21 (L) 12/26/2017 0001   ACIDBASEDEF 7.0 (H) 01/14/2016 1219  O2SAT 87.0 01/14/2016 1219    No results found for: IRON, TIBC, FERRITIN, IRONPCTSAT     ASSESSMENT/PLAN:    The patient is a 85 y.o. year old male with a past medical history significant for CAD status post CABG, GERD, chronic kidney disease, prostate cancer with suprapubic catheter he was evaluated by his cardiologist and was found to have a worsening creatinine of 5.3.  He complained of associated weakness and increased shortness of breath.  He was sent to the emergency department for further evaluation.  Review of records reveals a creatinine of 3.29 on 09/01/2018, and a creatinine of 5.51 on 09/14/2018.  Previously, he had a serum creatinine of 1.1-1.5 in March 2020.  He denies any interval health issues.    1.  Chronic kidney disease stage III with a baseline serum creatinine around 1.1-1.5.  2.  Acute kidney injury.  Urinalysis is without proteinuria, but large blood is noted.  He looks to be somewhat fluid overloaded.  His most  recent episode of acute kidney injury during his admission on 08/30/2018 was attributed to a suprapubic catheter that was blocked for at least 3 days.  That was exchanged on 6/2, with improvement in his renal function.  Will flush suprapubic catheter regularly.  Renal ultrasound revealed bilateral hydronephrosis, moderately severe on the right and moderate on the left.  Urology following closely.  Excellent urine output.    3.  Suprapubic catheter.  As above.  Urology to manage  4.  Anemia.  Trend hemoglobin.  Would heme test stools for completeness.  5.  Metabolic acidosis.  Likely secondary to obstructive uropathy.  We will cont sodium bicarbonate.     [place], DO, [place]

## 2018-09-17 NOTE — Care Management Important Message (Signed)
Important Message  Patient Details  Name: Ronald [name] MRN: [id number] Date of Birth: [birth year]   Medicare Important Message Given:  Yes     [name] 09/17/2018, 2:28 PM

## 2018-09-17 NOTE — Progress Notes (Signed)
Subjective: [name] is doing well without complaints.  His urine remains clear with good output.  Morning labs pending.  He has no associated signs or symptoms.  ROS:  Review of Systems  Constitutional: Negative for fever.  Gastrointestinal: Negative for abdominal pain.  Genitourinary: Negative for flank pain.    Anti-infectives: Anti-infectives (From admission, onward)   Start     Dose/Rate Route Frequency Ordered Stop   09/14/18 1500  cefTRIAXone (ROCEPHIN) 1 g in sodium chloride 0.9 % 100 mL IVPB     1 g 200 mL/hr over 30 Minutes Intravenous Every 24 hours 09/14/18 1410     09/14/18 1415  cefTRIAXone (ROCEPHIN) injection 1 g  Status:  Discontinued     1 g Intramuscular Every 24 hours 09/14/18 1407 09/14/18 1410      Current Facility-Administered Medications  Medication Dose Route Frequency Provider Last Rate Last Dose  . acetaminophen (TYLENOL) tablet 650 mg  650 mg Oral Q6H PRN Merton Border, MD   650 mg at 09/15/18 1520   Or  . acetaminophen (TYLENOL) suppository 650 mg  650 mg Rectal Q6H PRN Merton Border, MD      . atorvastatin (LIPITOR) tablet 40 mg  40 mg Oral QPM Merton Border, MD   40 mg at 09/16/18 1720  . cefTRIAXone (ROCEPHIN) 1 g in sodium chloride 0.9 % 100 mL IVPB  1 g Intravenous Q24H Merton Border, MD 200 mL/hr at 09/16/18 1436 1 g at 09/16/18 1436  . isosorbide mononitrate (IMDUR) 24 hr tablet 15 mg  15 mg Oral Daily Merton Border, MD   15 mg at 09/16/18 1001  . meclizine (ANTIVERT) tablet 12.5 mg  12.5 mg Oral TID PRN Merton Border, MD      . multivitamin with minerals tablet 1 tablet  1 tablet Oral Daily Merton Border, MD   1 tablet at 09/16/18 1002  . nitroGLYCERIN (NITROSTAT) SL tablet 0.4 mg  0.4 mg Sublingual Q5 min PRN Merton Border, MD      . ondansetron (ZOFRAN-ODT) disintegrating tablet 4 mg  4 mg Oral Q8H PRN Merton Border, MD      . pantoprazole (PROTONIX) EC tablet 40 mg  40 mg Oral BID Terrilee Croak, MD   40 mg at 09/16/18 2251  . polyvinyl alcohol (LIQUIFILM  TEARS) 1.4 % ophthalmic solution 1 drop  1 drop Both Eyes TID Merton Border, MD   1 drop at 09/16/18 1721  . sodium bicarbonate tablet 650 mg  650 mg Oral BID Finnigan, Nancy A, DO   650 mg at 09/16/18 2251     Objective: Vital signs in last 24 hours: Temp:  [98 F (36.7 C)-98.2 F (36.8 C)] 98.2 F (36.8 C) (06/21 2100) Pulse Rate:  [71-76] 71 (06/22 0443) Resp:  [18] 18 (06/22 0443) BP: (128-157)/(63-86) 137/70 (06/22 0443) SpO2:  [98 %-100 %] 98 % (06/22 0443) Weight:  [59 kg] 59 kg (06/22 0500)  Intake/Output from previous day: 06/21 0701 - 06/22 0700 In: 1300 [P.O.:1200; IV Piggyback:100] Out: 4500 [Urine:4500] Intake/Output this shift: No intake/output data recorded.   Physical Exam Vitals signs reviewed.  Constitutional:      Appearance: Normal appearance.  Abdominal:     General: Abdomen is flat.     Palpations: Abdomen is soft.     Tenderness: There is no abdominal tenderness.     Comments: SP tube draining well and in good position.   Neurological:     Mental Status: He is alert.     Lab  Results:  Recent Labs    09/14/18 0949 09/15/18 0455  WBC 7.8 7.4  HGB 8.6* 8.3*  HCT 26.3* 25.0*  PLT 147* 148*   BMET Recent Labs    09/15/18 0455 09/16/18 1231  NA 139 140  K 4.3 4.0  CL 109 108  CO2 18* 19*  GLUCOSE 133* 153*  BUN 72* 59*  CREATININE 5.26* 4.51*  CALCIUM 9.0 9.1   PT/INR No results for input(s): LABPROT, INR in the last 72 hours. ABG No results for input(s): PHART, HCO3 in the last 72 hours.  Invalid input(s): PCO2, PO2  Studies/Results: Ct Renal Stone Study  Result Date: 09/15/2018 CLINICAL DATA:  Chronic kidney disease with prostate cancer and suprapubic catheter. Worsening creatinine. Weakness and increased shortness of breath. EXAM: CT ABDOMEN AND PELVIS WITHOUT CONTRAST TECHNIQUE: Multidetector CT imaging of the abdomen and pelvis was performed following the standard protocol without IV contrast. COMPARISON:  02/19/2017  FINDINGS: Lower chest: Sternotomy wires are present. Mild cardiomegaly. Minimal scarring over the lung bases. Hepatobiliary: Prior cholecystectomy. No focal liver mass. Biliary tree is normal. Pancreas: Stable low-density focus over the head of the pancreas not well visualized on this noncontrast study. Spleen: Normal. Adrenals/Urinary Tract: Adrenal glands are normal. Kidneys are normal in size with stable large right renal cyst. There is new moderate bilateral hydroureteronephrosis. Suprapubic catheter again noted over the bladder which is decompressed. Mild increased density material within the decompressed bladder which may represent hemorrhagic debris. No definite focal mass. Stomach/Bowel: Stomach and small bowel are normal. Appendix is normal. There is diverticulosis of the colon most prominent over the sigmoid colon. No evidence of acute diverticulitis. Vascular/Lymphatic: Moderate calcified plaque over the abdominal aorta. No adenopathy. Reproductive: Multiple radiation seed implants over the prostate unchanged. Other: No free fluid or focal inflammatory change. Musculoskeletal: Moderate degenerative changes of the spine and mild degenerate change of the hips. IMPRESSION: Moderate symmetric bilateral hydroureteronephrosis which is new since the previous exam. Suprapubic catheter is in adequate position within a decompressed bladder. Mild increased density material within the bladder which may represent hemorrhagic debris. Moderate diverticulosis of the colon without active inflammation. Moderate size right renal cyst unchanged. Stable 1.5 cm fluid attenuation focus over the pancreatic head not well evaluated on this noncontrast study. Recommend follow-up CT 1 year. Aortic Atherosclerosis (ICD10-I70.0). Electronically Signed   By: Marin Olp M.D.   On: 09/15/2018 14:59     Assessment and Plan: Bilateral hydro with acute on chronic renal insufficiency and history of outlet obstruction with chronic SP  tube.   He continues to have excellent urine output.  His Cr is pending.  I briefly discussed percutaneous nephrostomies but will defer recommending that pending further renal function testing.   He needs the SP tube changed and I have requested the proper catheter be brought to the bedside.  UTI with culture sensitivities pending.        LOS: 3 days    Irine Seal 09/17/2018 [id number]

## 2018-09-17 NOTE — Progress Notes (Addendum)
PROGRESS NOTE  Ronald [name] OHY:[id number] DOB: [birth year] DOA: 09/14/2018 PCP: Isaac Bliss, [name], MD   LOS: 3 days   Brief narrative: The patient is a89 y.o.year old malewith a past medical history significant for CAD status post CABG, ischemic cardiomyopathy, A. fib on Eliquis,chronic kidney disease, prostate cancer with suprapubic catheter he was evaluated by his cardiologiston 6/18for shortness of breath and weakness. Hewas found to have a worsening creatinine of 5.3. He was sent to the EDfor further evaluation.   In the ED, he was also noted to have fecal occult blood test positive. Was also noted to have leakage from around the suprapubic catheter site. CT obtained in the ER shows new bilateral hydronephrosis to the level of the UVJ and a decompress thick walled bladder He was admitted under hospitalist service.Nephrology GI and urology were consulted.  Subjective: Patient was seen and examined this morning.  Lying down in bed.  Not in distress.  No new symptoms.  Suprapubic catheter changed this morning. Chart reviewed.  No fever, creatinine down to 4.11, hemoglobin 8.9.  Assessment/Plan:  Active Problems:   Acute renal failure (ARF) (HCC)   Anemia due to chronic kidney disease  Acute kidney injury on CKD stage III -baseline serum creatinine around 1.1-1.5. -Creatinine on admission was elevated to 5.35.   Likely postrenal due to bilateral hydronephrosis.  Making significant urine output since admission.  Creatinine improving as well, 4.11 today. -No proteinuria in urinalysis. -Sodium bicarbonate for acidosis.  Chronic bladder obstruction status post suprapubic catheter -In the ED, he was noted to have some leakage from around his suprapubic catheter.Renal ultrasound revealed bilateral hydronephrosis, moderately severe on the right and moderate on the left. -Patient'smost recent episode of acute kidney injury during his admission on 08/30/2018 was  attributed to a suprapubiccatheterthat was blocked for at least 3 days. That was exchanged on 6/2, with improvement in his renal function.Need toflush suprapubic catheter regularly.  -Urology was consulted. Suprapubic catheter changed today.  UTI -More than 100,000 CFU per mL of stenotrophomonas ultimately isolated, sensitive to Levaquin.  Antibiotic switched to oral Levaquin accordingly.    Anemia.  -No obvious melena, hematochezia or hemodynamic instability. GI was consulted. Did not recommend any endoscopy intervention. Continue PPI twice daily. Continue regular diet.  CAD status post CABG -Prior to admission, patient was on statin, Imdur which we will continue.  Ischemic cardiomyopathy -History of systolic heart failure but EF improved.  Was on Lasix 20 mg daily at home.  Currently on hold because of AKI.  A. Fib - on Eliquis.  Was held because of GI bleed.  Hemoglobin is stable.  No plan of intervention.  Resume Eliquis.  Body mass index is 24.81 kg/m. Mobility:Encourage ambulation Diet:Cardiac diet DVT prophylaxis:SCDs Code Status:Code Status: Full Code Family Communication: Expected Discharge:Remain inpatient for now. Ultimate disposition depends on renal recovery.  Antimicrobials:  Anti-infectives (From admission, onward)   Start     Dose/Rate Route Frequency Ordered Stop   09/17/18 1330  levofloxacin (LEVAQUIN) tablet 250 mg     250 mg Oral Every 48 hours 09/17/18 1319     09/14/18 1500  cefTRIAXone (ROCEPHIN) 1 g in sodium chloride 0.9 % 100 mL IVPB  Status:  Discontinued     1 g 200 mL/hr over 30 Minutes Intravenous Every 24 hours 09/14/18 1410 09/17/18 1317   09/14/18 1415  cefTRIAXone (ROCEPHIN) injection 1 g  Status:  Discontinued     1 g Intramuscular Every 24 hours 09/14/18 1407 09/14/18 1410  Infusions:    Scheduled Meds: . apixaban  2.5 mg Oral BID  . atorvastatin  40 mg Oral QPM  . isosorbide mononitrate  15 mg Oral Daily  .  levofloxacin  250 mg Oral Q48H  . multivitamin with minerals  1 tablet Oral Daily  . pantoprazole  40 mg Oral BID  . polyvinyl alcohol  1 drop Both Eyes TID  . senna-docusate  1 tablet Oral QHS  . sodium bicarbonate  650 mg Oral BID    PRN meds: acetaminophen **OR** acetaminophen, bisacodyl, meclizine, nitroGLYCERIN, ondansetron   Objective: Vitals:   09/17/18 0443 09/17/18 0845  BP: 137/70 (!) 141/69  Pulse: 71 79  Resp: 18 17  Temp:  97.7 F (36.5 C)  SpO2: 98% 100%    Intake/Output Summary (Last 24 hours) at 09/17/2018 1535 Last data filed at 09/17/2018 1330 Gross per 24 hour  Intake 800 ml  Output 3850 ml  Net -3050 ml   Filed Weights   09/14/18 0938 09/17/18 0500  Weight: 67.6 kg 59 kg   Weight change:  Body mass index is 21.63 kg/m.   Physical Exam: General exam: Appears calm and comfortable.  Skin: No rashes, lesions or ulcers. HEENT: Atraumatic, normocephalic, supple neck, no obvious bleeding Lungs: Clear to auscultation bilaterally CVS: Regular rate and rhythm, no murmur GI/Abd soft, nontender, nondistended, bowel sound present CNS: Alert, awake, oriented x3 Psychiatry: Mood appropriate Extremities: No pedal edema, no calf tenderness  Data Review: I have personally reviewed the laboratory data and studies available.  Recent Labs  Lab 09/13/18 1503 09/14/18 0949 09/15/18 0455 09/17/18 0642  WBC 9.6 7.8 7.4 6.5  NEUTROABS  --  5.7  --  4.5  HGB 9.6* 8.6* 8.3* 8.9*  HCT 28.4* 26.3* 25.0* 26.7*  MCV 90 92.6 92.6 92.7  PLT 177 147* 148* 169   Recent Labs  Lab 09/13/18 1503 09/14/18 0949 09/15/18 0455 09/16/18 1231 09/17/18 0642  NA 139 134* 139 140 140  K 5.3* 4.5 4.3 4.0 3.7  CL 103 103 109 108 108  CO2 19* 18* 18* 19* 22  GLUCOSE 130* 122* 133* 153* 120*  BUN 70* 76* 72* 59* 51*  CREATININE 5.35* 5.51* 5.26* 4.51* 4.11*  CALCIUM 9.4 9.2 9.0 9.1 9.0  MG  --   --   --   --  2.0    Terrilee Croak, MD  [facility] 09/17/2018

## 2018-09-18 LAB — CBC WITH DIFFERENTIAL/PLATELET
Abs Immature Granulocytes: 0.03 10*3/uL (ref 0.00–0.07)
Basophils Absolute: 0 10*3/uL (ref 0.0–0.1)
Basophils Relative: 1 %
Eosinophils Absolute: 0.2 10*3/uL (ref 0.0–0.5)
Eosinophils Relative: 3 %
HCT: 28.9 % — ABNORMAL LOW (ref 39.0–52.0)
Hemoglobin: 9.4 g/dL — ABNORMAL LOW (ref 13.0–17.0)
Immature Granulocytes: 1 %
Lymphocytes Relative: 17 %
Lymphs Abs: 1 10*3/uL (ref 0.7–4.0)
MCH: 30.1 pg (ref 26.0–34.0)
MCHC: 32.5 g/dL (ref 30.0–36.0)
MCV: 92.6 fL (ref 80.0–100.0)
Monocytes Absolute: 0.7 10*3/uL (ref 0.1–1.0)
Monocytes Relative: 12 %
Neutro Abs: 3.8 10*3/uL (ref 1.7–7.7)
Neutrophils Relative %: 66 %
Platelets: 174 10*3/uL (ref 150–400)
RBC: 3.12 MIL/uL — ABNORMAL LOW (ref 4.22–5.81)
RDW: 14.4 % (ref 11.5–15.5)
WBC: 5.7 10*3/uL (ref 4.0–10.5)
nRBC: 0 % (ref 0.0–0.2)

## 2018-09-18 LAB — BASIC METABOLIC PANEL
Anion gap: 10 (ref 5–15)
BUN: 50 mg/dL — ABNORMAL HIGH (ref 8–23)
CO2: 23 mmol/L (ref 22–32)
Calcium: 9.3 mg/dL (ref 8.9–10.3)
Chloride: 107 mmol/L (ref 98–111)
Creatinine, Ser: 3.51 mg/dL — ABNORMAL HIGH (ref 0.61–1.24)
GFR calc Af Amer: 17 mL/min — ABNORMAL LOW (ref >60)
GFR calc non Af Amer: 15 mL/min — ABNORMAL LOW (ref >60)
Glucose, Bld: 121 mg/dL — ABNORMAL HIGH (ref 70–99)
Potassium: 3.9 mmol/L (ref 3.5–5.1)
Sodium: 140 mmol/L (ref 135–145)

## 2018-09-18 MED ORDER — BISACODYL 10 MG RE SUPP
10.0000 mg | Freq: Once | RECTAL | Status: AC
  Administered 2018-09-18: 10 mg via RECTAL
  Filled 2018-09-18: qty 1

## 2018-09-18 MED ORDER — SODIUM CHLORIDE 0.9 % IV SOLN
INTRAVENOUS | Status: AC
  Administered 2018-09-18: 22:00:00 via INTRAVENOUS

## 2018-09-18 NOTE — Progress Notes (Addendum)
[facility]    NEPHROLOGY PROGRESS NOTE  SUBJECTIVE: Pt with 2.2 liters  UOP over 6/22.  Feels ok today.  He's trying to hydrate orally.    Review of systems:  Denies shortness of breath  Denies chest pain Denies n/v    OBJECTIVE:  Vitals:   09/18/18 0849 09/18/18 1718  BP: 140/65 (!) 145/76  Pulse: 79 77  Resp: 20 16  Temp: 98 F (36.7 C) 98 F (36.7 C)  SpO2: 100% 100%    Intake/Output Summary (Last 24 hours) at 09/18/2018 1810 Last data filed at 09/18/2018 1721 Gross per 24 hour  Intake 595 ml  Output 3500 ml  Net -2905 ml      General:  AAOx3 NAD  HEENT: MMM [place] AT anicteric sclera Neck:  No JVD, no adenopathy CV:  Heart RRR  Lungs:  L/S CTA bilaterally Abd:  abd SNT/ND GU:  Bladder non-palpable, positive suprapubic catheter Extremities:  No LE edema. Skin:  No skin rash  MEDICATIONS:  . apixaban  2.5 mg Oral BID  . atorvastatin  40 mg Oral QPM  . isosorbide mononitrate  15 mg Oral Daily  . levofloxacin  250 mg Oral Q48H  . multivitamin with minerals  1 tablet Oral Daily  . pantoprazole  40 mg Oral BID  . polyvinyl alcohol  1 drop Both Eyes TID  . senna-docusate  1 tablet Oral QHS  . sodium bicarbonate  650 mg Oral BID       LABS:   CBC Latest Ref Rng & Units 09/18/2018 09/17/2018 09/15/2018  WBC 4.0 - 10.5 K/uL 5.7 6.5 7.4  Hemoglobin 13.0 - 17.0 g/dL 9.4(L) 8.9(L) 8.3(L)  Hematocrit 39.0 - 52.0 % 28.9(L) 26.7(L) 25.0(L)  Platelets 150 - 400 K/uL 174 169 148(L)    CMP Latest Ref Rng & Units 09/18/2018 09/17/2018 09/16/2018  Glucose 70 - 99 mg/dL 121(H) 120(H) 153(H)  BUN 8 - 23 mg/dL 50(H) 51(H) 59(H)  Creatinine 0.61 - 1.24 mg/dL 3.51(H) 4.11(H) 4.51(H)  Sodium 135 - 145 mmol/L 140 140 140  Potassium 3.5 - 5.1 mmol/L 3.9 3.7 4.0  Chloride 98 - 111 mmol/L 107 108 108  CO2 22 - 32 mmol/L 23 22 19(L)  Calcium 8.9 - 10.3 mg/dL 9.3 9.0 9.1  Total Protein 6.5 - 8.1 g/dL - - -  Total Bilirubin 0.3 - 1.2 mg/dL - - -  Alkaline Phos  38 - 126 U/L - - -  AST 15 - 41 U/L - - -  ALT 0 - 44 U/L - - -    Lab Results  Component Value Date   CALCIUM 9.3 09/18/2018   CAION 1.24 12/26/2017   PHOS 3.3 01/20/2016       Component Value Date/Time   COLORURINE YELLOW 09/14/2018 1003   APPEARANCEUR HAZY (A) 09/14/2018 1003   LABSPEC 1.004 (L) 09/14/2018 1003   PHURINE 6.0 09/14/2018 1003   GLUCOSEU NEGATIVE 09/14/2018 1003   HGBUR LARGE (A) 09/14/2018 1003   BILIRUBINUR NEGATIVE 09/14/2018 1003   BILIRUBINUR neg 06/30/2014 1530   KETONESUR NEGATIVE 09/14/2018 1003   PROTEINUR NEGATIVE 09/14/2018 1003   UROBILINOGEN 0.2 12/04/2014 2344   NITRITE NEGATIVE 09/14/2018 1003   LEUKOCYTESUR LARGE (A) 09/14/2018 1003      Component Value Date/Time   PHART 7.417 01/14/2016 1219   PCO2ART 24.7 (L) 01/14/2016 1219   PO2ART 50.0 (L) 01/14/2016 1219   HCO3 16.0 (L) 01/14/2016 1219   TCO2 21 (L) 12/26/2017 0001   ACIDBASEDEF 7.0 (H)  01/14/2016 1219   O2SAT 87.0 01/14/2016 1219    No results found for: IRON, TIBC, FERRITIN, IRONPCTSAT     ASSESSMENT/PLAN:    The patient is a 85 y.o. year old male with a past medical history significant for CAD status post CABG, GERD, chronic kidney disease, prostate cancer with suprapubic catheter he was evaluated by his cardiologist and was found to have a worsening creatinine of 5.3.  He complained of associated weakness and increased shortness of breath.  He was sent to the emergency department for further evaluation.  Review of records reveals a creatinine of 3.29 on 09/01/2018, and a creatinine of 5.51 on 09/14/2018.  Previously, he had a serum creatinine of 1.1-1.5 in March 2020.  He denies any interval health issues.    1.  Acute kidney injury.  Nonoliguric and resolving.  Urinalysis is without proteinuria, but large blood is noted.  hx obstructive uropathy.  His most recent episode of acute kidney injury during his admission on 08/30/2018 was attributed to a suprapubic catheter that was blocked  for at least 3 days.  That was exchanged on 6/2, with improvement in his renal function.  Continue to flush suprapubic catheter regularly.  Renal ultrasound revealed bilateral hydronephrosis, moderately severe on the right and moderate on the left.  Urology following closely and chronic SP tube was changed 6/21 per their note.  Gentle IV fluids x 1 liter and reassess  .   2.  Chronic kidney disease stage III with a baseline serum creatinine around 1.1-1.5.  3.  Suprapubic catheter.  As above.  Urology to manage  4.  Anemia.  Trend hemoglobin.  stable; no acute indication for PRBC's  5.  Metabolic acidosis.  Likely secondary to obstructive uropathy.  Improving.   Will discontinue sodium bicarbonate and monitor.

## 2018-09-18 NOTE — Progress Notes (Signed)
PROGRESS NOTE  Ronald [name] WYO:[id number] DOB: Mar 15, 1928 DOA: 09/14/2018 PCP: Isaac Bliss, [name], MD   LOS: 4 days   Brief narrative: The patient is a89 y.o.year old malewith a past medical history significant for CAD status post CABG, ischemic cardiomyopathy, A. fib on Eliquis,chronic kidney disease, prostate cancer with suprapubic catheter he was evaluated by his cardiologiston 6/18for shortness of breath and weakness. Hewas found to have a worsening creatinine of 5.3. He was sent to the EDfor further evaluation.   In the ED, he was also noted to have fecal occult blood test positive. Was also noted to have leakage from around the suprapubic catheter site. CT obtained in the ER shows new bilateral hydronephrosis to the level of the UVJ and a decompress thick walled bladder He was admitted under hospitalist service.Nephrology GI and urology were consulted.  Subjective: Patient was seen and examined this morning.  Sitting up in chair.  Not in distress.  No new symptoms.  Excellent urine output.  Creatinine improving, 3.51 today.  Assessment/Plan:  Active Problems:   Acute renal failure (ARF) (HCC)   Anemia due to chronic kidney disease  Acute kidney injury on CKD stage III -baseline serum creatinine around 1.1-1.5. -Creatinine on admission was elevated to 5.35.  Likely postrenal due to bilateral hydronephrosis.  Making significant urine output since admission.  Creatinine improving as well, 3.51 today. -No proteinuria in urinalysis. -Sodium bicarbonate for acidosis.  Chronic bladder obstruction status post suprapubic catheter -In the ED, he was noted to have some leakage from around his suprapubic catheter.Renal ultrasound revealed bilateral hydronephrosis, moderately severe on the right and moderate on the left. -Patient'smost recent episode of acute kidney injury during his admission on 08/30/2018 was attributed to a suprapubiccatheterthat was blocked  for at least 3 days. That was exchanged on 6/2, with improvement in his renal function.Need toflush suprapubic catheter regularly. -Urologywasconsulted. Suprapubic catheter changed 6/22.  UTI -More than 100,000 CFU per mL of stenotrophomonas ultimately isolated, sensitive to Levaquin.  Antibiotic switched to oral Levaquin accordingly.    Anemia.  -No obvious melena, hematochezia or hemodynamic instability. GI was consulted. Did not recommend any endoscopy intervention. Continue PPI twice daily. Continue regular diet.  Follow-up as an outpatient.  CAD status post CABG -Prior to admission, patient was on statin, Imdur which we will continue.  Ischemic cardiomyopathy -History of systolic heart failure but EF improved.  Was on Lasix 20 mg daily at home.  Currently on hold because of AKI.  A. Fib - on Eliquis.  Was held because of GI bleed.  Hemoglobin is stable.  No plan of intervention.  Resumed Eliquis.  Mobility:Encourage ambulation Diet:Cardiac diet DVT prophylaxis:SCDs Code Status:Code Status: Full Code Family Communication: Expected Discharge:Remain inpatient for now. Ultimate disposition to home depends on renal recovery.  Encouraged to ambulate on the hallway  Antimicrobials:  Anti-infectives (From admission, onward)   Start     Dose/Rate Route Frequency Ordered Stop   09/17/18 1330  levofloxacin (LEVAQUIN) tablet 250 mg     250 mg Oral Every 48 hours 09/17/18 1319     09/14/18 1500  cefTRIAXone (ROCEPHIN) 1 g in sodium chloride 0.9 % 100 mL IVPB  Status:  Discontinued     1 g 200 mL/hr over 30 Minutes Intravenous Every 24 hours 09/14/18 1410 09/17/18 1317   09/14/18 1415  cefTRIAXone (ROCEPHIN) injection 1 g  Status:  Discontinued     1 g Intramuscular Every 24 hours 09/14/18 1407 09/14/18 1410  Infusions:    Scheduled Meds: . apixaban  2.5 mg Oral BID  . atorvastatin  40 mg Oral QPM  . isosorbide mononitrate  15 mg Oral Daily  .  levofloxacin  250 mg Oral Q48H  . multivitamin with minerals  1 tablet Oral Daily  . pantoprazole  40 mg Oral BID  . polyvinyl alcohol  1 drop Both Eyes TID  . senna-docusate  1 tablet Oral QHS  . sodium bicarbonate  650 mg Oral BID    PRN meds: acetaminophen **OR** acetaminophen, bisacodyl, meclizine, nitroGLYCERIN, ondansetron   Objective: Vitals:   09/18/18 0423 09/18/18 0849  BP: (!) 143/79 140/65  Pulse: 76 79  Resp: 18 20  Temp: 97.9 F (36.6 C) 98 F (36.7 C)  SpO2: 99% 100%    Intake/Output Summary (Last 24 hours) at 09/18/2018 1535 Last data filed at 09/18/2018 1314 Gross per 24 hour  Intake 575 ml  Output 3200 ml  Net -2625 ml   Filed Weights   09/14/18 0938 09/17/18 0500 09/17/18 2146  Weight: 67.6 kg 59 kg 65.3 kg   Weight change: 6.35 kg Body mass index is 23.96 kg/m.   Physical Exam: General exam: Appears calm and comfortable.  Skin: No rashes, lesions or ulcers. HEENT: Atraumatic, normocephalic, supple neck, no obvious bleeding Lungs: Clear to auscultation bilaterally CVS: Regular rate and rhythm, no murmur GI/Abd soft, nontender, nondistended bowel sound present.  Suprapubic catheter site intact. CNS: Alert, awake, oriented x3 Psychiatry: Mood appropriate Extremities: No pedal edema, no calf tenderness  Data Review: I have personally reviewed the laboratory data and studies available.  Recent Labs  Lab 09/13/18 1503 09/14/18 0949 09/15/18 0455 09/17/18 0642 09/18/18 0525  WBC 9.6 7.8 7.4 6.5 5.7  NEUTROABS  --  5.7  --  4.5 3.8  HGB 9.6* 8.6* 8.3* 8.9* 9.4*  HCT 28.4* 26.3* 25.0* 26.7* 28.9*  MCV 90 92.6 92.6 92.7 92.6  PLT 177 147* 148* 169 174   Recent Labs  Lab 09/14/18 0949 09/15/18 0455 09/16/18 1231 09/17/18 0642 09/18/18 0525  NA 134* 139 140 140 140  K 4.5 4.3 4.0 3.7 3.9  CL 103 109 108 108 107  CO2 18* 18* 19* 22 23  GLUCOSE 122* 133* 153* 120* 121*  BUN 76* 72* 59* 51* 50*  CREATININE 5.51* 5.26* 4.51* 4.11* 3.51*   CALCIUM 9.2 9.0 9.1 9.0 9.3  MG  --   --   --  2.0  --     Terrilee Croak, MD  [facility] 09/18/2018

## 2018-09-18 NOTE — Progress Notes (Signed)
Subjective: Pt sleeping comfortably.  UOP remains excellent.  Cr is down to 3.51.  SP tube changed.  UTI now being treated with Levaquin per sensitivities.  ROS:  Review of Systems  Unable to perform ROS: Other  Pt sleeping.   Anti-infectives: Anti-infectives (From admission, onward)   Start     Dose/Rate Route Frequency Ordered Stop   09/17/18 1330  levofloxacin (LEVAQUIN) tablet 250 mg     250 mg Oral Every 48 hours 09/17/18 1319     09/14/18 1500  cefTRIAXone (ROCEPHIN) 1 g in sodium chloride 0.9 % 100 mL IVPB  Status:  Discontinued     1 g 200 mL/hr over 30 Minutes Intravenous Every 24 hours 09/14/18 1410 09/17/18 1317   09/14/18 1415  cefTRIAXone (ROCEPHIN) injection 1 g  Status:  Discontinued     1 g Intramuscular Every 24 hours 09/14/18 1407 09/14/18 1410      Current Facility-Administered Medications  Medication Dose Route Frequency Provider Last Rate Last Dose  . acetaminophen (TYLENOL) tablet 650 mg  650 mg Oral Q6H PRN Merton Border, MD   650 mg at 09/17/18 1706   Or  . acetaminophen (TYLENOL) suppository 650 mg  650 mg Rectal Q6H PRN Merton Border, MD      . apixaban Arne Cleveland) tablet 2.5 mg  2.5 mg Oral BID Dahal, Marlowe Aschoff, MD   2.5 mg at 09/17/18 1706  . atorvastatin (LIPITOR) tablet 40 mg  40 mg Oral QPM Merton Border, MD   40 mg at 09/17/18 1702  . bisacodyl (DULCOLAX) EC tablet 5 mg  5 mg Oral Daily PRN Terrilee Croak, MD   5 mg at 09/17/18 1501  . isosorbide mononitrate (IMDUR) 24 hr tablet 15 mg  15 mg Oral Daily Merton Border, MD   15 mg at 09/17/18 1003  . levofloxacin (LEVAQUIN) tablet 250 mg  250 mg Oral Q48H Dahal, Binaya, MD   250 mg at 09/17/18 1500  . meclizine (ANTIVERT) tablet 12.5 mg  12.5 mg Oral TID PRN Merton Border, MD      . multivitamin with minerals tablet 1 tablet  1 tablet Oral Daily Merton Border, MD   1 tablet at 09/17/18 1003  . nitroGLYCERIN (NITROSTAT) SL tablet 0.4 mg  0.4 mg Sublingual Q5 min PRN Merton Border, MD      . ondansetron (ZOFRAN-ODT)  disintegrating tablet 4 mg  4 mg Oral Q8H PRN Merton Border, MD      . pantoprazole (PROTONIX) EC tablet 40 mg  40 mg Oral BID Terrilee Croak, MD   40 mg at 09/17/18 2202  . polyvinyl alcohol (LIQUIFILM TEARS) 1.4 % ophthalmic solution 1 drop  1 drop Both Eyes TID Merton Border, MD   1 drop at 09/17/18 2203  . senna-docusate (Senokot-S) tablet 1 tablet  1 tablet Oral QHS Terrilee Croak, MD   1 tablet at 09/17/18 2202  . sodium bicarbonate tablet 650 mg  650 mg Oral BID Finnigan, Nancy A, DO   650 mg at 09/17/18 2202     Objective: Vital signs in last 24 hours: Temp:  [97.7 F (36.5 C)-97.9 F (36.6 C)] 97.9 F (36.6 C) (06/23 0423) Pulse Rate:  [75-79] 76 (06/23 0423) Resp:  [17-18] 18 (06/23 0423) BP: (126-157)/(69-81) 143/79 (06/23 0423) SpO2:  [98 %-100 %] 99 % (06/23 0423) Weight:  [65.3 kg] 65.3 kg (06/22 2146)  Intake/Output from previous day: 06/22 0701 - 06/23 0700 In: 440 [P.O.:440] Out: 2150 [Urine:2150] Intake/Output this shift: Total I/O In: 0  Out: 800 [Urine:800]   Physical Exam Vitals signs reviewed.  Constitutional:      Appearance: Normal appearance.     Comments: Sleeping comfortably.     Lab Results:  Recent Labs    09/17/18 0642 09/18/18 0525  WBC 6.5 5.7  HGB 8.9* 9.4*  HCT 26.7* 28.9*  PLT 169 174   BMET Recent Labs    09/17/18 0642 09/18/18 0525  NA 140 140  K 3.7 3.9  CL 108 107  CO2 22 23  GLUCOSE 120* 121*  BUN 51* 50*  CREATININE 4.11* 3.51*  CALCIUM 9.0 9.3   PT/INR No results for input(s): LABPROT, INR in the last 72 hours. ABG No results for input(s): PHART, HCO3 in the last 72 hours.  Invalid input(s): PCO2, PO2  Studies/Results: No results found.   Assessment and Plan:  Bilateral hydro with acute on chronic renal insufficiency and history of outlet obstruction with chronic SP tube which was changed yesterday.   He continues to have excellent urine output.  His Cr has declined further to 3.51.   UTI with  Stenotrophomonas maltophilia sens to Levaquin and TMP/SMZ.   Currently on Levaquin.       LOS: 4 days    Irine Seal 09/18/2018 [phone]

## 2018-09-19 LAB — CBC WITH DIFFERENTIAL/PLATELET
Abs Immature Granulocytes: 0.02 10*3/uL (ref 0.00–0.07)
Basophils Absolute: 0 10*3/uL (ref 0.0–0.1)
Basophils Relative: 0 %
Eosinophils Absolute: 0.2 10*3/uL (ref 0.0–0.5)
Eosinophils Relative: 3 %
HCT: 27.6 % — ABNORMAL LOW (ref 39.0–52.0)
Hemoglobin: 9.2 g/dL — ABNORMAL LOW (ref 13.0–17.0)
Immature Granulocytes: 0 %
Lymphocytes Relative: 19 %
Lymphs Abs: 1.2 10*3/uL (ref 0.7–4.0)
MCH: 30.9 pg (ref 26.0–34.0)
MCHC: 33.3 g/dL (ref 30.0–36.0)
MCV: 92.6 fL (ref 80.0–100.0)
Monocytes Absolute: 0.7 10*3/uL (ref 0.1–1.0)
Monocytes Relative: 12 %
Neutro Abs: 4.1 10*3/uL (ref 1.7–7.7)
Neutrophils Relative %: 66 %
Platelets: 173 10*3/uL (ref 150–400)
RBC: 2.98 MIL/uL — ABNORMAL LOW (ref 4.22–5.81)
RDW: 14.2 % (ref 11.5–15.5)
WBC: 6.2 10*3/uL (ref 4.0–10.5)
nRBC: 0 % (ref 0.0–0.2)

## 2018-09-19 LAB — BASIC METABOLIC PANEL
Anion gap: 10 (ref 5–15)
BUN: 44 mg/dL — ABNORMAL HIGH (ref 8–23)
CO2: 21 mmol/L — ABNORMAL LOW (ref 22–32)
Calcium: 8.9 mg/dL (ref 8.9–10.3)
Chloride: 110 mmol/L (ref 98–111)
Creatinine, Ser: 3.2 mg/dL — ABNORMAL HIGH (ref 0.61–1.24)
GFR calc Af Amer: 19 mL/min — ABNORMAL LOW (ref >60)
GFR calc non Af Amer: 16 mL/min — ABNORMAL LOW (ref >60)
Glucose, Bld: 111 mg/dL — ABNORMAL HIGH (ref 70–99)
Potassium: 3.1 mmol/L — ABNORMAL LOW (ref 3.5–5.1)
Sodium: 141 mmol/L (ref 135–145)

## 2018-09-19 MED ORDER — POTASSIUM CHLORIDE CRYS ER 20 MEQ PO TBCR
40.0000 meq | EXTENDED_RELEASE_TABLET | Freq: Once | ORAL | Status: AC
  Administered 2018-09-19: 40 meq via ORAL
  Filled 2018-09-19: qty 2

## 2018-09-19 MED ORDER — SODIUM CHLORIDE 0.9 % IV SOLN
INTRAVENOUS | Status: DC
  Administered 2018-09-19 – 2018-09-20 (×2): via INTRAVENOUS

## 2018-09-19 NOTE — Progress Notes (Addendum)
Ronald [name]    NEPHROLOGY PROGRESS NOTE  SUBJECTIVE: Pt with 4.4 liters  UOP over 6/23.  He had initially wanted to go home today but his wife is encouraging him to stay today; they are worried about risk of readmission.  He is confused as to why he needs to follow-up with nephrology as well as urology.    Review of systems:  Denies shortness of breath  Denies chest pain Denies n/v    OBJECTIVE:  Vitals:   09/19/18 0500 09/19/18 0846  BP: 134/62 129/75  Pulse: 74 79  Resp: 19 18  Temp: 98.4 F (36.9 C) 97.8 F (36.6 C)  SpO2: 99% 100%    Intake/Output Summary (Last 24 hours) at 09/19/2018 1429 Last data filed at 09/19/2018 1400 Gross per 24 hour  Intake 2400.05 ml  Output 3150 ml  Net -749.95 ml      General:  AAOx3 NAD HEENT: MMM [place] AT anicteric sclera. Hard of hearing  Neck:  No JVD, no adenopathy CV:  Heart RRR  Lungs:  L/S CTA bilaterally Abd:  abd SNT/ND GU:  Bladder non-palpable, positive suprapubic catheter Extremities:  No LE edema. Skin:  No skin rash  MEDICATIONS:  . apixaban  2.5 mg Oral BID  . atorvastatin  40 mg Oral QPM  . isosorbide mononitrate  15 mg Oral Daily  . levofloxacin  250 mg Oral Q48H  . multivitamin with minerals  1 tablet Oral Daily  . pantoprazole  40 mg Oral BID  . polyvinyl alcohol  1 drop Both Eyes TID  . senna-docusate  1 tablet Oral QHS       LABS:   CBC Latest Ref Rng & Units 09/19/2018 09/18/2018 09/17/2018  WBC 4.0 - 10.5 K/uL 6.2 5.7 6.5  Hemoglobin 13.0 - 17.0 g/dL 9.2(L) 9.4(L) 8.9(L)  Hematocrit 39.0 - 52.0 % 27.6(L) 28.9(L) 26.7(L)  Platelets 150 - 400 K/uL 173 174 169    CMP Latest Ref Rng & Units 09/19/2018 09/18/2018 09/17/2018  Glucose 70 - 99 mg/dL 111(H) 121(H) 120(H)  BUN 8 - 23 mg/dL 44(H) 50(H) 51(H)  Creatinine 0.61 - 1.24 mg/dL 3.20(H) 3.51(H) 4.11(H)  Sodium 135 - 145 mmol/L 141 140 140  Potassium 3.5 - 5.1 mmol/L 3.1(L) 3.9 3.7  Chloride 98 - 111 mmol/L 110 107 108  CO2 22 - 32  mmol/L 21(L) 23 22  Calcium 8.9 - 10.3 mg/dL 8.9 9.3 9.0  Total Protein 6.5 - 8.1 g/dL - - -  Total Bilirubin 0.3 - 1.2 mg/dL - - -  Alkaline Phos 38 - 126 U/L - - -  AST 15 - 41 U/L - - -  ALT 0 - 44 U/L - - -    Lab Results  Component Value Date   CALCIUM 8.9 09/19/2018   CAION 1.24 12/26/2017   PHOS 3.3 01/20/2016       Component Value Date/Time   COLORURINE YELLOW 09/14/2018 1003   APPEARANCEUR HAZY (A) 09/14/2018 1003   LABSPEC 1.004 (L) 09/14/2018 1003   PHURINE 6.0 09/14/2018 1003   GLUCOSEU NEGATIVE 09/14/2018 1003   HGBUR LARGE (A) 09/14/2018 1003   BILIRUBINUR NEGATIVE 09/14/2018 1003   BILIRUBINUR neg 06/30/2014 1530   KETONESUR NEGATIVE 09/14/2018 1003   PROTEINUR NEGATIVE 09/14/2018 1003   UROBILINOGEN 0.2 12/04/2014 2344   NITRITE NEGATIVE 09/14/2018 1003   LEUKOCYTESUR LARGE (A) 09/14/2018 1003      Component Value Date/Time   PHART 7.417 01/14/2016 1219   PCO2ART 24.7 (L) 01/14/2016 1219  PO2ART 50.0 (L) 01/14/2016 1219   HCO3 16.0 (L) 01/14/2016 1219   TCO2 21 (L) 12/26/2017 0001   ACIDBASEDEF 7.0 (H) 01/14/2016 1219   O2SAT 87.0 01/14/2016 1219    No results found for: IRON, TIBC, FERRITIN, IRONPCTSAT     ASSESSMENT/PLAN:    The patient is a 85 y.o. year old male with a past medical history significant for CAD status post CABG, GERD, chronic kidney disease, prostate cancer with suprapubic catheter he was evaluated by his cardiologist and was found to have a worsening creatinine of 5.3.  He complained of associated weakness and increased shortness of breath.  He was sent to the emergency department for further evaluation.  Review of records reveals a creatinine of 3.29 on 09/01/2018, and a creatinine of 5.51 on 09/14/2018.  Previously, he had a serum creatinine of 1.1-1.5 in March 2020.  He denies any interval health issues.    1.  Acute kidney injury.  Nonoliguric and resolving.  Urinalysis is without proteinuria, but large blood is noted.  hx  obstructive uropathy.  His most recent episode of acute kidney injury during his admission on 08/30/2018 was attributed to a suprapubic catheter that was blocked for at least 3 days.  That was exchanged on 6/2, with improvement in his renal function.  Continue to flush suprapubic catheter regularly.  Renal ultrasound revealed bilateral hydronephrosis, moderately severe on the right and moderate on the left.  Urology following closely and chronic SP tube was changed 6/21 per their note.   - Resume IV fluids and would plan for discharge tomorrow from a renal standpoint.   - S/p potassium repletion.   - He will need to follow-up with nephrology (can see me) in clinic 7-10 days after discharge.  I have discussed with him and his other providers have as well but he is confused as to why he needs the follow-up.  Spoke with his wife and she voiced understanding.   2.  Chronic kidney disease stage III with a baseline serum creatinine around 1.1-1.5.  3.  Suprapubic catheter.  As above.  Urology to manage  4.  Anemia.  Trend hemoglobin.  stable; no acute indication for PRBC's  5.  Metabolic acidosis.  Likely secondary to obstructive uropathy.  Improving.   Will discontinue sodium bicarbonate and monitor.

## 2018-09-19 NOTE — Plan of Care (Signed)
  Problem: Education: Goal: Knowledge of General Education information will improve Description: Including pain rating scale, medication(s)/side effects and non-pharmacologic comfort measures Outcome: Progressing   Problem: Health Behavior/Discharge Planning: Goal: Ability to manage health-related needs will improve Outcome: Progressing   Problem: Clinical Measurements: Goal: Ability to maintain clinical measurements within normal limits will improve Outcome: Progressing Goal: Will remain free from infection Outcome: Progressing Goal: Diagnostic test results will improve Outcome: Progressing Goal: Respiratory complications will improve Outcome: Progressing Goal: Cardiovascular complication will be avoided Outcome: Progressing   Problem: Activity: Goal: Risk for activity intolerance will decrease Outcome: Progressing   Problem: Nutrition: Goal: Adequate nutrition will be maintained Outcome: Progressing   Problem: Coping: Goal: Level of anxiety will decrease Outcome: Progressing   Problem: Elimination: Goal: Will not experience complications related to bowel motility Outcome: Progressing Goal: Will not experience complications related to urinary retention Outcome: Progressing   Problem: Safety: Goal: Ability to remain free from injury will improve Outcome: Progressing   Problem: Skin Integrity: Goal: Risk for impaired skin integrity will decrease Outcome: Progressing   Problem: Urinary Elimination: Goal: Signs and symptoms of infection will decrease Outcome: Progressing

## 2018-09-19 NOTE — Progress Notes (Signed)
PROGRESS NOTE  DENSON NICCOLI MWN:[id number] DOB: 1927-07-06 DOA: 09/14/2018 PCP: Isaac Bliss, [name], MD   LOS: 5 days   Brief narrative: The patient is a89 y.o.year old malewith a past medical history significant for CAD status post CABG, ischemic cardiomyopathy, A. fib on Eliquis,chronic kidney disease, prostate cancer with suprapubic catheter he was evaluated by his cardiologiston 6/18for shortness of breath and weakness. Hewas found to have a worsening creatinine of 5.3. He was sent to the EDfor further evaluation.   In the ED, he was also noted to have fecal occult blood test positive. Was also noted to have leakage from around the suprapubic catheter site. CT obtained in the ER shows new bilateral hydronephrosis to the level of the UVJ and a decompress thick walled bladder.  He was admitted under hospitalist service. Nephrology GI and urology services were consulted. Since admission, Lasix has been on hold.  He is also receiving IV fluid.  Creatinine gradually improving, 3.2 today.  Subjective: Patient was seen and examined this morning.  Pleasant elderly male.  Sitting up in chair.  Not in distress.  His creatinine is gradually improving.  He is however frustrated because of prolonged stay.  He initially wanted to go home today.  His wife called him and convinced him to stay.  Assessment/Plan:  Active Problems:   Acute renal failure (ARF) (HCC)   Anemia due to chronic kidney disease  Acute kidney injury on CKD stage III -baseline serum creatinine around 1.1-1.5. -Creatinine on admission was elevated to 5.35.Likely postrenal due to bilateral hydronephrosis as seen on CT scan.  Suprapubic catheter manipulated by urology.  Lasix was also held on admission.  Making significant urine output and steady improvement in creatinine.  Creatinine down to 3.2 today.  -No proteinuria in urinalysis. -Sodium bicarbonate for acidosis.  Chronic bladder obstruction status  post suprapubic catheter -In the ED, he was noted to have some leakage from around his suprapubic catheter.Renal ultrasound revealed bilateral hydronephrosis, moderately severe on the right and moderate on the left. -Urologywasconsulted.Suprapubic catheter was changed on 6/22.  UTI -More than 100,000 CFU per mL of stenotrophomonas ultimately isolated,sensitive to Levaquin. Currently on Levaquin oral.  Anemia.  -No obvious melena, hematochezia or hemodynamic instability. GI was consulted. Did not recommend any further endoscopy intervention. Continue PPI twice daily. Continue regular diet.  Follow-up as an outpatient.  A. Fib -on Eliquis.Was initially held because of GI bleed. Hemoglobin is stable. No plan of intervention. Resumed Eliquis.  CAD status post CABG -Prior to admission, patient was on statin, Imdur which we will continue.  Ischemic cardiomyopathy -History of systolic heart failure but EF improved to 40-45% per echo from 08/29/2018. Was on Lasix 20 mg daily at home. Currently on hold because of AKI.  Constipation -resolved Dulcolax suppository.    Mobility:Encourage ambulation Diet:Cardiac diet DVT prophylaxis:SCDs Code Status:Code Status: Full Code Family Communication: Expected Discharge:If creatinine continues to improve, patient should be clear for discharge home tomorrow per nephrology.  Antimicrobials:  Anti-infectives (From admission, onward)   Start     Dose/Rate Route Frequency Ordered Stop   09/17/18 1330  levofloxacin (LEVAQUIN) tablet 250 mg     250 mg Oral Every 48 hours 09/17/18 1319     09/14/18 1500  cefTRIAXone (ROCEPHIN) 1 g in sodium chloride 0.9 % 100 mL IVPB  Status:  Discontinued     1 g 200 mL/hr over 30 Minutes Intravenous Every 24 hours 09/14/18 1410 09/17/18 1317   09/14/18 1415  cefTRIAXone (ROCEPHIN)  injection 1 g  Status:  Discontinued     1 g Intramuscular Every 24 hours 09/14/18 1407 09/14/18 1410       Infusions:  . sodium chloride 20 mL/hr at 09/19/18 1452    Scheduled Meds: . apixaban  2.5 mg Oral BID  . atorvastatin  40 mg Oral QPM  . isosorbide mononitrate  15 mg Oral Daily  . levofloxacin  250 mg Oral Q48H  . multivitamin with minerals  1 tablet Oral Daily  . pantoprazole  40 mg Oral BID  . polyvinyl alcohol  1 drop Both Eyes TID  . senna-docusate  1 tablet Oral QHS    PRN meds: acetaminophen **OR** acetaminophen, bisacodyl, meclizine, nitroGLYCERIN, ondansetron   Objective: Vitals:   09/19/18 0500 09/19/18 0846  BP: 134/62 129/75  Pulse: 74 79  Resp: 19 18  Temp: 98.4 F (36.9 C) 97.8 F (36.6 C)  SpO2: 99% 100%    Intake/Output Summary (Last 24 hours) at 09/19/2018 1609 Last data filed at 09/19/2018 1500 Gross per 24 hour  Intake 2580.05 ml  Output 3150 ml  Net -569.95 ml   Filed Weights   09/17/18 0500 09/17/18 2146 09/18/18 2023  Weight: 59 kg 65.3 kg 64.6 kg   Weight change: -0.726 kg Body mass index is 23.7 kg/m.   Physical Exam: General exam: Appears calm and comfortable.  Skin: No rashes, lesions or ulcers. HEENT: Atraumatic, normocephalic, supple neck, no obvious bleeding Lungs: Clear to auscultation bilaterally CVS: Regular rate and rhythm, no murmur GI/Abd soft, nontender, nondistended, bowel sound present CNS: Alert, awake, oriented x3, very hard of hearing Psychiatry: Mood appropriate Extremities: No pedal edema, no calf tenderness  Data Review: I have personally reviewed the laboratory data and studies available.  Recent Labs  Lab 09/14/18 0949 09/15/18 0455 09/17/18 0642 09/18/18 0525 09/19/18 0641  WBC 7.8 7.4 6.5 5.7 6.2  NEUTROABS 5.7  --  4.5 3.8 4.1  HGB 8.6* 8.3* 8.9* 9.4* 9.2*  HCT 26.3* 25.0* 26.7* 28.9* 27.6*  MCV 92.6 92.6 92.7 92.6 92.6  PLT 147* 148* 169 174 173   Recent Labs  Lab 09/15/18 0455 09/16/18 1231 09/17/18 0642 09/18/18 0525 09/19/18 0641  NA 139 140 140 140 141  K 4.3 4.0 3.7 3.9 3.1*  CL 109  108 108 107 110  CO2 18* 19* 22 23 21*  GLUCOSE 133* 153* 120* 121* 111*  BUN 72* 59* 51* 50* 44*  CREATININE 5.26* 4.51* 4.11* 3.51* 3.20*  CALCIUM 9.0 9.1 9.0 9.3 8.9  MG  --   --  2.0  --   --     Terrilee Croak, MD  [facility] 09/19/2018

## 2018-09-20 ENCOUNTER — Ambulatory Visit: Payer: Medicare Other | Admitting: Physician Assistant

## 2018-09-20 DIAGNOSIS — I2510 Atherosclerotic heart disease of native coronary artery without angina pectoris: Secondary | ICD-10-CM

## 2018-09-20 DIAGNOSIS — N183 Chronic kidney disease, stage 3 (moderate): Secondary | ICD-10-CM

## 2018-09-20 DIAGNOSIS — I5022 Chronic systolic (congestive) heart failure: Secondary | ICD-10-CM

## 2018-09-20 DIAGNOSIS — E872 Acidosis: Secondary | ICD-10-CM

## 2018-09-20 DIAGNOSIS — R195 Other fecal abnormalities: Secondary | ICD-10-CM

## 2018-09-20 DIAGNOSIS — D631 Anemia in chronic kidney disease: Secondary | ICD-10-CM

## 2018-09-20 LAB — CBC WITH DIFFERENTIAL/PLATELET
Abs Immature Granulocytes: 0.02 10*3/uL (ref 0.00–0.07)
Basophils Absolute: 0 10*3/uL (ref 0.0–0.1)
Basophils Relative: 0 %
Eosinophils Absolute: 0.2 10*3/uL (ref 0.0–0.5)
Eosinophils Relative: 4 %
HCT: 26 % — ABNORMAL LOW (ref 39.0–52.0)
Hemoglobin: 8.7 g/dL — ABNORMAL LOW (ref 13.0–17.0)
Immature Granulocytes: 0 %
Lymphocytes Relative: 15 %
Lymphs Abs: 0.9 10*3/uL (ref 0.7–4.0)
MCH: 31.2 pg (ref 26.0–34.0)
MCHC: 33.5 g/dL (ref 30.0–36.0)
MCV: 93.2 fL (ref 80.0–100.0)
Monocytes Absolute: 0.7 10*3/uL (ref 0.1–1.0)
Monocytes Relative: 12 %
Neutro Abs: 4 10*3/uL (ref 1.7–7.7)
Neutrophils Relative %: 69 %
Platelets: 157 10*3/uL (ref 150–400)
RBC: 2.79 MIL/uL — ABNORMAL LOW (ref 4.22–5.81)
RDW: 14.3 % (ref 11.5–15.5)
WBC: 5.9 10*3/uL (ref 4.0–10.5)
nRBC: 0 % (ref 0.0–0.2)

## 2018-09-20 LAB — BASIC METABOLIC PANEL
Anion gap: 8 (ref 5–15)
BUN: 39 mg/dL — ABNORMAL HIGH (ref 8–23)
CO2: 21 mmol/L — ABNORMAL LOW (ref 22–32)
Calcium: 8.8 mg/dL — ABNORMAL LOW (ref 8.9–10.3)
Chloride: 112 mmol/L — ABNORMAL HIGH (ref 98–111)
Creatinine, Ser: 2.88 mg/dL — ABNORMAL HIGH (ref 0.61–1.24)
GFR calc Af Amer: 21 mL/min — ABNORMAL LOW (ref >60)
GFR calc non Af Amer: 18 mL/min — ABNORMAL LOW (ref >60)
Glucose, Bld: 120 mg/dL — ABNORMAL HIGH (ref 70–99)
Potassium: 4.3 mmol/L (ref 3.5–5.1)
Sodium: 141 mmol/L (ref 135–145)

## 2018-09-20 MED ORDER — FUROSEMIDE 20 MG PO TABS: 20.0000 mg | ORAL_TABLET | Freq: Every day | ORAL | 2 refills | Status: DC | PRN

## 2018-09-20 MED ORDER — LEVOFLOXACIN 250 MG PO TABS: 250.0000 mg | ORAL_TABLET | ORAL | 0 refills | Status: AC

## 2018-09-20 MED ORDER — DARBEPOETIN ALFA 40 MCG/0.4ML IJ SOSY
40.0000 ug | PREFILLED_SYRINGE | Freq: Once | INTRAMUSCULAR | Status: DC
  Filled 2018-09-20: qty 0.4

## 2018-09-20 NOTE — Care Management Important Message (Signed)
Important Message  Patient Details  Name: Ronald [name] MRN: [id number] Date of Birth: Dec 07, 1927   Medicare Important Message Given:  Yes     [name] 09/20/2018, 4:25 PM

## 2018-09-20 NOTE — Progress Notes (Signed)
[facility]    NEPHROLOGY PROGRESS NOTE  SUBJECTIVE: Pt with 3.2 liters  UOP over 6/24.  He states that he is feeling better today.  He has received normal saline at 75 ml/hr.     Review of systems:  Denies shortness of breath  Denies chest pain Denies n/v    OBJECTIVE:  Vitals:   09/20/18 0436 09/20/18 0934  BP: (!) 144/82 (!) 153/84  Pulse: 73 75  Resp: 16 19  Temp: 98.1 F (36.7 C) (!) 97.5 F (36.4 C)  SpO2: 98% 97%    Intake/Output Summary (Last 24 hours) at 09/20/2018 1223 Last data filed at 09/20/2018 1038 Gross per 24 hour  Intake 2375.17 ml  Output 3450 ml  Net -1074.83 ml      General:  AAOx3 NAD HEENT: MMM Ronald [name] AT anicteric sclera. Hard of hearing  Neck:  No JVD, no adenopathy CV:  Heart RRR  Lungs:  L/S CTA bilaterally Abd:  abd SNT/ND GU:  Bladder non-palpable, positive suprapubic catheter Extremities:  No LE edema. Skin:  No skin rash  MEDICATIONS:  . apixaban  2.5 mg Oral BID  . atorvastatin  40 mg Oral QPM  . isosorbide mononitrate  15 mg Oral Daily  . levofloxacin  250 mg Oral Q48H  . multivitamin with minerals  1 tablet Oral Daily  . pantoprazole  40 mg Oral BID  . polyvinyl alcohol  1 drop Both Eyes TID  . senna-docusate  1 tablet Oral QHS       LABS:   CBC Latest Ref Rng & Units 09/20/2018 09/19/2018 09/18/2018  WBC 4.0 - 10.5 K/uL 5.9 6.2 5.7  Hemoglobin 13.0 - 17.0 g/dL 8.7(L) 9.2(L) 9.4(L)  Hematocrit 39.0 - 52.0 % 26.0(L) 27.6(L) 28.9(L)  Platelets 150 - 400 K/uL 157 173 174    CMP Latest Ref Rng & Units 09/20/2018 09/19/2018 09/18/2018  Glucose 70 - 99 mg/dL 120(H) 111(H) 121(H)  BUN 8 - 23 mg/dL 39(H) 44(H) 50(H)  Creatinine 0.61 - 1.24 mg/dL 2.88(H) 3.20(H) 3.51(H)  Sodium 135 - 145 mmol/L 141 141 140  Potassium 3.5 - 5.1 mmol/L 4.3 3.1(L) 3.9  Chloride 98 - 111 mmol/L 112(H) 110 107  CO2 22 - 32 mmol/L 21(L) 21(L) 23  Calcium 8.9 - 10.3 mg/dL 8.8(L) 8.9 9.3  Total Protein 6.5 - 8.1 g/dL - - -  Total  Bilirubin 0.3 - 1.2 mg/dL - - -  Alkaline Phos 38 - 126 U/L - - -  AST 15 - 41 U/L - - -  ALT 0 - 44 U/L - - -    Lab Results  Component Value Date   CALCIUM 8.8 (L) 09/20/2018   CAION 1.24 12/26/2017   PHOS 3.3 01/20/2016       Component Value Date/Time   COLORURINE YELLOW 09/14/2018 1003   APPEARANCEUR HAZY (A) 09/14/2018 1003   LABSPEC 1.004 (L) 09/14/2018 1003   PHURINE 6.0 09/14/2018 1003   GLUCOSEU NEGATIVE 09/14/2018 1003   HGBUR LARGE (A) 09/14/2018 1003   BILIRUBINUR NEGATIVE 09/14/2018 1003   BILIRUBINUR neg 06/30/2014 1530   KETONESUR NEGATIVE 09/14/2018 1003   PROTEINUR NEGATIVE 09/14/2018 1003   UROBILINOGEN 0.2 12/04/2014 2344   NITRITE NEGATIVE 09/14/2018 1003   LEUKOCYTESUR LARGE (A) 09/14/2018 1003      Component Value Date/Time   PHART 7.417 01/14/2016 1219   PCO2ART 24.7 (L) 01/14/2016 1219   PO2ART 50.0 (L) 01/14/2016 1219   HCO3 16.0 (L) 01/14/2016 1219   TCO2 21 (L) 12/26/2017  0001   ACIDBASEDEF 7.0 (H) 01/14/2016 1219   O2SAT 87.0 01/14/2016 1219    No results found for: IRON, TIBC, FERRITIN, IRONPCTSAT     ASSESSMENT/PLAN:    The patient is a 85 y.o. year old male with a past medical history significant for CAD status post CABG, GERD, chronic kidney disease, prostate cancer with suprapubic catheter he was evaluated by his cardiologist and was found to have a worsening creatinine of 5.3.  He complained of associated weakness and increased shortness of breath.  He was sent to the emergency department for further evaluation.  Review of records reveals a creatinine of 3.29 on 09/01/2018, and a creatinine of 5.51 on 09/14/2018.  Previously, he had a serum creatinine of 1.1-1.5 in March 2020.  He denies any interval health issues.    1.  Acute kidney injury.  Nonoliguric and resolving.  Obstructive uropathy.  Urinalysis is without proteinuria, but large blood is noted.  His most recent episode of acute kidney injury during his admission on 08/30/2018 was  attributed to a suprapubic catheter that was blocked for at least 3 days.  That was exchanged on 6/2, with improvement in his renal function.  Renal ultrasound revealed bilateral hydronephrosis, moderately severe on the right and moderate on the left.  Urology following closely and chronic SP tube was changed 6/21 per their note.   - Resolving with supportive care and SP tube exchange   2.  Chronic kidney disease stage III with a baseline serum creatinine around 1.1-1.5.  3.  Suprapubic catheter.  As above.  Urology to manage  4.  Anemia.  stable; no acute indication for PRBC's. Aranesp 40 mcg once.  Further work-up per primary team.  Fecal occult blood was positive with no overt melena.  GI was consulted and did not recommend intervention.    5.  Metabolic acidosis.  Secondary to AKI and obstructive uropathy.  Improving.   Will discontinue sodium bicarbonate and monitor.   6. Ischemic cardiomyopathy with EF of 40-45% which was felt to be possible underestimation.  Euvolemic on exam and has gotten IV fluids here.  Discontinuing fluids.  Has been off of his lasix which is reported as 20 mg daily.  Ok to resume lasix at 20 mg daily as needed for swelling or shortness of breath on discharge    Stable for discharge from a renal standpoint.  He will need to follow-up with nephrology (can see me) in clinic 7-10 days after discharge.  I have discussed with him and his other providers have as well but he is confused as to why he needs the follow-up.  Spoke with his wife and she voiced understanding.   Renal will sign off.   Ronald [name] 09/20/2018 12:27 PM

## 2018-09-20 NOTE — Progress Notes (Signed)
DISCHARGE NOTE  Ronald [name] to be discharged Home per MD order. Patient verbalized understanding.  Skin clean, dry and intact without evidence of skin break down, no evidence of skin tears noted. IV catheter discontinued intact. Site without signs and symptoms of complications. Dressing and pressure applied. Pt denies pain at the site currently. No complaints noted. Patient is going home with suprapubic catheter attached to leg bag drainage, draining well.  Discharge packet assembled. An After Visit Summary (AVS) was printed and given to the patient. Patient escorted via wheelchair and discharged to home via private auto.  Babs Sciara, RN

## 2018-09-20 NOTE — Consult Note (Signed)
   [facility] Inpatient Consult   09/20/2018  Ronald [name] 06/04/1927 [id number]   Patient screened for extreme [31%] high risk score for unplanned readmission score  anda less than 30 day re-hospitalizations.  Patient has Medicare and in the [facility] with [facility].  Patient has a distant history with [facility] Care management in the past.   Review of patient's medical record reveals from Dr. Terrilee Croak notes as follows:  The patient is a89 y.o.year old malewith a past medical history significant for CAD status post CABG, ischemic cardiomyopathy, A. fib on Eliquis,chronic kidney disease, prostate cancer with suprapubic catheter he was evaluated by his cardiologiston 6/18for shortness of breath and weakness. Hewas found to have a worsening creatinine of 5.3.   Primary Care Provider is  Isaac Bliss, [name], MD of [facility], this office is listed to provide the post hospital transition of care follow up.  Plan:  Follow up with inpatient TOC team Continue to follow progress and disposition to assess for post hospital care management needs.   Please place a [facility] Care Management consult as appropriate and for questions contact:   Natividad Brood, RN BSN [facility] Liaison  [phone] business mobile phone Toll free office [phone]  Fax number: [phone] [email or web address]

## 2018-09-20 NOTE — Discharge Summary (Signed)
Physician Discharge Summary  Ronald [name] SWH:[id number] DOB: [birth year] DOA: 09/14/2018  PCP: Isaac Bliss, [name], MD  Admit date: 09/14/2018 Discharge date: 09/20/2018 Consultations: Urology Dr. Noah Delaine, Nephrology Dr. Maryjane Hurter, GI Dr. Therisa Doyne Admitted From: Home Disposition: Home  Discharge Diagnoses:  Active Problems:   Acute renal failure (ARF) (HCC)   Anemia due to chronic kidney disease Status post CABG Ischemic cardiomyopathy Chronic atrial fibrillation on Eliquis CKD Prostate cancer with bladder outlet obstruction status post suprapubic catheter   Brief/Interim Summary: 85 y.o.year old malewith a past medical history significant for CAD status post CABG, ischemic cardiomyopathy, A. fib on Eliquis,chronic kidney disease, prostate cancer with suprapubic catheter he was evaluated by his cardiologiston 6/18for shortness of breath and weakness. Hewas found to have a worsening creatinine of 5.3. He was sent to the EDfor further evaluation.  In the ED, he was also noted to have fecal occult blood test positive and also noted to have leakage around the suprapubic catheter site.CT abdomen/pelvis obtained in the ER showed new bilateral hydronephrosis to the level of the UVJ and a decompressed thick walled bladder.He was admitted under hospitalist service.Lasix was held. Nephrology GI and urology services were consulted.  Acute kidney injury on CKD stage III with metabolic acidosis: Likely a combination of prerenal and postrenal.  His baseline serum creatinine is around 1.1-1.5.Creatinine on  admission was elevated to 5.35 due dehydration in the setting of diuretics as well as obstructive uropathy from suprapubic catheter malfunction (for 3 days PTA) as evidenced by bilateral hydronephrosis seen on CT scan.  Suprapubic catheter was changed on June 21 by urology. Lasix was held on admission and patient started on IV hydration.  Patient was evaluated by nephrology-No  proteinuria in urinalysis.  He received potassium replacement and sodium bicarbonate for acidosis while hospitalized.  His serum creatinine improved to 2.8 today and cleared by nephrology for discharge with instructions to follow-up with them in 7 to 10 days.  He is advised to use Lasix only on an as-needed basis upon discharge.  This was explained to patient.  Metabolic acidosis has resolved and sodium bicarbonate supplementation has been discontinued by renal.  Catheter associated UTI: Urine culture grew more than 100,000 CFU.  Stenotrophomonas ultimately isolated,sensitive to Levaquin.  Patient receiving oral Levaquin every other day since June 23.  Will complete 7-day course as outpatient.  A. Fib: Patient on Eliquis at home which was initially held because of GI bleed. Hemoglobin remained stable and GI did not recommend any interventions.  Hence resumedEliquis.  CAD status post CABG: Resumed on home medications  History of chronic systolic CHF/ischemic cardiomyopathy: EF improved to 40-45% per echo from 08/29/2018. Was on Lasix 20 mg daily at home which was held during the hospital course secondary to AKI.  He is okay to resume Lasix on an as-needed basis upon discharge per renal.  He will need to follow-up PCP regarding further titration based on fluid status and renal function as outpatient.  Anemia:  Likely anemia of chronic disease.  His stool fecal occult blood was positive in the ED but no obvious melena, hematochezia or hemodynamic instability noted during the hospital course.[facility] GI was consulted. Did not recommend any further endoscopy intervention. Continue PPI twice daily. Continue regular diet.Follow-up as an outpatient.  Discharge Exam: Vitals:   09/20/18 0436 09/20/18 0934  BP: (!) 144/82 (!) 153/84  Pulse: 73 75  Resp: 16 19  Temp: 98.1 F (36.7 C) (!) 97.5 F (36.4 C)  SpO2: 98% 97%  Vitals:   09/19/18 2137 09/20/18 0300 09/20/18 0436 09/20/18 0934  BP: (!)  148/79  (!) 144/82 (!) 153/84  Pulse: 76  73 75  Resp: 20  16 19   Temp: 98.1 F (36.7 C)  98.1 F (36.7 C) (!) 97.5 F (36.4 C)  TempSrc: Oral  Oral Oral  SpO2: 100%  98% 97%  Weight:  68.6 kg    Height:        General: Pt is alert, awake, not in acute distress Cardiovascular: RRR, S1/S2 +, no rubs, no gallops Respiratory: CTA bilaterally, no wheezing, no rhonchi Abdominal: Soft, NT, ND, bowel sounds + Extremities: no edema, no cyanosis  Discharge Instructions  Discharge Instructions    (HEART FAILURE PATIENTS) Call MD:  Anytime you have any of the following symptoms: 1) 3 pound weight gain in 24 hours or 5 pounds in 1 week 2) shortness of breath, with or without a dry hacking cough 3) swelling in the hands, feet or stomach 4) if you have to sleep on extra pillows at night in order to breathe.   Complete by: As directed    Call MD for:  difficulty breathing, headache or visual disturbances   Complete by: As directed    Call MD for:  extreme fatigue   Complete by: As directed    Call MD for:  persistant dizziness or light-headedness   Complete by: As directed    Call MD for:  temperature >100.4   Complete by: As directed    Diet - low sodium heart healthy   Complete by: As directed    Increase activity slowly   Complete by: As directed      Allergies as of 09/20/2018      Reactions   Pantoprazole Other (See Comments)   Headache and lightheaded   Nitrofurantoin Hives   dizziness and lightheaded    Sulfa Antibiotics Hives, Itching   Lidocaine Swelling   Mouth swelling   Tape Other (See Comments)   TAPE WILL TEAR THE SKIN!!!      Medication List    STOP taking these medications   ranitidine 150 MG tablet Commonly known as: ZANTAC     TAKE these medications   acetaminophen 500 MG tablet Commonly known as: TYLENOL Take 500 mg by mouth every 6 (six) hours as needed for moderate pain or fever.   atorvastatin 80 MG tablet Commonly known as: LIPITOR Take 0.5  tablets (40 mg total) by mouth every evening. Notes to patient: This evening 09/20/18   Eliquis 5 MG Tabs tablet Generic drug: apixaban Take 2.5 mg by mouth 2 (two) times daily. Notes to patient: This evening 09/20/18   furosemide 20 MG tablet Commonly known as: LASIX Take 1 tablet (20 mg total) by mouth daily as needed for fluid or edema. What changed:   when to take this  reasons to take this   isosorbide mononitrate 30 MG 24 hr tablet Commonly known as: IMDUR Take 0.5 tablets (15 mg total) by mouth daily. Notes to patient: Tomorrow 09/21/18   levofloxacin 250 MG tablet Commonly known as: LEVAQUIN Take 1 tablet (250 mg total) by mouth every other day for 5 days. Start taking on: September 21, 2018 Notes to patient: See instruction. start tomorrow 09/21/18   meclizine 12.5 MG tablet Commonly known as: ANTIVERT Take 1 tablet (12.5 mg total) by mouth 3 (three) times daily as needed for dizziness.   multivitamin with minerals Tabs tablet Take 1 tablet by mouth daily. Notes to patient:  Tomorrow 09/21/18   nitroGLYCERIN 0.4 MG SL tablet Commonly known as: NITROSTAT Place 0.4 mg under the tongue every 5 (five) minutes as needed for chest pain.   omeprazole 40 MG capsule Commonly known as: PRILOSEC Take 1 capsule (40 mg total) by mouth daily. Notes to patient: Tomorrow 09/21/18   ondansetron 4 MG disintegrating tablet Commonly known as: Zofran ODT Take 1 tablet (4 mg total) by mouth every 8 (eight) hours as needed for nausea or vomiting.   Systane 0.4-0.3 % Soln Generic drug: Polyethyl Glycol-Propyl Glycol Apply 1 drop to eye 3 (three) times daily.   TUMS PO Take 3 tablets by mouth daily as needed (gas).      Follow-up Information    [facility] Follow up.   Why: Home Health Physical Therapy; someone will call for scheduling  [facility] Contact Information: [address] [place], [place] [address] [phone]          Allergies  Allergen  Reactions  . Pantoprazole Other (See Comments)    Headache and lightheaded  . Nitrofurantoin Hives    dizziness and lightheaded   . Sulfa Antibiotics Hives and Itching  . Lidocaine Swelling    Mouth swelling   . Tape Other (See Comments)    TAPE WILL TEAR THE SKIN!!!       Discharge Condition: Improved and stable CODE STATUS: Full code Diet recommendation: 2 g sodium diet Recommendations for Outpatient Follow-up:  1. Follow up with PCP in 1 week and nephrology in 7 to 10 days 2. Please obtain BMP/CBC in one week      The results of significant diagnostics from this hospitalization (including imaging, microbiology, ancillary and laboratory) are listed below for reference.     Microbiology: Recent Results (from the past 240 hour(s))  Urine culture     Status: Abnormal   Collection Time: 09/14/18 10:11 AM   Specimen: Urine, Random  Result Value Ref Range Status   Specimen Description URINE, RANDOM  Final   Special Requests   Final    NONE Performed at [facility], [address], [place], [place] [address]    Culture >=100,000 COLONIES/mL STENOTROPHOMONAS MALTOPHILIA (A)  Final   Report Status 09/17/2018 FINAL  Final   Organism ID, Bacteria STENOTROPHOMONAS MALTOPHILIA (A)  Final      Susceptibility   Stenotrophomonas maltophilia - MIC*    LEVOFLOXACIN <=0.12 SENSITIVE Sensitive     TRIMETH/SULFA <=20 SENSITIVE Sensitive     * >=100,000 COLONIES/mL STENOTROPHOMONAS MALTOPHILIA  Novel Coronavirus,NAA,(SEND-OUT TO REF LAB - TAT 24-48 hrs); Hosp Order     Status: None   Collection Time: 09/14/18 12:50 PM   Specimen: Nasopharyngeal Swab; Respiratory  Result Value Ref Range Status   SARS-CoV-2, NAA NOT DETECTED NOT DETECTED Final    Comment: (NOTE) This test was developed and its performance characteristics determined by [employer]. This test has not been FDA cleared or approved. This test has been authorized by FDA under an Emergency Use  Authorization (EUA). This test is only authorized for the duration of time the declaration that circumstances exist justifying the authorization of the emergency use of in vitro diagnostic tests for detection of SARS-CoV-2 virus and/or diagnosis of COVID-19 infection under section 564(b)(1) of the Act, 21 U.S.C. 517GYF-7(C)(9), unless the authorization is terminated or revoked sooner. When diagnostic testing is negative, the possibility of a false negative result should be considered in the context of a patient's recent exposures and the presence of clinical  signs and symptoms consistent with COVID-19. An individual without symptoms of COVID-19 and who is not shedding SARS-CoV-2 virus would expect to have a negative (not detected) result in this assay. Performed  At: [facility] [address] [place], [place] [id number] Rush Farmer MD EX:[id number]    [place]  Final    Comment: Performed at [facility], [place] [address] [place], [place] [address]     Labs: BNP (last 3 results) Recent Labs    12/26/17 0349 08/28/18 1536  BNP 280.6* 751.0*   Basic Metabolic Panel: Recent Labs  Lab 09/16/18 1231 09/17/18 0642 09/18/18 0525 09/19/18 0641 09/20/18 0533  NA 140 140 140 141 141  K 4.0 3.7 3.9 3.1* 4.3  CL 108 108 107 110 112*  CO2 19* 22 23 21* 21*  GLUCOSE 153* 120* 121* 111* 120*  BUN 59* 51* 50* 44* 39*  CREATININE 4.51* 4.11* 3.51* 3.20* 2.88*  CALCIUM 9.1 9.0 9.3 8.9 8.8*  MG  --  2.0  --   --   --    Liver Function Tests: Recent Labs  Lab 09/14/18 0949  AST 20  ALT 15  ALKPHOS 61  BILITOT 0.6  PROT 6.5  ALBUMIN 3.5   No results for input(s): LIPASE, AMYLASE in the last 168 hours. No results for input(s): AMMONIA in the last 168 hours. CBC: Recent Labs  Lab 09/14/18 0949 09/15/18 0455 09/17/18 0642 09/18/18 0525 09/19/18 0641 09/20/18 0533  WBC 7.8 7.4 6.5 5.7 6.2 5.9  NEUTROABS 5.7  --  4.5 3.8 4.1 4.0   HGB 8.6* 8.3* 8.9* 9.4* 9.2* 8.7*  HCT 26.3* 25.0* 26.7* 28.9* 27.6* 26.0*  MCV 92.6 92.6 92.7 92.6 92.6 93.2  PLT 147* 148* 169 174 173 157   Cardiac Enzymes: Recent Labs  Lab 09/14/18 0949  TROPONINI <0.03   BNP: Invalid input(s): POCBNP CBG: No results for input(s): GLUCAP in the last 168 hours. D-Dimer No results for input(s): DDIMER in the last 72 hours. Hgb A1c No results for input(s): HGBA1C in the last 72 hours. Lipid Profile No results for input(s): CHOL, HDL, LDLCALC, TRIG, CHOLHDL, LDLDIRECT in the last 72 hours. Thyroid function studies No results for input(s): TSH, T4TOTAL, T3FREE, THYROIDAB in the last 72 hours.  Invalid input(s): FREET3 Anemia work up No results for input(s): VITAMINB12, FOLATE, FERRITIN, TIBC, IRON, RETICCTPCT in the last 72 hours. Urinalysis    Component Value Date/Time   COLORURINE YELLOW 09/14/2018 1003   APPEARANCEUR HAZY (A) 09/14/2018 1003   LABSPEC 1.004 (L) 09/14/2018 1003   PHURINE 6.0 09/14/2018 1003   GLUCOSEU NEGATIVE 09/14/2018 1003   HGBUR LARGE (A) 09/14/2018 1003   BILIRUBINUR NEGATIVE 09/14/2018 1003   BILIRUBINUR neg 06/30/2014 1530   KETONESUR NEGATIVE 09/14/2018 1003   PROTEINUR NEGATIVE 09/14/2018 1003   UROBILINOGEN 0.2 12/04/2014 2344   NITRITE NEGATIVE 09/14/2018 1003   LEUKOCYTESUR LARGE (A) 09/14/2018 1003   Sepsis Labs Invalid input(s): PROCALCITONIN,  WBC,  LACTICIDVEN Microbiology Recent Results (from the past 240 hour(s))  Urine culture     Status: Abnormal   Collection Time: 09/14/18 10:11 AM   Specimen: Urine, Random  Result Value Ref Range Status   Specimen Description URINE, RANDOM  Final   Special Requests   Final    NONE Performed at [facility], [place] [address], [place], [place] [address]    Culture >=100,000 COLONIES/mL STENOTROPHOMONAS MALTOPHILIA (A)  Final   Report Status 09/17/2018 FINAL  Final  Organism ID, Bacteria STENOTROPHOMONAS MALTOPHILIA (A)  Final       Susceptibility   Stenotrophomonas maltophilia - MIC*    LEVOFLOXACIN <=0.12 SENSITIVE Sensitive     TRIMETH/SULFA <=20 SENSITIVE Sensitive     * >=100,000 COLONIES/mL STENOTROPHOMONAS MALTOPHILIA  Novel Coronavirus,NAA,(SEND-OUT TO REF LAB - TAT 24-48 hrs); Hosp Order     Status: None   Collection Time: 09/14/18 12:50 PM   Specimen: Nasopharyngeal Swab; Respiratory  Result Value Ref Range Status   SARS-CoV-2, NAA NOT DETECTED NOT DETECTED Final    Comment: (NOTE) This test was developed and its performance characteristics determined by [employer]. This test has not been FDA cleared or approved. This test has been authorized by FDA under an Emergency Use Authorization (EUA). This test is only authorized for the duration of time the declaration that circumstances exist justifying the authorization of the emergency use of in vitro diagnostic tests for detection of SARS-CoV-2 virus and/or diagnosis of COVID-19 infection under section 564(b)(1) of the Act, 21 U.S.C. 151VOH-6(W)(7), unless the authorization is terminated or revoked sooner. When diagnostic testing is negative, the possibility of a false negative result should be considered in the context of a patient's recent exposures and the presence of clinical signs and symptoms consistent with COVID-19. An individual without symptoms of COVID-19 and who is not shedding SARS-CoV-2 virus would expect to have a negative (not detected) result in this assay. Performed  At: [facility] [address] [place], [place] [id number] Rush Farmer MD WN:[id number]    [place]  Final    Comment: Performed at [facility], [place] [address], [place], [place] [address]    Procedures/Studies: Dg Chest 2 View  Result Date: 08/28/2018 CLINICAL DATA:  Shortness of breath. EXAM: CHEST - 2 VIEW COMPARISON:  Radiographs of July 01, 2018. FINDINGS: Stable cardiomediastinal silhouette. Status post coronary  bypass graft. No pneumothorax or pleural effusion is noted. Right lung is clear. Minimal left basilar subsegmental atelectasis or scarring is noted. Bony thorax is unremarkable. IMPRESSION: Minimal left basilar subsegmental atelectasis or scarring. Electronically Signed   By: Marijo Conception M.D.   On: 08/28/2018 16:01   US Renal  Result Date: 09/14/2018 CLINICAL DATA:  UTI EXAM: RENAL / URINARY TRACT ULTRASOUND COMPLETE COMPARISON:  12/26/2017 FINDINGS: Right Kidney: Renal measurements: 11.7 x 5.7 x 6.2 cm = volume: 218.4 mL. Slightly echogenic cortex. Renal cortical thinning. Moderate to marked hydronephrosis. Anechoic cyst in the upper pole measuring 6.2 x 5.3 x 4.5 cm. 14 mm suspected stone at the midpole. Left Kidney: Renal measurements: 11.1 x 5.2 x 5.8 cm = volume: 173.8 mL. Slightly echogenic cortex. Moderate hydronephrosis. No mass or shadowing stone. Bladder: Empty due to catheter. IMPRESSION: 1. Bilateral hydronephrosis, moderate severe on the right and moderate on the left. This is new as compared with ultrasound 12/26/2017. 2. Slightly echogenic cortex with cortical thinning consistent with medical renal disease and slight atrophy 3. Anechoic cyst upper pole right kidney. Probable 14 mm stone in the mid right kidney. Electronically Signed   By: Donavan Foil M.D.   On: 09/14/2018 18:33   Dg Chest Port 1 View  Result Date: 09/14/2018 CLINICAL DATA:  Chest pain and abnormal labs in [facility] center. EXAM: PORTABLE CHEST 1 VIEW COMPARISON:  Chest x-rays dated 08/28/2018, 07/01/2018 and 02/10/2009. FINDINGS: Heart size and mediastinal contours are stable. Median sternotomy wires in place, for presumed CABG. Mild bibasilar scarring/atelectasis. Lungs otherwise clear. No pleural effusion or pneumothorax seen.  No acute or suspicious osseous finding. Bilateral carotid atherosclerosis. IMPRESSION: 1. No active disease. No evidence of pneumonia or pulmonary edema. 2. Carotid atherosclerosis.  Electronically Signed   By: Franki Cabot M.D.   On: 09/14/2018 10:39   Ct Renal Stone Study  Result Date: 09/15/2018 CLINICAL DATA:  Chronic kidney disease with prostate cancer and suprapubic catheter. Worsening creatinine. Weakness and increased shortness of breath. EXAM: CT ABDOMEN AND PELVIS WITHOUT CONTRAST TECHNIQUE: Multidetector CT imaging of the abdomen and pelvis was performed following the standard protocol without IV contrast. COMPARISON:  02/19/2017 FINDINGS: Lower chest: Sternotomy wires are present. Mild cardiomegaly. Minimal scarring over the lung bases. Hepatobiliary: Prior cholecystectomy. No focal liver mass. Biliary tree is normal. Pancreas: Stable low-density focus over the head of the pancreas not well visualized on this noncontrast study. Spleen: Normal. Adrenals/Urinary Tract: Adrenal glands are normal. Kidneys are normal in size with stable large right renal cyst. There is new moderate bilateral hydroureteronephrosis. Suprapubic catheter again noted over the bladder which is decompressed. Mild increased density material within the decompressed bladder which may represent hemorrhagic debris. No definite focal mass. Stomach/Bowel: Stomach and small bowel are normal. Appendix is normal. There is diverticulosis of the colon most prominent over the sigmoid colon. No evidence of acute diverticulitis. Vascular/Lymphatic: Moderate calcified plaque over the abdominal aorta. No adenopathy. Reproductive: Multiple radiation seed implants over the prostate unchanged. Other: No free fluid or focal inflammatory change. Musculoskeletal: Moderate degenerative changes of the spine and mild degenerate change of the hips. IMPRESSION: Moderate symmetric bilateral hydroureteronephrosis which is new since the previous exam. Suprapubic catheter is in adequate position within a decompressed bladder. Mild increased density material within the bladder which may represent hemorrhagic debris. Moderate diverticulosis  of the colon without active inflammation. Moderate size right renal cyst unchanged. Stable 1.5 cm fluid attenuation focus over the pancreatic head not well evaluated on this noncontrast study. Recommend follow-up CT 1 year. Aortic Atherosclerosis (ICD10-I70.0). Electronically Signed   By: Marin Olp M.D.   On: 09/15/2018 14:59    (Echo, Carotid, EGD, Colonoscopy, ERCP)  Time coordinating discharge: Over 30 minutes  SIGNED:   [place] Shi, MD  [facility] 09/20/2018, 3:28 PM Pager   If 7PM-7AM, please contact night-coverage [email or web address] Password [email or web address]

## 2018-09-20 NOTE — TOC Transition Note (Signed)
Transition of Care [facility]) - CM/SW Discharge Note   Patient Details  Name: Ronald [name] MRN: [id number] Date of Birth: 1927-06-04  [facility] CM/SW Contact:  Midge Minium RN, BSN, NCM-BC, ACM-RN [phone] Phone Number: 09/20/2018, 2:16 PM   Clinical Narrative:    CM following for dispositional needs. CM spoke to the patient to discuss the POC. The patient states living at home with his spouse, having a SPC and RW to assist with ambulation and being able to independently perform his ADLs. Patient was open to [facility] for HHPT services; CMS [facility] Compare preference provided, with the patient requesting to continue [facility] with [facility] [place] referral given to Butch Penny RN, [place] liaison for resumption of care; AVS updated. Patient indicated his wife will provide transportation home. No further needs from CM.    Final next level of care: [place] Barriers to Discharge: No Barriers Identified   Patient Goals and CMS Choice Patient states their goals for this hospitalization and ongoing recovery are:: "to return back home" CMS Medicare.gov Compare Post Acute Care list provided to:: Patient Choice offered to / list presented to : Patient   Discharge Plan and Services           DME Arranged: N/A DME Agency: NA       HH Arranged: PT [place] Agency: [place] ([facility]) Date [place]: 09/20/18 Time [place]: 1416 Representative spoke with at [place]: [place], liaison  Social Determinants of Health ([place]) Interventions     Readmission Risk Interventions Readmission Risk Prevention Plan 09/20/2018  Transportation Screening Complete  Medication Review Press photographer) Complete  PCP or Specialist appointment within 3-5 days of discharge Complete  HRI or [place] Complete  SW Recovery Care/Counseling Consult Complete  [place] Not Applicable  Some  recent data might be hidden

## 2018-09-21 ENCOUNTER — Telehealth: Payer: Self-pay | Admitting: *Deleted

## 2018-09-21 NOTE — Telephone Encounter (Signed)
Transition Care Management Follow-up Telephone Call   Date discharged? 09/20/2018   How have you been since you were released from the hospital? He seems to be doing better this time than the previous time he came home from the hospital    Do you understand why you were in the hospital? yes   Do you understand the discharge instructions? No (wife had a question  in regard to medication Levaquin). The wife reports he is supposed to take one pill every other day and when she went to the pharmacy there was only two pills in the bottle. He is upposed on this medication for 5 days.    Where were you discharged to? Home    Items Reviewed:  Medications reviewed: yes  Allergies reviewed: yes  Dietary changes reviewed: yes low sodium heart healthy   Referrals reviewed: yes   Functional Questionnaire:   Activities of Daily Living (ADLs):   He states they are independent in the following: ambulation, bathing and hygiene, feeding, continence, grooming, toileting and dressing States they require assistance with the following: N/A    Any transportation issues/concerns?: no   Any patient concerns? Yes talked with patient about Dialysis (wife and patient wants to know what that entails    Confirmed importance and date/time of follow-up visits scheduled yes    Provider Appointment booked with Dr. Jerilee Hoh Tuesday 09/25/2018 at 3:00PM   Confirmed with patient if condition begins to worsen call PCP or go to the ER.  Patient was given the office number and encouraged to call back with question or concerns.  : yes

## 2018-09-25 ENCOUNTER — Encounter: Payer: Self-pay | Admitting: Internal Medicine

## 2018-09-25 ENCOUNTER — Other Ambulatory Visit: Payer: Self-pay

## 2018-09-25 ENCOUNTER — Ambulatory Visit (INDEPENDENT_AMBULATORY_CARE_PROVIDER_SITE_OTHER): Payer: Medicare Other | Admitting: Internal Medicine

## 2018-09-25 ENCOUNTER — Ambulatory Visit: Payer: Medicare Other | Admitting: Physician Assistant

## 2018-09-25 VITALS — BP 110/70 | HR 69 | Temp 98.1°F | Wt 158.8 lb

## 2018-09-25 DIAGNOSIS — T83510A Infection and inflammatory reaction due to cystostomy catheter, initial encounter: Secondary | ICD-10-CM | POA: Diagnosis not present

## 2018-09-25 DIAGNOSIS — Z9359 Other cystostomy status: Secondary | ICD-10-CM

## 2018-09-25 DIAGNOSIS — N390 Urinary tract infection, site not specified: Secondary | ICD-10-CM

## 2018-09-25 DIAGNOSIS — I214 Non-ST elevation (NSTEMI) myocardial infarction: Secondary | ICD-10-CM

## 2018-09-25 DIAGNOSIS — N179 Acute kidney failure, unspecified: Secondary | ICD-10-CM

## 2018-09-25 DIAGNOSIS — I2510 Atherosclerotic heart disease of native coronary artery without angina pectoris: Secondary | ICD-10-CM | POA: Diagnosis not present

## 2018-09-25 DIAGNOSIS — Z09 Encounter for follow-up examination after completed treatment for conditions other than malignant neoplasm: Secondary | ICD-10-CM | POA: Diagnosis not present

## 2018-09-25 DIAGNOSIS — H6121 Impacted cerumen, right ear: Secondary | ICD-10-CM | POA: Diagnosis not present

## 2018-09-25 NOTE — Patient Instructions (Signed)
-  Nice seeing you today!  -Labwork today; will notify you once results are available.  -Schedule follow up with family to continue discussion regarding short and long-term goals of care.

## 2018-09-25 NOTE — Progress Notes (Signed)
Hospital follow-up visit     CC/Reason for Visit: Hospitalization follow-up  HPI: Ronald [name] is a 85 y.o. male who is coming in today for the above mentioned reasons, specifically transitional care services face-to-face visit.    Dates hospitalized: 09/14/18-09/20/18 Days since discharge from hospital: 4 days Patient was discharged from the hospital to: home Reason for admission to hospital: Acute renal failure, anemia, catheter associated UTI Date of interactive phone contact with patient and/or caregiver: 09/21/18  I have reviewed in detail patient's discharge summary plus pertinent specific notes, labs, and images from the hospitalization.  After our video visit on 09/11/18 I requested an urgent cardioology appointment due to recent NSTEMI, increased weakness and SOB and orthopnea. When seen by cards that day, labs showed a significant increase in Cr from a baseline of around 1.1-1.5 to 5.35 and he was referred to the hospital for evaluation. I had instructed he take lasix 20 mg daily on 6/16. In the ED he was also found to have a catheter-associated UTI and acute urinary retention with hydronephrosis. Urology exchanged his suprapubic catheter. He has also found to be anemic and GI was consulted who recommended conservative management and increase PPI to BID.  Medication reconciliation was done today and patient is taking meds as recommended by discharging hospitalist/specialist: yes. He has now completed abx therapy.  He continues to feel weak, nauseous, dizzy, SOB and with significant muscle cramps. He has marked LE edema, that has worsened since his hospital DC. He wants to know of any medications we can give him to help with that.  He is accompanied by his daughter. We had a long conversation about short and long-term goals of care. He agrees that he wants better quality of life as opposed to quantity. We talked about code status. He feels he would like to be a DNR, but would like  to discuss with wife. He has an appointment with his cardiologist next week. Daughter would like to see if they have any other suggestions, if not we will have another family meeting to discuss [place]. He would a be a poor dialysis candidate. Treating his CHF is difficult in light of marked recent ARF when started on diuretic therapy.  He complains of right ear pain today.   Past Medical/Surgical History: Past Medical History:  Diagnosis Date  . Anemia 09/14/2018  . Arthritis    "joints ache" (01/02/2014)  . At risk for sleep apnea    STOP-BANG= 4    SENT TO PCP 12-23-2013  . Bladder calculi   . Bradycardia   . Chronic cystitis   . Chronic systolic CHF (congestive heart failure) (HCC)    a. EF improved to 50-55% in 2018, previously 35-40%  . CKD (chronic kidney disease), stage II   . Coronary artery disease CARDIOLOGIST-  DR Angelena Form   a. CAD s/p CABG in 1989. b. redo bypass in 1997. c. last cath 2015 - med rx.  . Diverticulosis   . GERD (gastroesophageal reflux disease)   . History of Bell's palsy    RIGHT SIDE-- NO RESIDUAL  . Hx of dizziness   . Hyperlipidemia   . Hypertension    "not anymore" (01/02/2014)  . Ischemic cardiomyopathy   . Kidney stones "years ago"   "passed them"  . Melanoma of ear ([place])    "right"  . Mild dementia ([place])   . Myocardial infarction ([place]) 1986; 1997  . Nocturia   . OAB (overactive bladder)   .  Persistent atrial fibrillation   . Prostate cancer ([place]) 1998   S/P  [place]; [place]  . S/P CABG (coronary artery bypass graft)    [place]  . Sepsis due to urinary tract infection ([place])   . Urethral stricture   . UTI (urinary tract infection) 09/2015  . Wears hearing aid    bilateral  . Wears partial dentures     Past Surgical History:  Procedure Laterality Date  . CARDIAC CATHETERIZATION  02-22-2005  dr Vidal Schwalbe   mild to moderate lv dysfunction with inferobasilar akinesis/  totally occluded SVG to  Intermediate Diagonal and SVG to PDA and RCA branches, totally occluded native coronary circulation with diffuse disease pLAD diagonal system with potentially could be ischemic/  patent SVG to OM with collaterals to dRCA and patent LIMA to LAD and diagonal systemss  . CARDIAC CATHETERIZATION  08-28-2013  DR Daneen Schick   widely patent sequential left internal graft to the diagonal/LAD, widely patent SVG to OM with proximal 50% narrowing noted in the graft, total occlusion SVG's to RCA, RI, and the Diagonal/ LV dysfunction with inferobasal aneurysm and mid anterior wall region of akinesis/  overall EF 35-40%/  Total occlusion of the navtive circulation/  no significant change compared to 2006 cath  . CARDIOVASCULAR STRESS TEST  08-01-2011  dr Angelena Form   inferior scar and possible soft tissue attenuation with minimal peri-infarct ischemia, small region of anterior ischemia and scar/  LVEF 53% LV wall motion with inferior hypokinesis/ no significant change from scan july 2011  . CATARACT EXTRACTION W/ INTRAOCULAR LENS  IMPLANT, BILATERAL Bilateral 2007  . CORONARY ARTERY BYPASS GRAFT  11/ [place]-- 6 vessel/  1997 Re-do 5 vessel  . CYSTOSCOPY WITH URETHRAL DILATATION N/A 12/25/2013   Procedure: CYSTOSCOPY WITH URETHRAL DILATATION, WITH BIOPSY;  Surgeon: Bernestine Amass, MD;  [facility];  Service: Urology;  Laterality: N/A;  . CYSTOSCOPY WITH URETHRAL DILATATION N/A 12/22/2014   Procedure: CYSTOSCOPY WITH URETHRAL DILATATION;  Surgeon: Rana Snare, MD;  Location: [facility];  Service: Urology;  Laterality: N/A;  BALLOON DILATION CATHETER    . EUS  10/05/2011   Procedure: ESOPHAGEAL ENDOSCOPIC ULTRASOUND (EUS) RADIAL;  Surgeon: Arta Silence, MD;  Location: [facility];  Service: Endoscopy;  Laterality: N/A;  . INSERTION OF SUPRAPUBIC CATHETER N/A 12/22/2014   Procedure: INSERTION OF SUPRAPUBIC CATHETER ;  Surgeon: Rana Snare, MD;  Location: [facility];  Service:  Urology;  Laterality: N/A;  . LAPAROSCOPIC CHOLECYSTECTOMY  12-23-2005  . LEFT HEART CATHETERIZATION WITH CORONARY/GRAFT ANGIOGRAM N/A 08/28/2013   Procedure: LEFT HEART CATHETERIZATION WITH Beatrix Fetters;  Surgeon: Sinclair Grooms, MD;  Location: [facility] C[facility];  Service: Cardiovascular;  Laterality: N/A;  . MELANOMA EXCISION  X 1   "ear"  . RADIOACTIVE PROSTATE SEED IMPLANTS  1998  . SKIN CANCER EXCISION  X 2   "top of head"  . TONSILLECTOMY AND ADENOIDECTOMY  1954    Social History:  reports that he quit smoking about 34 years ago. His smoking use included cigars. He quit after 40.00 years of use. He quit smokeless tobacco use about 5 years ago.  His smokeless tobacco use included chew. He reports that he does not drink alcohol or use drugs.  Allergies: Allergies  Allergen Reactions  . Pantoprazole Other (See Comments)    Headache and lightheaded  . Nitrofurantoin Hives  dizziness and lightheaded   . Sulfa Antibiotics Hives and Itching  . Lidocaine Swelling    Mouth swelling   . Tape Other (See Comments)    TAPE WILL TEAR THE SKIN!!!    Family History:  Family History  Problem Relation Age of Onset  . Heart disease Mother   . Diabetes Father   . Heart disease Brother      Current Outpatient Medications:  .  acetaminophen (TYLENOL) 500 MG tablet, Take 500 mg by mouth every 6 (six) hours as needed for moderate pain or fever. , Disp: , Rfl:  .  apixaban (ELIQUIS) 5 MG TABS tablet, Take 2.5 mg by mouth 2 (two) times daily., Disp: , Rfl:  .  atorvastatin (LIPITOR) 80 MG tablet, Take 0.5 tablets (40 mg total) by mouth every evening., Disp: , Rfl:  .  Calcium Carbonate Antacid (TUMS PO), Take 3 tablets by mouth daily as needed (gas)., Disp: , Rfl:  .  furosemide (LASIX) 20 MG tablet, Take 1 tablet (20 mg total) by mouth daily as needed for fluid or edema., Disp: 30 tablet, Rfl: 2 .  isosorbide mononitrate (IMDUR) 30 MG 24 hr tablet, Take 0.5 tablets (15 mg total) by  mouth daily., Disp: 45 tablet, Rfl: 3 .  levofloxacin (LEVAQUIN) 250 MG tablet, Take 1 tablet (250 mg total) by mouth every other day for 5 days., Disp: 2 tablet, Rfl: 0 .  meclizine (ANTIVERT) 12.5 MG tablet, Take 1 tablet (12.5 mg total) by mouth 3 (three) times daily as needed for dizziness., Disp: 90 tablet, Rfl: 0 .  Multiple Vitamin (MULTIVITAMIN WITH MINERALS) TABS, Take 1 tablet by mouth daily., Disp: , Rfl:  .  nitroGLYCERIN (NITROSTAT) 0.4 MG SL tablet, Place 0.4 mg under the tongue every 5 (five) minutes as needed for chest pain. , Disp: , Rfl:  .  omeprazole (PRILOSEC) 40 MG capsule, Take 1 capsule (40 mg total) by mouth daily., Disp: 30 capsule, Rfl: 3 .  ondansetron (ZOFRAN ODT) 4 MG disintegrating tablet, Take 1 tablet (4 mg total) by mouth every 8 (eight) hours as needed for nausea or vomiting., Disp: 20 tablet, Rfl: 0 .  Polyethyl Glycol-Propyl Glycol (SYSTANE) 0.4-0.3 % SOLN, Apply 1 drop to eye 3 (three) times daily., Disp: , Rfl:   Review of Systems:  Constitutional: Denies fever, chills, diaphoresis. HEENT: Denies photophobia, eye pain, redness, hearing loss, ear pain, congestion, sore throat, rhinorrhea, sneezing, mouth sores, trouble swallowing, neck pain, neck stiffness and tinnitus.   Respiratory: Denies  cough, chest tightness,  and wheezing.   Cardiovascular: Denies chest pain, palpitations and leg swelling.  Gastrointestinal: Denies vomiting, abdominal pain, diarrhea, constipation, blood in stool and abdominal distention.  Genitourinary: Denies dysuria, urgency, frequency, hematuria, flank pain and difficulty urinating.  Endocrine: Denies: hot or cold intolerance, sweats, changes in hair or nails, polyuria, polydipsia. Musculoskeletal: Denies myalgias, back pain, joint swelling, arthralgias and gait problem.  Skin: Denies pallor, rash and wound.  Neurological: Denies dizziness, seizures, syncope, weakness, light-headedness, numbness and headaches.  Hematological:  Denies adenopathy. Easy bruising, personal or family bleeding history  Psychiatric/Behavioral: Denies suicidal ideation, mood changes, confusion, nervousness, sleep disturbance and agitation    Physical Exam: Vitals:   09/25/18 1452  BP: 110/70  Pulse: 69  Temp: 98.1 F (36.7 C)  TempSrc: Oral  SpO2: 97%  Weight: 158 lb 12.8 oz (72 kg)    Body mass index is 26.43 kg/m.   Constitutional: NAD, calm, comfortable, walks with cane Eyes: PERRL, lids and  conjunctivae normal ENMT: Mucous membranes are moist. Tympanic membrane is pearly white, no erythema or bulging on the left, right is obstructed by cerumen. Neck: normal, supple, no masses, no thyromegaly Respiratory: clear to auscultation bilaterally, no wheezing, no crackles. Normal respiratory effort. No accessory muscle use.  Cardiovascular: Regular rate and rhythm, no murmurs / rubs / gallops. No extremity edema. 2+ pedal pulses. No carotid bruits.  Abdomen: no tenderness, no masses palpated. No hepatosplenomegaly. Bowel sounds positive.  Musculoskeletal: no clubbing / cyanosis. No joint deformity upper and lower extremities. Good ROM, no contractures. Normal muscle tone.  Skin: no rashes, lesions, ulcers. No induration Neurologic: CN 2-12 grossly intact. Sensation intact, DTR normal. Strength 5/5 in all 4.  Psychiatric: Normal judgment and insight. Alert and oriented x 3. Normal mood.    Impression and Plan:  Hospital discharge follow-up Atherosclerosis of native coronary artery of native heart without angina pectoris  Urinary tract infection without hematuria, site unspecified  NSTEMI (non-ST elevated myocardial infarction) (HCC)  Acute renal failure, unspecified acute renal failure type ([place])  Suprapubic catheter (HCC)  Urinary tract infection associated with cystostomy catheter, initial encounter ([place])   -Check BMET today to follow renal function and electrolytes as recommended by discharging MD. -Have spent a  considerable time discussing end of life care with Ronald [name] and daughter. -Patient would like to focus on quality as opposed to quantity of life. Issues like: SOB, nausea and leg cramps cause significant morbidity for him. -Daughter believes this is also the right step. They would like to discuss with cardiology first (appointment next week), and then they would like to set up a family appointment with wife, daughter and son to further discuss. I believe moving forward with hospice would be in patient's best interests at this time.   Right Impacted Cerumen Cerumen Desimpaction  Warm water was applied and gentle ear lavage performed on right ears. There were no complications and following the desimpaction the tympanic membranes were visible. Tympanic membranes are intact following the procedure. Auditory canals are normal. The patient reported relief of symptoms after removal of cerumen.    Medical decision making of highcomplexity was utilized today.  Patient Instructions  -Nice seeing you today!  -Labwork today; will notify you once results are available.  -Schedule follow up with family to continue discussion regarding short and long-term goals of care.     Lelon Frohlich, MD [place] [facility]

## 2018-09-26 LAB — BASIC METABOLIC PANEL
BUN: 44 mg/dL — ABNORMAL HIGH (ref 6–23)
CO2: 22 mEq/L (ref 19–32)
Calcium: 8.7 mg/dL (ref 8.4–10.5)
Chloride: 105 mEq/L (ref 96–112)
Creatinine, Ser: 2.99 mg/dL — ABNORMAL HIGH (ref 0.40–1.50)
GFR: 19.87 mL/min — ABNORMAL LOW (ref >60.00)
Glucose, Bld: 102 mg/dL — ABNORMAL HIGH (ref 70–99)
Potassium: 4.4 mEq/L (ref 3.5–5.1)
Sodium: 139 mEq/L (ref 135–145)

## 2018-09-26 LAB — MAGNESIUM: Magnesium: 2 mg/dL (ref 1.5–2.5)

## 2018-09-27 ENCOUNTER — Telehealth: Payer: Self-pay | Admitting: *Deleted

## 2018-09-27 ENCOUNTER — Other Ambulatory Visit: Payer: Self-pay | Admitting: *Deleted

## 2018-09-27 NOTE — Patient Outreach (Addendum)
[facility]) Care Management  09/27/2018  Ronald [name] 1927-11-25 [id number]   Subjective: Telephone call to patient's home number, no answer, no voicemail / no answering machine, and unable to leave a message.    Objective: Per KPN (Knowledge Performance Now, point of care tool) and chart review, patient hospitalized 09/14/2018 -09/20/2018 for Acute renal failure (ARF), Anemia due to chronic kidney disease.  Patient also has a history of CAD status post CABG, Ischemic cardiomyopathy, Chronic atrial fibrillation, chronic kidney disease stage 3, anemia, and Prostate cancer with bladder outlet obstruction status post suprapubic catheter.      Assessment: Received Medicare EMMI General Discharge Red Flag Alert follow up referral on 09/26/2018.  Red Flag Alert Trigger, Day #1, patient answered yes to the following question: Lost interest in things?  [facility] EMMI follow up pending patient contact.      Plan: RNCM will send unsuccessful outreach letter, [facility] pamphlet, handout: Know Before You Go, will call patient for 2nd telephone outreach attempt within 4 business days, [facility] EMMI follow up, and proceed with case closure, within 10 business days if no return call.      Lilybelle Mayeda H. Annia Friendly, BSN, [facility] Telephonic CM Phone: [phone] Fax: [phone]

## 2018-09-27 NOTE — Telephone Encounter (Signed)
   Appt- July 6,2020 with Dr. Angelena Form COVID-19 Pre-Screening Questions:  . In the past 7 to 10 days have you had a cough,  shortness of breath, headache, congestion, fever (100 or greater) body aches, chills, sore throat, or sudden loss of taste or sense of smell? no . Have you been around anyone with known Covid 19.-no . Have you been around anyone who is awaiting Covid 19 test results in the past 7 to 10 days? no . Have you been around anyone who has been exposed to Covid 19, or has mentioned symptoms of Covid 19 within the past 7 to 10 days? no  If you have any concerns/questions about symptoms patients report during screening (either on the phone or at threshold). Contact the provider seeing the patient or DOD for further guidance.  If neither are available contact a member of the leadership team.       I spoke with pt's wife who answered screening questions. She will accompany pt to appt as he is unsteady on his feet and has memory issues.  Pt's wife is aware she and pt need to wear mask to appointment. I asked them to arrive 15 minutes prior to appt

## 2018-10-01 ENCOUNTER — Encounter: Payer: Self-pay | Admitting: Cardiovascular Disease

## 2018-10-01 ENCOUNTER — Ambulatory Visit (INDEPENDENT_AMBULATORY_CARE_PROVIDER_SITE_OTHER): Payer: Medicare Other | Admitting: Cardiovascular Disease

## 2018-10-01 ENCOUNTER — Other Ambulatory Visit: Payer: Self-pay

## 2018-10-01 VITALS — BP 136/64 | HR 74 | Ht 65.0 in | Wt 156.0 lb

## 2018-10-01 DIAGNOSIS — I10 Essential (primary) hypertension: Secondary | ICD-10-CM | POA: Diagnosis not present

## 2018-10-01 DIAGNOSIS — I25810 Atherosclerosis of coronary artery bypass graft(s) without angina pectoris: Secondary | ICD-10-CM

## 2018-10-01 DIAGNOSIS — I5022 Chronic systolic (congestive) heart failure: Secondary | ICD-10-CM

## 2018-10-01 DIAGNOSIS — I214 Non-ST elevation (NSTEMI) myocardial infarction: Secondary | ICD-10-CM

## 2018-10-01 DIAGNOSIS — I255 Ischemic cardiomyopathy: Secondary | ICD-10-CM

## 2018-10-01 DIAGNOSIS — E7800 Pure hypercholesterolemia, unspecified: Secondary | ICD-10-CM | POA: Diagnosis not present

## 2018-10-01 MED ORDER — FUROSEMIDE 20 MG PO TABS: 20.0000 mg | ORAL_TABLET | Freq: Every day | ORAL | 11 refills | Status: AC

## 2018-10-01 NOTE — Patient Instructions (Signed)
Medication Instructions:  Please take your Lasix 20 mg DAILY.  Labwork: None  Testing/Procedures: None  Follow-Up: Your provider wants you to follow-up in: 3 months with Dr. Camillia Herter assistant. You will receive a reminder letter in the mail two months in advance. If you don't receive a letter, please call our office to schedule the follow-up appointment.    [facility]: Phone: [phone] Dr. Marcos Eke

## 2018-10-01 NOTE — Progress Notes (Signed)
-  Chief Complaint  Patient presents with  . Follow-up    CAD   History of Present Illness: 85 yo male with history of CAD s/p CABG in 1989 and redo bypass in 1997, HTN, HLD, mild dementia, prostate cancer and atrial fibrillation here today for cardiac follow up. Ronald [name] was admitted to [facility] June 2015 with chest pain. Cardiac cath 08/28/13 with 95% left main stenosis, occluded LAD, occluded Circumflex, occluded RCA. Patent LIMA to LAD, patent SVG to OM supplying collaterals to RCA. Occluded SVG to RCA and SVG to intermediate branch. Medical therapy recommended. LVEF=35-40% by LV gram in 2015. His pain resolved with addition of Zantac. Ronald [name] had several admissions in 2015-2017 with urosepsis with known history of kidney stones and urethral stricture. Echo October 2017 with LVEF=40-45%, mild [name] was admitted to [facility] November 2018 with recurrent urosepsis and atrial fibrillation was noted. Dr. Caryl Comes provided a curbside consult and recommended that the patient be started on Eliquis. Ronald [name] was discharged on Eliquis. ASA was stopped. Echo November 2018 with LVEF=50-55%. Cardiac monitor December 2018 with episodes of atrial fibrillation. Ronald [name] has not tolerated beta blockers due to bradycardia. Imdur was stopped due to hypotension. Ronald [name] has ongoing dizziness. Admitted June 2020 with dyspnea and acute renal failure with creatinine up to 5.3. Ronald [name] was seen by Nephrology and felt to have acute on chronic renal failure due to obstructed suprapubic catheter. Echo 08/29/18 with LVEF=40-45% with hypokinesis of the basal and mid inferoseptal walls. Ronald [name] was started on Antivert and this seemed to help with his dizziness.   Heis here today for follow up. Ronald [name] does not feel well. Recent discussion in primary care The patient denies any chest pain, dyspnea, palpitations, lower extremity edema, orthopnea, PND, dizziness, near syncope or syncope.     Primary Care Physician: Isaac Bliss, [name], MD  Past Medical History:  Diagnosis Date   . Anemia 09/14/2018  . Arthritis    "joints ache" (01/02/2014)  . At risk for sleep apnea    STOP-BANG= 4    SENT TO PCP 12-23-2013  . Bladder calculi   . Bradycardia   . Chronic cystitis   . Chronic systolic CHF (congestive heart failure) (HCC)    a. EF improved to 50-55% in 2018, previously 35-40%  . CKD (chronic kidney disease), stage II   . Coronary artery disease CARDIOLOGIST-  DR Angelena Form   a. CAD s/p CABG in 1989. b. redo bypass in 1997. c. last cath 2015 - med rx.  . Diverticulosis   . GERD (gastroesophageal reflux disease)   . History of Bell's palsy    RIGHT SIDE-- NO RESIDUAL  . Hx of dizziness   . Hyperlipidemia   . Hypertension    "not anymore" (01/02/2014)  . Ischemic cardiomyopathy   . Kidney stones "years ago"   "passed them"  . Melanoma of ear ([place])    "right"  . Mild dementia ([place])   . Myocardial infarction ([place]) 1986; 1997  . Nocturia   . OAB (overactive bladder)   . Persistent atrial fibrillation   . Prostate cancer ([place]) 1998   S/P  [place]; [place]  . S/P CABG (coronary artery bypass graft)    [place]  . Sepsis due to urinary tract infection ([place])   . Urethral stricture   . UTI (urinary tract infection) 09/2015  . Wears hearing aid    bilateral  . Wears partial dentures  Past Surgical History:  Procedure Laterality Date  . CARDIAC CATHETERIZATION  02-22-2005  dr Vidal Schwalbe   mild to moderate lv dysfunction with inferobasilar akinesis/  totally occluded SVG to Intermediate Diagonal and SVG to PDA and RCA branches, totally occluded native coronary circulation with diffuse disease pLAD diagonal system with potentially could be ischemic/  patent SVG to OM with collaterals to dRCA and patent LIMA to LAD and diagonal systemss  . CARDIAC CATHETERIZATION  08-28-2013  DR Daneen Schick   widely patent sequential left internal graft to the diagonal/LAD, widely patent SVG to OM with proximal 50% narrowing noted in the  graft, total occlusion SVG's to RCA, RI, and the Diagonal/ LV dysfunction with inferobasal aneurysm and mid anterior wall region of akinesis/  overall EF 35-40%/  Total occlusion of the navtive circulation/  no significant change compared to 2006 cath  . CARDIOVASCULAR STRESS TEST  08-01-2011  dr Angelena Form   inferior scar and possible soft tissue attenuation with minimal peri-infarct ischemia, small region of anterior ischemia and scar/  LVEF 53% LV wall motion with inferior hypokinesis/ no significant change from scan july 2011  . CATARACT EXTRACTION W/ INTRAOCULAR LENS  IMPLANT, BILATERAL Bilateral 2007  . CORONARY ARTERY BYPASS GRAFT  11/ [place]-- 6 vessel/  1997 Re-do 5 vessel  . CYSTOSCOPY WITH URETHRAL DILATATION N/A 12/25/2013   Procedure: CYSTOSCOPY WITH URETHRAL DILATATION, WITH BIOPSY;  Surgeon: Bernestine Amass, MD;  Location[facility];  Service: Urology;  Laterality: N/A;  . CYSTOSCOPY WITH URETHRAL DILATATION N/A 12/22/2014   Procedure: CYSTOSCOPY WITH URETHRAL DILATATION;  Surgeon: Rana Snare, MD;  Location: [facility];  Service: Urology;  Laterality: N/A;  BALLOON DILATION CATHETER    . EUS  10/05/2011   Procedure: ESOPHAGEAL ENDOSCOPIC ULTRASOUND (EUS) RADIAL;  Surgeon: Arta Silence, MD;  Location: [facility];  Service: Endoscopy;  Laterality: N/A;  . INSERTION OF SUPRAPUBIC CATHETER N/A 12/22/2014   Procedure: INSERTION OF SUPRAPUBIC CATHETER ;  Surgeon: Rana Snare, MD;  Location: [facility];  Service: Urology;  Laterality: N/A;  . LAPAROSCOPIC CHOLECYSTECTOMY  12-23-2005  . LEFT HEART CATHETERIZATION WITH CORONARY/GRAFT ANGIOGRAM N/A 08/28/2013   Procedure: LEFT HEART CATHETERIZATION WITH Beatrix Fetters;  Surgeon: Sinclair Grooms, MD;  Location: [facility] C[facility] LAB;  Service: Cardiovascular;  Laterality: N/A;  . MELANOMA EXCISION  X 1   "ear"  . RADIOACTIVE PROSTATE SEED IMPLANTS  1998  . SKIN CANCER EXCISION  X 2   "top of head"  .  TONSILLECTOMY AND ADENOIDECTOMY  1954    Current Outpatient Medications  Medication Sig Dispense Refill  . acetaminophen (TYLENOL) 500 MG tablet Take 500 mg by mouth every 6 (six) hours as needed for moderate pain or fever.     Marland Kitchen apixaban (ELIQUIS) 5 MG TABS tablet Take 2.5 mg by mouth 2 (two) times daily.    Marland Kitchen atorvastatin (LIPITOR) 80 MG tablet Take 0.5 tablets (40 mg total) by mouth every evening.    . Calcium Carbonate Antacid (TUMS PO) Take 3 tablets by mouth daily as needed (gas).    . furosemide (LASIX) 20 MG tablet Take 1 tablet (20 mg total) by mouth daily. 30 tablet 11  . isosorbide mononitrate (IMDUR) 30 MG 24 hr tablet Take 0.5 tablets (15 mg total) by mouth daily. 45 tablet 3  . meclizine (ANTIVERT) 12.5 MG tablet Take 1 tablet (12.5 mg total) by mouth 3 (three) times daily as needed for dizziness. [place]  tablet 0  . Multiple Vitamin (MULTIVITAMIN WITH MINERALS) TABS Take 1 tablet by mouth daily.    . nitroGLYCERIN (NITROSTAT) 0.4 MG SL tablet Place 0.4 mg under the tongue every 5 (five) minutes as needed for chest pain.     Marland Kitchen omeprazole (PRILOSEC) 40 MG capsule Take 1 capsule (40 mg total) by mouth daily. 30 capsule 3  . ondansetron (ZOFRAN ODT) 4 MG disintegrating tablet Take 1 tablet (4 mg total) by mouth every 8 (eight) hours as needed for nausea or vomiting. 20 tablet 0  . Polyethyl Glycol-Propyl Glycol (SYSTANE) 0.4-0.3 % SOLN Apply 1 drop to eye 3 (three) times daily.     No current facility-administered medications for this visit.     Allergies  Allergen Reactions  . Pantoprazole Other (See Comments)    Headache and lightheaded  . Nitrofurantoin Hives    dizziness and lightheaded   . Sulfa Antibiotics Hives and Itching  . Lidocaine Swelling    Mouth swelling   . Tape Other (See Comments)    TAPE WILL TEAR THE SKIN!!!    Social History   Socioeconomic History  . Marital status: Married    Spouse name: Not on file  . Number of children: Not on file  . Years of  education: Not on file  . Highest education level: Not on file  Occupational History  . Not on file  Social Needs  . Financial resource strain: Not on file  . Food insecurity    Worry: Not on file    Inability: Not on file  . Transportation needs    Medical: Not on file    Non-medical: Not on file  Tobacco Use  . Smoking status: Former Smoker    Years: 40.00    Types: Cigars    Quit date: 12/24/1983    Years since quitting: 34.7  . Smokeless tobacco: Former Systems developer    Types: Chew    Quit date: 08/28/2013  . Tobacco comment: smoked a pipe  Substance and Sexual Activity  . Alcohol use: No  . Drug use: No  . Sexual activity: Not on file  Lifestyle  . Physical activity    Days per week: Not on file    Minutes per session: Not on file  . Stress: Not on file  Relationships  . Social Herbalist on phone: Not on file    Gets together: Not on file    Attends religious service: Not on file    Active member of club or organization: Not on file    Attends meetings of clubs or organizations: Not on file    Relationship status: Not on file  . Intimate partner violence    Fear of current or ex partner: Not on file    Emotionally abused: Not on file    Physically abused: Not on file    Forced sexual activity: Not on file  Other Topics Concern  . Not on file  Social History Narrative   Was a Clinical cytogeneticist   Was in the [employer] x 4 years   Father died and felt Ronald [name] needed to come home to help his mother    Was asked to rejoin      2 children; boy and girl   3 grand children   One grandson in [place], one grandson in [place];    [place] a job as Nurse, adult dtr; [place].     Family History  Problem  Relation Age of Onset  . Heart disease Mother   . Diabetes Father   . Heart disease Brother     Review of Systems:  As stated in the HPI and otherwise negative.   BP 136/64   Pulse 74   Ht 5\' 5"  (1.651 m)   Wt 156 lb (70.8 kg)   SpO2 98%   BMI 25.96  kg/m   Physical Examination:  General: Well developed, well nourished, NAD  HEENT: OP clear, mucus membranes moist  SKIN: warm, dry. No rashes. Neuro: No focal deficits  Musculoskeletal: Muscle strength 5/5 all ext  Psychiatric: Mood and affect normal  Neck: No JVD, no carotid bruits, no thyromegaly, no lymphadenopathy.  Lungs:Clear bilaterally, no wheezes, rhonci, crackles Cardiovascular: Regular rate and rhythm. No murmurs, gallops or rubs. Abdomen:Soft. Bowel sounds present. Non-tender.  Extremities: No lower extremity edema. Pulses are 2 + in the bilateral DP/PT.  Cardiac cath 08/28/13: The left main coronary artery is is severely involved distally with a string-like obstruction of at least 95%. This vessel is heavily calcified..  The left anterior descending artery is totally occluded..  The left circumflex artery is totally occluded.  The right coronary artery is totally occluded proximally. Right to right homocollaterals and noted to feel a right ventricular branch.Marland Kitchen  BYPASS GRAFT ANGIOGRAPHY: The saphenous vein graft to the obtuse marginal is widely patent there is proximal segmental 30-50% narrowing. The obtuse marginal territory supplies collaterals to the distal right coronary.  The saphenous vein graft to the right coronary is totally occluded  The saphenous vein graft to the ramus/diagonal is totally occluded  The left internal mammary graft to the LAD and diagonal is widely patent  LEFT VENTRICULOGRAM: Left ventricular angiogram was done in the 30 RAO projection and revealed a well formed inferoposterior aneurysm. The LV cavity is dilated. A focal portion of the mid anterior wall is akinetic. Overall LV function reveals an EF of 35-40%. No obvious mitral regurgitation is noted although power injection was not performed and mild regurgitation could be missed.  IMPRESSIONS: 1. Widely patent sequential left internal graft to the diagonal/LAD.  2. Widely patent saphenous vein  graft to the obtuse marginal with proximal 50% narrowing noted in the graft.  3. Total occlusion of the saphenous vein grafts to the right coronary, the ramus intermedius, and the diagonal.  4. Left ventricular dysfunction with inferobasal aneurysm and mid anterior wall region of akinesis. Overall EF is 35-40% .  5. Total occlusion of the native circulation.  Echo June 2020:   1. The left ventricle has mild-moderately reduced systolic function, with an ejection fraction of 40-45%. The cavity size was normal. There is mildly increased left ventricular wall thickness. Left ventricular diastolic function could not be evaluated  due to nondiagnostic images.  2. Overall LVF appears mildly reduced with EF estimated at 40-45% but this may be an underestimate due to poor acoustical windows in some views. There appears to be severe hypokinesis of the basal and mid inferoseptum but the inferior wall is not well  seen. Recommend definity contrast study for further assessement.  3. The right ventricle has normal systolic function. The cavity was normal. There is no increase in right ventricular wall thickness. Right ventricular systolic pressure could not be assessed.  4. Right atrial size was mildly dilated.  5. The aortic valve is tricuspid. Mild sclerosis of the aortic valve. Aortic valve regurgitation is trivial by color flow Doppler.  6. The interatrial septum appears to be lipomatous.  7. Recommend definity contrast study.   EKG:  EKG is not ordered today. The ekg ordered today demonstrates   Recent Labs: 08/13/2018: TSH 3.80 08/28/2018: B Natriuretic Peptide 441.0 09/13/2018: NT-Pro BNP 10,359 09/14/2018: ALT 15 09/20/2018: Hemoglobin 8.7; Platelets 157 09/25/2018: BUN 44; Creatinine, Ser 2.99; Magnesium 2.0; Potassium 4.4; Sodium 139   Lipid Panel    Component Value Date/Time   CHOL 133 06/04/2018 1149   TRIG 64 06/04/2018 1149   HDL 48 06/04/2018 1149   CHOLHDL 2.8 06/04/2018 1149    CHOLHDL 2.6 07/23/2015 0755   VLDL 10 07/23/2015 0755   LDLCALC 72 06/04/2018 1149     Wt Readings from Last 3 Encounters:  10/01/18 156 lb (70.8 kg)  09/25/18 158 lb 12.8 oz (72 kg)  09/20/18 151 lb 3.8 oz (68.6 kg)     Other studies Reviewed: Additional studies/ records that were reviewed today include: . Review of the above records demonstrates:    Assessment and Plan:   1. CAD s/p CABG with  angina: No recent chest pains. CAD stable by cath in 2015. Will continue statin and Imdur. Ronald [name] has not tolerated beta blockers due to bradycardia. No ASA  since Ronald [name] has been started on Eliquis.  No indication for ischemic testing.   2. Hyperlipidemia: Lipids are well controlled. Continue statin  3. HTN: BP is well controlled.   4. Ischemic cardiomyopathy/chronic systolic CHF: Ronald [name] does not tolerate Ace-inh or beta blockers. His weight is stable but his LE edema is worse.. I have had a long discussion today regarding diuresis and his kidney disease. Ronald [name] is approaching end of life and may be best to consider comfort care. I think trying daily Lasix 20 mg is worthwhile. Ronald [name] will call Nephrology today to arrange a visit with Dr. Maryjane Hurter to discuss Lasix dosing and renal failure. If the diuresis does not work, will need to consider Hospice. Will have him take Lasix 20 mg daily.   5. Atrial fibrillation: Continue Eliquis  Current medicines are reviewed at length with the patient today.  The patient does not have concerns regarding medicines.  The following changes have been made:  no change  Labs/ tests ordered today include:   No orders of the defined types were placed in this encounter.   Disposition:   FU with me in 3 months  Signed, Lauree Chandler, MD 10/01/2018 2:11 PM    [facility] [address], [place], [place]  [address] Phone: [phone]; Fax: [phone]

## 2018-10-02 ENCOUNTER — Other Ambulatory Visit: Payer: Self-pay | Admitting: *Deleted

## 2018-10-02 NOTE — Patient Outreach (Signed)
[facility]) Care Management  10/02/2018  Ronald [name] 05/11/1927 [id number]   Subjective: Telephone call to patient's home number, no answer, left HIPAA compliant voicemail message, and requested call back.   Objective: Per KPN (Knowledge Performance Now, point of care tool) and chart review, patient hospitalized 09/14/2018 -09/20/2018 for Acute renal failure (ARF), Anemia due to chronic kidney disease.  Patient also has a history of CAD status post CABG, Ischemic cardiomyopathy, Chronic atrial fibrillation, chronic kidney disease stage 3, anemia, and Prostate cancer with bladder outlet obstruction status post suprapubic catheter.      Assessment: Received Medicare EMMI General Discharge Red Flag Alert follow up referral on 09/26/2018.  Red Flag Alert Trigger, Day #1, patient answered yes to the following question: Lost interest in things?  [facility] EMMI follow up pending patient contact.      Plan: RNCM has sent unsuccessful outreach letter, [facility] pamphlet, handout: Know Before You Go, will call patient for 3rd telephone outreach attempt within 4 business days, [facility] EMMI follow up, and proceed with case closure, within 10 business days if no return call.     Ernisha Sorn H. Annia Friendly, BSN, [place] Management [facility] Telephonic CM Phone: [phone] Fax: [phone]

## 2018-10-03 ENCOUNTER — Other Ambulatory Visit: Payer: Self-pay | Admitting: *Deleted

## 2018-10-03 DIAGNOSIS — N179 Acute kidney failure, unspecified: Secondary | ICD-10-CM | POA: Diagnosis not present

## 2018-10-03 DIAGNOSIS — N189 Chronic kidney disease, unspecified: Secondary | ICD-10-CM | POA: Diagnosis not present

## 2018-10-03 NOTE — Patient Outreach (Signed)
[facility]) Care Management  10/03/2018  SYLAS TWOMBLY 19-May-1927 [id number]   Subjective: Received voicemail message from Marvene Staff, patient's wife, designated party release, states she is returning call for her husband Ann Groeneveld), and requested call back. Telephone call to patient's home number, no answer, left HIPAA compliant voicemail message, for Marvene Staff, for [employer], and requested call back.   Objective:Per KPN (Knowledge Performance Now, point of care tool) and chart review,patient hospitalized 09/14/2018 -09/20/2018 forAcute renal failure (ARF),Anemia due to chronic kidney disease. Patient also has a history of CAD status post CABG, Ischemic cardiomyopathy,Chronic atrial fibrillation, chronic kidney disease stage 3, anemia, andProstate cancer with bladder outlet obstruction status post suprapubic catheter.     Assessment: Received Medicare EMMI General Discharge Red Flag Alert follow up referral on 09/26/2018. Red Flag Alert Trigger, Day #1, patient answered yes to the following question: Lost interest in things?[facility] EMMI follow up pending patient contact.     Plan:RNCM has sent unsuccessful outreach letter, [facility] pamphlet, handout: Know Before You Go, will  proceed with case closure, within 10 business days if no return call.     Yen Wandell H. Annia Friendly, BSN, [place] Management [facility] Telephonic CM Phone: [phone] Fax: [phone]

## 2018-10-04 ENCOUNTER — Encounter (HOSPITAL_COMMUNITY): Payer: Self-pay | Admitting: *Deleted

## 2018-10-04 ENCOUNTER — Inpatient Hospital Stay (HOSPITAL_COMMUNITY): Payer: Medicare Other

## 2018-10-04 ENCOUNTER — Inpatient Hospital Stay (HOSPITAL_COMMUNITY)
Admission: EM | Admit: 2018-10-04 | Discharge: 2018-10-07 | DRG: 682 | Disposition: A | Discharge status: Hospice | Payer: Medicare Other | Attending: Internal Medicine | Admitting: Internal Medicine

## 2018-10-04 ENCOUNTER — Other Ambulatory Visit: Payer: Self-pay

## 2018-10-04 ENCOUNTER — Emergency Department (HOSPITAL_COMMUNITY): Payer: Medicare Other

## 2018-10-04 DIAGNOSIS — Z9359 Other cystostomy status: Secondary | ICD-10-CM

## 2018-10-04 DIAGNOSIS — Z515 Encounter for palliative care: Secondary | ICD-10-CM | POA: Diagnosis not present

## 2018-10-04 DIAGNOSIS — M546 Pain in thoracic spine: Secondary | ICD-10-CM | POA: Diagnosis not present

## 2018-10-04 DIAGNOSIS — K5790 Diverticulosis of intestine, part unspecified, without perforation or abscess without bleeding: Secondary | ICD-10-CM | POA: Diagnosis present

## 2018-10-04 DIAGNOSIS — N3289 Other specified disorders of bladder: Secondary | ICD-10-CM | POA: Diagnosis not present

## 2018-10-04 DIAGNOSIS — N179 Acute kidney failure, unspecified: Principal | ICD-10-CM | POA: Diagnosis present

## 2018-10-04 DIAGNOSIS — Z7189 Other specified counseling: Secondary | ICD-10-CM

## 2018-10-04 DIAGNOSIS — Z1159 Encounter for screening for other viral diseases: Secondary | ICD-10-CM | POA: Diagnosis not present

## 2018-10-04 DIAGNOSIS — J029 Acute pharyngitis, unspecified: Secondary | ICD-10-CM | POA: Diagnosis present

## 2018-10-04 DIAGNOSIS — I2510 Atherosclerotic heart disease of native coronary artery without angina pectoris: Secondary | ICD-10-CM | POA: Diagnosis present

## 2018-10-04 DIAGNOSIS — G8929 Other chronic pain: Secondary | ICD-10-CM | POA: Diagnosis not present

## 2018-10-04 DIAGNOSIS — E785 Hyperlipidemia, unspecified: Secondary | ICD-10-CM | POA: Diagnosis present

## 2018-10-04 DIAGNOSIS — Z8546 Personal history of malignant neoplasm of prostate: Secondary | ICD-10-CM | POA: Diagnosis not present

## 2018-10-04 DIAGNOSIS — M1990 Unspecified osteoarthritis, unspecified site: Secondary | ICD-10-CM | POA: Diagnosis present

## 2018-10-04 DIAGNOSIS — Z888 Allergy status to other drugs, medicaments and biological substances status: Secondary | ICD-10-CM

## 2018-10-04 DIAGNOSIS — N139 Obstructive and reflux uropathy, unspecified: Secondary | ICD-10-CM | POA: Diagnosis not present

## 2018-10-04 DIAGNOSIS — N1330 Unspecified hydronephrosis: Secondary | ICD-10-CM | POA: Diagnosis present

## 2018-10-04 DIAGNOSIS — K219 Gastro-esophageal reflux disease without esophagitis: Secondary | ICD-10-CM | POA: Diagnosis present

## 2018-10-04 DIAGNOSIS — Z882 Allergy status to sulfonamides status: Secondary | ICD-10-CM

## 2018-10-04 DIAGNOSIS — N320 Bladder-neck obstruction: Secondary | ICD-10-CM | POA: Diagnosis present

## 2018-10-04 DIAGNOSIS — I4819 Other persistent atrial fibrillation: Secondary | ICD-10-CM | POA: Diagnosis present

## 2018-10-04 DIAGNOSIS — Z91048 Other nonmedicinal substance allergy status: Secondary | ICD-10-CM

## 2018-10-04 DIAGNOSIS — H9190 Unspecified hearing loss, unspecified ear: Secondary | ICD-10-CM | POA: Diagnosis present

## 2018-10-04 DIAGNOSIS — R0602 Shortness of breath: Secondary | ICD-10-CM

## 2018-10-04 DIAGNOSIS — F0390 Unspecified dementia without behavioral disturbance: Secondary | ICD-10-CM | POA: Diagnosis present

## 2018-10-04 DIAGNOSIS — I5023 Acute on chronic systolic (congestive) heart failure: Secondary | ICD-10-CM | POA: Diagnosis present

## 2018-10-04 DIAGNOSIS — Z85820 Personal history of malignant melanoma of skin: Secondary | ICD-10-CM

## 2018-10-04 DIAGNOSIS — I255 Ischemic cardiomyopathy: Secondary | ICD-10-CM | POA: Diagnosis present

## 2018-10-04 DIAGNOSIS — Z961 Presence of intraocular lens: Secondary | ICD-10-CM | POA: Diagnosis present

## 2018-10-04 DIAGNOSIS — I480 Paroxysmal atrial fibrillation: Secondary | ICD-10-CM | POA: Diagnosis not present

## 2018-10-04 DIAGNOSIS — Z20828 Contact with and (suspected) exposure to other viral communicable diseases: Secondary | ICD-10-CM | POA: Diagnosis not present

## 2018-10-04 DIAGNOSIS — N184 Chronic kidney disease, stage 4 (severe): Secondary | ICD-10-CM

## 2018-10-04 DIAGNOSIS — Z972 Presence of dental prosthetic device (complete) (partial): Secondary | ICD-10-CM

## 2018-10-04 DIAGNOSIS — Z87440 Personal history of urinary (tract) infections: Secondary | ICD-10-CM

## 2018-10-04 DIAGNOSIS — I10 Essential (primary) hypertension: Secondary | ICD-10-CM | POA: Diagnosis not present

## 2018-10-04 DIAGNOSIS — N183 Chronic kidney disease, stage 3 (moderate): Secondary | ICD-10-CM | POA: Diagnosis present

## 2018-10-04 DIAGNOSIS — Z8249 Family history of ischemic heart disease and other diseases of the circulatory system: Secondary | ICD-10-CM

## 2018-10-04 DIAGNOSIS — Z87891 Personal history of nicotine dependence: Secondary | ICD-10-CM

## 2018-10-04 DIAGNOSIS — I1310 Hypertensive heart and chronic kidney disease without heart failure, with stage 1 through stage 4 chronic kidney disease, or unspecified chronic kidney disease: Secondary | ICD-10-CM | POA: Diagnosis present

## 2018-10-04 DIAGNOSIS — J9811 Atelectasis: Secondary | ICD-10-CM | POA: Diagnosis not present

## 2018-10-04 DIAGNOSIS — I5042 Chronic combined systolic (congestive) and diastolic (congestive) heart failure: Secondary | ICD-10-CM | POA: Diagnosis present

## 2018-10-04 DIAGNOSIS — Z974 Presence of external hearing-aid: Secondary | ICD-10-CM

## 2018-10-04 DIAGNOSIS — I252 Old myocardial infarction: Secondary | ICD-10-CM

## 2018-10-04 DIAGNOSIS — Z66 Do not resuscitate: Secondary | ICD-10-CM | POA: Diagnosis present

## 2018-10-04 DIAGNOSIS — D631 Anemia in chronic kidney disease: Secondary | ICD-10-CM | POA: Diagnosis present

## 2018-10-04 DIAGNOSIS — N189 Chronic kidney disease, unspecified: Secondary | ICD-10-CM | POA: Diagnosis present

## 2018-10-04 DIAGNOSIS — M4854XA Collapsed vertebra, not elsewhere classified, thoracic region, initial encounter for fracture: Secondary | ICD-10-CM | POA: Diagnosis present

## 2018-10-04 DIAGNOSIS — Z951 Presence of aortocoronary bypass graft: Secondary | ICD-10-CM

## 2018-10-04 DIAGNOSIS — Z7901 Long term (current) use of anticoagulants: Secondary | ICD-10-CM

## 2018-10-04 DIAGNOSIS — Z9842 Cataract extraction status, left eye: Secondary | ICD-10-CM

## 2018-10-04 DIAGNOSIS — Z87442 Personal history of urinary calculi: Secondary | ICD-10-CM

## 2018-10-04 DIAGNOSIS — Z79899 Other long term (current) drug therapy: Secondary | ICD-10-CM

## 2018-10-04 DIAGNOSIS — I130 Hypertensive heart and chronic kidney disease with heart failure and stage 1 through stage 4 chronic kidney disease, or unspecified chronic kidney disease: Secondary | ICD-10-CM | POA: Diagnosis not present

## 2018-10-04 DIAGNOSIS — I517 Cardiomegaly: Secondary | ICD-10-CM | POA: Diagnosis not present

## 2018-10-04 DIAGNOSIS — Z884 Allergy status to anesthetic agent status: Secondary | ICD-10-CM

## 2018-10-04 DIAGNOSIS — S22000A Wedge compression fracture of unspecified thoracic vertebra, initial encounter for closed fracture: Secondary | ICD-10-CM

## 2018-10-04 DIAGNOSIS — Z9841 Cataract extraction status, right eye: Secondary | ICD-10-CM

## 2018-10-04 LAB — COMPREHENSIVE METABOLIC PANEL
ALT: 22 U/L (ref 0–44)
AST: 27 U/L (ref 15–41)
Albumin: 3.4 g/dL — ABNORMAL LOW (ref 3.5–5.0)
Alkaline Phosphatase: 64 U/L (ref 38–126)
Anion gap: 12 (ref 5–15)
BUN: 60 mg/dL — ABNORMAL HIGH (ref 8–23)
CO2: 20 mmol/L — ABNORMAL LOW (ref 22–32)
Calcium: 8.7 mg/dL — ABNORMAL LOW (ref 8.9–10.3)
Chloride: 103 mmol/L (ref 98–111)
Creatinine, Ser: 4.79 mg/dL — ABNORMAL HIGH (ref 0.61–1.24)
GFR calc Af Amer: 12 mL/min — ABNORMAL LOW (ref >60)
GFR calc non Af Amer: 10 mL/min — ABNORMAL LOW (ref >60)
Glucose, Bld: 117 mg/dL — ABNORMAL HIGH (ref 70–99)
Potassium: 4.4 mmol/L (ref 3.5–5.1)
Sodium: 135 mmol/L (ref 135–145)
Total Bilirubin: 0.6 mg/dL (ref 0.3–1.2)
Total Protein: 5.8 g/dL — ABNORMAL LOW (ref 6.5–8.1)

## 2018-10-04 LAB — BASIC METABOLIC PANEL
Anion gap: 13 (ref 5–15)
BUN: 60 mg/dL — ABNORMAL HIGH (ref 8–23)
CO2: 20 mmol/L — ABNORMAL LOW (ref 22–32)
Calcium: 8.8 mg/dL — ABNORMAL LOW (ref 8.9–10.3)
Chloride: 106 mmol/L (ref 98–111)
Creatinine, Ser: 4.8 mg/dL — ABNORMAL HIGH (ref 0.61–1.24)
GFR calc Af Amer: 12 mL/min — ABNORMAL LOW (ref >60)
GFR calc non Af Amer: 10 mL/min — ABNORMAL LOW (ref >60)
Glucose, Bld: 120 mg/dL — ABNORMAL HIGH (ref 70–99)
Potassium: 4.4 mmol/L (ref 3.5–5.1)
Sodium: 139 mmol/L (ref 135–145)

## 2018-10-04 LAB — CBC
HCT: 25.3 % — ABNORMAL LOW (ref 39.0–52.0)
Hemoglobin: 8.1 g/dL — ABNORMAL LOW (ref 13.0–17.0)
MCH: 31.5 pg (ref 26.0–34.0)
MCHC: 32 g/dL (ref 30.0–36.0)
MCV: 98.4 fL (ref 80.0–100.0)
Platelets: 115 10*3/uL — ABNORMAL LOW (ref 150–400)
RBC: 2.57 MIL/uL — ABNORMAL LOW (ref 4.22–5.81)
RDW: 14.4 % (ref 11.5–15.5)
WBC: 6.2 10*3/uL (ref 4.0–10.5)
nRBC: 0 % (ref 0.0–0.2)

## 2018-10-04 LAB — SARS CORONAVIRUS 2 BY RT PCR (HOSPITAL ORDER, PERFORMED IN ~~LOC~~ HOSPITAL LAB): SARS Coronavirus 2: NEGATIVE

## 2018-10-04 LAB — BRAIN NATRIURETIC PEPTIDE: B Natriuretic Peptide: 545.1 pg/mL — ABNORMAL HIGH (ref 0.0–100.0)

## 2018-10-04 MED ORDER — ONDANSETRON HCL 4 MG PO TABS: 4.0000 mg | ORAL_TABLET | Freq: Four times a day (QID) | ORAL | Status: DC | PRN

## 2018-10-04 MED ORDER — SODIUM CHLORIDE 0.9% FLUSH: 3.0000 mL | Freq: Once | INTRAVENOUS | Status: DC

## 2018-10-04 MED ORDER — ATORVASTATIN CALCIUM 40 MG PO TABS
40.0000 mg | ORAL_TABLET | Freq: Every evening | ORAL | Status: DC
  Administered 2018-10-04 – 2018-10-07 (×4): 40 mg via ORAL
  Filled 2018-10-04 (×4): qty 1

## 2018-10-04 MED ORDER — ACETAMINOPHEN 650 MG RE SUPP: 650.0000 mg | Freq: Four times a day (QID) | RECTAL | Status: DC | PRN

## 2018-10-04 MED ORDER — APIXABAN 2.5 MG PO TABS
2.5000 mg | ORAL_TABLET | Freq: Two times a day (BID) | ORAL | Status: DC
  Administered 2018-10-04 – 2018-10-07 (×6): 2.5 mg via ORAL
  Filled 2018-10-04 (×6): qty 1

## 2018-10-04 MED ORDER — OXYCODONE HCL 5 MG PO TABS
5.0000 mg | ORAL_TABLET | ORAL | Status: DC | PRN
  Administered 2018-10-05: 10:00:00 5 mg via ORAL
  Filled 2018-10-04: qty 1

## 2018-10-04 MED ORDER — PANTOPRAZOLE SODIUM 40 MG PO TBEC
40.0000 mg | DELAYED_RELEASE_TABLET | Freq: Every day | ORAL | Status: DC
  Administered 2018-10-05 – 2018-10-07 (×3): 40 mg via ORAL
  Filled 2018-10-04 (×3): qty 1

## 2018-10-04 MED ORDER — SORBITOL 70 % SOLN
30.0000 mL | Status: DC | PRN
  Filled 2018-10-04: qty 30

## 2018-10-04 MED ORDER — ONDANSETRON HCL 4 MG/2ML IJ SOLN: 4.0000 mg | Freq: Four times a day (QID) | INTRAMUSCULAR | Status: DC | PRN

## 2018-10-04 MED ORDER — DARBEPOETIN ALFA 60 MCG/0.3ML IJ SOSY
60.0000 ug | PREFILLED_SYRINGE | Freq: Once | INTRAMUSCULAR | Status: AC
  Administered 2018-10-04: 60 ug via SUBCUTANEOUS
  Filled 2018-10-04: qty 0.3

## 2018-10-04 MED ORDER — FUROSEMIDE 10 MG/ML IJ SOLN
60.0000 mg | Freq: Once | INTRAMUSCULAR | Status: AC
  Administered 2018-10-04: 60 mg via INTRAVENOUS
  Filled 2018-10-04: qty 6

## 2018-10-04 MED ORDER — CAMPHOR-MENTHOL 0.5-0.5 % EX LOTN
1.0000 "application " | TOPICAL_LOTION | Freq: Three times a day (TID) | CUTANEOUS | Status: DC | PRN
  Filled 2018-10-04: qty 222

## 2018-10-04 MED ORDER — SODIUM CHLORIDE 0.9 % IV SOLN
510.0000 mg | Freq: Once | INTRAVENOUS | Status: AC
  Administered 2018-10-04: 510 mg via INTRAVENOUS
  Filled 2018-10-04: qty 17

## 2018-10-04 MED ORDER — ZOLPIDEM TARTRATE 5 MG PO TABS: 5.0000 mg | ORAL_TABLET | Freq: Every evening | ORAL | Status: DC | PRN

## 2018-10-04 MED ORDER — FUROSEMIDE 10 MG/ML IJ SOLN: 40.0000 mg | Freq: Two times a day (BID) | INTRAMUSCULAR | Status: DC

## 2018-10-04 MED ORDER — NEPRO/CARBSTEADY PO LIQD: 237.0000 mL | Freq: Three times a day (TID) | ORAL | Status: DC | PRN

## 2018-10-04 MED ORDER — CALCIUM CARBONATE ANTACID 1250 MG/5ML PO SUSP
500.0000 mg | Freq: Four times a day (QID) | ORAL | Status: DC | PRN
  Filled 2018-10-04: qty 5

## 2018-10-04 MED ORDER — ACETAMINOPHEN 325 MG PO TABS: 650.0000 mg | ORAL_TABLET | Freq: Four times a day (QID) | ORAL | Status: DC | PRN

## 2018-10-04 MED ORDER — DOCUSATE SODIUM 283 MG RE ENEM
1.0000 | ENEMA | RECTAL | Status: DC | PRN
  Filled 2018-10-04: qty 1

## 2018-10-04 MED ORDER — ISOSORBIDE MONONITRATE ER 30 MG PO TB24
15.0000 mg | ORAL_TABLET | Freq: Every day | ORAL | Status: DC
  Administered 2018-10-05 – 2018-10-07 (×3): 15 mg via ORAL
  Filled 2018-10-04 (×3): qty 1

## 2018-10-04 MED ORDER — HYDROXYZINE HCL 25 MG PO TABS: 25.0000 mg | ORAL_TABLET | Freq: Three times a day (TID) | ORAL | Status: DC | PRN

## 2018-10-04 NOTE — ED Notes (Signed)
ED TO INPATIENT HANDOFF REPORT  ED Nurse Name and Phone #: Annie Main [phone]  B Name/Age/Gender Ronald [name] 85 y.o. male Room/Bed: 058C/058C  Code Status   Code Status: DNR  Home/SNF/Other Home Patient oriented to: self, place, time and situation Is this baseline? Yes   Triage Complete: Triage complete  Chief Complaint kidney pain  Triage Note To ED for eval after being called and informed that his blood work 'is worse than before' and he needed to come to the hospital to get fluid off. Pt states his legs are swollen. 2+ edema noted. Pt is alert and oriented. States he lives with his wife, Ronald [name]. Speaks in full complete sentences.    Allergies Allergies  Allergen Reactions  . Lidocaine Shortness Of Breath and Swelling    Mouth swelling   . Pantoprazole Other (See Comments)    Made the patient lightheaded and dizzy  . Nitrofurantoin Hives and Other (See Comments)    Also made the patient lightheaded and dizzy  . Sulfa Antibiotics Hives and Itching  . Tape Other (See Comments)    TAPE WILL TEAR THE SKIN!!!    Level of Care/Admitting Diagnosis ED Disposition    ED Disposition Condition Comment   Admit  Hospital Area: [facility] [[id number]]  Level of Care: Telemetry Cardiac [[id number]]  Covid Evaluation: Asymptomatic Screening Protocol (No Symptoms)  Diagnosis: Cardiorenal syndrome [[id number]]  Admitting Physician: Karmen Bongo [[id number]]  Attending Physician: Karmen Bongo [[id number]]  Estimated length of stay: 3 - 4 days  Certification:: I certify this patient will need inpatient services for at least 2 midnights  PT Class (Do Not Modify): Inpatient [101]  PT Acc Code (Do Not Modify): Private [1]       B Medical/Surgery History Past Medical History:  Diagnosis Date  . Anemia 09/14/2018  . Arthritis    "joints ache" (01/02/2014)  . At risk for sleep apnea    STOP-BANG= 4    SENT TO PCP 12-23-2013  . Bladder calculi   . Bradycardia   . Chronic  cystitis   . Chronic systolic CHF (congestive heart failure) (HCC)    a. EF improved to 50-55% in 2018, previously 35-40%  . CKD (chronic kidney disease), stage II   . Coronary artery disease CARDIOLOGIST-  DR Angelena Form   a. CAD s/p CABG in 1989. b. redo bypass in 1997. c. last cath 2015 - med rx.  . Diverticulosis   . GERD (gastroesophageal reflux disease)   . History of Bell's palsy    RIGHT SIDE-- NO RESIDUAL  . Hx of dizziness   . Hyperlipidemia   . Hypertension    "not anymore" (01/02/2014)  . Ischemic cardiomyopathy   . Kidney stones "years ago"   "passed them"  . Melanoma of ear ([place])    "right"  . Mild dementia ([place])   . Myocardial infarction ([place]) 1986; 1997  . Nocturia   . OAB (overactive bladder)   . Persistent atrial fibrillation   . Prostate cancer ([place]) 1998   S/P  [place]; [place]  . S/P CABG (coronary artery bypass graft)    [place]  . Sepsis due to urinary tract infection ([place])   . Urethral stricture   . UTI (urinary tract infection) 09/2015  . Wears hearing aid    bilateral  . Wears partial dentures    Past Surgical History:  Procedure Laterality Date  . CARDIAC CATHETERIZATION  02-22-2005  dr Vidal Schwalbe   mild to moderate lv dysfunction with inferobasilar akinesis/  totally occluded SVG to Intermediate Diagonal and SVG to PDA and RCA branches, totally occluded native coronary circulation with diffuse disease pLAD diagonal system with potentially could be ischemic/  patent SVG to OM with collaterals to dRCA and patent LIMA to LAD and diagonal systemss  . CARDIAC CATHETERIZATION  08-28-2013  DR Daneen Schick   widely patent sequential left internal graft to the diagonal/LAD, widely patent SVG to OM with proximal 50% narrowing noted in the graft, total occlusion SVG's to RCA, RI, and the Diagonal/ LV dysfunction with inferobasal aneurysm and mid anterior wall region of akinesis/  overall EF 35-40%/  Total occlusion of the  navtive circulation/  no significant change compared to 2006 cath  . CARDIOVASCULAR STRESS TEST  08-01-2011  dr Angelena Form   inferior scar and possible soft tissue attenuation with minimal peri-infarct ischemia, small region of anterior ischemia and scar/  LVEF 53% LV wall motion with inferior hypokinesis/ no significant change from scan july 2011  . CATARACT EXTRACTION W/ INTRAOCULAR LENS  IMPLANT, BILATERAL Bilateral 2007  . CORONARY ARTERY BYPASS GRAFT  11/ [place]-- 6 vessel/  1997 Re-do 5 vessel  . CYSTOSCOPY WITH URETHRAL DILATATION N/A 12/25/2013   Procedure: CYSTOSCOPY WITH URETHRAL DILATATION, WITH BIOPSY;  Surgeon: Bernestine Amass, MD;  Location: [facility];  Service: Urology;  Laterality: N/A;  . CYSTOSCOPY WITH URETHRAL DILATATION N/A 12/22/2014   Procedure: CYSTOSCOPY WITH URETHRAL DILATATION;  Surgeon: Rana Snare, MD;  Location: [facility];  Service: Urology;  Laterality: N/A;  BALLOON DILATION CATHETER    . EUS  10/05/2011   Procedure: ESOPHAGEAL ENDOSCOPIC ULTRASOUND (EUS) RADIAL;  Surgeon: Arta Silence, MD;  [facility];  Service: Endoscopy;  Laterality: N/A;  . INSERTION OF SUPRAPUBIC CATHETER N/A 12/22/2014   Procedure: INSERTION OF SUPRAPUBIC CATHETER ;  Surgeon: Rana Snare, MD;  [facility];  Service: Urology;  Laterality: N/A;  . LAPAROSCOPIC CHOLECYSTECTOMY  12-23-2005  . LEFT HEART CATHETERIZATION WITH CORONARY/GRAFT ANGIOGRAM N/A 08/28/2013   Procedure: LEFT HEART CATHETERIZATION WITH Beatrix Fetters;  Surgeon: Sinclair Grooms, MD;  Location: [facility] CATH LAB;  Service: Cardiovascular;  Laterality: N/A;  . MELANOMA EXCISION  X 1   "ear"  . RADIOACTIVE PROSTATE SEED IMPLANTS  1998  . SKIN CANCER EXCISION  X 2   "top of head"  . TONSILLECTOMY AND ADENOIDECTOMY  1954     A IV Location/Drains/Wounds Patient Lines/Drains/Airways Status   Active Line/Drains/Airways    Name:   Placement date:   Placement time:   Site:    Days:   Peripheral IV 10/04/18 Right Forearm   10/04/18    1212    Forearm   less than 1   Suprapubic Catheter Latex 16 Fr.   03/06/18    0920    Latex   212   Suprapubic Catheter 16 Fr.   09/17/18    1500    -   17          Intake/Output Last 24 hours No intake or output data in the 24 hours ending 10/04/18 1713  Labs/Imaging Results for orders placed or performed during the hospital encounter of 10/04/18 (from the past 48 hour(s))  Basic metabolic panel     Status: Abnormal   Collection Time: 10/04/18 11:47 AM  Result Value Ref Range   Sodium 139 135 - 145 mmol/L  Potassium 4.4 3.5 - 5.1 mmol/L   Chloride 106 98 - 111 mmol/L   CO2 20 (L) 22 - 32 mmol/L   Glucose, Bld 120 (H) 70 - 99 mg/dL   BUN 60 (H) 8 - 23 mg/dL   Creatinine, Ser 4.80 (H) 0.61 - 1.24 mg/dL   Calcium 8.8 (L) 8.9 - 10.3 mg/dL   GFR calc non Af Amer 10 (L) >60 mL/min   GFR calc Af Amer 12 (L) >60 mL/min   Anion gap 13 5 - 15    Comment: Performed at [facility] [address] [place], [place] [address]  CBC     Status: Abnormal   Collection Time: 10/04/18 11:47 AM  Result Value Ref Range   WBC 6.2 4.0 - 10.5 K/uL   RBC 2.57 (L) 4.22 - 5.81 MIL/uL   Hemoglobin 8.1 (L) 13.0 - 17.0 g/dL   HCT 25.3 (L) 39.0 - 52.0 %   MCV 98.4 80.0 - 100.0 fL   MCH 31.5 26.0 - 34.0 pg   MCHC 32.0 30.0 - 36.0 g/dL   RDW 14.4 11.5 - 15.5 %   Platelets 115 (L) 150 - 400 K/uL    Comment: REPEATED TO VERIFY PLATELET COUNT CONFIRMED BY SMEAR Immature Platelet Fraction may be clinically indicated, consider ordering this additional test [id number]    nRBC 0.0 0.0 - 0.2 %    Comment: Performed at [facility], [place] [address], [place], [place] [address]  Brain natriuretic peptide     Status: Abnormal   Collection Time: 10/04/18 12:52 PM  Result Value Ref Range   B Natriuretic Peptide 545.1 (H) 0.0 - 100.0 pg/mL    Comment: Performed at [facility] [address], [place], [place] [address]   Comprehensive metabolic panel     Status: Abnormal   Collection Time: 10/04/18 12:52 PM  Result Value Ref Range   Sodium 135 135 - 145 mmol/L   Potassium 4.4 3.5 - 5.1 mmol/L   Chloride 103 98 - 111 mmol/L   CO2 20 (L) 22 - 32 mmol/L   Glucose, Bld 117 (H) 70 - 99 mg/dL   BUN 60 (H) 8 - 23 mg/dL   Creatinine, Ser 4.79 (H) 0.61 - 1.24 mg/dL   Calcium 8.7 (L) 8.9 - 10.3 mg/dL   Total Protein 5.8 (L) 6.5 - 8.1 g/dL   Albumin 3.4 (L) 3.5 - 5.0 g/dL   AST 27 15 - 41 U/L   ALT 22 0 - 44 U/L   Alkaline Phosphatase 64 38 - 126 U/L   Total Bilirubin 0.6 0.3 - 1.2 mg/dL   GFR calc non Af Amer 10 (L) >60 mL/min   GFR calc Af Amer 12 (L) >60 mL/min   Anion gap 12 5 - 15    Comment: Performed at [facility], [place] [address], [place], [place] [address]   Dg Chest 2 View  Result Date: 10/04/2018 CLINICAL DATA:  SOB,SWELLING IN LEGS,FLUID OVERLOAD,HX CHF,HTN,MI EXAM: CHEST - 2 VIEW COMPARISON:  Prior studies including 09/14/2018 FINDINGS: Median sternotomy and CABG. The heart is enlarged. There is shallow lung inflation. Minimal atelectasis is identified at both lung bases. There are no focal consolidations, pleural effusions, or pulmonary edema. Numerous thoracic compression fractures. IMPRESSION: 1. Cardiomegaly. 2. Bibasilar atelectasis. 3. Lungs otherwise clear. Electronically Signed   By: Nolon Nations M.D.   On: 10/04/2018 12:59    Pending Labs [employer] (From admission, onward)    Start  Ordered   10/05/18 6701  Basic metabolic panel  Tomorrow morning,   R     10/04/18 1652   10/05/18 0500  CBC  Tomorrow morning,   R     10/04/18 1652   10/04/18 1456  SARS Coronavirus 2 (CEPHEID - Performed in [place] hospital lab), Hosp Order  (Asymptomatic Patients Labs)  Once,   STAT    Question:  Rule Out  Answer:  Yes   10/04/18 1456          Vitals/Pain Today's Vitals   10/04/18 1430 10/04/18 1500 10/04/18 1525 10/04/18 1552  BP: (!) 146/82 (!) 149/74 (!) 149/80 (!)  147/74  Pulse: 66 70 70 71  Resp: 16 16 16 16   Temp:      TempSrc:      SpO2: 98% 98% 98% 98%  Weight:      Height:      PainSc:        Isolation Precautions No active isolations  Medications Medications  sodium chloride flush (NS) 0.9 % injection 3 mL (has no administration in time range)  atorvastatin (LIPITOR) tablet 40 mg (has no administration in time range)  isosorbide mononitrate (IMDUR) 24 hr tablet 15 mg (has no administration in time range)  pantoprazole (PROTONIX) EC tablet 40 mg (has no administration in time range)  apixaban (ELIQUIS) tablet 2.5 mg (has no administration in time range)  acetaminophen (TYLENOL) tablet 650 mg (has no administration in time range)    Or  acetaminophen (TYLENOL) suppository 650 mg (has no administration in time range)  zolpidem (AMBIEN) tablet 5 mg (has no administration in time range)  sorbitol 70 % solution 30 mL (has no administration in time range)  docusate sodium (ENEMEEZ) enema 283 mg (has no administration in time range)  ondansetron (ZOFRAN) tablet 4 mg (has no administration in time range)    Or  ondansetron (ZOFRAN) injection 4 mg (has no administration in time range)  camphor-menthol (SARNA) lotion 1 application (has no administration in time range)    And  hydrOXYzine (ATARAX/VISTARIL) tablet 25 mg (has no administration in time range)  calcium carbonate (dosed in mg elemental calcium) suspension 500 mg of elemental calcium (has no administration in time range)  feeding supplement (NEPRO CARB STEADY) liquid 237 mL (has no administration in time range)  furosemide (LASIX) injection 40 mg (has no administration in time range)    Mobility walks with person assist Moderate fall risk   Focused Assessments Cardiac Assessment Handoff:  Cardiac Rhythm: Atrial flutter, Bundle branch block Lab Results  Component Value Date   CKTOTAL 98 10/14/2015   TROPONINI <0.03 09/14/2018   No results found for: DDIMER Does the  Patient currently have chest pain? No     R Recommendations: See Admitting Provider Note  Report given to:   Additional Notes:

## 2018-10-04 NOTE — Consult Note (Signed)
[facility] Renal Consultation Note  Requesting MD: Little  Indication for Consultation:  AKI, CKD  Chief complaint: "my kidneys I guess"  HPI:  Ronald [name] is a 85 y.o. male with a history of chronic systolic CHF who presented to the hospital with reported pain.  He had experienced AKI secondary to obstruction during a recent admission with discharge Cr 2.88.  He initially had elected not to follow-up with nephrology with his wife reported to our office that they were considering palliative care consult instead.  He was seen by cardiology and given overload was initiated on Lasix and recommended to follow-up with nephrology.  Pre-appt labs demonstrated his Cr had worsened to 4.5.  Spoke with his wife by phone re: the same earlier today and we had planned to see him in follow-up tomorrow; she then called back to the office reporting pt with uncontrolled pain and swelling and he was directed to the ER.  On arrival the patient was in no acute distress.  He states that he had a bad night last night with severe leg cramps that were "awful."  He states that he has some sharp pain when he takes a deep breath.  He has also had swelling.  His suprapubic catheter has been draining.  He was not aware that his wife had called the office.  Denies nausea.   I called his wife.  She states that with the way he is feeling right now she was worried that he needed to come to the ER.  When I asked her to elaborate she states that he is always in pain (leg pain, abdominal pain) and he complains to her of dizziness, nausea, and leg cramps which make him "miserable".  He had told her of pains "shooting all up his back" when he called her from the hospital.  She has noted him to be short of breath as well.  Flushing the catheter is so painful that he is miserable and hasn't allowed her to do it.  He has stated to her "If I can't have some peace, I hope the Reita Cliche will take me."  She is understandably upset by  his discomfort and would like for him to have some relief.   SARS Coronavirus 2 negative.   PMHx:   Past Medical History:  Diagnosis Date  . Anemia 09/14/2018  . Arthritis    "joints ache" (01/02/2014)  . At risk for sleep apnea    STOP-BANG= 4    SENT TO PCP 12-23-2013  . Bladder calculi   . Bradycardia   . Chronic cystitis   . Chronic systolic CHF (congestive heart failure) (HCC)    a. EF improved to 50-55% in 2018, previously 35-40%  . CKD (chronic kidney disease), stage II   . Coronary artery disease CARDIOLOGIST-  DR Angelena Form   a. CAD s/p CABG in 1989. b. redo bypass in 1997. c. last cath 2015 - med rx.  . Diverticulosis   . GERD (gastroesophageal reflux disease)   . History of Bell's palsy    RIGHT SIDE-- NO RESIDUAL  . Hx of dizziness   . Hyperlipidemia   . Hypertension    "not anymore" (01/02/2014)  . Ischemic cardiomyopathy   . Kidney stones "years ago"   "passed them"  . Melanoma of ear ([place])    "right"  . Mild dementia ([place])   . Myocardial infarction ([place]) 1986; 1997  . Nocturia   . OAB (overactive bladder)   . Persistent atrial  fibrillation   . Prostate cancer ([place]) 1998   S/P  [place]; [place]  . S/P CABG (coronary artery bypass graft)    [place]  . Sepsis due to urinary tract infection ([place])   . Urethral stricture   . UTI (urinary tract infection) 09/2015  . Wears hearing aid    bilateral  . Wears partial dentures     Past Surgical History:  Procedure Laterality Date  . CARDIAC CATHETERIZATION  02-22-2005  dr Vidal Schwalbe   mild to moderate lv dysfunction with inferobasilar akinesis/  totally occluded SVG to Intermediate Diagonal and SVG to PDA and RCA branches, totally occluded native coronary circulation with diffuse disease pLAD diagonal system with potentially could be ischemic/  patent SVG to OM with collaterals to dRCA and patent LIMA to LAD and diagonal systemss  . CARDIAC CATHETERIZATION  08-28-2013  DR  Daneen Schick   widely patent sequential left internal graft to the diagonal/LAD, widely patent SVG to OM with proximal 50% narrowing noted in the graft, total occlusion SVG's to RCA, RI, and the Diagonal/ LV dysfunction with inferobasal aneurysm and mid anterior wall region of akinesis/  overall EF 35-40%/  Total occlusion of the navtive circulation/  no significant change compared to 2006 cath  . CARDIOVASCULAR STRESS TEST  08-01-2011  dr Angelena Form   inferior scar and possible soft tissue attenuation with minimal peri-infarct ischemia, small region of anterior ischemia and scar/  LVEF 53% LV wall motion with inferior hypokinesis/ no significant change from scan july 2011  . CATARACT EXTRACTION W/ INTRAOCULAR LENS  IMPLANT, BILATERAL Bilateral 2007  . CORONARY ARTERY BYPASS GRAFT  11/ [place]-- 6 vessel/  1997 Re-do 5 vessel  . CYSTOSCOPY WITH URETHRAL DILATATION N/A 12/25/2013   Procedure: CYSTOSCOPY WITH URETHRAL DILATATION, WITH BIOPSY;  Surgeon: Bernestine Amass, MD;  Location[facility];  Service: Urology;  Laterality: N/A;  . CYSTOSCOPY WITH URETHRAL DILATATION N/A 12/22/2014   Procedure: CYSTOSCOPY WITH URETHRAL DILATATION;  Surgeon: Rana Snare, MD;  [facility];  Service: Urology;  Laterality: N/A;  BALLOON DILATION CATHETER    . EUS  10/05/2011   Procedure: ESOPHAGEAL ENDOSCOPIC ULTRASOUND (EUS) RADIAL;  Surgeon: Arta Silence, MD;  [facility];  Service: Endoscopy;  Laterality: N/A;  . INSERTION OF SUPRAPUBIC CATHETER N/A 12/22/2014   Procedure: INSERTION OF SUPRAPUBIC CATHETER ;  Surgeon: Rana Snare, MD;  [facility];  Service: Urology;  Laterality: N/A;  . LAPAROSCOPIC CHOLECYSTECTOMY  12-23-2005  . LEFT HEART CATHETERIZATION WITH CORONARY/GRAFT ANGIOGRAM N/A 08/28/2013   Procedure: LEFT HEART CATHETERIZATION WITH Beatrix Fetters;  Surgeon: Sinclair Grooms, MD;  Location: [facility] CATH LAB;  Service: Cardiovascular;  Laterality:  N/A;  . MELANOMA EXCISION  X 1   "ear"  . RADIOACTIVE PROSTATE SEED IMPLANTS  1998  . SKIN CANCER EXCISION  X 2   "top of head"  . TONSILLECTOMY AND ADENOIDECTOMY  1954    Family Hx:  Family History  Problem Relation Age of Onset  . Heart disease Mother   . Diabetes Father   . Heart disease Brother     Social History:  reports that he quit smoking about 34 years ago. His smoking use included cigars. He quit after 40.00 years of use. He quit smokeless tobacco use about 5 years ago.  His smokeless tobacco use included chew. He reports that he does not  drink alcohol or use drugs.  Allergies:  Allergies  Allergen Reactions  . Lidocaine Shortness Of Breath and Swelling    Mouth swelling   . Pantoprazole Other (See Comments)    Made the patient lightheaded and dizzy  . Nitrofurantoin Hives and Other (See Comments)    Also made the patient lightheaded and dizzy  . Sulfa Antibiotics Hives and Itching  . Tape Other (See Comments)    TAPE WILL TEAR THE SKIN!!!    Medications: Prior to Admission medications   Medication Sig Start Date End Date Taking? Authorizing Provider  acetaminophen (TYLENOL) 500 MG tablet Take 500 mg by mouth every 6 (six) hours as needed for mild pain, fever or headache.    Yes [provider]  apixaban (ELIQUIS) 5 MG TABS tablet Take 2.5 mg by mouth 2 (two) times daily.   Yes [provider]  atorvastatin (LIPITOR) 80 MG tablet Take 0.5 tablets (40 mg total) by mouth every evening. 05/24/17  Yes Marletta Lor, MD  Calcium Carbonate Antacid (TUMS PO) Take 1-3 tablets by mouth daily as needed (for gas or indigestion).    Yes [provider]  furosemide (LASIX) 20 MG tablet Take 1 tablet (20 mg total) by mouth daily. 10/01/18 09/26/19 Yes Burnell Blanks, MD  isosorbide mononitrate (IMDUR) 30 MG 24 hr tablet Take 0.5 tablets (15 mg total) by mouth daily. 09/13/18 12/12/18 Yes Isaiah Serge, NP  meclizine (ANTIVERT) 12.5 MG  tablet Take 1 tablet (12.5 mg total) by mouth 3 (three) times daily as needed for dizziness. 06/11/18  Yes Isaac Bliss, [name], MD  Multiple Vitamin (MULTIVITAMIN WITH MINERALS) TABS Take 1 tablet by mouth daily with breakfast.    Yes [provider]  nitroGLYCERIN (NITROSTAT) 0.4 MG SL tablet Place 0.4 mg under the tongue every 5 (five) minutes as needed for chest pain.    Yes [provider]  omeprazole (PRILOSEC) 40 MG capsule Take 1 capsule (40 mg total) by mouth daily. Patient taking differently: Take 40 mg by mouth daily before breakfast.  09/11/18  Yes Isaac Bliss, [name], MD  ondansetron (ZOFRAN ODT) 4 MG disintegrating tablet Take 1 tablet (4 mg total) by mouth every 8 (eight) hours as needed for nausea or vomiting. 09/11/18  Yes Isaac Bliss, [name], MD  Polyethyl Glycol-Propyl Glycol (SYSTANE) 0.4-0.3 % SOLN Place 1 drop into both eyes 3 (three) times daily.    Yes [provider]    I have reviewed the patient's current medications.  Labs:  BMP Latest Ref Rng & Units 10/04/2018 10/04/2018 09/25/2018  Glucose 70 - 99 mg/dL 117(H) 120(H) 102(H)  BUN 8 - 23 mg/dL 60(H) 60(H) 44(H)  Creatinine 0.61 - 1.24 mg/dL 4.79(H) 4.80(H) 2.99(H)  BUN/Creat Ratio 10 - 24 - - -  Sodium 135 - 145 mmol/L 135 139 139  Potassium 3.5 - 5.1 mmol/L 4.4 4.4 4.4  Chloride 98 - 111 mmol/L 103 106 105  CO2 22 - 32 mmol/L 20(L) 20(L) 22  Calcium 8.9 - 10.3 mg/dL 8.7(L) 8.8(L) 8.7    Urinalysis    Component Value Date/Time   COLORURINE YELLOW 09/14/2018 1003   APPEARANCEUR HAZY (A) 09/14/2018 1003   LABSPEC 1.004 (L) 09/14/2018 1003   PHURINE 6.0 09/14/2018 1003   GLUCOSEU NEGATIVE 09/14/2018 1003   HGBUR LARGE (A) 09/14/2018 1003   BILIRUBINUR NEGATIVE 09/14/2018 1003   BILIRUBINUR neg 06/30/2014 [place] 09/14/2018 1003   [place] 09/14/2018 1003  UROBILINOGEN 0.2 12/04/2014 2344   NITRITE NEGATIVE 09/14/2018 1003   LEUKOCYTESUR  LARGE (A) 09/14/2018 1003     ROS:  Pertinent items noted in HPI and remainder of comprehensive ROS otherwise negative.  Physical Exam: Vitals:   10/04/18 1525 10/04/18 1552  BP: (!) 149/80 (!) 147/74  Pulse: 70 71  Resp: 16 16  Temp:    SpO2: 98% 98%     General: Elderly male in stretcher in no acute distress  HEENT: NCAT; hard of hearing  Eyes: EOMI sclera anicteric Neck: trachea midline; supple Heart: S1S2; no rub  Lungs: clear anteriorly but reduced breath sounds; crackles posterior bases  Abdomen: distended; minimal tenderness on exam; suprapubic catheter is in place with urine in the bag  Extremities: 2+ edema bilateral lower extremities Skin: no rash on extremities exposed  Neuro: patient is oriented to person, place, and location   Assessment/Plan:  # AKI  - Recent recurrent AKI with hx obstructive uropathy.  His AKI had been resolving with SPC exchange.  Had also been hydrated with lasix suspended that admission and then later resumed on PRN basis and recently transitioned low dose scheduled lasix.  - May represent CKD progression in light of recent AKI events  - rule out obstruction with renal ultrasound; suprapubic catheter is in place and appears to be draining  - Discussed with primary team.  Agree with plans for palliative consult - The patient has indicated that he would not want dialysis     # Obstructive uropathy - Chronic suprapubic catheter is in place and draining   # Acute on chronic systolic CHF  - Lasix 60 mg IV once now and reassess response  # Abdominal pain - Renal ultrasound as above to r/o any contributing component   # CKD stage III  - baseline Cr had been 1.3 - 1.5 prior to 08/2018 AKI episodes - Appears to have had CKD progression with the AKI events  # Compression fractures - Numerous compression fractures incidentally noted on CXR  - Primary team is initiating pain control regimen - appreciate their assistance   # Anemia  -  Secondary in part to AKI/CKD  - Aranesp once today - Outpt iron checked at 57 and 22% sat - Feraheme x 1   Agree with palliative care consult.   Claudia Desanctis 10/04/2018, 4:41 PM

## 2018-10-04 NOTE — H&P (Signed)
History and Physical    Ronald [name] OVZ:[id number] DOB: 04-Nov-1927 DOA: 10/04/2018  PCP: Isaac Bliss, [name], MD Consultants:  Angelena Form - cardiology Patient coming from:  Home - lives with wife; NOK: Wife, [name] [place]   Chief Complaint: Edema  HPI: Ronald [name] is a 85 y.o. male with medical history significant of CAD status post CABG; ischemic cardiomyopathy with EF 40-45% on 6/3 echo; A. fib on Eliquis;chronic kidney disease stage 3 (baseline creatinine 3); and prostate cancer with suprapubic catheter presenting with edema.  "I wish I knew... You see, what's happening.  My wife and these doctors and nurses have made these appointments and they've come up with an idea that I've got a kidney problem.  They's supposed to draw some fluids off my legs and stuff because they getting too heavy.  Got that?  Well, they aren't drawn that off yet... They have done everything but since I've been since here."  +SOB.  No CP.     He was last admitted from 6/19-25 for AKI on stage 3 CKD with metabolic acidosis and suprapubic catheter-associated UTI with Stenotrophomonas.     ED Course:  Saw nephrology in f/u yesterday.  Creatinine increased.  His wife is concerned about suprapubic pain and abdominal pain and so he was sent to ER.  C/o abdominal pain, as well as chest pain with pleuritic component. Creatinine 4.8, prior 3.  Also some fluid overload on exam with 2+ edema, mild crackles, PND.  CXR clear.  Nephrology to see, Dr. Royce Macadamia.  Not given Lasix, on room air without respiratory concerns.  Review of Systems: As per HPI; otherwise review of systems reviewed and negative.   Ambulatory Status:  Ambulates with a cane  Past Medical History:  Diagnosis Date  . Anemia 09/14/2018  . Arthritis    "joints ache" (01/02/2014)  . At risk for sleep apnea    STOP-BANG= 4    SENT TO PCP 12-23-2013  . Bladder calculi   . Bradycardia   . Chronic cystitis   . Chronic systolic CHF (congestive  heart failure) (HCC)    a. EF improved to 50-55% in 2018, previously 35-40%  . CKD (chronic kidney disease), stage II   . Coronary artery disease CARDIOLOGIST-  DR Angelena Form   a. CAD s/p CABG in 1989. b. redo bypass in 1997. c. last cath 2015 - med rx.  . Diverticulosis   . GERD (gastroesophageal reflux disease)   . History of Bell's palsy    RIGHT SIDE-- NO RESIDUAL  . Hx of dizziness   . Hyperlipidemia   . Hypertension    "not anymore" (01/02/2014)  . Ischemic cardiomyopathy   . Kidney stones "years ago"   "passed them"  . Melanoma of ear ([place])    "right"  . Mild dementia ([place])   . Myocardial infarction ([place]) 1986; 1997  . Nocturia   . OAB (overactive bladder)   . Persistent atrial fibrillation   . Prostate cancer ([place]) 1998   S/P  [place]; [facility]  . S/P CABG (coronary artery bypass graft)    [place]  . Sepsis due to urinary tract infection ([place])   . Urethral stricture   . UTI (urinary tract infection) 09/2015  . Wears hearing aid    bilateral  . Wears partial dentures     Past Surgical History:  Procedure Laterality Date  . CARDIAC CATHETERIZATION  02-22-2005  dr Vidal Schwalbe  mild to moderate lv dysfunction with inferobasilar akinesis/  totally occluded SVG to Intermediate Diagonal and SVG to PDA and RCA branches, totally occluded native coronary circulation with diffuse disease pLAD diagonal system with potentially could be ischemic/  patent SVG to OM with collaterals to dRCA and patent LIMA to LAD and diagonal systemss  . CARDIAC CATHETERIZATION  08-28-2013  DR Daneen Schick   widely patent sequential left internal graft to the diagonal/LAD, widely patent SVG to OM with proximal 50% narrowing noted in the graft, total occlusion SVG's to RCA, RI, and the Diagonal/ LV dysfunction with inferobasal aneurysm and mid anterior wall region of akinesis/  overall EF 35-40%/  Total occlusion of the navtive circulation/  no significant change  compared to 2006 cath  . CARDIOVASCULAR STRESS TEST  08-01-2011  dr Angelena Form   inferior scar and possible soft tissue attenuation with minimal peri-infarct ischemia, small region of anterior ischemia and scar/  LVEF 53% LV wall motion with inferior hypokinesis/ no significant change from scan july 2011  . CATARACT EXTRACTION W/ INTRAOCULAR LENS  IMPLANT, BILATERAL Bilateral 2007  . CORONARY ARTERY BYPASS GRAFT  11/ [place]-- 6 vessel/  1997 Re-do 5 vessel  . CYSTOSCOPY WITH URETHRAL DILATATION N/A 12/25/2013   Procedure: CYSTOSCOPY WITH URETHRAL DILATATION, WITH BIOPSY;  Surgeon: Bernestine Amass, MD;  Location: [facility];  Service: Urology;  Laterality: N/A;  . CYSTOSCOPY WITH URETHRAL DILATATION N/A 12/22/2014   Procedure: CYSTOSCOPY WITH URETHRAL DILATATION;  Surgeon: Rana Snare, MD;  Location: [facility];  Service: Urology;  Laterality: N/A;  BALLOON DILATION CATHETER    . EUS  10/05/2011   Procedure: ESOPHAGEAL ENDOSCOPIC ULTRASOUND (EUS) RADIAL;  Surgeon: Arta Silence, MD;  Location: [facility];  Service: Endoscopy;  Laterality: N/A;  . INSERTION OF SUPRAPUBIC CATHETER N/A 12/22/2014   Procedure: INSERTION OF SUPRAPUBIC CATHETER ;  Surgeon: Rana Snare, MD;  Location: [facility];  Service: Urology;  Laterality: N/A;  . LAPAROSCOPIC CHOLECYSTECTOMY  12-23-2005  . LEFT HEART CATHETERIZATION WITH CORONARY/GRAFT ANGIOGRAM N/A 08/28/2013   Procedure: LEFT HEART CATHETERIZATION WITH Beatrix Fetters;  Surgeon: Sinclair Grooms, MD;  Location[facility];  Service: Cardiovascular;  Laterality: N/A;  . MELANOMA EXCISION  X 1   "ear"  . RADIOACTIVE PROSTATE SEED IMPLANTS  1998  . SKIN CANCER EXCISION  X 2   "top of head"  . TONSILLECTOMY AND ADENOIDECTOMY  1954    Social History   Socioeconomic History  . Marital status: Married    Spouse name: Not on file  . Number of children: Not on file  . Years of education: Not on file  . Highest education  level: Not on file  Occupational History  . Not on file  Social Needs  . Financial resource strain: Not on file  . Food insecurity    Worry: Not on file    Inability: Not on file  . Transportation needs    Medical: Not on file    Non-medical: Not on file  Tobacco Use  . Smoking status: Former Smoker    Years: 40.00    Types: Cigars    Quit date: 12/24/1983    Years since quitting: 34.8  . Smokeless tobacco: Former Systems developer    Types: Chew    Quit date: 08/28/2013  . Tobacco comment: smoked a pipe  Substance and Sexual Activity  . Alcohol use: No  . Drug use: No  . Sexual activity:  Not on file  Lifestyle  . Physical activity    Days per week: Not on file    Minutes per session: Not on file  . Stress: Not on file  Relationships  . Social Herbalist on phone: Not on file    Gets together: Not on file    Attends religious service: Not on file    Active member of club or organization: Not on file    Attends meetings of clubs or organizations: Not on file    Relationship status: Not on file  . Intimate partner violence    Fear of current or ex partner: Not on file    Emotionally abused: Not on file    Physically abused: Not on file    Forced sexual activity: Not on file  Other Topics Concern  . Not on file  Social History Narrative   Was a Clinical cytogeneticist   Was in the [employer] x 4 years   Father died and felt he needed to come home to help his mother    Was asked to rejoin      2 children; boy and girl   3 grand children   One grandson in [place], one grandson in [place];    [place] a job as Nurse, adult dtr; [place].     Allergies  Allergen Reactions  . Lidocaine Shortness Of Breath and Swelling    Mouth swelling   . Pantoprazole Other (See Comments)    Made the patient lightheaded and dizzy  . Nitrofurantoin Hives and Other (See Comments)    Also made the patient lightheaded and dizzy  . Sulfa Antibiotics Hives and Itching  . Tape Other  (See Comments)    TAPE WILL TEAR THE SKIN!!!    Family History  Problem Relation Age of Onset  . Heart disease Mother   . Diabetes Father   . Heart disease Brother     Prior to Admission medications   Medication Sig Start Date End Date Taking? Authorizing Provider  acetaminophen (TYLENOL) 500 MG tablet Take 500 mg by mouth every 6 (six) hours as needed for moderate pain or fever.     [provider]  apixaban (ELIQUIS) 5 MG TABS tablet Take 2.5 mg by mouth 2 (two) times daily.    [provider]  atorvastatin (LIPITOR) 80 MG tablet Take 0.5 tablets (40 mg total) by mouth every evening. 05/24/17   Marletta Lor, MD  Calcium Carbonate Antacid (TUMS PO) Take 3 tablets by mouth daily as needed (gas).    [provider]  furosemide (LASIX) 20 MG tablet Take 1 tablet (20 mg total) by mouth daily. 10/01/18 09/26/19  Burnell Blanks, MD  isosorbide mononitrate (IMDUR) 30 MG 24 hr tablet Take 0.5 tablets (15 mg total) by mouth daily. 09/13/18 12/12/18  Isaiah Serge, NP  meclizine (ANTIVERT) 12.5 MG tablet Take 1 tablet (12.5 mg total) by mouth 3 (three) times daily as needed for dizziness. 06/11/18   Isaac Bliss, [name], MD  Multiple Vitamin (MULTIVITAMIN WITH MINERALS) TABS Take 1 tablet by mouth daily.    [provider]  nitroGLYCERIN (NITROSTAT) 0.4 MG SL tablet Place 0.4 mg under the tongue every 5 (five) minutes as needed for chest pain.     [provider]  omeprazole (PRILOSEC) 40 MG capsule Take 1 capsule (40 mg total) by mouth daily. 09/11/18   Isaac Bliss, [name], MD  ondansetron [facility]  ODT) 4 MG disintegrating tablet Take 1 tablet (4 mg total) by mouth every 8 (eight) hours as needed for nausea or vomiting. 09/11/18   Isaac Bliss, [name], MD  Polyethyl Glycol-Propyl Glycol (SYSTANE) 0.4-0.3 % SOLN Apply 1 drop to eye 3 (three) times daily.    [provider]    Physical Exam: Vitals:   10/04/18 1430  10/04/18 1500 10/04/18 1525 10/04/18 1552  BP: (!) 146/82 (!) 149/74 (!) 149/80 (!) 147/74  Pulse: 66 70 70 71  Resp: 16 16 16 16   Temp:      TempSrc:      SpO2: 98% 98% 98% 98%  Weight:      Height:         . General:  Appears calm and comfortable and is NAD . Eyes:  PERRL, EOMI, normal lids, iris . ENT:  grossly normal hearing, lips & tongue, mmm . Neck:  no LAD, masses or thyromegaly . Cardiovascular:  RRR, no m/r/g. 2-3+ LE edema.  Marland Kitchen Respiratory:   CTA bilaterally with no wheezes/rales/rhonchi.  Normal respiratory effort. . Abdomen:  soft, NT, ND, NABS, suprapubic catheter in place without surrounding erythema or apparent drainage . Skin:  no rash or induration seen on limited exam . Musculoskeletal:  grossly normal tone BUE/BLE, good ROM, no bony abnormality . Psychiatric:  grossly normal mood and affect, speech fluent and appropriate, AOx3 . Neurologic:  CN 2-12 grossly intact, moves all extremities in coordinated fashion    Radiological Exams on Admission: Dg Chest 2 View  Result Date: 10/04/2018 CLINICAL DATA:  SOB,SWELLING IN LEGS,FLUID OVERLOAD,HX CHF,HTN,MI EXAM: CHEST - 2 VIEW COMPARISON:  Prior studies including 09/14/2018 FINDINGS: Median sternotomy and CABG. The heart is enlarged. There is shallow lung inflation. Minimal atelectasis is identified at both lung bases. There are no focal consolidations, pleural effusions, or pulmonary edema. Numerous thoracic compression fractures. IMPRESSION: 1. Cardiomegaly. 2. Bibasilar atelectasis. 3. Lungs otherwise clear. Electronically Signed   By: Nolon Nations M.D.   On: 10/04/2018 12:59    EKG: Independently reviewed.  Aflutter with rate 71; nonspecific ST changes with no evidence of acute ischemia   Labs on Admission: I have personally reviewed the available labs and imaging studies at the time of the admission.  Pertinent labs:   CO2 20 Glucose 117 BUN 60/Creatinine 4.79/GFR 10; baseline about 3 BNP 545.1 WBC 6.2  Hgb 8.1 Platelets 115 COVID negative; also negative on 6/19  Assessment/Plan Principal Problem:   Cardiorenal syndrome Active Problems:   HTN (hypertension)   History of prostate cancer   Chronic combined systolic (congestive) and diastolic (congestive) heart failure (HCC)   PAF (paroxysmal atrial fibrillation) (HCC)   Acute on chronic renal failure (HCC)   Anemia due to chronic kidney disease   Compression fracture of thoracic spine, non-traumatic, initial encounter (HCC)   Cardiorenal syndrome -He was due to see nephrology tomorrow; they have been discussing palliative care options and he was scheduled to see Dr. Royce Macadamia  tomorrow for f/u.   -His wife called the office today with uncontrolled pain and he was directed to the ER.   -He is in NAD in the ER but reports chronic discomfort. -Patient with progressive edema -With his h/o both CKD and CHF, this is likely cardiorenal syndrome -He is not a good dialysis candidate and so if he is unable to diurese with Lasix without causing significant worsening renal dysfunction, this may be a terminal condition  Acute renal failure on stage 4-5 CKD with anemia -  Baseline creatinine appears to be 3 but in the last week has increased to 4.8 -He is also volume overloaded -Nephrology consulted -One time dose of Lasix and recheck kidneys tomorrow -Renal US ordered  Chronic combined CHF -6/3 echo with ER 40-45% - but this may be an overestimate and so definity contrast study was recommended -As noted, also with progressive renal dysfunction -Overall, his prognosis appears to be poor  -Will admit -Lasix x 1, as above -Palliative care consultation, likely for end of life care  Afib -Continue Eliquis -Rate controlled without medication  Compression fractures of the thoracic spine -Patient reports chronic pain at home and in the ER -CXR shows multiple thoracic compression fractures -This is very likely contributing to his discomfort  -Consider lidocaine patches for his T-spine - but he has a reported allergy of SOB and mouth swelling with lidocaine -Consider calcitonin nasal spray, since this often helps pain associated with compression fractures - but I am unsure how this will impact his renal situation so will defer for now -Oxy IR for breakthrough pain -IR could also be considered for vertebroplasty  HTN -Continue Imdur but he is not on other BP medications at this time  H/o prostate CA, with suprapubic catheter -There is some question of whether the catheter may be obstructed and contributing to symptoms -Nephrology to order renal US    Note: This patient has been tested and is negative for the novel coronavirus COVID-19.  DVT prophylaxis: Eliquis Code Status:  DNR - confirmed with patient Family Communication: Dr. Royce Macadamia spoke with the patient's wife by telephone Disposition Plan:  Home once clinically improved Consults called: Nephrology  Admission status: Admit - It is my clinical opinion that admission to INPATIENT is reasonable and necessary because of the expectation that this patient will require hospital care that crosses at least 2 midnights to treat this condition based on the medical complexity of the problems presented.  Given the aforementioned information, the predictability of an adverse outcome is felt to be significant.    Karmen Bongo MD [facility]   How to contact the [facility] Attending or Consulting provider [place] or covering provider during after hours [place], for this patient?  1. Check the care team in [facility] and look for a) attending/consulting [facility] provider listed and b) the [facility] team listed 2. Log into [email or web address] and use [place]'s universal password to access. If you do not have the password, please contact the hospital operator. 3. Locate the [facility] provider you are looking for under [facility] and page to a number that you can be directly reached. 4. If you still  have difficulty reaching the provider, please page the [facility] (Director on Call) for the Hospitalists listed on amion for assistance.   10/04/2018, 5:58 PM

## 2018-10-04 NOTE — ED Provider Notes (Signed)
[facility] EMERGENCY DEPARTMENT Provider Note   CSN: [id number] Arrival date & time: 10/04/18  1112     History   Chief Complaint Chief Complaint  Patient presents with  . abnormal labs    HPI Ronald [name] is a 85 y.o. male.     Patient states he was told to come to the hospital by his nephrologist because his 'labs were worse' and also stating it had something to do with his fluid pills not working as well.  He has a suprapubic catheter for which he states is due to 'his kidneys clogging up'.  He states that he has not felt any different lately other than a sore throat.  He has some shortness of breath at baseline with a chronic productive cough.  He says he can walk around a grocery store without using a motorized chair as long as he stops to take his breath.  He does not think it has been any worse lately.  He usually sleeps somewhat in a reclined position.    Per nephrologist, renal function had worsened and he was scheduled for an appt tomorrow.  Has never seen nephrology.  Initially he was going to be palliative care.  Cr was 4.5 yesterday.  Wife called and said he was complaining of pain.  Recommended coming back tomorrow.  Wife said pain was uncontrolled and so she directed them to go to the ER.  Goals of care are in flux.  Limited insight.  Was considering hospice, wasn't sure if he'd followed up.  Reason for sending him to the ER was his pain.  aki has improved with exchange of catheter.       Past Medical History:  Diagnosis Date  . Anemia 09/14/2018  . Arthritis    "joints ache" (01/02/2014)  . At risk for sleep apnea    STOP-BANG= 4    SENT TO PCP 12-23-2013  . Bladder calculi   . Bradycardia   . Chronic cystitis   . Chronic systolic CHF (congestive heart failure) (HCC)    a. EF improved to 50-55% in 2018, previously 35-40%  . CKD (chronic kidney disease), stage II   . Coronary artery disease CARDIOLOGIST-  DR Angelena Form   a. CAD s/p CABG in  1989. b. redo bypass in 1997. c. last cath 2015 - med rx.  . Diverticulosis   . GERD (gastroesophageal reflux disease)   . History of Bell's palsy    RIGHT SIDE-- NO RESIDUAL  . Hx of dizziness   . Hyperlipidemia   . Hypertension    "not anymore" (01/02/2014)  . Ischemic cardiomyopathy   . Kidney stones "years ago"   "passed them"  . Melanoma of ear ([place])    "right"  . Mild dementia ([place])   . Myocardial infarction ([place]) 1986; 1997  . Nocturia   . OAB (overactive bladder)   . Persistent atrial fibrillation   . Prostate cancer ([place]) 1998   S/P  [place]; [place]  . S/P CABG (coronary artery bypass graft)    [place]  . Sepsis due to urinary tract infection ([place])   . Urethral stricture   . UTI (urinary tract infection) 09/2015  . Wears hearing aid    bilateral  . Wears partial dentures     Patient Active Problem List   Diagnosis Date Noted  . Anemia due to chronic kidney disease 09/17/2018  . Acute renal failure (ARF) (  [place]) 09/14/2018  . Elevated troponin 08/28/2018  . Normocytic anemia 08/28/2018  . UTI (urinary tract infection) 03/06/2018  . Chronic diastolic CHF (congestive heart failure) ([place]) 12/26/2017  . PAF (paroxysmal atrial fibrillation) ([place]) 12/26/2017  . Fever 05/11/2017  . AKI (acute kidney injury) ([place])   . CKD (chronic kidney disease), stage III ([place])   . Bacteremia due to Pseudomonas 01/21/2016  . Debility 01/21/2016  . History of Bell's palsy   . Gastroesophageal reflux disease without esophagitis   . Coronary artery disease involving coronary bypass graft of native heart without angina pectoris   . History of prostate cancer   . Suprapubic catheter ([place])   . Incontinence of feces   . Renal failure syndrome   . Bacteremia   . NSTEMI (non-ST elevated myocardial infarction) ([place]) 01/16/2016  . ARF (acute renal failure) ([place]) 10/13/2015  . Influenza A 12/05/2014  . Urinary tract infection associated with  cystostomy catheter ([place]) 10/03/2014  . Acute lower UTI 09/10/2014  . Bacteremia due to Enterococcus 01/22/2014  . Chronic systolic CHF (congestive heart failure) ([place]) September 09, 202015  . Urethral stricture 12/25/2013  . Bladder calculus 12/25/2013  . LV dysfunction 09/23/2013  . Unstable angina ([place]) 08/27/2013  . Bacteremia due to Escherichia coli 05/20/2012  . Urinary tract infection 09/15/2011  . Calculus of kidney 09/12/2011  . HYPERCHOLESTEROLEMIA 10/18/2007  . HTN (hypertension) 10/18/2007  . OSTEOARTHRITIS 02/07/2007  . BELL'S PALSY, RIGHT 10/26/2006  . Coronary atherosclerosis 10/26/2006  . PROSTATE CANCER, HX OF 10/26/2006  . NEPHROLITHIASIS, HX OF 10/26/2006    Past Surgical History:  Procedure Laterality Date  . CARDIAC CATHETERIZATION  02-22-2005  dr Vidal Schwalbe   mild to moderate lv dysfunction with inferobasilar akinesis/  totally occluded SVG to Intermediate Diagonal and SVG to PDA and RCA branches, totally occluded native coronary circulation with diffuse disease pLAD diagonal system with potentially could be ischemic/  patent SVG to OM with collaterals to dRCA and patent LIMA to LAD and diagonal systemss  . CARDIAC CATHETERIZATION  08-28-2013  DR Daneen Schick   widely patent sequential left internal graft to the diagonal/LAD, widely patent SVG to OM with proximal 50% narrowing noted in the graft, total occlusion SVG's to RCA, RI, and the Diagonal/ LV dysfunction with inferobasal aneurysm and mid anterior wall region of akinesis/  overall EF 35-40%/  Total occlusion of the navtive circulation/  no significant change compared to 2006 cath  . CARDIOVASCULAR STRESS TEST  08-01-2011  dr Angelena Form   inferior scar and possible soft tissue attenuation with minimal peri-infarct ischemia, small region of anterior ischemia and scar/  LVEF 53% LV wall motion with inferior hypokinesis/ no significant change from scan july 2011  . CATARACT EXTRACTION W/ INTRAOCULAR LENS  IMPLANT, BILATERAL  Bilateral 2007  . CORONARY ARTERY BYPASS GRAFT  11/ [place]-- 6 vessel/  1997 Re-do 5 vessel  . CYSTOSCOPY WITH URETHRAL DILATATION N/A 12/25/2013   Procedure: CYSTOSCOPY WITH URETHRAL DILATATION, WITH BIOPSY;  Surgeon: Bernestine Amass, MD;  Location: [facility];  Service: Urology;  Laterality: N/A;  . CYSTOSCOPY WITH URETHRAL DILATATION N/A 12/22/2014   Procedure: CYSTOSCOPY WITH URETHRAL DILATATION;  Surgeon: Rana Snare, MD;  Location: [facility];  Service: Urology;  Laterality: N/A;  BALLOON DILATION CATHETER    . EUS  10/05/2011   Procedure: ESOPHAGEAL ENDOSCOPIC ULTRASOUND (EUS) RADIAL;  Surgeon: Arta Silence, MD;  Location: [facility];  Service: Endoscopy;  Laterality: N/A;  . INSERTION  OF SUPRAPUBIC CATHETER N/A 12/22/2014   Procedure: INSERTION OF SUPRAPUBIC CATHETER ;  Surgeon: Rana Snare, MD;  Location: [facility];  Service: Urology;  Laterality: N/A;  . LAPAROSCOPIC CHOLECYSTECTOMY  12-23-2005  . LEFT HEART CATHETERIZATION WITH CORONARY/GRAFT ANGIOGRAM N/A 08/28/2013   Procedure: LEFT HEART CATHETERIZATION WITH Beatrix Fetters;  Surgeon: Sinclair Grooms, MD;  Location: [facility] C[facility] LAB;  Service: Cardiovascular;  Laterality: N/A;  . MELANOMA EXCISION  X 1   "ear"  . RADIOACTIVE PROSTATE SEED IMPLANTS  1998  . SKIN CANCER EXCISION  X 2   "top of head"  . TONSILLECTOMY AND ADENOIDECTOMY  1954        Home Medications    Prior to Admission medications   Medication Sig Start Date End Date Taking? Authorizing Provider  acetaminophen (TYLENOL) 500 MG tablet Take 500 mg by mouth every 6 (six) hours as needed for moderate pain or fever.     [provider]  apixaban (ELIQUIS) 5 MG TABS tablet Take 2.5 mg by mouth 2 (two) times daily.    [provider]  atorvastatin (LIPITOR) 80 MG tablet Take 0.5 tablets (40 mg total) by mouth every evening. 05/24/17   Marletta Lor, MD  Calcium Carbonate Antacid (TUMS PO) Take 3 tablets by  mouth daily as needed (gas).    [provider]  furosemide (LASIX) 20 MG tablet Take 1 tablet (20 mg total) by mouth daily. 10/01/18 09/26/19  Burnell Blanks, MD  isosorbide mononitrate (IMDUR) 30 MG 24 hr tablet Take 0.5 tablets (15 mg total) by mouth daily. 09/13/18 12/12/18  Isaiah Serge, NP  meclizine (ANTIVERT) 12.5 MG tablet Take 1 tablet (12.5 mg total) by mouth 3 (three) times daily as needed for dizziness. 06/11/18   Isaac Bliss, [name], MD  Multiple Vitamin (MULTIVITAMIN WITH MINERALS) TABS Take 1 tablet by mouth daily.    [provider]  nitroGLYCERIN (NITROSTAT) 0.4 MG SL tablet Place 0.4 mg under the tongue every 5 (five) minutes as needed for chest pain.     [provider]  omeprazole (PRILOSEC) 40 MG capsule Take 1 capsule (40 mg total) by mouth daily. 09/11/18   Isaac Bliss, [name], MD  ondansetron (ZOFRAN ODT) 4 MG disintegrating tablet Take 1 tablet (4 mg total) by mouth every 8 (eight) hours as needed for nausea or vomiting. 09/11/18   Isaac Bliss, [name], MD  Polyethyl Glycol-Propyl Glycol (SYSTANE) 0.4-0.3 % SOLN Apply 1 drop to eye 3 (three) times daily.    [provider]    Family History Family History  Problem Relation Age of Onset  . Heart disease Mother   . Diabetes Father   . Heart disease Brother     Social History Social History   Tobacco Use  . Smoking status: Former Smoker    Years: 40.00    Types: Cigars    Quit date: 12/24/1983    Years since quitting: 34.8  . Smokeless tobacco: Former Systems developer    Types: Chew    Quit date: 08/28/2013  . Tobacco comment: smoked a pipe  Substance Use Topics  . Alcohol use: No  . Drug use: No     Allergies   Pantoprazole, Nitrofurantoin, Sulfa antibiotics, Lidocaine, and Tape   Review of Systems Review of Systems  Constitutional: Negative for fatigue and fever.  HENT: Positive for sore throat. Negative for rhinorrhea.   Respiratory: Positive for  cough. Negative for chest tightness, shortness of breath, wheezing and stridor.  Cardiovascular: Positive for leg swelling. Negative for chest pain.  Gastrointestinal: Negative for abdominal pain, diarrhea, nausea and vomiting.  Genitourinary: Negative for dysuria.  Neurological: Negative for syncope, speech difficulty, weakness and headaches.     Physical Exam Updated Vital Signs Pulse 71   Temp 98.3 F (36.8 C) (Oral)   Resp 18   Ht 5\' 5"  (1.651 m)   Wt 69.9 kg   SpO2 100%   BMI 25.63 kg/m   Physical Exam Constitutional:      General: He is not in acute distress.    Appearance: Normal appearance. He is not ill-appearing.  HENT:     Head: Normocephalic and atraumatic.     Nose: No congestion or rhinorrhea.     Mouth/Throat:     Mouth: Mucous membranes are moist.     Pharynx: Oropharynx is clear. No oropharyngeal exudate or posterior oropharyngeal erythema.  Eyes:     Extraocular Movements: Extraocular movements intact.     Conjunctiva/sclera: Conjunctivae normal.     Pupils: Pupils are equal, round, and reactive to light.  Cardiovascular:     Rate and Rhythm: Normal rate and regular rhythm.     Heart sounds: Murmur present.     Comments: Systolic murmur Pulmonary:     Effort: Pulmonary effort is normal.     Breath sounds: Normal breath sounds.     Comments: Very mild bibasilar crackles.  Abdominal:     Palpations: Abdomen is soft. There is no mass.     Tenderness: There is no abdominal tenderness.     Comments: Suprapubic cath.  Site does not appear infected.   Musculoskeletal:        General: Swelling present. No tenderness.     Comments: 2+ pitting edema bilaterally  Skin:    General: Skin is warm and dry.  Neurological:     General: No focal deficit present.     Mental Status: He is alert and oriented to person, place, and time.     Cranial Nerves: No cranial nerve deficit.     Motor: No weakness.  Psychiatric:        Mood and Affect: Mood normal.         Behavior: Behavior normal.      ED Treatments / Results  Labs (all labs ordered are listed, but only abnormal results are displayed) Labs Reviewed  BASIC METABOLIC PANEL - Abnormal; Notable for the following components:      Result Value   CO2 20 (*)    Glucose, Bld 120 (*)    BUN 60 (*)    Creatinine, Ser 4.80 (*)    Calcium 8.8 (*)    GFR calc non Af Amer 10 (*)    GFR calc Af Amer 12 (*)    All other components within normal limits  CBC - Abnormal; Notable for the following components:   RBC 2.57 (*)    Hemoglobin 8.1 (*)    HCT 25.3 (*)    All other components within normal limits  COMPREHENSIVE METABOLIC PANEL  BRAIN NATRIURETIC PEPTIDE    EKG None  Radiology No results found.  Procedures Procedures (including critical care time)  Medications Ordered in ED Medications  sodium chloride flush (NS) 0.9 % injection 3 mL (has no administration in time range)     Initial Impression / Assessment and Plan / ED Course  I have reviewed the triage vital signs and the nursing notes.  Pertinent labs & imaging results that were available during  my care of the patient were reviewed by me and considered in my medical decision making (see chart for details).  Clinical Course as of Oct 04 1414  Thu Oct 04, 2018  1414 B Natriuretic Peptide(!): 545.1 [DO]    Clinical Course User Index [DO] Benay Pike, MD       Patient is a 85 year old man with a history of CKD and bladder outlet obstruction with a suprapubic catheter.  Patient was recently seen by nephrology for worsening kidney function during his last inpatient stay, but has not actually followed up with him yet in the office.  There was some question of whether he was to choose hospice or continue work-up but patient apparently chose to opt for further work-up and management.  Patient had previsit labs drawn yesterday which showed worsening of his creatinine from 2.8 during his discharge to 4.8 yesterday.  The  nephrologist, Dr. Royce Macadamia, moved up his appointment to tomorrow, but in the meantime the patient's wife called multiple times saying the patient was in extreme amount of pain which prompted the nephrologist to tell her to come to the emergency department for further management.  While here the patient does not complain about any abdominal or urinary pain. On exam he was mildly tender in the suprapubic region.  Suprapubic catheter did not appear infected at the site of insertion, urine did not appear to be infected.  Urine was amber in appearance.  CMP showed creatinine at 4.79 and BUN of 60.  Chest x-ray did not show volume overload, only some atelectasis.  Hemoglobin was 8.1, which is mildly lower than previous results.  BNP is 541 which is slightly higher than his previous value a month ago at 441.  The nephrologist, Dr. Royce Macadamia, who advised patient to come in is in the ED and has agreed to see the patient. The patient has been admitted to the hospitalist service for treatment of his worsening kidney function.    Final Clinical Impressions(s) / ED Diagnoses   Final diagnoses:  None    ED Discharge Orders    None       Benay Pike, MD 10/04/18 1620    Little, Wenda Overland, MD 10/04/18 [phone]

## 2018-10-04 NOTE — ED Triage Notes (Signed)
To ED for eval after being called and informed that his blood work 'is worse than before' and he needed to come to the hospital to get fluid off. Pt states his legs are swollen. 2+ edema noted. Pt is alert and oriented. States he lives with his wife, [name]. Speaks in full complete sentences.

## 2018-10-04 NOTE — ED Notes (Signed)
Bladder scanner showed bladder volume 52mL.

## 2018-10-04 NOTE — ED Notes (Signed)
Pt given snack, okay per resident MD, not given any fluids.

## 2018-10-05 ENCOUNTER — Telehealth: Payer: Self-pay | Admitting: Internal Medicine

## 2018-10-05 ENCOUNTER — Other Ambulatory Visit: Payer: Self-pay | Admitting: *Deleted

## 2018-10-05 DIAGNOSIS — M546 Pain in thoracic spine: Secondary | ICD-10-CM

## 2018-10-05 DIAGNOSIS — N179 Acute kidney failure, unspecified: Principal | ICD-10-CM

## 2018-10-05 DIAGNOSIS — R0602 Shortness of breath: Secondary | ICD-10-CM

## 2018-10-05 DIAGNOSIS — I5042 Chronic combined systolic (congestive) and diastolic (congestive) heart failure: Secondary | ICD-10-CM

## 2018-10-05 DIAGNOSIS — G8929 Other chronic pain: Secondary | ICD-10-CM

## 2018-10-05 DIAGNOSIS — Z515 Encounter for palliative care: Secondary | ICD-10-CM

## 2018-10-05 DIAGNOSIS — Z7189 Other specified counseling: Secondary | ICD-10-CM

## 2018-10-05 LAB — CBC
HCT: 23.9 % — ABNORMAL LOW (ref 39.0–52.0)
Hemoglobin: 7.9 g/dL — ABNORMAL LOW (ref 13.0–17.0)
MCH: 31.2 pg (ref 26.0–34.0)
MCHC: 33.1 g/dL (ref 30.0–36.0)
MCV: 94.5 fL (ref 80.0–100.0)
Platelets: 113 10*3/uL — ABNORMAL LOW (ref 150–400)
RBC: 2.53 MIL/uL — ABNORMAL LOW (ref 4.22–5.81)
RDW: 14.4 % (ref 11.5–15.5)
WBC: 6.2 10*3/uL (ref 4.0–10.5)
nRBC: 0 % (ref 0.0–0.2)

## 2018-10-05 LAB — BASIC METABOLIC PANEL
Anion gap: 12 (ref 5–15)
BUN: 64 mg/dL — ABNORMAL HIGH (ref 8–23)
CO2: 22 mmol/L (ref 22–32)
Calcium: 8.9 mg/dL (ref 8.9–10.3)
Chloride: 107 mmol/L (ref 98–111)
Creatinine, Ser: 5.04 mg/dL — ABNORMAL HIGH (ref 0.61–1.24)
GFR calc Af Amer: 11 mL/min — ABNORMAL LOW (ref >60)
GFR calc non Af Amer: 9 mL/min — ABNORMAL LOW (ref >60)
Glucose, Bld: 124 mg/dL — ABNORMAL HIGH (ref 70–99)
Potassium: 4 mmol/L (ref 3.5–5.1)
Sodium: 141 mmol/L (ref 135–145)

## 2018-10-05 NOTE — Progress Notes (Signed)
Patient ID: Ronald [name], male   DOB: 05-11-1927, 85 y.o.   MRN: [id number]   Request for VP per Dr Domenica Reamer.  Pt admitted for SOB and edema Notes  reflect also noted "pain in back- horrible pain" But yet today pt has no complaint of pain per MD  Numerous Thoracic fractures seen on CXR yesterday FINDINGS: Median sternotomy and CABG. The heart is enlarged. There is shallow lung inflation. Minimal atelectasis is identified at both lung bases. There are no focal consolidations, pleural effusions, or pulmonary edema. Numerous thoracic compression fractures.    Called Dr Benny Lennert- she was seeking advice as to next step. Pt is being evaluated by PT/OT today  Suggested- if after PT/OT eval and pt has back pain Would need Non Cx MRI T spine to evaluate for acute fracture  If has acute fracture(s) IR could consult pt for candidacy for VP/KP  She is agreeable to plan   IR will watch for order

## 2018-10-05 NOTE — Consult Note (Signed)
Consultation Note Date: 10/05/2018   Patient Name: Ronald [name]  DOB: [birth year]  MRN: [id number]  Age / Sex: 85 y.o., male  PCP: Ronald [name] Referring Physician: Karie Kirks, [name]  Reason for Consultation: Establishing goals of care, Hospice Evaluation and Psychosocial/spiritual support  HPI/Patient Profile: 85 y.o. male  with past medical history of ischemic cardiomyopathy with an ejection fraction of 40%, coronary artery disease status post CABG, atrial fib, chronic kidney disease stage III, prostate cancer with suprapubic catheter, recurrent urinary tract infections, chronic back pain admitted on 10/04/2018 with worsening shortness of breath, edema as well as pain.  Upon presentation to the emergency room, patient's creatinine was elevated from his baseline at 4.8 (baseline creatinine 3.0).  Chest x-ray revealed multiple compression fractures.  Renal ultrasound was performed and showed bilateral hydronephrosis  Consult ordered for goals of care and end-of-life care assistance.   Clinical Assessment and Goals of Care: Patient seen, chart reviewed.  Patient is alert and oriented x3.  He is up sitting in a recliner.  He is currently denying any back pain or shortness of breath.  Patient shares that at the age of 38 he fell off a roof onto carpenters horse and has had "bad back" ever since.  He states this is something he has lived with for a long for  long time.  Patient has had a very active life.  He was a Heritage manager for 35 years as well as serving 4 years in the [employer].  He is married.  They have been together 63 years.  They have 2 sons, and 1 daughter.  Patient shares that he is finding it very difficult as his health is failing to live his life between doctors appointments, visits to the emergency room, admission to the hospital.  He is expressing desire to treat the  treatable but not [name] anything aggressive.  He is a DNR and would not pursue hemodialysis where he to develop acute renal failure  And at this point is able to speak for himself.  Where he unable to, his wife, Ronald [name], would be his healthcare proxy.  She can be reached at [phone].  Secondary to COVID-19, Ronald [name] is not able to visit and I did update her via phone and discuss goals of care with her as well as Ronald [name].  I shared with Ronald [name] that Ronald [name] told  me he wants to spend as much time at home with his family and to be outdoors.  He would like to avoid recurrent hospitalizations if possible but at the same time would try to treat infections if that were beneficial particularly if he could avoid hospitalization.  We did discuss options of home with home health versus home with hospice.    SUMMARY OF RECOMMENDATIONS   DNR DNI No hemodialysis Continue to treat the treatable Medically maximized, home with hospice Care management notified as well as care management consult placed  Code Status/Advance Care Planning:  DNR  Symptom Management:   Back pain/ dyspnea: Patient has lived with back pain for a long time.  Per chart review, patient is being considered for VP/KP which may provide more lasting relief.  Topical agents such as Lidoderm patches as well as opioids for rescue would help not only his back pain but also shortness of breath secondary to his worsening heart failure.  Given his renal function, I would recommend low-dose oxycodone 2.5 mg to 5 mg every 4 hours as needed for shortness of breath and/or back pain  Palliative Prophylaxis:   Aspiration, Bowel Regimen, Delirium Protocol, Eye Care, Frequent Pain Assessment, Oral Care and Turn Reposition  Additional Recommendations (Limitations, Scope, Preferences):  Avoid Hospitalization, Minimize Medications, No Artificial Feeding, No Chemotherapy, No Hemodialysis, No Radiation and No Tracheostomy   Psycho-social/Spiritual:   Desire for further Chaplaincy support:no  Additional Recommendations: Referral to Community Resources   Prognosis:   < 6 months in the setting of worsening combined congestive heart failure, ischemic cardiomyopathy with EF of 40%, atrial fib, worsening chronic kidney disease with creatinine now 5.04.  Patient had prostate cancer with a history of high-dose radiation, now with bilateral hydronephrosis, likely secondary to late effects of radiation; multiple urinary tract infections requiring hospitalization.  High risk for urosepsis  Discharge Planning: Home with Hospice      Primary Diagnoses: Present on Admission: . Cardiorenal syndrome . Anemia due to chronic kidney disease . Chronic combined systolic (congestive) and diastolic (congestive) heart failure ([place]) . Acute on chronic renal failure ([place]) . HTN (hypertension) . PAF (paroxysmal atrial fibrillation) ([place]) . Compression fracture of thoracic spine, non-traumatic, initial encounter ([place])   I have reviewed the medical record, interviewed the patient and family, and examined the patient. The following aspects are pertinent.  Past Medical History:  Diagnosis Date  . Anemia 09/14/2018  . Arthritis    "joints ache" (01/02/2014)  . At risk for sleep apnea    STOP-BANG= 4    SENT TO PCP 12-23-2013  . Bladder calculi   . Bradycardia   . Chronic cystitis   . Chronic systolic CHF (congestive heart failure) (HCC)    a. EF improved to 50-55% in 2018, previously 35-40%  . CKD (chronic kidney disease), stage II   . Coronary artery disease CARDIOLOGIST-  Ronald [name]   a. CAD s/p CABG in 1989. b. redo bypass in 1997. c. last cath 2015 - med rx.  . Diverticulosis   . GERD (gastroesophageal reflux disease)   . History of Bell's palsy    RIGHT SIDE-- NO RESIDUAL  . Hx of dizziness   . Hyperlipidemia   . Hypertension    "not anymore" (01/02/2014)  . Ischemic cardiomyopathy   . Kidney stones "years ago"    "passed them"  . Melanoma of ear ([place])    "right"  . Mild dementia ([place])   . Myocardial infarction ([place]) 1986; 1997  . Nocturia   . OAB (overactive bladder)   . Persistent atrial fibrillation   . Prostate cancer (Ronald [name]) 1998   S/P  [place]; [place]  . S/P CABG (coronary artery bypass graft)    [place]  . Sepsis due to urinary tract infection ([place])   . Urethral stricture   . UTI (urinary tract infection) 09/2015  . Wears hearing aid    bilateral  . Wears partial dentures    Social History   Socioeconomic History  . Marital status: Married  Spouse name: Not on file  . Number of children: Not on file  . Years of education: Not on file  . Highest education level: Not on file  Occupational History  . Not on file  Social Needs  . Financial resource strain: Not on file  . Food insecurity    Worry: Not on file    Inability: Not on file  . Transportation needs    Medical: Not on file    Non-medical: Not on file  Tobacco Use  . Smoking status: Former Smoker    Years: 40.00    Types: Cigars    Quit date: 12/24/1983    Years since quitting: 34.8  . Smokeless tobacco: Former Systems developer    Types: Chew    Quit date: 08/28/2013  . Tobacco comment: smoked a pipe  Substance and Sexual Activity  . Alcohol use: No  . Drug use: No  . Sexual activity: Not on file  Lifestyle  . Physical activity    Days per week: Not on file    Minutes per session: Not on file  . Stress: Not on file  Relationships  . Social Herbalist on phone: Not on file    Gets together: Not on file    Attends religious service: Not on file    Active member of club or organization: Not on file    Attends meetings of clubs or organizations: Not on file    Relationship status: Not on file  Other Topics Concern  . Not on file  Social History Narrative   Was a Clinical cytogeneticist   Was in the [employer] x 4 years   Father died and felt he needed to come home to  help his mother    Was asked to rejoin      2 children; boy and girl   3 grand children   One grandson in [place], one grandson in [place];    Linda a job as Nurse, adult dtr; [place].    Family History  Problem Relation Age of Onset  . Heart disease Mother   . Diabetes Father   . Heart disease Brother    Scheduled Meds: . apixaban  2.5 mg Oral BID  . atorvastatin  40 mg Oral QPM  . isosorbide mononitrate  15 mg Oral Daily  . pantoprazole  40 mg Oral Daily  . sodium chloride flush  3 mL Intravenous Once   Continuous Infusions: PRN Meds:.acetaminophen **OR** acetaminophen, calcium carbonate (dosed in mg elemental calcium), camphor-menthol **AND** hydrOXYzine, docusate sodium, feeding supplement (NEPRO CARB STEADY), ondansetron **OR** ondansetron (ZOFRAN) IV, oxyCODONE, sorbitol, zolpidem Medications Prior to Admission:  Prior to Admission medications   Medication Sig Start Date End Date Taking? Authorizing Provider  acetaminophen (TYLENOL) 500 MG tablet Take 500 mg by mouth every 6 (six) hours as needed for mild pain, fever or headache.    Yes Provider, Historical, [name]  apixaban (ELIQUIS) 5 MG TABS tablet Take 2.5 mg by mouth 2 (two) times daily.   Yes Provider, Historical, [name]  atorvastatin (LIPITOR) 80 MG tablet Take 0.5 tablets (40 mg total) by mouth every evening. 05/24/17  Yes Marletta Lor, [name]  Calcium Carbonate Antacid (TUMS PO) Take 1-3 tablets by mouth daily as needed (for gas or indigestion).    Yes Provider, Historical, [name]  furosemide (LASIX) 20 MG tablet Take 1 tablet (20 mg total) by mouth daily. 10/01/18 09/26/19 Yes Burnell Blanks, [name]  isosorbide  mononitrate (IMDUR) 30 MG 24 hr tablet Take 0.5 tablets (15 mg total) by mouth daily. 09/13/18 12/12/18 Yes Isaiah Serge, NP  meclizine (ANTIVERT) 12.5 MG tablet Take 1 tablet (12.5 mg total) by mouth 3 (three) times daily as needed for dizziness. 06/11/18  Yes Ronald [name]  Multiple  Vitamin (MULTIVITAMIN WITH MINERALS) TABS Take 1 tablet by mouth daily with breakfast.    Yes Provider, Historical, [name]  nitroGLYCERIN (NITROSTAT) 0.4 MG SL tablet Place 0.4 mg under the tongue every 5 (five) minutes as needed for chest pain.    Yes Provider, Historical, [name]  omeprazole (PRILOSEC) 40 MG capsule Take 1 capsule (40 mg total) by mouth daily. Patient taking differently: Take 40 mg by mouth daily before breakfast.  09/11/18  Yes Ronald [name]  ondansetron (ZOFRAN ODT) 4 MG disintegrating tablet Take 1 tablet (4 mg total) by mouth every 8 (eight) hours as needed for nausea or vomiting. 09/11/18  Yes Ronald [name]  Polyethyl Glycol-Propyl Glycol (SYSTANE) 0.4-0.3 % SOLN Place 1 drop into both eyes 3 (three) times daily.    Yes Provider, Historical, [name]   Allergies  Allergen Reactions  . Lidocaine Shortness Of Breath and Swelling    Mouth swelling   . Pantoprazole Other (See Comments)    Made the patient lightheaded and dizzy  . Nitrofurantoin Hives and Other (See Comments)    Also made the patient lightheaded and dizzy  . Sulfa Antibiotics Hives and Itching  . Tape Other (See Comments)    TAPE WILL TEAR THE SKIN!!!   Review of Systems  Unable to perform ROS: Other    Physical Exam Vitals signs and nursing note reviewed.  Constitutional:      Appearance: Normal appearance.  HENT:     Head: Normocephalic and atraumatic.  Neck:     Musculoskeletal: Normal range of motion.  Cardiovascular:     Rate and Rhythm: Normal rate.  Pulmonary:     Comments: No increased work of breathing at rest Genitourinary:    Comments: Suprapubic catheter Musculoskeletal: Normal range of motion.        General: Swelling present.  Skin:    General: Skin is warm and dry.     Coloration: Skin is pale.  Neurological:     General: No focal deficit present.     Mental Status: He is oriented to person, place, and time.  Psychiatric:        Mood and Affect: Mood  normal.        Behavior: Behavior normal.        Thought Content: Thought content normal.     Vital Signs: BP (!) 125/53 (BP Location: Right Arm)   Pulse 72   Temp 97.9 F (36.6 C) (Oral)   Resp 20   Ht 5\' 5"  (1.651 m)   Wt 69 kg   SpO2 97%   BMI 25.31 kg/m  Pain Scale: 0-10   Pain Score: 0-No pain   SpO2: SpO2: 97 % O2 Device:SpO2: 97 % O2 Flow Rate: .   IO: Intake/output summary:   Intake/Output Summary (Last 24 hours) at 10/05/2018 1632 Last data filed at 10/05/2018 0954 Gross per 24 hour  Intake 1480 ml  Output 2765 ml  Net -1285 ml    LBM: Last BM Date: 10/04/18 Baseline Weight: Weight: 69.9 kg Most recent weight: Weight: 69 kg     Palliative Assessment/Data:   Flowsheet Rows     Most Recent  Value  Intake Tab  Referral Department  Hospitalist  Unit at Time of Referral  Med/Surg Unit  Palliative Care Primary Diagnosis  Nephrology  Date Notified  10/04/18  Reason for referral  Clarify Goals of Care, Psychosocial or Spiritual support  Date of Admission  10/04/18  Date first seen by Palliative Care  10/05/18  # of days Palliative referral response time  1 Day(s)  # of days IP prior to Palliative referral  0  Clinical Assessment  Palliative Performance Scale Score  50%  Pain Max last 24 hours  Not able to report  Pain Min Last 24 hours  Not able to report  Dyspnea Max Last 24 Hours  Not able to report  Dyspnea Min Last 24 hours  Not able to report  Nausea Max Last 24 Hours  Not able to report  Nausea Min Last 24 Hours  Not able to report  Anxiety Max Last 24 Hours  Not able to report  Anxiety Min Last 24 Hours  Not able to report  Other Max Last 24 Hours  Not able to report  Psychosocial & Spiritual Assessment  Palliative Care Outcomes  Patient/Family meeting held?  Yes  Who was at the meeting?  pt, wife  Patient/Family wishes: Interventions discontinued/not started   Mechanical Ventilation, PEG, Tube feedings/TPN, Hemodialysis, Trach, Vasopressors   Palliative Care follow-up planned  Yes, Facility      Time In: 7510 Time Out: 1645 Time Total: 75 min Greater than 50%  of this time was spent counseling and coordinating care related to the above assessment and plan.  Signed by: Dory Horn, NP   Please contact Palliative Medicine Team phone at [phone] for questions and concerns.  For individual provider: See Shea Evans

## 2018-10-05 NOTE — Plan of Care (Signed)
  Problem: Clinical Measurements: Goal: Ability to maintain clinical measurements within normal limits will improve Outcome: Progressing   Problem: Clinical Measurements: Goal: Diagnostic test results will improve Outcome: Progressing   Problem: Clinical Measurements: Goal: Cardiovascular complication will be avoided Outcome: Progressing   Problem: Elimination: Goal: Will not experience complications related to bowel motility Outcome: Progressing

## 2018-10-05 NOTE — Consult Note (Signed)
   [facility] Inpatient Consult   10/05/2018  Ronald [name] 1927-11-19 [id number]   Alert by [place] Management Coordinator regarding patient's readmission less than 30 days with Medicare NGACO.  Patient was being followed for post EMMI red flag call. According to [place] patient's wife returned called regarding hospitalization of several call attempts.  Wife states her husband doesn't hear well.  Primary Care Provider is Dr. Lelon Frohlich, [facility].  This office provides the transition of care follow up. Patient readmitted for shortness of breath, edema, with back pain.  Plan:  Follow for progress and disposition needs.   For questions, contact:  Natividad Brood, RN BSN [facility] Liaison  [phone] business mobile phone Toll free office [phone]  Fax number: [phone] [email or web address]

## 2018-10-05 NOTE — Patient Outreach (Signed)
Ronald [name]) Care Management  10/05/2018  Ronald [name] Jul 13, 1927 [id number]   Subjective: Received voicemail message from Ronald [name], patient's wife, designated party release, states she is returning call for her husband Ronald [name]), and requested call back. Telephone call to patient's home number, spoke with patient's wife Ronald [name]) (designated party release), she stated patient's name, date of birth, and address.   Discussed [name] Care Management Medicare EMMI General Discharge Red Alert Flag follow up, patient voiced understanding, and is in agreement to follow up.  Wife states she remembers patient receiving EMMI automated call, states patient is very hard of hearing, does not wear his hearing aid, she usually answers the phone, she answered call on his behalf, patient had no interest in things at that time due to being in so much pain, she took patient back to the [name] on 10/04/2018, and patient remains inpatient.  RNCM advised since patient is in the [name], will discontinue EMMI automated calls while patient is in the [name], [facility] Liaison will follow up for discharge disposition / planning, and referral follow up as needed, wife voices understanding, and is in agreement.     Objective:Per KPN (Knowledge Performance Now, point of care tool) and chart review,patient hospitalized 09/14/2018 -09/20/2018 forAcute renal failure (ARF),Anemia due to chronic kidney disease. Patient also has a history of CAD status post CABG, Ischemic cardiomyopathy,Chronic atrial fibrillation, chronic kidney disease stage 3, anemia, andProstate cancer with bladder outlet obstruction status post suprapubic catheter.     Assessment: Received Medicare EMMI General Discharge Red Flag Alert follow up referral on 09/26/2018. Red Flag Alert Trigger, Day #1, patient answered yes to the following question: Lost interest in things?EMMI follow up completed,  patient currently inpatient, will close case due to enrolled in external program, RNCM will refer patient to [place] for discharge disposition / planning, and referral follow up.      Plan:RNCMwill send [facility] in-basket referral for patient to [place] (Ronald [name], [place], [place]) for discharge disposition / planning, and referral follow up.   RNCM will send request to discontinue EMMI Automated calls to Ronald [name] at [place] Management. RNCM closed case due to patient enrolled in external program, currently inpatient.         Ronald [name] H. Annia Friendly, BSN, [place] Management [facility] Telephonic CM Phone: [phone] Fax: [phone]

## 2018-10-05 NOTE — Progress Notes (Signed)
PROGRESS NOTE  Ronald [name] IEP:[id number] DOB: [birth year] DOA: 10/04/2018 PCP: Isaac Bliss, [name], MD  Brief History   Ronald [name] is a 85 y.o. male with medical history significant of CAD status post CABG; ischemic cardiomyopathy with EF 40-45% on 6/3 echo; A. fib on Eliquis;chronic kidney disease stage 3 (baseline creatinine 3); and prostate cancer with suprapubic catheter presenting with edema.  "I wish I knew... You see, what's happening.  My wife and these doctors and nurses have made these appointments and they've come up with an idea that I've got a kidney problem.  They's supposed to draw some fluids off my legs and stuff because they getting too heavy.  Got that?  Well, they aren't drawn that off yet... They have done everything but since I've been since here."  +SOB.  No CP.     He was last admitted from 6/19-25 for AKI on stage 3 CKD with metabolic acidosis and suprapubic catheter-associated UTI with Stenotrophomonas.     ED Course:  Saw nephrology in f/u yesterday.  Creatinine increased.  His wife is concerned about suprapubic pain and abdominal pain and so he was sent to ER.  C/o abdominal pain, as well as chest pain with pleuritic component. Creatinine 4.8, prior 3.  Also some fluid overload on exam with 2+ edema, mild crackles, PND.  CXR clear.  Nephrology to see, Dr. Royce Macadamia.  Not given Lasix, on room air without respiratory concerns.  The patient was admitted to a medical bed. Upon admission the patient was complaining that his back pain was so severe that he wanted to die in order for it to end. He was placed on pain control. His suprapubic catheter was changed out. Nephrology was consulted. The patient expressed his desire not to go to dialysis. His overload was addressed with diuresis.   Consultants  . Palliative care . Nephrology  Procedures  . None  Antibiotics   Anti-infectives (From admission, onward)   None    .  Marland Kitchen   Subjective  The patient is in  good spirits this morning and refusing any pain. No new complaints.  Objective   Vitals:  Vitals:   10/04/18 1945 10/05/18 0416  BP: (!) 144/75 (!) 125/53  Pulse: 71 72  Resp: 18 20  Temp: (!) 97.5 F (36.4 C) 97.9 F (36.6 C)  SpO2: 100% 97%    Exam:  Constitutional:  . The patient is awake, alert, and oriented x 3. No acute distress. Respiratory:  . No wheezes, rales, or rhonchi . No tactile fremitus. . No increased work of breathing. Cardiovascular:  . Regular rate and rhythm . No murmurs, ectopy, and gallups. . No lateral PMI. No thrills. Abdomen:  . Abdomen is soft, non-tender, non-distended . No hernias, masses, or organomegaly . Normoactive bowel sounds. Musculoskeletal:  . No cyanosis, clubbing, or edema Skin:  . No rashes, lesions, ulcers . palpation of skin: no induration or nodules Neurologic:  . CN 2-12 intact . Sensation all 4 extremities intact Psychiatric:  . Mental status o Mood, affect appropriate o Orientation to person, place, time  . judgment and insight appear intact   I have personally reviewed the following:   Today's Data  . Vitals, BMP, CBC  Imaging  . CXR: Thoracic compression fractures.  Scheduled Meds: . apixaban  2.5 mg Oral BID  . atorvastatin  40 mg Oral QPM  . isosorbide mononitrate  15 mg Oral Daily  . pantoprazole  40 mg Oral Daily  .  sodium chloride flush  3 mL Intravenous Once   Continuous Infusions:  Principal Problem:   Cardiorenal syndrome Active Problems:   HTN (hypertension)   History of prostate cancer   Chronic combined systolic (congestive) and diastolic (congestive) heart failure (HCC)   PAF (paroxysmal atrial fibrillation) (HCC)   Acute on chronic renal failure (HCC)   Anemia due to chronic kidney disease   Compression fracture of thoracic spine, non-traumatic, initial encounter (HCC)   Goals of care, counseling/discussion   Chronic bilateral thoracic back pain   Shortness of breath   Palliative  care by specialist   LOS: 1 day   A & P  Cardiorenal syndrome: He was due to see nephrology tomorrow; they have been discussing palliative care options and he was scheduled to see Dr. Royce Macadamia  tomorrow for f/u.  The patient presented to the ED due to unrelenting pain and was found to be in fluid overload with severe edema. With his h/o both CKD and CHF, this is likely cardiorenal syndrome. Nephrology has been consulted, and the patient has stated that he does not want dialysis. An attempt to remove volume is being made with diuresis with lasix. If this does not resolve the situation, he may be a candidate for acute hospice due to volume overload and worsening renal failure. Palliative care has seen the patient. The patient is amenable to hospice.   Acute renal failure on stage 4-5 CKD with anemia: Baseline creatinine appears to be 3 but in the last week has increased to 4.8. 5.04 this morning. He is in volume overload and an attempt to diurese the patient with lasix is being made. Renal ultrasound revealed bilateral hydronephrosis. The patient had his suprapubic catheter exchanged which appears to have relieved the obstruction.  Chronic combined CHF: 6/3 echo with ER 40-45%. This study was performed without contrast. There is an order to repeat the study with contrast to obtain a more reliable estimate of EF. Palliative care consultation, likely for end of life care.  Afib: Rate is controlled without rate limiting medications. He is receiving Eliquis for CVA prophylaxis.  Compression fractures of the thoracic spine: The patient was complaining of severe pain yesterday. Today he denies any pain. I have discussed the patient with IR to see if the patient might be a candidate for vertebroplasty. I will first get a PT eval to see how much the patient is actually having from thoracic compression fractures. If it is felt to be significant will proceed with an MRI to determine chronicity of lesions. If  they are not chronic will again discuss with IR for possible intervention. For now will give Oxy IR for breakthrough pain.  HTN: Continue Imdur but he is not on other BP medications at this time. Blood pressures are well controlled.  H/o prostate CA, with suprapubic catheter: Obstruction of catheter is likely behind obstructive uropathy and hydronephrosis on renal ultrasound. It has been exchanged. Monitor.  Note: This patient has been tested and is negative for the novel coronavirus COVID-19.  I have seen and examined this patient myself. I have spent 42 minutes in his evaluation and care. More than 50% of this was spent in coordination of care with IR.  DVT prophylaxis: Eliquis Code Status:  DNR - confirmed with patient Family Communication: Dr. Royce Macadamia spoke with the patient's wife by telephone Disposition Plan:  Home once clinically improved   Mattisyn Cardona, DO [facility] Direct contact: see [email or web address]  7PM-7AM contact night coverage as above 10/05/2018, 5:36  PM  LOS: 1 day

## 2018-10-05 NOTE — Progress Notes (Signed)
  [facility] Progress Note   Assessment/ Plan:   # AKI on CKD Stage III - baseline Cr 1.3-1.5 prior to 08/2018 - Recent recurrent AKI with hx obstructive uropathy.  His AKI had been resolving with SPC exchange.  Had also been hydrated with lasix suspended that admission and then later resumed on PRN basis and recently transitioned low dose scheduled lasix.  - May represent CKD progression in light of recent AKI events  - Renal US with persistent bilateral hydro; suprapubic catheter is in place and appears to be draining  - The patient has indicated that he would not want dialysis--> he confirms this to me and to pall care this afternoon    # Obstructive uropathy - Chronic suprapubic catheter is in place and draining  - discussed with primary nephrologist (Dr. Royce Macadamia).  Per her discussions with urology, only other option is to place bilateral PCNs.   - he is agreeable to try to flush suprapubic catheter today  # Acute on chronic systolic CHF  - Lasix 60 mg IV once, robust diuresis noted.   - will hold off on further dosing for now   # Abdominal pain - Renal ultrasound as above to r/o any contributing component   # Compression fractures - Numerous compression fractures incidentally noted on CXR  - Primary team is initiating pain control regimen - appreciate their assistance   # Anemia  - Secondary in part to AKI/CKD  - Aranesp once today - Outpt iron checked at 57 and 22% sat - Feraheme x 1   # Dispo: greatly appreciate palliative care c/s  Subjective:    Seen in room.  Meeting with palliative care.  Cr up to 5.0.  Agreeable to having suprapubic cath flushed   Objective:   BP (!) 125/53 (BP Location: Right Arm)   Pulse 72   Temp 97.9 F (36.6 C) (Oral)   Resp 20   Ht 5\' 5"  (1.651 m)   Wt 69 kg   SpO2 97%   BMI 25.31 kg/m   Physical Exam: Gen: elderly gentleman, NAD, sitting in chair CVS: RRR no m/r/g Resp: clear bilaterally GU: + suprapubic  cath Ext: no LE edema  Labs: BMET Recent Labs  Lab 10/04/18 1147 10/04/18 1252 10/05/18 0415  NA 139 135 141  K 4.4 4.4 4.0  CL 106 103 107  CO2 20* 20* 22  GLUCOSE 120* 117* 124*  BUN 60* 60* 64*  CREATININE 4.80* 4.79* 5.04*  CALCIUM 8.8* 8.7* 8.9   CBC Recent Labs  Lab 10/04/18 1147 10/05/18 0415  WBC 6.2 6.2  HGB 8.1* 7.9*  HCT 25.3* 23.9*  MCV 98.4 94.5  PLT 115* 113*    @IMGRELPRIORS @ Medications:    . apixaban  2.5 mg Oral BID  . atorvastatin  40 mg Oral QPM  . isosorbide mononitrate  15 mg Oral Daily  . pantoprazole  40 mg Oral Daily  . sodium chloride flush  3 mL Intravenous Once     Madelon Lips, MD [facility] pgr [phone] 10/05/2018, 4:28 PM

## 2018-10-05 NOTE — Evaluation (Addendum)
Physical Therapy Evaluation Patient Details Name: Ronald [name] MRN: [id number] DOB: [birth year] Today's Date: 10/05/2018   History of Present Illness  Patient is a 85 y/o male who presents with SOB and swelling. Admitted with Recent recurrent AKI with hx obstructive uropathy and acute on chronic CHF. Recently admitted 6/19-6/25 for AKI and UTI. CXR-thoracic compression fxs. PMh includes chronic cystitis and urolithiasis, stage II CKD, CAD/CABG, chronic systolic CHF, HTN, HLD, mild dementia, A-fib, UTIs, sepsis.  Clinical Impression  Patient presents with mild balance deficits, bil feet swelling, SOB and impaired mobility s/p above. Pt Mod I PTA and is a furniture walker for household ambulation and uses SPC for community distances. Has supportive wife. Today, pt is supervision-Min guard for transfers and ambulation. 1/4 DOE noted. Encouraged walking a few times daily with nursing to improve strength/mobility. Will follow acutely to maximize independence and mobility prior to return home.     Follow Up Recommendations Home health PT;Supervision - Intermittent    Equipment Recommendations  None recommended by PT    Recommendations for Other Services       Precautions / Restrictions Precautions Precautions: Fall Restrictions Weight Bearing Restrictions: No      Mobility  Bed Mobility               General bed mobility comments: Up in chair upon PT arrival.  Transfers Overall transfer level: Needs assistance Equipment used: Rolling walker (2 wheeled) Transfers: Sit to/from Stand Sit to Stand: Supervision         General transfer comment: Supervision for safety.  Ambulation/Gait Ambulation/Gait assistance: Min guard Gait Distance (Feet): 150 Feet Assistive device: Rolling walker (2 wheeled) Gait Pattern/deviations: Step-through pattern;Decreased stride length;Trunk flexed Gait velocity: decreased   General Gait Details: Slow, mostly steady gait with RW for support;  decreased foot clearance on right. Cues for RW management.  Stairs            Wheelchair Mobility    Modified Rankin (Stroke Patients Only)       Balance Overall balance assessment: Needs assistance;History of Falls Sitting-balance support: Feet supported;No upper extremity supported Sitting balance-Leahy Scale: Good     Standing balance support: During functional activity Standing balance-Leahy Scale: Fair Standing balance comment: Able to stand statically and donn mask without UE support, "look at that balance."                             Pertinent Vitals/Pain Pain Assessment: No/denies pain    Home Living Family/patient expects to be discharged to:: Private residence Living Arrangements: Spouse/significant other Available Help at Discharge: Family;Available 24 hours/day Type of Home: House Home Access: Stairs to enter Entrance Stairs-Rails: Left Entrance Stairs-Number of Steps: 3 Home Layout: One level Home Equipment: Walker - 2 wheels;Cane - single point      Prior Function Level of Independence: Independent with assistive device(s)         Comments: Furniture walker at baseline; uses SPC for community distances,     Engineer, manufacturing Dominance   Dominant Hand: Right    Extremity/Trunk Assessment   Upper Extremity Assessment Upper Extremity Assessment: Defer to OT evaluation    Lower Extremity Assessment Lower Extremity Assessment: Overall WFL for tasks assessed    Cervical / Trunk Assessment Cervical / Trunk Assessment: Kyphotic  Communication   Communication: HOH  Cognition Arousal/Alertness: Awake/alert Behavior During Therapy: WFL for tasks assessed/performed Overall Cognitive Status: Within Functional Limits for tasks assessed  General Comments General comments (skin integrity, edema, etc.): Pitting edema present in bil feet.    Exercises     Assessment/Plan    PT  Assessment Patient needs continued PT services  PT Problem List Decreased mobility;Decreased safety awareness;Decreased balance;Decreased knowledge of use of DME       PT Treatment Interventions Therapeutic activities;Gait training;Therapeutic exercise;Patient/family education;Balance training;Functional mobility training;DME instruction    PT Goals (Current goals can be found in the Care Plan section)  Acute Rehab PT Goals Patient Stated Goal: to be able to get up and walk whenever i want and go home PT Goal Formulation: With patient Time For Goal Achievement: 10/19/18 Potential to Achieve Goals: Good    Frequency Min 3X/week   Barriers to discharge        Co-evaluation               AM-PAC PT "6 Clicks" Mobility  Outcome Measure Help needed turning from your back to your side while in a flat bed without using bedrails?: A Little Help needed moving from lying on your back to sitting on the side of a flat bed without using bedrails?: A Little Help needed moving to and from a bed to a chair (including a wheelchair)?: None Help needed standing up from a chair using your arms (e.g., wheelchair or bedside chair)?: None Help needed to walk in hospital room?: A Little Help needed climbing 3-5 steps with a railing? : A Little 6 Click Score: 20    End of Session Equipment Utilized During Treatment: Gait belt Activity Tolerance: Patient tolerated treatment well Patient left: in chair;with call bell/phone within reach;with chair alarm set Nurse Communication: Mobility status PT Visit Diagnosis: Unsteadiness on feet (R26.81)    Time: 0981-1914 PT Time Calculation (min) (ACUTE ONLY): 23 min   Charges:   PT Evaluation $PT Eval Moderate Complexity: 1 Mod PT Treatments $Gait Training: 8-22 mins        Wray Kearns, PT, DPT Acute Rehabilitation Services Pager [phone] Office [phone]      Marguarite Arbour A Sabra Heck 10/05/2018, 2:28 PM

## 2018-10-05 NOTE — Telephone Encounter (Signed)
Copied from [place] [id number]. Topic: General - Other >> Oct 05, 2018  8:54 AM Keene Breath wrote: Reason for CRM: Merry Proud with [facility] called for verbal orders for Home Health PT - 2x wk for  2 wks, 1x wk for 1 wk, once every other week for 3 wks.   If there are any questions, please call at [phone]

## 2018-10-06 DIAGNOSIS — N139 Obstructive and reflux uropathy, unspecified: Secondary | ICD-10-CM

## 2018-10-06 LAB — CBC WITH DIFFERENTIAL/PLATELET
Abs Immature Granulocytes: 0.02 10*3/uL (ref 0.00–0.07)
Basophils Absolute: 0 10*3/uL (ref 0.0–0.1)
Basophils Relative: 0 %
Eosinophils Absolute: 0.2 10*3/uL (ref 0.0–0.5)
Eosinophils Relative: 2 %
HCT: 25.3 % — ABNORMAL LOW (ref 39.0–52.0)
Hemoglobin: 8.3 g/dL — ABNORMAL LOW (ref 13.0–17.0)
Immature Granulocytes: 0 %
Lymphocytes Relative: 14 %
Lymphs Abs: 0.9 10*3/uL (ref 0.7–4.0)
MCH: 31.3 pg (ref 26.0–34.0)
MCHC: 32.8 g/dL (ref 30.0–36.0)
MCV: 95.5 fL (ref 80.0–100.0)
Monocytes Absolute: 0.9 10*3/uL (ref 0.1–1.0)
Monocytes Relative: 13 %
Neutro Abs: 4.7 10*3/uL (ref 1.7–7.7)
Neutrophils Relative %: 71 %
Platelets: 122 10*3/uL — ABNORMAL LOW (ref 150–400)
RBC: 2.65 MIL/uL — ABNORMAL LOW (ref 4.22–5.81)
RDW: 14.5 % (ref 11.5–15.5)
WBC: 6.7 10*3/uL (ref 4.0–10.5)
nRBC: 0 % (ref 0.0–0.2)

## 2018-10-06 LAB — BASIC METABOLIC PANEL
Anion gap: 11 (ref 5–15)
BUN: 67 mg/dL — ABNORMAL HIGH (ref 8–23)
CO2: 22 mmol/L (ref 22–32)
Calcium: 9.2 mg/dL (ref 8.9–10.3)
Chloride: 110 mmol/L (ref 98–111)
Creatinine, Ser: 5.29 mg/dL — ABNORMAL HIGH (ref 0.61–1.24)
GFR calc Af Amer: 10 mL/min — ABNORMAL LOW (ref >60)
GFR calc non Af Amer: 9 mL/min — ABNORMAL LOW (ref >60)
Glucose, Bld: 118 mg/dL — ABNORMAL HIGH (ref 70–99)
Potassium: 4.6 mmol/L (ref 3.5–5.1)
Sodium: 143 mmol/L (ref 135–145)

## 2018-10-06 MED ORDER — POLYVINYL ALCOHOL 1.4 % OP SOLN
1.0000 [drp] | OPHTHALMIC | Status: DC | PRN
  Administered 2018-10-06: 1 [drp] via OPHTHALMIC
  Filled 2018-10-06: qty 15

## 2018-10-06 NOTE — Telephone Encounter (Signed)
OK, but I think plan was to discuss hospice and end of life care, in which case PT is redundant. Has the family made any decisions? I know they wanted to wait to speak with cardiology first..Marland Kitchen

## 2018-10-06 NOTE — Progress Notes (Signed)
PROGRESS NOTE  [name] UXL:[id number] DOB: 15-Nov-1927 DOA: 10/04/2018 PCP: Isaac Bliss, [name], MD  Brief History   Ronald [name] is a 85 y.o. male with medical history significant of CAD status post CABG; ischemic cardiomyopathy with EF 40-45% on 6/3 echo; A. fib on Eliquis;chronic kidney disease stage 3 (baseline creatinine 3); and prostate cancer with suprapubic catheter presenting with edema.  "I wish I knew... You see, what's happening.  My wife and these doctors and nurses have made these appointments and they've come up with an idea that I've got a kidney problem.  They's supposed to draw some fluids off my legs and stuff because they getting too heavy.  Got that?  Well, they aren't drawn that off yet... They have done everything but since I've been since here."  +SOB.  No CP.     He was last admitted from 6/19-25 for AKI on stage 3 CKD with metabolic acidosis and suprapubic catheter-associated UTI with Stenotrophomonas.     ED Course:  Saw nephrology in f/u yesterday.  Creatinine increased.  His wife is concerned about suprapubic pain and abdominal pain and so he was sent to ER.  C/o abdominal pain, as well as chest pain with pleuritic component. Creatinine 4.8, prior 3. Also some fluid overload on exam with 2+ edema, mild crackles, PND.  CXR clear.  Nephrology to see, Dr. Royce Macadamia. Not given Lasix, on room air without respiratory concerns.  The patient was admitted to a medical bed. Upon admission the patient was complaining that his back pain was so severe that he wanted to die in order for it to end. He was placed on pain control. His suprapubic catheter was changed out. Nephrology was consulted. The patient expressed his desire not to go to dialysis. His overload was addressed with diuresis.   Consultants  . Palliative care . Nephrology  Procedures  . None  Antibiotics   Anti-infectives (From admission, onward)   None      Subjective  The patient is in good  spirits this morning and refusing any pain. No new complaints.  Objective   Vitals:  Vitals:   10/06/18 0600 10/06/18 1503  BP: (!) 142/78 (!) 162/84  Pulse: 75 71  Resp: 16   Temp: 97.6 F (36.4 C) (!) 97.4 F (36.3 C)  SpO2: 96% 100%    Exam:  Constitutional:  . The patient is awake, alert, and oriented x 3. No acute distress. Respiratory:  . No wheezes, rales, or rhonchi . No tactile fremitus. . No increased work of breathing. Cardiovascular:  . Regular rate and rhythm . No murmurs, ectopy, and gallups. . No lateral PMI. No thrills. Abdomen:  . Abdomen is soft, non-tender, non-distended . No hernias, masses, or organomegaly . Normoactive bowel sounds. Musculoskeletal:  . No cyanosis, clubbing, or edema Skin:  . No rashes, lesions, ulcers . palpation of skin: no induration or nodules Neurologic:  . CN 2-12 intact . Sensation all 4 extremities intact Psychiatric:  . Mental status o Mood, affect appropriate o Orientation to person, place, time  . judgment and insight appear intact   I have personally reviewed the following:   Today's Data  . Vitals, BMP, CBC  Imaging  . CXR: Thoracic compression fractures.  Scheduled Meds: . apixaban  2.5 mg Oral BID  . atorvastatin  40 mg Oral QPM  . isosorbide mononitrate  15 mg Oral Daily  . pantoprazole  40 mg Oral Daily  . sodium chloride flush  3 mL Intravenous Once   Continuous Infusions:  Principal Problem:   Cardiorenal syndrome Active Problems:   HTN (hypertension)   History of prostate cancer   Chronic combined systolic (congestive) and diastolic (congestive) heart failure (HCC)   PAF (paroxysmal atrial fibrillation) (HCC)   Acute on chronic renal failure (HCC)   Anemia due to chronic kidney disease   Compression fracture of thoracic spine, non-traumatic, initial encounter (HCC)   Goals of care, counseling/discussion   Chronic bilateral thoracic back pain   Shortness of breath   Palliative care by  specialist   LOS: 2 days   A & P  Cardiorenal syndrome: He was due to see nephrology tomorrow; they have been discussing palliative care options and he was scheduled to see Dr. Royce Macadamia  tomorrow for f/u.  The patient presented to the ED due to unrelenting pain and was found to be in fluid overload with severe edema. With his h/o both CKD and CHF, this is likely cardiorenal syndrome. Nephrology has been consulted, and the patient has stated that he does not want dialysis. An attempt to remove volume is being made with diuresis with lasix. If this does not resolve the situation, he may be a candidate for acute hospice due to volume overload and worsening renal failure. Palliative care has seen the patient. The patient is amenable to hospice.  Acute renal failure on stage 4-5 CKD with anemia: Baseline creatinine appears to be 3 but in the last week has increased to 4.8. 5.04 this morning. He is in volume overload and an attempt to diurese the patient with lasix is being made. Renal ultrasound revealed bilateral hydronephrosis. The patient had his suprapubic catheter exchanged in June which appears to have relieved the obstruction. He has asked to have this evaluated by Urology. Will consult them in the am.  Chronic combined CHF: 6/3 echo with ER 40-45%. This study was performed without contrast. There is an order to repeat the study with contrast to obtain a more reliable estimate of EF. Palliative care consultation, likely for end of life care.  Afib: Rate is controlled without rate limiting medications. He is receiving Eliquis for CVA prophylaxis.  Compression fractures of the thoracic spine: The patient was complaining of severe pain yesterday. Today he denies any pain. I have discussed the patient with IR to see if the patient might be a candidate for vertebroplasty. I will first get a PT eval to see how much the patient is actually having from thoracic compression fractures. If it is felt to be  significant will proceed with an MRI to determine chronicity of lesions. If they are not chronic will again discuss with IR for possible intervention. For now will give Oxy IR for breakthrough pain. The patient has been evaluated by PT. There is no mention of pain limiting his activity in their note. Will hold off on MRI to evaluate for appropriateness of vertebroplasty, as there is currently no evidence of debilitating pain.  HTN: Continue Imdur but he is not on other BP medications at this time. Blood pressures are well controlled.  H/o prostate CA, with suprapubic catheter: Obstruction of catheter is likely behind obstructive uropathy and hydronephrosis on renal ultrasound. It has been exchanged. Monitor.  Note: This patient has been tested and is negative for the novel coronavirus COVID-19.  I have seen and examined this patient myself. I have spent 34 minutes in his evaluation and care.   DVT prophylaxis: Eliquis Code Status:  DNR - confirmed with  patient Family Communication: Dr. Royce Macadamia spoke with the patient's wife by telephone Disposition Plan:  Home once clinically improved   Dimples Probus, DO [facility] Direct contact: see [email or web address]  7PM-7AM contact night coverage as above 10/06/2018, 4:22 PM  LOS: 1 day

## 2018-10-06 NOTE — Progress Notes (Signed)
[facility] Progress Note   Assessment/ Plan:   # AKI on CKD Stage III - baseline Cr 1.3-1.5 prior to 08/2018 - Recent recurrent AKI with hx obstructive uropathy.  His AKI had been resolving with SPC exchange.  Had also been hydrated with lasix suspended that admission and then later resumed on PRN basis and recently transitioned low dose scheduled lasix.  - May represent CKD progression in light of recent AKI events  - Renal US with persistent bilateral hydro; suprapubic catheter is in place and appears to be draining-- 2.8L overnight  - The patient has indicated that he would not want dialysis--> he confirms this to me and to pall care this afternoon    # Obstructive uropathy - Chronic suprapubic catheter is in place and draining  - discussed with primary nephrologist (Dr. Royce Macadamia).  Per her discussions with urology, other option is to place bilateral PCNs.   - he is agreeable to try to flush suprapubic catheter today - had SPC changed in late June he says, he is requesting that urology come look at it, ? If needs to be changed early  # Acute on chronic systolic CHF  - Lasix 60 mg IV once, robust diuresis noted.   - will hold off on further dosing for now   # Abdominal pain - Renal ultrasound as above to r/o any contributing component--> negative  # Compression fractures - Numerous compression fractures incidentally noted on CXR  - Primary team is initiating pain control regimen - appreciate their assistance  - ? kyphoplasty   # Anemia  - Secondary in part to AKI/CKD  - Aranesp given - Outpt iron checked at 57 and 22% sat - Feraheme x 1   # Dispo: greatly appreciate palliative care c/s.  I think if there aren't any fixable causes here of progression of CKD/ AKI on CKD hospice is appropriate  Subjective:    S/p suprapubic catheter flushing, had bladder spasms all night.  Cr 5.2 this AM.  Weight is down, catheter is draining a lot of urine, 2.8L yesterday.    Objective:   BP (!) 142/78 (BP Location: Left Arm)   Pulse 75   Temp 97.6 F (36.4 C) (Oral)   Resp 16   Ht 5\' 5"  (1.651 m)   Wt 65.7 kg   SpO2 96%   BMI 24.10 kg/m   Physical Exam: Gen: elderly gentleman, NAD, sitting in chair CVS: RRR no m/r/g Resp: clear bilaterally GU: + suprapubic cath Ext: trace pedal edema  Labs: BMET Recent Labs  Lab 10/04/18 1147 10/04/18 1252 10/05/18 0415 10/06/18 0400  NA 139 135 141 143  K 4.4 4.4 4.0 4.6  CL 106 103 107 110  CO2 20* 20* 22 22  GLUCOSE 120* 117* 124* 118*  BUN 60* 60* 64* 67*  CREATININE 4.80* 4.79* 5.04* 5.29*  CALCIUM 8.8* 8.7* 8.9 9.2   CBC Recent Labs  Lab 10/04/18 1147 10/05/18 0415 10/06/18 0400  WBC 6.2 6.2 6.7  NEUTROABS  --   --  4.7  HGB 8.1* 7.9* 8.3*  HCT 25.3* 23.9* 25.3*  MCV 98.4 94.5 95.5  PLT 115* 113* 122*    @IMGRELPRIORS @ Medications:    . apixaban  2.5 mg Oral BID  . atorvastatin  40 mg Oral QPM  . isosorbide mononitrate  15 mg Oral Daily  . pantoprazole  40 mg Oral Daily  . sodium chloride flush  3 mL Intravenous Once     Madelon Lips, MD [facility] pgr [phone] 10/06/2018, 11:00 AM

## 2018-10-07 DIAGNOSIS — N3289 Other specified disorders of bladder: Secondary | ICD-10-CM

## 2018-10-07 LAB — CBC WITH DIFFERENTIAL/PLATELET
Abs Immature Granulocytes: 0.05 10*3/uL (ref 0.00–0.07)
Basophils Absolute: 0 10*3/uL (ref 0.0–0.1)
Basophils Relative: 0 %
Eosinophils Absolute: 0.1 10*3/uL (ref 0.0–0.5)
Eosinophils Relative: 1 %
HCT: 25.1 % — ABNORMAL LOW (ref 39.0–52.0)
Hemoglobin: 8.2 g/dL — ABNORMAL LOW (ref 13.0–17.0)
Immature Granulocytes: 1 %
Lymphocytes Relative: 11 %
Lymphs Abs: 0.8 10*3/uL (ref 0.7–4.0)
MCH: 31.5 pg (ref 26.0–34.0)
MCHC: 32.7 g/dL (ref 30.0–36.0)
MCV: 96.5 fL (ref 80.0–100.0)
Monocytes Absolute: 0.9 10*3/uL (ref 0.1–1.0)
Monocytes Relative: 13 %
Neutro Abs: 5.2 10*3/uL (ref 1.7–7.7)
Neutrophils Relative %: 74 %
Platelets: 126 10*3/uL — ABNORMAL LOW (ref 150–400)
RBC: 2.6 MIL/uL — ABNORMAL LOW (ref 4.22–5.81)
RDW: 14.6 % (ref 11.5–15.5)
WBC: 7.1 10*3/uL (ref 4.0–10.5)
nRBC: 0 % (ref 0.0–0.2)

## 2018-10-07 LAB — BASIC METABOLIC PANEL
Anion gap: 11 (ref 5–15)
BUN: 66 mg/dL — ABNORMAL HIGH (ref 8–23)
CO2: 22 mmol/L (ref 22–32)
Calcium: 9.1 mg/dL (ref 8.9–10.3)
Chloride: 109 mmol/L (ref 98–111)
Creatinine, Ser: 5.6 mg/dL — ABNORMAL HIGH (ref 0.61–1.24)
GFR calc Af Amer: 10 mL/min — ABNORMAL LOW (ref >60)
GFR calc non Af Amer: 8 mL/min — ABNORMAL LOW (ref >60)
Glucose, Bld: 127 mg/dL — ABNORMAL HIGH (ref 70–99)
Potassium: 4.3 mmol/L (ref 3.5–5.1)
Sodium: 142 mmol/L (ref 135–145)

## 2018-10-07 MED ORDER — FLAVOXATE HCL 100 MG PO TABS: 100.0000 mg | ORAL_TABLET | Freq: Three times a day (TID) | ORAL | 0 refills | Status: AC | PRN

## 2018-10-07 MED ORDER — SORBITOL 70 % SOLN: 30.0000 mL | 0 refills | Status: AC | PRN

## 2018-10-07 MED ORDER — NEPRO/CARBSTEADY PO LIQD: 237.0000 mL | Freq: Three times a day (TID) | ORAL | 0 refills | Status: AC | PRN

## 2018-10-07 MED ORDER — FLAVOXATE HCL 100 MG PO TABS: 100.0000 mg | ORAL_TABLET | Freq: Three times a day (TID) | ORAL | Status: DC | PRN

## 2018-10-07 MED ORDER — OXYCODONE HCL 5 MG PO TABS: 5.0000 mg | ORAL_TABLET | ORAL | 0 refills | Status: AC | PRN

## 2018-10-07 NOTE — Discharge Summary (Signed)
Physician Discharge Summary  Ronald [name] TIW:[id number] DOB: 1927-10-10 DOA: 10/04/2018  PCP: Isaac Bliss, [name], MD  Admit date: 10/04/2018 Discharge date: 10/07/2018  Recommendations for Outpatient Follow-up:  1. Home with hospice care. 2. Follow up with urology within one week for supra-pubic catheter care. 3. Follow up with PCP in 7-10 days.  Follow-up Information    Schedule an appointment as soon as possible for a visit  with Isaac Bliss, [name], MD.   Specialty: Internal Medicine Contact information: [place] [address] [phone]          Discharge Diagnoses: Principal diagnosis is #1 1. AKI on CKD due to obstructive uropathy. Pt does not want dialysis. 2. Volume overload. 3. Acute on chronic systolic CHF 4. Cardiorenal syndrome 5. Atrial fibrillation 6. Compression fractures of the thoracic spine.  Discharge Condition: Fair  Disposition: Home with hospice.   Diet recommendation: Heart healthy with 1500 cc fluid restriction.  Filed Weights   10/04/18 1803 10/06/18 0600 10/07/18 0600  Weight: 69 kg 65.7 kg 65.1 kg    History of present illness:  Ronald [name] is a 85 y.o. male with medical history significant of CAD status post CABG; ischemic cardiomyopathy with EF 40-45% on 6/3 echo; A. fib on Eliquis;chronic kidney disease stage 3 (baseline creatinine 3); and prostate cancer with suprapubic catheter presenting with edema.  "I wish I knew... You see, what's happening.  My wife and these doctors and nurses have made these appointments and they've come up with an idea that I've got a kidney problem.  They's supposed to draw some fluids off my legs and stuff because they getting too heavy.  Got that?  Well, they aren't drawn that off yet... They have done everything but since I've been since here."  +SOB.  No CP.     He was last admitted from 6/19-25 for AKI on stage 3 CKD with metabolic acidosis and suprapubic  catheter-associated UTI with Stenotrophomonas.     Hospital Course:  Ronald [name] a 85 y.o.malewith medical history significant ofCAD status post CABG;ischemic cardiomyopathy with EF 40-45% on 6/3 echo;A. fib on Eliquis;chronic kidney diseasestage 3 (baseline creatinine 3); andprostate cancer with suprapubic catheter presenting with edema."I wish I knew... You see, what's happening. My wife and these doctors and nurses have made these appointments and they've come up with an idea that I've got a kidney problem. They's supposed to draw some fluids off my legs and stuff because they getting too heavy. Got that? Well, they aren't drawn that off yet... They have done everything but since I've been since here." +SOB. No CP.   He was last admitted from 6/19-25 for AKI on stage 3 CKD with metabolic acidosis and suprapubic catheter-associated UTI with Stenotrophomonas.  ED Course:Saw nephrology in f/u yesterday. Creatinine increased. His wife is concerned about suprapubic pain and abdominal pain and so he was sent to ER. C/o abdominal pain, as well as chest pain with pleuritic component. Creatinine 4.8, prior 3. Also some fluid overload on exam with 2+ edema, mild crackles, PND. CXR clear. Nephrology to see, Dr. Royce Macadamia. Not given Lasix, on room air without respiratory concerns.  The patient was admitted to a medical bed. Upon admission the patient was complaining that his back pain was so severe that he wanted to die in order for it to end. He was placed on pain control. His suprapubic catheter was changed out. Nephrology was consulted. The patient expressed his desire not to  go to dialysis. His overload was addressed with diuresis.   Upon admission it was noted that the patient was complaining of severe pain which was assumed was related to his compression fractures that were of unknown chronicity. For me the patient has denied back pain on each visit. He has been evaluated by  PT/OT. His level of participation and activity was not noted to have been limited by discomfort of any kind. For this reason investigation of the compression fractures was not pursued by MRI and Interventional Radiology.  Today's assessment: S: The patient is sitting up at bedside. He is happy to hear that he is going home. O: Vitals:  Vitals:   10/07/18 0429 10/07/18 1402  BP: (!) 131/56 (!) 143/87  Pulse: 72 67  Resp: 18   Temp: 98 F (36.7 C) 97.6 F (36.4 C)  SpO2: 99% 99%   Constitutional:   The patient is awake, alert, and oriented x 3. No acute distress. Respiratory:   No wheezes, rales, or rhonchi  No tactile fremitus.  No increased work of breathing. Cardiovascular:   Regular rate and rhythm  No murmurs, ectopy, and gallups.  No lateral PMI. No thrills. Abdomen:   Abdomen is soft, non-tender, non-distended  No hernias, masses, or organomegaly  Normoactive bowel sounds. Musculoskeletal:   No cyanosis, clubbing, or edema Skin:   No rashes, lesions, ulcers  palpation of skin: no induration or nodules Neurologic:   CN 2-12 intact  Sensation all 4 extremities intact Psychiatric:   Mental status ? Mood, affect appropriate ? Orientation to person, place, time   judgment and insight appear intact   Discharge Instructions  Discharge Instructions    Activity as tolerated - No restrictions   Complete by: As directed    Call MD for:  persistant nausea and vomiting   Complete by: As directed    Call MD for:  severe uncontrolled pain   Complete by: As directed    Diet - low sodium heart healthy   Complete by: As directed    1500 fluid restriction.   Discharge instructions   Complete by: As directed    Follow up with hospice for home care. Follow up with PCP in 7-10 days. Follow up with [facility] in one week for care of suprapubic catheter.   Increase activity slowly   Complete by: As directed      Allergies as of 10/07/2018       Reactions   Lidocaine Shortness Of Breath, Swelling   Mouth swelling   Pantoprazole Other (See Comments)   Made the patient lightheaded and dizzy   Nitrofurantoin Hives, Other (See Comments)   Also made the patient lightheaded and dizzy   Sulfa Antibiotics Hives, Itching   Tape Other (See Comments)   TAPE WILL TEAR THE SKIN!!!      Medication List    STOP taking these medications   meclizine 12.5 MG tablet Commonly known as: ANTIVERT     TAKE these medications   acetaminophen 500 MG tablet Commonly known as: TYLENOL Take 500 mg by mouth every 6 (six) hours as needed for mild pain, fever or headache.   atorvastatin 80 MG tablet Commonly known as: LIPITOR Take 0.5 tablets (40 mg total) by mouth every evening.   Eliquis 5 MG Tabs tablet Generic drug: apixaban Take 2.5 mg by mouth 2 (two) times daily.   feeding supplement (NEPRO CARB STEADY) Liqd Take 237 mLs by mouth 3 (three) times daily as needed (Supplement).  flavoxATE 100 MG tablet Commonly known as: URISPAS Take 1 tablet (100 mg total) by mouth 3 (three) times daily as needed for bladder spasms (1st dose now).   furosemide 20 MG tablet Commonly known as: LASIX Take 1 tablet (20 mg total) by mouth daily.   isosorbide mononitrate 30 MG 24 hr tablet Commonly known as: IMDUR Take 0.5 tablets (15 mg total) by mouth daily.   multivitamin with minerals Tabs tablet Take 1 tablet by mouth daily with breakfast.   nitroGLYCERIN 0.4 MG SL tablet Commonly known as: NITROSTAT Place 0.4 mg under the tongue every 5 (five) minutes as needed for chest pain.   omeprazole 40 MG capsule Commonly known as: PRILOSEC Take 1 capsule (40 mg total) by mouth daily. What changed: when to take this   ondansetron 4 MG disintegrating tablet Commonly known as: Zofran ODT Take 1 tablet (4 mg total) by mouth every 8 (eight) hours as needed for nausea or vomiting.   oxyCODONE 5 MG immediate release tablet Commonly known as: Oxy  IR/ROXICODONE Take 1 tablet (5 mg total) by mouth every 4 (four) hours as needed for severe pain.   sorbitol 70 % Soln Take 30 mLs by mouth as needed for moderate constipation.   Systane 0.4-0.3 % Soln Generic drug: Polyethyl Glycol-Propyl Glycol Place 1 drop into both eyes 3 (three) times daily.   TUMS PO Take 1-3 tablets by mouth daily as needed (for gas or indigestion).      Allergies  Allergen Reactions   Lidocaine Shortness Of Breath and Swelling    Mouth swelling    Pantoprazole Other (See Comments)    Made the patient lightheaded and dizzy   Nitrofurantoin Hives and Other (See Comments)    Also made the patient lightheaded and dizzy   Sulfa Antibiotics Hives and Itching   Tape Other (See Comments)    TAPE WILL TEAR THE SKIN!!!    The results of significant diagnostics from this hospitalization (including imaging, microbiology, ancillary and laboratory) are listed below for reference.    Significant Diagnostic Studies: Dg Chest 2 View  Result Date: 10/04/2018 CLINICAL DATA:  SOB,SWELLING IN LEGS,FLUID OVERLOAD,HX CHF,HTN,MI EXAM: CHEST - 2 VIEW COMPARISON:  Prior studies including 09/14/2018 FINDINGS: Median sternotomy and CABG. The heart is enlarged. There is shallow lung inflation. Minimal atelectasis is identified at both lung bases. There are no focal consolidations, pleural effusions, or pulmonary edema. Numerous thoracic compression fractures. IMPRESSION: 1. Cardiomegaly. 2. Bibasilar atelectasis. 3. Lungs otherwise clear. Electronically Signed   By: Nolon Nations M.D.   On: 10/04/2018 12:59   US Renal  Result Date: 10/04/2018 CLINICAL DATA:  Acute renal failure EXAM: RENAL / URINARY TRACT ULTRASOUND COMPLETE COMPARISON:  CT from 09/15/2018 FINDINGS: Right Kidney: Renal measurements: 12.3 x 4.9 x 6.1 cm = volume: 198 mL. Moderate hydronephrosis is noted. There is a 5.6 cm cyst identified similar to that seen on prior CT examination. Left Kidney: Renal  measurements: 11.7 x 5.8 x 5.6 cm = volume: 201 mL. Moderate hydronephrosis is noted similar to that seen on the right. Bladder: Bladder is decompressed IMPRESSION: Bilateral hydronephrosis is noted similar to that seen on prior CT examination. Right renal cyst stable in appearance. Electronically Signed   By: [name] Catalina M.D.   On: 10/04/2018 23:20   US Renal  Result Date: 09/14/2018 CLINICAL DATA:  UTI EXAM: RENAL / URINARY TRACT ULTRASOUND COMPLETE COMPARISON:  12/26/2017 FINDINGS: Right Kidney: Renal measurements: 11.7 x 5.7 x 6.2 cm = volume: 218.4  mL. Slightly echogenic cortex. Renal cortical thinning. Moderate to marked hydronephrosis. Anechoic cyst in the upper pole measuring 6.2 x 5.3 x 4.5 cm. 14 mm suspected stone at the midpole. Left Kidney: Renal measurements: 11.1 x 5.2 x 5.8 cm = volume: 173.8 mL. Slightly echogenic cortex. Moderate hydronephrosis. No mass or shadowing stone. Bladder: Empty due to catheter. IMPRESSION: 1. Bilateral hydronephrosis, moderate severe on the right and moderate on the left. This is new as compared with ultrasound 12/26/2017. 2. Slightly echogenic cortex with cortical thinning consistent with medical renal disease and slight atrophy 3. Anechoic cyst upper pole right kidney. Probable 14 mm stone in the mid right kidney. Electronically Signed   By: Donavan Foil M.D.   On: 09/14/2018 18:33   Dg Chest Port 1 View  Result Date: 09/14/2018 CLINICAL DATA:  Chest pain and abnormal labs in [facility]. EXAM: PORTABLE CHEST 1 VIEW COMPARISON:  Chest x-rays dated 08/28/2018, 07/01/2018 and 02/10/2009. FINDINGS: Heart size and mediastinal contours are stable. Median sternotomy wires in place, for presumed CABG. Mild bibasilar scarring/atelectasis. Lungs otherwise clear. No pleural effusion or pneumothorax seen. No acute or suspicious osseous finding. Bilateral carotid atherosclerosis. IMPRESSION: 1. No active disease. No evidence of pneumonia or pulmonary edema.  2. Carotid atherosclerosis. Electronically Signed   By: Franki Cabot M.D.   On: 09/14/2018 10:39   Ct Renal Stone Study  Result Date: 09/15/2018 CLINICAL DATA:  Chronic kidney disease with prostate cancer and suprapubic catheter. Worsening creatinine. Weakness and increased shortness of breath. EXAM: CT ABDOMEN AND PELVIS WITHOUT CONTRAST TECHNIQUE: Multidetector CT imaging of the abdomen and pelvis was performed following the standard protocol without IV contrast. COMPARISON:  02/19/2017 FINDINGS: Lower chest: Sternotomy wires are present. Mild cardiomegaly. Minimal scarring over the lung bases. Hepatobiliary: Prior cholecystectomy. No focal liver mass. Biliary tree is normal. Pancreas: Stable low-density focus over the head of the pancreas not well visualized on this noncontrast study. Spleen: Normal. Adrenals/Urinary Tract: Adrenal glands are normal. Kidneys are normal in size with stable large right renal cyst. There is new moderate bilateral hydroureteronephrosis. Suprapubic catheter again noted over the bladder which is decompressed. Mild increased density material within the decompressed bladder which may represent hemorrhagic debris. No definite focal mass. Stomach/Bowel: Stomach and small bowel are normal. Appendix is normal. There is diverticulosis of the colon most prominent over the sigmoid colon. No evidence of acute diverticulitis. Vascular/Lymphatic: Moderate calcified plaque over the abdominal aorta. No adenopathy. Reproductive: Multiple radiation seed implants over the prostate unchanged. Other: No free fluid or focal inflammatory change. Musculoskeletal: Moderate degenerative changes of the spine and mild degenerate change of the hips. IMPRESSION: Moderate symmetric bilateral hydroureteronephrosis which is new since the previous exam. Suprapubic catheter is in adequate position within a decompressed bladder. Mild increased density material within the bladder which may represent hemorrhagic  debris. Moderate diverticulosis of the colon without active inflammation. Moderate size right renal cyst unchanged. Stable 1.5 cm fluid attenuation focus over the pancreatic head not well evaluated on this noncontrast study. Recommend follow-up CT 1 year. Aortic Atherosclerosis (ICD10-I70.0). Electronically Signed   By: Marin Olp M.D.   On: 09/15/2018 14:59    Microbiology: Recent Results (from the past 240 hour(s))  SARS Coronavirus 2 (CEPHEID - Performed in [facility] hospital lab), Hosp Order     Status: None   Collection Time: 10/04/18  3:50 PM   Specimen: Nasopharyngeal Swab  Result Value Ref Range Status   SARS Coronavirus 2 NEGATIVE NEGATIVE Final  Comment: (NOTE) If result is NEGATIVE SARS-CoV-2 target nucleic acids are NOT DETECTED. The SARS-CoV-2 RNA is generally detectable in upper and lower  respiratory specimens during the acute phase of infection. The lowest  concentration of SARS-CoV-2 viral copies this assay can detect is 250  copies / mL. A negative result does not preclude SARS-CoV-2 infection  and should not be used as the sole basis for treatment or other  patient management decisions.  A negative result may occur with  improper specimen collection / handling, submission of specimen other  than nasopharyngeal swab, presence of viral mutation(s) within the  areas targeted by this assay, and inadequate number of viral copies  (<250 copies / mL). A negative result must be combined with clinical  observations, patient history, and epidemiological information. If result is POSITIVE SARS-CoV-2 target nucleic acids are DETECTED. The SARS-CoV-2 RNA is generally detectable in upper and lower  respiratory specimens dur ing the acute phase of infection.  Positive  results are indicative of active infection with SARS-CoV-2.  Clinical  correlation with patient history and other diagnostic information is  necessary to determine patient infection status.  Positive results  do  not rule out bacterial infection or co-infection with other viruses. If result is PRESUMPTIVE POSTIVE SARS-CoV-2 nucleic acids MAY BE PRESENT.   A presumptive positive result was obtained on the submitted specimen  and confirmed on repeat testing.  While 2019 novel coronavirus  (SARS-CoV-2) nucleic acids may be present in the submitted sample  additional confirmatory testing may be necessary for epidemiological  and / or clinical management purposes  to differentiate between  SARS-CoV-2 and other Sarbecovirus currently known to infect humans.  If clinically indicated additional testing with an alternate test  methodology [id number]) is advised. The SARS-CoV-2 RNA is generally  detectable in upper and lower respiratory sp ecimens during the acute  phase of infection. The expected result is Negative. Fact Sheet for Patients:  StrictlyIdeas.no Fact Sheet for Healthcare Providers: BankingDealers.co.za This test is not yet approved or cleared by the [place] FDA and has been authorized for detection and/or diagnosis of SARS-CoV-2 by FDA under an Emergency Use Authorization (EUA).  This EUA will remain in effect (meaning this test can be used) for the duration of the COVID-19 declaration under Section 564(b)(1) of the Act, 21 U.S.C. section 360bbb-3(b)(1), unless the authorization is terminated or revoked sooner. Performed at [facility], [place] [address], [place], [place] [address]      Labs: Basic Metabolic Panel: Recent Labs  Lab 10/04/18 1147 10/04/18 1252 10/05/18 0415 10/06/18 0400 10/07/18 0411  NA 139 135 141 143 142  K 4.4 4.4 4.0 4.6 4.3  CL 106 103 107 110 109  CO2 20* 20* 22 22 22   GLUCOSE 120* 117* 124* 118* 127*  BUN 60* 60* 64* 67* 66*  CREATININE 4.80* 4.79* 5.04* 5.29* 5.60*  CALCIUM 8.8* 8.7* 8.9 9.2 9.1   Liver Function Tests: Recent Labs  Lab 10/04/18 1252  AST 27  ALT 22  ALKPHOS 64    BILITOT 0.6  PROT 5.8*  ALBUMIN 3.4*   No results for input(s): LIPASE, AMYLASE in the last 168 hours. No results for input(s): AMMONIA in the last 168 hours. CBC: Recent Labs  Lab 10/04/18 1147 10/05/18 0415 10/06/18 0400 10/07/18 0411  WBC 6.2 6.2 6.7 7.1  NEUTROABS  --   --  4.7 5.2  HGB 8.1* 7.9* 8.3* 8.2*  HCT 25.3* 23.9* 25.3* 25.1*  MCV 98.4 94.5 95.5  96.5  PLT 115* 113* 122* 126*   Cardiac Enzymes: No results for input(s): CKTOTAL, CKMB, CKMBINDEX, TROPONINI in the last 168 hours. BNP: BNP (last 3 results) Recent Labs    12/26/17 0349 08/28/18 1536 10/04/18 1252  BNP 280.6* 441.0* 545.1*    ProBNP (last 3 results) Recent Labs    09/13/18 1503  PROBNP 10,359*    CBG: No results for input(s): GLUCAP in the last 168 hours.  Principal Problem:   Cardiorenal syndrome Active Problems:   HTN (hypertension)   History of prostate cancer   Chronic combined systolic (congestive) and diastolic (congestive) heart failure (HCC)   PAF (paroxysmal atrial fibrillation) (HCC)   Acute on chronic renal failure (HCC)   Anemia due to chronic kidney disease   Compression fracture of thoracic spine, non-traumatic, initial encounter (HCC)   Goals of care, counseling/discussion   Chronic bilateral thoracic back pain   Shortness of breath   Palliative care by specialist   Bladder spasms   Time coordinating discharge: 38 minutes.  Signed:        Jese Comella, DO [facility]  10/07/2018, 4:31 PM

## 2018-10-07 NOTE — Progress Notes (Signed)
  Palliative medicine progress note  Patient seen, chart reviewed.  Dr. Hollie Salk with nephrology in to assess patient during my visit.  Prior to visiting Ronald [name], I also staffed with attending, Dr. Benny Lennert.  Patient's creatinine unfortunately continues to climb.  It is 5.6 today.  He is complaining of bladder spasms.  He states he has medicine at home which helps.  Did phone his wife and she indicated that he was on flavoxate [name]).   Also called hospice and palliative care of [facility] liaison for home hospice admission.  They will be reaching out to Ronald [name] to arrange admission at the home..  Additionally, patient has been complaining of a sore throat.  Upon examination, I did not see any white patches or indication of ulcerations but he states the pain is further down in his esophagus;?  Candidiasis  Patient Profile: 85 y.o. male  with past medical history of ischemic cardiomyopathy with an ejection fraction of 40%, coronary artery disease status post CABG, atrial fib, chronic kidney disease stage III, prostate cancer with suprapubic catheter, recurrent urinary tract infections, chronic back pain admitted on 10/04/2018 with worsening shortness of breath, edema as well as pain.  Upon presentation to the emergency room, patient's creatinine was elevated from his baseline at 4.8 (baseline creatinine 3.0).  Chest x-ray revealed multiple compression fractures.  Renal ultrasound was performed and showed bilateral hydronephrosis  Consult ordered for goals of care and end-of-life care assistance.  Patient wants to continue to treat the treatable such as oral antibiotics for recurrent urinary tract infections.  He would like to continue to have urology help manage his suprapubic catheter.  Otherwise, plan is to return home with hospice support  Plan Patient is on flavoxate at home PRN.  Will order flavoxate 100 mg 3 times daily as needed first dose now for current bladder spasms.   Recommended that he premedicate himself with flavoxate prior to flushing of his suprapubic catheter  Per Dr. Hollie Salk, she is calling urology to see patient suprapubic catheter prior to discharge today  When medically maximized, plan is to return home with support of hospice.  Offered choice of agency per Medicare guidelines: Family elects hospice and palliative care of [place]  Prognosis  < 6 months in the setting of worsening combined congestive heart failure, ischemic cardiomyopathy with EF of 40%, atrial fib, worsening chronic kidney disease with creatinine now 5.6.  Patient had prostate cancer with a history of high-dose radiation, now with bilateral hydronephrosis, likely secondary to late effects of radiation; multiple urinary tract infections requiring hospitalization.  High risk for urosepsis  Disposition  Home with hospice  Thank you, [name], NP Total time: 25 minutes Greater than 50% of time was spent in counseling and coordination of care

## 2018-10-07 NOTE — TOC Transition Note (Signed)
Transition of Care [facility]) - CM/SW Discharge Note   Patient Details  Name: Ronald [name] MRN: [id number] Date of Birth: 1927-11-07  Transition of Care [facility]) CM/SW Contact:  Bartholomew Crews, RN Phone Number: [phone] 10/07/2018, 4:11 PM   Clinical Narrative:    Spoke with son - Ronald [name], Ronald [name]. He asked about bed dimensions. Call made to [facility], but information not readily available.   Son will provide transportation home. Arranged pick up time for 5 pm. Notified covering nurse of pick up time. No other transition of care needs identified.    Final next level of care: Home w Hospice Care Barriers to Discharge: No Barriers Identified   Patient Goals and CMS Choice Patient states their goals for this hospitalization and ongoing recovery are:: to go home CMS Medicare.gov Compare Post Acute Care list provided to:: Patient Choice offered to / list presented to : Patient  Discharge Placement                       Discharge Plan and Services                DME Arranged: Hospital bed, Oxygen DME Agency: [facility]       HH Arranged: RN [facility] Agency: [facility]) Date [place]: 10/07/18 Time [place]: 1611 Representative spoke with at [place]: [place] ([place]) Interventions     Readmission Risk Interventions Readmission Risk Prevention Plan 09/20/2018  Transportation Screening Complete  Medication Review Press photographer) Complete  PCP or Specialist appointment within 3-5 days of discharge Complete  HRI or [place] Complete  SW Recovery Care/Counseling Consult Complete  [place] Not Applicable  Some recent data might be hidden

## 2018-10-07 NOTE — Progress Notes (Addendum)
[facility]) Hospital Liaison:  RN  Liaison notified that the pt's spouse (Shirley)has declined deliver of the DME indicating everything was overwhelming. Liaison requested to contact the son and provided a contact number to explained hospice services.   Liaison spoke with the son Shanon Brow and explained services for home with hospice and the ordering of the DME recommended by the provider that the pt's spouse [name]) agreed to earlier this morning on the initial referral. After much discussed the son Shanon Brow) agrees to services and the DME however states the bed may not fit in the home without him knowing the diameters and measurements of the equipment. Therefore liaison was to only order the home O2. Son requested liaison call the spouse once again and explained hospice services.Liaison contacted the spouse [name]) and reiterated on the conversation from this morning about hospice services and the purpose for ordering the requested DME. Spouse agreed to just order the home O2 and she will obtain measurements from the [employer] care team on the bed for a later delivery. All parties are are that the pt will be discharged today however spouse indicates she has not received a call concerning pt's discharged although the son has spoken with the provider concerning the pt's condition.  No other request or questions at this time.  Liaison contacted [facility] and requested once again for the deliver of just the home O2 with a planned discharged of pt for today. Spoke with [place]. Also contacted [place] with update on the above information and the deliver for the Home O2 only as the request of the son and spouse. Informed the RNCM that the pt's spouse has yet to be notified of the pt's discharge status for today.   Please contact Hospice with any other concerns or inquires   Addendum: Pt is hospice eligible for home with hospice services.   Raina Mina, RN BSN [facility]  Liaison [phone]  [place] are listed daily on AMION under Hospice and Palliative care of [place].

## 2018-10-07 NOTE — Progress Notes (Signed)
Occupational Therapy Evaluation (late entry)  Pt presents to OT with the below listed diagnosis and the below listed deficits.  He demonstrates decreased activity tolerance and mild balance deficits.  He requires min guard assist for ADLs.  He reports he lives with wife, who can assist him at discharge, and reports he was mod I with ADLs PTA.  He will benefit from acute OT to maximize his safety and independence with ADLs in prep for home discharge,  But no follow up OT nor DME recommended.      10/06/18 1700  OT Visit Information  Last OT Received On 10/07/18  Assistance Needed +1  History of Present Illness Patient is a 85 y/o male who presents with SOB and swelling. Recently admitted 6/19-6/25 for AKI and UTI. CXR-thoracic compression fxs. PMh includes chronic cystitis and urolithiasis, stage II CKD, CAD/CABG, chronic systolic CHF, HTN, HLD, mild dementia, A-fib, UTIs, sepsis.  Precautions  Precautions Fall  Home Living  Family/patient expects to be discharged to: Private residence  Living Arrangements Spouse/significant other  Available Help at Discharge Family;Available 24 hours/day  Type of Home House  Home Access Stairs to enter  Entrance Stairs-Number of Steps 3  Entrance Stairs-Rails Left  Home Layout One level  Bathroom Shower/Tub Tub/shower unit  Bathroom Toilet Handicapped height  Bathroom Accessibility No  [place] - 2 wheels;Cane - single point  Additional Comments Pt reports wife is able to assist him as needed   Prior Function  Level of Independence Independent with assistive device(s)  Comments Furniture walker at baseline; uses SPC for community distances,  Communication  Communication HOH  Pain Assessment  Pain Assessment No/denies pain  Cognition  Arousal/Alertness Awake/alert  Behavior During Therapy WFL for tasks assessed/performed  Overall Cognitive Status Within Functional Limits for tasks assessed  General Comments Pt very pleasant   Upper  Extremity Assessment  Upper Extremity Assessment Generalized weakness  Lower Extremity Assessment  Lower Extremity Assessment Defer to PT evaluation  Cervical / Trunk Assessment  Cervical / Trunk Assessment Kyphotic  ADL  Overall ADL's  Needs assistance/impaired  Eating/Feeding Independent  Grooming Wash/dry hands;Wash/dry face;Oral care;Brushing hair;Min guard;Standing  Upper Body Bathing Set up;Sitting  Lower Body Bathing Min guard;Sit to/from stand  Upper Body Dressing  Set up;Sitting  Lower Body Dressing Min guard;Sit to/from Environmental education officer guard;Ambulation;Comfort height toilet;Regular Toilet;Grab bars;RW  Writer and Hygiene Min guard;Sit to/from stand  Functional mobility during ADLs Min guard;Rolling walker  General ADL Comments min guard for balance   Vision- History  Baseline Vision/History No visual deficits  Patient Visual Report No change from baseline  Bed Mobility  Overal bed mobility Modified Independent  General bed mobility comments requires increased time and effort   Transfers  Overall transfer level Needs assistance  Equipment used Rolling walker (2 wheeled)  Transfers Sit to/from Stand;Stand Pivot Transfers  Sit to Stand Supervision  Stand pivot transfers Supervision  General transfer comment Supervision for safety.  Balance  Overall balance assessment Needs assistance;History of Falls  Sitting-balance support Feet supported;No upper extremity supported  Sitting balance-Leahy Scale Good  Standing balance support During functional activity  Standing balance-Leahy Scale Fair  Standing balance comment able to maintain static standing with min guard assist   OT - End of Session  Equipment Utilized During Treatment Rolling walker  Activity Tolerance Patient tolerated treatment well  Patient left in bed;with call bell/phone within reach;with bed alarm set  Nurse Communication Mobility status  OT Assessment  OT  Recommendation/Assessment Patient needs continued OT Services  OT Visit Diagnosis Unsteadiness on feet (R26.81)  OT Problem List Decreased strength;Decreased activity tolerance;Impaired balance (sitting and/or standing)  OT Plan  OT Frequency (ACUTE ONLY) Min 2X/week  OT Treatment/Interventions (ACUTE ONLY) Self-care/ADL training;DME and/or AE instruction;Therapeutic activities;Patient/family education;Balance training  AM-PAC OT "6 Clicks" Daily Activity Outcome Measure (Version 2)  Help from another person eating meals? 4  Help from another person taking care of personal grooming? 3  Help from another person toileting, which includes using toliet, bedpan, or urinal? 3  Help from another person bathing (including washing, rinsing, drying)? 3  Help from another person to put on and taking off regular upper body clothing? 4  Help from another person to put on and taking off regular lower body clothing? 4  6 Click Score 21  OT Recommendation  Follow Up Recommendations No OT follow up;Supervision/Assistance - 24 hour;Supervision - Intermittent  OT Equipment None recommended by OT  Individuals Consulted  Consulted and Agree with Results and Recommendations Patient  Acute Rehab OT Goals  Patient Stated Goal To get home hopefully today   OT Goal Formulation With patient  Time For Goal Achievement 10/21/18  Potential to Achieve Goals Good  OT Time Calculation  OT Start Time (ACUTE ONLY) 1422  OT Stop Time (ACUTE ONLY) 1452  OT Time Calculation (min) 30 min  Written Expression  Dominant Hand Right  Lucille Passy, OTR/L [place] Pager [phone] Office [phone]

## 2018-10-07 NOTE — Progress Notes (Signed)
[facility]) Hospital Liaison:  RN  Notified by [name], NP/Wendi Selinda Michaels, [facility] also aware  with [facility] team of patient/family request for [facility] services at home after discharge. Chart and patient information under review by [facility] physician. Hospice eligibility pending at this time.  Writer spoke with spouse [name]) to initiate education related to hospice philosophy, services and team approach to care. Spouse verbalized understanding of information given. Per discussion, plan is for discharge  private vehicle on today 10/07/2018.   Please send signed and completed out of facility DNR form home with patient/family.     Patient will need prescriptions for discharge comfort medications.   DME needs discussed, patient already has the following equipment: Cane, rolling walker and shower chair w/ back. Spouse requests the following equipment for delivery to the home hospital bed with 1/2 rales and home O2 concentrator. [place] equipment specialist has been notified and will contact [employer] to arrange delivery to the home. Home address has been verified and is correct in the chart. Spouse [name] [phone] is the family contact to arrange time of equipment delivery.   [facility] aware of the above. Please fax completed discharge summary to [facility] at [phone] when final. Please notify [place] when patient is ready to leave the unit at discharge. Please call [phone] between 8:30 and 5pm. Call [phone] after 5pm. [facility] information and contact numbers given to [place] Staff during the telephone call today. Above information shared with Haywood Pao, RNCM with [facility] Team.   Please call with hospice related questions.   Thank you for this referral.  _______________________   Raina Mina, RN, BSN [place]   [place] are listed daily on AMION under Hospice and [place].

## 2018-10-07 NOTE — Progress Notes (Signed)
[facility] Progress Note   Assessment/ Plan:   # AKI on CKD Stage III - baseline Cr 1.3-1.5 prior to 08/2018 - Recent recurrent AKI with hx obstructive uropathy.  His AKI had been resolving with SPC exchange.  Had also been hydrated with lasix suspended that admission and then later resumed on PRN basis and recently transitioned low dose scheduled lasix.  - May represent CKD progression in light of recent AKI events  - Renal US with persistent bilateral hydro; suprapubic catheter is in place and appears to be draining-- 2.8L overnight  - The patient has indicated that he would not want dialysis--> he confirms this to me and to pall care on this admission - not a lot to add right now.  We will sign off.  Please let us know if we can be of further assistance.  Will arrange f/u if necessary with Dr. Royce Macadamia  # Obstructive uropathy - Chronic suprapubic catheter is in place and draining  - discussed with primary nephrologist (Dr. Royce Macadamia).  Per her discussions with urology, other option is to place bilateral PCNs.   - he is agreeable to try to flush suprapubic catheter--> caused bladder spasms  - had SPC changed in late June, urology eval today to see if it can be optimized.  Appreciate care.    # Acute on chronic systolic CHF  - Lasix 60 mg IV once, robust diuresis noted.   - will hold off on further dosing for now   # Abdominal pain - Renal ultrasound as above to r/o any contributing component--> negative  # Compression fractures - Numerous compression fractures incidentally noted on CXR  - Primary team is initiating pain control regimen - appreciate their assistance  - ? kyphoplasty--> not in any pain now  # Anemia  - Secondary in part to AKI/CKD  - Aranesp given - Outpt iron checked at 57 and 22% sat - Feraheme x 1   # Dispo: greatly appreciate palliative care c/s.  I think if there aren't any fixable causes here of progression of CKD/ AKI on CKD hospice is  appropriate, they are planning for this.    Subjective:    Cr still rising.  No uremic symptoms.  Reports bladder spasms.     Objective:   BP (!) 131/56 (BP Location: Left Arm)   Pulse 72   Temp 98 F (36.7 C)   Resp 18   Ht 5\' 5"  (1.651 m)   Wt 65.1 kg   SpO2 99%   BMI 23.88 kg/m   Physical Exam: Gen: elderly gentleman, NAD, sitting in chair CVS: RRR no m/r/g Resp: clear bilaterally GU: + suprapubic cath Ext: trace pedal edema  Labs: BMET Recent Labs  Lab 10/04/18 1147 10/04/18 1252 10/05/18 0415 10/06/18 0400 10/07/18 0411  NA 139 135 141 143 142  K 4.4 4.4 4.0 4.6 4.3  CL 106 103 107 110 109  CO2 20* 20* 22 22 22   GLUCOSE 120* 117* 124* 118* 127*  BUN 60* 60* 64* 67* 66*  CREATININE 4.80* 4.79* 5.04* 5.29* 5.60*  CALCIUM 8.8* 8.7* 8.9 9.2 9.1   CBC Recent Labs  Lab 10/04/18 1147 10/05/18 0415 10/06/18 0400 10/07/18 0411  WBC 6.2 6.2 6.7 7.1  NEUTROABS  --   --  4.7 5.2  HGB 8.1* 7.9* 8.3* 8.2*  HCT 25.3* 23.9* 25.3* 25.1*  MCV 98.4 94.5 95.5 96.5  PLT 115* 113* 122* 126*    @IMGRELPRIORS @ Medications:    . apixaban  2.5 mg Oral BID  . atorvastatin  40 mg Oral QPM  . isosorbide mononitrate  15 mg Oral Daily  . pantoprazole  40 mg Oral Daily  . sodium chloride flush  3 mL Intravenous Once     Madelon Lips, MD [facility] pgr [phone] 10/07/2018, 11:17 AM

## 2018-10-07 NOTE — TOC Progression Note (Signed)
Transition of Care [facility]) - Progression Note    Patient Details  Name: Ronald [name] MRN: [id number] Date of Birth: 11-12-1927  Transition of Care [facility]) CM/SW Contact  Bartholomew Crews, RN Phone Number: [phone] 10/07/2018, 3:04 PM  Clinical Narrative:    Spoke with bedside RN about plans to transition home. Spoke with son. Son has now spoken with MD about readiness for discharge and reason for hospice request. Son with lots of questions about hospice services. Requested hospice liaison - Lattie Haw to call son and discuss services. CM to follow for transition of care needs.         Expected Discharge Plan and Services           Expected Discharge Date: 10/07/18                                     Social Determinants of Health (SDOH) Interventions    Readmission Risk Interventions Readmission Risk Prevention Plan 09/20/2018  Transportation Screening Complete  Medication Review Press photographer) Complete  PCP or Specialist appointment within 3-5 days of discharge Complete  HRI or [place] Complete  SW Recovery Care/Counseling Consult Complete  [place] Not Applicable  Some recent data might be hidden

## 2018-10-07 NOTE — Progress Notes (Addendum)
2 RN's at bedside attempted suprapubic cath exchange and were unsuccessful. When attempting to remove old catheter, a great deal of resistance was met. 10 cc of fluid was removed from balloon prior to attempting to remove. We attempted to remove catheter twice and both attempts were unsuccessful. When Pulling on catheter, a small amount of tan drainage was noted to be leaking around stoma site. We notified nurse and patient was left if bed with call bell within reach.

## 2018-10-08 ENCOUNTER — Telehealth: Payer: Self-pay | Admitting: Internal Medicine

## 2018-10-08 DIAGNOSIS — M1990 Unspecified osteoarthritis, unspecified site: Secondary | ICD-10-CM | POA: Diagnosis not present

## 2018-10-08 DIAGNOSIS — Z87440 Personal history of urinary (tract) infections: Secondary | ICD-10-CM | POA: Diagnosis not present

## 2018-10-08 DIAGNOSIS — I255 Ischemic cardiomyopathy: Secondary | ICD-10-CM | POA: Diagnosis not present

## 2018-10-08 DIAGNOSIS — K219 Gastro-esophageal reflux disease without esophagitis: Secondary | ICD-10-CM | POA: Diagnosis not present

## 2018-10-08 DIAGNOSIS — I480 Paroxysmal atrial fibrillation: Secondary | ICD-10-CM | POA: Diagnosis not present

## 2018-10-08 DIAGNOSIS — M4850XD Collapsed vertebra, not elsewhere classified, site unspecified, subsequent encounter for fracture with routine healing: Secondary | ICD-10-CM | POA: Diagnosis not present

## 2018-10-08 DIAGNOSIS — M549 Dorsalgia, unspecified: Secondary | ICD-10-CM | POA: Diagnosis not present

## 2018-10-08 DIAGNOSIS — D631 Anemia in chronic kidney disease: Secondary | ICD-10-CM | POA: Diagnosis not present

## 2018-10-08 DIAGNOSIS — Z741 Need for assistance with personal care: Secondary | ICD-10-CM | POA: Diagnosis not present

## 2018-10-08 DIAGNOSIS — C61 Malignant neoplasm of prostate: Secondary | ICD-10-CM | POA: Diagnosis not present

## 2018-10-08 DIAGNOSIS — I252 Old myocardial infarction: Secondary | ICD-10-CM | POA: Diagnosis not present

## 2018-10-08 DIAGNOSIS — I25709 Atherosclerosis of coronary artery bypass graft(s), unspecified, with unspecified angina pectoris: Secondary | ICD-10-CM | POA: Diagnosis not present

## 2018-10-08 DIAGNOSIS — Z6823 Body mass index (BMI) 23.0-23.9, adult: Secondary | ICD-10-CM | POA: Diagnosis not present

## 2018-10-08 DIAGNOSIS — I132 Hypertensive heart and chronic kidney disease with heart failure and with stage 5 chronic kidney disease, or end stage renal disease: Secondary | ICD-10-CM | POA: Diagnosis not present

## 2018-10-08 DIAGNOSIS — I5042 Chronic combined systolic (congestive) and diastolic (congestive) heart failure: Secondary | ICD-10-CM | POA: Diagnosis not present

## 2018-10-08 DIAGNOSIS — Z960 Presence of urogenital implants: Secondary | ICD-10-CM | POA: Diagnosis not present

## 2018-10-08 DIAGNOSIS — N1330 Unspecified hydronephrosis: Secondary | ICD-10-CM | POA: Diagnosis not present

## 2018-10-08 DIAGNOSIS — F0150 Vascular dementia without behavioral disturbance: Secondary | ICD-10-CM | POA: Diagnosis not present

## 2018-10-08 DIAGNOSIS — N186 End stage renal disease: Secondary | ICD-10-CM | POA: Diagnosis not present

## 2018-10-08 NOTE — Telephone Encounter (Signed)
LMTCB for Ronald [name] is not in office on Mondays. This message will be routed to her CMA. Please advise.

## 2018-10-08 NOTE — Telephone Encounter (Signed)
Copied from [place] [phone]. Topic: Quick Communication - See Telephone Encounter >> Oct 08, 2018  8:40 AM Nils Flack wrote: CRM for notification. See Telephone encounter for: 10/08/18. Delsa Sale from [place] care called - they need call back asap to make sure Dr Jerilee Hoh will be the physician on record.  They will be seeing pt at 10 am this morning and need to have that info right away.  The pt discharged over the weekend, and they were not expecting him to be released until today.  Please call Delsa Sale at [phone]

## 2018-10-09 ENCOUNTER — Telehealth: Payer: Self-pay | Admitting: *Deleted

## 2018-10-09 DIAGNOSIS — I132 Hypertensive heart and chronic kidney disease with heart failure and with stage 5 chronic kidney disease, or end stage renal disease: Secondary | ICD-10-CM | POA: Diagnosis not present

## 2018-10-09 DIAGNOSIS — I25709 Atherosclerosis of coronary artery bypass graft(s), unspecified, with unspecified angina pectoris: Secondary | ICD-10-CM | POA: Diagnosis not present

## 2018-10-09 DIAGNOSIS — D631 Anemia in chronic kidney disease: Secondary | ICD-10-CM | POA: Diagnosis not present

## 2018-10-09 DIAGNOSIS — F0150 Vascular dementia without behavioral disturbance: Secondary | ICD-10-CM | POA: Diagnosis not present

## 2018-10-09 DIAGNOSIS — I5042 Chronic combined systolic (congestive) and diastolic (congestive) heart failure: Secondary | ICD-10-CM | POA: Diagnosis not present

## 2018-10-09 DIAGNOSIS — N186 End stage renal disease: Secondary | ICD-10-CM | POA: Diagnosis not present

## 2018-10-09 NOTE — Telephone Encounter (Signed)
See phone note

## 2018-10-09 NOTE — Telephone Encounter (Signed)
Yes, I can be attending for hospice.

## 2018-10-09 NOTE — Telephone Encounter (Signed)
Verbal okay given to [facility].

## 2018-10-09 NOTE — Telephone Encounter (Signed)
Copied from [place] [phone]. Topic: General - Other >> Oct 09, 2018 12:32 PM Rainey Pines A wrote: Patients son Kreston Ahrendt. Is requesting a callback from Dr. Jerilee Hoh nurse in regards to patients condition and asked that office not call back between 2-3:30pm because he will be in a meeting.

## 2018-10-11 DIAGNOSIS — F0150 Vascular dementia without behavioral disturbance: Secondary | ICD-10-CM | POA: Diagnosis not present

## 2018-10-11 DIAGNOSIS — I25709 Atherosclerosis of coronary artery bypass graft(s), unspecified, with unspecified angina pectoris: Secondary | ICD-10-CM | POA: Diagnosis not present

## 2018-10-11 DIAGNOSIS — I5042 Chronic combined systolic (congestive) and diastolic (congestive) heart failure: Secondary | ICD-10-CM | POA: Diagnosis not present

## 2018-10-11 DIAGNOSIS — N186 End stage renal disease: Secondary | ICD-10-CM | POA: Diagnosis not present

## 2018-10-11 DIAGNOSIS — I132 Hypertensive heart and chronic kidney disease with heart failure and with stage 5 chronic kidney disease, or end stage renal disease: Secondary | ICD-10-CM | POA: Diagnosis not present

## 2018-10-11 DIAGNOSIS — D631 Anemia in chronic kidney disease: Secondary | ICD-10-CM | POA: Diagnosis not present

## 2018-10-11 NOTE — Telephone Encounter (Signed)
Left message on machine returning son's call CRM

## 2018-10-11 NOTE — Telephone Encounter (Signed)
Pt's son returned vm to [place]. Advised she was at lunch and to send CRM. Please return call. [phone]

## 2018-10-12 NOTE — Telephone Encounter (Signed)
Spoke with son he had 2 questions:  1.  Son wanted to know if Hospice is able to communicate with the patient's providers.  I explained that Hospice does have some access to Epic, but we mainly communicate through phone and fax.  Son was okay with that.  2.  Patient had an appointment with [facility] - Dr Lovena Neighbours.  They were given 3 options for the patient and the son would like Dr Ledell Noss advise as to how to move forward:   A- stent procedure to drain the kidneys.  The stent would have to be replaced every 2-3 months. B - A procedure done through the patient's back that can cause pain. C - Dialysis - hospice has informed the son that the patient may not qualify for home dialysis.

## 2018-10-15 DIAGNOSIS — N186 End stage renal disease: Secondary | ICD-10-CM | POA: Diagnosis not present

## 2018-10-15 DIAGNOSIS — F0150 Vascular dementia without behavioral disturbance: Secondary | ICD-10-CM | POA: Diagnosis not present

## 2018-10-15 DIAGNOSIS — D631 Anemia in chronic kidney disease: Secondary | ICD-10-CM | POA: Diagnosis not present

## 2018-10-15 DIAGNOSIS — I132 Hypertensive heart and chronic kidney disease with heart failure and with stage 5 chronic kidney disease, or end stage renal disease: Secondary | ICD-10-CM | POA: Diagnosis not present

## 2018-10-15 DIAGNOSIS — I25709 Atherosclerosis of coronary artery bypass graft(s), unspecified, with unspecified angina pectoris: Secondary | ICD-10-CM | POA: Diagnosis not present

## 2018-10-15 DIAGNOSIS — I5042 Chronic combined systolic (congestive) and diastolic (congestive) heart failure: Secondary | ICD-10-CM | POA: Diagnosis not present

## 2018-10-16 NOTE — Telephone Encounter (Signed)
Yes, hospice can communicate with [place]. I do not recommend dialysis at all. The kidneys do need to be drained to prevent infection and renal failure. Stent vs suprapubic catheter at urology's discretion.

## 2018-10-16 NOTE — Telephone Encounter (Signed)
Recommendations given to son.  He requested an appointment.  Virtual visit scheduled and son is aware the patient should be present.

## 2018-10-17 ENCOUNTER — Inpatient Hospital Stay (HOSPITAL_COMMUNITY)
Admission: EM | Admit: 2018-10-17 | Discharge: 2018-10-25 | DRG: 698 | Disposition: A | Discharge status: Hospice | Attending: Internal Medicine | Admitting: Internal Medicine

## 2018-10-17 ENCOUNTER — Encounter (HOSPITAL_COMMUNITY): Payer: Self-pay | Admitting: Internal Medicine

## 2018-10-17 ENCOUNTER — Emergency Department (HOSPITAL_COMMUNITY)

## 2018-10-17 ENCOUNTER — Other Ambulatory Visit: Payer: Self-pay

## 2018-10-17 DIAGNOSIS — R531 Weakness: Secondary | ICD-10-CM | POA: Diagnosis not present

## 2018-10-17 DIAGNOSIS — N390 Urinary tract infection, site not specified: Secondary | ICD-10-CM | POA: Diagnosis present

## 2018-10-17 DIAGNOSIS — N189 Chronic kidney disease, unspecified: Secondary | ICD-10-CM | POA: Diagnosis present

## 2018-10-17 DIAGNOSIS — R6520 Severe sepsis without septic shock: Secondary | ICD-10-CM

## 2018-10-17 DIAGNOSIS — I10 Essential (primary) hypertension: Secondary | ICD-10-CM | POA: Diagnosis present

## 2018-10-17 DIAGNOSIS — Z7901 Long term (current) use of anticoagulants: Secondary | ICD-10-CM

## 2018-10-17 DIAGNOSIS — Z87442 Personal history of urinary calculi: Secondary | ICD-10-CM

## 2018-10-17 DIAGNOSIS — A419 Sepsis, unspecified organism: Secondary | ICD-10-CM | POA: Diagnosis not present

## 2018-10-17 DIAGNOSIS — I480 Paroxysmal atrial fibrillation: Secondary | ICD-10-CM | POA: Diagnosis not present

## 2018-10-17 DIAGNOSIS — K219 Gastro-esophageal reflux disease without esophagitis: Secondary | ICD-10-CM | POA: Diagnosis present

## 2018-10-17 DIAGNOSIS — N179 Acute kidney failure, unspecified: Secondary | ICD-10-CM | POA: Diagnosis not present

## 2018-10-17 DIAGNOSIS — N136 Pyonephrosis: Secondary | ICD-10-CM | POA: Diagnosis present

## 2018-10-17 DIAGNOSIS — I130 Hypertensive heart and chronic kidney disease with heart failure and stage 1 through stage 4 chronic kidney disease, or unspecified chronic kidney disease: Secondary | ICD-10-CM | POA: Diagnosis present

## 2018-10-17 DIAGNOSIS — G8929 Other chronic pain: Secondary | ICD-10-CM | POA: Diagnosis present

## 2018-10-17 DIAGNOSIS — E7800 Pure hypercholesterolemia, unspecified: Secondary | ICD-10-CM | POA: Diagnosis not present

## 2018-10-17 DIAGNOSIS — D631 Anemia in chronic kidney disease: Secondary | ICD-10-CM | POA: Diagnosis present

## 2018-10-17 DIAGNOSIS — Z882 Allergy status to sulfonamides status: Secondary | ICD-10-CM

## 2018-10-17 DIAGNOSIS — R0602 Shortness of breath: Secondary | ICD-10-CM | POA: Diagnosis not present

## 2018-10-17 DIAGNOSIS — Z87440 Personal history of urinary (tract) infections: Secondary | ICD-10-CM

## 2018-10-17 DIAGNOSIS — R140 Abdominal distension (gaseous): Secondary | ICD-10-CM | POA: Diagnosis present

## 2018-10-17 DIAGNOSIS — T83510A Infection and inflammatory reaction due to cystostomy catheter, initial encounter: Secondary | ICD-10-CM | POA: Diagnosis not present

## 2018-10-17 DIAGNOSIS — Z20828 Contact with and (suspected) exposure to other viral communicable diseases: Secondary | ICD-10-CM | POA: Diagnosis present

## 2018-10-17 DIAGNOSIS — Y846 Urinary catheterization as the cause of abnormal reaction of the patient, or of later complication, without mention of misadventure at the time of the procedure: Secondary | ICD-10-CM | POA: Diagnosis present

## 2018-10-17 DIAGNOSIS — F0390 Unspecified dementia without behavioral disturbance: Secondary | ICD-10-CM | POA: Diagnosis present

## 2018-10-17 DIAGNOSIS — I132 Hypertensive heart and chronic kidney disease with heart failure and with stage 5 chronic kidney disease, or end stage renal disease: Secondary | ICD-10-CM | POA: Diagnosis not present

## 2018-10-17 DIAGNOSIS — A4102 Sepsis due to Methicillin resistant Staphylococcus aureus: Secondary | ICD-10-CM | POA: Diagnosis present

## 2018-10-17 DIAGNOSIS — N184 Chronic kidney disease, stage 4 (severe): Secondary | ICD-10-CM | POA: Diagnosis present

## 2018-10-17 DIAGNOSIS — Z881 Allergy status to other antibiotic agents status: Secondary | ICD-10-CM

## 2018-10-17 DIAGNOSIS — Z66 Do not resuscitate: Secondary | ICD-10-CM

## 2018-10-17 DIAGNOSIS — R52 Pain, unspecified: Secondary | ICD-10-CM | POA: Diagnosis not present

## 2018-10-17 DIAGNOSIS — N1330 Unspecified hydronephrosis: Secondary | ICD-10-CM | POA: Diagnosis not present

## 2018-10-17 DIAGNOSIS — I25810 Atherosclerosis of coronary artery bypass graft(s) without angina pectoris: Secondary | ICD-10-CM | POA: Diagnosis not present

## 2018-10-17 DIAGNOSIS — Z515 Encounter for palliative care: Secondary | ICD-10-CM | POA: Diagnosis not present

## 2018-10-17 DIAGNOSIS — B9561 Methicillin susceptible Staphylococcus aureus infection as the cause of diseases classified elsewhere: Secondary | ICD-10-CM | POA: Insufficient documentation

## 2018-10-17 DIAGNOSIS — F0150 Vascular dementia without behavioral disturbance: Secondary | ICD-10-CM | POA: Diagnosis not present

## 2018-10-17 DIAGNOSIS — M4854XA Collapsed vertebra, not elsewhere classified, thoracic region, initial encounter for fracture: Secondary | ICD-10-CM | POA: Diagnosis present

## 2018-10-17 DIAGNOSIS — N186 End stage renal disease: Secondary | ICD-10-CM | POA: Diagnosis not present

## 2018-10-17 DIAGNOSIS — Z833 Family history of diabetes mellitus: Secondary | ICD-10-CM

## 2018-10-17 DIAGNOSIS — I2510 Atherosclerotic heart disease of native coronary artery without angina pectoris: Secondary | ICD-10-CM | POA: Diagnosis present

## 2018-10-17 DIAGNOSIS — Z8249 Family history of ischemic heart disease and other diseases of the circulatory system: Secondary | ICD-10-CM

## 2018-10-17 DIAGNOSIS — I959 Hypotension, unspecified: Secondary | ICD-10-CM | POA: Diagnosis not present

## 2018-10-17 DIAGNOSIS — R5381 Other malaise: Secondary | ICD-10-CM | POA: Diagnosis not present

## 2018-10-17 DIAGNOSIS — E785 Hyperlipidemia, unspecified: Secondary | ICD-10-CM | POA: Diagnosis present

## 2018-10-17 DIAGNOSIS — I5042 Chronic combined systolic (congestive) and diastolic (congestive) heart failure: Secondary | ICD-10-CM | POA: Diagnosis present

## 2018-10-17 DIAGNOSIS — R7881 Bacteremia: Secondary | ICD-10-CM | POA: Insufficient documentation

## 2018-10-17 DIAGNOSIS — I252 Old myocardial infarction: Secondary | ICD-10-CM

## 2018-10-17 DIAGNOSIS — Z8546 Personal history of malignant neoplasm of prostate: Secondary | ICD-10-CM | POA: Diagnosis not present

## 2018-10-17 DIAGNOSIS — Z9359 Other cystostomy status: Secondary | ICD-10-CM | POA: Diagnosis not present

## 2018-10-17 DIAGNOSIS — I25709 Atherosclerosis of coronary artery bypass graft(s), unspecified, with unspecified angina pectoris: Secondary | ICD-10-CM | POA: Diagnosis not present

## 2018-10-17 DIAGNOSIS — Z87891 Personal history of nicotine dependence: Secondary | ICD-10-CM

## 2018-10-17 LAB — URINALYSIS, ROUTINE W REFLEX MICROSCOPIC
Bilirubin Urine: NEGATIVE
Glucose, UA: NEGATIVE mg/dL
Ketones, ur: NEGATIVE mg/dL
Nitrite: NEGATIVE
Protein, ur: 100 mg/dL — AB
RBC / HPF: >50 RBC/hpf — ABNORMAL HIGH (ref 0–5)
Specific Gravity, Urine: 1.013 (ref 1.005–1.030)
WBC, UA: >50 WBC/hpf — ABNORMAL HIGH (ref 0–5)
pH: 5 (ref 5.0–8.0)

## 2018-10-17 LAB — CBC
HCT: 26.6 % — ABNORMAL LOW (ref 39.0–52.0)
HCT: 27 % — ABNORMAL LOW (ref 39.0–52.0)
Hemoglobin: 8.6 g/dL — ABNORMAL LOW (ref 13.0–17.0)
Hemoglobin: 9 g/dL — ABNORMAL LOW (ref 13.0–17.0)
MCH: 31.4 pg (ref 26.0–34.0)
MCH: 31.8 pg (ref 26.0–34.0)
MCHC: 32.3 g/dL (ref 30.0–36.0)
MCHC: 33.3 g/dL (ref 30.0–36.0)
MCV: 97.1 fL (ref 80.0–100.0)
MCV: 95.4 fL (ref 80.0–100.0)
Platelets: 133 10*3/uL — ABNORMAL LOW (ref 150–400)
Platelets: 131 10*3/uL — ABNORMAL LOW (ref 150–400)
RBC: 2.74 MIL/uL — ABNORMAL LOW (ref 4.22–5.81)
RBC: 2.83 MIL/uL — ABNORMAL LOW (ref 4.22–5.81)
RDW: 15.6 % — ABNORMAL HIGH (ref 11.5–15.5)
RDW: 15.4 % (ref 11.5–15.5)
WBC: 30 10*3/uL — ABNORMAL HIGH (ref 4.0–10.5)
WBC: 27.1 10*3/uL — ABNORMAL HIGH (ref 4.0–10.5)
nRBC: 0 % (ref 0.0–0.2)
nRBC: 0 % (ref 0.0–0.2)

## 2018-10-17 LAB — POCT I-STAT EG7
Acid-base deficit: 9 mmol/L — ABNORMAL HIGH (ref 0.0–2.0)
Bicarbonate: 16.2 mmol/L — ABNORMAL LOW (ref 20.0–28.0)
Calcium, Ion: 1.11 mmol/L — ABNORMAL LOW (ref 1.15–1.40)
HCT: 25 % — ABNORMAL LOW (ref 39.0–52.0)
Hemoglobin: 8.5 g/dL — ABNORMAL LOW (ref 13.0–17.0)
O2 Saturation: 73 %
Potassium: 4.3 mmol/L (ref 3.5–5.1)
Sodium: 132 mmol/L — ABNORMAL LOW (ref 135–145)
TCO2: 17 mmol/L — ABNORMAL LOW (ref 22–32)
pCO2, Ven: 30.7 mmHg — ABNORMAL LOW (ref 44.0–60.0)
pH, Ven: 7.33 (ref 7.250–7.430)
pO2, Ven: 41 mmHg (ref 32.0–45.0)

## 2018-10-17 LAB — LIPASE, BLOOD: Lipase: 23 U/L (ref 11–51)

## 2018-10-17 LAB — DIFFERENTIAL
Abs Immature Granulocytes: 0 10*3/uL (ref 0.00–0.07)
Basophils Absolute: 0 10*3/uL (ref 0.0–0.1)
Basophils Relative: 0 %
Eosinophils Absolute: 0 10*3/uL (ref 0.0–0.5)
Eosinophils Relative: 0 %
Lymphocytes Relative: 2 %
Lymphs Abs: 0.5 10*3/uL — ABNORMAL LOW (ref 0.7–4.0)
Monocytes Absolute: 0.8 10*3/uL (ref 0.1–1.0)
Monocytes Relative: 3 %
Neutro Abs: 25.7 10*3/uL — ABNORMAL HIGH (ref 1.7–7.7)
Neutrophils Relative %: 95 %
nRBC: 0 /100 WBC

## 2018-10-17 LAB — CBG MONITORING, ED: Glucose-Capillary: 109 mg/dL — ABNORMAL HIGH (ref 70–99)

## 2018-10-17 LAB — BASIC METABOLIC PANEL
Anion gap: 16 — ABNORMAL HIGH (ref 5–15)
BUN: 114 mg/dL — ABNORMAL HIGH (ref 8–23)
CO2: 15 mmol/L — ABNORMAL LOW (ref 22–32)
Calcium: 8.5 mg/dL — ABNORMAL LOW (ref 8.9–10.3)
Chloride: 100 mmol/L (ref 98–111)
Creatinine, Ser: 8.26 mg/dL — ABNORMAL HIGH (ref 0.61–1.24)
GFR calc Af Amer: 6 mL/min — ABNORMAL LOW (ref >60)
GFR calc non Af Amer: 5 mL/min — ABNORMAL LOW (ref >60)
Glucose, Bld: 148 mg/dL — ABNORMAL HIGH (ref 70–99)
Potassium: 4.5 mmol/L (ref 3.5–5.1)
Sodium: 131 mmol/L — ABNORMAL LOW (ref 135–145)

## 2018-10-17 LAB — LACTIC ACID, PLASMA: Lactic Acid, Venous: 1.8 mmol/L (ref 0.5–1.9)

## 2018-10-17 LAB — SARS CORONAVIRUS 2 BY RT PCR (HOSPITAL ORDER, PERFORMED IN ~~LOC~~ HOSPITAL LAB): SARS Coronavirus 2: NEGATIVE

## 2018-10-17 MED ORDER — ONDANSETRON HCL 4 MG PO TABS: 4.0000 mg | ORAL_TABLET | Freq: Four times a day (QID) | ORAL | Status: DC | PRN

## 2018-10-17 MED ORDER — OXYCODONE HCL 5 MG PO TABS
5.0000 mg | ORAL_TABLET | ORAL | Status: DC | PRN
  Administered 2018-10-20 – 2018-10-22 (×3): 5 mg via ORAL
  Filled 2018-10-17 (×3): qty 1

## 2018-10-17 MED ORDER — SODIUM CHLORIDE 0.9% FLUSH: 3.0000 mL | Freq: Once | INTRAVENOUS | Status: DC

## 2018-10-17 MED ORDER — OMEPRAZOLE 20 MG PO CPDR
40.0000 mg | DELAYED_RELEASE_CAPSULE | Freq: Every day | ORAL | Status: DC
  Administered 2018-10-18 – 2018-10-25 (×8): 40 mg via ORAL
  Filled 2018-10-17 (×8): qty 2

## 2018-10-17 MED ORDER — SODIUM CHLORIDE 0.9 % IV SOLN
1.0000 g | Freq: Once | INTRAVENOUS | Status: AC
  Administered 2018-10-17: 20:00:00 1 g via INTRAVENOUS
  Filled 2018-10-17: qty 1

## 2018-10-17 MED ORDER — ACETAMINOPHEN 325 MG PO TABS: 650.0000 mg | ORAL_TABLET | Freq: Four times a day (QID) | ORAL | Status: DC | PRN

## 2018-10-17 MED ORDER — ONDANSETRON HCL 4 MG/2ML IJ SOLN: 4.0000 mg | Freq: Four times a day (QID) | INTRAMUSCULAR | Status: DC | PRN

## 2018-10-17 MED ORDER — ATORVASTATIN CALCIUM 40 MG PO TABS: 40.0000 mg | ORAL_TABLET | Freq: Every evening | ORAL | Status: DC

## 2018-10-17 MED ORDER — HYDROCODONE-ACETAMINOPHEN 5-325 MG PO TABS
1.0000 | ORAL_TABLET | ORAL | Status: DC | PRN
  Administered 2018-10-18 – 2018-10-21 (×3): 1 via ORAL
  Filled 2018-10-17 (×3): qty 1

## 2018-10-17 MED ORDER — SODIUM CHLORIDE 0.9 % IV SOLN
1.0000 g | INTRAVENOUS | Status: DC
  Administered 2018-10-17: 22:00:00 1 g via INTRAVENOUS
  Filled 2018-10-17 (×2): qty 1

## 2018-10-17 MED ORDER — FLAVOXATE HCL 100 MG PO TABS
100.0000 mg | ORAL_TABLET | Freq: Three times a day (TID) | ORAL | Status: DC | PRN
  Filled 2018-10-17 (×2): qty 1

## 2018-10-17 MED ORDER — SODIUM CHLORIDE 0.9 % IV SOLN
INTRAVENOUS | Status: AC
  Administered 2018-10-17: via INTRAVENOUS

## 2018-10-17 MED ORDER — ACETAMINOPHEN 650 MG RE SUPP: 650.0000 mg | Freq: Four times a day (QID) | RECTAL | Status: DC | PRN

## 2018-10-17 MED ORDER — APIXABAN 2.5 MG PO TABS
2.5000 mg | ORAL_TABLET | Freq: Two times a day (BID) | ORAL | Status: DC
  Administered 2018-10-17 – 2018-10-25 (×16): 2.5 mg via ORAL
  Filled 2018-10-17 (×16): qty 1

## 2018-10-17 NOTE — H&P (Signed)
[name] LPF:[id number] DOB: September 07, 1927 DOA: 10/17/2018     PCP: Isaac Bliss, [name], MD   Outpatient Specialists:   CARDS:  Dr. Angelena Form Urology Dr. Gilford Rile  Patient arrived to ER on 10/17/18 at 1528  Patient coming from: home Lives   With family wife   Chief Complaint:  Chief Complaint  Patient presents with   Shortness of Breath    HPI: Ronald [name] is a 85 y.o. male with medical history significant of  CAD s/p CABG in 1989 and redo bypass in 1997, HTN, HLD, mild dementia, prostate cancer and atrial fibrillation, history of kidney stones and urethral stricture  sp suprapubic catheter, GERD  Presented with   Chills,  fever up to 101,  fatigue and generalized body ache Reports decreased urine output from suprapubic catheter Patient recently apparently was diagnosed with UTI treated with Cipro 546m EMS was called given fever to bring him into emergency department  Patient reported he has been feeling so weak he cannot sit and stand up which is not normal for him he has been occasional shortness of breath has been going on for few months also reports some epigastric discomfort.  No cough  Infectious risk factors:  Reports   Fever,  In  ER RAPID COVID TEST NEGATIVE   Regarding pertinent Chronic problems:     Hyperlipidemia -  on statins lipitor    CHF diastolic/systolic/ combined -   Echo October 2017 with LVEF=40-45%, mild MR   Echo November 2018 with LVEF=50-55% Echo 08/29/18 with LVEF=40-45% with hypokinesis of the basal and mid inferoseptal walls On lasix 20 mg qday    CAD  - On  statin, Eliquis aspirin was stopped not tolerating beta-blocker                -  followed by cardiology                -Cardiac cath 08/28/13 with 95% left main stenosis, occluded LAD, occluded Circumflex, occluded RCA. Patent LIMA to LAD, patent SVG to OM supplying collaterals to RCA. Occluded SVG to RCA and SVG to intermediate branch.       A. Fib -  - CHA2DS2 vas score 4  :  current  on anticoagulation with   Eliquis,           -  Rate control:  Does not tolerate betablocker due to bradycardia        CKD stage IV- baseline Cr 5   While in ER:  The following Work up has been ordered so far:  Orders Placed This Encounter  Procedures   SARS Coronavirus 2 (CEPHEID- Performed in [facility] Hosp Order   Blood culture (routine x 2)   Urine culture   DG Chest Portable 1 View   CT ABDOMEN PELVIS WO CONTRAST   Basic metabolic panel   CBC   Urinalysis, Routine w reflex microscopic   Differential   CK   Lactic acid, plasma   CBC   Sodium, urine, random   Osmolality, urine   Creatinine, urine, random   Lipase, blood   Vitamin B12   Folate   Iron and TIBC   Ferritin   Reticulocytes   If O2 Sat <94% administer O2 at 2 liters/minute via nasal cannula   Saline Lock IV, Maintain IV access   Encourage fluids   Cardiac monitoring   Strict intake and output   Full code   Consult to hospitalist  ALL PATIENTS BEING ADMITTED/HAVING PROCEDURES NEED COVID-19 SCREENING   Airborne and Contact precautions   Pulse oximetry, continuous   CBG monitoring, ED   POCT I-Stat EG7   EKG 12-Lead   ED EKG   Type and screen [place] in observation (patient's expected length of stay will be less than 2 midnights)    Following Medications were ordered in ER: Medications  sodium chloride flush (NS) 0.9 % injection 3 mL (has no administration in time range)  aztreonam (AZACTAM) 1 g in sodium chloride 0.9 % 100 mL IVPB (1 g Intravenous New Bag/Given 10/17/18 1936)        Consult Orders  (From admission, onward)         Start     Ordered   10/17/18 1838  Consult to hospitalist  ALL PATIENTS BEING ADMITTED/HAVING PROCEDURES NEED COVID-19 SCREENING  Once    Comments: ALL PATIENTS BEING ADMITTED/HAVING PROCEDURES NEED COVID-19 SCREENING  Provider:  (Not yet assigned)  Question Answer  Comment  Place call to: [facility]   Reason for Consult Admit   Diagnosis/Clinical Info for Consult: UTI failed OP abx      10/17/18 1837            Significant initial  Findings: Abnormal Labs Reviewed  BASIC METABOLIC PANEL - Abnormal; Notable for the following components:      Result Value   Sodium 131 (*)    CO2 15 (*)    Glucose, Bld 148 (*)    BUN 114 (*)    Creatinine, Ser 8.26 (*)    Calcium 8.5 (*)    GFR calc non Af Amer 5 (*)    GFR calc Af Amer 6 (*)    Anion gap 16 (*)    All other components within normal limits  CBC - Abnormal; Notable for the following components:   WBC 30.0 (*)    RBC 2.74 (*)    Hemoglobin 8.6 (*)    HCT 26.6 (*)    RDW 15.6 (*)    Platelets 133 (*)    All other components within normal limits  URINALYSIS, ROUTINE W REFLEX MICROSCOPIC - Abnormal; Notable for the following components:   APPearance TURBID (*)    Hgb urine dipstick LARGE (*)    Protein, ur 100 (*)    Leukocytes,Ua LARGE (*)    RBC / HPF >50 (*)    WBC, UA >50 (*)    Bacteria, UA MANY (*)    Non Squamous Epithelial 6-10 (*)    All other components within normal limits  DIFFERENTIAL - Abnormal; Notable for the following components:   Neutro Abs 25.7 (*)    Lymphs Abs 0.5 (*)    All other components within normal limits  CBC - Abnormal; Notable for the following components:   WBC 27.1 (*)    RBC 2.83 (*)    Hemoglobin 9.0 (*)    HCT 27.0 (*)    Platelets 131 (*)    All other components within normal limits  CBG MONITORING, ED - Abnormal; Notable for the following components:   Glucose-Capillary 109 (*)    All other components within normal limits  POCT I-STAT EG7 - Abnormal; Notable for the following components:   pCO2, Ven 30.7 (*)    Bicarbonate 16.2 (*)    TCO2 17 (*)    Acid-base deficit 9.0 (*)    Sodium 132 (*)    Calcium, Ion 1.11 (*)  HCT 25.0 (*)    Hemoglobin 8.5 (*)    All other components within normal limits    Otherwise labs  showing:    Recent Labs  Lab 10/17/18 1554 10/17/18 1732  NA 131* 132*  K 4.5 4.3  CO2 15*  --   GLUCOSE 148*  --   BUN 114*  --   CREATININE 8.26*  --   CALCIUM 8.5*  --     Cr    Up from baseline see below Lab Results  Component Value Date   CREATININE 8.26 (H) 10/17/2018   CREATININE 5.60 (H) 10/07/2018   CREATININE 5.29 (H) 10/06/2018    No results for input(s): AST, ALT, ALKPHOS, BILITOT, PROT, ALBUMIN in the last 168 hours. Lab Results  Component Value Date   CALCIUM 8.5 (L) 10/17/2018   PHOS 3.3 01/20/2016      WBC      Component Value Date/Time   WBC 27.1 (H) 10/17/2018 1707   ANC    Component Value Date/Time   NEUTROABS 25.7 (H) 10/17/2018 1707   ALC 0.4   Plt: Lab Results  Component Value Date   PLT 131 (L) 10/17/2018     Lactic Acid, Venous    Component Value Date/Time   LATICACIDVEN 1.8 10/17/2018 1729    Procalcitonin ordered   COVID-19 Labs    Lab Results  Component Value Date   SARSCOV2NAA NEGATIVE 10/17/2018   [place] NEGATIVE 10/04/2018   SARSCOV2NAA NOT DETECTED 09/14/2018   [place] NEGATIVE 08/28/2018     Venous  Blood Gas result:  pH 7.333 pCO2 30.7         HG/HCT   Down   from baseline see below    Component Value Date/Time   HGB 8.5 (L) 10/17/2018 1732   HGB 9.6 (L) 09/13/2018 1503   HCT 25.0 (L) 10/17/2018 1732   HCT 28.4 (L) 09/13/2018 1503        BNP (last 3 results) Recent Labs    12/26/17 0349 08/28/18 1536 10/04/18 1252  BNP 280.6* 441.0* 545.1*    ProBNP (last 3 results) Recent Labs    09/13/18 1503  PROBNP 10,359*    DM  labs:  HbA1C: No results for input(s): HGBA1C in the last 8760 hours.     CBG (last 3)  Recent Labs    10/17/18 1704  GLUCAP 109*       UA  evidence of UTI     Urine analysis:    Component Value Date/Time   COLORURINE YELLOW 10/17/2018 1730   APPEARANCEUR TURBID (A) 10/17/2018 1730   LABSPEC 1.013 10/17/2018 1730   PHURINE 5.0 10/17/2018 1730    GLUCOSEU NEGATIVE 10/17/2018 1730   HGBUR LARGE (A) 10/17/2018 1730   BILIRUBINUR NEGATIVE 10/17/2018 1730   BILIRUBINUR neg 06/30/2014 1530   KETONESUR NEGATIVE 10/17/2018 1730   PROTEINUR 100 (A) 10/17/2018 1730   UROBILINOGEN 0.2 12/04/2014 2344   NITRITE NEGATIVE 10/17/2018 1730   LEUKOCYTESUR LARGE (A) 10/17/2018 1730       CXR - atelectasis Left base  CTabd/pelvis - *nonacute    ECG:  Personally reviewed by me showing: HR : 76 Rhythm:   A.fib.     no evidence of ischemic changes QTC 453      ED Triage Vitals  Enc Vitals Group     BP 10/17/18 1532 (!) 94/58     Pulse Rate 10/17/18 1532 74     Resp 10/17/18 1532 18     Temp 10/17/18 1532 (!)  97.5 F (36.4 C)     Temp Source 10/17/18 1532 Oral     SpO2 10/17/18 1532 99 %     Weight --      Height --      Head Circumference --      Peak Flow --      Pain Score 10/17/18 1540 6     Pain Loc --      Pain Edu? --      Excl. in [place]? --   TMAX(24)@       Latest  Blood pressure 106/77, pulse 69, temperature (!) 97.5 F (36.4 C), temperature source Oral, resp. rate 18, SpO2 98 %.    Hospitalist was called for admission for  UTi causing sepsis   Review of Systems:    Pertinent positives include:  Fevers, chills, fatigue,  Constitutional:  No weight loss, night sweats, weight loss  HEENT:  No headaches, Difficulty swallowing,Tooth/dental problems,Sore throat,  No sneezing, itching, ear ache, nasal congestion, post nasal drip,  Cardio-vascular:  No chest pain, Orthopnea, PND, anasarca, dizziness, palpitations.no Bilateral lower extremity swelling  GI:  No heartburn, indigestion, abdominal pain, nausea, vomiting, diarrhea, change in bowel habits, loss of appetite, melena, blood in stool, hematemesis Resp:  no shortness of breath at rest. No dyspnea on exertion, No excess mucus, no productive cough, No non-productive cough, No coughing up of blood.No change in color of mucus.No wheezing. Skin:  no rash or  lesions. No jaundice GU:  no dysuria, change in color of urine, no urgency or frequency. No straining to urinate.  No flank pain.  Musculoskeletal:  No joint pain or no joint swelling. No decreased range of motion. No back pain.  Psych:  No change in mood or affect. No depression or anxiety. No memory loss.  Neuro: no localizing neurological complaints, no tingling, no weakness, no double vision, no gait abnormality, no slurred speech, no confusion  All systems reviewed and apart from [place] all are negative  Past Medical History:   Past Medical History:  Diagnosis Date   Anemia 09/14/2018   Arthritis    "joints ache" (01/02/2014)   At risk for sleep apnea    STOP-BANG= 4    SENT TO PCP 12-23-2013   Bladder calculi    Bradycardia    Chronic cystitis    Chronic systolic CHF (congestive heart failure) ([place])    a. EF improved to 50-55% in 2018, previously 35-40%   CKD (chronic kidney disease), stage II    Coronary artery disease CARDIOLOGIST-  DR MCALHANY   a. CAD s/p CABG in 1989. b. redo bypass in 1997. c. last cath 2015 - med rx.   Diverticulosis    GERD (gastroesophageal reflux disease)    History of Bell's palsy    RIGHT SIDE-- NO RESIDUAL   Hx of dizziness    Hyperlipidemia    Hypertension    "not anymore" (01/02/2014)   Ischemic cardiomyopathy    Kidney stones "years ago"   "passed them"   Melanoma of ear ([place])    "right"   Mild dementia ([place])    Myocardial infarction ([place]) 1986; 1997   Nocturia    OAB (overactive bladder)    Persistent atrial fibrillation    Prostate cancer ([place]) 1998   S/P  RADIACTIVE SEED IMPLANTS; UROLOGIST-  DR GRAPEY   S/P CABG (coronary artery bypass graft)    1989  X6  &  1998 X5   Sepsis due to urinary tract infection ([place])  Urethral stricture    UTI (urinary tract infection) 09/2015   Wears hearing aid    bilateral   Wears partial dentures       Past Surgical History:  Procedure Laterality Date    CARDIAC CATHETERIZATION  02-22-2005  dr Vidal Schwalbe   mild to moderate lv dysfunction with inferobasilar akinesis/  totally occluded SVG to Intermediate Diagonal and SVG to PDA and RCA branches, totally occluded native coronary circulation with diffuse disease pLAD diagonal system with potentially could be ischemic/  patent SVG to OM with collaterals to dRCA and patent LIMA to LAD and diagonal systemss   CARDIAC CATHETERIZATION  08-28-2013  DR Daneen Schick   widely patent sequential left internal graft to the diagonal/LAD, widely patent SVG to OM with proximal 50% narrowing noted in the graft, total occlusion SVG's to RCA, RI, and the Diagonal/ LV dysfunction with inferobasal aneurysm and mid anterior wall region of akinesis/  overall EF 35-40%/  Total occlusion of the navtive circulation/  no significant change compared to 2006 cath   CARDIOVASCULAR STRESS TEST  08-01-2011  dr Angelena Form   inferior scar and possible soft tissue attenuation with minimal peri-infarct ischemia, small region of anterior ischemia and scar/  LVEF 53% LV wall motion with inferior hypokinesis/ no significant change from scan july 2011   CATARACT EXTRACTION W/ INTRAOCULAR LENS  IMPLANT, BILATERAL Bilateral 2007   CORONARY ARTERY BYPASS GRAFT  11/ 1989  &  10/ 1997   1989-- 6 vessel/  1997 Re-do 5 vessel   CYSTOSCOPY WITH URETHRAL DILATATION N/A 12/25/2013   Procedure: CYSTOSCOPY WITH URETHRAL DILATATION, WITH BIOPSY;  Surgeon: Bernestine Amass, MD;  [facility];  Service: Urology;  Laterality: N/A;   CYSTOSCOPY WITH URETHRAL DILATATION N/A 12/22/2014   Procedure: CYSTOSCOPY WITH URETHRAL DILATATION;  Surgeon: Rana Snare, MD;  Location: [facility];  Service: Urology;  Laterality: N/A;  BALLOON DILATION CATHETER     EUS  10/05/2011   Procedure: ESOPHAGEAL ENDOSCOPIC ULTRASOUND (EUS) RADIAL;  Surgeon: Arta Silence, MD;  Location: [facility];  Service: Endoscopy;  Laterality: N/A;   INSERTION OF  SUPRAPUBIC CATHETER N/A 12/22/2014   Procedure: INSERTION OF SUPRAPUBIC CATHETER ;  Surgeon: Rana Snare, MD;  Location: [facility];  Service: Urology;  Laterality: N/A;   LAPAROSCOPIC CHOLECYSTECTOMY  12-23-2005   LEFT HEART CATHETERIZATION WITH CORONARY/GRAFT ANGIOGRAM N/A 08/28/2013   Procedure: LEFT HEART CATHETERIZATION WITH Beatrix Fetters;  Surgeon: Sinclair Grooms, MD;  Location: [facility] LAB;  Service: Cardiovascular;  Laterality: N/A;   MELANOMA EXCISION  X 1   "ear"   RADIOACTIVE PROSTATE SEED IMPLANTS  1998   SKIN CANCER EXCISION  X 2   "top of head"   TONSILLECTOMY AND ADENOIDECTOMY  1954    Social History:  Ambulatory walker     reports that he quit smoking about 34 years ago. His smoking use included cigars. He quit after 40.00 years of use. He quit smokeless tobacco use about 5 years ago.  His smokeless tobacco use included chew. He reports that he does not drink alcohol or use drugs.     Family History:   Family History  Problem Relation Age of Onset   Heart disease Mother    Diabetes Father    Heart disease Brother     Allergies: Allergies  Allergen Reactions   Lidocaine Shortness Of Breath and Swelling    Mouth swelling    Pantoprazole Other (See Comments)    Made the patient lightheaded  and dizzy   Nitrofurantoin Hives and Other (See Comments)    Also made the patient lightheaded and dizzy   Sulfa Antibiotics Hives and Itching   Tape Other (See Comments)    TAPE WILL TEAR THE SKIN!!!     Prior to Admission medications   Medication Sig Start Date End Date Taking? Authorizing Provider  acetaminophen (TYLENOL) 500 MG tablet Take 500 mg by mouth every 6 (six) hours as needed for mild pain, fever or headache.    Yes [provider]  apixaban (ELIQUIS) 5 MG TABS tablet Take 2.5 mg by mouth 2 (two) times daily.   Yes [provider]  atorvastatin (LIPITOR) 80 MG tablet Take 0.5 tablets (40 mg total) by mouth every  evening. 05/24/17  Yes Marletta Lor, MD  Calcium Carbonate Antacid (TUMS PO) Take 1-3 tablets by mouth as needed (for gas or indigestion).    Yes [provider]  ciprofloxacin (CIPRO) 500 MG tablet Take 500 mg by mouth daily with supper. FOR 5 DAYS 10/15/18 10/20/18 Yes [provider]  flavoxATE (URISPAS) 100 MG tablet Take 1 tablet (100 mg total) by mouth 3 (three) times daily as needed for bladder spasms (1st dose now). Patient taking differently: Take 100 mg by mouth 3 (three) times daily as needed for bladder spasms.  10/07/18  Yes Swayze, Ava, DO  furosemide (LASIX) 20 MG tablet Take 1 tablet (20 mg total) by mouth daily. 10/01/18 09/26/19 Yes Burnell Blanks, MD  isosorbide mononitrate (IMDUR) 30 MG 24 hr tablet Take 0.5 tablets (15 mg total) by mouth daily. 09/13/18 12/12/18 Yes Isaiah Serge, NP  Multiple Vitamin (MULTIVITAMIN WITH MINERALS) TABS Take 1 tablet by mouth daily with breakfast.    Yes [provider]  nitroGLYCERIN (NITROSTAT) 0.4 MG SL tablet Place 0.4 mg under the tongue every 5 (five) minutes as needed for chest pain.    Yes [provider]  omeprazole (PRILOSEC) 40 MG capsule Take 1 capsule (40 mg total) by mouth daily. Patient taking differently: Take 40 mg by mouth daily before breakfast.  09/11/18  Yes Isaac Bliss, [name], MD  ondansetron (ZOFRAN ODT) 4 MG disintegrating tablet Take 1 tablet (4 mg total) by mouth every 8 (eight) hours as needed for nausea or vomiting. 09/11/18  Yes Isaac Bliss, [name], MD  oxyCODONE (OXY IR/ROXICODONE) 5 MG immediate release tablet Take 1 tablet (5 mg total) by mouth every 4 (four) hours as needed for severe pain. 10/07/18  Yes Swayze, Ava, DO  Polyethyl Glycol-Propyl Glycol (SYSTANE) 0.4-0.3 % SOLN Place 1 drop into both eyes 3 (three) times daily.    Yes [provider]  sorbitol 70 % SOLN Take 30 mLs by mouth as needed for moderate constipation. Patient taking differently:  Take 15-30 mLs by mouth as needed for moderate constipation.  10/07/18  Yes Swayze, Ava, DO  Nutritional Supplements (FEEDING SUPPLEMENT, NEPRO CARB STEADY,) LIQD Take 237 mLs by mouth 3 (three) times daily as needed (Supplement). Patient taking differently: Take 237 mLs by mouth 3 (three) times daily as needed (for supplementation).  10/07/18   Swayze, Ava, DO   Physical Exam: Blood pressure 106/77, pulse 69, temperature (!) 97.5 F (36.4 C), temperature source Oral, resp. rate 18, SpO2 98 %. 1. General:  in No  Acute distress     Chronically ill  -appearing 2. Psychological: Alert and  Oriented to self and situation 3. Head/ENT:  Dry Mucous Membranes  Head Non traumatic, neck supple                           Poor Dentition 4. SKIN:  decreased Skin turgor,  Skin clean Dry and intact no rash 5. Heart: Regular rate and rhythm no  Murmur, no Rub or gallop 6. Lungs: no wheezes or crackles   7. Abdomen: Soft, epigastric tender, Non distended bowel sounds present 8. Lower extremities: no clubbing, cyanosis, no  edema 9. Neurologically Grossly intact, moving all 4 extremities equally 10. MSK: Normal range of motion   All other LABS:     Recent Labs  Lab 10/17/18 1554 10/17/18 1707 10/17/18 1732  WBC 30.0* 27.1*  --   NEUTROABS  --  25.7*  --   HGB 8.6* 9.0* 8.5*  HCT 26.6* 27.0* 25.0*  MCV 97.1 95.4  --   PLT 133* 131*  --      Recent Labs  Lab 10/17/18 1554 10/17/18 1732  NA 131* 132*  K 4.5 4.3  CL 100  --   CO2 15*  --   GLUCOSE 148*  --   BUN 114*  --   CREATININE 8.26*  --   CALCIUM 8.5*  --      No results for input(s): AST, ALT, ALKPHOS, BILITOT, PROT, ALBUMIN in the last 168 hours.     Cultures:    Component Value Date/Time   SDES URINE, RANDOM 09/14/2018 1011   SPECREQUEST  09/14/2018 1011    NONE Performed at [facility], [place] [address] [place],  [address]    CULT >=100,000 COLONIES/mL STENOTROPHOMONAS MALTOPHILIA  (A) 09/14/2018 1011   REPTSTATUS 09/17/2018 FINAL 09/14/2018 1011     Radiological Exams on Admission: Ct Abdomen Pelvis Wo Contrast  Result Date: 10/17/2018 CLINICAL DATA:  Fever, body aches, and cystitis. Evaluate for pyelonephritis. EXAM: CT ABDOMEN AND PELVIS WITHOUT CONTRAST TECHNIQUE: Multidetector CT imaging of the abdomen and pelvis was performed following the standard protocol without IV contrast. COMPARISON:  Renal ultrasound dated October 04, 2018. CT abdomen pelvis dated September 15, 2018. FINDINGS: Lower chest: New trace left pleural effusion. Unchanged cardiomegaly and bibasilar scarring. Hepatobiliary: No focal liver abnormality is seen. Status post cholecystectomy. No biliary dilatation. Pancreas: Atrophic. No ductal dilatation or surrounding inflammatory changes. Spleen: Normal in size without focal abnormality. Adrenals/Urinary Tract: The adrenal glands are unremarkable. Unchanged moderate to severe bilateral hydroureteronephrosis. Unchanged 5.8 cm right renal simple cyst. The bladder remains decompressed by a suprapubic catheter. Stomach/Bowel: Stomach is within normal limits. Appendix appears normal. No evidence of bowel wall thickening, distention, or inflammatory changes. Unchanged sigmoid diverticulosis. Vascular/Lymphatic: Aortic atherosclerosis. No enlarged abdominal or pelvic lymph nodes. Reproductive: Prostatic radiation seed implants again noted. Other: Unchanged small fat containing bilateral inguinal hernias. No free fluid or pneumoperitoneum. Musculoskeletal: No acute or significant osseous findings. IMPRESSION: 1. Evaluation for pyelonephritis is limited without intravenous contrast. Unchanged moderate to severe bilateral hydroureteronephrosis. 2. New trace left pleural effusion. 3.  Aortic atherosclerosis (ICD10-I70.0). Electronically Signed   By: Titus Dubin M.D.   On: 10/17/2018 19:37   Dg Chest Portable 1 View  Result Date: 10/17/2018 CLINICAL DATA:  Fever today, shortness  of breath and body aches for 3 months, history hypertension, coronary artery disease post MI, CHF EXAM: PORTABLE CHEST 1 VIEW COMPARISON:  Portable exam 1659 hours compared to 10/04/2018 FINDINGS: Enlargement of cardiac silhouette post CABG. Mediastinal contours and pulmonary vascularity normal. Mild chronic elevation of LEFT  diaphragm with mild LEFT basilar subsegmental atelectasis. Upper lungs clear. No pleural effusion or pneumothorax. Bones demineralized. IMPRESSION: Subsegmental atelectasis LEFT base. Enlargement of cardiac silhouette post CABG. Electronically Signed   By: Lavonia Dana M.D.   On: 10/17/2018 17:33    Chart has been reviewed    Assessment/Plan  85 y.o. male with medical history significant of  CAD s/p CABG in 1989 and redo bypass in 1997, HTN, HLD, mild dementia, prostate cancer and atrial fibrillation, history of kidney stones and urethral stricture  sp suprapubic catheter, GERD  Admitted for sepsis due to Uti  Present on Admission:  Sepsis ([place]) -  -SIRS criteria met with  elevated white blood cell count,  fever.    With  evidence of end organ damage such as  acute renal failure,    -Most likely source being:  Urinary   - Obtain serial lactic acid and procalcitonin level.  - Initiate IV antibiotics   - await results of blood and urine culture  - Rehydrate     HTN (hypertension) given soft  blood pressures in the emergency department will hold Imdur for tonight    HYPERCHOLESTEROLEMIA   stable continue home medications     Urinary tract infection associated with cystostomy catheter ([place]) -reviewed prior sensitivities patient have had Staphylococcus stenotrophomonas and Pseudomonas Discussed with pharmacy and most likely meropenem should be able to cover all the above since patient has failed outpatient Cipro will attempt to use that await results of urine culture  Coronary artery disease involving coronary bypass graft of native heart without angina pectoris chronic  stable continue statin not a candidate for beta-blocker given history of bradycardia with beta-blockers   Debility will have PT OT evaluate  AKI (acute kidney injury) ([place]) we will attempt to rehydrate gently and see if improves if not we will need nephrology consult CT showing no evidence of obstructive process bladder is decompressed but he has chronic bilateral hydronephrosis nonetheless which is unchanged from prior if no improvement may need nephrology/urology consult   Chronic combined systolic (congestive) and diastolic (congestive) heart failure ([place]) currently on the dry side will hold Lasix  PAF (paroxysmal atrial fibrillation) (HCC)-for now continue Eliquis currently rate controlled    Anemia due to chronic kidney disease may be contributing to patient's sensation of shortness of breath continue to follow transfuse as needed obtain type and screen and anemia panel   Other plan as per orders.  DVT prophylaxis:  eliquis  Code Status:  FULL CODE   as per family    Family Communication:   Family not at  Bedside   Disposition Plan:   To home once workup is complete and patient is stable                   Would benefit from PT/OT eval prior to DC  Ordered                                       Consults called: none Admission status:  ED Disposition    ED Disposition Condition [place]: [place] [[id number]]  Level of Care: Progressive [102]  I expect the patient will be discharged within 24 hours: No (not a candidate for 5C-Observation unit)  Covid Evaluation: Confirmed COVID Negative  Diagnosis: Sepsis [facility]) [[id number]]  Admitting Physician: Toy Baker [[id number]]  Attending Physician: Roel Cluck  Rosita Guzzetta [[id number]]  PT Class (Do Not Modify): Observation [104]  PT Acc Code (Do Not Modify): Observation [10022]         Obs    Level of care    tele  For 12H   Precautions:  NONE  Airborne and Contact precautions  PPE: Used by  the provider:   P100  eye Goggles,  Gloves    Total critical care time excluding separately billable procedures: 60*  Minutes.    Toy Baker 10/17/2018, 8:17 PM    [facility]     after 2 AM please page floor coverage PA If 7AM-7PM, please contact the day team taking care of the patient using [email or web address]

## 2018-10-17 NOTE — ED Notes (Signed)
PT given water.

## 2018-10-17 NOTE — ED Notes (Signed)
ED TO INPATIENT HANDOFF REPORT  ED Nurse Name and Phone #:  [phone]  S Name/Age/Gender Ronald [name] 85 y.o. male Room/Bed: 030C/030C  Code Status   Code Status: Full Code  Home/SNF/Other Home Patient oriented to: self, place, time and situation Is this baseline? Yes   Triage Complete: Triage complete  Chief Complaint SHOB, Pain all over  Triage Note Per [facility], Pt reports SHOB and generalized body aches since 3 months ago. Pt reports dx with bladder infection started on Cipro, has chronic foley. Pt's wife reports fever of 101 at home, pt denies fever, afebrile here. Pt's wife sent him to be checked out.    Allergies Allergies  Allergen Reactions  . Lidocaine Shortness Of Breath and Swelling    Mouth swelling   . Pantoprazole Other (See Comments)    Made the patient lightheaded and dizzy  . Nitrofurantoin Hives and Other (See Comments)    Also made the patient lightheaded and dizzy  . Sulfa Antibiotics Hives and Itching  . Tape Other (See Comments)    TAPE WILL TEAR THE SKIN!!!    Level of Care/Admitting Diagnosis ED Disposition    ED Disposition Condition Comment   Admit  Hospital Area: [place] [[id number]]  Level of Care: Telemetry Medical [104]  I expect the patient will be discharged within 24 hours: No (not a candidate for 5C-Observation unit)  Covid Evaluation: Confirmed COVID Negative  Diagnosis: Sepsis [facility]) [[id number]]  Admitting Physician: Toy Baker [[id number]]  Attending Physician: Toy Baker [[id number]]  PT Class (Do Not Modify): Observation [104]  PT Acc Code (Do Not Modify): Observation [10022]       B Medical/Surgery History Past Medical History:  Diagnosis Date  . Anemia 09/14/2018  . Arthritis    "joints ache" (01/02/2014)  . At risk for sleep apnea    STOP-BANG= 4    SENT TO PCP 12-23-2013  . Bladder calculi   . Bradycardia   . Chronic cystitis   . Chronic systolic CHF (congestive heart failure) (HCC)     a. EF improved to 50-55% in 2018, previously 35-40%  . CKD (chronic kidney disease), stage II   . Coronary artery disease CARDIOLOGIST-  DR Angelena Form   a. CAD s/p CABG in 1989. b. redo bypass in 1997. c. last cath 2015 - med rx.  . Diverticulosis   . GERD (gastroesophageal reflux disease)   . History of Bell's palsy    RIGHT SIDE-- NO RESIDUAL  . Hx of dizziness   . Hyperlipidemia   . Hypertension    "not anymore" (01/02/2014)  . Ischemic cardiomyopathy   . Kidney stones "years ago"   "passed them"  . Melanoma of ear ([place])    "right"  . Mild dementia ([place])   . Myocardial infarction ([place]) 1986; 1997  . Nocturia   . OAB (overactive bladder)   . Persistent atrial fibrillation   . Prostate cancer ([place]) 1998   S/P  [place]; [place]  . S/P CABG (coronary artery bypass graft)    [place]  . Sepsis due to urinary tract infection ([place])   . Urethral stricture   . UTI (urinary tract infection) 09/2015  . Wears hearing aid    bilateral  . Wears partial dentures    Past Surgical History:  Procedure Laterality Date  . CARDIAC CATHETERIZATION  02-22-2005  dr Vidal Schwalbe   mild to moderate lv dysfunction with inferobasilar akinesis/  totally  occluded SVG to Intermediate Diagonal and SVG to PDA and RCA branches, totally occluded native coronary circulation with diffuse disease pLAD diagonal system with potentially could be ischemic/  patent SVG to OM with collaterals to dRCA and patent LIMA to LAD and diagonal systemss  . CARDIAC CATHETERIZATION  08-28-2013  DR Daneen Schick   widely patent sequential left internal graft to the diagonal/LAD, widely patent SVG to OM with proximal 50% narrowing noted in the graft, total occlusion SVG's to RCA, RI, and the Diagonal/ LV dysfunction with inferobasal aneurysm and mid anterior wall region of akinesis/  overall EF 35-40%/  Total occlusion of the navtive circulation/  no significant change compared to 2006 cath  .  CARDIOVASCULAR STRESS TEST  08-01-2011  dr Angelena Form   inferior scar and possible soft tissue attenuation with minimal peri-infarct ischemia, small region of anterior ischemia and scar/  LVEF 53% LV wall motion with inferior hypokinesis/ no significant change from scan july 2011  . CATARACT EXTRACTION W/ INTRAOCULAR LENS  IMPLANT, BILATERAL Bilateral 2007  . CORONARY ARTERY BYPASS GRAFT  11/ [place]-- 6 vessel/  1997 Re-do 5 vessel  . CYSTOSCOPY WITH URETHRAL DILATATION N/A 12/25/2013   Procedure: CYSTOSCOPY WITH URETHRAL DILATATION, WITH BIOPSY;  Surgeon: Bernestine Amass, MD;  [facility];  Service: Urology;  Laterality: N/A;  . CYSTOSCOPY WITH URETHRAL DILATATION N/A 12/22/2014   Procedure: CYSTOSCOPY WITH URETHRAL DILATATION;  Surgeon: Rana Snare, MD;  Location: [facility];  Service: Urology;  Laterality: N/A;  BALLOON DILATION CATHETER    . EUS  10/05/2011   Procedure: ESOPHAGEAL ENDOSCOPIC ULTRASOUND (EUS) RADIAL;  Surgeon: Arta Silence, MD;  Location: [facility];  Service: Endoscopy;  Laterality: N/A;  . INSERTION OF SUPRAPUBIC CATHETER N/A 12/22/2014   Procedure: INSERTION OF SUPRAPUBIC CATHETER ;  Surgeon: Rana Snare, MD;  Location: [facility];  Service: Urology;  Laterality: N/A;  . LAPAROSCOPIC CHOLECYSTECTOMY  12-23-2005  . LEFT HEART CATHETERIZATION WITH CORONARY/GRAFT ANGIOGRAM N/A 08/28/2013   Procedure: LEFT HEART CATHETERIZATION WITH Beatrix Fetters;  Surgeon: Sinclair Grooms, MD;  Location: [facility] CATH LAB;  Service: Cardiovascular;  Laterality: N/A;  . MELANOMA EXCISION  X 1   "ear"  . RADIOACTIVE PROSTATE SEED IMPLANTS  1998  . SKIN CANCER EXCISION  X 2   "top of head"  . TONSILLECTOMY AND ADENOIDECTOMY  1954     A IV Location/Drains/Wounds Patient Lines/Drains/Airways Status   Active Line/Drains/Airways    Name:   Placement date:   Placement time:   Site:   Days:   Peripheral IV 10/17/18 Right Forearm   10/17/18    1720     Forearm   less than 1   Suprapubic Catheter Latex 16 Fr.   03/06/18    0920    Latex   225   Suprapubic Catheter 16 Fr.   09/17/18    1500    -   30          Intake/Output Last 24 hours No intake or output data in the 24 hours ending 10/17/18 2104  Labs/Imaging Results for orders placed or performed during the hospital encounter of 10/17/18 (from the past 48 hour(s))  Basic metabolic panel     Status: Abnormal   Collection Time: 10/17/18  3:54 PM  Result Value Ref Range   Sodium 131 (L) 135 - 145 mmol/L   Potassium 4.5 3.5 - 5.1 mmol/L   Chloride 100 98 -  111 mmol/L   CO2 15 (L) 22 - 32 mmol/L   Glucose, Bld 148 (H) 70 - 99 mg/dL   BUN 114 (H) 8 - 23 mg/dL   Creatinine, Ser 8.26 (H) 0.61 - 1.24 mg/dL   Calcium 8.5 (L) 8.9 - 10.3 mg/dL   GFR calc non Af Amer 5 (L) >60 mL/min   GFR calc Af Amer 6 (L) >60 mL/min   Anion gap 16 (H) 5 - 15    Comment: Performed at [facility] [address], [place], [place] [address]  CBC     Status: Abnormal   Collection Time: 10/17/18  3:54 PM  Result Value Ref Range   WBC 30.0 (H) 4.0 - 10.5 K/uL   RBC 2.74 (L) 4.22 - 5.81 MIL/uL   Hemoglobin 8.6 (L) 13.0 - 17.0 g/dL   HCT 26.6 (L) 39.0 - 52.0 %   MCV 97.1 80.0 - 100.0 fL   MCH 31.4 26.0 - 34.0 pg   MCHC 32.3 30.0 - 36.0 g/dL   RDW 15.6 (H) 11.5 - 15.5 %   Platelets 133 (L) 150 - 400 K/uL    Comment: REPEATED TO VERIFY   nRBC 0.0 0.0 - 0.2 %    Comment: Performed at [facility], [place] [address] [place], [place] [address]  CBG monitoring, ED     Status: Abnormal   Collection Time: 10/17/18  5:04 PM  Result Value Ref Range   Glucose-Capillary 109 (H) 70 - 99 mg/dL  Differential     Status: Abnormal   Collection Time: 10/17/18  5:07 PM  Result Value Ref Range   Neutrophils Relative % 95 %   Neutro Abs 25.7 (H) 1.7 - 7.7 K/uL   Lymphocytes Relative 2 %   Lymphs Abs 0.5 (L) 0.7 - 4.0 K/uL   Monocytes Relative 3 %   Monocytes Absolute 0.8 0.1 - 1.0 K/uL   Eosinophils  Relative 0 %   Eosinophils Absolute 0.0 0.0 - 0.5 K/uL   Basophils Relative 0 %   Basophils Absolute 0.0 0.0 - 0.1 K/uL   WBC Morphology See Note     Comment: Increased Bands. >20% Bands  Dohle Bodies    nRBC 0 0 /100 WBC   Abs Immature Granulocytes 0.00 0.00 - 0.07 K/uL   Burr Cells PRESENT     Comment: Performed at [facility], [place] [address], [place], [place] [address]  CBC     Status: Abnormal   Collection Time: 10/17/18  5:07 PM  Result Value Ref Range   WBC 27.1 (H) 4.0 - 10.5 K/uL   RBC 2.83 (L) 4.22 - 5.81 MIL/uL   Hemoglobin 9.0 (L) 13.0 - 17.0 g/dL   HCT 27.0 (L) 39.0 - 52.0 %   MCV 95.4 80.0 - 100.0 fL   MCH 31.8 26.0 - 34.0 pg   MCHC 33.3 30.0 - 36.0 g/dL   RDW 15.4 11.5 - 15.5 %   Platelets 131 (L) 150 - 400 K/uL    Comment: REPEATED TO VERIFY   nRBC 0.0 0.0 - 0.2 %    Comment: Performed at [facility], [place] [address], [place], [place] [address]  SARS Coronavirus 2 (CEPHEID- Performed in [facility] hospital lab), Hosp Order     Status: None   Collection Time: 10/17/18  5:19 PM   Specimen: Nasopharyngeal  Result Value Ref Range   SARS Coronavirus 2 NEGATIVE NEGATIVE    Comment: (NOTE) If result is NEGATIVE  SARS-CoV-2 target nucleic acids are NOT DETECTED. The SARS-CoV-2 RNA is generally detectable in upper and lower  respiratory specimens during the acute phase of infection. The lowest  concentration of SARS-CoV-2 viral copies this assay can detect is 250  copies / mL. A negative result does not preclude SARS-CoV-2 infection  and should not be used as the sole basis for treatment or other  patient management decisions.  A negative result may occur with  improper specimen collection / handling, submission of specimen other  than nasopharyngeal swab, presence of viral mutation(s) within the  areas targeted by this assay, and inadequate number of viral copies  (<250 copies / mL). A negative result must be combined with clinical  observations,  patient history, and epidemiological information. If result is POSITIVE SARS-CoV-2 target nucleic acids are DETECTED. The SARS-CoV-2 RNA is generally detectable in upper and lower  respiratory specimens dur ing the acute phase of infection.  Positive  results are indicative of active infection with SARS-CoV-2.  Clinical  correlation with patient history and other diagnostic information is  necessary to determine patient infection status.  Positive results do  not rule out bacterial infection or co-infection with other viruses. If result is PRESUMPTIVE POSTIVE SARS-CoV-2 nucleic acids MAY BE PRESENT.   A presumptive positive result was obtained on the submitted specimen  and confirmed on repeat testing.  While 2019 novel coronavirus  (SARS-CoV-2) nucleic acids may be present in the submitted sample  additional confirmatory testing may be necessary for epidemiological  and / or clinical management purposes  to differentiate between  SARS-CoV-2 and other Sarbecovirus currently known to infect humans.  If clinically indicated additional testing with an alternate test  methodology [phone]) is advised. The SARS-CoV-2 RNA is generally  detectable in upper and lower respiratory sp ecimens during the acute  phase of infection. The expected result is Negative. Fact Sheet for Patients:  StrictlyIdeas.no Fact Sheet for Healthcare Providers: BankingDealers.co.za This test is not yet approved or cleared by the [place] FDA and has been authorized for detection and/or diagnosis of SARS-CoV-2 by FDA under an Emergency Use Authorization (EUA).  This EUA will remain in effect (meaning this test can be used) for the duration of the COVID-19 declaration under Section 564(b)(1) of the Act, 21 U.S.C. section 360bbb-3(b)(1), unless the authorization is terminated or revoked sooner. Performed at [facility], [place] [address] [place],  [place] [address]   Lactic acid, plasma     Status: None   Collection Time: 10/17/18  5:29 PM  Result Value Ref Range   Lactic Acid, Venous 1.8 0.5 - 1.9 mmol/L    Comment: Performed at [facility] [address] [place], [place] [address]  Urinalysis, Routine w reflex microscopic     Status: Abnormal   Collection Time: 10/17/18  5:30 PM  Result Value Ref Range   Color, Urine YELLOW YELLOW   APPearance TURBID (A) CLEAR   Specific Gravity, Urine 1.013 1.005 - 1.030   pH 5.0 5.0 - 8.0   Glucose, UA NEGATIVE NEGATIVE mg/dL   Hgb urine dipstick LARGE (A) NEGATIVE   Bilirubin Urine NEGATIVE NEGATIVE   Ketones, ur NEGATIVE NEGATIVE mg/dL   Protein, ur 100 (A) NEGATIVE mg/dL   Nitrite NEGATIVE NEGATIVE   Leukocytes,Ua LARGE (A) NEGATIVE   RBC / HPF >50 (H) 0 - 5 RBC/hpf   WBC, UA >50 (H) 0 - 5 WBC/hpf   Bacteria, UA MANY (A) NONE SEEN   WBC Clumps  PRESENT    Non Squamous Epithelial 6-10 (A) NONE SEEN    Comment: Performed at [facility], [place] [address], , [place] [address]  POCT I-Stat EG7     Status: Abnormal   Collection Time: 10/17/18  5:32 PM  Result Value Ref Range   pH, Ven 7.330 7.250 - 7.430   pCO2, Ven 30.7 (L) 44.0 - 60.0 mmHg   pO2, Ven 41.0 32.0 - 45.0 mmHg   Bicarbonate 16.2 (L) 20.0 - 28.0 mmol/L   TCO2 17 (L) 22 - 32 mmol/L   O2 Saturation 73.0 %   Acid-base deficit 9.0 (H) 0.0 - 2.0 mmol/L   Sodium 132 (L) 135 - 145 mmol/L   Potassium 4.3 3.5 - 5.1 mmol/L   Calcium, Ion 1.11 (L) 1.15 - 1.40 mmol/L   HCT 25.0 (L) 39.0 - 52.0 %   Hemoglobin 8.5 (L) 13.0 - 17.0 g/dL   Patient temperature HIDE    Sample type VENOUS   Lipase, blood     Status: None   Collection Time: 10/17/18  6:55 PM  Result Value Ref Range   Lipase 23 11 - 51 U/L    Comment: Performed at [facility], [place] [address] [place], [place] [address]   Ct Abdomen Pelvis Wo Contrast  Result Date: 10/17/2018 CLINICAL DATA:  Fever, body aches, and cystitis. Evaluate for  pyelonephritis. EXAM: CT ABDOMEN AND PELVIS WITHOUT CONTRAST TECHNIQUE: Multidetector CT imaging of the abdomen and pelvis was performed following the standard protocol without IV contrast. COMPARISON:  Renal ultrasound dated October 04, 2018. CT abdomen pelvis dated September 15, 2018. FINDINGS: Lower chest: New trace left pleural effusion. Unchanged cardiomegaly and bibasilar scarring. Hepatobiliary: No focal liver abnormality is seen. Status post cholecystectomy. No biliary dilatation. Pancreas: Atrophic. No ductal dilatation or surrounding inflammatory changes. Spleen: Normal in size without focal abnormality. Adrenals/Urinary Tract: The adrenal glands are unremarkable. Unchanged moderate to severe bilateral hydroureteronephrosis. Unchanged 5.8 cm right renal simple cyst. The bladder remains decompressed by a suprapubic catheter. Stomach/Bowel: Stomach is within normal limits. Appendix appears normal. No evidence of bowel wall thickening, distention, or inflammatory changes. Unchanged sigmoid diverticulosis. Vascular/Lymphatic: Aortic atherosclerosis. No enlarged abdominal or pelvic lymph nodes. Reproductive: Prostatic radiation seed implants again noted. Other: Unchanged small fat containing bilateral inguinal hernias. No free fluid or pneumoperitoneum. Musculoskeletal: No acute or significant osseous findings. IMPRESSION: 1. Evaluation for pyelonephritis is limited without intravenous contrast. Unchanged moderate to severe bilateral hydroureteronephrosis. 2. New trace left pleural effusion. 3.  Aortic atherosclerosis (ICD10-I70.0). Electronically Signed   By: Titus Dubin M.D.   On: 10/17/2018 19:37   Dg Chest Portable 1 View  Result Date: 10/17/2018 CLINICAL DATA:  Fever today, shortness of breath and body aches for 3 months, history hypertension, coronary artery disease post MI, CHF EXAM: PORTABLE CHEST 1 VIEW COMPARISON:  Portable exam 1659 hours compared to 10/04/2018 FINDINGS: Enlargement of cardiac  silhouette post CABG. Mediastinal contours and pulmonary vascularity normal. Mild chronic elevation of LEFT diaphragm with mild LEFT basilar subsegmental atelectasis. Upper lungs clear. No pleural effusion or pneumothorax. Bones demineralized. IMPRESSION: Subsegmental atelectasis LEFT base. Enlargement of cardiac silhouette post CABG. Electronically Signed   By: Lavonia Dana M.D.   On: 10/17/2018 17:33    Pending Labs Unresulted Labs (From admission, onward)    Start     Ordered   10/18/18 0500  Vitamin B12  (Anemia Panel (PNL))  Tomorrow morning,   R     10/17/18 2001  10/18/18 0500  Folate  (Anemia Panel (PNL))  Tomorrow morning,   R     10/17/18 2001   10/18/18 0500  Iron and TIBC  (Anemia Panel (PNL))  Tomorrow morning,   R     10/17/18 2001   10/18/18 0500  Ferritin  (Anemia Panel (PNL))  Tomorrow morning,   R     10/17/18 2001   10/18/18 0500  Reticulocytes  (Anemia Panel (PNL))  Tomorrow morning,   R     10/17/18 2001   10/18/18 0500  Type and screen [place]  Once,   STAT    Comments: [place]    10/17/18 2002   10/17/18 1936  Sodium, urine, random  Add-on,   AD     10/17/18 1935   10/17/18 1936  Osmolality, urine  Add-on,   AD     10/17/18 1935   10/17/18 1936  Creatinine, urine, random  Add-on,   AD     10/17/18 1935   10/17/18 1729  CK  ONCE - STAT,   STAT     10/17/18 1728   10/17/18 1729  Lactic acid, plasma  Now then every 2 hours,   STAT     10/17/18 1728   10/17/18 1721  Urine culture  ONCE - STAT,   STAT     10/17/18 1721   10/17/18 1709  Blood culture (routine x 2)  BLOOD CULTURE X 2,   STAT     10/17/18 1708   Signed and Held  Lactic acid, plasma  STAT Now then every 3 hours,   STAT     Signed and Held   Signed and Held  Procalcitonin  Add-on,   R     Signed and Held   Signed and Held  Magnesium  Tomorrow morning,   R    Comments: Call MD if <1.5    Signed and Held   Signed and Held  Phosphorus  Tomorrow morning,   R      Signed and Held   Signed and Held  TSH  Once,   R    Comments: Cancel if already done within 1 month and notify MD    Signed and Held   Signed and Held  Comprehensive metabolic panel  Once,   R    Comments: Cal MD for K<3.5 or >5.0    Signed and Held   Signed and Held  CBC  Once,   R    Comments: Call for hg <8.0    Signed and Held          Vitals/Pain Today's Vitals   10/17/18 1700 10/17/18 1715 10/17/18 1930 10/17/18 1941  BP: 112/60 106/77 (!) 117/56   Pulse: 69 69 68   Resp: 18 18 15    Temp:      TempSrc:      SpO2: 97% 98% 100%   PainSc:    0-No pain    Isolation Precautions No active isolations  Medications Medications  sodium chloride flush (NS) 0.9 % injection 3 mL (has no administration in time range)  meropenem (MERREM) 1 g in sodium chloride 0.9 % 100 mL IVPB (has no administration in time range)  aztreonam (AZACTAM) 1 g in sodium chloride 0.9 % 100 mL IVPB (1 g Intravenous New Bag/Given 10/17/18 1936)    Mobility non-ambulatory Low fall risk   Focused Assessments Renal Assessment Handoff:  Hemodialysis Schedule:  Last Hemodialysis date and time:  Restricted appendage:      R Recommendations: See Admitting Provider Note  Report given to:   Additional Notes:

## 2018-10-17 NOTE — ED Notes (Signed)
Informed Lynnze - RN of pt's Hb result.

## 2018-10-17 NOTE — ED Provider Notes (Signed)
[facility] EMERGENCY DEPARTMENT Provider Note   CSN: [id number] Arrival date & time: 10/17/18  1528    History   Chief Complaint Chief Complaint  Patient presents with  . Shortness of Breath    HPI Ronald [name] is a 85 y.o. male presenting for evaluation of generalized pain and weakness.  Patient states he hurts all over, especially when anything touches him.  This is been going on for several days.  Patient states he is also feeling very weak, unable to stand up.  He states he is short of breath, this is been going on for several months.  He is a poor historian and is HOH.   Additional history obtained from wife.  Per wife, patient had a fever 2 days ago.  He was started on Cipro, 500 mg once a day, he has taken 2 doses.  As of 2 days ago, he started to become more weak, and was groaning frequently complaining of pain.  He has been more tired than normal.  Symptoms seem to improve yesterday, but then worsened again today.  Wife reports a temperature of 101 today.  Last dose of Tylenol was this morning before noon.  He states he has had decreased urine output in the past 2 days.  He is not more short of breath in the past 2 days than previously.  She denies cough.  She denies sick contacts.  Pt's nephrologist is Dr. Gilford Rile with [facility].      HPI  Past Medical History:  Diagnosis Date  . Anemia 09/14/2018  . Arthritis    "joints ache" (01/02/2014)  . At risk for sleep apnea    STOP-BANG= 4    SENT TO PCP 12-23-2013  . Bladder calculi   . Bradycardia   . Chronic cystitis   . Chronic systolic CHF (congestive heart failure) (HCC)    a. EF improved to 50-55% in 2018, previously 35-40%  . CKD (chronic kidney disease), stage II   . Coronary artery disease CARDIOLOGIST-  DR Angelena Form   a. CAD s/p CABG in 1989. b. redo bypass in 1997. c. last cath 2015 - med rx.  . Diverticulosis   . GERD (gastroesophageal reflux disease)   . History of Bell's palsy     RIGHT SIDE-- NO RESIDUAL  . Hx of dizziness   . Hyperlipidemia   . Hypertension    "not anymore" (01/02/2014)  . Ischemic cardiomyopathy   . Kidney stones "years ago"   "passed them"  . Melanoma of ear ([place])    "right"  . Mild dementia ([place])   . Myocardial infarction ([place]) 1986; 1997  . Nocturia   . OAB (overactive bladder)   . Persistent atrial fibrillation   . Prostate cancer ([place]) 1998   S/P  [place]; [place]  . S/P CABG (coronary artery bypass graft)    [place]  . Sepsis due to urinary tract infection ([place])   . Urethral stricture   . UTI (urinary tract infection) 09/2015  . Wears hearing aid    bilateral  . Wears partial dentures     Patient Active Problem List   Diagnosis Date Noted  . Bladder spasms   . Goals of care, counseling/discussion   . Chronic bilateral thoracic back pain   . Shortness of breath   . Palliative care by specialist   . Cardiorenal syndrome 10/04/2018  . Compression fracture of thoracic spine, non-traumatic,  initial encounter ([place]) 10/04/2018  . Anemia due to chronic kidney disease 09/17/2018  . Acute on chronic renal failure ([place]) 09/14/2018  . Elevated troponin 08/28/2018  . Normocytic anemia 08/28/2018  . UTI (urinary tract infection) 03/06/2018  . Chronic combined systolic (congestive) and diastolic (congestive) heart failure ([place]) 12/26/2017  . PAF (paroxysmal atrial fibrillation) ([place]) 12/26/2017  . Fever 05/11/2017  . AKI (acute kidney injury) ([place])   . Bacteremia due to Pseudomonas 01/21/2016  . Debility 01/21/2016  . History of Bell's palsy   . Gastroesophageal reflux disease without esophagitis   . Coronary artery disease involving coronary bypass graft of native heart without angina pectoris   . History of prostate cancer   . Suprapubic catheter ([place])   . Incontinence of feces   . Renal failure syndrome   . Bacteremia   . NSTEMI (non-ST elevated myocardial infarction) ([place])  01/16/2016  . ARF (acute renal failure) ([place]) 10/13/2015  . Influenza A 12/05/2014  . Urinary tract infection associated with cystostomy catheter ([place]) 10/03/2014  . Acute lower UTI 09/10/2014  . Bacteremia due to Enterococcus 01/22/2014  . Urethral stricture 12/25/2013  . Bladder calculus 12/25/2013  . LV dysfunction 09/23/2013  . Unstable angina ([place]) 08/27/2013  . Bacteremia due to Escherichia coli 05/20/2012  . Urinary tract infection 09/15/2011  . Calculus of kidney 09/12/2011  . HYPERCHOLESTEROLEMIA 10/18/2007  . HTN (hypertension) 10/18/2007  . OSTEOARTHRITIS 02/07/2007  . BELL'S PALSY, RIGHT 10/26/2006  . Coronary atherosclerosis 10/26/2006  . PROSTATE CANCER, HX OF 10/26/2006  . NEPHROLITHIASIS, HX OF 10/26/2006    Past Surgical History:  Procedure Laterality Date  . CARDIAC CATHETERIZATION  02-22-2005  dr Vidal Schwalbe   mild to moderate lv dysfunction with inferobasilar akinesis/  totally occluded SVG to Intermediate Diagonal and SVG to PDA and RCA branches, totally occluded native coronary circulation with diffuse disease pLAD diagonal system with potentially could be ischemic/  patent SVG to OM with collaterals to dRCA and patent LIMA to LAD and diagonal systemss  . CARDIAC CATHETERIZATION  08-28-2013  DR Daneen Schick   widely patent sequential left internal graft to the diagonal/LAD, widely patent SVG to OM with proximal 50% narrowing noted in the graft, total occlusion SVG's to RCA, RI, and the Diagonal/ LV dysfunction with inferobasal aneurysm and mid anterior wall region of akinesis/  overall EF 35-40%/  Total occlusion of the navtive circulation/  no significant change compared to 2006 cath  . CARDIOVASCULAR STRESS TEST  08-01-2011  dr Angelena Form   inferior scar and possible soft tissue attenuation with minimal peri-infarct ischemia, small region of anterior ischemia and scar/  LVEF 53% LV wall motion with inferior hypokinesis/ no significant change from scan july 2011  .  CATARACT EXTRACTION W/ INTRAOCULAR LENS  IMPLANT, BILATERAL Bilateral 2007  . CORONARY ARTERY BYPASS GRAFT  11/ [place]-- 6 vessel/  1997 Re-do 5 vessel  . CYSTOSCOPY WITH URETHRAL DILATATION N/A 12/25/2013   Procedure: CYSTOSCOPY WITH URETHRAL DILATATION, WITH BIOPSY;  Surgeon: Bernestine Amass, MD;  [facility];  Service: Urology;  Laterality: N/A;  . CYSTOSCOPY WITH URETHRAL DILATATION N/A 12/22/2014   Procedure: CYSTOSCOPY WITH URETHRAL DILATATION;  Surgeon: Rana Snare, MD;  Location: [facility];  Service: Urology;  Laterality: N/A;  BALLOON DILATION CATHETER    . EUS  10/05/2011   Procedure: ESOPHAGEAL ENDOSCOPIC ULTRASOUND (EUS) RADIAL;  Surgeon: Arta Silence, MD;  Location: [facility];  Service: Endoscopy;  Laterality: N/A;  .  INSERTION OF SUPRAPUBIC CATHETER N/A 12/22/2014   Procedure: INSERTION OF SUPRAPUBIC CATHETER ;  Surgeon: Rana Snare, MD;  Location: [facility];  Service: Urology;  Laterality: N/A;  . LAPAROSCOPIC CHOLECYSTECTOMY  12-23-2005  . LEFT HEART CATHETERIZATION WITH CORONARY/GRAFT ANGIOGRAM N/A 08/28/2013   Procedure: LEFT HEART CATHETERIZATION WITH Beatrix Fetters;  Surgeon: Sinclair Grooms, MD;  Location[facility] LAB;  Service: Cardiovascular;  Laterality: N/A;  . MELANOMA EXCISION  X 1   "ear"  . RADIOACTIVE PROSTATE SEED IMPLANTS  1998  . SKIN CANCER EXCISION  X 2   "top of head"  . TONSILLECTOMY AND ADENOIDECTOMY  1954        Home Medications    Prior to Admission medications   Medication Sig Start Date End Date Taking? Authorizing Provider  acetaminophen (TYLENOL) 500 MG tablet Take 500 mg by mouth every 6 (six) hours as needed for mild pain, fever or headache.    Yes [provider]  apixaban (ELIQUIS) 5 MG TABS tablet Take 2.5 mg by mouth 2 (two) times daily.   Yes [provider]  atorvastatin (LIPITOR) 80 MG tablet Take 0.5 tablets (40 mg total) by mouth every evening. 05/24/17  Yes  Marletta Lor, MD  Calcium Carbonate Antacid (TUMS PO) Take 1-3 tablets by mouth as needed (for gas or indigestion).    Yes [provider]  ciprofloxacin (CIPRO) 500 MG tablet Take 500 mg by mouth daily with supper. FOR 5 DAYS 10/15/18 10/20/18 Yes [provider]  flavoxATE (URISPAS) 100 MG tablet Take 1 tablet (100 mg total) by mouth 3 (three) times daily as needed for bladder spasms (1st dose now). Patient taking differently: Take 100 mg by mouth 3 (three) times daily as needed for bladder spasms.  10/07/18  Yes Swayze, Ava, DO  furosemide (LASIX) 20 MG tablet Take 1 tablet (20 mg total) by mouth daily. 10/01/18 09/26/19 Yes Burnell Blanks, MD  isosorbide mononitrate (IMDUR) 30 MG 24 hr tablet Take 0.5 tablets (15 mg total) by mouth daily. 09/13/18 12/12/18 Yes Isaiah Serge, NP  Multiple Vitamin (MULTIVITAMIN WITH MINERALS) TABS Take 1 tablet by mouth daily with breakfast.    Yes [provider]  nitroGLYCERIN (NITROSTAT) 0.4 MG SL tablet Place 0.4 mg under the tongue every 5 (five) minutes as needed for chest pain.    Yes [provider]  omeprazole (PRILOSEC) 40 MG capsule Take 1 capsule (40 mg total) by mouth daily. Patient taking differently: Take 40 mg by mouth daily before breakfast.  09/11/18  Yes Isaac Bliss, [name], MD  ondansetron (ZOFRAN ODT) 4 MG disintegrating tablet Take 1 tablet (4 mg total) by mouth every 8 (eight) hours as needed for nausea or vomiting. 09/11/18  Yes Isaac Bliss, [name], MD  oxyCODONE (OXY IR/ROXICODONE) 5 MG immediate release tablet Take 1 tablet (5 mg total) by mouth every 4 (four) hours as needed for severe pain. 10/07/18  Yes Swayze, Ava, DO  Polyethyl Glycol-Propyl Glycol (SYSTANE) 0.4-0.3 % SOLN Place 1 drop into both eyes 3 (three) times daily.    Yes [provider]  sorbitol 70 % SOLN Take 30 mLs by mouth as needed for moderate constipation. Patient taking differently: Take 15-30 mLs by  mouth as needed for moderate constipation.  10/07/18  Yes Swayze, Ava, DO  Nutritional Supplements (FEEDING SUPPLEMENT, NEPRO CARB STEADY,) LIQD Take 237 mLs by mouth 3 (three) times daily as needed (Supplement). Patient taking differently: Take 237 mLs by mouth 3 (  three) times daily as needed (for supplementation).  10/07/18   Swayze, Ava, DO    Family History Family History  Problem Relation Age of Onset  . Heart disease Mother   . Diabetes Father   . Heart disease Brother     Social History Social History   Tobacco Use  . Smoking status: Former Smoker    Years: 40.00    Types: Cigars    Quit date: 12/24/1983    Years since quitting: 34.8  . Smokeless tobacco: Former Systems developer    Types: Chew    Quit date: 08/28/2013  . Tobacco comment: smoked a pipe  Substance Use Topics  . Alcohol use: No  . Drug use: No     Allergies   Lidocaine, Pantoprazole, Nitrofurantoin, Sulfa antibiotics, and Tape   Review of Systems Review of Systems  Constitutional: Positive for fever.  Respiratory: Positive for shortness of breath.   Genitourinary: Positive for decreased urine volume.  Musculoskeletal: Positive for myalgias.  Neurological: Positive for weakness.  All other systems reviewed and are negative.    Physical Exam Updated Vital Signs BP 106/77   Pulse 69   Temp (!) 97.5 F (36.4 C) (Oral)   Resp 18   SpO2 98%   Physical Exam Vitals signs and nursing note reviewed.  Constitutional:      Comments: Elderly male who appears chronically ill.  HENT:     Head: Normocephalic and atraumatic.  Eyes:     Conjunctiva/sclera: Conjunctivae normal.     Pupils: Pupils are equal, round, and reactive to light.  Neck:     Musculoskeletal: Normal range of motion and neck supple.  Cardiovascular:     Rate and Rhythm: Normal rate and regular rhythm.     Pulses: Normal pulses.  Pulmonary:     Effort: Pulmonary effort is normal. No respiratory distress.     Breath sounds: Normal breath  sounds. No wheezing.     Comments: Speaking in short sentences.  Increased work of breathing with talking, although no signs of respiratory distress at rest. Abdominal:     Palpations: Abdomen is soft. There is no mass.     Tenderness: There is abdominal tenderness. There is no guarding or rebound.     Comments: Generalized tenderness palpation the abdomen.  Suprapubic catheter in place without signs of surrounding infection.   Musculoskeletal: Normal range of motion.     Right lower leg: Edema present.     Left lower leg: Edema present.     Comments: 1+ pitting edema bilaterally.  Skin:    General: Skin is warm and dry.     Capillary Refill: Capillary refill takes less than 2 seconds.  Neurological:     Mental Status: He is alert and oriented to person, place, and time.      ED Treatments / Results  Labs (all labs ordered are listed, but only abnormal results are displayed) Labs Reviewed  BASIC METABOLIC PANEL - Abnormal; Notable for the following components:      Result Value   Sodium 131 (*)    CO2 15 (*)    Glucose, Bld 148 (*)    BUN 114 (*)    Creatinine, Ser 8.26 (*)    Calcium 8.5 (*)    GFR calc non Af Amer 5 (*)    GFR calc Af Amer 6 (*)    Anion gap 16 (*)    All other components within normal limits  CBC - Abnormal; Notable for the following  components:   WBC 30.0 (*)    RBC 2.74 (*)    Hemoglobin 8.6 (*)    HCT 26.6 (*)    RDW 15.6 (*)    Platelets 133 (*)    All other components within normal limits  URINALYSIS, ROUTINE W REFLEX MICROSCOPIC - Abnormal; Notable for the following components:   APPearance TURBID (*)    Hgb urine dipstick LARGE (*)    Protein, ur 100 (*)    Leukocytes,Ua LARGE (*)    RBC / HPF >50 (*)    WBC, UA >50 (*)    Bacteria, UA MANY (*)    Non Squamous Epithelial 6-10 (*)    All other components within normal limits  DIFFERENTIAL - Abnormal; Notable for the following components:   Neutro Abs 25.7 (*)    Lymphs Abs 0.5 (*)     All other components within normal limits  CBC - Abnormal; Notable for the following components:   WBC 27.1 (*)    RBC 2.83 (*)    Hemoglobin 9.0 (*)    HCT 27.0 (*)    Platelets 131 (*)    All other components within normal limits  CBG MONITORING, ED - Abnormal; Notable for the following components:   Glucose-Capillary 109 (*)    All other components within normal limits  POCT I-STAT EG7 - Abnormal; Notable for the following components:   pCO2, Ven 30.7 (*)    Bicarbonate 16.2 (*)    TCO2 17 (*)    Acid-base deficit 9.0 (*)    Sodium 132 (*)    Calcium, Ion 1.11 (*)    HCT 25.0 (*)    Hemoglobin 8.5 (*)    All other components within normal limits  SARS CORONAVIRUS 2 (HOSPITAL ORDER, [place] [facility])  CULTURE, BLOOD (ROUTINE X 2)  CULTURE, BLOOD (ROUTINE X 2)  URINE CULTURE  LACTIC ACID, PLASMA  CK  LACTIC ACID, PLASMA    EKG EKG Interpretation  Date/Time:  Wednesday October 17 2018 15:38:56 EDT Ventricular Rate:  76 PR Interval:    QRS Duration: 110 QT Interval:  390 QTC Calculation: 438 R Axis:   15 Text Interpretation:  Atrial flutter with variable A-V block No significant change since last tracing Confirmed by Blanchie Dessert [phone]) on 10/17/2018 4:10:45 PM   Radiology Dg Chest Portable 1 View  Result Date: 10/17/2018 CLINICAL DATA:  Fever today, shortness of breath and body aches for 3 months, history hypertension, coronary artery disease post MI, CHF EXAM: PORTABLE CHEST 1 VIEW COMPARISON:  Portable exam 1659 hours compared to 10/04/2018 FINDINGS: Enlargement of cardiac silhouette post CABG. Mediastinal contours and pulmonary vascularity normal. Mild chronic elevation of LEFT diaphragm with mild LEFT basilar subsegmental atelectasis. Upper lungs clear. No pleural effusion or pneumothorax. Bones demineralized. IMPRESSION: Subsegmental atelectasis LEFT base. Enlargement of cardiac silhouette post CABG. Electronically Signed   By: Lavonia Dana  M.D.   On: 10/17/2018 17:33    Procedures Procedures (including critical care time)  Medications Ordered in ED Medications  sodium chloride flush (NS) 0.9 % injection 3 mL (has no administration in time range)  aztreonam (AZACTAM) 1 g in sodium chloride 0.9 % 100 mL IVPB (has no administration in time range)     Initial Impression / Assessment and Plan / ED Course  I have reviewed the triage vital signs and the nursing notes.  Pertinent labs & imaging results that were available during my care of the patient were reviewed by me  and considered in my medical decision making (see chart for details).        Patient presenting for evaluation of generalized pain and weakness.  Physical exam shows patient who appears chronically ill.  Generalized abdominal tenderness, though no obvious infection surrounding the placement of the suprapubic cath.  Urine is very dark, and urine output has decreased, consider dehydration.  However, patient also with a history of CHF and with 1+ pitting edema bilaterally, though this is likely baseline.  As such, will encourage p.o. fluids, but hold on IV fluids at this time.  Labs concerning in that patient creatinine went from 5 to 8.  White count elevated from 7 to 30.  Concern for UTI failing outpatient antibiotics.  Bicarb down at 15, this is likely metabolic in nature, however will obtain gas to check CO2.  Chest x-ray viewed interpreted by me, no pneumonia or pneumothroax. EKG uncahnged from previous. case discussed with attending, Dr Maryan Rued evaluated the pt.   Gas consistent with metabolic acidosis.  UA obviously infected with large leuks, greater than 50 white cells, many bacteria.  Sent for culture.  Will start aztreonam for failed OP UTI/possible pyelo. Will order ct abd pelvis without contrast for further evaluation. Will consult with hospitalist.   Discussed with Dr. Roel Cluck from [facility] hospitalist service, patient to be admitted.   Final Clinical  Impressions(s) / ED Diagnoses   Final diagnoses:  AKI (acute kidney injury) ([place])  Urinary tract infection without hematuria, site unspecified  Weakness    ED Discharge Orders    None       Franchot Heidelberg, PA-C 10/17/18 Mickle Mallory, MD 10/17/18 [id number]

## 2018-10-17 NOTE — ED Notes (Signed)
Patient transported to CT 

## 2018-10-17 NOTE — ED Triage Notes (Signed)
Per [facility], Pt reports SHOB and generalized body aches since 3 months ago. Pt reports dx with bladder infection started on Cipro, has chronic foley. Pt's wife reports fever of 101 at home, pt denies fever, afebrile here. Pt's wife sent him to be checked out.

## 2018-10-18 DIAGNOSIS — E7800 Pure hypercholesterolemia, unspecified: Secondary | ICD-10-CM | POA: Diagnosis present

## 2018-10-18 DIAGNOSIS — Z91048 Other nonmedicinal substance allergy status: Secondary | ICD-10-CM | POA: Diagnosis not present

## 2018-10-18 DIAGNOSIS — R531 Weakness: Secondary | ICD-10-CM | POA: Diagnosis present

## 2018-10-18 DIAGNOSIS — N19 Unspecified kidney failure: Secondary | ICD-10-CM | POA: Diagnosis not present

## 2018-10-18 DIAGNOSIS — R7881 Bacteremia: Secondary | ICD-10-CM | POA: Insufficient documentation

## 2018-10-18 DIAGNOSIS — D72829 Elevated white blood cell count, unspecified: Secondary | ICD-10-CM | POA: Diagnosis not present

## 2018-10-18 DIAGNOSIS — Z960 Presence of urogenital implants: Secondary | ICD-10-CM

## 2018-10-18 DIAGNOSIS — Z888 Allergy status to other drugs, medicaments and biological substances status: Secondary | ICD-10-CM

## 2018-10-18 DIAGNOSIS — I480 Paroxysmal atrial fibrillation: Secondary | ICD-10-CM | POA: Diagnosis not present

## 2018-10-18 DIAGNOSIS — I361 Nonrheumatic tricuspid (valve) insufficiency: Secondary | ICD-10-CM | POA: Diagnosis not present

## 2018-10-18 DIAGNOSIS — Z87891 Personal history of nicotine dependence: Secondary | ICD-10-CM | POA: Diagnosis not present

## 2018-10-18 DIAGNOSIS — I2510 Atherosclerotic heart disease of native coronary artery without angina pectoris: Secondary | ICD-10-CM

## 2018-10-18 DIAGNOSIS — I25810 Atherosclerosis of coronary artery bypass graft(s) without angina pectoris: Secondary | ICD-10-CM | POA: Diagnosis not present

## 2018-10-18 DIAGNOSIS — Z66 Do not resuscitate: Secondary | ICD-10-CM | POA: Diagnosis not present

## 2018-10-18 DIAGNOSIS — I5022 Chronic systolic (congestive) heart failure: Secondary | ICD-10-CM | POA: Diagnosis not present

## 2018-10-18 DIAGNOSIS — N186 End stage renal disease: Secondary | ICD-10-CM | POA: Diagnosis not present

## 2018-10-18 DIAGNOSIS — N189 Chronic kidney disease, unspecified: Secondary | ICD-10-CM | POA: Diagnosis not present

## 2018-10-18 DIAGNOSIS — Z515 Encounter for palliative care: Secondary | ICD-10-CM | POA: Diagnosis not present

## 2018-10-18 DIAGNOSIS — I132 Hypertensive heart and chronic kidney disease with heart failure and with stage 5 chronic kidney disease, or end stage renal disease: Secondary | ICD-10-CM | POA: Diagnosis not present

## 2018-10-18 DIAGNOSIS — B9562 Methicillin resistant Staphylococcus aureus infection as the cause of diseases classified elsewhere: Secondary | ICD-10-CM

## 2018-10-18 DIAGNOSIS — I130 Hypertensive heart and chronic kidney disease with heart failure and stage 1 through stage 4 chronic kidney disease, or unspecified chronic kidney disease: Secondary | ICD-10-CM | POA: Diagnosis not present

## 2018-10-18 DIAGNOSIS — Z8249 Family history of ischemic heart disease and other diseases of the circulatory system: Secondary | ICD-10-CM | POA: Diagnosis not present

## 2018-10-18 DIAGNOSIS — T83510A Infection and inflammatory reaction due to cystostomy catheter, initial encounter: Secondary | ICD-10-CM | POA: Diagnosis not present

## 2018-10-18 DIAGNOSIS — I351 Nonrheumatic aortic (valve) insufficiency: Secondary | ICD-10-CM | POA: Diagnosis not present

## 2018-10-18 DIAGNOSIS — F0150 Vascular dementia without behavioral disturbance: Secondary | ICD-10-CM | POA: Diagnosis not present

## 2018-10-18 DIAGNOSIS — I25709 Atherosclerosis of coronary artery bypass graft(s), unspecified, with unspecified angina pectoris: Secondary | ICD-10-CM | POA: Diagnosis not present

## 2018-10-18 DIAGNOSIS — I5042 Chronic combined systolic (congestive) and diastolic (congestive) heart failure: Secondary | ICD-10-CM | POA: Diagnosis not present

## 2018-10-18 DIAGNOSIS — A419 Sepsis, unspecified organism: Secondary | ICD-10-CM | POA: Diagnosis not present

## 2018-10-18 DIAGNOSIS — Z7901 Long term (current) use of anticoagulants: Secondary | ICD-10-CM | POA: Diagnosis not present

## 2018-10-18 DIAGNOSIS — D631 Anemia in chronic kidney disease: Secondary | ICD-10-CM | POA: Diagnosis not present

## 2018-10-18 DIAGNOSIS — Z951 Presence of aortocoronary bypass graft: Secondary | ICD-10-CM | POA: Diagnosis not present

## 2018-10-18 DIAGNOSIS — N3020 Other chronic cystitis without hematuria: Secondary | ICD-10-CM

## 2018-10-18 DIAGNOSIS — N136 Pyonephrosis: Secondary | ICD-10-CM | POA: Diagnosis present

## 2018-10-18 DIAGNOSIS — B9561 Methicillin susceptible Staphylococcus aureus infection as the cause of diseases classified elsewhere: Secondary | ICD-10-CM | POA: Diagnosis not present

## 2018-10-18 DIAGNOSIS — Y846 Urinary catheterization as the cause of abnormal reaction of the patient, or of later complication, without mention of misadventure at the time of the procedure: Secondary | ICD-10-CM | POA: Diagnosis present

## 2018-10-18 DIAGNOSIS — G8929 Other chronic pain: Secondary | ICD-10-CM | POA: Diagnosis present

## 2018-10-18 DIAGNOSIS — Z923 Personal history of irradiation: Secondary | ICD-10-CM

## 2018-10-18 DIAGNOSIS — C61 Malignant neoplasm of prostate: Secondary | ICD-10-CM

## 2018-10-18 DIAGNOSIS — F0390 Unspecified dementia without behavioral disturbance: Secondary | ICD-10-CM | POA: Diagnosis present

## 2018-10-18 DIAGNOSIS — Z20828 Contact with and (suspected) exposure to other viral communicable diseases: Secondary | ICD-10-CM | POA: Diagnosis present

## 2018-10-18 DIAGNOSIS — Z7189 Other specified counseling: Secondary | ICD-10-CM | POA: Diagnosis not present

## 2018-10-18 DIAGNOSIS — Z884 Allergy status to anesthetic agent status: Secondary | ICD-10-CM | POA: Diagnosis not present

## 2018-10-18 DIAGNOSIS — Z881 Allergy status to other antibiotic agents status: Secondary | ICD-10-CM

## 2018-10-18 DIAGNOSIS — M4854XA Collapsed vertebra, not elsewhere classified, thoracic region, initial encounter for fracture: Secondary | ICD-10-CM | POA: Diagnosis present

## 2018-10-18 DIAGNOSIS — N179 Acute kidney failure, unspecified: Secondary | ICD-10-CM | POA: Diagnosis not present

## 2018-10-18 DIAGNOSIS — R6520 Severe sepsis without septic shock: Secondary | ICD-10-CM | POA: Diagnosis not present

## 2018-10-18 DIAGNOSIS — A4102 Sepsis due to Methicillin resistant Staphylococcus aureus: Secondary | ICD-10-CM | POA: Diagnosis present

## 2018-10-18 DIAGNOSIS — I252 Old myocardial infarction: Secondary | ICD-10-CM | POA: Diagnosis not present

## 2018-10-18 DIAGNOSIS — Z87440 Personal history of urinary (tract) infections: Secondary | ICD-10-CM | POA: Diagnosis not present

## 2018-10-18 DIAGNOSIS — Z8546 Personal history of malignant neoplasm of prostate: Secondary | ICD-10-CM | POA: Diagnosis not present

## 2018-10-18 DIAGNOSIS — N390 Urinary tract infection, site not specified: Secondary | ICD-10-CM | POA: Diagnosis not present

## 2018-10-18 DIAGNOSIS — N184 Chronic kidney disease, stage 4 (severe): Secondary | ICD-10-CM | POA: Diagnosis not present

## 2018-10-18 DIAGNOSIS — I10 Essential (primary) hypertension: Secondary | ICD-10-CM | POA: Diagnosis not present

## 2018-10-18 LAB — IRON AND TIBC
Iron: 20 ug/dL — ABNORMAL LOW (ref 45–182)
Saturation Ratios: 10 % — ABNORMAL LOW (ref 17.9–39.5)
TIBC: 199 ug/dL — ABNORMAL LOW (ref 250–450)
UIBC: 179 ug/dL

## 2018-10-18 LAB — RETICULOCYTES
Immature Retic Fract: 13.2 % (ref 2.3–15.9)
RBC.: 2.76 MIL/uL — ABNORMAL LOW (ref 4.22–5.81)
Retic Count, Absolute: 43.3 10*3/uL (ref 19.0–186.0)
Retic Ct Pct: 1.6 % (ref 0.4–3.1)

## 2018-10-18 LAB — COMPREHENSIVE METABOLIC PANEL
ALT: 23 U/L (ref 0–44)
AST: 41 U/L (ref 15–41)
Albumin: 2.8 g/dL — ABNORMAL LOW (ref 3.5–5.0)
Alkaline Phosphatase: 142 U/L — ABNORMAL HIGH (ref 38–126)
Anion gap: 20 — ABNORMAL HIGH (ref 5–15)
BUN: 120 mg/dL — ABNORMAL HIGH (ref 8–23)
CO2: 16 mmol/L — ABNORMAL LOW (ref 22–32)
Calcium: 8.6 mg/dL — ABNORMAL LOW (ref 8.9–10.3)
Chloride: 99 mmol/L (ref 98–111)
Creatinine, Ser: 7.92 mg/dL — ABNORMAL HIGH (ref 0.61–1.24)
GFR calc Af Amer: 6 mL/min — ABNORMAL LOW (ref >60)
GFR calc non Af Amer: 5 mL/min — ABNORMAL LOW (ref >60)
Glucose, Bld: 146 mg/dL — ABNORMAL HIGH (ref 70–99)
Potassium: 4.7 mmol/L (ref 3.5–5.1)
Sodium: 135 mmol/L (ref 135–145)
Total Bilirubin: 0.6 mg/dL (ref 0.3–1.2)
Total Protein: 5.5 g/dL — ABNORMAL LOW (ref 6.5–8.1)

## 2018-10-18 LAB — BLOOD CULTURE ID PANEL (REFLEXED)

## 2018-10-18 LAB — PHOSPHORUS: Phosphorus: 7.6 mg/dL — ABNORMAL HIGH (ref 2.5–4.6)

## 2018-10-18 LAB — VITAMIN B12: Vitamin B-12: 1477 pg/mL — ABNORMAL HIGH (ref 180–914)

## 2018-10-18 LAB — CBC
HCT: 25.9 % — ABNORMAL LOW (ref 39.0–52.0)
Hemoglobin: 8.7 g/dL — ABNORMAL LOW (ref 13.0–17.0)
MCH: 31.5 pg (ref 26.0–34.0)
MCHC: 33.6 g/dL (ref 30.0–36.0)
MCV: 93.8 fL (ref 80.0–100.0)
Platelets: 125 10*3/uL — ABNORMAL LOW (ref 150–400)
RBC: 2.76 MIL/uL — ABNORMAL LOW (ref 4.22–5.81)
RDW: 15.4 % (ref 11.5–15.5)
WBC: 21.7 10*3/uL — ABNORMAL HIGH (ref 4.0–10.5)
nRBC: 0 % (ref 0.0–0.2)

## 2018-10-18 LAB — FERRITIN: Ferritin: 703 ng/mL — ABNORMAL HIGH (ref 24–336)

## 2018-10-18 LAB — CK: Total CK: 79 U/L (ref 49–397)

## 2018-10-18 LAB — TSH: TSH: 3.625 u[IU]/mL (ref 0.350–4.500)

## 2018-10-18 LAB — MAGNESIUM: Magnesium: 2.1 mg/dL (ref 1.7–2.4)

## 2018-10-18 LAB — ABO/RH: ABO/RH(D): AB POS

## 2018-10-18 LAB — FOLATE: Folate: 17.7 ng/mL (ref >5.9)

## 2018-10-18 LAB — TYPE AND SCREEN
ABO/RH(D): AB POS
Antibody Screen: NEGATIVE

## 2018-10-18 LAB — PROCALCITONIN: Procalcitonin: 47.49 ng/mL

## 2018-10-18 MED ORDER — SODIUM CHLORIDE 0.9 % IV SOLN
500.0000 mg | INTRAVENOUS | Status: DC
  Administered 2018-10-18: 500 mg via INTRAVENOUS
  Filled 2018-10-18 (×2): qty 10

## 2018-10-18 NOTE — Progress Notes (Signed)
PROGRESS NOTE    Ronald [name]  YSA:[id number] DOB: Sep 05, 1927 DOA: 10/17/2018 PCP: Isaac Bliss, [name], MD   Brief Narrative: Ronald [name] is a 85 y.o. medical history significant of CAD s/p CABG in 1989 and redo bypass in 1997, HTN, HLD, mild dementia, prostate cancer and atrial fibrillation,history of kidney stones and urethral stricture  sp suprapubic catheter, GERD. Patient presented with a sepsis picture and found to have MRSA bacteremia from MRSA complicated/recurrent UTI   Assessment & Plan:   Active Problems:   HYPERCHOLESTEROLEMIA   HTN (hypertension)   PROSTATE CANCER, HX OF   Urinary tract infection   Urinary tract infection associated with cystostomy catheter (HCC)   Coronary artery disease involving coronary bypass graft of native heart without angina pectoris   History of prostate cancer   Suprapubic catheter ([place])   Debility   AKI (acute kidney injury) ([place])   Chronic combined systolic (congestive) and diastolic (congestive) heart failure (HCC)   PAF (paroxysmal atrial fibrillation) (HCC)   Acute on chronic renal failure (HCC)   Anemia due to chronic kidney disease   Sepsis ([place])   MRSA bacteremia   Sepsis Present on admission. Secondary to bacteremia  MRSA Bacteremia Started on Daptomycin. ID consulted. -Transthoracic Echocardiogram  Essential hypertension Soft blood pressures in the ED. Holding antihypertensives for now.  Hyperlipidemia Statin held on admission  Recurrent UTI CAUTI History of multiple UTIs In setting of chronic suprapubic catheter. Currently, urine is positive for MRSA. -Management above  Paroxysmal atrial fibrillation -Continue Eliquis  CAD S/p CABG. Stable.  AKI on CKD Possibly secondary to hydronephrosis vs dehydration. -If no improvement, Urology and +/- nephrology consult. Patient has already expressed that he does not want hemodialysis -AM BMP  Chronic combined systolic and diastolic heart failure  Currently euvolemic on exam. -Continue to hold Imdur in setting of sepsis and bacteremia.  Goals of care Patient is currently enrolled in hospice care. Per chart review, he was previously made a DNR during recent palliative care discussion. Discussed with patient who was too distracted to give me useful information. Discussed with the patient's wife who confirms reversal of DNR to full code at this time  Anemia of chronic kidney disease Stable.   DVT prophylaxis: Eliquis Code Status:   Code Status: Full Code Family Communication: Wife on telephone (12 minutes) Disposition Plan: Discharge in several days   Consultants:   Infectious disease  Procedures:   None  Antimicrobials:  Aztreonam  Daptomycin    Subjective: No concerns today.  Objective: Vitals:   10/18/18 0545 10/18/18 0955 10/18/18 1231 10/18/18 1517  BP: 110/63 96/63 (!) 113/59 126/67  Pulse: 77 70 89 69  Resp: 14 14 16 15   Temp: 97.7 F (36.5 C) 97.6 F (36.4 C) 97.8 F (36.6 C) (!) 97.5 F (36.4 C)  TempSrc: Oral Axillary Oral Axillary  SpO2: 98% 98% 100% 99%  Weight: 59.5 kg     Height:        Intake/Output Summary (Last 24 hours) at 10/18/2018 1646 Last data filed at 10/18/2018 1500 Gross per 24 hour  Intake 360 ml  Output 1650 ml  Net -1290 ml   Filed Weights   10/17/18 2327 10/18/18 0545  Weight: 66 kg 59.5 kg    Examination:  General exam: Appears calm and comfortable  Respiratory system: Clear to auscultation. Respiratory effort normal. Cardiovascular system: S1 & S2 heard, RRR. No murmurs, rubs, gallops or clicks. Gastrointestinal system: Abdomen is nondistended, soft and nontender. No organomegaly  or masses felt. Normal bowel sounds heard. Central nervous system: Alert and oriented to self and place. No focal neurological deficits. Extremities: No edema. No calf tenderness Skin: No cyanosis. No rashes Psychiatry: Judgement and insight appear normal. Mood & affect appropriate.      Data Reviewed: I have personally reviewed following labs and imaging studies  CBC: Recent Labs  Lab 10/17/18 1554 10/17/18 1707 10/17/18 1732 10/18/18 0320  WBC 30.0* 27.1*  --  21.7*  NEUTROABS  --  25.7*  --   --   HGB 8.6* 9.0* 8.5* 8.7*  HCT 26.6* 27.0* 25.0* 25.9*  MCV 97.1 95.4  --  93.8  PLT 133* 131*  --  720*   Basic Metabolic Panel: Recent Labs  Lab 10/17/18 1554 10/17/18 1732 10/18/18 0320  NA 131* 132* 135  K 4.5 4.3 4.7  CL 100  --  99  CO2 15*  --  16*  GLUCOSE 148*  --  146*  BUN 114*  --  120*  CREATININE 8.26*  --  7.92*  CALCIUM 8.5*  --  8.6*  MG  --   --  2.1  PHOS  --   --  7.6*   GFR: Estimated Creatinine Clearance: 5.3 mL/min (A) (by C-G formula based on SCr of 7.92 mg/dL (H)). Liver Function Tests: Recent Labs  Lab 10/18/18 0320  AST 41  ALT 23  ALKPHOS 142*  BILITOT 0.6  PROT 5.5*  ALBUMIN 2.8*   Recent Labs  Lab 10/17/18 1855  LIPASE 23   No results for input(s): AMMONIA in the last 168 hours. Coagulation Profile: No results for input(s): INR, PROTIME in the last 168 hours. Cardiac Enzymes: Recent Labs  Lab 10/18/18 0320  CKTOTAL 79   BNP (last 3 results) Recent Labs    09/13/18 1503  PROBNP 10,359*   HbA1C: No results for input(s): HGBA1C in the last 72 hours. CBG: Recent Labs  Lab 10/17/18 1704  GLUCAP 109*   Lipid Profile: No results for input(s): CHOL, HDL, LDLCALC, TRIG, CHOLHDL, LDLDIRECT in the last 72 hours. Thyroid Function Tests: Recent Labs    10/18/18 0320  TSH 3.625   Anemia Panel: Recent Labs    10/18/18 0320  VITAMINB12 1,477*  FOLATE 17.7  FERRITIN 703*  TIBC 199*  IRON 20*  RETICCTPCT 1.6   Sepsis Labs: Recent Labs  Lab 10/17/18 1729 10/18/18 0320  PROCALCITON  --  47.49  LATICACIDVEN 1.8  --     Recent Results (from the past 240 hour(s))  Blood culture (routine x 2)     Status: None (Preliminary result)   Collection Time: 10/17/18  5:07 PM   Specimen: BLOOD RIGHT  FOREARM  Result Value Ref Range Status   Specimen Description BLOOD RIGHT FOREARM  Final   Special Requests   Final    BOTTLES DRAWN AEROBIC AND ANAEROBIC Blood Culture adequate volume   Culture  Setup Time   Final    GRAM POSITIVE COCCI IN CLUSTERS IN BOTH AEROBIC AND ANAEROBIC BOTTLES CRITICAL RESULT CALLED TO, READ BACK BY AND VERIFIED WITH: J. FRENS,PHARMD 1258 10/18/2018 TRosalia Hammers Performed at [facility], [place] [address] [place], [place] [address]    Culture [facility] POSITIVE COCCI  Final   Report Status PENDING  Incomplete  Blood Culture ID Panel (Reflexed)     Status: Abnormal   Collection Time: 10/17/18  5:07 PM  Result Value Ref Range Status   Enterococcus species NOT DETECTED NOT DETECTED Final  Listeria monocytogenes NOT DETECTED NOT DETECTED Final   Staphylococcus species DETECTED (A) NOT DETECTED Final    Comment: CRITICAL RESULT CALLED TO, READ BACK BY AND VERIFIED WITH: J. FRENS,PHARMD 1258 10/18/2018 T. TYSOR    Staphylococcus aureus (BCID) DETECTED (A) NOT DETECTED Final    Comment: Methicillin (oxacillin)-resistant Staphylococcus aureus (MRSA). MRSA is predictably resistant to beta-lactam antibiotics (except ceftaroline). Preferred therapy is vancomycin unless clinically contraindicated. Patient requires contact precautions if  hospitalized. CRITICAL RESULT CALLED TO, READ BACK BY AND VERIFIED WITH: J. FRENS,PHARMD 1258 10/18/2018 T. TYSOR    Methicillin resistance DETECTED (A) NOT DETECTED Final    Comment: CRITICAL RESULT CALLED TO, READ BACK BY AND VERIFIED WITH: J. FRENS,PHARMD 1258 10/18/2018 T. TYSOR    Streptococcus species NOT DETECTED NOT DETECTED Final   Streptococcus agalactiae NOT DETECTED NOT DETECTED Final   Streptococcus pneumoniae NOT DETECTED NOT DETECTED Final   Streptococcus pyogenes NOT DETECTED NOT DETECTED Final   Acinetobacter baumannii NOT DETECTED NOT DETECTED Final   Enterobacteriaceae species NOT DETECTED NOT DETECTED Final    Enterobacter cloacae complex NOT DETECTED NOT DETECTED Final   Escherichia coli NOT DETECTED NOT DETECTED Final   Klebsiella oxytoca NOT DETECTED NOT DETECTED Final   Klebsiella pneumoniae NOT DETECTED NOT DETECTED Final   Proteus species NOT DETECTED NOT DETECTED Final   Serratia marcescens NOT DETECTED NOT DETECTED Final   Haemophilus influenzae NOT DETECTED NOT DETECTED Final   Neisseria meningitidis NOT DETECTED NOT DETECTED Final   Pseudomonas aeruginosa NOT DETECTED NOT DETECTED Final   Candida albicans NOT DETECTED NOT DETECTED Final   Candida glabrata NOT DETECTED NOT DETECTED Final   Candida krusei NOT DETECTED NOT DETECTED Final   Candida parapsilosis NOT DETECTED NOT DETECTED Final   Candida tropicalis NOT DETECTED NOT DETECTED Final    Comment: Performed at [facility], [place] [address] [place], [place] [address]  SARS Coronavirus 2 (CEPHEID- Performed in [place] hospital lab), Hosp Order     Status: None   Collection Time: 10/17/18  5:19 PM   Specimen: Nasopharyngeal  Result Value Ref Range Status   SARS Coronavirus 2 NEGATIVE NEGATIVE Final    Comment: (NOTE) If result is NEGATIVE SARS-CoV-2 target nucleic acids are NOT DETECTED. The SARS-CoV-2 RNA is generally detectable in upper and lower  respiratory specimens during the acute phase of infection. The lowest  concentration of SARS-CoV-2 viral copies this assay can detect is 250  copies / mL. A negative result does not preclude SARS-CoV-2 infection  and should not be used as the sole basis for treatment or other  patient management decisions.  A negative result may occur with  improper specimen collection / handling, submission of specimen other  than nasopharyngeal swab, presence of viral mutation(s) within the  areas targeted by this assay, and inadequate number of viral copies  (<250 copies / mL). A negative result must be combined with clinical  observations, patient history, and epidemiological  information. If result is POSITIVE SARS-CoV-2 target nucleic acids are DETECTED. The SARS-CoV-2 RNA is generally detectable in upper and lower  respiratory specimens dur ing the acute phase of infection.  Positive  results are indicative of active infection with SARS-CoV-2.  Clinical  correlation with patient history and other diagnostic information is  necessary to determine patient infection status.  Positive results do  not rule out bacterial infection or co-infection with other viruses. If result is PRESUMPTIVE POSTIVE SARS-CoV-2 nucleic acids MAY BE PRESENT.   A presumptive positive  result was obtained on the submitted specimen  and confirmed on repeat testing.  While 2019 novel coronavirus  (SARS-CoV-2) nucleic acids may be present in the submitted sample  additional confirmatory testing may be necessary for epidemiological  and / or clinical management purposes  to differentiate between  SARS-CoV-2 and other Sarbecovirus currently known to infect humans.  If clinically indicated additional testing with an alternate test  methodology [phone]) is advised. The SARS-CoV-2 RNA is generally  detectable in upper and lower respiratory sp ecimens during the acute  phase of infection. The expected result is Negative. Fact Sheet for Patients:  StrictlyIdeas.no Fact Sheet for Healthcare Providers: BankingDealers.co.za This test is not yet approved or cleared by the [place] FDA and has been authorized for detection and/or diagnosis of SARS-CoV-2 by FDA under an Emergency Use Authorization (EUA).  This EUA will remain in effect (meaning this test can be used) for the duration of the COVID-19 declaration under Section 564(b)(1) of the Act, 21 U.S.C. section 360bbb-3(b)(1), unless the authorization is terminated or revoked sooner. Performed at [facility], [place] [address] [place], [place] [address]   Urine culture     Status:  Abnormal (Preliminary result)   Collection Time: 10/17/18  5:21 PM   Specimen: Urine, Random  Result Value Ref Range Status   Specimen Description URINE, RANDOM  Final   Special Requests   Final    NONE Performed at [facility], [place] [address], [place], [place] [address]    Culture >=100,000 COLONIES/mL STAPHYLOCOCCUS AUREUS (A)  Final   Report Status PENDING  Incomplete  Blood culture (routine x 2)     Status: None (Preliminary result)   Collection Time: 10/17/18  5:23 PM   Specimen: BLOOD  Result Value Ref Range Status   Specimen Description BLOOD LEFT ANTECUBITAL  Final   Special Requests   Final    BOTTLES DRAWN AEROBIC AND ANAEROBIC Blood Culture results may not be optimal due to an inadequate volume of blood received in culture bottles   Culture  Setup Time   Final    GRAM POSITIVE COCCI IN CLUSTERS IN BOTH AEROBIC AND ANAEROBIC BOTTLES CRITICAL RESULT CALLED TO, READ BACK BY AND VERIFIED WITH: J. FRENS,PHARMD 1258 10/18/2018 TRosalia Hammers Performed at [facility], [place] [address], [place], [place] [address]    Culture GRAM POSITIVE COCCI  Final   Report Status PENDING  Incomplete         Radiology Studies: Ct Abdomen Pelvis Wo Contrast  Result Date: 10/17/2018 CLINICAL DATA:  Fever, body aches, and cystitis. Evaluate for pyelonephritis. EXAM: CT ABDOMEN AND PELVIS WITHOUT CONTRAST TECHNIQUE: Multidetector CT imaging of the abdomen and pelvis was performed following the standard protocol without IV contrast. COMPARISON:  Renal ultrasound dated October 04, 2018. CT abdomen pelvis dated September 15, 2018. FINDINGS: Lower chest: New trace left pleural effusion. Unchanged cardiomegaly and bibasilar scarring. Hepatobiliary: No focal liver abnormality is seen. Status post cholecystectomy. No biliary dilatation. Pancreas: Atrophic. No ductal dilatation or surrounding inflammatory changes. Spleen: Normal in size without focal abnormality. Adrenals/Urinary Tract: The adrenal glands  are unremarkable. Unchanged moderate to severe bilateral hydroureteronephrosis. Unchanged 5.8 cm right renal simple cyst. The bladder remains decompressed by a suprapubic catheter. Stomach/Bowel: Stomach is within normal limits. Appendix appears normal. No evidence of bowel wall thickening, distention, or inflammatory changes. Unchanged sigmoid diverticulosis. Vascular/Lymphatic: Aortic atherosclerosis. No enlarged abdominal or pelvic lymph nodes. Reproductive: Prostatic radiation seed implants again noted. Other: Unchanged  small fat containing bilateral inguinal hernias. No free fluid or pneumoperitoneum. Musculoskeletal: No acute or significant osseous findings. IMPRESSION: 1. Evaluation for pyelonephritis is limited without intravenous contrast. Unchanged moderate to severe bilateral hydroureteronephrosis. 2. New trace left pleural effusion. 3.  Aortic atherosclerosis (ICD10-I70.0). Electronically Signed   By: Titus Dubin M.D.   On: 10/17/2018 19:37   Dg Chest Portable 1 View  Result Date: 10/17/2018 CLINICAL DATA:  Fever today, shortness of breath and body aches for 3 months, history hypertension, coronary artery disease post MI, CHF EXAM: PORTABLE CHEST 1 VIEW COMPARISON:  Portable exam 1659 hours compared to 10/04/2018 FINDINGS: Enlargement of cardiac silhouette post CABG. Mediastinal contours and pulmonary vascularity normal. Mild chronic elevation of LEFT diaphragm with mild LEFT basilar subsegmental atelectasis. Upper lungs clear. No pleural effusion or pneumothorax. Bones demineralized. IMPRESSION: Subsegmental atelectasis LEFT base. Enlargement of cardiac silhouette post CABG. Electronically Signed   By: Lavonia Dana M.D.   On: 10/17/2018 17:33        Scheduled Meds: . apixaban  2.5 mg Oral BID  . omeprazole  40 mg Oral Daily  . sodium chloride flush  3 mL Intravenous Once   Continuous Infusions: . DAPTOmycin (CUBICIN)  IV 500 mg (10/18/18 1608)     LOS: 0 days     Cordelia Poche,  MD [facility] 10/18/2018, 4:46 PM  If 7PM-7AM, please contact night-coverage [email or web address]

## 2018-10-18 NOTE — Progress Notes (Addendum)
[facility] 6E 30 - [facility] ([facility]) GIP RN Note  This is a related and covered GIP admission of 10/17/2018 with [facility] diagnosis of ESRD with Heart Failure per Dr. Konrad Dolores of [facility]  Patient is a Full Code. Family notified [facility] on call service that patient wife was going to activate EMS for transport to Hospital Emergency Room for evaluation of pain, fever and extreme SOB. Patient has been admitted to hospital for possible UTI/Sepsis.   Unable to visit patient due to COVID restrictions, so spoke to bedside RN regarding patient condition. [facility] Bedside RN stated that patient is more confused than last admission,  experiencing no pain/NAD at this time, and that SP cath continues to drain cloudy urine. [facility] RN also confirmed plan to gently rehydrate, adm IV Antibiotics and discharge when stable. Unable to reach patient in room via telephone so reached out to wife [name] to support and update on patient. Per Epic Notes:  MD Plan is to address Sepsis with IV Antibiotics, Rehydrate while awaiting Blood/Urine Cx, HTN - evaluate on telemetry unit, UTI - pharmacy to consult on Antibiotic tx and to discharge back home when workup is complete and patient is stable.  V/S: 97.6, 96/63, 70, 14 with 98% sats on RA I&O: Total Output: 1250 - Total Input:  UNK Abnormal Labs:CO2 16, Glucose 146, BUN 120, Creat 7.92, Cal 8.6, Anion Gap 20, Phos 7.6, 142, Albumin 2.8, Tot Pro 5.5, GFR 5, Iron 20, TIBC 199, Sat ratios 10, Ferritin 703, Vit B12 1.477, WBC 21.7, RBC 2.76, Hemo 8.7, HCT 25.9, Plate 125  Medications:Eliquis 2.5mg  tab BID, Liptitor 40mg  tab HS, Merrem 1g in NS 100mg  IVPB Q24hr. No continuous IVFs at this time.  PRN administered - 5-325mg  tabb of Hydrocodone adm at 0636 this morning.    Communication with IDG: Updated [place] team and notified [facility] of patient admission. Transfer Summary complete.  Communication with PC[facility]: Spoke to wife to support and update on patient. Made aware that [facility] will follow  patient daily during hospitalization.  Goals of Care: Continuing to evaluate as patient is recently admitted to Hospice. Symptom Management with support of [facility] in the home.   Discharge Planning: Per [place] is to discharge home with hospice once medically stable. Upon discharge, please contact [facility] for transport of [facility] patients when ambulance transportation is needed at [phone].  Gar Ponto, RN [facility] Liaison (in [place]) [phone]  Update: Completed Transfer Summary and Medication list faxed to unit secretary at [facility] who agreed to place on patient shadow chart.

## 2018-10-18 NOTE — Evaluation (Signed)
Occupational Therapy Evaluation Patient Details Name: Ronald [name] MRN: [id number] DOB: 11-28-1927 Today's Date: 10/18/2018    History of Present Illness Patient is a 85 y/o male who presents with fever fatigue and generalized body ache, decreased urine output from suprapubic catheter.. Recently admitted 6/19-6/25 for AKI and UTI. CXR-thoracic compression fxs. And 7/11 for SOB and swelling. PMh includes chronic cystitis and urolithiasis, stage II CKD, CAD/CABG, chronic systolic CHF, HTN, HLD, mild dementia, A-fib, UTIs, sepsis.   Clinical Impression   PTA Pt used DME for mobility and got assist from wife for ADL for safety. Pt today presents with decreased cognition (aknowledge baseline dementia) and safety as well as generlized weakness, deconditioning. Pt is min assist for ADL overall, mod A for LB ADL. And +2 for safety with mobility with RW today. Pt will benefit from skilled OT in the acute setting as well as afterwards at the SNF level. (recognize this nice man is working with hospice - ultimately they will have final say - but I do feel like a short stay at SNF will maximize safety and independence prior to dc home with assist from wife to return to [facility]).    Follow Up Recommendations  SNF;Supervision/Assistance - 24 hour(if OK with HOSPICE/WIFE)    Equipment Recommendations  3 in 1 bedside commode    Recommendations for Other Services       Precautions / Restrictions Precautions Precautions: Fall Restrictions Weight Bearing Restrictions: No      Mobility Bed Mobility Overal bed mobility: Needs Assistance Bed Mobility: Supine to Sit     Supine to sit: Supervision     General bed mobility comments: requires increased time and effort   Transfers Overall transfer level: Needs assistance Equipment used: Rolling walker (2 wheeled) Transfers: Sit to/from [facility] Sit to Stand: Min assist Stand pivot transfers: Max assist       General transfer  comment: impulsive, requires cues to wait until therapist ready, min A for initial boost and balance - Pt "legs going out" and so required max A and use of gait belt to assist with SPT to recliner to prevent fall    Balance Overall balance assessment: Needs assistance Sitting-balance support: Feet supported Sitting balance-Leahy Scale: Good     Standing balance support: Bilateral upper extremity supported Standing balance-Leahy Scale: Fair Standing balance comment: able to maintain static standing with min guard assist                            ADL either performed or assessed with clinical judgement   ADL Overall ADL's : Needs assistance/impaired Eating/Feeding: Set up;Sitting Eating/Feeding Details (indicate cue type and reason): assist to open some containers, eating soup with a straw Grooming: Wash/dry hands;Wash/dry face;Oral care;Min guard;Standing Grooming Details (indicate cue type and reason): sink level, fatigues quickly and gets SOB Upper Body Bathing: Set up;Sitting   Lower Body Bathing: Min guard;Sit to/from stand   Upper Body Dressing : Set up;Sitting   Lower Body Dressing: Moderate assistance;Sit to/from stand   Toilet Transfer: Minimal assistance;Ambulation;RW;+2 for safety/equipment;Cueing for safety;Cueing for sequencing Toilet Transfer Details (indicate cue type and reason): assist to manage walker, vc for safety Toileting- Clothing Manipulation and Hygiene: Minimal assistance;Sit to/from stand       Functional mobility during ADLs: Minimal assistance;+2 for safety/equipment;Rolling walker General ADL Comments: pt semi-impulsive and decreased safety awareness.      Vision Baseline Vision/History: Wears glasses Wears Glasses: At all times  Patient Visual Report: No change from baseline       Perception     Praxis      Pertinent Vitals/Pain Pain Assessment: No/denies pain     Hand Dominance Right   Extremity/Trunk Assessment Upper  Extremity Assessment Upper Extremity Assessment: Generalized weakness   Lower Extremity Assessment Lower Extremity Assessment: Generalized weakness   Cervical / Trunk Assessment Cervical / Trunk Assessment: Kyphotic   Communication Communication Communication: HOH   Cognition Arousal/Alertness: Awake/alert Behavior During Therapy: WFL for tasks assessed/performed Overall Cognitive Status: No family/caregiver present to determine baseline cognitive functioning Area of Impairment: Orientation;Awareness;Following commands;Safety/judgement                 Orientation Level: Disoriented to;Situation;Time Current Attention Level: Sustained   Following Commands: Follows one step commands with increased time Safety/Judgement: Decreased awareness of safety Awareness: Emergent Problem Solving: Requires verbal cues;Requires tactile cues General Comments: jokester, very pleasant   General Comments       Exercises     Shoulder Instructions      Home Living Family/patient expects to be discharged to:: Private residence Living Arrangements: Spouse/significant other Available Help at Discharge: Family;Available 24 hours/day Type of Home: House Home Access: Stairs to enter [employer] of Steps: 3 Entrance Stairs-Rails: Left Home Layout: One level     Bathroom Shower/Tub: Teacher, early years/pre: Handicapped height Bathroom Accessibility: No   Home Equipment: Environmental consultant - 2 wheels;Cane - single point   Additional Comments: Pt reports wife is able to assist him as needed       Prior Functioning/Environment Level of Independence: Independent with assistive device(s);Needs assistance  Gait / Transfers Assistance Needed: DME for mobility but likes to furniture walk ADL's / Homemaking Assistance Needed: mod I with DME, but relies on wife for IADL Communication / Swallowing Assistance Needed: HOH Comments: Furniture walker at baseline; uses [place] for community  distances,        OT Problem List: Decreased strength;Decreased activity tolerance;Impaired balance (sitting and/or standing);Decreased safety awareness;Decreased cognition      OT Treatment/Interventions: Self-care/ADL training;DME and/or AE instruction;Therapeutic activities;Patient/family education;Balance training    OT Goals(Current goals can be found in the care plan section) Acute Rehab OT Goals Patient Stated Goal: to improve strength OT Goal Formulation: With patient Time For Goal Achievement: 11/01/18 Potential to Achieve Goals: Good ADL Goals Pt Will Perform Grooming: with supervision;standing Pt Will Perform Upper Body Dressing: sitting;with modified independence Pt Will Perform Lower Body Dressing: with supervision;sit to/from stand Pt Will Transfer to Toilet: with supervision;ambulating Pt Will Perform Toileting - Clothing Manipulation and hygiene: with supervision;sit to/from stand  OT Frequency: Min 2X/week   Barriers to D/C:            Co-evaluation PT/OT/SLP Co-Evaluation/Treatment: Yes Reason for Co-Treatment: Necessary to address cognition/behavior during functional activity;For patient/therapist safety;To address functional/ADL transfers PT goals addressed during session: Mobility/safety with mobility;Balance;Proper use of DME OT goals addressed during session: ADL's and self-care;Proper use of Adaptive equipment and DME      AM-PAC OT "6 Clicks" Daily Activity     Outcome Measure Help from another person eating meals?: A Little Help from another person taking care of personal grooming?: A Little Help from another person toileting, which includes using toliet, bedpan, or urinal?: A Little Help from another person bathing (including washing, rinsing, drying)?: A Little Help from another person to put on and taking off regular upper body clothing?: A Little Help from another person to put on and taking  off regular lower body clothing?: A Lot 6 Click  Score: 17   End of Session Equipment Utilized During Treatment: Gait belt;Rolling walker Nurse Communication: Mobility status  Activity Tolerance: Patient tolerated treatment well Patient left: in chair;with call bell/phone within reach;with chair alarm set  OT Visit Diagnosis: Unsteadiness on feet (R26.81);Other symptoms and signs involving cognitive function;Muscle weakness (generalized) (M62.81);Repeated falls (R29.6);History of falling (Z91.81)                Time: 1140-1210 OT Time Calculation (min): 30 min Charges:  OT General Charges $OT Visit: 1 Visit OT Evaluation $OT Eval Moderate Complexity: [place] OTR/L Acute Rehabilitation Services Pager: [phone] Office: [place] 10/18/2018, 1:43 PM

## 2018-10-18 NOTE — Progress Notes (Signed)
PHARMACY - PHYSICIAN COMMUNICATION CRITICAL VALUE ALERT - BLOOD CULTURE IDENTIFICATION (BCID)  Ronald [name] is an 85 y.o. male who presented to [facility] on 10/17/2018 septic with a chief complaint of worsening shortness of breath and weakness,  purulent urine coming from his suprapubic catheter.  Assessment:  Patient presenting septic found to have MRSA bacteremia 3/4 bottles positive. Patient has very poor baseline renal function, will start on daptomycin instead of vancomycin for this reason. Patient is on atorvastatin which he will discontinue to avoid the extra risk of myopathies. We will also discontinue meropenem at this time.  Name of physician (or Provider) Contacted: Dr. Teryl Lucy, daptomycin discussed with Dr. Linus Salmons  Current antibiotics: Meropenem   Changes to prescribed antibiotics recommended:  Recommendations accepted by provider   Plan: Daptomycin 500mg  IV q48h Discontinue Meropenem Discontinue atorvastatin Check CK once weekly   Results for orders placed or performed during the hospital encounter of 10/17/18  Blood Culture ID Panel (Reflexed) (Collected: 10/17/2018  5:07 PM)  Result Value Ref Range   Enterococcus species NOT DETECTED NOT DETECTED   Listeria monocytogenes NOT DETECTED NOT DETECTED   Staphylococcus species DETECTED (A) NOT DETECTED   Staphylococcus aureus (BCID) DETECTED (A) NOT DETECTED   Methicillin resistance DETECTED (A) NOT DETECTED   Streptococcus species NOT DETECTED NOT DETECTED   Streptococcus agalactiae NOT DETECTED NOT DETECTED   Streptococcus pneumoniae NOT DETECTED NOT DETECTED   Streptococcus pyogenes NOT DETECTED NOT DETECTED   Acinetobacter baumannii NOT DETECTED NOT DETECTED   Enterobacteriaceae species NOT DETECTED NOT DETECTED   Enterobacter cloacae complex NOT DETECTED NOT DETECTED   Escherichia coli NOT DETECTED NOT DETECTED   Klebsiella oxytoca NOT DETECTED NOT DETECTED   Klebsiella pneumoniae NOT DETECTED NOT DETECTED   Proteus  species NOT DETECTED NOT DETECTED   Serratia marcescens NOT DETECTED NOT DETECTED   Haemophilus influenzae NOT DETECTED NOT DETECTED   Neisseria meningitidis NOT DETECTED NOT DETECTED   Pseudomonas aeruginosa NOT DETECTED NOT DETECTED   Candida albicans NOT DETECTED NOT DETECTED   Candida glabrata NOT DETECTED NOT DETECTED   Candida krusei NOT DETECTED NOT DETECTED   Candida parapsilosis NOT DETECTED NOT DETECTED   Candida tropicalis NOT DETECTED NOT DETECTED    Phillis Haggis 10/18/2018  1:22 PM

## 2018-10-18 NOTE — Evaluation (Signed)
Physical Therapy Evaluation Patient Details Name: Ronald [name] MRN: [id number] DOB: 1927/10/10 Today's Date: 10/18/2018   History of Present Illness  Patient is a 85 y/o male who presents with fever fatigue and generalized body ache, decreased urine output from suprapubic catheter.. Recently admitted 6/19-6/25 for AKI and UTI. CXR-thoracic compression fxs. And 7/11 for SOB and swelling. PMh includes chronic cystitis and urolithiasis, stage II CKD, CAD/CABG, chronic systolic CHF, HTN, HLD, mild dementia, A-fib, UTIs, sepsis.  Clinical Impression  Patient received in bed, pleasant, agrees to PT. Patient requires cues for safety as he is tried to get up prior to therapist being ready. Performs bed mobility and sit to stand transfer with min assist. Patient is able to ambulate 10 feet with RW and constant verbal and tactile cues to keep walker close to him/step up into RW. He became fatigued after about 10 feet requiring seated rest. He then brushed his teeth while standing at the sink and walked back another 10 feet when he became very fatigued and legs just gave out. Required max +2 assist to safely get into recliner. Patient will benefit from continued skilled PT to address his weakness, safety with mobility and difficulty walking.         Follow Up Recommendations SNF    Equipment Recommendations  None recommended by PT    Recommendations for Other Services       Precautions / Restrictions Precautions Precautions: Fall Restrictions Weight Bearing Restrictions: No      Mobility  Bed Mobility Overal bed mobility: Modified Independent             General bed mobility comments: requires increased time and effort   Transfers Overall transfer level: Needs assistance Equipment used: Rolling walker (2 wheeled) Transfers: Sit to/from [facility] Sit to Stand: Min guard Stand pivot transfers: Max assist       General transfer comment: impulsive, requires cues to  wait until therapist ready  Ambulation/Gait Ambulation/Gait assistance: Min guard Gait Distance (Feet): 20 Feet Assistive device: Rolling walker (2 wheeled) Gait Pattern/deviations: Step-to pattern;Shuffle;Decreased stride length;Decreased step length - right;Decreased step length - left;Trunk flexed Gait velocity: decreased   General Gait Details: requires constant cues to keep RW close to him, decreased foot clearance bilaterally, Cues and assist needed for obstacle avoidance in room. Patient required one seated rest after about 10 feet due to fatigue then walked another 10 feet, became very fatigued needing Max +2 to pivot into chair safely.  Stairs            Wheelchair Mobility    Modified Rankin (Stroke Patients Only)       Balance Overall balance assessment: Needs assistance Sitting-balance support: Feet supported Sitting balance-Leahy Scale: Good     Standing balance support: Bilateral upper extremity supported Standing balance-Leahy Scale: Fair Standing balance comment: able to maintain static standing with min guard assist                              Pertinent Vitals/Pain Pain Assessment: No/denies pain    Home Living Family/patient expects to be discharged to:: Private residence Living Arrangements: Spouse/significant other Available Help at Discharge: Family;Available 24 hours/day Type of Home: House Home Access: Stairs to enter Entrance Stairs-Rails: Left Entrance Stairs-Number of Steps: 3 Home Layout: One level Home Equipment: Walker - 2 wheels;Cane - single point Additional Comments: Pt reports wife is able to assist him as needed  Prior Function Level of Independence: Independent with assistive device(s)         Comments: Furniture walker at baseline; uses [facility] for community distances,     Hand Dominance   Dominant Hand: Right    Extremity/Trunk Assessment   Upper Extremity Assessment Upper Extremity Assessment:  Generalized weakness    Lower Extremity Assessment Lower Extremity Assessment: Generalized weakness    Cervical / Trunk Assessment Cervical / Trunk Assessment: Kyphotic  Communication   Communication: HOH  Cognition Arousal/Alertness: Awake/alert Behavior During Therapy: WFL for tasks assessed/performed Overall Cognitive Status: No family/caregiver present to determine baseline cognitive functioning Area of Impairment: Orientation;Awareness;Following commands;Safety/judgement                 Orientation Level: Disoriented to;Situation;Time Current Attention Level: Sustained   Following Commands: Follows one step commands with increased time Safety/Judgement: Decreased awareness of safety Awareness: Emergent Problem Solving: Requires verbal cues;Requires tactile cues General Comments: jokester, very pleasant      General Comments      Exercises     Assessment/Plan    PT Assessment Patient needs continued PT services  PT Problem List Decreased strength;Decreased mobility;Decreased safety awareness;Decreased activity tolerance;Decreased balance;Decreased cognition;Decreased knowledge of use of DME;Decreased knowledge of precautions;Decreased coordination       PT Treatment Interventions DME instruction;Therapeutic activities;Gait training;Therapeutic exercise;Patient/family education;Balance training;Functional mobility training    PT Goals (Current goals can be found in the Care Plan section)  Acute Rehab PT Goals Patient Stated Goal: to improve strength PT Goal Formulation: With patient Time For Goal Achievement: 10/26/18 Potential to Achieve Goals: Fair    Frequency Min 3X/week   Barriers to discharge Decreased caregiver support;Inaccessible home environment      Co-evaluation               AM-PAC PT "6 Clicks" Mobility  Outcome Measure Help needed turning from your back to your side while in a flat bed without using bedrails?: A Little Help  needed moving from lying on your back to sitting on the side of a flat bed without using bedrails?: A Little Help needed moving to and from a bed to a chair (including a wheelchair)?: A Little Help needed standing up from a chair using your arms (e.g., wheelchair or bedside chair)?: A Little Help needed to walk in hospital room?: A Lot Help needed climbing 3-5 steps with a railing? : Total 6 Click Score: 15    End of Session Equipment Utilized During Treatment: Gait belt Activity Tolerance: Patient limited by fatigue Patient left: in chair;with chair alarm set;with call bell/phone within reach Nurse Communication: Mobility status PT Visit Diagnosis: Unsteadiness on feet (R26.81);Muscle weakness (generalized) (M62.81);Difficulty in walking, not elsewhere classified (R26.2)    Time: 1140-1210 PT Time Calculation (min) (ACUTE ONLY): 30 min   Charges:   PT Evaluation $PT Eval Moderate Complexity: 1 Mod PT Treatments $Gait Training: 8-22 mins        Marcea Rojek, PT, GCS 10/18/18,1:10 PM

## 2018-10-18 NOTE — Progress Notes (Signed)
Pharmacy Antibiotic Note  Ronald [name] is a 85 y.o. male admitted on 10/17/2018 with sepsis.  Pharmacy has been consulted for Merrem dosing. Urinary most likely source. WBC elevated. Marked renal dysfunction. Hx of multiple organisms in his urine. Broad spectrum for now.  Plan: Merrem 1g IV q24h Trend WBC, temp, renal function  F/U infectious work-up F/U cultures for directed therapy  Height: 5\' 4"  (162.6 cm) Weight: 145 lb 8.1 oz (66 kg) IBW/kg (Calculated) : 59.2  Temp (24hrs), Avg:97.5 F (36.4 C), Min:97.5 F (36.4 C), Max:97.6 F (36.4 C)  Recent Labs  Lab 10/17/18 1554 10/17/18 1707 10/17/18 1729  WBC 30.0* 27.1*  --   CREATININE 8.26*  --   --   LATICACIDVEN  --   --  1.8    Estimated Creatinine Clearance: 5.1 mL/min (A) (by C-G formula based on SCr of 8.26 mg/dL (H)).    Allergies  Allergen Reactions  . Lidocaine Shortness Of Breath and Swelling    Mouth swelling   . Pantoprazole Other (See Comments)    Made the patient lightheaded and dizzy  . Nitrofurantoin Hives and Other (See Comments)    Also made the patient lightheaded and dizzy  . Sulfa Antibiotics Hives and Itching  . Tape Other (See Comments)    TAPE WILL TEAR THE SKIN!!!   Narda Bonds, PharmD, BCPS Clinical Pharmacist Phone: [phone]

## 2018-10-18 NOTE — Consult Note (Signed)
[facility] for Infectious Disease    Date of Admission:  10/17/2018     Total days of antibiotics 2               Reason for Consult: MRSA bacteremia    Referring Provider: Tivis Ringer / [place] Primary Care Provider: Isaac Bliss, [name], MD   Assessment/Plan:  Ronald [name] is an 85 y/o male with multiple medical problems including prostate cancer s/p suprapubic cathter admitted with generalized weakness, fever, and chills and found to have MRSA bacteremia. Urine cultures are also positive for Staphylococcus Aureus.   MRSA bacteremia - Source is likely his suprapubic catheter. CT scan without evidence of abscess or lesion around suprapubic catheter. Will likely need to have suprapubic catheter exchanged. Antimicrobial therapy changed to Daptomycin given acute kidney injury superimposed on CKD. Recheck blood cultures in the morning. Obtain TTE. Continue to monitor fever curve and WBC count.   Acute Kidney Injury on CKD - Most recent creatinine of 7.92. Nephrology evaluated about 2 weeks ago with Ronald [name] indicating he would not want dialysis at that time. Continue management per primary team.  Therapeutic Drug Monitoring - Monitor CK levels while on Daptomycin per pharmacy protocol.   Hospice - Recently enrolled in Hospice care. Followed by [facility] with plan for discharge to home with continued Hospice Care.    Active Problems:   HYPERCHOLESTEROLEMIA   HTN (hypertension)   PROSTATE CANCER, HX OF   Urinary tract infection   Urinary tract infection associated with cystostomy catheter (HCC)   Coronary artery disease involving coronary bypass graft of native heart without angina pectoris   History of prostate cancer   Suprapubic catheter ([place])   Debility   AKI (acute kidney injury) ([place])   Chronic combined systolic (congestive) and diastolic (congestive) heart failure (HCC)   PAF (paroxysmal atrial fibrillation) (HCC)   Acute on chronic renal failure (HCC)  Anemia due to chronic kidney disease   Sepsis ([place])   . apixaban  2.5 mg Oral BID  . omeprazole  40 mg Oral Daily  . sodium chloride flush  3 mL Intravenous Once     HPI: Ronald [name] is a 85 y.o. male with multiple medical problems including CAD s/p CABG x 6 (1989) & x 5 (1998), prostate cancer s/p radioactive seed implants at age 83, hypertension, Chronic systolic heart failure, chronic cystitis and CKD admitted from home with fever (up to 101), chills, and generalized body ache. He was started on ciprofloxacin 2 days prior to arrival due to purulent urine. Of note he is on Hospice Care.  Ronald [name] was previously admitted to the hospital with symptomatic anemia and acute renal failure on 09/14/18 with urine cultures positive for MSSA and Stenotrophomonas likely colonized at that time. In the ED he was afebrile with leukocytosis of 30. Urine was turbid, with hemoglobin, protein and leukocytes present. CT imaging of the abdomen and pelvis with limited ability to assess pyelonephritis with inability to use contrast, however unchanged moderate to sever bilateral hydroureteronephrosis was noted. Chest x-ray with atelectasis on the left. Started on broad spectrum meropenem and aztreonam.   Ronald [name] has remained afebrile since admission with no improved WBC count of 21.7 Urine culture reviewed with >100,000 colonies per ml of Staphylococcus aureus. Blood cultures positive for MRSA. Pharmacy has changed his meropenem to Daptomycin given concern for poor renal function.   Ronald [name] is not able to provide a significant amount of history during interview. Feeling bad  with "all of the things that he has going on." Denies feeling fevers, chills, or sweats. No abdominal pain, dysuria, or back pain. Has to have his suprapubic catheter changed more frequently due to problems with it. Unsure of last time it was changed.    Review of Systems: Review of Systems  Constitutional: Positive for malaise/fatigue.  Negative for chills, diaphoresis and fever.  Respiratory: Negative for cough, shortness of breath and wheezing.   Cardiovascular: Negative for chest pain, palpitations and leg swelling.  Gastrointestinal: Negative for abdominal pain, diarrhea, nausea and vomiting.  Genitourinary: Negative for dysuria, flank pain and frequency.     Past Medical History:  Diagnosis Date  . Anemia 09/14/2018  . Arthritis    "joints ache" (01/02/2014)  . At risk for sleep apnea    STOP-BANG= 4    SENT TO PCP 12-23-2013  . Bladder calculi   . Bradycardia   . Chronic cystitis   . Chronic systolic CHF (congestive heart failure) (HCC)    a. EF improved to 50-55% in 2018, previously 35-40%  . CKD (chronic kidney disease), stage II   . Coronary artery disease CARDIOLOGIST-  DR Angelena Form   a. CAD s/p CABG in 1989. b. redo bypass in 1997. c. last cath 2015 - med rx.  . Diverticulosis   . GERD (gastroesophageal reflux disease)   . History of Bell's palsy    RIGHT SIDE-- NO RESIDUAL  . Hx of dizziness   . Hyperlipidemia   . Hypertension    "not anymore" (01/02/2014)  . Ischemic cardiomyopathy   . Kidney stones "years ago"   "passed them"  . Melanoma of ear ([place])    "right"  . Mild dementia ([place])   . Myocardial infarction ([place]) 1986; 1997  . Nocturia   . OAB (overactive bladder)   . Persistent atrial fibrillation   . Prostate cancer ([place]) 1998   S/P  [place]; [place]  . S/P CABG (coronary artery bypass graft)    [place]  . Sepsis due to urinary tract infection ([place])   . Urethral stricture   . UTI (urinary tract infection) 09/2015  . Wears hearing aid    bilateral  . Wears partial dentures     Social History   Tobacco Use  . Smoking status: Former Smoker    Years: 40.00    Types: Cigars    Quit date: 12/24/1983    Years since quitting: 34.8  . Smokeless tobacco: Former Systems developer    Types: Chew    Quit date: 08/28/2013  . Tobacco comment: smoked a pipe   Substance Use Topics  . Alcohol use: No  . Drug use: No    Family History  Problem Relation Age of Onset  . Heart disease Mother   . Diabetes Father   . Heart disease Brother     Allergies  Allergen Reactions  . Lidocaine Shortness Of Breath and Swelling    Mouth swelling   . Pantoprazole Other (See Comments)    Made the patient lightheaded and dizzy  . Nitrofurantoin Hives and Other (See Comments)    Also made the patient lightheaded and dizzy  . Sulfa Antibiotics Hives and Itching  . Tape Other (See Comments)    TAPE WILL TEAR THE SKIN!!!    OBJECTIVE: Blood pressure (!) 113/59, pulse 89, temperature 97.8 F (36.6 C), temperature source Oral, resp. rate 16, height 5\' 4"  (1.626 m), weight 59.5 kg, SpO2  100 %.  Physical Exam Constitutional:      General: He is not in acute distress.    Appearance: He is well-developed.     Comments: Elderly male seated in the chair; labile mood.   Cardiovascular:     Rate and Rhythm: Normal rate and regular rhythm.     Heart sounds: Normal heart sounds.  Pulmonary:     Effort: Pulmonary effort is normal.     Breath sounds: Normal breath sounds.  Abdominal:     Comments: Tenderness and firmness located around suprapubic cathter.   Skin:    General: Skin is warm and dry.  Neurological:     Mental Status: He is alert and oriented to person, place, and time.  Psychiatric:        Behavior: Behavior normal.        Thought Content: Thought content normal.        Judgment: Judgment normal.     Lab Results Lab Results  Component Value Date   WBC 21.7 (H) 10/18/2018   HGB 8.7 (L) 10/18/2018   HCT 25.9 (L) 10/18/2018   MCV 93.8 10/18/2018   PLT 125 (L) 10/18/2018    Lab Results  Component Value Date   CREATININE 7.92 (H) 10/18/2018   BUN 120 (H) 10/18/2018   NA 135 10/18/2018   K 4.7 10/18/2018   CL 99 10/18/2018   CO2 16 (L) 10/18/2018    Lab Results  Component Value Date   ALT 23 10/18/2018   AST 41 10/18/2018    ALKPHOS 142 (H) 10/18/2018   BILITOT 0.6 10/18/2018     Microbiology: Recent Results (from the past 240 hour(s))  Blood culture (routine x 2)     Status: None (Preliminary result)   Collection Time: 10/17/18  5:07 PM   Specimen: BLOOD RIGHT FOREARM  Result Value Ref Range Status   Specimen Description BLOOD RIGHT FOREARM  Final   Special Requests   Final    BOTTLES DRAWN AEROBIC AND ANAEROBIC Blood Culture adequate volume   Culture  Setup Time   Final    GRAM POSITIVE COCCI IN CLUSTERS IN BOTH AEROBIC AND ANAEROBIC BOTTLES CRITICAL RESULT CALLED TO, READ BACK BY AND VERIFIED WITH: J. FRENS,PHARMD 1258 10/18/2018 TRosalia Hammers Performed at [facility], [place] [address], [place], [place] [address]    Culture GRAM POSITIVE COCCI  Final   Report Status PENDING  Incomplete  Blood Culture ID Panel (Reflexed)     Status: Abnormal   Collection Time: 10/17/18  5:07 PM  Result Value Ref Range Status   Enterococcus species NOT DETECTED NOT DETECTED Final   Listeria monocytogenes NOT DETECTED NOT DETECTED Final   Staphylococcus species DETECTED (A) NOT DETECTED Final    Comment: CRITICAL RESULT CALLED TO, READ BACK BY AND VERIFIED WITH: J. FRENS,PHARMD 1258 10/18/2018 T. TYSOR    Staphylococcus aureus (BCID) DETECTED (A) NOT DETECTED Final    Comment: Methicillin (oxacillin)-resistant Staphylococcus aureus (MRSA). MRSA is predictably resistant to beta-lactam antibiotics (except ceftaroline). Preferred therapy is vancomycin unless clinically contraindicated. Patient requires contact precautions if  hospitalized. CRITICAL RESULT CALLED TO, READ BACK BY AND VERIFIED WITH: J. FRENS,PHARMD 1258 10/18/2018 T. TYSOR    Methicillin resistance DETECTED (A) NOT DETECTED Final    Comment: CRITICAL RESULT CALLED TO, READ BACK BY AND VERIFIED WITH: J. FRENS,PHARMD 1258 10/18/2018 T. TYSOR    Streptococcus species NOT DETECTED NOT DETECTED Final   Streptococcus agalactiae NOT DETECTED NOT DETECTED  Final  Streptococcus pneumoniae NOT DETECTED NOT DETECTED Final   Streptococcus pyogenes NOT DETECTED NOT DETECTED Final   Acinetobacter baumannii NOT DETECTED NOT DETECTED Final   Enterobacteriaceae species NOT DETECTED NOT DETECTED Final   Enterobacter cloacae complex NOT DETECTED NOT DETECTED Final   Escherichia coli NOT DETECTED NOT DETECTED Final   Klebsiella oxytoca NOT DETECTED NOT DETECTED Final   Klebsiella pneumoniae NOT DETECTED NOT DETECTED Final   Proteus species NOT DETECTED NOT DETECTED Final   Serratia marcescens NOT DETECTED NOT DETECTED Final   Haemophilus influenzae NOT DETECTED NOT DETECTED Final   Neisseria meningitidis NOT DETECTED NOT DETECTED Final   Pseudomonas aeruginosa NOT DETECTED NOT DETECTED Final   Candida albicans NOT DETECTED NOT DETECTED Final   Candida glabrata NOT DETECTED NOT DETECTED Final   Candida krusei NOT DETECTED NOT DETECTED Final   Candida parapsilosis NOT DETECTED NOT DETECTED Final   Candida tropicalis NOT DETECTED NOT DETECTED Final    Comment: Performed at [facility], [place] [address] [place], [place] [address]  SARS Coronavirus 2 (CEPHEID- Performed in [place] hospital lab), Hosp Order     Status: None   Collection Time: 10/17/18  5:19 PM   Specimen: Nasopharyngeal  Result Value Ref Range Status   SARS Coronavirus 2 NEGATIVE NEGATIVE Final    Comment: (NOTE) If result is NEGATIVE SARS-CoV-2 target nucleic acids are NOT DETECTED. The SARS-CoV-2 RNA is generally detectable in upper and lower  respiratory specimens during the acute phase of infection. The lowest  concentration of SARS-CoV-2 viral copies this assay can detect is 250  copies / mL. A negative result does not preclude SARS-CoV-2 infection  and should not be used as the sole basis for treatment or other  patient management decisions.  A negative result may occur with  improper specimen collection / handling, submission of specimen other  than nasopharyngeal  swab, presence of viral mutation(s) within the  areas targeted by this assay, and inadequate number of viral copies  (<250 copies / mL). A negative result must be combined with clinical  observations, patient history, and epidemiological information. If result is POSITIVE SARS-CoV-2 target nucleic acids are DETECTED. The SARS-CoV-2 RNA is generally detectable in upper and lower  respiratory specimens dur ing the acute phase of infection.  Positive  results are indicative of active infection with SARS-CoV-2.  Clinical  correlation with patient history and other diagnostic information is  necessary to determine patient infection status.  Positive results do  not rule out bacterial infection or co-infection with other viruses. If result is PRESUMPTIVE POSTIVE SARS-CoV-2 nucleic acids MAY BE PRESENT.   A presumptive positive result was obtained on the submitted specimen  and confirmed on repeat testing.  While 2019 novel coronavirus  (SARS-CoV-2) nucleic acids may be present in the submitted sample  additional confirmatory testing may be necessary for epidemiological  and / or clinical management purposes  to differentiate between  SARS-CoV-2 and other Sarbecovirus currently known to infect humans.  If clinically indicated additional testing with an alternate test  methodology [phone]) is advised. The SARS-CoV-2 RNA is generally  detectable in upper and lower respiratory sp ecimens during the acute  phase of infection. The expected result is Negative. Fact Sheet for Patients:  StrictlyIdeas.no Fact Sheet for Healthcare Providers: BankingDealers.co.za This test is not yet approved or cleared by the [place] FDA and has been authorized for detection and/or diagnosis of SARS-CoV-2 by FDA under an Emergency Use Authorization (EUA).  This EUA will  remain in effect (meaning this test can be used) for the duration of the COVID-19 declaration  under Section 564(b)(1) of the Act, 21 U.S.C. section 360bbb-3(b)(1), unless the authorization is terminated or revoked sooner. Performed at [facility], [place] [address], [place], [place] [address]   Urine culture     Status: Abnormal (Preliminary result)   Collection Time: 10/17/18  5:21 PM   Specimen: Urine, Random  Result Value Ref Range Status   Specimen Description URINE, RANDOM  Final   Special Requests   Final    NONE Performed at [facility], [place] [address] [place], [place] [address]    Culture >=100,000 COLONIES/mL STAPHYLOCOCCUS AUREUS (A)  Final   Report Status PENDING  Incomplete  Blood culture (routine x 2)     Status: None (Preliminary result)   Collection Time: 10/17/18  5:23 PM   Specimen: BLOOD  Result Value Ref Range Status   Specimen Description BLOOD LEFT ANTECUBITAL  Final   Special Requests   Final    BOTTLES DRAWN AEROBIC AND ANAEROBIC Blood Culture results may not be optimal due to an inadequate volume of blood received in culture bottles   Culture  Setup Time   Final    GRAM POSITIVE COCCI IN CLUSTERS IN BOTH AEROBIC AND ANAEROBIC BOTTLES CRITICAL RESULT CALLED TO, READ BACK BY AND VERIFIED WITH: J. FRENS,PHARMD 1258 10/18/2018 TRosalia Hammers Performed at [facility], [place] [address] [place], [place] [address]    Culture [facility] POSITIVE COCCI  Final   Report Status PENDING  Incomplete     Terri Piedra, NP [place] [facility] [phone] Pager  10/18/2018  2:10 PM

## 2018-10-19 ENCOUNTER — Ambulatory Visit: Payer: Self-pay | Admitting: Internal Medicine

## 2018-10-19 ENCOUNTER — Inpatient Hospital Stay (HOSPITAL_COMMUNITY)

## 2018-10-19 DIAGNOSIS — I351 Nonrheumatic aortic (valve) insufficiency: Secondary | ICD-10-CM

## 2018-10-19 DIAGNOSIS — N19 Unspecified kidney failure: Secondary | ICD-10-CM

## 2018-10-19 DIAGNOSIS — B9561 Methicillin susceptible Staphylococcus aureus infection as the cause of diseases classified elsewhere: Secondary | ICD-10-CM

## 2018-10-19 DIAGNOSIS — I361 Nonrheumatic tricuspid (valve) insufficiency: Secondary | ICD-10-CM

## 2018-10-19 LAB — CBC
HCT: 27.1 % — ABNORMAL LOW (ref 39.0–52.0)
Hemoglobin: 9 g/dL — ABNORMAL LOW (ref 13.0–17.0)
MCH: 31.5 pg (ref 26.0–34.0)
MCHC: 33.2 g/dL (ref 30.0–36.0)
MCV: 94.8 fL (ref 80.0–100.0)
Platelets: 122 10*3/uL — ABNORMAL LOW (ref 150–400)
RBC: 2.86 MIL/uL — ABNORMAL LOW (ref 4.22–5.81)
RDW: 15.5 % (ref 11.5–15.5)
WBC: 18.5 10*3/uL — ABNORMAL HIGH (ref 4.0–10.5)
nRBC: 0 % (ref 0.0–0.2)

## 2018-10-19 LAB — BASIC METABOLIC PANEL
Anion gap: 15 (ref 5–15)
BUN: 117 mg/dL — ABNORMAL HIGH (ref 8–23)
CO2: 16 mmol/L — ABNORMAL LOW (ref 22–32)
Calcium: 8.4 mg/dL — ABNORMAL LOW (ref 8.9–10.3)
Chloride: 107 mmol/L (ref 98–111)
Creatinine, Ser: 6.99 mg/dL — ABNORMAL HIGH (ref 0.61–1.24)
GFR calc Af Amer: 7 mL/min — ABNORMAL LOW (ref >60)
GFR calc non Af Amer: 6 mL/min — ABNORMAL LOW (ref >60)
Glucose, Bld: 130 mg/dL — ABNORMAL HIGH (ref 70–99)
Potassium: 4.1 mmol/L (ref 3.5–5.1)
Sodium: 138 mmol/L (ref 135–145)

## 2018-10-19 LAB — URINE CULTURE: Culture: >=100000 — AB

## 2018-10-19 LAB — ECHOCARDIOGRAM LIMITED
Height: 64 in
Weight: 2328.06 oz

## 2018-10-19 MED ORDER — POLYETHYLENE GLYCOL 3350 17 G PO PACK
17.0000 g | PACK | Freq: Every day | ORAL | Status: DC
  Administered 2018-10-19 – 2018-10-20 (×2): 17 g via ORAL
  Filled 2018-10-19 (×2): qty 1

## 2018-10-19 NOTE — Progress Notes (Signed)
[facility] Infectious [facility]   Reason for visit: Follow up on Bacteremia  Interval History: urine culture with MSSA, blood cultures MRSA on BCID, culture sensitivities pending.  No complaints.  Getting TTE now.  No associated diarrhea.     Physical Exam: Constitutional:  Vitals:   10/19/18 0603 10/19/18 0950  BP: (!) 111/54 (!) 103/53  Pulse: 72 73  Resp:    Temp: 98.5 F (36.9 C) 97.6 F (36.4 C)  SpO2: 98% 96%   patient appears in NAD Eyes: anicteric GI: soft, nt, nd MS: no edema Skin: no rashes  Review of Systems: Constitutional: negative for fevers, chills and anorexia Gastrointestinal: negative for nausea  Lab Results  Component Value Date   WBC 18.5 (H) 10/19/2018   HGB 9.0 (L) 10/19/2018   HCT 27.1 (L) 10/19/2018   MCV 94.8 10/19/2018   PLT 122 (L) 10/19/2018    Lab Results  Component Value Date   CREATININE 6.99 (H) 10/19/2018   BUN 117 (H) 10/19/2018   NA 138 10/19/2018   K 4.1 10/19/2018   CL 107 10/19/2018   CO2 16 (L) 10/19/2018    Lab Results  Component Value Date   ALT 23 10/18/2018   AST 41 10/18/2018   ALKPHOS 142 (H) 10/18/2018     Microbiology: Recent Results (from the past 240 hour(s))  Blood culture (routine x 2)     Status: None (Preliminary result)   Collection Time: 10/17/18  5:07 PM   Specimen: BLOOD RIGHT FOREARM  Result Value Ref Range Status   Specimen Description BLOOD RIGHT FOREARM  Final   Special Requests   Final    BOTTLES DRAWN AEROBIC AND ANAEROBIC Blood Culture adequate volume   Culture  Setup Time   Final    GRAM POSITIVE COCCI IN CLUSTERS IN BOTH AEROBIC AND ANAEROBIC BOTTLES CRITICAL RESULT CALLED TO, READ BACK BY AND VERIFIED WITH: J. FRENS,PHARMD 1258 10/18/2018 TRosalia Hammers Performed at [facility], [place] [address] [place], [place] [address]    Culture GRAM POSITIVE COCCI  Final   Report Status PENDING  Incomplete  Blood Culture ID Panel (Reflexed)     Status: Abnormal   Collection Time:  10/17/18  5:07 PM  Result Value Ref Range Status   Enterococcus species NOT DETECTED NOT DETECTED Final   Listeria monocytogenes NOT DETECTED NOT DETECTED Final   Staphylococcus species DETECTED (A) NOT DETECTED Final    Comment: CRITICAL RESULT CALLED TO, READ BACK BY AND VERIFIED WITH: J. FRENS,PHARMD 1258 10/18/2018 T. TYSOR    Staphylococcus aureus (BCID) DETECTED (A) NOT DETECTED Final    Comment: Methicillin (oxacillin)-resistant Staphylococcus aureus (MRSA). MRSA is predictably resistant to beta-lactam antibiotics (except ceftaroline). Preferred therapy is vancomycin unless clinically contraindicated. Patient requires contact precautions if  hospitalized. CRITICAL RESULT CALLED TO, READ BACK BY AND VERIFIED WITH: J. FRENS,PHARMD 1258 10/18/2018 T. TYSOR    Methicillin resistance DETECTED (A) NOT DETECTED Final    Comment: CRITICAL RESULT CALLED TO, READ BACK BY AND VERIFIED WITH: J. FRENS,PHARMD 1258 10/18/2018 T. TYSOR    Streptococcus species NOT DETECTED NOT DETECTED Final   Streptococcus agalactiae NOT DETECTED NOT DETECTED Final   Streptococcus pneumoniae NOT DETECTED NOT DETECTED Final   Streptococcus pyogenes NOT DETECTED NOT DETECTED Final   Acinetobacter baumannii NOT DETECTED NOT DETECTED Final   Enterobacteriaceae species NOT DETECTED NOT DETECTED Final   Enterobacter cloacae complex NOT DETECTED NOT DETECTED Final   Escherichia coli NOT DETECTED NOT DETECTED Final  Klebsiella oxytoca NOT DETECTED NOT DETECTED Final   Klebsiella pneumoniae NOT DETECTED NOT DETECTED Final   Proteus species NOT DETECTED NOT DETECTED Final   Serratia marcescens NOT DETECTED NOT DETECTED Final   Haemophilus influenzae NOT DETECTED NOT DETECTED Final   Neisseria meningitidis NOT DETECTED NOT DETECTED Final   Pseudomonas aeruginosa NOT DETECTED NOT DETECTED Final   Candida albicans NOT DETECTED NOT DETECTED Final   Candida glabrata NOT DETECTED NOT DETECTED Final   Candida krusei NOT  DETECTED NOT DETECTED Final   Candida parapsilosis NOT DETECTED NOT DETECTED Final   Candida tropicalis NOT DETECTED NOT DETECTED Final    Comment: Performed at [facility], [place] [address], [place], [place] [address]  SARS Coronavirus 2 (CEPHEID- Performed in [facility] hospital lab), Hosp Order     Status: None   Collection Time: 10/17/18  5:19 PM   Specimen: Nasopharyngeal  Result Value Ref Range Status   SARS Coronavirus 2 NEGATIVE NEGATIVE Final    Comment: (NOTE) If result is NEGATIVE SARS-CoV-2 target nucleic acids are NOT DETECTED. The SARS-CoV-2 RNA is generally detectable in upper and lower  respiratory specimens during the acute phase of infection. The lowest  concentration of SARS-CoV-2 viral copies this assay can detect is 250  copies / mL. A negative result does not preclude SARS-CoV-2 infection  and should not be used as the sole basis for treatment or other  patient management decisions.  A negative result may occur with  improper specimen collection / handling, submission of specimen other  than nasopharyngeal swab, presence of viral mutation(s) within the  areas targeted by this assay, and inadequate number of viral copies  (<250 copies / mL). A negative result must be combined with clinical  observations, patient history, and epidemiological information. If result is POSITIVE SARS-CoV-2 target nucleic acids are DETECTED. The SARS-CoV-2 RNA is generally detectable in upper and lower  respiratory specimens dur ing the acute phase of infection.  Positive  results are indicative of active infection with SARS-CoV-2.  Clinical  correlation with patient history and other diagnostic information is  necessary to determine patient infection status.  Positive results do  not rule out bacterial infection or co-infection with other viruses. If result is PRESUMPTIVE POSTIVE SARS-CoV-2 nucleic acids MAY BE PRESENT.   A presumptive positive result was obtained on the  submitted specimen  and confirmed on repeat testing.  While 2019 novel coronavirus  (SARS-CoV-2) nucleic acids may be present in the submitted sample  additional confirmatory testing may be necessary for epidemiological  and / or clinical management purposes  to differentiate between  SARS-CoV-2 and other Sarbecovirus currently known to infect humans.  If clinically indicated additional testing with an alternate test  methodology [phone]) is advised. The SARS-CoV-2 RNA is generally  detectable in upper and lower respiratory sp ecimens during the acute  phase of infection. The expected result is Negative. Fact Sheet for Patients:  StrictlyIdeas.no Fact Sheet for Healthcare Providers: BankingDealers.co.za This test is not yet approved or cleared by the [place] FDA and has been authorized for detection and/or diagnosis of SARS-CoV-2 by FDA under an Emergency Use Authorization (EUA).  This EUA will remain in effect (meaning this test can be used) for the duration of the COVID-19 declaration under Section 564(b)(1) of the Act, 21 U.S.C. section 360bbb-3(b)(1), unless the authorization is terminated or revoked sooner. Performed at [facility], [place] [address] [place], [place] [address]   Urine culture  Status: Abnormal   Collection Time: 10/17/18  5:21 PM   Specimen: Urine, Random  Result Value Ref Range Status   Specimen Description URINE, RANDOM  Final   Special Requests   Final    NONE Performed at [facility], [place] [address] [place], [place] [address]    Culture >=100,000 COLONIES/mL STAPHYLOCOCCUS AUREUS (A)  Final   Report Status 10/19/2018 FINAL  Final   Organism ID, Bacteria STAPHYLOCOCCUS AUREUS (A)  Final      Susceptibility   Staphylococcus aureus - MIC*    CIPROFLOXACIN 4 RESISTANT Resistant     GENTAMICIN <=0.5 SENSITIVE Sensitive     NITROFURANTOIN <=16 SENSITIVE Sensitive     OXACILLIN 0.5  SENSITIVE Sensitive     TETRACYCLINE <=1 SENSITIVE Sensitive     VANCOMYCIN <=0.5 SENSITIVE Sensitive     TRIMETH/SULFA <=10 SENSITIVE Sensitive     CLINDAMYCIN <=0.25 SENSITIVE Sensitive     RIFAMPIN <=0.5 SENSITIVE Sensitive     Inducible Clindamycin NEGATIVE Sensitive     * >=100,000 COLONIES/mL STAPHYLOCOCCUS AUREUS  Blood culture (routine x 2)     Status: None (Preliminary result)   Collection Time: 10/17/18  5:23 PM   Specimen: BLOOD  Result Value Ref Range Status   Specimen Description BLOOD LEFT ANTECUBITAL  Final   Special Requests   Final    BOTTLES DRAWN AEROBIC AND ANAEROBIC Blood Culture results may not be optimal due to an inadequate volume of blood received in culture bottles   Culture  Setup Time   Final    GRAM POSITIVE COCCI IN CLUSTERS IN BOTH AEROBIC AND ANAEROBIC BOTTLES CRITICAL RESULT CALLED TO, READ BACK BY AND VERIFIED WITH: J. FRENS,PHARMD 1258 10/18/2018 TRosalia Hammers Performed at [facility], [place] [address] [place], [place] [address]    Culture GRAM POSITIVE COCCI  Final   Report Status PENDING  Incomplete  Culture, blood (routine x 2)     Status: None (Preliminary result)   Collection Time: 10/19/18  4:44 AM   Specimen: BLOOD  Result Value Ref Range Status   Specimen Description BLOOD LEFT ARM  Final   Special Requests   Final    BOTTLES DRAWN AEROBIC ONLY Blood Culture adequate volume   Culture   Final    NO GROWTH < 12 HOURS Performed at [facility], [place] [address] [place], [place] [address]    Report Status PENDING  Incomplete  Culture, blood (routine x 2)     Status: None (Preliminary result)   Collection Time: 10/19/18  4:44 AM   Specimen: BLOOD  Result Value Ref Range Status   Specimen Description BLOOD LEFT ARM  Final   Special Requests   Final    BOTTLES DRAWN AEROBIC ONLY Blood Culture adequate volume   Culture   Final    NO GROWTH < 12 HOURS Performed at [facility], [place] [address] [place], [place] [address]     Report Status PENDING  Incomplete    Impression/Plan:  1. Staph bacteremia - MRSA on BCID though urine with MSSA.  May be discordant blood cultures as well.  On daptomycin due to renal failure.  TTE done but no report yet.  Repeat cultures sent.    2.  Renal failure - creat around 7, BUN over 100.  On daptomycin instead of vancomycin to avoid worsening with antibitics.  3.  Access - will need a picc line if repeat blood cultures remain negative at least 72  hours.     4. Hospice - he is being evaluated by hospice but at this time remains a full code   5.  dispo - Not sure if he will be able to do antibiotics at home.  May need placement.

## 2018-10-19 NOTE — Progress Notes (Signed)
[facility] 6E 30 - [facility] ([facility]) GIP RN Note  This is a related and covered GIP admission of 10/17/2018 with [facility] diagnosis of ESRD with Heart Failure per Dr. Konrad Dolores of [facility].  Patient is a Full Code. Family notified [facility] on call service that patient wife was going to activate EMS for transport to Hospital Emergency Room for evaluation of pain, fever and extreme SOB. Patient has been admitted to hospital for possible UTI/Sepsis.   Unable to visit patient due to COVID restrictions, so called bedside RN regarding patient condition. [facility] Bedside RN stated "Pt more alert/oriented/spunky today and seems to be feeling better than yesterday with no noted SOB/pain and no PRNs administered this shift. Bedside RN also mentioned that the MD may order a PMT Consult to clarify GOC to determine treatment pathway". After several attempts, unable to reach patient by telephone in room so called wife [name] at home to support and update on patient. Wife states she misses patient and looking forward to having him home soon.    Per Epic Notes:  MD (Hospitalist) Plan: address Sepsis with IV Antibiotics, obtain Blood/Urine Cx, HTN - evaluate on telemetry unit.  Pharmacy consult:  recommended a change in IV Antibiotic to Daptomycin - first dose adm at 1608 10/18/2018.   OT/PT Consult: recommend therapy with support of SNF after bedside evaluation on 10/18/2018.  Infectious Disease Consult Note: Positive for MRSA Bacteremia and Staphylococcus Aureus in Urine Cx.  Likely not a good TEE candidate.  Will therefore need a prolonged IV course of daptomycin. Will repeat blood cultures, Will need a picc line (if not a dialysis candidate) and home IV antibiotics if aggressive treatment desired.  V/S: 97.6, 103/53, 69, with 96% sats on RA I&O: Total Output: 3950 - Total Input:  460 Abnormal Labs 10/19/18 :CO2 16, Glucose 130, BUN 117, Creat 6.99, Cal 8.4, Anion Gap 15, GFR 6, WBC 18.5, RBC 2.86, Hemo 9.0, HCT 27.1, Plate 122.    Medications:Eliquis 2.5mg  tab BID,Prilosec 40mg  cap QD, Daptomycin 500mg  in NS IVPB Q48hr.  No continuous IVFs at this time.  No PRNs administered in last 24 hours.    Communication with IDG: Updated [facility] team. Completed Transfer Summary and Medication List faxed to Unit Adm to place on shadow chart.   Communication with PCG: Spoke to wife to support and update on patient. Made aware that [facility] will follow patient daily during hospitalization.  Goals of Care: Continuing to evaluate as patient is recently admitted to Hospice for symptom Management/EOL care with support of [facility] in the home. [facility] SW noted during update this morning  that she has had ongoing discussions with pt wife about GOC/Full Code Status and wife has been hesistant to make EOL decisions since admission to [facility] on 10/07/18.    Discharge Planning: Per Epic - Plan was to discharge pt when medically stable. MD consulted OT/PT which are recommending therapy at SNF which is inconsistent with Hospice so [place] will needs to be addressed before discharge. Attempted to address Code status with wife during conversation this morning but interrupted by another call.  Will continue to address EOL GOC during conversations with spouse.  If pt should need transportation at discharge, please contact [facility] for transport of [facility] patients when ambulance is needed at [phone].  Gar Ponto, RN [facility] Liaison (in [place]) [phone]

## 2018-10-19 NOTE — Progress Notes (Signed)
PROGRESS NOTE    TEAGEN MCLEARY  MHD:[id number] DOB: [birth year] DOA: 10/17/2018 PCP: Isaac Bliss, [name], MD   Brief Narrative: Ronald [name] is a 85 y.o. medical history significant of CAD s/p CABG in 1989 and redo bypass in 1997, HTN, HLD, mild dementia, prostate cancer and atrial fibrillation,history of kidney stones and urethral stricture  sp suprapubic catheter, GERD. Patient presented with a sepsis picture and found to have MRSA bacteremia from MRSA complicated/recurrent UTI   Assessment & Plan:   Active Problems:   HYPERCHOLESTEROLEMIA   HTN (hypertension)   PROSTATE CANCER, HX OF   Urinary tract infection   Urinary tract infection associated with cystostomy catheter (HCC)   Coronary artery disease involving coronary bypass graft of native heart without angina pectoris   History of prostate cancer   Suprapubic catheter ([place])   Debility   AKI (acute kidney injury) ([place])   Chronic combined systolic (congestive) and diastolic (congestive) heart failure (HCC)   PAF (paroxysmal atrial fibrillation) (HCC)   Acute on chronic renal failure (HCC)   Anemia due to chronic kidney disease   Sepsis ([place])   MRSA bacteremia   Sepsis Present on admission. Secondary to bacteremia  MRSA Bacteremia Blood culture ID suggests MRSA while urine culture significant for MSSA. Started on Daptomycin. ID consulted. -Transthoracic Echocardiogram pending -ID recommendations: Daptomycin  Essential hypertension Soft blood pressures in the ED. Holding antihypertensives for now.  Hyperlipidemia Statin held on admission  Recurrent UTI CAUTI History of multiple UTIs In setting of chronic suprapubic catheter. Currently, urine is positive for MSSA. -Management above  Paroxysmal atrial fibrillation -Continue Eliquis  CAD S/p CABG. Stable.  AKI on CKD Possibly secondary to hydronephrosis. Discussed with urology who recommended catheter exchange. Another option is percutaneous  nephrostomy tubes, however, this may not be in line with GOC -Daily BMP  Chronic combined systolic and diastolic heart failure Currently euvolemic on exam. -Continue to hold Imdur in setting of sepsis and bacteremia.  Goals of care Patient is currently enrolled in hospice care. Per chart review, he was previously made a DNR during recent palliative care discussion. Discussed with patient who was too distracted to give me useful information. Discussed with the patient's wife who confirms reversal of DNR to full code at this time -Palliative care consulted -Discussed with patient's PCP  Anemia of chronic kidney disease Stable.   DVT prophylaxis: Eliquis Code Status:   Code Status: Full Code Family Communication: None Disposition Plan: Discharge in several days   Consultants:   Infectious disease  Procedures:   None  Antimicrobials:  Aztreonam  Daptomycin    Subjective: No concerns per patient today  Objective: Vitals:   10/19/18 0603 10/19/18 0950 10/19/18 1153 10/19/18 1324  BP: (!) 111/54 (!) 103/53 127/83 122/63  Pulse: 72 73  76  Resp:    20  Temp: 98.5 F (36.9 C) 97.6 F (36.4 C)  98.1 F (36.7 C)  TempSrc: Oral Oral  Oral  SpO2: 98% 96%  98%  Weight: 66 kg     Height:        Intake/Output Summary (Last 24 hours) at 10/19/2018 1337 Last data filed at 10/19/2018 0954 Gross per 24 hour  Intake 100 ml  Output 2700 ml  Net -2600 ml   Filed Weights   10/17/18 2327 10/18/18 0545 10/19/18 0603  Weight: 66 kg 59.5 kg 66 kg    Examination:  General exam: Appears calm and comfortable Respiratory system: Clear to auscultation. Respiratory effort normal. Cardiovascular  system: S1 & S2 heard, RRR. No murmurs, rubs, gallops or clicks. Gastrointestinal system: Abdomen is nondistended, soft and nontender. No organomegaly or masses felt. Normal bowel sounds heard. Central nervous system: Alert and oriented. No focal neurological deficits. Extremities: No  edema. No calf tenderness Skin: No cyanosis. No rashes Psychiatry: Judgement and insight appear normal. Mood & affect appropriate.     Data Reviewed: I have personally reviewed following labs and imaging studies  CBC: Recent Labs  Lab 10/17/18 1554 10/17/18 1707 10/17/18 1732 10/18/18 0320 10/19/18 0444  WBC 30.0* 27.1*  --  21.7* 18.5*  NEUTROABS  --  25.7*  --   --   --   HGB 8.6* 9.0* 8.5* 8.7* 9.0*  HCT 26.6* 27.0* 25.0* 25.9* 27.1*  MCV 97.1 95.4  --  93.8 94.8  PLT 133* 131*  --  125* 062*   Basic Metabolic Panel: Recent Labs  Lab 10/17/18 1554 10/17/18 1732 10/18/18 0320 10/19/18 0444  NA 131* 132* 135 138  K 4.5 4.3 4.7 4.1  CL 100  --  99 107  CO2 15*  --  16* 16*  GLUCOSE 148*  --  146* 130*  BUN 114*  --  120* 117*  CREATININE 8.26*  --  7.92* 6.99*  CALCIUM 8.5*  --  8.6* 8.4*  MG  --   --  2.1  --   PHOS  --   --  7.6*  --    GFR: Estimated Creatinine Clearance: 6 mL/min (A) (by C-G formula based on SCr of 6.99 mg/dL (H)). Liver Function Tests: Recent Labs  Lab 10/18/18 0320  AST 41  ALT 23  ALKPHOS 142*  BILITOT 0.6  PROT 5.5*  ALBUMIN 2.8*   Recent Labs  Lab 10/17/18 1855  LIPASE 23   No results for input(s): AMMONIA in the last 168 hours. Coagulation Profile: No results for input(s): INR, PROTIME in the last 168 hours. Cardiac Enzymes: Recent Labs  Lab 10/18/18 0320  CKTOTAL 79   BNP (last 3 results) Recent Labs    09/13/18 1503  PROBNP 10,359*   HbA1C: No results for input(s): HGBA1C in the last 72 hours. CBG: Recent Labs  Lab 10/17/18 1704  GLUCAP 109*   Lipid Profile: No results for input(s): CHOL, HDL, LDLCALC, TRIG, CHOLHDL, LDLDIRECT in the last 72 hours. Thyroid Function Tests: Recent Labs    10/18/18 0320  TSH 3.625   Anemia Panel: Recent Labs    10/18/18 0320  VITAMINB12 1,477*  FOLATE 17.7  FERRITIN 703*  TIBC 199*  IRON 20*  RETICCTPCT 1.6   Sepsis Labs: Recent Labs  Lab 10/17/18 1729  10/18/18 0320  PROCALCITON  --  47.49  LATICACIDVEN 1.8  --     Recent Results (from the past 240 hour(s))  Blood culture (routine x 2)     Status: Abnormal (Preliminary result)   Collection Time: 10/17/18  5:07 PM   Specimen: BLOOD RIGHT FOREARM  Result Value Ref Range Status   Specimen Description BLOOD RIGHT FOREARM  Final   Special Requests   Final    BOTTLES DRAWN AEROBIC AND ANAEROBIC Blood Culture adequate volume   Culture  Setup Time   Final    GRAM POSITIVE COCCI IN CLUSTERS IN BOTH AEROBIC AND ANAEROBIC BOTTLES CRITICAL RESULT CALLED TO, READ BACK BY AND VERIFIED WITH: J. FRENS,PHARMD 1258 10/18/2018 T. TYSOR    Culture (A)  Final    STAPHYLOCOCCUS AUREUS SUSCEPTIBILITIES TO FOLLOW Performed at [facility], [address] [place], [place] [address]    Report Status PENDING  Incomplete  Blood Culture ID Panel (Reflexed)     Status: Abnormal   Collection Time: 10/17/18  5:07 PM  Result Value Ref Range Status   Enterococcus species NOT DETECTED NOT DETECTED Final   Listeria monocytogenes NOT DETECTED NOT DETECTED Final   Staphylococcus species DETECTED (A) NOT DETECTED Final    Comment: CRITICAL RESULT CALLED TO, READ BACK BY AND VERIFIED WITH: J. FRENS,PHARMD 1258 10/18/2018 T. TYSOR    Staphylococcus aureus (BCID) DETECTED (A) NOT DETECTED Final    Comment: Methicillin (oxacillin)-resistant Staphylococcus aureus (MRSA). MRSA is predictably resistant to beta-lactam antibiotics (except ceftaroline). Preferred therapy is vancomycin unless clinically contraindicated. Patient requires contact precautions if  hospitalized. CRITICAL RESULT CALLED TO, READ BACK BY AND VERIFIED WITH: J. FRENS,PHARMD 1258 10/18/2018 T. TYSOR    Methicillin resistance DETECTED (A) NOT DETECTED Final    Comment: CRITICAL RESULT CALLED TO, READ BACK BY AND VERIFIED WITH: J. FRENS,PHARMD 1258 10/18/2018 T. TYSOR    Streptococcus species NOT DETECTED NOT DETECTED Final   Streptococcus  agalactiae NOT DETECTED NOT DETECTED Final   Streptococcus pneumoniae NOT DETECTED NOT DETECTED Final   Streptococcus pyogenes NOT DETECTED NOT DETECTED Final   Acinetobacter baumannii NOT DETECTED NOT DETECTED Final   Enterobacteriaceae species NOT DETECTED NOT DETECTED Final   Enterobacter cloacae complex NOT DETECTED NOT DETECTED Final   Escherichia coli NOT DETECTED NOT DETECTED Final   Klebsiella oxytoca NOT DETECTED NOT DETECTED Final   Klebsiella pneumoniae NOT DETECTED NOT DETECTED Final   Proteus species NOT DETECTED NOT DETECTED Final   Serratia marcescens NOT DETECTED NOT DETECTED Final   Haemophilus influenzae NOT DETECTED NOT DETECTED Final   Neisseria meningitidis NOT DETECTED NOT DETECTED Final   Pseudomonas aeruginosa NOT DETECTED NOT DETECTED Final   Candida albicans NOT DETECTED NOT DETECTED Final   Candida glabrata NOT DETECTED NOT DETECTED Final   Candida krusei NOT DETECTED NOT DETECTED Final   Candida parapsilosis NOT DETECTED NOT DETECTED Final   Candida tropicalis NOT DETECTED NOT DETECTED Final    Comment: Performed at [facility], [place] [address], [place], [place] [address]  SARS Coronavirus 2 (CEPHEID- Performed in [place] [facility] lab), Hosp Order     Status: None   Collection Time: 10/17/18  5:19 PM   Specimen: Nasopharyngeal  Result Value Ref Range Status   SARS Coronavirus 2 NEGATIVE NEGATIVE Final    Comment: (NOTE) If result is NEGATIVE SARS-CoV-2 target nucleic acids are NOT DETECTED. The SARS-CoV-2 RNA is generally detectable in upper and lower  respiratory specimens during the acute phase of infection. The lowest  concentration of SARS-CoV-2 viral copies this assay can detect is 250  copies / mL. A negative result does not preclude SARS-CoV-2 infection  and should not be used as the sole basis for treatment or other  patient management decisions.  A negative result may occur with  improper specimen collection / handling, submission of  specimen other  than nasopharyngeal swab, presence of viral mutation(s) within the  areas targeted by this assay, and inadequate number of viral copies  (<250 copies / mL). A negative result must be combined with clinical  observations, patient history, and epidemiological information. If result is POSITIVE SARS-CoV-2 target nucleic acids are DETECTED. The SARS-CoV-2 RNA is generally detectable in upper and lower  respiratory specimens dur ing the acute phase of infection.  Positive  results are indicative of active infection with  SARS-CoV-2.  Clinical  correlation with patient history and other diagnostic information is  necessary to determine patient infection status.  Positive results do  not rule out bacterial infection or co-infection with other viruses. If result is PRESUMPTIVE POSTIVE SARS-CoV-2 nucleic acids MAY BE PRESENT.   A presumptive positive result was obtained on the submitted specimen  and confirmed on repeat testing.  While 2019 novel coronavirus  (SARS-CoV-2) nucleic acids may be present in the submitted sample  additional confirmatory testing may be necessary for epidemiological  and / or clinical management purposes  to differentiate between  SARS-CoV-2 and other Sarbecovirus currently known to infect humans.  If clinically indicated additional testing with an alternate test  methodology [phone]) is advised. The SARS-CoV-2 RNA is generally  detectable in upper and lower respiratory sp ecimens during the acute  phase of infection. The expected result is Negative. Fact Sheet for Patients:  StrictlyIdeas.no Fact Sheet for Healthcare Providers: BankingDealers.co.za This test is not yet approved or cleared by the [place] FDA and has been authorized for detection and/or diagnosis of SARS-CoV-2 by FDA under an Emergency Use Authorization (EUA).  This EUA will remain in effect (meaning this test can be used) for the  duration of the COVID-19 declaration under Section 564(b)(1) of the Act, 21 U.S.C. section 360bbb-3(b)(1), unless the authorization is terminated or revoked sooner. Performed at [facility], [place] [address], [place], [place] [address]   Urine culture     Status: Abnormal   Collection Time: 10/17/18  5:21 PM   Specimen: Urine, Random  Result Value Ref Range Status   Specimen Description URINE, RANDOM  Final   Special Requests   Final    NONE Performed at [facility], [place] [address] [place], [place] [address]    Culture >=100,000 COLONIES/mL STAPHYLOCOCCUS AUREUS (A)  Final   Report Status 10/19/2018 FINAL  Final   Organism ID, Bacteria STAPHYLOCOCCUS AUREUS (A)  Final      Susceptibility   Staphylococcus aureus - MIC*    CIPROFLOXACIN 4 RESISTANT Resistant     GENTAMICIN <=0.5 SENSITIVE Sensitive     NITROFURANTOIN <=16 SENSITIVE Sensitive     OXACILLIN 0.5 SENSITIVE Sensitive     TETRACYCLINE <=1 SENSITIVE Sensitive     VANCOMYCIN <=0.5 SENSITIVE Sensitive     TRIMETH/SULFA <=10 SENSITIVE Sensitive     CLINDAMYCIN <=0.25 SENSITIVE Sensitive     RIFAMPIN <=0.5 SENSITIVE Sensitive     Inducible Clindamycin NEGATIVE Sensitive     * >=100,000 COLONIES/mL STAPHYLOCOCCUS AUREUS  Blood culture (routine x 2)     Status: Abnormal (Preliminary result)   Collection Time: 10/17/18  5:23 PM   Specimen: BLOOD  Result Value Ref Range Status   Specimen Description BLOOD LEFT ANTECUBITAL  Final   Special Requests   Final    BOTTLES DRAWN AEROBIC AND ANAEROBIC Blood Culture results may not be optimal due to an inadequate volume of blood received in culture bottles   Culture  Setup Time   Final    GRAM POSITIVE COCCI IN CLUSTERS IN BOTH AEROBIC AND ANAEROBIC BOTTLES CRITICAL RESULT CALLED TO, READ BACK BY AND VERIFIED WITH: J. FRENS,PHARMD 1258 10/18/2018 TRosalia Hammers Performed at [facility], [place] [address] [place],  [address]    Culture STAPHYLOCOCCUS AUREUS (A)   Final   Report Status PENDING  Incomplete  Culture, blood (routine x 2)     Status: None (Preliminary result)   Collection Time: 10/19/18  4:44  AM   Specimen: BLOOD  Result Value Ref Range Status   Specimen Description BLOOD LEFT ARM  Final   Special Requests   Final    BOTTLES DRAWN AEROBIC ONLY Blood Culture adequate volume   Culture   Final    NO GROWTH < 12 HOURS Performed at [facility], [address], [place], [place] [address]    Report Status PENDING  Incomplete  Culture, blood (routine x 2)     Status: None (Preliminary result)   Collection Time: 10/19/18  4:44 AM   Specimen: BLOOD  Result Value Ref Range Status   Specimen Description BLOOD LEFT ARM  Final   Special Requests   Final    BOTTLES DRAWN AEROBIC ONLY Blood Culture adequate volume   Culture   Final    NO GROWTH < 12 HOURS Performed at [facility], [place] [address] [place], [place] [address]    Report Status PENDING  Incomplete         Radiology Studies: Ct Abdomen Pelvis Wo Contrast  Result Date: 10/17/2018 CLINICAL DATA:  Fever, body aches, and cystitis. Evaluate for pyelonephritis. EXAM: CT ABDOMEN AND PELVIS WITHOUT CONTRAST TECHNIQUE: Multidetector CT imaging of the abdomen and pelvis was performed following the standard protocol without IV contrast. COMPARISON:  Renal ultrasound dated October 04, 2018. CT abdomen pelvis dated September 15, 2018. FINDINGS: Lower chest: New trace left pleural effusion. Unchanged cardiomegaly and bibasilar scarring. Hepatobiliary: No focal liver abnormality is seen. Status post cholecystectomy. No biliary dilatation. Pancreas: Atrophic. No ductal dilatation or surrounding inflammatory changes. Spleen: Normal in size without focal abnormality. Adrenals/Urinary Tract: The adrenal glands are unremarkable. Unchanged moderate to severe bilateral hydroureteronephrosis. Unchanged 5.8 cm right renal simple cyst. The bladder remains decompressed by a suprapubic catheter.  Stomach/Bowel: Stomach is within normal limits. Appendix appears normal. No evidence of bowel wall thickening, distention, or inflammatory changes. Unchanged sigmoid diverticulosis. Vascular/Lymphatic: Aortic atherosclerosis. No enlarged abdominal or pelvic lymph nodes. Reproductive: Prostatic radiation seed implants again noted. Other: Unchanged small fat containing bilateral inguinal hernias. No free fluid or pneumoperitoneum. Musculoskeletal: No acute or significant osseous findings. IMPRESSION: 1. Evaluation for pyelonephritis is limited without intravenous contrast. Unchanged moderate to severe bilateral hydroureteronephrosis. 2. New trace left pleural effusion. 3.  Aortic atherosclerosis (ICD10-I70.0). Electronically Signed   By: Titus Dubin M.D.   On: 10/17/2018 19:37   Dg Chest Portable 1 View  Result Date: 10/17/2018 CLINICAL DATA:  Fever today, shortness of breath and body aches for 3 months, history hypertension, coronary artery disease post MI, CHF EXAM: PORTABLE CHEST 1 VIEW COMPARISON:  Portable exam 1659 hours compared to 10/04/2018 FINDINGS: Enlargement of cardiac silhouette post CABG. Mediastinal contours and pulmonary vascularity normal. Mild chronic elevation of LEFT diaphragm with mild LEFT basilar subsegmental atelectasis. Upper lungs clear. No pleural effusion or pneumothorax. Bones demineralized. IMPRESSION: Subsegmental atelectasis LEFT base. Enlargement of cardiac silhouette post CABG. Electronically Signed   By: Lavonia Dana M.D.   On: 10/17/2018 17:33        Scheduled Meds:  apixaban  2.5 mg Oral BID   omeprazole  40 mg Oral Daily   polyethylene glycol  17 g Oral Daily   sodium chloride flush  3 mL Intravenous Once   Continuous Infusions:  DAPTOmycin (CUBICIN)  IV 500 mg (10/18/18 1608)     LOS: 1 day     Cordelia Poche, MD [facility] 10/19/2018, 1:37 PM  If 7PM-7AM, please contact night-coverage [email or web address]

## 2018-10-20 LAB — CBC
HCT: 26.3 % — ABNORMAL LOW (ref 39.0–52.0)
Hemoglobin: 8.9 g/dL — ABNORMAL LOW (ref 13.0–17.0)
MCH: 31 pg (ref 26.0–34.0)
MCHC: 33.8 g/dL (ref 30.0–36.0)
MCV: 91.6 fL (ref 80.0–100.0)
Platelets: 139 10*3/uL — ABNORMAL LOW (ref 150–400)
RBC: 2.87 MIL/uL — ABNORMAL LOW (ref 4.22–5.81)
RDW: 15.6 % — ABNORMAL HIGH (ref 11.5–15.5)
WBC: 16.2 10*3/uL — ABNORMAL HIGH (ref 4.0–10.5)
nRBC: 0 % (ref 0.0–0.2)

## 2018-10-20 LAB — BASIC METABOLIC PANEL
Anion gap: 15 (ref 5–15)
BUN: 112 mg/dL — ABNORMAL HIGH (ref 8–23)
CO2: 14 mmol/L — ABNORMAL LOW (ref 22–32)
Calcium: 8.3 mg/dL — ABNORMAL LOW (ref 8.9–10.3)
Chloride: 107 mmol/L (ref 98–111)
Creatinine, Ser: 6.13 mg/dL — ABNORMAL HIGH (ref 0.61–1.24)
GFR calc Af Amer: 9 mL/min — ABNORMAL LOW (ref >60)
GFR calc non Af Amer: 7 mL/min — ABNORMAL LOW (ref >60)
Glucose, Bld: 126 mg/dL — ABNORMAL HIGH (ref 70–99)
Potassium: 3.3 mmol/L — ABNORMAL LOW (ref 3.5–5.1)
Sodium: 136 mmol/L (ref 135–145)

## 2018-10-20 LAB — CULTURE, BLOOD (ROUTINE X 2): Special Requests: ADEQUATE

## 2018-10-20 MED ORDER — CEFAZOLIN SODIUM-DEXTROSE 1-4 GM/50ML-% IV SOLN
1.0000 g | INTRAVENOUS | Status: DC
  Administered 2018-10-20 – 2018-10-25 (×6): 1 g via INTRAVENOUS
  Filled 2018-10-20 (×6): qty 50

## 2018-10-20 MED ORDER — POTASSIUM CHLORIDE CRYS ER 20 MEQ PO TBCR
20.0000 meq | EXTENDED_RELEASE_TABLET | Freq: Once | ORAL | Status: AC
  Administered 2018-10-20: 20 meq via ORAL
  Filled 2018-10-20: qty 1

## 2018-10-20 MED ORDER — SODIUM BICARBONATE 650 MG PO TABS
650.0000 mg | ORAL_TABLET | Freq: Three times a day (TID) | ORAL | Status: DC
  Administered 2018-10-20 – 2018-10-25 (×16): 650 mg via ORAL
  Filled 2018-10-20 (×16): qty 1

## 2018-10-20 NOTE — Progress Notes (Signed)
[facility]   Reason for visit: Follow up on Bacteremia  Interval History: urine culture with MSSA, blood cultures also MSSA.  Only BCID is MRSA.  No new events.  Wife deciding on picc line and home IV antibiotics.     Physical Exam: Constitutional:  Vitals:   10/20/18 0800 10/20/18 0920  BP:  (!) 142/77  Pulse:  74  Resp: 14 14  Temp:  97.6 F (36.4 C)  SpO2:  100%   patient appears in NAD Eyes: anicteric GI: soft, nt, nd MS: no edema Skin: no rashes  Review of Systems: Constitutional: negative for fevers, chills and anorexia Gastrointestinal: negative for nausea  Lab Results  Component Value Date   WBC 16.2 (H) 10/20/2018   HGB 8.9 (L) 10/20/2018   HCT 26.3 (L) 10/20/2018   MCV 91.6 10/20/2018   PLT 139 (L) 10/20/2018    Lab Results  Component Value Date   CREATININE 6.13 (H) 10/20/2018   BUN 112 (H) 10/20/2018   NA 136 10/20/2018   K 3.3 (L) 10/20/2018   CL 107 10/20/2018   CO2 14 (L) 10/20/2018    Lab Results  Component Value Date   ALT 23 10/18/2018   AST 41 10/18/2018   ALKPHOS 142 (H) 10/18/2018     Microbiology: Recent Results (from the past 240 hour(s))  Blood culture (routine x 2)     Status: Abnormal   Collection Time: 10/17/18  5:07 PM   Specimen: BLOOD RIGHT FOREARM  Result Value Ref Range Status   Specimen Description BLOOD RIGHT FOREARM  Final   Special Requests   Final    BOTTLES DRAWN AEROBIC AND ANAEROBIC Blood Culture adequate volume   Culture  Setup Time   Final    GRAM POSITIVE COCCI IN CLUSTERS IN BOTH AEROBIC AND ANAEROBIC BOTTLES CRITICAL RESULT CALLED TO, READ BACK BY AND VERIFIED WITH: J. FRENS,PHARMD 1258 10/18/2018 TRosalia Hammers Performed at [facility], [place] [address] [place], [place] [address]    Culture STAPHYLOCOCCUS AUREUS (A)  Final   Report Status 10/20/2018 FINAL  Final   Organism ID, Bacteria STAPHYLOCOCCUS AUREUS  Final      Susceptibility   Staphylococcus aureus - MIC*   CIPROFLOXACIN >=8 RESISTANT Resistant     ERYTHROMYCIN >=8 RESISTANT Resistant     GENTAMICIN <=0.5 SENSITIVE Sensitive     OXACILLIN 0.5 SENSITIVE Sensitive     TETRACYCLINE <=1 SENSITIVE Sensitive     VANCOMYCIN <=0.5 SENSITIVE Sensitive     TRIMETH/SULFA <=10 SENSITIVE Sensitive     CLINDAMYCIN <=0.25 SENSITIVE Sensitive     RIFAMPIN <=0.5 SENSITIVE Sensitive     Inducible Clindamycin NEGATIVE Sensitive     * STAPHYLOCOCCUS AUREUS  Blood Culture ID Panel (Reflexed)     Status: Abnormal   Collection Time: 10/17/18  5:07 PM  Result Value Ref Range Status   Enterococcus species NOT DETECTED NOT DETECTED Final   Listeria monocytogenes NOT DETECTED NOT DETECTED Final   Staphylococcus species DETECTED (A) NOT DETECTED Final    Comment: CRITICAL RESULT CALLED TO, READ BACK BY AND VERIFIED WITH: J. FRENS,PHARMD 1258 10/18/2018 T. TYSOR    Staphylococcus aureus (BCID) DETECTED (A) NOT DETECTED Final    Comment: Methicillin (oxacillin)-resistant Staphylococcus aureus (MRSA). MRSA is predictably resistant to beta-lactam antibiotics (except ceftaroline). Preferred therapy is vancomycin unless clinically contraindicated. Patient requires contact precautions if  hospitalized. CRITICAL RESULT CALLED TO, READ BACK BY AND VERIFIED WITH: J. FRENS,PHARMD 1258 10/18/2018 T.  TYSOR    Methicillin resistance DETECTED (A) NOT DETECTED Final    Comment: CRITICAL RESULT CALLED TO, READ BACK BY AND VERIFIED WITH: J. FRENS,PHARMD 1258 10/18/2018 T. TYSOR    Streptococcus species NOT DETECTED NOT DETECTED Final   Streptococcus agalactiae NOT DETECTED NOT DETECTED Final   Streptococcus pneumoniae NOT DETECTED NOT DETECTED Final   Streptococcus pyogenes NOT DETECTED NOT DETECTED Final   Acinetobacter baumannii NOT DETECTED NOT DETECTED Final   Enterobacteriaceae species NOT DETECTED NOT DETECTED Final   Enterobacter cloacae complex NOT DETECTED NOT DETECTED Final   Escherichia coli NOT DETECTED NOT DETECTED  Final   Klebsiella oxytoca NOT DETECTED NOT DETECTED Final   Klebsiella pneumoniae NOT DETECTED NOT DETECTED Final   Proteus species NOT DETECTED NOT DETECTED Final   Serratia marcescens NOT DETECTED NOT DETECTED Final   Haemophilus influenzae NOT DETECTED NOT DETECTED Final   Neisseria meningitidis NOT DETECTED NOT DETECTED Final   Pseudomonas aeruginosa NOT DETECTED NOT DETECTED Final   Candida albicans NOT DETECTED NOT DETECTED Final   Candida glabrata NOT DETECTED NOT DETECTED Final   Candida krusei NOT DETECTED NOT DETECTED Final   Candida parapsilosis NOT DETECTED NOT DETECTED Final   Candida tropicalis NOT DETECTED NOT DETECTED Final    Comment: Performed at [facility], [place] [address] [place], [place] [address]  SARS Coronavirus 2 (CEPHEID- Performed in [facility]), Hosp Order     Status: None   Collection Time: 10/17/18  5:19 PM   Specimen: Nasopharyngeal  Result Value Ref Range Status   SARS Coronavirus 2 NEGATIVE NEGATIVE Final    Comment: (NOTE) If result is NEGATIVE SARS-CoV-2 target nucleic acids are NOT DETECTED. The SARS-CoV-2 RNA is generally detectable in upper and lower  respiratory specimens during the acute phase of infection. The lowest  concentration of SARS-CoV-2 viral copies this assay can detect is 250  copies / mL. A negative result does not preclude SARS-CoV-2 infection  and should not be used as the sole basis for treatment or other  patient management decisions.  A negative result may occur with  improper specimen collection / handling, submission of specimen other  than nasopharyngeal swab, presence of viral mutation(s) within the  areas targeted by this assay, and inadequate number of viral copies  (<250 copies / mL). A negative result must be combined with clinical  observations, patient history, and epidemiological information. If result is POSITIVE SARS-CoV-2 target nucleic acids are DETECTED. The SARS-CoV-2 RNA is generally  detectable in upper and lower  respiratory specimens dur ing the acute phase of infection.  Positive  results are indicative of active infection with SARS-CoV-2.  Clinical  correlation with patient history and other diagnostic information is  necessary to determine patient infection status.  Positive results do  not rule out bacterial infection or co-infection with other viruses. If result is PRESUMPTIVE POSTIVE SARS-CoV-2 nucleic acids MAY BE PRESENT.   A presumptive positive result was obtained on the submitted specimen  and confirmed on repeat testing.  While 2019 novel coronavirus  (SARS-CoV-2) nucleic acids may be present in the submitted sample  additional confirmatory testing may be necessary for epidemiological  and / or clinical management purposes  to differentiate between  SARS-CoV-2 and other Sarbecovirus currently known to infect humans.  If clinically indicated additional testing with an alternate test  methodology [phone] is advised. The SARS-CoV-2 RNA is generally  detectable in upper and lower respiratory sp ecimens during the acute  phase of  infection. The expected result is Negative. Fact Sheet for Patients:  StrictlyIdeas.no Fact Sheet for Healthcare Providers: BankingDealers.co.za This test is not yet approved or cleared by the [place] FDA and has been authorized for detection and/or diagnosis of SARS-CoV-2 by FDA under an Emergency Use Authorization (EUA).  This EUA will remain in effect (meaning this test can be used) for the duration of the COVID-19 declaration under Section 564(b)(1) of the Act, 21 U.S.C. section 360bbb-3(b)(1), unless the authorization is terminated or revoked sooner. Performed at [facility], [place] [address] [place], [place] [address]   Urine culture     Status: Abnormal   Collection Time: 10/17/18  5:21 PM   Specimen: Urine, Random  Result Value Ref Range Status   Specimen  Description URINE, RANDOM  Final   Special Requests   Final    NONE Performed at [facility], [place] [address] [place], [place] [address]    Culture >=100,000 COLONIES/mL STAPHYLOCOCCUS AUREUS (A)  Final   Report Status 10/19/2018 FINAL  Final   Organism ID, Bacteria STAPHYLOCOCCUS AUREUS (A)  Final      Susceptibility   Staphylococcus aureus - MIC*    CIPROFLOXACIN 4 RESISTANT Resistant     GENTAMICIN <=0.5 SENSITIVE Sensitive     NITROFURANTOIN <=16 SENSITIVE Sensitive     OXACILLIN 0.5 SENSITIVE Sensitive     TETRACYCLINE <=1 SENSITIVE Sensitive     VANCOMYCIN <=0.5 SENSITIVE Sensitive     TRIMETH/SULFA <=10 SENSITIVE Sensitive     CLINDAMYCIN <=0.25 SENSITIVE Sensitive     RIFAMPIN <=0.5 SENSITIVE Sensitive     Inducible Clindamycin NEGATIVE Sensitive     * >=100,000 COLONIES/mL STAPHYLOCOCCUS AUREUS  Blood culture (routine x 2)     Status: Abnormal   Collection Time: 10/17/18  5:23 PM   Specimen: BLOOD  Result Value Ref Range Status   Specimen Description BLOOD LEFT ANTECUBITAL  Final   Special Requests   Final    BOTTLES DRAWN AEROBIC AND ANAEROBIC Blood Culture results may not be optimal due to an inadequate volume of blood received in culture bottles   Culture  Setup Time   Final    GRAM POSITIVE COCCI IN CLUSTERS IN BOTH AEROBIC AND ANAEROBIC BOTTLES CRITICAL RESULT CALLED TO, READ BACK BY AND VERIFIED WITH: J. FRENS,PHARMD 1258 10/18/2018 T. TYSOR    Culture (A)  Final    STAPHYLOCOCCUS AUREUS SUSCEPTIBILITIES PERFORMED ON PREVIOUS CULTURE WITHIN THE LAST 5 DAYS. Performed at [facility], [place] [address], [place], [place] [address]    Report Status 10/20/2018 FINAL  Final  Culture, blood (routine x 2)     Status: None (Preliminary result)   Collection Time: 10/19/18  4:44 AM   Specimen: BLOOD  Result Value Ref Range Status   Specimen Description BLOOD LEFT ARM  Final   Special Requests   Final    BOTTLES DRAWN AEROBIC ONLY Blood Culture adequate  volume   Culture   Final    NO GROWTH 1 DAY Performed at [facility], [place] [address], [place], [place] [address]    Report Status PENDING  Incomplete  Culture, blood (routine x 2)     Status: None (Preliminary result)   Collection Time: 10/19/18  4:44 AM   Specimen: BLOOD  Result Value Ref Range Status   Specimen Description BLOOD LEFT ARM  Final   Special Requests   Final    BOTTLES DRAWN AEROBIC ONLY Blood Culture adequate volume  Culture   Final    NO GROWTH 1 DAY Performed at [facility], [place] [address] [place], [place] [address]    Report Status PENDING  Incomplete    Impression/Plan:  1. Staph bacteremia - All cultures actually MSSA despite MRSA in BCID.  Therefore I consider this MSSA. I will change to cefazolin   2.  Renal failure - creat around 7, BUN over 100.  On daptomycin instead of vancomycin to avoid worsening with antibitics.  3.  Access - will need a picc line if repeat blood cultures remain negative at least 72 hours (Monday).     4.  IP - MSSA in blood cultures so will d/c contact isolation.

## 2018-10-20 NOTE — Progress Notes (Signed)
PROGRESS NOTE    KOAL ESLINGER  NAT:[id number] DOB: 05/13/1927 DOA: 10/17/2018 PCP: Isaac Bliss, [name], MD   Brief Narrative: Ronald [name] is a 85 y.o. medical history significant of CAD s/p CABG in 1989 and redo bypass in 1997, HTN, HLD, mild dementia, prostate cancer and atrial fibrillation,history of kidney stones and urethral stricture  sp suprapubic catheter, GERD. Patient presented with a sepsis picture and found to have MRSA bacteremia from MRSA complicated/recurrent UTI   Assessment & Plan:   Active Problems:   HYPERCHOLESTEROLEMIA   HTN (hypertension)   PROSTATE CANCER, HX OF   Urinary tract infection   Urinary tract infection associated with cystostomy catheter (HCC)   Coronary artery disease involving coronary bypass graft of native heart without angina pectoris   History of prostate cancer   Suprapubic catheter ([place])   Debility   AKI (acute kidney injury) ([place])   Chronic combined systolic (congestive) and diastolic (congestive) heart failure (HCC)   PAF (paroxysmal atrial fibrillation) (HCC)   Acute on chronic renal failure (HCC)   Anemia due to chronic kidney disease   Sepsis ([place])   MRSA bacteremia   Sepsis Present on admission. Secondary to bacteremia  MSSA Bacteremia Blood culture ID suggests MRSA while urine culture significant for MSSA. Blood culture significant for MSSA bacteremia Started on Daptomycin. ID consulted. Transthoracic Echocardiogram obtained without mention of vegetation. -ID recommendations: Daptomycin -Discussed PICC with wife. She plans on discussing with family prior to agreeing to PICC, although, she is inclined to want PICC line for antibiotic treatment  Essential hypertension Soft blood pressures initially. Holding antihypertensives for now. May need to restart as BP improves  Hyperlipidemia Statin held on admission  Recurrent UTI CAUTI History of multiple UTIs In setting of chronic suprapubic catheter. Currently,  urine is positive for MSSA. Catheter exchanged on 7/24 -Management above  Paroxysmal atrial fibrillation -Continue Eliquis  Anion gap metabolic acidosis Secondary to kidney injury. Acidosis worsening -Sodium bicarb tablets  CAD S/p CABG. Stable.  AKI on CKD Possibly secondary to hydronephrosis. Discussed with urology who recommended catheter exchange. Another option is percutaneous nephrostomy tubes, however, this may not be in line with GOC -Daily BMP  Chronic combined systolic and diastolic heart failure Currently euvolemic on exam. -Continue to hold Imdur in setting of sepsis and bacteremia.  Goals of care Patient is currently enrolled in hospice care. Per chart review, he was previously made a DNR during recent palliative care discussion. Discussed with patient who was too distracted to give me useful information. Discussed with the patient's wife who confirms reversal of DNR to full code at this time -Palliative care consulted -Discussed with patient's PCP  Anemia of chronic kidney disease Stable.   DVT prophylaxis: Eliquis Code Status:   Code Status: Full Code Family Communication: Wife on telephone Disposition Plan: Discharge in several days   Consultants:   Infectious disease  Procedures:   Transthoracic Echocardiogram (10/19/2018) IMPRESSIONS    1. The left ventricle has moderate-severely reduced systolic function, with an ejection fraction of 30-35%. The cavity size was normal. There is moderate concentric left ventricular hypertrophy. Left ventricular diastolic function could not be  evaluated. Left ventricular diffuse hypokinesis.  2. The right ventricle has normal systolc function. The cavity was normal. There is no increase in right ventricular wall thickness. Right ventricular systolic pressure is normal with an estimated pressure of 27.6 mmHg.  3. Left atrial size was moderately dilated.  4. Right atrial size was mildly dilated.  5. Mild  thickening  of the aortic valve Mild calcification of the aortic valve. Aortic valve regurgitation is mild by color flow Doppler.  6. Pulmonic valve regurgitation was not assessed by color flow Doppler.   Antimicrobials:  Aztreonam  Daptomycin    Subjective: No issues today.  Objective: Vitals:   10/20/18 0004 10/20/18 0550 10/20/18 0800 10/20/18 0920  BP: 131/64 121/65  (!) 142/77  Pulse: 74 74  74  Resp: 14  14 14   Temp: 98 F (36.7 C) 98.4 F (36.9 C)  97.6 F (36.4 C)  TempSrc: Oral Oral  Oral  SpO2: 100% 98%  100%  Weight:  66.8 kg    Height:        Intake/Output Summary (Last 24 hours) at 10/20/2018 1128 Last data filed at 10/20/2018 0549 Gross per 24 hour  Intake 490 ml  Output 1400 ml  Net -910 ml   Filed Weights   10/18/18 0545 10/19/18 0603 10/20/18 0550  Weight: 59.5 kg 66 kg 66.8 kg    Examination:  General exam: Appears calm and comfortable Respiratory system: Clear to auscultation. Respiratory effort normal. Cardiovascular system: S1 & S2 heard, RRR. No murmurs, rubs, gallops or clicks. Gastrointestinal system: Abdomen is nondistended, soft and nontender. No organomegaly or masses felt. Normal bowel sounds heard. Central nervous system: Alert and oriented to person, place and year. No focal neurological deficits. Extremities: No edema. No calf tenderness Skin: No cyanosis. No rashes Psychiatry: Mood & affect appropriate.     Data Reviewed: I have personally reviewed following labs and imaging studies  CBC: Recent Labs  Lab 10/17/18 1554 10/17/18 1707 10/17/18 1732 10/18/18 0320 10/19/18 0444 10/20/18 0352  WBC 30.0* 27.1*  --  21.7* 18.5* 16.2*  NEUTROABS  --  25.7*  --   --   --   --   HGB 8.6* 9.0* 8.5* 8.7* 9.0* 8.9*  HCT 26.6* 27.0* 25.0* 25.9* 27.1* 26.3*  MCV 97.1 95.4  --  93.8 94.8 91.6  PLT 133* 131*  --  125* 122* 601*   Basic Metabolic Panel: Recent Labs  Lab 10/17/18 1554 10/17/18 1732 10/18/18 0320 10/19/18 0444 10/20/18  0352  NA 131* 132* 135 138 136  K 4.5 4.3 4.7 4.1 3.3*  CL 100  --  99 107 107  CO2 15*  --  16* 16* 14*  GLUCOSE 148*  --  146* 130* 126*  BUN 114*  --  120* 117* 112*  CREATININE 8.26*  --  7.92* 6.99* 6.13*  CALCIUM 8.5*  --  8.6* 8.4* 8.3*  MG  --   --  2.1  --   --   PHOS  --   --  7.6*  --   --    GFR: Estimated Creatinine Clearance: 6.8 mL/min (A) (by C-G formula based on SCr of 6.13 mg/dL (H)). Liver Function Tests: Recent Labs  Lab 10/18/18 0320  AST 41  ALT 23  ALKPHOS 142*  BILITOT 0.6  PROT 5.5*  ALBUMIN 2.8*   Recent Labs  Lab 10/17/18 1855  LIPASE 23   No results for input(s): AMMONIA in the last 168 hours. Coagulation Profile: No results for input(s): INR, PROTIME in the last 168 hours. Cardiac Enzymes: Recent Labs  Lab 10/18/18 0320  CKTOTAL 79   BNP (last 3 results) Recent Labs    09/13/18 1503  PROBNP 10,359*   HbA1C: No results for input(s): HGBA1C in the last 72 hours. CBG: Recent Labs  Lab 10/17/18 1704  GLUCAP 109*  Lipid Profile: No results for input(s): CHOL, HDL, LDLCALC, TRIG, CHOLHDL, LDLDIRECT in the last 72 hours. Thyroid Function Tests: Recent Labs    10/18/18 0320  TSH 3.625   Anemia Panel: Recent Labs    10/18/18 0320  VITAMINB12 1,477*  FOLATE 17.7  FERRITIN 703*  TIBC 199*  IRON 20*  RETICCTPCT 1.6   Sepsis Labs: Recent Labs  Lab 10/17/18 1729 10/18/18 0320  PROCALCITON  --  47.49  LATICACIDVEN 1.8  --     Recent Results (from the past 240 hour(s))  Blood culture (routine x 2)     Status: Abnormal   Collection Time: 10/17/18  5:07 PM   Specimen: BLOOD RIGHT FOREARM  Result Value Ref Range Status   Specimen Description BLOOD RIGHT FOREARM  Final   Special Requests   Final    BOTTLES DRAWN AEROBIC AND ANAEROBIC Blood Culture adequate volume   Culture  Setup Time   Final    GRAM POSITIVE COCCI IN CLUSTERS IN BOTH AEROBIC AND ANAEROBIC BOTTLES CRITICAL RESULT CALLED TO, READ BACK BY AND  VERIFIED WITH: J. FRENS,PHARMD 1258 10/18/2018 TRosalia Hammers Performed at [facility], [place] [address] [place], [place] [address]    Culture STAPHYLOCOCCUS AUREUS (A)  Final   Report Status 10/20/2018 FINAL  Final   Organism ID, Bacteria STAPHYLOCOCCUS AUREUS  Final      Susceptibility   Staphylococcus aureus - MIC*    CIPROFLOXACIN >=8 RESISTANT Resistant     ERYTHROMYCIN >=8 RESISTANT Resistant     GENTAMICIN <=0.5 SENSITIVE Sensitive     OXACILLIN 0.5 SENSITIVE Sensitive     TETRACYCLINE <=1 SENSITIVE Sensitive     VANCOMYCIN <=0.5 SENSITIVE Sensitive     TRIMETH/SULFA <=10 SENSITIVE Sensitive     CLINDAMYCIN <=0.25 SENSITIVE Sensitive     RIFAMPIN <=0.5 SENSITIVE Sensitive     Inducible Clindamycin NEGATIVE Sensitive     * STAPHYLOCOCCUS AUREUS  Blood Culture ID Panel (Reflexed)     Status: Abnormal   Collection Time: 10/17/18  5:07 PM  Result Value Ref Range Status   Enterococcus species NOT DETECTED NOT DETECTED Final   Listeria monocytogenes NOT DETECTED NOT DETECTED Final   Staphylococcus species DETECTED (A) NOT DETECTED Final    Comment: CRITICAL RESULT CALLED TO, READ BACK BY AND VERIFIED WITH: J. FRENS,PHARMD 1258 10/18/2018 T. TYSOR    Staphylococcus aureus (BCID) DETECTED (A) NOT DETECTED Final    Comment: Methicillin (oxacillin)-resistant Staphylococcus aureus (MRSA). MRSA is predictably resistant to beta-lactam antibiotics (except ceftaroline). Preferred therapy is vancomycin unless clinically contraindicated. Patient requires contact precautions if  hospitalized. CRITICAL RESULT CALLED TO, READ BACK BY AND VERIFIED WITH: J. FRENS,PHARMD 1258 10/18/2018 T. TYSOR    Methicillin resistance DETECTED (A) NOT DETECTED Final    Comment: CRITICAL RESULT CALLED TO, READ BACK BY AND VERIFIED WITH: J. FRENS,PHARMD 1258 10/18/2018 T. TYSOR    Streptococcus species NOT DETECTED NOT DETECTED Final   Streptococcus agalactiae NOT DETECTED NOT DETECTED Final   Streptococcus  pneumoniae NOT DETECTED NOT DETECTED Final   Streptococcus pyogenes NOT DETECTED NOT DETECTED Final   Acinetobacter baumannii NOT DETECTED NOT DETECTED Final   Enterobacteriaceae species NOT DETECTED NOT DETECTED Final   Enterobacter cloacae complex NOT DETECTED NOT DETECTED Final   Escherichia coli NOT DETECTED NOT DETECTED Final   Klebsiella oxytoca NOT DETECTED NOT DETECTED Final   Klebsiella pneumoniae NOT DETECTED NOT DETECTED Final   Proteus species NOT DETECTED NOT DETECTED Final   Serratia marcescens NOT DETECTED  NOT DETECTED Final   Haemophilus influenzae NOT DETECTED NOT DETECTED Final   Neisseria meningitidis NOT DETECTED NOT DETECTED Final   Pseudomonas aeruginosa NOT DETECTED NOT DETECTED Final   Candida albicans NOT DETECTED NOT DETECTED Final   Candida glabrata NOT DETECTED NOT DETECTED Final   Candida krusei NOT DETECTED NOT DETECTED Final   Candida parapsilosis NOT DETECTED NOT DETECTED Final   Candida tropicalis NOT DETECTED NOT DETECTED Final    Comment: Performed at [facility], [place] [address], [place], [place] [address]  SARS Coronavirus 2 (CEPHEID- Performed in [facility]), Hosp Order     Status: None   Collection Time: 10/17/18  5:19 PM   Specimen: Nasopharyngeal  Result Value Ref Range Status   SARS Coronavirus 2 NEGATIVE NEGATIVE Final    Comment: (NOTE) If result is NEGATIVE SARS-CoV-2 target nucleic acids are NOT DETECTED. The SARS-CoV-2 RNA is generally detectable in upper and lower  respiratory specimens during the acute phase of infection. The lowest  concentration of SARS-CoV-2 viral copies this assay can detect is 250  copies / mL. A negative result does not preclude SARS-CoV-2 infection  and should not be used as the sole basis for treatment or other  patient management decisions.  A negative result may occur with  improper specimen collection / handling, submission of specimen other  than nasopharyngeal swab, presence of viral  mutation(s) within the  areas targeted by this assay, and inadequate number of viral copies  (<250 copies / mL). A negative result must be combined with clinical  observations, patient history, and epidemiological information. If result is POSITIVE SARS-CoV-2 target nucleic acids are DETECTED. The SARS-CoV-2 RNA is generally detectable in upper and lower  respiratory specimens dur ing the acute phase of infection.  Positive  results are indicative of active infection with SARS-CoV-2.  Clinical  correlation with patient history and other diagnostic information is  necessary to determine patient infection status.  Positive results do  not rule out bacterial infection or co-infection with other viruses. If result is PRESUMPTIVE POSTIVE SARS-CoV-2 nucleic acids MAY BE PRESENT.   A presumptive positive result was obtained on the submitted specimen  and confirmed on repeat testing.  While 2019 novel coronavirus  (SARS-CoV-2) nucleic acids may be present in the submitted sample  additional confirmatory testing may be necessary for epidemiological  and / or clinical management purposes  to differentiate between  SARS-CoV-2 and other Sarbecovirus currently known to infect humans.  If clinically indicated additional testing with an alternate test  methodology [phone]) is advised. The SARS-CoV-2 RNA is generally  detectable in upper and lower respiratory sp ecimens during the acute  phase of infection. The expected result is Negative. Fact Sheet for Patients:  StrictlyIdeas.no Fact Sheet for Healthcare Providers: BankingDealers.co.za This test is not yet approved or cleared by the [place] FDA and has been authorized for detection and/or diagnosis of SARS-CoV-2 by FDA under an Emergency Use Authorization (EUA).  This EUA will remain in effect (meaning this test can be used) for the duration of the COVID-19 declaration under Section 564(b)(1)  of the Act, 21 U.S.C. section 360bbb-3(b)(1), unless the authorization is terminated or revoked sooner. Performed at [facility], [place] [address], [place], [place] [address]   Urine culture     Status: Abnormal   Collection Time: 10/17/18  5:21 PM   Specimen: Urine, Random  Result Value Ref Range Status   Specimen Description URINE, RANDOM  Final  Special Requests   Final    NONE Performed at [facility], [place] [address], [place], [place] [address]    Culture >=100,000 COLONIES/mL STAPHYLOCOCCUS AUREUS (A)  Final   Report Status 10/19/2018 FINAL  Final   Organism ID, Bacteria STAPHYLOCOCCUS AUREUS (A)  Final      Susceptibility   Staphylococcus aureus - MIC*    CIPROFLOXACIN 4 RESISTANT Resistant     GENTAMICIN <=0.5 SENSITIVE Sensitive     NITROFURANTOIN <=16 SENSITIVE Sensitive     OXACILLIN 0.5 SENSITIVE Sensitive     TETRACYCLINE <=1 SENSITIVE Sensitive     VANCOMYCIN <=0.5 SENSITIVE Sensitive     TRIMETH/SULFA <=10 SENSITIVE Sensitive     CLINDAMYCIN <=0.25 SENSITIVE Sensitive     RIFAMPIN <=0.5 SENSITIVE Sensitive     Inducible Clindamycin NEGATIVE Sensitive     * >=100,000 COLONIES/mL STAPHYLOCOCCUS AUREUS  Blood culture (routine x 2)     Status: Abnormal   Collection Time: 10/17/18  5:23 PM   Specimen: BLOOD  Result Value Ref Range Status   Specimen Description BLOOD LEFT ANTECUBITAL  Final   Special Requests   Final    BOTTLES DRAWN AEROBIC AND ANAEROBIC Blood Culture results may not be optimal due to an inadequate volume of blood received in culture bottles   Culture  Setup Time   Final    GRAM POSITIVE COCCI IN CLUSTERS IN BOTH AEROBIC AND ANAEROBIC BOTTLES CRITICAL RESULT CALLED TO, READ BACK BY AND VERIFIED WITH: J. FRENS,PHARMD 1258 10/18/2018 T. TYSOR    Culture (A)  Final    STAPHYLOCOCCUS AUREUS SUSCEPTIBILITIES PERFORMED ON PREVIOUS CULTURE WITHIN THE LAST 5 DAYS. Performed at [facility], [place] [address] [place], [place] [address]     Report Status 10/20/2018 FINAL  Final  Culture, blood (routine x 2)     Status: None (Preliminary result)   Collection Time: 10/19/18  4:44 AM   Specimen: BLOOD  Result Value Ref Range Status   Specimen Description BLOOD LEFT ARM  Final   Special Requests   Final    BOTTLES DRAWN AEROBIC ONLY Blood Culture adequate volume   Culture   Final    NO GROWTH 1 DAY Performed at [facility], [place] [address], [place], [place] [address]    Report Status PENDING  Incomplete  Culture, blood (routine x 2)     Status: None (Preliminary result)   Collection Time: 10/19/18  4:44 AM   Specimen: BLOOD  Result Value Ref Range Status   Specimen Description BLOOD LEFT ARM  Final   Special Requests   Final    BOTTLES DRAWN AEROBIC ONLY Blood Culture adequate volume   Culture   Final    NO GROWTH 1 DAY Performed at [facility], [place] [address] [place], [place] [address]    Report Status PENDING  Incomplete         Radiology Studies: No results found.      Scheduled Meds: . apixaban  2.5 mg Oral BID  . omeprazole  40 mg Oral Daily  . polyethylene glycol  17 g Oral Daily  . sodium bicarbonate  650 mg Oral TID  . sodium chloride flush  3 mL Intravenous Once   Continuous Infusions: . DAPTOmycin (CUBICIN)  IV 500 mg (10/18/18 1608)     LOS: 2 days     Cordelia Poche, MD [facility] 10/20/2018, 11:28 AM  If 7PM-7AM, please contact night-coverage [email or web address]

## 2018-10-20 NOTE — Progress Notes (Signed)
[facility] 6E 30 - [facility] (ACC) GIP RN Note  This is a related and covered GIP admission of 10/17/2018 with [facility] diagnosis of ESRD with Heart Failure per Dr. Konrad Dolores of [facility]. Patient is a Full Code. Family notified [facility] on call service that patient wife was going to activate EMS for transport to Hospital Emergency Room for evaluation of pain, fever and extreme SOB. Patient has been admitted to hospital for possible UTI/Sepsis.   Unable to visit patient due to COVID restrictions, so called bedside RN regarding patient condition. RN reports patient is improving, VS stable. PMT consult has been ordered to assist with [place]. Patient is HOH and did not answer the phone when called into the room. Spoke with wife who states she spoke with patient yesterday and the physician today.    VS: 98.0 , 153/85, 73, 16, 100% RA I/O: 490/3400  Abn labs: K+ 3.3, CO2 14, Glucose 126, BUN 112, Creatinine 6.13, Calcium 8.3, GFR 7, WBC 16.2, RBC 2.87, HCT 26.3, Hgb 8.9,  RDW 15.6, Platelets 139 Urine and blood culture show MSSA, with only BCID showing MRSA.  Per MD notes: Infectious disease-- Impression/Plan:  1. Staph bacteremia - All cultures actually MSSA despite MRSA in BCID.  Therefore I consider this MSSA. I will change to cefazolin  2.  Renal failure - creat around 7, BUN over 100.  On daptomycin instead of vancomycin to avoid worsening with antibitics. 3.  Access - will need a picc line if repeat blood cultures remain negative at least 72 hours (Monday).    4.  IP - MSSA in blood cultures so will d/c contact isolation.  Per attending: Sepsis Present on admission. Secondary to bacteremia MSSA Bacteremia Blood culture ID suggests MRSA while urine culture significant for MSSA. Blood culture significant for MSSA bacteremia Started on Daptomycin. ID consulted. Transthoracic Echocardiogram obtained without mention of vegetation. -ID recommendations: Daptomycin -Discussed PICC with wife. She plans on  discussing with family prior to agreeing to PICC, although, she is inclined to want PICC line for antibiotic treatment Essential hypertension Soft blood pressures initially. Holding antihypertensives for now. May need to restart as BP improves Hyperlipidemia Statin held on admission Recurrent UTI CAUTI History of multiple UTIs In setting of chronic suprapubic catheter. Currently, urine is positive for MSSA. Catheter exchanged on 7/24 -Management above Paroxysmal atrial fibrillation -Continue Eliquis Anion gap metabolic acidosis Secondary to kidney injury. Acidosis worsening -Sodium bicarb tablets CAD S/p CABG. Stable. AKI on CKD Possibly secondary to hydronephrosis. Discussed with urology who recommended catheter exchange. Another option is percutaneous nephrostomy tubes, however, this may not be in line with GOC -Daily BMP Chronic combined systolic and diastolic heart failure Currently euvolemic on exam. -Continue to hold Imdur in setting of sepsis and bacteremia.   Medications: Eliquis 2.5mg  tab BID,Prilosec 40mg  cap QD Ancef IVPB 1g QD no PRNs given today  Communication with IDG: Updated [facility] team. Completed Transfer Summary and Medication List faxed to Unit Adm to place on shadow chart.   Communication with PCG: Spoke to wife to support and update on patient. Made aware that [facility] will follow patient daily during hospitalization.  Goals of Care: Patient remains a full code. Today I discussed how long term antibiotics may not be in the POC for hospice. I advised wife that if patient goes home with Home Health for the antibiotics that our Palliative team could continue to follow during this time. I reviewed with her how the infections are likely to keep recurring  as well. There is a hospital Palliative consult in. Due to the wife's many concerns and wanting patient to remain a full code, it maybe beneficial for someone outside of hospice to address [place] with  her also.   Discharge Planning: Infectious disease consulted, Follow up on Bacteremia. There is discussion of placing a PICC line for long term antibiotics after discharge. This decision will be addressed on Monday.    If pt should need transportation at discharge, please contact [facility] for transport of [facility] patients when ambulance is needed at [phone].  Farrel Gordon, RN, [facility] Liaison (in [place]) [phone]

## 2018-10-20 NOTE — Progress Notes (Signed)
Pharmacy Antibiotic Note  Ronald [name] is a 85 y.o. male admitted on 10/17/2018 with sepsis. Urinary most likely source.  Pharmacy has been consulted to switch Daptomycin to Cefazolin.   Urine and blood culture show MSSA, with only BCID showing MRSA. WBC (16.2) elevated but trending down. Afebrile. Renal function improving (Scr 8/26 >> 6.13). ID is following.  Plan: Discontinue daptomycin Start Cefazolin IV 1 g every 24 hours Trend WBC, temp, renal function  F/U home ABX   Height: 5\' 4"  (162.6 cm) Weight: 147 lb 4.3 oz (66.8 kg) IBW/kg (Calculated) : 59.2  Temp (24hrs), Avg:97.9 F (36.6 C), Min:97.5 F (36.4 C), Max:98.4 F (36.9 C)  Recent Labs  Lab 10/17/18 1554 10/17/18 1707 10/17/18 1729 10/18/18 0320 10/19/18 0444 10/20/18 0352  WBC 30.0* 27.1*  --  21.7* 18.5* 16.2*  CREATININE 8.26*  --   --  7.92* 6.99* 6.13*  LATICACIDVEN  --   --  1.8  --   --   --     Estimated Creatinine Clearance: 6.8 mL/min (A) (by C-G formula based on SCr of 6.13 mg/dL (H)).    Allergies  Allergen Reactions  . Lidocaine Shortness Of Breath and Swelling    Mouth swelling   . Pantoprazole Other (See Comments)    Made the patient lightheaded and dizzy  . Nitrofurantoin Hives and Other (See Comments)    Also made the patient lightheaded and dizzy  . Sulfa Antibiotics Hives and Itching  . Tape Other (See Comments)    TAPE WILL TEAR THE SKIN!!!    Antimicrobials this admission: Aztreonam 7/22 x1  Meropenem 7/22 >> 7/23 Daptomycin 7/23 >> 7/25 Cefazolin 7/25 >>  Microbiology results: 7/22 BCx: MSSA 7/24 BCx: ng1d 7/22 UCx: MSSA  7/22 BCID: MRSA 7/22 COVID: neg  Richardine Service, PharmD PGY1 Pharmacy Resident Phone: [phone] 10/20/2018  1:30 PM  Please check [email or web address] for unit-specific pharmacy phone numbers.

## 2018-10-21 LAB — CBC
HCT: 28.8 % — ABNORMAL LOW (ref 39.0–52.0)
Hemoglobin: 9.7 g/dL — ABNORMAL LOW (ref 13.0–17.0)
MCH: 31.3 pg (ref 26.0–34.0)
MCHC: 33.7 g/dL (ref 30.0–36.0)
MCV: 92.9 fL (ref 80.0–100.0)
Platelets: 183 10*3/uL (ref 150–400)
RBC: 3.1 MIL/uL — ABNORMAL LOW (ref 4.22–5.81)
RDW: 15.8 % — ABNORMAL HIGH (ref 11.5–15.5)
WBC: 13.6 10*3/uL — ABNORMAL HIGH (ref 4.0–10.5)
nRBC: 0 % (ref 0.0–0.2)

## 2018-10-21 LAB — RENAL FUNCTION PANEL
Albumin: 2.3 g/dL — ABNORMAL LOW (ref 3.5–5.0)
Anion gap: 10 (ref 5–15)
BUN: 106 mg/dL — ABNORMAL HIGH (ref 8–23)
CO2: 19 mmol/L — ABNORMAL LOW (ref 22–32)
Calcium: 8.2 mg/dL — ABNORMAL LOW (ref 8.9–10.3)
Chloride: 108 mmol/L (ref 98–111)
Creatinine, Ser: 5.47 mg/dL — ABNORMAL HIGH (ref 0.61–1.24)
GFR calc Af Amer: 10 mL/min — ABNORMAL LOW (ref >60)
GFR calc non Af Amer: 9 mL/min — ABNORMAL LOW (ref >60)
Glucose, Bld: 142 mg/dL — ABNORMAL HIGH (ref 70–99)
Phosphorus: 4.7 mg/dL — ABNORMAL HIGH (ref 2.5–4.6)
Potassium: 3.9 mmol/L (ref 3.5–5.1)
Sodium: 137 mmol/L (ref 135–145)

## 2018-10-21 MED ORDER — SORBITOL 70 % SOLN
30.0000 mL | Freq: Every day | Status: DC | PRN
  Administered 2018-10-21: 30 mL via ORAL
  Filled 2018-10-21: qty 30

## 2018-10-21 MED ORDER — ALUM & MAG HYDROXIDE-SIMETH 200-200-20 MG/5ML PO SUSP
30.0000 mL | Freq: Four times a day (QID) | ORAL | Status: DC | PRN
  Administered 2018-10-21: 09:00:00 30 mL via ORAL
  Filled 2018-10-21: qty 30

## 2018-10-21 MED ORDER — SENNOSIDES-DOCUSATE SODIUM 8.6-50 MG PO TABS
1.0000 | ORAL_TABLET | Freq: Two times a day (BID) | ORAL | Status: DC
  Administered 2018-10-21 – 2018-10-23 (×4): 1 via ORAL
  Filled 2018-10-21 (×4): qty 1

## 2018-10-21 MED ORDER — POLYETHYLENE GLYCOL 3350 17 G PO PACK
17.0000 g | PACK | Freq: Two times a day (BID) | ORAL | Status: DC
  Administered 2018-10-21 – 2018-10-25 (×5): 17 g via ORAL
  Filled 2018-10-21 (×6): qty 1

## 2018-10-21 NOTE — Progress Notes (Addendum)
PROGRESS NOTE    PAUBLO WARSHAWSKY  AJO:[id number] DOB: [birth year] DOA: 10/17/2018 PCP: Isaac Bliss, [name], MD   Brief Narrative: Ronald [name] is a 85 y.o. medical history significant of CAD s/p CABG in 1989 and redo bypass in 1997, HTN, HLD, mild dementia, prostate cancer and atrial fibrillation,history of kidney stones and urethral stricture  sp suprapubic catheter, GERD. Patient presented with a sepsis picture and found to have MRSA bacteremia from MRSA complicated/recurrent UTI   Assessment & Plan:   Active Problems:   HYPERCHOLESTEROLEMIA   HTN (hypertension)   PROSTATE CANCER, HX OF   Urinary tract infection   Urinary tract infection associated with cystostomy catheter (HCC)   Coronary artery disease involving coronary bypass graft of native heart without angina pectoris   History of prostate cancer   Suprapubic catheter ([place])   Debility   AKI (acute kidney injury) ([place])   Chronic combined systolic (congestive) and diastolic (congestive) heart failure (HCC)   PAF (paroxysmal atrial fibrillation) (HCC)   Acute on chronic renal failure (HCC)   Anemia due to chronic kidney disease   Sepsis ([place])   MSSA bacteremia   Sepsis Present on admission. Secondary to bacteremia  MSSA Bacteremia Blood culture ID suggests MRSA while urine culture significant for MSSA. Blood culture significant for MSSA bacteremia Started on Daptomycin. ID consulted. Transthoracic Echocardiogram obtained without mention of vegetation. -ID recommendations: Switched Daptomycin to Ancef -Discussed PICC with wife. She plans on discussing with family prior to agreeing to PICC, although, she is inclined to want PICC line for antibiotic treatment  Essential hypertension Soft blood pressures initially. Holding antihypertensives for now. May need to restart as BP improves  Hyperlipidemia Statin held on admission  Recurrent UTI CAUTI History of multiple UTIs In setting of chronic suprapubic  catheter. Currently, urine is positive for MSSA. Catheter exchanged on 7/24 -Management above  Paroxysmal atrial fibrillation -Continue Eliquis  CAD S/p CABG. Stable.  AKI on CKD Possibly secondary to hydronephrosis. Discussed with urology who recommended catheter exchange. Another option is percutaneous nephrostomy tubes, however, this may not be in line with GOC -Daily BMP  Anion gap metabolic acidosis Secondary to kidney injury. Acidosis improving with sodium bicarb supplementation -Sodium bicarb tablets -BMP as above  Chronic combined systolic and diastolic heart failure Currently euvolemic on exam. -Continue to hold Imdur in setting of sepsis and bacteremia.  Goals of care Patient is currently enrolled in hospice care. Per chart review, he was previously made a DNR during recent palliative care discussion initially. Discussed with patient who was too distracted to give me useful information initially. On repeated attempts, patient states that I should defer to his wife. Discussed with the patient's wife who confirms reversal of DNR to full code at this time -Palliative care consulted and recommendations pending -Discussed with patient's PCP  Anemia of chronic kidney disease Stable.  Abdominal distension No documented stools. Patient feels like he has a lot of gas. No significant abdominal tenderness -Increase to Miralax BID -Add Senokot-S   DVT prophylaxis: Eliquis Code Status:   Code Status: Full Code Family Communication: None Disposition Plan: Discharge in several days   Consultants:   Infectious disease  Procedures:   Transthoracic Echocardiogram (10/19/2018) IMPRESSIONS    1. The left ventricle has moderate-severely reduced systolic function, with an ejection fraction of 30-35%. The cavity size was normal. There is moderate concentric left ventricular hypertrophy. Left ventricular diastolic function could not be  evaluated. Left ventricular diffuse  hypokinesis.  2. The  right ventricle has normal systolc function. The cavity was normal. There is no increase in right ventricular wall thickness. Right ventricular systolic pressure is normal with an estimated pressure of 27.6 mmHg.  3. Left atrial size was moderately dilated.  4. Right atrial size was mildly dilated.  5. Mild thickening of the aortic valve Mild calcification of the aortic valve. Aortic valve regurgitation is mild by color flow Doppler.  6. Pulmonic valve regurgitation was not assessed by color flow Doppler.   Antimicrobials:  Aztreonam  Daptomycin    Subjective: Feels like he has a lot of gas.  Objective: Vitals:   10/20/18 1930 10/20/18 2000 10/20/18 2047 10/21/18 0500  BP:   132/69 (!) 141/76  Pulse:   72 72  Resp: 14 13 16 16   Temp:   98.1 F (36.7 C) 98 F (36.7 C)  TempSrc:   Oral Oral  SpO2:   100% 99%  Weight:    64 kg  Height:        Intake/Output Summary (Last 24 hours) at 10/21/2018 1102 Last data filed at 10/21/2018 0500 Gross per 24 hour  Intake 470 ml  Output 1850 ml  Net -1380 ml   Filed Weights   10/19/18 0603 10/20/18 0550 10/21/18 0500  Weight: 66 kg 66.8 kg 64 kg    Examination:  General exam: Appears calm and comfortable Respiratory system: Clear to auscultation. Respiratory effort normal. Cardiovascular system: S1 & S2 heard, RRR. No murmurs, rubs, gallops or clicks. Gastrointestinal system: Abdomen is distended, soft and nontender. No organomegaly or masses felt. Normal bowel sounds heard. Central nervous system: Alert and oriented to person. No focal neurological deficits. Extremities: No edema. No calf tenderness Skin: No cyanosis. No rashes Psychiatry: Judgement and insight appear impaired. Mood & affect appropriate.    Data Reviewed: I have personally reviewed following labs and imaging studies  CBC: Recent Labs  Lab 10/17/18 1707 10/17/18 1732 10/18/18 0320 10/19/18 0444 10/20/18 0352 10/21/18 0722  WBC  27.1*  --  21.7* 18.5* 16.2* 13.6*  NEUTROABS 25.7*  --   --   --   --   --   HGB 9.0* 8.5* 8.7* 9.0* 8.9* 9.7*  HCT 27.0* 25.0* 25.9* 27.1* 26.3* 28.8*  MCV 95.4  --  93.8 94.8 91.6 92.9  PLT 131*  --  125* 122* 139* 130   Basic Metabolic Panel: Recent Labs  Lab 10/17/18 1554 10/17/18 1732 10/18/18 0320 10/19/18 0444 10/20/18 0352 10/21/18 0722  NA 131* 132* 135 138 136 137  K 4.5 4.3 4.7 4.1 3.3* 3.9  CL 100  --  99 107 107 108  CO2 15*  --  16* 16* 14* 19*  GLUCOSE 148*  --  146* 130* 126* 142*  BUN 114*  --  120* 117* 112* 106*  CREATININE 8.26*  --  7.92* 6.99* 6.13* 5.47*  CALCIUM 8.5*  --  8.6* 8.4* 8.3* 8.2*  MG  --   --  2.1  --   --   --   PHOS  --   --  7.6*  --   --  4.7*   GFR: Estimated Creatinine Clearance: 7.7 mL/min (A) (by C-G formula based on SCr of 5.47 mg/dL (H)). Liver Function Tests: Recent Labs  Lab 10/18/18 0320 10/21/18 0722  AST 41  --   ALT 23  --   ALKPHOS 142*  --   BILITOT 0.6  --   PROT 5.5*  --   ALBUMIN 2.8* 2.3*  Recent Labs  Lab 10/17/18 1855  LIPASE 23   No results for input(s): AMMONIA in the last 168 hours. Coagulation Profile: No results for input(s): INR, PROTIME in the last 168 hours. Cardiac Enzymes: Recent Labs  Lab 10/18/18 0320  CKTOTAL 79   BNP (last 3 results) Recent Labs    09/13/18 1503  PROBNP 10,359*   HbA1C: No results for input(s): HGBA1C in the last 72 hours. CBG: Recent Labs  Lab 10/17/18 1704  GLUCAP 109*   Lipid Profile: No results for input(s): CHOL, HDL, LDLCALC, TRIG, CHOLHDL, LDLDIRECT in the last 72 hours. Thyroid Function Tests: No results for input(s): TSH, T4TOTAL, FREET4, T3FREE, THYROIDAB in the last 72 hours. Anemia Panel: No results for input(s): VITAMINB12, FOLATE, FERRITIN, TIBC, IRON, RETICCTPCT in the last 72 hours. Sepsis Labs: Recent Labs  Lab 10/17/18 1729 10/18/18 0320  PROCALCITON  --  47.49  LATICACIDVEN 1.8  --     Recent Results (from the past 240  hour(s))  Blood culture (routine x 2)     Status: Abnormal   Collection Time: 10/17/18  5:07 PM   Specimen: BLOOD RIGHT FOREARM  Result Value Ref Range Status   Specimen Description BLOOD RIGHT FOREARM  Final   Special Requests   Final    BOTTLES DRAWN AEROBIC AND ANAEROBIC Blood Culture adequate volume   Culture  Setup Time   Final    GRAM POSITIVE COCCI IN CLUSTERS IN BOTH AEROBIC AND ANAEROBIC BOTTLES CRITICAL RESULT CALLED TO, READ BACK BY AND VERIFIED WITH: J. FRENS,PHARMD 1258 10/18/2018 TRosalia Hammers Performed at [facility], [place] [address] [place], [place] [address]    Culture STAPHYLOCOCCUS AUREUS (A)  Final   Report Status 10/20/2018 FINAL  Final   Organism ID, Bacteria STAPHYLOCOCCUS AUREUS  Final      Susceptibility   Staphylococcus aureus - MIC*    CIPROFLOXACIN >=8 RESISTANT Resistant     ERYTHROMYCIN >=8 RESISTANT Resistant     GENTAMICIN <=0.5 SENSITIVE Sensitive     OXACILLIN 0.5 SENSITIVE Sensitive     TETRACYCLINE <=1 SENSITIVE Sensitive     VANCOMYCIN <=0.5 SENSITIVE Sensitive     TRIMETH/SULFA <=10 SENSITIVE Sensitive     CLINDAMYCIN <=0.25 SENSITIVE Sensitive     RIFAMPIN <=0.5 SENSITIVE Sensitive     Inducible Clindamycin NEGATIVE Sensitive     * STAPHYLOCOCCUS AUREUS  Blood Culture ID Panel (Reflexed)     Status: Abnormal   Collection Time: 10/17/18  5:07 PM  Result Value Ref Range Status   Enterococcus species NOT DETECTED NOT DETECTED Final   Listeria monocytogenes NOT DETECTED NOT DETECTED Final   Staphylococcus species DETECTED (A) NOT DETECTED Final    Comment: CRITICAL RESULT CALLED TO, READ BACK BY AND VERIFIED WITH: J. FRENS,PHARMD 1258 10/18/2018 T. TYSOR    Staphylococcus aureus (BCID) DETECTED (A) NOT DETECTED Final    Comment: Methicillin (oxacillin)-resistant Staphylococcus aureus (MRSA). MRSA is predictably resistant to beta-lactam antibiotics (except ceftaroline). Preferred therapy is vancomycin unless clinically contraindicated.  Patient requires contact precautions if  hospitalized. CRITICAL RESULT CALLED TO, READ BACK BY AND VERIFIED WITH: J. FRENS,PHARMD 1258 10/18/2018 T. TYSOR    Methicillin resistance DETECTED (A) NOT DETECTED Final    Comment: CRITICAL RESULT CALLED TO, READ BACK BY AND VERIFIED WITH: J. FRENS,PHARMD 1258 10/18/2018 T. TYSOR    Streptococcus species NOT DETECTED NOT DETECTED Final   Streptococcus agalactiae NOT DETECTED NOT DETECTED Final   Streptococcus pneumoniae NOT DETECTED NOT DETECTED Final   Streptococcus  pyogenes NOT DETECTED NOT DETECTED Final   Acinetobacter baumannii NOT DETECTED NOT DETECTED Final   Enterobacteriaceae species NOT DETECTED NOT DETECTED Final   Enterobacter cloacae complex NOT DETECTED NOT DETECTED Final   Escherichia coli NOT DETECTED NOT DETECTED Final   Klebsiella oxytoca NOT DETECTED NOT DETECTED Final   Klebsiella pneumoniae NOT DETECTED NOT DETECTED Final   Proteus species NOT DETECTED NOT DETECTED Final   Serratia marcescens NOT DETECTED NOT DETECTED Final   Haemophilus influenzae NOT DETECTED NOT DETECTED Final   Neisseria meningitidis NOT DETECTED NOT DETECTED Final   Pseudomonas aeruginosa NOT DETECTED NOT DETECTED Final   Candida albicans NOT DETECTED NOT DETECTED Final   Candida glabrata NOT DETECTED NOT DETECTED Final   Candida krusei NOT DETECTED NOT DETECTED Final   Candida parapsilosis NOT DETECTED NOT DETECTED Final   Candida tropicalis NOT DETECTED NOT DETECTED Final    Comment: Performed at [facility], [place] [address], [place], [place] [address]  SARS Coronavirus 2 (CEPHEID- Performed in [facility] lab), Hosp Order     Status: None   Collection Time: 10/17/18  5:19 PM   Specimen: Nasopharyngeal  Result Value Ref Range Status   SARS Coronavirus 2 NEGATIVE NEGATIVE Final    Comment: (NOTE) If result is NEGATIVE SARS-CoV-2 target nucleic acids are NOT DETECTED. The SARS-CoV-2 RNA is generally detectable in upper and lower   respiratory specimens during the acute phase of infection. The lowest  concentration of SARS-CoV-2 viral copies this assay can detect is 250  copies / mL. A negative result does not preclude SARS-CoV-2 infection  and should not be used as the sole basis for treatment or other  patient management decisions.  A negative result may occur with  improper specimen collection / handling, submission of specimen other  than nasopharyngeal swab, presence of viral mutation(s) within the  areas targeted by this assay, and inadequate number of viral copies  (<250 copies / mL). A negative result must be combined with clinical  observations, patient history, and epidemiological information. If result is POSITIVE SARS-CoV-2 target nucleic acids are DETECTED. The SARS-CoV-2 RNA is generally detectable in upper and lower  respiratory specimens dur ing the acute phase of infection.  Positive  results are indicative of active infection with SARS-CoV-2.  Clinical  correlation with patient history and other diagnostic information is  necessary to determine patient infection status.  Positive results do  not rule out bacterial infection or co-infection with other viruses. If result is PRESUMPTIVE POSTIVE SARS-CoV-2 nucleic acids MAY BE PRESENT.   A presumptive positive result was obtained on the submitted specimen  and confirmed on repeat testing.  While 2019 novel coronavirus  (SARS-CoV-2) nucleic acids may be present in the submitted sample  additional confirmatory testing may be necessary for epidemiological  and / or clinical management purposes  to differentiate between  SARS-CoV-2 and other Sarbecovirus currently known to infect humans.  If clinically indicated additional testing with an alternate test  methodology [phone]) is advised. The SARS-CoV-2 RNA is generally  detectable in upper and lower respiratory sp ecimens during the acute  phase of infection. The expected result is Negative. Fact  Sheet for Patients:  StrictlyIdeas.no Fact Sheet for Healthcare Providers: BankingDealers.co.za This test is not yet approved or cleared by the [place] FDA and has been authorized for detection and/or diagnosis of SARS-CoV-2 by FDA under an Emergency Use Authorization (EUA).  This EUA will remain in effect (meaning this test can be used)  for the duration of the COVID-19 declaration under Section 564(b)(1) of the Act, 21 U.S.C. section 360bbb-3(b)(1), unless the authorization is terminated or revoked sooner. Performed at [facility], [place] [address], [place], [place] [address]   Urine culture     Status: Abnormal   Collection Time: 10/17/18  5:21 PM   Specimen: Urine, Random  Result Value Ref Range Status   Specimen Description URINE, RANDOM  Final   Special Requests   Final    NONE Performed at [facility], [place] [address], [place], [place] [address]    Culture >=100,000 COLONIES/mL STAPHYLOCOCCUS AUREUS (A)  Final   Report Status 10/19/2018 FINAL  Final   Organism ID, Bacteria STAPHYLOCOCCUS AUREUS (A)  Final      Susceptibility   Staphylococcus aureus - MIC*    CIPROFLOXACIN 4 RESISTANT Resistant     GENTAMICIN <=0.5 SENSITIVE Sensitive     NITROFURANTOIN <=16 SENSITIVE Sensitive     OXACILLIN 0.5 SENSITIVE Sensitive     TETRACYCLINE <=1 SENSITIVE Sensitive     VANCOMYCIN <=0.5 SENSITIVE Sensitive     TRIMETH/SULFA <=10 SENSITIVE Sensitive     CLINDAMYCIN <=0.25 SENSITIVE Sensitive     RIFAMPIN <=0.5 SENSITIVE Sensitive     Inducible Clindamycin NEGATIVE Sensitive     * >=100,000 COLONIES/mL STAPHYLOCOCCUS AUREUS  Blood culture (routine x 2)     Status: Abnormal   Collection Time: 10/17/18  5:23 PM   Specimen: BLOOD  Result Value Ref Range Status   Specimen Description BLOOD LEFT ANTECUBITAL  Final   Special Requests   Final    BOTTLES DRAWN AEROBIC AND ANAEROBIC Blood Culture results may not be optimal due  to an inadequate volume of blood received in culture bottles   Culture  Setup Time   Final    GRAM POSITIVE COCCI IN CLUSTERS IN BOTH AEROBIC AND ANAEROBIC BOTTLES CRITICAL RESULT CALLED TO, READ BACK BY AND VERIFIED WITH: J. FRENS,PHARMD 1258 10/18/2018 T. TYSOR    Culture (A)  Final    STAPHYLOCOCCUS AUREUS SUSCEPTIBILITIES PERFORMED ON PREVIOUS CULTURE WITHIN THE LAST 5 DAYS. Performed at [facility], [place] [address], [place], [place] [address]    Report Status 10/20/2018 FINAL  Final  Culture, blood (routine x 2)     Status: None (Preliminary result)   Collection Time: 10/19/18  4:44 AM   Specimen: BLOOD  Result Value Ref Range Status   Specimen Description BLOOD LEFT ARM  Final   Special Requests   Final    BOTTLES DRAWN AEROBIC ONLY Blood Culture adequate volume   Culture   Final    NO GROWTH 2 DAYS Performed at [facility], [place] [address] [place], [place] [address]    Report Status PENDING  Incomplete  Culture, blood (routine x 2)     Status: None (Preliminary result)   Collection Time: 10/19/18  4:44 AM   Specimen: BLOOD  Result Value Ref Range Status   Specimen Description BLOOD LEFT ARM  Final   Special Requests   Final    BOTTLES DRAWN AEROBIC ONLY Blood Culture adequate volume   Culture   Final    NO GROWTH 2 DAYS Performed at [facility], [place] [address], [place],  [address]    Report Status PENDING  Incomplete         Radiology Studies: No results found.      Scheduled Meds:  apixaban  2.5 mg Oral BID   omeprazole  40 mg Oral Daily   polyethylene glycol  17 g Oral Daily   sodium bicarbonate  650 mg Oral TID   sodium chloride flush  3 mL Intravenous Once   Continuous Infusions:   ceFAZolin (ANCEF) IV 1 g (10/20/18 1611)     LOS: 3 days     Cordelia Poche, MD [facility] 10/21/2018, 11:02 AM  If 7PM-7AM, please contact night-coverage [email or web address]

## 2018-10-21 NOTE — Progress Notes (Signed)
Palliative Care  Patient is an active patient with [facility] and hospital admission is under GIP Hospice benefit. The hospice team is responsible for his care plan and they have been actively engaged in ongoing goals of care discussions. After review of [facility] liaison note they suggest a goals of care meeting done by someone outside of hospice. Please see inpatient consult note dated 7/10- I suspect family cannot care for him at home or were unprepared to handle his decline and symptoms especially if the hospice team could not physically respond to their needs and assist with the care due to covid. I am not sure how his code status related to this or why this was changed, but Ronald [name] was clear on his wishes and we should make every effort to provide clinical guidance to make sure these are honored. I am happy to contact Ronald [name] but would like to speak with his hospice RN and review those records first to better understand events leading up to this admission.  Lane Hacker, DO Palliative Medicine [phone]

## 2018-10-21 NOTE — Progress Notes (Addendum)
MC 6E 30 - [facility] ([facility]) GIP RN Note  This is a related and covered GIP admission of 10/17/2018 with [facility] diagnosis of ESRD with Heart Failure per Dr. Konrad Dolores of [facility]. Patient is a Full Code. Family notified [facility] on call service that patient wife was going to activate EMS for transport to Hospital Emergency Room for evaluation of pain, fever and extreme SOB. Patient has been admitted to hospital for possible UTI/Sepsis.   Unable to visit patient due to COVID restrictions. Spoke with Dr. Hilma Favors who visited with patient today. Reviewed current treatments. Patient is still confused, this may be increased due to his being [facility].  Dr. Hilma Favors also advised that this patient can have a visitor due to his mental status.   VS: 98.2 , 111/66, 80, 16, 97% RA I/O: 710/2350  Abn labs: CO2 19, Glucose 142, BUN 106, Creatinine 5.47, Calcium 8.2, Phos 4.7, Albumin 2.3, GFR 9, WBC 13.6, RBC 3.10, Hgb 9.7, HCT 28.8, RDW 15.8 Urine and blood culture show MSSA, with only BCID showing MRSA.  Per MD notes: Sepsis Present on admission. Secondary to bacteremia MSSA Bacteremia Blood culture ID suggests MRSA while urine culture significant for MSSA. Blood culture significant for MSSA bacteremia Started on Daptomycin. ID consulted. Transthoracic Echocardiogram obtained without mention of vegetation. -ID recommendations: Switched Daptomycin to Ancef -Discussed PICC with wife. She plans on discussing with family prior to agreeing to PICC, although, she is inclined to want PICC line for antibiotic treatment Essential hypertension Soft blood pressures initially. Holding antihypertensives for now. May need to restart as BP improves Hyperlipidemia Statin held on admission Recurrent UTI CAUTI History of multiple UTIs In setting of chronic suprapubic catheter. Currently, urine is positive for MSSA. Catheter exchanged on 7/24 -Management above Paroxysmal atrial fibrillation -Continue Eliquis CAD S/p CABG.  Stable. AKI on CKD Possibly secondary to hydronephrosis. Discussed with urology who recommended catheter exchange. Another option is percutaneous nephrostomy tubes, however, this may not be in line with GOC -Daily BMP Anion gap metabolic acidosis Secondary to kidney injury. Acidosis improving with sodium bicarb supplementation -Sodium bicarb tablets -BMP as above Chronic combined systolic and diastolic heart failure Currently euvolemic on exam. -Continue to hold Imdur in setting of sepsis and bacteremia. Goals of care Patient is currently enrolled in hospice care. Per chart review, he was previously made a DNR during recent palliative care discussion. Discussed with patient who was too distracted to give me useful information. Discussed with the patient's wife who confirms reversal of DNR to full code at this time -Palliative care consulted and recommendations pending -Discussed with patient's PCP Anemia of chronic kidney disease Stable. Abdominal distension No documented stools. Patient feels like he has a lot of gas. No significant abdominal tenderness -Increase to Miralax BID -Add Senokot-S  Per Dr. Armandina Gemma notes: "I suspect family cannot care for him at home or were unprepared to handle his decline and symptoms especially if the hospice team could not physically respond to their needs and assist with the care due to covid. I am not sure how his code status related to this or why this was changed, but Mr. Fei was clear on his wishes and we should make every effort to provide clinical guidance to make sure these are honored."   Communication with [facility]: Updated [employer] team. CompletedTransfer Summary and Medication List faxed to Unit Adm to place on shadow chart.  Communication with [facility]: Spoke to wife to support and update on patient.Made aware that [place] will follow  patient daily during hospitalization.  Goals of Care: Patient remains a full code. Attempted again to  discuss [place] with wife. She wants patient to remain a full code but according to Dr. Delanna Ahmadi note this is not the patient's wishes from last hospital admission. Dr. Hilma Favors is going to reach out to patient's wife for further discussion as well.   Discharge Planning: Infectious disease consulted, Follow up on Bacteremia. There is discussion of placing a PICC line for long term antibiotics after discharge. This decision will be addressed on Monday.   If pt should need transportation at discharge, please contact [facility] for transport of [facility] patients when ambulanceis needed at [phone].  Farrel Gordon, RN, [facility] Liaison (in [place]) [phone]

## 2018-10-22 DIAGNOSIS — Z7189 Other specified counseling: Secondary | ICD-10-CM

## 2018-10-22 DIAGNOSIS — D72829 Elevated white blood cell count, unspecified: Secondary | ICD-10-CM

## 2018-10-22 DIAGNOSIS — R7881 Bacteremia: Secondary | ICD-10-CM

## 2018-10-22 DIAGNOSIS — Z515 Encounter for palliative care: Secondary | ICD-10-CM

## 2018-10-22 LAB — RENAL FUNCTION PANEL
Albumin: 2.2 g/dL — ABNORMAL LOW (ref 3.5–5.0)
Anion gap: 13 (ref 5–15)
BUN: 104 mg/dL — ABNORMAL HIGH (ref 8–23)
CO2: 17 mmol/L — ABNORMAL LOW (ref 22–32)
Calcium: 8.2 mg/dL — ABNORMAL LOW (ref 8.9–10.3)
Chloride: 107 mmol/L (ref 98–111)
Creatinine, Ser: 5.48 mg/dL — ABNORMAL HIGH (ref 0.61–1.24)
GFR calc Af Amer: 10 mL/min — ABNORMAL LOW (ref >60)
GFR calc non Af Amer: 8 mL/min — ABNORMAL LOW (ref >60)
Glucose, Bld: 145 mg/dL — ABNORMAL HIGH (ref 70–99)
Phosphorus: 5 mg/dL — ABNORMAL HIGH (ref 2.5–4.6)
Potassium: 4.2 mmol/L (ref 3.5–5.1)
Sodium: 137 mmol/L (ref 135–145)

## 2018-10-22 MED ORDER — ACETAMINOPHEN 160 MG/5ML PO SOLN: 650.0000 mg | Freq: Three times a day (TID) | ORAL | Status: DC

## 2018-10-22 MED ORDER — ACETAMINOPHEN 160 MG/5ML PO SOLN: 1000.0000 mg | Freq: Three times a day (TID) | ORAL | Status: DC

## 2018-10-22 MED ORDER — ACETAMINOPHEN 500 MG PO TABS
1000.0000 mg | ORAL_TABLET | Freq: Three times a day (TID) | ORAL | Status: DC
  Administered 2018-10-22 – 2018-10-25 (×9): 1000 mg via ORAL
  Filled 2018-10-22 (×9): qty 2

## 2018-10-22 NOTE — NC FL2 (Signed)
[place] LEVEL OF CARE SCREENING TOOL     IDENTIFICATION  Patient Name: Ronald [name] Birthdate: 1928/03/03 Sex: male Admission Date (Current Location): 10/17/2018  [facility] and [place] Number:  Herbalist and Address:  [facility] [place], [place] [address], [place], [place] [address]      Provider Number: [id number]  Attending Physician Name and Address:  Roxan Hockey, MD  Relative Name and Phone Number:  Anuj Summons, [phone]    Current Level of Care: Hospital Recommended Level of Care: [place] Prior Approval Number:    Date Approved/Denied:   PASRR Number: [id number]  Discharge Plan: SNF    Current Diagnoses: Patient Active Problem List   Diagnosis Date Noted  . MSSA bacteremia 10/18/2018  . Sepsis ([place]) 10/17/2018  . Bladder spasms   . Goals of care, counseling/discussion   . Chronic bilateral thoracic back pain   . Shortness of breath   . Palliative care by specialist   . Cardiorenal syndrome 10/04/2018  . Compression fracture of thoracic spine, non-traumatic, initial encounter ([place]) 10/04/2018  . Anemia due to chronic kidney disease 09/17/2018  . Acute on chronic renal failure ([place]) 09/14/2018  . Elevated troponin 08/28/2018  . Normocytic anemia 08/28/2018  . UTI (urinary tract infection) 03/06/2018  . Chronic combined systolic (congestive) and diastolic (congestive) heart failure ([place]) 12/26/2017  . PAF (paroxysmal atrial fibrillation) ([place]) 12/26/2017  . Fever 05/11/2017  . AKI (acute kidney injury) ([place])   . Bacteremia due to Pseudomonas 01/21/2016  . Debility 01/21/2016  . History of Bell's palsy   . Gastroesophageal reflux disease without esophagitis   . Coronary artery disease involving coronary bypass graft of native heart without angina pectoris   . History of prostate cancer   . Suprapubic catheter ([place])   . Incontinence of feces   . Renal failure syndrome   . Bacteremia   .  NSTEMI (non-ST elevated myocardial infarction) ([place]) 01/16/2016  . ARF (acute renal failure) ([place]) 10/13/2015  . Influenza A 12/05/2014  . Urinary tract infection associated with cystostomy catheter ([place]) 10/03/2014  . Acute lower UTI 09/10/2014  . Bacteremia due to Enterococcus 01/22/2014  . Urethral stricture 12/25/2013  . Bladder calculus 12/25/2013  . LV dysfunction 09/23/2013  . Unstable angina ([place]) 08/27/2013  . Bacteremia due to Escherichia coli 05/20/2012  . Urinary tract infection 09/15/2011  . Calculus of kidney 09/12/2011  . HYPERCHOLESTEROLEMIA 10/18/2007  . HTN (hypertension) 10/18/2007  . OSTEOARTHRITIS 02/07/2007  . BELL'S PALSY, RIGHT 10/26/2006  . Coronary atherosclerosis 10/26/2006  . PROSTATE CANCER, HX OF 10/26/2006  . NEPHROLITHIASIS, HX OF 10/26/2006    Orientation RESPIRATION BLADDER Height & Weight     Self, Time, Situation, Place(periods of confusing)  Normal Continent Weight: 139 lb 1.8 oz (63.1 kg) Height:  5\' 4"  (162.6 cm)  BEHAVIORAL SYMPTOMS/MOOD NEUROLOGICAL BOWEL NUTRITION STATUS      Incontinent Diet(heart healthy, thin liquids)  AMBULATORY STATUS COMMUNICATION OF NEEDS Skin   Limited Assist Verbally Normal                       Personal Care Assistance Level of Assistance  Bathing, Feeding, Dressing Bathing Assistance: Limited assistance Feeding assistance: Independent Dressing Assistance: Limited assistance     Functional Limitations Info  Sight, Hearing, Speech Sight Info: Adequate Hearing Info: Adequate Speech Info: Adequate    SPECIAL CARE FACTORS FREQUENCY  PT (By licensed PT), OT (By licensed OT)  PT Frequency: 5x OT Frequency: 5x            Contractures Contractures Info: Not present    Additional Factors Info  Code Status, Allergies, Isolation Precautions Code Status Info: full code Allergies Info: Lidocaine, Pantoprazole, Nitrofurantoin, Sulfa Antibiotics, Tape     Isolation Precautions Info: MRSA      Current Medications (10/22/2018):  This is the current hospital active medication list Current Facility-Administered Medications  Medication Dose Route Frequency Provider Last Rate Last Dose  . acetaminophen (TYLENOL) tablet 1,000 mg  1,000 mg Oral TID Pershing Proud, NP      . alum & mag hydroxide-simeth (MAALOX/MYLANTA) 200-200-20 MG/5ML suspension 30 mL  30 mL Oral Q6H PRN Mariel Aloe, MD   30 mL at 10/21/18 0916  . apixaban (ELIQUIS) tablet 2.5 mg  2.5 mg Oral BID Toy Baker, MD   2.5 mg at 10/22/18 1107  . ceFAZolin (ANCEF) IVPB 1 g/50 mL premix  1 g Intravenous Q24H Richardine Service, RPH 100 mL/hr at 10/21/18 1248 1 g at 10/21/18 1248  . flavoxATE (URISPAS) tablet 100 mg  100 mg Oral TID PRN Toy Baker, MD      . omeprazole (PRILOSEC) capsule 40 mg  40 mg Oral Daily Doutova, Anastassia, MD   40 mg at 10/22/18 1107  . ondansetron (ZOFRAN) tablet 4 mg  4 mg Oral Q6H PRN Toy Baker, MD       Or  . ondansetron (ZOFRAN) injection 4 mg  4 mg Intravenous Q6H PRN Doutova, Anastassia, MD      . oxyCODONE (Oxy IR/ROXICODONE) immediate release tablet 5 mg  5 mg Oral Q4H PRN Toy Baker, MD   5 mg at 10/22/18 1107  . polyethylene glycol (MIRALAX / GLYCOLAX) packet 17 g  17 g Oral BID Mariel Aloe, MD   17 g at 10/21/18 2128  . senna-docusate (Senokot-S) tablet 1 tablet  1 tablet Oral BID Mariel Aloe, MD   1 tablet at 10/21/18 2129  . sodium bicarbonate tablet 650 mg  650 mg Oral TID Mariel Aloe, MD   650 mg at 10/22/18 1107  . sodium chloride flush (NS) 0.9 % injection 3 mL  3 mL Intravenous Once Doutova, Anastassia, MD      . sorbitol 70 % solution 30 mL  30 mL Oral Daily PRN Mariel Aloe, MD   30 mL at 10/21/18 4888     Discharge Medications: Please see discharge summary for a list of discharge medications.  Relevant Imaging Results:  Relevant Lab Results:   Additional Information [phone]  Eileen Stanford, LCSW

## 2018-10-22 NOTE — Progress Notes (Addendum)
[facility] for Infectious Disease  Date of Admission:  10/17/2018     Total days of antibiotics 5        ASSESSMENT/PLAN  Mr. Lingafelter is an 85 year old male with multiple medical problems including prostate cancer status post suprapubic catheter admitted with generalized weakness, fever, and chills and found to have MSSA bacteremia as well as MSSA in his urine.  TTE without evidence of endocarditis. Previously on hospice and determining goals of care.   MSSA bacteremia -likely urinary source from suprapubic catheter.  Repeat blood cultures without growth to date.  Discussed with Mrs. Seybold recommendations for treatment of 6 weeks of IV antibiotics with cefazolin and need for PICC line depending upon goals of care.  Continue current dose of cefazolin.  Goals of care -discussed difference between palliative medicine and hospice care.  Awaiting further discussion with palliative medicine team to determine goals of care.  Final antibiotic recommendations pending goals of care outcome.  Acute kidney injury on chronic kidney disease -creatinine stable at 5.48.  GFR remains low at 8.  Nephrology indicated patient would not want dialysis.  Potassium levels within normal ranges.  Continue management per primary team.   Active Problems:   HYPERCHOLESTEROLEMIA   HTN (hypertension)   PROSTATE CANCER, HX OF   Urinary tract infection   Urinary tract infection associated with cystostomy catheter (HCC)   Coronary artery disease involving coronary bypass graft of native heart without angina pectoris   History of prostate cancer   Suprapubic catheter ([place])   Debility   AKI (acute kidney injury) ([place])   Chronic combined systolic (congestive) and diastolic (congestive) heart failure (HCC)   PAF (paroxysmal atrial fibrillation) (HCC)   Acute on chronic renal failure (HCC)   Anemia due to chronic kidney disease   Sepsis ([place])   MSSA bacteremia   . acetaminophen  1,000 mg Oral TID  . apixaban   2.5 mg Oral BID  . omeprazole  40 mg Oral Daily  . polyethylene glycol  17 g Oral BID  . senna-docusate  1 tablet Oral BID  . sodium bicarbonate  650 mg Oral TID  . sodium chloride flush  3 mL Intravenous Once    SUBJECTIVE:  Afebrile overnight with no acute events.  Leukocytosis improving with most recent white blood cell count of 13.6.  Mrs. Mierzwa is present at Mr. Rusk's bedside.  He is resting.  Allergies  Allergen Reactions  . Lidocaine Shortness Of Breath and Swelling    Mouth swelling   . Pantoprazole Other (See Comments)    Made the patient lightheaded and dizzy  . Nitrofurantoin Hives and Other (See Comments)    Also made the patient lightheaded and dizzy  . Sulfa Antibiotics Hives and Itching  . Tape Other (See Comments)    TAPE WILL TEAR THE SKIN!!!     Review of Systems: Review of Systems  Constitutional: Negative for chills, fever and weight loss.  Respiratory: Negative for cough, shortness of breath and wheezing.   Cardiovascular: Negative for chest pain and leg swelling.  Gastrointestinal: Negative for constipation, diarrhea, nausea and vomiting.  Skin: Negative for rash.    OBJECTIVE: Vitals:   10/21/18 0500 10/21/18 1515 10/21/18 2030 10/22/18 0621  BP: (!) 141/76 111/66 (!) 109/48 122/63  Pulse: 72 80 72 71  Resp: 16  18   Temp: 98 F (36.7 C) 98.2 F (36.8 C) 97.6 F (36.4 C) 97.8 F (36.6 C)  TempSrc: Oral Oral Oral Oral  SpO2:  99% 97% 99% 99%  Weight: 64 kg   63.1 kg  Height:       Body mass index is 23.88 kg/m.  Physical Exam Constitutional:      General: He is not in acute distress.    Appearance: He is well-developed. He is ill-appearing.     Comments: Lying in bed with head of bed elevated; sleeping; arousable  Cardiovascular:     Rate and Rhythm: Normal rate and regular rhythm.     Heart sounds: Normal heart sounds.  Pulmonary:     Effort: Pulmonary effort is normal.     Breath sounds: Normal breath sounds.  Abdominal:      Tenderness: There is abdominal tenderness.  Skin:    General: Skin is warm and dry.  Psychiatric:        Mood and Affect: Mood normal.     Lab Results Lab Results  Component Value Date   WBC 13.6 (H) 10/21/2018   HGB 9.7 (L) 10/21/2018   HCT 28.8 (L) 10/21/2018   MCV 92.9 10/21/2018   PLT 183 10/21/2018    Lab Results  Component Value Date   CREATININE 5.48 (H) 10/22/2018   BUN 104 (H) 10/22/2018   NA 137 10/22/2018   K 4.2 10/22/2018   CL 107 10/22/2018   CO2 17 (L) 10/22/2018    Lab Results  Component Value Date   ALT 23 10/18/2018   AST 41 10/18/2018   ALKPHOS 142 (H) 10/18/2018   BILITOT 0.6 10/18/2018     Microbiology: Recent Results (from the past 240 hour(s))  Blood culture (routine x 2)     Status: Abnormal   Collection Time: 10/17/18  5:07 PM   Specimen: BLOOD RIGHT FOREARM  Result Value Ref Range Status   Specimen Description BLOOD RIGHT FOREARM  Final   Special Requests   Final    BOTTLES DRAWN AEROBIC AND ANAEROBIC Blood Culture adequate volume   Culture  Setup Time   Final    GRAM POSITIVE COCCI IN CLUSTERS IN BOTH AEROBIC AND ANAEROBIC BOTTLES CRITICAL RESULT CALLED TO, READ BACK BY AND VERIFIED WITH: J. FRENS,PHARMD 1258 10/18/2018 TRosalia Hammers Performed at [facility], [place] [address], [place], [place] [address]    Culture STAPHYLOCOCCUS AUREUS (A)  Final   Report Status 10/20/2018 FINAL  Final   Organism ID, Bacteria STAPHYLOCOCCUS AUREUS  Final      Susceptibility   Staphylococcus aureus - MIC*    CIPROFLOXACIN >=8 RESISTANT Resistant     ERYTHROMYCIN >=8 RESISTANT Resistant     GENTAMICIN <=0.5 SENSITIVE Sensitive     OXACILLIN 0.5 SENSITIVE Sensitive     TETRACYCLINE <=1 SENSITIVE Sensitive     VANCOMYCIN <=0.5 SENSITIVE Sensitive     TRIMETH/SULFA <=10 SENSITIVE Sensitive     CLINDAMYCIN <=0.25 SENSITIVE Sensitive     RIFAMPIN <=0.5 SENSITIVE Sensitive     Inducible Clindamycin NEGATIVE Sensitive     * STAPHYLOCOCCUS AUREUS   Blood Culture ID Panel (Reflexed)     Status: Abnormal   Collection Time: 10/17/18  5:07 PM  Result Value Ref Range Status   Enterococcus species NOT DETECTED NOT DETECTED Final   Listeria monocytogenes NOT DETECTED NOT DETECTED Final   Staphylococcus species DETECTED (A) NOT DETECTED Final    Comment: CRITICAL RESULT CALLED TO, READ BACK BY AND VERIFIED WITH: J. FRENS,PHARMD 1258 10/18/2018 T. TYSOR    Staphylococcus aureus (BCID) DETECTED (A) NOT DETECTED Final    Comment: Methicillin (oxacillin)-resistant Staphylococcus aureus (  MRSA). MRSA is predictably resistant to beta-lactam antibiotics (except ceftaroline). Preferred therapy is vancomycin unless clinically contraindicated. Patient requires contact precautions if  hospitalized. CRITICAL RESULT CALLED TO, READ BACK BY AND VERIFIED WITH: J. FRENS,PHARMD 1258 10/18/2018 T. TYSOR    Methicillin resistance DETECTED (A) NOT DETECTED Final    Comment: CRITICAL RESULT CALLED TO, READ BACK BY AND VERIFIED WITH: J. FRENS,PHARMD 1258 10/18/2018 T. TYSOR    Streptococcus species NOT DETECTED NOT DETECTED Final   Streptococcus agalactiae NOT DETECTED NOT DETECTED Final   Streptococcus pneumoniae NOT DETECTED NOT DETECTED Final   Streptococcus pyogenes NOT DETECTED NOT DETECTED Final   Acinetobacter baumannii NOT DETECTED NOT DETECTED Final   Enterobacteriaceae species NOT DETECTED NOT DETECTED Final   Enterobacter cloacae complex NOT DETECTED NOT DETECTED Final   Escherichia coli NOT DETECTED NOT DETECTED Final   Klebsiella oxytoca NOT DETECTED NOT DETECTED Final   Klebsiella pneumoniae NOT DETECTED NOT DETECTED Final   Proteus species NOT DETECTED NOT DETECTED Final   Serratia marcescens NOT DETECTED NOT DETECTED Final   Haemophilus influenzae NOT DETECTED NOT DETECTED Final   Neisseria meningitidis NOT DETECTED NOT DETECTED Final   Pseudomonas aeruginosa NOT DETECTED NOT DETECTED Final   Candida albicans NOT DETECTED NOT DETECTED Final    Candida glabrata NOT DETECTED NOT DETECTED Final   Candida krusei NOT DETECTED NOT DETECTED Final   Candida parapsilosis NOT DETECTED NOT DETECTED Final   Candida tropicalis NOT DETECTED NOT DETECTED Final    Comment: Performed at [facility], [place] [address] [place], [place] [address]  SARS Coronavirus 2 (CEPHEID- Performed in [place] hospital lab), Hosp Order     Status: None   Collection Time: 10/17/18  5:19 PM   Specimen: Nasopharyngeal  Result Value Ref Range Status   SARS Coronavirus 2 NEGATIVE NEGATIVE Final    Comment: (NOTE) If result is NEGATIVE SARS-CoV-2 target nucleic acids are NOT DETECTED. The SARS-CoV-2 RNA is generally detectable in upper and lower  respiratory specimens during the acute phase of infection. The lowest  concentration of SARS-CoV-2 viral copies this assay can detect is 250  copies / mL. A negative result does not preclude SARS-CoV-2 infection  and should not be used as the sole basis for treatment or other  patient management decisions.  A negative result may occur with  improper specimen collection / handling, submission of specimen other  than nasopharyngeal swab, presence of viral mutation(s) within the  areas targeted by this assay, and inadequate number of viral copies  (<250 copies / mL). A negative result must be combined with clinical  observations, patient history, and epidemiological information. If result is POSITIVE SARS-CoV-2 target nucleic acids are DETECTED. The SARS-CoV-2 RNA is generally detectable in upper and lower  respiratory specimens dur ing the acute phase of infection.  Positive  results are indicative of active infection with SARS-CoV-2.  Clinical  correlation with patient history and other diagnostic information is  necessary to determine patient infection status.  Positive results do  not rule out bacterial infection or co-infection with other viruses. If result is PRESUMPTIVE POSTIVE SARS-CoV-2 nucleic acids  MAY BE PRESENT.   A presumptive positive result was obtained on the submitted specimen  and confirmed on repeat testing.  While 2019 novel coronavirus  (SARS-CoV-2) nucleic acids may be present in the submitted sample  additional confirmatory testing may be necessary for epidemiological  and / or clinical management purposes  to differentiate between  SARS-CoV-2 and other Sarbecovirus currently  known to infect humans.  If clinically indicated additional testing with an alternate test  methodology [phone]) is advised. The SARS-CoV-2 RNA is generally  detectable in upper and lower respiratory sp ecimens during the acute  phase of infection. The expected result is Negative. Fact Sheet for Patients:  StrictlyIdeas.no Fact Sheet for Healthcare Providers: BankingDealers.co.za This test is not yet approved or cleared by the [place] FDA and has been authorized for detection and/or diagnosis of SARS-CoV-2 by FDA under an Emergency Use Authorization (EUA).  This EUA will remain in effect (meaning this test can be used) for the duration of the COVID-19 declaration under Section 564(b)(1) of the Act, 21 U.S.C. section 360bbb-3(b)(1), unless the authorization is terminated or revoked sooner. Performed at [facility], [place] [address], [place], [place] [address]   Urine culture     Status: Abnormal   Collection Time: 10/17/18  5:21 PM   Specimen: Urine, Random  Result Value Ref Range Status   Specimen Description URINE, RANDOM  Final   Special Requests   Final    NONE Performed at [facility], [place] [address] [place], [place] [address]    Culture >=100,000 COLONIES/mL STAPHYLOCOCCUS AUREUS (A)  Final   Report Status 10/19/2018 FINAL  Final   Organism ID, Bacteria STAPHYLOCOCCUS AUREUS (A)  Final      Susceptibility   Staphylococcus aureus - MIC*    CIPROFLOXACIN 4 RESISTANT Resistant     GENTAMICIN <=0.5 SENSITIVE Sensitive      NITROFURANTOIN <=16 SENSITIVE Sensitive     OXACILLIN 0.5 SENSITIVE Sensitive     TETRACYCLINE <=1 SENSITIVE Sensitive     VANCOMYCIN <=0.5 SENSITIVE Sensitive     TRIMETH/SULFA <=10 SENSITIVE Sensitive     CLINDAMYCIN <=0.25 SENSITIVE Sensitive     RIFAMPIN <=0.5 SENSITIVE Sensitive     Inducible Clindamycin NEGATIVE Sensitive     * >=100,000 COLONIES/mL STAPHYLOCOCCUS AUREUS  Blood culture (routine x 2)     Status: Abnormal   Collection Time: 10/17/18  5:23 PM   Specimen: BLOOD  Result Value Ref Range Status   Specimen Description BLOOD LEFT ANTECUBITAL  Final   Special Requests   Final    BOTTLES DRAWN AEROBIC AND ANAEROBIC Blood Culture results may not be optimal due to an inadequate volume of blood received in culture bottles   Culture  Setup Time   Final    GRAM POSITIVE COCCI IN CLUSTERS IN BOTH AEROBIC AND ANAEROBIC BOTTLES CRITICAL RESULT CALLED TO, READ BACK BY AND VERIFIED WITH: J. FRENS,PHARMD 1258 10/18/2018 T. TYSOR    Culture (A)  Final    STAPHYLOCOCCUS AUREUS SUSCEPTIBILITIES PERFORMED ON PREVIOUS CULTURE WITHIN THE LAST 5 DAYS. Performed at [facility], [place] [address], [place], [place] [address]    Report Status 10/20/2018 FINAL  Final  Culture, blood (routine x 2)     Status: None (Preliminary result)   Collection Time: 10/19/18  4:44 AM   Specimen: BLOOD  Result Value Ref Range Status   Specimen Description BLOOD LEFT ARM  Final   Special Requests   Final    BOTTLES DRAWN AEROBIC ONLY Blood Culture adequate volume   Culture   Final    NO GROWTH 3 DAYS Performed at [facility], [place] [address] [place], [place] [address]    Report Status PENDING  Incomplete  Culture, blood (routine x 2)     Status: None (Preliminary result)   Collection Time: 10/19/18  4:44 AM  Specimen: BLOOD  Result Value Ref Range Status   Specimen Description BLOOD LEFT ARM  Final   Special Requests   Final    BOTTLES DRAWN AEROBIC ONLY Blood Culture adequate volume    Culture   Final    NO GROWTH 3 DAYS Performed at [facility], [address] [place], [place] [address]    Report Status PENDING  Incomplete     Terri Piedra, [place] for [place] Pager  10/22/2018  1:59 PM

## 2018-10-22 NOTE — Progress Notes (Signed)
Physical Therapy Treatment Patient Details Name: Ronald [name] MRN: [id number] DOB: 09/29/1927 Today's Date: 10/22/2018    History of Present Illness Patient is a 85 y/o male who presents with fever fatigue and generalized body ache, decreased urine output from suprapubic catheter.. Recently admitted 6/19-6/25 for AKI and UTI. CXR-thoracic compression fxs. And 7/11 for SOB and swelling. PMh includes chronic cystitis and urolithiasis, stage II CKD, CAD/CABG, chronic systolic CHF, HTN, HLD, mild dementia, A-fib, UTIs, sepsis.    PT Comments    Patient received in bed, wife present. Patient moaning reports pain "all over" specifies that his bottom hurts. Patient declined PT initially, but with encouragement agrees to participate. Requires assistance with LE ROM/strengthening exercises. Performs bed mobility with modified independence ( increased time and effort, use of bed rails). Requires min assist for transfers, able to walk a few feet from bed to recliner using RW and min assist with cues for safety. Patient demonstrates decreased activity tolerance from last week. Decreased motivation. Patient will continue to benefit from skilled PT to improve strength, safety and functional mobility.       Follow Up Recommendations  SNF     Equipment Recommendations  None recommended by PT    Recommendations for Other Services       Precautions / Restrictions Precautions Precautions: Fall Restrictions Weight Bearing Restrictions: No    Mobility  Bed Mobility Overal bed mobility: Modified Independent Bed Mobility: Supine to Sit     Supine to sit: Supervision     General bed mobility comments: requires increased time and effort , use of bed rails  Transfers Overall transfer level: Needs assistance Equipment used: Rolling walker (2 wheeled) Transfers: Sit to/from Stand Sit to Stand: Min assist         General transfer comment: increased time and effort needed to perform sit to stand  transfer  Ambulation/Gait Ambulation/Gait assistance: Min assist Gait Distance (Feet): 4 Feet Assistive device: Rolling walker (2 wheeled) Gait Pattern/deviations: Step-to pattern;Decreased step length - right;Decreased step length - left;Decreased stride length;Shuffle;Trunk flexed Gait velocity: decreased   General Gait Details: requires cues for safety with use of RW.   Stairs             Wheelchair Mobility    Modified Rankin (Stroke Patients Only)       Balance Overall balance assessment: Needs assistance Sitting-balance support: Single extremity supported;Feet supported Sitting balance-Leahy Scale: Fair     Standing balance support: Bilateral upper extremity supported Standing balance-Leahy Scale: Fair                              Cognition Arousal/Alertness: Awake/alert Behavior During Therapy: WFL for tasks assessed/performed Overall Cognitive Status: No family/caregiver present to determine baseline cognitive functioning Area of Impairment: Safety/judgement;Orientation;Following commands                 Orientation Level: Situation;Time;Disoriented to Current Attention Level: Sustained   Following Commands: Follows one step commands with increased time Safety/Judgement: Decreased awareness of safety Awareness: Emergent Problem Solving: Requires verbal cues;Requires tactile cues        Exercises Other Exercises Other Exercises: supine B LE strengthening/ROM exercises AAROM: ap, heel slides, hip abd/add x 10 reps each    General Comments        Pertinent Vitals/Pain Pain Assessment: Faces Faces Pain Scale: Hurts whole lot Pain Location: bottom, all over Pain Descriptors / Indicators: Discomfort;Sore;Grimacing;Moaning Pain Intervention(s): Limited activity within patient's tolerance;Monitored  during session;Repositioned    Home Living                      Prior Function            PT Goals (current goals can  now be found in the care plan section) Acute Rehab PT Goals Patient Stated Goal: to improve strength PT Goal Formulation: With patient Time For Goal Achievement: 10/26/18 Potential to Achieve Goals: Fair Progress towards PT goals: Not progressing toward goals - comment(patient limited by pain this visit)    Frequency    Min 3X/week      PT Plan Current plan remains appropriate    Co-evaluation              AM-PAC PT "6 Clicks" Mobility   Outcome Measure  Help needed turning from your back to your side while in a flat bed without using bedrails?: A Little Help needed moving from lying on your back to sitting on the side of a flat bed without using bedrails?: A Little Help needed moving to and from a bed to a chair (including a wheelchair)?: A Little Help needed standing up from a chair using your arms (e.g., wheelchair or bedside chair)?: A Little Help needed to walk in hospital room?: A Lot Help needed climbing 3-5 steps with a railing? : Total 6 Click Score: 15    End of Session Equipment Utilized During Treatment: Gait belt Activity Tolerance: Patient limited by pain;Patient limited by fatigue Patient left: in chair;with chair alarm set;with call bell/phone within reach;with family/visitor present Nurse Communication: Mobility status PT Visit Diagnosis: Muscle weakness (generalized) (M62.81);Difficulty in walking, not elsewhere classified (R26.2);Pain;Unsteadiness on feet (R26.81) Pain - Right/Left: (reports "all over")     Time: 1350-1415 PT Time Calculation (min) (ACUTE ONLY): 25 min  Charges:  $Gait Training: 8-22 mins $Therapeutic Exercise: 8-22 mins                     Makinzee Durley, PT, GCS 10/22/18,2:26 PM

## 2018-10-22 NOTE — Progress Notes (Signed)
Manufacturing engineer (ACC) GIP RN Note  Ronald [name] is under hospice services with [facility] with a hospice diagnosis of ESRD with heart failure, per Dr. Konrad Dolores.  Family activated EMS after pt was experiencing lower abdominal pain, wife hospice know after EMS was notified.  Ronald [name] is admitted with urosepsis.  This is a related hospital admission.   Discussion with PMT to assist with [place] conversations, report exchanged.  Wife is hesitant to make any decisions without the input of her children.  Per PMT, she is aware he is nearing EOL.  Wife has mentioned in the past that she felt like her son was having a hard time accepting his rapid decline.  She would like for him to be able to visit to see for himself.  PMT to meet with family tomorrow for follow up decisions based on conversations that were held today.  Pt with minimal complaints, resting.  Received PO pain medication and it seems to be effective.  V/S:  97.8 oral, 122/63, HR 71, RR 18, SPO2 99% RA I&O last 24 hours:  250/1200 Abnormal lab work:  BUN 104, Cr 5.48, phos 5, albumin 2.2, gfr 8.  CBC not obtained today.  Urine cx + for staph (7/22) IVs/PRNs:  Ancef 1g IV QD, vicodin 5/325mg  PO x 1, oxy IR 5mg  PO x 2  Problem list: Urosepsis:  MSSA bacteremia; suprapubic catheter exchanged; no growth on blood cultures; ancef 1g IV QD AKI on CKD: Cr 6.13>5.42>(7/27) 5.48  Family:  Attempted to contact [place] several times, left voice message.    IDT:  Discussed with ACC SW  D/C planning:  Home with hospice vs. Home with home health.  Some concerns if family and pt will choose to move towards comfort care, or continue aggressive treatments with PICC line and IV abx at home.    Goals of Care:  Unclear.  Currently a full code.  When pt is alone, he seems to understand DNR status but then defers to his wife and kids, ultimately his decision has been reverted back to full code.  PMT continues to assist family with understanding trajectory  of his illness.  Once pt is ready to d/c home, please use [facility] for transport as they contract this service for our active hospice pts.  Venia Carbon RN, BSN, [facility] Liaison (in [place]) [phone]

## 2018-10-22 NOTE — Care Management Important Message (Signed)
Important Message  Patient Details  Name: EUSEBIO BLAZEJEWSKI MRN: [id number] Date of Birth: 02/27/1928   Medicare Important Message Given:  Yes     [name] 10/22/2018, 1:36 PM

## 2018-10-22 NOTE — Consult Note (Signed)
Consultation Note Date: 10/22/2018   Patient Name: Ronald [name]  DOB: 06/07/1927  MRN: [id number]  Age / Sex: 85 y.o., male  PCP: Isaac Bliss, [name], MD Referring Physician: Roxan Hockey, MD  Reason for Consultation: Establishing goals of care  HPI/Patient Profile: 85 y.o. male  with past medical history of CHF (EF 30-35%), thoracic compression fractures, h/o prostate cancer, suprapubic catheter, recurrent UTI, CKD stage IV admitted on 10/17/2018 with fever, SOB, fatigue, and decreased urine output. Found to have MSSA UTI and bacteremia.   Clinical Assessment and Goals of Care: I met today at [name]'s bedside with he and his wife, Ronald [name]. Mr. Schnelle arouses but appears somewhat confused in conversation. He does not contribute to any decision making today when discussing code status or central line and IV antibiotics. He mainly tells [place] that he is hurting all over and just wants his pain to be better. This is all he wants.   I spent much time discussing with Ronald [name] these decisions and GOC. She tells me that prior to the past few weeks he had told her that he would not want to be kept alive on machines but would want to try if he were to have a heart attack. I explained that with his CHF and kidney disease that resuscitation is likely to cause further suffering and pain at the end of his life and would not be expected to bring him back to any QOL if he were to survive (which is also unlikely). I also explained that it is documented from his past admission that he desired DNR. Ronald [name] understands but said when he returned home he had DNR bracelet on wrist and did not understand what this meant. She questions if he completely understood the decision.   We further discussed IV antibiotics and Ronald [name] also understands that he is high risk for infection and that he could very likely have IV antibiotics and  become reinfected again. We also discussed that this is not expected to improve his QOL as he has multiple serious health conditions that are only expected to worsen with time. We discussed alternative of focusing on comfort care utilizing the help from hospice recognizing that time could be very limited.   Ronald [name] seems to be leaning more towards focus on comfort care but would like to involve their daughter and son in conversation. She wants everyone to have a say and have peace with this decision. All questions/concerns addressed.   **Update: Conference call planned with patient, wife, son, daughter for 10/23/18 at 0900 am.   Primary Decision Maker PATIENT - not decisional currently; wife makes decisions but requests involvement of their son and daughter    SUMMARY OF RECOMMENDATIONS   - Family meeting tomorrow to discuss decisions and [place]  Code Status/Advance Care Planning:  Full code - strongly encouraged consideration of DNR especially considering he agreed to this last admission   Symptom Management:   Chronic pain with thoracic compression fracture: Adding Tylenol 1000 mg TID. OxyIR 5 mg every  4 hours prn. Unfortunately he appears to be hurting when he is awake.   Palliative Prophylaxis:   Aspiration, Delirium Protocol, Frequent Pain Assessment and Turn Reposition  Additional Recommendations (Limitations, Scope, Preferences):  Avoid Hospitalization  Psycho-social/Spiritual:   Desire for further Chaplaincy support:yes  Additional Recommendations: Caregiving  Support/Resources, Education on Hospice and Grief/Bereavement Support  Prognosis:   Dependant on aggressiveness of care. If foregoes antibiotics < 2 weeks possible but if pursue IV antibiotics could be longer but still < 6 months.   Discharge Planning: To Be Determined      Primary Diagnoses: Present on Admission: . Sepsis ([place]) . HTN (hypertension) . HYPERCHOLESTEROLEMIA . Urinary tract infection . Urinary  tract infection associated with cystostomy catheter ([place]) . Coronary artery disease involving coronary bypass graft of native heart without angina pectoris . Debility . AKI (acute kidney injury) ([place]) . Chronic combined systolic (congestive) and diastolic (congestive) heart failure ([place]) . PAF (paroxysmal atrial fibrillation) ([place]) . Acute on chronic renal failure ([place]) . Anemia due to chronic kidney disease   I have reviewed the medical record, interviewed the patient and family, and examined the patient. The following aspects are pertinent.  Past Medical History:  Diagnosis Date  . Anemia 09/14/2018  . Arthritis    "joints ache" (01/02/2014)  . At risk for sleep apnea    STOP-BANG= 4    SENT TO PCP 12-23-2013  . Bladder calculi   . Bradycardia   . Chronic cystitis   . Chronic systolic CHF (congestive heart failure) (H[facility])    a. EF improved to 50-55% in 2018, previously 35-40%  . CKD (chronic kidney disease), stage II   . Coronary artery disease CARDIOLOGIST-  DR Angelena Form   a. CAD s/p CABG in 1989. b. redo bypass in 1997. c. last cath 2015 - med rx.  . Diverticulosis   . GERD (gastroesophageal reflux disease)   . History of Bell's palsy    RIGHT SIDE-- NO RESIDUAL  . Hx of dizziness   . Hyperlipidemia   . Hypertension    "not anymore" (01/02/2014)  . Ischemic cardiomyopathy   . Kidney stones "years ago"   "passed them"  . Melanoma of ear ([place])    "right"  . Mild dementia ([place])   . Myocardial infarction ([place]) 1986; 1997  . Nocturia   . OAB (overactive bladder)   . Persistent atrial fibrillation   . Prostate cancer ([place]) 1998   S/P  [place]; [place]  . S/P CABG (coronary artery bypass graft)    [place]  . Sepsis due to urinary tract infection ([place])   . Urethral stricture   . UTI (urinary tract infection) 09/2015  . Wears hearing aid    bilateral  . Wears partial dentures    Social History   Socioeconomic History  .  Marital status: Married    Spouse name: Not on file  . Number of children: Not on file  . Years of education: Not on file  . Highest education level: Not on file  Occupational History  . Not on file  Social Needs  . Financial resource strain: Not on file  . Food insecurity    Worry: Not on file    Inability: Not on file  . Transportation needs    Medical: Not on file    Non-medical: Not on file  Tobacco Use  . Smoking status: Former Smoker    Years: 40.00  Types: Cigars    Quit date: 12/24/1983    Years since quitting: 34.8  . Smokeless tobacco: Former Systems developer    Types: Chew    Quit date: 08/28/2013  . Tobacco comment: smoked a pipe  Substance and Sexual Activity  . Alcohol use: No  . Drug use: No  . Sexual activity: Not on file  Lifestyle  . Physical activity    Days per week: Not on file    Minutes per session: Not on file  . Stress: Not on file  Relationships  . Social Herbalist on phone: Not on file    Gets together: Not on file    Attends religious service: Not on file    Active member of club or organization: Not on file    Attends meetings of clubs or organizations: Not on file    Relationship status: Not on file  Other Topics Concern  . Not on file  Social History Narrative   Was a Clinical cytogeneticist   Was in the [employer] x 4 years   Father died and felt he needed to come home to help his mother    Was asked to rejoin      2 children; boy and girl   3 grand children   One grandson in [place], one grandson in [place];    [place] a job as Nurse, adult dtr; [place].    Family History  Problem Relation Age of Onset  . Heart disease Mother   . Diabetes Father   . Heart disease Brother    Scheduled Meds: . apixaban  2.5 mg Oral BID  . omeprazole  40 mg Oral Daily  . polyethylene glycol  17 g Oral BID  . senna-docusate  1 tablet Oral BID  . sodium bicarbonate  650 mg Oral TID  . sodium chloride flush  3 mL Intravenous Once    Continuous Infusions: .  ceFAZolin (ANCEF) IV 1 g (10/21/18 1248)   PRN Meds:.acetaminophen **OR** acetaminophen, alum & mag hydroxide-simeth, flavoxATE, HYDROcodone-acetaminophen, ondansetron **OR** ondansetron (ZOFRAN) IV, oxyCODONE, sorbitol Allergies  Allergen Reactions  . Lidocaine Shortness Of Breath and Swelling    Mouth swelling   . Pantoprazole Other (See Comments)    Made the patient lightheaded and dizzy  . Nitrofurantoin Hives and Other (See Comments)    Also made the patient lightheaded and dizzy  . Sulfa Antibiotics Hives and Itching  . Tape Other (See Comments)    TAPE WILL TEAR THE SKIN!!!   Review of Systems  Unable to perform ROS Constitutional:       Complains of generalized pain    Physical Exam Vitals signs and nursing note reviewed.  Constitutional:      General: He is not in acute distress.    Comments: Elderly, frail, sleepy  Cardiovascular:     Rate and Rhythm: Normal rate.  Pulmonary:     Effort: Pulmonary effort is normal. No tachypnea, accessory muscle usage or respiratory distress.  Abdominal:     General: Abdomen is flat.  Neurological:     Comments: Arouses but briefly and with confusion; unable to participate in [place] conversation or decision-making     Vital Signs: BP 122/63 (BP Location: Left Arm)   Pulse 71   Temp 97.8 F (36.6 C) (Oral)   Resp 18   Ht _0  (1.626 m)   Wt 63.1 kg   SpO2 99%   BMI 23.88 kg/m  Pain  Scale: 0-10   Pain Score: Asleep   SpO2: SpO2: 99 % O2 Device:SpO2: 99 % O2 Flow Rate: .   IO: Intake/output summary: No intake or output data in the 24 hours ending 10/22/18 1324  LBM: Last BM Date: 10/21/18 Baseline Weight: Weight: 66 kg Most recent weight: Weight: 63.1 kg     Palliative Assessment/Data:     Time In: 1230 Time Out: 1340 Time Total: 70 min Greater than 50%  of this time was spent counseling and coordinating care related to the above assessment and plan.  Signed by: Vinie Sill, NP  Palliative Medicine Team Pager # [phone] (M-F 8a-5p) Team Phone # [phone] (Nights/Weekends)

## 2018-10-22 NOTE — Progress Notes (Signed)
Patient Demographics:    Ronald [name], is a 85 y.o. male, DOB - 06-15-1927, VHQ:[id number]  Admit date - 10/17/2018   Admitting Physician Toy Baker, MD  Outpatient Primary MD for the patient is Isaac Bliss, [name], MD  LOS - 4   Chief Complaint  Patient presents with   Shortness of Breath        Subjective:    Ronald [name] today has no fevers, no emesis,  No chest pain, resting comfortably,  Assessment  & Plan :    Active Problems:   HYPERCHOLESTEROLEMIA   HTN (hypertension)   PROSTATE CANCER, HX OF   Urinary tract infection   Urinary tract infection associated with cystostomy catheter (HCC)   Coronary artery disease involving coronary bypass graft of native heart without angina pectoris   History of prostate cancer   Suprapubic catheter ([place])   Debility   AKI (acute kidney injury) ([place])   Chronic combined systolic (congestive) and diastolic (congestive) heart failure (HCC)   PAF (paroxysmal atrial fibrillation) (HCC)   Acute on chronic renal failure (HCC)   Anemia due to chronic kidney disease   Sepsis ([place])   MSSA bacteremia  Brief Summary Ronald [name] is an 85 year old male with past medical history relevant for h/o prostate cancer, CAD s/p CABG in 1989 and redo bypass in 1997, HTN, HLD, mild dementia, prostate cancer and atrial fibrillation,history of kidney stones and urethral stricture  sp suprapubic catheter, GERD admitted on 10/17/2018 with generalized weakness, fever, and chills and found to have MSSA bacteremia as well as MSSA in his urine.  TTE without evidence of endocarditis. Previously on hospice and determining goals of care   A/p.   MSSA bacteremia -likely urinary source from suprapubic catheter.  Repeat blood cultures without growth to date.  Discussed with Mrs. Mccarthy recommendations for treatment of 6 weeks of IV antibiotics with cefazolin and need for PICC  line depending upon goals of care.  Continue current dose of cefazolin. -Patient met sepsis criteria on admission with fevers, leukocytosis and worsening renal function  Acute kidney injury on chronic kidney disease IV -creatinine stable at 5.48, which is close to patient's baseline, on admission creatinine was 7.92.  Nephrology previously indicated patient would not want dialysis.  Potassium levels within normal ranges.  --- renally adjust medications, avoid nephrotoxic agents / dehydration / hypotension   Recurrent catheter associated UTI--- urine culture with MSSA, suprapubic catheter last changed 10/19/2018--- treat with IV Ancef as above  Chronic A. Fib--- continue Eliquis 2.5 mg twice daily for anticoagulation, not requiring rate control agents  H/o CAD--- asymptomatic, chest pain-free, Lipitor and Imdur on hold  Anemia of CKD--- hemoglobin is stable at 9.7,  HFrEF--- combined systolic and diastolic dysfunction CHF with EF in the 40 to 45% range based on echo from 08/29/2018--monitor fluid status carefully, use diuretics as needed  Social/Ethics--- prior to admission patient was previously enrolled in hospice,,,   hospice and palliative care consult appreciated Patient is currently full code   Disposition/Need for in-Hospital Stay- patient unable to be discharged at this time due to acquiring IV antibiotics depending on goals of care may need PICC line and weeks of IV antibiotics*  Code Status : FULL   Family Communication:  (patient  is alert, awake and coherent) Discussed with wife  Disposition Plan  : TBD  Consults  :  ID/hospice and palliative medicine  DVT Prophylaxis  : Eliquis  Lab Results  Component Value Date   PLT 183 10/21/2018    Inpatient Medications  Scheduled Meds:  acetaminophen  1,000 mg Oral TID   apixaban  2.5 mg Oral BID   omeprazole  40 mg Oral Daily   polyethylene glycol  17 g Oral BID   senna-docusate  1 tablet Oral BID   sodium  bicarbonate  650 mg Oral TID   sodium chloride flush  3 mL Intravenous Once   Continuous Infusions:   ceFAZolin (ANCEF) IV 1 g (10/22/18 1733)   PRN Meds:.alum & mag hydroxide-simeth, flavoxATE, ondansetron **OR** ondansetron (ZOFRAN) IV, oxyCODONE, sorbitol    Anti-infectives (From admission, onward)   Start     Dose/Rate Route Frequency Ordered Stop   10/20/18 1400  ceFAZolin (ANCEF) IVPB 1 g/50 mL premix     1 g 100 mL/hr over 30 Minutes Intravenous Every 24 hours 10/20/18 1325     10/18/18 1400  DAPTOmycin (CUBICIN) 500 mg in sodium chloride 0.9 % IVPB  Status:  Discontinued     500 mg 220 mL/hr over 30 Minutes Intravenous Every 48 hours 10/18/18 1320 10/20/18 1318   10/17/18 2200  meropenem (MERREM) 1 g in sodium chloride 0.9 % 100 mL IVPB  Status:  Discontinued     1 g 200 mL/hr over 30 Minutes Intravenous Every 24 hours 10/17/18 2040 10/18/18 1320   10/17/18 1800  aztreonam (AZACTAM) 1 g in sodium chloride 0.9 % 100 mL IVPB     1 g 200 mL/hr over 30 Minutes Intravenous  Once 10/17/18 1756 10/17/18 2111        Objective:   Vitals:   10/21/18 1515 10/21/18 2030 10/22/18 0621 10/22/18 1531  BP: 111/66 (!) 109/48 122/63 112/85  Pulse: 80 72 71 76  Resp:  18    Temp: 98.2 F (36.8 C) 97.6 F (36.4 C) 97.8 F (36.6 C) 97.9 F (36.6 C)  TempSrc: Oral Oral Oral Oral  SpO2: 97% 99% 99% 99%  Weight:   63.1 kg   Height:        Wt Readings from Last 3 Encounters:  10/22/18 63.1 kg  10/07/18 65.1 kg  10/01/18 70.8 kg    No intake or output data in the 24 hours ending 10/22/18 1840   Physical Exam  Gen:- Awake Alert, in no acute distress HEENT:- [place].AT, No sclera icterus Neck-Supple Neck,No JVD,.  Ears- HOH Lungs-  CTAB , fair symmetrical air movement CV- S1, S2 normal, regular , prior sternotomy scar Abd-  +ve B.Sounds, Abd Soft, No tenderness,    Extremity/Skin:-  pedal pulses present  Psych-affect is appropriate, oriented   Neuro-generalized weakness, no  new focal deficits, no tremors GU--suprapubic catheter  Data Review:   Micro Results Recent Results (from the past 240 hour(s))  Blood culture (routine x 2)     Status: Abnormal   Collection Time: 10/17/18  5:07 PM   Specimen: BLOOD RIGHT FOREARM  Result Value Ref Range Status   Specimen Description BLOOD RIGHT FOREARM  Final   Special Requests   Final    BOTTLES DRAWN AEROBIC AND ANAEROBIC Blood Culture adequate volume   Culture  Setup Time   Final    GRAM POSITIVE COCCI IN CLUSTERS IN BOTH AEROBIC AND ANAEROBIC BOTTLES CRITICAL RESULT CALLED TO, READ BACK BY AND VERIFIED WITH:  J. FRENS,PHARMD 1258 10/18/2018 TRosalia Hammers Performed at [facility], [place] [address], [place], [place] [address]    Culture STAPHYLOCOCCUS AUREUS (A)  Final   Report Status 10/20/2018 FINAL  Final   Organism ID, Bacteria STAPHYLOCOCCUS AUREUS  Final      Susceptibility   Staphylococcus aureus - MIC*    CIPROFLOXACIN >=8 RESISTANT Resistant     ERYTHROMYCIN >=8 RESISTANT Resistant     GENTAMICIN <=0.5 SENSITIVE Sensitive     OXACILLIN 0.5 SENSITIVE Sensitive     TETRACYCLINE <=1 SENSITIVE Sensitive     VANCOMYCIN <=0.5 SENSITIVE Sensitive     TRIMETH/SULFA <=10 SENSITIVE Sensitive     CLINDAMYCIN <=0.25 SENSITIVE Sensitive     RIFAMPIN <=0.5 SENSITIVE Sensitive     Inducible Clindamycin NEGATIVE Sensitive     * STAPHYLOCOCCUS AUREUS  Blood Culture ID Panel (Reflexed)     Status: Abnormal   Collection Time: 10/17/18  5:07 PM  Result Value Ref Range Status   Enterococcus species NOT DETECTED NOT DETECTED Final   Listeria monocytogenes NOT DETECTED NOT DETECTED Final   Staphylococcus species DETECTED (A) NOT DETECTED Final    Comment: CRITICAL RESULT CALLED TO, READ BACK BY AND VERIFIED WITH: J. FRENS,PHARMD 1258 10/18/2018 T. TYSOR    Staphylococcus aureus (BCID) DETECTED (A) NOT DETECTED Final    Comment: Methicillin (oxacillin)-resistant Staphylococcus aureus (MRSA). MRSA is predictably  resistant to beta-lactam antibiotics (except ceftaroline). Preferred therapy is vancomycin unless clinically contraindicated. Patient requires contact precautions if  hospitalized. CRITICAL RESULT CALLED TO, READ BACK BY AND VERIFIED WITH: J. FRENS,PHARMD 1258 10/18/2018 T. TYSOR    Methicillin resistance DETECTED (A) NOT DETECTED Final    Comment: CRITICAL RESULT CALLED TO, READ BACK BY AND VERIFIED WITH: J. FRENS,PHARMD 1258 10/18/2018 T. TYSOR    Streptococcus species NOT DETECTED NOT DETECTED Final   Streptococcus agalactiae NOT DETECTED NOT DETECTED Final   Streptococcus pneumoniae NOT DETECTED NOT DETECTED Final   Streptococcus pyogenes NOT DETECTED NOT DETECTED Final   Acinetobacter baumannii NOT DETECTED NOT DETECTED Final   Enterobacteriaceae species NOT DETECTED NOT DETECTED Final   Enterobacter cloacae complex NOT DETECTED NOT DETECTED Final   Escherichia coli NOT DETECTED NOT DETECTED Final   Klebsiella oxytoca NOT DETECTED NOT DETECTED Final   Klebsiella pneumoniae NOT DETECTED NOT DETECTED Final   Proteus species NOT DETECTED NOT DETECTED Final   Serratia marcescens NOT DETECTED NOT DETECTED Final   Haemophilus influenzae NOT DETECTED NOT DETECTED Final   Neisseria meningitidis NOT DETECTED NOT DETECTED Final   Pseudomonas aeruginosa NOT DETECTED NOT DETECTED Final   Candida albicans NOT DETECTED NOT DETECTED Final   Candida glabrata NOT DETECTED NOT DETECTED Final   Candida krusei NOT DETECTED NOT DETECTED Final   Candida parapsilosis NOT DETECTED NOT DETECTED Final   Candida tropicalis NOT DETECTED NOT DETECTED Final    Comment: Performed at [facility], [place] [address], [place], [place] [address]  SARS Coronavirus 2 (CEPHEID- Performed in [facility] lab), Hosp Order     Status: None   Collection Time: 10/17/18  5:19 PM   Specimen: Nasopharyngeal  Result Value Ref Range Status   SARS Coronavirus 2 NEGATIVE NEGATIVE Final    Comment: (NOTE) If result  is NEGATIVE SARS-CoV-2 target nucleic acids are NOT DETECTED. The SARS-CoV-2 RNA is generally detectable in upper and lower  respiratory specimens during the acute phase of infection. The lowest  concentration of SARS-CoV-2 viral copies this assay can detect is 250  copies / mL. A negative result does not preclude SARS-CoV-2 infection  and should not be used as the sole basis for treatment or other  patient management decisions.  A negative result may occur with  improper specimen collection / handling, submission of specimen other  than nasopharyngeal swab, presence of viral mutation(s) within the  areas targeted by this assay, and inadequate number of viral copies  (<250 copies / mL). A negative result must be combined with clinical  observations, patient history, and epidemiological information. If result is POSITIVE SARS-CoV-2 target nucleic acids are DETECTED. The SARS-CoV-2 RNA is generally detectable in upper and lower  respiratory specimens dur ing the acute phase of infection.  Positive  results are indicative of active infection with SARS-CoV-2.  Clinical  correlation with patient history and other diagnostic information is  necessary to determine patient infection status.  Positive results do  not rule out bacterial infection or co-infection with other viruses. If result is PRESUMPTIVE POSTIVE SARS-CoV-2 nucleic acids MAY BE PRESENT.   A presumptive positive result was obtained on the submitted specimen  and confirmed on repeat testing.  While 2019 novel coronavirus  (SARS-CoV-2) nucleic acids may be present in the submitted sample  additional confirmatory testing may be necessary for epidemiological  and / or clinical management purposes  to differentiate between  SARS-CoV-2 and other Sarbecovirus currently known to infect humans.  If clinically indicated additional testing with an alternate test  methodology [phone]) is advised. The SARS-CoV-2 RNA is generally    detectable in upper and lower respiratory sp ecimens during the acute  phase of infection. The expected result is Negative. Fact Sheet for Patients:  StrictlyIdeas.no Fact Sheet for Healthcare Providers: BankingDealers.co.za This test is not yet approved or cleared by the [place] FDA and has been authorized for detection and/or diagnosis of SARS-CoV-2 by FDA under an Emergency Use Authorization (EUA).  This EUA will remain in effect (meaning this test can be used) for the duration of the COVID-19 declaration under Section 564(b)(1) of the Act, 21 U.S.C. section 360bbb-3(b)(1), unless the authorization is terminated or revoked sooner. Performed at [facility], [place] [address], [place], [place] [address]   Urine culture     Status: Abnormal   Collection Time: 10/17/18  5:21 PM   Specimen: Urine, Random  Result Value Ref Range Status   Specimen Description URINE, RANDOM  Final   Special Requests   Final    NONE Performed at [facility], [place] [address] [place], [place] [address]    Culture >=100,000 COLONIES/mL STAPHYLOCOCCUS AUREUS (A)  Final   Report Status 10/19/2018 FINAL  Final   Organism ID, Bacteria STAPHYLOCOCCUS AUREUS (A)  Final      Susceptibility   Staphylococcus aureus - MIC*    CIPROFLOXACIN 4 RESISTANT Resistant     GENTAMICIN <=0.5 SENSITIVE Sensitive     NITROFURANTOIN <=16 SENSITIVE Sensitive     OXACILLIN 0.5 SENSITIVE Sensitive     TETRACYCLINE <=1 SENSITIVE Sensitive     VANCOMYCIN <=0.5 SENSITIVE Sensitive     TRIMETH/SULFA <=10 SENSITIVE Sensitive     CLINDAMYCIN <=0.25 SENSITIVE Sensitive     RIFAMPIN <=0.5 SENSITIVE Sensitive     Inducible Clindamycin NEGATIVE Sensitive     * >=100,000 COLONIES/mL STAPHYLOCOCCUS AUREUS  Blood culture (routine x 2)     Status: Abnormal   Collection Time: 10/17/18  5:23 PM   Specimen: BLOOD  Result Value Ref Range Status   Specimen Description BLOOD  LEFT ANTECUBITAL  Final   Special Requests   Final    BOTTLES DRAWN AEROBIC AND ANAEROBIC Blood Culture results may not be optimal due to an inadequate volume of blood received in culture bottles   Culture  Setup Time   Final    GRAM POSITIVE COCCI IN CLUSTERS IN BOTH AEROBIC AND ANAEROBIC BOTTLES CRITICAL RESULT CALLED TO, READ BACK BY AND VERIFIED WITH: J. FRENS,PHARMD 1258 10/18/2018 T. TYSOR    Culture (A)  Final    STAPHYLOCOCCUS AUREUS SUSCEPTIBILITIES PERFORMED ON PREVIOUS CULTURE WITHIN THE LAST 5 DAYS. Performed at [facility], [place] [address] [place], [place] [address]    Report Status 10/20/2018 FINAL  Final  Culture, blood (routine x 2)     Status: None (Preliminary result)   Collection Time: 10/19/18  4:44 AM   Specimen: BLOOD  Result Value Ref Range Status   Specimen Description BLOOD LEFT ARM  Final   Special Requests   Final    BOTTLES DRAWN AEROBIC ONLY Blood Culture adequate volume   Culture   Final    NO GROWTH 3 DAYS Performed at [facility], [place] [address] [place], [place] [address]    Report Status PENDING  Incomplete  Culture, blood (routine x 2)     Status: None (Preliminary result)   Collection Time: 10/19/18  4:44 AM   Specimen: BLOOD  Result Value Ref Range Status   Specimen Description BLOOD LEFT ARM  Final   Special Requests   Final    BOTTLES DRAWN AEROBIC ONLY Blood Culture adequate volume   Culture   Final    NO GROWTH 3 DAYS Performed at [facility], [address], [place], [place] [address]    Report Status PENDING  Incomplete    Radiology Reports Ct Abdomen Pelvis Wo Contrast  Result Date: 10/17/2018 CLINICAL DATA:  Fever, body aches, and cystitis. Evaluate for pyelonephritis. EXAM: CT ABDOMEN AND PELVIS WITHOUT CONTRAST TECHNIQUE: Multidetector CT imaging of the abdomen and pelvis was performed following the standard protocol without IV contrast. COMPARISON:  Renal ultrasound dated October 04, 2018. CT abdomen pelvis  dated September 15, 2018. FINDINGS: Lower chest: New trace left pleural effusion. Unchanged cardiomegaly and bibasilar scarring. Hepatobiliary: No focal liver abnormality is seen. Status post cholecystectomy. No biliary dilatation. Pancreas: Atrophic. No ductal dilatation or surrounding inflammatory changes. Spleen: Normal in size without focal abnormality. Adrenals/Urinary Tract: The adrenal glands are unremarkable. Unchanged moderate to severe bilateral hydroureteronephrosis. Unchanged 5.8 cm right renal simple cyst. The bladder remains decompressed by a suprapubic catheter. Stomach/Bowel: Stomach is within normal limits. Appendix appears normal. No evidence of bowel wall thickening, distention, or inflammatory changes. Unchanged sigmoid diverticulosis. Vascular/Lymphatic: Aortic atherosclerosis. No enlarged abdominal or pelvic lymph nodes. Reproductive: Prostatic radiation seed implants again noted. Other: Unchanged small fat containing bilateral inguinal hernias. No free fluid or pneumoperitoneum. Musculoskeletal: No acute or significant osseous findings. IMPRESSION: 1. Evaluation for pyelonephritis is limited without intravenous contrast. Unchanged moderate to severe bilateral hydroureteronephrosis. 2. New trace left pleural effusion. 3.  Aortic atherosclerosis (ICD10-I70.0). Electronically Signed   By: Titus Dubin M.D.   On: 10/17/2018 19:37   Dg Chest 2 View  Result Date: 10/04/2018 CLINICAL DATA:  SOB,SWELLING IN LEGS,FLUID OVERLOAD,HX CHF,HTN,MI EXAM: CHEST - 2 VIEW COMPARISON:  Prior studies including 09/14/2018 FINDINGS: Median sternotomy and CABG. The heart is enlarged. There is shallow lung inflation. Minimal atelectasis is identified at both lung bases. There are no focal consolidations, pleural effusions,  or pulmonary edema. Numerous thoracic compression fractures. IMPRESSION: 1. Cardiomegaly. 2. Bibasilar atelectasis. 3. Lungs otherwise clear. Electronically Signed   By: Nolon Nations M.D.   On:  10/04/2018 12:59   US Renal  Result Date: 10/04/2018 CLINICAL DATA:  Acute renal failure EXAM: RENAL / URINARY TRACT ULTRASOUND COMPLETE COMPARISON:  CT from 09/15/2018 FINDINGS: Right Kidney: Renal measurements: 12.3 x 4.9 x 6.1 cm = volume: 198 mL. Moderate hydronephrosis is noted. There is a 5.6 cm cyst identified similar to that seen on prior CT examination. Left Kidney: Renal measurements: 11.7 x 5.8 x 5.6 cm = volume: 201 mL. Moderate hydronephrosis is noted similar to that seen on the right. Bladder: Bladder is decompressed IMPRESSION: Bilateral hydronephrosis is noted similar to that seen on prior CT examination. Right renal cyst stable in appearance. Electronically Signed   By: [name] Catalina M.D.   On: 10/04/2018 23:20   Dg Chest Portable 1 View  Result Date: 10/17/2018 CLINICAL DATA:  Fever today, shortness of breath and body aches for 3 months, history hypertension, coronary artery disease post MI, CHF EXAM: PORTABLE CHEST 1 VIEW COMPARISON:  Portable exam 1659 hours compared to 10/04/2018 FINDINGS: Enlargement of cardiac silhouette post CABG. Mediastinal contours and pulmonary vascularity normal. Mild chronic elevation of LEFT diaphragm with mild LEFT basilar subsegmental atelectasis. Upper lungs clear. No pleural effusion or pneumothorax. Bones demineralized. IMPRESSION: Subsegmental atelectasis LEFT base. Enlargement of cardiac silhouette post CABG. Electronically Signed   By: Lavonia Dana M.D.   On: 10/17/2018 17:33     CBC Recent Labs  Lab 10/17/18 1707 10/17/18 1732 10/18/18 0320 10/19/18 0444 10/20/18 0352 10/21/18 0722  WBC 27.1*  --  21.7* 18.5* 16.2* 13.6*  HGB 9.0* 8.5* 8.7* 9.0* 8.9* 9.7*  HCT 27.0* 25.0* 25.9* 27.1* 26.3* 28.8*  PLT 131*  --  125* 122* 139* 183  MCV 95.4  --  93.8 94.8 91.6 92.9  MCH 31.8  --  31.5 31.5 31.0 31.3  MCHC 33.3  --  33.6 33.2 33.8 33.7  RDW 15.4  --  15.4 15.5 15.6* 15.8*  LYMPHSABS 0.5*  --   --   --   --   --   MONOABS 0.8  --   --    --   --   --   EOSABS 0.0  --   --   --   --   --   BASOSABS 0.0  --   --   --   --   --     Chemistries  Recent Labs  Lab 10/18/18 0320 10/19/18 0444 10/20/18 0352 10/21/18 0722 10/22/18 0418  NA 135 138 136 137 137  K 4.7 4.1 3.3* 3.9 4.2  CL 99 107 107 108 107  CO2 16* 16* 14* 19* 17*  GLUCOSE 146* 130* 126* 142* 145*  BUN 120* 117* 112* 106* 104*  CREATININE 7.92* 6.99* 6.13* 5.47* 5.48*  CALCIUM 8.6* 8.4* 8.3* 8.2* 8.2*  MG 2.1  --   --   --   --   AST 41  --   --   --   --   ALT 23  --   --   --   --   ALKPHOS 142*  --   --   --   --   BILITOT 0.6  --   --   --   --    ------------------------------------------------------------------------------------------------------------------ No results for input(s): CHOL, HDL, LDLCALC, TRIG, CHOLHDL, LDLDIRECT in the last 72 hours.  Lab Results  Component Value Date   HGBA1C 6.0 (H) 08/27/2013   ------------------------------------------------------------------------------------------------------------------ No results for input(s): TSH, T4TOTAL, T3FREE, THYROIDAB in the last 72 hours.  Invalid input(s): FREET3 ------------------------------------------------------------------------------------------------------------------ No results for input(s): VITAMINB12, FOLATE, FERRITIN, TIBC, IRON, RETICCTPCT in the last 72 hours.  Coagulation profile No results for input(s): INR, PROTIME in the last 168 hours.  No results for input(s): DDIMER in the last 72 hours.  Cardiac Enzymes No results for input(s): CKMB, TROPONINI, MYOGLOBIN in the last 168 hours.  Invalid input(s): CK ------------------------------------------------------------------------------------------------------------------    Component Value Date/Time   BNP 545.1 (H) 10/04/2018 1252     Roxan Hockey M.D on 10/22/2018 at 6:40 PM  Go to [email or web address] - for contact info  [facility] - Office  [phone]

## 2018-10-23 ENCOUNTER — Telehealth: Payer: Self-pay | Admitting: *Deleted

## 2018-10-23 DIAGNOSIS — Z66 Do not resuscitate: Secondary | ICD-10-CM

## 2018-10-23 LAB — RENAL FUNCTION PANEL
Albumin: 2.1 g/dL — ABNORMAL LOW (ref 3.5–5.0)
Anion gap: 14 (ref 5–15)
BUN: 111 mg/dL — ABNORMAL HIGH (ref 8–23)
CO2: 17 mmol/L — ABNORMAL LOW (ref 22–32)
Calcium: 8.3 mg/dL — ABNORMAL LOW (ref 8.9–10.3)
Chloride: 103 mmol/L (ref 98–111)
Creatinine, Ser: 5.43 mg/dL — ABNORMAL HIGH (ref 0.61–1.24)
GFR calc Af Amer: 10 mL/min — ABNORMAL LOW (ref >60)
GFR calc non Af Amer: 9 mL/min — ABNORMAL LOW (ref >60)
Glucose, Bld: 125 mg/dL — ABNORMAL HIGH (ref 70–99)
Phosphorus: 4.5 mg/dL (ref 2.5–4.6)
Potassium: 4.4 mmol/L (ref 3.5–5.1)
Sodium: 134 mmol/L — ABNORMAL LOW (ref 135–145)

## 2018-10-23 MED ORDER — OXYCODONE HCL 5 MG PO TABS
2.5000 mg | ORAL_TABLET | Freq: Four times a day (QID) | ORAL | Status: DC
  Administered 2018-10-23 – 2018-10-25 (×8): 2.5 mg via ORAL
  Filled 2018-10-23 (×9): qty 1

## 2018-10-23 MED ORDER — SENNOSIDES-DOCUSATE SODIUM 8.6-50 MG PO TABS
2.0000 | ORAL_TABLET | Freq: Two times a day (BID) | ORAL | Status: DC
  Administered 2018-10-23 – 2018-10-24 (×2): 2 via ORAL
  Filled 2018-10-23 (×2): qty 2

## 2018-10-23 MED ORDER — OXYCODONE HCL 5 MG PO TABS
2.5000 mg | ORAL_TABLET | ORAL | Status: DC | PRN
  Administered 2018-10-24: 2.5 mg via ORAL

## 2018-10-23 MED ORDER — OXYCODONE HCL 5 MG PO TABS: 2.5000 mg | ORAL_TABLET | ORAL | Status: DC | PRN

## 2018-10-23 NOTE — Progress Notes (Signed)
Palliative:  HPI: 85 y.o. male  with past medical history of CHF (EF 30-35%), thoracic compression fractures, h/o prostate cancer, suprapubic catheter, recurrent UTI, CKD stage IV admitted on 10/17/2018 with fever, SOB, fatigue, and decreased urine output. Found to have MSSA UTI and bacteremia. Palliative care to assist as family struggling with decision for continued aggressive care vs comfort care.   I met today at Mr. Huesca's bedside. He is awake and alert but struggles to maintain on task to participate in [place]. He gets very distracted to get a straw for his drink but then at times he becomes very frustrated and tearful and comments on wanting to go home and die.   I spoke with wife [name], daughter Santiago Glad, and son Shanon Brow via telephone today (Mr. Spainhour was present for conversation in his room). We discussed in detail [place] with chronic pain and worsening weakness over past months. We discussed recurrent infections, current bacteremia and recommendation for 6 weeks IV antibiotics, along with underlying renal failure and heart failure that is worsening. We discussed po antibiotics and that these would be inferior to treat his infection. We discussed potential for stents or PCN but I reminded family that if we do not pursue IV antibiotics then he would have no benefit of going through any urological procedures. We discussed in depth overall QOL declining and suffering and outlined aggressive care with IV antibiotics vs comfort care pathways. We discussed hospice at home and hospice facility. Family is struggling with decisions and plan to reach out to urology and PCP for recommendations.   We also discussed code status and resuscitation in depth. [name] read aloud Mr. Krakowski's Living Will which indicates desire for natural death. After discussing the expectations of a resuscitation effort for Mr. Ruddock family have agreed on DNR.   I discussed with PCP, Dr. Jerilee Hoh, who indicates that Mr. Hitchens told her  at his last visit that he does not want to live like this anymore. I f/u with Santiago Glad later to discuss this and his QOL and the fact that I do believe Mr. Pollitt has been telling them what he wants when he comments like this. Santiago Glad tells me that they were able to have a good discussion with urologist about risks vs benefits of nephrostomy tubes. They understood that it may alleviate some of his discomfort but the tubes themselves come with discomfort and risk of infection. All questions/concerns addressed. Emotional support provided.   I believe that family is leaning towards comfort care but they are struggling to admit to each other that they think comfort may be best and do not want to feel like they are giving up. I encouraged them to continue discussing options with each other. I will continue to follow and will continue conversation tomorrow.   Exam: Alert, confused. No distress. Breathing regular, unlabored. Moans in pain often when awake (pain generalized). Abd soft. Generalized weakness.   Plan: - Continued family discussions.  - Uncontrolled pain: OxyIR 2.5 mg po every 6 hours scheduled. OxyIR 2.5 mg po every 3 hours prn. Tylenol 1000 mg po TID.  - Bowel Regimen: Senokot-S increased 2 tablets BID. Miralax BID (not taking consistently).   0900-1030, [id number] 233 min  Vinie Sill, NP Palliative Medicine Team Pager [phone] (Please see [email or web address] for schedule) Team Phone [phone]    Greater than 50%  of this time was spent counseling and coordinating care related to the above assessment and plan   The above conversation was completed via telephone due to  the visitor restrictions during the COVID-19 pandemic. Thorough chart review and discussion with necessary members of the care team was completed as part of assessment.

## 2018-10-23 NOTE — Telephone Encounter (Signed)
Copied from [place] [phone]. Topic: General - Other >> Oct 23, 2018  3:24 PM Leward Quan A wrote: Reason for CRM: Patient daughter Uvaldo Bristle called to ask if her their mom and brother would like to set up a conference call with Dr Jerilee Hoh. Patient is in the hospital and there are some things they need to speak with Dr Jerilee Hoh about and would like to speak to her before he get out hopefully.  Please call Santiago Glad at Ph# [phone]

## 2018-10-23 NOTE — Telephone Encounter (Signed)
Please schedule for tomorrow. Can open slots if nothing available. Thanks.

## 2018-10-23 NOTE — Progress Notes (Signed)
Physical Therapy Treatment Patient Details Name: Ronald [name] MRN: [id number] DOB: 07/20/1927 Today's Date: 10/23/2018    History of Present Illness Patient is a 85 y/o male who presents with fever fatigue and generalized body ache, decreased urine output from suprapubic catheter.. Recently admitted 6/19-6/25 for AKI and UTI. CXR-thoracic compression fxs. And 7/11 for SOB and swelling. PMh includes chronic cystitis and urolithiasis, stage II CKD, CAD/CABG, chronic systolic CHF, HTN, HLD, mild dementia, A-fib, UTIs, sepsis.    PT Comments    Patient received in bed, asking when he will get out of here. Doesn't know why we are keeping him. Agrees to PT. Patient requires supervision for bed mobility with use of rails and increased time. Patient performs transfers with min guard and cues for safety. He attempted to walk further today, but then could not stating he was too weak. Patient ambulated 4 feet to recliner using RW and min guard.  Requires increased time for all mobility. Patient will continue to be followed acutely for improved mobility, and strength for maximal independence.        Follow Up Recommendations  SNF     Equipment Recommendations  None recommended by PT    Recommendations for Other Services       Precautions / Restrictions Precautions Precautions: Fall Restrictions Weight Bearing Restrictions: No    Mobility  Bed Mobility Overal bed mobility: Modified Independent Bed Mobility: Supine to Sit     Supine to sit: Supervision     General bed mobility comments: requires increased time and effort , use of bed rails  Transfers Overall transfer level: Needs assistance Equipment used: Rolling walker (2 wheeled) Transfers: Sit to/from Stand   Stand pivot transfers: Min guard       General transfer comment: increased time and effort needed to perform sit to stand transfer  Ambulation/Gait Ambulation/Gait assistance: Min guard Gait Distance (Feet): 4  Feet Assistive device: Rolling walker (2 wheeled)   Gait velocity: decreased   General Gait Details: requires cues for safety with use of RW. initially we attempted to walk further, but then patient stated he couldn't a sat back down on the bed.   Stairs             Wheelchair Mobility    Modified Rankin (Stroke Patients Only)       Balance Overall balance assessment: Needs assistance Sitting-balance support: Single extremity supported;Feet supported Sitting balance-Leahy Scale: Fair     Standing balance support: Bilateral upper extremity supported Standing balance-Leahy Scale: Fair Standing balance comment: able to maintain static standing with min guard assist                             Cognition Arousal/Alertness: Awake/alert Behavior During Therapy: WFL for tasks assessed/performed Overall Cognitive Status: History of cognitive impairments - at baseline Area of Impairment: Safety/judgement;Orientation                 Orientation Level: Situation;Disoriented to     Following Commands: Follows one step commands with increased time Safety/Judgement: Decreased awareness of safety;Decreased awareness of deficits   Problem Solving: Requires tactile cues;Requires verbal cues        Exercises      General Comments        Pertinent Vitals/Pain Pain Assessment: Faces Faces Pain Scale: Hurts a little bit Pain Location: all over, bottom Pain Descriptors / Indicators: Discomfort;Aching;Grimacing;Moaning Pain Intervention(s): Monitored during session;Repositioned    Home Living  Prior Function            PT Goals (current goals can now be found in the care plan section) Acute Rehab PT Goals Patient Stated Goal: to return home " i think I could do better at home" PT Goal Formulation: With patient Time For Goal Achievement: 10/26/18 Potential to Achieve Goals: Fair Progress towards PT goals: Not  progressing toward goals - comment(limited by lethargy and pain)    Frequency    Min 3X/week      PT Plan Current plan remains appropriate    Co-evaluation              AM-PAC PT "6 Clicks" Mobility   Outcome Measure  Help needed turning from your back to your side while in a flat bed without using bedrails?: A Little Help needed moving from lying on your back to sitting on the side of a flat bed without using bedrails?: A Little Help needed moving to and from a bed to a chair (including a wheelchair)?: A Little Help needed standing up from a chair using your arms (e.g., wheelchair or bedside chair)?: A Little Help needed to walk in hospital room?: A Lot Help needed climbing 3-5 steps with a railing? : A Lot 6 Click Score: 16    End of Session Equipment Utilized During Treatment: Gait belt Activity Tolerance: Patient limited by lethargy Patient left: in chair;with chair alarm set;with call bell/phone within reach Nurse Communication: Mobility status PT Visit Diagnosis: Muscle weakness (generalized) (M62.81);Unsteadiness on feet (R26.81);Difficulty in walking, not elsewhere classified (R26.2);Pain Pain - part of body: ("all over" & bottom)     Time: 7622-6333 PT Time Calculation (min) (ACUTE ONLY): 26 min  Charges:  $Gait Training: 23-37 mins                     [employer], PT, GCS 10/23/18,11:26 AM

## 2018-10-23 NOTE — Progress Notes (Signed)
PROGRESS NOTE    Ronald [name]  QAS:[id number] DOB: [birth year] DOA: 10/17/2018 PCP: Ronald [name], Ronald Halsted, MD   Brief Narrative:   Ronald [name] an 85 year old male with past medical history relevant for h/o prostate cancer, CAD s/p CABG in 1989 and redo bypass in 1997, HTN, HLD, mild dementia, prostate cancer and atrial fibrillation,history of kidney stones and urethral stricturesp suprapubic catheter, GERD admitted on 10/17/2018 with generalized weakness, fever, and chills and found to haveMSSA bacteremia as well as MSSA in his urine.TTE without evidence of endocarditis.Previously on hospice and determining goals of care  Assessment & Plan:   Active Problems:   HYPERCHOLESTEROLEMIA   HTN (hypertension)   PROSTATE CANCER, HX OF   Urinary tract infection   Urinary tract infection associated with cystostomy catheter (HCC)   Coronary artery disease involving coronary bypass graft of native heart without angina pectoris   History of prostate cancer   Suprapubic catheter ([place])   Debility   AKI (acute kidney injury) ([place])   Chronic combined systolic (congestive) and diastolic (congestive) heart failure (HCC)   PAF (paroxysmal atrial fibrillation) (HCC)   Acute on chronic renal failure (HCC)   Anemia due to chronic kidney disease   Sepsis ([place])   MSSA bacteremia   MSSAbacteremia-likely urinary source from suprapubic catheter. Repeat blood cultures without growth to date. Discussed with Mrs. Sharperecommendations for treatment of 6 weeks of IV antibiotics with cefazolin and need for PICC line depending upon goals of care. Continue current dose of cefazolin. -Patient met sepsis criteria on admission with fevers, leukocytosis and worsening renal function -Recheck labs in am -Family deciding on further treatment and PICC at home  Acute kidney injury on chronic kidney disease IV-creatinine stable at 5.48, which is close to patient's baseline, on admission creatinine was  7.92. Nephrology previously indicated patient would not want dialysis. Potassium levels within normal ranges.  --- renally adjust medications, avoid nephrotoxic agents / dehydration / hypotension  -Recheck AM labs  Recurrent catheter associated UTI--- urine culture with MSSA, suprapubic catheter last changed 10/19/2018--- treat with IV Ancef as above  Chronic A. Fib--- continue Eliquis 2.5 mg twice daily for anticoagulation, not requiring rate control agents  H/o CAD--- asymptomatic, chest pain-free, Lipitor and Imdur on hold  Anemia of CKD--- hemoglobin is stable at 9.7,  HFrEF--- combined systolic and diastolic dysfunction CHF with EF in the 40 to 45% range based on echo from 08/29/2018--monitor fluid status carefully, use diuretics as needed  Social/Ethics--- prior to admission patient was previously enrolled in hospice,,,   hospice and palliative care consult appreciated Patient is now DNR.   Disposition/Need for in-Hospital Stay- patient unable to be discharged at this time due to acquiring IV antibiotics depending on goals of care may need PICC line and weeks of IV antibiotics*   DVT prophylaxis:Eliquis Code Status: DNR Family Communication: Palliative discussing with family  Disposition Plan: Home hospice vs SNF with PICC per family decision   Consultants:   ID  Palliative  Procedures:   None  Antimicrobials:  Anti-infectives (From admission, onward)   Start     Dose/Rate Route Frequency Ordered Stop   10/20/18 1400  ceFAZolin (ANCEF) IVPB 1 g/50 mL premix     1 g 100 mL/hr over 30 Minutes Intravenous Every 24 hours 10/20/18 1325     10/18/18 1400  DAPTOmycin (CUBICIN) 500 mg in sodium chloride 0.9 % IVPB  Status:  Discontinued     500 mg 220 mL/hr over 30 Minutes Intravenous Every 48  hours 10/18/18 1320 10/20/18 1318   10/17/18 2200  meropenem (MERREM) 1 g in sodium chloride 0.9 % 100 mL IVPB  Status:  Discontinued     1 g 200 mL/hr over 30 Minutes  Intravenous Every 24 hours 10/17/18 2040 10/18/18 1320   10/17/18 1800  aztreonam (AZACTAM) 1 g in sodium chloride 0.9 % 100 mL IVPB     1 g 200 mL/hr over 30 Minutes Intravenous  Once 10/17/18 1756 10/17/18 2111       Subjective: Patient seen and evaluated today with no new acute complaints or concerns. No acute concerns or events noted overnight.  Objective: Vitals:   10/22/18 0621 10/22/18 1531 10/22/18 1947 10/23/18 0500  BP: 122/63 112/85 109/66   Pulse: 71 76 73   Resp:      Temp: 97.8 F (36.6 C) 97.9 F (36.6 C) 97.9 F (36.6 C)   TempSrc: Oral Oral Oral   SpO2: 99% 99% 96%   Weight: 63.1 kg   63.5 kg  Height:    _0  (1.6 m)    Intake/Output Summary (Last 24 hours) at 10/23/2018 1226 Last data filed at 10/23/2018 1100 Gross per 24 hour  Intake 1070 ml  Output 900 ml  Net 170 ml   Filed Weights   10/21/18 0500 10/22/18 0621 10/23/18 0500  Weight: 64 kg 63.1 kg 63.5 kg    Examination:  General exam: Appears calm and comfortable  Respiratory system: Clear to auscultation. Respiratory effort normal. Cardiovascular system: S1 & S2 heard, RRR. No JVD, murmurs, rubs, gallops or clicks. No pedal edema. Gastrointestinal system: Abdomen is nondistended, soft and nontender. No organomegaly or masses felt. Normal bowel sounds heard. Central nervous system: Alert and oriented. No focal neurological deficits. Extremities: Symmetric 5 x 5 power. Skin: No rashes, lesions or ulcers Psychiatry: Judgement and insight appear normal. Mood & affect appropriate.     Data Reviewed: I have personally reviewed following labs and imaging studies  CBC: Recent Labs  Lab 10/17/18 1707 10/17/18 1732 10/18/18 0320 10/19/18 0444 10/20/18 0352 10/21/18 0722  WBC 27.1*  --  21.7* 18.5* 16.2* 13.6*  NEUTROABS 25.7*  --   --   --   --   --   HGB 9.0* 8.5* 8.7* 9.0* 8.9* 9.7*  HCT 27.0* 25.0* 25.9* 27.1* 26.3* 28.8*  MCV 95.4  --  93.8 94.8 91.6 92.9  PLT 131*  --  125* 122*  139* 403   Basic Metabolic Panel: Recent Labs  Lab 10/18/18 0320 10/19/18 0444 10/20/18 0352 10/21/18 0722 10/22/18 0418 10/23/18 0509  NA 135 138 136 137 137 134*  K 4.7 4.1 3.3* 3.9 4.2 4.4  CL 99 107 107 108 107 103  CO2 16* 16* 14* 19* 17* 17*  GLUCOSE 146* 130* 126* 142* 145* 125*  BUN 120* 117* 112* 106* 104* 111*  CREATININE 7.92* 6.99* 6.13* 5.47* 5.48* 5.43*  CALCIUM 8.6* 8.4* 8.3* 8.2* 8.2* 8.3*  MG 2.1  --   --   --   --   --   PHOS 7.6*  --   --  4.7* 5.0* 4.5   GFR: Estimated Creatinine Clearance: 7.4 mL/min (A) (by C-G formula based on SCr of 5.43 mg/dL (H)). Liver Function Tests: Recent Labs  Lab 10/18/18 0320 10/21/18 0722 10/22/18 0418 10/23/18 0509  AST 41  --   --   --   ALT 23  --   --   --   ALKPHOS 142*  --   --   --  BILITOT 0.6  --   --   --   PROT 5.5*  --   --   --   ALBUMIN 2.8* 2.3* 2.2* 2.1*   Recent Labs  Lab 10/17/18 1855  LIPASE 23   No results for input(s): AMMONIA in the last 168 hours. Coagulation Profile: No results for input(s): INR, PROTIME in the last 168 hours. Cardiac Enzymes: Recent Labs  Lab 10/18/18 0320  CKTOTAL 79   BNP (last 3 results) Recent Labs    09/13/18 1503  PROBNP 10,359*   HbA1C: No results for input(s): HGBA1C in the last 72 hours. CBG: Recent Labs  Lab 10/17/18 1704  GLUCAP 109*   Lipid Profile: No results for input(s): CHOL, HDL, LDLCALC, TRIG, CHOLHDL, LDLDIRECT in the last 72 hours. Thyroid Function Tests: No results for input(s): TSH, T4TOTAL, FREET4, T3FREE, THYROIDAB in the last 72 hours. Anemia Panel: No results for input(s): VITAMINB12, FOLATE, FERRITIN, TIBC, IRON, RETICCTPCT in the last 72 hours. Sepsis Labs: Recent Labs  Lab 10/17/18 1729 10/18/18 0320  PROCALCITON  --  47.49  LATICACIDVEN 1.8  --     Recent Results (from the past 240 hour(s))  Blood culture (routine x 2)     Status: Abnormal   Collection Time: 10/17/18  5:07 PM   Specimen: BLOOD RIGHT FOREARM   Result Value Ref Range Status   Specimen Description BLOOD RIGHT FOREARM  Final   Special Requests   Final    BOTTLES DRAWN AEROBIC AND ANAEROBIC Blood Culture adequate volume   Culture  Setup Time   Final    GRAM POSITIVE COCCI IN CLUSTERS IN BOTH AEROBIC AND ANAEROBIC BOTTLES CRITICAL RESULT CALLED TO, READ BACK BY AND VERIFIED WITH: J. FRENS,PHARMD 1258 10/18/2018 TRosalia Hammers Performed at [facility], [place] [address], [place], [place] [address]    Culture STAPHYLOCOCCUS AUREUS (A)  Final   Report Status 10/20/2018 FINAL  Final   Organism ID, Bacteria STAPHYLOCOCCUS AUREUS  Final      Susceptibility   Staphylococcus aureus - MIC*    CIPROFLOXACIN >=8 RESISTANT Resistant     ERYTHROMYCIN >=8 RESISTANT Resistant     GENTAMICIN <=0.5 SENSITIVE Sensitive     OXACILLIN 0.5 SENSITIVE Sensitive     TETRACYCLINE <=1 SENSITIVE Sensitive     VANCOMYCIN <=0.5 SENSITIVE Sensitive     TRIMETH/SULFA <=10 SENSITIVE Sensitive     CLINDAMYCIN <=0.25 SENSITIVE Sensitive     RIFAMPIN <=0.5 SENSITIVE Sensitive     Inducible Clindamycin NEGATIVE Sensitive     * STAPHYLOCOCCUS AUREUS  Blood Culture ID Panel (Reflexed)     Status: Abnormal   Collection Time: 10/17/18  5:07 PM  Result Value Ref Range Status   Enterococcus species NOT DETECTED NOT DETECTED Final   Listeria monocytogenes NOT DETECTED NOT DETECTED Final   Staphylococcus species DETECTED (A) NOT DETECTED Final    Comment: CRITICAL RESULT CALLED TO, READ BACK BY AND VERIFIED WITH: J. FRENS,PHARMD 1258 10/18/2018 T. TYSOR    Staphylococcus aureus (BCID) DETECTED (A) NOT DETECTED Final    Comment: Methicillin (oxacillin)-resistant Staphylococcus aureus (MRSA). MRSA is predictably resistant to beta-lactam antibiotics (except ceftaroline). Preferred therapy is vancomycin unless clinically contraindicated. Patient requires contact precautions if  hospitalized. CRITICAL RESULT CALLED TO, READ BACK BY AND VERIFIED WITH: J. FRENS,PHARMD 1258  10/18/2018 T. TYSOR    Methicillin resistance DETECTED (A) NOT DETECTED Final    Comment: CRITICAL RESULT CALLED TO, READ BACK BY AND VERIFIED WITH: J. FRENS,PHARMD 1258 10/18/2018 T. Rosalia Hammers  Streptococcus species NOT DETECTED NOT DETECTED Final   Streptococcus agalactiae NOT DETECTED NOT DETECTED Final   Streptococcus pneumoniae NOT DETECTED NOT DETECTED Final   Streptococcus pyogenes NOT DETECTED NOT DETECTED Final   Acinetobacter baumannii NOT DETECTED NOT DETECTED Final   Enterobacteriaceae species NOT DETECTED NOT DETECTED Final   Enterobacter cloacae complex NOT DETECTED NOT DETECTED Final   Escherichia coli NOT DETECTED NOT DETECTED Final   Klebsiella oxytoca NOT DETECTED NOT DETECTED Final   Klebsiella pneumoniae NOT DETECTED NOT DETECTED Final   Proteus species NOT DETECTED NOT DETECTED Final   Serratia marcescens NOT DETECTED NOT DETECTED Final   Haemophilus influenzae NOT DETECTED NOT DETECTED Final   Neisseria meningitidis NOT DETECTED NOT DETECTED Final   Pseudomonas aeruginosa NOT DETECTED NOT DETECTED Final   Candida albicans NOT DETECTED NOT DETECTED Final   Candida glabrata NOT DETECTED NOT DETECTED Final   Candida krusei NOT DETECTED NOT DETECTED Final   Candida parapsilosis NOT DETECTED NOT DETECTED Final   Candida tropicalis NOT DETECTED NOT DETECTED Final    Comment: Performed at [facility], [place] [address] [place], [place] [address]  SARS Coronavirus 2 (CEPHEID- Performed in [place] hospital lab), Hosp Order     Status: None   Collection Time: 10/17/18  5:19 PM   Specimen: Nasopharyngeal  Result Value Ref Range Status   SARS Coronavirus 2 NEGATIVE NEGATIVE Final    Comment: (NOTE) If result is NEGATIVE SARS-CoV-2 target nucleic acids are NOT DETECTED. The SARS-CoV-2 RNA is generally detectable in upper and lower  respiratory specimens during the acute phase of infection. The lowest  concentration of SARS-CoV-2 viral copies this assay can detect  is 250  copies / mL. A negative result does not preclude SARS-CoV-2 infection  and should not be used as the sole basis for treatment or other  patient management decisions.  A negative result may occur with  improper specimen collection / handling, submission of specimen other  than nasopharyngeal swab, presence of viral mutation(s) within the  areas targeted by this assay, and inadequate number of viral copies  (<250 copies / mL). A negative result must be combined with clinical  observations, patient history, and epidemiological information. If result is POSITIVE SARS-CoV-2 target nucleic acids are DETECTED. The SARS-CoV-2 RNA is generally detectable in upper and lower  respiratory specimens dur ing the acute phase of infection.  Positive  results are indicative of active infection with SARS-CoV-2.  Clinical  correlation with patient history and other diagnostic information is  necessary to determine patient infection status.  Positive results do  not rule out bacterial infection or co-infection with other viruses. If result is PRESUMPTIVE POSTIVE SARS-CoV-2 nucleic acids MAY BE PRESENT.   A presumptive positive result was obtained on the submitted specimen  and confirmed on repeat testing.  While 2019 novel coronavirus  (SARS-CoV-2) nucleic acids may be present in the submitted sample  additional confirmatory testing may be necessary for epidemiological  and / or clinical management purposes  to differentiate between  SARS-CoV-2 and other Sarbecovirus currently known to infect humans.  If clinically indicated additional testing with an alternate test  methodology [phone]) is advised. The SARS-CoV-2 RNA is generally  detectable in upper and lower respiratory sp ecimens during the acute  phase of infection. The expected result is Negative. Fact Sheet for Patients:  StrictlyIdeas.no Fact Sheet for Healthcare Providers:  BankingDealers.co.za This test is not yet approved or cleared by the [place] FDA and has been  authorized for detection and/or diagnosis of SARS-CoV-2 by FDA under an Emergency Use Authorization (EUA).  This EUA will remain in effect (meaning this test can be used) for the duration of the COVID-19 declaration under Section 564(b)(1) of the Act, 21 U.S.C. section 360bbb-3(b)(1), unless the authorization is terminated or revoked sooner. Performed at [facility], [place] [address] [place], [place] [address]   Urine culture     Status: Abnormal   Collection Time: 10/17/18  5:21 PM   Specimen: Urine, Random  Result Value Ref Range Status   Specimen Description URINE, RANDOM  Final   Special Requests   Final    NONE Performed at [facility], [place] [address] [place], [place] [address]    Culture >=100,000 COLONIES/mL STAPHYLOCOCCUS AUREUS (A)  Final   Report Status 10/19/2018 FINAL  Final   Organism ID, Bacteria STAPHYLOCOCCUS AUREUS (A)  Final      Susceptibility   Staphylococcus aureus - MIC*    CIPROFLOXACIN 4 RESISTANT Resistant     GENTAMICIN <=0.5 SENSITIVE Sensitive     NITROFURANTOIN <=16 SENSITIVE Sensitive     OXACILLIN 0.5 SENSITIVE Sensitive     TETRACYCLINE <=1 SENSITIVE Sensitive     VANCOMYCIN <=0.5 SENSITIVE Sensitive     TRIMETH/SULFA <=10 SENSITIVE Sensitive     CLINDAMYCIN <=0.25 SENSITIVE Sensitive     RIFAMPIN <=0.5 SENSITIVE Sensitive     Inducible Clindamycin NEGATIVE Sensitive     * >=100,000 COLONIES/mL STAPHYLOCOCCUS AUREUS  Blood culture (routine x 2)     Status: Abnormal   Collection Time: 10/17/18  5:23 PM   Specimen: BLOOD  Result Value Ref Range Status   Specimen Description BLOOD LEFT ANTECUBITAL  Final   Special Requests   Final    BOTTLES DRAWN AEROBIC AND ANAEROBIC Blood Culture results may not be optimal due to an inadequate volume of blood received in culture bottles   Culture  Setup Time   Final    GRAM  POSITIVE COCCI IN CLUSTERS IN BOTH AEROBIC AND ANAEROBIC BOTTLES CRITICAL RESULT CALLED TO, READ BACK BY AND VERIFIED WITH: J. FRENS,PHARMD 1258 10/18/2018 T. TYSOR    Culture (A)  Final    STAPHYLOCOCCUS AUREUS SUSCEPTIBILITIES PERFORMED ON PREVIOUS CULTURE WITHIN THE LAST 5 DAYS. Performed at [facility], [place] [address] [place], [place] [address]    Report Status 10/20/2018 FINAL  Final  Culture, blood (routine x 2)     Status: None (Preliminary result)   Collection Time: 10/19/18  4:44 AM   Specimen: BLOOD  Result Value Ref Range Status   Specimen Description BLOOD LEFT ARM  Final   Special Requests   Final    BOTTLES DRAWN AEROBIC ONLY Blood Culture adequate volume   Culture   Final    NO GROWTH 4 DAYS Performed at [facility], [place] [address] [place], [place] [address]    Report Status PENDING  Incomplete  Culture, blood (routine x 2)     Status: None (Preliminary result)   Collection Time: 10/19/18  4:44 AM   Specimen: BLOOD  Result Value Ref Range Status   Specimen Description BLOOD LEFT ARM  Final   Special Requests   Final    BOTTLES DRAWN AEROBIC ONLY Blood Culture adequate volume   Culture   Final    NO GROWTH 4 DAYS Performed at [facility], [place] [address], [place],  [address]    Report Status PENDING  Incomplete  Radiology Studies: No results found.      Scheduled Meds: . acetaminophen  1,000 mg Oral TID  . apixaban  2.5 mg Oral BID  . omeprazole  40 mg Oral Daily  . oxyCODONE  2.5 mg Oral Q6H  . polyethylene glycol  17 g Oral BID  . senna-docusate  2 tablet Oral BID  . sodium bicarbonate  650 mg Oral TID  . sodium chloride flush  3 mL Intravenous Once   Continuous Infusions: .  ceFAZolin (ANCEF) IV 1 g (10/22/18 1733)     LOS: 5 days    Time spent: 30 minutes     Darleen Crocker, DO [facility] Pager [phone]  If 7PM-7AM, please contact night-coverage [email or web address] Password [facility]  10/23/2018, 12:26 PM

## 2018-10-23 NOTE — Progress Notes (Signed)
Urology  I spoke with the patient face to face and his family (wife, daughter and son) via teleconference at length concerning his recurrent UTIs, bilateral hydronephrosis and ESRD.  The patient's family was undecided about which route of care (comfort care vs bilateral nephrostomy tube placement) they would like to proceed with.    I explained in detail the pros and cons of bilateral nephrostomy tube placement.  The pros being potential (albeit marginal) improvement in his renal function, possible improvement in his pain and potential prolongation of his life.  The cons being bleeding complications, the need to come off of Eliquis prior to the procedure and the associated risk of DVT/PE, pain from the nephrostomy tubes, the need for tube exchanges every 3-4 months, daily tube flushes, recurrent upper tract infections from the nephrostomy tubes, continued lower urinary tract infections due to his suprapubic tube along the with risks of the procedure itself.    At the conclusion of the conversation I had with the patient's family, there were still undecided about which option they were going to choose.  The patient stated that he did not want to have any invasive procedures performed.   Should the patient and the family decide to proceed with nephrostomy tube placement, cardiology would need to clear him to come off of Eliquis prior to the procedure and IR would need to be consulted for bilateral nephrostomy tube placement.  If there are any questions or concerns, please call me at [phone].  Ellison Hughs, MD [facility]

## 2018-10-23 NOTE — Telephone Encounter (Signed)
Left message on machine for Santiago Glad.  Appointment blocked for 1:30 10/24/2018

## 2018-10-23 NOTE — Consult Note (Signed)
   [facility] CM Inpatient Consult   10/23/2018  Ronald [name] May 09, 1927 [id number]    Patientreviewed forreadmission with 41% extreme high riskscore for unplanned readmissions; has30-day readmission, 4 hospitalizations and 2 ED visitsin the past 6 months; and to check for potential [facility] care management needs for services as benefit from his Medicare/ Next[employer] plan.Patient waspreviously outreached [place] EMMI follow-up calls and was engaged with [facility] community care coordinator in distant past.  Per chart review and MD note on 10/22/18, show as follows: [name] an 85 year old male withpast medical history relevant forh/oprostate cancer,CAD s/p CABG in 1989 and redo bypass in 1997, HTN, HLD, mild dementia, prostate cancer and atrial fibrillation,history of kidney stones and urethral strictures/p suprapubic catheter, GERD.   Readmitted on 7/22/2020with generalized weakness, fever, and chills and found to haveMSSA bacteremia as well as MSSA in his urine.Previously on hospice and determining goals of care.     Primary Care provider is Dr. Lelon Frohlich with [facility] [place], listed to provide transition of care.  [name] is under hospice services with [facility] with a hospice diagnosis of ESRD with heart failure, per Dr. Konrad Dolores, prior to admission.   Medical record review shows MD note today that disposition plan is Home Hospice vs SNF(skilled nursing facility) with PICC per family decision. Palliative continue to discuss with family.  If there will be any changes in disposition plan, please place a referral to [facility] CM for follow-up as appropriate.  Of note, [facility] Care Management services does not replace or interfere with any services that are arranged by inpatient TOC case management or social work.    For additional questions or referrals please contact:  Edwena Felty A. Carita Sollars, BSN, RN-BC [facility]  Liaison Cell: [phone]

## 2018-10-23 NOTE — Progress Notes (Signed)
[place] 6E30 [facility] ([facility] GIP RN Note @ 1600  Mr. Biello is under hospice services with [facility] with a hospice diagnosis of ESRD with heart failure, per Dr. Konrad Dolores.  Family activated EMS after pt was experiencing lower abdominal pain, wife hospice know after EMS was notified.  Mr. Wulff is admitted with urosepsis.  This is a related hospital admission.   PMT had a very lengthy conference call with wife and two children today surrounding GOC, and trajectory of disease process.  Per PMT, wife and daughter are leaning towards comfort care and not home with PICC and abx.  Discussed with [facility] IDT, family would be unable to care for his IV treatment at home and would likely need SNF placement should they decide to go that route.  Family was going to continue to discuss tonight, discuss with pts PCP tomorrow before making a decision.  Pt pain currently managed with scheduled oxy IR and tylenol.   V/S:  98 oral, 112/78, HR 71, RR 19, spo2 99% RA I&O:  1020/900 Wt:  down 3kg since admission, only consuming 1/4 of most meals Abnormal lab work:  Na 134, CO2 17, BUN 111, Cr 5.43, albumin 2.1, gfr 9 IVs/PRNs:  Ancef 1g QD  Problem List: Urosepsis:  MSSA bacteremia; suprapubic catheter exchanged; no growth on blood cultures; ancef 1g IV QD AKI on CKD: Cr 6.13>5.42> 5.48>5.43  IDT:  Updated  Family:  This Probation officer and his home care LCSW have tried to contact spouse several times, messages left for return call.  GOC:  Family agreed to DNR status today, they are still not sure about full comfort and have asked pts PCP to weigh in tomorrow.  They will have a conference call with Dr. Deniece Ree (PCP) tomorrow.  D/C planning:  Unclear.  Family will not be able to provide the level of care should they decide IV abx.    Thank you, Venia Carbon RN ,BSN, [facility] Liaison (in [place]) [phone]

## 2018-10-23 NOTE — Progress Notes (Signed)
Pharmacy Antibiotic Note  Ronald [name] is a 85 y.o. male admitted on 10/17/2018 with MSSA bacteremia. ID consulted, TTE negative for IE. ID awaiting hospice/palliative GOC prior to deciding on final antibiotic recommendations. Pharmacy consulted for Cefazolin dosing.   The patient is noted to have AKI this admit - with SCr down to 5.43 (peak 8.26), CrCl hard to estimate, UOP 0.6 ml/kg/hr. Will continue to monitor changes for need to adjust the antibiotic dose.   Plan: - Continue Cefazolin 1g IV every 24 hours - Will continue to follow renal function, culture results, LOT, and antibiotic de-escalation plans   Height: 5\' 3"  (160 cm) Weight: 140 lb (63.5 kg) IBW/kg (Calculated) : 56.9  Temp (24hrs), Avg:97.9 F (36.6 C), Min:97.9 F (36.6 C), Max:97.9 F (36.6 C)  Recent Labs  Lab 10/17/18 1707 10/17/18 1729 10/18/18 0320 10/19/18 0444 10/20/18 0352 10/21/18 0722 10/22/18 0418 10/23/18 0509  WBC 27.1*  --  21.7* 18.5* 16.2* 13.6*  --   --   CREATININE  --   --  7.92* 6.99* 6.13* 5.47* 5.48* 5.43*  LATICACIDVEN  --  1.8  --   --   --   --   --   --     Estimated Creatinine Clearance: 7.4 mL/min (A) (by C-G formula based on SCr of 5.43 mg/dL (H)).    Allergies  Allergen Reactions  . Lidocaine Shortness Of Breath and Swelling    Mouth swelling   . Pantoprazole Other (See Comments)    Made the patient lightheaded and dizzy  . Nitrofurantoin Hives and Other (See Comments)    Also made the patient lightheaded and dizzy  . Sulfa Antibiotics Hives and Itching  . Tape Other (See Comments)    TAPE WILL TEAR THE SKIN!!!    Antimicrobials this admission: Aztreonam 7/22 x1  Meropenem 7/22 >> 7/23 Daptomycin 7/23 >> 7/25 Cefazolin 7/25 >>  Microbiology results: 7/22 BCx: MSSA 7/22 UCx: MSSA  7/22 BCID: MRSA 7/22 COVID: neg 7/24 BCx: ng2d  Thank you for allowing pharmacy to be a part of this patient's care.  Alycia Rossetti, PharmD, BCPS Clinical Pharmacist Clinical  phone for 10/23/2018: [phone] 10/23/2018 11:03 AM   **Pharmacist phone directory can now be found on [email or web address] ([email or web address]).  Listed under [place].

## 2018-10-24 ENCOUNTER — Encounter: Payer: Self-pay | Admitting: Internal Medicine

## 2018-10-24 ENCOUNTER — Ambulatory Visit (INDEPENDENT_AMBULATORY_CARE_PROVIDER_SITE_OTHER): Payer: Medicare Other | Admitting: Internal Medicine

## 2018-10-24 DIAGNOSIS — Z7189 Other specified counseling: Secondary | ICD-10-CM

## 2018-10-24 DIAGNOSIS — Z66 Do not resuscitate: Secondary | ICD-10-CM

## 2018-10-24 LAB — CBC
HCT: 25.1 % — ABNORMAL LOW (ref 39.0–52.0)
Hemoglobin: 8.2 g/dL — ABNORMAL LOW (ref 13.0–17.0)
MCH: 31.1 pg (ref 26.0–34.0)
MCHC: 32.7 g/dL (ref 30.0–36.0)
MCV: 95.1 fL (ref 80.0–100.0)
Platelets: 156 10*3/uL (ref 150–400)
RBC: 2.64 MIL/uL — ABNORMAL LOW (ref 4.22–5.81)
RDW: 15.9 % — ABNORMAL HIGH (ref 11.5–15.5)
WBC: 11.2 10*3/uL — ABNORMAL HIGH (ref 4.0–10.5)
nRBC: 0 % (ref 0.0–0.2)

## 2018-10-24 LAB — CULTURE, BLOOD (ROUTINE X 2)
Culture: NO GROWTH
Culture: NO GROWTH
Special Requests: ADEQUATE
Special Requests: ADEQUATE

## 2018-10-24 LAB — RENAL FUNCTION PANEL
Albumin: 1.9 g/dL — ABNORMAL LOW (ref 3.5–5.0)
Anion gap: 12 (ref 5–15)
BUN: 112 mg/dL — ABNORMAL HIGH (ref 8–23)
CO2: 18 mmol/L — ABNORMAL LOW (ref 22–32)
Calcium: 8 mg/dL — ABNORMAL LOW (ref 8.9–10.3)
Chloride: 104 mmol/L (ref 98–111)
Creatinine, Ser: 5.81 mg/dL — ABNORMAL HIGH (ref 0.61–1.24)
GFR calc Af Amer: 9 mL/min — ABNORMAL LOW (ref >60)
GFR calc non Af Amer: 8 mL/min — ABNORMAL LOW (ref >60)
Glucose, Bld: 148 mg/dL — ABNORMAL HIGH (ref 70–99)
Phosphorus: 4.6 mg/dL (ref 2.5–4.6)
Potassium: 4.3 mmol/L (ref 3.5–5.1)
Sodium: 134 mmol/L — ABNORMAL LOW (ref 135–145)

## 2018-10-24 MED ORDER — SENNA 8.6 MG PO TABS
2.0000 | ORAL_TABLET | Freq: Two times a day (BID) | ORAL | Status: DC
  Administered 2018-10-25: 10:00:00 17.2 mg via ORAL
  Filled 2018-10-24: qty 2

## 2018-10-24 NOTE — Progress Notes (Signed)
    This is a visit that has been scheduled at patient's family's request to discuss his current state and to obtain my opinion about recommendations that are being given by hospital providers.  Present during this video conference today were patient's wife [name], daughter Santiago Glad and son Shanon Brow.  Mr. Gell is again hospitalized with urosepsis and MSSA bacteremia.  Recommendation is for 6 weeks of IV antibiotic therapy if aggressive care is wanted.  Aggressive care should also include suprapubic catheters as discussed with urology.  Patient has stated he does not want a suprapubic catheter.  Family understands risks of patient undergoing this procedure including but not limited to increased pain and increased risk of infection.  We have spent a significant amount of time on this discussion today.  I have reminded them of Mr. Muchow's words when he was last in the office: " I do not want to live like this anymore".  He also has advanced renal failure and has already elected not to pursue hemodialysis.  I have advised family to consider that treating the MSSA bacteremia would only be treating exactly that, and that his baseline renal failure, nausea, dizziness that he has been battling for months, will not be cured by this.  I furthermore do not think that treating his current bacteremia will necessarily change his overall picture and prognosis.  After discussion with family and review of hospital charts, my recommendation would be to pursue comfort care with hospice and to withhold non-comfort care strategies.  Time spent on this discussion: 38 minutes  Domingo Mend, MD

## 2018-10-24 NOTE — Progress Notes (Signed)
Palliative:  HPI: 85 y.o.malewith past medical history of CHF (EF 30-35%), thoracic compression fractures, h/o prostate cancer, suprapubic catheter, recurrent UTI, CKD stage IVadmitted on 7/22/2020with fever, SOB, fatigue, and decreased urine output.Found to have MSSA UTI and bacteremia. Palliative care to assist as family struggling with decision for continued aggressive care vs comfort care. Many discussions and weigh in from multiple providers being pursued so family can make informed decisions.    I met again today with Mr. Tubbs who is resting comfortably in bed. He arouses when I step closer to his bedside. He tells me that he was able to rest better last night. His pain is still present but is improved. He is still struggling to get comfortable. RN reports that he was able to sit in chair for a bit yesterday. He was able to eat some of his breakfast.   I called and discussed with son, Shanon Brow, after scheduled conference with Mr. Guillette PCP. Shanon Brow confirms that after taking all discussions (our discussion, urology, and PCP) that they do feel that focusing on comfort and foregoing IV antibiotics are in his father's best interest. He shares that he has noted his father's suffering and finds peace knowing his father has strong faith and will be healed in heaven. Family are all onboard to focus on comfort care at this point.   I also called and spoke with wife, [name]. [name] confirms decision for comfort care and reiterates to me that she just wanted to be sure that her children had peace with these decisions as well. I confirmed with her that I have had conversations (privately) with herself, son, and daughter and they all appear to truly desire for Mr. Patteson to be comfortable. She agrees that they are all now on the same page for comfort care. We further discussed options for hospice at home (but anticipating the burden of care and the fact she has been already struggling to provide his care  at home) vs care at hospice facility. We discussed the difference and she wishes to discuss with her children further. I told her that I would ask the hospice liaison to reach out to them to discuss these options further and she would like this.   All questions/concerns addressed. Emotional support provided.   Discussed plan with Dr. Manuella Ghazi and Venia Carbon, RN with [facility] hospice.   Exam: Alert, confused. Fatigue. No distress. Breathing regular, unlabored. Abd distended but soft. Generalized weakness.   Plan: - Decision NOT to pursue IV antibiotics and to focus on comfort.  - Family deciding home with hospice vs hospice facility.  - Pain: Continue OxyIR 2.5 mg po every 6 hours scheduled. OxyIR 2.5 mg po every 3 hours prn. Tylenol 1000 mg po TID.  - Bowel Regimen: Senokot 2 tablets BID. Miralax BID (not taking consistently). D/C colace portion of senokot-s d/t RN reports liquid stool 1/42.   35 min  Vinie Sill, NP Palliative Medicine Team Pager [phone] (Please see [email or web address] for schedule) Team Phone [phone]    Greater than 50%  of this time was spent counseling and coordinating care related to the above assessment and plan

## 2018-10-24 NOTE — Progress Notes (Signed)
[place] 6E30 [facility] ([facility] GIP RN Note @ 1500  Ronald [name] is under hospice services with [facility] with a hospice diagnosis of ESRD with heart failure, per Dr. Konrad Dolores. Family activated EMS after pt was experiencing lower abdominal pain, wife hospiceknow after EMS was notified. Ronald [name] is admitted with urosepsis. This is a related hospital admission.   Ronald [name] and their two kids had a lengthy conversation with his PCP to get her input.  Spoke with wife and dtr, they are in agreement with focusing on comfort and dignity for Ronald [name].  Spoke with wife and dtr individually, d/c plans are still unclear.  Ronald [name] would like to take him home, but is not sure she can provide the care she needs.  Her children are not wanting their mother to be "stressed" in caring for him.  We discussed home hospice and what that would look like.  That we could start nurse aid services, increase nursing visits, order DME.  We also discussed residential hospice, our visitation policy (two visitors at a time, total of four) and the criteria for residential hospice.  Ronald [name] said she needed time to think tonight and get a final weigh in from her children.  She ask that we call back in the am to finalize d/c plan.    No complaints of any unmet needs in the hospital.  V/S:  97.6 oral, 113/60, HR 70, RR 15, 99% room air I&O:  600/900 Abnormal lab work:  Glucose 148, BUN 112, Cr 5.81, albumin 1.9, gfr 8, WBC 11.8, RBC 2.64, hgb 8.2, HCT 25.1 Wt:  63 kg IVs/PRNs: ancef 1g IV QD, oxy IR 2.5 mg x 1  Problem list: Urosepsis: MSSA bacteremia; suprapubic catheter exchanged; no growth on blood cultures; ancef 1g IV QD AKI on CKD: Cr 6.13>5.42> 5.48>5.43>5.81  IDT:  Updated  Family:  Updated wife, dtr  D/C plan:  Ongoing, family has not made a decision on returning home with hospice vs residential hospice.  Family requests time overnight to discuss together and have a decision in the AM.  GOC:  Clear.   Full DNR, focus on comfort and dignity  Once pt is ready to d/c, please contact [facility] non-emergency transport, they contract this service for our active hospice pts  Venia Carbon RN, BSN, [place] (in [place]) [phone]

## 2018-10-24 NOTE — Telephone Encounter (Signed)
Patient's daughter called to confirm the appt. For 1:30 today.

## 2018-10-24 NOTE — Progress Notes (Signed)
PROGRESS NOTE    Ronald [name]  SHF:[id number] DOB: Aug 18, 1927 DOA: 10/17/2018 PCP: Isaac Bliss, [name], MD   Brief Narrative:  Mr. Bargo an 85 year old male withpast medical history relevant forh/oprostate cancer,CAD s/p CABG in 1989 and redo bypass in 1997, HTN, HLD, mild dementia, prostate cancer and atrial fibrillation,history of kidney stones and urethral stricturesp suprapubic catheter, GERDadmitted on 7/22/2020with generalized weakness, fever, and chills and found to haveMSSA bacteremia as well as MSSA in his urine.TTE without evidence of endocarditis.Previously on hospice and determining goals of care  7/29: Discussion had between family and PCP Dr. Jerilee Hoh earlier this afternoon.  It seems that patient and family would not desire any further aggressive care to include continuation of IV antibiotics and PICC line placement, nor would they like nephrostomy tubes to be placed.  Plan for discharge to home with hospice care on 7/30.  Assessment & Plan:   Active Problems:   HYPERCHOLESTEROLEMIA   HTN (hypertension)   PROSTATE CANCER, HX OF   Urinary tract infection   Urinary tract infection associated with cystostomy catheter (HCC)   Coronary artery disease involving coronary bypass graft of native heart without angina pectoris   History of prostate cancer   Suprapubic catheter ([place])   Debility   AKI (acute kidney injury) ([place])   Chronic combined systolic (congestive) and diastolic (congestive) heart failure (HCC)   PAF (paroxysmal atrial fibrillation) (HCC)   Acute on chronic renal failure (HCC)   Anemia due to chronic kidney disease   Sepsis ([place])   MSSA bacteremia   DNR (do not resuscitate)   MSSAbacteremia-likely urinary source from suprapubic catheter. Repeat blood cultures without growth to date. Discussed with Mrs. Sharperecommendations for treatment of 6 weeks of IV antibiotics with cefazolin and need for PICC line depending upon goals of  care. Continue current dose of cefazolin. -Patient met sepsis criteria on admission with fevers, leukocytosis and worsening renal function -Family would not desire any further IV antibiotics or PICC line placement. -We will plan to discharge with home hospice in a.m.  Acute kidney injury on chronic kidney diseaseIV-creatinine stable at 5.48,which is close to patient's baseline, on admission creatinine was 7.92.Nephrologypreviouslyindicated patient would not want dialysis. Potassium levels within normal ranges. ---renally adjust medications, avoid nephrotoxic agents / dehydration / hypotension   Recurrent catheter associated UTI---urine culture with MSSA,suprapubic catheter last changed 10/19/2018---treat with IV Ancef as above -Plan to discontinue on discharge  Chronic A. Fib---continue Eliquis 2.5 mg twice daily for anticoagulation, not requiring rate control agents  H/oCAD---asymptomatic, chest pain-free, Lipitor and Imdur on hold  Anemia of CKD---hemoglobin is stable at 9.7,  HFrEF---combined systolic and diastolic dysfunction CHF with EF in the 40 to 45% range based on echo from 08/29/2018--monitor fluid status carefully, usediuretics as needed  Social/Ethics---prior to admission patient was previously enrolled in hospice,,,hospice and palliative care consult appreciated Patient is now DNR and family members agreeable for discharge to home with hospice care on 7/30   Disposition/Need for in-Hospital Stay- patient unable to be discharged at this time due toacquiring IV antibiotics depending on goals of care may need PICC line and weeks of IV antibiotics*   DVT prophylaxis:Eliquis Code Status: DNR Family Communication:  PCP Dr. Jerilee Hoh spoken with family Disposition Plan:  Discharge to home with hospice in a.m.   Consultants:   ID  Palliative  Procedures:   None  Antimicrobials:  Anti-infectives (From admission, onward)   Start      Dose/Rate Route Frequency Ordered Stop   10/20/18  1400  ceFAZolin (ANCEF) IVPB 1 g/50 mL premix     1 g 100 mL/hr over 30 Minutes Intravenous Every 24 hours 10/20/18 1325     10/18/18 1400  DAPTOmycin (CUBICIN) 500 mg in sodium chloride 0.9 % IVPB  Status:  Discontinued     500 mg 220 mL/hr over 30 Minutes Intravenous Every 48 hours 10/18/18 1320 10/20/18 1318   10/17/18 2200  meropenem (MERREM) 1 g in sodium chloride 0.9 % 100 mL IVPB  Status:  Discontinued     1 g 200 mL/hr over 30 Minutes Intravenous Every 24 hours 10/17/18 2040 10/18/18 1320   10/17/18 1800  aztreonam (AZACTAM) 1 g in sodium chloride 0.9 % 100 mL IVPB     1 g 200 mL/hr over 30 Minutes Intravenous  Once 10/17/18 1756 10/17/18 2111       Subjective: Patient seen and evaluated today with no new acute complaints or concerns. No acute concerns or events noted overnight.  Objective: Vitals:   10/23/18 0500 10/23/18 1300 10/23/18 2047 10/24/18 0454  BP:  112/78 (!) 93/57 113/60  Pulse:  71 68 70  Resp:  19 16 15   Temp:  98 F (36.7 C) 97.8 F (36.6 C) 97.6 F (36.4 C)  TempSrc:  Oral Oral   SpO2:  99% 99% 99%  Weight: 63.5 kg   63 kg  Height: 5' 3"  (1.6 m)       Intake/Output Summary (Last 24 hours) at 10/24/2018 1453 Last data filed at 10/24/2018 1030 Gross per 24 hour  Intake 720 ml  Output 900 ml  Net -180 ml   Filed Weights   10/22/18 0621 10/23/18 0500 10/24/18 0454  Weight: 63.1 kg 63.5 kg 63 kg    Examination:  General exam: Appears calm and comfortable  Respiratory system: Clear to auscultation. Respiratory effort normal. Cardiovascular system: S1 & S2 heard, RRR. No JVD, murmurs, rubs, gallops or clicks. No pedal edema. Gastrointestinal system: Abdomen is nondistended, soft and nontender. No organomegaly or masses felt. Normal bowel sounds heard. Central nervous system: Alert and oriented. No focal neurological deficits. Extremities: Symmetric 5 x 5 power. Skin: No rashes, lesions or ulcers  Psychiatry: Confused with flat affect    Data Reviewed: I have personally reviewed following labs and imaging studies  CBC: Recent Labs  Lab 10/17/18 1707  10/18/18 0320 10/19/18 0444 10/20/18 0352 10/21/18 0722 10/24/18 0427  WBC 27.1*  --  21.7* 18.5* 16.2* 13.6* 11.2*  NEUTROABS 25.7*  --   --   --   --   --   --   HGB 9.0*   < > 8.7* 9.0* 8.9* 9.7* 8.2*  HCT 27.0*   < > 25.9* 27.1* 26.3* 28.8* 25.1*  MCV 95.4  --  93.8 94.8 91.6 92.9 95.1  PLT 131*  --  125* 122* 139* 183 156   < > = values in this interval not displayed.   Basic Metabolic Panel: Recent Labs  Lab 10/18/18 0320  10/20/18 0352 10/21/18 0722 10/22/18 0418 10/23/18 0509 10/24/18 0427  NA 135   < > 136 137 137 134* 134*  K 4.7   < > 3.3* 3.9 4.2 4.4 4.3  CL 99   < > 107 108 107 103 104  CO2 16*   < > 14* 19* 17* 17* 18*  GLUCOSE 146*   < > 126* 142* 145* 125* 148*  BUN 120*   < > 112* 106* 104* 111* 112*  CREATININE 7.92*   < >  6.13* 5.47* 5.48* 5.43* 5.81*  CALCIUM 8.6*   < > 8.3* 8.2* 8.2* 8.3* 8.0*  MG 2.1  --   --   --   --   --   --   PHOS 7.6*  --   --  4.7* 5.0* 4.5 4.6   < > = values in this interval not displayed.   GFR: Estimated Creatinine Clearance: 6.9 mL/min (A) (by C-G formula based on SCr of 5.81 mg/dL (H)). Liver Function Tests: Recent Labs  Lab 10/18/18 0320 10/21/18 0722 10/22/18 0418 10/23/18 0509 10/24/18 0427  AST 41  --   --   --   --   ALT 23  --   --   --   --   ALKPHOS 142*  --   --   --   --   BILITOT 0.6  --   --   --   --   PROT 5.5*  --   --   --   --   ALBUMIN 2.8* 2.3* 2.2* 2.1* 1.9*   Recent Labs  Lab 10/17/18 1855  LIPASE 23   No results for input(s): AMMONIA in the last 168 hours. Coagulation Profile: No results for input(s): INR, PROTIME in the last 168 hours. Cardiac Enzymes: Recent Labs  Lab 10/18/18 0320  CKTOTAL 79   BNP (last 3 results) Recent Labs    09/13/18 1503  PROBNP 10,359*   HbA1C: No results for input(s): HGBA1C in the  last 72 hours. CBG: Recent Labs  Lab 10/17/18 1704  GLUCAP 109*   Lipid Profile: No results for input(s): CHOL, HDL, LDLCALC, TRIG, CHOLHDL, LDLDIRECT in the last 72 hours. Thyroid Function Tests: No results for input(s): TSH, T4TOTAL, FREET4, T3FREE, THYROIDAB in the last 72 hours. Anemia Panel: No results for input(s): VITAMINB12, FOLATE, FERRITIN, TIBC, IRON, RETICCTPCT in the last 72 hours. Sepsis Labs: Recent Labs  Lab 10/17/18 1729 10/18/18 0320  PROCALCITON  --  47.49  LATICACIDVEN 1.8  --     Recent Results (from the past 240 hour(s))  Blood culture (routine x 2)     Status: Abnormal   Collection Time: 10/17/18  5:07 PM   Specimen: BLOOD RIGHT FOREARM  Result Value Ref Range Status   Specimen Description BLOOD RIGHT FOREARM  Final   Special Requests   Final    BOTTLES DRAWN AEROBIC AND ANAEROBIC Blood Culture adequate volume   Culture  Setup Time   Final    GRAM POSITIVE COCCI IN CLUSTERS IN BOTH AEROBIC AND ANAEROBIC BOTTLES CRITICAL RESULT CALLED TO, READ BACK BY AND VERIFIED WITH: J. FRENS,PHARMD 1258 10/18/2018 TRosalia Hammers Performed at [facility], [place] [address] [place], [place] [address]    Culture STAPHYLOCOCCUS AUREUS (A)  Final   Report Status 10/20/2018 FINAL  Final   Organism ID, Bacteria STAPHYLOCOCCUS AUREUS  Final      Susceptibility   Staphylococcus aureus - MIC*    CIPROFLOXACIN >=8 RESISTANT Resistant     ERYTHROMYCIN >=8 RESISTANT Resistant     GENTAMICIN <=0.5 SENSITIVE Sensitive     OXACILLIN 0.5 SENSITIVE Sensitive     TETRACYCLINE <=1 SENSITIVE Sensitive     VANCOMYCIN <=0.5 SENSITIVE Sensitive     TRIMETH/SULFA <=10 SENSITIVE Sensitive     CLINDAMYCIN <=0.25 SENSITIVE Sensitive     RIFAMPIN <=0.5 SENSITIVE Sensitive     Inducible Clindamycin NEGATIVE Sensitive     * STAPHYLOCOCCUS AUREUS  Blood Culture ID Panel (Reflexed)     Status: Abnormal  Collection Time: 10/17/18  5:07 PM  Result Value Ref Range Status   Enterococcus  species NOT DETECTED NOT DETECTED Final   Listeria monocytogenes NOT DETECTED NOT DETECTED Final   Staphylococcus species DETECTED (A) NOT DETECTED Final    Comment: CRITICAL RESULT CALLED TO, READ BACK BY AND VERIFIED WITH: J. FRENS,PHARMD 1258 10/18/2018 T. TYSOR    Staphylococcus aureus (BCID) DETECTED (A) NOT DETECTED Final    Comment: Methicillin (oxacillin)-resistant Staphylococcus aureus (MRSA). MRSA is predictably resistant to beta-lactam antibiotics (except ceftaroline). Preferred therapy is vancomycin unless clinically contraindicated. Patient requires contact precautions if  hospitalized. CRITICAL RESULT CALLED TO, READ BACK BY AND VERIFIED WITH: J. FRENS,PHARMD 1258 10/18/2018 T. TYSOR    Methicillin resistance DETECTED (A) NOT DETECTED Final    Comment: CRITICAL RESULT CALLED TO, READ BACK BY AND VERIFIED WITH: J. FRENS,PHARMD 1258 10/18/2018 T. TYSOR    Streptococcus species NOT DETECTED NOT DETECTED Final   Streptococcus agalactiae NOT DETECTED NOT DETECTED Final   Streptococcus pneumoniae NOT DETECTED NOT DETECTED Final   Streptococcus pyogenes NOT DETECTED NOT DETECTED Final   Acinetobacter baumannii NOT DETECTED NOT DETECTED Final   Enterobacteriaceae species NOT DETECTED NOT DETECTED Final   Enterobacter cloacae complex NOT DETECTED NOT DETECTED Final   Escherichia coli NOT DETECTED NOT DETECTED Final   Klebsiella oxytoca NOT DETECTED NOT DETECTED Final   Klebsiella pneumoniae NOT DETECTED NOT DETECTED Final   Proteus species NOT DETECTED NOT DETECTED Final   Serratia marcescens NOT DETECTED NOT DETECTED Final   Haemophilus influenzae NOT DETECTED NOT DETECTED Final   Neisseria meningitidis NOT DETECTED NOT DETECTED Final   Pseudomonas aeruginosa NOT DETECTED NOT DETECTED Final   Candida albicans NOT DETECTED NOT DETECTED Final   Candida glabrata NOT DETECTED NOT DETECTED Final   Candida krusei NOT DETECTED NOT DETECTED Final   Candida parapsilosis NOT DETECTED  NOT DETECTED Final   Candida tropicalis NOT DETECTED NOT DETECTED Final    Comment: Performed at [facility], [place] [address], [place], [place] [address]  SARS Coronavirus 2 (CEPHEID- Performed in [facility] lab), Hosp Order     Status: None   Collection Time: 10/17/18  5:19 PM   Specimen: Nasopharyngeal  Result Value Ref Range Status   SARS Coronavirus 2 NEGATIVE NEGATIVE Final    Comment: (NOTE) If result is NEGATIVE SARS-CoV-2 target nucleic acids are NOT DETECTED. The SARS-CoV-2 RNA is generally detectable in upper and lower  respiratory specimens during the acute phase of infection. The lowest  concentration of SARS-CoV-2 viral copies this assay can detect is 250  copies / mL. A negative result does not preclude SARS-CoV-2 infection  and should not be used as the sole basis for treatment or other  patient management decisions.  A negative result may occur with  improper specimen collection / handling, submission of specimen other  than nasopharyngeal swab, presence of viral mutation(s) within the  areas targeted by this assay, and inadequate number of viral copies  (<250 copies / mL). A negative result must be combined with clinical  observations, patient history, and epidemiological information. If result is POSITIVE SARS-CoV-2 target nucleic acids are DETECTED. The SARS-CoV-2 RNA is generally detectable in upper and lower  respiratory specimens dur ing the acute phase of infection.  Positive  results are indicative of active infection with SARS-CoV-2.  Clinical  correlation with patient history and other diagnostic information is  necessary to determine patient infection status.  Positive results do  not rule out  bacterial infection or co-infection with other viruses. If result is PRESUMPTIVE POSTIVE SARS-CoV-2 nucleic acids MAY BE PRESENT.   A presumptive positive result was obtained on the submitted specimen  and confirmed on repeat testing.  While 2019  novel coronavirus  (SARS-CoV-2) nucleic acids may be present in the submitted sample  additional confirmatory testing may be necessary for epidemiological  and / or clinical management purposes  to differentiate between  SARS-CoV-2 and other Sarbecovirus currently known to infect humans.  If clinically indicated additional testing with an alternate test  methodology [phone]) is advised. The SARS-CoV-2 RNA is generally  detectable in upper and lower respiratory sp ecimens during the acute  phase of infection. The expected result is Negative. Fact Sheet for Patients:  StrictlyIdeas.no Fact Sheet for Healthcare Providers: BankingDealers.co.za This test is not yet approved or cleared by the [place] FDA and has been authorized for detection and/or diagnosis of SARS-CoV-2 by FDA under an Emergency Use Authorization (EUA).  This EUA will remain in effect (meaning this test can be used) for the duration of the COVID-19 declaration under Section 564(b)(1) of the Act, 21 U.S.C. section 360bbb-3(b)(1), unless the authorization is terminated or revoked sooner. Performed at [facility], [place] [address] [place], [place] [address]   Urine culture     Status: Abnormal   Collection Time: 10/17/18  5:21 PM   Specimen: Urine, Random  Result Value Ref Range Status   Specimen Description URINE, RANDOM  Final   Special Requests   Final    NONE Performed at [facility], [place] [address] [place], [place] [address]    Culture >=100,000 COLONIES/mL STAPHYLOCOCCUS AUREUS (A)  Final   Report Status 10/19/2018 FINAL  Final   Organism ID, Bacteria STAPHYLOCOCCUS AUREUS (A)  Final      Susceptibility   Staphylococcus aureus - MIC*    CIPROFLOXACIN 4 RESISTANT Resistant     GENTAMICIN <=0.5 SENSITIVE Sensitive     NITROFURANTOIN <=16 SENSITIVE Sensitive     OXACILLIN 0.5 SENSITIVE Sensitive     TETRACYCLINE <=1 SENSITIVE Sensitive      VANCOMYCIN <=0.5 SENSITIVE Sensitive     TRIMETH/SULFA <=10 SENSITIVE Sensitive     CLINDAMYCIN <=0.25 SENSITIVE Sensitive     RIFAMPIN <=0.5 SENSITIVE Sensitive     Inducible Clindamycin NEGATIVE Sensitive     * >=100,000 COLONIES/mL STAPHYLOCOCCUS AUREUS  Blood culture (routine x 2)     Status: Abnormal   Collection Time: 10/17/18  5:23 PM   Specimen: BLOOD  Result Value Ref Range Status   Specimen Description BLOOD LEFT ANTECUBITAL  Final   Special Requests   Final    BOTTLES DRAWN AEROBIC AND ANAEROBIC Blood Culture results may not be optimal due to an inadequate volume of blood received in culture bottles   Culture  Setup Time   Final    GRAM POSITIVE COCCI IN CLUSTERS IN BOTH AEROBIC AND ANAEROBIC BOTTLES CRITICAL RESULT CALLED TO, READ BACK BY AND VERIFIED WITH: J. FRENS,PHARMD 1258 10/18/2018 T. TYSOR    Culture (A)  Final    STAPHYLOCOCCUS AUREUS SUSCEPTIBILITIES PERFORMED ON PREVIOUS CULTURE WITHIN THE LAST 5 DAYS. Performed at [facility], [place] [address], [place], [place] [address]    Report Status 10/20/2018 FINAL  Final  Culture, blood (routine x 2)     Status: None   Collection Time: 10/19/18  4:44 AM   Specimen: BLOOD  Result Value Ref Range Status   Specimen Description BLOOD LEFT  ARM  Final   Special Requests   Final    BOTTLES DRAWN AEROBIC ONLY Blood Culture adequate volume   Culture   Final    NO GROWTH 5 DAYS Performed at [facility], [address], [place], [place] [address]    Report Status 10/24/2018 FINAL  Final  Culture, blood (routine x 2)     Status: None   Collection Time: 10/19/18  4:44 AM   Specimen: BLOOD  Result Value Ref Range Status   Specimen Description BLOOD LEFT ARM  Final   Special Requests   Final    BOTTLES DRAWN AEROBIC ONLY Blood Culture adequate volume   Culture   Final    NO GROWTH 5 DAYS Performed at [facility], [place] [address], [place], [place] [address]    Report Status 10/24/2018 FINAL  Final          Radiology Studies: No results found.      Scheduled Meds: . acetaminophen  1,000 mg Oral TID  . apixaban  2.5 mg Oral BID  . omeprazole  40 mg Oral Daily  . oxyCODONE  2.5 mg Oral Q6H  . polyethylene glycol  17 g Oral BID  . senna-docusate  2 tablet Oral BID  . sodium bicarbonate  650 mg Oral TID  . sodium chloride flush  3 mL Intravenous Once   Continuous Infusions: .  ceFAZolin (ANCEF) IV 1 g (10/23/18 1515)     LOS: 6 days    Time spent: 30 minutes    Laikynn Pollio Darleen Crocker, DO [facility] Pager [phone]  If 7PM-7AM, please contact night-coverage [email or web address] Password [facility] 10/24/2018, 2:53 PM

## 2018-10-24 NOTE — Telephone Encounter (Signed)
Spoke with daughter and confirmed appointment.

## 2018-10-25 LAB — RENAL FUNCTION PANEL
Albumin: 2 g/dL — ABNORMAL LOW (ref 3.5–5.0)
Anion gap: 11 (ref 5–15)
BUN: 117 mg/dL — ABNORMAL HIGH (ref 8–23)
CO2: 20 mmol/L — ABNORMAL LOW (ref 22–32)
Calcium: 8 mg/dL — ABNORMAL LOW (ref 8.9–10.3)
Chloride: 102 mmol/L (ref 98–111)
Creatinine, Ser: 6.17 mg/dL — ABNORMAL HIGH (ref 0.61–1.24)
GFR calc Af Amer: 9 mL/min — ABNORMAL LOW (ref >60)
GFR calc non Af Amer: 7 mL/min — ABNORMAL LOW (ref >60)
Glucose, Bld: 121 mg/dL — ABNORMAL HIGH (ref 70–99)
Phosphorus: 5.1 mg/dL — ABNORMAL HIGH (ref 2.5–4.6)
Potassium: 4.9 mmol/L (ref 3.5–5.1)
Sodium: 133 mmol/L — ABNORMAL LOW (ref 135–145)

## 2018-10-25 MED ORDER — MORPHINE SULFATE (PF) 4 MG/ML IV SOLN
3.0000 mg | INTRAVENOUS | Status: DC | PRN
  Administered 2018-10-25: 3 mg via INTRAVENOUS
  Filled 2018-10-25: qty 1

## 2018-10-25 NOTE — Progress Notes (Signed)
[facility] bed had [facility] at patient's home. RN called [employer] for transportation to pt's home. Transportation stated he is on the list and will come as soon as possible. Patient's wife is aware. Will continue to monitor.

## 2018-10-25 NOTE — TOC Initial Note (Addendum)
Transition of Care [facility]) - Initial/Assessment Note    Patient Details  Name: Ronald [name] MRN: [id number] Date of Birth: 1928/03/25  Transition of Care [facility]) CM/SW Contact:    Eileen Stanford, LCSW Phone Number: 10/25/2018, 1:22 PM  Clinical Narrative:    CSW spoke with pt's spouse via telephone. Pt's spouse states she want's pt to d/c home with hospice services in place. Pt is active with [facility]. Pt's spouse has been working with [employer] in order to get the hospital bed delivered today. CSW has left a voicemail with [facility] in order to discuss transportation.        Spoke with Latanya Presser, Mrs. Pote will call RN when hospital bed has been delivered to the house. Messaged MD regarding d/c order and summary. In the case that pt d/c after CSW has left RN to call [facility] [phone]) for d/c. Pt can not transport via [facility] because pt is a hospice pt. Please be sure to let [facility] know pt is a hospice pt.    Expected Discharge Plan: Home w Hospice Care Barriers to Discharge: No Barriers Identified   Patient Goals and CMS Choice        Expected Discharge Plan and Services Expected Discharge Plan: [place] In-house Referral: NA   Post Acute Care Choice: Hospice Living arrangements for the past 2 months: Single Family Home                                      Prior Living Arrangements/Services Living arrangements for the past 2 months: Single Family Home Lives with:: Spouse Patient language and need for interpreter reviewed:: Yes Do you feel safe going back to the place where you live?: Yes      Need for Family Participation in Patient Care: Yes (Comment) Care giver support system in place?: Yes (comment)   Criminal Activity/Legal Involvement Pertinent to Current Situation/Hospitalization: No - Comment as needed  Activities of Daily Living Home Assistive Devices/Equipment: Walker (specify type) ADL Screening (condition at time of  admission) Patient's cognitive ability adequate to safely complete daily activities?: No Is the patient deaf or have difficulty hearing?: Yes Does the patient have difficulty seeing, even when wearing glasses/contacts?: No Does the patient have difficulty concentrating, remembering, or making decisions?: No Patient able to express need for assistance with ADLs?: Yes Does the patient have difficulty dressing or bathing?: Yes Independently performs ADLs?: No Communication: Independent Dressing (OT): Needs assistance Is this a change from baseline?: Pre-admission baseline Grooming: Needs assistance Is this a change from baseline?: Pre-admission baseline Bathing: Needs assistance Is this a change from baseline?: Pre-admission baseline Toileting: Needs assistance Is this a change from baseline?: Pre-admission baseline In/Out Bed: Needs assistance Is this a change from baseline?: Pre-admission baseline Walks in Home: Independent with device (comment) Does the patient have difficulty walking or climbing stairs?: Yes Weakness of Legs: Both Weakness of Arms/Hands: None  Permission Sought/Granted Permission sought to share information with : [employer], Customer service manager    Share Information with NAME: [name]  Permission granted to share info w AGENCY: [facility]  Permission granted to share info w Relationship: Spouse     Emotional Assessment Appearance:: Appears stated age Attitude/Demeanor/Rapport: Unable to Assess Affect (typically observed): Unable to Assess Orientation: : Oriented to Self, Oriented to Place Alcohol / Substance Use: Not Applicable Psych Involvement: No (comment)  Admission diagnosis:  Weakness [place]alph[place].1]  AKI (acute kidney injury) ([place]) [N17.9] Urinary tract infection without hematuria, site unspecified [N39.0] Sepsis ([place]) [A41.9] Patient Active Problem List   Diagnosis Date Noted  . DNR (do not resuscitate)   . MSSA bacteremia 10/18/2018   . Sepsis ([place]) 10/17/2018  . Bladder spasms   . Goals of care, counseling/discussion   . Chronic bilateral thoracic back pain   . Shortness of breath   . Palliative care by specialist   . Cardiorenal syndrome 10/04/2018  . Compression fracture of thoracic spine, non-traumatic, initial encounter ([place]) 10/04/2018  . Anemia due to chronic kidney disease 09/17/2018  . Acute on chronic renal failure ([place]) 09/14/2018  . Elevated troponin 08/28/2018  . Normocytic anemia 08/28/2018  . UTI (urinary tract infection) 03/06/2018  . Chronic combined systolic (congestive) and diastolic (congestive) heart failure ([place]) 12/26/2017  . PAF (paroxysmal atrial fibrillation) ([place]) 12/26/2017  . Fever 05/11/2017  . AKI (acute kidney injury) ([place])   . Bacteremia due to Pseudomonas 01/21/2016  . Debility 01/21/2016  . History of Bell's palsy   . Gastroesophageal reflux disease without esophagitis   . Coronary artery disease involving coronary bypass graft of native heart without angina pectoris   . History of prostate cancer   . Suprapubic catheter ([place])   . Incontinence of feces   . Renal failure syndrome   . Bacteremia   . NSTEMI (non-ST elevated myocardial infarction) ([place]) 01/16/2016  . ARF (acute renal failure) ([place]) 10/13/2015  . Influenza A 12/05/2014  . Urinary tract infection associated with cystostomy catheter ([place]) 10/03/2014  . Acute lower UTI 09/10/2014  . Bacteremia due to Enterococcus 01/22/2014  . Urethral stricture 12/25/2013  . Bladder calculus 12/25/2013  . LV dysfunction 09/23/2013  . Unstable angina ([place]) 08/27/2013  . Bacteremia due to Escherichia coli 05/20/2012  . Urinary tract infection 09/15/2011  . Calculus of kidney 09/12/2011  . HYPERCHOLESTEROLEMIA 10/18/2007  . HTN (hypertension) 10/18/2007  . OSTEOARTHRITIS 02/07/2007  . BELL'S PALSY, RIGHT 10/26/2006  . Coronary atherosclerosis 10/26/2006  . PROSTATE CANCER, HX OF 10/26/2006  . NEPHROLITHIASIS, HX OF  10/26/2006   PCP:  Isaac Bliss, [name], MD Pharmacy:   [id number] - [place], [address] [address] Phone: [phone] Fax: [phone]  [place], [address] [place] [address] Phone: [phone] Fax: [phone]     Social Determinants of Health (SDOH) Interventions    Readmission Risk Interventions Readmission Risk Prevention Plan 09/20/2018  Transportation Screening Complete  Medication Review Press photographer) Complete  PCP or Specialist appointment within 3-5 days of discharge Complete  HRI or [place] Complete  SW Recovery Care/Counseling Consult Complete  [place] Not Applicable  Some recent data might be hidden

## 2018-10-25 NOTE — Progress Notes (Signed)
Occupational Therapy Discharge Patient Details Name: Ronald [name] MRN: [id number] DOB: Aug 12, 1927 Today's Date: 10/25/2018 Time:  -     Patient discharged from OT services secondary to Pt now transitioning to full comfort care..  Please see latest therapy progress note for current level of functioning and progress toward goals.    Progress and discharge plan discussed with patient and/or caregiver: Did not discuss with family as Pt now full comfort care and further acute OT not indicated   Falls, [place], OTR/L Acute Rehabilitation Services Pager [phone] Office [phone]  10/25/2018, 6:31 AM

## 2018-10-25 NOTE — Discharge Summary (Signed)
Physician Discharge Summary  Ronald [name] GYJ:[id number] DOB: March 13, 1928 DOA: 10/17/2018  PCP: Isaac Bliss, [name], MD  Admit date: 10/17/2018 Discharge date: 10/25/2018  Disposition: Hospice at home  Discharge Condition: Guarded CODE STATUS: DNR Diet recommendation: As tolerated  Brief/Interim Summary: Mr. Higginbotham an 85 year old male withpast medical history relevant forh/oprostate cancer,CAD s/p CABG in 1989 and redo bypass in 1997, HTN, HLD, mild dementia, prostate cancer and atrial fibrillation,history of kidney stones and urethral stricturesp suprapubic catheter, GERDadmitted on 7/22/2020with generalized weakness, fever, and chills and found to haveMSSA bacteremia as well as MSSA in his urine.TTE without evidence of endocarditis.Previously on hospice and determining goals of care. 7/29: Discussion had between family and PCP Dr. Jerilee Hoh earlier this afternoon.  It seems that patient and family would not desire any further aggressive care to include continuation of IV antibiotics and PICC line placement, nor would they like nephrostomy tubes to be placed. Plan for discharge to home with hospice care on 7/30.  Discharge Diagnoses:  Active Problems:   HYPERCHOLESTEROLEMIA   HTN (hypertension)   PROSTATE CANCER, HX OF   Urinary tract infection   Urinary tract infection associated with cystostomy catheter (HCC)   Coronary artery disease involving coronary bypass graft of native heart without angina pectoris   History of prostate cancer   Suprapubic catheter ([place])   Debility   AKI (acute kidney injury) ([place])   Chronic combined systolic (congestive) and diastolic (congestive) heart failure (HCC)   PAF (paroxysmal atrial fibrillation) (HCC)   Acute on chronic renal failure (HCC)   Anemia due to chronic kidney disease   Sepsis ([place])   MSSA bacteremia   DNR (do not resuscitate)    Discharge Instructions  Discharge Instructions    Diet - low sodium heart  healthy   Complete by: As directed    Increase activity slowly   Complete by: As directed      Allergies as of 10/25/2018      Reactions   Lidocaine Shortness Of Breath, Swelling   Mouth swelling   Pantoprazole Other (See Comments)   Made the patient lightheaded and dizzy   Nitrofurantoin Hives, Other (See Comments)   Also made the patient lightheaded and dizzy   Sulfa Antibiotics Hives, Itching   Tape Other (See Comments)   TAPE WILL TEAR THE SKIN!!!      Medication List    STOP taking these medications   ciprofloxacin 500 MG tablet Commonly known as: CIPRO     TAKE these medications   acetaminophen 500 MG tablet Commonly known as: TYLENOL Take 500 mg by mouth every 6 (six) hours as needed for mild pain, fever or headache.   atorvastatin 80 MG tablet Commonly known as: LIPITOR Take 0.5 tablets (40 mg total) by mouth every evening.   Eliquis 5 MG Tabs tablet Generic drug: apixaban Take 2.5 mg by mouth 2 (two) times daily.   feeding supplement (NEPRO CARB STEADY) Liqd Take 237 mLs by mouth 3 (three) times daily as needed (Supplement). What changed: reasons to take this   flavoxATE 100 MG tablet Commonly known as: URISPAS Take 1 tablet (100 mg total) by mouth 3 (three) times daily as needed for bladder spasms (1st dose now). What changed: reasons to take this   furosemide 20 MG tablet Commonly known as: LASIX Take 1 tablet (20 mg total) by mouth daily.   isosorbide mononitrate 30 MG 24 hr tablet Commonly known as: IMDUR Take 0.5 tablets (15 mg total) by mouth daily.  multivitamin with minerals Tabs tablet Take 1 tablet by mouth daily with breakfast.   nitroGLYCERIN 0.4 MG SL tablet Commonly known as: NITROSTAT Place 0.4 mg under the tongue every 5 (five) minutes as needed for chest pain.   omeprazole 40 MG capsule Commonly known as: PRILOSEC Take 1 capsule (40 mg total) by mouth daily. What changed: when to take this   ondansetron 4 MG disintegrating  tablet Commonly known as: Zofran ODT Take 1 tablet (4 mg total) by mouth every 8 (eight) hours as needed for nausea or vomiting.   oxyCODONE 5 MG immediate release tablet Commonly known as: Oxy IR/ROXICODONE Take 1 tablet (5 mg total) by mouth every 4 (four) hours as needed for severe pain.   sorbitol 70 % Soln Take 30 mLs by mouth as needed for moderate constipation. What changed: how much to take   Systane 0.4-0.3 % Soln Generic drug: Polyethyl Glycol-Propyl Glycol Place 1 drop into both eyes 3 (three) times daily.   TUMS PO Take 1-3 tablets by mouth as needed (for gas or indigestion).       Allergies  Allergen Reactions  . Lidocaine Shortness Of Breath and Swelling    Mouth swelling   . Pantoprazole Other (See Comments)    Made the patient lightheaded and dizzy  . Nitrofurantoin Hives and Other (See Comments)    Also made the patient lightheaded and dizzy  . Sulfa Antibiotics Hives and Itching  . Tape Other (See Comments)    TAPE WILL TEAR THE SKIN!!!   Procedures/Studies: Ct Abdomen Pelvis Wo Contrast  Result Date: 10/17/2018 CLINICAL DATA:  Fever, body aches, and cystitis. Evaluate for pyelonephritis. EXAM: CT ABDOMEN AND PELVIS WITHOUT CONTRAST TECHNIQUE: Multidetector CT imaging of the abdomen and pelvis was performed following the standard protocol without IV contrast. COMPARISON:  Renal ultrasound dated October 04, 2018. CT abdomen pelvis dated September 15, 2018. FINDINGS: Lower chest: New trace left pleural effusion. Unchanged cardiomegaly and bibasilar scarring. Hepatobiliary: No focal liver abnormality is seen. Status post cholecystectomy. No biliary dilatation. Pancreas: Atrophic. No ductal dilatation or surrounding inflammatory changes. Spleen: Normal in size without focal abnormality. Adrenals/Urinary Tract: The adrenal glands are unremarkable. Unchanged moderate to severe bilateral hydroureteronephrosis. Unchanged 5.8 cm right renal simple cyst. The bladder remains  decompressed by a suprapubic catheter. Stomach/Bowel: Stomach is within normal limits. Appendix appears normal. No evidence of bowel wall thickening, distention, or inflammatory changes. Unchanged sigmoid diverticulosis. Vascular/Lymphatic: Aortic atherosclerosis. No enlarged abdominal or pelvic lymph nodes. Reproductive: Prostatic radiation seed implants again noted. Other: Unchanged small fat containing bilateral inguinal hernias. No free fluid or pneumoperitoneum. Musculoskeletal: No acute or significant osseous findings. IMPRESSION: 1. Evaluation for pyelonephritis is limited without intravenous contrast. Unchanged moderate to severe bilateral hydroureteronephrosis. 2. New trace left pleural effusion. 3.  Aortic atherosclerosis (ICD10-I70.0). Electronically Signed   By: Titus Dubin M.D.   On: 10/17/2018 19:37   Dg Chest 2 View  Result Date: 10/04/2018 CLINICAL DATA:  SOB,SWELLING IN LEGS,FLUID OVERLOAD,HX CHF,HTN,MI EXAM: CHEST - 2 VIEW COMPARISON:  Prior studies including 09/14/2018 FINDINGS: Median sternotomy and CABG. The heart is enlarged. There is shallow lung inflation. Minimal atelectasis is identified at both lung bases. There are no focal consolidations, pleural effusions, or pulmonary edema. Numerous thoracic compression fractures. IMPRESSION: 1. Cardiomegaly. 2. Bibasilar atelectasis. 3. Lungs otherwise clear. Electronically Signed   By: Nolon Nations M.D.   On: 10/04/2018 12:59   US Renal  Result Date: 10/04/2018 CLINICAL DATA:  Acute renal failure EXAM: RENAL /  URINARY TRACT ULTRASOUND COMPLETE COMPARISON:  CT from 09/15/2018 FINDINGS: Right Kidney: Renal measurements: 12.3 x 4.9 x 6.1 cm = volume: 198 mL. Moderate hydronephrosis is noted. There is a 5.6 cm cyst identified similar to that seen on prior CT examination. Left Kidney: Renal measurements: 11.7 x 5.8 x 5.6 cm = volume: 201 mL. Moderate hydronephrosis is noted similar to that seen on the right. Bladder: Bladder is decompressed  IMPRESSION: Bilateral hydronephrosis is noted similar to that seen on prior CT examination. Right renal cyst stable in appearance. Electronically Signed   By: [name] Catalina M.D.   On: 10/04/2018 23:20   Dg Chest Portable 1 View  Result Date: 10/17/2018 CLINICAL DATA:  Fever today, shortness of breath and body aches for 3 months, history hypertension, coronary artery disease post MI, CHF EXAM: PORTABLE CHEST 1 VIEW COMPARISON:  Portable exam 1659 hours compared to 10/04/2018 FINDINGS: Enlargement of cardiac silhouette post CABG. Mediastinal contours and pulmonary vascularity normal. Mild chronic elevation of LEFT diaphragm with mild LEFT basilar subsegmental atelectasis. Upper lungs clear. No pleural effusion or pneumothorax. Bones demineralized. IMPRESSION: Subsegmental atelectasis LEFT base. Enlargement of cardiac silhouette post CABG. Electronically Signed   By: Lavonia Dana M.D.   On: 10/17/2018 17:33    Subjective: No acute issues or events overnight - pt continues to complain of hip/leg pain but moderately well controlled on current regimen.   Discharge Exam: Vitals:   10/25/18 0407 10/25/18 0930  BP: 123/67 125/64  Pulse: 68   Resp:    Temp: 97.7 F (36.5 C)   SpO2: 100% 100%   Vitals:   10/24/18 1511 10/24/18 2110 10/25/18 0407 10/25/18 0930  BP: 112/66 125/66 123/67 125/64  Pulse: 69 69 68   Resp: 15     Temp: 97.7 F (36.5 C) (!) 97.5 F (36.4 C) 97.7 F (36.5 C)   TempSrc: Oral Oral Oral   SpO2: 98% 99% 100% 100%  Weight:   61.9 kg   Height:        General:  Pleasantly resting in bed, No acute distress. HEENT:  Normocephalic atraumatic.  Sclerae nonicteric, noninjected.  Extraocular movements intact bilaterally. Neck:  Without mass or deformity.  Trachea is midline. Lungs:  Clear to auscultate bilaterally without rhonchi, wheeze, or rales. Heart:  Regular rate and rhythm.  Without murmurs, rubs, or gallops. Abdomen:  Soft, nontender, nondistended.  Without guarding or  rebound. Extremities: Without cyanosis, clubbing, edema, or obvious deformity. Vascular:  Dorsalis pedis and posterior tibial pulses palpable bilaterally. Skin:  Warm and dry, no erythema, no ulcerations.   The results of significant diagnostics from this hospitalization (including imaging, microbiology, ancillary and laboratory) are listed below for reference.     Microbiology: Recent Results (from the past 240 hour(s))  Blood culture (routine x 2)     Status: Abnormal   Collection Time: 10/17/18  5:07 PM   Specimen: BLOOD RIGHT FOREARM  Result Value Ref Range Status   Specimen Description BLOOD RIGHT FOREARM  Final   Special Requests   Final    BOTTLES DRAWN AEROBIC AND ANAEROBIC Blood Culture adequate volume   Culture  Setup Time   Final    GRAM POSITIVE COCCI IN CLUSTERS IN BOTH AEROBIC AND ANAEROBIC BOTTLES CRITICAL RESULT CALLED TO, READ BACK BY AND VERIFIED WITH: J. FRENS,PHARMD 1258 10/18/2018 TRosalia Hammers Performed at [facility], [place] [address], [place], [place] [address]    Culture STAPHYLOCOCCUS AUREUS (A)  Final   Report Status 10/20/2018 FINAL  Final   Organism ID, Bacteria STAPHYLOCOCCUS AUREUS  Final      Susceptibility   Staphylococcus aureus - MIC*    CIPROFLOXACIN >=8 RESISTANT Resistant     ERYTHROMYCIN >=8 RESISTANT Resistant     GENTAMICIN <=0.5 SENSITIVE Sensitive     OXACILLIN 0.5 SENSITIVE Sensitive     TETRACYCLINE <=1 SENSITIVE Sensitive     VANCOMYCIN <=0.5 SENSITIVE Sensitive     TRIMETH/SULFA <=10 SENSITIVE Sensitive     CLINDAMYCIN <=0.25 SENSITIVE Sensitive     RIFAMPIN <=0.5 SENSITIVE Sensitive     Inducible Clindamycin NEGATIVE Sensitive     * STAPHYLOCOCCUS AUREUS  Blood Culture ID Panel (Reflexed)     Status: Abnormal   Collection Time: 10/17/18  5:07 PM  Result Value Ref Range Status   Enterococcus species NOT DETECTED NOT DETECTED Final   Listeria monocytogenes NOT DETECTED NOT DETECTED Final   Staphylococcus species DETECTED (A)  NOT DETECTED Final    Comment: CRITICAL RESULT CALLED TO, READ BACK BY AND VERIFIED WITH: J. FRENS,PHARMD 1258 10/18/2018 T. TYSOR    Staphylococcus aureus (BCID) DETECTED (A) NOT DETECTED Final    Comment: Methicillin (oxacillin)-resistant Staphylococcus aureus (MRSA). MRSA is predictably resistant to beta-lactam antibiotics (except ceftaroline). Preferred therapy is vancomycin unless clinically contraindicated. Patient requires contact precautions if  hospitalized. CRITICAL RESULT CALLED TO, READ BACK BY AND VERIFIED WITH: J. FRENS,PHARMD 1258 10/18/2018 T. TYSOR    Methicillin resistance DETECTED (A) NOT DETECTED Final    Comment: CRITICAL RESULT CALLED TO, READ BACK BY AND VERIFIED WITH: J. FRENS,PHARMD 1258 10/18/2018 T. TYSOR    Streptococcus species NOT DETECTED NOT DETECTED Final   Streptococcus agalactiae NOT DETECTED NOT DETECTED Final   Streptococcus pneumoniae NOT DETECTED NOT DETECTED Final   Streptococcus pyogenes NOT DETECTED NOT DETECTED Final   Acinetobacter baumannii NOT DETECTED NOT DETECTED Final   Enterobacteriaceae species NOT DETECTED NOT DETECTED Final   Enterobacter cloacae complex NOT DETECTED NOT DETECTED Final   Escherichia coli NOT DETECTED NOT DETECTED Final   Klebsiella oxytoca NOT DETECTED NOT DETECTED Final   Klebsiella pneumoniae NOT DETECTED NOT DETECTED Final   Proteus species NOT DETECTED NOT DETECTED Final   Serratia marcescens NOT DETECTED NOT DETECTED Final   Haemophilus influenzae NOT DETECTED NOT DETECTED Final   Neisseria meningitidis NOT DETECTED NOT DETECTED Final   Pseudomonas aeruginosa NOT DETECTED NOT DETECTED Final   Candida albicans NOT DETECTED NOT DETECTED Final   Candida glabrata NOT DETECTED NOT DETECTED Final   Candida krusei NOT DETECTED NOT DETECTED Final   Candida parapsilosis NOT DETECTED NOT DETECTED Final   Candida tropicalis NOT DETECTED NOT DETECTED Final    Comment: Performed at [facility], [place] [address], [place], [place] [address]  SARS Coronavirus 2 (CEPHEID- Performed in [facility] lab), Hosp Order     Status: None   Collection Time: 10/17/18  5:19 PM   Specimen: Nasopharyngeal  Result Value Ref Range Status   SARS Coronavirus 2 NEGATIVE NEGATIVE Final    Comment: (NOTE) If result is NEGATIVE SARS-CoV-2 target nucleic acids are NOT DETECTED. The SARS-CoV-2 RNA is generally detectable in upper and lower  respiratory specimens during the acute phase of infection. The lowest  concentration of SARS-CoV-2 viral copies this assay can detect is 250  copies / mL. A negative result does not preclude SARS-CoV-2 infection  and should not be used as the sole basis for treatment or other  patient management decisions.  A negative result may  occur with  improper specimen collection / handling, submission of specimen other  than nasopharyngeal swab, presence of viral mutation(s) within the  areas targeted by this assay, and inadequate number of viral copies  (<250 copies / mL). A negative result must be combined with clinical  observations, patient history, and epidemiological information. If result is POSITIVE SARS-CoV-2 target nucleic acids are DETECTED. The SARS-CoV-2 RNA is generally detectable in upper and lower  respiratory specimens dur ing the acute phase of infection.  Positive  results are indicative of active infection with SARS-CoV-2.  Clinical  correlation with patient history and other diagnostic information is  necessary to determine patient infection status.  Positive results do  not rule out bacterial infection or co-infection with other viruses. If result is PRESUMPTIVE POSTIVE SARS-CoV-2 nucleic acids MAY BE PRESENT.   A presumptive positive result was obtained on the submitted specimen  and confirmed on repeat testing.  While 2019 novel coronavirus  (SARS-CoV-2) nucleic acids may be present in the submitted sample  additional confirmatory testing may be necessary for  epidemiological  and / or clinical management purposes  to differentiate between  SARS-CoV-2 and other Sarbecovirus currently known to infect humans.  If clinically indicated additional testing with an alternate test  methodology [phone]) is advised. The SARS-CoV-2 RNA is generally  detectable in upper and lower respiratory sp ecimens during the acute  phase of infection. The expected result is Negative. Fact Sheet for Patients:  StrictlyIdeas.no Fact Sheet for Healthcare Providers: BankingDealers.co.za This test is not yet approved or cleared by the [place] FDA and has been authorized for detection and/or diagnosis of SARS-CoV-2 by FDA under an Emergency Use Authorization (EUA).  This EUA will remain in effect (meaning this test can be used) for the duration of the COVID-19 declaration under Section 564(b)(1) of the Act, 21 U.S.C. section 360bbb-3(b)(1), unless the authorization is terminated or revoked sooner. Performed at [facility], [place] [address], [place], [place] [address]   Urine culture     Status: Abnormal   Collection Time: 10/17/18  5:21 PM   Specimen: Urine, Random  Result Value Ref Range Status   Specimen Description URINE, RANDOM  Final   Special Requests   Final    NONE Performed at [facility], [place] [address] [place], [place] [address]    Culture >=100,000 COLONIES/mL STAPHYLOCOCCUS AUREUS (A)  Final   Report Status 10/19/2018 FINAL  Final   Organism ID, Bacteria STAPHYLOCOCCUS AUREUS (A)  Final      Susceptibility   Staphylococcus aureus - MIC*    CIPROFLOXACIN 4 RESISTANT Resistant     GENTAMICIN <=0.5 SENSITIVE Sensitive     NITROFURANTOIN <=16 SENSITIVE Sensitive     OXACILLIN 0.5 SENSITIVE Sensitive     TETRACYCLINE <=1 SENSITIVE Sensitive     VANCOMYCIN <=0.5 SENSITIVE Sensitive     TRIMETH/SULFA <=10 SENSITIVE Sensitive     CLINDAMYCIN <=0.25 SENSITIVE Sensitive     RIFAMPIN <=0.5  SENSITIVE Sensitive     Inducible Clindamycin NEGATIVE Sensitive     * >=100,000 COLONIES/mL STAPHYLOCOCCUS AUREUS  Blood culture (routine x 2)     Status: Abnormal   Collection Time: 10/17/18  5:23 PM   Specimen: BLOOD  Result Value Ref Range Status   Specimen Description BLOOD LEFT ANTECUBITAL  Final   Special Requests   Final    BOTTLES DRAWN AEROBIC AND ANAEROBIC Blood Culture results may not be optimal due to an inadequate volume of blood received  in culture bottles   Culture  Setup Time   Final    GRAM POSITIVE COCCI IN CLUSTERS IN BOTH AEROBIC AND ANAEROBIC BOTTLES CRITICAL RESULT CALLED TO, READ BACK BY AND VERIFIED WITH: J. FRENS,PHARMD 1258 10/18/2018 T. TYSOR    Culture (A)  Final    STAPHYLOCOCCUS AUREUS SUSCEPTIBILITIES PERFORMED ON PREVIOUS CULTURE WITHIN THE LAST 5 DAYS. Performed at [facility], [place] [address] [place], [place] [address]    Report Status 10/20/2018 FINAL  Final  Culture, blood (routine x 2)     Status: None   Collection Time: 10/19/18  4:44 AM   Specimen: BLOOD  Result Value Ref Range Status   Specimen Description BLOOD LEFT ARM  Final   Special Requests   Final    BOTTLES DRAWN AEROBIC ONLY Blood Culture adequate volume   Culture   Final    NO GROWTH 5 DAYS Performed at [facility], [place] [address], [place], [place] [address]    Report Status 10/24/2018 FINAL  Final  Culture, blood (routine x 2)     Status: None   Collection Time: 10/19/18  4:44 AM   Specimen: BLOOD  Result Value Ref Range Status   Specimen Description BLOOD LEFT ARM  Final   Special Requests   Final    BOTTLES DRAWN AEROBIC ONLY Blood Culture adequate volume   Culture   Final    NO GROWTH 5 DAYS Performed at [facility], [place] [address] [place], [place] [address]    Report Status 10/24/2018 FINAL  Final     Labs: BNP (last 3 results) Recent Labs    12/26/17 0349 08/28/18 1536 10/04/18 1252  BNP 280.6* 441.0* 993.7*   Basic Metabolic  Panel: Recent Labs  Lab 10/21/18 0722 10/22/18 0418 10/23/18 0509 10/24/18 0427 10/25/18 0334  NA 137 137 134* 134* 133*  K 3.9 4.2 4.4 4.3 4.9  CL 108 107 103 104 102  CO2 19* 17* 17* 18* 20*  GLUCOSE 142* 145* 125* 148* 121*  BUN 106* 104* 111* 112* 117*  CREATININE 5.47* 5.48* 5.43* 5.81* 6.17*  CALCIUM 8.2* 8.2* 8.3* 8.0* 8.0*  PHOS 4.7* 5.0* 4.5 4.6 5.1*   Liver Function Tests: Recent Labs  Lab 10/21/18 0722 10/22/18 0418 10/23/18 0509 10/24/18 0427 10/25/18 0334  ALBUMIN 2.3* 2.2* 2.1* 1.9* 2.0*   No results for input(s): LIPASE, AMYLASE in the last 168 hours. No results for input(s): AMMONIA in the last 168 hours. CBC: Recent Labs  Lab 10/19/18 0444 10/20/18 0352 10/21/18 0722 10/24/18 0427  WBC 18.5* 16.2* 13.6* 11.2*  HGB 9.0* 8.9* 9.7* 8.2*  HCT 27.1* 26.3* 28.8* 25.1*  MCV 94.8 91.6 92.9 95.1  PLT 122* 139* 183 156   Cardiac Enzymes: No results for input(s): CKTOTAL, CKMB, CKMBINDEX, TROPONINI in the last 168 hours. BNP: Invalid input(s): POCBNP CBG: No results for input(s): GLUCAP in the last 168 hours. D-Dimer No results for input(s): DDIMER in the last 72 hours. Hgb A1c No results for input(s): HGBA1C in the last 72 hours. Lipid Profile No results for input(s): CHOL, HDL, LDLCALC, TRIG, CHOLHDL, LDLDIRECT in the last 72 hours. Thyroid function studies No results for input(s): TSH, T4TOTAL, T3FREE, THYROIDAB in the last 72 hours.  Invalid input(s): FREET3 Anemia work up No results for input(s): VITAMINB12, FOLATE, FERRITIN, TIBC, IRON, RETICCTPCT in the last 72 hours. Urinalysis    Component Value Date/Time   COLORURINE YELLOW 10/17/2018 1730   APPEARANCEUR TURBID (A) 10/17/2018 1730  LABSPEC 1.013 10/17/2018 1730   PHURINE 5.0 10/17/2018 1730   GLUCOSEU NEGATIVE 10/17/2018 1730   HGBUR LARGE (A) 10/17/2018 1730   BILIRUBINUR NEGATIVE 10/17/2018 1730   BILIRUBINUR neg 06/30/2014 1530   KETONESUR NEGATIVE 10/17/2018 1730    PROTEINUR 100 (A) 10/17/2018 1730   UROBILINOGEN 0.2 12/04/2014 2344   NITRITE NEGATIVE 10/17/2018 1730   LEUKOCYTESUR LARGE (A) 10/17/2018 1730   Sepsis Labs Invalid input(s): PROCALCITONIN,  WBC,  LACTICIDVEN Microbiology Recent Results (from the past 240 hour(s))  Blood culture (routine x 2)     Status: Abnormal   Collection Time: 10/17/18  5:07 PM   Specimen: BLOOD RIGHT FOREARM  Result Value Ref Range Status   Specimen Description BLOOD RIGHT FOREARM  Final   Special Requests   Final    BOTTLES DRAWN AEROBIC AND ANAEROBIC Blood Culture adequate volume   Culture  Setup Time   Final    GRAM POSITIVE COCCI IN CLUSTERS IN BOTH AEROBIC AND ANAEROBIC BOTTLES CRITICAL RESULT CALLED TO, READ BACK BY AND VERIFIED WITH: J. FRENS,PHARMD 1258 10/18/2018 TRosalia Hammers Performed at [facility], [place] [address] [place], [place] [address]    Culture STAPHYLOCOCCUS AUREUS (A)  Final   Report Status 10/20/2018 FINAL  Final   Organism ID, Bacteria STAPHYLOCOCCUS AUREUS  Final      Susceptibility   Staphylococcus aureus - MIC*    CIPROFLOXACIN >=8 RESISTANT Resistant     ERYTHROMYCIN >=8 RESISTANT Resistant     GENTAMICIN <=0.5 SENSITIVE Sensitive     OXACILLIN 0.5 SENSITIVE Sensitive     TETRACYCLINE <=1 SENSITIVE Sensitive     VANCOMYCIN <=0.5 SENSITIVE Sensitive     TRIMETH/SULFA <=10 SENSITIVE Sensitive     CLINDAMYCIN <=0.25 SENSITIVE Sensitive     RIFAMPIN <=0.5 SENSITIVE Sensitive     Inducible Clindamycin NEGATIVE Sensitive     * STAPHYLOCOCCUS AUREUS  Blood Culture ID Panel (Reflexed)     Status: Abnormal   Collection Time: 10/17/18  5:07 PM  Result Value Ref Range Status   Enterococcus species NOT DETECTED NOT DETECTED Final   Listeria monocytogenes NOT DETECTED NOT DETECTED Final   Staphylococcus species DETECTED (A) NOT DETECTED Final    Comment: CRITICAL RESULT CALLED TO, READ BACK BY AND VERIFIED WITH: J. FRENS,PHARMD 1258 10/18/2018 T. TYSOR    Staphylococcus aureus  (BCID) DETECTED (A) NOT DETECTED Final    Comment: Methicillin (oxacillin)-resistant Staphylococcus aureus (MRSA). MRSA is predictably resistant to beta-lactam antibiotics (except ceftaroline). Preferred therapy is vancomycin unless clinically contraindicated. Patient requires contact precautions if  hospitalized. CRITICAL RESULT CALLED TO, READ BACK BY AND VERIFIED WITH: J. FRENS,PHARMD 1258 10/18/2018 T. TYSOR    Methicillin resistance DETECTED (A) NOT DETECTED Final    Comment: CRITICAL RESULT CALLED TO, READ BACK BY AND VERIFIED WITH: J. FRENS,PHARMD 1258 10/18/2018 T. TYSOR    Streptococcus species NOT DETECTED NOT DETECTED Final   Streptococcus agalactiae NOT DETECTED NOT DETECTED Final   Streptococcus pneumoniae NOT DETECTED NOT DETECTED Final   Streptococcus pyogenes NOT DETECTED NOT DETECTED Final   Acinetobacter baumannii NOT DETECTED NOT DETECTED Final   Enterobacteriaceae species NOT DETECTED NOT DETECTED Final   Enterobacter cloacae complex NOT DETECTED NOT DETECTED Final   Escherichia coli NOT DETECTED NOT DETECTED Final   Klebsiella oxytoca NOT DETECTED NOT DETECTED Final   Klebsiella pneumoniae NOT DETECTED NOT DETECTED Final   Proteus species NOT DETECTED NOT DETECTED Final   Serratia marcescens NOT DETECTED NOT DETECTED Final   Haemophilus influenzae  NOT DETECTED NOT DETECTED Final   Neisseria meningitidis NOT DETECTED NOT DETECTED Final   Pseudomonas aeruginosa NOT DETECTED NOT DETECTED Final   Candida albicans NOT DETECTED NOT DETECTED Final   Candida glabrata NOT DETECTED NOT DETECTED Final   Candida krusei NOT DETECTED NOT DETECTED Final   Candida parapsilosis NOT DETECTED NOT DETECTED Final   Candida tropicalis NOT DETECTED NOT DETECTED Final    Comment: Performed at [facility], [place] [address], [place], [place] [address]  SARS Coronavirus 2 (CEPHEID- Performed in [facility] lab), Hosp Order     Status: None   Collection Time: 10/17/18  5:19 PM    Specimen: Nasopharyngeal  Result Value Ref Range Status   SARS Coronavirus 2 NEGATIVE NEGATIVE Final    Comment: (NOTE) If result is NEGATIVE SARS-CoV-2 target nucleic acids are NOT DETECTED. The SARS-CoV-2 RNA is generally detectable in upper and lower  respiratory specimens during the acute phase of infection. The lowest  concentration of SARS-CoV-2 viral copies this assay can detect is 250  copies / mL. A negative result does not preclude SARS-CoV-2 infection  and should not be used as the sole basis for treatment or other  patient management decisions.  A negative result may occur with  improper specimen collection / handling, submission of specimen other  than nasopharyngeal swab, presence of viral mutation(s) within the  areas targeted by this assay, and inadequate number of viral copies  (<250 copies / mL). A negative result must be combined with clinical  observations, patient history, and epidemiological information. If result is POSITIVE SARS-CoV-2 target nucleic acids are DETECTED. The SARS-CoV-2 RNA is generally detectable in upper and lower  respiratory specimens dur ing the acute phase of infection.  Positive  results are indicative of active infection with SARS-CoV-2.  Clinical  correlation with patient history and other diagnostic information is  necessary to determine patient infection status.  Positive results do  not rule out bacterial infection or co-infection with other viruses. If result is PRESUMPTIVE POSTIVE SARS-CoV-2 nucleic acids MAY BE PRESENT.   A presumptive positive result was obtained on the submitted specimen  and confirmed on repeat testing.  While 2019 novel coronavirus  (SARS-CoV-2) nucleic acids may be present in the submitted sample  additional confirmatory testing may be necessary for epidemiological  and / or clinical management purposes  to differentiate between  SARS-CoV-2 and other Sarbecovirus currently known to infect humans.  If  clinically indicated additional testing with an alternate test  methodology [phone]) is advised. The SARS-CoV-2 RNA is generally  detectable in upper and lower respiratory sp ecimens during the acute  phase of infection. The expected result is Negative. Fact Sheet for Patients:  StrictlyIdeas.no Fact Sheet for Healthcare Providers: BankingDealers.co.za This test is not yet approved or cleared by the [place] FDA and has been authorized for detection and/or diagnosis of SARS-CoV-2 by FDA under an Emergency Use Authorization (EUA).  This EUA will remain in effect (meaning this test can be used) for the duration of the COVID-19 declaration under Section 564(b)(1) of the Act, 21 U.S.C. section 360bbb-3(b)(1), unless the authorization is terminated or revoked sooner. Performed at [facility], [place] [address], [place], [place] [address]   Urine culture     Status: Abnormal   Collection Time: 10/17/18  5:21 PM   Specimen: Urine, Random  Result Value Ref Range Status   Specimen Description URINE, RANDOM  Final   Special Requests   Final  NONE Performed at [facility], [place] [address], [place], [place] [address]    Culture >=100,000 COLONIES/mL STAPHYLOCOCCUS AUREUS (A)  Final   Report Status 10/19/2018 FINAL  Final   Organism ID, Bacteria STAPHYLOCOCCUS AUREUS (A)  Final      Susceptibility   Staphylococcus aureus - MIC*    CIPROFLOXACIN 4 RESISTANT Resistant     GENTAMICIN <=0.5 SENSITIVE Sensitive     NITROFURANTOIN <=16 SENSITIVE Sensitive     OXACILLIN 0.5 SENSITIVE Sensitive     TETRACYCLINE <=1 SENSITIVE Sensitive     VANCOMYCIN <=0.5 SENSITIVE Sensitive     TRIMETH/SULFA <=10 SENSITIVE Sensitive     CLINDAMYCIN <=0.25 SENSITIVE Sensitive     RIFAMPIN <=0.5 SENSITIVE Sensitive     Inducible Clindamycin NEGATIVE Sensitive     * >=100,000 COLONIES/mL STAPHYLOCOCCUS AUREUS  Blood culture (routine x 2)     Status:  Abnormal   Collection Time: 10/17/18  5:23 PM   Specimen: BLOOD  Result Value Ref Range Status   Specimen Description BLOOD LEFT ANTECUBITAL  Final   Special Requests   Final    BOTTLES DRAWN AEROBIC AND ANAEROBIC Blood Culture results may not be optimal due to an inadequate volume of blood received in culture bottles   Culture  Setup Time   Final    GRAM POSITIVE COCCI IN CLUSTERS IN BOTH AEROBIC AND ANAEROBIC BOTTLES CRITICAL RESULT CALLED TO, READ BACK BY AND VERIFIED WITH: J. FRENS,PHARMD 1258 10/18/2018 T. TYSOR    Culture (A)  Final    STAPHYLOCOCCUS AUREUS SUSCEPTIBILITIES PERFORMED ON PREVIOUS CULTURE WITHIN THE LAST 5 DAYS. Performed at [facility], [place] [address], [place], [place] [address]    Report Status 10/20/2018 FINAL  Final  Culture, blood (routine x 2)     Status: None   Collection Time: 10/19/18  4:44 AM   Specimen: BLOOD  Result Value Ref Range Status   Specimen Description BLOOD LEFT ARM  Final   Special Requests   Final    BOTTLES DRAWN AEROBIC ONLY Blood Culture adequate volume   Culture   Final    NO GROWTH 5 DAYS Performed at [facility], [place] [address], [place], [place] [address]    Report Status 10/24/2018 FINAL  Final  Culture, blood (routine x 2)     Status: None   Collection Time: 10/19/18  4:44 AM   Specimen: BLOOD  Result Value Ref Range Status   Specimen Description BLOOD LEFT ARM  Final   Special Requests   Final    BOTTLES DRAWN AEROBIC ONLY Blood Culture adequate volume   Culture   Final    NO GROWTH 5 DAYS Performed at [facility], [place] [address] [place], [place] [address]    Report Status 10/24/2018 FINAL  Final     Time coordinating discharge: Over 30 minutes  SIGNED:   Little Ishikawa, DO [facility] 10/25/2018, 1:42 PM Pager   If 7PM-7AM, please contact night-coverage [email or web address] Password [email or web address]

## 2018-10-25 NOTE — Progress Notes (Signed)
[id number] GIP RN Note  Hospital Bed has been ordered and is scheduled to deliver today. Family will notify [facility] and [facility] when DME has been delivered.  Thank you Farrel Gordon, RN, [place] (listed on [place]) [phone]

## 2018-10-25 NOTE — Care Management Important Message (Signed)
Important Message  Patient Details  Name: Ronald [name] MRN: [id number] Date of Birth: [birth year]   Medicare Important Message Given:  Yes     [name] 10/25/2018, 12:47 PM

## 2018-10-26 DIAGNOSIS — I25709 Atherosclerosis of coronary artery bypass graft(s), unspecified, with unspecified angina pectoris: Secondary | ICD-10-CM | POA: Diagnosis not present

## 2018-10-26 DIAGNOSIS — I5042 Chronic combined systolic (congestive) and diastolic (congestive) heart failure: Secondary | ICD-10-CM | POA: Diagnosis not present

## 2018-10-26 DIAGNOSIS — I132 Hypertensive heart and chronic kidney disease with heart failure and with stage 5 chronic kidney disease, or end stage renal disease: Secondary | ICD-10-CM | POA: Diagnosis not present

## 2018-10-26 DIAGNOSIS — F0150 Vascular dementia without behavioral disturbance: Secondary | ICD-10-CM | POA: Diagnosis not present

## 2018-10-26 DIAGNOSIS — N186 End stage renal disease: Secondary | ICD-10-CM | POA: Diagnosis not present

## 2018-10-26 DIAGNOSIS — D631 Anemia in chronic kidney disease: Secondary | ICD-10-CM | POA: Diagnosis not present

## 2018-10-27 DIAGNOSIS — N1330 Unspecified hydronephrosis: Secondary | ICD-10-CM | POA: Diagnosis not present

## 2018-10-27 DIAGNOSIS — I132 Hypertensive heart and chronic kidney disease with heart failure and with stage 5 chronic kidney disease, or end stage renal disease: Secondary | ICD-10-CM | POA: Diagnosis not present

## 2018-10-27 DIAGNOSIS — I480 Paroxysmal atrial fibrillation: Secondary | ICD-10-CM | POA: Diagnosis not present

## 2018-10-27 DIAGNOSIS — I255 Ischemic cardiomyopathy: Secondary | ICD-10-CM | POA: Diagnosis not present

## 2018-10-27 DIAGNOSIS — M4850XD Collapsed vertebra, not elsewhere classified, site unspecified, subsequent encounter for fracture with routine healing: Secondary | ICD-10-CM | POA: Diagnosis not present

## 2018-10-27 DIAGNOSIS — Z960 Presence of urogenital implants: Secondary | ICD-10-CM | POA: Diagnosis not present

## 2018-10-27 DIAGNOSIS — D631 Anemia in chronic kidney disease: Secondary | ICD-10-CM | POA: Diagnosis not present

## 2018-10-27 DIAGNOSIS — Z741 Need for assistance with personal care: Secondary | ICD-10-CM | POA: Diagnosis not present

## 2018-10-27 DIAGNOSIS — N186 End stage renal disease: Secondary | ICD-10-CM | POA: Diagnosis not present

## 2018-10-27 DIAGNOSIS — I5042 Chronic combined systolic (congestive) and diastolic (congestive) heart failure: Secondary | ICD-10-CM | POA: Diagnosis not present

## 2018-10-27 DIAGNOSIS — F0150 Vascular dementia without behavioral disturbance: Secondary | ICD-10-CM | POA: Diagnosis not present

## 2018-10-27 DIAGNOSIS — M549 Dorsalgia, unspecified: Secondary | ICD-10-CM | POA: Diagnosis not present

## 2018-10-27 DIAGNOSIS — I25709 Atherosclerosis of coronary artery bypass graft(s), unspecified, with unspecified angina pectoris: Secondary | ICD-10-CM | POA: Diagnosis not present

## 2018-10-27 DIAGNOSIS — Z6823 Body mass index (BMI) 23.0-23.9, adult: Secondary | ICD-10-CM | POA: Diagnosis not present

## 2018-10-27 DIAGNOSIS — I252 Old myocardial infarction: Secondary | ICD-10-CM | POA: Diagnosis not present

## 2018-10-27 DIAGNOSIS — C61 Malignant neoplasm of prostate: Secondary | ICD-10-CM | POA: Diagnosis not present

## 2018-10-27 DIAGNOSIS — K219 Gastro-esophageal reflux disease without esophagitis: Secondary | ICD-10-CM | POA: Diagnosis not present

## 2018-10-27 DIAGNOSIS — M1990 Unspecified osteoarthritis, unspecified site: Secondary | ICD-10-CM | POA: Diagnosis not present

## 2018-10-27 DIAGNOSIS — Z87440 Personal history of urinary (tract) infections: Secondary | ICD-10-CM | POA: Diagnosis not present

## 2018-10-29 ENCOUNTER — Telehealth: Payer: Self-pay | Admitting: Cardiovascular Disease

## 2018-10-29 ENCOUNTER — Telehealth: Payer: Self-pay | Admitting: Internal Medicine

## 2018-10-29 DIAGNOSIS — N186 End stage renal disease: Secondary | ICD-10-CM | POA: Diagnosis not present

## 2018-10-29 DIAGNOSIS — D631 Anemia in chronic kidney disease: Secondary | ICD-10-CM | POA: Diagnosis not present

## 2018-10-29 DIAGNOSIS — I132 Hypertensive heart and chronic kidney disease with heart failure and with stage 5 chronic kidney disease, or end stage renal disease: Secondary | ICD-10-CM | POA: Diagnosis not present

## 2018-10-29 DIAGNOSIS — I25709 Atherosclerosis of coronary artery bypass graft(s), unspecified, with unspecified angina pectoris: Secondary | ICD-10-CM | POA: Diagnosis not present

## 2018-10-29 DIAGNOSIS — F0150 Vascular dementia without behavioral disturbance: Secondary | ICD-10-CM | POA: Diagnosis not present

## 2018-10-29 DIAGNOSIS — I5042 Chronic combined systolic (congestive) and diastolic (congestive) heart failure: Secondary | ICD-10-CM | POA: Diagnosis not present

## 2018-10-29 NOTE — Telephone Encounter (Signed)
Kenney Houseman with [facility] [phone] called to report that the pt has been having scant bleeding from his gums.. she says his wife notices it in the morning mostly... he has had bad mouth care and his teeth look like they are about to fall out.Marland KitchenMarland KitchenI asked about the message she left about the Suprapubic urinary catheter bleeding and she reports it is minimal and he is being treated with antibiotics for his urine and she feels it may be the disease process since it is not new for the pt...   She is asking even though she is aware of the benefits of taking Eliquis and the risk of not taking it... when would we consider it time to d/c if anytime?   I advised her I will need to send to Dr. Angelena Form for review but to let us know if his bleeding gums worsen and to try to have the pt on a better oral care routine with a soft tooth brush or just rinses if appropriate... to monitor his BP and to let us know if any worsening symptoms of bleeding should occur.

## 2018-10-29 NOTE — Telephone Encounter (Signed)
Tonya calling from [facility] stated that Pt is having increased bleeding from gums for about 2-3 days and she would like to know if the patient should continue to take eliquis. Please advise

## 2018-10-29 NOTE — Telephone Encounter (Signed)
° ° °  Bleeding from his gums for about 3 days  He is also having some bleeding in his cathitor over the weekend , it has stopped in the last 24 hours  But she is wondering if he needs to stop the Eliquis or decrease the dosage     Please call

## 2018-10-30 DIAGNOSIS — N186 End stage renal disease: Secondary | ICD-10-CM | POA: Diagnosis not present

## 2018-10-30 DIAGNOSIS — D631 Anemia in chronic kidney disease: Secondary | ICD-10-CM | POA: Diagnosis not present

## 2018-10-30 DIAGNOSIS — I132 Hypertensive heart and chronic kidney disease with heart failure and with stage 5 chronic kidney disease, or end stage renal disease: Secondary | ICD-10-CM | POA: Diagnosis not present

## 2018-10-30 DIAGNOSIS — I5042 Chronic combined systolic (congestive) and diastolic (congestive) heart failure: Secondary | ICD-10-CM | POA: Diagnosis not present

## 2018-10-30 DIAGNOSIS — F0150 Vascular dementia without behavioral disturbance: Secondary | ICD-10-CM | POA: Diagnosis not present

## 2018-10-30 DIAGNOSIS — I25709 Atherosclerosis of coronary artery bypass graft(s), unspecified, with unspecified angina pectoris: Secondary | ICD-10-CM | POA: Diagnosis not present

## 2018-10-30 NOTE — Telephone Encounter (Signed)
Spoke with Kenney Houseman and waiting on Cardiologist opinion.

## 2018-10-30 NOTE — Telephone Encounter (Signed)
Routing to cardiology to get their opinion, but will likely discontinue Eliquis.

## 2018-10-30 NOTE — Telephone Encounter (Signed)
I received a staff message today from Dr. Isaac Bliss today and responded that we should stop the Eliquis. Can we let them know? Thanks, chris

## 2018-10-31 NOTE — Telephone Encounter (Signed)
Left detailed message on machine for Ronald [name] with orders to stop Eliquis.

## 2018-10-31 NOTE — Telephone Encounter (Signed)
Spoke with the pts wife and she will stop his Eliquis.Marland Kitchen she will let us know or the Hospice nurse if she notices improvement.

## 2018-11-01 DIAGNOSIS — D631 Anemia in chronic kidney disease: Secondary | ICD-10-CM | POA: Diagnosis not present

## 2018-11-01 DIAGNOSIS — I132 Hypertensive heart and chronic kidney disease with heart failure and with stage 5 chronic kidney disease, or end stage renal disease: Secondary | ICD-10-CM | POA: Diagnosis not present

## 2018-11-01 DIAGNOSIS — I25709 Atherosclerosis of coronary artery bypass graft(s), unspecified, with unspecified angina pectoris: Secondary | ICD-10-CM | POA: Diagnosis not present

## 2018-11-01 DIAGNOSIS — N186 End stage renal disease: Secondary | ICD-10-CM | POA: Diagnosis not present

## 2018-11-01 DIAGNOSIS — I5042 Chronic combined systolic (congestive) and diastolic (congestive) heart failure: Secondary | ICD-10-CM | POA: Diagnosis not present

## 2018-11-01 DIAGNOSIS — F0150 Vascular dementia without behavioral disturbance: Secondary | ICD-10-CM | POA: Diagnosis not present

## 2018-11-04 DIAGNOSIS — N186 End stage renal disease: Secondary | ICD-10-CM | POA: Diagnosis not present

## 2018-11-04 DIAGNOSIS — I25709 Atherosclerosis of coronary artery bypass graft(s), unspecified, with unspecified angina pectoris: Secondary | ICD-10-CM | POA: Diagnosis not present

## 2018-11-04 DIAGNOSIS — I5042 Chronic combined systolic (congestive) and diastolic (congestive) heart failure: Secondary | ICD-10-CM | POA: Diagnosis not present

## 2018-11-04 DIAGNOSIS — I132 Hypertensive heart and chronic kidney disease with heart failure and with stage 5 chronic kidney disease, or end stage renal disease: Secondary | ICD-10-CM | POA: Diagnosis not present

## 2018-11-04 DIAGNOSIS — F0150 Vascular dementia without behavioral disturbance: Secondary | ICD-10-CM | POA: Diagnosis not present

## 2018-11-04 DIAGNOSIS — D631 Anemia in chronic kidney disease: Secondary | ICD-10-CM | POA: Diagnosis not present

## 2018-11-05 DIAGNOSIS — F0150 Vascular dementia without behavioral disturbance: Secondary | ICD-10-CM | POA: Diagnosis not present

## 2018-11-05 DIAGNOSIS — I5042 Chronic combined systolic (congestive) and diastolic (congestive) heart failure: Secondary | ICD-10-CM | POA: Diagnosis not present

## 2018-11-05 DIAGNOSIS — I132 Hypertensive heart and chronic kidney disease with heart failure and with stage 5 chronic kidney disease, or end stage renal disease: Secondary | ICD-10-CM | POA: Diagnosis not present

## 2018-11-05 DIAGNOSIS — D631 Anemia in chronic kidney disease: Secondary | ICD-10-CM | POA: Diagnosis not present

## 2018-11-05 DIAGNOSIS — N186 End stage renal disease: Secondary | ICD-10-CM | POA: Diagnosis not present

## 2018-11-05 DIAGNOSIS — I25709 Atherosclerosis of coronary artery bypass graft(s), unspecified, with unspecified angina pectoris: Secondary | ICD-10-CM | POA: Diagnosis not present

## 2018-11-08 DIAGNOSIS — F0150 Vascular dementia without behavioral disturbance: Secondary | ICD-10-CM | POA: Diagnosis not present

## 2018-11-08 DIAGNOSIS — I132 Hypertensive heart and chronic kidney disease with heart failure and with stage 5 chronic kidney disease, or end stage renal disease: Secondary | ICD-10-CM | POA: Diagnosis not present

## 2018-11-08 DIAGNOSIS — D631 Anemia in chronic kidney disease: Secondary | ICD-10-CM | POA: Diagnosis not present

## 2018-11-08 DIAGNOSIS — N186 End stage renal disease: Secondary | ICD-10-CM | POA: Diagnosis not present

## 2018-11-08 DIAGNOSIS — I5042 Chronic combined systolic (congestive) and diastolic (congestive) heart failure: Secondary | ICD-10-CM | POA: Diagnosis not present

## 2018-11-08 DIAGNOSIS — I25709 Atherosclerosis of coronary artery bypass graft(s), unspecified, with unspecified angina pectoris: Secondary | ICD-10-CM | POA: Diagnosis not present

## 2018-11-09 DIAGNOSIS — N186 End stage renal disease: Secondary | ICD-10-CM | POA: Diagnosis not present

## 2018-11-09 DIAGNOSIS — F0150 Vascular dementia without behavioral disturbance: Secondary | ICD-10-CM | POA: Diagnosis not present

## 2018-11-09 DIAGNOSIS — D631 Anemia in chronic kidney disease: Secondary | ICD-10-CM | POA: Diagnosis not present

## 2018-11-09 DIAGNOSIS — I132 Hypertensive heart and chronic kidney disease with heart failure and with stage 5 chronic kidney disease, or end stage renal disease: Secondary | ICD-10-CM | POA: Diagnosis not present

## 2018-11-09 DIAGNOSIS — I25709 Atherosclerosis of coronary artery bypass graft(s), unspecified, with unspecified angina pectoris: Secondary | ICD-10-CM | POA: Diagnosis not present

## 2018-11-09 DIAGNOSIS — I5042 Chronic combined systolic (congestive) and diastolic (congestive) heart failure: Secondary | ICD-10-CM | POA: Diagnosis not present

## 2018-11-12 DIAGNOSIS — N186 End stage renal disease: Secondary | ICD-10-CM | POA: Diagnosis not present

## 2018-11-12 DIAGNOSIS — F0150 Vascular dementia without behavioral disturbance: Secondary | ICD-10-CM | POA: Diagnosis not present

## 2018-11-12 DIAGNOSIS — I132 Hypertensive heart and chronic kidney disease with heart failure and with stage 5 chronic kidney disease, or end stage renal disease: Secondary | ICD-10-CM | POA: Diagnosis not present

## 2018-11-12 DIAGNOSIS — I5042 Chronic combined systolic (congestive) and diastolic (congestive) heart failure: Secondary | ICD-10-CM | POA: Diagnosis not present

## 2018-11-12 DIAGNOSIS — I25709 Atherosclerosis of coronary artery bypass graft(s), unspecified, with unspecified angina pectoris: Secondary | ICD-10-CM | POA: Diagnosis not present

## 2018-11-12 DIAGNOSIS — D631 Anemia in chronic kidney disease: Secondary | ICD-10-CM | POA: Diagnosis not present

## 2018-11-13 DIAGNOSIS — I132 Hypertensive heart and chronic kidney disease with heart failure and with stage 5 chronic kidney disease, or end stage renal disease: Secondary | ICD-10-CM | POA: Diagnosis not present

## 2018-11-13 DIAGNOSIS — N186 End stage renal disease: Secondary | ICD-10-CM | POA: Diagnosis not present

## 2018-11-13 DIAGNOSIS — I5042 Chronic combined systolic (congestive) and diastolic (congestive) heart failure: Secondary | ICD-10-CM | POA: Diagnosis not present

## 2018-11-13 DIAGNOSIS — I25709 Atherosclerosis of coronary artery bypass graft(s), unspecified, with unspecified angina pectoris: Secondary | ICD-10-CM | POA: Diagnosis not present

## 2018-11-13 DIAGNOSIS — F0150 Vascular dementia without behavioral disturbance: Secondary | ICD-10-CM | POA: Diagnosis not present

## 2018-11-13 DIAGNOSIS — D631 Anemia in chronic kidney disease: Secondary | ICD-10-CM | POA: Diagnosis not present

## 2018-11-15 DIAGNOSIS — I25709 Atherosclerosis of coronary artery bypass graft(s), unspecified, with unspecified angina pectoris: Secondary | ICD-10-CM | POA: Diagnosis not present

## 2018-11-15 DIAGNOSIS — I132 Hypertensive heart and chronic kidney disease with heart failure and with stage 5 chronic kidney disease, or end stage renal disease: Secondary | ICD-10-CM | POA: Diagnosis not present

## 2018-11-15 DIAGNOSIS — D631 Anemia in chronic kidney disease: Secondary | ICD-10-CM | POA: Diagnosis not present

## 2018-11-15 DIAGNOSIS — F0150 Vascular dementia without behavioral disturbance: Secondary | ICD-10-CM | POA: Diagnosis not present

## 2018-11-15 DIAGNOSIS — N186 End stage renal disease: Secondary | ICD-10-CM | POA: Diagnosis not present

## 2018-11-15 DIAGNOSIS — I5042 Chronic combined systolic (congestive) and diastolic (congestive) heart failure: Secondary | ICD-10-CM | POA: Diagnosis not present

## 2018-11-16 DIAGNOSIS — D631 Anemia in chronic kidney disease: Secondary | ICD-10-CM | POA: Diagnosis not present

## 2018-11-16 DIAGNOSIS — I132 Hypertensive heart and chronic kidney disease with heart failure and with stage 5 chronic kidney disease, or end stage renal disease: Secondary | ICD-10-CM | POA: Diagnosis not present

## 2018-11-16 DIAGNOSIS — F0150 Vascular dementia without behavioral disturbance: Secondary | ICD-10-CM | POA: Diagnosis not present

## 2018-11-16 DIAGNOSIS — I25709 Atherosclerosis of coronary artery bypass graft(s), unspecified, with unspecified angina pectoris: Secondary | ICD-10-CM | POA: Diagnosis not present

## 2018-11-16 DIAGNOSIS — I5042 Chronic combined systolic (congestive) and diastolic (congestive) heart failure: Secondary | ICD-10-CM | POA: Diagnosis not present

## 2018-11-16 DIAGNOSIS — N186 End stage renal disease: Secondary | ICD-10-CM | POA: Diagnosis not present

## 2018-11-18 DIAGNOSIS — D631 Anemia in chronic kidney disease: Secondary | ICD-10-CM | POA: Diagnosis not present

## 2018-11-18 DIAGNOSIS — I5042 Chronic combined systolic (congestive) and diastolic (congestive) heart failure: Secondary | ICD-10-CM | POA: Diagnosis not present

## 2018-11-18 DIAGNOSIS — I132 Hypertensive heart and chronic kidney disease with heart failure and with stage 5 chronic kidney disease, or end stage renal disease: Secondary | ICD-10-CM | POA: Diagnosis not present

## 2018-11-18 DIAGNOSIS — F0150 Vascular dementia without behavioral disturbance: Secondary | ICD-10-CM | POA: Diagnosis not present

## 2018-11-18 DIAGNOSIS — N186 End stage renal disease: Secondary | ICD-10-CM | POA: Diagnosis not present

## 2018-11-18 DIAGNOSIS — I25709 Atherosclerosis of coronary artery bypass graft(s), unspecified, with unspecified angina pectoris: Secondary | ICD-10-CM | POA: Diagnosis not present

## 2018-11-19 DIAGNOSIS — F0150 Vascular dementia without behavioral disturbance: Secondary | ICD-10-CM | POA: Diagnosis not present

## 2018-11-19 DIAGNOSIS — I5042 Chronic combined systolic (congestive) and diastolic (congestive) heart failure: Secondary | ICD-10-CM | POA: Diagnosis not present

## 2018-11-19 DIAGNOSIS — I25709 Atherosclerosis of coronary artery bypass graft(s), unspecified, with unspecified angina pectoris: Secondary | ICD-10-CM | POA: Diagnosis not present

## 2018-11-19 DIAGNOSIS — N186 End stage renal disease: Secondary | ICD-10-CM | POA: Diagnosis not present

## 2018-11-19 DIAGNOSIS — D631 Anemia in chronic kidney disease: Secondary | ICD-10-CM | POA: Diagnosis not present

## 2018-11-19 DIAGNOSIS — I132 Hypertensive heart and chronic kidney disease with heart failure and with stage 5 chronic kidney disease, or end stage renal disease: Secondary | ICD-10-CM | POA: Diagnosis not present

## 2018-11-22 DIAGNOSIS — N186 End stage renal disease: Secondary | ICD-10-CM | POA: Diagnosis not present

## 2018-11-22 DIAGNOSIS — I5042 Chronic combined systolic (congestive) and diastolic (congestive) heart failure: Secondary | ICD-10-CM | POA: Diagnosis not present

## 2018-11-22 DIAGNOSIS — F0150 Vascular dementia without behavioral disturbance: Secondary | ICD-10-CM | POA: Diagnosis not present

## 2018-11-22 DIAGNOSIS — I25709 Atherosclerosis of coronary artery bypass graft(s), unspecified, with unspecified angina pectoris: Secondary | ICD-10-CM | POA: Diagnosis not present

## 2018-11-22 DIAGNOSIS — I132 Hypertensive heart and chronic kidney disease with heart failure and with stage 5 chronic kidney disease, or end stage renal disease: Secondary | ICD-10-CM | POA: Diagnosis not present

## 2018-11-22 DIAGNOSIS — D631 Anemia in chronic kidney disease: Secondary | ICD-10-CM | POA: Diagnosis not present

## 2018-11-26 DIAGNOSIS — F0150 Vascular dementia without behavioral disturbance: Secondary | ICD-10-CM | POA: Diagnosis not present

## 2018-11-26 DIAGNOSIS — I25709 Atherosclerosis of coronary artery bypass graft(s), unspecified, with unspecified angina pectoris: Secondary | ICD-10-CM | POA: Diagnosis not present

## 2018-11-26 DIAGNOSIS — D631 Anemia in chronic kidney disease: Secondary | ICD-10-CM | POA: Diagnosis not present

## 2018-11-26 DIAGNOSIS — N186 End stage renal disease: Secondary | ICD-10-CM | POA: Diagnosis not present

## 2018-11-26 DIAGNOSIS — I5042 Chronic combined systolic (congestive) and diastolic (congestive) heart failure: Secondary | ICD-10-CM | POA: Diagnosis not present

## 2018-11-26 DIAGNOSIS — I132 Hypertensive heart and chronic kidney disease with heart failure and with stage 5 chronic kidney disease, or end stage renal disease: Secondary | ICD-10-CM | POA: Diagnosis not present

## 2018-11-27 DIAGNOSIS — I480 Paroxysmal atrial fibrillation: Secondary | ICD-10-CM | POA: Diagnosis not present

## 2018-11-27 DIAGNOSIS — Z87440 Personal history of urinary (tract) infections: Secondary | ICD-10-CM | POA: Diagnosis not present

## 2018-11-27 DIAGNOSIS — Z741 Need for assistance with personal care: Secondary | ICD-10-CM | POA: Diagnosis not present

## 2018-11-27 DIAGNOSIS — C61 Malignant neoplasm of prostate: Secondary | ICD-10-CM | POA: Diagnosis not present

## 2018-11-27 DIAGNOSIS — M4850XD Collapsed vertebra, not elsewhere classified, site unspecified, subsequent encounter for fracture with routine healing: Secondary | ICD-10-CM | POA: Diagnosis not present

## 2018-11-27 DIAGNOSIS — D631 Anemia in chronic kidney disease: Secondary | ICD-10-CM | POA: Diagnosis not present

## 2018-11-27 DIAGNOSIS — F0150 Vascular dementia without behavioral disturbance: Secondary | ICD-10-CM | POA: Diagnosis not present

## 2018-11-27 DIAGNOSIS — M549 Dorsalgia, unspecified: Secondary | ICD-10-CM | POA: Diagnosis not present

## 2018-11-27 DIAGNOSIS — K219 Gastro-esophageal reflux disease without esophagitis: Secondary | ICD-10-CM | POA: Diagnosis not present

## 2018-11-27 DIAGNOSIS — M1990 Unspecified osteoarthritis, unspecified site: Secondary | ICD-10-CM | POA: Diagnosis not present

## 2018-11-27 DIAGNOSIS — N1330 Unspecified hydronephrosis: Secondary | ICD-10-CM | POA: Diagnosis not present

## 2018-11-27 DIAGNOSIS — I25709 Atherosclerosis of coronary artery bypass graft(s), unspecified, with unspecified angina pectoris: Secondary | ICD-10-CM | POA: Diagnosis not present

## 2018-11-27 DIAGNOSIS — Z960 Presence of urogenital implants: Secondary | ICD-10-CM | POA: Diagnosis not present

## 2018-11-27 DIAGNOSIS — I5042 Chronic combined systolic (congestive) and diastolic (congestive) heart failure: Secondary | ICD-10-CM | POA: Diagnosis not present

## 2018-11-27 DIAGNOSIS — I252 Old myocardial infarction: Secondary | ICD-10-CM | POA: Diagnosis not present

## 2018-11-27 DIAGNOSIS — N186 End stage renal disease: Secondary | ICD-10-CM | POA: Diagnosis not present

## 2018-11-27 DIAGNOSIS — I132 Hypertensive heart and chronic kidney disease with heart failure and with stage 5 chronic kidney disease, or end stage renal disease: Secondary | ICD-10-CM | POA: Diagnosis not present

## 2018-11-27 DIAGNOSIS — Z6823 Body mass index (BMI) 23.0-23.9, adult: Secondary | ICD-10-CM | POA: Diagnosis not present

## 2018-11-27 DIAGNOSIS — I255 Ischemic cardiomyopathy: Secondary | ICD-10-CM | POA: Diagnosis not present

## 2018-11-29 DIAGNOSIS — I25709 Atherosclerosis of coronary artery bypass graft(s), unspecified, with unspecified angina pectoris: Secondary | ICD-10-CM | POA: Diagnosis not present

## 2018-11-29 DIAGNOSIS — F0150 Vascular dementia without behavioral disturbance: Secondary | ICD-10-CM | POA: Diagnosis not present

## 2018-11-29 DIAGNOSIS — I5042 Chronic combined systolic (congestive) and diastolic (congestive) heart failure: Secondary | ICD-10-CM | POA: Diagnosis not present

## 2018-11-29 DIAGNOSIS — D631 Anemia in chronic kidney disease: Secondary | ICD-10-CM | POA: Diagnosis not present

## 2018-11-29 DIAGNOSIS — N186 End stage renal disease: Secondary | ICD-10-CM | POA: Diagnosis not present

## 2018-11-29 DIAGNOSIS — I132 Hypertensive heart and chronic kidney disease with heart failure and with stage 5 chronic kidney disease, or end stage renal disease: Secondary | ICD-10-CM | POA: Diagnosis not present

## 2018-12-01 ENCOUNTER — Other Ambulatory Visit: Payer: Self-pay | Admitting: Internal Medicine

## 2018-12-01 DIAGNOSIS — I25709 Atherosclerosis of coronary artery bypass graft(s), unspecified, with unspecified angina pectoris: Secondary | ICD-10-CM | POA: Diagnosis not present

## 2018-12-01 DIAGNOSIS — F0150 Vascular dementia without behavioral disturbance: Secondary | ICD-10-CM | POA: Diagnosis not present

## 2018-12-01 DIAGNOSIS — N186 End stage renal disease: Secondary | ICD-10-CM | POA: Diagnosis not present

## 2018-12-01 DIAGNOSIS — I5042 Chronic combined systolic (congestive) and diastolic (congestive) heart failure: Secondary | ICD-10-CM | POA: Diagnosis not present

## 2018-12-01 DIAGNOSIS — I132 Hypertensive heart and chronic kidney disease with heart failure and with stage 5 chronic kidney disease, or end stage renal disease: Secondary | ICD-10-CM | POA: Diagnosis not present

## 2018-12-01 DIAGNOSIS — D631 Anemia in chronic kidney disease: Secondary | ICD-10-CM | POA: Diagnosis not present

## 2018-12-01 DIAGNOSIS — I5022 Chronic systolic (congestive) heart failure: Secondary | ICD-10-CM

## 2018-12-01 DIAGNOSIS — I214 Non-ST elevation (NSTEMI) myocardial infarction: Secondary | ICD-10-CM

## 2018-12-04 DIAGNOSIS — F0150 Vascular dementia without behavioral disturbance: Secondary | ICD-10-CM | POA: Diagnosis not present

## 2018-12-04 DIAGNOSIS — I5042 Chronic combined systolic (congestive) and diastolic (congestive) heart failure: Secondary | ICD-10-CM | POA: Diagnosis not present

## 2018-12-04 DIAGNOSIS — D631 Anemia in chronic kidney disease: Secondary | ICD-10-CM | POA: Diagnosis not present

## 2018-12-04 DIAGNOSIS — N186 End stage renal disease: Secondary | ICD-10-CM | POA: Diagnosis not present

## 2018-12-04 DIAGNOSIS — I132 Hypertensive heart and chronic kidney disease with heart failure and with stage 5 chronic kidney disease, or end stage renal disease: Secondary | ICD-10-CM | POA: Diagnosis not present

## 2018-12-04 DIAGNOSIS — I25709 Atherosclerosis of coronary artery bypass graft(s), unspecified, with unspecified angina pectoris: Secondary | ICD-10-CM | POA: Diagnosis not present

## 2018-12-05 DIAGNOSIS — I5042 Chronic combined systolic (congestive) and diastolic (congestive) heart failure: Secondary | ICD-10-CM | POA: Diagnosis not present

## 2018-12-05 DIAGNOSIS — I25709 Atherosclerosis of coronary artery bypass graft(s), unspecified, with unspecified angina pectoris: Secondary | ICD-10-CM | POA: Diagnosis not present

## 2018-12-05 DIAGNOSIS — D631 Anemia in chronic kidney disease: Secondary | ICD-10-CM | POA: Diagnosis not present

## 2018-12-05 DIAGNOSIS — N186 End stage renal disease: Secondary | ICD-10-CM | POA: Diagnosis not present

## 2018-12-05 DIAGNOSIS — I132 Hypertensive heart and chronic kidney disease with heart failure and with stage 5 chronic kidney disease, or end stage renal disease: Secondary | ICD-10-CM | POA: Diagnosis not present

## 2018-12-05 DIAGNOSIS — F0150 Vascular dementia without behavioral disturbance: Secondary | ICD-10-CM | POA: Diagnosis not present

## 2018-12-06 DIAGNOSIS — D631 Anemia in chronic kidney disease: Secondary | ICD-10-CM | POA: Diagnosis not present

## 2018-12-06 DIAGNOSIS — I25709 Atherosclerosis of coronary artery bypass graft(s), unspecified, with unspecified angina pectoris: Secondary | ICD-10-CM | POA: Diagnosis not present

## 2018-12-06 DIAGNOSIS — N186 End stage renal disease: Secondary | ICD-10-CM | POA: Diagnosis not present

## 2018-12-06 DIAGNOSIS — I5042 Chronic combined systolic (congestive) and diastolic (congestive) heart failure: Secondary | ICD-10-CM | POA: Diagnosis not present

## 2018-12-06 DIAGNOSIS — I132 Hypertensive heart and chronic kidney disease with heart failure and with stage 5 chronic kidney disease, or end stage renal disease: Secondary | ICD-10-CM | POA: Diagnosis not present

## 2018-12-06 DIAGNOSIS — F0150 Vascular dementia without behavioral disturbance: Secondary | ICD-10-CM | POA: Diagnosis not present

## 2018-12-10 DIAGNOSIS — I132 Hypertensive heart and chronic kidney disease with heart failure and with stage 5 chronic kidney disease, or end stage renal disease: Secondary | ICD-10-CM | POA: Diagnosis not present

## 2018-12-10 DIAGNOSIS — I25709 Atherosclerosis of coronary artery bypass graft(s), unspecified, with unspecified angina pectoris: Secondary | ICD-10-CM | POA: Diagnosis not present

## 2018-12-10 DIAGNOSIS — D631 Anemia in chronic kidney disease: Secondary | ICD-10-CM | POA: Diagnosis not present

## 2018-12-10 DIAGNOSIS — I5042 Chronic combined systolic (congestive) and diastolic (congestive) heart failure: Secondary | ICD-10-CM | POA: Diagnosis not present

## 2018-12-10 DIAGNOSIS — N186 End stage renal disease: Secondary | ICD-10-CM | POA: Diagnosis not present

## 2018-12-10 DIAGNOSIS — F0150 Vascular dementia without behavioral disturbance: Secondary | ICD-10-CM | POA: Diagnosis not present

## 2018-12-11 DIAGNOSIS — I132 Hypertensive heart and chronic kidney disease with heart failure and with stage 5 chronic kidney disease, or end stage renal disease: Secondary | ICD-10-CM | POA: Diagnosis not present

## 2018-12-11 DIAGNOSIS — I25709 Atherosclerosis of coronary artery bypass graft(s), unspecified, with unspecified angina pectoris: Secondary | ICD-10-CM | POA: Diagnosis not present

## 2018-12-11 DIAGNOSIS — I5042 Chronic combined systolic (congestive) and diastolic (congestive) heart failure: Secondary | ICD-10-CM | POA: Diagnosis not present

## 2018-12-11 DIAGNOSIS — F0150 Vascular dementia without behavioral disturbance: Secondary | ICD-10-CM | POA: Diagnosis not present

## 2018-12-11 DIAGNOSIS — N186 End stage renal disease: Secondary | ICD-10-CM | POA: Diagnosis not present

## 2018-12-11 DIAGNOSIS — D631 Anemia in chronic kidney disease: Secondary | ICD-10-CM | POA: Diagnosis not present

## 2018-12-13 DIAGNOSIS — D631 Anemia in chronic kidney disease: Secondary | ICD-10-CM | POA: Diagnosis not present

## 2018-12-13 DIAGNOSIS — I5042 Chronic combined systolic (congestive) and diastolic (congestive) heart failure: Secondary | ICD-10-CM | POA: Diagnosis not present

## 2018-12-13 DIAGNOSIS — I132 Hypertensive heart and chronic kidney disease with heart failure and with stage 5 chronic kidney disease, or end stage renal disease: Secondary | ICD-10-CM | POA: Diagnosis not present

## 2018-12-13 DIAGNOSIS — F0150 Vascular dementia without behavioral disturbance: Secondary | ICD-10-CM | POA: Diagnosis not present

## 2018-12-13 DIAGNOSIS — I25709 Atherosclerosis of coronary artery bypass graft(s), unspecified, with unspecified angina pectoris: Secondary | ICD-10-CM | POA: Diagnosis not present

## 2018-12-13 DIAGNOSIS — N186 End stage renal disease: Secondary | ICD-10-CM | POA: Diagnosis not present

## 2018-12-14 DIAGNOSIS — I132 Hypertensive heart and chronic kidney disease with heart failure and with stage 5 chronic kidney disease, or end stage renal disease: Secondary | ICD-10-CM | POA: Diagnosis not present

## 2018-12-14 DIAGNOSIS — I25709 Atherosclerosis of coronary artery bypass graft(s), unspecified, with unspecified angina pectoris: Secondary | ICD-10-CM | POA: Diagnosis not present

## 2018-12-14 DIAGNOSIS — D631 Anemia in chronic kidney disease: Secondary | ICD-10-CM | POA: Diagnosis not present

## 2018-12-14 DIAGNOSIS — N186 End stage renal disease: Secondary | ICD-10-CM | POA: Diagnosis not present

## 2018-12-14 DIAGNOSIS — I5042 Chronic combined systolic (congestive) and diastolic (congestive) heart failure: Secondary | ICD-10-CM | POA: Diagnosis not present

## 2018-12-14 DIAGNOSIS — F0150 Vascular dementia without behavioral disturbance: Secondary | ICD-10-CM | POA: Diagnosis not present

## 2018-12-17 DIAGNOSIS — D631 Anemia in chronic kidney disease: Secondary | ICD-10-CM | POA: Diagnosis not present

## 2018-12-17 DIAGNOSIS — N186 End stage renal disease: Secondary | ICD-10-CM | POA: Diagnosis not present

## 2018-12-17 DIAGNOSIS — I132 Hypertensive heart and chronic kidney disease with heart failure and with stage 5 chronic kidney disease, or end stage renal disease: Secondary | ICD-10-CM | POA: Diagnosis not present

## 2018-12-17 DIAGNOSIS — F0150 Vascular dementia without behavioral disturbance: Secondary | ICD-10-CM | POA: Diagnosis not present

## 2018-12-17 DIAGNOSIS — I25709 Atherosclerosis of coronary artery bypass graft(s), unspecified, with unspecified angina pectoris: Secondary | ICD-10-CM | POA: Diagnosis not present

## 2018-12-17 DIAGNOSIS — I5042 Chronic combined systolic (congestive) and diastolic (congestive) heart failure: Secondary | ICD-10-CM | POA: Diagnosis not present

## 2018-12-20 DIAGNOSIS — F0150 Vascular dementia without behavioral disturbance: Secondary | ICD-10-CM | POA: Diagnosis not present

## 2018-12-20 DIAGNOSIS — D631 Anemia in chronic kidney disease: Secondary | ICD-10-CM | POA: Diagnosis not present

## 2018-12-20 DIAGNOSIS — I25709 Atherosclerosis of coronary artery bypass graft(s), unspecified, with unspecified angina pectoris: Secondary | ICD-10-CM | POA: Diagnosis not present

## 2018-12-20 DIAGNOSIS — I5042 Chronic combined systolic (congestive) and diastolic (congestive) heart failure: Secondary | ICD-10-CM | POA: Diagnosis not present

## 2018-12-20 DIAGNOSIS — I132 Hypertensive heart and chronic kidney disease with heart failure and with stage 5 chronic kidney disease, or end stage renal disease: Secondary | ICD-10-CM | POA: Diagnosis not present

## 2018-12-20 DIAGNOSIS — N186 End stage renal disease: Secondary | ICD-10-CM | POA: Diagnosis not present

## 2018-12-21 DIAGNOSIS — I132 Hypertensive heart and chronic kidney disease with heart failure and with stage 5 chronic kidney disease, or end stage renal disease: Secondary | ICD-10-CM | POA: Diagnosis not present

## 2018-12-21 DIAGNOSIS — I5042 Chronic combined systolic (congestive) and diastolic (congestive) heart failure: Secondary | ICD-10-CM | POA: Diagnosis not present

## 2018-12-21 DIAGNOSIS — I25709 Atherosclerosis of coronary artery bypass graft(s), unspecified, with unspecified angina pectoris: Secondary | ICD-10-CM | POA: Diagnosis not present

## 2018-12-21 DIAGNOSIS — N186 End stage renal disease: Secondary | ICD-10-CM | POA: Diagnosis not present

## 2018-12-21 DIAGNOSIS — F0150 Vascular dementia without behavioral disturbance: Secondary | ICD-10-CM | POA: Diagnosis not present

## 2018-12-21 DIAGNOSIS — D631 Anemia in chronic kidney disease: Secondary | ICD-10-CM | POA: Diagnosis not present

## 2018-12-24 DIAGNOSIS — I132 Hypertensive heart and chronic kidney disease with heart failure and with stage 5 chronic kidney disease, or end stage renal disease: Secondary | ICD-10-CM | POA: Diagnosis not present

## 2018-12-24 DIAGNOSIS — I5042 Chronic combined systolic (congestive) and diastolic (congestive) heart failure: Secondary | ICD-10-CM | POA: Diagnosis not present

## 2018-12-24 DIAGNOSIS — F0150 Vascular dementia without behavioral disturbance: Secondary | ICD-10-CM | POA: Diagnosis not present

## 2018-12-24 DIAGNOSIS — I25709 Atherosclerosis of coronary artery bypass graft(s), unspecified, with unspecified angina pectoris: Secondary | ICD-10-CM | POA: Diagnosis not present

## 2018-12-24 DIAGNOSIS — D631 Anemia in chronic kidney disease: Secondary | ICD-10-CM | POA: Diagnosis not present

## 2018-12-24 DIAGNOSIS — N186 End stage renal disease: Secondary | ICD-10-CM | POA: Diagnosis not present

## 2018-12-27 DIAGNOSIS — Z741 Need for assistance with personal care: Secondary | ICD-10-CM | POA: Diagnosis not present

## 2018-12-27 DIAGNOSIS — Z87440 Personal history of urinary (tract) infections: Secondary | ICD-10-CM | POA: Diagnosis not present

## 2018-12-27 DIAGNOSIS — M1990 Unspecified osteoarthritis, unspecified site: Secondary | ICD-10-CM | POA: Diagnosis not present

## 2018-12-27 DIAGNOSIS — I252 Old myocardial infarction: Secondary | ICD-10-CM | POA: Diagnosis not present

## 2018-12-27 DIAGNOSIS — M549 Dorsalgia, unspecified: Secondary | ICD-10-CM | POA: Diagnosis not present

## 2018-12-27 DIAGNOSIS — I255 Ischemic cardiomyopathy: Secondary | ICD-10-CM | POA: Diagnosis not present

## 2018-12-27 DIAGNOSIS — I132 Hypertensive heart and chronic kidney disease with heart failure and with stage 5 chronic kidney disease, or end stage renal disease: Secondary | ICD-10-CM | POA: Diagnosis not present

## 2018-12-27 DIAGNOSIS — N186 End stage renal disease: Secondary | ICD-10-CM | POA: Diagnosis not present

## 2018-12-27 DIAGNOSIS — N1330 Unspecified hydronephrosis: Secondary | ICD-10-CM | POA: Diagnosis not present

## 2018-12-27 DIAGNOSIS — I480 Paroxysmal atrial fibrillation: Secondary | ICD-10-CM | POA: Diagnosis not present

## 2018-12-27 DIAGNOSIS — Z6823 Body mass index (BMI) 23.0-23.9, adult: Secondary | ICD-10-CM | POA: Diagnosis not present

## 2018-12-27 DIAGNOSIS — C61 Malignant neoplasm of prostate: Secondary | ICD-10-CM | POA: Diagnosis not present

## 2018-12-27 DIAGNOSIS — K219 Gastro-esophageal reflux disease without esophagitis: Secondary | ICD-10-CM | POA: Diagnosis not present

## 2018-12-27 DIAGNOSIS — I25709 Atherosclerosis of coronary artery bypass graft(s), unspecified, with unspecified angina pectoris: Secondary | ICD-10-CM | POA: Diagnosis not present

## 2018-12-27 DIAGNOSIS — M4850XD Collapsed vertebra, not elsewhere classified, site unspecified, subsequent encounter for fracture with routine healing: Secondary | ICD-10-CM | POA: Diagnosis not present

## 2018-12-27 DIAGNOSIS — D631 Anemia in chronic kidney disease: Secondary | ICD-10-CM | POA: Diagnosis not present

## 2018-12-27 DIAGNOSIS — I5042 Chronic combined systolic (congestive) and diastolic (congestive) heart failure: Secondary | ICD-10-CM | POA: Diagnosis not present

## 2018-12-27 DIAGNOSIS — Z960 Presence of urogenital implants: Secondary | ICD-10-CM | POA: Diagnosis not present

## 2018-12-27 DIAGNOSIS — F0150 Vascular dementia without behavioral disturbance: Secondary | ICD-10-CM | POA: Diagnosis not present

## 2018-12-28 DIAGNOSIS — I132 Hypertensive heart and chronic kidney disease with heart failure and with stage 5 chronic kidney disease, or end stage renal disease: Secondary | ICD-10-CM | POA: Diagnosis not present

## 2018-12-28 DIAGNOSIS — F0150 Vascular dementia without behavioral disturbance: Secondary | ICD-10-CM | POA: Diagnosis not present

## 2018-12-28 DIAGNOSIS — N186 End stage renal disease: Secondary | ICD-10-CM | POA: Diagnosis not present

## 2018-12-28 DIAGNOSIS — I25709 Atherosclerosis of coronary artery bypass graft(s), unspecified, with unspecified angina pectoris: Secondary | ICD-10-CM | POA: Diagnosis not present

## 2018-12-28 DIAGNOSIS — I5042 Chronic combined systolic (congestive) and diastolic (congestive) heart failure: Secondary | ICD-10-CM | POA: Diagnosis not present

## 2018-12-28 DIAGNOSIS — D631 Anemia in chronic kidney disease: Secondary | ICD-10-CM | POA: Diagnosis not present

## 2018-12-30 DIAGNOSIS — N186 End stage renal disease: Secondary | ICD-10-CM | POA: Diagnosis not present

## 2018-12-30 DIAGNOSIS — D631 Anemia in chronic kidney disease: Secondary | ICD-10-CM | POA: Diagnosis not present

## 2018-12-30 DIAGNOSIS — I5042 Chronic combined systolic (congestive) and diastolic (congestive) heart failure: Secondary | ICD-10-CM | POA: Diagnosis not present

## 2018-12-30 DIAGNOSIS — I132 Hypertensive heart and chronic kidney disease with heart failure and with stage 5 chronic kidney disease, or end stage renal disease: Secondary | ICD-10-CM | POA: Diagnosis not present

## 2018-12-30 DIAGNOSIS — F0150 Vascular dementia without behavioral disturbance: Secondary | ICD-10-CM | POA: Diagnosis not present

## 2018-12-30 DIAGNOSIS — I25709 Atherosclerosis of coronary artery bypass graft(s), unspecified, with unspecified angina pectoris: Secondary | ICD-10-CM | POA: Diagnosis not present

## 2018-12-31 DIAGNOSIS — D631 Anemia in chronic kidney disease: Secondary | ICD-10-CM | POA: Diagnosis not present

## 2018-12-31 DIAGNOSIS — I5042 Chronic combined systolic (congestive) and diastolic (congestive) heart failure: Secondary | ICD-10-CM | POA: Diagnosis not present

## 2018-12-31 DIAGNOSIS — I132 Hypertensive heart and chronic kidney disease with heart failure and with stage 5 chronic kidney disease, or end stage renal disease: Secondary | ICD-10-CM | POA: Diagnosis not present

## 2018-12-31 DIAGNOSIS — F0150 Vascular dementia without behavioral disturbance: Secondary | ICD-10-CM | POA: Diagnosis not present

## 2018-12-31 DIAGNOSIS — I25709 Atherosclerosis of coronary artery bypass graft(s), unspecified, with unspecified angina pectoris: Secondary | ICD-10-CM | POA: Diagnosis not present

## 2018-12-31 DIAGNOSIS — N186 End stage renal disease: Secondary | ICD-10-CM | POA: Diagnosis not present

## 2019-01-02 DIAGNOSIS — D631 Anemia in chronic kidney disease: Secondary | ICD-10-CM | POA: Diagnosis not present

## 2019-01-02 DIAGNOSIS — F0150 Vascular dementia without behavioral disturbance: Secondary | ICD-10-CM | POA: Diagnosis not present

## 2019-01-02 DIAGNOSIS — I132 Hypertensive heart and chronic kidney disease with heart failure and with stage 5 chronic kidney disease, or end stage renal disease: Secondary | ICD-10-CM | POA: Diagnosis not present

## 2019-01-02 DIAGNOSIS — I5042 Chronic combined systolic (congestive) and diastolic (congestive) heart failure: Secondary | ICD-10-CM | POA: Diagnosis not present

## 2019-01-02 DIAGNOSIS — N186 End stage renal disease: Secondary | ICD-10-CM | POA: Diagnosis not present

## 2019-01-02 DIAGNOSIS — I25709 Atherosclerosis of coronary artery bypass graft(s), unspecified, with unspecified angina pectoris: Secondary | ICD-10-CM | POA: Diagnosis not present

## 2019-01-03 ENCOUNTER — Telehealth: Payer: Self-pay | Admitting: Internal Medicine

## 2019-01-03 DIAGNOSIS — N186 End stage renal disease: Secondary | ICD-10-CM | POA: Diagnosis not present

## 2019-01-03 DIAGNOSIS — I132 Hypertensive heart and chronic kidney disease with heart failure and with stage 5 chronic kidney disease, or end stage renal disease: Secondary | ICD-10-CM | POA: Diagnosis not present

## 2019-01-03 DIAGNOSIS — I5042 Chronic combined systolic (congestive) and diastolic (congestive) heart failure: Secondary | ICD-10-CM | POA: Diagnosis not present

## 2019-01-03 DIAGNOSIS — F0150 Vascular dementia without behavioral disturbance: Secondary | ICD-10-CM | POA: Diagnosis not present

## 2019-01-03 DIAGNOSIS — D631 Anemia in chronic kidney disease: Secondary | ICD-10-CM | POA: Diagnosis not present

## 2019-01-03 DIAGNOSIS — I25709 Atherosclerosis of coronary artery bypass graft(s), unspecified, with unspecified angina pectoris: Secondary | ICD-10-CM | POA: Diagnosis not present

## 2019-01-27 NOTE — Telephone Encounter (Signed)
Ronald [name] with Ronald [name] called and wanted to let Dr. Jerilee Hoh know that patient pass away today. If any question she can be reached at [phone].

## 2019-01-27 DEATH — deceased

## 2019-01-28 ENCOUNTER — Telehealth: Payer: Self-pay | Admitting: Internal Medicine

## 2019-01-28 NOTE — Telephone Encounter (Signed)
Ronald [name] with [name] called and said that she has not yet received the order send to the office for Dr. Jerilee Hoh. The order is sign and under patient's media date 12/25/18. She would like the order re faxed to them please. Her fax number is [phone].

## 2019-01-29 NOTE — Telephone Encounter (Signed)
Best call back: [phone] Ronald [name] is requesting call back. Please advise

## 2019-01-30 NOTE — Telephone Encounter (Signed)
Faxed and confirmed

## 2020-07-17 IMAGING — CR CHEST - 2 VIEW
2 series · 2 of 2 positions shown · non-contrast
Comparison: None.

CLINICAL DATA: Cough

EXAM:
CHEST - 2 VIEW

[w chest lat]
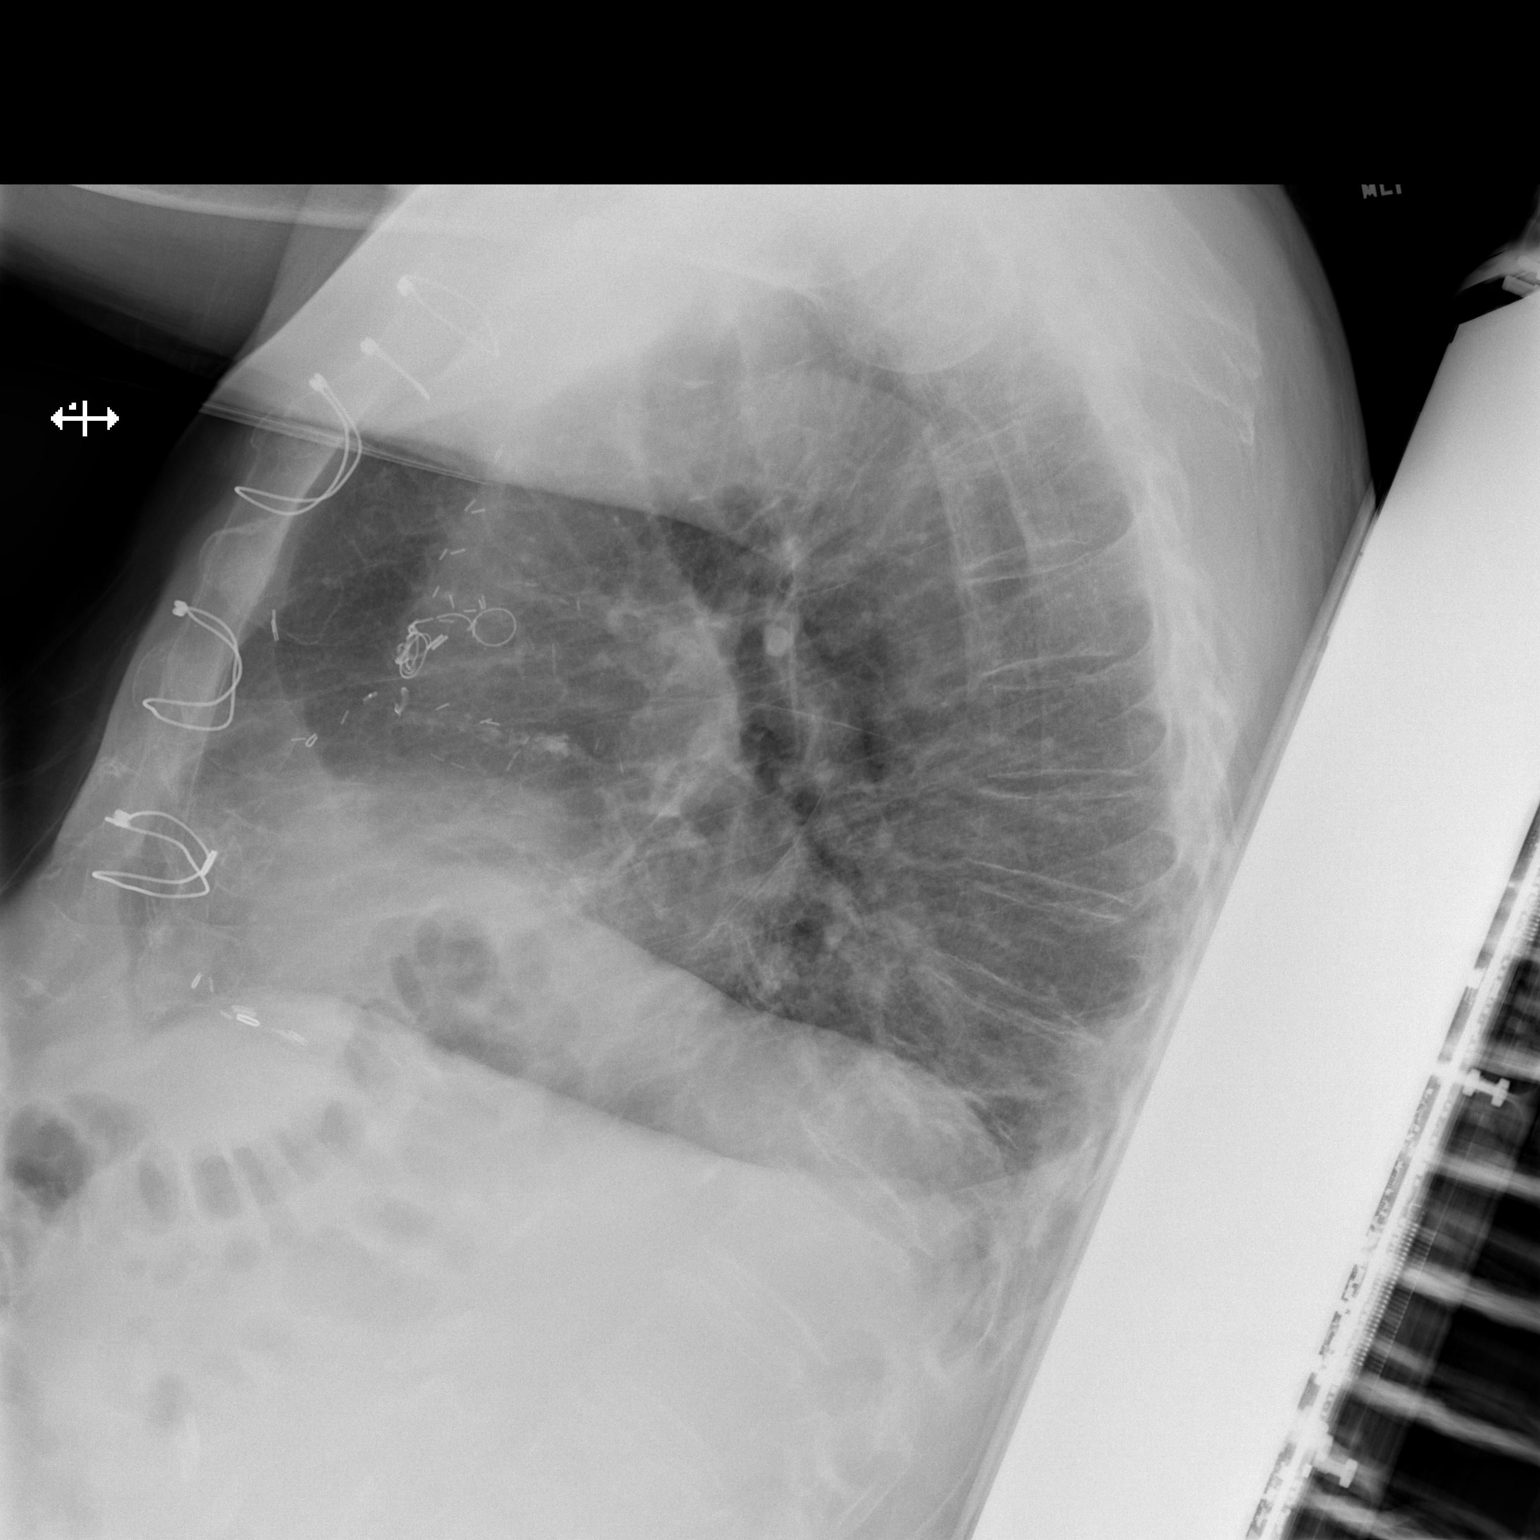

[x chest ap]
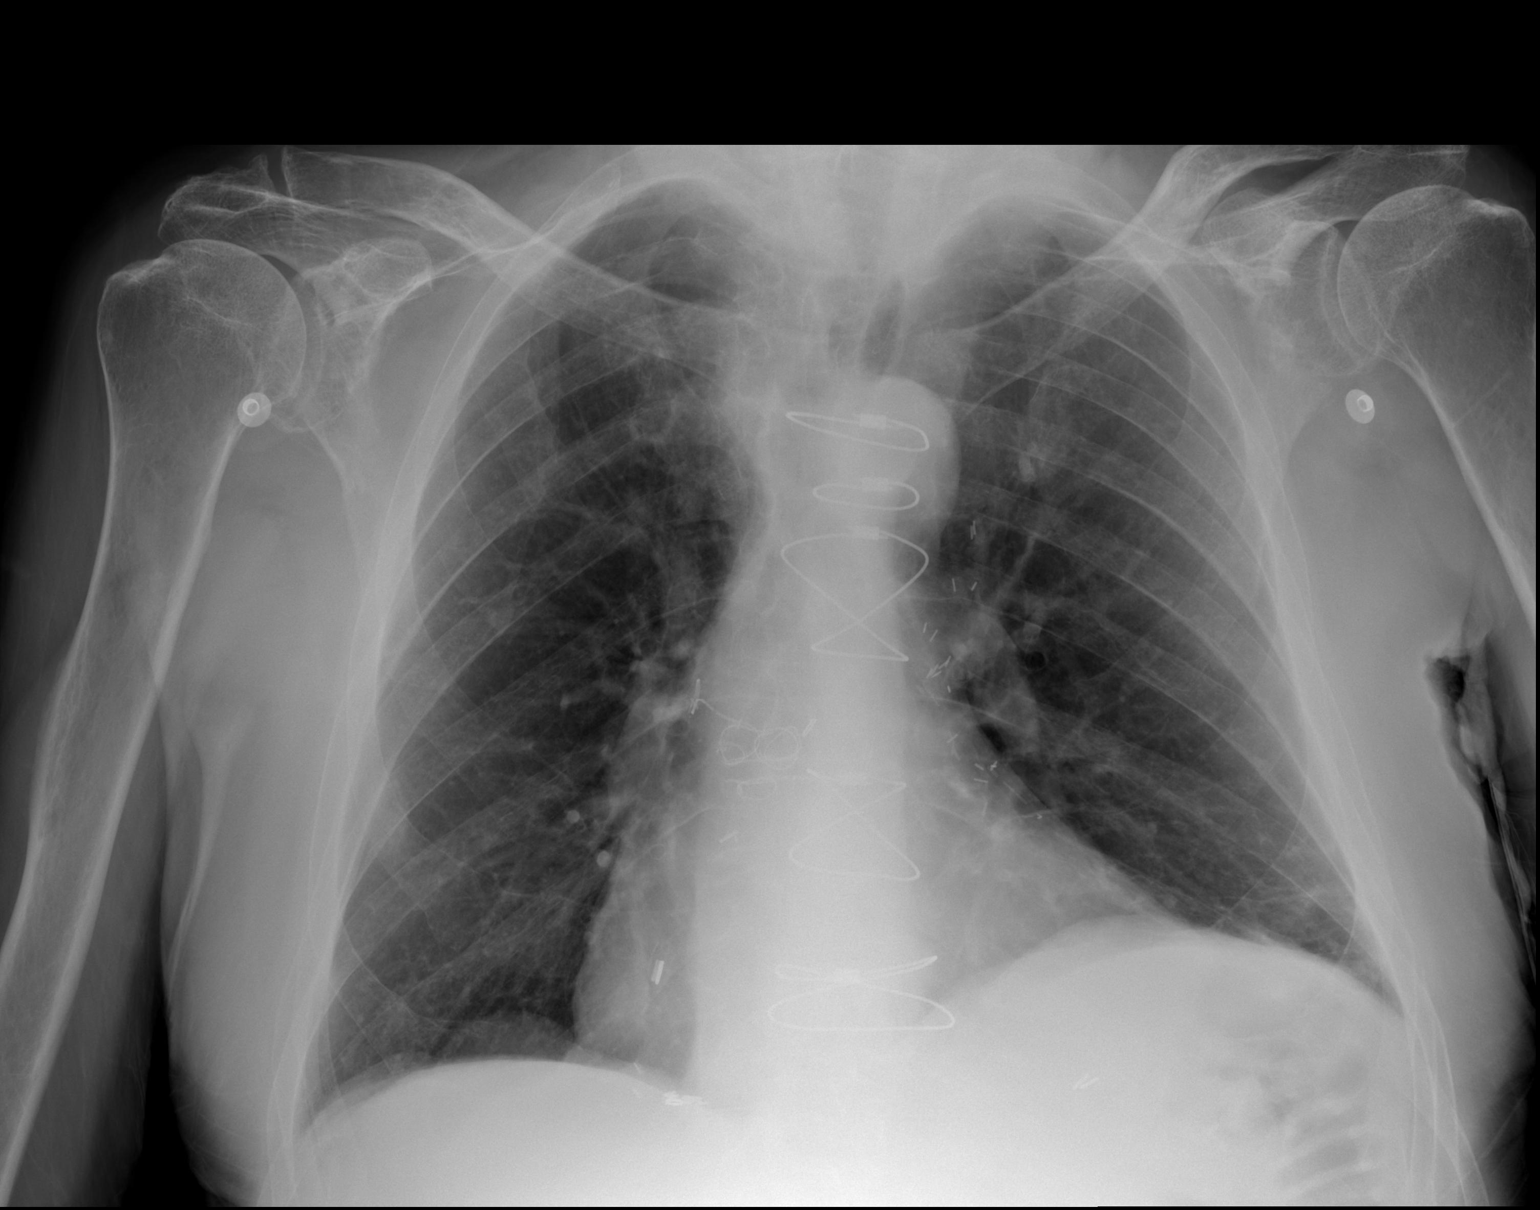

[2 of 2 positions shown; findings below may reference images not displayed]

FINDINGS: The heart size and mediastinal contours are within normal limits.
Both lungs are clear. The visualized skeletal structures are
unremarkable. Remote median sternotomy
IMPRESSION: No active cardiopulmonary disease.

## 2020-10-20 IMAGING — US US RENAL
1 series · 14 of 25 positions shown · non-contrast
Comparison: CT from 09/15/2018

CLINICAL DATA: Acute renal failure

EXAM:
RENAL / URINARY TRACT ULTRASOUND COMPLETE

[Series 1: us renal · 14 of 37 slices shown]
[im 1/37]
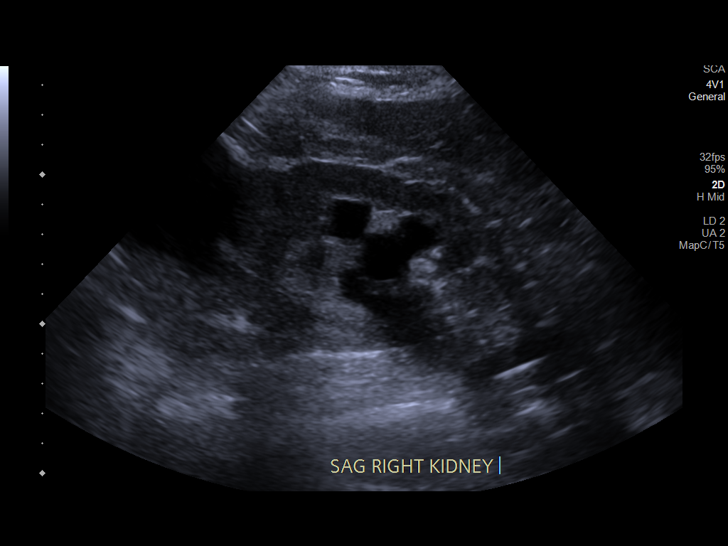
[im 4/37]
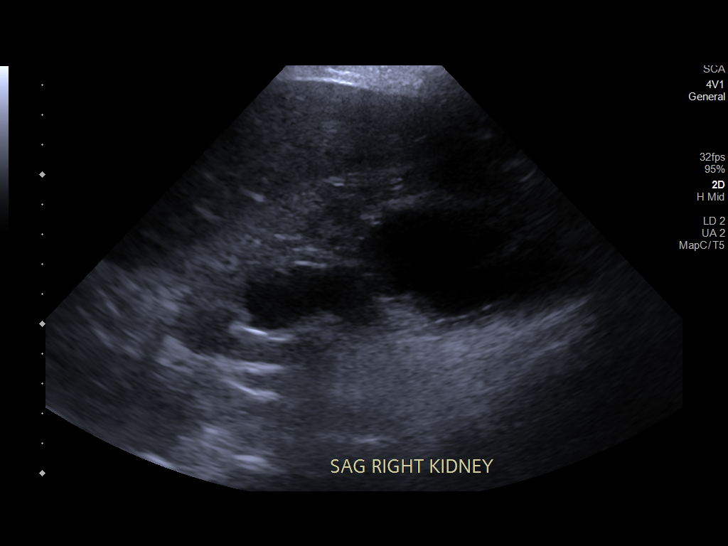
[im 7/37]
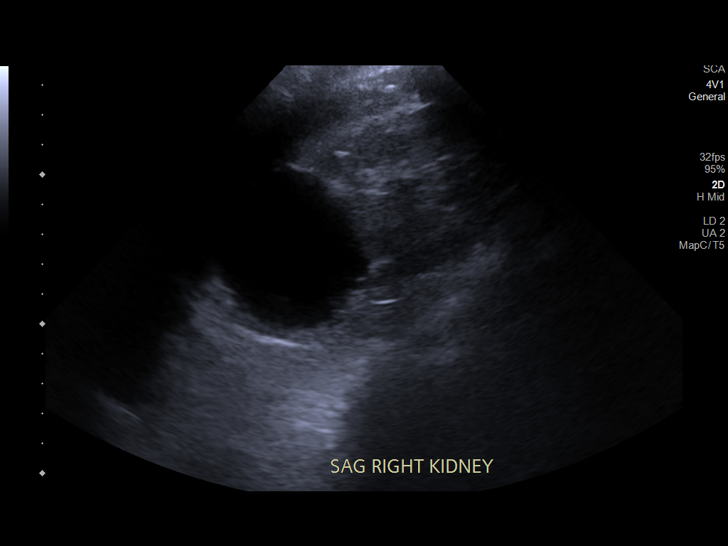
[im 10/37]
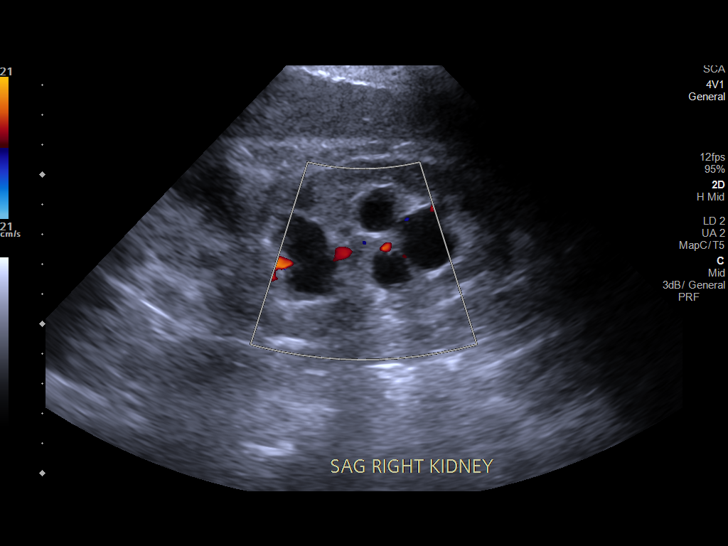
[im 13/37]
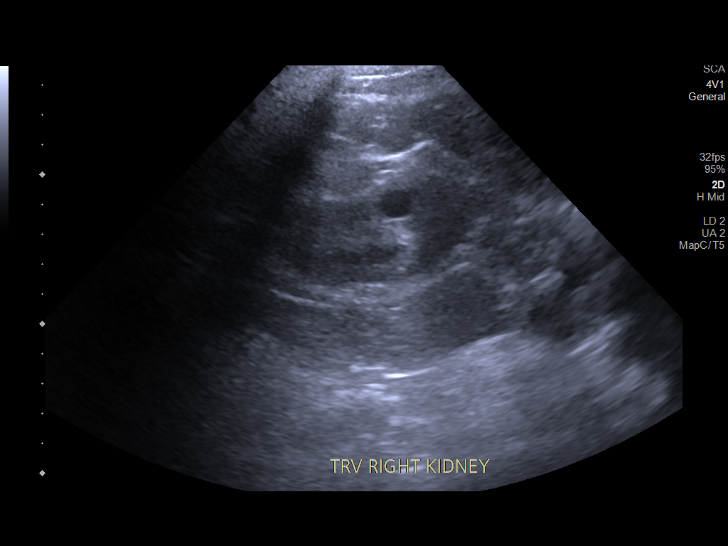
[im 14/37]
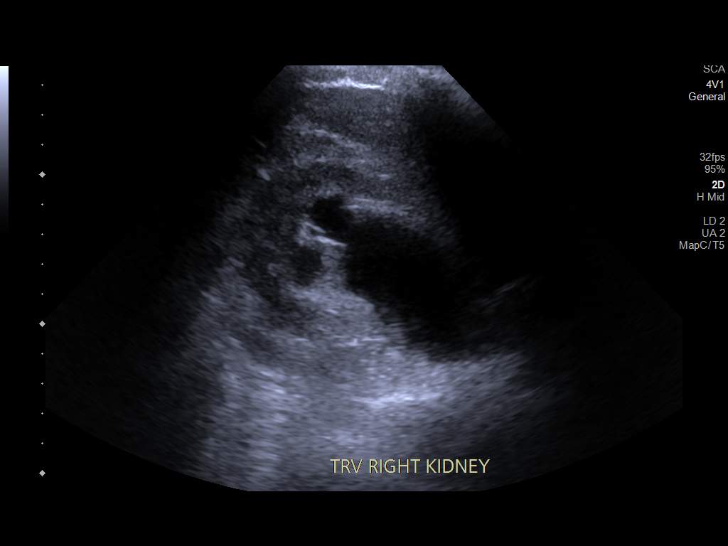
[im 17/37]
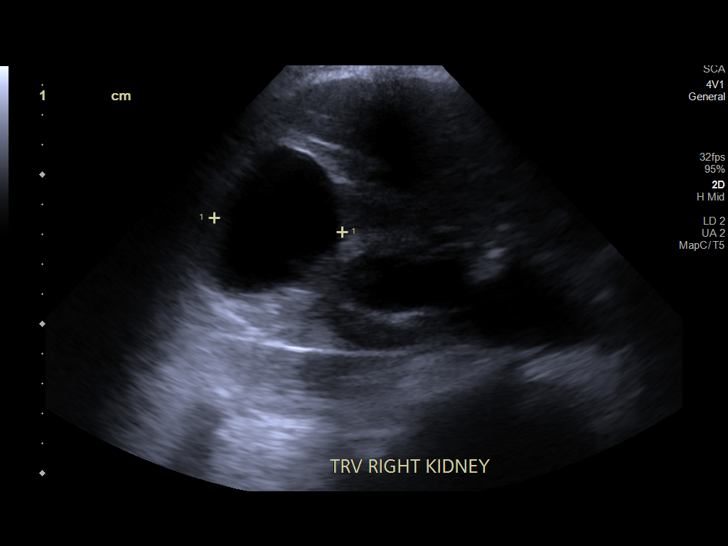
[im 20/37]
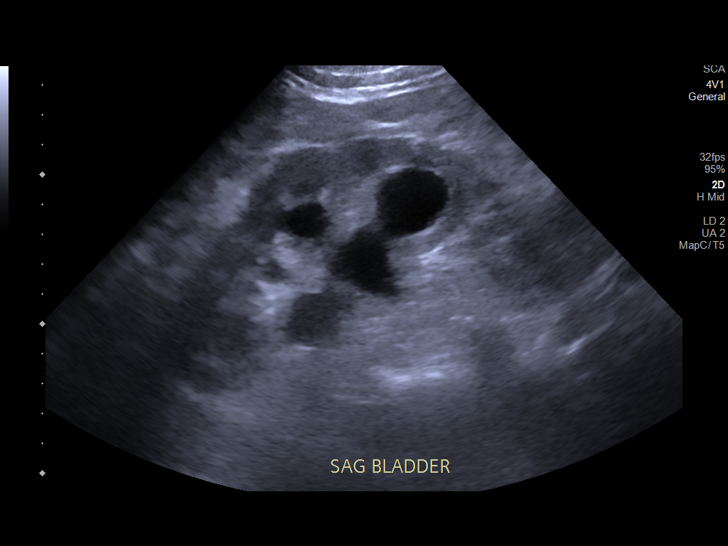
[im 23/37]
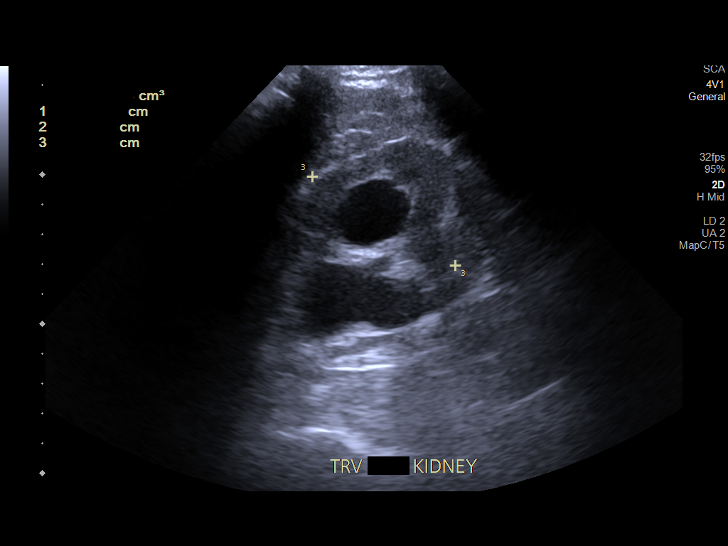
[im 25/37]
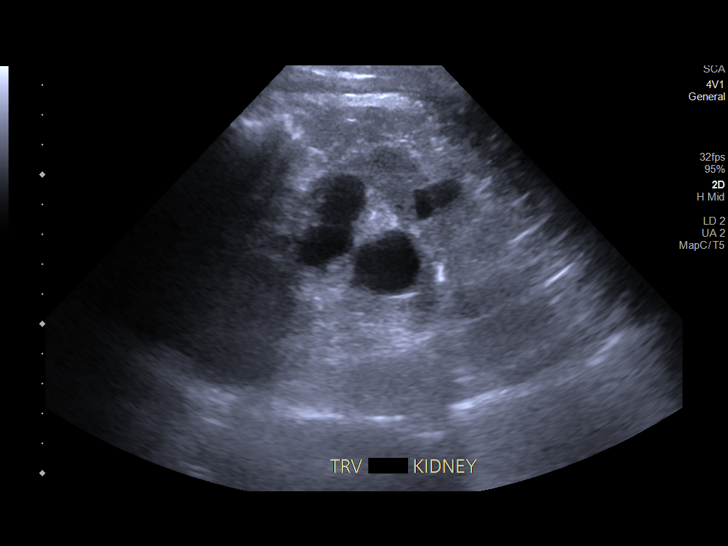
[im 28/37]
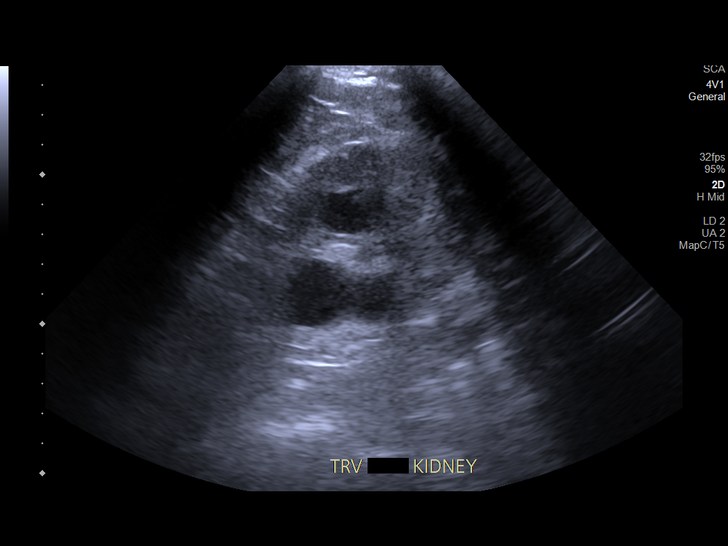
[im 31/37]
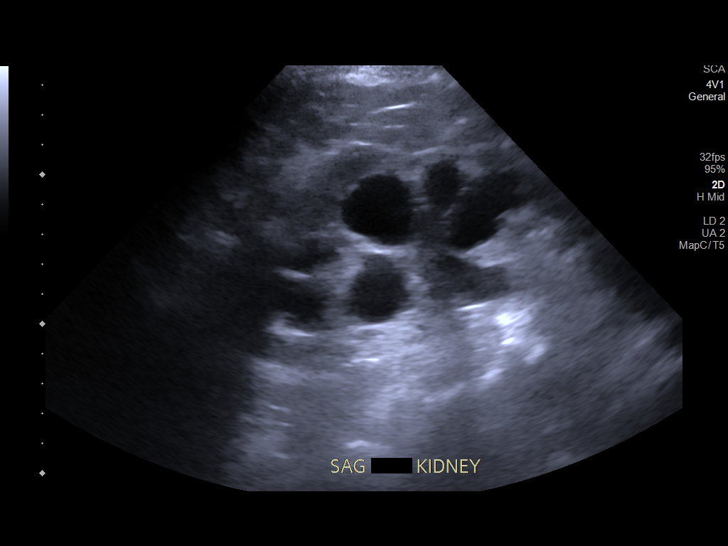
[im 34/37]
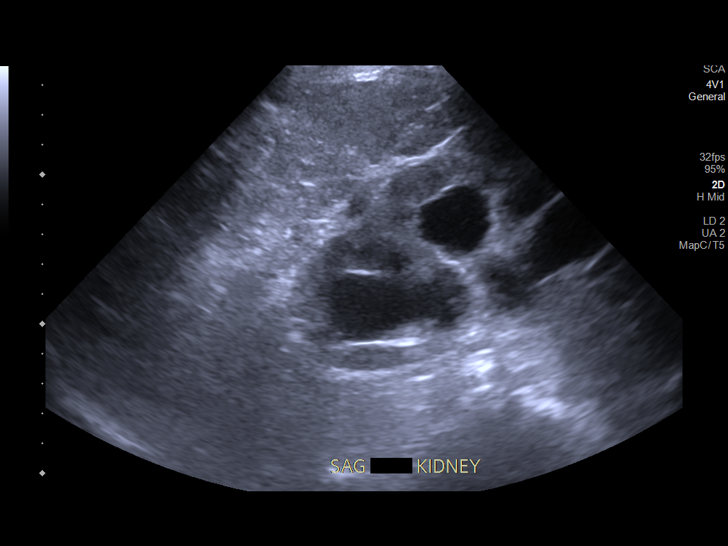
[im 37/37]
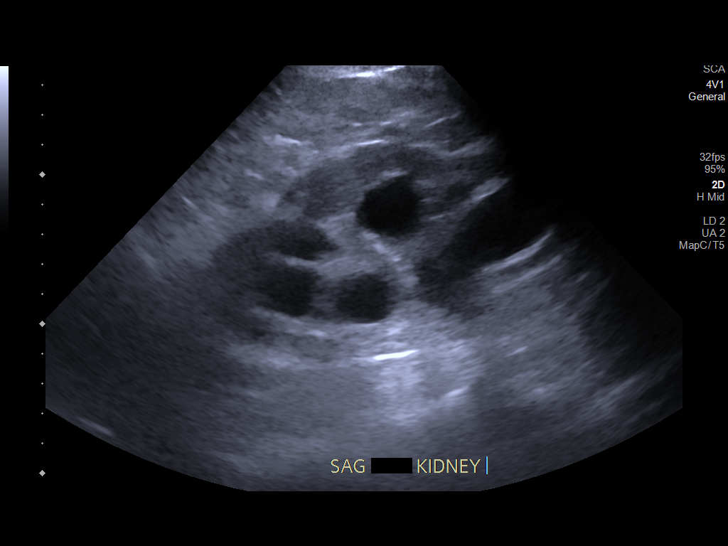

[14 of 25 positions shown; findings below may reference images not displayed]

FINDINGS: Right Kidney:

Renal measurements: 12.3 x 4.9 x 6.1 cm = volume: 198 mL. Moderate
hydronephrosis is noted. There is a 5.6 cm cyst identified similar
to that seen on prior CT examination.

Left Kidney:

Renal measurements: 11.7 x 5.8 x 5.6 cm = volume: 201 mL. Moderate
hydronephrosis is noted similar to that seen on the right.

Bladder:

Bladder is decompressed
IMPRESSION: Bilateral hydronephrosis is noted similar to that seen on prior CT
examination.

Right renal cyst stable in appearance.

## 2020-10-20 IMAGING — DX CHEST - 2 VIEW
2 series · 2 of 2 positions shown · non-contrast
Comparison: Prior studies including 09/14/2018

CLINICAL DATA: SOB,SWELLING IN LEGS,FLUID OVERLOAD,HX CHF,HTN,MI

EXAM:
CHEST - 2 VIEW

[chest lat]
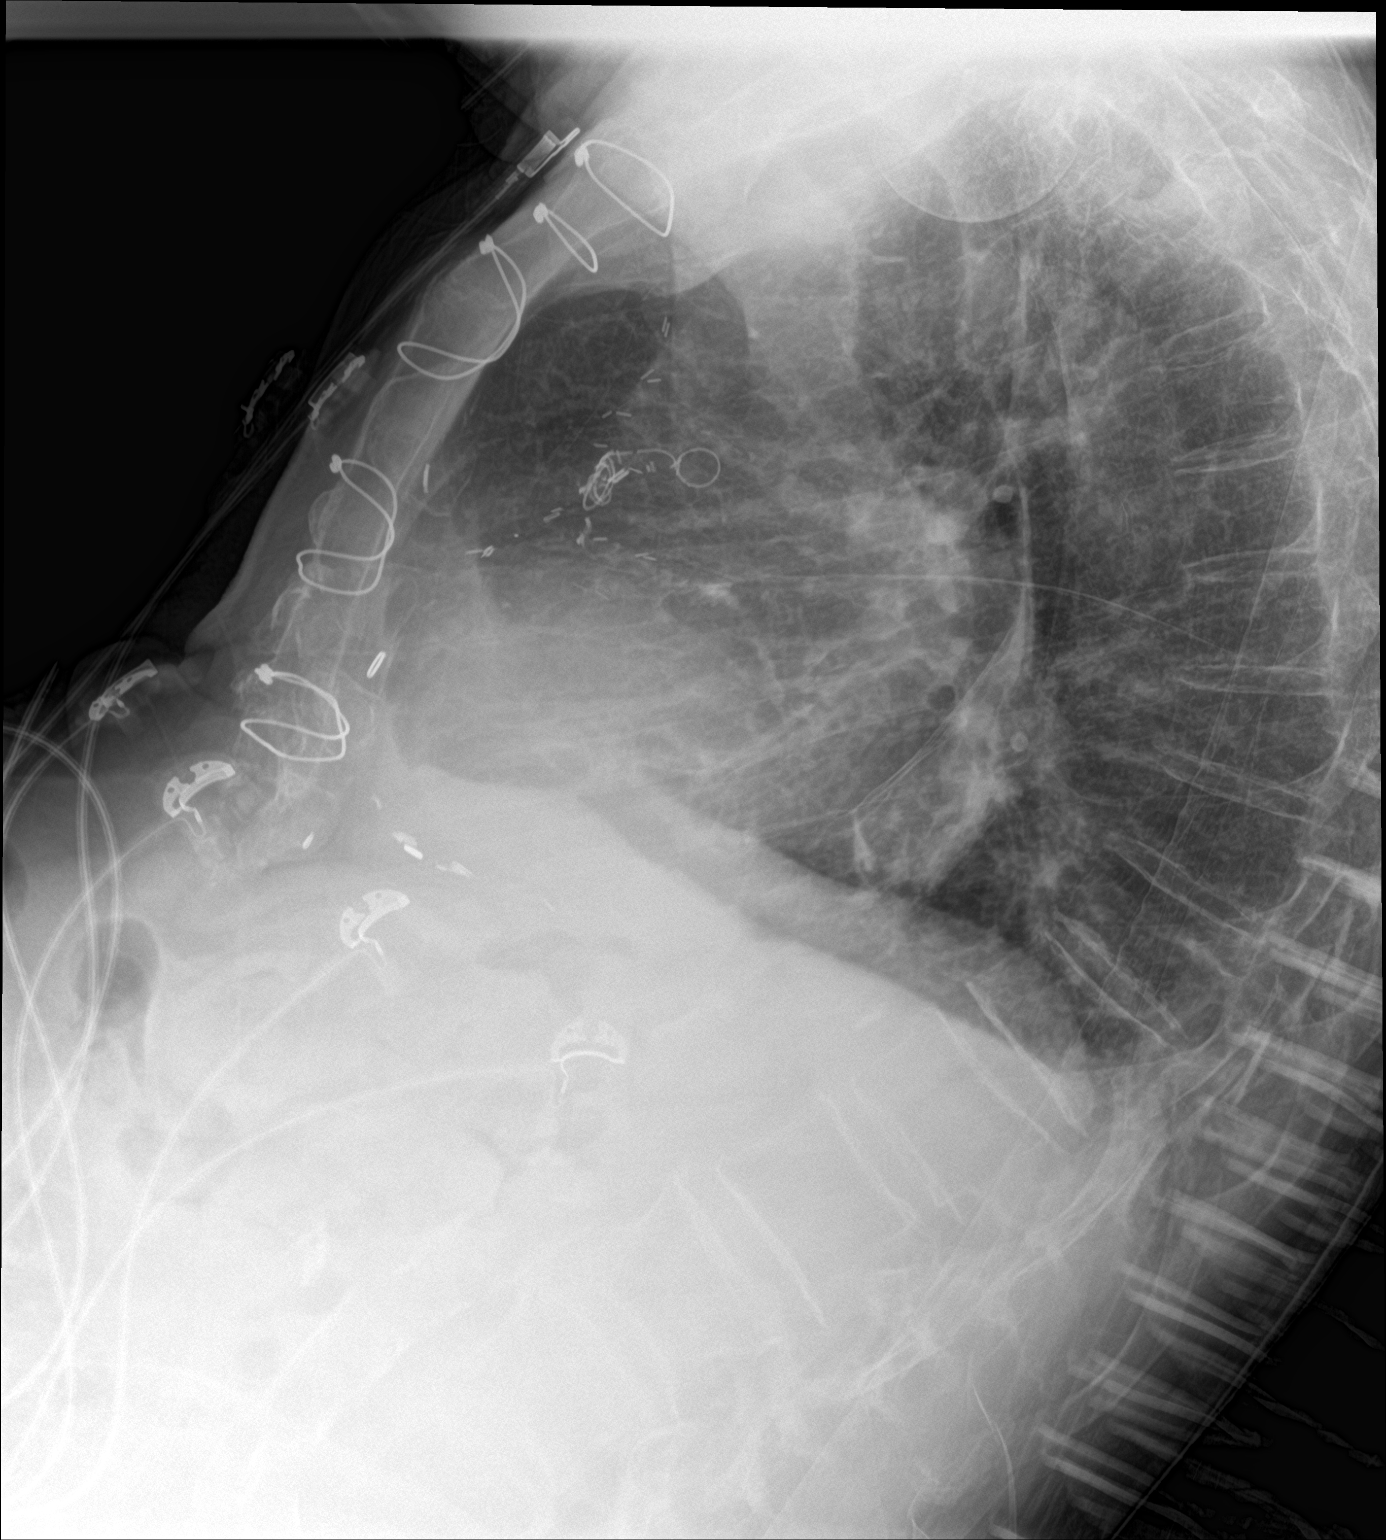

[chest ap]
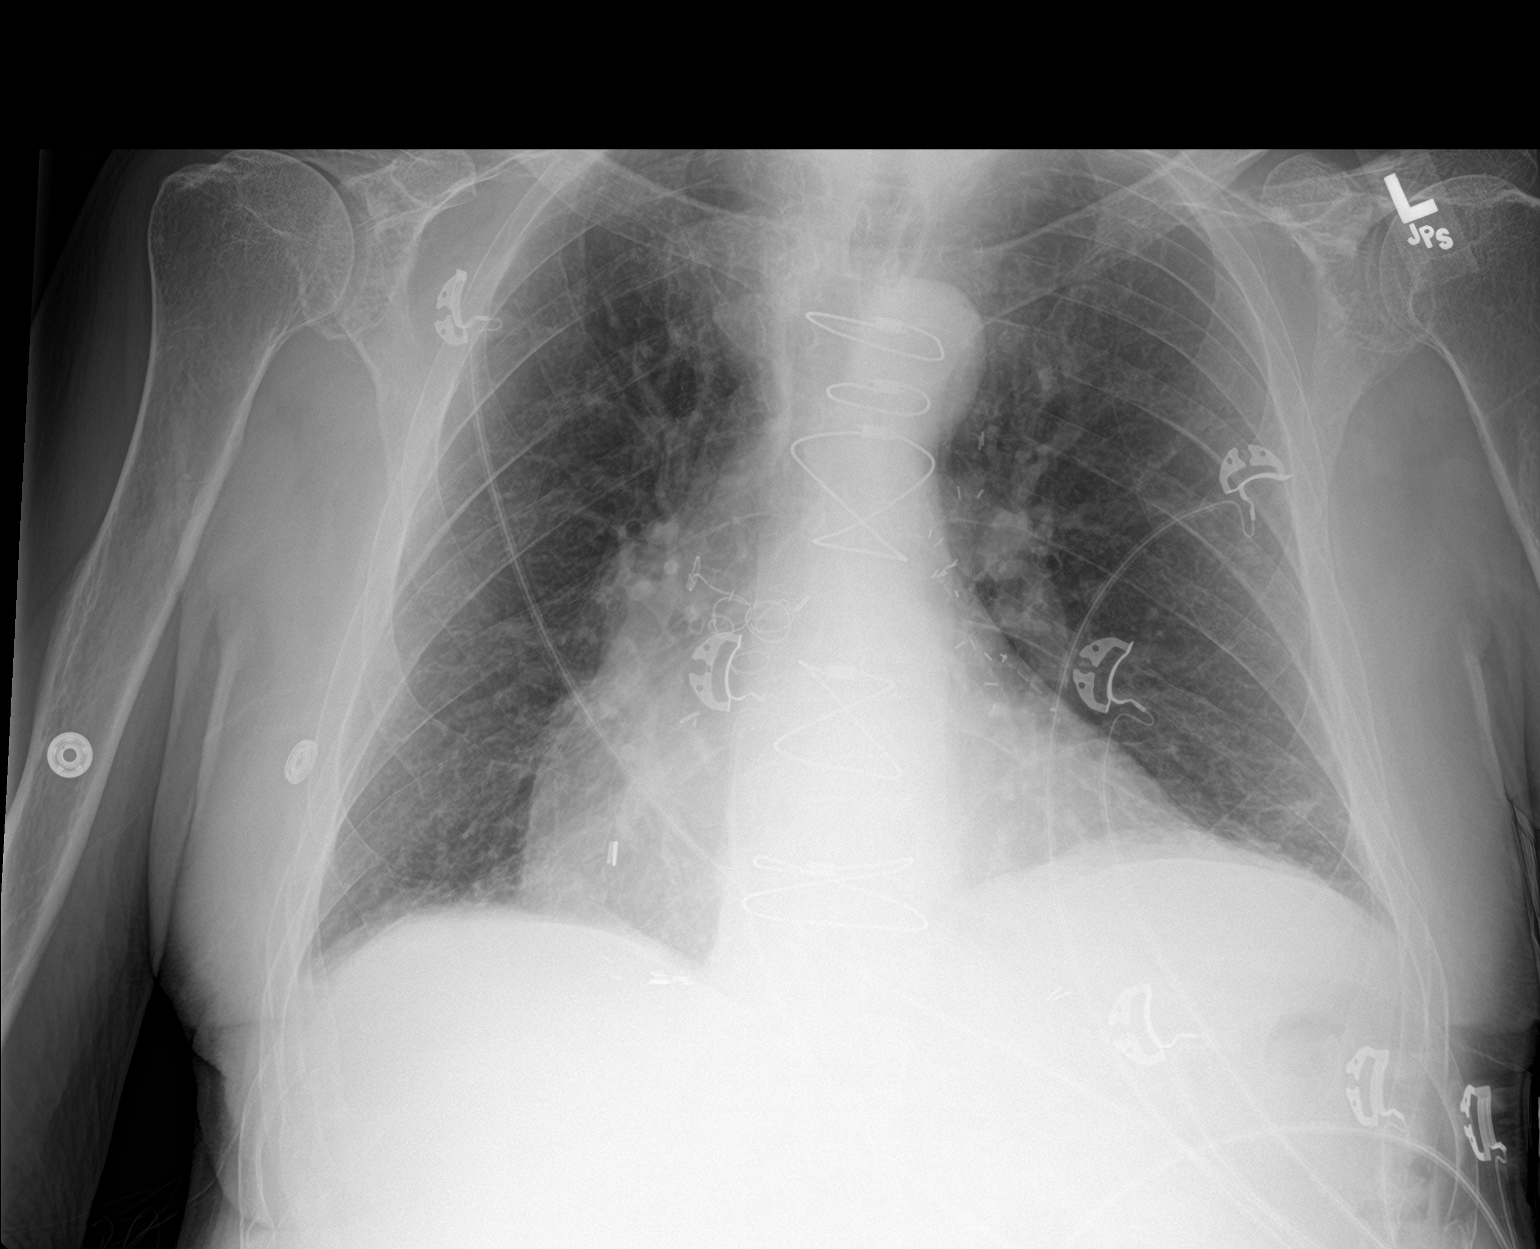

[2 of 2 positions shown; findings below may reference images not displayed]

FINDINGS: Median sternotomy and CABG. The heart is enlarged. There is shallow
lung inflation. Minimal atelectasis is identified at both lung
bases. There are no focal consolidations, pleural effusions, or
pulmonary edema. Numerous thoracic compression fractures.
IMPRESSION: 1. Cardiomegaly.
2. Bibasilar atelectasis.
3. Lungs otherwise clear.
